# Patient Record
Sex: Male | Born: 1950 | Race: White | Hispanic: No | Marital: Married | Smoking: Former smoker
Health system: Southern US, Community
[De-identification: ages and names within clinical notes are randomized; demographics above are authoritative.]

## PROBLEM LIST (undated history)

## (undated) DIAGNOSIS — Z860100 Personal history of colon polyps, unspecified: Secondary | ICD-10-CM

## (undated) DIAGNOSIS — I10 Essential (primary) hypertension: Secondary | ICD-10-CM

## (undated) DIAGNOSIS — F32A Depression, unspecified: Secondary | ICD-10-CM

## (undated) DIAGNOSIS — M1990 Unspecified osteoarthritis, unspecified site: Secondary | ICD-10-CM

## (undated) DIAGNOSIS — I2510 Atherosclerotic heart disease of native coronary artery without angina pectoris: Secondary | ICD-10-CM

## (undated) DIAGNOSIS — E785 Hyperlipidemia, unspecified: Secondary | ICD-10-CM

## (undated) DIAGNOSIS — F17200 Nicotine dependence, unspecified, uncomplicated: Secondary | ICD-10-CM

## (undated) DIAGNOSIS — J449 Chronic obstructive pulmonary disease, unspecified: Secondary | ICD-10-CM

## (undated) DIAGNOSIS — F329 Major depressive disorder, single episode, unspecified: Secondary | ICD-10-CM

## (undated) DIAGNOSIS — I219 Acute myocardial infarction, unspecified: Secondary | ICD-10-CM

## (undated) DIAGNOSIS — I499 Cardiac arrhythmia, unspecified: Secondary | ICD-10-CM

## (undated) DIAGNOSIS — F419 Anxiety disorder, unspecified: Secondary | ICD-10-CM

## (undated) DIAGNOSIS — N200 Calculus of kidney: Secondary | ICD-10-CM

## (undated) DIAGNOSIS — T7840XA Allergy, unspecified, initial encounter: Secondary | ICD-10-CM

## (undated) DIAGNOSIS — F1010 Alcohol abuse, uncomplicated: Secondary | ICD-10-CM

## (undated) DIAGNOSIS — K219 Gastro-esophageal reflux disease without esophagitis: Secondary | ICD-10-CM

## (undated) DIAGNOSIS — J84112 Idiopathic pulmonary fibrosis: Secondary | ICD-10-CM

## (undated) DIAGNOSIS — R0600 Dyspnea, unspecified: Secondary | ICD-10-CM

## (undated) DIAGNOSIS — Z87442 Personal history of urinary calculi: Secondary | ICD-10-CM

## (undated) DIAGNOSIS — I4891 Unspecified atrial fibrillation: Secondary | ICD-10-CM

## (undated) HISTORY — DX: Personal history of colonic polyps: Z86.010

## (undated) HISTORY — DX: Calculus of kidney: N20.0

## (undated) HISTORY — DX: Hyperlipidemia, unspecified: E78.5

## (undated) HISTORY — DX: Allergy, unspecified, initial encounter: T78.40XA

## (undated) HISTORY — PX: INGUINAL HERNIA REPAIR: SUR1180

## (undated) HISTORY — PX: BACK SURGERY: SHX140

## (undated) HISTORY — DX: Idiopathic pulmonary fibrosis: J84.112

## (undated) HISTORY — DX: Acute myocardial infarction, unspecified: I21.9

## (undated) HISTORY — DX: Personal history of colon polyps, unspecified: Z86.0100

## (undated) HISTORY — DX: Depression, unspecified: F32.A

## (undated) HISTORY — DX: Unspecified osteoarthritis, unspecified site: M19.90

## (undated) HISTORY — PX: CARDIAC ELECTROPHYSIOLOGY STUDY AND ABLATION: SHX1294

## (undated) HISTORY — DX: Atherosclerotic heart disease of native coronary artery without angina pectoris: I25.10

## (undated) HISTORY — DX: Gastro-esophageal reflux disease without esophagitis: K21.9

## (undated) HISTORY — DX: Major depressive disorder, single episode, unspecified: F32.9

## (undated) HISTORY — DX: Anxiety disorder, unspecified: F41.9

## (undated) HISTORY — DX: Chronic obstructive pulmonary disease, unspecified: J44.9

## (undated) HISTORY — DX: Essential (primary) hypertension: I10

## (undated) HISTORY — DX: Unspecified atrial fibrillation: I48.91

---
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
## 2010-03-22 HISTORY — PX: CORONARY STENT PLACEMENT: SHX1402

## 2013-09-03 DIAGNOSIS — M7500 Adhesive capsulitis of unspecified shoulder: Secondary | ICD-10-CM | POA: Insufficient documentation

## 2013-09-03 DIAGNOSIS — F32A Depression, unspecified: Secondary | ICD-10-CM

## 2013-09-03 DIAGNOSIS — E7800 Pure hypercholesterolemia, unspecified: Secondary | ICD-10-CM

## 2013-09-03 DIAGNOSIS — F329 Major depressive disorder, single episode, unspecified: Secondary | ICD-10-CM | POA: Insufficient documentation

## 2013-09-03 DIAGNOSIS — D751 Secondary polycythemia: Secondary | ICD-10-CM | POA: Insufficient documentation

## 2013-09-03 DIAGNOSIS — M542 Cervicalgia: Secondary | ICD-10-CM

## 2013-09-03 DIAGNOSIS — K219 Gastro-esophageal reflux disease without esophagitis: Secondary | ICD-10-CM

## 2013-09-03 DIAGNOSIS — J301 Allergic rhinitis due to pollen: Secondary | ICD-10-CM | POA: Insufficient documentation

## 2013-09-03 DIAGNOSIS — Z01810 Encounter for preprocedural cardiovascular examination: Secondary | ICD-10-CM | POA: Insufficient documentation

## 2013-09-03 DIAGNOSIS — M5030 Other cervical disc degeneration, unspecified cervical region: Secondary | ICD-10-CM | POA: Insufficient documentation

## 2013-09-03 DIAGNOSIS — Z125 Encounter for screening for malignant neoplasm of prostate: Secondary | ICD-10-CM | POA: Insufficient documentation

## 2013-09-03 HISTORY — DX: Allergic rhinitis due to pollen: J30.1

## 2013-09-03 HISTORY — DX: Encounter for screening for malignant neoplasm of prostate: Z12.5

## 2013-09-03 HISTORY — DX: Secondary polycythemia: D75.1

## 2013-09-03 HISTORY — DX: Gastro-esophageal reflux disease without esophagitis: K21.9

## 2013-09-03 HISTORY — DX: Depression, unspecified: F32.A

## 2013-09-03 HISTORY — DX: Pure hypercholesterolemia, unspecified: E78.00

## 2013-09-03 HISTORY — DX: Cervicalgia: M54.2

## 2013-09-03 HISTORY — DX: Other cervical disc degeneration, unspecified cervical region: M50.30

## 2013-09-03 HISTORY — DX: Adhesive capsulitis of unspecified shoulder: M75.00

## 2013-09-03 HISTORY — DX: Major depressive disorder, single episode, unspecified: F32.9

## 2015-03-23 HISTORY — PX: CARDIOVERSION: SHX1299

## 2015-03-23 HISTORY — PX: CATARACT EXTRACTION: SUR2

## 2015-06-26 DIAGNOSIS — R079 Chest pain, unspecified: Secondary | ICD-10-CM

## 2015-06-26 DIAGNOSIS — J449 Chronic obstructive pulmonary disease, unspecified: Secondary | ICD-10-CM | POA: Insufficient documentation

## 2015-06-26 DIAGNOSIS — I4892 Unspecified atrial flutter: Secondary | ICD-10-CM | POA: Insufficient documentation

## 2015-06-26 HISTORY — DX: Chest pain, unspecified: R07.9

## 2015-06-26 HISTORY — DX: Unspecified atrial flutter: I48.92

## 2015-08-05 DIAGNOSIS — G4733 Obstructive sleep apnea (adult) (pediatric): Secondary | ICD-10-CM | POA: Insufficient documentation

## 2015-08-05 HISTORY — DX: Obstructive sleep apnea (adult) (pediatric): G47.33

## 2015-10-21 HISTORY — PX: ATRIAL FIBRILLATION ABLATION: EP1191

## 2015-11-04 DIAGNOSIS — G4761 Periodic limb movement disorder: Secondary | ICD-10-CM

## 2015-11-04 HISTORY — DX: Periodic limb movement disorder: G47.61

## 2016-03-22 HISTORY — PX: VENTRAL HERNIA REPAIR: SHX424

## 2016-03-22 HISTORY — PX: COLONOSCOPY: SHX174

## 2016-04-05 HISTORY — PX: LUMBAR LAMINECTOMY: SHX95

## 2016-07-01 DIAGNOSIS — Z7901 Long term (current) use of anticoagulants: Secondary | ICD-10-CM | POA: Insufficient documentation

## 2016-07-01 DIAGNOSIS — Z9229 Personal history of other drug therapy: Secondary | ICD-10-CM | POA: Insufficient documentation

## 2016-07-01 HISTORY — DX: Personal history of other drug therapy: Z92.29

## 2016-07-01 HISTORY — DX: Long term (current) use of anticoagulants: Z79.01

## 2017-01-27 DIAGNOSIS — R002 Palpitations: Secondary | ICD-10-CM | POA: Insufficient documentation

## 2017-01-27 HISTORY — DX: Palpitations: R00.2

## 2017-10-28 DIAGNOSIS — R42 Dizziness and giddiness: Secondary | ICD-10-CM

## 2017-10-28 DIAGNOSIS — W19XXXA Unspecified fall, initial encounter: Secondary | ICD-10-CM

## 2017-10-28 DIAGNOSIS — R296 Repeated falls: Secondary | ICD-10-CM

## 2017-10-28 HISTORY — DX: Unspecified fall, initial encounter: W19.XXXA

## 2017-10-28 HISTORY — DX: Dizziness and giddiness: R42

## 2017-10-28 HISTORY — DX: Repeated falls: R29.6

## 2018-06-22 ENCOUNTER — Telehealth: Payer: Self-pay

## 2018-06-22 NOTE — Telephone Encounter (Addendum)
Advised patient we are doing Web visits at this time. Patient agrees states he prefers not to come out at this time so a web visit would work fine. Faxed records request to former PCP. Appointment scheduled.

## 2018-06-22 NOTE — Telephone Encounter (Signed)
Copied from [place] [id number]. Topic: General - Other >> Jun 21, 2018 11:14 AM Ivar Drape wrote: Reason for CRM:  Patient would like a new patient appt and he is on the following medications:  1) Lexapro  2) Rexulti  3) Zoloft  4) [place]

## 2018-06-22 NOTE — Telephone Encounter (Signed)
Called patient regarding ne patient appointment. Patient recently moved from [facility] and was followed by Dr. Pia Mau at [facility]. Wife states she would like release of records form mailed to them so that they will not have to come out. Per wife patient has Hx of COPD,and A-Fib. He is not on O2 at this time. Was being seen by Cardiologist in [facility] and will be establishing care with a Cardiologist in this area.

## 2018-06-22 NOTE — Telephone Encounter (Signed)
I can see new pt but has to be virtual via web ex or facetime. If can't do that then need to get established later when pandemic situaion is resolved.Marland Kitchen Hopefully family can get them set up. Also want them to check blood pressure and pulse day of or day before virtual visit.    Explain virtual., bp reading and pulse reading important with new pt and his history.

## 2018-06-26 ENCOUNTER — Telehealth: Payer: Self-pay | Admitting: *Deleted

## 2018-06-26 NOTE — Telephone Encounter (Signed)
Received Medical records from Dr. Jenny Reichmann [facility]; forwarded to provider/SLS 04/06

## 2018-06-26 NOTE — Telephone Encounter (Signed)
Received Medical records from Dr. Jenny Reichmann [facility]; forwarded to provider/SLS 04/06

## 2018-07-03 ENCOUNTER — Encounter: Payer: Self-pay | Admitting: Medical

## 2018-07-03 ENCOUNTER — Other Ambulatory Visit: Payer: Self-pay

## 2018-07-03 ENCOUNTER — Ambulatory Visit (INDEPENDENT_AMBULATORY_CARE_PROVIDER_SITE_OTHER): Payer: Medicare Other | Admitting: Medical

## 2018-07-03 VITALS — BP 141/82 | HR 98

## 2018-07-03 DIAGNOSIS — E785 Hyperlipidemia, unspecified: Secondary | ICD-10-CM

## 2018-07-03 DIAGNOSIS — F17200 Nicotine dependence, unspecified, uncomplicated: Secondary | ICD-10-CM | POA: Diagnosis not present

## 2018-07-03 DIAGNOSIS — J301 Allergic rhinitis due to pollen: Secondary | ICD-10-CM

## 2018-07-03 DIAGNOSIS — Z8679 Personal history of other diseases of the circulatory system: Secondary | ICD-10-CM

## 2018-07-03 DIAGNOSIS — F329 Major depressive disorder, single episode, unspecified: Secondary | ICD-10-CM

## 2018-07-03 DIAGNOSIS — F32A Depression, unspecified: Secondary | ICD-10-CM

## 2018-07-03 NOTE — Patient Instructions (Addendum)
For history of atrial flutter, recommend that you continue with diltiazem.  Also went ahead and made referral to cardiologist.  This referral was placed in a routine manner so I think 3 to 4 months would be reasonable particularly in light of the viral pandemic situation.  For history of smoking, would recommend that you try to cut back.  Unfortunately nicotine patches did not help and you continue despite use of Wellbutrin which oftentimes does help patients to quit smoking.  However keep trying would recommend at least reducing to maybe half a pack a day/tapering down.  For high cholesterol, continue current statin.  Also low-cholesterol diet.  For history of allergic rhinitis, continue with Allegra.  If you find a letter current is not adequate enough then can add steroid nasal spray such as Flonase over-the-counter.  Blood pressure is a little high today.  Would asked that you check your blood pressure and pulse twice daily then MyChart me those readings.  We will see if you need doses change or add on medication.  Follow up date to be determined.

## 2018-07-03 NOTE — Progress Notes (Addendum)
   Subjective:    Patient ID: Robert Lang, male    DOB: 02/08/1951, 68 y.o.   MRN: [id number]  HPI  Virtual Visit via Video Note  I connected with [name] on 07/03/18 at  9:20 AM EDT by a video enabled telemedicine application and verified that I am speaking with the correct person using two identifiers.   I discussed the limitations of evaluation and management by telemedicine and the availability of in person appointments. The patient expressed understanding and agreed to proceed.   History of Present Illness:   Pt states trying to get some daily exercise but some balance issues. Pt smokes a pack a day. Stopped 10 years and then restarted back. 2 beers a day. Relocated to get close to children.  Pt has hx of high cholesterol in past. He states his last labs were controlled 2 months ago.  Pt has hx of atrial  flutter in the past. Hx of ablation in August 2017. Pt was seeing Dr. Pia Mau [facility].  Pt is has hx of depression. On wellbutrin 300 mg daily. Pt is on lexapro 20 mg daily. Not on cymblata. Pt is no longer on clonzepam.  Hx of allergies this time of year. He takes Human resources officer. He states controlls his allergies.          Observations/Objective: General-no acute distress, pleasant, oriented. Lungs- on inspection lungs appear unlabored. Neck- no tracheal deviation or jvd on inspection. Neuro- gross motor function appears intact.  Assessment and Plan: For history of atrial flutter, recommend that you continue with diltiazem.  Also went ahead and made referral to cardiologist.  This referral was placed in a routine manner so I think 3 to 4 months would be reasonable particularly in light of the viral pandemic situation.  For history of smoking, would recommend that you try to cut back.  Unfortunately nicotine patches did not help and you continue despite use of Wellbutrin which oftentimes does help patients to quit smoking.  However keep trying would  recommend at least reducing to maybe half a pack a day/tapering down.  For high cholesterol, continue current statin.  Also low-cholesterol diet.  For history of allergic rhinitis, continue with Allegra.  If you find a letter current is not adequate enough then can add steroid nasal spray such as Flonase over-the-counter.  Blood pressure is a little high today.  Would asked that you check your blood pressure and pulse twice daily then MyChart me those readings.  We will see if you need doses change or add on medication.   Follow up date to be determined. Follow Up Instructions:    I discussed the assessment and treatment plan with the patient. The patient was provided an opportunity to ask questions and all were answered. The patient agreed with the plan and demonstrated an understanding of the instructions.   The patient was advised to call back or seek an in-person evaluation if the symptoms worsen or if the condition fails to improve as anticipated.  I provided 30 minutes of non-face-to-face time during this encounter.   Mackie Pai, [name]    Review of Systems     Objective:   Physical Exam       Assessment & Plan:

## 2018-07-04 ENCOUNTER — Telehealth: Payer: Self-pay | Admitting: Cardiology

## 2018-07-04 NOTE — Telephone Encounter (Signed)
YOUR CARDIOLOGY TEAM HAS ARRANGED FOR AN E-VISIT FOR YOUR APPOINTMENT - PLEASE REVIEW IMPORTANT INFORMATION BELOW SEVERAL DAYS PRIOR TO YOUR APPOINTMENT  Due to the recent COVID-19 pandemic, we are transitioning in-person office visits to tele-medicine visits in an effort to decrease unnecessary exposure to our patients and staff. Medicare and most insurances are covering these visits without a copay needed. We also encourage you to sign up for MyChart if you have not already done so. You will need a smartphone if possible. For patients that do not have this, we can still complete the visit using a regular telephone but do prefer a smartphone to enable video when possible. You may have a close family member that lives with you that can help. If possible, we also ask that you have a blood pressure cuff and scale at home to measure your blood pressure, heart rate and weight prior to your scheduled appointment. Patients with clinical needs that need an in-person evaluation and testing will still be able to come to the office if absolutely necessary. If you have any questions, feel free to call our office.    CONSENT FOR TELE-HEALTH VISIT - PLEASE REVIEW  I hereby voluntarily request, consent and authorize [facility] and its employed or contracted physicians, physician assistants, nurse practitioners or other licensed health care professionals (the Practitioner), to provide me with telemedicine health care services (the Services") as deemed necessary by the treating Practitioner. I acknowledge and consent to receive the Services by the Practitioner via telemedicine. I understand that the telemedicine visit will involve communicating with the Practitioner through live audiovisual communication technology and the disclosure of certain medical information by electronic transmission. I acknowledge that I have been given the opportunity to request an in-person assessment or other available alternative prior to  the telemedicine visit and am voluntarily participating in the telemedicine visit.  I understand that I have the right to withhold or withdraw my consent to the use of telemedicine in the course of my care at any time, without affecting my right to future care or treatment, and that the Practitioner or I may terminate the telemedicine visit at any time. I understand that I have the right to inspect all information obtained and/or recorded in the course of the telemedicine visit and may receive copies of available information for a reasonable fee.  I understand that some of the potential risks of receiving the Services via telemedicine include:   Delay or interruption in medical evaluation due to technological equipment failure or disruption;  Information transmitted may not be sufficient (e.g. poor resolution of images) to allow for appropriate medical decision making by the Practitioner; and/or   In rare instances, security protocols could fail, causing a breach of personal health information.  Furthermore, I acknowledge that it is my responsibility to provide information about my medical history, conditions and care that is complete and accurate to the best of my ability. I acknowledge that Practitioner's advice, recommendations, and/or decision may be based on factors not within their control, such as incomplete or inaccurate data provided by me or distortions of diagnostic images or specimens that may result from electronic transmissions. I understand that the practice of medicine is not an exact science and that Practitioner makes no warranties or guarantees regarding treatment outcomes. I acknowledge that I will receive a copy of this consent concurrently upon execution via email to the email address I last provided but may also request a printed copy by calling the office of [place].  I understand that my insurance will be billed for this visit.   I have read or had this consent read to  me.  I understand the contents of this consent, which adequately explains the benefits and risks of the Services being provided via telemedicine.   I have been provided ample opportunity to ask questions regarding this consent and the Services and have had my questions answered to my satisfaction.  I give my informed consent for the services to be provided through the use of telemedicine in my medical care  By participating in this telemedicine visit I agree to the above.  Patient agrees to verbal consent for [email or web address].me meeting 07/04/2018 pp

## 2018-07-06 ENCOUNTER — Encounter: Payer: Self-pay | Admitting: Cardiology

## 2018-07-06 ENCOUNTER — Encounter: Payer: Self-pay | Admitting: Medical

## 2018-07-06 ENCOUNTER — Other Ambulatory Visit: Payer: Self-pay

## 2018-07-06 ENCOUNTER — Telehealth (INDEPENDENT_AMBULATORY_CARE_PROVIDER_SITE_OTHER): Payer: Medicare Other | Admitting: Cardiology

## 2018-07-06 DIAGNOSIS — Z9889 Other specified postprocedural states: Principal | ICD-10-CM

## 2018-07-06 DIAGNOSIS — G629 Polyneuropathy, unspecified: Secondary | ICD-10-CM

## 2018-07-06 DIAGNOSIS — I10 Essential (primary) hypertension: Secondary | ICD-10-CM

## 2018-07-06 DIAGNOSIS — I25110 Atherosclerotic heart disease of native coronary artery with unstable angina pectoris: Secondary | ICD-10-CM | POA: Insufficient documentation

## 2018-07-06 DIAGNOSIS — E785 Hyperlipidemia, unspecified: Secondary | ICD-10-CM

## 2018-07-06 DIAGNOSIS — F17210 Nicotine dependence, cigarettes, uncomplicated: Secondary | ICD-10-CM

## 2018-07-06 DIAGNOSIS — F17200 Nicotine dependence, unspecified, uncomplicated: Secondary | ICD-10-CM

## 2018-07-06 DIAGNOSIS — I2510 Atherosclerotic heart disease of native coronary artery without angina pectoris: Secondary | ICD-10-CM

## 2018-07-06 DIAGNOSIS — Z8679 Personal history of other diseases of the circulatory system: Secondary | ICD-10-CM

## 2018-07-06 HISTORY — DX: Personal history of other diseases of the circulatory system: Z86.79

## 2018-07-06 HISTORY — DX: Hyperlipidemia, unspecified: E78.5

## 2018-07-06 HISTORY — DX: Nicotine dependence, unspecified, uncomplicated: F17.200

## 2018-07-06 HISTORY — DX: Essential (primary) hypertension: I10

## 2018-07-06 HISTORY — DX: Polyneuropathy, unspecified: G62.9

## 2018-07-06 HISTORY — DX: Other specified postprocedural states: Z98.890

## 2018-07-06 HISTORY — DX: Atherosclerotic heart disease of native coronary artery without angina pectoris: I25.10

## 2018-07-06 NOTE — Addendum Note (Signed)
Addended by: Ashok Norris on: 07/06/2018 02:56 PM   Modules accepted: Orders

## 2018-07-06 NOTE — Progress Notes (Signed)
Virtual Visit via Video Note   This visit type was conducted due to national recommendations for restrictions regarding the COVID-19 Pandemic (e.g. social distancing) in an effort to limit this patient's exposure and mitigate transmission in our community.  Due to his co-morbid illnesses, this patient is at least at moderate risk for complications without adequate follow up.  This format is felt to be most appropriate for this patient at this time.  All issues noted in this document were discussed and addressed.  A limited physical exam was performed with this format.  Please refer to the patient's chart for his consent to telehealth for [facility].  Evaluation Performed:  Follow-up visit  This visit type was conducted due to national recommendations for restrictions regarding the COVID-19 Pandemic (e.g. social distancing).  This format is felt to be most appropriate for this patient at this time.  All issues noted in this document were discussed and addressed.  No physical exam was performed (except for noted visual exam findings with Video Visits).  Please refer to the patient's chart (MyChart message for video visits and phone note for telephone visits) for the patient's consent to telehealth for [facility].  Date:  07/06/2018  ID: Robert Lang, DOB 12-May-1950, MRN [id number]   Patient Location:  [place] [address]   Provider location:   [place] Office  PCP:  Mackie Pai, [place]  Cardiologist:  Jenne Campus, MD     Chief Complaint: I would like to be established as a patient  History of Present Illness:    Robert Lang is a 68 y.o. male  who presents via audio/video conferencing for a telehealth visit today.  His past medical history significant for atrial flutter.  He was asymptomatic when he got flutter his symptoms were fatigue and tiredness.  Eventually in 2017 he had atrial flutter ablation.  He has been anticoagulated  since that time.  He did wear some monitors after that which did not show any evidence of atrial flutter or atrial fibrillation.  At that time he had cardiac catheterization.  He did not have any chest pain tightness squeezing pressure burning chest it was done as part of evaluation before he is atrial flutter ablation.  He is doing well overall he is able to walk climb stairs with no major difficulties he is trying to walk on a regular basis he does it 3 times a day.  He gets somewhat tired however no unusual symptoms.  No chest pain while exercising.  Sadly he continues to smoke apparently quit for 10 years and then he went back to smoking he also drink about 3-4 beers every single day.  He does not have family history of premature coronary artery disease he does have hypertension no diabetes he does have neuropathy origin of which is unclear.  He is taking some medication which helped to some degree but he got difficulty sleeping at night because of neuropathy.  Does not have history of CVA or TIA.   The patient does not have symptoms concerning for COVID-19 infection (fever, chills, cough, or new SHORTNESS OF BREATH).    Prior CV studies:   The following studies were reviewed today:  Cardiac catheterization from 2017 reviewed which showed 90% narrowing of the small diagonal branch.  Rest show luminal irregularities.     Past Medical History:  Diagnosis Date  . Allergy   . Arthritis   . Depression   . Hyperlipidemia   .  Hypertension     Past Surgical History:  Procedure Laterality Date  . CARDIAC ELECTROPHYSIOLOGY STUDY AND ABLATION       Current Meds  Medication Sig  . atorvastatin (LIPITOR) 10 MG tablet Take by mouth.  Marland Kitchen buPROPion (WELLBUTRIN) 100 MG tablet Take by mouth.  . Cholecalciferol (VITAMIN D-1000 MAX ST) 25 MCG (1000 UT) tablet Take by mouth.  . clonazePAM (KLONOPIN) 0.5 MG tablet TK 1 T PO TID PRN  . Difluprednate (DUREZOL) 0.05 % EMUL   . diltiazem (CARDIZEM CD)  180 MG 24 hr capsule Take by mouth.  . DULoxetine (CYMBALTA) 60 MG capsule TK 1 C PO D  . enalapril (VASOTEC) 10 MG tablet Take by mouth.  . enalapril-hydrochlorothiazide (VASERETIC) 10-25 MG tablet Take by mouth.  . escitalopram (LEXAPRO) 10 MG tablet Take by mouth.  . fexofenadine (ALLEGRA) 180 MG tablet Take by mouth.  . finasteride (PROSCAR) 5 MG tablet TK 1 T PO QD  . gabapentin (NEURONTIN) 100 MG capsule Take by mouth.  Marland Kitchen ketorolac (ACULAR) 0.5 % ophthalmic solution   . Melatonin (MELATONIN MAXIMUM STRENGTH) 5 MG TABS Take by mouth.  . meloxicam (MOBIC) 7.5 MG tablet Take by mouth.  . mirabegron ER (MYRBETRIQ) 25 MG TB24 tablet Take by mouth.  . nitroGLYCERIN (NITROSTAT) 0.4 MG SL tablet Place under the tongue.  Marland Kitchen REXULTI 1 MG TABS TAKE 1 TABLET PO QHS  . rivaroxaban (XARELTO) 20 MG TABS tablet Take by mouth.  . sertraline (ZOLOFT) 100 MG tablet       Family History: The patient's family history is not on file.   ROS:   Please see the history of present illness.     All other systems reviewed and are negative.   Labs/Other Tests and Data Reviewed:     Recent Labs: No results found for requested labs within last 8760 hours.  Recent Lipid Panel No results found for: CHOL, TRIG, HDL, CHOLHDL, VLDL, LDLCALC, LDLDIRECT    Exam:    Vital Signs:  BP 138/78   Pulse 74   Ht 5\' 11"  (1.803 m)   Wt 190 lb (86.2 kg)   BMI 26.50 kg/m     Wt Readings from Last 3 Encounters:  07/06/18 190 lb (86.2 kg)     Well nourished, well developed male in no acute distress. Alert awake oriented x3.  He is talking to me from his townhouse that he just recently moved into.  He is denies having any issues there is no JVD.  There is no swelling of lower extremities he is quite cheerful and actually enjoyed conversation with him a lot.  We used video link for this visit.  Diagnosis for this visit:   1. Status post ablation of atrial flutter   2. Coronary artery disease involving  native coronary artery of native heart without angina pectoris   3. Essential hypertension   4. Dyslipidemia   5. Smoking   6. Neuropathy      ASSESSMENT & PLAN:    1.  Status post ablation of atrial flutter denies having any palpitations recent monitor was done showed no evidence of recurrence of the problem.  Still because of his high chads 2 Vascor he is anticoagulated which I will continue. 2.  Coronary artery disease stable on appropriate medication I told him we need to modify his risk factors for coronary artery disease.  We will get in touch with his primary care physician to get his fasting lipid profile.  I stressed importance  of quitting smoking. 3.  Essential hypertension his blood pressure usually is between 1 44-3 40 systolic.  I told him this is appropriate he described 1 situation that his blood pressure dropped to 101/60 I told him if that happens again he need to take some extra fluids.  Other than that we will continue present management. 4.  Smoking we had a long discussion with him regarding that issue and I strongly recommend to quit. 5.  Neuropathy.  Is being managed by primary care physician.  Appears to be fairly controlled. 6.  Dyslipidemia we will continue with statin right now will get copy of fasting lipid profile from his primary care physician. 7.  High risk for atherosclerosis everywhere.  I will schedule him to have carotid ultrasounds to rule out possibility of problem there.  COVID-19 Education: The signs and symptoms of COVID-19 were discussed with the patient and how to seek care for testing (follow up with PCP or arrange E-visit).  The importance of social distancing was discussed today.  Patient Risk:   After full review of this patients clinical status, I feel that they are at least moderate risk at this time.  Time:   Today, I have spent 25 minutes with the patient with telehealth technology discussing pt health issues. Visit was finished at 2:44 PM.     Medication Adjustments/Labs and Tests Ordered: Current medicines are reviewed at length with the patient today.  Concerns regarding medicines are outlined above.  No orders of the defined types were placed in this encounter.  Medication changes: No orders of the defined types were placed in this encounter.    Disposition: 3 months follow-up in the meantime we will schedule him to have carotid ultrasounds.  Daily level 2 as well as echocardiogram with acuity level 3  Signed, Park Liter, MD, [facility] 07/06/2018 2:42 PM    [facility]

## 2018-07-06 NOTE — Patient Instructions (Signed)
Medication Instructions:  Your physician recommends that you continue on your current medications as directed. Please refer to the Current Medication list given to you today.  If you need a refill on your cardiac medications before your next appointment, please call your pharmacy.   Lab work: None.  If you have labs (blood work) drawn today and your tests are completely normal, you will receive your results only by: Marland Kitchen MyChart Message (if you have MyChart) OR . A paper copy in the mail If you have any lab test that is abnormal or we need to change your treatment, we will call you to review the results.  Testing/Procedures: Your physician has requested that you have a carotid duplex. This test is an ultrasound of the carotid arteries in your neck. It looks at blood flow through these arteries that supply the brain with blood. Allow one hour for this exam. There are no restrictions or special instructions.  Your physician has requested that you have an echocardiogram. Echocardiography is a painless test that uses sound waves to create images of your heart. It provides your doctor with information about the size and shape of your heart and how well your heart's chambers and valves are working. This procedure takes approximately one hour. There are no restrictions for this procedure.    Follow-Up: At [facility], you and your health needs are our priority.  As part of our continuing mission to provide you with exceptional heart care, we have created designated Provider Care Teams.  These Care Teams include your primary Cardiologist (physician) and Advanced Practice Providers (APPs -  Physician Assistants and Nurse Practitioners) who all work together to provide you with the care you need, when you need it. You will need a follow up appointment in 3 months.  Please call our office 2 months in advance to schedule this appointment.  You may see No primary care provider on file. or another member of  our [employer] Provider Team in [place]: Shirlee More, MD . Jyl Heinz, MD  Any Other Special Instructions Will Be Listed Below (If Applicable).     Echocardiogram An echocardiogram is a procedure that uses painless sound waves (ultrasound) to produce an image of the heart. Images from an echocardiogram can provide important information about:  Signs of coronary artery disease (CAD).  Aneurysm detection. An aneurysm is a weak or damaged part of an artery wall that bulges out from the normal force of blood pumping through the body.  Heart size and shape. Changes in the size or shape of the heart can be associated with certain conditions, including heart failure, aneurysm, and CAD.  Heart muscle function.  Heart valve function.  Signs of a past heart attack.  Fluid buildup around the heart.  Thickening of the heart muscle.  A tumor or infectious growth around the heart valves. Tell a health care provider about:  Any allergies you have.  All medicines you are taking, including vitamins, herbs, eye drops, creams, and over-the-counter medicines.  Any blood disorders you have.  Any surgeries you have had.  Any medical conditions you have.  Whether you are pregnant or may be pregnant. What are the risks? Generally, this is a safe procedure. However, problems may occur, including:  Allergic reaction to dye (contrast) that may be used during the procedure. What happens before the procedure? No specific preparation is needed. You may eat and drink normally. What happens during the procedure?   An IV tube may be inserted into  one of your veins.  You may receive contrast through this tube. A contrast is an injection that improves the quality of the pictures from your heart.  A gel will be applied to your chest.  A wand-like tool (transducer) will be moved over your chest. The gel will help to transmit the sound waves from the transducer.  The sound waves will  harmlessly bounce off of your heart to allow the heart images to be captured in real-time motion. The images will be recorded on a computer. The procedure may vary among health care providers and hospitals. What happens after the procedure?  You may return to your normal, everyday life, including diet, activities, and medicines, unless your health care provider tells you not to do that. Summary  An echocardiogram is a procedure that uses painless sound waves (ultrasound) to produce an image of the heart.  Images from an echocardiogram can provide important information about the size and shape of your heart, heart muscle function, heart valve function, and fluid buildup around your heart.  You do not need to do anything to prepare before this procedure. You may eat and drink normally.  After the echocardiogram is completed, you may return to your normal, everyday life, unless your health care provider tells you not to do that. This information is not intended to replace advice given to you by your health care provider. Make sure you discuss any questions you have with your health care provider. Document Released: 03/05/2000 Document Revised: 04/10/2016 Document Reviewed: 04/10/2016 Elsevier Interactive Patient Education  2019 [employer].

## 2018-07-26 ENCOUNTER — Encounter: Payer: Self-pay | Admitting: Medical

## 2018-08-11 ENCOUNTER — Encounter: Payer: Self-pay | Admitting: Medical

## 2018-08-15 ENCOUNTER — Telehealth: Payer: Self-pay | Admitting: Medical

## 2018-08-15 NOTE — Telephone Encounter (Signed)
Pt needs virtual visit in one week.

## 2018-08-16 NOTE — Telephone Encounter (Signed)
Pt schedule 08-23-2018. Done

## 2018-08-23 ENCOUNTER — Ambulatory Visit (INDEPENDENT_AMBULATORY_CARE_PROVIDER_SITE_OTHER): Payer: Medicare Other | Admitting: Medical

## 2018-08-23 ENCOUNTER — Other Ambulatory Visit: Payer: Self-pay

## 2018-08-23 ENCOUNTER — Encounter: Payer: Self-pay | Admitting: Medical

## 2018-08-23 VITALS — BP 133/88 | HR 102 | Ht 71.0 in | Wt 185.0 lb

## 2018-08-23 DIAGNOSIS — Z8679 Personal history of other diseases of the circulatory system: Secondary | ICD-10-CM | POA: Diagnosis not present

## 2018-08-23 DIAGNOSIS — F17200 Nicotine dependence, unspecified, uncomplicated: Secondary | ICD-10-CM | POA: Diagnosis not present

## 2018-08-23 DIAGNOSIS — F329 Major depressive disorder, single episode, unspecified: Secondary | ICD-10-CM

## 2018-08-23 DIAGNOSIS — E785 Hyperlipidemia, unspecified: Secondary | ICD-10-CM

## 2018-08-23 DIAGNOSIS — I2510 Atherosclerotic heart disease of native coronary artery without angina pectoris: Secondary | ICD-10-CM

## 2018-08-23 DIAGNOSIS — Z87438 Personal history of other diseases of male genital organs: Secondary | ICD-10-CM

## 2018-08-23 DIAGNOSIS — F32A Depression, unspecified: Secondary | ICD-10-CM

## 2018-08-23 MED ORDER — FINASTERIDE 5 MG PO TABS: ORAL_TABLET | ORAL | 1 refills | Status: DC

## 2018-08-23 MED ORDER — BUPROPION HCL ER (SR) 100 MG PO TB12: 100.0000 mg | ORAL_TABLET | Freq: Every day | ORAL | 1 refills | Status: DC

## 2018-08-23 NOTE — Patient Instructions (Addendum)
For high cholesterol, I put in cmp and lipid panel order. Please get labs done within the next week. Will refill lipid med after reviewing the result.  Please continue to try to quit smoking.   For hx of atrial flutter continue dilltiazem and xarelto. Follow up with cardiologist and get echo as ordered in epic.   For depression continue med regimen advised by psychiatrist. Will refill your wellbutrin.  For  Bph, I am refilling your finasteride.  Mackie Pai, [name]

## 2018-08-23 NOTE — Progress Notes (Signed)
Subjective:    Patient ID: Robert Lang, male    DOB: 06/17/1950, 68 y.o.   MRN: [id number]  HPI  Virtual Visit via Video Note  I connected with [name] on 08/23/18 at  8:00 AM EDT by a video enabled telemedicine application and verified that I am speaking with the correct person using two identifiers.  Location: Patient: home Provider: home.     I discussed the limitations of evaluation and management by telemedicine and the availability of in person appointments. The patient expressed understanding and agreed to proceed.  History of Present Illness: Pt states his mood is well controlled presently with current meds. Will rx wellbutrin refill. Pt states his new psychiatrist stopping lexapro and starting sertrlaine. Pt also stopping rexulti.   For high cholesterol will get cmp and lipid panel within next week.  For atrial flutter, continue diltiazem and xarelto.  Pt has hx of bph and needs refill of finasteride.      Observations/Objective:  General-no acute distress, pleasant, oriented. Lungs- on inspection lungs appear unlabored. Neck- no tracheal deviation or jvd on inspection. Neuro- gross motor function appears intact.     Assessment and Plan: For high cholesterol, I put in cmp and lipid panel order. Please get labs done within the next week. Will refill lipid med after reviewing the result.  Please continue to try to quit smoking.   For hx of atrial flutter continue dilltiazem and xarelto. Follow up with cardiologist and get echo as ordered in epic.   For depression continue med regimen advised by psychiatrist. Will refill your wellbutrin.  For  Bph, I am refilling your finasteride.  Follow Up Instructions:    I discussed the assessment and treatment plan with the patient. The patient was provided an opportunity to ask questions and all were answered. The patient agreed with the plan and demonstrated an understanding of the instructions.    The patient was advised to call back or seek an in-person evaluation if the symptoms worsen or if the condition fails to improve as anticipated.     Mackie Pai, [name]   I discussed the assessment and treatment plan with the patient. The patient was provided an opportunity to ask questions and all were answered. The patient agreed with the plan and demonstrated an understanding of the instructions.   The patient was advised to call back or seek an in-person evaluation if the symptoms worsen or if the condition fails to improve as anticipated.    Mackie Pai, [name]    Review of Systems  Constitutional: Negative for chills, fatigue and fever.  Respiratory: Negative for chest tightness, shortness of breath and wheezing.   Cardiovascular: Negative for chest pain and palpitations.  Gastrointestinal: Negative for abdominal distention, abdominal pain and blood in stool.  Genitourinary: Negative for dysuria, penile pain and urgency.       Hx of bph.  Musculoskeletal: Negative for back pain.  Skin: Negative for rash.  Neurological: Negative for dizziness, seizures, syncope, weakness and headaches.  Hematological: Negative for adenopathy. Does not bruise/bleed easily.  Psychiatric/Behavioral: Negative for behavioral problems, confusion, dysphoric mood, sleep disturbance and suicidal ideas. The patient is not nervous/anxious.    Past Medical History:  Diagnosis Date  . Allergy   . Arthritis   . Depression   . Hyperlipidemia   . Hypertension      Social History   Socioeconomic History  . Marital status: Married    Spouse name: Not on file  . Number of  children: Not on file  . Years of education: Not on file  . Highest education level: Not on file  Occupational History  . Not on file  Social Needs  . Financial resource strain: Not on file  . Food insecurity:    Worry: Not on file    Inability: Not on file  . Transportation needs:    Medical: Not on file     Non-medical: Not on file  Tobacco Use  . Smoking status: Current Every Day Smoker    Packs/day: 1.00    Years: 30.00    Pack years: 30.00  . Smokeless tobacco: Never Used  Substance and Sexual Activity  . Alcohol use: Yes    Alcohol/week: 2.0 standard drinks    Types: 2 Cans of beer per week    Comment: 2 beers a day.  . Drug use: Never  . Sexual activity: Not on file  Lifestyle  . Physical activity:    Days per week: Not on file    Minutes per session: Not on file  . Stress: Not on file  Relationships  . Social connections:    Talks on phone: Not on file    Gets together: Not on file    Attends religious service: Not on file    Active member of club or organization: Not on file    Attends meetings of clubs or organizations: Not on file    Relationship status: Not on file  . Intimate partner violence:    Fear of current or ex partner: Not on file    Emotionally abused: Not on file    Physically abused: Not on file    Forced sexual activity: Not on file  Other Topics Concern  . Not on file  Social History Narrative  . Not on file    Past Surgical History:  Procedure Laterality Date  . CARDIAC ELECTROPHYSIOLOGY STUDY AND ABLATION      No family history on file.  Allergies  Allergen Reactions  . Naproxen Other (See Comments)    (Naprosyn *ANALGESICS - ANTI-INFLAMMATORY*) Nausea, Abdominal pain    Current Outpatient Medications on File Prior to Visit  Medication Sig Dispense Refill  . atorvastatin (LIPITOR) 10 MG tablet Take by mouth.    Marland Kitchen buPROPion (WELLBUTRIN) 100 MG tablet Take by mouth.    . Cholecalciferol (VITAMIN D-1000 MAX ST) 25 MCG (1000 UT) tablet Take by mouth.    . clonazePAM (KLONOPIN) 0.5 MG tablet TK 1 T PO TID PRN    . Difluprednate (DUREZOL) 0.05 % EMUL     . diltiazem (CARDIZEM CD) 180 MG 24 hr capsule Take by mouth.    . DULoxetine (CYMBALTA) 60 MG capsule TK 1 C PO D    . enalapril (VASOTEC) 10 MG tablet Take by mouth.    .  enalapril-hydrochlorothiazide (VASERETIC) 10-25 MG tablet Take by mouth.    . escitalopram (LEXAPRO) 10 MG tablet Take by mouth.    . fexofenadine (ALLEGRA) 180 MG tablet Take by mouth.    . finasteride (PROSCAR) 5 MG tablet TK 1 T PO QD    . gabapentin (NEURONTIN) 100 MG capsule Take by mouth.    Marland Kitchen ketorolac (ACULAR) 0.5 % ophthalmic solution     . Melatonin (MELATONIN MAXIMUM STRENGTH) 5 MG TABS Take by mouth.    . meloxicam (MOBIC) 7.5 MG tablet Take by mouth.    . mirabegron ER (MYRBETRIQ) 25 MG TB24 tablet Take by mouth.    . nitroGLYCERIN (NITROSTAT) 0.4  MG SL tablet Place under the tongue.    Marland Kitchen REXULTI 1 MG TABS TAKE 1 TABLET PO QHS    . rivaroxaban (XARELTO) 20 MG TABS tablet Take by mouth.    . sertraline (ZOLOFT) 100 MG tablet      No current facility-administered medications on file prior to visit.     BP 133/88   Pulse (!) 102   Ht 5\' 11"  (1.803 m)   Wt 185 lb (83.9 kg)   BMI 25.80 kg/m       Objective:   Physical Exam        Assessment & Plan:

## 2018-08-25 ENCOUNTER — Telehealth: Payer: Self-pay

## 2018-08-25 MED ORDER — BUPROPION HCL ER (XL) 300 MG PO TB24: 300.0000 mg | ORAL_TABLET | Freq: Every day | ORAL | 0 refills | Status: DC

## 2018-08-25 NOTE — Telephone Encounter (Signed)
On his historical medication list it stated 100 mg tab for wellbutrin historical dose(and he confirmed this was the dose??. I meant to write extended release version. He mentioned nothing to me that takes 3 tabs a day. Or 300 mg dose   If he already got that filled advised can take 2 tab in am and one tab in pm.  There is 300 mg extended release version that he takes once a day. I will send that in but they probably won't refill this immediately. Until he runs out of current. I put a note on rx explaining to pharmacy why he will run out early.  Will you make sure that went electronically and did not print.

## 2018-08-25 NOTE — Telephone Encounter (Signed)
Pt already filled medication. Notified pt to take 2 in am and on in pm and we will send refill in for 300mg  when current rx runs out.

## 2018-08-25 NOTE — Telephone Encounter (Signed)
Copied from [place] [phone]. Topic: General - Other >> Aug 24, 2018  9:07 AM Pauline Good wrote: Reason for CRM: pt stated  he usually get 300mg  of buPROPion (WELLBUTRIN SR) 100 MG 12 hr tablet but when he went to pharmacy it was 100mg . Pt want to know if he need to take 3. Please call pt

## 2018-09-06 ENCOUNTER — Telehealth: Payer: Self-pay | Admitting: Medical

## 2018-09-06 ENCOUNTER — Ambulatory Visit (INDEPENDENT_AMBULATORY_CARE_PROVIDER_SITE_OTHER): Payer: Medicare Other | Admitting: Medical

## 2018-09-06 ENCOUNTER — Other Ambulatory Visit (INDEPENDENT_AMBULATORY_CARE_PROVIDER_SITE_OTHER): Payer: Medicare Other

## 2018-09-06 ENCOUNTER — Other Ambulatory Visit: Payer: Self-pay

## 2018-09-06 DIAGNOSIS — I10 Essential (primary) hypertension: Secondary | ICD-10-CM | POA: Diagnosis not present

## 2018-09-06 DIAGNOSIS — E785 Hyperlipidemia, unspecified: Secondary | ICD-10-CM | POA: Diagnosis not present

## 2018-09-06 DIAGNOSIS — N401 Enlarged prostate with lower urinary tract symptoms: Secondary | ICD-10-CM

## 2018-09-06 DIAGNOSIS — R350 Frequency of micturition: Secondary | ICD-10-CM | POA: Diagnosis not present

## 2018-09-06 DIAGNOSIS — R3911 Hesitancy of micturition: Secondary | ICD-10-CM | POA: Diagnosis not present

## 2018-09-06 DIAGNOSIS — I2510 Atherosclerotic heart disease of native coronary artery without angina pectoris: Secondary | ICD-10-CM | POA: Diagnosis not present

## 2018-09-06 LAB — COMPREHENSIVE METABOLIC PANEL
ALT: 29 U/L (ref 0–53)
AST: 19 U/L (ref 0–37)
Albumin: 4.3 g/dL (ref 3.5–5.2)
Alkaline Phosphatase: 80 U/L (ref 39–117)
BUN: 17 mg/dL (ref 6–23)
CO2: 26 mEq/L (ref 19–32)
Calcium: 9.7 mg/dL (ref 8.4–10.5)
Chloride: 99 mEq/L (ref 96–112)
Creatinine, Ser: 0.87 mg/dL (ref 0.40–1.50)
GFR: 87.15 mL/min (ref >60.00)
Glucose, Bld: 71 mg/dL (ref 70–99)
Potassium: 4.6 mEq/L (ref 3.5–5.1)
Sodium: 134 mEq/L — ABNORMAL LOW (ref 135–145)
Total Bilirubin: 0.6 mg/dL (ref 0.2–1.2)
Total Protein: 6.9 g/dL (ref 6.0–8.3)

## 2018-09-06 LAB — LDL CHOLESTEROL, DIRECT: Direct LDL: 66 mg/dL

## 2018-09-06 LAB — POC URINALSYSI DIPSTICK (AUTOMATED)
Bilirubin, UA: NEGATIVE
Blood, UA: NEGATIVE
Glucose, UA: NEGATIVE
Leukocytes, UA: NEGATIVE
Nitrite, UA: NEGATIVE
Protein, UA: POSITIVE — AB
Spec Grav, UA: 1.015 (ref 1.010–1.025)
Urobilinogen, UA: NEGATIVE E.U./dL — AB
pH, UA: 6 (ref 5.0–8.0)

## 2018-09-06 LAB — LIPID PANEL
Cholesterol: 124 mg/dL (ref 0–200)
HDL: 40.1 mg/dL (ref >39.00)
NonHDL: 83.42
Total CHOL/HDL Ratio: 3
Triglycerides: 203 mg/dL — ABNORMAL HIGH (ref 0.0–149.0)
VLDL: 40.6 mg/dL — ABNORMAL HIGH (ref 0.0–40.0)

## 2018-09-06 LAB — PSA: PSA: 0.82 ng/mL (ref 0.10–4.00)

## 2018-09-06 MED ORDER — TAMSULOSIN HCL 0.4 MG PO CAPS: 0.4000 mg | ORAL_CAPSULE | Freq: Every day | ORAL | 0 refills | Status: DC

## 2018-09-06 MED ORDER — ENALAPRIL MALEATE 5 MG PO TABS: 5.0000 mg | ORAL_TABLET | Freq: Every day | ORAL | 0 refills | Status: DC

## 2018-09-06 NOTE — Telephone Encounter (Signed)
Rx enalapril sent to pt pharmacy.

## 2018-09-06 NOTE — Progress Notes (Signed)
Subjective:    Patient ID: Robert Lang, male    DOB: 04/26/1950, 68 y.o.   MRN: [id number]  HPI Pt in for office visit.  Pt has hx of bph. Pt has seen urologist at former office. He saw them for one year. Pt states cystoscopy was normal. Pt states he bph and frequent urination with hesitant flow. No fever, no chills or sweats.   Pt state former urologist might have considered turp. He needs new urologist.  Pt is on proscar,   He has htn history and well as atrial flutter.    Review of Systems  Constitutional: Negative for chills, fatigue and fever.  Respiratory: Negative for cough, chest tightness and wheezing.   Cardiovascular: Negative for chest pain and palpitations.  Gastrointestinal: Negative for abdominal distention and abdominal pain.  Genitourinary: Positive for difficulty urinating, frequency and urgency. Negative for dysuria, genital sores, hematuria, penile pain and testicular pain.  Musculoskeletal: Negative for back pain, neck pain and neck stiffness.  Skin: Negative for rash.  Neurological: Negative for dizziness, syncope, weakness, numbness and headaches.  Hematological: Negative for adenopathy. Does not bruise/bleed easily.  Psychiatric/Behavioral: Negative for behavioral problems and confusion.   Past Medical History:  Diagnosis Date  . Allergy   . Arthritis   . Depression   . Hyperlipidemia   . Hypertension      Social History   Socioeconomic History  . Marital status: Married    Spouse name: Not on file  . Number of children: Not on file  . Years of education: Not on file  . Highest education level: Not on file  Occupational History  . Not on file  Social Needs  . Financial resource strain: Not on file  . Food insecurity    Worry: Not on file    Inability: Not on file  . Transportation needs    Medical: Not on file    Non-medical: Not on file  Tobacco Use  . Smoking status: Current Every Day Smoker    Packs/day: 1.00    Years: 30.00     Pack years: 30.00  . Smokeless tobacco: Never Used  Substance and Sexual Activity  . Alcohol use: Yes    Alcohol/week: 2.0 standard drinks    Types: 2 Cans of beer per week    Comment: 2 beers a day.  . Drug use: Never  . Sexual activity: Not on file  Lifestyle  . Physical activity    Days per week: Not on file    Minutes per session: Not on file  . Stress: Not on file  Relationships  . Social Herbalist on phone: Not on file    Gets together: Not on file    Attends religious service: Not on file    Active member of club or organization: Not on file    Attends meetings of clubs or organizations: Not on file    Relationship status: Not on file  . Intimate partner violence    Fear of current or ex partner: Not on file    Emotionally abused: Not on file    Physically abused: Not on file    Forced sexual activity: Not on file  Other Topics Concern  . Not on file  Social History Narrative  . Not on file    Past Surgical History:  Procedure Laterality Date  . CARDIAC ELECTROPHYSIOLOGY STUDY AND ABLATION      No family history on file.  Allergies  Allergen Reactions  .  Naproxen Other (See Comments)    (Naprosyn *ANALGESICS - ANTI-INFLAMMATORY*) Nausea, Abdominal pain    Current Outpatient Medications on File Prior to Visit  Medication Sig Dispense Refill  . atorvastatin (LIPITOR) 10 MG tablet Take by mouth.    Marland Kitchen buPROPion (WELLBUTRIN XL) 300 MG 24 hr tablet Take 1 tablet (300 mg total) by mouth daily. 30 tablet 0  . Cholecalciferol (VITAMIN D-1000 MAX ST) 25 MCG (1000 UT) tablet Take by mouth.    . clonazePAM (KLONOPIN) 0.5 MG tablet TK 1 T PO TID PRN    . Difluprednate (DUREZOL) 0.05 % EMUL     . diltiazem (CARDIZEM CD) 180 MG 24 hr capsule Take by mouth.    . DULoxetine (CYMBALTA) 60 MG capsule TK 1 C PO D    . enalapril (VASOTEC) 10 MG tablet Take by mouth.    . enalapril-hydrochlorothiazide (VASERETIC) 10-25 MG tablet Take by mouth.    .  escitalopram (LEXAPRO) 10 MG tablet Take by mouth.    . fexofenadine (ALLEGRA) 180 MG tablet Take by mouth.    . finasteride (PROSCAR) 5 MG tablet 1 tab po q day 90 tablet 1  . gabapentin (NEURONTIN) 100 MG capsule Take by mouth.    Marland Kitchen ketorolac (ACULAR) 0.5 % ophthalmic solution     . Melatonin (MELATONIN MAXIMUM STRENGTH) 5 MG TABS Take by mouth.    . meloxicam (MOBIC) 7.5 MG tablet Take by mouth.    . mirabegron ER (MYRBETRIQ) 25 MG TB24 tablet Take by mouth.    . nitroGLYCERIN (NITROSTAT) 0.4 MG SL tablet Place under the tongue.    Marland Kitchen REXULTI 1 MG TABS TAKE 1 TABLET PO QHS    . rivaroxaban (XARELTO) 20 MG TABS tablet Take by mouth.    . sertraline (ZOLOFT) 100 MG tablet      No current facility-administered medications on file prior to visit.     There were no vitals taken for this visit.      Objective:   Physical Exam  General Mental Status- Alert. General Appearance- Not in acute distress.   Skin General: Color- Normal Color. Moisture- Normal Moisture.  Neck Carotid Arteries- Normal color. Moisture- Normal Moisture. No carotid bruits. No JVD.  Chest and Lung Exam Auscultation: Breath Sounds:-Normal.  Cardiovascular Auscultation:Rythm- Regular. Murmurs & Other Heart Sounds:Auscultation of the heart reveals- No Murmurs.  Abdomen Inspection:-Inspeection Normal. Palpation/Percussion:Note:No mass. Palpation and Percussion of the abdomen reveal- Non Tender, Non Distended + BS, no rebound or guarding.  Neurologic Cranial Nerve exam:- CN III-XII intact(No nystagmus) grossly intact..       Assessment & Plan:  For hx of bph with frequent urination  will have you continue proscar but will get psa, ua poct and urine culture.   May add flomax but will discuss with pharmacist. May need to adjust enalapril if add additional med. (note reviewed literature and did not need to talk with pharmacist. But will wait for lab results and want to discuss potential side effect on bp  with pt and his wife after I review labs)  For hx of htn and atrial flutter continue current meds but may need to decrease elapril if add prostate med due to potential decrease bp and your bp fluctates/low at times. Do not want your systolic to get less than 100 as in that event may have symptoms as discussed.  Will refer to urologist.  Follow up 3 weeks virtual visit or as needed

## 2018-09-06 NOTE — Patient Instructions (Addendum)
For hx of bph with frequent urination  will have you continue proscar but will get psa, ua poct and urine culture.   May add flomax but will discuss with pharmacist. May need to adjust enalapril if add additional med.(note reviewed literature and did not need to talk with pharmacist. But will wait for lab results and want to discuss potential side effect on bp with pt and his wife after I review labs)  For hx of htn and atrial flutter continue current meds but may need to decrease elapril if add prostate med due to potential decrease bp and your bp fluctates/low at times. Do not want your systolic to get less than 100 as in that event may have symptoms as discussed.  Will refer to urologist.  Follow up 3 weeks virtual visit or as needed

## 2018-09-07 ENCOUNTER — Other Ambulatory Visit: Payer: Self-pay | Admitting: Medical

## 2018-09-08 ENCOUNTER — Encounter: Payer: Self-pay | Admitting: Medical

## 2018-09-08 LAB — URINE CULTURE
MICRO NUMBER:: 579329
Result:: NO GROWTH
SPECIMEN QUALITY:: ADEQUATE

## 2018-09-08 NOTE — Addendum Note (Signed)
Addended by: Anabel Halon on: 09/08/2018 03:41 PM   Modules accepted: Orders

## 2018-09-18 ENCOUNTER — Encounter: Payer: Self-pay | Admitting: Medical

## 2018-09-29 ENCOUNTER — Other Ambulatory Visit: Payer: Self-pay | Admitting: Medical

## 2018-10-11 ENCOUNTER — Encounter: Payer: Self-pay | Admitting: Cardiology

## 2018-10-11 ENCOUNTER — Telehealth (INDEPENDENT_AMBULATORY_CARE_PROVIDER_SITE_OTHER): Payer: Medicare Other | Admitting: Cardiology

## 2018-10-11 ENCOUNTER — Other Ambulatory Visit: Payer: Self-pay

## 2018-10-11 VITALS — BP 128/77

## 2018-10-11 DIAGNOSIS — Z9889 Other specified postprocedural states: Secondary | ICD-10-CM

## 2018-10-11 DIAGNOSIS — F17200 Nicotine dependence, unspecified, uncomplicated: Secondary | ICD-10-CM | POA: Diagnosis not present

## 2018-10-11 DIAGNOSIS — I2510 Atherosclerotic heart disease of native coronary artery without angina pectoris: Secondary | ICD-10-CM | POA: Diagnosis not present

## 2018-10-11 DIAGNOSIS — I10 Essential (primary) hypertension: Secondary | ICD-10-CM

## 2018-10-11 DIAGNOSIS — E785 Hyperlipidemia, unspecified: Secondary | ICD-10-CM

## 2018-10-11 DIAGNOSIS — Z8679 Personal history of other diseases of the circulatory system: Secondary | ICD-10-CM

## 2018-10-11 MED ORDER — NITROGLYCERIN 0.4 MG SL SUBL: 0.4000 mg | SUBLINGUAL_TABLET | SUBLINGUAL | 3 refills | Status: DC | PRN

## 2018-10-11 MED ORDER — RIVAROXABAN 20 MG PO TABS: 20.0000 mg | ORAL_TABLET | Freq: Every day | ORAL | 3 refills | Status: DC

## 2018-10-11 MED ORDER — DILTIAZEM HCL ER COATED BEADS 120 MG PO CP24: 120.0000 mg | ORAL_CAPSULE | Freq: Every day | ORAL | 1 refills | Status: DC

## 2018-10-11 NOTE — Progress Notes (Signed)
Virtual Visit via Video Note   This visit type was conducted due to national recommendations for restrictions regarding the COVID-19 Pandemic (e.g. social distancing) in an effort to limit this patient's exposure and mitigate transmission in our community.  Due to his co-morbid illnesses, this patient is at least at moderate risk for complications without adequate follow up.  This format is felt to be most appropriate for this patient at this time.  All issues noted in this document were discussed and addressed.  A limited physical exam was performed with this format.  Please refer to the patient's chart for his consent to telehealth for [facility].  Evaluation Performed:  Follow-up visit  This visit type was conducted due to national recommendations for restrictions regarding the COVID-19 Pandemic (e.g. social distancing).  This format is felt to be most appropriate for this patient at this time.  All issues noted in this document were discussed and addressed.  No physical exam was performed (except for noted visual exam findings with Video Visits).  Please refer to the patient's chart (MyChart message for video visits and phone note for telephone visits) for the patient's consent to telehealth for [facility].  Date:  10/11/2018  ID: [name], DOB 1951-02-02, MRN [id number]   Patient Location: [place] [address]   Provider location:   [place] Office  PCP:  Mackie Pai, [place]  Cardiologist:  Jenne Campus, MD     Chief Complaint: I am weak and tired  History of Present Illness:    Robert Lang is a 68 y.o. male  who presents via audio/video conferencing for a telehealth visit today.  With coronary artery disease, status post atrial flutter ablation, smoking, hypertension.  He does have rectal tele-visit with me today.  He complained of being weak tired and exhausted.  Also described to have some dizziness he tells me that  sometimes in the morning when he gets up he feels dizzy he check his blood pressure his systolic blood pressure will be less than 100.  Denies having any palpitations, no chest pain.   The patient does not have symptoms concerning for COVID-19 infection (fever, chills, cough, or new SHORTNESS OF BREATH).    Prior CV studies:   The following studies were reviewed today:       Past Medical History:  Diagnosis Date  . Allergy   . Arthritis   . Depression   . Hyperlipidemia   . Hypertension     Past Surgical History:  Procedure Laterality Date  . CARDIAC ELECTROPHYSIOLOGY STUDY AND ABLATION       Current Meds  Medication Sig  . atorvastatin (LIPITOR) 10 MG tablet Take by mouth.  Marland Kitchen buPROPion (WELLBUTRIN XL) 300 MG 24 hr tablet TAKE 1 TABLET(300 MG) BY MOUTH DAILY  . Cholecalciferol (VITAMIN D-1000 MAX ST) 25 MCG (1000 UT) tablet Take by mouth.  . diltiazem (CARDIZEM CD) 180 MG 24 hr capsule Take by mouth.  . DULoxetine (CYMBALTA) 60 MG capsule TK 1 C PO D  . enalapril (VASOTEC) 5 MG tablet TAKE 1 TABLET(5 MG) BY MOUTH DAILY  . fexofenadine (ALLEGRA) 180 MG tablet Take by mouth.  . finasteride (PROSCAR) 5 MG tablet 1 tab po q day  . gabapentin (NEURONTIN) 100 MG capsule Take 200 mg by mouth.   . nitroGLYCERIN (NITROSTAT) 0.4 MG SL tablet Place under the tongue.  . rivaroxaban (XARELTO) 20 MG TABS tablet Take by mouth.  . sertraline (ZOLOFT)  100 MG tablet   . tamsulosin (FLOMAX) 0.4 MG CAPS capsule TAKE 1 CAPSULE(0.4 MG) BY MOUTH DAILY      Family History: The patient's family history is not on file.   ROS:   Please see the history of present illness.     All other systems reviewed and are negative.   Labs/Other Tests and Data Reviewed:     Recent Labs: 09/06/2018: ALT 29; BUN 17; Creatinine, Ser 0.87; Potassium 4.6; Sodium 134  Recent Lipid Panel    Component Value Date/Time   CHOL 124 09/06/2018 0924   TRIG 203.0 (H) 09/06/2018 0924   HDL 40.10 09/06/2018  0924   CHOLHDL 3 09/06/2018 0924   VLDL 40.6 (H) 09/06/2018 0924   LDLDIRECT 66.0 09/06/2018 0924      Exam:    Vital Signs:  BP 128/77     Wt Readings from Last 3 Encounters:  08/23/18 185 lb (83.9 kg)  07/06/18 190 lb (86.2 kg)     Well nourished, well developed in no acute distress. Alert awake and attentive 3 talking to me over the video link.  Not in any distress.  Diagnosis for this visit:   1. Status post ablation of atrial flutter   2. Coronary artery disease involving native coronary artery of native heart without angina pectoris   3. Essential hypertension   4. Dyslipidemia   5. Smoking      ASSESSMENT & PLAN:    1.  Status post atrial flutter ablation.  Doing well from that point review asymptomatic continue present management which include anticoagulation. 2.  Coronary artery disease denies having any chest pain. 3.  Essential hypertension blood pressure well controlled continue present management. 4.  Dyslipidemia.  I will bring him to the office next week will check his fasting lipid profile. Smoking I spent at least 5 minutes talking to him but need to quit he understand he will try to do it.  COVID-19 Education: The signs and symptoms of COVID-19 were discussed with the patient and how to seek care for testing (follow up with PCP or arrange E-visit).  The importance of social distancing was discussed today.  Patient Risk:   After full review of this patients clinical status, I feel that they are at least moderate risk at this time.  Time:   Today, I have spent 15 minutes with the patient with telehealth technology discussing pt health issues.  I spent 5 minutes reviewing her chart before the visit.  Visit was finished at 10:44 AM.    Medication Adjustments/Labs and Tests Ordered: Current medicines are reviewed at length with the patient today.  Concerns regarding medicines are outlined above.  No orders of the defined types were placed in this  encounter.  Medication changes: No orders of the defined types were placed in this encounter.    Disposition: Follow-up next week in the office in [place], Park Liter, MD, [facility] 10/11/2018 10:45 AM    [place]

## 2018-10-11 NOTE — Addendum Note (Signed)
Addended by: Ashok Norris on: 10/11/2018 12:03 PM   Modules accepted: Orders

## 2018-10-11 NOTE — Patient Instructions (Signed)
Medication Instructions:  Your physician has recommended you make the following change in your medication:   DECREASE:Cardizem to 120 mg daily  If you need a refill on your cardiac medications before your next appointment, please call your pharmacy.   Lab work: None.  If you have labs (blood work) drawn today and your tests are completely normal, you will receive your results only by: Marland Kitchen MyChart Message (if you have MyChart) OR . A paper copy in the mail If you have any lab test that is abnormal or we need to change your treatment, we will call you to review the results.  Testing/Procedures: None.   Follow-Up: At [facility], you and your health needs are our priority.  As part of our continuing mission to provide you with exceptional heart care, we have created designated Provider Care Teams.  These Care Teams include your primary Cardiologist (physician) and Advanced Practice Providers (APPs -  Physician Assistants and Nurse Practitioners) who all work together to provide you with the care you need, when you need it. You will need a follow up appointment in 1 weeks.  Please call our office 2 months in advance to schedule this appointment.  You may see No primary care provider on file. or another member of our [employer] Provider Team in [place]: Shirlee More, MD . Jyl Heinz, MD  Any Other Special Instructions Will Be Listed Below (If Applicable).

## 2018-10-12 MED ORDER — RIVAROXABAN 20 MG PO TABS: 20.0000 mg | ORAL_TABLET | Freq: Every day | ORAL | 3 refills | Status: DC

## 2018-10-12 NOTE — Addendum Note (Signed)
Addended by: Ashok Norris on: 10/12/2018 12:37 PM   Modules accepted: Orders

## 2018-10-20 ENCOUNTER — Other Ambulatory Visit: Payer: Self-pay

## 2018-10-20 ENCOUNTER — Ambulatory Visit (INDEPENDENT_AMBULATORY_CARE_PROVIDER_SITE_OTHER): Payer: Medicare Other | Admitting: Cardiology

## 2018-10-20 ENCOUNTER — Ambulatory Visit (HOSPITAL_BASED_OUTPATIENT_CLINIC_OR_DEPARTMENT_OTHER)
Admission: RE | Admit: 2018-10-20 | Discharge: 2018-10-20 | Disposition: A | Discharge status: Home / Self Care | Payer: Medicare Other | Source: Ambulatory Visit | Attending: Internal Medicine | Admitting: Internal Medicine

## 2018-10-20 VITALS — BP 100/50 | HR 109 | Ht 71.0 in | Wt 182.0 lb

## 2018-10-20 DIAGNOSIS — I10 Essential (primary) hypertension: Secondary | ICD-10-CM | POA: Diagnosis present

## 2018-10-20 DIAGNOSIS — Z9889 Other specified postprocedural states: Secondary | ICD-10-CM

## 2018-10-20 DIAGNOSIS — R0609 Other forms of dyspnea: Secondary | ICD-10-CM

## 2018-10-20 DIAGNOSIS — I2510 Atherosclerotic heart disease of native coronary artery without angina pectoris: Secondary | ICD-10-CM

## 2018-10-20 DIAGNOSIS — Z8679 Personal history of other diseases of the circulatory system: Secondary | ICD-10-CM

## 2018-10-20 DIAGNOSIS — E785 Hyperlipidemia, unspecified: Secondary | ICD-10-CM | POA: Diagnosis present

## 2018-10-20 DIAGNOSIS — F17200 Nicotine dependence, unspecified, uncomplicated: Secondary | ICD-10-CM

## 2018-10-20 DIAGNOSIS — R0600 Dyspnea, unspecified: Secondary | ICD-10-CM

## 2018-10-20 HISTORY — DX: Other forms of dyspnea: R06.09

## 2018-10-20 HISTORY — DX: Dyspnea, unspecified: R06.00

## 2018-10-20 NOTE — Progress Notes (Signed)
Cardiology Office Note:    Date:  10/20/2018   ID:  Robert Lang, DOB 05/22/1950, MRN [id number]  PCP:  Mackie Pai, [name]  Cardiologist:  Jenne Campus, MD    Referring MD: Elise Benne   Chief Complaint  Patient presents with  . One week follow follow up  Not doing well  History of Present Illness:    [name] is a 68 y.o. male with history of atrial flutter ablation, smoking, hypertension, dyslipidemia comes today to my office he is not doing well he said he can cannot tolerate heat anytime he tried to go outside he would get very short of breath.  Described also to have some chest pain chest pain can happen when she sit watch TV but also can happen when he walks he will feel uneasy sensation in the chest that sensation usually last for few minutes.  He is very disappointed way he feels because he would like to be more active but he cannot because of the sensation.  Denies having any palpitations but even when he got his atrial flutter he was asymptomatic.  He does have history of coronary artery disease with stent placed in 2017.  I am not sure exactly which artery.  Past Medical History:  Diagnosis Date  . Allergy   . Arthritis   . Depression   . Hyperlipidemia   . Hypertension     Past Surgical History:  Procedure Laterality Date  . CARDIAC ELECTROPHYSIOLOGY STUDY AND ABLATION      Current Medications: Current Meds  Medication Sig  . ASPIRIN 81 PO Take 81 mg by mouth.  Marland Kitchen atorvastatin (LIPITOR) 10 MG tablet Take by mouth.  Marland Kitchen buPROPion (WELLBUTRIN XL) 300 MG 24 hr tablet TAKE 1 TABLET(300 MG) BY MOUTH DAILY  . Cholecalciferol (VITAMIN D-1000 MAX ST) 25 MCG (1000 UT) tablet Take by mouth.  . diltiazem (CARDIZEM CD) 120 MG 24 hr capsule Take 1 capsule (120 mg total) by mouth daily.  . enalapril (VASOTEC) 5 MG tablet TAKE 1 TABLET(5 MG) BY MOUTH DAILY  . fexofenadine (ALLEGRA) 180 MG tablet Take by mouth.  . finasteride (PROSCAR) 5 MG tablet 1  tab po q day  . gabapentin (NEURONTIN) 100 MG capsule Take 200 mg by mouth.   . loratadine-pseudoephedrine (CLARITIN-D 24-HOUR) 10-240 MG 24 hr tablet Take 1 tablet by mouth as needed for allergies.  . nitroGLYCERIN (NITROSTAT) 0.4 MG SL tablet Place 1 tablet (0.4 mg total) under the tongue every 5 (five) minutes as needed for chest pain.  . Omega-3 Fatty Acids (FISH OIL) 1000 MG CAPS Take 1,000 mg by mouth.  . rivaroxaban (XARELTO) 20 MG TABS tablet Take 1 tablet (20 mg total) by mouth daily with supper.  . sertraline (ZOLOFT) 100 MG tablet   . tamsulosin (FLOMAX) 0.4 MG CAPS capsule TAKE 1 CAPSULE(0.4 MG) BY MOUTH DAILY  . Turmeric 500 MG CAPS Take 1 capsule by mouth daily.  . vitamin B-12 (CYANOCOBALAMIN) 1000 MCG tablet Take 1,000 mcg by mouth daily.     Allergies:   Naproxen   Social History   Socioeconomic History  . Marital status: Married    Spouse name: Not on file  . Number of children: Not on file  . Years of education: Not on file  . Highest education level: Not on file  Occupational History  . Not on file  Social Needs  . Financial resource strain: Not on file  . Food insecurity    Worry: Not on file  Inability: Not on file  . Transportation needs    Medical: Not on file    Non-medical: Not on file  Tobacco Use  . Smoking status: Current Every Day Smoker    Packs/day: 1.00    Years: 30.00    Pack years: 30.00  . Smokeless tobacco: Never Used  Substance and Sexual Activity  . Alcohol use: Yes    Alcohol/week: 2.0 standard drinks    Types: 2 Cans of beer per week    Comment: 2 beers a day.  . Drug use: Never  . Sexual activity: Not on file  Lifestyle  . Physical activity    Days per week: Not on file    Minutes per session: Not on file  . Stress: Not on file  Relationships  . Social Herbalist on phone: Not on file    Gets together: Not on file    Attends religious service: Not on file    Active member of club or organization: Not on file     Attends meetings of clubs or organizations: Not on file    Relationship status: Not on file  Other Topics Concern  . Not on file  Social History Narrative  . Not on file     Family History: The patient's family history is not on file. ROS:   Please see the history of present illness.    All 14 point review of systems negative except as described per history of present illness  EKGs/Labs/Other Studies Reviewed:      Recent Labs: 09/06/2018: ALT 29; BUN 17; Creatinine, Ser 0.87; Potassium 4.6; Sodium 134  Recent Lipid Panel    Component Value Date/Time   CHOL 124 09/06/2018 0924   TRIG 203.0 (H) 09/06/2018 0924   HDL 40.10 09/06/2018 0924   CHOLHDL 3 09/06/2018 0924   VLDL 40.6 (H) 09/06/2018 0924   LDLDIRECT 66.0 09/06/2018 0924    Physical Exam:    VS:  BP (!) 100/50   Pulse (!) 109   Ht 5\' 11"  (1.803 m)   Wt 182 lb (82.6 kg)   SpO2 95%   BMI 25.38 kg/m     Wt Readings from Last 3 Encounters:  10/20/18 182 lb (82.6 kg)  08/23/18 185 lb (83.9 kg)  07/06/18 190 lb (86.2 kg)     GEN:  Well nourished, well developed in no acute distress HEENT: Normal NECK: No JVD; No carotid bruits LYMPHATICS: No lymphadenopathy CARDIAC: RRR, no murmurs, no rubs, no gallops RESPIRATORY:  Clear to auscultation without rales, wheezing or rhonchi  ABDOMEN: Soft, non-tender, non-distended MUSCULOSKELETAL:  No edema; No deformity  SKIN: Warm and dry LOWER EXTREMITIES: no swelling NEUROLOGIC:  Alert and oriented x 3 PSYCHIATRIC:  Normal affect   ASSESSMENT:    1. Essential hypertension   2. Dyspnea on exertion   3. Coronary artery disease involving native coronary artery of native heart without angina pectoris   4. Status post ablation of atrial flutter   5. Smoking    PLAN:    In order of problems listed above:  1. Essential hypertension blood pressure appears to be well controlled continue present management. 2. 2.  Dyspnea on exertion undoubtedly multifactorial.   Obviously smoking is a problem.  I will ask him to have echocardiogram done he was scheduled but for some reason did not happened the key will be to assess his left ventricular ejection fraction. 3. Chest pain with history of coronary artery disease he will be scheduled  to have a stress test. 4. Status post atrial flutter ablation.  Will put monitor on to see if he gets still some recurrences of arrhythmia.  We will continue anticoagulation. 5. Smoking.  We spent a great deal of time talking about need to quit he understand he will try to work on it.   Medication Adjustments/Labs and Tests Ordered: Current medicines are reviewed at length with the patient today.  Concerns regarding medicines are outlined above.  No orders of the defined types were placed in this encounter.  Medication changes: No orders of the defined types were placed in this encounter.   Signed, Park Liter, MD, [facility] 10/20/2018 9:09 AM    [place]

## 2018-10-20 NOTE — Progress Notes (Signed)
  Echocardiogram 2D Echocardiogram has been performed.  Robert Lang M 10/20/2018, 1:29 PM

## 2018-10-20 NOTE — Patient Instructions (Signed)
Medication Instructions:  Your physician recommends that you continue on your current medications as directed. Please refer to the Current Medication list given to you today.  If you need a refill on your cardiac medications before your next appointment, please call your pharmacy.   Lab work: None.  If you have labs (blood work) drawn today and your tests are completely normal, you will receive your results only by: Marland Kitchen MyChart Message (if you have MyChart) OR . A paper copy in the mail If you have any lab test that is abnormal or we need to change your treatment, we will call you to review the results.  Testing/Procedures: Your physician has requested that you have an echocardiogram. Echocardiography is a painless test that uses sound waves to create images of your heart. It provides your doctor with information about the size and shape of your heart and how well your heart's chambers and valves are working. This procedure takes approximately one hour. There are no restrictions for this procedure.  Your physician has recommended that you wear a holter monitor. Holter monitors are medical devices that record the heart's electrical activity. Doctors most often use these monitors to diagnose arrhythmias. Arrhythmias are problems with the speed or rhythm of the heartbeat. The monitor is a small, portable device. You can wear one while you do your normal daily activities. This is usually used to diagnose what is causing palpitations/syncope (passing out).Wear for 7 days.   Your physician has requested that you have a lexiscan myoview. For further information please visit [email or web address]. Please follow instruction sheet, as given.      Follow-Up: At [facility], you and your health needs are our priority.  As part of our continuing mission to provide you with exceptional heart care, we have created designated Provider Care Teams.  These Care Teams include your primary Cardiologist  (physician) and Advanced Practice Providers (APPs -  Physician Assistants and Nurse Practitioners) who all work together to provide you with the care you need, when you need it. You will need a follow up appointment in 6 weeks.  Please call our office 2 months in advance to schedule this appointment.  You may see No primary care provider on file. or another member of our [employer] Provider Team in [place]: Shirlee More, MD . Jyl Heinz, MD  Any Other Special Instructions Will Be Listed Below (If Applicable).   Echocardiogram An echocardiogram is a procedure that uses painless sound waves (ultrasound) to produce an image of the heart. Images from an echocardiogram can provide important information about:  Signs of coronary artery disease (CAD).  Aneurysm detection. An aneurysm is a weak or damaged part of an artery wall that bulges out from the normal force of blood pumping through the body.  Heart size and shape. Changes in the size or shape of the heart can be associated with certain conditions, including heart failure, aneurysm, and CAD.  Heart muscle function.  Heart valve function.  Signs of a past heart attack.  Fluid buildup around the heart.  Thickening of the heart muscle.  A tumor or infectious growth around the heart valves. Tell a health care provider about:  Any allergies you have.  All medicines you are taking, including vitamins, herbs, eye drops, creams, and over-the-counter medicines.  Any blood disorders you have.  Any surgeries you have had.  Any medical conditions you have.  Whether you are pregnant or may be pregnant. What are the risks? Generally, this is a  safe procedure. However, problems may occur, including:  Allergic reaction to dye (contrast) that may be used during the procedure. What happens before the procedure? No specific preparation is needed. You may eat and drink normally. What happens during the procedure?   An IV tube  may be inserted into one of your veins.  You may receive contrast through this tube. A contrast is an injection that improves the quality of the pictures from your heart.  A gel will be applied to your chest.  A wand-like tool (transducer) will be moved over your chest. The gel will help to transmit the sound waves from the transducer.  The sound waves will harmlessly bounce off of your heart to allow the heart images to be captured in real-time motion. The images will be recorded on a computer. The procedure may vary among health care providers and hospitals. What happens after the procedure?  You may return to your normal, everyday life, including diet, activities, and medicines, unless your health care provider tells you not to do that. Summary  An echocardiogram is a procedure that uses painless sound waves (ultrasound) to produce an image of the heart.  Images from an echocardiogram can provide important information about the size and shape of your heart, heart muscle function, heart valve function, and fluid buildup around your heart.  You do not need to do anything to prepare before this procedure. You may eat and drink normally.  After the echocardiogram is completed, you may return to your normal, everyday life, unless your health care provider tells you not to do that. This information is not intended to replace advice given to you by your health care provider. Make sure you discuss any questions you have with your health care provider. Document Released: 03/05/2000 Document Revised: 06/29/2018 Document Reviewed: 04/10/2016 Elsevier Patient Education  2020 [employer].   Cardiac Nuclear Scan A cardiac nuclear scan is a test that measures blood flow to the heart when a person is resting and when he or she is exercising. The test looks for problems such as:  Not enough blood reaching a portion of the heart.  The heart muscle not working normally. You may need this test if:   You have heart disease.  You have had abnormal lab results.  You have had heart surgery or a balloon procedure to open up blocked arteries (angioplasty).  You have chest pain.  You have shortness of breath. In this test, a radioactive dye (tracer) is injected into your bloodstream. After the tracer has traveled to your heart, an imaging device is used to measure how much of the tracer is absorbed by or distributed to various areas of your heart. This procedure is usually done at a hospital and takes 2-4 hours. Tell a health care provider about:  Any allergies you have.  All medicines you are taking, including vitamins, herbs, eye drops, creams, and over-the-counter medicines.  Any problems you or family members have had with anesthetic medicines.  Any blood disorders you have.  Any surgeries you have had.  Any medical conditions you have.  Whether you are pregnant or may be pregnant. What are the risks? Generally, this is a safe procedure. However, problems may occur, including:  Serious chest pain and heart attack. This is only a risk if the stress portion of the test is done.  Rapid heartbeat.  Sensation of warmth in your chest. This usually passes quickly.  Allergic reaction to the tracer. What happens before the procedure?  Ask your health care provider about changing or stopping your regular medicines. This is especially important if you are taking diabetes medicines or blood thinners.  Follow instructions from your health care provider about eating or drinking restrictions.  Remove your jewelry on the day of the procedure. What happens during the procedure?  An IV will be inserted into one of your veins.  Your health care provider will inject a small amount of radioactive tracer through the IV.  You will wait for 20-40 minutes while the tracer travels through your bloodstream.  Your heart activity will be monitored with an electrocardiogram (ECG).  You  will lie down on an exam table.  Images of your heart will be taken for about 15-20 minutes.  You may also have a stress test. For this test, one of the following may be done: ? You will exercise on a treadmill or stationary bike. While you exercise, your heart's activity will be monitored with an ECG, and your blood pressure will be checked. ? You will be given medicines that will increase blood flow to parts of your heart. This is done if you are unable to exercise.  When blood flow to your heart has peaked, a tracer will again be injected through the IV.  After 20-40 minutes, you will get back on the exam table and have more images taken of your heart.  Depending on the type of tracer used, scans may need to be repeated 3-4 hours later.  Your IV line will be removed when the procedure is over. The procedure may vary among health care providers and hospitals. What happens after the procedure?  Unless your health care provider tells you otherwise, you may return to your normal schedule, including diet, activities, and medicines.  Unless your health care provider tells you otherwise, you may increase your fluid intake. This will help to flush the contrast dye from your body. Drink enough fluid to keep your urine pale yellow.  Ask your health care provider, or the department that is doing the test: ? When will my results be ready? ? How will I get my results? Summary  A cardiac nuclear scan measures the blood flow to the heart when a person is resting and when he or she is exercising.  Tell your health care provider if you are pregnant.  Before the procedure, ask your health care provider about changing or stopping your regular medicines. This is especially important if you are taking diabetes medicines or blood thinners.  After the procedure, unless your health care provider tells you otherwise, increase your fluid intake. This will help flush the contrast dye from your body.   After the procedure, unless your health care provider tells you otherwise, you may return to your normal schedule, including diet, activities, and medicines. This information is not intended to replace advice given to you by your health care provider. Make sure you discuss any questions you have with your health care provider. Document Released: 04/02/2004 Document Revised: 08/22/2017 Document Reviewed: 08/22/2017 Elsevier Patient Education  2020 [employer].

## 2018-10-22 LAB — ECHOCARDIOGRAM COMPLETE
Height: 71 in
Weight: 2912 oz

## 2018-10-23 ENCOUNTER — Encounter: Payer: Self-pay | Admitting: Cardiology

## 2018-10-26 ENCOUNTER — Telehealth (HOSPITAL_COMMUNITY): Payer: Self-pay | Admitting: *Deleted

## 2018-10-26 NOTE — Telephone Encounter (Signed)
Patient given detailed instructions per Myocardial Perfusion Study Information Sheet for the test on 10/30/18. Patient notified to arrive 15 minutes early and that it is imperative to arrive on time for appointment to keep from having the test rescheduled.  If you need to cancel or reschedule your appointment, please call the office within 24 hours of your appointment. . Patient verbalized understanding. Kirstie Peri

## 2018-10-27 ENCOUNTER — Other Ambulatory Visit (INDEPENDENT_AMBULATORY_CARE_PROVIDER_SITE_OTHER): Payer: Medicare Other

## 2018-10-27 DIAGNOSIS — I10 Essential (primary) hypertension: Secondary | ICD-10-CM

## 2018-10-27 DIAGNOSIS — I2510 Atherosclerotic heart disease of native coronary artery without angina pectoris: Secondary | ICD-10-CM

## 2018-10-27 DIAGNOSIS — R0609 Other forms of dyspnea: Secondary | ICD-10-CM

## 2018-10-30 ENCOUNTER — Other Ambulatory Visit: Payer: Self-pay

## 2018-10-30 ENCOUNTER — Ambulatory Visit (HOSPITAL_COMMUNITY): Payer: Medicare Other | Attending: Cardiovascular Disease

## 2018-10-30 VITALS — Ht 71.0 in | Wt 182.0 lb

## 2018-10-30 DIAGNOSIS — I10 Essential (primary) hypertension: Secondary | ICD-10-CM | POA: Diagnosis not present

## 2018-10-30 DIAGNOSIS — I4892 Unspecified atrial flutter: Secondary | ICD-10-CM | POA: Insufficient documentation

## 2018-10-30 DIAGNOSIS — I2510 Atherosclerotic heart disease of native coronary artery without angina pectoris: Secondary | ICD-10-CM | POA: Diagnosis not present

## 2018-10-30 DIAGNOSIS — R0609 Other forms of dyspnea: Secondary | ICD-10-CM

## 2018-10-30 LAB — MYOCARDIAL PERFUSION IMAGING
LV dias vol: 62 mL (ref 62–150)
LV sys vol: 22 mL
Peak HR: 105 {beats}/min
Rest HR: 96 {beats}/min
SDS: 0
SRS: 0
SSS: 0
TID: 1.1

## 2018-10-30 MED ORDER — TECHNETIUM TC 99M TETROFOSMIN IV KIT
32.5000 | PACK | Freq: Once | INTRAVENOUS | Status: AC | PRN
  Administered 2018-10-30: 32.5 via INTRAVENOUS
  Filled 2018-10-30: qty 33

## 2018-10-30 MED ORDER — TECHNETIUM TC 99M TETROFOSMIN IV KIT
10.1000 | PACK | Freq: Once | INTRAVENOUS | Status: AC | PRN
  Administered 2018-10-30: 08:00:00 10.1 via INTRAVENOUS
  Filled 2018-10-30: qty 11

## 2018-10-30 MED ORDER — REGADENOSON 0.4 MG/5ML IV SOLN
0.4000 mg | Freq: Once | INTRAVENOUS | Status: AC
  Administered 2018-10-30: 0.4 mg via INTRAVENOUS

## 2018-11-01 ENCOUNTER — Telehealth: Payer: Self-pay | Admitting: Emergency Medicine

## 2018-11-01 NOTE — Telephone Encounter (Signed)
Left message for patient to return call for results.

## 2018-11-01 NOTE — Telephone Encounter (Signed)
Patient informed of results.  

## 2018-11-01 NOTE — Telephone Encounter (Signed)
Patient returned your call for results °

## 2018-11-08 ENCOUNTER — Other Ambulatory Visit: Payer: Self-pay | Admitting: Medical

## 2018-11-22 ENCOUNTER — Other Ambulatory Visit: Payer: Self-pay

## 2018-11-23 ENCOUNTER — Ambulatory Visit (INDEPENDENT_AMBULATORY_CARE_PROVIDER_SITE_OTHER): Payer: Medicare Other | Admitting: Medical

## 2018-11-23 ENCOUNTER — Encounter: Payer: Self-pay | Admitting: Medical

## 2018-11-23 ENCOUNTER — Ambulatory Visit (HOSPITAL_BASED_OUTPATIENT_CLINIC_OR_DEPARTMENT_OTHER)
Admission: RE | Admit: 2018-11-23 | Discharge: 2018-11-23 | Disposition: A | Discharge status: Home / Self Care | Payer: Medicare Other | Source: Ambulatory Visit | Attending: Medical | Admitting: Medical

## 2018-11-23 VITALS — BP 118/70 | HR 96 | Temp 97.2°F | Resp 16 | Ht 71.0 in | Wt 181.8 lb

## 2018-11-23 DIAGNOSIS — E785 Hyperlipidemia, unspecified: Secondary | ICD-10-CM

## 2018-11-23 DIAGNOSIS — R05 Cough: Secondary | ICD-10-CM | POA: Diagnosis not present

## 2018-11-23 DIAGNOSIS — I2510 Atherosclerotic heart disease of native coronary artery without angina pectoris: Secondary | ICD-10-CM | POA: Diagnosis not present

## 2018-11-23 DIAGNOSIS — Z23 Encounter for immunization: Secondary | ICD-10-CM

## 2018-11-23 DIAGNOSIS — Z87438 Personal history of other diseases of male genital organs: Secondary | ICD-10-CM | POA: Diagnosis not present

## 2018-11-23 DIAGNOSIS — R059 Cough, unspecified: Secondary | ICD-10-CM

## 2018-11-23 DIAGNOSIS — Z87891 Personal history of nicotine dependence: Secondary | ICD-10-CM

## 2018-11-23 DIAGNOSIS — I10 Essential (primary) hypertension: Secondary | ICD-10-CM

## 2018-11-23 MED ORDER — ATORVASTATIN CALCIUM 10 MG PO TABS: 10.0000 mg | ORAL_TABLET | Freq: Every day | ORAL | 0 refills | Status: DC

## 2018-11-23 MED ORDER — ENALAPRIL MALEATE 5 MG PO TABS: ORAL_TABLET | ORAL | 0 refills | Status: DC

## 2018-11-23 MED ORDER — RIVAROXABAN 20 MG PO TABS: 20.0000 mg | ORAL_TABLET | Freq: Every day | ORAL | 0 refills | Status: DC

## 2018-11-23 MED ORDER — TAMSULOSIN HCL 0.4 MG PO CAPS: ORAL_CAPSULE | ORAL | 0 refills | Status: DC

## 2018-11-23 NOTE — Patient Instructions (Addendum)
Your blood pressure is well controlled today and recommend that you continue current BP medication regimen.  You do have high cholesterol and your lipids done recently showed some elevated triglycerides.  With your history of smoking or other risk factors recommend low-cholesterol diet and continue atorvastatin.  You do have history of BPH and have been evaluated by urologist.  They gave you medication which you thought caused side effects of reflux.  When he stopped medications reflux type symptoms resolved.  On follow-up with urologist this coming month recommend discuss with specialist.  They might give alternative medication.  If you do have intermittent reflux symptoms you want to use Tums and declined famotidine presently.  But if you feel like you need additional medication just let me know and I will send in the prescription.  For history of smoking, I do want you to get chest x-ray today.  Your intermittent cough is likely related to the smoking.  I offered you inhaler today since you report history of COPD in the past but you declined.  If you do want me to send inhaler just let me know and I will do that.  Pneumonia vaccine given today.  Follow-up in 3 to 4 months or as needed.  Note next appointment could be virtual if you choose but would want you to have a blood pressure and a pulse reading on day of virtual visit.

## 2018-11-23 NOTE — Progress Notes (Signed)
Subjective:    Patient ID: Robert Lang, male    DOB: 1950/06/29, 68 y.o.   MRN: [id number]  HPI  Pt in for follow up.  Pt has hx of htn and high cholesterol. No cardiac or neurologic signs or symptoms.  Pt has hx of some bph symptoms. Seen by urologist. Pt given meds for that. He states med caused heart burn. He stopped bph med and reflux stopped. If gets reflux in past states responds to tums as well.  Epic review indicates needs pcv-13.  Pt is still smoking. Hx of copd. Some intermittent cough. No fever, chills, sweats or body aches. Describes chronic smoker cough that is mild.    Review of Systems  Constitutional: Negative for chills, fatigue and fever.       But notes walking outdoors in heat of day causes him fatigue.  Respiratory: Negative for cough, chest tightness and shortness of breath.   Cardiovascular: Negative for chest pain and palpitations.  Gastrointestinal: Negative for abdominal pain, constipation, diarrhea, nausea and vomiting.       Gerd brief one week ago. Had burn sensation and sour taste mouth. Resolved one week ago.  Pt states tums works.  Genitourinary: Negative for dysuria and frequency.  Musculoskeletal: Negative for back pain and neck pain.  Skin: Negative for rash.  Neurological: Negative for dizziness, syncope, weakness, numbness and headaches.  Hematological: Negative for adenopathy. Does not bruise/bleed easily.  Psychiatric/Behavioral: Negative for behavioral problems and confusion.    Past Medical History:  Diagnosis Date  . Allergy   . Arthritis   . Depression   . Hyperlipidemia   . Hypertension      Social History   Socioeconomic History  . Marital status: Married    Spouse name: Not on file  . Number of children: Not on file  . Years of education: Not on file  . Highest education level: Not on file  Occupational History  . Not on file  Social Needs  . Financial resource strain: Not on file  . Food insecurity   Worry: Not on file    Inability: Not on file  . Transportation needs    Medical: Not on file    Non-medical: Not on file  Tobacco Use  . Smoking status: Current Every Day Smoker    Packs/day: 1.00    Years: 30.00    Pack years: 30.00  . Smokeless tobacco: Never Used  Substance and Sexual Activity  . Alcohol use: Yes    Alcohol/week: 2.0 standard drinks    Types: 2 Cans of beer per week    Comment: 2 beers a day.  . Drug use: Never  . Sexual activity: Not on file  Lifestyle  . Physical activity    Days per week: Not on file    Minutes per session: Not on file  . Stress: Not on file  Relationships  . Social Herbalist on phone: Not on file    Gets together: Not on file    Attends religious service: Not on file    Active member of club or organization: Not on file    Attends meetings of clubs or organizations: Not on file    Relationship status: Not on file  . Intimate partner violence    Fear of current or ex partner: Not on file    Emotionally abused: Not on file    Physically abused: Not on file    Forced sexual activity: Not on file  Other Topics Concern  . Not on file  Social History Narrative  . Not on file    Past Surgical History:  Procedure Laterality Date  . CARDIAC ELECTROPHYSIOLOGY STUDY AND ABLATION      No family history on file.  Allergies  Allergen Reactions  . Naproxen Other (See Comments)    (Naprosyn *ANALGESICS - ANTI-INFLAMMATORY*) Nausea, Abdominal pain    Current Outpatient Medications on File Prior to Visit  Medication Sig Dispense Refill  . ASPIRIN 81 PO Take 81 mg by mouth.    Marland Kitchen buPROPion (WELLBUTRIN XL) 300 MG 24 hr tablet TAKE 1 TABLET(300 MG) BY MOUTH DAILY 30 tablet 0  . Cholecalciferol (VITAMIN D-1000 MAX ST) 25 MCG (1000 UT) tablet Take by mouth.    . diltiazem (CARDIZEM CD) 120 MG 24 hr capsule Take 1 capsule (120 mg total) by mouth daily. 90 capsule 1  . fexofenadine (ALLEGRA) 180 MG tablet Take by mouth.    .  finasteride (PROSCAR) 5 MG tablet 1 tab po q day 90 tablet 1  . gabapentin (NEURONTIN) 100 MG capsule Take 200 mg by mouth.     . loratadine-pseudoephedrine (CLARITIN-D 24-HOUR) 10-240 MG 24 hr tablet Take 1 tablet by mouth as needed for allergies.    . nitroGLYCERIN (NITROSTAT) 0.4 MG SL tablet Place 1 tablet (0.4 mg total) under the tongue every 5 (five) minutes as needed for chest pain. 25 tablet 3  . Omega-3 Fatty Acids (FISH OIL) 1000 MG CAPS Take 1,000 mg by mouth.    . sertraline (ZOLOFT) 100 MG tablet     . Turmeric 500 MG CAPS Take 1 capsule by mouth daily.    . vitamin B-12 (CYANOCOBALAMIN) 1000 MCG tablet Take 1,000 mcg by mouth daily.     No current facility-administered medications on file prior to visit.     BP (!) 123/111   Pulse 96   Temp (!) 97.2 F (36.2 C) (Temporal)   Resp 16   Ht 5\' 11"  (1.803 m)   Wt 181 lb 12.8 oz (82.5 kg)   SpO2 96%   BMI 25.36 kg/m       Objective:   Physical Exam  General Mental Status- Alert. General Appearance- Not in acute distress.   Skin General: Color- Normal Color. Moisture- Normal Moisture.  .  Chest and Lung Exam Auscultation: Breath Sounds:-Normal.  Cardiovascular Auscultation:Rythm- Regular. Murmurs & Other Heart Sounds:Auscultation of the heart reveals- No Murmurs.  Abdomen Inspection:-Inspeection Normal. Palpation/Percussion:Note:No mass. Palpation and Percussion of the abdomen reveal- Non Tender, Non Distended + BS, no rebound or guarding.   Neurologic Cranial Nerve exam:- CN III-XII intact(No nystagmus), symmetric smile. Strength:- 5/5 equal and symmetric strength both upper and lower extremities.  Lower ext- no pedal edema.      Assessment & Plan:  Your blood pressure is well controlled today and recommend that you continue current BP medication regimen.  You do have high cholesterol and your lipids done recently showed some elevated triglycerides.  With your history of smoking or other risk  factors recommend low-cholesterol diet and continue atorvastatin.  You do have history of BPH and have been evaluated by urologist.  They gave you medication which you thought caused side effects of reflux.  When he stopped medications reflux type symptoms resolved.  On follow-up with urologist this coming month recommend discuss with specialist.  They might give alternative medication.  If you do have intermittent reflux symptoms you want to use Tums and declined famotidine presently.  But  if you feel like you need additional medication just let me know and I will send in the prescription.  For history of smoking, I do want you to get chest x-ray today.  Your intermittent cough is likely related to the smoking.  I offered you inhaler today since you report history of COPD in the past but you declined.  If you do want me to send inhaler just let me know and I will do that.  Pneumonia vaccine given today.  Follow-up in 3 to 4 months or as needed.  Note next appointment could be virtual if you choose but would want you to have a blood pressure and a pulse reading on day of virtual visit.  Mackie Pai, [name]

## 2018-12-05 ENCOUNTER — Encounter: Payer: Self-pay | Admitting: Cardiology

## 2018-12-05 ENCOUNTER — Other Ambulatory Visit: Payer: Self-pay

## 2018-12-05 ENCOUNTER — Ambulatory Visit (INDEPENDENT_AMBULATORY_CARE_PROVIDER_SITE_OTHER): Payer: Medicare Other | Admitting: Cardiology

## 2018-12-05 VITALS — BP 102/58 | HR 96 | Ht 71.0 in | Wt 181.2 lb

## 2018-12-05 DIAGNOSIS — I2510 Atherosclerotic heart disease of native coronary artery without angina pectoris: Secondary | ICD-10-CM

## 2018-12-05 DIAGNOSIS — F17200 Nicotine dependence, unspecified, uncomplicated: Secondary | ICD-10-CM

## 2018-12-05 DIAGNOSIS — Z7901 Long term (current) use of anticoagulants: Secondary | ICD-10-CM

## 2018-12-05 DIAGNOSIS — I10 Essential (primary) hypertension: Secondary | ICD-10-CM | POA: Diagnosis not present

## 2018-12-05 DIAGNOSIS — I483 Typical atrial flutter: Secondary | ICD-10-CM | POA: Diagnosis not present

## 2018-12-05 DIAGNOSIS — R0609 Other forms of dyspnea: Secondary | ICD-10-CM

## 2018-12-05 NOTE — Addendum Note (Signed)
Addended by: Ashok Norris on: 12/05/2018 08:37 AM   Modules accepted: Orders

## 2018-12-05 NOTE — Patient Instructions (Signed)
Medication Instructions:  Your physician recommends that you continue on your current medications as directed. Please refer to the Current Medication list given to you today.  If you need a refill on your cardiac medications before your next appointment, please call your pharmacy.   Lab work: None.  If you have labs (blood work) drawn today and your tests are completely normal, you will receive your results only by: . MyChart Message (if you have MyChart) OR . A paper copy in the mail If you have any lab test that is abnormal or we need to change your treatment, we will call you to review the results.  Testing/Procedures: Your physician has requested that you have a carotid duplex. This test is an ultrasound of the carotid arteries in your neck. It looks at blood flow through these arteries that supply the brain with blood. Allow one hour for this exam. There are no restrictions or special instructions.     Follow-Up: At [facility], you and your health needs are our priority.  As part of our continuing mission to provide you with exceptional heart care, we have created designated Provider Care Teams.  These Care Teams include your primary Cardiologist (physician) and Advanced Practice Providers (APPs -  Physician Assistants and Nurse Practitioners) who all work together to provide you with the care you need, when you need it. You will need a follow up appointment in 6 months.  Please call our office 2 months in advance to schedule this appointment.  You may see No primary care provider on file. or another member of our [facility] Provider Team in [place]: Brian Munley, MD . Rajan Revankar, MD  Any Other Special Instructions Will Be Listed Below (If Applicable).     

## 2018-12-05 NOTE — Progress Notes (Signed)
Cardiology Office Note:    Date:  12/05/2018   ID:  [name], DOB 1950/04/17, MRN [id number]  PCP:  Mackie Pai, [name]  Cardiologist:  Jenne Campus, MD    Referring MD: Elise Benne   Chief Complaint  Patient presents with  . Follow-up  Doing better  History of Present Illness:    Robert Lang is a 68 y.o. male with history of coronary artery disease test was PTCA and stenting in 2017, status post atrial flutter ablation came to me a month ago complaining of exertional shortness of breath we did quite extensive evaluation trying to figure out if this is by any chest related to his heart stress test showed no evidence of ischemia, echocardiogram showed preserved left ventricle ejection fraction, monitor did not show any evidence of atrial fibrillation.  Overall I do not see cardiac reason for his shortness of breath.  He is a chronic smoker and he smokes less than 1 pack/day now I told him that this is probably what is responsible for her shortness of breath and I strongly advised him to quit.  He will work on that.  Past Medical History:  Diagnosis Date  . Allergy   . Arthritis   . Depression   . Hyperlipidemia   . Hypertension     Past Surgical History:  Procedure Laterality Date  . CARDIAC ELECTROPHYSIOLOGY STUDY AND ABLATION      Current Medications: Current Meds  Medication Sig  . ASPIRIN 81 PO Take 81 mg by mouth.  Marland Kitchen atorvastatin (LIPITOR) 10 MG tablet Take 1 tablet (10 mg total) by mouth daily.  Marland Kitchen buPROPion (WELLBUTRIN XL) 300 MG 24 hr tablet TAKE 1 TABLET(300 MG) BY MOUTH DAILY  . Cholecalciferol (VITAMIN D-1000 MAX ST) 25 MCG (1000 UT) tablet Take by mouth.  . diltiazem (CARDIZEM CD) 120 MG 24 hr capsule Take 1 capsule (120 mg total) by mouth daily.  . enalapril (VASOTEC) 5 MG tablet TAKE 1 TABLET(5 MG) BY MOUTH DAILY  . fexofenadine (ALLEGRA) 180 MG tablet Take by mouth.  . finasteride (PROSCAR) 5 MG tablet 1 tab po q day  . gabapentin  (NEURONTIN) 100 MG capsule Take 200 mg by mouth.   . loratadine-pseudoephedrine (CLARITIN-D 24-HOUR) 10-240 MG 24 hr tablet Take 1 tablet by mouth as needed for allergies.  . nitroGLYCERIN (NITROSTAT) 0.4 MG SL tablet Place 1 tablet (0.4 mg total) under the tongue every 5 (five) minutes as needed for chest pain.  . Omega-3 Fatty Acids (FISH OIL) 1000 MG CAPS Take 1,000 mg by mouth.  . rivaroxaban (XARELTO) 20 MG TABS tablet Take 1 tablet (20 mg total) by mouth daily with supper.  . sertraline (ZOLOFT) 100 MG tablet   . tamsulosin (FLOMAX) 0.4 MG CAPS capsule TAKE 1 CAPSULE(0.4 MG) BY MOUTH DAILY  . Turmeric 500 MG CAPS Take 1 capsule by mouth daily.  . vitamin B-12 (CYANOCOBALAMIN) 1000 MCG tablet Take 1,000 mcg by mouth daily.     Allergies:   Naproxen   Social History   Socioeconomic History  . Marital status: Married    Spouse name: Not on file  . Number of children: Not on file  . Years of education: Not on file  . Highest education level: Not on file  Occupational History  . Not on file  Social Needs  . Financial resource strain: Not on file  . Food insecurity    Worry: Not on file    Inability: Not on file  . Transportation needs  Medical: Not on file    Non-medical: Not on file  Tobacco Use  . Smoking status: Current Every Day Smoker    Packs/day: 1.00    Years: 30.00    Pack years: 30.00  . Smokeless tobacco: Never Used  Substance and Sexual Activity  . Alcohol use: Yes    Alcohol/week: 2.0 standard drinks    Types: 2 Cans of beer per week    Comment: 2 beers a day.  . Drug use: Never  . Sexual activity: Not on file  Lifestyle  . Physical activity    Days per week: Not on file    Minutes per session: Not on file  . Stress: Not on file  Relationships  . Social Herbalist on phone: Not on file    Gets together: Not on file    Attends religious service: Not on file    Active member of club or organization: Not on file    Attends meetings of  clubs or organizations: Not on file    Relationship status: Not on file  Other Topics Concern  . Not on file  Social History Narrative  . Not on file     Family History: The patient's family history is not on file. ROS:   Please see the history of present illness.    All 14 point review of systems negative except as described per history of present illness  EKGs/Labs/Other Studies Reviewed:      Recent Labs: 09/06/2018: ALT 29; BUN 17; Creatinine, Ser 0.87; Potassium 4.6; Sodium 134  Recent Lipid Panel    Component Value Date/Time   CHOL 124 09/06/2018 0924   TRIG 203.0 (H) 09/06/2018 0924   HDL 40.10 09/06/2018 0924   CHOLHDL 3 09/06/2018 0924   VLDL 40.6 (H) 09/06/2018 0924   LDLDIRECT 66.0 09/06/2018 0924    Physical Exam:    VS:  BP (!) 102/58   Pulse 96   Ht 5\' 11"  (1.803 m)   Wt 181 lb 3.2 oz (82.2 kg)   SpO2 96%   BMI 25.27 kg/m     Wt Readings from Last 3 Encounters:  12/05/18 181 lb 3.2 oz (82.2 kg)  11/23/18 181 lb 12.8 oz (82.5 kg)  10/30/18 182 lb (82.6 kg)     GEN:  Well nourished, well developed in no acute distress HEENT: Normal NECK: No JVD; soft left carotid bruits LYMPHATICS: No lymphadenopathy CARDIAC: RRR, no murmurs, no rubs, no gallops RESPIRATORY:  Clear to auscultation without rales, wheezing or rhonchi  ABDOMEN: Soft, non-tender, non-distended MUSCULOSKELETAL:  No edema; No deformity  SKIN: Warm and dry LOWER EXTREMITIES: no swelling NEUROLOGIC:  Alert and oriented x 3 PSYCHIATRIC:  Normal affect   ASSESSMENT:    1. Coronary artery disease involving native coronary artery of native heart without angina pectoris   2. Essential hypertension   3. Typical atrial flutter (HCC)   4. Smoking   5. Anticoagulated   6. Dyspnea on exertion    PLAN:    In order of problems listed above:  1. Coronary disease doing well from that point review stress test negative. 2. Essential hypertension blood pressure well controlled we will  continue present management 3. History of typical atrial flutter status post ablation asymptomatic monitor did not show any evidence of atrial flutter 4. Smoking obviously big problem we spent at least 5 minutes talking about this and I strongly recommended to quit. 5. Anticoagulation related to atrial flutter.  We will continue  his chads 2 vascular equals 3. 6. Dyspnea on exertion multifactorial but it looks like smoking and/COPD played the most significant role.  All testing done for his heart were negative. 7. Soft carotid bruit I will ask him to have carotic ultrasound.   Medication Adjustments/Labs and Tests Ordered: Current medicines are reviewed at length with the patient today.  Concerns regarding medicines are outlined above.  No orders of the defined types were placed in this encounter.  Medication changes: No orders of the defined types were placed in this encounter.   Signed, Park Liter, MD, [facility] 12/05/2018 8:29 AM    [place]

## 2018-12-12 ENCOUNTER — Other Ambulatory Visit: Payer: Self-pay

## 2018-12-12 ENCOUNTER — Ambulatory Visit (HOSPITAL_BASED_OUTPATIENT_CLINIC_OR_DEPARTMENT_OTHER)
Admission: RE | Admit: 2018-12-12 | Discharge: 2018-12-12 | Disposition: A | Discharge status: Home / Self Care | Payer: Medicare Other | Source: Ambulatory Visit | Attending: Cardiology | Admitting: Cardiology

## 2018-12-12 DIAGNOSIS — I2510 Atherosclerotic heart disease of native coronary artery without angina pectoris: Secondary | ICD-10-CM | POA: Diagnosis present

## 2018-12-12 NOTE — Progress Notes (Signed)
Bilateral carotid doppler performed    12/12/18 Cardell Peach RDCS, RVT

## 2019-01-10 ENCOUNTER — Ambulatory Visit: Payer: Self-pay

## 2019-01-10 ENCOUNTER — Emergency Department (HOSPITAL_BASED_OUTPATIENT_CLINIC_OR_DEPARTMENT_OTHER): Payer: Medicare Other

## 2019-01-10 ENCOUNTER — Inpatient Hospital Stay (HOSPITAL_BASED_OUTPATIENT_CLINIC_OR_DEPARTMENT_OTHER)
Admission: EM | Admit: 2019-01-10 | Discharge: 2019-01-12 | DRG: 247 | Disposition: A | Discharge status: Home / Self Care | Payer: Medicare Other | Attending: Interventional Cardiology | Admitting: Interventional Cardiology

## 2019-01-10 ENCOUNTER — Encounter (HOSPITAL_BASED_OUTPATIENT_CLINIC_OR_DEPARTMENT_OTHER): Payer: Self-pay | Admitting: *Deleted

## 2019-01-10 ENCOUNTER — Inpatient Hospital Stay (HOSPITAL_COMMUNITY)
Admission: EM | Disposition: A | Discharge status: Home / Self Care | Payer: Self-pay | Source: Home / Self Care | Attending: Cardiology

## 2019-01-10 ENCOUNTER — Other Ambulatory Visit: Payer: Self-pay

## 2019-01-10 DIAGNOSIS — F329 Major depressive disorder, single episode, unspecified: Secondary | ICD-10-CM | POA: Diagnosis present

## 2019-01-10 DIAGNOSIS — Z7982 Long term (current) use of aspirin: Secondary | ICD-10-CM

## 2019-01-10 DIAGNOSIS — E785 Hyperlipidemia, unspecified: Secondary | ICD-10-CM | POA: Diagnosis present

## 2019-01-10 DIAGNOSIS — I4892 Unspecified atrial flutter: Secondary | ICD-10-CM | POA: Diagnosis present

## 2019-01-10 DIAGNOSIS — Z7901 Long term (current) use of anticoagulants: Secondary | ICD-10-CM

## 2019-01-10 DIAGNOSIS — I241 Dressler's syndrome: Secondary | ICD-10-CM | POA: Diagnosis present

## 2019-01-10 DIAGNOSIS — Z79899 Other long term (current) drug therapy: Secondary | ICD-10-CM | POA: Diagnosis not present

## 2019-01-10 DIAGNOSIS — F17210 Nicotine dependence, cigarettes, uncomplicated: Secondary | ICD-10-CM | POA: Diagnosis present

## 2019-01-10 DIAGNOSIS — I2111 ST elevation (STEMI) myocardial infarction involving right coronary artery: Secondary | ICD-10-CM

## 2019-01-10 DIAGNOSIS — Z886 Allergy status to analgesic agent status: Secondary | ICD-10-CM | POA: Diagnosis not present

## 2019-01-10 DIAGNOSIS — Z20828 Contact with and (suspected) exposure to other viral communicable diseases: Secondary | ICD-10-CM | POA: Diagnosis present

## 2019-01-10 DIAGNOSIS — I361 Nonrheumatic tricuspid (valve) insufficiency: Secondary | ICD-10-CM | POA: Diagnosis not present

## 2019-01-10 DIAGNOSIS — I25110 Atherosclerotic heart disease of native coronary artery with unstable angina pectoris: Secondary | ICD-10-CM | POA: Diagnosis present

## 2019-01-10 DIAGNOSIS — I2119 ST elevation (STEMI) myocardial infarction involving other coronary artery of inferior wall: Principal | ICD-10-CM

## 2019-01-10 DIAGNOSIS — Z955 Presence of coronary angioplasty implant and graft: Secondary | ICD-10-CM | POA: Diagnosis not present

## 2019-01-10 DIAGNOSIS — I10 Essential (primary) hypertension: Secondary | ICD-10-CM | POA: Diagnosis present

## 2019-01-10 DIAGNOSIS — I213 ST elevation (STEMI) myocardial infarction of unspecified site: Secondary | ICD-10-CM

## 2019-01-10 DIAGNOSIS — I2510 Atherosclerotic heart disease of native coronary artery without angina pectoris: Secondary | ICD-10-CM | POA: Diagnosis not present

## 2019-01-10 DIAGNOSIS — R0789 Other chest pain: Secondary | ICD-10-CM | POA: Diagnosis present

## 2019-01-10 DIAGNOSIS — R079 Chest pain, unspecified: Secondary | ICD-10-CM

## 2019-01-10 HISTORY — DX: ST elevation (STEMI) myocardial infarction involving other coronary artery of inferior wall: I21.19

## 2019-01-10 HISTORY — PX: CORONARY/GRAFT ACUTE MI REVASCULARIZATION: CATH118305

## 2019-01-10 HISTORY — PX: LEFT HEART CATH AND CORONARY ANGIOGRAPHY: CATH118249

## 2019-01-10 LAB — LIPID PANEL
Cholesterol: 122 mg/dL (ref 0–200)
HDL: 36 mg/dL — ABNORMAL LOW (ref >40)
Total CHOL/HDL Ratio: 3.4 RATIO
Triglycerides: 215 mg/dL — ABNORMAL HIGH (ref <150)
VLDL: 43 mg/dL — ABNORMAL HIGH (ref 0–40)

## 2019-01-10 LAB — PROTIME-INR
INR: 1.3 — ABNORMAL HIGH (ref 0.8–1.2)
Prothrombin Time: 15.8 seconds — ABNORMAL HIGH (ref 11.4–15.2)

## 2019-01-10 LAB — CBC
HCT: 49 % (ref 39.0–52.0)
Hemoglobin: 17 g/dL (ref 13.0–17.0)
MCH: 33.8 pg (ref 26.0–34.0)
MCHC: 34.7 g/dL (ref 30.0–36.0)
MCV: 97.4 fL (ref 80.0–100.0)
Platelets: 229 10*3/uL (ref 150–400)
RBC: 5.03 MIL/uL (ref 4.22–5.81)
RDW: 12.9 % (ref 11.5–15.5)
WBC: 10.4 10*3/uL (ref 4.0–10.5)
nRBC: 0 % (ref 0.0–0.2)

## 2019-01-10 LAB — BASIC METABOLIC PANEL
Anion gap: 15 (ref 5–15)
BUN: 10 mg/dL (ref 8–23)
CO2: 20 mmol/L — ABNORMAL LOW (ref 22–32)
Calcium: 9.9 mg/dL (ref 8.9–10.3)
Chloride: 96 mmol/L — ABNORMAL LOW (ref 98–111)
Creatinine, Ser: 0.76 mg/dL (ref 0.61–1.24)
GFR calc Af Amer: >60 mL/min (ref >60)
GFR calc non Af Amer: >60 mL/min (ref >60)
Glucose, Bld: 92 mg/dL (ref 70–99)
Potassium: 3.8 mmol/L (ref 3.5–5.1)
Sodium: 131 mmol/L — ABNORMAL LOW (ref 135–145)

## 2019-01-10 LAB — TROPONIN I (HIGH SENSITIVITY)
Troponin I (High Sensitivity): 4060 ng/L (ref <18)
Troponin I (High Sensitivity): 12883 ng/L (ref <18)

## 2019-01-10 LAB — MRSA PCR SCREENING: MRSA by PCR: NEGATIVE

## 2019-01-10 LAB — APTT: aPTT: 40 seconds — ABNORMAL HIGH (ref 24–36)

## 2019-01-10 LAB — HEPARIN LEVEL (UNFRACTIONATED): Heparin Unfractionated: 1.9 IU/mL — ABNORMAL HIGH (ref 0.30–0.70)

## 2019-01-10 LAB — POCT ACTIVATED CLOTTING TIME: Activated Clotting Time: 367 seconds

## 2019-01-10 LAB — SARS CORONAVIRUS 2 BY RT PCR (HOSPITAL ORDER, PERFORMED IN ~~LOC~~ HOSPITAL LAB): SARS Coronavirus 2: NEGATIVE

## 2019-01-10 SURGERY — CORONARY/GRAFT ACUTE MI REVASCULARIZATION
Anesthesia: LOCAL

## 2019-01-10 MED ORDER — SODIUM CHLORIDE 0.9 % IV SOLN
INTRAVENOUS | Status: DC
  Administered 2019-01-10: 14:00:00 via INTRAVENOUS

## 2019-01-10 MED ORDER — HEPARIN SODIUM (PORCINE) 1000 UNIT/ML IJ SOLN
INTRAMUSCULAR | Status: DC | PRN
  Administered 2019-01-10 (×2): 4000 [IU] via INTRAVENOUS

## 2019-01-10 MED ORDER — TURMERIC 500 MG PO CAPS: 1.0000 | ORAL_CAPSULE | Freq: Every day | ORAL | Status: DC

## 2019-01-10 MED ORDER — FENTANYL CITRATE (PF) 100 MCG/2ML IJ SOLN
50.0000 ug | Freq: Once | INTRAMUSCULAR | Status: DC
  Filled 2019-01-10: qty 2

## 2019-01-10 MED ORDER — MIDAZOLAM HCL 2 MG/2ML IJ SOLN
INTRAMUSCULAR | Status: AC
  Filled 2019-01-10: qty 2

## 2019-01-10 MED ORDER — HEPARIN SODIUM (PORCINE) 5000 UNIT/ML IJ SOLN: 4000.0000 [IU] | Freq: Once | INTRAMUSCULAR | Status: DC

## 2019-01-10 MED ORDER — LABETALOL HCL 5 MG/ML IV SOLN: 10.0000 mg | INTRAVENOUS | Status: AC | PRN

## 2019-01-10 MED ORDER — RIVAROXABAN 20 MG PO TABS
20.0000 mg | ORAL_TABLET | Freq: Every day | ORAL | Status: DC
  Administered 2019-01-11 – 2019-01-12 (×2): 20 mg via ORAL
  Filled 2019-01-10 (×2): qty 1

## 2019-01-10 MED ORDER — VITAMIN B-12 1000 MCG PO TABS
1000.0000 ug | ORAL_TABLET | Freq: Every day | ORAL | Status: DC
  Administered 2019-01-11 – 2019-01-12 (×2): 1000 ug via ORAL
  Filled 2019-01-10 (×2): qty 1

## 2019-01-10 MED ORDER — HYDROMORPHONE HCL 1 MG/ML IJ SOLN
0.5000 mg | INTRAMUSCULAR | Status: DC | PRN
  Administered 2019-01-10 – 2019-01-11 (×5): 0.5 mg via INTRAVENOUS
  Filled 2019-01-10 (×5): qty 0.5

## 2019-01-10 MED ORDER — CLOPIDOGREL BISULFATE 75 MG PO TABS
75.0000 mg | ORAL_TABLET | Freq: Every day | ORAL | Status: DC
  Administered 2019-01-11 – 2019-01-12 (×2): 75 mg via ORAL
  Filled 2019-01-10 (×2): qty 1

## 2019-01-10 MED ORDER — SERTRALINE HCL 100 MG PO TABS
100.0000 mg | ORAL_TABLET | Freq: Every day | ORAL | Status: DC
  Administered 2019-01-10 – 2019-01-11 (×2): 100 mg via ORAL
  Filled 2019-01-10 (×2): qty 1

## 2019-01-10 MED ORDER — VERAPAMIL HCL 2.5 MG/ML IV SOLN
INTRAVENOUS | Status: DC | PRN
  Administered 2019-01-10: 10 mL via INTRA_ARTERIAL

## 2019-01-10 MED ORDER — RIVAROXABAN 20 MG PO TABS: 20.0000 mg | ORAL_TABLET | Freq: Every day | ORAL | Status: DC

## 2019-01-10 MED ORDER — IOHEXOL 350 MG/ML SOLN
INTRAVENOUS | Status: DC | PRN
  Administered 2019-01-10: 145 mL via INTRA_ARTERIAL

## 2019-01-10 MED ORDER — BUPROPION HCL ER (XL) 150 MG PO TB24
300.0000 mg | ORAL_TABLET | Freq: Every day | ORAL | Status: DC
  Administered 2019-01-11 – 2019-01-12 (×2): 300 mg via ORAL
  Filled 2019-01-10: qty 2
  Filled 2019-01-10: qty 1

## 2019-01-10 MED ORDER — CLOPIDOGREL BISULFATE 300 MG PO TABS
ORAL_TABLET | ORAL | Status: DC | PRN
  Administered 2019-01-10: 600 mg via ORAL

## 2019-01-10 MED ORDER — FENTANYL CITRATE (PF) 100 MCG/2ML IJ SOLN
INTRAMUSCULAR | Status: DC | PRN
  Administered 2019-01-10: 50 ug via INTRAVENOUS
  Administered 2019-01-10: 25 ug via INTRAVENOUS

## 2019-01-10 MED ORDER — FENTANYL CITRATE (PF) 100 MCG/2ML IJ SOLN
INTRAMUSCULAR | Status: AC
  Filled 2019-01-10: qty 2

## 2019-01-10 MED ORDER — HEPARIN (PORCINE) 25000 UT/250ML-% IV SOLN
1000.0000 [IU]/h | INTRAVENOUS | Status: DC
  Administered 2019-01-10: 1000 [IU]/h via INTRAVENOUS

## 2019-01-10 MED ORDER — TIROFIBAN HCL IN NACL 5-0.9 MG/100ML-% IV SOLN
INTRAVENOUS | Status: AC
  Filled 2019-01-10: qty 100

## 2019-01-10 MED ORDER — HYDROMORPHONE HCL 1 MG/ML IJ SOLN
INTRAMUSCULAR | Status: AC
  Filled 2019-01-10: qty 0.5

## 2019-01-10 MED ORDER — LORATADINE 10 MG PO TABS
10.0000 mg | ORAL_TABLET | Freq: Every day | ORAL | Status: DC
  Administered 2019-01-11 – 2019-01-12 (×2): 10 mg via ORAL
  Filled 2019-01-10 (×2): qty 1

## 2019-01-10 MED ORDER — SODIUM CHLORIDE 0.9 % IV SOLN
INTRAVENOUS | Status: AC
  Administered 2019-01-10: 20:00:00 via INTRAVENOUS

## 2019-01-10 MED ORDER — LIDOCAINE HCL (PF) 1 % IJ SOLN
INTRAMUSCULAR | Status: DC | PRN
  Administered 2019-01-10: 2 mL

## 2019-01-10 MED ORDER — HEPARIN (PORCINE) IN NACL 1000-0.9 UT/500ML-% IV SOLN
INTRAVENOUS | Status: AC
  Filled 2019-01-10: qty 1000

## 2019-01-10 MED ORDER — MIDAZOLAM HCL 2 MG/2ML IJ SOLN
INTRAMUSCULAR | Status: DC | PRN
  Administered 2019-01-10: 2 mg via INTRAVENOUS
  Administered 2019-01-10: 1 mg via INTRAVENOUS

## 2019-01-10 MED ORDER — TIROFIBAN (AGGRASTAT) BOLUS VIA INFUSION
INTRAVENOUS | Status: DC | PRN
  Administered 2019-01-10: 2040 ug via INTRAVENOUS

## 2019-01-10 MED ORDER — ASPIRIN 81 MG PO CHEW
324.0000 mg | CHEWABLE_TABLET | Freq: Once | ORAL | Status: AC
  Administered 2019-01-10: 324 mg via ORAL

## 2019-01-10 MED ORDER — LIDOCAINE HCL (PF) 1 % IJ SOLN
INTRAMUSCULAR | Status: AC
  Filled 2019-01-10: qty 30

## 2019-01-10 MED ORDER — DILTIAZEM HCL ER COATED BEADS 120 MG PO CP24
120.0000 mg | ORAL_CAPSULE | Freq: Every day | ORAL | Status: DC
  Administered 2019-01-11: 120 mg via ORAL
  Filled 2019-01-10: qty 1

## 2019-01-10 MED ORDER — ALUM & MAG HYDROXIDE-SIMETH 200-200-20 MG/5ML PO SUSP
ORAL | Status: AC
  Filled 2019-01-10: qty 30

## 2019-01-10 MED ORDER — TAMSULOSIN HCL 0.4 MG PO CAPS
0.4000 mg | ORAL_CAPSULE | Freq: Every day | ORAL | Status: DC
  Administered 2019-01-11 – 2019-01-12 (×2): 0.4 mg via ORAL
  Filled 2019-01-10 (×2): qty 1

## 2019-01-10 MED ORDER — ONDANSETRON HCL 4 MG/2ML IJ SOLN
4.0000 mg | Freq: Four times a day (QID) | INTRAMUSCULAR | Status: DC | PRN
  Administered 2019-01-11: 4 mg via INTRAVENOUS
  Filled 2019-01-10: qty 2

## 2019-01-10 MED ORDER — CHLORHEXIDINE GLUCONATE CLOTH 2 % EX PADS
6.0000 | MEDICATED_PAD | Freq: Every day | CUTANEOUS | Status: DC
  Administered 2019-01-10: 6 via TOPICAL

## 2019-01-10 MED ORDER — ENALAPRIL MALEATE 5 MG PO TABS
5.0000 mg | ORAL_TABLET | Freq: Every day | ORAL | Status: DC
  Administered 2019-01-11 – 2019-01-12 (×2): 5 mg via ORAL
  Filled 2019-01-10 (×2): qty 1

## 2019-01-10 MED ORDER — TIROFIBAN HCL IN NACL 5-0.9 MG/100ML-% IV SOLN
INTRAVENOUS | Status: AC | PRN
  Administered 2019-01-10: 0.15 ug/kg/min via INTRAVENOUS

## 2019-01-10 MED ORDER — CLOPIDOGREL BISULFATE 300 MG PO TABS
ORAL_TABLET | ORAL | Status: AC
  Filled 2019-01-10: qty 2

## 2019-01-10 MED ORDER — ASPIRIN 81 MG PO CHEW
CHEWABLE_TABLET | ORAL | Status: AC
  Filled 2019-01-10: qty 4

## 2019-01-10 MED ORDER — HEPARIN BOLUS VIA INFUSION
4000.0000 [IU] | Freq: Once | INTRAVENOUS | Status: AC
  Administered 2019-01-10: 14:00:00 4000 [IU] via INTRAVENOUS

## 2019-01-10 MED ORDER — HYDRALAZINE HCL 20 MG/ML IJ SOLN: 10.0000 mg | INTRAMUSCULAR | Status: AC | PRN

## 2019-01-10 MED ORDER — SODIUM CHLORIDE 0.9 % IV SOLN: 250.0000 mL | INTRAVENOUS | Status: DC | PRN

## 2019-01-10 MED ORDER — VERAPAMIL HCL 2.5 MG/ML IV SOLN
INTRAVENOUS | Status: AC
  Filled 2019-01-10: qty 2

## 2019-01-10 MED ORDER — ACETAMINOPHEN 325 MG PO TABS: 650.0000 mg | ORAL_TABLET | ORAL | Status: DC | PRN

## 2019-01-10 MED ORDER — TIROFIBAN HCL IN NACL 5-0.9 MG/100ML-% IV SOLN: 0.1500 ug/kg/min | INTRAVENOUS | Status: AC

## 2019-01-10 MED ORDER — HEPARIN SODIUM (PORCINE) 1000 UNIT/ML IJ SOLN
INTRAMUSCULAR | Status: AC
  Filled 2019-01-10: qty 1

## 2019-01-10 MED ORDER — ATORVASTATIN CALCIUM 80 MG PO TABS
80.0000 mg | ORAL_TABLET | Freq: Every day | ORAL | Status: DC
  Administered 2019-01-10 – 2019-01-11 (×2): 80 mg via ORAL
  Filled 2019-01-10 (×2): qty 1

## 2019-01-10 MED ORDER — SODIUM CHLORIDE 0.9% FLUSH
3.0000 mL | Freq: Two times a day (BID) | INTRAVENOUS | Status: DC
  Administered 2019-01-11 – 2019-01-12 (×2): 3 mL via INTRAVENOUS

## 2019-01-10 MED ORDER — HEPARIN (PORCINE) IN NACL 1000-0.9 UT/500ML-% IV SOLN
INTRAVENOUS | Status: DC | PRN
  Administered 2019-01-10 (×2): 500 mL

## 2019-01-10 MED ORDER — HYDROMORPHONE HCL 1 MG/ML IJ SOLN
INTRAMUSCULAR | Status: DC | PRN
  Administered 2019-01-10: 0.5 mg via INTRAVENOUS

## 2019-01-10 MED ORDER — NITROGLYCERIN 0.4 MG SL SUBL
0.4000 mg | SUBLINGUAL_TABLET | SUBLINGUAL | Status: DC | PRN
  Administered 2019-01-10 – 2019-01-11 (×3): 0.4 mg via SUBLINGUAL
  Filled 2019-01-10 (×3): qty 1

## 2019-01-10 MED ORDER — SODIUM CHLORIDE 0.9% FLUSH: 3.0000 mL | INTRAVENOUS | Status: DC | PRN

## 2019-01-10 MED ORDER — ASPIRIN 81 MG PO CHEW
81.0000 mg | CHEWABLE_TABLET | Freq: Every day | ORAL | Status: DC
  Administered 2019-01-11 – 2019-01-12 (×2): 81 mg via ORAL
  Filled 2019-01-10 (×2): qty 1

## 2019-01-10 MED ORDER — HEPARIN (PORCINE) 25000 UT/250ML-% IV SOLN
INTRAVENOUS | Status: AC
  Administered 2019-01-10: 4000 [IU] via INTRAVENOUS
  Filled 2019-01-10: qty 250

## 2019-01-10 SURGICAL SUPPLY — 22 items
BALLN SAPPHIRE 2.0X15 (BALLOONS) ×2
BALLN SAPPHIRE ~~LOC~~ 2.75X15 (BALLOONS) ×1 IMPLANT
BALLOON SAPPHIRE 2.0X15 (BALLOONS) IMPLANT
CATH INFINITI 5FR ANG PIGTAIL (CATHETERS) ×1 IMPLANT
CATH INFINITI JR4 5F (CATHETERS) ×1 IMPLANT
CATH OPTITORQUE TIG 4.0 5F (CATHETERS) ×1 IMPLANT
CATH VISTA GUIDE 6FR JR4 (CATHETERS) ×1 IMPLANT
DEVICE RAD COMP TR BAND LRG (VASCULAR PRODUCTS) ×1 IMPLANT
ELECT DEFIB PAD ADLT CADENCE (PAD) ×1 IMPLANT
GLIDESHEATH SLEND SS 6F .021 (SHEATH) ×1 IMPLANT
GUIDEWIRE INQWIRE 1.5J.035X260 (WIRE) IMPLANT
INQWIRE 1.5J .035X260CM (WIRE) ×2
KIT ENCORE 26 ADVANTAGE (KITS) ×1 IMPLANT
KIT HEART LEFT (KITS) ×2 IMPLANT
PACK CARDIAC CATHETERIZATION (CUSTOM PROCEDURE TRAY) ×2 IMPLANT
STENT RESOLUTE ONYX 2.5X34 (Permanent Stent) ×1 IMPLANT
STENT RESOLUTE ONYX 2.75X12 (Permanent Stent) ×1 IMPLANT
TRANSDUCER W/STOPCOCK (MISCELLANEOUS) ×2 IMPLANT
TUBING CIL FLEX 10 FLL-RA (TUBING) ×2 IMPLANT
WIRE ASAHI PROWATER 180CM (WIRE) ×1 IMPLANT
WIRE FIGHTER CROSSING 190CM (WIRE) ×1 IMPLANT
WIRE HI TORQ WHISPER MS 190CM (WIRE) ×1 IMPLANT

## 2019-01-10 NOTE — ED Provider Notes (Signed)
[facility] EMERGENCY DEPARTMENT Provider Note   CSN: [id number] Arrival date & time: 01/10/19  1311     History   Chief Complaint Chief Complaint  Patient presents with  . Chest Pain    HPI Robert Lang is a 68 y.o. male.  Present for department chief complaint chest pain.  Patient states symptoms started last night, at rest, has had associated nausea but no vomiting.  Had an episode of diaphoresis.  Has shortness of breath with exertion.  Took 1 nitro last night with no significant change in symptoms.  This morning continue to have pain.  Currently pain is 4 out of 10 in severity, nonradiating, central, chest tightness/pressure.  Not currently having any shortness of breath.  Took 1 baby aspirin per her usual regimen this morning.  Patient reports that he has had prior history of coronary artery disease, has had multiple stents placed, this was performed in [place].  On reassessment, chest pain was 0 out of 10.     HPI  Past Medical History:  Diagnosis Date  . Allergy   . Arthritis   . Depression   . Hyperlipidemia   . Hypertension     Patient Active Problem List   Diagnosis Date Noted  . Dyspnea on exertion 10/20/2018  . Status post ablation of atrial flutter 07/06/2018  . Coronary artery disease 07/06/2018  . Essential hypertension 07/06/2018  . Dyslipidemia 07/06/2018  . Smoking 07/06/2018  . Neuropathy 07/06/2018  . Dizziness 10/28/2017  . Falls 10/28/2017  . Heart palpitations 01/27/2017  . Anticoagulated 07/01/2016  . H/O amiodarone therapy 07/01/2016  . PLMD (periodic limb movement disorder) 11/04/2015  . Obstructive sleep apnea 08/05/2015  . Atrial flutter ([place]) 06/26/2015  . Chest pain 06/26/2015  . COPD (chronic obstructive pulmonary disease) ([place]) 06/26/2015  . 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 Allergic rhinitis due to pollen 09/03/2013  . DDD (degenerative disc disease), cervical 09/03/2013  . Depressive disorder  09/03/2013  . Esophageal reflux 09/03/2013  . Neck pain 09/03/2013  . Polycythemia, secondary 09/03/2013  . Pure hypercholesterolemia 09/03/2013  . Screening for prostate cancer 09/03/2013    Past Surgical History:  Procedure Laterality Date  . CARDIAC ELECTROPHYSIOLOGY STUDY AND ABLATION          Home Medications    Prior to Admission medications   Medication Sig Start Date End Date Taking? Authorizing Provider  ASPIRIN 81 PO Take 81 mg by mouth.    [provider]  atorvastatin (LIPITOR) 10 MG tablet Take 1 tablet (10 mg total) by mouth daily. 11/23/18   [name], Percell Miller, [name]  buPROPion (WELLBUTRIN XL) 300 MG 24 hr tablet TAKE 1 TABLET(300 MG) BY MOUTH DAILY 10/02/18   [name], Percell Miller, [name]  Cholecalciferol (VITAMIN D-1000 MAX ST) 25 MCG (1000 UT) tablet Take by mouth.    [provider]  diltiazem (CARDIZEM CD) 120 MG 24 hr capsule Take 1 capsule (120 mg total) by mouth daily. 10/11/18 01/09/19  Park Liter, MD  enalapril (VASOTEC) 5 MG tablet TAKE 1 TABLET(5 MG) BY MOUTH DAILY 11/23/18   [name], Percell Miller, [name]  fexofenadine (ALLEGRA) 180 MG tablet Take by mouth. 04/09/08   [provider]  finasteride (PROSCAR) 5 MG tablet 1 tab po q day 08/23/18   [name], Percell Miller, [name]  gabapentin (NEURONTIN) 100 MG capsule Take 200 mg by mouth.     [provider]  loratadine-pseudoephedrine (CLARITIN-D 24-HOUR) 10-240 MG 24 hr tablet Take 1 tablet by mouth as needed for  allergies.    [provider]  nitroGLYCERIN (NITROSTAT) 0.4 MG SL tablet Place 1 tablet (0.4 mg total) under the tongue every 5 (five) minutes as needed for chest pain. 10/11/18   Park Liter, MD  Omega-3 Fatty Acids (FISH OIL) 1000 MG CAPS Take 1,000 mg by mouth.    [provider]  rivaroxaban (XARELTO) 20 MG TABS tablet Take 1 tablet (20 mg total) by mouth daily with supper. 11/23/18   [name], Percell Miller, [name]  sertraline (ZOLOFT) 100 MG tablet  06/13/18   [provider]  tamsulosin (FLOMAX) 0.4 MG CAPS capsule TAKE 1 CAPSULE(0.4 MG) BY MOUTH DAILY 11/23/18   [name], Percell Miller, [name]  Turmeric 500 MG CAPS Take 1 capsule by mouth daily.    [provider]  vitamin B-12 (CYANOCOBALAMIN) 1000 MCG tablet Take 1,000 mcg by mouth daily.    [provider]    Family History History reviewed. No pertinent family history.  Social History Social History   Tobacco Use  . Smoking status: Current Every Day Smoker    Packs/day: 1.00    Years: 30.00    Pack years: 30.00  . Smokeless tobacco: Never Used  Substance Use Topics  . Alcohol use: Yes    Alcohol/week: 2.0 standard drinks    Types: 2 Cans of beer per week    Comment: 2 beers a day.  . Drug use: Never     Allergies   Naproxen   Review of Systems Review of Systems  Constitutional: Positive for diaphoresis. Negative for chills and fever.  HENT: Negative for ear pain and sore throat.   Eyes: Negative for pain and visual disturbance.  Respiratory: Positive for shortness of breath. Negative for cough.   Cardiovascular: Positive for chest pain. Negative for palpitations.  Gastrointestinal: Negative for abdominal pain and vomiting.  Genitourinary: Negative for dysuria and hematuria.  Musculoskeletal: Negative for arthralgias and back pain.  Skin: Negative for color change and rash.  Neurological: Negative for seizures and syncope.  All other systems reviewed and are negative.    Physical Exam Updated Vital Signs BP 116/80   Pulse (!) 115   Temp 98 F (36.7 C)   Resp 16   Ht 5\' 11"  (1.803 m)   Wt 81.6 kg   BMI 25.10 kg/m   Physical Exam Vitals signs and nursing note reviewed.  Constitutional:      Appearance: He is well-developed.  HENT:     Head: Normocephalic and atraumatic.  Eyes:     Conjunctiva/sclera: Conjunctivae normal.  Neck:     Musculoskeletal: Neck supple.  Cardiovascular:     Rate and Rhythm: Normal rate and regular rhythm.     Heart  sounds: No murmur.  Pulmonary:     Effort: Pulmonary effort is normal. No respiratory distress.     Breath sounds: Normal breath sounds.  Abdominal:     Palpations: Abdomen is soft.     Tenderness: There is no abdominal tenderness.  Musculoskeletal:     Right lower leg: No edema.     Left lower leg: No edema.  Skin:    General: Skin is warm and dry.     Capillary Refill: Capillary refill takes less than 2 seconds.  Neurological:     General: No focal deficit present.     Mental Status: He is alert and oriented to person, place, and time.  Psychiatric:        Mood and Affect: Mood normal.  Behavior: Behavior normal.      ED Treatments / Results  Labs (all labs ordered are listed, but only abnormal results are displayed) Labs Reviewed  SARS CORONAVIRUS 2 BY RT PCR (HOSPITAL ORDER, [place] [facility])  CBC  BASIC METABOLIC PANEL  PROTIME-INR  APTT  HEPARIN LEVEL (UNFRACTIONATED)  LIPID PANEL  TROPONIN I (HIGH SENSITIVITY)    EKG EKG Interpretation  Date/Time:  Wednesday January 10 2019 13:22:17 EDT Ventricular Rate:  117 PR Interval:    QRS Duration: 94 QT Interval:  368 QTC Calculation: 514 R Axis:   -55 Text Interpretation:  Sinus tachycardia Inferoposterior infarct, acute (RCA) Lateral infarct, acute Borderline ST elevation, anterior leads Prolonged QT interval Probable RV involvement, suggest recording right precordial leads >>> Acute MI <<< ST elevations in II, III, aVF, concern for acute STEMI Confirmed by Madalyn Rob [phone]) on 01/10/2019 1:47:36 PM   Radiology No results found.  Procedures .Critical Care Performed by: Lucrezia Starch, MD Authorized by: Lucrezia Starch, MD   Critical care provider statement:    Critical care time (minutes):  31   Critical care was necessary to treat or prevent imminent or life-threatening deterioration of the following conditions:  Cardiac failure   Critical care was time spent  personally by me on the following activities:  Discussions with consultants, evaluation of patient's response to treatment, examination of patient, ordering and performing treatments and interventions, ordering and review of laboratory studies, ordering and review of radiographic studies, pulse oximetry, re-evaluation of patient's condition, obtaining history from patient or surrogate, review of old charts, blood draw for specimens and development of treatment plan with patient or surrogate   (including critical care time)  Medications Ordered in ED Medications  aspirin 81 MG chewable tablet (has no administration in time range)  0.9 %  sodium chloride infusion ( Intravenous New Bag/Given 01/10/19 1339)  fentaNYL (SUBLIMAZE) injection 50 mcg (0 mcg Intravenous Hold 01/10/19 1340)  heparin ADULT infusion 100 units/mL (25000 units/290mL sodium chloride 0.45%) (1,000 Units/hr Intravenous New Bag/Given 01/10/19 1342)  aspirin chewable tablet 324 mg (324 mg Oral Given 01/10/19 1332)  heparin bolus via infusion 4,000 Units (4,000 Units Intravenous Bolus from Bag 01/10/19 1342)     Initial Impression / Assessment and Plan / ED Course  I have reviewed the triage vital signs and the nursing notes.  Pertinent labs & imaging results that were available during my care of the patient were reviewed by me and considered in my medical decision making (see chart for details).  Clinical Course as of Jan 09 1346  Wed Jan 10, 2019  1323 Reviewed EKG and compared to prior, new elevation in 2, 3, aVF, will call code STEMI   [RD]  1330 Notified by [facility], Dr. Burt Knack will accept, to go directly to Cath Lab   [RD]  1340 Evaluated patient at bedside, obtaining EKG, patient complaining of 4 out of 10 chest pain, reports prior history of CAD s/p stent   [RD]  1345 EMS at bedside for transport   [RD]  1346 Wife now at bedside, updated on plan to transfer to [facility]    [RD]    Clinical Course User Index [RD]  Lucrezia Starch, MD       68 year old male presents to ER with chest pain.  Prior history of coronary artery disease at outside facility.  On review of initial EKG, concern for new ST elevation in inferior leads.  Called code STEMI.  Gave aspirin,  heparin bolus.  Prior to receiving pain medicine his pain had spontaneously subsided.  Dr.Cooper will be the accepting doctor.  Patient will go directly to Cath Lab.  Here he remained hemodynamically stable, mildly tachycardic.  Final Clinical Impressions(s) / ED Diagnoses   Final diagnoses:  Acute ST elevation myocardial infarction (STEMI), unspecified artery (HCC)  Coronary artery disease involving native heart, angina presence unspecified, unspecified vessel or lesion type    ED Discharge Orders    None       Lucrezia Starch, MD 01/10/19 1356

## 2019-01-10 NOTE — H&P (Addendum)
Cardiology Admission History and Physical:   Patient ID: Even Polio; MRN: [id number]; DOB: [birth year]   Admission date: 01/10/2019  Primary Care Provider: Mackie Pai, [name] Primary Cardiologist: [facility] Primary Electrophysiologist:  Dr.Sidharth Manuella Ghazi  Chief Complaint:  Chest pain  Patient Profile:   Cody Saffold is a 68 y.o. male with a history of CAD s/p PCI to LAD & Stent, A.flutter s/p ablation in 2017, HLD, and HTN who presents in code STEMI.  History of Present Illness:   Mr. Plesha was examined and evaluated in the cath lab. He was in his usual state of health until yesterday night when he started to have nausea, diaphoresis, dyspnea at rest. He took 1 nitro w/o significant improvement. This morning he had 4/10 non-radiating chest tightness/pressure and came to [facility] ED for evaluation. He was found to have new ST elevation on inferior leads. Code STEMI was called and he was transferred to [facility] after being given aspirin and heparin bolus.  On chart review, he has had a  Left heart cath performed at [facility] in 2017 which showed patent stent in proximal LAD, 90% occlusion in small first diagonal artery & 25% occlusion of RCA w/ good EF at 60%. He also had A.flutter ablation performed at [facility] later that year as well. He had a recent stress test in 10/30/18 w/ findings of no significant perfusion defect described as low risk study.   Past Medical History:  Diagnosis Date  . Allergy   . Arthritis   . Depression   . Hyperlipidemia   . Hypertension     Past Surgical History:  Procedure Laterality Date  . CARDIAC ELECTROPHYSIOLOGY STUDY AND ABLATION       Medications Prior to Admission: Prior to Admission medications   Medication Sig Start Date End Date Taking? Authorizing Provider  ASPIRIN 81 PO Take 81 mg by mouth.    [provider]  atorvastatin (LIPITOR) 10 MG tablet Take 1 tablet (10 mg total) by mouth daily. 11/23/18   [name], Percell Miller, [name]   buPROPion (WELLBUTRIN XL) 300 MG 24 hr tablet TAKE 1 TABLET(300 MG) BY MOUTH DAILY 10/02/18   [name], Percell Miller, [name]  Cholecalciferol (VITAMIN D-1000 MAX ST) 25 MCG (1000 UT) tablet Take by mouth.    [provider]  diltiazem (CARDIZEM CD) 120 MG 24 hr capsule Take 1 capsule (120 mg total) by mouth daily. 10/11/18 01/09/19  Park Liter, MD  enalapril (VASOTEC) 5 MG tablet TAKE 1 TABLET(5 MG) BY MOUTH DAILY 11/23/18   [name], Percell Miller, [name]  fexofenadine (ALLEGRA) 180 MG tablet Take by mouth. 04/09/08   [provider]  finasteride (PROSCAR) 5 MG tablet 1 tab po q day 08/23/18   [name], Percell Miller, [name]  gabapentin (NEURONTIN) 100 MG capsule Take 200 mg by mouth.     [provider]  loratadine-pseudoephedrine (CLARITIN-D 24-HOUR) 10-240 MG 24 hr tablet Take 1 tablet by mouth as needed for allergies.    [provider]  nitroGLYCERIN (NITROSTAT) 0.4 MG SL tablet Place 1 tablet (0.4 mg total) under the tongue every 5 (five) minutes as needed for chest pain. 10/11/18   Park Liter, MD  Omega-3 Fatty Acids (FISH OIL) 1000 MG CAPS Take 1,000 mg by mouth.    [provider]  rivaroxaban (XARELTO) 20 MG TABS tablet Take 1 tablet (20 mg total) by mouth daily with supper. 11/23/18   [name], Percell Miller, [name]  sertraline (ZOLOFT) 100 MG tablet  06/13/18   [provider]  tamsulosin [facility])  0.4 MG CAPS capsule TAKE 1 CAPSULE(0.4 MG) BY MOUTH DAILY 11/23/18   [name], Percell Miller, [name]  Turmeric 500 MG CAPS Take 1 capsule by mouth daily.    [provider]  vitamin B-12 (CYANOCOBALAMIN) 1000 MCG tablet Take 1,000 mcg by mouth daily.    [provider]     Allergies:    Allergies  Allergen Reactions  . Naproxen Other (See Comments)    (Naprosyn *ANALGESICS - ANTI-INFLAMMATORY*) Nausea, Abdominal pain    Social History:   Social History   Socioeconomic History  . Marital status: Married    Spouse name: Not on file  . Number of children:  Not on file  . Years of education: Not on file  . Highest education level: Not on file  Occupational History  . Not on file  Social Needs  . Financial resource strain: Not on file  . Food insecurity    Worry: Not on file    Inability: Not on file  . Transportation needs    Medical: Not on file    Non-medical: Not on file  Tobacco Use  . Smoking status: Current Every Day Smoker    Packs/day: 1.00    Years: 30.00    Pack years: 30.00  . Smokeless tobacco: Never Used  Substance and Sexual Activity  . Alcohol use: Yes    Alcohol/week: 2.0 standard drinks    Types: 2 Cans of beer per week    Comment: 2 beers a day.  . Drug use: Never  . Sexual activity: Not on file  Lifestyle  . Physical activity    Days per week: Not on file    Minutes per session: Not on file  . Stress: Not on file  Relationships  . Social Herbalist on phone: Not on file    Gets together: Not on file    Attends religious service: Not on file    Active member of club or organization: Not on file    Attends meetings of clubs or organizations: Not on file    Relationship status: Not on file  . Intimate partner violence    Fear of current or ex partner: Not on file    Emotionally abused: Not on file    Physically abused: Not on file    Forced sexual activity: Not on file  Other Topics Concern  . Not on file  Social History Narrative  . Not on file    Family History:   The patient's family history is not on file.    ROS:  Please see the history of present illness.  All other ROS reviewed and negative.     Physical Exam/Data:   Vitals:   01/10/19 1318 01/10/19 1330 01/10/19 1340 01/10/19 1344  BP: 116/80 127/86 135/81   Pulse: (!) 115 (!) 111 (!) 114 (!) 113  Resp: 16 19 18 19   Temp: 98 F (36.7 C)     SpO2:  94% 95% 95%  Weight:      Height:       No intake or output data in the 24 hours ending 01/10/19 1400 Filed Weights   01/10/19 1315  Weight: 81.6 kg   Body mass index  is 25.1 kg/m.   Gen: Well-developed, well nourished, Wincing in pain HEENT: NCAT head, hearing intact Neck: supple, ROM intact, no JVD CV: RRR, S1, S2 normal, No rubs, no murmurs, no gallops Pulm: CTAB, No rales, no wheezes, no dullness to percussion  Abd: Soft,  BS+, NTND, No rebound, no guarding Extm: ROM intact, Peripheral pulses intact, No peripheral edema Skin: Dry, Warm, normal turgor, no rashes, lesions, wounds.   EKG:  The ECG that was done on 01/10/19 13:22 was personally reviewed and demonstrates normal sinus, ST elevation in inferior leads, slight ST elevation in anterior leads  Relevant CV Studies:  Myoview Stress Test 10/30/18 Nuclear Stress Findings  Isotope administration Rest isotope was administered  with an IV injection of 10.1 mCi Tc46m Tetrofosmin.  Rest SPECT images were obtained approximately 45 minutes post tracer injection.  Stress isotope was administered  with an IV injection of 32.5 mCi Tc68m Tetrofosmin   Stress SPECT images were obtained approximately 60 minutes post tracer injection.  Nuclear Measurements Study was gated.  Rest Perfusion Rest perfusion normal.  Stress Perfusion Stress perfusion normal.  Overall Study Impression Myocardial perfusion is normal.   The study is normal.   This is a low risk study.  Overall left ventricular systolic function was normal.    LV cavity size is normal.  Nuclear stress EF:  64%.   There is no prior study for comparison.     Nuclear stress EF: 64%.  There was no ST segment deviation noted during stress.  The study is normal.  This is a low risk study.   Low risk stress nuclear study with normal perfusion and normal left ventricular regional and global systolic function.  Laboratory Data:  ChemistryNo results for input(s): NA, K, CL, CO2, GLUCOSE, BUN, CREATININE, CALCIUM, GFRNONAA, GFRAA, ANIONGAP in the last 168 hours.  No results for input(s): PROT, ALBUMIN, AST, ALT, ALKPHOS, BILITOT in the last 168 hours.  Hematology Recent Labs  Lab 01/10/19 1328  WBC 10.4  RBC 5.03  HGB 17.0  HCT 49.0  MCV 97.4  MCH 33.8  MCHC 34.7  RDW 12.9  PLT 229   Cardiac EnzymesNo results for input(s): TROPONINI in the last 168 hours. No results for input(s): TROPIPOC in the last 168 hours.  BNPNo results for input(s): BNP, PROBNP in the last 168 hours.  DDimer No results for input(s): DDIMER in the last 168 hours.  Radiology/Studies:  Dg Chest Port 1 View  Result Date: 01/10/2019 CLINICAL DATA:  Left-sided chest pain, nausea and shortness of breath. EXAM: PORTABLE CHEST 1 VIEW COMPARISON:  11/23/2018 FINDINGS: Cardiomediastinal contours are stable compared to prior study. Linear scarring or atelectasis at the left lung base. No signs of dense consolidation or pleural effusion. No acute bone finding. IMPRESSION: No interval change in the appearance of the chest since 11/23/2018, no signs of acute abnormality. Electronically Signed   By: Zetta Bills M.D.   On: 01/10/2019 13:48    Assessment and Plan:   STEMI Presents w/ atypical chest pain at rest. On admission EKG w/ ST elevation on leads II, III, aVF suggesting RCA disease Initial Hs troponin 4000. Covid negative. Recent stress test on 10/2018 low risk. Prior cath in 2017 w/ evidence of stent in LAD. - Emergent cath per code STEMI - 100% occlusion proximal RCA s/p DES - Received heparin bolus in ED - Post PCI, plan DAPT (plavix and 81 mg ASA) x1 month then DC ASA and continue Plavix for 1 year - Telemetry - Nitro PRN for pain - Cycle troponin - Echocardiogram  Atrial Flutter s/p Ablation Hx of A.Flutter w/ RVR based on chart review s/p Ablation in 2017. Still on Xarelto possibly due to reoccurrence? Currently in sinus tachycardia. On Cardizem at home. - C/w telemetry - C/w  Xarelto -> restart 01/11/2019 - Start beta-blocker if bp/hr able to tolerate  HLD Lipid panel pending -Increase home dose of atorvastatin to 80mg   HTN Hold nephrotoxic  antihypertensives in setting of heavy contrast load  Severity of Illness: The appropriate patient status for this patient is INPATIENT. Inpatient status is judged to be reasonable and necessary in order to provide the required intensity of service to ensure the patient's safety. The patient's presenting symptoms, physical exam findings, and initial radiographic and laboratory data in the context of their chronic comorbidities is felt to place them at high risk for further clinical deterioration. Furthermore, it is not anticipated that the patient will be medically stable for discharge from the hospital within 2 midnights of admission. The following factors support the patient status of inpatient.   " The patient's presenting symptoms include chest pain. " The worrisome physical exam findings include tachypnea. " The initial radiographic and laboratory data are worrisome because of ST elevation on inferior leads in EKG. " The chronic co-morbidities include coronary artery disease requiring prior stent, history of atrial flutter, hypertension, hyperlipidemia.   * I certify that at the point of admission it is my clinical judgment that the patient will require inpatient hospital care spanning beyond 2 midnights from the point of admission due to high intensity of service, high risk for further deterioration and high frequency of surveillance required.*    For questions or updates, please contact [place] Please consult [email or web address] for contact info under Cardiology/STEMI.   Signed, Gilberto Better, MD PGY-2, [place] [facility] Pager: [phone] 01/10/2019 2:00 PM   Attending attestation to follow  ATTENDING ATTESTATION  I have seen, examined and evaluated the patient this PM along with Dr. Truman Hayward (R2).  After reviewing all the available data and chart, we discussed the patients laboratory, study & physical findings as well as symptoms in detail. I agree with his findings, examination as well as  impression recommendations as per our discussion.     Patient with known history of CAD, distant history of PCI to the LAD followed by Dr. Agustin Cree.  Has been noticing some heartburn type symptoms over the last couple days, but last night had significant chest discomfort" heartburn that almost better go to the ER.  However, it subsided.  Unfortunately the symptoms recurred again this morning and by midafternoon he presented to [place] where an EKG suggested inferior ST elevations as well as some lateral ST ovation (2 to 3 mm in II, III  and aVF with subtle 1 to 2 mm ST elevations in V3 through V6.   He was transferred to [facility] after receiving aspirin and 4000 units of IV heparin.  We saw her briefly in the ER in route to the Cath Lab.  Emergency cath was performed showing occluded RCA.  Difficult to cross lesion.  Treated with 2 overlapping DES stents (Resolute Onyx 2.5 mm x 34 mm -2.35mm 12 mm-postdilated to 2.9 mm)  The patient had significant pain not only in his chest but also heartburn and right shoulder during the procedure.  Transferred to CVICU in stable condition. Will obtain 2D echocardiogram tomorrow.   Glenetta Hew, M.D., M.S. Interventional Cardiologist   Pager # [phone] Phone # [phone] [address]. [place], [place] [address]

## 2019-01-10 NOTE — ED Notes (Addendum)
Wife at bedside.

## 2019-01-10 NOTE — Telephone Encounter (Signed)
Agree with advise to go to ED.

## 2019-01-10 NOTE — ED Notes (Signed)
Phoned wife and she is in route to ED

## 2019-01-10 NOTE — ED Triage Notes (Signed)
Pt c/o left sided chest pain starting last night , c/o nausea , SOB. Took nitro last PM w/o relief

## 2019-01-10 NOTE — Progress Notes (Signed)
Continue to encourage patient to not use right hand arm, caught fiddling with phone, trying to move to sit up etc. Reminded him of hematoma, and to not use, elevated on pillow. With 13 cc of air in TR band still. Area around soft, but bruised.

## 2019-01-10 NOTE — ED Notes (Signed)
MD at bedside calling STEMI alert

## 2019-01-10 NOTE — Progress Notes (Signed)
ANTICOAGULATION CONSULT NOTE - Initial Consult  Pharmacy Consult for  Heparin Indication: chest pain/ACS  Allergies  Allergen Reactions  . Naproxen Other (See Comments)    (Naprosyn *ANALGESICS - ANTI-INFLAMMATORY*) Nausea, Abdominal pain    Patient Measurements: Height: 5\' 11"  (180.3 cm) Weight: 180 lb (81.6 kg) IBW/kg (Calculated) : 75.3 Heparin Dosing Weight: 82  Vital Signs: Temp: 98 F (36.7 C) (10/21 1318) BP: 116/80 (10/21 1318) Pulse Rate: 115 (10/21 1318)  Labs: No results for input(s): HGB, HCT, PLT, APTT, LABPROT, INR, HEPARINUNFRC, HEPRLOWMOCWT, CREATININE, CKTOTAL, CKMB, TROPONINIHS in the last 72 hours.  CrCl cannot be calculated (Patient's most recent lab result is older than the maximum 21 days allowed.).   Medical History: Past Medical History:  Diagnosis Date  . Allergy   . Arthritis   . Depression   . Hyperlipidemia   . Hypertension     Medications:  Scheduled:  . aspirin      . fentaNYL (SUBLIMAZE) injection  50 mcg Intravenous Once  . heparin  4,000 Units Intravenous Once    Assessment:  Patient presents with CP since last night, with shoulder pain.  Transport is at [facility] for patient to cath lab.  Goal of Therapy:  Heparin level 0.3-0.7 units/ml Monitor platelets by anticoagulation protocol: Yes   Plan:  Give 4000 units bolus x 1 Start heparin infusion at 1000 units/hr Check anti-Xa level in 8 hours and daily while on heparin Continue to monitor H&H and platelets  Mallie Mussel A Erin Obando 01/10/2019,1:38 PM

## 2019-01-10 NOTE — ED Notes (Signed)
Transfer by [facility] to cath lab

## 2019-01-10 NOTE — Progress Notes (Signed)
ANTICOAGULATION CONSULT NOTE - Initial Consult  Pharmacy Consult for Tirofiban  Indication: chest pain/ACS  Allergies  Allergen Reactions  . Naproxen Other (See Comments)    (Naprosyn *ANALGESICS - ANTI-INFLAMMATORY*) Nausea, Abdominal pain    Patient Measurements: Height: 5\' 11"  (180.3 cm) Weight: 180 lb (81.6 kg) IBW/kg (Calculated) : 75.3  Vital Signs: Temp: 98 F (36.7 C) (10/21 1318) BP: 100/63 (10/21 1532) Pulse Rate: 128 (10/21 1532)  Labs: Recent Labs    01/10/19 1328  HGB 17.0  HCT 49.0  PLT 229  APTT 40*  LABPROT 15.8*  INR 1.3*  HEPARINUNFRC 1.90*  CREATININE 0.76  TROPONINIHS 4,060*    Estimated Creatinine Clearance: 94.1 mL/min (by C-G formula based on SCr of 0.76 mg/dL).   Medical History: Past Medical History:  Diagnosis Date  . Allergy   . Arthritis   . Depression   . Hyperlipidemia   . Hypertension    Assessment: 68 yo M s/p cath with tirofiban started in the cath lab at 15:03. Pharmacy consulted for tirofiban for 2 hours total. Confirmed that patient has enough in vial currently running to complete 2 hour run.   Goal of Therapy:  Monitor platelets by anticoagulation protocol: Yes   Plan:  Tirofiban order signed with end time  Pharmacy will sign off   Benetta Spar, PharmD, BCPS, [facility] Clinical Pharmacist

## 2019-01-10 NOTE — Telephone Encounter (Signed)
Pt. Reports he started having chest pain last night. Left sided with pain in both shoulders. Had shortness of breath and sweating. Had pain earlier today. Thought "maybe it's heart burn - I ate some boiled peanuts last night." Instructed pt. And wife to go to ED now for evaluation. Verbalizes understanding. Reason for Disposition . Pain also in shoulder(s) or arm(s) or jaw  Answer Assessment - Initial Assessment Questions 1. LOCATION: "Where does it hurt?"       Left side of chest 2. RADIATION: "Does the pain go anywhere else?" (e.g., into neck, jaw, arms, back)     Both shoulders 3. ONSET: "When did the chest pain begin?" (Minutes, hours or days)      Started last night 4. PATTERN "Does the pain come and go, or has it been constant since it started?"  "Does it get worse with exertion?"      Comes and goes 5. DURATION: "How long does it last" (e.g., seconds, minutes, hours)     Lasts 30 min. 6. SEVERITY: "How bad is the pain?"  (e.g., Scale 1-10; mild, moderate, or severe)    - MILD (1-3): doesn't interfere with normal activities     - MODERATE (4-7): interferes with normal activities or awakens from sleep    - SEVERE (8-10): excruciating pain, unable to do any normal activities       3-4 7. CARDIAC RISK FACTORS: "Do you have any history of heart problems or risk factors for heart disease?" (e.g., angina, prior heart attack; diabetes, high blood pressure, high cholesterol, smoker, or strong family history of heart disease)     CAD, Stents 8. PULMONARY RISK FACTORS: "Do you have any history of lung disease?"  (e.g., blood clots in lung, asthma, emphysema, birth control pills)     COPD 9. CAUSE: "What do you think is causing the chest pain?"     uNSURE 10. OTHER SYMPTOMS: "Do you have any other symptoms?" (e.g., dizziness, nausea, vomiting, sweating, fever, difficulty breathing, cough)       Shortness of breath last night, sweating last 11. PREGNANCY: "Is there any chance you are pregnant?"  "When was your last menstrual period?"       n/a  Protocols used: CHEST PAIN-A-AH

## 2019-01-10 NOTE — Progress Notes (Signed)
Assessment of patient at 1700 revealed a Level 2 hematoma adjacent to the TR band. Cath lab was called and they came to the unit and adjusted the band as well as held pressure on the hematoma until it disappeared. Area is bruised and reassessed as a Level 1.

## 2019-01-10 NOTE — ED Notes (Signed)
Pt reports chest pain described as pressure and burning beginning last night, took nitro sl x1 with some improvement,  Went to bed, awoke with  Continued chest pain described as indigestion feeling.  Mild nausea, denies vomiting.  Reports mild shortness of breath with exertion on arrival.  Pt states took all prescribed medications this morning including baby asa.  No visible diaphoreses on arrival.

## 2019-01-10 NOTE — ED Notes (Addendum)
Pt transferred to [facility] cath lab via [place].  Given heparin bolus only 4000 units, drip not initiated prior to departure.  Pt states pain level 3 at this time, declines fentanyl prior to leaving.  Signed consent for transfer completed.  Family member given instructions to [facility].  Rapid Covid test collected prior to departure. Report called to [place] RN at  Cath lab

## 2019-01-11 ENCOUNTER — Encounter (HOSPITAL_COMMUNITY): Payer: Self-pay | Admitting: Cardiology

## 2019-01-11 ENCOUNTER — Inpatient Hospital Stay (HOSPITAL_COMMUNITY): Payer: Medicare Other

## 2019-01-11 DIAGNOSIS — F17200 Nicotine dependence, unspecified, uncomplicated: Secondary | ICD-10-CM

## 2019-01-11 DIAGNOSIS — F1010 Alcohol abuse, uncomplicated: Secondary | ICD-10-CM

## 2019-01-11 DIAGNOSIS — I361 Nonrheumatic tricuspid (valve) insufficiency: Secondary | ICD-10-CM

## 2019-01-11 HISTORY — DX: Nicotine dependence, unspecified, uncomplicated: F17.200

## 2019-01-11 HISTORY — DX: Alcohol abuse, uncomplicated: F10.10

## 2019-01-11 LAB — ECHOCARDIOGRAM COMPLETE
Height: 71 in
Weight: 2920 oz

## 2019-01-11 LAB — TROPONIN I (HIGH SENSITIVITY): Troponin I (High Sensitivity): 8941 ng/L (ref <18)

## 2019-01-11 LAB — BASIC METABOLIC PANEL
Anion gap: 7 (ref 5–15)
BUN: 10 mg/dL (ref 8–23)
CO2: 24 mmol/L (ref 22–32)
Calcium: 8.4 mg/dL — ABNORMAL LOW (ref 8.9–10.3)
Chloride: 102 mmol/L (ref 98–111)
Creatinine, Ser: 0.9 mg/dL (ref 0.61–1.24)
GFR calc Af Amer: >60 mL/min (ref >60)
GFR calc non Af Amer: >60 mL/min (ref >60)
Glucose, Bld: 98 mg/dL (ref 70–99)
Potassium: 4.1 mmol/L (ref 3.5–5.1)
Sodium: 133 mmol/L — ABNORMAL LOW (ref 135–145)

## 2019-01-11 LAB — CBC
HCT: 43 % (ref 39.0–52.0)
Hemoglobin: 14.8 g/dL (ref 13.0–17.0)
MCH: 34.3 pg — ABNORMAL HIGH (ref 26.0–34.0)
MCHC: 34.4 g/dL (ref 30.0–36.0)
MCV: 99.8 fL (ref 80.0–100.0)
Platelets: 186 10*3/uL (ref 150–400)
RBC: 4.31 MIL/uL (ref 4.22–5.81)
RDW: 13.1 % (ref 11.5–15.5)
WBC: 9.1 10*3/uL (ref 4.0–10.5)
nRBC: 0 % (ref 0.0–0.2)

## 2019-01-11 LAB — HEMOGLOBIN A1C
Hgb A1c MFr Bld: 5.3 % (ref 4.8–5.6)
Mean Plasma Glucose: 105.41 mg/dL

## 2019-01-11 MED ORDER — ALUM & MAG HYDROXIDE-SIMETH 200-200-20 MG/5ML PO SUSP
30.0000 mL | Freq: Four times a day (QID) | ORAL | Status: DC | PRN
  Administered 2019-01-11: 30 mL via ORAL
  Filled 2019-01-11: qty 30

## 2019-01-11 MED ORDER — CARVEDILOL 3.125 MG PO TABS
3.1250 mg | ORAL_TABLET | Freq: Two times a day (BID) | ORAL | Status: DC
  Administered 2019-01-11 – 2019-01-12 (×2): 3.125 mg via ORAL
  Filled 2019-01-11 (×2): qty 1

## 2019-01-11 MED ORDER — SODIUM CHLORIDE 0.9 % IV BOLUS
250.0000 mL | Freq: Once | INTRAVENOUS | Status: AC
  Administered 2019-01-11: 250 mL via INTRAVENOUS

## 2019-01-11 MED ORDER — FAMOTIDINE IN NACL 20-0.9 MG/50ML-% IV SOLN
20.0000 mg | Freq: Once | INTRAVENOUS | Status: AC
  Administered 2019-01-11: 20 mg via INTRAVENOUS
  Filled 2019-01-11: qty 50

## 2019-01-11 MED ORDER — POLYETHYLENE GLYCOL 3350 17 G PO PACK
17.0000 g | PACK | Freq: Once | ORAL | Status: AC
  Administered 2019-01-11: 17 g via ORAL
  Filled 2019-01-11: qty 1

## 2019-01-11 MED FILL — Alum & Mag Hydroxide-Simethicone Susp 200-200-20 MG/5ML: ORAL | Qty: 30 | Status: AC

## 2019-01-11 NOTE — Progress Notes (Signed)
CARDIAC REHAB PHASE I   PRE:  Rate/Rhythm: 72 SR  BP:  Supine: 88/56,  84/60  Sitting: 79/48  Standing:    SaO2: 94%RA  MODE:  Ambulation: 0 ft   1250-1347 Came to walk with pt but BP too low and he c/o feeling a little dizzy in bed. Stated he had gotten pain medicine earlier. Notified pt's RN and held ambulation. Education completed except for ex ed. Discussed importance of plavix with stent, MI restrictions, NTG use, heart healthy food choices, smoking cessation and CRP 2. Gave smoking cessation handout and encouraged to call [place]. Pt stated he plans to quit cold [place]. He quit once for 10 years. Discussed CRP 2 and referred to [facility] . Will follow up tomorrow to walk and complete ed.  Pt voiced understanding of ed.       Graylon Good, RN BSN  01/11/2019 1:41 PM

## 2019-01-11 NOTE — Progress Notes (Signed)
  Echocardiogram 2D Echocardiogram has been performed.  Bobbye Charleston 01/11/2019, 3:49 PM

## 2019-01-11 NOTE — Progress Notes (Signed)
Report given to RN at this time on 3E

## 2019-01-11 NOTE — Progress Notes (Addendum)
The patient has been seen in conjunction with Nathanial Rancher, MD. All aspects of care have been considered and discussed. The patient has been personally interviewed, examined, and all clinical data has been reviewed.   Stable. No volume overload or arrhythmia.  Echo assessment of LV pending.  Stop diltiazem and add beta blocker for survival benefits.  Phase I and II.  Aggressive RFM  Transfer to cardiac tele 6 E.   Progress Note  Patient Name: Robert Lang Date of Encounter: 01/11/2019  Primary Cardiologist: No primary care provider on file.   Subjective   He is doing very well this morning and is without chest pain, epigastric burning sensation or shortness of breath.  Inpatient Medications    Scheduled Meds: . aspirin  81 mg Oral Daily  . atorvastatin  80 mg Oral q1800  . buPROPion  300 mg Oral Daily  . Chlorhexidine Gluconate Cloth  6 each Topical Daily  . clopidogrel  75 mg Oral Q breakfast  . diltiazem  120 mg Oral Daily  . enalapril  5 mg Oral Daily  . fentaNYL (SUBLIMAZE) injection  50 mcg Intravenous Once  . loratadine  10 mg Oral Daily  . rivaroxaban  20 mg Oral Daily  . sertraline  100 mg Oral QHS  . sodium chloride flush  3 mL Intravenous Q12H  . tamsulosin  0.4 mg Oral Daily  . vitamin B-12  1,000 mcg Oral Daily   Continuous Infusions: . sodium chloride 50 mL/hr (01/10/19 1431)  . sodium chloride    . heparin Stopped (01/10/19 1349)   PRN Meds: sodium chloride, acetaminophen, HYDROmorphone (DILAUDID) injection, nitroGLYCERIN, ondansetron (ZOFRAN) IV, sodium chloride flush   Vital Signs    Vitals:   01/11/19 0200 01/11/19 0300 01/11/19 0400 01/11/19 0500  BP: 113/76 113/78 110/76 112/72  Pulse: 94 91 95 95  Resp: 15 16 14 14   Temp:   98.1 F (36.7 C)   TempSrc:   Oral   SpO2: 96% 95% 97% 95%  Weight:      Height:        Intake/Output Summary (Last 24 hours) at 01/11/2019 0657 Last data filed at 01/11/2019 0553 Gross per 24 hour   Intake 1059.95 ml  Output 1500 ml  Net -440.05 ml   Filed Weights   01/10/19 1315  Weight: 81.6 kg    Telemetry    Sinus tachycardia, no evidence of V. tach or V. fib- Personally Reviewed  ECG    Sinus rhythm, first-degree block- Personally Reviewed  Physical Exam   GEN: No acute distress.   Cardiac: RRR, no murmurs, rubs, or gallops.  Respiratory: Clear to auscultation bilaterally. MS: No edema; No deformity. Neuro:  Nonfocal  Psych: Normal affect   Labs    Chemistry Recent Labs  Lab 01/10/19 1328 01/11/19 0223  NA 131* 133*  K 3.8 4.1  CL 96* 102  CO2 20* 24  GLUCOSE 92 98  BUN 10 10  CREATININE 0.76 0.90  CALCIUM 9.9 8.4*  GFRNONAA >60 >60  GFRAA >60 >60  ANIONGAP 15 7     Hematology Recent Labs  Lab 01/10/19 1328 01/11/19 0223  WBC 10.4 9.1  RBC 5.03 4.31  HGB 17.0 14.8  HCT 49.0 43.0  MCV 97.4 99.8  MCH 33.8 34.3*  MCHC 34.7 34.4  RDW 12.9 13.1  PLT 229 186    Cardiac EnzymesNo results for input(s): TROPONINI in the last 168 hours. No results for input(s): TROPIPOC in the last 168 hours.  BNPNo results for input(s): BNP, PROBNP in the last 168 hours.   DDimer No results for input(s): DDIMER in the last 168 hours.   Radiology    Dg Chest Port 1 View  Result Date: 01/10/2019 CLINICAL DATA:  Left-sided chest pain, nausea and shortness of breath. EXAM: PORTABLE CHEST 1 VIEW COMPARISON:  11/23/2018 FINDINGS: Cardiomediastinal contours are stable compared to prior study. Linear scarring or atelectasis at the left lung base. No signs of dense consolidation or pleural effusion. No acute bone finding. IMPRESSION: No interval change in the appearance of the chest since 11/23/2018, no signs of acute abnormality. Electronically Signed   By: Zetta Bills M.D.   On: 01/10/2019 13:48    Cardiac Studies   01/10/2019-left heart cath   CULPRIT lesion prox RCA lesion is 100% stenosed.  A drug-eluting stent (stent 2) was successfully placed to  overlap stent 1 and cover the culprit lesion, using a [place] [id number]. Postdilated 2.9 mm  Post intervention, there is a 0% residual stenosis.  Just distal to culprit lesion: Prox RCA to Mid RCA lesion is 70% stenosed. (Noted after initial angioplasty of the 100% stenosis))  Post intervention, there is a 0% residual stenosis.  A drug-eluting stent (stent 1) was successfully placed covering the distal portion of the lesion segment and landing just distal to the culprit lesion, using a STENT RESOLUTE ONYX 2.5X34. Postdilated to 2.6 mm distally and 2.9 mm proximally at the overlap  --------------------------------  Prox LAD to Mid LAD lesion is 50% stenosed -just prior to beginning of Previously placed Mid LAD to Dist LAD stent (unknown type) is widely patent.  Small caliber, jailed 2nd Diag vessel has ostial 90% stenosis as previously described.  There is no aortic valve stenosis.   SUMMARY  Significant two-vessel disease with culprit lesion being 100% occluded proximal-mid RCA with widely patent mid LAD stent preceded by a long segment of 40 to 50% stenosis.  Codominant system  Normal LVEDP--LV gram not done because of radial artery spasm   RECOMMENDATIONS  Admit for monitoring in the CVICU tonight anticipate transfer to the telemetry floor tomorrow with potential fast-track discharge by Friday 10/23  I have increased his atorvastatin to 80 mg daily  He is on diltiazem for rate control, if EF is still preserved, would be okay to continue with diltiazem otherwise would switch to beta-blocker.   Patient Profile   Robert Lang is a 68 y.o. male with a history of CAD s/p PCI to LAD, A.flutter s/p ablation in 2017, HLD, and HTN who presented with chest discomfort found to have ST elevation in inferior leads now status post LHC which reviewed complete stenosis of RCA that has been intervened with DES.  Assessment & Plan    #Acute inferior STEMI #CAD with prior  PCI to LAD He is now status post PCI with DES to RCA.  Doing well this morning and denies chest pain or shortness of breath.  He is stable to transfer to cardiac telemetry today and possibly discharge following echocardiogram results. -Continue aspirin, Plavix for 30 days and transition to only Plavix and Xarelto -Continue Coreg -Echocardiogram pending  #History of atrial flutter status post ablation He denies any recent longstanding episode of palpitation. -Currently Xarelto -Discontinue diltiazem  #Hyperlipidemia -Continue Lipitor  #Hypertension BP stable -Continue enalapril  For questions or updates, please contact [facility] Please consult [email or web address] for contact info under Cardiology/STEMI.      Signed, Jean Rosenthal, MD  01/11/2019, 6:57 AM

## 2019-01-11 NOTE — Progress Notes (Signed)
Patients wife called to check on patient and expressed concern about his smoking and drinking habits. Stated that he drinks anywhere from three to six beers a day and may not have told the physician.

## 2019-01-11 NOTE — Progress Notes (Signed)
Pt c/o nausea and heart burn. Zofran and maalox given to patient with no relief. Pt then complained of 5/10 chest pain, 7/10 with pressure applied. EKG performed. VS taken. PRN dilaudid given.  On call provider notified. Provider stated to go ahead and give SL nitro and they would order cardiac enzymes.  Provider to bedside to see the patient. Chest pain persistent and maintaining at a 3. Additional EKG performed at the providers request.  IV bolus ordered and administered. IV famotidine ordered and infusing. Awaiting lab results. Will continue to monitor and treat per providers orders.

## 2019-01-12 LAB — TROPONIN I (HIGH SENSITIVITY): Troponin I (High Sensitivity): 7173 ng/L (ref <18)

## 2019-01-12 MED ORDER — ATORVASTATIN CALCIUM 80 MG PO TABS: 80.0000 mg | ORAL_TABLET | Freq: Every day | ORAL | 11 refills | Status: DC

## 2019-01-12 MED ORDER — CLOPIDOGREL BISULFATE 75 MG PO TABS: 75.0000 mg | ORAL_TABLET | Freq: Every day | ORAL | 11 refills | Status: DC

## 2019-01-12 MED ORDER — CARVEDILOL 3.125 MG PO TABS: 3.1250 mg | ORAL_TABLET | Freq: Two times a day (BID) | ORAL | 3 refills | Status: DC

## 2019-01-12 MED ORDER — ISOSORBIDE MONONITRATE ER 30 MG PO TB24: 30.0000 mg | ORAL_TABLET | Freq: Every day | ORAL | Status: DC

## 2019-01-12 MED ORDER — COLCHICINE 0.6 MG PO TABS
0.6000 mg | ORAL_TABLET | Freq: Two times a day (BID) | ORAL | Status: DC
  Administered 2019-01-12: 0.6 mg via ORAL
  Filled 2019-01-12: qty 1

## 2019-01-12 MED ORDER — COLCHICINE 0.6 MG PO TABS: 0.6000 mg | ORAL_TABLET | Freq: Two times a day (BID) | ORAL | 3 refills | Status: DC

## 2019-01-12 MED FILL — CARVEDILOL 3.125 MG TABLET: 3.125 | 30 days supply | Qty: 60 | Fill #0

## 2019-01-12 MED FILL — COLCHICINE 0.6 MG TABS: 0.6 | 30 days supply | Qty: 60 | Fill #0

## 2019-01-12 MED FILL — CLOPIDOGREL 75 MG TABLET: 75 | 30 days supply | Qty: 30 | Fill #0

## 2019-01-12 MED FILL — ATORVASTATIN CALCIUM 80 MG: 80 | 30 days supply | Qty: 30 | Fill #0

## 2019-01-12 NOTE — Care Management Important Message (Signed)
Important Message  Patient Details  Name: Neyo Willmarth MRN: [id number] Date of Birth: May 11, 1950   Medicare Important Message Given:  Yes     Shelda Altes 01/12/2019, 11:22 AM

## 2019-01-12 NOTE — Discharge Instructions (Addendum)
Information about your medication: Plavix (anti-platelet agent)  Generic Name (Brand): clopidogrel (Plavix), once daily medication  PURPOSE: You are taking this medication along with aspirin to lower your chance of having a heart attack, stroke, or blood clots in your heart stent. These can be fatal. Brilinta and aspirin help prevent platelets from sticking together and forming a clot that can block an artery or your stent.   Common SIDE EFFECTS you may experience include: bruising or bleeding more easily, shortness of breath  Do not stop taking PLAVIX without talking to the doctor who prescribes it for you. People who are treated with a stent and stop taking Plavix too soon, have a higher risk of getting a blood clot in the stent, having a heart attack, or dying. If you stop Plavix because of bleeding, or for other reasons, your risk of a heart attack or stroke may increase.   Tell all of your doctors and dentists that you are taking Plavix. They should talk to the doctor who prescribed Brilinta for you before you have any surgery or invasive procedure.   Contact your health care provider if you experience: severe or uncontrollable bleeding, pink/red/brown urine, vomiting blood or vomit that looks like "coffee grounds", red or black stools (looks like tar), coughing up blood or blood clots ----------------------------------------------------------------------------------------------------------------------   ----------------------------------------------------------------------------------------------------------------------   De-escalation of Triple Therapy Post-PCI  Underwent cardiac catheterization with drug-eluting stent on 01/10/19. Plan for triple therapy with aspirin 81 mg and clopidogrel (Plavix) in addition to oral anticoagulation using rivaroxaban (Xarelto). Plan to discontinue aspirin on 02/07/19.

## 2019-01-12 NOTE — Progress Notes (Signed)
CARDIAC REHAB PHASE I   PRE:  Rate/Rhythm: 84 SR  BP:  Supine: 116/80  Sitting:   Standing:    SaO2: 96%RA  MODE:  Ambulation: 370 ft   POST:  Rate/Rhythm: 96 SR  BP:  Supine:   Sitting: 115/78  Standing:    SaO2: 98%RA 1052-1120 Pt walked 370 ft on RA with steady gait with a little dizziness that improved with walk. No CP. Brief review of ed done yesterday with wife. Gave ex ed to pt and wife who voiced understanding.    Graylon Good, RN BSN  01/12/2019 11:15 AM

## 2019-01-12 NOTE — Discharge Summary (Addendum)
The patient has been seen in conjunction with Vin Bhagat, PAC. All aspects of care have been considered and discussed. The patient has been personally interviewed, examined, and all clinical data has been reviewed.   Asymptomatic.  Recurrent chest pain earlier during hospital stay felt to represent inflammatory discomfort and therefore colchicine was started.  LVEF is normal.  Transition to beta-blocker therapy in lieu of diltiazem.  Aggressive secondary risk factor modification including LDL less than 70  Aspirin, clopidogrel and rivaroxaban for 4 weeks.  Stop aspirin thereafter but continue will be continue rivaroxaban and clopidogrel combination for at least 1 year.  Antiplatelet therapy beyond that point will be at the discretion of the primary cardiologist.  Transition of care OV in 7 days.  Discharge Summary    Patient ID: Robert Lang MRN: [id number]; DOB: Aug 15, 1950  Admit date: 01/10/2019 Discharge date: 01/12/2019  Primary Care Provider: Mackie Pai, [name]  Primary Cardiologist: Jenne Campus, MD   Discharge Diagnoses    Principal Problem:   Acute ST elevation myocardial infarction (STEMI) of inferolateral wall [facility]) Active Problems:   Coronary artery disease involving native coronary artery of native heart with unstable angina pectoris [facility])   Essential hypertension   Dyslipidemia, goal LDL below 70   Atrial flutter s/p ablation   Pericardial inflammation/post infarct pericarditis  Allergies Allergies  Allergen Reactions  . Naproxen Other (See Comments)    (Naprosyn *ANALGESICS - ANTI-INFLAMMATORY*) Nausea, Abdominal pain    Diagnostic Studies/Procedures    CORONARY/GRAFT ACUTE MI REVASCULARIZATION 01/10/2019  LEFT HEART CATH AND CORONARY ANGIOGRAPHY  Conclusion    CULPRIT lesion prox RCA lesion is 100% stenosed.  A drug-eluting stent (stent 2) was successfully placed to overlap stent 1 and cover the culprit lesion, using a [place] [id number]. Postdilated 2.9 mm  Post intervention, there is a 0% residual stenosis.  Just distal to culprit lesion: Prox RCA to Mid RCA lesion is 70% stenosed. (Noted after initial angioplasty of the 100% stenosis))  Post intervention, there is a 0% residual stenosis.  A drug-eluting stent (stent 1) was successfully placed covering the distal portion of the lesion segment and landing just distal to the culprit lesion, using a STENT RESOLUTE ONYX 2.5X34. Postdilated to 2.6 mm distally and 2.9 mm proximally at the overlap  --------------------------------  Prox LAD to Mid LAD lesion is 50% stenosed -just prior to beginning of Previously placed Mid LAD to Dist LAD stent (unknown type) is widely patent.  Small caliber, jailed 2nd Diag vessel has ostial 90% stenosis as previously described.  There is no aortic valve stenosis.  SUMMARY  Significant two-vessel disease with culprit lesion being 100% occluded proximal-mid RCA with widely patent mid LAD stent preceded by a long segment of 40 to 50% stenosis.  Codominant system  Normal LVEDP--LV gram not done because of radial artery spasm   RECOMMENDATIONS  Admit for monitoring in the CVICU tonight anticipate transfer to the telemetry floor tomorrow with potential fast-track discharge by Friday 10/23  I have increased his atorvastatin to 80 mg daily  He is on diltiazem for rate control, if EF is still preserved, would be okay to continue with diltiazem otherwise would switch to beta-blocker.   Echo 01/11/2019 1. Left ventricular ejection fraction, by visual estimation, is 55 to 60%. The left ventricle has normal function. There is moderately increased left ventricular hypertrophy. 2. Left ventricular diastolic function could not be evaluated pattern of LV diastolic filling. 3. Global right ventricle has mildly reduced systolic function.The  right ventricular size is mildly enlarged. No increase in right ventricular wall thickness.  4. Left atrial size was normal. 5. Right atrial size was normal. 6. The mitral valve is grossly normal. Trace mitral valve regurgitation. 7. The tricuspid valve is grossly normal. Tricuspid valve regurgitation is mild. 8. The aortic valve is tricuspid Aortic valve regurgitation was not visualized by color flow Doppler. Mild aortic valve sclerosis without stenosis. 9. The pulmonic valve was grossly normal. Pulmonic valve regurgitation is trivial by color flow Doppler. 10. Normal pulmonary artery systolic pressure. 11. The inferior vena cava is normal in size with greater than 50% respiratory variability, suggesting right atrial pressure of 3 mmHg.    History of Present Illness     68 y.o. male a 68 y.o.malewith a history of CAD s/p PCI to LAD, A.flutter s/p ablation in 2017, HLD, and HTNwho presented with chest discomfort found to have ST elevation in inferior leads.  He was in his usual state of health until 01/09/19 when he started to have nausea, diaphoresis, dyspnea at rest. He took 1 nitro w/o significant improvement. Next morning 10/21 he had 4/10 non-radiating chest tightness/pressure and came to [facility] ED for evaluation. He was found to have new ST elevation on inferior leads. Code STEMI was called and he was transferred to [facility] after being given aspirin and heparin bolus.  He has had a  Left heart cath performed at [facility] in 2017 which showed patent stent in proximal LAD, 90% occlusion in small first diagonal artery & 25% occlusion of RCA w/ good EF at 60%. He also had A.flutter ablation performed at [facility] later that year as well. He had a recent stress test in 10/30/18 w/ findings of no significant perfusion defect described as low risk study.   Hospital Course     Consultants: None  1. Acute inferior STEMI with hx of CAD s/p prior PCI to LAD - Cath as above s/p PCI with DES to RCA. Troponin peaked at [phone]. Echo showed preserved LV function. Continue triple therapy for 1  months and then stop ASA. Continue Plavix and Xarelto there after. Continue BB and high intensity statin.   2. Pericardial inflammation/post infarct pericarditis He had recurrent chest pain with ambulation with persisted. Seem post PCI inflammatory pain >> started Colchicine with resolved symptoms.  - Continue colchicine 0.6mg  BID to 2 weeks  2. Hx of atrial flutter s/p ablation - Maintaining sinus rhythm. Diltiazem switched to Coreg this admission. - On Xarelto for anticoagulation  3. HTN - BP stable on current medications  4. HLD - 01/10/2019: Cholesterol 122; HDL 36; LDL Cholesterol NOT CALCULATED; Triglycerides 215; VLDL 43  - Increased Lipitor to 80mg  qd - repeat labs in 6-8 weeks   Did the patient have an acute coronary syndrome (MI, NSTEMI, STEMI, etc) this admission?:  Yes                               AHA/ACC Clinical Performance & Quality Measures: 6. Aspirin prescribed? - Yes 7. ADP Receptor Inhibitor (Plavix/Clopidogrel, Brilinta/Ticagrelor or Effient/Prasugrel) prescribed (includes medically managed patients)? - Yes 8. Beta Blocker prescribed? - Yes 9. High Intensity Statin (Lipitor 40-80mg  or Crestor 20-40mg ) prescribed? - Yes 10. EF assessed during THIS hospitalization? - Yes 11. For EF <40%, was ACEI/ARB prescribed? - Yes 12. For EF <40%, Aldosterone Antagonist (Spironolactone or Eplerenone) prescribed? - Not Applicable (EF >/= [id number]) 13. Cardiac Rehab Phase II ordered (Included  Medically managed Patients)? - Yes   _____________  Discharge Vitals Blood pressure 115/78, pulse 84, temperature 98.2 F (36.8 C), temperature source Oral, resp. rate 20, height 5\' 11"  (1.803 m), weight 81.6 kg, SpO2 92 %.  Filed Weights   01/10/19 1315 01/11/19 1222 01/12/19 0710  Weight: 81.6 kg 82.8 kg 81.6 kg    Labs & Radiologic Studies    CBC Recent Labs    01/10/19 1328 01/11/19 0223  WBC 10.4 9.1  HGB 17.0 14.8  HCT 49.0 43.0  MCV 97.4 99.8  PLT 229 [id number]   Basic  Metabolic Panel Recent Labs    01/10/19 1328 01/11/19 0223  NA 131* 133*  K 3.8 4.1  CL 96* 102  CO2 20* 24  GLUCOSE 92 98  BUN 10 10  CREATININE 0.76 0.90  CALCIUM 9.9 8.4*   High Sensitivity Troponin:   Recent Labs  Lab 01/10/19 1328 01/10/19 1653 01/11/19 2240 01/12/19 0139  TROPONINIHS 4,060* 12,883* 8,941* 7,173*    Hemoglobin A1C Recent Labs    01/11/19 0710  HGBA1C 5.3   Fasting Lipid Panel Recent Labs    01/10/19 1328  CHOL 122  HDL 36*  LDLCALC NOT CALCULATED  TRIG 215*  CHOLHDL 3.4  _____________  Dg Chest Port 1 View  Result Date: 01/10/2019 CLINICAL DATA:  Left-sided chest pain, nausea and shortness of breath. EXAM: PORTABLE CHEST 1 VIEW COMPARISON:  11/23/2018 FINDINGS: Cardiomediastinal contours are stable compared to prior study. Linear scarring or atelectasis at the left lung base. No signs of dense consolidation or pleural effusion. No acute bone finding. IMPRESSION: No interval change in the appearance of the chest since 11/23/2018, no signs of acute abnormality. Electronically Signed   By: Zetta Bills M.D.   On: 01/10/2019 13:48   Disposition   Pt is being discharged home today in good condition.  Follow-up Plans & Appointments    Follow-up Information    [facility] Go on 01/22/2019.   Specialty: Cardiology Why: @10 :45 am for post hospital follow up with Laurann Montana, PA Contact information: [address], [place] [phone]         Discharge Instructions    Amb Referral to Cardiac Rehabilitation   Complete by: As directed    Referring to [place] [facility] 2   Diagnosis:  STEMI Coronary Stents     After initial evaluation and assessments completed: Virtual Based Care may be provided alone or in conjunction with Phase 2 Cardiac Rehab based on patient barriers.: Yes   Diet - low sodium heart healthy   Complete by: As directed    Discharge instructions   Complete  by: As directed    No driving for 2 weeks. No lifting over 10 lbs for 4 weeks. No sexual activity for 4 weeks. You may not return to work until cleared by your cardiologist.  Keep procedure site clean & dry. If you notice increased pain, swelling, bleeding or pus, call/return!  You may shower, but no soaking baths/hot tubs/pools for 1 week.   Increase activity slowly   Complete by: As directed       Discharge Medications   Allergies as of 01/12/2019      Reactions   Naproxen Other (See Comments)   (Naprosyn *ANALGESICS - ANTI-INFLAMMATORY*) Nausea, Abdominal pain      Medication List    STOP taking these medications   diltiazem 120 MG 24 hr capsule Commonly known as: CARDIZEM CD  TAKE these medications   ASPIRIN 81 PO Take 81 mg by mouth.   atorvastatin 80 MG tablet Commonly known as: LIPITOR Take 1 tablet (80 mg total) by mouth daily at 6 PM. What changed:   medication strength  how much to take  when to take this   buPROPion 300 MG 24 hr tablet Commonly known as: WELLBUTRIN XL TAKE 1 TABLET(300 MG) BY MOUTH DAILY What changed: See the new instructions.   carvedilol 3.125 MG tablet Commonly known as: COREG Take 1 tablet (3.125 mg total) by mouth 2 (two) times daily with a meal.   clopidogrel 75 MG tablet Commonly known as: PLAVIX Take 1 tablet (75 mg total) by mouth daily with breakfast. Start taking on: January 13, 2019   colchicine 0.6 MG tablet Take 1 tablet (0.6 mg total) by mouth 2 (two) times daily.   enalapril 5 MG tablet Commonly known as: VASOTEC TAKE 1 TABLET(5 MG) BY MOUTH DAILY What changed:   how much to take  how to take this  when to take this  additional instructions   fexofenadine 180 MG tablet Commonly known as: ALLEGRA Take 180 mg by mouth at bedtime.   finasteride 5 MG tablet Commonly known as: PROSCAR 1 tab po q day What changed:   how much to take  how to take this  when to take this  additional instructions    Fish Oil 1000 MG Caps Take 1,000 mg by mouth.   gabapentin 100 MG capsule Commonly known as: NEURONTIN Take 200 mg by mouth daily.   loratadine-pseudoephedrine 10-240 MG 24 hr tablet Commonly known as: CLARITIN-D 24-hour Take 1 tablet by mouth daily as needed for allergies.   nitroGLYCERIN 0.4 MG SL tablet Commonly known as: NITROSTAT Place 1 tablet (0.4 mg total) under the tongue every 5 (five) minutes as needed for chest pain.   rivaroxaban 20 MG Tabs tablet Commonly known as: XARELTO Take 1 tablet (20 mg total) by mouth daily with supper.   sertraline 100 MG tablet Commonly known as: ZOLOFT Take 150 mg by mouth at bedtime.   solifenacin 5 MG tablet Commonly known as: VESICARE Take 5 mg by mouth daily.   tamsulosin 0.4 MG Caps capsule Commonly known as: FLOMAX TAKE 1 CAPSULE(0.4 MG) BY MOUTH DAILY What changed:   how much to take  how to take this  when to take this  additional instructions   Turmeric 500 MG Caps Take 1 capsule by mouth 2 (two) times daily.   vitamin B-12 1000 MCG tablet Commonly known as: CYANOCOBALAMIN Take 1,000 mcg by mouth daily.   Vitamin D-1000 Max St 25 MCG (1000 UT) tablet Generic drug: Cholecalciferol Take 1,000 Units by mouth daily.       Outstanding Labs/Studies   LFTS and Lipid panel in 6 weeks   Duration of Discharge Encounter   Greater than 30 minutes including physician time.  Jarrett Soho, PA 01/12/2019, 2:53 PM

## 2019-01-12 NOTE — Progress Notes (Addendum)
The patient has been seen in conjunction with Vin Bhagat, PAC. All aspects of care have been considered and discussed. The patient has been personally interviewed, examined, and all clinical data has been reviewed.   Right arm ecchymosis.  Right radial pulses intact.  Chest is clear.  Cardiac exam 2/6 systolic murmur.  LV function by echo is preserved.  Some chest discomfort with ambulation yesterday which has persisted.  Does not sound ischemic, and suspect could be related to pericardial inflammation/post infarct pericarditis..  Digital cath images reviewed.  Start colchicine 0.6 mg twice daily for 2 weeks.  I do not believe he needs Imdur.  Ambulate with cardiac rehab and if stable discharge later today.   Progress Note  Patient Name: Robert Lang Date of Encounter: 01/12/2019  Primary Cardiologist: Jenne Campus, MD   Subjective   Mild chest pressure since ambulation yesterday. No shortness of breath.   Inpatient Medications    Scheduled Meds: . aspirin  81 mg Oral Daily  . atorvastatin  80 mg Oral q1800  . buPROPion  300 mg Oral Daily  . carvedilol  3.125 mg Oral BID WC  . Chlorhexidine Gluconate Cloth  6 each Topical Daily  . clopidogrel  75 mg Oral Q breakfast  . enalapril  5 mg Oral Daily  . fentaNYL (SUBLIMAZE) injection  50 mcg Intravenous Once  . isosorbide mononitrate  30 mg Oral Daily  . loratadine  10 mg Oral Daily  . rivaroxaban  20 mg Oral Daily  . sertraline  100 mg Oral QHS  . sodium chloride flush  3 mL Intravenous Q12H  . tamsulosin  0.4 mg Oral Daily  . vitamin B-12  1,000 mcg Oral Daily   Continuous Infusions: . sodium chloride 50 mL/hr (01/10/19 1431)  . sodium chloride     PRN Meds: sodium chloride, acetaminophen, alum & mag hydroxide-simeth, HYDROmorphone (DILAUDID) injection, nitroGLYCERIN, ondansetron (ZOFRAN) IV, sodium chloride flush   Vital Signs    Vitals:   01/11/19 2239 01/12/19 0042 01/12/19 0701 01/12/19 0710   BP: 108/65 120/79 138/84 132/76  Pulse: 92 88 84 89  Resp: 20 20 20 20   Temp:  98.2 F (36.8 C) 98.5 F (36.9 C) 98.3 F (36.8 C)  TempSrc:  Oral Oral Oral  SpO2:  95% 92%   Weight:    81.6 kg  Height:        Intake/Output Summary (Last 24 hours) at 01/12/2019 0945 Last data filed at 01/12/2019 0817 Gross per 24 hour  Intake 1579.38 ml  Output 1800 ml  Net -220.62 ml   Last 3 Weights 01/12/2019 01/11/2019 01/10/2019  Weight (lbs) 179 lb 12.8 oz 182 lb 8 oz 180 lb  Weight (kg) 81.557 kg 82.781 kg 81.647 kg      Telemetry    Sinus rhythm at rate of 80s- Personally Reviewed  ECG    None today   Physical Exam   GEN: No acute distress.   Neck: No JVD Cardiac: RRR, no murmurs, rubs, or gallops. Right arm ecchmyosis Respiratory: Clear to auscultation bilaterally. GI: Soft, nontender, non-distended  MS: No edema; No deformity. Neuro:  Nonfocal  Psych: Normal affect   Labs    High Sensitivity Troponin:   Recent Labs  Lab 01/10/19 1328 01/10/19 1653 01/11/19 2240 01/12/19 0139  TROPONINIHS 4,060* 12,883* 8,941* 7,173*      Chemistry Recent Labs  Lab 01/10/19 1328 01/11/19 0223  NA 131* 133*  K 3.8 4.1  CL 96* 102  CO2 20*  24  GLUCOSE 92 98  BUN 10 10  CREATININE 0.76 0.90  CALCIUM 9.9 8.4*  GFRNONAA >60 >60  GFRAA >60 >60  ANIONGAP 15 7     Hematology Recent Labs  Lab 01/10/19 1328 01/11/19 0223  WBC 10.4 9.1  RBC 5.03 4.31  HGB 17.0 14.8  HCT 49.0 43.0  MCV 97.4 99.8  MCH 33.8 34.3*  MCHC 34.7 34.4  RDW 12.9 13.1  PLT 229 186    Radiology    Dg Chest Port 1 View  Result Date: 01/10/2019 CLINICAL DATA:  Left-sided chest pain, nausea and shortness of breath. EXAM: PORTABLE CHEST 1 VIEW COMPARISON:  11/23/2018 FINDINGS: Cardiomediastinal contours are stable compared to prior study. Linear scarring or atelectasis at the left lung base. No signs of dense consolidation or pleural effusion. No acute bone finding. IMPRESSION: No interval  change in the appearance of the chest since 11/23/2018, no signs of acute abnormality. Electronically Signed   By: Zetta Bills M.D.   On: 01/10/2019 13:48    Cardiac Studies   CORONARY/GRAFT ACUTE MI REVASCULARIZATION 01/10/2019  LEFT HEART CATH AND CORONARY ANGIOGRAPHY  Conclusion    CULPRIT lesion prox RCA lesion is 100% stenosed.  A drug-eluting stent (stent 2) was successfully placed to overlap stent 1 and cover the culprit lesion, using a [place] [id number]. Postdilated 2.9 mm  Post intervention, there is a 0% residual stenosis.  Just distal to culprit lesion: Prox RCA to Mid RCA lesion is 70% stenosed. (Noted after initial angioplasty of the 100% stenosis))  Post intervention, there is a 0% residual stenosis.  A drug-eluting stent (stent 1) was successfully placed covering the distal portion of the lesion segment and landing just distal to the culprit lesion, using a STENT RESOLUTE ONYX 2.5X34. Postdilated to 2.6 mm distally and 2.9 mm proximally at the overlap  --------------------------------  Prox LAD to Mid LAD lesion is 50% stenosed -just prior to beginning of Previously placed Mid LAD to Dist LAD stent (unknown type) is widely patent.  Small caliber, jailed 2nd Diag vessel has ostial 90% stenosis as previously described.  There is no aortic valve stenosis.   SUMMARY  Significant two-vessel disease with culprit lesion being 100% occluded proximal-mid RCA with widely patent mid LAD stent preceded by a long segment of 40 to 50% stenosis.  Codominant system  Normal LVEDP--LV gram not done because of radial artery spasm   RECOMMENDATIONS  Admit for monitoring in the CVICU tonight anticipate transfer to the telemetry floor tomorrow with potential fast-track discharge by Friday 10/23  I have increased his atorvastatin to 80 mg daily  He is on diltiazem for rate control, if EF is still preserved, would be okay to continue with diltiazem otherwise would  switch to beta-blocker.   Echo 01/11/2019  1. Left ventricular ejection fraction, by visual estimation, is 55 to 60%. The left ventricle has normal function. There is moderately increased left ventricular hypertrophy.  2. Left ventricular diastolic function could not be evaluated pattern of LV diastolic filling.  3. Global right ventricle has mildly reduced systolic function.The right ventricular size is mildly enlarged. No increase in right ventricular wall thickness.  4. Left atrial size was normal.  5. Right atrial size was normal.  6. The mitral valve is grossly normal. Trace mitral valve regurgitation.  7. The tricuspid valve is grossly normal. Tricuspid valve regurgitation is mild.  8. The aortic valve is tricuspid Aortic valve regurgitation was not visualized by color flow Doppler. Mild  aortic valve sclerosis without stenosis.  9. The pulmonic valve was grossly normal. Pulmonic valve regurgitation is trivial by color flow Doppler. 10. Normal pulmonary artery systolic pressure. 11. The inferior vena cava is normal in size with greater than 50% respiratory variability, suggesting right atrial pressure of 3 mmHg.  Patient Profile     68 y.o. male a 68 y.o.malewith a history of CAD s/p PCI to LAD, A.flutter s/p ablation in 2017, HLD, and HTN who presented with chest discomfort found to have ST elevation in inferior leads now status post DES to RCA.   Assessment & Plan    1. Acute inferior STEMI with hx of CAD s/p prior PCI to LAD - Cath as above s/p PCI with DES to RCA. He had recurrent chest pain which started last night. Seem post PCI inflammatory pain >> start Colchicine.  - Continue triple therapy for 1 months and then stop ASA.  - Echo showed preserved LV function  - Continue BB  2. Hx of atrial flutter s/p ablation - Maintaining sinus rhythm. Diltiazem switched to Coreg this admission. - On Xarelto for anticoagulation  3. HTN - BP stable on current medications  4. HLD -  01/10/2019: Cholesterol 122; HDL 36; LDL Cholesterol NOT CALCULATED; Triglycerides 215; VLDL 43  - Increased Lipitor to 80mg  qd - repeat labs in 6-8 weeks   For questions or updates, please contact [place] Please consult [email or web address] for contact info under        SignedLeanor Kail, PA  01/12/2019, 9:45 AM

## 2019-01-12 NOTE — TOC Transition Note (Signed)
Transition of Care [facility]) - CM/SW Discharge Note   Patient Details  Name: Robert Lang MRN: [id number] Date of Birth: 12-Aug-1950  [facility] CM/SW Contact:  Zenon Mayo, RN Phone Number: 01/12/2019, 12:44 PM   Clinical Narrative:    Patient dc home, TOC filled meds cholchicine was 60.00 patient has insurance and he paid for his meds.   Final next level of care: Home/Self Care Barriers to Discharge: No Barriers Identified   Patient Goals and CMS Choice        Discharge Placement                       Discharge Plan and Services                                     Social Determinants of Health (SDOH) Interventions     Readmission Risk Interventions No flowsheet data found.

## 2019-01-15 ENCOUNTER — Telehealth: Payer: Self-pay

## 2019-01-15 NOTE — Telephone Encounter (Signed)
Copied from [place] [id number]. Topic: General - Other >> Jan 15, 2019 10:25 AM Carolyn Stare wrote: Pt has one refill on the below medication and the pharmacy said he cannot get it fill for 16 more days, pt has been taking 300 mg sometimes instead of the 200 when he has shooting pain , Pharmacy said pt will need a new RX  gabapentin (NEURONTIN) 100 MG capsule  [place]

## 2019-01-16 NOTE — Telephone Encounter (Signed)
This looks like historical medicine. Have I ever filled his gabapentin before?  Would you clarify what he is taking gabapentin for. I might can right new rx and send it to pharmacy. Want to clarify what pain treating.  Also gabapentin not controlled yet but I think fda/dea in process of changing classification to controlled med so pharmacy is cautious on prescribing/filling.

## 2019-01-17 MED ORDER — GABAPENTIN 100 MG PO CAPS: ORAL_CAPSULE | ORAL | 0 refills | Status: DC

## 2019-01-17 NOTE — Telephone Encounter (Signed)
I don't see where you have fill for him.Pt states he takes gabapentin for neuropathy he take 2 every night and if he is in pain he takes another one.

## 2019-01-17 NOTE — Telephone Encounter (Signed)
I sent in new rx. 2-3 tab po as needed pain. What time of the day does he take. Usually recommend at night due to sedation side effects. Use as directed. What is his exact area of pain. Want him to be scheduled before this current rx expires to discuss his pain.

## 2019-01-17 NOTE — Addendum Note (Signed)
Addended by: Anabel Halon on: 01/17/2019 03:44 PM   Modules accepted: Orders

## 2019-01-18 NOTE — Telephone Encounter (Signed)
I spoke with patient to acquire more information regarding pain/medication use. He is taking the medication at night. The pain is occurring in his left foot, has neuropathy. His appt has been scheduled for 11/9 to further discuss pain.

## 2019-01-18 NOTE — Telephone Encounter (Signed)
Ok thanks 

## 2019-01-21 NOTE — Progress Notes (Signed)
Office Visit    Patient Name: Taurino Figeroa Date of Encounter: 01/22/2019  Primary Care Provider:  Mackie Pai, [name] Primary Cardiologist:  Jenne Campus, MD Electrophysiologist:  None   Chief Complaint    Carlitos Mentel is a 68 y.o. male with a hx of CAD s/p PCI to LAD, atrial flutter ablation 2017, HLD, HTN presents today for post hospitalization and cardiac catheterization follow up.   Past Medical History    Past Medical History:  Diagnosis Date  . Allergy   . Arthritis   . Depression   . ETOH abuse 01/11/2019   6 pack of beer per day  . Hyperlipidemia   . Hypertension   . Smoker 01/11/2019   Past Surgical History:  Procedure Laterality Date  . BACK SURGERY    . CARDIAC ELECTROPHYSIOLOGY STUDY AND ABLATION    . CORONARY/GRAFT ACUTE MI REVASCULARIZATION N/A 01/10/2019   Procedure: CORONARY/GRAFT ACUTE MI REVASCULARIZATION;  Surgeon: Leonie Man, MD;  Location: [facility];  Service: Cardiovascular;  Laterality: N/A;  . LEFT HEART CATH AND CORONARY ANGIOGRAPHY N/A 01/10/2019   Procedure: LEFT HEART CATH AND CORONARY ANGIOGRAPHY;  Surgeon: Leonie Man, MD;  Location: [facility];  Service: Cardiovascular;  Laterality: N/A;    Allergies  Allergies  Allergen Reactions  . Naproxen Other (See Comments)    (Naprosyn *ANALGESICS - ANTI-INFLAMMATORY*) Nausea, Abdominal pain    History of Present Illness    Dameir Luchini is a 68 y.o. male with a hx of CAD s/p PCI to LAD/RCA, atrial flutter s/p ablation 2017, HLD, HTN last seen while hospitalized.  LHC at [facility] 2017 with patent stent in prox LAD, 90% occlusion in small first diagonal artery and 25% occlusion of RCA w/ good EF 60%. Atrial flutter ablation at [facility] in 2017. Stress test 10/30/18 with no perfusion defect described as low risk study.   Presented to [facility] ED 01/10/19 with chest pain, code STEMI called, transferred to [facility]. Troponin peaked at [phone]. Echo  preserved LV function. Underwent cardiac cath 01/10/19 with PCI and DES to RCA. Recommended for triple therapy for 1 month then stop aspirin. 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 His diltiazem was switched to coreg while hospitalized.  Reports feeling well since discharge.  He is very excited as he was informed just prior to this visit that his daughter is going into labor and he anticipates a new grandbaby very soon.  No recurrent chest pain since hospital discharge, no SLB, no DOE, no palpitations, no edema.  He is very appreciative of the care he received in the hospital and tells me that [facility] has the process together and it flowed very smoothly.  He does have significant bruising to the right radial cath site and tells me that it is very sore.  He has been trying to rest it.  Reports no other bleeding complications, no hematuria, no melena.  We discussed cardiac rehab and he tells me he has been very careful about his time outside of the home especially with a new grandbaby and we discussed considering participating in the virtual cardiac rehab program I have encouraged him to gradually resume his previous walking regimen.  Reports that he is some intermittent problems lightheadedness at home.  This is noted with position changes.  He has a low normal blood pressure on exam today.  Encouraged to check his blood  pressure regularly at home.  Long discussion regarding orthostatic hypotension and precautionary measures.  EKGs/Labs/Other Studies Reviewed:   The following studies were reviewed today:   CORONARY/GRAFT ACUTE MI REVASCULARIZATION 01/10/2019  LEFT HEART CATH AND CORONARY ANGIOGRAPHY  Conclusion      CULPRIT lesion prox RCA lesion is 100% stenosed.  A drug-eluting stent (stent 2) was successfully placed to overlap stent 1 and cover the culprit lesion, using a [place] [id number]. Postdilated 2.9 mm  Post intervention, there is a 0% residual stenosis.  Just distal to culprit lesion: Prox RCA to Mid RCA lesion is 70% stenosed. (Noted after initial angioplasty of the 100% stenosis))  Post intervention, there is a 0% residual stenosis.  A drug-eluting stent (stent 1) was successfully placed covering the distal portion of the lesion segment and landing just distal to the culprit lesion, using a STENT RESOLUTE ONYX 2.5X34. Postdilated to 2.6 mm distally and 2.9 mm proximally at the overlap  --------------------------------  Prox LAD to Mid LAD lesion is 50% stenosed -just prior to beginning of Previously placed Mid LAD to Dist LAD stent (unknown type) is widely patent.  Small caliber, jailed 2nd Diag vessel has ostial 90% stenosis as previously described.  There is no aortic valve stenosis.   SUMMARY  Significant two-vessel disease with culprit lesion being 100% occluded proximal-mid RCA with widely patent mid LAD stent preceded by a long segment of 40 to 50% stenosis.  Codominant system  Normal LVEDP--LV gram not done because of radial artery spasm     RECOMMENDATIONS  Admit for monitoring in the CVICU tonight anticipate transfer to the telemetry floor tomorrow with potential fast-track discharge by Friday 10/23  I have increased his atorvastatin to 80 mg daily  He is on diltiazem for rate control, if EF is still preserved, would be okay to continue with diltiazem otherwise would switch to beta-blocker.    Echo 01/11/2019  1. Left ventricular ejection fraction, by visual estimation, is 55 to 60%. The left ventricle has normal function. There is moderately increased left ventricular hypertrophy.  2. Left ventricular diastolic function could not be evaluated pattern of LV diastolic filling.  3. Global right ventricle has mildly reduced systolic function.The right ventricular size is mildly enlarged. No increase in right ventricular wall  thickness.  4. Left atrial size was normal.  5. Right atrial size was normal.  6. The mitral valve is grossly normal. Trace mitral valve regurgitation.  7. The tricuspid valve is grossly normal. Tricuspid valve regurgitation is mild.  8. The aortic valve is tricuspid Aortic valve regurgitation was not visualized by color flow Doppler. Mild aortic valve sclerosis without stenosis.  9. The pulmonic valve was grossly normal. Pulmonic valve regurgitation is trivial by color flow Doppler. 10. Normal pulmonary artery systolic pressure. 11. The inferior vena cava is normal in size with greater than 50% respiratory variability, suggesting right atrial pressure of 3 mmHg.  EKG:  EKG is ordered today.  The ekg ordered today demonstrates sinus rhythm with first-degree AV block rate 87 bpm with PR of 244.  Stable incomplete RBBB.  No acute ST/T wave changes.  Stable T wave inversion lead III, aVF, V1, V2.  Recent Labs: 09/06/2018: ALT 29 01/11/2019: BUN 10; Creatinine, Ser 0.90; Hemoglobin 14.8; Platelets 186; Potassium 4.1; Sodium 133  Recent Lipid Panel    Component Value Date/Time   CHOL 122 01/10/2019 1328   TRIG 215 (H) 01/10/2019 1328   HDL 36 (L) 01/10/2019 1328   CHOLHDL  3.4 01/10/2019 1328   VLDL 43 (H) 01/10/2019 1328   LDLCALC NOT CALCULATED 01/10/2019 1328   LDLDIRECT 66.0 09/06/2018 0924    Home Medications   Current Meds  Medication Sig  . ASPIRIN 81 PO Take 81 mg by mouth.  Marland Kitchen atorvastatin (LIPITOR) 80 MG tablet Take 1 tablet (80 mg total) by mouth daily at 6 PM.  . buPROPion (WELLBUTRIN XL) 300 MG 24 hr tablet TAKE 1 TABLET(300 MG) BY MOUTH DAILY (Patient taking differently: Take 300 mg by mouth daily. )  . carvedilol (COREG) 3.125 MG tablet Take 1 tablet (3.125 mg total) by mouth 2 (two) times daily with a meal.  . Cholecalciferol (VITAMIN D-1000 MAX ST) 25 MCG (1000 UT) tablet Take 1,000 Units by mouth daily.   . clopidogrel (PLAVIX) 75 MG tablet Take 1 tablet (75 mg total) by  mouth daily with breakfast.  . colchicine 0.6 MG tablet Take 1 tablet (0.6 mg total) by mouth 2 (two) times daily.  . enalapril (VASOTEC) 5 MG tablet TAKE 1 TABLET(5 MG) BY MOUTH DAILY (Patient taking differently: Take 5 mg by mouth daily. )  . fexofenadine (ALLEGRA) 180 MG tablet Take 180 mg by mouth at bedtime.   . finasteride (PROSCAR) 5 MG tablet 1 tab po q day (Patient taking differently: Take 5 mg by mouth at bedtime. )  . gabapentin (NEURONTIN) 100 MG capsule 2-3 tab po daily prn pain  . loratadine-pseudoephedrine (CLARITIN-D 24-HOUR) 10-240 MG 24 hr tablet Take 1 tablet by mouth daily as needed for allergies.   . nitroGLYCERIN (NITROSTAT) 0.4 MG SL tablet Place 1 tablet (0.4 mg total) under the tongue every 5 (five) minutes as needed for chest pain.  . Omega-3 Fatty Acids (FISH OIL) 1000 MG CAPS Take 1,000 mg by mouth.  . rivaroxaban (XARELTO) 20 MG TABS tablet Take 1 tablet (20 mg total) by mouth daily with supper.  . sertraline (ZOLOFT) 100 MG tablet Take 150 mg by mouth at bedtime.   . solifenacin (VESICARE) 5 MG tablet Take 5 mg by mouth daily.  . tamsulosin (FLOMAX) 0.4 MG CAPS capsule TAKE 1 CAPSULE(0.4 MG) BY MOUTH DAILY (Patient taking differently: Take 0.4 mg by mouth at bedtime. )  . Turmeric 500 MG CAPS Take 1 capsule by mouth 2 (two) times daily.   . vitamin B-12 (CYANOCOBALAMIN) 1000 MCG tablet Take 1,000 mcg by mouth daily.      Review of Systems       Review of Systems  Constitution: Negative for chills, fever and malaise/fatigue.  Cardiovascular: Negative for chest pain, dyspnea on exertion, irregular heartbeat, leg swelling, near-syncope and palpitations.  Respiratory: Negative for cough, shortness of breath and wheezing.   Skin:       Bruising  Gastrointestinal: Negative for nausea and vomiting.  Neurological: Positive for light-headedness. Negative for focal weakness and weakness.   All other systems reviewed and are otherwise negative except as noted above.   Physical Exam    VS:  BP (!) 98/56 (BP Location: Right Arm, Patient Position: Sitting, Cuff Size: Normal)  , BMI There is no height or weight on file to calculate BMI. GEN: Well nourished, well developed, in no acute distress. HEENT: normal. Neck: Supple, no JVD, carotid bruits, or masses. Cardiac: RRR, no murmurs, rubs, or gallops. No clubbing, cyanosis, edema.  Radials/DP/PT 2+ and equal bilaterally.  Respiratory:  Respirations regular and unlabored, clear to auscultation bilaterally. GI: Soft, nontender, nondistended, BS + x 4. MS: No deformity or atrophy. Skin: Warm  and dry, no rash.  Right radial cath site with significant black bruising up to the antecubital region.  Slightly tender to palpation.  Cath site is clean, dry, intact without bleeding or exudate.  He does have a small hematoma approx 0.5"x0.5" directly below his antecubital region. Neuro:  Strength and sensation are intact. Psych: Normal affect.  Assessment & Plan    1. S/p DES to LAD and 01/10/2019 DES to RCA-stable with no recurrent anginal symptoms.  Right radial cath site with significant rib bruising but no signs of active bleeding, educated that this will require likely 6 to 8 weeks to heal.  EKG today SR with no acute ST/T changes. Encourage participation in cardiac rehab.  Continue GDMT of low-dose beta-blocker and statin.  He may require transition from carvedilol 3.25 to metoprolol 12.5mg  at follow-up if episodes of lightheadedness recur.   He is presently on triple therapy of Plavix, Aspirin, Xarelto. He will stop his Aspirin 02/10/19 and continue Plavix and Xarelto per hospital discharge instructions. This was included on his AVS as well.   2. Pericardial inflammation/post infarct pericarditis - Started on Colchicine 0.6mg  BID x2 weeks while hospitalized for recurrent chest pain. NO recurrent chest pain. He will completed course.  3. Hx atrial flutter s/p ablation - EKG today SR. continue beta-blocker for rate  limiting medication.  Continue current anticoagulation.  4. HTN - Noted history. Presently low normal BP with some symptoms of orthostatic hypotension (lightheadedness with position changes). Recommended compression stockings. Recommend slow position changes.  He may require transition from carvedilol 3.25 to metoprolol 12.5mg  at follow-up if episodes of lightheadedness recur.   5. HLD - 01/10/19 cholesterol 122, HDL 36, LDL not calculated, VLDL 43, triglycerides 215. Atorvastatin increased while hospitalized. Lipid profile 6-8 weeks. He will stop by the office for these.  6. Chronic anticoagulation -  Secondary to hx of atrial flutter.   7. Tobacco use - Has not smoked since hospital discharge. Congratulated and encouraged continued smoking cessation. Recommend utilization of [id number].  Disposition: Follow up in 3 month(s) with Dr. Moshe Cipro, NP 01/22/2019, 6:21 PM

## 2019-01-22 ENCOUNTER — Encounter: Payer: Self-pay | Admitting: Family

## 2019-01-22 ENCOUNTER — Ambulatory Visit (INDEPENDENT_AMBULATORY_CARE_PROVIDER_SITE_OTHER): Payer: Medicare Other | Admitting: Family

## 2019-01-22 ENCOUNTER — Other Ambulatory Visit: Payer: Self-pay

## 2019-01-22 VITALS — BP 98/56

## 2019-01-22 DIAGNOSIS — I10 Essential (primary) hypertension: Secondary | ICD-10-CM | POA: Diagnosis not present

## 2019-01-22 DIAGNOSIS — I483 Typical atrial flutter: Secondary | ICD-10-CM | POA: Diagnosis not present

## 2019-01-22 DIAGNOSIS — I2510 Atherosclerotic heart disease of native coronary artery without angina pectoris: Secondary | ICD-10-CM | POA: Diagnosis not present

## 2019-01-22 DIAGNOSIS — Z7901 Long term (current) use of anticoagulants: Secondary | ICD-10-CM

## 2019-01-22 DIAGNOSIS — E785 Hyperlipidemia, unspecified: Secondary | ICD-10-CM | POA: Diagnosis not present

## 2019-01-22 DIAGNOSIS — Z720 Tobacco use: Secondary | ICD-10-CM

## 2019-01-22 DIAGNOSIS — I951 Orthostatic hypotension: Secondary | ICD-10-CM

## 2019-01-22 NOTE — Patient Instructions (Signed)
Medication Instructions:  No medication changes today.  On 02/10/2019 you will STOP your aspirin.  *If you need a refill on your cardiac medications before your next appointment, please call your pharmacy*  Lab Work: Your physician recommends that you return for lab work in 6-8 weeks: lipid profile, CMET  You do need to be fasting for this lab work. Please stop by the office from 8 am - 4:30 pm, but avoid 12p-1p. You do not need an appointment. This is to check your cholesterol after medication changes were made in the hospital. You will have this drawn Dec 14 - Dec 24.   If you have labs (blood work) drawn today and your tests are completely normal, you will receive your results only by: Marland Kitchen MyChart Message (if you have MyChart) OR . A paper copy in the mail If you have any lab test that is abnormal or we need to change your treatment, we will call you to review the results.  Testing/Procedures: You had an EKG today.  Follow-Up: At [facility], you and your health needs are our priority.  As part of our continuing mission to provide you with exceptional heart care, we have created designated Provider Care Teams.  These Care Teams include your primary Cardiologist (physician) and Advanced Practice Providers (APPs -  Physician Assistants and Nurse Practitioners) who all work together to provide you with the care you need, when you need it.  Your next appointment:   3 months  The format for your next appointment:   In Person  Provider:   Jenne Campus, MD  Other Instructions   Lightheadedness with position changes likely 'orthostatic hypotension'. Information included below. Compression stockings, remaining hydrated, regular meals, and slow position changes will help with this.   Continue to monitor bruising. Report blood in urine or blood in stool. Anticipate it will take 6-8 weeks for bruising to resolve completely.  You are currently on TRIPLE THERAPY to protect your stent  of aspirin, clopidogrel (Plavix) and rivaroxaban (Xarelto). You will STOP aspirin on 02/10/19.   Orthostatic Hypotension Blood pressure is a measurement of how strongly, or weakly, your blood is pressing against the walls of your arteries. Orthostatic hypotension is a sudden drop in blood pressure that happens when you quickly change positions, such as when you get up from sitting or lying down. Arteries are blood vessels that carry blood from your heart throughout your body. When blood pressure is too low, you may not get enough blood to your brain or to the rest of your organs. This can cause weakness, light-headedness, rapid heartbeat, and fainting. This can last for just a few seconds or for up to a few minutes. Orthostatic hypotension is usually not a serious problem. However, if it happens frequently or gets worse, it may be a sign of something more serious. What are the causes? This condition may be caused by:  Sudden changes in posture, such as standing up quickly after you have been sitting or lying down.  Blood loss.  Loss of body fluids (dehydration).  Heart problems.  Hormone (endocrine) problems.  Pregnancy.  Severe infection.  Lack of certain nutrients.  Severe allergic reactions (anaphylaxis).  Certain medicines, such as blood pressure medicine or medicines that make the body lose excess fluids (diuretics). Sometimes, this condition can be caused by not taking medicine as directed, such as taking too much of a certain medicine. What increases the risk? The following factors may make you more likely to develop this condition:  Age. Risk increases as you get older.  Conditions that affect the heart or the central nervous system.  Taking certain medicines, such as blood pressure medicine or diuretics.  Being pregnant. What are the signs or symptoms? Symptoms of this condition may include:  Weakness.  Light-headedness.  Dizziness.  Blurred vision.  Fatigue.   Rapid heartbeat.  Fainting, in severe cases. How is this diagnosed? This condition is diagnosed based on:  Your medical history.  Your symptoms.  Your blood pressure measurement. Your health care provider will check your blood pressure when you are: ? Lying down. ? Sitting. ? Standing. A blood pressure reading is recorded as two numbers, such as "120 over 80" (or 120/80). The first ("top") number is called the systolic pressure. It is a measure of the pressure in your arteries as your heart beats. The second ("bottom") number is called the diastolic pressure. It is a measure of the pressure in your arteries when your heart relaxes between beats. Blood pressure is measured in a unit called mm Hg. Healthy blood pressure for most adults is 120/80. If your blood pressure is below 90/60, you may be diagnosed with hypotension. Other information or tests that may be used to diagnose orthostatic hypotension include:  Your other vital signs, such as your heart rate and temperature.  Blood tests.  Tilt table test. For this test, you will be safely secured to a table that moves you from a lying position to an upright position. Your heart rhythm and blood pressure will be monitored during the test. How is this treated? This condition may be treated by:  Changing your diet. This may involve eating more salt (sodium) or drinking more water.  Taking medicines to raise your blood pressure.  Changing the dosage of certain medicines you are taking that might be lowering your blood pressure.  Wearing compression stockings. These stockings help to prevent blood clots and reduce swelling in your legs. In some cases, you may need to go to the hospital for:  Fluid replacement. This means you will receive fluids through an IV.  Blood replacement. This means you will receive donated blood through an IV (transfusion).  Treating an infection or heart problems, if this applies.  Monitoring. You may  need to be monitored while medicines that you are taking wear off. Follow these instructions at home: Eating and drinking   Drink enough fluid to keep your urine pale yellow.  Eat a healthy diet, and follow instructions from your health care provider about eating or drinking restrictions. A healthy diet includes: ? Fresh fruits and vegetables. ? Whole grains. ? Lean meats. ? Low-fat dairy products.  Eat extra salt only as directed. Do not add extra salt to your diet unless your health care provider told you to do that.  Eat frequent, small meals.  Avoid standing up suddenly after eating. Medicines  Take over-the-counter and prescription medicines only as told by your health care provider. ? Follow instructions from your health care provider about changing the dosage of your current medicines, if this applies. ? Do not stop or adjust any of your medicines on your own. General instructions   Wear compression stockings as told by your health care provider.  Get up slowly from lying down or sitting positions. This gives your blood pressure a chance to adjust.  Avoid hot showers and excessive heat as directed by your health care provider.  Return to your normal activities as told by your health care provider. Ask your  health care provider what activities are safe for you.  Do not use any products that contain nicotine or tobacco, such as cigarettes, e-cigarettes, and chewing tobacco. If you need help quitting, ask your health care provider.  Keep all follow-up visits as told by your health care provider. This is important. Contact a health care provider if you:  Vomit.  Have diarrhea.  Have a fever for more than 2-3 days.  Feel more thirsty than usual.  Feel weak and tired. Get help right away if you:  Have chest pain.  Have a fast or irregular heartbeat.  Develop numbness in any part of your body.  Cannot move your arms or your legs.  Have trouble speaking.   Become sweaty or feel light-headed.  Faint.  Feel short of breath.  Have trouble staying awake.  Feel confused. Summary  Orthostatic hypotension is a sudden drop in blood pressure that happens when you quickly change positions.  Orthostatic hypotension is usually not a serious problem.  It is diagnosed by having your blood pressure taken lying down, sitting, and then standing.  It may be treated by changing your diet or adjusting your medicines. This information is not intended to replace advice given to you by your health care provider. Make sure you discuss any questions you have with your health care provider. Document Released: 02/26/2002 Document Revised: 09/01/2017 Document Reviewed: 09/01/2017 Elsevier Patient Education  2020 [employer].

## 2019-01-26 ENCOUNTER — Other Ambulatory Visit: Payer: Self-pay

## 2019-01-29 ENCOUNTER — Ambulatory Visit (HOSPITAL_BASED_OUTPATIENT_CLINIC_OR_DEPARTMENT_OTHER)
Admission: RE | Admit: 2019-01-29 | Discharge: 2019-01-29 | Disposition: A | Discharge status: Home / Self Care | Payer: Medicare Other | Source: Ambulatory Visit | Attending: Medical | Admitting: Medical

## 2019-01-29 ENCOUNTER — Other Ambulatory Visit: Payer: Self-pay

## 2019-01-29 ENCOUNTER — Encounter: Payer: Self-pay | Admitting: Medical

## 2019-01-29 ENCOUNTER — Ambulatory Visit (INDEPENDENT_AMBULATORY_CARE_PROVIDER_SITE_OTHER): Payer: Medicare Other | Admitting: Medical

## 2019-01-29 VITALS — BP 99/63 | HR 68 | Temp 97.3°F | Resp 16 | Ht 71.0 in | Wt 178.4 lb

## 2019-01-29 DIAGNOSIS — M79601 Pain in right arm: Secondary | ICD-10-CM

## 2019-01-29 DIAGNOSIS — G629 Polyneuropathy, unspecified: Secondary | ICD-10-CM

## 2019-01-29 DIAGNOSIS — I2510 Atherosclerotic heart disease of native coronary artery without angina pectoris: Secondary | ICD-10-CM | POA: Diagnosis not present

## 2019-01-29 MED ORDER — DOXYCYCLINE HYCLATE 100 MG PO TABS: 100.0000 mg | ORAL_TABLET | Freq: Two times a day (BID) | ORAL | 0 refills | Status: DC

## 2019-01-29 NOTE — Progress Notes (Signed)
Subjective:    Patient ID: Robert Lang, male    DOB: Aug 11, 1950, 68 y.o.   MRN: [id number]  HPI  Pt in for follow up.  Pt in with some left foot pain. He was told in past that it was neuropathy. In past he was explained to use gabapentin. He states when used gabapentin in the past it does help.   Pt has no back pain.  Pt states rt arm pain in area where had catheterization.    Review of Systems  Constitutional: Negative for chills and fatigue.  HENT: Negative for congestion, ear discharge and ear pain.   Respiratory: Negative for cough, chest tightness, shortness of breath and wheezing.   Cardiovascular: Negative for chest pain and palpitations.  Gastrointestinal: Negative for abdominal pain, diarrhea, nausea and vomiting.  Musculoskeletal: Negative for back pain, myalgias and neck pain.  Skin: Negative for rash.  Neurological: Negative for dizziness, speech difficulty, weakness, numbness and headaches.  Hematological: Negative for adenopathy. Does not bruise/bleed easily.  Psychiatric/Behavioral: Negative for behavioral problems, dysphoric mood and sleep disturbance. The patient is not nervous/anxious and is not hyperactive.     Past Medical History:  Diagnosis Date  . Allergy   . Arthritis   . Depression   . ETOH abuse 01/11/2019   6 pack of beer per day  . Hyperlipidemia   . Hypertension   . Smoker 01/11/2019     Social History   Socioeconomic History  . Marital status: Married    Spouse name: Not on file  . Number of children: Not on file  . Years of education: Not on file  . Highest education level: Not on file  Occupational History  . Not on file  Social Needs  . Financial resource strain: Not on file  . Food insecurity    Worry: Not on file    Inability: Not on file  . Transportation needs    Medical: No    Non-medical: No  Tobacco Use  . Smoking status: Current Every Day Smoker    Packs/day: 1.00    Years: 30.00    Pack years: 30.00  .  Smokeless tobacco: Never Used  Substance and Sexual Activity  . Alcohol use: Yes    Alcohol/week: 6.0 standard drinks    Types: 6 Cans of beer per week    Comment: 6 pack a day  . Drug use: Never  . Sexual activity: Not on file  Lifestyle  . Physical activity    Days per week: Not on file    Minutes per session: Not on file  . Stress: Not on file  Relationships  . Social Herbalist on phone: Not on file    Gets together: Not on file    Attends religious service: Not on file    Active member of club or organization: Not on file    Attends meetings of clubs or organizations: Not on file    Relationship status: Not on file  . Intimate partner violence    Fear of current or ex partner: No    Emotionally abused: No    Physically abused: No    Forced sexual activity: No  Other Topics Concern  . Not on file  Social History Narrative  . Not on file    Past Surgical History:  Procedure Laterality Date  . BACK SURGERY    . CARDIAC ELECTROPHYSIOLOGY STUDY AND ABLATION    . CORONARY/GRAFT ACUTE MI REVASCULARIZATION N/A 01/10/2019  Procedure: CORONARY/GRAFT ACUTE MI REVASCULARIZATION;  Surgeon: Leonie Man, MD;  Location: [place] [facility];  Service: Cardiovascular;  Laterality: N/A;  . LEFT HEART CATH AND CORONARY ANGIOGRAPHY N/A 01/10/2019   Procedure: LEFT HEART CATH AND CORONARY ANGIOGRAPHY;  Surgeon: Leonie Man, MD;  Location: [place] [facility];  Service: Cardiovascular;  Laterality: N/A;    No family history on file.  Allergies  Allergen Reactions  . Naproxen Other (See Comments)    (Naprosyn *ANALGESICS - ANTI-INFLAMMATORY*) Nausea, Abdominal pain    Current Outpatient Medications on File Prior to Visit  Medication Sig Dispense Refill  . ASPIRIN 81 PO Take 81 mg by mouth.    Marland Kitchen atorvastatin (LIPITOR) 80 MG tablet Take 1 tablet (80 mg total) by mouth daily at 6 PM. 30 tablet 11  . buPROPion (WELLBUTRIN XL) 300 MG 24 hr tablet TAKE 1 TABLET(300 MG)  BY MOUTH DAILY (Patient taking differently: Take 300 mg by mouth daily. ) 30 tablet 0  . carvedilol (COREG) 3.125 MG tablet Take 1 tablet (3.125 mg total) by mouth 2 (two) times daily with a meal. 60 tablet 3  . Cholecalciferol (VITAMIN D-1000 MAX ST) 25 MCG (1000 UT) tablet Take 1,000 Units by mouth daily.     . clopidogrel (PLAVIX) 75 MG tablet Take 1 tablet (75 mg total) by mouth daily with breakfast. 30 tablet 11  . colchicine 0.6 MG tablet Take 1 tablet (0.6 mg total) by mouth 2 (two) times daily. 60 tablet 3  . enalapril (VASOTEC) 5 MG tablet TAKE 1 TABLET(5 MG) BY MOUTH DAILY (Patient taking differently: Take 5 mg by mouth daily. ) 90 tablet 0  . fexofenadine (ALLEGRA) 180 MG tablet Take 180 mg by mouth at bedtime.     . finasteride (PROSCAR) 5 MG tablet 1 tab po q day (Patient taking differently: Take 5 mg by mouth at bedtime. ) 90 tablet 1  . gabapentin (NEURONTIN) 100 MG capsule 2-3 tab po daily prn pain 90 capsule 0  . loratadine-pseudoephedrine (CLARITIN-D 24-HOUR) 10-240 MG 24 hr tablet Take 1 tablet by mouth daily as needed for allergies.     . nitroGLYCERIN (NITROSTAT) 0.4 MG SL tablet Place 1 tablet (0.4 mg total) under the tongue every 5 (five) minutes as needed for chest pain. 25 tablet 3  . Omega-3 Fatty Acids (FISH OIL) 1000 MG CAPS Take 1,000 mg by mouth.    . rivaroxaban (XARELTO) 20 MG TABS tablet Take 1 tablet (20 mg total) by mouth daily with supper. 90 tablet 0  . sertraline (ZOLOFT) 100 MG tablet Take 150 mg by mouth at bedtime.     . solifenacin (VESICARE) 5 MG tablet Take 5 mg by mouth daily.    . tamsulosin (FLOMAX) 0.4 MG CAPS capsule TAKE 1 CAPSULE(0.4 MG) BY MOUTH DAILY (Patient taking differently: Take 0.4 mg by mouth at bedtime. ) 90 capsule 0  . Turmeric 500 MG CAPS Take 1 capsule by mouth 2 (two) times daily.     . vitamin B-12 (CYANOCOBALAMIN) 1000 MCG tablet Take 1,000 mcg by mouth daily.     No current facility-administered medications on file prior to  visit.     BP 99/63   Pulse 68   Temp (!) 97.3 F (36.3 C) (Temporal)   Resp 16   Ht 5\' 11"  (1.803 m)   Wt 178 lb 6.4 oz (80.9 kg)   SpO2 98%   BMI 24.88 kg/m       Objective:   Physical  Exam  General Mental Status- Alert. General Appearance- Not in acute distress.   Skin General: Color- Normal Color. Moisture- Normal Moisture.  Neck Carotid Arteries- Normal color. Moisture- Normal Moisture. No carotid bruits. No JVD.  Chest and Lung Exam Auscultation: Breath Sounds:-Normal.  Cardiovascular Auscultation:Rythm- Regular. Murmurs & Other Heart Sounds:Auscultation of the heart reveals- No Murmurs.  Abdomen Inspection:-Inspeection Normal. Palpation/Percussion:Note:No mass. Palpation and Percussion of the abdomen reveal- Non Tender, Non Distended + BS, no rebound or guarding.   Neurologic Cranial Nerve exam:- CN III-XII intact(No nystagmus), symmetric smile. Strength:- 5/5 equal and symmetric strength both upper and lower extremities.  Rt arm- large bruise. He does have tenderness that feel like following path of vein and indurated.    Assessment & Plan:  [phone]. Pt cell [phone] Pt wife cell.   For your left lower ext neuropathic type pain continue with gabapentin. Discussed not over using. If need more tabs in future contact us and will change on next rx.  For rt upper ext pain, I have concern for skin infection vs phlebitis. Maybe both. Go ahead and rx doxycycline antibiotic. Rx advisement given. Advise warm compress to area twice daily. On review saw in chart on xarelto. So continue.   Follow up date 7-10 days or as needed. Exact date pending image review.  25 minutes spent with pt. 50% of time spent counseling pt on plan going forward.  Mackie Pai, [name]

## 2019-01-29 NOTE — Patient Instructions (Addendum)
For your left lower ext neuropathic type pain continue with gabapentin. Discussed not over using. If need more tabs in future contact us and will change on next rx.  For rt upper ext pain, I have concern for skin infection vs phlebitis. Maybe both. Go ahead and rx doxycycline antibiotic. Rx advisement given. Advise warm compress to area twice daily. On review saw in chart on xarelto. So continue.   Follow up date 7-10 days or as needed. Exact date pending image review.

## 2019-01-30 ENCOUNTER — Other Ambulatory Visit (HOSPITAL_BASED_OUTPATIENT_CLINIC_OR_DEPARTMENT_OTHER): Payer: Medicare Other

## 2019-02-14 ENCOUNTER — Telehealth (HOSPITAL_COMMUNITY): Payer: Self-pay | Admitting: Pharmacist

## 2019-02-14 NOTE — Telephone Encounter (Signed)
Patient underwent cardiac catheterization with placement of drug-eluting stent on 01/10/19 and was discharged on aspirin, Plavix, and Xarelto with plans to discontinue aspirin after one month. I called the patient to remind them to stop taking their aspirin. Patient confirmed he stopped taking aspirin after one month and continued his Plavix and Xarelto.

## 2019-02-20 ENCOUNTER — Other Ambulatory Visit: Payer: Self-pay

## 2019-02-20 MED ORDER — GABAPENTIN 100 MG PO CAPS: ORAL_CAPSULE | ORAL | 0 refills | Status: DC

## 2019-02-26 ENCOUNTER — Other Ambulatory Visit: Payer: Self-pay | Admitting: Cardiology

## 2019-02-28 ENCOUNTER — Other Ambulatory Visit: Payer: Self-pay | Admitting: *Deleted

## 2019-02-28 MED ORDER — BUPROPION HCL ER (XL) 300 MG PO TB24: ORAL_TABLET | ORAL | 1 refills | Status: DC

## 2019-02-28 MED ORDER — ENALAPRIL MALEATE 5 MG PO TABS: ORAL_TABLET | ORAL | 1 refills | Status: DC

## 2019-02-28 MED ORDER — FINASTERIDE 5 MG PO TABS: ORAL_TABLET | ORAL | 1 refills | Status: DC

## 2019-03-06 ENCOUNTER — Other Ambulatory Visit: Payer: Self-pay | Admitting: Medical

## 2019-03-19 ENCOUNTER — Encounter: Payer: Self-pay | Admitting: Cardiology

## 2019-03-19 ENCOUNTER — Ambulatory Visit (INDEPENDENT_AMBULATORY_CARE_PROVIDER_SITE_OTHER): Payer: Medicare Other | Admitting: Cardiology

## 2019-03-19 ENCOUNTER — Other Ambulatory Visit: Payer: Self-pay

## 2019-03-19 VITALS — BP 136/66 | HR 110 | Ht 71.0 in | Wt 181.8 lb

## 2019-03-19 DIAGNOSIS — I25110 Atherosclerotic heart disease of native coronary artery with unstable angina pectoris: Secondary | ICD-10-CM | POA: Diagnosis not present

## 2019-03-19 DIAGNOSIS — I10 Essential (primary) hypertension: Secondary | ICD-10-CM | POA: Diagnosis not present

## 2019-03-19 DIAGNOSIS — I483 Typical atrial flutter: Secondary | ICD-10-CM | POA: Diagnosis not present

## 2019-03-19 MED ORDER — FAMOTIDINE 40 MG PO TABS: 40.0000 mg | ORAL_TABLET | Freq: Every day | ORAL | 3 refills | Status: DC

## 2019-03-19 NOTE — Progress Notes (Signed)
Cardiology Office Note:    Date:  03/19/2019   ID:  Robert Lang, DOB 01-23-1951, MRN [id number]  PCP:  Mackie Pai, [name]  Cardiologist:  Jenne Campus, MD    Referring MD: Elise Benne   Chief Complaint  Patient presents with  . Follow-up    3 MO FU   I have a heartburn  History of Present Illness:    Robert Lang is a 68 y.o. male male with a hx of CAD s/p PCI to LAD/RCA, atrial flutter s/p ablation 2017, HLD, HTN last seen while hospitalized.  LHC at [facility] 2017 with patent stent in prox LAD, 90% occlusion in small first diagonal artery and 25% occlusion of RCA w/ good EF 60%. Atrial flutter ablation at [facility] in 2017. Stress test 10/30/18 with no perfusion defect described as low risk study.   Presented to [place] ED 01/10/19 with chest pain, code STEMI called, transferred to [facility]. Troponin peaked at [phone]. Echo preserved LV function. Underwent cardiac cath 01/10/19 with PCI and DES to RCA. Recommended for triple therapy for 1 month then stop aspirin. 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 His diltiazem was switched to coreg while hospitalized. He is complaining of having heartburn.  He described a situation when at evening time when he eats especially he lays down he will have a lot of burning and he is chest.  This is something completely different compared to the one before cardiac catheterization and angioplasty of the right coronary artery.  On top of that when he takes some Tums the pain gets better.  Since that time he decided to sleep with his head elevated.  He also described to have some difficulty swallowing.  He said he to eat much slower in small pieces.  He was diagnosed with pericarditis however after listening to his story I thinking more about GERD and then pericarditis.  Denies having any typical cardiac symptoms.  Is active and is  able to do what he wants to do.  Past Medical History:  Diagnosis Date  . Allergy   . Arthritis   . Depression   . ETOH abuse 01/11/2019   6 pack of beer per day  . Hyperlipidemia   . Hypertension   . Smoker 01/11/2019    Past Surgical History:  Procedure Laterality Date  . BACK SURGERY    . CARDIAC ELECTROPHYSIOLOGY STUDY AND ABLATION    . CORONARY/GRAFT ACUTE MI REVASCULARIZATION N/A 01/10/2019   Procedure: CORONARY/GRAFT ACUTE MI REVASCULARIZATION;  Surgeon: Leonie Man, MD;  Location: [place] [facility];  Service: Cardiovascular;  Laterality: N/A;  . LEFT HEART CATH AND CORONARY ANGIOGRAPHY N/A 01/10/2019   Procedure: LEFT HEART CATH AND CORONARY ANGIOGRAPHY;  Surgeon: Leonie Man, MD;  Location: [place] [facility];  Service: Cardiovascular;  Laterality: N/A;    Current Medications: Current Meds  Medication Sig  . ASPIRIN 81 PO Take 81 mg by mouth.  Marland Kitchen atorvastatin (LIPITOR) 80 MG tablet Take 1 tablet (80 mg total) by mouth daily at 6 PM.  . buPROPion (WELLBUTRIN XL) 300 MG 24 hr tablet TAKE 1 TABLET(300 MG) BY MOUTH DAILY  . carvedilol (COREG) 3.125 MG tablet Take 1 tablet (3.125 mg total) by mouth 2 (two) times daily with a meal.  . Cholecalciferol (VITAMIN D-1000 MAX ST) 25 MCG (1000 UT) tablet Take 1,000 Units by mouth daily.   . clopidogrel (PLAVIX) 75  MG tablet Take 1 tablet (75 mg total) by mouth daily with breakfast.  . colchicine 0.6 MG tablet Take 1 tablet (0.6 mg total) by mouth 2 (two) times daily.  Marland Kitchen doxycycline (VIBRA-TABS) 100 MG tablet Take 1 tablet (100 mg total) by mouth 2 (two) times daily.  . enalapril (VASOTEC) 5 MG tablet TAKE 1 TABLET(5 MG) BY MOUTH DAILY  . fexofenadine (ALLEGRA) 180 MG tablet Take 180 mg by mouth at bedtime.   . finasteride (PROSCAR) 5 MG tablet 1 tab po q day  . gabapentin (NEURONTIN) 100 MG capsule 2-3 tab po daily prn pain  . loratadine-pseudoephedrine (CLARITIN-D 24-HOUR) 10-240 MG 24 hr tablet Take 1 tablet by mouth  daily as needed for allergies.   . nitroGLYCERIN (NITROSTAT) 0.4 MG SL tablet Place 1 tablet (0.4 mg total) under the tongue every 5 (five) minutes as needed for chest pain.  . Omega-3 Fatty Acids (FISH OIL) 1000 MG CAPS Take 1,000 mg by mouth.  . sertraline (ZOLOFT) 100 MG tablet Take 150 mg by mouth at bedtime.   . solifenacin (VESICARE) 5 MG tablet Take 5 mg by mouth daily.  . tamsulosin (FLOMAX) 0.4 MG CAPS capsule TAKE 1 CAPSULE(0.4 MG) BY MOUTH DAILY  . Turmeric 500 MG CAPS Take 1 capsule by mouth 2 (two) times daily.   . vitamin B-12 (CYANOCOBALAMIN) 1000 MCG tablet Take 1,000 mcg by mouth daily.  Alveda Reasons 20 MG TABS tablet TAKE 1 TABLET(20 MG) BY MOUTH DAILY WITH SUPPER     Allergies:   Naproxen   Social History   Socioeconomic History  . Marital status: Married    Spouse name: Not on file  . Number of children: Not on file  . Years of education: Not on file  . Highest education level: Not on file  Occupational History  . Not on file  Tobacco Use  . Smoking status: Current Every Day Smoker    Packs/day: 1.00    Years: 30.00    Pack years: 30.00  . Smokeless tobacco: Never Used  Substance and Sexual Activity  . Alcohol use: Yes    Alcohol/week: 6.0 standard drinks    Types: 6 Cans of beer per week    Comment: 6 pack a day  . Drug use: Never  . Sexual activity: Not on file  Other Topics Concern  . Not on file  Social History Narrative  . Not on file   Social Determinants of Health   Financial Resource Strain:   . Difficulty of Paying Living Expenses: Not on file  Food Insecurity:   . Worried About Charity fundraiser in the Last Year: Not on file  . Ran Out of Food in the Last Year: Not on file  Transportation Needs: No Transportation Needs  . Lack of Transportation (Medical): No  . Lack of Transportation (Non-Medical): No  Physical Activity:   . Days of Exercise per Week: Not on file  . Minutes of Exercise per Session: Not on file  Stress:   . Feeling of  Stress : Not on file  Social Connections:   . Frequency of Communication with Friends and Family: Not on file  . Frequency of Social Gatherings with Friends and Family: Not on file  . Attends Religious Services: Not on file  . Active Member of Clubs or Organizations: Not on file  . Attends Archivist Meetings: Not on file  . Marital Status: Not on file     Family History: The patient's family  history is not on file. ROS:   Please see the history of present illness.    All 14 point review of systems negative except as described per history of present illness  EKGs/Labs/Other Studies Reviewed:      Recent Labs: 09/06/2018: ALT 29 01/11/2019: BUN 10; Creatinine, Ser 0.90; Hemoglobin 14.8; Platelets 186; Potassium 4.1; Sodium 133  Recent Lipid Panel    Component Value Date/Time   CHOL 122 01/10/2019 1328   TRIG 215 (H) 01/10/2019 1328   HDL 36 (L) 01/10/2019 1328   CHOLHDL 3.4 01/10/2019 1328   VLDL 43 (H) 01/10/2019 1328   LDLCALC NOT CALCULATED 01/10/2019 1328   LDLDIRECT 66.0 09/06/2018 0924    Physical Exam:    VS:  Ht 5\' 11"  (1.803 m)   Wt 181 lb 12.8 oz (82.5 kg)   BMI 25.36 kg/m     Wt Readings from Last 3 Encounters:  03/19/19 181 lb 12.8 oz (82.5 kg)  01/29/19 178 lb 6.4 oz (80.9 kg)  01/12/19 179 lb 12.8 oz (81.6 kg)     GEN:  Well nourished, well developed in no acute distress HEENT: Normal NECK: No JVD; No carotid bruits LYMPHATICS: No lymphadenopathy CARDIAC: RRR, no murmurs, no rubs, no gallops RESPIRATORY:  Clear to auscultation without rales, wheezing or rhonchi  ABDOMEN: Soft, non-tender, non-distended MUSCULOSKELETAL:  No edema; No deformity  SKIN: Warm and dry LOWER EXTREMITIES: no swelling NEUROLOGIC:  Alert and oriented x 3 PSYCHIATRIC:  Normal affect   ASSESSMENT:    1. Coronary artery disease involving native coronary artery of native heart with unstable angina pectoris ([place])   2. Essential hypertension   3. Typical atrial  flutter (HCC)    PLAN:    In order of problems listed above:  1. Coronary disease status post PTCA and stenting of the right coronary artery about 1-1/43-month ago.  Doing well therapy right now with Xarelto for CVA protection with atrial flutter, as well as Plavix after stenting.  He need to be taking distal medication for at least 6 preferably 12 months 2. essential hypertension blood pressure appears to be well controlled continue present management. 2. Typical atrial flutter history of status post ablation still anticoagulated which I will continue. 3. Questionable pericarditis I will check a sed rate today sed rate is normal doubt very much in this diagnosis.  Asking to stop colchicine.   Medication Adjustments/Labs and Tests Ordered: Current medicines are reviewed at length with the patient today.  Concerns regarding medicines are outlined above.  No orders of the defined types were placed in this encounter.  Medication changes: No orders of the defined types were placed in this encounter.   Signed, Park Liter, MD, [facility] 03/19/2019 1:24 PM    [place]

## 2019-03-19 NOTE — Patient Instructions (Signed)
Medication Instructions:  STOP Colchicine START Pepcid 40 mg by mouth daily.  START taking Malox as needed.  *If you need a refill on your cardiac medications before your next appointment, please call your pharmacy*  Lab Work: SED RATE today  If you have labs (blood work) drawn today and your tests are completely normal, you will receive your results only by: Marland Kitchen MyChart Message (if you have MyChart) OR . A paper copy in the mail If you have any lab test that is abnormal or we need to change your treatment, we will call you to review the results.  Testing/Procedures: None ordered  Follow-Up: At [facility], you and your health needs are our priority.  As part of our continuing mission to provide you with exceptional heart care, we have created designated Provider Care Teams.  These Care Teams include your primary Cardiologist (physician) and Advanced Practice Providers (APPs -  Physician Assistants and Nurse Practitioners) who all work together to provide you with the care you need, when you need it.  Your next appointment:   1 month(s)  The format for your next appointment:   In Person  Provider:   Jenne Campus, MD  Other Instructions You have been referred to Gastroenterology.

## 2019-03-20 ENCOUNTER — Encounter: Payer: Self-pay | Admitting: Gastroenterology

## 2019-03-20 LAB — SEDIMENTATION RATE: Sed Rate: 19 mm/hr (ref 0–30)

## 2019-03-21 ENCOUNTER — Telehealth: Payer: Self-pay | Admitting: Medical

## 2019-03-21 NOTE — Telephone Encounter (Signed)
Opened to review 

## 2019-03-29 ENCOUNTER — Ambulatory Visit (INDEPENDENT_AMBULATORY_CARE_PROVIDER_SITE_OTHER): Payer: Medicare Other | Admitting: Gastroenterology

## 2019-03-29 ENCOUNTER — Encounter: Payer: Self-pay | Admitting: Gastroenterology

## 2019-03-29 ENCOUNTER — Other Ambulatory Visit: Payer: Self-pay

## 2019-03-29 VITALS — BP 116/72 | HR 82 | Temp 97.7°F | Ht 71.0 in | Wt 186.5 lb

## 2019-03-29 DIAGNOSIS — K219 Gastro-esophageal reflux disease without esophagitis: Secondary | ICD-10-CM | POA: Diagnosis not present

## 2019-03-29 DIAGNOSIS — R1310 Dysphagia, unspecified: Secondary | ICD-10-CM | POA: Diagnosis not present

## 2019-03-29 MED ORDER — PANTOPRAZOLE SODIUM 40 MG PO TBEC: 40.0000 mg | DELAYED_RELEASE_TABLET | Freq: Every day | ORAL | 6 refills | Status: DC

## 2019-03-29 NOTE — Progress Notes (Signed)
Chief Complaint:   Referring Provider:  Park Liter, MD      ASSESSMENT AND PLAN;   #1. GERD with eso dysphagia. D/d includes eso stricture, Schatzki's ring, motility disorder, eosinophilic esophagitis, pill induced esophagitis, r/o esophageal Ca or extrinsic lesions.  #2. Cough with hoarseness, likely due to reflux. R/O other causes.  Does take ACE inhibitors. Plan: - Protonix 40mg  po qAM #30, 6 refills - Continue pepcid 40mg  po QHS, 30, 6 refills - Ba swallow with barium tablet. - Hold of on EGD for now until results of barium swallow since he cannot be taken off Xarelto/Plavix as per cardiology notes unless absolutely necessary. - FU in 12 weeks. - Discussed in detail with the patient and patient's wife. - If persistent cough with hoarseness, would recommend ENT consultation.   HPI:    Robert Lang is a 69 y.o. male  hx of CAD s/p recent MI 01/10/2019 s/p PCI/DES to RCA on Xeralto/plavix, H/O atrial flutter s/p ablation 2017, HLD.  Had pericarditis treated with colchicine briefly.  Referred by Dr. Raliegh Ip  Has been having increasing reflux including occasional heartburn and dysphagia since 01/10/2019 (DES).  Dysphagia to both solids and liquids, upper chest, no regurgitation, no odynophagia.  Has been having increasing cough and hoarseness especially after eating mostly at night.  Started on Pepcid which did help a little.  No weight loss.  No melena or hematochezia.  Denies having any diarrhea or constipation.  Past GI procedures: -[facility] 09/[facility].  Fair prep.  Sigmoid diverticulosis.  Repeat in 5 years (2023). FH colon cancer (dad >60)  SH: Moved here recently, taking care of grandkids.  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 Past Medical History:  Diagnosis Date  . Allergy   . Anxiety   . Arthritis   . Atrial fibrillation ([place])   . CAD (coronary artery disease)   . COPD (chronic obstructive pulmonary disease) ([place])   . Depression     . ETOH abuse 01/11/2019   6 pack of beer per day  . GERD (gastroesophageal reflux disease)   . History of colon polyps   . Hyperlipidemia   . Hypertension   . Kidney stones   . Smoker 01/11/2019    Past Surgical History:  Procedure Laterality Date  . BACK SURGERY    . CARDIAC ELECTROPHYSIOLOGY STUDY AND ABLATION    . CATARACT EXTRACTION    . CORONARY/GRAFT ACUTE MI REVASCULARIZATION N/A 01/10/2019   Procedure: CORONARY/GRAFT ACUTE MI REVASCULARIZATION;  Surgeon: Leonie Man, MD;  Location: [place] [facility];  Service: Cardiovascular;  Laterality: N/A;  . heart flutter ablation    . heart stent    . HERNIA REPAIR    . lamenectomy    . LEFT HEART CATH AND CORONARY ANGIOGRAPHY N/A 01/10/2019   Procedure: LEFT HEART CATH AND CORONARY ANGIOGRAPHY;  Surgeon: Leonie Man, MD;  Location: [place] [facility];  Service: Cardiovascular;  Laterality: N/A;    Family History  Problem Relation Age of Onset  . Colon cancer Father   . Throat cancer Brother     Social History   Tobacco Use  . Smoking status: Former Smoker    Packs/day: 1.00    Years: 30.00    Pack years: 30.00  . Smokeless tobacco: Former Systems developer  . Tobacco comment: 01/10/2019  Substance Use Topics  . Alcohol use: Yes    Alcohol/week: 6.0 standard drinks    Types: 6 Cans of beer per week  Comment: 6 pack a day  . Drug use: Never    Current Outpatient Medications  Medication Sig Dispense Refill  . atorvastatin (LIPITOR) 80 MG tablet Take 1 tablet (80 mg total) by mouth daily at 6 PM. 30 tablet 11  . buPROPion (WELLBUTRIN XL) 300 MG 24 hr tablet TAKE 1 TABLET(300 MG) BY MOUTH DAILY 90 tablet 1  . carvedilol (COREG) 3.125 MG tablet Take 1 tablet (3.125 mg total) by mouth 2 (two) times daily with a meal. 60 tablet 3  . Cholecalciferol (VITAMIN D-1000 MAX ST) 25 MCG (1000 UT) tablet Take 1,000 Units by mouth daily.     . clopidogrel (PLAVIX) 75 MG tablet Take 1 tablet (75 mg total) by mouth daily with  breakfast. 30 tablet 11  . enalapril (VASOTEC) 5 MG tablet TAKE 1 TABLET(5 MG) BY MOUTH DAILY 90 tablet 1  . famotidine (PEPCID) 40 MG tablet Take 1 tablet (40 mg total) by mouth daily. 90 tablet 3  . fexofenadine (ALLEGRA) 180 MG tablet Take 180 mg by mouth at bedtime.     . finasteride (PROSCAR) 5 MG tablet 1 tab po q day 90 tablet 1  . gabapentin (NEURONTIN) 100 MG capsule 2-3 tab po daily prn pain 90 capsule 0  . loratadine-pseudoephedrine (CLARITIN-D 24-HOUR) 10-240 MG 24 hr tablet Take 1 tablet by mouth daily as needed for allergies.     . nitroGLYCERIN (NITROSTAT) 0.4 MG SL tablet Place 1 tablet (0.4 mg total) under the tongue every 5 (five) minutes as needed for chest pain. 25 tablet 3  . Omega-3 Fatty Acids (FISH OIL) 1000 MG CAPS Take 1,000 mg by mouth.    . sertraline (ZOLOFT) 100 MG tablet Take 150 mg by mouth at bedtime.     . solifenacin (VESICARE) 5 MG tablet Take 5 mg by mouth daily.    . tamsulosin (FLOMAX) 0.4 MG CAPS capsule TAKE 1 CAPSULE(0.4 MG) BY MOUTH DAILY 90 capsule 0  . Turmeric 500 MG CAPS Take 1 capsule by mouth 2 (two) times daily.     . vitamin B-12 (CYANOCOBALAMIN) 1000 MCG tablet Take 1,000 mcg by mouth daily.    Alveda Reasons 20 MG TABS tablet TAKE 1 TABLET(20 MG) BY MOUTH DAILY WITH SUPPER 90 tablet 1   No current facility-administered medications for this visit.    Allergies  Allergen Reactions  . Naproxen Other (See Comments)    (Naprosyn *ANALGESICS - ANTI-INFLAMMATORY*) Nausea, Abdominal pain    Review of Systems:  Constitutional: Denies fever, chills, diaphoresis, appetite change and fatigue.  HEENT: Some allergies. Respiratory: Denies SOB, DOE,, chest tightness,  and wheezing.  Has cough with hoarseness Cardiovascular: Denies chest pain, palpitations and leg swelling.  Genitourinary: Denies dysuria, urgency, frequency, hematuria, flank pain and difficulty urinating.  Musculoskeletal: Denies myalgias, back pain, joint swelling, arthralgias and gait  problem.  Skin: No rash.  Neurological: Denies dizziness, seizures, syncope, weakness, light-headedness, numbness and headaches.  Hematological: Denies adenopathy. Easy bruising, personal or family bleeding history  Psychiatric/Behavioral: No anxiety or depression.  Has been getting some forgetful.     Physical Exam:    BP 116/72   Pulse 82   Temp 97.7 F (36.5 C)   Ht 5\' 11"  (1.803 m)   Wt 186 lb 8 oz (84.6 kg)   BMI 26.01 kg/m  Wt Readings from Last 3 Encounters:  03/29/19 186 lb 8 oz (84.6 kg)  03/19/19 181 lb 12.8 oz (82.5 kg)  01/29/19 178 lb 6.4 oz (80.9 kg)  Constitutional:  Well-developed, in no acute distress. Psychiatric: Normal mood and affect. Behavior is normal. HEENT: Pupils normal.  Conjunctivae are normal. No scleral icterus. Neck supple.  Cardiovascular: Normal rate, regular rhythm. No edema Pulmonary/chest: Effort normal and breath sounds normal. No wheezing, rales or rhonchi. Abdominal: Soft, nondistended. Nontender. Bowel sounds active throughout. There are no masses palpable. No hepatomegaly. Rectal:  defered Neurological: Alert and oriented to person place and time. Skin: Skin is warm and dry. No rashes noted.  Data Reviewed: I have personally reviewed following labs and imaging studies  CBC: CBC Latest Ref Rng & Units 01/11/2019 01/10/2019  WBC 4.0 - 10.5 K/uL 9.1 10.4  Hemoglobin 13.0 - 17.0 g/dL 14.8 17.0  Hematocrit 39.0 - 52.0 % 43.0 49.0  Platelets 150 - 400 K/uL 186 229    CMP: CMP Latest Ref Rng & Units 01/11/2019 01/10/2019 09/06/2018  Glucose 70 - 99 mg/dL 98 92 71  BUN 8 - 23 mg/dL 10 10 17   Creatinine 0.61 - 1.24 mg/dL 0.90 0.76 0.87  Sodium 135 - 145 mmol/L 133(L) 131(L) 134(L)  Potassium 3.5 - 5.1 mmol/L 4.1 3.8 4.6  Chloride 98 - 111 mmol/L 102 96(L) 99  CO2 22 - 32 mmol/L 24 20(L) 26  Calcium 8.9 - 10.3 mg/dL 8.4(L) 9.9 9.7  Total Protein 6.0 - 8.3 g/dL - - 6.9  Total Bilirubin 0.2 - 1.2 mg/dL - - 0.6  Alkaline Phos 39 -  117 U/L - - 80  AST 0 - 37 U/L - - 19  ALT 0 - 53 U/L - - 29     Carmell Austria, MD 03/29/2019, 10:27 AM  Cc: Park Liter, MD

## 2019-03-29 NOTE — Patient Instructions (Addendum)
If you are age 69 or older, your body mass index should be between 23-30. Your Body mass index is 26.01 kg/m. If this is out of the aforementioned range listed, please consider follow up with your Primary Care Provider.  If you are age 14 or younger, your body mass index should be between 19-25. Your Body mass index is 26.01 kg/m. If this is out of the aformentioned range listed, please consider follow up with your Primary Care Provider.   We have sent the following medications to your pharmacy for you to pick up at your convenience: Protonix  Continue Pepcid at bedtime.   You have been scheduled for a Barium Esophogram at [facility] (1st floor of the hospital) on 04/09/19 at 10:30am. Please arrive 15 minutes prior to your appointment for registration. Make certain not to have anything to eat or drink 3 hours prior to your test. If you need to reschedule for any reason, please contact radiology at [phone] to do so. __________________________________________________________________ A barium swallow is an examination that concentrates on views of the esophagus. This tends to be a double contrast exam (barium and two liquids which, when combined, create a gas to distend the wall of the oesophagus) or single contrast (non-ionic iodine based). The study is usually tailored to your symptoms so a good history is essential. Attention is paid during the study to the form, structure and configuration of the esophagus, looking for functional disorders (such as aspiration, dysphagia, achalasia, motility and reflux) EXAMINATION You may be asked to change into a gown, depending on the type of swallow being performed. A radiologist and radiographer will perform the procedure. The radiologist will advise you of the type of contrast selected for your procedure and direct you during the exam. You will be asked to stand, sit or lie in several different positions and to hold a small amount of fluid in  your mouth before being asked to swallow while the imaging is performed .In some instances you may be asked to swallow barium coated marshmallows to assess the motility of a solid food bolus. The exam can be recorded as a digital or video fluoroscopy procedure. POST PROCEDURE It will take 1-2 days for the barium to pass through your system. To facilitate this, it is important, unless otherwise directed, to increase your fluids for the next 24-48hrs and to resume your normal diet.  This test typically takes about 30 minutes to perform. __________________________________________________________________________________   Follow up in 12 weeks.   Thank you,  Dr. Jackquline Denmark

## 2019-04-07 ENCOUNTER — Other Ambulatory Visit: Payer: Self-pay | Admitting: Cardiology

## 2019-04-09 ENCOUNTER — Ambulatory Visit (HOSPITAL_COMMUNITY)
Admission: RE | Admit: 2019-04-09 | Discharge: 2019-04-09 | Disposition: A | Discharge status: Home / Self Care | Payer: Medicare Other | Source: Ambulatory Visit | Attending: Gastroenterology | Admitting: Gastroenterology

## 2019-04-09 ENCOUNTER — Other Ambulatory Visit: Payer: Self-pay

## 2019-04-09 DIAGNOSIS — K219 Gastro-esophageal reflux disease without esophagitis: Secondary | ICD-10-CM | POA: Insufficient documentation

## 2019-04-09 DIAGNOSIS — R1310 Dysphagia, unspecified: Secondary | ICD-10-CM | POA: Diagnosis present

## 2019-04-10 ENCOUNTER — Other Ambulatory Visit: Payer: Self-pay

## 2019-04-10 DIAGNOSIS — K219 Gastro-esophageal reflux disease without esophagitis: Secondary | ICD-10-CM

## 2019-04-10 DIAGNOSIS — R1310 Dysphagia, unspecified: Secondary | ICD-10-CM

## 2019-04-18 ENCOUNTER — Telehealth: Payer: Self-pay | Admitting: Gastroenterology

## 2019-04-18 ENCOUNTER — Other Ambulatory Visit (HOSPITAL_COMMUNITY): Payer: Self-pay

## 2019-04-18 DIAGNOSIS — R059 Cough, unspecified: Secondary | ICD-10-CM

## 2019-04-18 DIAGNOSIS — R05 Cough: Secondary | ICD-10-CM

## 2019-04-18 DIAGNOSIS — R1310 Dysphagia, unspecified: Secondary | ICD-10-CM

## 2019-04-18 NOTE — Telephone Encounter (Signed)
Called and spoke with Hannah-speech therapy dept-she will contact the patient or [name] (patient's wife) for setting up the mod. Barium swallow and speech therapy follow up appt;

## 2019-04-19 ENCOUNTER — Other Ambulatory Visit: Payer: Self-pay

## 2019-04-19 ENCOUNTER — Ambulatory Visit (INDEPENDENT_AMBULATORY_CARE_PROVIDER_SITE_OTHER): Payer: Medicare Other | Admitting: Cardiology

## 2019-04-19 ENCOUNTER — Encounter: Payer: Self-pay | Admitting: Cardiology

## 2019-04-19 VITALS — BP 128/70 | HR 92 | Ht 71.0 in | Wt 184.0 lb

## 2019-04-19 DIAGNOSIS — I10 Essential (primary) hypertension: Secondary | ICD-10-CM

## 2019-04-19 DIAGNOSIS — F17200 Nicotine dependence, unspecified, uncomplicated: Secondary | ICD-10-CM | POA: Diagnosis not present

## 2019-04-19 DIAGNOSIS — I2510 Atherosclerotic heart disease of native coronary artery without angina pectoris: Secondary | ICD-10-CM | POA: Diagnosis not present

## 2019-04-19 DIAGNOSIS — I483 Typical atrial flutter: Secondary | ICD-10-CM | POA: Diagnosis not present

## 2019-04-19 DIAGNOSIS — E785 Hyperlipidemia, unspecified: Secondary | ICD-10-CM

## 2019-04-19 NOTE — Patient Instructions (Signed)
Medication Instructions:  Your physician recommends that you continue on your current medications as directed. Please refer to the Current Medication list given to you today.  *If you need a refill on your cardiac medications before your next appointment, please call your pharmacy*  Lab Work: Your physician recommends that you return for lab work today: lipid   If you have labs (blood work) drawn today and your tests are completely normal, you will receive your results only by: Marland Kitchen MyChart Message (if you have MyChart) OR . A paper copy in the mail If you have any lab test that is abnormal or we need to change your treatment, we will call you to review the results.  Testing/Procedures: None.   Follow-Up: At [facility], you and your health needs are our priority.  As part of our continuing mission to provide you with exceptional heart care, we have created designated Provider Care Teams.  These Care Teams include your primary Cardiologist (physician) and Advanced Practice Providers (APPs -  Physician Assistants and Nurse Practitioners) who all work together to provide you with the care you need, when you need it.  Your next appointment:   6 month(s)  The format for your next appointment:   In Person  Provider:   You may see Jenne Campus, MD or the following Advanced Practice Provider on your designated Care Team:    Laurann Montana, FNP   Other Instructions

## 2019-04-19 NOTE — Progress Notes (Signed)
Cardiology Office Note:    Date:  04/19/2019   ID:  Robert Lang, DOB Jul 30, 1950, MRN [id number]  PCP:  Mackie Pai, [name]  Cardiologist:  Jenne Campus, MD    Referring MD: Elise Benne   Chief Complaint  Patient presents with  . Follow-up    1 MO FU     History of Present Illness:    Robert Lang is a 69 y.o. male  with a hx of CAD s/p PCI to LAD/RCA, atrial flutter s/p ablation 2017, HLD, HTNlast seen while hospitalized.  LHC at [facility] 2017 with patent stent in prox LAD, 90% occlusion in small first diagonal artery and 25% occlusion of RCA w/ good EF 60%. Atrial flutter ablation at [facility] in 2017. Stress test 10/30/18 with no perfusion defect described as low risk study.   Presented to [facility] ED 01/10/19 with chest pain, code STEMI called, transferred to [facility]. Troponin peaked at [phone]. Echo preserved LV function. Underwent cardiac cath 01/10/19 with PCI and DES to RCA. Recommended for triple therapy for 1 month then stop aspirin. 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 His diltiazem was switched to coreg while hospitalized. He is coming today to my office for follow-up.  Overall he is doing great.  Last time when I see him there was some issue with heartburn.  That is completely gone.  He is seeing gastroenterologist he did have about a 1 barium swallow done.  He also quit smoking at the time of his last intervention since that time he is abstaining from smoking and I congratulated him for it.  Denies have any cardiac complaint.  There is no chest pain, no tightness, no pressure no burning or squeezing in the chest.  Past Medical History:  Diagnosis Date  . Allergy   . Anxiety   . Arthritis   . Atrial fibrillation ([place])   . CAD (coronary artery disease)   . COPD (chronic obstructive pulmonary disease) ([place])   . Depression   . Depression   .  ETOH abuse 01/11/2019   6 pack of beer per day  . GERD (gastroesophageal reflux disease)   . Heart attack ([place])   . History of colon polyps   . Hyperlipidemia   . Hypertension   . Kidney stones   . Smoker 01/11/2019    Past Surgical History:  Procedure Laterality Date  . ATRIAL FIBRILLATION ABLATION  10/2015  . BACK SURGERY    . CARDIOVERSION  2017  . CATARACT EXTRACTION Bilateral 2017   March and April 2017  . COLONOSCOPY  2018  . CORONARY STENT PLACEMENT  2012  . CORONARY/GRAFT ACUTE MI REVASCULARIZATION N/A 01/10/2019   Procedure: CORONARY/GRAFT ACUTE MI REVASCULARIZATION;  Surgeon: Leonie Man, MD;  Location: [place] [facility];  Service: Cardiovascular;  Laterality: N/A;  . INGUINAL HERNIA REPAIR     over 20 years ago  . LEFT HEART CATH AND CORONARY ANGIOGRAPHY N/A 01/10/2019   Procedure: LEFT HEART CATH AND CORONARY ANGIOGRAPHY;  Surgeon: Leonie Man, MD;  Location: [place] [facility];  Service: Cardiovascular;  Laterality: N/A;  . LUMBAR LAMINECTOMY Bilateral 04/05/2016   L2-L5   . VENTRAL HERNIA REPAIR  2018    Current Medications: Current Meds  Medication Sig  . atorvastatin (LIPITOR) 80 MG tablet Take 1 tablet (80 mg total) by mouth daily at 6 PM.  . buPROPion (WELLBUTRIN XL) 300 MG 24  hr tablet TAKE 1 TABLET(300 MG) BY MOUTH DAILY  . carvedilol (COREG) 3.125 MG tablet Take 1 tablet (3.125 mg total) by mouth 2 (two) times daily with a meal.  . Cholecalciferol (VITAMIN D-1000 MAX ST) 25 MCG (1000 UT) tablet Take 1,000 Units by mouth daily.   . clopidogrel (PLAVIX) 75 MG tablet Take 1 tablet (75 mg total) by mouth daily with breakfast.  . enalapril (VASOTEC) 5 MG tablet TAKE 1 TABLET(5 MG) BY MOUTH DAILY  . famotidine (PEPCID) 40 MG tablet Take 1 tablet (40 mg total) by mouth daily.  . fexofenadine (ALLEGRA) 180 MG tablet Take 180 mg by mouth at bedtime.   . finasteride (PROSCAR) 5 MG tablet 1 tab po q day  . gabapentin (NEURONTIN) 100 MG capsule 2-3 tab  po daily prn pain  . nitroGLYCERIN (NITROSTAT) 0.4 MG SL tablet Place 1 tablet (0.4 mg total) under the tongue every 5 (five) minutes as needed for chest pain.  . Omega-3 Fatty Acids (FISH OIL) 1000 MG CAPS Take 1,000 mg by mouth.  . pantoprazole (PROTONIX) 40 MG tablet Take 1 tablet (40 mg total) by mouth daily.  . sertraline (ZOLOFT) 100 MG tablet Take 150 mg by mouth at bedtime.   . solifenacin (VESICARE) 5 MG tablet Take 5 mg by mouth daily.  . tamsulosin (FLOMAX) 0.4 MG CAPS capsule TAKE 1 CAPSULE(0.4 MG) BY MOUTH DAILY  . Turmeric 500 MG CAPS Take 1 capsule by mouth 2 (two) times daily.   . vitamin B-12 (CYANOCOBALAMIN) 1000 MCG tablet Take 1,000 mcg by mouth daily.  Alveda Reasons 20 MG TABS tablet TAKE 1 TABLET(20 MG) BY MOUTH DAILY WITH SUPPER     Allergies:   Naproxen   Social History   Socioeconomic History  . Marital status: Married    Spouse name: Not on file  . Number of children: Not on file  . Years of education: Not on file  . Highest education level: Not on file  Occupational History  . Not on file  Tobacco Use  . Smoking status: Former Smoker    Packs/day: 1.00    Years: 30.00    Pack years: 30.00  . Smokeless tobacco: Former Systems developer  . Tobacco comment: 01/10/2019  Substance and Sexual Activity  . Alcohol use: Yes    Alcohol/week: 6.0 standard drinks    Types: 6 Cans of beer per week    Comment: 6 pack a day  . Drug use: Never  . Sexual activity: Not on file  Other Topics Concern  . Not on file  Social History Narrative  . Not on file   Social Determinants of Health   Financial Resource Strain:   . Difficulty of Paying Living Expenses: Not on file  Food Insecurity:   . Worried About Charity fundraiser in the Last Year: Not on file  . Ran Out of Food in the Last Year: Not on file  Transportation Needs: No Transportation Needs  . Lack of Transportation (Medical): No  . Lack of Transportation (Non-Medical): No  Physical Activity:   . Days of Exercise per  Week: Not on file  . Minutes of Exercise per Session: Not on file  Stress:   . Feeling of Stress : Not on file  Social Connections:   . Frequency of Communication with Friends and Family: Not on file  . Frequency of Social Gatherings with Friends and Family: Not on file  . Attends Religious Services: Not on file  . Active Member of  Clubs or Organizations: Not on file  . Attends Archivist Meetings: Not on file  . Marital Status: Not on file     Family History: The patient's family history includes Colon cancer in his father; Throat cancer in his brother. ROS:   Please see the history of present illness.    All 14 point review of systems negative except as described per history of present illness  EKGs/Labs/Other Studies Reviewed:      Recent Labs: 09/06/2018: ALT 29 01/11/2019: BUN 10; Creatinine, Ser 0.90; Hemoglobin 14.8; Platelets 186; Potassium 4.1; Sodium 133  Recent Lipid Panel    Component Value Date/Time   CHOL 122 01/10/2019 1328   TRIG 215 (H) 01/10/2019 1328   HDL 36 (L) 01/10/2019 1328   CHOLHDL 3.4 01/10/2019 1328   VLDL 43 (H) 01/10/2019 1328   LDLCALC NOT CALCULATED 01/10/2019 1328   LDLDIRECT 66.0 09/06/2018 0924    Physical Exam:    VS:  BP 128/70   Pulse 92   Ht 5\' 11"  (1.803 m)   Wt 184 lb (83.5 kg)   SpO2 97%   BMI 25.66 kg/m     Wt Readings from Last 3 Encounters:  04/19/19 184 lb (83.5 kg)  03/29/19 186 lb 8 oz (84.6 kg)  03/19/19 181 lb 12.8 oz (82.5 kg)     GEN:  Well nourished, well developed in no acute distress HEENT: Normal NECK: No JVD; No carotid bruits LYMPHATICS: No lymphadenopathy CARDIAC: RRR, no murmurs, no rubs, no gallops RESPIRATORY:  Clear to auscultation without rales, wheezing or rhonchi  ABDOMEN: Soft, non-tender, non-distended MUSCULOSKELETAL:  No edema; No deformity  SKIN: Warm and dry LOWER EXTREMITIES: no swelling NEUROLOGIC:  Alert and oriented x 3 PSYCHIATRIC:  Normal affect   ASSESSMENT:     1. Coronary artery disease involving native coronary artery of native heart without angina pectoris   2. Essential hypertension   3. Typical atrial flutter (HCC)   4. Smoking   5. Dyslipidemia, goal LDL below 70    PLAN:    In order of problems listed above:  1. Coronary disease doing well from that point review asymptomatic on Plavix as well as Xarelto which I will continue. 2. Essential hypertension blood pressure well controlled today.  128/70 we will continue present medications. 3. History of typical atrial flutter denies having a palpitation we will maintain him on anticoagulation form of Xarelto. 4. Smoking he quit smoking on the day that he left the hospital.  Abstain from smoking since that time.  I congratulated him for it and encouraged him to stay away from smoking. 5. Dyslipidemia we will check his fasting lipid profile in the meantime we will maintain him on high intensity statin.   Medication Adjustments/Labs and Tests Ordered: Current medicines are reviewed at length with the patient today.  Concerns regarding medicines are outlined above.  No orders of the defined types were placed in this encounter.  Medication changes: No orders of the defined types were placed in this encounter.   Signed, Park Liter, MD, [facility] 04/19/2019 11:31 AM    [place]

## 2019-04-21 LAB — COMPREHENSIVE METABOLIC PANEL
ALT: 28 IU/L (ref 0–44)
AST: 29 IU/L (ref 0–40)
Albumin/Globulin Ratio: 1.7 (ref 1.2–2.2)
Albumin: 4.3 g/dL (ref 3.8–4.8)
Alkaline Phosphatase: 105 IU/L (ref 39–117)
BUN/Creatinine Ratio: 10 (ref 10–24)
BUN: 9 mg/dL (ref 8–27)
Bilirubin Total: 0.5 mg/dL (ref 0.0–1.2)
CO2: 26 mmol/L (ref 20–29)
Calcium: 9.5 mg/dL (ref 8.6–10.2)
Chloride: 102 mmol/L (ref 96–106)
Creatinine, Ser: 0.89 mg/dL (ref 0.76–1.27)
GFR calc Af Amer: 101 mL/min/{1.73_m2} (ref >59)
GFR calc non Af Amer: 87 mL/min/{1.73_m2} (ref >59)
Globulin, Total: 2.6 g/dL (ref 1.5–4.5)
Glucose: 114 mg/dL — ABNORMAL HIGH (ref 65–99)
Potassium: 5.1 mmol/L (ref 3.5–5.2)
Sodium: 138 mmol/L (ref 134–144)
Total Protein: 6.9 g/dL (ref 6.0–8.5)

## 2019-04-21 LAB — LIPID PANEL
Chol/HDL Ratio: 2.6 ratio (ref 0.0–5.0)
Cholesterol, Total: 119 mg/dL (ref 100–199)
HDL: 46 mg/dL (ref >39)
LDL Chol Calc (NIH): 51 mg/dL (ref 0–99)
Triglycerides: 121 mg/dL (ref 0–149)
VLDL Cholesterol Cal: 22 mg/dL (ref 5–40)

## 2019-04-23 ENCOUNTER — Ambulatory Visit (HOSPITAL_COMMUNITY)
Admission: RE | Admit: 2019-04-23 | Discharge: 2019-04-23 | Disposition: A | Discharge status: Home / Self Care | Payer: Medicare Other | Source: Ambulatory Visit | Attending: Gastroenterology | Admitting: Gastroenterology

## 2019-04-23 ENCOUNTER — Other Ambulatory Visit: Payer: Self-pay

## 2019-04-23 ENCOUNTER — Ambulatory Visit (HOSPITAL_COMMUNITY)
Admission: RE | Admit: 2019-04-23 | Discharge: 2019-04-23 | Disposition: A | Discharge status: Home / Self Care | Payer: Medicare Other | Source: Ambulatory Visit | Attending: Medical | Admitting: Medical

## 2019-04-23 DIAGNOSIS — R1310 Dysphagia, unspecified: Secondary | ICD-10-CM | POA: Diagnosis present

## 2019-04-23 DIAGNOSIS — R05 Cough: Secondary | ICD-10-CM | POA: Diagnosis present

## 2019-04-23 DIAGNOSIS — K219 Gastro-esophageal reflux disease without esophagitis: Secondary | ICD-10-CM

## 2019-04-23 DIAGNOSIS — R059 Cough, unspecified: Secondary | ICD-10-CM

## 2019-04-23 NOTE — Progress Notes (Signed)
Modified Barium Swallow Progress Note  Patient Details  Name: Robert Lang MRN: [id number] Date of Birth: [birth year]  Today's Date: 04/23/2019  Modified Barium Swallow completed.  Full report located under Chart Review in the Imaging Section.  Brief recommendations include the following:  Clinical Impression Paitient presents with oropharyngeal functioing that is [facility]. Pt reported he currently has some coughing episodes, but is not increasingly worse during/after eating or drinking. Pt was coughing before, during, and after study, but was not noted to be associated with oropharyngeal difficulties. Oral and pharyngeal phases noted to be unremarkable. No penetration/aspiration observed. During study patient complained of feeling like something was stuck in his throat; however, his pharynx was observed to be clear. Following the trial of the pill, it appeared to sit in esophagus, but was cleared with x2 liquid wash. Pt did not report any difficulties, or complain of something to be "stuck" at that time. Recommend continued regular diet with thin liquids at this time.   Swallow Evaluation Recommendations   Recommended Consults: Consider GI evaluation;Consider ENT evaluation;Consider esophageal assessment   SLP Diet Recommendations: Thin liquid;Regular solids   Liquid Administration via: Straw;Cup   Medication Administration: Whole meds with liquid   Supervision: Patient able to self feed   Compensations: Slow rate;Small sips/bites   Postural Changes: Remain semi-upright after after feeds/meals (Comment);Seated upright at 90 degrees   Oral Care Recommendations: Patient independent with oral care       Aline August, Student SLP Office: [phone]  04/23/2019,1:06 PM

## 2019-05-03 NOTE — Telephone Encounter (Signed)
RN called and spoke with Anderson Malta, speech therapy receptionist, and she reports she will call the patient and set up an appt date/time with this patient for speech therapy consult; amb ref to ST placed in Epic;

## 2019-05-03 NOTE — Telephone Encounter (Signed)
Spoke with patients wife who said her husband wants to proceed with speech therapy.

## 2019-05-09 ENCOUNTER — Other Ambulatory Visit: Payer: Self-pay | Admitting: Interventional Cardiology

## 2019-05-09 NOTE — Telephone Encounter (Signed)
Pt sees Dr. Agustin Cree in [place]. Please address

## 2019-05-16 ENCOUNTER — Other Ambulatory Visit: Payer: Self-pay

## 2019-05-16 ENCOUNTER — Ambulatory Visit: Payer: Medicare Other | Attending: Medical | Admitting: Speech Pathology

## 2019-05-16 DIAGNOSIS — R498 Other voice and resonance disorders: Secondary | ICD-10-CM | POA: Diagnosis present

## 2019-05-16 NOTE — Telephone Encounter (Signed)
As per previous note He should be seen in ENT due to hoarseness. If he is agreeable, pl make appointment with ENT.  RG

## 2019-05-16 NOTE — Therapy (Signed)
[facility] [address] [place], [place], [address] Phone: [phone]   Fax:  [phone]  Speech Language Pathology Evaluation  Patient Details  Name: Robert Lang MRN: [id number] Date of Birth: [birth year] Referring Provider (SLP): Dr. Jackquline Denmark   Encounter Date: 05/16/2019  End of Session - 05/16/19 1428    Visit Number  1    Number of Visits  17    Date for SLP Re-Evaluation  07/11/19    Authorization Type  will await ENT consult and proceed with ST as indicated. Pt to call to schedule based on ENT evaluation    SLP Start Time  0935    SLP Stop Time   1020    SLP Time Calculation (min)  45 min    Activity Tolerance  Patient tolerated treatment well       Past Medical History:  Diagnosis Date  . Allergy   . Anxiety   . Arthritis   . Atrial fibrillation ([place])   . CAD (coronary artery disease)   . COPD (chronic obstructive pulmonary disease) ([place])   . Depression   . Depression   . ETOH abuse 01/11/2019   6 pack of beer per day  . GERD (gastroesophageal reflux disease)   . Heart attack ([place])   . History of colon polyps   . Hyperlipidemia   . Hypertension   . Kidney stones   . Smoker 01/11/2019    Past Surgical History:  Procedure Laterality Date  . ATRIAL FIBRILLATION ABLATION  10/2015  . BACK SURGERY    . CARDIOVERSION  2017  . CATARACT EXTRACTION Bilateral 2017   March and April 2017  . COLONOSCOPY  2018  . CORONARY STENT PLACEMENT  2012  . CORONARY/GRAFT ACUTE MI REVASCULARIZATION N/A 01/10/2019   Procedure: CORONARY/GRAFT ACUTE MI REVASCULARIZATION;  Surgeon: Leonie Man, MD;  Location: [place] [facility];  Service: Cardiovascular;  Laterality: N/A;  . INGUINAL HERNIA REPAIR     over 20 years ago  . LEFT HEART CATH AND CORONARY ANGIOGRAPHY N/A 01/10/2019   Procedure: LEFT HEART CATH AND CORONARY ANGIOGRAPHY;  Surgeon: Leonie Man, MD;  Location: [place] [facility];  Service:  Cardiovascular;  Laterality: N/A;  . LUMBAR LAMINECTOMY Bilateral 04/05/2016   L2-L5   . VENTRAL HERNIA REPAIR  2018    There were no vitals filed for this visit.  Subjective Assessment - 05/16/19 1409    Subjective  "I'm hoarse and sometimes it hard to hear me"    Patient is accompained by:  Family member   sposue   Currently in Pain?  No/denies         SLP Evaluation [facility] - 05/16/19 1010      SLP Visit Information   SLP Received On  05/16/19    Referring Provider (SLP)  Dr. Jackquline Denmark    Onset Date  12/2019    Medical Diagnosis  dysphagia      Subjective   Patient/Family Stated Goal  "To improve my voice quality"      General Information   HPI  Robert Lang reports h/o dysphagia, globus sensation, throat clears and hoarseness since STEMI 10/21. EGD 04/09/19 and MBSS 04/23/19 WNL with regular solids and thin liquids recommended. Coughing noted during MBSS without indicdence of penetration or aspiration. He is referred for ongoing c/o oropharyngeal dysphagia. Today, his primary complaint is hoarseness.     Mobility Status  uses cane usually      Balance Screen  Has the patient fallen in the past 6 months  Yes    How many times?  1    Has the patient had a decrease in activity level because of a fear of falling?   No    Is the patient reluctant to leave their home because of a fear of falling?   No      Prior Functional Status   Cognitive/Linguistic Baseline  Baseline deficits   prior cognitive testing showed "on the verge of dementia"    Baseline deficit details  mild memory    Type of Home  House   townhouse    Lives With  Spouse    Available Support  Family    Vocation  Retired      Associate Professor   Overall Cognitive Status  Within Functional Limits for tasks assessed      Auditory Comprehension   Overall Auditory Comprehension  Appears within functional limits for tasks assessed    Interfering Components  Hearing    EffectiveTechniques  Increased volume;Extra  processing time;Visual/Gestural cues;Repetition;Slowed speech;Stressing words      Verbal Expression   Overall Verbal Expression  Appears within functional limits for tasks assessed   pt reports word finding diffiuclties on occasion     Oral Motor/Sensory Function   Overall Oral Motor/Sensory Function  Appears within functional limits for tasks assessed      Motor Speech   Overall Motor Speech  Appears within functional limits for tasks assessed    Phonation  Hoarse    Resonance  Within functional limits    Articulation  Within functional limitis    Intelligibility  Intelligibility reduced    Word  75-100% accurate    Phrase  75-100% accurate    Sentence  75-100% accurate    Conversation  75-100% accurate    Motor Planning  Witnin functional limits    Effective Techniques  Increased vocal intensity    Phonation  Impaired    Vocal Abuses  Habitual Cough/Throat Clear;Smoking    Tension Present  Neck    Volume  Appropriate    Pitch  Appropriate                      SLP Education - 05/16/19 1427    Education Details  alternative to throat clear, recommend ENT - will need MD referral for ENT    Person(s) Educated  Patient;Spouse    Methods  Explanation;Handout;Verbal cues    Comprehension  Verbalized understanding;Need further instruction       SLP Short Term Goals - 05/16/19 1433      SLP SHORT TERM GOAL #1   Title  Pt will reduce throat clears, insted use hard swallow or blow and swallow as alternative, with spouse notice 5 or less throat clears a day    Time  4    Period  Weeks    Status  New      SLP SHORT TERM GOAL #2   Title  Pt will report following 2 aspects of vocal hygience/conservation program over 4 sessions    Time  4    Period  Weeks    Status  New      SLP SHORT TERM GOAL #3   Title  Pt will complete vocal function exercises with occasional min A over 3 sessions    Time  4    Period  Weeks    Status  New      SLP SHORT TERM GOAL #  4    Title  Pt will increase maximum phonation time compared with 1st measurement taken over 2 visits    Time  4    Period  Weeks    Status  New       SLP Long Term Goals - 05/16/19 1437      SLP LONG TERM GOAL #1   Title  Pt will perform vocal function exercises with mod I over 3 sessions    Time  8    Period  Weeks    Status  New      SLP LONG TERM GOAL #2   Title  Pt will decrease score on Glottal Function Index to less than 8.    Time  8    Period  Weeks    Status  New      SLP LONG TERM GOAL #3   Title  Pt will report following 3 aspects of vocal hygiene program with mod I over 2 sessions    Time  8    Period  Weeks    Status  New      SLP LONG TERM GOAL #4   Title  Pt will demonstrate WFL vocal quality and endurance in 20 minute conversation in noisy environment over 2 sessions    Time  [place] - 05/16/19 1429    Clinical Impression Statement  Keshawn Dunaway is 69 y.o. male referred for outpt speech therapy due to difficulty swallowing. EGD and MBSS are WNL, regular diet and thin liquids recommended. Coughing noted during MBSS in absence of apsiration or penetration. Today, Mr. Escoto's primary concern is globus sensation and hoarseness. He reports hoarseness began s/p STEMI 10/21. Both pt and his spouse deny hoarseness prior to this. Cas[place]en reports h/o "severe" heartburn prior to STEMI, and has been on anti reflux meds. He reports running out of air when speaking at times. Pt was observed to clear his throat 5x during this evaluation. He affirms he clears his throat throughout the day.   Voice case history: Pt reports moderate voice use. Pt reports daily water intake as 32 oz per day and caffeine intake is 8oz oz per day. Pt history of smoking,  just quit after STEMI in 10/21. Onset of voice problem was STEMI  and began 10/21 .  Voice Related Quality of Life (VRQOL) score is 77.5 (>80 is WNL). Habitual pitch is 138.6 Hz, range  110-277.2 Hz in extended speech, WNL for age/gender. Maximum Phonation Time (MPT) for /a/ is 8.39 seconds (<12 seconds is suggestive of glottal/respiratory insufficiency). /s/ to /z/ ratio is 1.27 (>1.4 may indicate laryngeal pathology)). Vocal quality is hoarse, gravelly with pitch breaks, pt reports is affects his intelligibility. Reflux Severity Index is 23 (<10-13 is WNL)  I recommend ENT consult to view vocal folds and r/o vocal fold pathology in light of long term smoking. I recommend skilled ST pending recommendation from ENT. Pt is aware of ENT recommendation.   PO trials of soft solid and thin liquid without s/s of aspiration. Pt does report globus sensation and states "It takes longer to get it down"    Speech Therapy Frequency  2x / week    Duration  One additional visit   8 weeks or 17 visits   Treatment/Interventions  Cueing hierarchy;SLP instruction and feedback;Environmental controls;Functional tasks;Compensatory strategies;Patient/family education;Multimodal communcation approach;Internal/external aids;Compensatory techniques;Diet toleration management by SLP;Aspiration precaution training  Potential to Achieve Goals  Good       Patient will benefit from skilled therapeutic intervention in order to improve the following deficits and impairments:   Other voice and resonance disorders    Problem List Patient Active Problem List   Diagnosis Date Noted  . Acute ST elevation myocardial infarction (STEMI) of inferolateral wall ([place]) 01/10/2019  . Dyspnea on exertion 10/20/2018  . Status post ablation of atrial flutter 07/06/2018  . Coronary artery disease 07/06/2018  . Essential hypertension 07/06/2018  . Dyslipidemia, goal LDL below 70 07/06/2018  . Smoking 07/06/2018  . Neuropathy 07/06/2018  . Dizziness 10/28/2017  . Falls 10/28/2017  . Heart palpitations 01/27/2017  . Anticoagulated 07/01/2016  . H/O amiodarone therapy 07/01/2016  . PLMD (periodic limb movement  disorder) 11/04/2015  . Obstructive sleep apnea 08/05/2015  . Atrial flutter ([place]) 06/26/2015  . Chest pain 06/26/2015  . COPD (chronic obstructive pulmonary disease) ([place]) 06/26/2015  . 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 Allergic rhinitis due to pollen 09/03/2013  . DDD (degenerative disc disease), cervical 09/03/2013  . Depressive disorder 09/03/2013  . Esophageal reflux 09/03/2013  . Neck pain 09/03/2013  . Polycythemia, secondary 09/03/2013  . Pure hypercholesterolemia 09/03/2013  . Screening for prostate cancer 09/03/2013    Cynthia Stainback, Annye Rusk MS, [place] 05/16/2019, 2:41 PM  [facility] [address] [place], [place], [address] Phone: [phone]   Fax:  [phone]  Name: Rylann Cristina MRN: [id number] Date of Birth: 1950/09/02

## 2019-05-16 NOTE — Telephone Encounter (Signed)
For review

## 2019-05-16 NOTE — Telephone Encounter (Signed)
Pt's wife stated that she made the appt to Neuro this morning and was informed that Medicare will not pay for speech therapy.  Pt was told by Neuro office that pt should have been referred to ENT prior to Neuro appt.    Wife stated that pt has decided to not follow through with referral.  He will try some techniques advised by Neuro MD instead.

## 2019-05-16 NOTE — Patient Instructions (Addendum)
  Try to eliminate clearing your throat - instead do a hard swallow or take a sip of water and hard swallow or do a forceful blow then swallow hard  Clearing your throat irritates your vocal cords and can make your hoarseness worse  Try doing effortful or hard swallows with your liquid sips when you feel food being stuck  Dr. Blenda Nicely - I will request a referral from

## 2019-05-17 NOTE — Telephone Encounter (Signed)
Called and spoke with patient-patient informed of  MD recommendations; patient is NOT agreeable with plan of care; patient adamantly declined to have ENT referral sent on his behalf;  Patient verbalized understanding of information/instructions;  Patient was advised to call the office at [phone] if questions/concerns arise;

## 2019-05-18 ENCOUNTER — Ambulatory Visit: Payer: Medicare Other

## 2019-05-22 ENCOUNTER — Telehealth: Payer: Self-pay

## 2019-05-22 NOTE — Telephone Encounter (Signed)
-----   Message from Jackquline Denmark, MD sent at 05/22/2019  2:48 PM EST ----- Regarding: FW: ENT referral Can you please set him up for ENT consultation with Dr. Estill Bamberg as below. RG ----- Message ----- From: Cliffton Asters, CCC-SLP Sent: 05/21/2019   2:35 PM EST To: Jackquline Denmark, MD Subject: ENT referral                                   Dr. Lyndel Safe, I evaluated patient who continues to c/o globus sensation, cough and hoarse, gravelly voice. I recommend referral to ENT, Dr. Gavin Pound. Please order if in agreement.  Thanks,  Georgiann Hahn MS, CCC-SLP

## 2019-05-22 NOTE — Telephone Encounter (Signed)
ENT referral to be sent per MD recommendations

## 2019-05-23 NOTE — Telephone Encounter (Signed)
Referral sent per MD recommendations;

## 2019-05-28 NOTE — Telephone Encounter (Signed)
Information received that the ENT office has tried reaching this patient, however, he has not returned a call to the office at [phone] to Dr. Lind Guest; they report an attempt will be made to get the patient scheduled to see ENT specialist;

## 2019-06-07 ENCOUNTER — Other Ambulatory Visit: Payer: Self-pay | Admitting: Medical

## 2019-06-12 ENCOUNTER — Telehealth: Payer: Self-pay | Admitting: Medical

## 2019-06-12 MED ORDER — GABAPENTIN 100 MG PO CAPS: ORAL_CAPSULE | ORAL | 0 refills | Status: DC

## 2019-06-12 NOTE — Telephone Encounter (Signed)
Medication: gabapentin (NEURONTIN) 100 MG capsule    Has the patient contacted their pharmacy? Yes.   (If no, request that the patient contact the pharmacy for the refill.) (If yes, when and what did the pharmacy advise?)  Preferred Pharmacy (with phone number or street name):  [facility] [place], [address]  [place], [place] [address]  Phone:  [phone] Fax:  [phone]  DEA #:  [id number]        Agent: Please be advised that RX refills may take up to 3 business days. We ask that you follow-up with your pharmacy.

## 2019-06-14 ENCOUNTER — Other Ambulatory Visit: Payer: Self-pay | Admitting: Medical

## 2019-06-29 ENCOUNTER — Other Ambulatory Visit: Payer: Self-pay | Admitting: Cardiology

## 2019-06-29 NOTE — Telephone Encounter (Signed)
Lat OV 03/2019 Scr = 0.89 on 03/2019 CrCl = 31ml/min Dx A.Fib

## 2019-07-07 ENCOUNTER — Other Ambulatory Visit: Payer: Self-pay | Admitting: Medical

## 2019-07-26 ENCOUNTER — Other Ambulatory Visit: Payer: Self-pay | Admitting: Medical

## 2019-09-06 ENCOUNTER — Other Ambulatory Visit: Payer: Self-pay | Admitting: Medical

## 2019-10-18 ENCOUNTER — Other Ambulatory Visit: Payer: Self-pay | Admitting: Gastroenterology

## 2019-10-19 ENCOUNTER — Encounter (HOSPITAL_BASED_OUTPATIENT_CLINIC_OR_DEPARTMENT_OTHER): Payer: Self-pay | Admitting: Emergency Medicine

## 2019-10-19 ENCOUNTER — Emergency Department (HOSPITAL_BASED_OUTPATIENT_CLINIC_OR_DEPARTMENT_OTHER): Payer: Medicare Other

## 2019-10-19 ENCOUNTER — Other Ambulatory Visit: Payer: Self-pay

## 2019-10-19 ENCOUNTER — Emergency Department (HOSPITAL_BASED_OUTPATIENT_CLINIC_OR_DEPARTMENT_OTHER)
Admission: EM | Admit: 2019-10-19 | Discharge: 2019-10-19 | Disposition: A | Discharge status: Home / Self Care | Payer: Medicare Other | Attending: Emergency Medicine | Admitting: Emergency Medicine

## 2019-10-19 DIAGNOSIS — I119 Hypertensive heart disease without heart failure: Secondary | ICD-10-CM | POA: Insufficient documentation

## 2019-10-19 DIAGNOSIS — R0789 Other chest pain: Secondary | ICD-10-CM | POA: Insufficient documentation

## 2019-10-19 DIAGNOSIS — Z87891 Personal history of nicotine dependence: Secondary | ICD-10-CM | POA: Diagnosis not present

## 2019-10-19 DIAGNOSIS — Z955 Presence of coronary angioplasty implant and graft: Secondary | ICD-10-CM | POA: Diagnosis not present

## 2019-10-19 DIAGNOSIS — J449 Chronic obstructive pulmonary disease, unspecified: Secondary | ICD-10-CM | POA: Insufficient documentation

## 2019-10-19 DIAGNOSIS — Z79899 Other long term (current) drug therapy: Secondary | ICD-10-CM | POA: Insufficient documentation

## 2019-10-19 LAB — TROPONIN I (HIGH SENSITIVITY)
Troponin I (High Sensitivity): 10 ng/L (ref <18)
Troponin I (High Sensitivity): 10 ng/L (ref <18)

## 2019-10-19 LAB — BASIC METABOLIC PANEL
Anion gap: 11 (ref 5–15)
BUN: 11 mg/dL (ref 8–23)
CO2: 22 mmol/L (ref 22–32)
Calcium: 9.1 mg/dL (ref 8.9–10.3)
Chloride: 100 mmol/L (ref 98–111)
Creatinine, Ser: 0.75 mg/dL (ref 0.61–1.24)
GFR calc Af Amer: >60 mL/min (ref >60)
GFR calc non Af Amer: >60 mL/min (ref >60)
Glucose, Bld: 104 mg/dL — ABNORMAL HIGH (ref 70–99)
Potassium: 4.1 mmol/L (ref 3.5–5.1)
Sodium: 133 mmol/L — ABNORMAL LOW (ref 135–145)

## 2019-10-19 LAB — CBC
HCT: 41.9 % (ref 39.0–52.0)
Hemoglobin: 14.4 g/dL (ref 13.0–17.0)
MCH: 33.6 pg (ref 26.0–34.0)
MCHC: 34.4 g/dL (ref 30.0–36.0)
MCV: 97.7 fL (ref 80.0–100.0)
Platelets: 186 10*3/uL (ref 150–400)
RBC: 4.29 MIL/uL (ref 4.22–5.81)
RDW: 12.6 % (ref 11.5–15.5)
WBC: 6.4 10*3/uL (ref 4.0–10.5)
nRBC: 0 % (ref 0.0–0.2)

## 2019-10-19 MED ORDER — KETOROLAC TROMETHAMINE 15 MG/ML IJ SOLN
15.0000 mg | Freq: Once | INTRAMUSCULAR | Status: AC
  Administered 2019-10-19: 15 mg via INTRAVENOUS
  Filled 2019-10-19: qty 1

## 2019-10-19 MED ORDER — ONDANSETRON HCL 4 MG/2ML IJ SOLN
4.0000 mg | Freq: Once | INTRAMUSCULAR | Status: AC
  Administered 2019-10-19: 4 mg via INTRAVENOUS
  Filled 2019-10-19: qty 2

## 2019-10-19 MED ORDER — PANTOPRAZOLE SODIUM 40 MG IV SOLR
40.0000 mg | Freq: Once | INTRAVENOUS | Status: AC
  Administered 2019-10-19: 40 mg via INTRAVENOUS
  Filled 2019-10-19: qty 40

## 2019-10-19 MED ORDER — SUCRALFATE 1 GM/10ML PO SUSP
1.0000 g | Freq: Three times a day (TID) | ORAL | Status: DC
  Administered 2019-10-19: 1 g via ORAL
  Filled 2019-10-19: qty 10

## 2019-10-19 NOTE — ED Notes (Signed)
ED Provider at bedside. 

## 2019-10-19 NOTE — ED Provider Notes (Signed)
[facility] Provider Note: Georgena Spurling, MD, FACEP  CSN: [id number] MRN: [id number] ARRIVAL: 10/19/19 at [place]: [place]  Chest Pain   HISTORY OF PRESENT ILLNESS  10/19/19 3:55 AM Robert Lang is a 69 y.o. male with left upper, nonradiating, chest pain since yesterday evening about 11:00 PM after eating cantaloupe.  He tried sublingual nitroglycerin x1 and Maalox without relief.  He rates the pain as a 7 out of 10, nothing makes it better or worse.  He describes it as feeling like pressure or indigestion.  He had heartburn earlier as well.  He has not had any change in his baseline shortness of breath.  He has not had diaphoresis.  He is nauseated.   Past Medical History:  Diagnosis Date  . Allergy   . Anxiety   . Arthritis   . Atrial fibrillation ([place])   . CAD (coronary artery disease)   . COPD (chronic obstructive pulmonary disease) ([place])   . Depression   . Depression   . ETOH abuse 01/11/2019   6 pack of beer per day  . GERD (gastroesophageal reflux disease)   . Heart attack ([place])   . History of colon polyps   . Hyperlipidemia   . Hypertension   . Kidney stones   . Smoker 01/11/2019    Past Surgical History:  Procedure Laterality Date  . ATRIAL FIBRILLATION ABLATION  10/2015  . BACK SURGERY    . CARDIOVERSION  2017  . CATARACT EXTRACTION Bilateral 2017   March and April 2017  . COLONOSCOPY  2018  . CORONARY STENT PLACEMENT  2012  . CORONARY/GRAFT ACUTE MI REVASCULARIZATION N/A 01/10/2019   Procedure: CORONARY/GRAFT ACUTE MI REVASCULARIZATION;  Surgeon: Leonie Man, MD;  Location: [facility];  Service: Cardiovascular;  Laterality: N/A;  . INGUINAL HERNIA REPAIR     over 20 years ago  . LEFT HEART CATH AND CORONARY ANGIOGRAPHY N/A 01/10/2019   Procedure: LEFT HEART CATH AND CORONARY ANGIOGRAPHY;  Surgeon: Leonie Man, MD;  Location: [facility];  Service: Cardiovascular;  Laterality: N/A;  .  LUMBAR LAMINECTOMY Bilateral 04/05/2016   L2-L5   . VENTRAL HERNIA REPAIR  2018    Family History  Problem Relation Age of Onset  . Colon cancer Father   . Throat cancer Brother     Social History   Tobacco Use  . Smoking status: Former Smoker    Packs/day: 1.00    Years: 30.00    Pack years: 30.00  . Smokeless tobacco: Former Systems developer  . Tobacco comment: 01/10/2019  Vaping Use  . Vaping Use: Never used  Substance Use Topics  . Alcohol use: Yes    Alcohol/week: 6.0 standard drinks    Types: 6 Cans of beer per week    Comment: 6 pack a day  . Drug use: Never    Prior to Admission medications   Medication Sig Start Date End Date Taking? Authorizing Provider  atorvastatin (LIPITOR) 80 MG tablet Take 1 tablet (80 mg total) by mouth daily at 6 PM. 01/12/19   Belva Crome, MD  buPROPion (WELLBUTRIN XL) 300 MG 24 hr tablet Take 1 tablet (300 mg total) by mouth daily. 07/09/19   [name], Percell Miller, [name]  carvedilol (COREG) 3.125 MG tablet TAKE 1 TABLET BY MOUTH TWICE DAILY WITH MEALS 05/09/19   Park Liter, MD  Cholecalciferol (VITAMIN D-1000 MAX ST) 25 MCG (1000 UT) tablet Take 1,000 Units by mouth daily.  [provider]  clopidogrel (PLAVIX) 75 MG tablet Take 1 tablet (75 mg total) by mouth daily with breakfast. 01/13/19   Belva Crome, MD  enalapril (VASOTEC) 5 MG tablet TAKE 1 TABLET(5 MG) BY MOUTH DAILY 09/07/19   [name], Percell Miller, [name]  famotidine (PEPCID) 40 MG tablet Take 1 tablet (40 mg total) by mouth daily. 03/19/19   Park Liter, MD  fexofenadine (ALLEGRA) 180 MG tablet Take 180 mg by mouth at bedtime.  04/09/08   [provider]  finasteride (PROSCAR) 5 MG tablet TAKE 1 TABLET BY MOUTH EVERY DAY 06/14/19   [name], Percell Miller, [name]  gabapentin (NEURONTIN) 100 MG capsule TAKE 2 TO 3 CAPSULES BY MOUTH DAILY AS NEEDED FOR PAIN 09/07/19   [name], Percell Miller, [name]  nitroGLYCERIN (NITROSTAT) 0.4 MG SL tablet Place 1 tablet (0.4 mg total) under the tongue  every 5 (five) minutes as needed for chest pain. 10/11/18   Park Liter, MD  Omega-3 Fatty Acids (FISH OIL) 1000 MG CAPS Take 1,000 mg by mouth.    [provider]  pantoprazole (PROTONIX) 40 MG tablet TAKE 1 TABLET(40 MG) BY MOUTH DAILY 10/18/19   Jackquline Denmark, MD  sertraline (ZOLOFT) 100 MG tablet Take 150 mg by mouth at bedtime.  06/13/18   [provider]  solifenacin (VESICARE) 5 MG tablet Take 5 mg by mouth daily.    [provider]  tamsulosin (FLOMAX) 0.4 MG CAPS capsule TAKE 1 CAPSULE(0.4 MG) BY MOUTH DAILY 06/07/19   [name], Percell Miller, [name]  Turmeric 500 MG CAPS Take 1 capsule by mouth 2 (two) times daily.     [provider]  vitamin B-12 (CYANOCOBALAMIN) 1000 MCG tablet Take 1,000 mcg by mouth daily.    [provider]  XARELTO 20 MG TABS tablet TAKE 1 TABLET(20 MG) BY MOUTH DAILY WITH SUPPER 06/29/19   Park Liter, MD    Allergies Naproxen   REVIEW OF SYSTEMS  Negative except as noted here or in the History of Present Illness.   PHYSICAL EXAMINATION  Initial Vital Signs Blood pressure (!) 156/96, pulse 88, temperature (!) 97.5 F (36.4 C), resp. rate 21, height 5\' 11"  (1.803 m), weight 83.9 kg, SpO2 97 %.  Examination General: Well-developed, well-nourished male in no acute distress; appearance consistent with age of record HENT: normocephalic; atraumatic Eyes: pupils equal, round and reactive to light; extraocular muscles intact Neck: supple Heart: regular rate and rhythm; no murmur Lungs: clear to auscultation bilaterally Chest: Left upper chest wall tenderness which reproduces the pain of the chief complaint Abdomen: soft; nondistended; nontender; bowel sounds present Extremities: No deformity; full range of motion; pulses normal Neurologic: Awake, alert and oriented; motor function intact in all extremities and symmetric; no facial droop Skin: Warm and dry Psychiatric: Normal mood and affect   RESULTS    Summary of this visit's results, reviewed and interpreted by myself:   EKG Interpretation  Date/Time:  Friday October 19 2019 01:32:51 EDT Ventricular Rate:  85 PR Interval:    QRS Duration: 121 QT Interval:  365 QTC Calculation: 434 R Axis:   -51 Text Interpretation: Sinus rhythm Multiple ventricular premature complexes Prolonged PR interval IVCD, consider atypical RBBB PVCs not seen previously Confirmed by Yajaira Doffing, Jenny Reichmann [phone]) on 10/19/2019 1:38:18 AM      Laboratory Studies: Results for orders placed or performed during the hospital encounter of 10/19/19 (from the past 24 hour(s))  Basic metabolic panel     Status: Abnormal   Collection Time: 10/19/19  1:40 AM  Result Value Ref Range   Sodium 133 (L) 135 - 145 mmol/L   Potassium 4.1 3.5 - 5.1 mmol/L   Chloride 100 98 - 111 mmol/L   CO2 22 22 - 32 mmol/L   Glucose, Bld 104 (H) 70 - 99 mg/dL   BUN 11 8 - 23 mg/dL   Creatinine, Ser 0.75 0.61 - 1.24 mg/dL   Calcium 9.1 8.9 - 10.3 mg/dL   GFR calc non Af Amer >60 >60 mL/min   GFR calc Af Amer >60 >60 mL/min   Anion gap 11 5 - 15  CBC     Status: None   Collection Time: 10/19/19  1:40 AM  Result Value Ref Range   WBC 6.4 4.0 - 10.5 K/uL   RBC 4.29 4.22 - 5.81 MIL/uL   Hemoglobin 14.4 13.0 - 17.0 g/dL   HCT 41.9 39 - 52 %   MCV 97.7 80.0 - 100.0 fL   MCH 33.6 26.0 - 34.0 pg   MCHC 34.4 30.0 - 36.0 g/dL   RDW 12.6 11.5 - 15.5 %   Platelets 186 150 - 400 K/uL   nRBC 0.0 0.0 - 0.2 %  Troponin I (High Sensitivity)     Status: None   Collection Time: 10/19/19  1:40 AM  Result Value Ref Range   Troponin I (High Sensitivity) 10 <18 ng/L  Troponin I (High Sensitivity)     Status: None   Collection Time: 10/19/19  4:20 AM  Result Value Ref Range   Troponin I (High Sensitivity) 10 <18 ng/L   Imaging Studies: DG Chest 2 View  Result Date: 10/19/2019 CLINICAL DATA:  Chest pain EXAM: CHEST - 2 VIEW COMPARISON:  None. FINDINGS: The heart size and mediastinal contours are within  normal limits. Aortic knob calcifications are seen. A both lungs are clear. No acute osseous abnormality. IMPRESSION: No active cardiopulmonary disease. Electronically Signed   By: Prudencio Pair M.D.   On: 10/19/2019 02:00    ED COURSE and MDM  Nursing notes, initial and subsequent vitals signs, including pulse oximetry, reviewed and interpreted by myself.  Vitals:   10/19/19 0135 10/19/19 0340 10/19/19 0425  BP: (!) 156/96 109/71 (!) 123/88  Pulse: 88 91 84  Resp: 21 20 18   Temp: (!) 97.5 F (36.4 C)    SpO2: 97% 96% 95%  Weight: 83.9 kg    Height: 5\' 11"  (1.803 m)     Medications  sucralfate (CARAFATE) 1 GM/10ML suspension 1 g (1 g Oral Given 10/19/19 0415)  ketorolac (TORADOL) 15 MG/ML injection 15 mg (15 mg Intravenous Given 10/19/19 0418)  ondansetron (ZOFRAN) injection 4 mg (4 mg Intravenous Given 10/19/19 0417)  pantoprazole (PROTONIX) injection 40 mg (40 mg Intravenous Given 10/19/19 0419)   4:54 AM Patient got significant relief with IV Toradol. He still has some tenderness to palpation of the left upper chest. He has had 2 normal troponins without change. His EKG is not significantly changed from previous EKG. The risks and benefits of admission versus discharge were discussed, in the context of his HEART score, and he will would prefer to go home and follow-up with his cardiologist. He has a follow-up appointment already scheduled. He was advised to return for worsening symptoms as he does have known coronary artery disease.  PROCEDURES  Procedures   ED DIAGNOSES     ICD-10-CM   1. Chest wall pain  R07.89        Shanon Rosser, MD 10/19/19 [phone]

## 2019-10-19 NOTE — ED Triage Notes (Signed)
Pt reports chest pain since approx 2330. SL nitro x1 and maalox not effective for pain. Hx MI in October.

## 2019-10-22 ENCOUNTER — Encounter: Payer: Self-pay | Admitting: Cardiology

## 2019-10-22 ENCOUNTER — Other Ambulatory Visit: Payer: Self-pay | Admitting: Medical

## 2019-10-22 ENCOUNTER — Ambulatory Visit (INDEPENDENT_AMBULATORY_CARE_PROVIDER_SITE_OTHER): Payer: Medicare Other | Admitting: Cardiology

## 2019-10-22 ENCOUNTER — Other Ambulatory Visit: Payer: Self-pay

## 2019-10-22 VITALS — BP 108/72 | HR 95 | Ht 71.0 in | Wt 188.0 lb

## 2019-10-22 DIAGNOSIS — I208 Other forms of angina pectoris: Secondary | ICD-10-CM

## 2019-10-22 DIAGNOSIS — E7800 Pure hypercholesterolemia, unspecified: Secondary | ICD-10-CM | POA: Diagnosis not present

## 2019-10-22 DIAGNOSIS — I2510 Atherosclerotic heart disease of native coronary artery without angina pectoris: Secondary | ICD-10-CM | POA: Diagnosis not present

## 2019-10-22 DIAGNOSIS — Z9889 Other specified postprocedural states: Secondary | ICD-10-CM

## 2019-10-22 DIAGNOSIS — I10 Essential (primary) hypertension: Secondary | ICD-10-CM

## 2019-10-22 DIAGNOSIS — E785 Hyperlipidemia, unspecified: Secondary | ICD-10-CM | POA: Diagnosis not present

## 2019-10-22 DIAGNOSIS — Z8679 Personal history of other diseases of the circulatory system: Secondary | ICD-10-CM

## 2019-10-22 MED ORDER — RANOLAZINE ER 1000 MG PO TB12: 1000.0000 mg | ORAL_TABLET | Freq: Two times a day (BID) | ORAL | 3 refills | Status: DC

## 2019-10-22 NOTE — Patient Instructions (Signed)
Medication Instructions:  Your physician has recommended you make the following change in your medication:   Start 500 mg Ranexa twice daily and increase to 1 gm twice daily.  *If you need a refill on your cardiac medications before your next appointment, please call your pharmacy*   Lab Work: None ordered If you have labs (blood work) drawn today and your tests are completely normal, you will receive your results only by: Marland Kitchen MyChart Message (if you have MyChart) OR . A paper copy in the mail If you have any lab test that is abnormal or we need to change your treatment, we will call you to review the results.   Testing/Procedures: Your physician has requested that you have a lexiscan myoview. For further information please visit [email or web address]. Please follow instruction sheet, as given.  The test will take approximately 3 to 4 hours to complete; you may bring reading material.  If someone comes with you to your appointment, they will need to remain in the main lobby due to limited space in the testing area. **If you are pregnant or breastfeeding, please notify the nuclear lab prior to your appointment**  How to prepare for your Myocardial Perfusion Test: . Do not eat or drink 3 hours prior to your test, except you may have water. . Do not consume products containing caffeine (regular or decaffeinated) 12 hours prior to your test. (ex: coffee, chocolate, sodas, tea). . Do bring a list of your current medications with you.  If not listed below, you may take your medications as normal. . Do wear comfortable clothes (no dresses or overalls) and walking shoes, tennis shoes preferred (No heels or open toe shoes are allowed). . Do NOT wear cologne, perfume, aftershave, or lotions (deodorant is allowed). . If these instructions are not followed, your test will have to be rescheduled.    Follow-Up: At [facility], you and your health needs are our priority.  As part of our continuing  mission to provide you with exceptional heart care, we have created designated Provider Care Teams.  These Care Teams include your primary Cardiologist (physician) and Advanced Practice Providers (APPs -  Physician Assistants and Nurse Practitioners) who all work together to provide you with the care you need, when you need it.  We recommend signing up for the patient portal called "MyChart".  Sign up information is provided on this After Visit Summary.  MyChart is used to connect with patients for Virtual Visits (Telemedicine).  Patients are able to view lab/test results, encounter notes, upcoming appointments, etc.  Non-urgent messages can be sent to your provider as well.   To learn more about what you can do with MyChart, go to [email or web address].    Your next appointment:   3-4 week(s)  The format for your next appointment:   In Person  Provider:   Jenne Campus, MD   Other Instructions  Nuclear Medicine Exam A nuclear medicine exam is a safe and painless imaging test. It helps your health care provider detect and diagnose diseases. It also provides information about the ways your organs work and how they are structured. For a nuclear medicine exam, you will be given a radioactive tracer. This substance is absorbed by your body's organs. A large scanning machine detects the tracer and creates pictures of the areas that your health care provider wants to know more about. There are several kinds of nuclear medicine exams. They include the following:  CT scan.  MRI scan.  PET scan.  SPECT scan. Tell your health care provider about:  Any allergies you have.  All medicines you are taking, including vitamins, herbs, eye drops, creams, and over-the-counter medicines.  Any problems you or family members have had with anesthetic medicines.  Any blood disorders you have.  Any surgeries you have had.  Any medical conditions you have.  Whether you are pregnant or may be  pregnant.  Whether you are nursing. What are the risks? Generally, this is a safe procedure. However, problems may occur, such as an allergic reaction to the tracer, but this is rare. What happens before the procedure? Medicines Ask your health care provider about:  Changing or stopping your regular medicines. This is especially important if you are taking diabetes medicines or blood thinners.  Taking medicines such as aspirin and ibuprofen. These medicines can thin your blood. Do not take these medicines unless your health care provider tells you to take them.  Taking over-the-counter medicines, vitamins, herbs, and supplements. General instructions  Follow instructions from your health care provider about eating or drinking restrictions.  Do not wear jewelry.  Wear loose, comfortable clothing. You may be asked to wear a hospital gown for the procedure.  Bring previous imaging studies, such as X-rays, with you to the exam if they are available. What happens during the procedure?   An IV may be inserted into one of your veins.  You will be asked to lie on a table or sit in a chair.  You will be given the radioactive tracer. You may get: ? A pill or liquid to swallow. ? An injection. ? Medicine through your IV. ? A gas to inhale.  A large scanning machine will be used to create images of your body. After the pictures are taken, you may have to wait so your health care provider can make sure that enough images were taken. The procedure may vary among health care providers and hospitals. What happens after the procedure?  You may go home after the procedure and return to your usual activities, unless your health care provider tells you otherwise.  Drink enough water to keep your urine pale yellow. This helps to remove the radioactive tracer from your body.  It is up to you to get the results of your procedure. Ask your health care provider, or the department that is doing the  procedure, when your results will be ready.  Get help right away if you have problems breathing. Summary  A nuclear medicine exam is a safe and painless imaging test that provides information about how your organs are working. It is also used to detect and diagnose diseases of various body organs.  Follow your health care provider's instructions about eating and drinking restrictions. Ask whether you should change or stop any medicines.  During the procedure, you will be given a radioactive tracer. A large scanning machine will create images of your body.  You may go home after the procedure and return to your regular activities. Follow your health care provider's instructions.  Get help right away if you have problems breathing. This information is not intended to replace advice given to you by your health care provider. Make sure you discuss any questions you have with your health care provider. Document Revised: 01/25/2018 Document Reviewed: 01/25/2018 Elsevier Patient Education  [place]. Ranolazine tablets, extended release What is this medicine? RANOLAZINE (ra NOE la zeen) is a heart medicine. It is used to treat chronic chest pain (angina). This medicine must  be taken regularly. It will not relieve an acute episode of chest pain. This medicine may be used for other purposes; ask your health care provider or pharmacist if you have questions. COMMON BRAND NAME(S): Ranexa What should I tell my health care provider before I take this medicine? They need to know if you have any of these conditions:  heart disease  irregular heartbeat  kidney disease  liver disease  low levels of potassium or magnesium in the blood  an unusual or allergic reaction to ranolazine, other medicines, foods, dyes, or preservatives  pregnant or trying to get pregnant  breast-feeding How should I use this medicine? Take this medicine by mouth with a glass of water. Follow the directions on  the prescription label. Do not cut, crush, or chew this medicine. Take with or without food. Do not take this medication with grapefruit juice. Take your doses at regular intervals. Do not take your medicine more often then directed. Talk to your pediatrician regarding the use of this medicine in children. Special care may be needed. Overdosage: If you think you have taken too much of this medicine contact a poison control center or emergency room at once. NOTE: This medicine is only for you. Do not share this medicine with others. What if I miss a dose? If you miss a dose, take it as soon as you can. If it is almost time for your next dose, take only that dose. Do not take double or extra doses. What may interact with this medicine? Do not take this medicine with any of the following medications:  antivirals for HIV or AIDS  cerivastatin  certain antibiotics like chloramphenicol, clarithromycin, dalfopristin; quinupristin, isoniazid, rifabutin, rifampin, rifapentine  certain medicines used for cancer like imatinib, nilotinib  certain medicines for fungal infections like fluconazole, itraconazole, ketoconazole, posaconazole, voriconazole  certain medicines for irregular heart beat like dronedarone  certain medicines for seizures like carbamazepine, fosphenytoin, oxcarbazepine, phenobarbital, phenytoin  cisapride  conivaptan  cyclosporine  grapefruit or grapefruit juice  lumacaftor; ivacaftor  nefazodone  pimozide  quinacrine  St John's wort  thioridazine This medicine may also interact with the following medications:  alfuzosin  certain medicines for depression, anxiety, or psychotic disturbances like bupropion, citalopram, fluoxetine, fluphenazine, paroxetine, perphenazine, risperidone, sertraline, trifluoperazine  certain medicines for cholesterol like atorvastatin, lovastatin, simvastatin  certain medicines for stomach problems like octreotide, palonosetron,  prochlorperazine  eplerenone  ergot alkaloids like dihydroergotamine, ergonovine, ergotamine, methylergonovine  metformin  nicardipine  other medicines that prolong the QT interval (cause an abnormal heart rhythm) like dofetilide, ziprasidone  sirolimus  tacrolimus This list may not describe all possible interactions. Give your health care provider a list of all the medicines, herbs, non-prescription drugs, or dietary supplements you use. Also tell them if you smoke, drink alcohol, or use illegal drugs. Some items may interact with your medicine. What should I watch for while using this medicine? Visit your doctor for regular check ups. Tell your doctor or healthcare professional if your symptoms do not start to get better or if they get worse. This medicine will not relieve an acute attack of angina or chest pain. This medicine can change your heart rhythm. Your health care provider may check your heart rhythm by ordering an electrocardiogram (ECG) while you are taking this medicine. You may get drowsy or dizzy. Do not drive, use machinery, or do anything that needs mental alertness until you know how this medicine affects you. Do not stand or sit up quickly, especially  if you are an older patient. This reduces the risk of dizzy or fainting spells. Alcohol may interfere with the effect of this medicine. Avoid alcoholic drinks. If you are scheduled for any medical or dental procedure, tell your healthcare provider that you are taking this medicine. This medicine can interact with other medicines used during surgery. What side effects may I notice from receiving this medicine? Side effects that you should report to your doctor or health care professional as soon as possible:  allergic reactions like skin rash, itching or hives, swelling of the face, lips, or tongue  breathing problems  changes in vision  fast, irregular or pounding heartbeat  feeling faint or lightheaded, falls  low  or high blood pressure  numbness or tingling feelings  ringing in the ears  tremor or shakiness  slow heartbeat (fewer than 50 beats per minute)  swelling of the legs or feet Side effects that usually do not require medical attention (report to your doctor or health care professional if they continue or are bothersome):  constipation  drowsy  dry mouth  headache  nausea or vomiting  stomach upset This list may not describe all possible side effects. Call your doctor for medical advice about side effects. You may report side effects to FDA at 1-800-FDA-1088. Where should I keep my medicine? Keep out of the reach of children. Store at room temperature between 15 and 30 degrees C (59 and 86 degrees F). Throw away any unused medicine after the expiration date. NOTE: This sheet is a summary. It may not cover all possible information. If you have questions about this medicine, talk to your doctor, pharmacist, or health care provider.  2020 Elsevier/Gold Standard (2018-02-28 09:18:49)

## 2019-10-22 NOTE — Progress Notes (Signed)
Cardiology Office Note:    Date:  10/22/2019   ID:  Robert Lang, DOB Dec 19, 1950, MRN [id number]  PCP:  Mackie Pai, [name]  Cardiologist:  Jenean Lindau, MD   Referring MD: Mackie Pai, [name]    ASSESSMENT:    1. Coronary artery disease involving native coronary artery of native heart without angina pectoris   2. Dyslipidemia, goal LDL below 70   3. Essential hypertension   4. Pure hypercholesterolemia   5. Status post ablation of atrial flutter    PLAN:    In order of problems listed above:  1. Chest discomfort: Coronary artery disease: I discussed the findings with the patient extensively.  His symptoms are of concern.  I discussed coronary angiography report.  I will set him up for a Lexiscan sestamibi and is agreeable.  We will also initiate him on Ranexa 500 mg twice daily for 2 weeks then 1 g twice daily.  He will see my partner in the next 3 to 4 weeks.  He knows to go to nearest emergency room for any concerning symptoms.  Importance of regular walking stressed.  I told him to start walking in a graded manner and is agreeable.  With ambulation and activity recently has not been having any symptoms. 2. Essential hypertension: Blood pressure stable 3. Mixed dyslipidemia: Diet was emphasized and the importance of regular exercise stressed he promises to do better. 4. Former smoker: I counseled him never to go back to smoking.  Patient and wife had multiple questions which were answered to their satisfaction.   Medication Adjustments/Labs and Tests Ordered: Current medicines are reviewed at length with the patient today.  Concerns regarding medicines are outlined above.  No orders of the defined types were placed in this encounter.  No orders of the defined types were placed in this encounter.    No chief complaint on file.    History of Present Illness:    Robert Lang is a 69 y.o. male.  Patient sees my partner Dr. Agustin Cree.  He has known coronary  artery disease post stenting as mentioned below.  He went to the emergency room for chest discomfort.  He was treated and released.  Subsequently is done fine.  His chest discomfort is better.  He mentions to me that the symptoms were like before his heart attack.  And they were very similar.  He leads a very sedentary lifestyle.  He is an ex-smoker.  He has history of hypertension dyslipidemia.  At the time of my evaluation, the patient is alert awake oriented and in no distress.  His wife accompanies him for the visit.  Past Medical History:  Diagnosis Date  . Allergy   . Anxiety   . Arthritis   . Atrial fibrillation ([place])   . CAD (coronary artery disease)   . COPD (chronic obstructive pulmonary disease) ([place])   . Depression   . Depression   . ETOH abuse 01/11/2019   6 pack of beer per day  . GERD (gastroesophageal reflux disease)   . Heart attack ([place])   . History of colon polyps   . Hyperlipidemia   . Hypertension   . Kidney stones   . Smoker 01/11/2019    Past Surgical History:  Procedure Laterality Date  . ATRIAL FIBRILLATION ABLATION  10/2015  . BACK SURGERY    . CARDIOVERSION  2017  . CATARACT EXTRACTION Bilateral 2017   March and April 2017  . COLONOSCOPY  2018  . CORONARY STENT PLACEMENT  2012  . CORONARY/GRAFT ACUTE MI REVASCULARIZATION N/A 01/10/2019   Procedure: CORONARY/GRAFT ACUTE MI REVASCULARIZATION;  Surgeon: Leonie Man, MD;  Location: [place] [facility];  Service: Cardiovascular;  Laterality: N/A;  . INGUINAL HERNIA REPAIR     over 20 years ago  . LEFT HEART CATH AND CORONARY ANGIOGRAPHY N/A 01/10/2019   Procedure: LEFT HEART CATH AND CORONARY ANGIOGRAPHY;  Surgeon: Leonie Man, MD;  Location: [place] [facility];  Service: Cardiovascular;  Laterality: N/A;  . LUMBAR LAMINECTOMY Bilateral 04/05/2016   L2-L5   . VENTRAL HERNIA REPAIR  2018    Current Medications: Current Meds  Medication Sig  . atorvastatin (LIPITOR) 80 MG tablet Take 1 tablet  (80 mg total) by mouth daily at 6 PM.  . buPROPion (WELLBUTRIN XL) 300 MG 24 hr tablet Take 1 tablet (300 mg total) by mouth daily.  . carvedilol (COREG) 3.125 MG tablet TAKE 1 TABLET BY MOUTH TWICE DAILY WITH MEALS  . Cholecalciferol (VITAMIN D-1000 MAX ST) 25 MCG (1000 UT) tablet Take 1,000 Units by mouth daily.   . clopidogrel (PLAVIX) 75 MG tablet Take 1 tablet (75 mg total) by mouth daily with breakfast.  . enalapril (VASOTEC) 5 MG tablet TAKE 1 TABLET(5 MG) BY MOUTH DAILY  . famotidine (PEPCID) 40 MG tablet Take 1 tablet (40 mg total) by mouth daily.  . fexofenadine (ALLEGRA) 180 MG tablet Take 180 mg by mouth at bedtime.   . finasteride (PROSCAR) 5 MG tablet TAKE 1 TABLET BY MOUTH EVERY DAY  . nitroGLYCERIN (NITROSTAT) 0.4 MG SL tablet Place 1 tablet (0.4 mg total) under the tongue every 5 (five) minutes as needed for chest pain.  . Omega-3 Fatty Acids (FISH OIL) 1000 MG CAPS Take 1,000 mg by mouth.  . pantoprazole (PROTONIX) 40 MG tablet TAKE 1 TABLET(40 MG) BY MOUTH DAILY  . sertraline (ZOLOFT) 100 MG tablet Take 150 mg by mouth at bedtime.   . solifenacin (VESICARE) 5 MG tablet Take 5 mg by mouth daily.  . tamsulosin (FLOMAX) 0.4 MG CAPS capsule TAKE 1 CAPSULE(0.4 MG) BY MOUTH DAILY  . Turmeric 500 MG CAPS Take 1 capsule by mouth 2 (two) times daily.   . vitamin B-12 (CYANOCOBALAMIN) 1000 MCG tablet Take 1,000 mcg by mouth daily.  Alveda Reasons 20 MG TABS tablet TAKE 1 TABLET(20 MG) BY MOUTH DAILY WITH SUPPER  . [DISCONTINUED] gabapentin (NEURONTIN) 100 MG capsule TAKE 2 TO 3 CAPSULES BY MOUTH DAILY AS NEEDED FOR PAIN     Allergies:   Naproxen   Social History   Socioeconomic History  . Marital status: Married    Spouse name: Not on file  . Number of children: Not on file  . Years of education: Not on file  . Highest education level: Not on file  Occupational History  . Not on file  Tobacco Use  . Smoking status: Former Smoker    Packs/day: 1.00    Years: 30.00    Pack  years: 30.00  . Smokeless tobacco: Former Systems developer  . Tobacco comment: 01/10/2019  Vaping Use  . Vaping Use: Never used  Substance and Sexual Activity  . Alcohol use: Yes    Alcohol/week: 6.0 standard drinks    Types: 6 Cans of beer per week    Comment: 6 pack a day  . Drug use: Never  . Sexual activity: Not on file  Other Topics Concern  . Not on file  Social History Narrative  . Not on file  Social Determinants of Health   Financial Resource Strain:   . Difficulty of Paying Living Expenses:   Food Insecurity:   . Worried About Charity fundraiser in the Last Year:   . Arboriculturist in the Last Year:   Transportation Needs: No Transportation Needs  . Lack of Transportation (Medical): No  . Lack of Transportation (Non-Medical): No  Physical Activity:   . Days of Exercise per Week:   . Minutes of Exercise per Session:   Stress:   . Feeling of Stress :   Social Connections:   . Frequency of Communication with Friends and Family:   . Frequency of Social Gatherings with Friends and Family:   . Attends Religious Services:   . Active Member of Clubs or Organizations:   . Attends Archivist Meetings:   Marland Kitchen Marital Status:      Family History: The patient's family history includes Colon cancer in his father; Throat cancer in his brother.  ROS:   Please see the history of present illness.    All other systems reviewed and are negative.  EKGs/Labs/Other Studies Reviewed:    The following studies were reviewed today: Leonie Man, MD (Primary)    Procedures  CORONARY/GRAFT ACUTE MI REVASCULARIZATION  LEFT HEART CATH AND CORONARY ANGIOGRAPHY  Conclusion    CULPRIT lesion prox RCA lesion is 100% stenosed.  A drug-eluting stent (stent 2) was successfully placed to overlap stent 1 and cover the culprit lesion, using a Maybeury [id number]. Postdilated 2.9 mm  Post intervention, there is a 0% residual stenosis.  Just distal to culprit lesion: Prox  RCA to Mid RCA lesion is 70% stenosed. (Noted after initial angioplasty of the 100% stenosis))  Post intervention, there is a 0% residual stenosis.  A drug-eluting stent (stent 1) was successfully placed covering the distal portion of the lesion segment and landing just distal to the culprit lesion, using a STENT RESOLUTE ONYX 2.5X34. Postdilated to 2.6 mm distally and 2.9 mm proximally at the overlap  --------------------------------  Prox LAD to Mid LAD lesion is 50% stenosed -just prior to beginning of Previously placed Mid LAD to Dist LAD stent (unknown type) is widely patent.  Small caliber, jailed 2nd Diag vessel has ostial 90% stenosis as previously described.  There is no aortic valve stenosis.   SUMMARY  Significant two-vessel disease with culprit lesion being 100% occluded proximal-mid RCA with widely patent mid LAD stent preceded by a long segment of 40 to 50% stenosis.  Codominant system  Normal LVEDP--LV gram not done because of radial artery spasm   RECOMMENDATIONS  Admit for monitoring in the CVICU tonight anticipate transfer to the telemetry floor tomorrow with potential fast-track discharge by Friday 10/23  I have increased his atorvastatin to 80 mg daily  He is on diltiazem for rate control, if EF is still preserved, would be okay to continue with diltiazem otherwise would switch to beta-blocker.    Recent Labs: 04/20/2019: ALT 28 10/19/2019: BUN 11; Creatinine, Ser 0.75; Hemoglobin 14.4; Platelets 186; Potassium 4.1; Sodium 133  Recent Lipid Panel    Component Value Date/Time   CHOL 119 04/20/2019 0807   TRIG 121 04/20/2019 0807   HDL 46 04/20/2019 0807   CHOLHDL 2.6 04/20/2019 0807   CHOLHDL 3.4 01/10/2019 1328   VLDL 43 (H) 01/10/2019 1328   LDLCALC 51 04/20/2019 0807   LDLDIRECT 66.0 09/06/2018 0924    Physical Exam:    VS:  BP 108/72  Pulse 95   Ht 5\' 11"  (1.803 m)   Wt 188 lb (85.3 kg)   SpO2 94%   BMI 26.22 kg/m     Wt Readings from  Last 3 Encounters:  10/22/19 188 lb (85.3 kg)  10/19/19 185 lb (83.9 kg)  04/19/19 184 lb (83.5 kg)     GEN: Patient is in no acute distress HEENT: Normal NECK: No JVD; No carotid bruits LYMPHATICS: No lymphadenopathy CARDIAC: Hear sounds regular, 2/6 systolic murmur at the apex. RESPIRATORY:  Clear to auscultation without rales, wheezing or rhonchi  ABDOMEN: Soft, non-tender, non-distended MUSCULOSKELETAL:  No edema; No deformity  SKIN: Warm and dry NEUROLOGIC:  Alert and oriented x 3 PSYCHIATRIC:  Normal affect   Signed, Jenean Lindau, MD  10/22/2019 1:42 PM    [facility]

## 2019-10-23 ENCOUNTER — Telehealth (HOSPITAL_COMMUNITY): Payer: Self-pay | Admitting: *Deleted

## 2019-10-23 NOTE — Telephone Encounter (Signed)
Patient given detailed instructions per Myocardial Perfusion Study Information Sheet for the test on 10/24/2019 at 0800. Patient notified to arrive 15 minutes early and that it is imperative to arrive on time for appointment to keep from having the test rescheduled.  If you need to cancel or reschedule your appointment, please call the office within 24 hours of your appointment. . Patient verbalized understanding.Robert Lang, Ranae Palms Patient did not want instructions letter sent by mychart.

## 2019-10-24 ENCOUNTER — Other Ambulatory Visit: Payer: Self-pay

## 2019-10-24 ENCOUNTER — Ambulatory Visit (HOSPITAL_COMMUNITY): Payer: Medicare Other | Attending: Internal Medicine

## 2019-10-24 DIAGNOSIS — I2089 Other forms of angina pectoris: Secondary | ICD-10-CM

## 2019-10-24 DIAGNOSIS — Z9889 Other specified postprocedural states: Secondary | ICD-10-CM | POA: Diagnosis present

## 2019-10-24 DIAGNOSIS — I208 Other forms of angina pectoris: Secondary | ICD-10-CM | POA: Diagnosis present

## 2019-10-24 DIAGNOSIS — I2510 Atherosclerotic heart disease of native coronary artery without angina pectoris: Secondary | ICD-10-CM

## 2019-10-24 DIAGNOSIS — I10 Essential (primary) hypertension: Secondary | ICD-10-CM

## 2019-10-24 DIAGNOSIS — E7800 Pure hypercholesterolemia, unspecified: Secondary | ICD-10-CM | POA: Diagnosis present

## 2019-10-24 DIAGNOSIS — Z8679 Personal history of other diseases of the circulatory system: Secondary | ICD-10-CM | POA: Diagnosis present

## 2019-10-24 LAB — MYOCARDIAL PERFUSION IMAGING
LV dias vol: 59 mL (ref 62–150)
LV sys vol: 21 mL
Peak HR: 94 {beats}/min
Rest HR: 83 {beats}/min
SDS: 2
SRS: 0
SSS: 2
TID: 0.82

## 2019-10-24 MED ORDER — TECHNETIUM TC 99M TETROFOSMIN IV KIT
10.2000 | PACK | Freq: Once | INTRAVENOUS | Status: AC | PRN
  Administered 2019-10-24: 10.2 via INTRAVENOUS
  Filled 2019-10-24: qty 11

## 2019-10-24 MED ORDER — REGADENOSON 0.4 MG/5ML IV SOLN
0.4000 mg | Freq: Once | INTRAVENOUS | Status: AC
  Administered 2019-10-24: 0.4 mg via INTRAVENOUS

## 2019-10-24 MED ORDER — TECHNETIUM TC 99M TETROFOSMIN IV KIT
30.4000 | PACK | Freq: Once | INTRAVENOUS | Status: AC | PRN
  Administered 2019-10-24: 30.4 via INTRAVENOUS
  Filled 2019-10-24: qty 31

## 2019-10-26 ENCOUNTER — Other Ambulatory Visit: Payer: Self-pay

## 2019-10-26 ENCOUNTER — Encounter: Payer: Self-pay | Admitting: Cardiology

## 2019-10-26 ENCOUNTER — Ambulatory Visit (INDEPENDENT_AMBULATORY_CARE_PROVIDER_SITE_OTHER): Payer: Medicare Other | Admitting: Cardiology

## 2019-10-26 VITALS — BP 100/60 | HR 90 | Ht 71.0 in | Wt 189.1 lb

## 2019-10-26 DIAGNOSIS — I2510 Atherosclerotic heart disease of native coronary artery without angina pectoris: Secondary | ICD-10-CM | POA: Diagnosis not present

## 2019-10-26 DIAGNOSIS — E785 Hyperlipidemia, unspecified: Secondary | ICD-10-CM | POA: Diagnosis not present

## 2019-10-26 DIAGNOSIS — I10 Essential (primary) hypertension: Secondary | ICD-10-CM

## 2019-10-26 DIAGNOSIS — I483 Typical atrial flutter: Secondary | ICD-10-CM | POA: Diagnosis not present

## 2019-10-26 DIAGNOSIS — E7800 Pure hypercholesterolemia, unspecified: Secondary | ICD-10-CM

## 2019-10-26 MED ORDER — CARVEDILOL 3.125 MG PO TABS: 3.1250 mg | ORAL_TABLET | Freq: Two times a day (BID) | ORAL | 3 refills | Status: DC

## 2019-10-26 MED ORDER — RIVAROXABAN 20 MG PO TABS: ORAL_TABLET | ORAL | 5 refills | Status: DC

## 2019-10-26 MED ORDER — ATORVASTATIN CALCIUM 80 MG PO TABS: 80.0000 mg | ORAL_TABLET | Freq: Every day | ORAL | 11 refills | Status: DC

## 2019-10-26 MED ORDER — ENALAPRIL MALEATE 5 MG PO TABS: 5.0000 mg | ORAL_TABLET | Freq: Every day | ORAL | 1 refills | Status: DC

## 2019-10-26 NOTE — Progress Notes (Signed)
Cardiology Office Note:    Date:  10/26/2019   ID:  Robert Lang, DOB Jul 31, 1950, MRN [id number]  PCP:  Robert Pai, [name]  Cardiologist:  Robert Campus, MD    Referring MD: Robert Lang   No chief complaint on file. I am doing fine  History of Present Illness:    Robert Lang is a 69 y.o. male   with a hx of CAD s/p PCI to LAD/RCA, atrial flutter s/p ablation 2017, HLD, HTNlast seen while hospitalized.  LHC at [facility] 2017 with patent stent in prox LAD, 90% occlusion in small first diagonal artery and 25% occlusion of RCA w/ good EF 60%. Atrial flutter ablation at [facility] in 2017. Stress test 10/30/18 with no perfusion defect described as low risk study.   Presented to [facility] ED 01/10/19 with chest pain, code STEMI called, transferred to [facility]. Troponin peaked at [phone]. Echo preserved LV function. Underwent cardiac cath 01/10/19 with PCI and DES to RCA. Recommended for triple therapy for 1 month then stop aspirin. 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 His diltiazem was switched to coreg while hospitalized. He was find to have some chest pain few weeks ago.  After that stress test done on 10/24/2019 showing no evidence of ischemia.  Comes today 2 months of follow-up.  Overall doing fine.  He started exercising on the regular basis.  He walks and he has no difficulty doing it.  Denies have any chest pain tightness squeezing pressure burning chest while walking on shortness of breath.   Past Medical History:  Diagnosis Date  . Allergy   . Anxiety   . Arthritis   . Atrial fibrillation ([place])   . CAD (coronary artery disease)   . COPD (chronic obstructive pulmonary disease) ([place])   . Depression   . Depression   . ETOH abuse 01/11/2019   6 pack of beer per day  . GERD (gastroesophageal reflux disease)   . Heart attack ([place])   . History of colon  polyps   . Hyperlipidemia   . Hypertension   . Kidney stones   . Smoker 01/11/2019    Past Surgical History:  Procedure Laterality Date  . ATRIAL FIBRILLATION ABLATION  10/2015  . BACK SURGERY    . CARDIOVERSION  2017  . CATARACT EXTRACTION Bilateral 2017   March and April 2017  . COLONOSCOPY  2018  . CORONARY STENT PLACEMENT  2012  . CORONARY/GRAFT ACUTE MI REVASCULARIZATION N/A 01/10/2019   Procedure: CORONARY/GRAFT ACUTE MI REVASCULARIZATION;  Surgeon: Leonie Man, MD;  Location: [place] [facility];  Service: Cardiovascular;  Laterality: N/A;  . INGUINAL HERNIA REPAIR     over 20 years ago  . LEFT HEART CATH AND CORONARY ANGIOGRAPHY N/A 01/10/2019   Procedure: LEFT HEART CATH AND CORONARY ANGIOGRAPHY;  Surgeon: Leonie Man, MD;  Location: [place] [facility];  Service: Cardiovascular;  Laterality: N/A;  . LUMBAR LAMINECTOMY Bilateral 04/05/2016   L2-L5   . VENTRAL HERNIA REPAIR  2018    Current Medications: Current Meds  Medication Sig  . atorvastatin (LIPITOR) 80 MG tablet Take 1 tablet (80 mg total) by mouth daily at 6 PM.  . buPROPion (WELLBUTRIN XL) 300 MG 24 hr tablet Take 1 tablet (300 mg total) by mouth daily.  . carvedilol (COREG) 3.125 MG tablet TAKE 1 TABLET BY MOUTH TWICE DAILY WITH MEALS  . Cholecalciferol (VITAMIN D-1000 MAX  ST) 25 MCG (1000 UT) tablet Take 1,000 Units by mouth daily.   . clopidogrel (PLAVIX) 75 MG tablet Take 1 tablet (75 mg total) by mouth daily with breakfast.  . enalapril (VASOTEC) 5 MG tablet TAKE 1 TABLET(5 MG) BY MOUTH DAILY  . famotidine (PEPCID) 40 MG tablet Take 1 tablet (40 mg total) by mouth daily.  . fexofenadine (ALLEGRA) 180 MG tablet Take 180 mg by mouth at bedtime.   . finasteride (PROSCAR) 5 MG tablet TAKE 1 TABLET BY MOUTH EVERY DAY  . gabapentin (NEURONTIN) 100 MG capsule TAKE 2 TO 3 CAPSULES BY MOUTH DAILY AS NEEDED FOR PAIN  . nitroGLYCERIN (NITROSTAT) 0.4 MG SL tablet Place 1 tablet (0.4 mg total) under the  tongue every 5 (five) minutes as needed for chest pain.  . Omega-3 Fatty Acids (FISH OIL) 1000 MG CAPS Take 1,000 mg by mouth.  . pantoprazole (PROTONIX) 40 MG tablet TAKE 1 TABLET(40 MG) BY MOUTH DAILY  . ranolazine (RANEXA) 1000 MG SR tablet Take 1 tablet (1,000 mg total) by mouth 2 (two) times daily. Take 500 mg (1/2 tablet) for 2 weeks then increase to 1000 mg (1 tablet) twice daily.  . sertraline (ZOLOFT) 100 MG tablet Take 150 mg by mouth at bedtime.   . solifenacin (VESICARE) 5 MG tablet Take 5 mg by mouth daily.  . tamsulosin (FLOMAX) 0.4 MG CAPS capsule TAKE 1 CAPSULE(0.4 MG) BY MOUTH DAILY  . Turmeric 500 MG CAPS Take 1 capsule by mouth 2 (two) times daily.   . vitamin B-12 (CYANOCOBALAMIN) 1000 MCG tablet Take 1,000 mcg by mouth daily.  Alveda Reasons 20 MG TABS tablet TAKE 1 TABLET(20 MG) BY MOUTH DAILY WITH SUPPER     Allergies:   Naproxen   Social History   Socioeconomic History  . Marital status: Married    Spouse name: Not on file  . Number of children: Not on file  . Years of education: Not on file  . Highest education level: Not on file  Occupational History  . Not on file  Tobacco Use  . Smoking status: Former Smoker    Packs/day: 1.00    Years: 30.00    Pack years: 30.00  . Smokeless tobacco: Former Systems developer  . Tobacco comment: 01/10/2019  Vaping Use  . Vaping Use: Never used  Substance and Sexual Activity  . Alcohol use: Yes    Alcohol/week: 6.0 standard drinks    Types: 6 Cans of beer per week    Comment: 6 pack a day  . Drug use: Never  . Sexual activity: Not on file  Other Topics Concern  . Not on file  Social History Narrative  . Not on file   Social Determinants of Health   Financial Resource Strain:   . Difficulty of Paying Living Expenses:   Food Insecurity:   . Worried About Charity fundraiser in the Last Year:   . Arboriculturist in the Last Year:   Transportation Needs: No Transportation Needs  . Lack of Transportation (Medical): No  . Lack  of Transportation (Non-Medical): No  Physical Activity:   . Days of Exercise per Week:   . Minutes of Exercise per Session:   Stress:   . Feeling of Stress :   Social Connections:   . Frequency of Communication with Friends and Family:   . Frequency of Social Gatherings with Friends and Family:   . Attends Religious Services:   . Active Member of Clubs or Organizations:   .  Attends Archivist Meetings:   Marland Kitchen Marital Status:      Family History: The patient's family history includes Colon cancer in his father; Throat cancer in his brother. ROS:   Please see the history of present illness.    All 14 point review of systems negative except as described per history of present illness  EKGs/Labs/Other Studies Reviewed:      Recent Labs: 04/20/2019: ALT 28 10/19/2019: BUN 11; Creatinine, Ser 0.75; Hemoglobin 14.4; Platelets 186; Potassium 4.1; Sodium 133  Recent Lipid Panel    Component Value Date/Time   CHOL 119 04/20/2019 0807   TRIG 121 04/20/2019 0807   HDL 46 04/20/2019 0807   CHOLHDL 2.6 04/20/2019 0807   CHOLHDL 3.4 01/10/2019 1328   VLDL 43 (H) 01/10/2019 1328   LDLCALC 51 04/20/2019 0807   LDLDIRECT 66.0 09/06/2018 0924    Physical Exam:    VS:  BP 100/60   Pulse 90   Ht 5\' 11"  (1.803 m)   Wt 189 lb 1.3 oz (85.8 kg)   SpO2 95%   BMI 26.37 kg/m     Wt Readings from Last 3 Encounters:  10/26/19 189 lb 1.3 oz (85.8 kg)  10/24/19 188 lb (85.3 kg)  10/22/19 188 lb (85.3 kg)     GEN:  Well nourished, well developed in no acute distress HEENT: Normal NECK: No JVD; No carotid bruits LYMPHATICS: No lymphadenopathy CARDIAC: RRR, no murmurs, no rubs, no gallops RESPIRATORY:  Clear to auscultation without rales, wheezing or rhonchi  ABDOMEN: Soft, non-tender, non-distended MUSCULOSKELETAL:  No edema; No deformity  SKIN: Warm and dry LOWER EXTREMITIES: no swelling NEUROLOGIC:  Alert and oriented x 3 PSYCHIATRIC:  Normal affect   ASSESSMENT:    1.  Coronary artery disease involving native coronary artery of native heart without angina pectoris   2. Dyslipidemia, goal LDL below 70   3. Essential hypertension   4. Typical atrial flutter ([place])   5. Pure hypercholesterolemia    PLAN:    In order of problems listed above:  1. Coronary artery disease with details as described above.  Recent evaluation for coronary artery disease by stress testing showing no evidence of ischemia.  He is on medical therapy which include Plavix.  He is also taking Xarelto because of paroxysmal atrial flutter.  We will continue dual therapy until October when we will discontinue his Plavix.  This is because of stenting that was done to the right coronary artery. 2. Dyslipidemia: I did review K PN which showed me his last LDL of 66 and HDL 46 this is from January 2021.  He takes high intense statin Lipitor 80 which I will continue.  I will ask him to have fasting lipid profile done today. 3. Essential hypertension actually his blood pressure is slightly on the lower side.  Ask him to stay well-hydrated.  I will not reduce any of his medication at this stage. 4. History of typical atrial flutter.  He is anticoagulated denies have any palpitation, will continue present management. 5. I did review stress test with the patient.   Medication Adjustments/Labs and Tests Ordered: Current medicines are reviewed at length with the patient today.  Concerns regarding medicines are outlined above.  Orders Placed This Encounter  Procedures  . Lipid panel   Medication changes: No orders of the defined types were placed in this encounter.   Signed, Park Liter, MD, [facility] 10/26/2019 11:47 AM    [place]

## 2019-10-26 NOTE — Patient Instructions (Signed)
Medication Instructions:  Your physician recommends that you continue on your current medications as directed. Please refer to the Current Medication list given to you today.  *If you need a refill on your cardiac medications before your next appointment, please call your pharmacy*   Lab Work: TODAY:  LIPID  If you have labs (blood work) drawn today and your tests are completely normal, you will receive your results only by: Marland Kitchen MyChart Message (if you have MyChart) OR . A paper copy in the mail If you have any lab test that is abnormal or we need to change your treatment, we will call you to review the results.   Testing/Procedures: None ordered   Follow-Up: At [facility], you and your health needs are our priority.  As part of our continuing mission to provide you with exceptional heart care, we have created designated Provider Care Teams.  These Care Teams include your primary Cardiologist (physician) and Advanced Practice Providers (APPs -  Physician Assistants and Nurse Practitioners) who all work together to provide you with the care you need, when you need it.  We recommend signing up for the patient portal called "MyChart".  Sign up information is provided on this After Visit Summary.  MyChart is used to connect with patients for Virtual Visits (Telemedicine).  Patients are able to view lab/test results, encounter notes, upcoming appointments, etc.  Non-urgent messages can be sent to your provider as well.   To learn more about what you can do with MyChart, go to [email or web address].    Your next appointment:   4 month(s)  The format for your next appointment:   In Person  Provider:   Jenne Campus, MD   Other Instructions

## 2019-10-27 LAB — LIPID PANEL
Chol/HDL Ratio: 2.5 ratio (ref 0.0–5.0)
Cholesterol, Total: 112 mg/dL (ref 100–199)
HDL: 45 mg/dL (ref >39)
LDL Chol Calc (NIH): 40 mg/dL (ref 0–99)
Triglycerides: 165 mg/dL — ABNORMAL HIGH (ref 0–149)
VLDL Cholesterol Cal: 27 mg/dL (ref 5–40)

## 2019-10-31 ENCOUNTER — Other Ambulatory Visit: Payer: Self-pay

## 2019-10-31 MED ORDER — PANTOPRAZOLE SODIUM 40 MG PO TBEC: DELAYED_RELEASE_TABLET | ORAL | 1 refills | Status: DC

## 2019-10-31 NOTE — Progress Notes (Signed)
Pharmacy requested 90 day supply refills

## 2019-11-21 ENCOUNTER — Encounter: Payer: Self-pay | Admitting: Cardiology

## 2019-11-21 ENCOUNTER — Ambulatory Visit (INDEPENDENT_AMBULATORY_CARE_PROVIDER_SITE_OTHER): Payer: Medicare Other | Admitting: Cardiology

## 2019-11-21 ENCOUNTER — Other Ambulatory Visit: Payer: Self-pay

## 2019-11-21 VITALS — BP 90/60 | HR 95 | Ht 71.0 in | Wt 188.0 lb

## 2019-11-21 DIAGNOSIS — E785 Hyperlipidemia, unspecified: Secondary | ICD-10-CM | POA: Diagnosis not present

## 2019-11-21 DIAGNOSIS — Z9889 Other specified postprocedural states: Secondary | ICD-10-CM | POA: Diagnosis not present

## 2019-11-21 DIAGNOSIS — I2510 Atherosclerotic heart disease of native coronary artery without angina pectoris: Secondary | ICD-10-CM

## 2019-11-21 DIAGNOSIS — I10 Essential (primary) hypertension: Secondary | ICD-10-CM

## 2019-11-21 DIAGNOSIS — Z8679 Personal history of other diseases of the circulatory system: Secondary | ICD-10-CM

## 2019-11-21 NOTE — Patient Instructions (Signed)

## 2019-11-21 NOTE — Progress Notes (Signed)
Cardiology Office Note:    Date:  11/21/2019   ID:  Robert Lang, DOB 02-15-1951, MRN [id number]  PCP:  Mackie Pai, [name]  Cardiologist:  Jenne Campus, MD    Referring MD: Elise Benne   Chief Complaint  Patient presents with  . Follow-up  Doing well but I am weak and tired  History of Present Illness:    Robert Lang is a 69 y.o. male   with a hx of CAD s/p PCI to LAD/RCA, atrial flutter s/p ablation 2017, HLD, HTNlast seen while hospitalized.  LHC at [facility] 2017 with patent stent in prox LAD, 90% occlusion in small first diagonal artery and 25% occlusion of RCA w/ good EF 60%. Atrial flutter ablation at [facility] in 2017. Stress test 10/30/18 with no perfusion defect described as low risk study.   Presented to [facility] 01/10/19 with chest pain, code STEMI called, transferred to [facility]. Troponin peaked at [phone]. Echo preserved LV function. Underwent cardiac cath 01/10/19 with PCI and DES to RCA. Recommended for triple therapy for 1 month then stop aspirin. 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 His diltiazem was switched to coreg while hospitalized. He was find to have some chest pain few weeks ago.  After that stress test done on 10/24/2019 showing no evidence of ischemia. Comes today 2 months of follow-up.  Overall denies have any chest pain tightness squeezing pressure burning chest.  He complained of having no energy he said he does not want to do anything but he realized that part of the problem could be the fact that he does not do any exercises.  In the mother 5 he is planning to go to the gym today for the first time.  I told him that this is an excellent idea and strongly encouraged him to do that.  However, also told him if he developed some unusual symptoms while exercising like chest pain tightness squeezing pressure burning chest he need to let  me know.  Past Medical History:  Diagnosis Date  . Allergy   . Anxiety   . Arthritis   . Atrial fibrillation ([place])   . CAD (coronary artery disease)   . COPD (chronic obstructive pulmonary disease) ([place])   . Depression   . Depression   . ETOH abuse 01/11/2019   6 pack of beer per day  . GERD (gastroesophageal reflux disease)   . Heart attack ([place])   . History of colon polyps   . Hyperlipidemia   . Hypertension   . Kidney stones   . Smoker 01/11/2019    Past Surgical History:  Procedure Laterality Date  . ATRIAL FIBRILLATION ABLATION  10/2015  . BACK SURGERY    . CARDIOVERSION  2017  . CATARACT EXTRACTION Bilateral 2017   March and April 2017  . COLONOSCOPY  2018  . CORONARY STENT PLACEMENT  2012  . CORONARY/GRAFT ACUTE MI REVASCULARIZATION N/A 01/10/2019   Procedure: CORONARY/GRAFT ACUTE MI REVASCULARIZATION;  Surgeon: Leonie Man, MD;  Location: [facility];  Service: Cardiovascular;  Laterality: N/A;  . INGUINAL HERNIA REPAIR     over 20 years ago  . LEFT HEART CATH AND CORONARY ANGIOGRAPHY N/A 01/10/2019   Procedure: LEFT HEART CATH AND CORONARY ANGIOGRAPHY;  Surgeon: Leonie Man, MD;  Location: [place] [facility];  Service: Cardiovascular;  Laterality: N/A;  . LUMBAR LAMINECTOMY Bilateral 04/05/2016   L2-L5   .  VENTRAL HERNIA REPAIR  2018    Current Medications: Current Meds  Medication Sig  . atorvastatin (LIPITOR) 80 MG tablet Take 1 tablet (80 mg total) by mouth daily at 6 PM.  . buPROPion (WELLBUTRIN XL) 300 MG 24 hr tablet Take 1 tablet (300 mg total) by mouth daily.  . carvedilol (COREG) 3.125 MG tablet Take 1 tablet (3.125 mg total) by mouth 2 (two) times daily with a meal.  . Cholecalciferol (VITAMIN D-1000 MAX ST) 25 MCG (1000 UT) tablet Take 1,000 Units by mouth daily.   . clopidogrel (PLAVIX) 75 MG tablet Take 1 tablet (75 mg total) by mouth daily with breakfast.  . enalapril (VASOTEC) 5 MG tablet Take 1 tablet (5 mg total) by mouth  daily.  . famotidine (PEPCID) 40 MG tablet Take 1 tablet (40 mg total) by mouth daily.  . fexofenadine (ALLEGRA) 180 MG tablet Take 180 mg by mouth at bedtime.   . finasteride (PROSCAR) 5 MG tablet TAKE 1 TABLET BY MOUTH EVERY DAY  . gabapentin (NEURONTIN) 100 MG capsule TAKE 2 TO 3 CAPSULES BY MOUTH DAILY AS NEEDED FOR PAIN  . nitroGLYCERIN (NITROSTAT) 0.4 MG SL tablet Place 1 tablet (0.4 mg total) under the tongue every 5 (five) minutes as needed for chest pain.  . Omega-3 Fatty Acids (FISH OIL) 1000 MG CAPS Take 1,000 mg by mouth.  . pantoprazole (PROTONIX) 40 MG tablet TAKE 1 TABLET(40 MG) BY MOUTH DAILY  . ranolazine (RANEXA) 1000 MG SR tablet Take 1 tablet (1,000 mg total) by mouth 2 (two) times daily. Take 500 mg (1/2 tablet) for 2 weeks then increase to 1000 mg (1 tablet) twice daily.  . rivaroxaban (XARELTO) 20 MG TABS tablet TAKE 1 TABLET(20 MG) BY MOUTH DAILY WITH SUPPER  . sertraline (ZOLOFT) 100 MG tablet Take 150 mg by mouth at bedtime.   . solifenacin (VESICARE) 5 MG tablet Take 5 mg by mouth daily.  . tamsulosin (FLOMAX) 0.4 MG CAPS capsule TAKE 1 CAPSULE(0.4 MG) BY MOUTH DAILY  . Turmeric 500 MG CAPS Take 1 capsule by mouth 2 (two) times daily.   . vitamin B-12 (CYANOCOBALAMIN) 1000 MCG tablet Take 1,000 mcg by mouth daily.     Allergies:   Naproxen   Social History   Socioeconomic History  . Marital status: Married    Spouse name: Not on file  . Number of children: Not on file  . Years of education: Not on file  . Highest education level: Not on file  Occupational History  . Not on file  Tobacco Use  . Smoking status: Former Smoker    Packs/day: 1.00    Years: 30.00    Pack years: 30.00  . Smokeless tobacco: Former Systems developer  . Tobacco comment: 01/10/2019  Vaping Use  . Vaping Use: Never used  Substance and Sexual Activity  . Alcohol use: Yes    Alcohol/week: 6.0 standard drinks    Types: 6 Cans of beer per week    Comment: 6 pack a day  . Drug use: Never  .  Sexual activity: Not on file  Other Topics Concern  . Not on file  Social History Narrative  . Not on file   Social Determinants of Health   Financial Resource Strain:   . Difficulty of Paying Living Expenses: Not on file  Food Insecurity:   . Worried About Charity fundraiser in the Last Year: Not on file  . Ran Out of Food in the Last Year: Not on  file  Transportation Needs: No Transportation Needs  . Lack of Transportation (Medical): No  . Lack of Transportation (Non-Medical): No  Physical Activity:   . Days of Exercise per Week: Not on file  . Minutes of Exercise per Session: Not on file  Stress:   . Feeling of Stress : Not on file  Social Connections:   . Frequency of Communication with Friends and Family: Not on file  . Frequency of Social Gatherings with Friends and Family: Not on file  . Attends Religious Services: Not on file  . Active Member of Clubs or Organizations: Not on file  . Attends Archivist Meetings: Not on file  . Marital Status: Not on file     Family History: The patient's family history includes Colon cancer in his father; Throat cancer in his brother. ROS:   Please see the history of present illness.    All 14 point review of systems negative except as described per history of present illness  EKGs/Labs/Other Studies Reviewed:      Recent Labs: 04/20/2019: ALT 28 10/19/2019: BUN 11; Creatinine, Ser 0.75; Hemoglobin 14.4; Platelets 186; Potassium 4.1; Sodium 133  Recent Lipid Panel    Component Value Date/Time   CHOL 112 10/26/2019 1152   TRIG 165 (H) 10/26/2019 1152   HDL 45 10/26/2019 1152   CHOLHDL 2.5 10/26/2019 1152   CHOLHDL 3.4 01/10/2019 1328   VLDL 43 (H) 01/10/2019 1328   LDLCALC 40 10/26/2019 1152   LDLDIRECT 66.0 09/06/2018 0924    Physical Exam:    VS:  BP 90/60 (BP Location: Right Arm, Patient Position: Sitting, Cuff Size: Normal)   Pulse 95   Ht 5\' 11"  (1.803 m)   Wt 188 lb (85.3 kg)   SpO2 93%   BMI 26.22  kg/m     Wt Readings from Last 3 Encounters:  11/21/19 188 lb (85.3 kg)  10/26/19 189 lb 1.3 oz (85.8 kg)  10/24/19 188 lb (85.3 kg)     GEN:  Well nourished, well developed in no acute distress HEENT: Normal NECK: No JVD; No carotid bruits LYMPHATICS: No lymphadenopathy CARDIAC: RRR, no murmurs, no rubs, no gallops RESPIRATORY:  Clear to auscultation without rales, wheezing or rhonchi  ABDOMEN: Soft, non-tender, non-distended MUSCULOSKELETAL:  No edema; No deformity  SKIN: Warm and dry LOWER EXTREMITIES: no swelling NEUROLOGIC:  Alert and oriented x 3 PSYCHIATRIC:  Normal affect   ASSESSMENT:    1. Coronary artery disease involving native coronary artery of native heart without angina pectoris   2. Essential hypertension   3. Status post ablation of atrial flutter   4. Dyslipidemia, goal LDL below 70    PLAN:    In order of problems listed above:  1. Coronary artery disease doing well from that point review asymptomatic.  Recent stress test analyzed with the patient showed no evidence of ischemia.  Next month of October will be 1 year of Xarelto as well as Plavix, I advised him to discontinue Plavix next month. 2. Essential hypertension: Blood pressure actually on the lower side today that could be part of the reason why he is feeling weak and tired.  I will continue monitoring the situation.  I prefer not to cut down his Vasotec or beta-blocker since his dose is such an important medication for his issues.  I told him if he does not feel any better with start of exercises he need to let me know then will act on that. 3. Dyslipidemia: I do  have his fasting lipid profile and K PN showing HDL 45 LDL 66.  We will continue present management which include high intense statin in form of Lipitor 80. 4. History of paroxysmal atrial fib flutter: Denies having a palpitations: Anticoagulated.   Medication Adjustments/Labs and Tests Ordered: Current medicines are reviewed at length with  the patient today.  Concerns regarding medicines are outlined above.  No orders of the defined types were placed in this encounter.  Medication changes: No orders of the defined types were placed in this encounter.   Signed, Park Liter, MD, [facility] 11/21/2019 9:00 AM    [place]

## 2019-12-07 ENCOUNTER — Other Ambulatory Visit: Payer: Self-pay | Admitting: Medical

## 2019-12-08 ENCOUNTER — Other Ambulatory Visit: Payer: Self-pay | Admitting: Medical

## 2020-01-01 ENCOUNTER — Telehealth: Payer: Self-pay | Admitting: Medical

## 2020-01-01 NOTE — Telephone Encounter (Signed)
   Patient would like to know if he need to stop plavix. Please advise

## 2020-01-01 NOTE — Telephone Encounter (Signed)
Per cardiologist note he wanted pt to stop plavix at beginning of October. Notify pt.

## 2020-01-02 NOTE — Telephone Encounter (Signed)
Called pt and unable to lvm

## 2020-01-02 NOTE — Telephone Encounter (Signed)
Pt.notified

## 2020-01-24 ENCOUNTER — Other Ambulatory Visit: Payer: Self-pay | Admitting: Medical

## 2020-01-25 ENCOUNTER — Ambulatory Visit (INDEPENDENT_AMBULATORY_CARE_PROVIDER_SITE_OTHER): Payer: Medicare Other | Admitting: Medical

## 2020-01-25 ENCOUNTER — Telehealth: Payer: Self-pay | Admitting: Medical

## 2020-01-25 ENCOUNTER — Other Ambulatory Visit: Payer: Self-pay

## 2020-01-25 ENCOUNTER — Ambulatory Visit: Payer: Medicare Other | Admitting: Medical

## 2020-01-25 VITALS — BP 94/50 | HR 89 | Temp 97.6°F | Resp 12 | Ht 71.0 in | Wt 192.8 lb

## 2020-01-25 DIAGNOSIS — S01512A Laceration without foreign body of oral cavity, initial encounter: Secondary | ICD-10-CM

## 2020-01-25 DIAGNOSIS — G4700 Insomnia, unspecified: Secondary | ICD-10-CM | POA: Diagnosis not present

## 2020-01-25 DIAGNOSIS — I9589 Other hypotension: Secondary | ICD-10-CM

## 2020-01-25 DIAGNOSIS — G629 Polyneuropathy, unspecified: Secondary | ICD-10-CM

## 2020-01-25 DIAGNOSIS — I959 Hypotension, unspecified: Secondary | ICD-10-CM

## 2020-01-25 DIAGNOSIS — I2510 Atherosclerotic heart disease of native coronary artery without angina pectoris: Secondary | ICD-10-CM | POA: Diagnosis not present

## 2020-01-25 MED ORDER — ZOLPIDEM TARTRATE 5 MG PO TABS
5.0000 mg | ORAL_TABLET | Freq: Every evening | ORAL | 0 refills | Status: DC | PRN
Start: 2020-01-25 — End: 2020-01-29

## 2020-01-25 MED ORDER — MAGIC MOUTHWASH W/LIDOCAINE: ORAL | 0 refills | Status: DC

## 2020-01-25 NOTE — Addendum Note (Signed)
Addended by: Anabel Halon on: 01/25/2020 11:34 AM   Modules accepted: Orders

## 2020-01-25 NOTE — Telephone Encounter (Signed)
Pharmacy called for clarification on magic mouthwash. I clarified with provider and gave details to pharmacy. Equal volumes of viscous/lidocaine, liquid benadryl and Maalox.

## 2020-01-25 NOTE — Patient Instructions (Signed)
You do have small puncture wound/laceration in the left side upper palate.  This area is already healing.  On inspection I do not see any active bleeding.  Prescribed Magic mouthwash to use 90mL 4 times daily swish and spit.  If you do eat something and you noticed some bleeding you could apply some small volume Afrin to the area for about 10 to 15 seconds and that could help stop a localized bleeding.  Mucosa typically heals very fast.  More than 2 days since original injury.  Your blood pressure is low today.  I want to check a CBC and metabolic panel today.  Check your blood pressures daily and write those down.  After reviewing those readings early next week we will pass results to Dr. Agustin Cree.  He was hesitant to adjust BP meds last time but if they remain low and you have persisting lightheadedness on position changes then dose change might be indicated.  I be careful on changing position.  For insomnia, I prescribed low-dose Ambien.  Rx advised and given. Only use 3 times a week or so as needed.  Neuropathy despite use of 300 to 400 mg gabapentin at night.  Would advise in the near future trying 600 mg at night.  However do not do this presently as you are starting Ambien.  If higher dose works please let me know.  Follow-up next Tuesday or as needed.

## 2020-01-25 NOTE — Progress Notes (Signed)
Subjective:    Patient ID: Robert Lang, male    DOB: 1950-12-04, 69 y.o.   MRN: [id number]  HPI  Pt in with mall cut to his left side palate. He was eating peaches and cottage cheese Tuesday night. He states eating both and when bit down felt something charge. He grabbed inside his mouth and saw 2 inch curved piece of plastic.   Pt is on xarelto. Bled some last night.  Are is slight sore. When eats it bleeds little bit.  Pt states trouble sleeping every night. For years.   Hx of lower ext neuropathy. Pt is gabapentin at night. He state current dose does not help.  Lower bp readings. Dr. Kirkland Hun aware runs low but did not adjust bp meds last time. When he stands up does feel light headed or when bends over does as well.      Review of Systems  Constitutional: Negative for chills, fatigue and fever.  HENT:       See hpi.  Respiratory: Negative for cough, chest tightness, shortness of breath and wheezing.   Cardiovascular: Negative for chest pain and palpitations.  Musculoskeletal: Negative for back pain and joint swelling.  Hematological: Negative for adenopathy. Does not bruise/bleed easily.  Psychiatric/Behavioral: Negative for behavioral problems, confusion and dysphoric mood. The patient is not nervous/anxious.     Past Medical History:  Diagnosis Date   Allergy    Anxiety    Arthritis    Atrial fibrillation ([facility])    CAD (coronary artery disease)    COPD (chronic obstructive pulmonary disease) ([facility])    Depression    Depression    ETOH abuse 01/11/2019   6 pack of beer per day   GERD (gastroesophageal reflux disease)    Heart attack ([place])    History of colon polyps    Hyperlipidemia    Hypertension    Kidney stones    Smoker 01/11/2019     Social History   Socioeconomic History   Marital status: Married    Spouse name: Not on file   Number of children: Not on file   Years of education: Not on file   Highest education level:  Not on file  Occupational History   Not on file  Tobacco Use   Smoking status: Former Smoker    Packs/day: 1.00    Years: 30.00    Pack years: 30.00   Smokeless tobacco: Former Systems developer   Tobacco comment: 01/10/2019  Vaping Use   Vaping Use: Never used  Substance and Sexual Activity   Alcohol use: Yes    Alcohol/week: 6.0 standard drinks    Types: 6 Cans of beer per week    Comment: 6 pack a day   Drug use: Never   Sexual activity: Not on file  Other Topics Concern   Not on file  Social History Narrative   Not on file   Social Determinants of Health   Financial Resource Strain:    Difficulty of Paying Living Expenses: Not on file  Food Insecurity:    Worried About [place] in the Last Year: Not on file   Ran Out of Food in the Last Year: Not on file  Transportation Needs:    Lack of Transportation (Medical): Not on file   Lack of Transportation (Non-Medical): Not on file  Physical Activity:    Days of Exercise per Week: Not on file   Minutes of Exercise per Session: Not on file  Stress:  Feeling of Stress : Not on file  Social Connections:    Frequency of Communication with Friends and Family: Not on file   Frequency of Social Gatherings with Friends and Family: Not on file   Attends Religious Services: Not on file   Active Member of Clubs or Organizations: Not on file   Attends Archivist Meetings: Not on file   Marital Status: Not on file  Intimate Partner Violence:    Fear of Current or Ex-Partner: Not on file   Emotionally Abused: Not on file   Physically Abused: Not on file   Sexually Abused: Not on file    Past Surgical History:  Procedure Laterality Date   ATRIAL FIBRILLATION ABLATION  10/2015   BACK SURGERY     CARDIOVERSION  2017   CATARACT EXTRACTION Bilateral 2017   March and April 2017   COLONOSCOPY  2018   CORONARY STENT PLACEMENT  2012   CORONARY/GRAFT ACUTE MI REVASCULARIZATION N/A  01/10/2019   Procedure: CORONARY/GRAFT ACUTE MI REVASCULARIZATION;  Surgeon: Leonie Man, MD;  Location: [facility];  Service: Cardiovascular;  Laterality: N/A;   INGUINAL HERNIA REPAIR     over 20 years ago   LEFT HEART CATH AND CORONARY ANGIOGRAPHY N/A 01/10/2019   Procedure: LEFT HEART CATH AND CORONARY ANGIOGRAPHY;  Surgeon: Leonie Man, MD;  Location: [facility];  Service: Cardiovascular;  Laterality: N/A;   LUMBAR LAMINECTOMY Bilateral 04/05/2016   L2-L5    VENTRAL HERNIA REPAIR  2018    Family History  Problem Relation Age of Onset   Colon cancer Father    Throat cancer Brother     Allergies  Allergen Reactions   Naproxen Other (See Comments)    (Naprosyn *ANALGESICS - ANTI-INFLAMMATORY*) Nausea, Abdominal pain    Current Outpatient Medications on File Prior to Visit  Medication Sig Dispense Refill   atorvastatin (LIPITOR) 80 MG tablet Take 1 tablet (80 mg total) by mouth daily at 6 PM. 30 tablet 11   buPROPion (WELLBUTRIN XL) 300 MG 24 hr tablet Take 1 tablet (300 mg total) by mouth daily. 90 tablet 0   carvedilol (COREG) 3.125 MG tablet Take 1 tablet (3.125 mg total) by mouth 2 (two) times daily with a meal. 90 tablet 3   Cholecalciferol (VITAMIN D-1000 MAX ST) 25 MCG (1000 UT) tablet Take 1,000 Units by mouth daily.      clopidogrel (PLAVIX) 75 MG tablet Take 1 tablet (75 mg total) by mouth daily with breakfast. 30 tablet 11   enalapril (VASOTEC) 5 MG tablet Take 1 tablet (5 mg total) by mouth daily. 90 tablet 1   famotidine (PEPCID) 40 MG tablet Take 1 tablet (40 mg total) by mouth daily. 90 tablet 3   fexofenadine (ALLEGRA) 180 MG tablet Take 180 mg by mouth at bedtime.      finasteride (PROSCAR) 5 MG tablet TAKE 1 TABLET BY MOUTH EVERY DAY 90 tablet 1   gabapentin (NEURONTIN) 100 MG capsule TAKE 2 TO 3 CAPSULES BY MOUTH DAILY AS NEEDED FOR PAIN 90 capsule 0   nitroGLYCERIN (NITROSTAT) 0.4 MG SL tablet Place 1 tablet (0.4 mg  total) under the tongue every 5 (five) minutes as needed for chest pain. 25 tablet 3   Omega-3 Fatty Acids (FISH OIL) 1000 MG CAPS Take 1,000 mg by mouth.     pantoprazole (PROTONIX) 40 MG tablet TAKE 1 TABLET(40 MG) BY MOUTH DAILY 90 tablet 1   ranolazine (RANEXA) 1000 MG SR tablet  Take 1 tablet (1,000 mg total) by mouth 2 (two) times daily. Take 500 mg (1/2 tablet) for 2 weeks then increase to 1000 mg (1 tablet) twice daily. 90 tablet 3   rivaroxaban (XARELTO) 20 MG TABS tablet TAKE 1 TABLET(20 MG) BY MOUTH DAILY WITH SUPPER 30 tablet 5   sertraline (ZOLOFT) 100 MG tablet Take 150 mg by mouth at bedtime.      solifenacin (VESICARE) 5 MG tablet Take 5 mg by mouth daily.     tamsulosin (FLOMAX) 0.4 MG CAPS capsule TAKE 1 CAPSULE(0.4 MG) BY MOUTH DAILY 90 capsule 0   Turmeric 500 MG CAPS Take 1 capsule by mouth 2 (two) times daily.      vitamin B-12 (CYANOCOBALAMIN) 1000 MCG tablet Take 1,000 mcg by mouth daily.     No current facility-administered medications on file prior to visit.    BP (!) 94/50 (BP Location: Right Arm, Cuff Size: Large)    Pulse 89    Temp 97.6 F (36.4 C) (Oral)    Resp 12    Ht 5\' 11"  (1.803 m)    Wt 192 lb 12.8 oz (87.5 kg)    SpO2 97%    BMI 26.89 kg/m       Objective:   Physical Exam  General Mental Status- Alert. General Appearance- Not in acute distress.   Skin General: Color- Normal Color. Moisture- Normal Moisture.  Neck Carotid Arteries- Normal color. Moisture- Normal Moisture. No carotid bruits. No JVD.  Mouth-on inspection of upper palate/to the left of midline he has small 1 to 69mm puncture wound.  Looks like the area is already healing.  No active bleeding.  Mild tenderness to palpation.  Chest and Lung Exam Auscultation: Breath Sounds:-Normal.  Cardiovascular Auscultation:Rythm- Regular. Murmurs & Other Heart Sounds:Auscultation of the heart reveals- No Murmurs.  Abdomen Inspection:-Inspeection  Normal. Palpation/Percussion:Note:No mass. Palpation and Percussion of the abdomen reveal- Non Tender, Non Distended + BS, no rebound or guarding.    Neurologic Cranial Nerve exam:- CN III-XII intact(No nystagmus), symmetric smile. Strength:- 5/5 equal and symmetric strength both upper and lower extremities.      Assessment & Plan:  You do have small puncture wound/laceration in the left side upper palate.  This area is already healing.  On inspection I do not see any active bleeding.  Prescribed Magic mouthwash to use 11mL 4 times daily swish and spit.  If you do eat something and you noticed some bleeding you could apply some small volume Afrin to the area for about 10 to 15 seconds and that could help stop a localized bleeding.  Mucosa typically heals very fast.  More than 2 days since original injury.  Your blood pressure is low today.  I want to check a CBC and metabolic panel today.  Check your blood pressures daily and write those down.  After reviewing those readings early next week we will pass results to Dr. Agustin Cree.  He was hesitant to adjust BP meds last time but if they remain low and you have persisting lightheadedness on position changes then dose change might be indicated.  I be careful on changing position.  For insomnia, I prescribed low-dose Ambien.  Rx advised and given. Only use 3 times a week or so as needed.  Neuropathy despite use of 300 to 400 mg gabapentin at night.  Would advise in the near future trying 600 mg at night.  However do not do this presently as you are starting Ambien.  If higher dose  works please let me know.  Follow-up next Tuesday or as needed.

## 2020-01-29 ENCOUNTER — Ambulatory Visit (INDEPENDENT_AMBULATORY_CARE_PROVIDER_SITE_OTHER): Payer: Medicare Other | Admitting: Medical

## 2020-01-29 ENCOUNTER — Other Ambulatory Visit: Payer: Self-pay

## 2020-01-29 VITALS — BP 105/85 | HR 68 | Temp 97.6°F | Resp 18 | Ht 71.0 in | Wt 193.6 lb

## 2020-01-29 DIAGNOSIS — I2510 Atherosclerotic heart disease of native coronary artery without angina pectoris: Secondary | ICD-10-CM

## 2020-01-29 DIAGNOSIS — I10 Essential (primary) hypertension: Secondary | ICD-10-CM

## 2020-01-29 DIAGNOSIS — G629 Polyneuropathy, unspecified: Secondary | ICD-10-CM | POA: Diagnosis not present

## 2020-01-29 DIAGNOSIS — G4700 Insomnia, unspecified: Secondary | ICD-10-CM | POA: Diagnosis not present

## 2020-01-29 DIAGNOSIS — S01512A Laceration without foreign body of oral cavity, initial encounter: Secondary | ICD-10-CM | POA: Diagnosis not present

## 2020-01-29 DIAGNOSIS — S025XXA Fracture of tooth (traumatic), initial encounter for closed fracture: Secondary | ICD-10-CM

## 2020-01-29 LAB — CBC WITH DIFFERENTIAL/PLATELET
Absolute Monocytes: 712 cells/uL (ref 200–950)
Basophils Absolute: 50 cells/uL (ref 0–200)
Basophils Relative: 0.8 %
Eosinophils Absolute: 0 cells/uL — ABNORMAL LOW (ref 15–500)
Eosinophils Relative: 0 %
HCT: 43.5 % (ref 38.5–50.0)
Hemoglobin: 14.7 g/dL (ref 13.2–17.1)
Lymphs Abs: 1033 cells/uL (ref 850–3900)
MCH: 34.3 pg — ABNORMAL HIGH (ref 27.0–33.0)
MCHC: 33.8 g/dL (ref 32.0–36.0)
MCV: 101.4 fL — ABNORMAL HIGH (ref 80.0–100.0)
MPV: 9.8 fL (ref 7.5–12.5)
Monocytes Relative: 11.3 %
Neutro Abs: 4505 cells/uL (ref 1500–7800)
Neutrophils Relative %: 71.5 %
Platelets: 237 10*3/uL (ref 140–400)
RBC: 4.29 10*6/uL (ref 4.20–5.80)
RDW: 11.9 % (ref 11.0–15.0)
Total Lymphocyte: 16.4 %
WBC: 6.3 10*3/uL (ref 3.8–10.8)

## 2020-01-29 LAB — COMPREHENSIVE METABOLIC PANEL
AG Ratio: 1.4 (calc) (ref 1.0–2.5)
ALT: 21 U/L (ref 9–46)
AST: 19 U/L (ref 10–35)
Albumin: 4 g/dL (ref 3.6–5.1)
Alkaline phosphatase (APISO): 65 U/L (ref 35–144)
BUN: 13 mg/dL (ref 7–25)
CO2: 28 mmol/L (ref 20–32)
Calcium: 9.2 mg/dL (ref 8.6–10.3)
Chloride: 102 mmol/L (ref 98–110)
Creat: 0.95 mg/dL (ref 0.70–1.25)
Globulin: 2.8 g/dL (calc) (ref 1.9–3.7)
Glucose, Bld: 129 mg/dL — ABNORMAL HIGH (ref 65–99)
Potassium: 4.3 mmol/L (ref 3.5–5.3)
Sodium: 136 mmol/L (ref 135–146)
Total Bilirubin: 0.7 mg/dL (ref 0.2–1.2)
Total Protein: 6.8 g/dL (ref 6.1–8.1)

## 2020-01-29 LAB — TEST AUTHORIZATION

## 2020-01-29 LAB — HEMOGLOBIN A1C W/OUT EAG: Hgb A1c MFr Bld: 5.1 % of total Hgb (ref <5.7)

## 2020-01-29 MED ORDER — TRAZODONE HCL 50 MG PO TABS: 25.0000 mg | ORAL_TABLET | Freq: Every evening | ORAL | 3 refills | Status: DC | PRN

## 2020-01-29 NOTE — Progress Notes (Signed)
Subjective:    Patient ID: Robert Lang, male    DOB: March 28, 1950, 69 y.o.   MRN: [id number]  HPI  Pt in for follow up.  Pt has history of insomnia. See last note. He took [place] and he did not sleep much. Up and down per pt and wife. Failed melatonin.   Pt bp is still on lower side but little better than last time. I had asked pt to check bp on daily basis since last visit. But he forgot.  Pt roof of mouth is healed now.  Pt has chipped tooth recently. No pain.   Pt lower ext neuropathy is stable.    Review of Systems  Constitutional: Negative for chills, fatigue and fever.  Respiratory: Negative for cough, chest tightness, shortness of breath and wheezing.   Cardiovascular: Negative for chest pain and palpitations.  Gastrointestinal: Negative for abdominal pain, constipation and diarrhea.  Genitourinary: Negative for dysuria.  Musculoskeletal: Negative for back pain and gait problem.  Skin: Negative for rash.  Neurological: Negative for dizziness, syncope, weakness and light-headedness.  Hematological: Negative for adenopathy. Does not bruise/bleed easily.  Psychiatric/Behavioral: Negative for behavioral problems, confusion and suicidal ideas. The patient is not nervous/anxious.     Past Medical History:  Diagnosis Date   Allergy    Anxiety    Arthritis    Atrial fibrillation (HCC)    CAD (coronary artery disease)    COPD (chronic obstructive pulmonary disease) (HCC)    Depression    Depression    ETOH abuse 01/11/2019   6 pack of beer per day   GERD (gastroesophageal reflux disease)    Heart attack ([place])    History of colon polyps    Hyperlipidemia    Hypertension    Kidney stones    Smoker 01/11/2019     Social History   Socioeconomic History   Marital status: Married    Spouse name: Not on file   Number of children: Not on file   Years of education: Not on file   Highest education level: Not on file  Occupational History    Not on file  Tobacco Use   Smoking status: Former Smoker    Packs/day: 1.00    Years: 30.00    Pack years: 30.00   Smokeless tobacco: Former Systems developer   Tobacco comment: 01/10/2019  Vaping Use   Vaping Use: Never used  Substance and Sexual Activity   Alcohol use: Yes    Alcohol/week: 6.0 standard drinks    Types: 6 Cans of beer per week    Comment: 6 pack a day   Drug use: Never   Sexual activity: Not on file  Other Topics Concern   Not on file  Social History Narrative   Not on file   Social Determinants of Health   Financial Resource Strain:    Difficulty of Paying Living Expenses: Not on file  Food Insecurity:    Worried About [place] in the Last Year: Not on file   Ran Out of Food in the Last Year: Not on file  Transportation Needs:    Lack of Transportation (Medical): Not on file   Lack of Transportation (Non-Medical): Not on file  Physical Activity:    Days of Exercise per Week: Not on file   Minutes of Exercise per Session: Not on file  Stress:    Feeling of Stress : Not on file  Social Connections:    Frequency of Communication with Friends and Family:  Not on file   Frequency of Social Gatherings with Friends and Family: Not on file   Attends Religious Services: Not on file   Active Member of Clubs or Organizations: Not on file   Attends Club or Organization Meetings: Not on file   Marital Status: Not on file  Intimate Partner Violence:    Fear of Current or Ex-Partner: Not on file   Emotionally Abused: Not on file   Physically Abused: Not on file   Sexually Abused: Not on file    Past Surgical History:  Procedure Laterality Date   ATRIAL FIBRILLATION ABLATION  10/2015   BACK SURGERY     CARDIOVERSION  2017   CATARACT EXTRACTION Bilateral 2017   March and April 2017   COLONOSCOPY  2018   CORONARY STENT PLACEMENT  2012   CORONARY/GRAFT ACUTE MI REVASCULARIZATION N/A 01/10/2019   Procedure: CORONARY/GRAFT  ACUTE MI REVASCULARIZATION;  Surgeon: Leonie Man, MD;  Location: [facility];  Service: Cardiovascular;  Laterality: N/A;   INGUINAL HERNIA REPAIR     over 20 years ago   LEFT HEART CATH AND CORONARY ANGIOGRAPHY N/A 01/10/2019   Procedure: LEFT HEART CATH AND CORONARY ANGIOGRAPHY;  Surgeon: Leonie Man, MD;  Location: [facility];  Service: Cardiovascular;  Laterality: N/A;   LUMBAR LAMINECTOMY Bilateral 04/05/2016   L2-L5    VENTRAL HERNIA REPAIR  2018    Family History  Problem Relation Age of Onset   Colon cancer Father    Throat cancer Brother     Allergies  Allergen Reactions   Naproxen Other (See Comments)    (Naprosyn *ANALGESICS - ANTI-INFLAMMATORY*) Nausea, Abdominal pain    Current Outpatient Medications on File Prior to Visit  Medication Sig Dispense Refill   atorvastatin (LIPITOR) 80 MG tablet Take 1 tablet (80 mg total) by mouth daily at 6 PM. 30 tablet 11   buPROPion (WELLBUTRIN XL) 300 MG 24 hr tablet Take 1 tablet (300 mg total) by mouth daily. 90 tablet 0   carvedilol (COREG) 3.125 MG tablet Take 1 tablet (3.125 mg total) by mouth 2 (two) times daily with a meal. 90 tablet 3   Cholecalciferol (VITAMIN D-1000 MAX ST) 25 MCG (1000 UT) tablet Take 1,000 Units by mouth daily.      clopidogrel (PLAVIX) 75 MG tablet Take 1 tablet (75 mg total) by mouth daily with breakfast. 30 tablet 11   enalapril (VASOTEC) 5 MG tablet Take 1 tablet (5 mg total) by mouth daily. 90 tablet 1   famotidine (PEPCID) 40 MG tablet Take 1 tablet (40 mg total) by mouth daily. 90 tablet 3   fexofenadine (ALLEGRA) 180 MG tablet Take 180 mg by mouth at bedtime.      finasteride (PROSCAR) 5 MG tablet TAKE 1 TABLET BY MOUTH EVERY DAY 90 tablet 1   gabapentin (NEURONTIN) 100 MG capsule TAKE 2 TO 3 CAPSULES BY MOUTH DAILY AS NEEDED FOR PAIN 90 capsule 0   magic mouthwash w/lidocaine SOLN 5 ml po qid swish and spit. 200 mL 0   Multiple Vitamins-Minerals (RA  VISION-VITE PRESERVE PO) Take by mouth.     nitroGLYCERIN (NITROSTAT) 0.4 MG SL tablet Place 1 tablet (0.4 mg total) under the tongue every 5 (five) minutes as needed for chest pain. 25 tablet 3   Omega-3 Fatty Acids (FISH OIL) 1000 MG CAPS Take 1,000 mg by mouth.     pantoprazole (PROTONIX) 40 MG tablet TAKE 1 TABLET(40 MG) BY MOUTH DAILY 90 tablet  1   ranolazine (RANEXA) 1000 MG SR tablet Take 1 tablet (1,000 mg total) by mouth 2 (two) times daily. Take 500 mg (1/2 tablet) for 2 weeks then increase to 1000 mg (1 tablet) twice daily. 90 tablet 3   rivaroxaban (XARELTO) 20 MG TABS tablet TAKE 1 TABLET(20 MG) BY MOUTH DAILY WITH SUPPER 30 tablet 5   sertraline (ZOLOFT) 100 MG tablet Take 150 mg by mouth at bedtime.      solifenacin (VESICARE) 5 MG tablet Take 5 mg by mouth daily.     tamsulosin (FLOMAX) 0.4 MG CAPS capsule TAKE 1 CAPSULE(0.4 MG) BY MOUTH DAILY 90 capsule 0   Turmeric 500 MG CAPS Take 1 capsule by mouth 2 (two) times daily.      vitamin B-12 (CYANOCOBALAMIN) 1000 MCG tablet Take 1,000 mcg by mouth daily.     No current facility-administered medications on file prior to visit.    BP 105/85    Pulse 68    Temp 97.6 F (36.4 C) (Oral)    Resp 18    Ht 5\' 11"  (1.803 m)    Wt 193 lb 9.6 oz (87.8 kg)    SpO2 96%    BMI 27.00 kg/m       Objective:   Physical Exam  General Mental Status- Alert. General Appearance- Not in acute distress.    Mouth- roof of mouth is healed..  Neck Carotid Arteries- Normal color. Moisture- Normal Moisture. No carotid bruits. No JVD.  Chest and Lung Exam Auscultation: Breath Sounds:-Normal.  Cardiovascular Auscultation:Rythm- Regular. Murmurs & Other Heart Sounds:Auscultation of the heart reveals- No Murmurs.  Abdomen Inspection:-Inspeection Normal. Palpation/Percussion:Note:No mass. Palpation and Percussion of the abdomen reveal- Non Tender, Non Distended + BS, no rebound or guarding.   Neurologic Cranial Nerve exam:- CN  III-XII intact(No nystagmus), symmetric smile. Strength:- 5/5 equal and symmetric strength both upper and lower extremities.      Assessment & Plan:  You have hx of htn but very well controlled. Bp is actually on lower end with current medications. Your cardiologist hesitant to adjust. Bp higher today.  Roof of mouth is healed.   For neuropathy continue gabapentin.  For insomnia rx trazadone. Try low dose first. Rx advisement. Read pt education information on serotonin syndrome. If any symptoms occur stop both sertraline and trazadone and notify us. If on weekend or after hour then ED evaluation.  For chipped tooth see dentist. If pain then see dentist.   Follow up 3 months or as needed

## 2020-01-29 NOTE — Patient Instructions (Addendum)
You have hx of htn but very well controlled. Bp is actually on lower end with current medications. Your cardiologist hesitant to adjust. Bp higher today. Check bp daily for next 3 days. Give [place] update on readings. If too low will need to update cardiologist to see if will adjust.  Roof of mouth is healed.   For neuropathy continue gabapentin.  For insomnia rx trazadone. Try low dose first. Rx advisement. Read pt education information on serotonin syndrome. If any symptoms occur stop both sertraline and trazadone and notify us. If on weekend or after hour then ED evaluation.  For chipped tooth see dentist. If pain then see dentist.   Follow up 3 months or as needed   Serotonin Syndrome Serotonin is a chemical in your body (neurotransmitter) that helps to control several functions, such as:  Brain and nerve cell function.  Mood and emotions.  Memory.  Eating.  Sleeping.  Sexual activity.  Stress response. Having too much serotonin in your body can cause serotonin syndrome. This condition can be harmful to your brain and nerve cells. This can be a life-threatening condition. What are the causes? This condition may be caused by taking medicines or drugs that increase the level of serotonin in your body, such as:  Antidepressant medicines.  Migraine medicines.  Certain pain medicines.  Certain drugs, including ecstasy, LSD, cocaine, and amphetamines.  Over-the-counter cough or cold medicines that contain dextromethorphan.  Certain herbal supplements, including St. John's wort, ginseng, and nutmeg. This condition usually occurs when you take these medicines or drugs in combination, but it can also happen with a high dose of a single medicine or drug. What increases the risk? You are more likely to develop this condition if:  You just started taking a medicine or drug that increases the level of serotonin in the body.  You recently increased the dose of a medicine or drug  that increases the level of serotonin in the body.  You take more than one medicine or drug that increases the level of serotonin in the body. What are the signs or symptoms? Symptoms of this condition usually start within several hours of taking a medicine or drug. Symptoms may be mild or severe. Mild symptoms include:  Sweating.  Restlessness or agitation.  Muscle twitching or stiffness.  Rapid heart rate.  Nausea and vomiting.  Diarrhea.  Headache.  Shivering or goose bumps.  Confusion. Severe symptoms include:  Irregular heartbeat.  Seizures.  Loss of consciousness.  High fever. How is this diagnosed? This condition may be diagnosed based on:  Your medical history.  A physical exam.  Your prior use of drugs and medicines.  Blood or urine tests. These may be used to rule out other causes of your symptoms. How is this treated? The treatment for this condition depends on the severity of your symptoms.  For mild cases, stopping the medicine or drug that caused your condition is usually all that is needed.  For moderate to severe cases, treatment in a hospital may be needed to prevent or manage life-threatening symptoms. This may include medicines to control your symptoms, IV fluids, interventions to support your breathing, and treatments to control your body temperature. Follow these instructions at home: Medicines   Take over-the-counter and prescription medicines only as told by your health care provider. This is important.  Check with your health care provider before you start taking any new prescriptions, over-the-counter medicines, herbs, or supplements.  Avoid combining any medicines that can cause  this condition to occur. Lifestyle   Maintain a healthy lifestyle. ? Eat a healthy diet that includes plenty of vegetables, fruits, whole grains, low-fat dairy products, and lean protein. Do not eat a lot of foods that are high in fat, added sugars, or  salt. ? Get the right amount and quality of sleep. Most adults need 7-9 hours of sleep each night. ? Make time to exercise, even if it is only for short periods of time. Most adults should exercise for at least 150 minutes each week. ? Do not drink alcohol. ? Do not use illegal drugs, and do not take medicines for reasons other than they are prescribed. General instructions  Do not use any products that contain nicotine or tobacco, such as cigarettes and e-cigarettes. If you need help quitting, ask your health care provider.  Keep all follow-up visits as told by your health care provider. This is important. Contact a health care provider if:  Your symptoms do not improve or they get worse. Get help right away if you:  Have worsening confusion, severe headache, chest pain, high fever, seizures, or loss of consciousness.  Experience serious side effects of medicine, such as swelling of your face, lips, tongue, or throat.  Have serious thoughts about hurting yourself or others. These symptoms may represent a serious problem that is an emergency. Do not wait to see if the symptoms will go away. Get medical help right away. Call your local emergency services (911 in the [place] Do not drive yourself to the hospital. If you ever feel like you may hurt yourself or others, or have thoughts about taking your own life, get help right away. You can go to your nearest emergency department or call:  Your local emergency services (911 in the [place]  A suicide crisis helpline, such as the [facility] at 3850234680. This is open 24 hours a day. Summary  Serotonin is a brain chemical that helps to regulate the nervous system. High levels of serotonin in the body can cause serotonin syndrome, which is a very dangerous condition.  This condition may be caused by taking medicines or drugs that increase the level of serotonin in your body.  Treatment depends on the severity of  your symptoms. For mild cases, stopping the medicine or drug that caused your condition is usually all that is needed.  Check with your health care provider before you start taking any new prescriptions, over-the-counter medicines, herbs, or supplements. This information is not intended to replace advice given to you by your health care provider. Make sure you discuss any questions you have with your health care provider. Document Revised: 04/15/2017 Document Reviewed: 04/15/2017 Elsevier Patient Education  2020 [employer].

## 2020-01-30 ENCOUNTER — Encounter (HOSPITAL_BASED_OUTPATIENT_CLINIC_OR_DEPARTMENT_OTHER): Payer: Self-pay

## 2020-01-30 ENCOUNTER — Other Ambulatory Visit: Payer: Self-pay

## 2020-01-30 ENCOUNTER — Ambulatory Visit: Payer: Medicare Other | Admitting: Medical

## 2020-01-30 ENCOUNTER — Emergency Department (HOSPITAL_BASED_OUTPATIENT_CLINIC_OR_DEPARTMENT_OTHER)
Admission: EM | Admit: 2020-01-30 | Discharge: 2020-01-30 | Disposition: A | Discharge status: Home / Self Care | Payer: Medicare Other | Attending: Emergency Medicine | Admitting: Emergency Medicine

## 2020-01-30 DIAGNOSIS — Z7902 Long term (current) use of antithrombotics/antiplatelets: Secondary | ICD-10-CM | POA: Diagnosis not present

## 2020-01-30 DIAGNOSIS — K6289 Other specified diseases of anus and rectum: Secondary | ICD-10-CM | POA: Diagnosis present

## 2020-01-30 DIAGNOSIS — K5900 Constipation, unspecified: Secondary | ICD-10-CM | POA: Insufficient documentation

## 2020-01-30 DIAGNOSIS — Z79899 Other long term (current) drug therapy: Secondary | ICD-10-CM | POA: Diagnosis not present

## 2020-01-30 DIAGNOSIS — Z87891 Personal history of nicotine dependence: Secondary | ICD-10-CM | POA: Insufficient documentation

## 2020-01-30 DIAGNOSIS — I119 Hypertensive heart disease without heart failure: Secondary | ICD-10-CM | POA: Diagnosis not present

## 2020-01-30 DIAGNOSIS — I2510 Atherosclerotic heart disease of native coronary artery without angina pectoris: Secondary | ICD-10-CM | POA: Insufficient documentation

## 2020-01-30 DIAGNOSIS — J449 Chronic obstructive pulmonary disease, unspecified: Secondary | ICD-10-CM | POA: Diagnosis not present

## 2020-01-30 DIAGNOSIS — Z7952 Long term (current) use of systemic steroids: Secondary | ICD-10-CM | POA: Insufficient documentation

## 2020-01-30 DIAGNOSIS — I4891 Unspecified atrial fibrillation: Secondary | ICD-10-CM | POA: Diagnosis not present

## 2020-01-30 DIAGNOSIS — Z7983 Long term (current) use of bisphosphonates: Secondary | ICD-10-CM | POA: Diagnosis not present

## 2020-01-30 DIAGNOSIS — Z7901 Long term (current) use of anticoagulants: Secondary | ICD-10-CM | POA: Diagnosis not present

## 2020-01-30 MED ORDER — GLYCERIN (LAXATIVE) 2.1 G RE SUPP
1.0000 | Freq: Once | RECTAL | Status: AC
  Administered 2020-01-30: 1 via RECTAL
  Filled 2020-01-30: qty 1

## 2020-01-30 MED ORDER — HYDROCORTISONE ACETATE 25 MG RE SUPP: 25.0000 mg | Freq: Once | RECTAL | Status: DC

## 2020-01-30 NOTE — Discharge Instructions (Addendum)
Take colace 1 capsule daily. Drink plenty of water. Take miralax, 1 capful of powder mixed in drink, 1 to 3 times daily as needed for constipation. Use glycerin suppository or fleets enema for severe constipation.

## 2020-01-30 NOTE — ED Notes (Signed)
Pt on beside commode. Unable to get vitals at this time.

## 2020-01-30 NOTE — ED Notes (Signed)
Went in to administer suppository, pt sitting on commode

## 2020-01-30 NOTE — ED Triage Notes (Addendum)
Pt reports rectal pain since today along with blood in stools. Pt states his last BM was yesterday morning and he is having trouble urinating.

## 2020-01-30 NOTE — ED Notes (Signed)
Pt.'s spouse updated by phone.

## 2020-01-30 NOTE — ED Provider Notes (Signed)
[place] EMERGENCY DEPARTMENT Provider Note   CSN: [id number] Arrival date & time: 01/30/20  1010     History Chief Complaint  Patient presents with  . rectal pain    Robert Lang is a 69 y.o. male.  69yo M w/ PMH below including A fib on anticoagulation, COPD, CAD, alcohol abuse, HTN who p/w rectal pain, constipation, and difficulty urinating.  Patient states that he had a normal bowel movement yesterday.  Since this morning, he has felt like he needs to have a bowel movement but has not been able to do so.  Reports severe rectal pain and some rectal bleeding with attempts to defecate.  He states that his pain is mostly in his rectum, no significant abdominal pain but he does report difficulty urinating which he thinks is related to the constipation.  He has urinated once this morning.  He denies any vomiting, fevers, or recent illness.  He states that he had a similar problem in the past but was able to give himself a fleets enema with relief.  He tried a fleets enema this morning with no improvement.  Currently pain is severe.  The history is provided by the patient.       Past Medical History:  Diagnosis Date  . Allergy   . Anxiety   . Arthritis   . Atrial fibrillation ([place])   . CAD (coronary artery disease)   . COPD (chronic obstructive pulmonary disease) ([place])   . Depression   . Depression   . ETOH abuse 01/11/2019   6 pack of beer per day  . GERD (gastroesophageal reflux disease)   . Heart attack ([place])   . History of colon polyps   . Hyperlipidemia   . Hypertension   . Kidney stones   . Smoker 01/11/2019    Patient Active Problem List   Diagnosis Date Noted  . Acute ST elevation myocardial infarction (STEMI) of inferolateral wall ([place]) 01/10/2019  . Dyspnea on exertion 10/20/2018  . Status post ablation of atrial flutter 07/06/2018  . Coronary artery disease 07/06/2018  . Essential hypertension 07/06/2018  . Dyslipidemia, goal LDL below 70  07/06/2018  . Smoking 07/06/2018  . Neuropathy 07/06/2018  . Dizziness 10/28/2017  . Falls 10/28/2017  . Heart palpitations 01/27/2017  . Anticoagulated 07/01/2016  . H/O amiodarone therapy 07/01/2016  . PLMD (periodic limb movement disorder) 11/04/2015  . Obstructive sleep apnea 08/05/2015  . Atrial flutter ([place]) 06/26/2015  . Chest pain 06/26/2015  . COPD (chronic obstructive pulmonary disease) ([place]) 06/26/2015  . 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 Allergic rhinitis due to pollen 09/03/2013  . DDD (degenerative disc disease), cervical 09/03/2013  . Depressive disorder 09/03/2013  . Esophageal reflux 09/03/2013  . Neck pain 09/03/2013  . Polycythemia, secondary 09/03/2013  . Pure hypercholesterolemia 09/03/2013  . Screening for prostate cancer 09/03/2013    Past Surgical History:  Procedure Laterality Date  . ATRIAL FIBRILLATION ABLATION  10/2015  . BACK SURGERY    . CARDIOVERSION  2017  . CATARACT EXTRACTION Bilateral 2017   March and April 2017  . COLONOSCOPY  2018  . CORONARY STENT PLACEMENT  2012  . CORONARY/GRAFT ACUTE MI REVASCULARIZATION N/A 01/10/2019   Procedure: CORONARY/GRAFT ACUTE MI REVASCULARIZATION;  Surgeon: Leonie Man, MD;  Location: [facility];  Service: Cardiovascular;  Laterality: N/A;  . INGUINAL HERNIA REPAIR     over 20 years ago  . LEFT HEART CATH AND CORONARY ANGIOGRAPHY N/A 01/10/2019  Procedure: LEFT HEART CATH AND CORONARY ANGIOGRAPHY;  Surgeon: Leonie Man, MD;  Location: [facility];  Service: Cardiovascular;  Laterality: N/A;  . LUMBAR LAMINECTOMY Bilateral 04/05/2016   L2-L5   . VENTRAL HERNIA REPAIR  2018       Family History  Problem Relation Age of Onset  . Colon cancer Father   . Throat cancer Brother     Social History   Tobacco Use  . Smoking status: Former Smoker    Packs/day: 1.00    Years: 30.00    Pack years: 30.00  . Smokeless tobacco: Former Systems developer  . Tobacco comment:  01/10/2019  Vaping Use  . Vaping Use: Never used  Substance Use Topics  . Alcohol use: Yes    Comment: 8oz of wine a day  . Drug use: Never    Home Medications Prior to Admission medications   Medication Sig Start Date End Date Taking? Authorizing Provider  atorvastatin (LIPITOR) 80 MG tablet Take 1 tablet (80 mg total) by mouth daily at 6 PM. 10/26/19   Park Liter, MD  buPROPion (WELLBUTRIN XL) 300 MG 24 hr tablet Take 1 tablet (300 mg total) by mouth daily. 07/09/19   [name], Percell Miller, [name]  carvedilol (COREG) 3.125 MG tablet Take 1 tablet (3.125 mg total) by mouth 2 (two) times daily with a meal. 10/26/19   Park Liter, MD  Cholecalciferol (VITAMIN D-1000 MAX ST) 25 MCG (1000 UT) tablet Take 1,000 Units by mouth daily.     [provider]  clopidogrel (PLAVIX) 75 MG tablet Take 1 tablet (75 mg total) by mouth daily with breakfast. 01/13/19   Belva Crome, MD  enalapril (VASOTEC) 5 MG tablet Take 1 tablet (5 mg total) by mouth daily. 10/26/19   Park Liter, MD  famotidine (PEPCID) 40 MG tablet Take 1 tablet (40 mg total) by mouth daily. 03/19/19   Park Liter, MD  fexofenadine (ALLEGRA) 180 MG tablet Take 180 mg by mouth at bedtime.  04/09/08   [provider]  finasteride (PROSCAR) 5 MG tablet TAKE 1 TABLET BY MOUTH EVERY DAY 12/10/19   [name], Percell Miller, [name]  gabapentin (NEURONTIN) 100 MG capsule TAKE 2 TO 3 CAPSULES BY MOUTH DAILY AS NEEDED FOR PAIN 01/24/20   [name], Percell Miller, [name]  magic mouthwash w/lidocaine SOLN 5 ml po qid swish and spit. 01/25/20   [name], Percell Miller, [name]  Multiple Vitamins-Minerals (RA VISION-VITE PRESERVE PO) Take by mouth.    [provider]  nitroGLYCERIN (NITROSTAT) 0.4 MG SL tablet Place 1 tablet (0.4 mg total) under the tongue every 5 (five) minutes as needed for chest pain. 10/11/18   Park Liter, MD  Omega-3 Fatty Acids (FISH OIL) 1000 MG CAPS Take 1,000 mg by mouth.    [provider]    pantoprazole (PROTONIX) 40 MG tablet TAKE 1 TABLET(40 MG) BY MOUTH DAILY 10/31/19   Jackquline Denmark, MD  ranolazine (RANEXA) 1000 MG SR tablet Take 1 tablet (1,000 mg total) by mouth 2 (two) times daily. Take 500 mg (1/2 tablet) for 2 weeks then increase to 1000 mg (1 tablet) twice daily. 10/22/19   Revankar, Reita Cliche, MD  rivaroxaban (XARELTO) 20 MG TABS tablet TAKE 1 TABLET(20 MG) BY MOUTH DAILY WITH SUPPER 10/26/19   Park Liter, MD  sertraline (ZOLOFT) 100 MG tablet Take 150 mg by mouth at bedtime.  06/13/18   [provider]  solifenacin (VESICARE) 5 MG tablet Take 5 mg by mouth daily.  [provider]  tamsulosin (FLOMAX) 0.4 MG CAPS capsule TAKE 1 CAPSULE(0.4 MG) BY MOUTH DAILY 06/07/19   [name], Percell Miller, [name]  traZODone (DESYREL) 50 MG tablet Take 0.5-1 tablets (25-50 mg total) by mouth at bedtime as needed for sleep. 01/29/20   [name], Percell Miller, [name]  Turmeric 500 MG CAPS Take 1 capsule by mouth 2 (two) times daily.     [provider]  vitamin B-12 (CYANOCOBALAMIN) 1000 MCG tablet Take 1,000 mcg by mouth daily.    [provider]    Allergies    Naproxen  Review of Systems   Review of Systems All other systems reviewed and are negative except that which was mentioned in HPI  Physical Exam Updated Vital Signs BP (!) 143/72   Pulse 95   Temp 97.6 F (36.4 C) (Oral)   Resp 18   Ht 6' (1.829 m)   Wt 87.5 kg   SpO2 97%   BMI 26.18 kg/m   Physical Exam Vitals and nursing note reviewed. Exam conducted with a chaperone present.  Constitutional:      General: He is not in acute distress.    Appearance: He is well-developed.     Comments: In distress due to pain, moaning  HENT:     Head: Normocephalic and atraumatic.  Eyes:     Conjunctiva/sclera: Conjunctivae normal.  Cardiovascular:     Rate and Rhythm: Normal rate and regular rhythm.     Heart sounds: Normal heart sounds. No murmur heard.   Pulmonary:     Effort: Pulmonary effort is  normal.     Breath sounds: Normal breath sounds.  Abdominal:     General: Bowel sounds are normal. There is distension (mild).     Palpations: Abdomen is soft.     Tenderness: There is no abdominal tenderness.  Genitourinary:    Comments: Area of tenderness on lateral perianal skin without obvious fluctuance, no bleeding, brown soft stool low in rectal vault; no melena or hematochezia Musculoskeletal:     Cervical back: Neck supple.  Skin:    General: Skin is warm and dry.  Neurological:     Mental Status: He is alert and oriented to person, place, and time.     Comments: Fluent speech  Psychiatric:        Judgment: Judgment normal.     ED Results / Procedures / Treatments   Labs (all labs ordered are listed, but only abnormal results are displayed) Labs Reviewed - No data to display  EKG None  Radiology No results found.  Procedures Procedures (including critical care time)  Medications Ordered in ED Medications  hydrocortisone (ANUSOL-HC) suppository 25 mg (0 mg Rectal Hold 01/30/20 1109)  Glycerin (Adult) 2.1 g suppository 1 suppository (1 suppository Rectal Given 01/30/20 1114)    ED Course  I have reviewed the triage vital signs and the nursing notes.  Pertinent labs & imaging results that were available during my care of the patient were reviewed by me and considered in my medical decision making (see chart for details).    MDM Rules/Calculators/A&P                          No evidence of GI bleeding on exam, suspect hemorrhoid vs perianal abscess. Gave glycerin suppository and pt later had several large BMs in the ED, was also able to void normally. On reassessment, he stated he felt 100% better and denied complaints. Wife stated today's episode was  very similar to last week's that resolved at home. Recommended daily colace, miralax as needed w/ titration to effect, increased water intake, glycerin or fleets as needed for severe symptoms. Discussed that blood  he saw earlier may be related to hemorrhoid, skin breakdown, small anal fissure, or trauma from fleets enema at home. No ongoing bleeding currently.  Reviewed return precautions with the patient and his wife and they voiced understanding. Final Clinical Impression(s) / ED Diagnoses Final diagnoses:  Constipation, unspecified constipation type    Rx / DC Orders ED Discharge Orders    None       Saloma Cadena, Wenda Overland, MD 01/30/20 1308

## 2020-01-30 NOTE — ED Notes (Signed)
ED Provider at bedside. Assisted provider with rectal exam

## 2020-02-04 ENCOUNTER — Telehealth: Payer: Self-pay

## 2020-02-04 NOTE — Telephone Encounter (Signed)
Patient's Spouse, [name], called to give BP readings, 01/31/20 it was 106/66 02/01/20 it was 121/66 02/02/20 it was 121/72 02/03/20 (pt forgot to take reading) 02/04/20 it was 125/83.  Also, [name] reported patient has been sleeping good with taking the Trazadone. She has also noticed an improvement in his personality and he has been more interactive with family members as they visited this weekend.  He started out taking a whole tablet, but he cut back to a half tablet on Friday night and it is still working well at a half tablet.

## 2020-02-05 NOTE — Telephone Encounter (Signed)
Glad to hear sleeping better. Bp readings overall good. Prefer 882 range on systolic/top number rather than low 106 range. Majority ideal.

## 2020-02-26 ENCOUNTER — Other Ambulatory Visit: Payer: Self-pay | Admitting: Cardiology

## 2020-02-27 NOTE — Telephone Encounter (Signed)
Refill sent to pharmacy.   

## 2020-03-04 ENCOUNTER — Other Ambulatory Visit: Payer: Self-pay

## 2020-03-04 DIAGNOSIS — I480 Paroxysmal atrial fibrillation: Secondary | ICD-10-CM | POA: Insufficient documentation

## 2020-03-04 DIAGNOSIS — I219 Acute myocardial infarction, unspecified: Secondary | ICD-10-CM | POA: Insufficient documentation

## 2020-03-04 DIAGNOSIS — I10 Essential (primary) hypertension: Secondary | ICD-10-CM | POA: Insufficient documentation

## 2020-03-04 DIAGNOSIS — F419 Anxiety disorder, unspecified: Secondary | ICD-10-CM | POA: Insufficient documentation

## 2020-03-04 DIAGNOSIS — M1990 Unspecified osteoarthritis, unspecified site: Secondary | ICD-10-CM | POA: Insufficient documentation

## 2020-03-04 DIAGNOSIS — K219 Gastro-esophageal reflux disease without esophagitis: Secondary | ICD-10-CM | POA: Insufficient documentation

## 2020-03-04 DIAGNOSIS — F32A Depression, unspecified: Secondary | ICD-10-CM | POA: Insufficient documentation

## 2020-03-04 DIAGNOSIS — I4891 Unspecified atrial fibrillation: Secondary | ICD-10-CM | POA: Insufficient documentation

## 2020-03-04 DIAGNOSIS — I2510 Atherosclerotic heart disease of native coronary artery without angina pectoris: Secondary | ICD-10-CM | POA: Insufficient documentation

## 2020-03-04 DIAGNOSIS — N200 Calculus of kidney: Secondary | ICD-10-CM | POA: Insufficient documentation

## 2020-03-04 DIAGNOSIS — E785 Hyperlipidemia, unspecified: Secondary | ICD-10-CM | POA: Insufficient documentation

## 2020-03-04 DIAGNOSIS — T7840XA Allergy, unspecified, initial encounter: Secondary | ICD-10-CM | POA: Insufficient documentation

## 2020-03-04 DIAGNOSIS — Z86010 Personal history of colonic polyps: Secondary | ICD-10-CM | POA: Insufficient documentation

## 2020-03-05 ENCOUNTER — Encounter: Payer: Self-pay | Admitting: Cardiology

## 2020-03-05 ENCOUNTER — Other Ambulatory Visit: Payer: Self-pay

## 2020-03-05 ENCOUNTER — Ambulatory Visit (INDEPENDENT_AMBULATORY_CARE_PROVIDER_SITE_OTHER): Payer: Medicare Other | Admitting: Cardiology

## 2020-03-05 ENCOUNTER — Other Ambulatory Visit: Payer: Self-pay | Admitting: Medical

## 2020-03-05 VITALS — BP 80/56 | HR 83 | Ht 72.0 in | Wt 190.0 lb

## 2020-03-05 DIAGNOSIS — I483 Typical atrial flutter: Secondary | ICD-10-CM

## 2020-03-05 DIAGNOSIS — I2510 Atherosclerotic heart disease of native coronary artery without angina pectoris: Secondary | ICD-10-CM

## 2020-03-05 DIAGNOSIS — E785 Hyperlipidemia, unspecified: Secondary | ICD-10-CM

## 2020-03-05 DIAGNOSIS — Z9889 Other specified postprocedural states: Secondary | ICD-10-CM | POA: Diagnosis not present

## 2020-03-05 DIAGNOSIS — Z8679 Personal history of other diseases of the circulatory system: Secondary | ICD-10-CM

## 2020-03-05 MED ORDER — ENALAPRIL MALEATE 2.5 MG PO TABS: 2.5000 mg | ORAL_TABLET | Freq: Every day | ORAL | 1 refills | Status: DC

## 2020-03-05 NOTE — Patient Instructions (Signed)
Medication Instructions:  Your physician has recommended you make the following change in your medication:   DECREASE: Enalapril to 2.5 mg daily  *If you need a refill on your cardiac medications before your next appointment, please call your pharmacy*   Lab Work: Your physician recommends that you return for lab work today: pro bnp, bmp   If you have labs (blood work) drawn today and your tests are completely normal, you will receive your results only by: Marland Kitchen MyChart Message (if you have MyChart) OR . A paper copy in the mail If you have any lab test that is abnormal or we need to change your treatment, we will call you to review the results.   Testing/Procedures: Your physician has requested that you have an echocardiogram. Echocardiography is a painless test that uses sound waves to create images of your heart. It provides your doctor with information about the size and shape of your heart and how well your heart's chambers and valves are working. This procedure takes approximately one hour. There are no restrictions for this procedure.     Follow-Up: At [facility], you and your health needs are our priority.  As part of our continuing mission to provide you with exceptional heart care, we have created designated Provider Care Teams.  These Care Teams include your primary Cardiologist (physician) and Advanced Practice Providers (APPs -  Physician Assistants and Nurse Practitioners) who all work together to provide you with the care you need, when you need it.  We recommend signing up for the patient portal called "MyChart".  Sign up information is provided on this After Visit Summary.  MyChart is used to connect with patients for Virtual Visits (Telemedicine).  Patients are able to view lab/test results, encounter notes, upcoming appointments, etc.  Non-urgent messages can be sent to your provider as well.   To learn more about what you can do with MyChart, go to  [email or web address].    Your next appointment:   1 month(s)  The format for your next appointment:   In Person  Provider:   Jenne Campus, MD   Other Instructions   Echocardiogram An echocardiogram is a procedure that uses painless sound waves (ultrasound) to produce an image of the heart. Images from an echocardiogram can provide important information about:  Signs of coronary artery disease (CAD).  Aneurysm detection. An aneurysm is a weak or damaged part of an artery wall that bulges out from the normal force of blood pumping through the body.  Heart size and shape. Changes in the size or shape of the heart can be associated with certain conditions, including heart failure, aneurysm, and CAD.  Heart muscle function.  Heart valve function.  Signs of a past heart attack.  Fluid buildup around the heart.  Thickening of the heart muscle.  A tumor or infectious growth around the heart valves. Tell a health care provider about:  Any allergies you have.  All medicines you are taking, including vitamins, herbs, eye drops, creams, and over-the-counter medicines.  Any blood disorders you have.  Any surgeries you have had.  Any medical conditions you have.  Whether you are pregnant or may be pregnant. What are the risks? Generally, this is a safe procedure. However, problems may occur, including:  Allergic reaction to dye (contrast) that may be used during the procedure. What happens before the procedure? No specific preparation is needed. You may eat and drink normally. What happens during the procedure?   An IV tube may  be inserted into one of your veins.  You may receive contrast through this tube. A contrast is an injection that improves the quality of the pictures from your heart.  A gel will be applied to your chest.  A wand-like tool (transducer) will be moved over your chest. The gel will help to transmit the sound waves from the  transducer.  The sound waves will harmlessly bounce off of your heart to allow the heart images to be captured in real-time motion. The images will be recorded on a computer. The procedure may vary among health care providers and hospitals. What happens after the procedure?  You may return to your normal, everyday life, including diet, activities, and medicines, unless your health care provider tells you not to do that. Summary  An echocardiogram is a procedure that uses painless sound waves (ultrasound) to produce an image of the heart.  Images from an echocardiogram can provide important information about the size and shape of your heart, heart muscle function, heart valve function, and fluid buildup around your heart.  You do not need to do anything to prepare before this procedure. You may eat and drink normally.  After the echocardiogram is completed, you may return to your normal, everyday life, unless your health care provider tells you not to do that. This information is not intended to replace advice given to you by your health care provider. Make sure you discuss any questions you have with your health care provider. Document Revised: 06/29/2018 Document Reviewed: 04/10/2016 Elsevier Patient Education  [place].

## 2020-03-05 NOTE — Progress Notes (Signed)
Cardiology Office Note:    Date:  03/05/2020   ID:  Robert Lang, DOB 1950-09-09, MRN [id number]  PCP:  Mackie Pai, [name]  Cardiologist:  Jenne Campus, MD    Referring MD: Elise Benne   No chief complaint on file. Not doing well  History of Present Illness:    Robert Lang is a 69 y.o. male  with a hx of CAD s/p PCI to LAD/RCA, atrial flutter s/p ablation 2017, HLD, HTNlast seen while hospitalized.  LHC at [facility] 2017 with patent stent in prox LAD, 90% occlusion in small first diagonal artery and 25% occlusion of RCA w/ good EF 60%. Atrial flutter ablation at [facility] in 2017. Stress test 10/30/18 with no perfusion defect described as low risk study.   Presented to [place] ED 01/10/19 with chest pain, code STEMI called, transferred to [facility]. Troponin peaked at [phone]. Echo preserved LV function. Underwent cardiac cath 01/10/19 with PCI and DES to RCA. Recommended for triple therapy for 1 month then stop aspirin. 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 His diltiazem was switched to coreg while hospitalized. He was find to have some chest pain few weeks ago. After that stress test done on 10/24/2019 showing no evidence of ischemia. He comes today 2 months of follow-up.  Overall he is now doing well he said for last few months he gradually developed worsening of shortness of breath.  Denies have any chest pain tightness squeezing pressure burning chest.  Also recently he was in the emergency room because of palpitation.  He actually walked to the restroom and then went back to his examination when I walked in.  He was obviously short of breath.  There is no proximal nocturnal dyspnea there is no swelling of lower extremities.   Past Medical History:  Diagnosis Date  . Acute ST elevation myocardial infarction (STEMI) of inferolateral wall ([place]) 01/10/2019   . 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 M75.00)  . Allergic rhinitis due to pollen 09/03/2013   J30.1)  Formatting of this note might be different from the original. J30.1)  . Allergy   . Anticoagulated 07/01/2016  . Anxiety   . Arthritis   . Atrial fibrillation ([place])   . Atrial flutter ([place]) 06/26/2015  . CAD (coronary artery disease)   . Chest pain 06/26/2015  . COPD (chronic obstructive pulmonary disease) ([place])   . Coronary artery disease 07/06/2018   Cardiac catheterization 2017 showing 90% small diagonal branch disease  . DDD (degenerative disc disease), cervical 09/03/2013   M50.90)  Formatting of this note might be different from the original. M50.90)  . Depression   . Depression   . Depressive disorder 09/03/2013  . Dizziness 10/28/2017  . Dyslipidemia, goal LDL below 70 07/06/2018  . Dyspnea on exertion 10/20/2018  . Esophageal reflux 09/03/2013  . Essential hypertension 07/06/2018  . ETOH abuse 01/11/2019   6 pack of beer per day  . Falls 10/28/2017  . GERD (gastroesophageal reflux disease)   . H/O amiodarone therapy 07/01/2016  . Heart attack ([place])   . Heart palpitations 01/27/2017  . History of colon polyps   . Hyperlipidemia   . Hypertension   . Kidney stones   . Neck pain 09/03/2013  . Neuropathy 07/06/2018  . Obstructive sleep apnea 08/05/2015  . PLMD (periodic limb movement disorder) 11/04/2015  .  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 STORY: Due to alcohol/ tobacco  . Pure hypercholesterolemia 09/03/2013   E78.0)  Formatting of this note might be different from the original. E78.0)  . Screening for prostate cancer 09/03/2013  . Smoker 01/11/2019  . Smoking 07/06/2018  . Status post ablation of atrial flutter 07/06/2018   2017    Past Surgical History:  Procedure Laterality Date  . ATRIAL FIBRILLATION  ABLATION  10/2015  . BACK SURGERY    . CARDIOVERSION  2017  . CATARACT EXTRACTION Bilateral 2017   March and April 2017  . COLONOSCOPY  2018  . CORONARY STENT PLACEMENT  2012  . CORONARY/GRAFT ACUTE MI REVASCULARIZATION N/A 01/10/2019   Procedure: CORONARY/GRAFT ACUTE MI REVASCULARIZATION;  Surgeon: Leonie Man, MD;  Location: [place] [facility];  Service: Cardiovascular;  Laterality: N/A;  . INGUINAL HERNIA REPAIR     over 20 years ago  . LEFT HEART CATH AND CORONARY ANGIOGRAPHY N/A 01/10/2019   Procedure: LEFT HEART CATH AND CORONARY ANGIOGRAPHY;  Surgeon: Leonie Man, MD;  Location: [place] [facility];  Service: Cardiovascular;  Laterality: N/A;  . LUMBAR LAMINECTOMY Bilateral 04/05/2016   L2-L5   . VENTRAL HERNIA REPAIR  2018    Current Medications: No outpatient medications have been marked as taking for the 03/05/20 encounter (Appointment) with Park Liter, MD.     Allergies:   Naproxen   Social History   Socioeconomic History  . Marital status: Married    Spouse name: Not on file  . Number of children: Not on file  . Years of education: Not on file  . Highest education level: Not on file  Occupational History  . Not on file  Tobacco Use  . Smoking status: Former Smoker    Packs/day: 1.00    Years: 30.00    Pack years: 30.00  . Smokeless tobacco: Former Systems developer  . Tobacco comment: 01/10/2019  Vaping Use  . Vaping Use: Never used  Substance and Sexual Activity  . Alcohol use: Yes    Comment: 8oz of wine a day  . Drug use: Never  . Sexual activity: Not on file  Other Topics Concern  . Not on file  Social History Narrative  . Not on file   Social Determinants of Health   Financial Resource Strain: Not on file  Food Insecurity: Not on file  Transportation Needs: Not on file  Physical Activity: Not on file  Stress: Not on file  Social Connections: Not on file     Family History: The patient's family history includes Colon cancer in his  father; Throat cancer in his brother. ROS:   Please see the history of present illness.    All 14 point review of systems negative except as described per history of present illness  EKGs/Labs/Other Studies Reviewed:      Recent Labs: 01/25/2020: ALT 21; BUN 13; Creat 0.95; Hemoglobin 14.7; Platelets 237; Potassium 4.3; Sodium 136  Recent Lipid Panel    Component Value Date/Time   CHOL 112 10/26/2019 1152   TRIG 165 (H) 10/26/2019 1152   HDL 45 10/26/2019 1152   CHOLHDL 2.5 10/26/2019 1152   CHOLHDL 3.4 01/10/2019 1328   VLDL 43 (H) 01/10/2019 1328   LDLCALC 40 10/26/2019 1152   LDLDIRECT 66.0 09/06/2018 0924    Physical Exam:    VS:  There were no vitals taken for this  visit.    Wt Readings from Last 3 Encounters:  01/30/20 193 lb (87.5 kg)  01/29/20 193 lb 9.6 oz (87.8 kg)  01/25/20 192 lb 12.8 oz (87.5 kg)     GEN:  Well nourished, well developed in no acute distress HEENT: Normal NECK: No JVD; No carotid bruits LYMPHATICS: No lymphadenopathy CARDIAC: RRR, no murmurs, no rubs, no gallops RESPIRATORY:  Clear to auscultation without rales, wheezing or rhonchi  ABDOMEN: Soft, non-tender, non-distended MUSCULOSKELETAL:  No edema; No deformity  SKIN: Warm and dry LOWER EXTREMITIES: no swelling NEUROLOGIC:  Alert and oriented x 3 PSYCHIATRIC:  Normal affect   ASSESSMENT:    1. Coronary artery disease involving native coronary artery of native heart without angina pectoris   2. Typical atrial flutter (HCC)   3. Status post ablation of atrial flutter   4. Dyslipidemia, goal LDL below 70    PLAN:    In order of problems listed above:  1. Coronary disease however he does have some issues going on.  I will ask him to have an echocardiogram to assess left ventricle ejection fraction, proBNP Chem-7 will be done today.  He also complained of having dizziness when he is getting up very quickly and his blood pressure is low I will cut down his Vasotec to only 2.5  daily, 2. History of typical atrial flutter doing well from that point review status post ablation, his EKG today shows sinus rhythm with first-degree AV block, I will continue present management including anticoagulation. 3. Dyslipidemia: He is taking statin which I will continue.  I did review his lab work test from 10/26/2019 showing LDL of 40 HDL 45.  We will continue present management which include high intensity statin. 4. Overall he does have progressive shortness of breath which is probably multifactorial and to rule out cardiac aspirin.  Echocardiogram will be done proBNP Chem-7.  We will modify his medications.   Medication Adjustments/Labs and Tests Ordered: Current medicines are reviewed at length with the patient today.  Concerns regarding medicines are outlined above.  No orders of the defined types were placed in this encounter.  Medication changes: No orders of the defined types were placed in this encounter.   Signed, Park Liter, MD, [facility] 03/05/2020 1:14 PM    [place]

## 2020-03-06 LAB — BASIC METABOLIC PANEL
BUN/Creatinine Ratio: 13 (ref 10–24)
BUN: 14 mg/dL (ref 8–27)
CO2: 23 mmol/L (ref 20–29)
Calcium: 9.3 mg/dL (ref 8.6–10.2)
Chloride: 102 mmol/L (ref 96–106)
Creatinine, Ser: 1.07 mg/dL (ref 0.76–1.27)
GFR calc Af Amer: 81 mL/min/{1.73_m2} (ref >59)
GFR calc non Af Amer: 70 mL/min/{1.73_m2} (ref >59)
Glucose: 88 mg/dL (ref 65–99)
Potassium: 4.9 mmol/L (ref 3.5–5.2)
Sodium: 137 mmol/L (ref 134–144)

## 2020-03-06 LAB — PRO B NATRIURETIC PEPTIDE: NT-Pro BNP: 109 pg/mL (ref 0–376)

## 2020-03-12 ENCOUNTER — Other Ambulatory Visit: Payer: Self-pay

## 2020-03-12 ENCOUNTER — Ambulatory Visit (HOSPITAL_COMMUNITY)
Admission: RE | Admit: 2020-03-12 | Discharge: 2020-03-12 | Disposition: A | Discharge status: Home / Self Care | Payer: Medicare Other | Source: Ambulatory Visit | Attending: Cardiology | Admitting: Cardiology

## 2020-03-12 DIAGNOSIS — Z9889 Other specified postprocedural states: Secondary | ICD-10-CM | POA: Diagnosis not present

## 2020-03-12 DIAGNOSIS — Z8679 Personal history of other diseases of the circulatory system: Secondary | ICD-10-CM | POA: Insufficient documentation

## 2020-03-12 DIAGNOSIS — I10 Essential (primary) hypertension: Secondary | ICD-10-CM | POA: Insufficient documentation

## 2020-03-12 DIAGNOSIS — E785 Hyperlipidemia, unspecified: Secondary | ICD-10-CM | POA: Diagnosis not present

## 2020-03-12 DIAGNOSIS — J449 Chronic obstructive pulmonary disease, unspecified: Secondary | ICD-10-CM | POA: Diagnosis not present

## 2020-03-12 DIAGNOSIS — I483 Typical atrial flutter: Secondary | ICD-10-CM | POA: Insufficient documentation

## 2020-03-12 DIAGNOSIS — I2510 Atherosclerotic heart disease of native coronary artery without angina pectoris: Secondary | ICD-10-CM | POA: Diagnosis not present

## 2020-03-12 DIAGNOSIS — I4891 Unspecified atrial fibrillation: Secondary | ICD-10-CM | POA: Diagnosis not present

## 2020-03-12 LAB — ECHOCARDIOGRAM COMPLETE
Area-P 1/2: 3.6 cm2
Calc EF: 58.8 %
S' Lateral: 3 cm
Single Plane A2C EF: 56.7 %
Single Plane A4C EF: 59.1 %

## 2020-03-12 NOTE — Progress Notes (Signed)
  Echocardiogram 2D Echocardiogram has been performed.  Jennette Dubin 03/12/2020, 8:50 AM

## 2020-03-18 ENCOUNTER — Telehealth: Payer: Self-pay

## 2020-03-18 NOTE — Telephone Encounter (Signed)
-----   Message from Georgeanna Lea, MD sent at 03/17/2020  9:40 AM EST ----- Echocardiogram showed preserved left ventricle ejection fraction, trace MR, overall looks good

## 2020-03-18 NOTE — Telephone Encounter (Signed)
Spoke with patient regarding results and recommendation.  Patient verbalizes understanding and is agreeable to plan of care. Advised patient to call back with any issues or concerns.  

## 2020-03-28 DIAGNOSIS — F4322 Adjustment disorder with anxiety: Secondary | ICD-10-CM | POA: Diagnosis not present

## 2020-03-28 DIAGNOSIS — F3342 Major depressive disorder, recurrent, in full remission: Secondary | ICD-10-CM | POA: Diagnosis not present

## 2020-04-01 ENCOUNTER — Other Ambulatory Visit: Payer: Self-pay

## 2020-04-01 ENCOUNTER — Other Ambulatory Visit: Payer: Self-pay | Admitting: Medical

## 2020-04-01 ENCOUNTER — Other Ambulatory Visit: Payer: Self-pay | Admitting: Cardiology

## 2020-04-01 DIAGNOSIS — H524 Presbyopia: Secondary | ICD-10-CM | POA: Diagnosis not present

## 2020-04-01 DIAGNOSIS — H31011 Macula scars of posterior pole (postinflammatory) (post-traumatic), right eye: Secondary | ICD-10-CM | POA: Diagnosis not present

## 2020-04-03 ENCOUNTER — Other Ambulatory Visit: Payer: Self-pay

## 2020-04-03 ENCOUNTER — Encounter: Payer: Self-pay | Admitting: Cardiology

## 2020-04-03 ENCOUNTER — Ambulatory Visit: Payer: Medicare Other | Admitting: Cardiology

## 2020-04-03 VITALS — BP 116/74 | HR 86 | Ht 71.0 in | Wt 192.0 lb

## 2020-04-03 DIAGNOSIS — I2510 Atherosclerotic heart disease of native coronary artery without angina pectoris: Secondary | ICD-10-CM | POA: Diagnosis not present

## 2020-04-03 DIAGNOSIS — R0602 Shortness of breath: Secondary | ICD-10-CM

## 2020-04-03 DIAGNOSIS — E782 Mixed hyperlipidemia: Secondary | ICD-10-CM

## 2020-04-03 DIAGNOSIS — I10 Essential (primary) hypertension: Secondary | ICD-10-CM | POA: Diagnosis not present

## 2020-04-03 DIAGNOSIS — J432 Centrilobular emphysema: Secondary | ICD-10-CM

## 2020-04-03 NOTE — Progress Notes (Signed)
Cardiology Office Note:    Date:  04/03/2020   ID:  Robert Lang, DOB [birth year], MRN [id number]  PCP:  Mackie Pai, [name]  Cardiologist:  Jenne Campus, MD    Referring MD: Elise Benne   No chief complaint on file. Doing well but still short of breath  History of Present Illness:    Robert Lang is a 70 y.o. male   with a hx of CAD s/p PCI to LAD/RCA, atrial flutter s/p ablation 2017, HLD, HTNlast seen while hospitalized.  LHC at [facility] 2017 with patent stent in prox LAD, 90% occlusion in small first diagonal artery and 25% occlusion of RCA w/ good EF 60%. Atrial flutter ablation at [facility] in 2017. Stress test 10/30/18 with no perfusion defect described as low risk study.   Presented to [place] ED 01/10/19 with chest pain, code STEMI called, transferred to [facility]. Troponin peaked at [phone]. Echo preserved LV function. Underwent cardiac cath 01/10/19 with PCI and DES to RCA. Recommended for triple therapy for 1 month then stop aspirin. 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 His diltiazem was switched to coreg while hospitalized. He was find to have some chest pain few weeks ago. After that stress test done on 10/24/2019 showing no evidence of ischemia. He comes today 2 months of follow-up overall shortness of breath is biggest problem which is graduallyGetting worse.  We did cardiac work-up trying to figure out if this is related to his heart does not look that is the case.Stress test was negative, echocardiogram showed preserved ejection fraction proBNP is normal.He denies have any chest pain tightness squeezing pressure burning chest no swelling of lower extremities.  Past Medical History:  Diagnosis Date  . Acute ST elevation myocardial infarction (STEMI) of inferolateral wall ([place]) 01/10/2019  . 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 M75.00)  . Allergic rhinitis due to pollen 09/03/2013   J30.1)  Formatting of this note might be different from the original. J30.1)  . Allergy   . Anticoagulated 07/01/2016  . Anxiety   . Arthritis   . Atrial fibrillation ([place])   . Atrial flutter ([place]) 06/26/2015  . CAD (coronary artery disease)   . Chest pain 06/26/2015  . COPD (chronic obstructive pulmonary disease) ([place])   . Coronary artery disease 07/06/2018   Cardiac catheterization 2017 showing 90% small diagonal branch disease  . DDD (degenerative disc disease), cervical 09/03/2013   M50.90)  Formatting of this note might be different from the original. M50.90)  . Depression   . Depression   . Depressive disorder 09/03/2013  . Dizziness 10/28/2017  . Dyslipidemia, goal LDL below 70 07/06/2018  . Dyspnea on exertion 10/20/2018  . Esophageal reflux 09/03/2013  . Essential hypertension 07/06/2018  . ETOH abuse 01/11/2019   6 pack of beer per day  . Falls 10/28/2017  . GERD (gastroesophageal reflux disease)   . H/O amiodarone therapy 07/01/2016  . Heart attack ([place])   . Heart palpitations 01/27/2017  . History of colon polyps   . Hyperlipidemia   . Hypertension   . Kidney stones   . Neck pain 09/03/2013  . Neuropathy 07/06/2018  . Obstructive sleep apnea 08/05/2015  . PLMD (periodic limb movement disorder) 11/04/2015  . 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 STORY: Due to alcohol/ tobacco  . Pure hypercholesterolemia 09/03/2013   E78.0)  Formatting of this note might be different from the original. E78.0)  . Screening for prostate cancer 09/03/2013  . Smoker 01/11/2019  . Smoking 07/06/2018  . Status post ablation of atrial flutter 07/06/2018   2017    Past Surgical History:  Procedure Laterality Date  . ATRIAL FIBRILLATION ABLATION  10/2015  . BACK SURGERY    . CARDIOVERSION  2017  .  CATARACT EXTRACTION Bilateral 2017   March and April 2017  . COLONOSCOPY  2018  . CORONARY STENT PLACEMENT  2012  . CORONARY/GRAFT ACUTE MI REVASCULARIZATION N/A 01/10/2019   Procedure: CORONARY/GRAFT ACUTE MI REVASCULARIZATION;  Surgeon: Leonie Man, MD;  Location: [place] [facility];  Service: Cardiovascular;  Laterality: N/A;  . INGUINAL HERNIA REPAIR     over 20 years ago  . LEFT HEART CATH AND CORONARY ANGIOGRAPHY N/A 01/10/2019   Procedure: LEFT HEART CATH AND CORONARY ANGIOGRAPHY;  Surgeon: Leonie Man, MD;  Location: [place] [facility];  Service: Cardiovascular;  Laterality: N/A;  . LUMBAR LAMINECTOMY Bilateral 04/05/2016   L2-L5   . VENTRAL HERNIA REPAIR  2018    Current Medications: Current Meds  Medication Sig  . aspirin EC 81 MG tablet Take 81 mg by mouth daily. Swallow whole.  Marland Kitchen atorvastatin (LIPITOR) 80 MG tablet Take 1 tablet (80 mg total) by mouth daily at 6 PM.  . buPROPion (WELLBUTRIN XL) 300 MG 24 hr tablet Take 1 tablet (300 mg total) by mouth daily.  . carvedilol (COREG) 3.125 MG tablet Take 1 tablet (3.125 mg total) by mouth 2 (two) times daily with a meal.  . Cholecalciferol 25 MCG (1000 UT) tablet Take 1,000 Units by mouth daily.   . colchicine 0.6 MG tablet Take 0.6 mg by mouth daily.  Marland Kitchen diltiazem (CARDIZEM) 30 MG tablet Take 30 mg by mouth daily. Unknown strengh  . enalapril (VASOTEC) 2.5 MG tablet Take 1 tablet (2.5 mg total) by mouth daily.  . enalapril (VASOTEC) 5 MG tablet Take 5 mg by mouth daily.  . famotidine (PEPCID) 40 MG tablet TAKE 1 TABLET(40 MG) BY MOUTH DAILY  . fexofenadine (ALLEGRA) 180 MG tablet Take 180 mg by mouth at bedtime.   . finasteride (PROSCAR) 5 MG tablet TAKE 1 TABLET BY MOUTH EVERY DAY  . gabapentin (NEURONTIN) 100 MG capsule TAKE 2 TO 3 CAPSULES BY MOUTH DAILY AS NEEDED FOR PAIN  . nitroGLYCERIN (NITROSTAT) 0.4 MG SL tablet Place 1 tablet (0.4 mg total) under the tongue every 5 (five) minutes as needed for chest pain.   . Omega-3 Fatty Acids (FISH OIL) 1000 MG CAPS Take 1,000 mg by mouth.  . ranolazine (RANEXA) 1000 MG SR tablet Take 1 tablet (1,000 mg total) by mouth 2 (two) times daily.  . rivaroxaban (XARELTO) 20 MG TABS tablet TAKE 1 TABLET(20 MG) BY MOUTH DAILY WITH SUPPER  . sertraline (ZOLOFT) 100 MG tablet Take 150 mg by mouth at bedtime.   . solifenacin (VESICARE) 5 MG tablet Take 5 mg by mouth daily.  . tamsulosin (FLOMAX) 0.4 MG CAPS capsule TAKE 1 CAPSULE(0.4 MG) BY MOUTH DAILY  . traZODone (DESYREL) 50 MG tablet Take 0.5-1 tablets (25-50 mg total) by mouth at bedtime as needed for sleep.  . Turmeric 500 MG CAPS Take 1 capsule by mouth 2 (two) times daily.   . vitamin B-12 (CYANOCOBALAMIN) 1000 MCG tablet Take 1,000 mcg by mouth daily.  Allergies:   Naproxen   Social History   Socioeconomic History  . Marital status: Married    Spouse name: Not on file  . Number of children: Not on file  . Years of education: Not on file  . Highest education level: Not on file  Occupational History  . Not on file  Tobacco Use  . Smoking status: Former Smoker    Packs/day: 1.00    Years: 30.00    Pack years: 30.00  . Smokeless tobacco: Former Systems developer  . Tobacco comment: 01/10/2019  Vaping Use  . Vaping Use: Never used  Substance and Sexual Activity  . Alcohol use: Yes    Comment: 8oz of wine a day  . Drug use: Never  . Sexual activity: Not on file  Other Topics Concern  . Not on file  Social History Narrative  . Not on file   Social Determinants of Health   Financial Resource Strain: Not on file  Food Insecurity: Not on file  Transportation Needs: Not on file  Physical Activity: Not on file  Stress: Not on file  Social Connections: Not on file     Family History: The patient's family history includes Colon cancer in his father; Throat cancer in his brother. ROS:   Please see the history of present illness.    All 14 point review of systems negative except as described per history  of present illness  EKGs/Labs/Other Studies Reviewed:      Recent Labs: 01/25/2020: ALT 21; Hemoglobin 14.7; Platelets 237 03/05/2020: BUN 14; Creatinine, Ser 1.07; NT-Pro BNP 109; Potassium 4.9; Sodium 137  Recent Lipid Panel    Component Value Date/Time   CHOL 112 10/26/2019 1152   TRIG 165 (H) 10/26/2019 1152   HDL 45 10/26/2019 1152   CHOLHDL 2.5 10/26/2019 1152   CHOLHDL 3.4 01/10/2019 1328   VLDL 43 (H) 01/10/2019 1328   LDLCALC 40 10/26/2019 1152   LDLDIRECT 66.0 09/06/2018 0924    Physical Exam:    VS:  BP 116/74 (BP Location: Right Arm, Patient Position: Sitting)   Pulse 86   Ht 5\' 11"  (1.803 m)   Wt 192 lb (87.1 kg)   SpO2 94%   BMI 26.78 kg/m     Wt Readings from Last 3 Encounters:  04/03/20 192 lb (87.1 kg)  03/05/20 190 lb (86.2 kg)  01/30/20 193 lb (87.5 kg)     GEN:  Well nourished, well developed in no acute distress HEENT: Normal NECK: No JVD; No carotid bruits LYMPHATICS: No lymphadenopathy CARDIAC: RRR, no murmurs, no rubs, no gallops RESPIRATORY:  Clear to auscultation without rales, wheezing or rhonchi  ABDOMEN: Soft, non-tender, non-distended MUSCULOSKELETAL:  No edema; No deformity  SKIN: Warm and dry LOWER EXTREMITIES: no swelling NEUROLOGIC:  Alert and oriented x 3 PSYCHIATRIC:  Normal affect   ASSESSMENT:    1. Coronary artery disease involving native coronary artery of native heart without angina pectoris   2. Essential hypertension   3. Centrilobular emphysema ([place])   4. Mixed hyperlipidemia    PLAN:    In order of problems listed above:  1. 1. Coronary disease stable from that point review on appropriate medications which I will continue. 2. 2.  Shortness of breath which is progressive.  He does have emphysema/COPD he will be referred to pulmonary. 3. 3.  Dyslipidemia last fasting lipid profile from 10/26/2018 1K PN show me LDL of 40.  He is on statin which I will continue, I will also ask him  to have fasting lipid profile done  today. 4. 4.  History of paroxysmal atrial flutter status post ablation he is anticoagulant which I will continue.   Medication Adjustments/Labs and Tests Ordered: Current medicines are reviewed at length with the patient today.  Concerns regarding medicines are outlined above.  No orders of the defined types were placed in this encounter.  Medication changes: No orders of the defined types were placed in this encounter.   Signed, Park Liter, MD, [facility] 04/03/2020 9:41 AM    [place]

## 2020-04-03 NOTE — Patient Instructions (Signed)
Medication Instructions:  Your physician recommends that you continue on your current medications as directed. Please refer to the Current Medication list given to you today.  *If you need a refill on your cardiac medications before your next appointment, please call your pharmacy*   Lab Work: Your physician recommends that you return for lab work today: lipid  If you have labs (blood work) drawn today and your tests are completely normal, you will receive your results only by: . MyChart Message (if you have MyChart) OR . A paper copy in the mail If you have any lab test that is abnormal or we need to change your treatment, we will call you to review the results.   Testing/Procedures: None   Follow-Up: At [facility], you and your health needs are our priority.  As part of our continuing mission to provide you with exceptional heart care, we have created designated Provider Care Teams.  These Care Teams include your primary Cardiologist (physician) and Advanced Practice Providers (APPs -  Physician Assistants and Nurse Practitioners) who all work together to provide you with the care you need, when you need it.  We recommend signing up for the patient portal called "MyChart".  Sign up information is provided on this After Visit Summary.  MyChart is used to connect with patients for Virtual Visits (Telemedicine).  Patients are able to view lab/test results, encounter notes, upcoming appointments, etc.  Non-urgent messages can be sent to your provider as well.   To learn more about what you can do with MyChart, go to [email or web address].com.    Your next appointment:   5 month(s)  The format for your next appointment:   In Person  Provider:   Robert Krasowski, MD   Other Instructions    

## 2020-04-03 NOTE — Addendum Note (Signed)
Addended by: Senaida Ores on: 04/03/2020 09:47 AM   Modules accepted: Orders

## 2020-04-04 ENCOUNTER — Telehealth: Payer: Self-pay

## 2020-04-04 LAB — LIPID PANEL
Chol/HDL Ratio: 3.2 ratio (ref 0.0–5.0)
Cholesterol, Total: 128 mg/dL (ref 100–199)
HDL: 40 mg/dL (ref >39)
LDL Chol Calc (NIH): 58 mg/dL (ref 0–99)
Triglycerides: 180 mg/dL — ABNORMAL HIGH (ref 0–149)
VLDL Cholesterol Cal: 30 mg/dL (ref 5–40)

## 2020-04-04 NOTE — Telephone Encounter (Signed)
Patient advised of results and verbalized understanding.  

## 2020-04-04 NOTE — Telephone Encounter (Signed)
-----   Message from Park Liter, MD sent at 04/04/2020  9:08 AM EST ----- Cholesterol good, continue present management

## 2020-04-22 ENCOUNTER — Ambulatory Visit: Payer: Medicare Other | Admitting: Cardiology

## 2020-04-23 ENCOUNTER — Other Ambulatory Visit: Payer: Self-pay | Admitting: Cardiology

## 2020-04-23 ENCOUNTER — Other Ambulatory Visit: Payer: Self-pay | Admitting: Medical

## 2020-04-25 ENCOUNTER — Other Ambulatory Visit: Payer: Self-pay | Admitting: Gastroenterology

## 2020-04-30 ENCOUNTER — Other Ambulatory Visit: Payer: Self-pay

## 2020-04-30 ENCOUNTER — Ambulatory Visit (INDEPENDENT_AMBULATORY_CARE_PROVIDER_SITE_OTHER): Payer: Medicare Other | Admitting: Medical

## 2020-04-30 VITALS — BP 91/53 | HR 93 | Resp 18 | Ht 71.0 in | Wt 191.0 lb

## 2020-04-30 DIAGNOSIS — J432 Centrilobular emphysema: Secondary | ICD-10-CM | POA: Diagnosis not present

## 2020-04-30 DIAGNOSIS — G629 Polyneuropathy, unspecified: Secondary | ICD-10-CM

## 2020-04-30 DIAGNOSIS — I480 Paroxysmal atrial fibrillation: Secondary | ICD-10-CM

## 2020-04-30 DIAGNOSIS — Z87438 Personal history of other diseases of male genital organs: Secondary | ICD-10-CM

## 2020-04-30 DIAGNOSIS — G4700 Insomnia, unspecified: Secondary | ICD-10-CM

## 2020-04-30 DIAGNOSIS — I959 Hypotension, unspecified: Secondary | ICD-10-CM

## 2020-04-30 LAB — COMPREHENSIVE METABOLIC PANEL WITH GFR
ALT: 20 U/L (ref 0–53)
AST: 16 U/L (ref 0–37)
Albumin: 4.2 g/dL (ref 3.5–5.2)
Alkaline Phosphatase: 61 U/L (ref 39–117)
BUN: 17 mg/dL (ref 6–23)
CO2: 28 meq/L (ref 19–32)
Calcium: 9.8 mg/dL (ref 8.4–10.5)
Chloride: 102 meq/L (ref 96–112)
Creatinine, Ser: 0.95 mg/dL (ref 0.40–1.50)
GFR: 81.33 mL/min
Glucose, Bld: 93 mg/dL (ref 70–99)
Potassium: 5 meq/L (ref 3.5–5.1)
Sodium: 135 meq/L (ref 135–145)
Total Bilirubin: 0.6 mg/dL (ref 0.2–1.2)
Total Protein: 7.1 g/dL (ref 6.0–8.3)

## 2020-04-30 LAB — CBC WITH DIFFERENTIAL/PLATELET
Basophils Absolute: 0 10*3/uL (ref 0.0–0.1)
Basophils Relative: 0.6 % (ref 0.0–3.0)
Eosinophils Absolute: 0.1 10*3/uL (ref 0.0–0.7)
Eosinophils Relative: 0.8 % (ref 0.0–5.0)
HCT: 44.5 % (ref 39.0–52.0)
Hemoglobin: 15.3 g/dL (ref 13.0–17.0)
Lymphocytes Relative: 13.7 % (ref 12.0–46.0)
Lymphs Abs: 1.2 10*3/uL (ref 0.7–4.0)
MCHC: 34.3 g/dL (ref 30.0–36.0)
MCV: 100.9 fl — ABNORMAL HIGH (ref 78.0–100.0)
Monocytes Absolute: 0.9 10*3/uL (ref 0.1–1.0)
Monocytes Relative: 11.3 % (ref 3.0–12.0)
Neutro Abs: 6.2 10*3/uL (ref 1.4–7.7)
Neutrophils Relative %: 73.6 % (ref 43.0–77.0)
Platelets: 223 10*3/uL (ref 150.0–400.0)
RBC: 4.42 Mil/uL (ref 4.22–5.81)
RDW: 12.8 % (ref 11.5–15.5)
WBC: 8.4 10*3/uL (ref 4.0–10.5)

## 2020-04-30 MED ORDER — FINASTERIDE 5 MG PO TABS: 5.0000 mg | ORAL_TABLET | Freq: Every day | ORAL | 1 refills | Status: DC

## 2020-04-30 MED ORDER — RANOLAZINE ER 1000 MG PO TB12: 1000.0000 mg | ORAL_TABLET | Freq: Two times a day (BID) | ORAL | 1 refills | Status: DC

## 2020-04-30 MED ORDER — GABAPENTIN 100 MG PO CAPS: ORAL_CAPSULE | ORAL | 0 refills | Status: DC

## 2020-04-30 NOTE — Progress Notes (Signed)
Subjective:    Patient ID: Robert Lang, male    DOB: 1951-01-18, 70 y.o.   MRN: [id number]  HPI Pt in for follow up.  Pt last 2 visits showed low bp with me. When he saw cardiologist his blood pressure was low. Pt cardiologist did cut his vasotec down to 2.5 mg daily down from 5 mg daily. Pt also on coreg 3.125 mg twice daily. Pt reports he does feel dizzy when standing up.  Hx of high cholesterol and is on atorvastatin 80 mg daily.   Pt is seeing pulmonologist tomorrow.  Pt has insomnia. He is on trazadone 50 mg at night. Pt goes to bed at 8:30.  He also has hx of neuropathy. Notes sometimes nerve pain at night wakes him up. He take 200 mg at night.    Review of Systems  Constitutional: Negative for chills, fatigue and fever.  HENT: Negative for congestion.   Respiratory: Negative for cough, chest tightness, shortness of breath and wheezing.   Cardiovascular: Negative for chest pain.  Gastrointestinal: Negative for abdominal pain, blood in stool, constipation and diarrhea.  Genitourinary: Negative for dysuria.  Skin: Negative for rash.  Neurological: Positive for dizziness.       When stands gets light headed briefly.  Lower ext neuropathy.  Hematological: Negative for adenopathy. Does not bruise/bleed easily.  Psychiatric/Behavioral: Positive for sleep disturbance. Negative for behavioral problems, confusion and suicidal ideas. The patient is not nervous/anxious.     Past Medical History:  Diagnosis Date  . Acute ST elevation myocardial infarction (STEMI) of inferolateral wall ([place]) 01/10/2019  . 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 M75.00)  . Allergic rhinitis due to pollen 09/03/2013   J30.1)  Formatting of this note might be different from the original. J30.1)  . Allergy   . Anticoagulated 07/01/2016  . Anxiety   . Arthritis   . Atrial fibrillation ([place])   . Atrial flutter ([place])  06/26/2015  . CAD (coronary artery disease)   . Chest pain 06/26/2015  . COPD (chronic obstructive pulmonary disease) ([place])   . Coronary artery disease 07/06/2018   Cardiac catheterization 2017 showing 90% small diagonal branch disease  . DDD (degenerative disc disease), cervical 09/03/2013   M50.90)  Formatting of this note might be different from the original. M50.90)  . Depression   . Depression   . Depressive disorder 09/03/2013  . Dizziness 10/28/2017  . Dyslipidemia, goal LDL below 70 07/06/2018  . Dyspnea on exertion 10/20/2018  . Esophageal reflux 09/03/2013  . Essential hypertension 07/06/2018  . ETOH abuse 01/11/2019   6 pack of beer per day  . Falls 10/28/2017  . GERD (gastroesophageal reflux disease)   . H/O amiodarone therapy 07/01/2016  . Heart attack ([place])   . Heart palpitations 01/27/2017  . History of colon polyps   . Hyperlipidemia   . Hypertension   . Kidney stones   . Neck pain 09/03/2013  . Neuropathy 07/06/2018  . Obstructive sleep apnea 08/05/2015  . PLMD (periodic limb movement disorder) 11/04/2015  . 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 STORY: Due to alcohol/ tobacco  . Pure hypercholesterolemia 09/03/2013   E78.0)  Formatting of this note might be different from the original. E78.0)  . Screening for prostate cancer 09/03/2013  . Smoker 01/11/2019  . Smoking 07/06/2018  . Status post ablation  of atrial flutter 07/06/2018   2017     Social History   Socioeconomic History  . Marital status: Married    Spouse name: Not on file  . Number of children: Not on file  . Years of education: Not on file  . Highest education level: Not on file  Occupational History  . Not on file  Tobacco Use  . Smoking status: Former Smoker    Packs/day: 1.00    Years: 30.00    Pack years: 30.00  . Smokeless tobacco: Former Systems developer  . Tobacco comment: 01/10/2019  Vaping Use  . Vaping Use: Never  used  Substance and Sexual Activity  . Alcohol use: Yes    Comment: 8oz of wine a day  . Drug use: Never  . Sexual activity: Not on file  Other Topics Concern  . Not on file  Social History Narrative  . Not on file   Social Determinants of Health   Financial Resource Strain: Not on file  Food Insecurity: Not on file  Transportation Needs: Not on file  Physical Activity: Not on file  Stress: Not on file  Social Connections: Not on file  Intimate Partner Violence: Not on file    Past Surgical History:  Procedure Laterality Date  . ATRIAL FIBRILLATION ABLATION  10/2015  . BACK SURGERY    . CARDIOVERSION  2017  . CATARACT EXTRACTION Bilateral 2017   March and April 2017  . COLONOSCOPY  2018  . CORONARY STENT PLACEMENT  2012  . CORONARY/GRAFT ACUTE MI REVASCULARIZATION N/A 01/10/2019   Procedure: CORONARY/GRAFT ACUTE MI REVASCULARIZATION;  Surgeon: Leonie Man, MD;  Location: [facility];  Service: Cardiovascular;  Laterality: N/A;  . INGUINAL HERNIA REPAIR     over 20 years ago  . LEFT HEART CATH AND CORONARY ANGIOGRAPHY N/A 01/10/2019   Procedure: LEFT HEART CATH AND CORONARY ANGIOGRAPHY;  Surgeon: Leonie Man, MD;  Location: [facility];  Service: Cardiovascular;  Laterality: N/A;  . LUMBAR LAMINECTOMY Bilateral 04/05/2016   L2-L5   . VENTRAL HERNIA REPAIR  2018    Family History  Problem Relation Age of Onset  . Colon cancer Father   . Throat cancer Brother     Allergies  Allergen Reactions  . Naproxen Other (See Comments)    (Naprosyn *ANALGESICS - ANTI-INFLAMMATORY*) Nausea, Abdominal pain    Current Outpatient Medications on File Prior to Visit  Medication Sig Dispense Refill  . aspirin EC 81 MG tablet Take 81 mg by mouth daily. Swallow whole.    Marland Kitchen atorvastatin (LIPITOR) 80 MG tablet Take 1 tablet (80 mg total) by mouth daily at 6 PM. 30 tablet 11  . buPROPion (WELLBUTRIN XL) 300 MG 24 hr tablet Take 1 tablet (300 mg total) by mouth  daily. 90 tablet 0  . carvedilol (COREG) 3.125 MG tablet Take 1 tablet (3.125 mg total) by mouth 2 (two) times daily with a meal. 90 tablet 3  . Cholecalciferol 25 MCG (1000 UT) tablet Take 1,000 Units by mouth daily.     . colchicine 0.6 MG tablet Take 0.6 mg by mouth daily.    Marland Kitchen diltiazem (CARDIZEM) 30 MG tablet Take 30 mg by mouth daily. Unknown strengh    . enalapril (VASOTEC) 2.5 MG tablet Take 1 tablet (2.5 mg total) by mouth daily. 90 tablet 1  . enalapril (VASOTEC) 5 MG tablet Take 5 mg by mouth daily.    . famotidine (PEPCID) 40 MG tablet TAKE 1 TABLET(40  MG) BY MOUTH DAILY 90 tablet 3  . fexofenadine (ALLEGRA) 180 MG tablet Take 180 mg by mouth at bedtime.     . nitroGLYCERIN (NITROSTAT) 0.4 MG SL tablet Place 1 tablet (0.4 mg total) under the tongue every 5 (five) minutes as needed for chest pain. 25 tablet 3  . Omega-3 Fatty Acids (FISH OIL) 1000 MG CAPS Take 1,000 mg by mouth.    . pantoprazole (PROTONIX) 40 MG tablet TAKE 1 TABLET(40 MG) BY MOUTH DAILY 90 tablet 1  . sertraline (ZOLOFT) 100 MG tablet Take 150 mg by mouth at bedtime.     . solifenacin (VESICARE) 5 MG tablet Take 5 mg by mouth daily.    . tamsulosin (FLOMAX) 0.4 MG CAPS capsule TAKE 1 CAPSULE(0.4 MG) BY MOUTH DAILY 90 capsule 0  . traZODone (DESYREL) 50 MG tablet Take 0.5-1 tablets (25-50 mg total) by mouth at bedtime as needed for sleep. 30 tablet 3  . Turmeric 500 MG CAPS Take 1 capsule by mouth 2 (two) times daily.     . vitamin B-12 (CYANOCOBALAMIN) 1000 MCG tablet Take 1,000 mcg by mouth daily.    Alveda Reasons 20 MG TABS tablet TAKE 1 TABLET(20 MG) BY MOUTH DAILY WITH SUPPER 30 tablet 5   No current facility-administered medications on file prior to visit.    BP (!) 91/53   Pulse 93   Resp 18   Ht 5\' 11"  (1.803 m)   Wt 191 lb (86.6 kg)   SpO2 94%   BMI 26.64 kg/m       Objective:   Physical Exam   General Mental Status- Alert. General Appearance- Not in acute distress.   Skin General: Color-  Normal Color. Moisture- Normal Moisture.  Neck Carotid Arteries- Normal color. Moisture- Normal Moisture. No carotid bruits. No JVD.  Chest and Lung Exam Auscultation: Breath Sounds:- equal and symmetric but shallow.  Cardiovascular Auscultation:Rythm- Regular. Murmurs & Other Heart Sounds:Auscultation of the heart reveals- No Murmurs.  Abdomen Inspection:-Inspeection Normal. Palpation/Percussion:Note:No mass. Palpation and Percussion of the abdomen reveal- Non Tender, Non Distended + BS, no rebound or guarding.    Neurologic Cranial Nerve exam:- CN III-XII intact(No nystagmus), symmetric smile.  Strength:- 5/5 equal and symmetric strength both upper and lower extremities.  Lower extremity no pedal edema.     Assessment & Plan:  History of recent low blood pressures with history of hypertension.  Dr. Agustin Cree cardiologist decreased your Vasotec from 5 mg down to 2.5 mg.  Your blood pressure continues to be low.  I do recommend that you stop the Vasotec completely today.  We will see if your blood pressure increases to more reasonable level.  Will get CBC and CMP today to make sure no other condition affecting blood pressure.  Check your blood pressure daily over the next week and send me a MyChart update or call office with update.  Continue other cardiac meds the same.  History of COPD with increased dyspnea on exertion.  Glad to hear that you are seeing pulmonologist tomorrow.  Insomnia with some component of neuropathy affecting quality of sleep.  Continue trazodone 50 mg at night and advised using 300 mg of gabapentin.  Can also add melatonin 3 to 5 mg tablet at night to sleep regimen.  History of paroxysmal atrial fibrillation.  Most recent cardiology note reviewed.  Continue current medications advised by Dr. Agustin Cree except DC the Vasotec as explained above.  History of BPH.  Refilled your finasteride today.  Follow-up date to be  determined based on lab review as well  as your blood pressure update readings in 1 week.  Mackie Pai, [name]

## 2020-04-30 NOTE — Patient Instructions (Signed)
History of recent low blood pressures with history of hypertension.  Dr. Agustin Cree cardiologist decreased your Vasotec from 5 mg down to 2.5 mg.  Your blood pressure continues to be low.  I do recommend that you stop the Vasotec completely today.  We will see if your blood pressure increases to more reasonable level.  Will get CBC and CMP today to make sure no other condition affecting blood pressure.  Check your blood pressure daily over the next week and send me a MyChart update or call office with update.  Continue other cardiac meds the same.  History of COPD with increased dyspnea on exertion.  Glad to hear that you are seeing pulmonologist tomorrow.  Insomnia with some component of neuropathy affecting quality of sleep.  Continue trazodone 50 mg at night and advised using 300 mg of gabapentin.  Can also add melatonin 3 to 5 mg tablet at night to sleep regimen.  History of paroxysmal atrial fibrillation.  Most recent cardiology note reviewed.  Continue current medications advised by Dr. Agustin Cree except DC the Vasotec as explained above.  History of BPH.  Refilled your finasteride today.  Follow-up date to be determined based on lab review as well as your blood pressure update readings in 1 week.

## 2020-05-01 ENCOUNTER — Ambulatory Visit: Payer: Medicare Other | Admitting: Internal Medicine

## 2020-05-01 ENCOUNTER — Encounter: Payer: Self-pay | Admitting: Internal Medicine

## 2020-05-01 VITALS — BP 120/78 | HR 77

## 2020-05-01 DIAGNOSIS — Z87898 Personal history of other specified conditions: Secondary | ICD-10-CM

## 2020-05-01 DIAGNOSIS — Z87891 Personal history of nicotine dependence: Secondary | ICD-10-CM | POA: Diagnosis not present

## 2020-05-01 DIAGNOSIS — R0609 Other forms of dyspnea: Secondary | ICD-10-CM

## 2020-05-01 DIAGNOSIS — R0600 Dyspnea, unspecified: Secondary | ICD-10-CM | POA: Diagnosis not present

## 2020-05-01 NOTE — Patient Instructions (Signed)
ICD-10-CM   1. Dyspnea on exertion  R06.00   2. History of snoring  Z87.898   3. Stopped smoking with greater than 30 pack year history  Z87.891    Cause not known. Can be deconditioning, lung diseases like fibrosis and copd need rule out  Plan Get HRCT supine and prone Get full PFT Get ABG on room air  Refer sleep doc in our office  Followup  - refer sleep doc in our office  -return to see Dr Chase Caller or an APP in few to several weeks but after test complettion

## 2020-05-01 NOTE — Progress Notes (Signed)
OV 05/01/2020  Subjective:  Patient ID: Robert Lang, male , DOB: 1951/02/26 , age 70 y.o. , MRN: [id number] , ADDRESS: [place]  [id number] PCP Mackie Pai, [name] Patient Care Team: [name], Iris Pert as PCP - General (Internal Medicine) Park Liter, MD as PCP - Cardiology (Cardiology)  This Provider for this visit: Treatment Team:  Attending Provider: Brand Males, MD    05/01/2020 -   Chief Complaint  Patient presents with  . Consult    Pt is being referred by Dr. Joycelyn Rua due to emphysema and SOB.  Pt states he has had problems with SOB for for about 2 years which is worse with ambulation but can happen at any time. Pt also has an occ cough. Denies any chest tightness.     HPI Robert Lang 70 y.o. 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  History is provided by him review of the chart and the patient's wife.  In October 2021 he suffered a myocardial infarction and that they quit smoking.  Up until that point had a 30 pack smoking history.  He was living in [place] and then around the time also moved to [facility] area.  He says since his heart attack and recovery from that he has had insidious worsening of shortness of breath on exertion relieved by rest.  Definitely steady and progressive.  At this point in time changing clothes or bending over taking a shower makes him short of breath.  Walking around the block in his house makes him short of breath.  Relieved by rest no associated chest pain.  He is not had a CT scan of the chest.  Chest x-ray in the summer 2021 is reviewed is clear and personally visualized.  There is no associated cough or wheezing orthopnea proximal nocturnal dyspnea.   February 2022 creatinine is normal.  Hemoglobin is 15.3 g% and normal.  Eosinophils are normal.  He has a normal PSA in June 2020  Echocardiogram December 2021 is normal.  Was also normal in July 2020.Marland Kitchen  In August 2020 when he had  a cardiac stress test that is described as low risk with good ejection fraction.   Of note he snores.  He also fatigue during the day.  Few years ago he had sleep apnea test at [facility] and this was normal.  Wife says they were surprised by it.?  He has apneic spells at night.  Is also been using a cane because of balance issues and neuropathy for several years.  Walking desaturation test today in the office: We typically do three laps of 150-180 feet.  He could only do one lap on room and had to stop because of balance issues.  His resting pulse ox was 99% with a resting heart rate of 77/min.  At the end of one lap he was only mild shortness of breath but he stopped mainly because of balance issues.  His pulse ox was 97% and a heart rate of 92/min.  CT Chest data  No results found.    PFT  No flowsheet data found.     has a past medical history of Acute ST elevation myocardial infarction (STEMI) of inferolateral wall ([place]) (01/10/2019), 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), Allergic rhinitis due to pollen (09/03/2013), Allergy, Anticoagulated (07/01/2016), Anxiety, Arthritis, Atrial fibrillation ([place]), Atrial flutter ([place]) (06/26/2015), CAD (coronary artery disease), Chest pain (06/26/2015), COPD (chronic obstructive pulmonary disease) ([place]), Coronary artery disease (07/06/2018), DDD (degenerative disc  disease), cervical (09/03/2013), Depression, Depression, Depressive disorder (09/03/2013), Dizziness (10/28/2017), Dyslipidemia, goal LDL below 70 (07/06/2018), Dyspnea on exertion (10/20/2018), Esophageal reflux (09/03/2013), Essential hypertension (07/06/2018), ETOH abuse (01/11/2019), Falls (10/28/2017), GERD (gastroesophageal reflux disease), H/O amiodarone therapy (07/01/2016), Heart attack ([place]), Heart palpitations (01/27/2017), History of colon polyps, Hyperlipidemia, Hypertension, Kidney stones, Neck pain (09/03/2013), Neuropathy (07/06/2018), Obstructive sleep apnea (08/05/2015), PLMD (periodic  limb movement disorder) (11/04/2015), Polycythemia, 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   reports that he quit smoking about 2 years ago. His smoking use included cigarettes. He has a 30.00 pack-year smoking history. He has quit using smokeless tobacco.  Past Surgical History:  Procedure Laterality Date  . ATRIAL FIBRILLATION ABLATION  10/2015  . BACK SURGERY    . CARDIOVERSION  2017  . CATARACT EXTRACTION Bilateral 2017   March and April 2017  . COLONOSCOPY  2018  . CORONARY STENT PLACEMENT  2012  . CORONARY/GRAFT ACUTE MI REVASCULARIZATION N/A 01/10/2019   Procedure: CORONARY/GRAFT ACUTE MI REVASCULARIZATION;  Surgeon: Leonie Man, MD;  Location: [facility];  Service: Cardiovascular;  Laterality: N/A;  . INGUINAL HERNIA REPAIR     over 20 years ago  . LEFT HEART CATH AND CORONARY ANGIOGRAPHY N/A 01/10/2019   Procedure: LEFT HEART CATH AND CORONARY ANGIOGRAPHY;  Surgeon: Leonie Man, MD;  Location: [facility];  Service: Cardiovascular;  Laterality: N/A;  . LUMBAR LAMINECTOMY Bilateral 04/05/2016   L2-L5   . VENTRAL HERNIA REPAIR  2018    Allergies  Allergen Reactions  . Naproxen Other (See Comments)    (Naprosyn *ANALGESICS - ANTI-INFLAMMATORY*) Nausea, Abdominal pain    Immunization History  Administered Date(s) Administered  . Influenza Split 01/09/2010, 02/09/2011  . Influenza, High Dose Seasonal PF 11/15/2018, 01/11/2020  . Influenza,inj,Quad PF,6+ Mos 01/01/2015  . Influenza-Unspecified 01/30/2014, 02/02/2016  . PFIZER(Purple Top)SARS-COV-2 Vaccination 04/27/2019, 05/18/2019, 01/14/2020  . Pneumococcal Conjugate-13 11/23/2018  . Tdap 02/18/2011    Family History  Problem Relation Age of Onset  . Colon cancer Father   . Throat cancer Brother      Current Outpatient  Medications:  .  atorvastatin (LIPITOR) 80 MG tablet, Take 1 tablet (80 mg total) by mouth daily at 6 PM., Disp: 30 tablet, Rfl: 11 .  buPROPion (WELLBUTRIN XL) 300 MG 24 hr tablet, Take 1 tablet (300 mg total) by mouth daily., Disp: 90 tablet, Rfl: 0 .  carvedilol (COREG) 3.125 MG tablet, Take 1 tablet (3.125 mg total) by mouth 2 (two) times daily with a meal., Disp: 90 tablet, Rfl: 3 .  Cholecalciferol 25 MCG (1000 UT) tablet, Take 1,000 Units by mouth daily. , Disp: , Rfl:  .  famotidine (PEPCID) 40 MG tablet, TAKE 1 TABLET(40 MG) BY MOUTH DAILY, Disp: 90 tablet, Rfl: 3 .  fexofenadine (ALLEGRA) 180 MG tablet, Take 180 mg by mouth at bedtime. , Disp: , Rfl:  .  finasteride (PROSCAR) 5 MG tablet, Take 1 tablet (5 mg total) by mouth daily., Disp: 90 tablet, Rfl: 1 .  gabapentin (NEURONTIN) 100 MG capsule, 2-3 tab po q hs (Patient taking differently: Take 300 mg by mouth at bedtime. 3 tab po q hs), Disp: 90 capsule, Rfl: 0 .  Multiple Vitamins-Minerals (PRESERVISION AREDS PO), Take 2 capsules by mouth in the morning and at bedtime., Disp: , Rfl:  .  nitroGLYCERIN (NITROSTAT) 0.4 MG SL tablet, Place 1 tablet (0.4 mg total) under  the tongue every 5 (five) minutes as needed for chest pain., Disp: 25 tablet, Rfl: 3 .  Omega-3 Fatty Acids (FISH OIL) 1000 MG CAPS, Take 1,000 mg by mouth., Disp: , Rfl:  .  ranolazine (RANEXA) 1000 MG SR tablet, Take 1 tablet (1,000 mg total) by mouth 2 (two) times daily., Disp: 180 tablet, Rfl: 1 .  sertraline (ZOLOFT) 100 MG tablet, Take 150 mg by mouth at bedtime. , Disp: , Rfl:  .  solifenacin (VESICARE) 5 MG tablet, Take 5 mg by mouth daily., Disp: , Rfl:  .  tamsulosin (FLOMAX) 0.4 MG CAPS capsule, TAKE 1 CAPSULE(0.4 MG) BY MOUTH DAILY, Disp: 90 capsule, Rfl: 0 .  traZODone (DESYREL) 50 MG tablet, Take 0.5-1 tablets (25-50 mg total) by mouth at bedtime as needed for sleep., Disp: 30 tablet, Rfl: 3 .  Turmeric 500 MG CAPS, Take 1 capsule by mouth 2 (two) times daily. ,  Disp: , Rfl:  .  vitamin B-12 (CYANOCOBALAMIN) 1000 MCG tablet, Take 1,000 mcg by mouth daily., Disp: , Rfl:  .  XARELTO 20 MG TABS tablet, TAKE 1 TABLET(20 MG) BY MOUTH DAILY WITH SUPPER, Disp: 30 tablet, Rfl: 5      Objective:   Vitals:   05/01/20 1118  BP: 120/78  Pulse: 77  SpO2: 95%    Estimated body mass index is 26.64 kg/m as calculated from the following:   Height as of 04/30/20: 5\' 11"  (1.803 m).   Weight as of 04/30/20: 191 lb (86.6 kg).  @WEIGHTCHANGE @  There were no vitals filed for this visit.   Physical Exam  General Appearance:    Alert, cooperative, no distress, appears stated age - look stabl , Deconditioned looking - yes , OBESE  - n, Sitting on Wheelchair -  no  Head:    Normocephalic, without obvious abnormality, atraumatic  Eyes:    PERRL, conjunctiva/corneas clear,  Ears:    Normal TM's and external ear canals, both ears  Nose:   Nares normal, septum midline, mucosa normal, no drainage    or sinus tenderness. OXYGEN ON  - no . Patient is @ ra   Throat:   Lips, mucosa, and tongue normal; teeth and gums normal. Cyanosis on lips - no but has ?  Micrognathia  Neck:   Supple, symmetrical, trachea midline, no adenopathy;    thyroid:  no enlargement/tenderness/nodules; no carotid   bruit or JVD  Back:     Symmetric, no curvature, ROM normal, no CVA tenderness  Lungs:     Distress - no , Wheeze no, Barrell Chest - no, Purse lip breathing - no, Crackles - no   Chest Wall:    No tenderness or deformity.    Heart:    Regular rate and rhythm, S1 and S2 normal, no rub   or gallop, Murmur - no  Breast Exam:    NOT DONE  Abdomen:     Soft, non-tender, bowel sounds active all four quadrants,    no masses, no organomegaly. Visceral obesity - yes  Genitalia:   NOT DONE  Rectal:   NOT DONE  Extremities:   Extremities - normal, Has Cane - yes, Clubbing - no, Edema - no  Pulses:   2+ and symmetric all extremities  Skin:   Stigmata of Connective Tissue Disease - no  Lymph  nodes:   Cervical, supraclavicular, and axillary nodes normal  Psychiatric:  Neurologic:   Pleasant - yes, Anxious - no, Flat affect - no  CAm-ICU - neg,  Alert and Oriented x 3 - yes, Moves all 4s - yes, Speech - normal, Cognition - intact        Assessment:       ICD-10-CM   1. Dyspnea on exertion  R06.00   2. History of snoring  Z87.898   3. Stopped smoking with greater than 30 pack year history  Z87.891        Plan:     Patient Instructions     ICD-10-CM   1. Dyspnea on exertion  R06.00   2. History of snoring  Z87.898   3. Stopped smoking with greater than 30 pack year history  Z87.891    Cause not known. Can be deconditioning, lung diseases like fibrosis and copd need rule out  Plan Get HRCT supine and prone Get full PFT Get ABG on room air  Refer sleep doc in our office  Followup  - refer sleep doc in our office  -return to see Dr Chase Caller or an APP in few to several weeks but after test complettion     SIGNATURE    Dr. Brand Males, M.D., F.C.C.P,  Pulmonary and Critical Care Medicine Staff Physician, [place] Director - Interstitial Lung Disease  Program  Pulmonary [facility] [place], [place], [address]  Pager: [phone], If no answer or between  15:00h - 7:00h: call [phone] Telephone: [phone]  12:17 PM 05/01/2020

## 2020-05-02 ENCOUNTER — Other Ambulatory Visit: Payer: Self-pay | Admitting: Cardiology

## 2020-05-05 NOTE — Telephone Encounter (Signed)
Rx refill sent to pharmacy. 

## 2020-05-07 ENCOUNTER — Telehealth: Payer: Self-pay | Admitting: Internal Medicine

## 2020-05-07 NOTE — Telephone Encounter (Signed)
Plan Get HRCT supine and prone Get full PFT Get ABG on room air  Refer sleep doc in our office  Followup  - refer sleep doc in our office  -return to see Dr Chase Caller or an APP in few to several weeks but after test completion  At pt's last OV, appt was supposed to be made with one of our sleep doctors for a consultation. This appt was not made. Called and spoke with pt's wife [name] letting her know that we needed to get pt scheduled for an appt with one of our sleep docs for consultation and she verbalized understanding. appt has been scheduled. Nothing further needed.

## 2020-05-08 ENCOUNTER — Ambulatory Visit (INDEPENDENT_AMBULATORY_CARE_PROVIDER_SITE_OTHER)
Admission: RE | Admit: 2020-05-08 | Discharge: 2020-05-08 | Disposition: A | Discharge status: Home / Self Care | Payer: Medicare Other | Source: Ambulatory Visit | Attending: Internal Medicine | Admitting: Internal Medicine

## 2020-05-08 ENCOUNTER — Other Ambulatory Visit: Payer: Self-pay

## 2020-05-08 DIAGNOSIS — R0609 Other forms of dyspnea: Secondary | ICD-10-CM

## 2020-05-08 DIAGNOSIS — J479 Bronchiectasis, uncomplicated: Secondary | ICD-10-CM | POA: Diagnosis not present

## 2020-05-08 DIAGNOSIS — J84112 Idiopathic pulmonary fibrosis: Secondary | ICD-10-CM | POA: Diagnosis not present

## 2020-05-08 DIAGNOSIS — I2510 Atherosclerotic heart disease of native coronary artery without angina pectoris: Secondary | ICD-10-CM | POA: Diagnosis not present

## 2020-05-08 DIAGNOSIS — R0600 Dyspnea, unspecified: Secondary | ICD-10-CM

## 2020-05-09 ENCOUNTER — Ambulatory Visit (HOSPITAL_COMMUNITY)
Admission: RE | Admit: 2020-05-09 | Discharge: 2020-05-09 | Disposition: A | Discharge status: Home / Self Care | Payer: Medicare Other | Source: Ambulatory Visit | Attending: Internal Medicine | Admitting: Internal Medicine

## 2020-05-09 DIAGNOSIS — R0609 Other forms of dyspnea: Secondary | ICD-10-CM

## 2020-05-09 DIAGNOSIS — R0600 Dyspnea, unspecified: Secondary | ICD-10-CM | POA: Diagnosis not present

## 2020-05-09 LAB — BLOOD GAS, ARTERIAL
Acid-base deficit: 2.6 mmol/L — ABNORMAL HIGH (ref 0.0–2.0)
Bicarbonate: 21.3 mmol/L (ref 20.0–28.0)
Drawn by: 21179
FIO2: 21
O2 Saturation: 95.7 %
Patient temperature: 37
pCO2 arterial: 34.5 mmHg (ref 32.0–48.0)
pH, Arterial: 7.407 (ref 7.350–7.450)
pO2, Arterial: 81 mmHg — ABNORMAL LOW (ref 83.0–108.0)

## 2020-05-11 ENCOUNTER — Encounter: Payer: Self-pay | Admitting: Medical

## 2020-05-12 NOTE — Progress Notes (Signed)
05/13/20- 59 yoM former smoker (30 pkyrs) for sleep evaluation with concern of OSA Medical problem list includes CAD/ MI, AFIB, HTN, Allergic Rhinitis, COPD, ILD, Lung Nodules (Dr Chase Caller), Degenertive Disc Disease, ETOH, Hyperlipidemia,  Epworth score- 5 Body weight today-194 lbs Covid vax-3 Phizer Flu vax-had -----Patient states that he snores and wakes up often, states he has been tested before and remote sleep study said he was right on the line for it. Wakes up about 4-5 times a night. Some nights is very restless. Was told at his prior sleep study that his legs were very active. He has punched head board of bed, but not wife. Denies sleep walking. Denies family hx of OSA, dementia or Parkinson's.  ENT surgery+ tonsils.  Bedtime 8PM, waso 4x before up 7AM.  Gabapentin.   Prior to Admission medications   Medication Sig Start Date End Date Taking? Authorizing Provider  atorvastatin (LIPITOR) 80 MG tablet Take 1 tablet (80 mg total) by mouth daily at 6 PM. 10/26/19  Yes Park Liter, MD  buPROPion (WELLBUTRIN XL) 300 MG 24 hr tablet Take 1 tablet (300 mg total) by mouth daily. 07/09/19  Yes [name], Percell Miller, [name]  carvedilol (COREG) 3.125 MG tablet TAKE 1 TABLET(3.125 MG) BY MOUTH TWICE DAILY WITH A MEAL 05/05/20  Yes Park Liter, MD  Cholecalciferol 25 MCG (1000 UT) tablet Take 1,000 Units by mouth daily.    Yes [provider]  famotidine (PEPCID) 40 MG tablet TAKE 1 TABLET(40 MG) BY MOUTH DAILY 02/27/20  Yes Park Liter, MD  fexofenadine (ALLEGRA) 180 MG tablet Take 180 mg by mouth at bedtime.  04/09/08  Yes [provider]  finasteride (PROSCAR) 5 MG tablet Take 1 tablet (5 mg total) by mouth daily. 04/30/20  Yes [name], Percell Miller, [name]  gabapentin (NEURONTIN) 100 MG capsule 2-3 tab po q hs Patient taking differently: Take 300 mg by mouth at bedtime. 3 tab po q hs 04/30/20  Yes [name], Percell Miller, [name]  Multiple Vitamins-Minerals (PRESERVISION AREDS PO) Take 2  capsules by mouth in the morning and at bedtime.   Yes [provider]  nitroGLYCERIN (NITROSTAT) 0.4 MG SL tablet Place 1 tablet (0.4 mg total) under the tongue every 5 (five) minutes as needed for chest pain. 10/11/18  Yes Park Liter, MD  Omega-3 Fatty Acids (FISH OIL) 1000 MG CAPS Take 1,000 mg by mouth.   Yes [provider]  ranolazine (RANEXA) 1000 MG SR tablet Take 1 tablet (1,000 mg total) by mouth 2 (two) times daily. 04/30/20  Yes [name], Percell Miller, [name]  sertraline (ZOLOFT) 100 MG tablet Take 150 mg by mouth at bedtime.  06/13/18  Yes [provider]  solifenacin (VESICARE) 5 MG tablet Take 5 mg by mouth daily.   Yes [provider]  tamsulosin (FLOMAX) 0.4 MG CAPS capsule TAKE 1 CAPSULE(0.4 MG) BY MOUTH DAILY 06/07/19  Yes [name], Percell Miller, [name]  traZODone (DESYREL) 50 MG tablet Take 0.5-1 tablets (25-50 mg total) by mouth at bedtime as needed for sleep. 01/29/20  Yes [name], Percell Miller, [name]  Turmeric 500 MG CAPS Take 1 capsule by mouth 2 (two) times daily.    Yes [provider]  vitamin B-12 (CYANOCOBALAMIN) 1000 MCG tablet Take 1,000 mcg by mouth daily.   Yes [provider]  XARELTO 20 MG TABS tablet TAKE 1 TABLET(20 MG) BY MOUTH DAILY WITH SUPPER 04/23/20  Yes Park Liter, MD   Past Medical History:  Diagnosis Date  . Acute ST elevation myocardial  infarction (STEMI) of inferolateral wall ([place]) 01/10/2019  . 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 M75.00)  . Allergic rhinitis due to pollen 09/03/2013   J30.1)  Formatting of this note might be different from the original. J30.1)  . Allergy   . Anticoagulated 07/01/2016  . Anxiety   . Arthritis   . Atrial fibrillation ([place])   . Atrial flutter ([place]) 06/26/2015  . CAD (coronary artery disease)   . Chest pain 06/26/2015  . COPD (chronic obstructive pulmonary disease) ([place])   . Coronary artery disease  07/06/2018   Cardiac catheterization 2017 showing 90% small diagonal branch disease  . DDD (degenerative disc disease), cervical 09/03/2013   M50.90)  Formatting of this note might be different from the original. M50.90)  . Depression   . Depression   . Depressive disorder 09/03/2013  . Dizziness 10/28/2017  . Dyslipidemia, goal LDL below 70 07/06/2018  . Dyspnea on exertion 10/20/2018  . Esophageal reflux 09/03/2013  . Essential hypertension 07/06/2018  . ETOH abuse 01/11/2019   6 pack of beer per day  . Falls 10/28/2017  . GERD (gastroesophageal reflux disease)   . H/O amiodarone therapy 07/01/2016  . Heart attack ([place])   . Heart palpitations 01/27/2017  . History of colon polyps   . Hyperlipidemia   . Hypertension   . Kidney stones   . Neck pain 09/03/2013  . Neuropathy 07/06/2018  . Obstructive sleep apnea 08/05/2015  . PLMD (periodic limb movement disorder) 11/04/2015  . 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 STORY: Due to alcohol/ tobacco  . Pure hypercholesterolemia 09/03/2013   E78.0)  Formatting of this note might be different from the original. E78.0)  . Screening for prostate cancer 09/03/2013  . Smoker 01/11/2019  . Smoking 07/06/2018  . Status post ablation of atrial flutter 07/06/2018   2017   Past Surgical History:  Procedure Laterality Date  . ATRIAL FIBRILLATION ABLATION  10/2015  . BACK SURGERY    . CARDIOVERSION  2017  . CATARACT EXTRACTION Bilateral 2017   March and April 2017  . COLONOSCOPY  2018  . CORONARY STENT PLACEMENT  2012  . CORONARY/GRAFT ACUTE MI REVASCULARIZATION N/A 01/10/2019   Procedure: CORONARY/GRAFT ACUTE MI REVASCULARIZATION;  Surgeon: Leonie Man, MD;  Location: [facility];  Service: Cardiovascular;  Laterality: N/A;  . INGUINAL HERNIA REPAIR     over 20 years ago  . LEFT HEART CATH AND CORONARY ANGIOGRAPHY N/A 01/10/2019   Procedure: LEFT HEART  CATH AND CORONARY ANGIOGRAPHY;  Surgeon: Leonie Man, MD;  Location: [facility];  Service: Cardiovascular;  Laterality: N/A;  . LUMBAR LAMINECTOMY Bilateral 04/05/2016   L2-L5   . VENTRAL HERNIA REPAIR  2018   Family History  Problem Relation Age of Onset  . Colon cancer Father   . Throat cancer Brother    Social History   Socioeconomic History  . Marital status: Married    Spouse name: Not on file  . Number of children: Not on file  . Years of education: Not on file  . Highest education level: Not on file  Occupational History  . Not on file  Tobacco Use  . Smoking status: Former Smoker    Packs/day: 1.00    Years: 30.00    Pack years: 30.00    Types: Cigarettes  Quit date: 01/09/2018    Years since quitting: 2.3  . Smokeless tobacco: Former Network engineer  . Vaping Use: Never used  Substance and Sexual Activity  . Alcohol use: Yes    Comment: 8oz of wine a day  . Drug use: Never  . Sexual activity: Not on file  Other Topics Concern  . Not on file  Social History Narrative  . Not on file   Social Determinants of Health   Financial Resource Strain: Not on file  Food Insecurity: Not on file  Transportation Needs: Not on file  Physical Activity: Not on file  Stress: Not on file  Social Connections: Not on file  Intimate Partner Violence: Not on file   ROS-see HPI   + = positive Constitutional:    weight loss, night sweats, fevers, chills, fatigue, lassitude. HEENT:    headaches, difficulty swallowing, tooth/dental problems, sore throat,       sneezing, itching, ear ache, nasal congestion, post nasal drip, snoring CV:    chest pain, orthopnea, PND, swelling in lower extremities, anasarca,                                  dizziness, palpitations Resp:   shortness of breath with exertion or at rest.                productive cough,   non-productive cough, coughing up of blood.              change in color of mucus.  wheezing.   Skin:    rash or  lesions. GI:  No-   heartburn, indigestion, abdominal pain, nausea, vomiting, diarrhea,                 change in bowel habits, loss of appetite GU: dysuria, change in color of urine, no urgency or frequency.   flank pain. MS:   joint pain, stiffness, decreased range of motion, back pain. Neuro-     + peripheral neuropathy Psych:  change in mood or affect.  depression or anxiety.   memory loss.  OBJ- Physical Exam    +overweight General- Alert, Oriented, Affect-appropriate, Distress- none acute Skin- rash-none, lesions- none, excoriation- none Lymphadenopathy- none Head- atraumatic            Eyes- Gross vision intact, PERRLA, conjunctivae and secretions clear            Ears- Hearing, canals-normal            Nose- Clear, no-Septal dev, mucus, polyps, erosion, perforation             Throat- Mallampati III , mucosa clear , drainage- none, tonsils- absent,  +teeth Neck- flexible , trachea midline, no stridor , thyroid nl, carotid no bruit Chest - symmetrical excursion , unlabored           Heart/CV- RRR , no murmur , no gallop  , no rub, nl s1 s2                           - JVD- none , edema- none, stasis changes- none, varices- none           Lung- clear to P&A, wheeze- none, cough- none , dullness-none, rub- none           Chest wall-  Abd-  Br/ Gen/ Rectal- Not done, not indicated Extrem-  cyanosis- none, clubbing, none, atrophy- none, strength- nl Neuro- +tremor R hand

## 2020-05-13 ENCOUNTER — Encounter: Payer: Self-pay | Admitting: Internal Medicine

## 2020-05-13 ENCOUNTER — Ambulatory Visit (INDEPENDENT_AMBULATORY_CARE_PROVIDER_SITE_OTHER): Payer: Medicare Other | Admitting: Internal Medicine

## 2020-05-13 ENCOUNTER — Other Ambulatory Visit: Payer: Self-pay

## 2020-05-13 DIAGNOSIS — R0683 Snoring: Secondary | ICD-10-CM | POA: Diagnosis not present

## 2020-05-13 DIAGNOSIS — G4761 Periodic limb movement disorder: Secondary | ICD-10-CM

## 2020-05-13 NOTE — Assessment & Plan Note (Signed)
Using gabapentin, which seems to have helped some compared with baseline. Note some tremor today. Keep potential need for Parkinson's evaluation in mind.

## 2020-05-13 NOTE — Patient Instructions (Signed)
Order- schedule home sleep test   Dx snoring  Please call us about 2 weeks after your sleep study for results and recommendations. We may be able to start treatment before we see you next.

## 2020-05-13 NOTE — Assessment & Plan Note (Signed)
Suspicious for OSA. He may also have limb movement sleep disorder and/ or RBD. We will check HST first, easier for him. Correction of apnea related arousals may resolve some of the other sleep issues. He may still eventually need in-center sleep study. We reviewed OSA, safe driving responsibility, testing and treatment options.  Plan- sleep study, then likely CPAP if appropriate.

## 2020-05-15 ENCOUNTER — Other Ambulatory Visit: Payer: Self-pay

## 2020-05-15 ENCOUNTER — Ambulatory Visit (INDEPENDENT_AMBULATORY_CARE_PROVIDER_SITE_OTHER): Payer: Medicare Other | Admitting: Medical

## 2020-05-15 VITALS — BP 120/60 | HR 89 | Temp 98.2°F | Resp 20 | Ht 71.0 in | Wt 194.4 lb

## 2020-05-15 DIAGNOSIS — R0683 Snoring: Secondary | ICD-10-CM | POA: Diagnosis not present

## 2020-05-15 DIAGNOSIS — I10 Essential (primary) hypertension: Secondary | ICD-10-CM

## 2020-05-15 DIAGNOSIS — E782 Mixed hyperlipidemia: Secondary | ICD-10-CM

## 2020-05-15 NOTE — Progress Notes (Signed)
Subjective:    Patient ID: Robert Lang, male    DOB: 03-Jul-1950, 70 y.o.   MRN: [id number]  HPI  Pt in for follow up.  He had various bp reading at home. Some high, mid range and very low. I had asked him to bring his bp cuff into office to compare.  Pt bp 119/72 with his machine.  No cardiac or neurologic signs or symptoms.  Does not some slight light headed sensation on standing up for few seconds.  Pt has occasional gerd. Uses pepcid sparingly.    Review of Systems  Constitutional: Negative for chills, fatigue and fever.  Respiratory: Negative for cough, chest tightness, shortness of breath and wheezing.        Snored a lot last night per wife.  Cardiovascular: Negative for chest pain and palpitations.  Gastrointestinal: Negative for abdominal pain, constipation, nausea and vomiting.  Musculoskeletal: Negative for back pain and neck pain.  Skin: Negative for rash.  Neurological: Negative for dizziness, speech difficulty, weakness and light-headedness.  Hematological: Negative for adenopathy. Does not bruise/bleed easily.  Psychiatric/Behavioral: Negative for agitation, behavioral problems, decreased concentration and sleep disturbance. The patient is not nervous/anxious.     Past Medical History:  Diagnosis Date  . Acute ST elevation myocardial infarction (STEMI) of inferolateral wall ([place]) 01/10/2019  . 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 M75.00)  . Allergic rhinitis due to pollen 09/03/2013   J30.1)  Formatting of this note might be different from the original. J30.1)  . Allergy   . Anticoagulated 07/01/2016  . Anxiety   . Arthritis   . Atrial fibrillation ([place])   . Atrial flutter ([place]) 06/26/2015  . CAD (coronary artery disease)   . Chest pain 06/26/2015  . COPD (chronic obstructive pulmonary disease) ([place])   . Coronary artery disease 07/06/2018   Cardiac catheterization 2017 showing  90% small diagonal branch disease  . DDD (degenerative disc disease), cervical 09/03/2013   M50.90)  Formatting of this note might be different from the original. M50.90)  . Depression   . Depression   . Depressive disorder 09/03/2013  . Dizziness 10/28/2017  . Dyslipidemia, goal LDL below 70 07/06/2018  . Dyspnea on exertion 10/20/2018  . Esophageal reflux 09/03/2013  . Essential hypertension 07/06/2018  . ETOH abuse 01/11/2019   6 pack of beer per day  . Falls 10/28/2017  . GERD (gastroesophageal reflux disease)   . H/O amiodarone therapy 07/01/2016  . Heart attack ([place])   . Heart palpitations 01/27/2017  . History of colon polyps   . Hyperlipidemia   . Hypertension   . Kidney stones   . Neck pain 09/03/2013  . Neuropathy 07/06/2018  . Obstructive sleep apnea 08/05/2015  . PLMD (periodic limb movement disorder) 11/04/2015  . 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 STORY: Due to alcohol/ tobacco  . Pure hypercholesterolemia 09/03/2013   E78.0)  Formatting of this note might be different from the original. E78.0)  . Screening for prostate cancer 09/03/2013  . Smoker 01/11/2019  . Smoking 07/06/2018  . Status post ablation of atrial flutter 07/06/2018   2017     Social History   Socioeconomic History  . Marital status: Married    Spouse name: Not on file  . Number of children: Not on file  . Years of education: Not on file  .  Highest education level: Not on file  Occupational History  . Not on file  Tobacco Use  . Smoking status: Former Smoker    Packs/day: 1.00    Years: 30.00    Pack years: 30.00    Types: Cigarettes    Quit date: 01/09/2018    Years since quitting: 2.3  . Smokeless tobacco: Former Network engineer  . Vaping Use: Never used  Substance and Sexual Activity  . Alcohol use: Yes    Comment: 8oz of wine a day  . Drug use: Never  . Sexual activity: Not on file  Other Topics  Concern  . Not on file  Social History Narrative  . Not on file   Social Determinants of Health   Financial Resource Strain: Not on file  Food Insecurity: Not on file  Transportation Needs: Not on file  Physical Activity: Not on file  Stress: Not on file  Social Connections: Not on file  Intimate Partner Violence: Not on file    Past Surgical History:  Procedure Laterality Date  . ATRIAL FIBRILLATION ABLATION  10/2015  . BACK SURGERY    . CARDIOVERSION  2017  . CATARACT EXTRACTION Bilateral 2017   March and April 2017  . COLONOSCOPY  2018  . CORONARY STENT PLACEMENT  2012  . CORONARY/GRAFT ACUTE MI REVASCULARIZATION N/A 01/10/2019   Procedure: CORONARY/GRAFT ACUTE MI REVASCULARIZATION;  Surgeon: Leonie Man, MD;  Location: [facility];  Service: Cardiovascular;  Laterality: N/A;  . INGUINAL HERNIA REPAIR     over 20 years ago  . LEFT HEART CATH AND CORONARY ANGIOGRAPHY N/A 01/10/2019   Procedure: LEFT HEART CATH AND CORONARY ANGIOGRAPHY;  Surgeon: Leonie Man, MD;  Location: [facility];  Service: Cardiovascular;  Laterality: N/A;  . LUMBAR LAMINECTOMY Bilateral 04/05/2016   L2-L5   . VENTRAL HERNIA REPAIR  2018    Family History  Problem Relation Age of Onset  . Colon cancer Father   . Throat cancer Brother     Allergies  Allergen Reactions  . Naproxen Other (See Comments)    (Naprosyn *ANALGESICS - ANTI-INFLAMMATORY*) Nausea, Abdominal pain    Current Outpatient Medications on File Prior to Visit  Medication Sig Dispense Refill  . atorvastatin (LIPITOR) 80 MG tablet Take 1 tablet (80 mg total) by mouth daily at 6 PM. 30 tablet 11  . buPROPion (WELLBUTRIN XL) 300 MG 24 hr tablet Take 1 tablet (300 mg total) by mouth daily. 90 tablet 0  . carvedilol (COREG) 3.125 MG tablet TAKE 1 TABLET(3.125 MG) BY MOUTH TWICE DAILY WITH A MEAL 120 tablet 3  . Cholecalciferol 25 MCG (1000 UT) tablet Take 1,000 Units by mouth daily.     . famotidine  (PEPCID) 40 MG tablet TAKE 1 TABLET(40 MG) BY MOUTH DAILY 90 tablet 3  . fexofenadine (ALLEGRA) 180 MG tablet Take 180 mg by mouth at bedtime.     . finasteride (PROSCAR) 5 MG tablet Take 1 tablet (5 mg total) by mouth daily. 90 tablet 1  . gabapentin (NEURONTIN) 100 MG capsule 2-3 tab po q hs (Patient taking differently: Take 300 mg by mouth at bedtime. 3 tab po q hs) 90 capsule 0  . Multiple Vitamins-Minerals (PRESERVISION AREDS PO) Take 2 capsules by mouth in the morning and at bedtime.    . nitroGLYCERIN (NITROSTAT) 0.4 MG SL tablet Place 1 tablet (0.4 mg total) under the tongue every 5 (five) minutes as needed for chest pain. 25 tablet  3  . Omega-3 Fatty Acids (FISH OIL) 1000 MG CAPS Take 1,000 mg by mouth.    . ranolazine (RANEXA) 1000 MG SR tablet Take 1 tablet (1,000 mg total) by mouth 2 (two) times daily. 180 tablet 1  . sertraline (ZOLOFT) 100 MG tablet Take 150 mg by mouth at bedtime.     . solifenacin (VESICARE) 5 MG tablet Take 5 mg by mouth daily.    . tamsulosin (FLOMAX) 0.4 MG CAPS capsule TAKE 1 CAPSULE(0.4 MG) BY MOUTH DAILY 90 capsule 0  . traZODone (DESYREL) 50 MG tablet Take 0.5-1 tablets (25-50 mg total) by mouth at bedtime as needed for sleep. 30 tablet 3  . Turmeric 500 MG CAPS Take 1 capsule by mouth 2 (two) times daily.     . vitamin B-12 (CYANOCOBALAMIN) 1000 MCG tablet Take 1,000 mcg by mouth daily.    Alveda Reasons 20 MG TABS tablet TAKE 1 TABLET(20 MG) BY MOUTH DAILY WITH SUPPER 30 tablet 5   No current facility-administered medications on file prior to visit.    BP 120/60   Pulse 89   Temp 98.2 F (36.8 C) (Oral)   Resp 20   Ht 5\' 11"  (1.803 m)   Wt 194 lb 6.4 oz (88.2 kg)   SpO2 94%   BMI 27.11 kg/m      Objective:   Physical Exam  General Mental Status- Alert. General Appearance- Not in acute distress.   Skin General: Color- Normal Color. Moisture- Normal Moisture.  Neck Carotid Arteries- Normal color. Moisture- Normal Moisture. No carotid bruits.  No JVD.  Chest and Lung Exam Auscultation: Breath Sounds:-Normal.  Cardiovascular Auscultation:Rythm- Regular. Murmurs & Other Heart Sounds:Auscultation of the heart reveals- No Murmurs.  Abdomen Inspection:-Inspeection Normal. Palpation/Percussion:Note:No mass. Palpation and Percussion of the abdomen reveal- Non Tender, Non Distended + BS, no rebound or guarding.    Neurologic Cranial Nerve exam:- CN III-XII intact(No nystagmus), symmetric smile. Strength:- 5/5 equal and symmetric strength both upper and lower extremities.      Assessment & Plan:  History of htn with bp level very good today and your machine is very similar to my manual reading. Continue coreg 3.25 mg twice daily. Keep checking your blood pressure daily. If you get higher side reading then do extra consecutive readings 5 minutes apart. Get your balance when standing before ambulating. Update me daily bp readings in one week by my chart.  For high cholesterol continue atorvastatin 80 mg daily.  Hx of snoring. Pt states he is going to have at home sleep study. Advised follow thru with that.  Hx of snoring. Pt states he is going to have at home sleep study. Advised follow thru with that. Epxlained untreated sleep apnea can effect blood pressure.  For occasional gerd use pepcid if needed.  Follow up in 3 months or as needed. Maybe sooner if bp not well controlled.

## 2020-05-15 NOTE — Patient Instructions (Addendum)
History of htn with bp level very good today and your machine is very similar to my manual reading. Continue coreg 3.25 mg twice daily. Keep checking your blood pressure daily. If you get higher side reading then do extra consecutive readings 5 minutes apart. Get your balance when standing before ambulating. Update me daily bp readings in one week by my chart.  For high cholesterol continue atorvastatin 80 mg daily.  Hx of snoring. Pt states he is going to have at home sleep study. Advised follow thru with that. Epxlained untreated sleep apnea can effect blood pressure.  For occasional gerd use pepcid if needed.  Follow up in 3 months or as needed. Maybe sooner if bp not well controlled.

## 2020-05-18 ENCOUNTER — Telehealth: Payer: Self-pay | Admitting: Internal Medicine

## 2020-05-18 DIAGNOSIS — J849 Interstitial pulmonary disease, unspecified: Secondary | ICD-10-CM

## 2020-05-18 NOTE — Telephone Encounter (Signed)
Robert Lang  Please let Loghan Kurtzman know thanks for saying Hi when he saw Dr Annamaria Boots the other day. The HRCT shows ILD and some nodules  Plan - he needs to do ILD questionnaire morning prior to seeing me - can come early or drop in and take it now with him but bring it to visit - complete the following blood work atleast 1 week before visit  Serum: ESR,  ANA, DS-DNA, RF, anti-CCP,   MPO, PR-3, Total CK,  Aldolase, scl-70, ssA, ssB, anti-RNP, anti-JO-1,  & Hypersensitivity Pneumonitis Panel and aldolase and QUantiferon Gold    IMPRESSION: 1. 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 Findings are categorized as probable UIP per consensus guidelines: Diagnosis of Idiopathic Pulmonary Fibrosis: An Official ATS/ERS/JRS/ALAT Clinical Practice Guideline. [place], Iss 5, [id number], Nov 20 2016. 2. Three scattered solid basilar left lower lobe pulmonary nodules, largest 7 mm posteromedially. Non-contrast chest CT at 3-6 months is recommended. If the nodules are stable at time of repeat CT, then future CT at 18-24 months (from today's scan) is considered optional for low-risk patients, but is recommended for high-risk patients. This recommendation follows the consensus statement: Guidelines for Management of Incidental Pulmonary Nodules Detected on CT Images: From the Fleischner Society 2017; Radiology 2017; 284:228-243. 3. Three-vessel coronary atherosclerosis. 4. Aortic Atherosclerosis (ICD10-I70.0).   Electronically Signed   By: Ilona Sorrel M.D.   On: 05/08/2020 16:32

## 2020-05-20 ENCOUNTER — Encounter: Payer: Self-pay | Admitting: *Deleted

## 2020-05-20 NOTE — Telephone Encounter (Signed)
Called and spoke with pt letting him know the info stated by MR about recent test results. Stated to pt that I will mail a packet for him to fill out and bring back with him when he comes for appt 3/29. Pt verbalized understanding. Also stated to pt that he will need to get labs done about 1 week prior to appt. Pt requested to have a letter with info on it sent to his address along with the questionnaire packet. Letter has been written and all lab orders have been placed. Nothing further needed.

## 2020-05-27 ENCOUNTER — Other Ambulatory Visit: Payer: Self-pay | Admitting: Medical

## 2020-06-09 ENCOUNTER — Telehealth: Payer: Self-pay | Admitting: Internal Medicine

## 2020-06-09 DIAGNOSIS — R0683 Snoring: Secondary | ICD-10-CM

## 2020-06-09 NOTE — Telephone Encounter (Signed)
Spoke with the pt and apologized to her him that the HST order was not placed  I have placed the order and notified that [facility] will call him soon to schedule  Nothing further needed

## 2020-06-10 ENCOUNTER — Telehealth: Payer: Self-pay | Admitting: Internal Medicine

## 2020-06-10 ENCOUNTER — Ambulatory Visit: Payer: Medicare Other

## 2020-06-10 ENCOUNTER — Other Ambulatory Visit: Payer: Self-pay

## 2020-06-10 DIAGNOSIS — R0683 Snoring: Secondary | ICD-10-CM

## 2020-06-10 DIAGNOSIS — G4733 Obstructive sleep apnea (adult) (pediatric): Secondary | ICD-10-CM | POA: Diagnosis not present

## 2020-06-10 DIAGNOSIS — J849 Interstitial pulmonary disease, unspecified: Secondary | ICD-10-CM

## 2020-06-10 NOTE — Telephone Encounter (Signed)
Patient and wife Robert Lang were in office. I spoke with them about current schedule with PFT and OV with Dr. Chase Caller scheduled 06/17/20. Patient stated no one contacted them yesterday about HST. Explained order was placed yesterday for HST, but our PCC's schedule that and they are 3-4 weeks out. Patient completed ILD packet and explained ILD labs were ordered, so Patient could complete labs today. I spoke with [facility]. Earnest Bailey stated she could see Patient today for HST, since no insurance pre-certification was needed. I spoke with Patient and Robert Lang again about HST. Belinda Fisher, [facility]  was coming out to lobby to get them for HST appointment, while Patient was in office. Nothing further at this time.

## 2020-06-10 NOTE — Addendum Note (Signed)
Addended by: Suzzanne Cloud E on: 06/10/2020 10:07 AM   Modules accepted: Orders

## 2020-06-12 LAB — ANTI-DNA ANTIBODY, DOUBLE-STRANDED: ds DNA Ab: <1 IU/mL

## 2020-06-12 LAB — QUANTIFERON-TB GOLD PLUS
Mitogen-NIL: >10 IU/mL
NIL: 0.04 IU/mL
QuantiFERON-TB Gold Plus: NEGATIVE
TB1-NIL: <0 IU/mL
TB2-NIL: <0 IU/mL

## 2020-06-12 LAB — ANA: Anti Nuclear Antibody (ANA): NEGATIVE

## 2020-06-12 LAB — CK TOTAL AND CKMB (NOT AT ARMC)
CK, MB: 2 ng/mL (ref 0–5.0)
Relative Index: 3.2 (ref 0–4.0)
Total CK: 62 U/L (ref 44–196)

## 2020-06-12 LAB — MPO/PR-3 (ANCA) ANTIBODIES
Myeloperoxidase Abs: <1 AI
Serine Protease 3: <1 AI

## 2020-06-12 LAB — RHEUMATOID FACTOR: Rheumatoid fact SerPl-aCnc: <14 IU/mL (ref <14)

## 2020-06-12 LAB — SJOGREN'S SYNDROME ANTIBODS(SSA + SSB)
SSA (Ro) (ENA) Antibody, IgG: <1 AI
SSB (La) (ENA) Antibody, IgG: <1 AI

## 2020-06-12 LAB — CYCLIC CITRUL PEPTIDE ANTIBODY, IGG: Cyclic Citrullin Peptide Ab: <16 UNITS

## 2020-06-12 LAB — SEDIMENTATION RATE: Sed Rate: 9 mm/h (ref 0–20)

## 2020-06-12 LAB — ALDOLASE: Aldolase: 9.5 U/L — ABNORMAL HIGH (ref <=8.1)

## 2020-06-12 LAB — ANTI-SCLERODERMA ANTIBODY: Scleroderma (Scl-70) (ENA) Antibody, IgG: <1 AI

## 2020-06-13 ENCOUNTER — Other Ambulatory Visit (HOSPITAL_COMMUNITY)
Admission: RE | Admit: 2020-06-13 | Discharge: 2020-06-13 | Disposition: A | Discharge status: Home / Self Care | Payer: Medicare Other | Source: Ambulatory Visit | Attending: Internal Medicine | Admitting: Internal Medicine

## 2020-06-13 DIAGNOSIS — Z01812 Encounter for preprocedural laboratory examination: Secondary | ICD-10-CM | POA: Insufficient documentation

## 2020-06-13 DIAGNOSIS — Z20822 Contact with and (suspected) exposure to covid-19: Secondary | ICD-10-CM | POA: Diagnosis not present

## 2020-06-13 DIAGNOSIS — G4733 Obstructive sleep apnea (adult) (pediatric): Secondary | ICD-10-CM | POA: Diagnosis not present

## 2020-06-13 LAB — RNP ANTIBODIES: ENA RNP Ab: 0.3 AI (ref 0.0–0.9)

## 2020-06-13 LAB — HYPERSENSITIVITY PNEUMONITIS
A. Pullulans Abs: POSITIVE — AB
A.Fumigatus #1 Abs: NEGATIVE
Micropolyspora faeni, IgG: NEGATIVE
Pigeon Serum Abs: NEGATIVE
Thermoact. Saccharii: NEGATIVE
Thermoactinomyces vulgaris, IgG: NEGATIVE

## 2020-06-13 LAB — ANTI-JO 1 ANTIBODY, IGG: Anti JO-1: <0.2 AI (ref 0.0–0.9)

## 2020-06-13 LAB — SARS CORONAVIRUS 2 (TAT 6-24 HRS): SARS Coronavirus 2: NEGATIVE

## 2020-06-15 ENCOUNTER — Telehealth: Payer: Self-pay | Admitting: Internal Medicine

## 2020-06-15 NOTE — Telephone Encounter (Signed)
  Aldolase + Aspergillus +  Will review his ILD questionnaire with him on visit 06/17/20  Results for CYNCERE, SONTAG (MRN [id number]) as of 06/15/2020 18:20  Ref. Range 06/10/2020 10:08  CK Total Latest Ref Range: 44 - 196 U/L 62  CK, MB Latest Ref Range: 0 - 5.0 ng/mL 2.0  Aldolase Latest Ref Range: < OR = 8.1 U/L 9.5 (H)  Sed Rate Latest Ref Range: 0 - 20 mm/h 9  Anti Nuclear Antibody (ANA) Latest Ref Range: NEGATIVE  NEGATIVE  Anti JO-1 Latest Ref Range: 0.0 - 0.9 AI <8.2  Cyclic Citrullin Peptide Ab Latest Units: UNITS <16  ds DNA Ab Latest Units: IU/mL <1  ENA RNP Ab Latest Ref Range: 0.0 - 0.9 AI 0.3  Myeloperoxidase Abs Latest Units: AI <1.0  Serine Protease 3 Latest Units: AI <1.0  RA Latex Turbid. Latest Ref Range: <14 IU/mL <14  SSA (Ro) (ENA) Antibody, IgG Latest Ref Range: <1.0 NEG AI <1.0 NEG  SSB (La) (ENA) Antibody, IgG Latest Ref Range: <1.0 NEG AI <1.0 NEG  Scleroderma (Scl-70) (ENA) Antibody, IgG Latest Ref Range: <1.0 NEG AI <1.0 NEG  QUANTIFERON-TB GOLD PLUS Unknown Rpt  A.Fumigatus #1 Abs Latest Ref Range: Negative  Negative  Micropolyspora faeni, IgG Latest Ref Range: Negative  Negative  Thermoactinomyces vulgaris, IgG Latest Ref Range: Negative  Negative  A. Pullulans Abs Latest Ref Range: Negative  Positive (A)  Thermoact. Saccharii Latest Ref Range: Negative  Negative  Pigeon Serum Abs Latest Ref Range: Negative  Negative  Mitogen-NIL Latest Units: IU/mL >10.00

## 2020-06-17 ENCOUNTER — Ambulatory Visit: Payer: Medicare Other | Admitting: Internal Medicine

## 2020-06-17 ENCOUNTER — Encounter: Payer: Self-pay | Admitting: Internal Medicine

## 2020-06-17 ENCOUNTER — Ambulatory Visit (INDEPENDENT_AMBULATORY_CARE_PROVIDER_SITE_OTHER): Payer: Medicare Other | Admitting: Internal Medicine

## 2020-06-17 ENCOUNTER — Other Ambulatory Visit: Payer: Self-pay

## 2020-06-17 VITALS — BP 104/64 | HR 86 | Temp 97.4°F | Ht 71.0 in | Wt 190.0 lb

## 2020-06-17 DIAGNOSIS — R0600 Dyspnea, unspecified: Secondary | ICD-10-CM

## 2020-06-17 DIAGNOSIS — J84112 Idiopathic pulmonary fibrosis: Secondary | ICD-10-CM

## 2020-06-17 DIAGNOSIS — J849 Interstitial pulmonary disease, unspecified: Secondary | ICD-10-CM

## 2020-06-17 DIAGNOSIS — R0609 Other forms of dyspnea: Secondary | ICD-10-CM

## 2020-06-17 LAB — PULMONARY FUNCTION TEST
DL/VA % pred: 100 %
DL/VA: 4.06 ml/min/mmHg/L
DLCO cor % pred: 81 %
DLCO cor: 21.66 ml/min/mmHg
DLCO unc % pred: 82 %
DLCO unc: 22.07 ml/min/mmHg
FEF 25-75 Post: 2.66 L/sec
FEF 25-75 Pre: 2.73 L/sec
FEF2575-%Change-Post: -2 %
FEF2575-%Pred-Post: 103 %
FEF2575-%Pred-Pre: 106 %
FEV1-%Change-Post: -1 %
FEV1-%Pred-Post: 75 %
FEV1-%Pred-Pre: 76 %
FEV1-Post: 2.55 L
FEV1-Pre: 2.6 L
FEV1FVC-%Change-Post: -6 %
FEV1FVC-%Pred-Pre: 109 %
FEV6-%Change-Post: 2 %
FEV6-%Pred-Post: 75 %
FEV6-%Pred-Pre: 73 %
FEV6-Post: 3.29 L
FEV6-Pre: 3.21 L
FEV6FVC-%Change-Post: -2 %
FEV6FVC-%Pred-Post: 103 %
FEV6FVC-%Pred-Pre: 105 %
FVC-%Change-Post: 5 %
FVC-%Pred-Post: 73 %
FVC-%Pred-Pre: 69 %
FVC-Post: 3.38 L
FVC-Pre: 3.21 L
Post FEV1/FVC ratio: 76 %
Post FEV6/FVC ratio: 98 %
Pre FEV1/FVC ratio: 81 %
Pre FEV6/FVC Ratio: 100 %
RV % pred: 139 %
RV: 3.48 L
TLC % pred: 105 %
TLC: 7.61 L
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
## 2020-06-17 NOTE — Patient Instructions (Signed)
Full PFT performed today. °

## 2020-06-17 NOTE — Addendum Note (Signed)
Addended by: Brand Males on: 06/17/2020 12:50 PM   Modules accepted: Level of Service

## 2020-06-17 NOTE — Progress Notes (Signed)
Full PFT performed today. °

## 2020-06-17 NOTE — Patient Instructions (Addendum)
ICD-10-CM   1. IPF (idiopathic pulmonary fibrosis) ([place])  J84.112   2. ILD (interstitial lung disease) ([place])  J84.9     The diagnosis I am giving to you today on 06/17/2020 is idiopathic pulmonary fibrosis  The diagnosis based on the fact that you age over 58, previous smoking, you occupational exposures, negative serology, Caucasian ethnicity, male gender and probable UIP pattern on CT scan  At this point in time I do not think you need a lung biopsy but certainly if there is diagnostic uncertainty in the future we could revisit that  The disease is 1 of progression over time  Plan -extensively discussed -Do not recommend nintedanib as antifibrotic as first-line given your heart history and anticoagulation history -Recommend pirfenidone per protocol  = We will initiate paperwork  -List wife number as primary contact  -Apply sunscreen with the medication [employer] on him he needs start pirfenidone on him -Take medication 3 times daily with food and space it at least 5 to 6 hours apart  -Explained to the charity nature of co-pay program -In the future we will discusse patient support group, clinical trials and pulmonary rehabilitation  - briefly discussed trials today  - STOP FISH OIL  Follow-up -Return to see nurse practitioner in 4 to 6 weeks to ensure pirfenidone start has gone well -Return to see Dr. Chase Caller in 8-12 weeks and a 30-minute slot

## 2020-06-18 ENCOUNTER — Telehealth: Payer: Self-pay | Admitting: Pharmacy Technician

## 2020-06-18 ENCOUNTER — Ambulatory Visit: Payer: Medicare Other | Admitting: Pharmacist

## 2020-06-18 DIAGNOSIS — Z79899 Other long term (current) drug therapy: Secondary | ICD-10-CM

## 2020-06-18 DIAGNOSIS — J84112 Idiopathic pulmonary fibrosis: Secondary | ICD-10-CM

## 2020-06-18 NOTE — Telephone Encounter (Signed)
Patient completed patient portion of [employer] PAP form for [place]. Will place provider form in Dr. Golden Pop box to have signed.  Knox Saliva, PharmD, MPH Clinical Pharmacist (Rheumatology and Pulmonology)

## 2020-06-18 NOTE — Progress Notes (Signed)
HPI  Robert Lang and his wife, Robert Lang, present today to [facility] for Initial appt with pharmacy team for Esbriet new start counseling and new start paperwork.  Patient has IPF with pertinent PMH of 30-pack smoking history, STEMI in 12/2019, CAD (atorvastatin 21m daily), afib (Xarelto 238mdaily), HTN, COPD, GERD, neuropathy,  arthritis, hyperlipidemia, hx of alcohol abuse (6 pack of beer daily), depression, and anxiety.  Last seen by Dr. RaChase Calleresterday 06/17/20 - instructed to stop fish oil (can worsen GERD) and increase bleeding risk. Discussion of Ofev vs Esbriet led to ultimate decision to move forward with Esbriet d/t patient's cardiac history.  Today, Robert Lang concerned about getting medication through "charity" care and adding burden to government for cost.  Robert Lang that his goal is to spend as much time as possible with his young grandchildren. They both have questions about coordinating shipment of the medication refills given less than ideal experience with other mail-order pharmacies.  Patient's wife handles his medications and does use a pillbox to organize his medication. Patient states that his wife has taken significant responsibility in his health after he broke his back and is very grateful to have her.   She has experience in healthcare as an ofGlass blower/designero is equipped to handle some nuances of healthcare system that Robert Lang.   OBJECTIVE Allergies  Allergen Reactions  . Naproxen Other (See Comments)    (Naprosyn *ANALGESICS - ANTI-INFLAMMATORY*) Nausea, Abdominal pain    Outpatient Encounter Medications as of 06/18/2020  Medication Sig  . atorvastatin (LIPITOR) 80 MG tablet Take 1 tablet (80 mg total) by mouth daily at 6 PM.  . buPROPion (WELLBUTRIN XL) 300 MG 24 hr tablet Take 1 tablet (300 mg total) by mouth daily.  . carvedilol (COREG) 3.125 MG tablet TAKE 1 TABLET(3.125 MG) BY MOUTH TWICE DAILY WITH A MEAL  .  Cholecalciferol 25 MCG (1000 UT) tablet Take 1,000 Units by mouth daily.   . famotidine (PEPCID) 40 MG tablet TAKE 1 TABLET(40 MG) BY MOUTH DAILY  . fexofenadine (ALLEGRA) 180 MG tablet Take 180 mg by mouth at bedtime.   . finasteride (PROSCAR) 5 MG tablet Take 1 tablet (5 mg total) by mouth daily.  . Marland Kitchenabapentin (NEURONTIN) 100 MG capsule 2-3 tab po q hs (Patient taking differently: Take 300 mg by mouth at bedtime. 3 tab po q hs)  . Multiple Vitamins-Minerals (PRESERVISION AREDS PO) Take 2 capsules by mouth in the morning and at bedtime.  . nitroGLYCERIN (NITROSTAT) 0.4 MG SL tablet Place 1 tablet (0.4 mg total) under the tongue every 5 (five) minutes as needed for chest pain.  . Omega-3 Fatty Acids (FISH OIL) 1000 MG CAPS Take 1,000 mg by mouth.  . ranolazine (RANEXA) 1000 MG SR tablet Take 1 tablet (1,000 mg total) by mouth 2 (two) times daily.  . sertraline (ZOLOFT) 100 MG tablet Take 150 mg by mouth at bedtime.   . solifenacin (VESICARE) 5 MG tablet Take 5 mg by mouth daily.  . tamsulosin (FLOMAX) 0.4 MG CAPS capsule TAKE 1 CAPSULE(0.4 MG) BY MOUTH DAILY  . traZODone (DESYREL) 50 MG tablet TAKE 1/2 TO 1 TABLET(25 TO 50 MG) BY MOUTH AT BEDTIME AS NEEDED FOR SLEEP  . Turmeric 500 MG CAPS Take 1 capsule by mouth 2 (two) times daily.   . vitamin B-12 (CYANOCOBALAMIN) 1000 MCG tablet Take 1,000 mcg by mouth daily.  . Alveda Reasons0 MG TABS tablet TAKE 1 TABLET(20 MG) BY MOUTH DAILY WITH  SUPPER   No facility-administered encounter medications on file as of 06/18/2020.     Immunization History  Administered Date(s) Administered  . Influenza Split 01/09/2010, 02/09/2011  . Influenza, High Dose Seasonal PF 11/15/2018, 01/11/2020  . Influenza,inj,Quad PF,6+ Mos 01/01/2015  . Influenza-Unspecified 01/30/2014, 02/02/2016  . PFIZER(Purple Top)SARS-COV-2 Vaccination 04/27/2019, 05/18/2019, 01/14/2020  . Pneumococcal Conjugate-13 11/23/2018  . 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  PFT's TLC  Date Value Ref Range Status  05/09/2020 7.61 L Preliminary     CMP     Component Value Date/Time   NA 135 04/30/2020 1126   NA 137 03/05/2020 1343   K 5.0 04/30/2020 1126   CL 102 04/30/2020 1126   CO2 28 04/30/2020 1126   GLUCOSE 93 04/30/2020 1126   BUN 17 04/30/2020 1126   BUN 14 03/05/2020 1343   CREATININE 0.95 04/30/2020 1126   CREATININE 0.95 01/25/2020 1134   CALCIUM 9.8 04/30/2020 1126   PROT 7.1 04/30/2020 1126   PROT 6.9 04/20/2019 0807   ALBUMIN 4.2 04/30/2020 1126   ALBUMIN 4.3 04/20/2019 0807   AST 16 04/30/2020 1126   ALT 20 04/30/2020 1126   ALKPHOS 61 04/30/2020 1126   BILITOT 0.6 04/30/2020 1126   BILITOT 0.5 04/20/2019 0807   GFRNONAA 70 03/05/2020 1343   GFRAA 81 03/05/2020 1343     CBC    Component Value Date/Time   WBC 8.4 04/30/2020 1126   RBC 4.42 04/30/2020 1126   HGB 15.3 04/30/2020 1126   HCT 44.5 04/30/2020 1126   PLT 223.0 04/30/2020 1126   MCV 100.9 (H) 04/30/2020 1126   MCH 34.3 (H) 01/25/2020 1134   MCHC 34.3 04/30/2020 1126   RDW 12.8 04/30/2020 1126   LYMPHSABS 1.2 04/30/2020 1126   MONOABS 0.9 04/30/2020 1126   EOSABS 0.1 04/30/2020 1126   BASOSABS 0.0 04/30/2020 1126     LFT's Hepatic Function Latest Ref Rng & Units 04/30/2020 01/25/2020 04/20/2019  Total Protein 6.0 - 8.3 g/dL 7.1 6.8 6.9  Albumin 3.5 - 5.2 g/dL 4.2 - 4.3  AST 0 - 37 U/L _0 ALT 0 - 53 U/L _1 Alk Phosphatase 39 - 117 U/L 61 - 105  Total Bilirubin 0.2 - 1.2 mg/dL 0.6 0.7 0.5     TB GOLD Quantiferon TB Gold Latest Ref Rng & Units 06/10/2020  Quantiferon TB Gold Plus NEGATIVE NEGATIVE     ASSESSMENT/PLAN  1. Esbriet Medication Management  Discussed in detail the nature of financial assistance options for Esbriet including grants if they are available and patient assistance through the manufacturer. Stressed that it is not Acupuncturist but an access avenue for the manufacturer to ensure patients who need  the medication receive it.  Also discussed that the medication is shipped from a mail-order pharmacy - generally pharmacy advises patients to call for next shipment when bottle is received at home. Will reinforce this during f/u calls. If they continue to have difficulty, medication could always be shipped to the clinic, but family would have to stop by clinic to pick up monthly which may not be convenient either  Patient counseled on purpose, proper use, and potential adverse effects including nausea, vomiting, abdominal pain, GERD, weight loss, arthralgia, dizziness, and suns sensitivity/rash.  Stressed the importance of routine lab monitoring. Will monitor LFT's every month for the first 6 months of treatment then every 3 months. Will monitor CBC every 3 months. LFTs on 04/30/20  wnl to proceed with Esbriet once approved - CMP and CBC wnl  Starting dose will be Esbriet 267 mg 1 tablet three times daily for 7 days, then 2 tablets three times daily for 7 days, then 3 tablets three times daily.  Maintenance dose will be 801 mg 1 tablet three times daily if tolerated.  Discussed in detail how we could reduce pill burden if Esbriet is started.  Stressed the importance of taking with meals to minimize stomach upset - patient does not eat 3 full meals but does snack throughout the day.  - Patient portion of patient assistance application completed today. Provider portion placed in Dr. Golden Pop box to be completed. - Prior authorization submitted by Frederik Schmidt, CPhT, today. Will await response from insurance. Patient and wife advised that copay is generally very expensive after Josem Kaufmann is approved but they would like to know how much. - Adherence will not be an issue with this patient - his wife handles his pillbox and has a good understanding of the dosing schedule.  Patient has great supportive system with family and is self-motivated to start medication.  2. Medication Reconciliation  A drug regimen  assessment was performed, including review of allergies, interactions, disease-state management, dosing and immunization history. Medications were reviewed with the patient, including name, instructions, indication, goals of therapy, potential side effects, importance of adherence, and safe use.  Patient has stopped fish oil as instructed by Dr. Chase Lang. Removed from med list.  Drug interaction(s):  None identified  3. Immunizations  Patient is UTD on the influenzae and pneumonia  Follow-up with Derl Barrow scheduled 07/15/20. Depending on when patient starts Esbriet, may need to bump this out.  All questions encouraged and answered.  Instructed patient to call with any further questions or concerns.  Thank you for allowing pharmacy to participate in this patient's care.  Knox Saliva, PharmD, MPH Clinical Pharmacist (Rheumatology and Pulmonology)

## 2020-06-18 NOTE — Telephone Encounter (Signed)
Received notification for ESBRIET. Will update as we work through the benefits process.  Patient will need to complete Esbriet paperwork.  Submitted a Prior Authorization request to [facility] Medicare for ESBRIET via Cover My Meds. Will update once we receive a response.   Key: Vilinda [id number]

## 2020-06-19 ENCOUNTER — Telehealth: Payer: Self-pay | Admitting: Internal Medicine

## 2020-06-19 NOTE — Telephone Encounter (Signed)
Received notification from [facility] Medicare regarding a prior authorization for ESBRIET. Authorization has been APPROVED from 06/18/20 to 06/18/21.   Authorization # [id number]  Patient's copay for 1 month supply is $2,286.70. Awaiting provider portion for PAP application.

## 2020-06-20 DIAGNOSIS — H43821 Vitreomacular adhesion, right eye: Secondary | ICD-10-CM | POA: Diagnosis not present

## 2020-06-20 DIAGNOSIS — H35033 Hypertensive retinopathy, bilateral: Secondary | ICD-10-CM | POA: Diagnosis not present

## 2020-06-20 DIAGNOSIS — H31011 Macula scars of posterior pole (postinflammatory) (post-traumatic), right eye: Secondary | ICD-10-CM | POA: Diagnosis not present

## 2020-06-20 NOTE — Telephone Encounter (Signed)
Left VM for patient and wife requesting return call back about Esbriet interaction with wine

## 2020-06-23 ENCOUNTER — Other Ambulatory Visit: Payer: Self-pay | Admitting: Medical

## 2020-06-23 NOTE — Telephone Encounter (Signed)
Returned wife's call about Esbriet interaction with alcohol. Most recent LFTs in March 2022 were wnl. He has been drinking 8 oz of wine for some time and these labs were reflective of this. We discussed that can consider reduction of alcohol use if we see change in LFTs, but as long as he does not abuse alcohol, he can continue limited alcohol use  We discussed that [place] was approved through insurance and are awaiting provider portion to submit to [place]. She verbalized understanding.  Knox Saliva, PharmD, MPH Clinical Pharmacist (Rheumatology and Pulmonology)

## 2020-06-24 NOTE — Telephone Encounter (Signed)
Submitted Patient Assistance Application to [employer] for ESBRIET along with provider portion. Will update patient when we receive a response.  Fax# [phone] Phone# [phone]  

## 2020-07-02 ENCOUNTER — Telehealth: Payer: Self-pay | Admitting: Internal Medicine

## 2020-07-02 NOTE — Telephone Encounter (Signed)
Left VM with patient to discuss Esbriet approval. Left direct office number. Will f/u

## 2020-07-02 NOTE — Telephone Encounter (Signed)
Per CJ at [facility], pt is approved indefinitely so long as all pt information on file remains accurate and up-to-date. [employer] will reach out once annually to reverify information. Pt should receive call from [place] partner pharmacy within 2-4 business days.

## 2020-07-03 NOTE — Telephone Encounter (Signed)
ATC patient to discuss Esbriet. Unable to reach but left VM with direct office number. Will continue to f/u in [place] encounter  Knox Saliva, PharmD, MPH Clinical Pharmacist (Rheumatology and Pulmonology)

## 2020-07-03 NOTE — Telephone Encounter (Signed)
ATC patient to discuss Esbriet. Unable to reach but left VM with direct office number. Will continue to f/u  Knox Saliva, PharmD, MPH Clinical Pharmacist (Rheumatology and Pulmonology)

## 2020-07-04 NOTE — Telephone Encounter (Signed)
Patient's wife returned call. She spoke with [place] regarding aEsbriet pproval and has [facility] pharmacy phone number. She will call [facility] today to schedule shipment to home.  She was able to read back correct dosing titration for the first month. Encouraged and answered any questions.  F/u with Derl Barrow 07/15/20

## 2020-07-10 ENCOUNTER — Telehealth: Payer: Self-pay | Admitting: Internal Medicine

## 2020-07-10 NOTE — Telephone Encounter (Signed)
I have called and spoke with pts wife, Robert Lang and she is aware of change in appt with BW and with MR.  Nothing further is needed.

## 2020-07-10 NOTE — Telephone Encounter (Signed)
Please reschedule the follow-up with Derl Barrow to approximately 3-4 weeks after starting the pirfenidone.    Also ensure you give a 30-minute follow-up visit to see me 8 weeks from now

## 2020-07-10 NOTE — Telephone Encounter (Signed)
MR the pt is just going to be starting the Esbriet on 04/24 and is scheduled to see BW at that time as well.  Is this safe to say that we can reschedule that appt?  In the last OV note with MR, it stated to follow up in 4-6 weeks after being on the esbriet to make sure that it was working properly.  Please advise. Thanks

## 2020-07-15 ENCOUNTER — Ambulatory Visit: Payer: Medicare Other | Admitting: Primary Care

## 2020-07-15 ENCOUNTER — Telehealth: Payer: Self-pay | Admitting: Internal Medicine

## 2020-07-15 NOTE — Telephone Encounter (Signed)
Left VM with [name]. He has f/u with Derl Barrow on 07/28/20 and refill could be sent at that visit.

## 2020-07-15 NOTE — Telephone Encounter (Signed)
LMTCB for the pt. Which pharm?   Starting dose will be Esbriet 267 mg 1 tablet three times daily for 7 days, then 2 tablets three times daily for 7 days, then 3 tablets three times daily.  Maintenance dose will be 801 mg 1 tablet three times daily if tolerated.  Discussed in detail how we could reduce pill burden if Esbriet is started.  Stressed the importance of taking with meals to minimize stomach upset - patient does not eat 3 full meals but does snack throughout the day.--Copied from pharm notes 06/18/20.

## 2020-07-15 NOTE — Telephone Encounter (Signed)
Pt's wife [name]) ok per DPR returning a phone call. [name] states that they go through our pharmacy team to get the prescription. [name] is stating that the pharmacy said MR needs to send a new prescription to get the next month's supply sent in so that there will not be any delay. [name] can be reached at [phone].

## 2020-07-16 NOTE — Telephone Encounter (Addendum)
Spoke with Mr. Wirz and spouse, [name]. Discussed that Esbriet maintenance rx could be sent at f/u appointment with Derl Barrow where she will assess how Esbriet is being tolerated. She verbalized understanding. She states he started first month titration on 07/14/20 and has tolerated it well so far. At f/u visit he will have completed 2 weeks of titration dose.  Will route to Derl Barrow as FYI for that visit - rx to be sent to [facility] (since he receives through patient assistance)  Knox Saliva, PharmD, MPH Clinical Pharmacist (Rheumatology and Pulmonology)

## 2020-07-16 NOTE — Telephone Encounter (Signed)
Thanks

## 2020-07-28 ENCOUNTER — Other Ambulatory Visit: Payer: Self-pay

## 2020-07-28 ENCOUNTER — Telehealth: Payer: Self-pay | Admitting: Primary Care

## 2020-07-28 ENCOUNTER — Ambulatory Visit: Payer: Medicare Other | Admitting: Primary Care

## 2020-07-28 ENCOUNTER — Encounter: Payer: Self-pay | Admitting: Primary Care

## 2020-07-28 VITALS — BP 126/60 | HR 93 | Temp 97.6°F | Ht 71.0 in | Wt 189.6 lb

## 2020-07-28 DIAGNOSIS — J84112 Idiopathic pulmonary fibrosis: Secondary | ICD-10-CM | POA: Insufficient documentation

## 2020-07-28 LAB — CBC
HCT: 47.3 % (ref 39.0–52.0)
Hemoglobin: 15.8 g/dL (ref 13.0–17.0)
MCHC: 33.3 g/dL (ref 30.0–36.0)
MCV: 101.5 fl — ABNORMAL HIGH (ref 78.0–100.0)
Platelets: 215 10*3/uL (ref 150.0–400.0)
RBC: 4.66 Mil/uL (ref 4.22–5.81)
RDW: 13.5 % (ref 11.5–15.5)
WBC: 8 10*3/uL (ref 4.0–10.5)

## 2020-07-28 LAB — HEPATIC FUNCTION PANEL
ALT: 23 U/L (ref 0–53)
AST: 16 U/L (ref 0–37)
Albumin: 4.1 g/dL (ref 3.5–5.2)
Alkaline Phosphatase: 56 U/L (ref 39–117)
Bilirubin, Direct: 0.1 mg/dL (ref 0.0–0.3)
Total Bilirubin: 0.6 mg/dL (ref 0.2–1.2)
Total Protein: 7.4 g/dL (ref 6.0–8.3)

## 2020-07-28 MED ORDER — ESBRIET 801 MG PO TABS: 801.0000 mg | ORAL_TABLET | Freq: Three times a day (TID) | ORAL | 4 refills | Status: DC

## 2020-07-28 NOTE — Assessment & Plan Note (Signed)
-   Started on Esbriet in April 2022, current taking 267mg  three tablets TID. Tolerating medication well without significant weight loss or GI side effects. He needs LFTs and CBC today. Walk test today showed no evidence of desaturation <88% on room air. He reports some improvement in shortness of breath and cough. Still have some anxiety and depression over diagnosis. He has a follow-up with Dr. Judd Gaudier scheduled for end of June. Would discuss referral to pulmonary rehab during the visit, unsure if he will be able to participate d.t back injuries. We will have pharmacist send in updated prescription for Esbriet 801mg  TID.

## 2020-07-28 NOTE — Progress Notes (Signed)
@Patient  ID: Robert Lang, male    DOB: Feb 05, 1951, 70 y.o.   MRN: [id number]  Chief Complaint  Patient presents with  . Follow-up    Started Esbriet 3-4 wks. Ago-feels dizzy occass.,sob slightly better,occass. Cough with yellow and green    Referring provider: Elise Benne  HPI: 70 year old male, former smoker. PMH significant for IPF. Patient of Dr. Chase Caller, last seen on 06/17/20. He was started on Esbriet in April 2022.    07/28/2020 Feels his breathing some better, wife reports he doesn't looks as distressed.  Cough has also improved, remains productive with yellow mucus. He does not cough as much and does not clear his throat as much. He is eating well, has six small meals. When he first started the medication he has some nausea but this has resolved. He tells me he could eat a steak right now. He does not do many household tasks and does not do much shopping d/t balance issues and past back surgeries. He tells me that in 2007 when he fractured his vertebrae there was mention of same lung findings then. He walks with his cane. He is now taking Esbriet 267 mg three times days TID. Needs new prescription for esbriet. Denies significant weight loss, nausea, vomiting, diarrhea.   SYMPTOM SCALE - ILD 06/17/2020  07/28/2020   O2 use ra RA  Shortness of Breath 0 -> 5 scale with 5 being worst (score 6 If unable to do) 0-5  At rest 3 0.5  Simple tasks - showers, clothes change, eating, shaving 5 3  Household (dishes, doing bed, laundry) 5 3 (he does not do these tasks)  Shopping 5 3 (he does not do this often)  Walking level at own pace 4 3  Walking up Stairs 5 5  Total (30-36) Dyspnea Score 27 17.5  How bad is your cough? 3 2  How bad is your fatigue 4 2  How bad is nausea 0 0  How bad is vomiting?  0 0  How bad is diarrhea? 3 0  How bad is anxiety? 5 5  How bad is depression 5 5      Simple office walk 185 feet x  3 laps goal with forehead probe  07/28/2020   O2  used RA  Number laps completed 2 laps  Comments about pace Slow with cane  Resting Pulse Ox/HR 95% and 93/min  Final Pulse Ox/HR 95% and 103/min  Desaturated </= 88% No  Desaturated <= 3% points no  Got Tachycardic >/= 90/min Yes  Symptoms at end of test Dyspnea   Miscellaneous comments Talking throughout walk test, he had to rest twice to catch his breath       Allergies  Allergen Reactions  . Naproxen Other (See Comments)    (Naprosyn *ANALGESICS - ANTI-INFLAMMATORY*) Nausea, Abdominal pain    Immunization History  Administered Date(s) Administered  . Influenza Split 01/09/2010, 02/09/2011  . Influenza, High Dose Seasonal PF 11/15/2018, 01/11/2020  . Influenza,inj,Quad PF,6+ Mos 01/01/2015  . Influenza-Unspecified 01/30/2014, 02/02/2016  . PFIZER(Purple Top)SARS-COV-2 Vaccination 04/27/2019, 05/18/2019, 01/14/2020, 07/22/2020  . Pneumococcal Conjugate-13 11/23/2018  . Tdap 02/18/2011    Past Medical History:  Diagnosis Date  . Acute ST elevation myocardial infarction (STEMI) of inferolateral wall ([place]) 01/10/2019  . 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 M75.00)  . Allergic rhinitis due to pollen 09/03/2013   J30.1)  Formatting of this note might  be different from the original. J30.1)  . Allergy   . Anticoagulated 07/01/2016  . Anxiety   . Arthritis   . Atrial fibrillation ([place])   . Atrial flutter ([place]) 06/26/2015  . CAD (coronary artery disease)   . Chest pain 06/26/2015  . COPD (chronic obstructive pulmonary disease) ([place])   . Coronary artery disease 07/06/2018   Cardiac catheterization 2017 showing 90% small diagonal branch disease  . DDD (degenerative disc disease), cervical 09/03/2013   M50.90)  Formatting of this note might be different from the original. M50.90)  . Depression   . Depression   . Depressive disorder 09/03/2013  . Dizziness 10/28/2017  . Dyslipidemia, goal LDL below 70  07/06/2018  . Dyspnea on exertion 10/20/2018  . Esophageal reflux 09/03/2013  . Essential hypertension 07/06/2018  . ETOH abuse 01/11/2019   6 pack of beer per day  . Falls 10/28/2017  . GERD (gastroesophageal reflux disease)   . H/O amiodarone therapy 07/01/2016  . Heart attack ([place])   . Heart palpitations 01/27/2017  . History of colon polyps   . Hyperlipidemia   . Hypertension   . Kidney stones   . Neck pain 09/03/2013  . Neuropathy 07/06/2018  . Obstructive sleep apnea 08/05/2015  . PLMD (periodic limb movement disorder) 11/04/2015  . 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 STORY: Due to alcohol/ tobacco  . Pure hypercholesterolemia 09/03/2013   E78.0)  Formatting of this note might be different from the original. E78.0)  . Screening for prostate cancer 09/03/2013  . Smoker 01/11/2019  . Smoking 07/06/2018  . Status post ablation of atrial flutter 07/06/2018   2017    Tobacco History: Social History   Tobacco Use  Smoking Status Former Smoker  . Packs/day: 1.00  . Years: 30.00  . Pack years: 30.00  . Types: Cigarettes  . Quit date: 01/09/2018  . 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 atorvastatin (LIPITOR) 80 MG tablet Take 1 tablet (80 mg total) by mouth daily at 6 PM. 30 tablet 11  . buPROPion (WELLBUTRIN XL) 300 MG 24 hr tablet Take 1 tablet (300 mg total) by mouth daily. 90 tablet 0  . carvedilol (COREG) 3.125 MG tablet TAKE 1 TABLET(3.125 MG) BY MOUTH TWICE DAILY WITH A MEAL 120 tablet 3  . Cholecalciferol 25 MCG (1000 UT) tablet Take 1,000 Units by mouth daily.     . famotidine (PEPCID) 40 MG tablet TAKE 1 TABLET(40 MG) BY MOUTH DAILY 90 tablet 3  . fexofenadine (ALLEGRA) 180 MG tablet Take 180 mg by mouth at bedtime.     . finasteride (PROSCAR) 5 MG tablet  Take 1 tablet (5 mg total) by mouth daily. 90 tablet 1  . gabapentin (NEURONTIN) 100 MG capsule Take 2-3 capsules (200-300 mg total) by mouth at bedtime. 90 capsule 3  . Multiple Vitamins-Minerals (PRESERVISION AREDS PO) Take 2 capsules by mouth in the morning and at bedtime.    . nitroGLYCERIN (NITROSTAT) 0.4 MG SL tablet Place 1 tablet (0.4 mg total) under the tongue every 5 (five) minutes as needed for chest pain. 25 tablet 3  . Pirfenidone 267 MG TABS Take by mouth. Take 1 tab by mouth three times daily for 7 days, then take 2 tabs by mouth three times daily for 7 days,  then take 3 tabs by mouth three times daily thereafter.    . ranolazine (RANEXA) 1000 MG SR tablet Take 1 tablet (1,000 mg total) by mouth 2 (two) times daily. 180 tablet 1  . sertraline (ZOLOFT) 100 MG tablet Take 150 mg by mouth at bedtime.     . solifenacin (VESICARE) 5 MG tablet Take 5 mg by mouth daily.    . tamsulosin (FLOMAX) 0.4 MG CAPS capsule TAKE 1 CAPSULE(0.4 MG) BY MOUTH DAILY 90 capsule 0  . traZODone (DESYREL) 50 MG tablet TAKE 1/2 TO 1 TABLET(25 TO 50 MG) BY MOUTH AT BEDTIME AS NEEDED FOR SLEEP 30 tablet 3  . Turmeric 500 MG CAPS Take 1 capsule by mouth 2 (two) times daily.     . vitamin B-12 (CYANOCOBALAMIN) 1000 MCG tablet Take 1,000 mcg by mouth daily.    Alveda Reasons 20 MG TABS tablet TAKE 1 TABLET(20 MG) BY MOUTH DAILY WITH SUPPER 30 tablet 5   No facility-administered medications prior to visit.    Review of Systems  Review of Systems  Constitutional: Positive for fatigue.  Respiratory: Positive for cough and shortness of breath. Negative for wheezing.     Physical Exam  BP 126/60 (BP Location: Left Arm, Cuff Size: Normal)   Pulse 93   Temp 97.6 F (36.4 C) (Temporal)   Ht 5\' 11"  (1.803 m)   Wt 189 lb 9.6 oz (86 kg)   SpO2 95%   BMI 26.44 kg/m  Physical Exam Constitutional:      Appearance: Normal appearance.  HENT:     Head: Normocephalic and atraumatic.     Mouth/Throat:     Comments:  Deferred d/t masking Cardiovascular:     Rate and Rhythm: Normal rate and regular rhythm.  Pulmonary:     Effort: Pulmonary effort is normal. No respiratory distress.     Breath sounds: No wheezing or rhonchi.     Comments: Mostly clear  Musculoskeletal:        General: Normal range of motion.  Skin:    General: Skin is warm and dry.  Neurological:     General: No focal deficit present.     Mental Status: He is alert and oriented to person, place, and time. Mental status is at baseline.  Psychiatric:        Mood and Affect: Mood normal.        Behavior: Behavior normal.        Thought Content: Thought content normal.        Judgment: Judgment normal.      Lab Results:  CBC    Component Value Date/Time   WBC 8.4 04/30/2020 1126   RBC 4.42 04/30/2020 1126   HGB 15.3 04/30/2020 1126   HCT 44.5 04/30/2020 1126   PLT 223.0 04/30/2020 1126   MCV 100.9 (H) 04/30/2020 1126   MCH 34.3 (H) 01/25/2020 1134   MCHC 34.3 04/30/2020 1126   RDW 12.8 04/30/2020 1126   LYMPHSABS 1.2 04/30/2020 1126   MONOABS 0.9 04/30/2020 1126   EOSABS 0.1 04/30/2020 1126   BASOSABS 0.0 04/30/2020 1126    BMET    Component Value Date/Time   NA 135 04/30/2020 1126   NA 137 03/05/2020 1343   K 5.0 04/30/2020 1126   CL 102 04/30/2020 1126   CO2 28 04/30/2020 1126   GLUCOSE 93 04/30/2020 1126   BUN 17 04/30/2020 1126   BUN 14 03/05/2020 1343   CREATININE 0.95 04/30/2020 1126   CREATININE 0.95 01/25/2020 1134  CALCIUM 9.8 04/30/2020 1126   GFRNONAA 70 03/05/2020 1343   GFRAA 81 03/05/2020 1343    BNP No results found for: BNP  ProBNP    Component Value Date/Time   PROBNP 109 03/05/2020 1343    Imaging: No results found.   Assessment & Plan:   IPF (idiopathic pulmonary fibrosis) ([place]) - Started on Esbriet in April 2022, current taking 267mg  three tablets TID. Tolerating medication well without significant weight loss or GI side effects. He needs LFTs and CBC today. Walk test  today showed no evidence of desaturation <88% on room air. He reports some improvement in shortness of breath and cough. Still have some anxiety and depression over diagnosis. He has a follow-up with Dr. Judd Gaudier scheduled for end of June. Would discuss referral to pulmonary rehab during the visit, unsure if he will be able to participate d.t back injuries. We will have pharmacist send in updated prescription for Esbriet 801mg  TID.    Martyn Ehrich, NP 07/28/2020

## 2020-07-28 NOTE — Telephone Encounter (Signed)
Needs new prescription for esbriet. He is now taking Esbriet 267 mg three times days TID.

## 2020-07-28 NOTE — Telephone Encounter (Signed)
Rx sent for Esbriet 801mg  three times daily to [facility].  Patient had visit with Derl Barrow today and reported tolerating Esbriet well.  Called patient and wife, [name]. We discussed change in tablet number to reduce pill burden. She will reach out tomorrow afternoon to schedule shipment of the medication to their home.  LFT and CBC drawn today and wnl  Knox Saliva, PharmD, MPH Clinical Pharmacist (Rheumatology and Pulmonology)

## 2020-07-28 NOTE — Patient Instructions (Addendum)
It was a pleasure meeting you both today, I am glad you are tolerating Esbriet medication  We will send new prescription in for Esbriet 801mg  which you will take three times a day  Notify our office if you develop GI symptoms or significant weight loss  Labs every month x 6 months; then every 3 months   Orders: LFTs and CBC  Follow-up: As scheduled in June with Dr. Chase Caller    Pirfenidone capsules or tablets What is this medicine? PIRFENIDONE (peer [place] nih done) is used to treat idiopathic pulmonary fibrosis. This medicine may be used for other purposes; ask your health care provider or pharmacist if you have questions. COMMON BRAND NAME(S): ESBRIET What should I tell my health care provider before I take this medicine? They need to know if you have any of these conditions:  kidney disease  liver disease  smoke tobacco  an unusual or allergic reaction to pirfenidone, or other medicines, foods, dyes, or preservatives  pregnant or trying to get pregnant  breast-feeding How should I use this medicine? Take this medicine by mouth with a glass of water. Follow the directions on the prescription label. Take this medicine with food. Take your medicine at regular intervals. Do not take it more often than directed. Do not stop taking except on your doctor's advice. Talk to your pediatrician regarding the use of the medicine in children. Special care may be needed. Overdosage: If you think you have taken too much of this medicine contact a poison control center or emergency room at once. NOTE: This medicine is only for you. Do not share this medicine with others. What if I miss a dose? If you miss a dose, take it as soon as you can. If it is almost time for your next dose, take only that dose. Do not take double or extra doses. If you miss 14 or more days of taking the medicine, call your healthcare provider before starting again. What may interact with this medicine? This medicine  may interact with the following medications:  certain antiinfectives like ciprofloxacin, thiabendazole  certain medicines for asthma like zafirlukast, zileuton, montelukast  certain medicines for depression, anxiety, or psychotic disturbances  certain medicines for irregular heart beat like amiodarone, mexiletine  certain medicines for seizures like carbamazepine, phenytoin, fosphenytoin  disulfiram  stomach acid blockers like cimetidine  vemurafenib This list may not describe all possible interactions. Give your health care provider a list of all the medicines, herbs, non-prescription drugs, or dietary supplements you use. Also tell them if you smoke, drink alcohol, or use illegal drugs. Some items may interact with your medicine. What should I watch for while using this medicine? Tell your doctor or healthcare professional if your symptoms do not start to get better or if they get worse. If you smoke, tell your doctor if you notice this medicine is not working well for you. Talk to your doctor if you are a smoker or if you decide to stop smoking. This medicine can make you more sensitive to the sun. Keep out of the sun. If you cannot avoid being in the sun, wear protective clothing and use sunscreen. Do not use sun lamps or tanning beds/booths. What side effects may I notice from receiving this medicine? Side effects that you should report to your doctor or health care professional as soon as possible:  allergic reactions like skin rash, itching or hives, swelling of the face, lips, or tongue  signs and symptoms of liver injury like  dark yellow or brown urine; general ill feeling or flu-like symptoms; light colored stools; loss of appetite; nausea; right upper belly pain; unusually weak or tired; yellowing of the eyes or skin  sunburn Side effects that usually do not require medical attention (report to your doctor or health care professional if they continue or are  bothersome):  changes in taste  diarrhea  dizziness  headache  joint pain  nausea; vomiting  stomach pain  trouble sleeping  weak or tired  weight loss This list may not describe all possible side effects. Call your doctor for medical advice about side effects. You may report side effects to FDA at 1-800-FDA-1088. Where should I keep my medicine? Keep out of the reach of children. Store at room temperature between 15 and 30 degrees C (59 and 86 degrees F). Throw away any unused medicine after the expiration date. NOTE: This sheet is a summary. It may not cover all possible information. If you have questions about this medicine, talk to your doctor, pharmacist, or health care provider.  2021 Elsevier/Gold Standard (2015-07-09 13:06:09)

## 2020-08-11 DIAGNOSIS — R351 Nocturia: Secondary | ICD-10-CM | POA: Diagnosis not present

## 2020-08-11 DIAGNOSIS — R3912 Poor urinary stream: Secondary | ICD-10-CM | POA: Diagnosis not present

## 2020-08-11 DIAGNOSIS — N401 Enlarged prostate with lower urinary tract symptoms: Secondary | ICD-10-CM | POA: Diagnosis not present

## 2020-08-12 ENCOUNTER — Encounter: Payer: Self-pay | Admitting: Medical

## 2020-08-12 ENCOUNTER — Other Ambulatory Visit: Payer: Self-pay

## 2020-08-12 ENCOUNTER — Ambulatory Visit (INDEPENDENT_AMBULATORY_CARE_PROVIDER_SITE_OTHER): Payer: Medicare Other | Admitting: Medical

## 2020-08-12 VITALS — BP 105/60 | HR 98 | Temp 98.2°F | Resp 18 | Ht 71.0 in | Wt 188.0 lb

## 2020-08-12 DIAGNOSIS — Z23 Encounter for immunization: Secondary | ICD-10-CM

## 2020-08-12 DIAGNOSIS — Z7185 Encounter for immunization safety counseling: Secondary | ICD-10-CM

## 2020-08-12 DIAGNOSIS — E782 Mixed hyperlipidemia: Secondary | ICD-10-CM

## 2020-08-12 DIAGNOSIS — I4891 Unspecified atrial fibrillation: Secondary | ICD-10-CM | POA: Diagnosis not present

## 2020-08-12 DIAGNOSIS — I2510 Atherosclerotic heart disease of native coronary artery without angina pectoris: Secondary | ICD-10-CM | POA: Diagnosis not present

## 2020-08-12 DIAGNOSIS — I10 Essential (primary) hypertension: Secondary | ICD-10-CM | POA: Diagnosis not present

## 2020-08-12 DIAGNOSIS — J432 Centrilobular emphysema: Secondary | ICD-10-CM | POA: Diagnosis not present

## 2020-08-12 LAB — CBC WITH DIFFERENTIAL/PLATELET
Basophils Absolute: 0.1 10*3/uL (ref 0.0–0.1)
Basophils Relative: 0.7 % (ref 0.0–3.0)
Eosinophils Absolute: 0.2 10*3/uL (ref 0.0–0.7)
Eosinophils Relative: 2.2 % (ref 0.0–5.0)
HCT: 44.7 % (ref 39.0–52.0)
Hemoglobin: 15.4 g/dL (ref 13.0–17.0)
Lymphocytes Relative: 9.9 % — ABNORMAL LOW (ref 12.0–46.0)
Lymphs Abs: 0.8 10*3/uL (ref 0.7–4.0)
MCHC: 34.5 g/dL (ref 30.0–36.0)
MCV: 100.5 fl — ABNORMAL HIGH (ref 78.0–100.0)
Monocytes Absolute: 1.4 10*3/uL — ABNORMAL HIGH (ref 0.1–1.0)
Monocytes Relative: 16.7 % — ABNORMAL HIGH (ref 3.0–12.0)
Neutro Abs: 5.8 10*3/uL (ref 1.4–7.7)
Neutrophils Relative %: 70.5 % (ref 43.0–77.0)
Platelets: 233 10*3/uL (ref 150.0–400.0)
RBC: 4.45 Mil/uL (ref 4.22–5.81)
RDW: 12.8 % (ref 11.5–15.5)
WBC: 8.2 10*3/uL (ref 4.0–10.5)

## 2020-08-12 LAB — COMPREHENSIVE METABOLIC PANEL
ALT: 24 U/L (ref 0–53)
AST: 17 U/L (ref 0–37)
Albumin: 4.1 g/dL (ref 3.5–5.2)
Alkaline Phosphatase: 66 U/L (ref 39–117)
BUN: 13 mg/dL (ref 6–23)
CO2: 25 mEq/L (ref 19–32)
Calcium: 9.5 mg/dL (ref 8.4–10.5)
Chloride: 99 mEq/L (ref 96–112)
Creatinine, Ser: 0.76 mg/dL (ref 0.40–1.50)
GFR: 91.15 mL/min (ref >60.00)
Glucose, Bld: 107 mg/dL — ABNORMAL HIGH (ref 70–99)
Potassium: 4.5 mEq/L (ref 3.5–5.1)
Sodium: 133 mEq/L — ABNORMAL LOW (ref 135–145)
Total Bilirubin: 0.5 mg/dL (ref 0.2–1.2)
Total Protein: 7.1 g/dL (ref 6.0–8.3)

## 2020-08-12 NOTE — Progress Notes (Signed)
Subjective:    Patient ID: Robert Lang, male    DOB: 1950-09-09, 70 y.o.   MRN: [id number]  HPI  Pt  Has being seeing pulmonologist   IPF (idiopathic pulmonary fibrosis) ([place]) - Started on Esbriet in April 2022, current taking 267mg  three tablets TID. Tolerating medication well without significant weight loss or GI side effects. He needs LFTs and CBC today. Walk test today showed no evidence of desaturation <88% on room air. He reports some improvement in shortness of breath and cough. Still have some anxiety and depression over diagnosis. He has a follow-up with Dr. Judd Gaudier scheduled for end of June. Would discuss referral to pulmonary rehab during the visit, unsure if he will be able to participate d.t back injuries. We will have pharmacist send in updated prescription for Esbriet 801mg  TID.    Pt has hx of copd and above relatively new diagnosis on review.  With the above has some sob with mild acitivity.  Pt has hyperlipidemia. On atorvastatin 80 mg daily.  Hx of CAD. No chest pain.  Hx of atrial fibrillation. Pt is on coreg.  Pt is on xarelto.  Hx of bph. He uses flomax and finasteride.      Review of Systems  Constitutional: Negative for chills, fatigue and fever.  HENT: Negative for congestion, drooling and ear discharge.   Respiratory: Negative for cough, chest tightness, shortness of breath and wheezing.        Some shortness of breath with activity. He states new meds for IPF causes some mild dizziness.  Cardiovascular: Negative for chest pain and palpitations.  Gastrointestinal: Negative for abdominal pain.  Genitourinary: Negative for dysuria.  Musculoskeletal: Negative for back pain.  Skin: Negative for rash.  Neurological: Negative for dizziness, syncope, weakness, numbness and headaches.       Mild dizziness with use of new med for ipf.  Hematological: Negative for adenopathy. Does not bruise/bleed easily.  Psychiatric/Behavioral: Negative for  behavioral problems, decreased concentration, sleep disturbance and suicidal ideas. The patient is not nervous/anxious.        Epressed some pessism about health today. But not depressed or anxious.      Past Medical History:  Diagnosis Date  . Acute ST elevation myocardial infarction (STEMI) of inferolateral wall ([place]) 01/10/2019  . 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 M75.00)  . Allergic rhinitis due to pollen 09/03/2013   J30.1)  Formatting of this note might be different from the original. J30.1)  . Allergy   . Anticoagulated 07/01/2016  . Anxiety   . Arthritis   . Atrial fibrillation ([place])   . Atrial flutter ([place]) 06/26/2015  . CAD (coronary artery disease)   . Chest pain 06/26/2015  . COPD (chronic obstructive pulmonary disease) ([place])   . Coronary artery disease 07/06/2018   Cardiac catheterization 2017 showing 90% small diagonal branch disease  . DDD (degenerative disc disease), cervical 09/03/2013   M50.90)  Formatting of this note might be different from the original. M50.90)  . Depression   . Depression   . Depressive disorder 09/03/2013  . Dizziness 10/28/2017  . Dyslipidemia, goal LDL below 70 07/06/2018  . Dyspnea on exertion 10/20/2018  . Esophageal reflux 09/03/2013  . Essential hypertension 07/06/2018  . ETOH abuse 01/11/2019   6 pack of beer per day  . Falls 10/28/2017  . GERD (gastroesophageal reflux disease)   . H/O amiodarone therapy 07/01/2016  . Heart attack (  [place])   . Heart palpitations 01/27/2017  . History of colon polyps   . Hyperlipidemia   . Hypertension   . Kidney stones   . Neck pain 09/03/2013  . Neuropathy 07/06/2018  . Obstructive sleep apnea 08/05/2015  . PLMD (periodic limb movement disorder) 11/04/2015  . 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 STORY: Due to alcohol/ tobacco  . Pure  hypercholesterolemia 09/03/2013   E78.0)  Formatting of this note might be different from the original. E78.0)  . Screening for prostate cancer 09/03/2013  . Smoker 01/11/2019  . Smoking 07/06/2018  . Status post ablation of atrial flutter 07/06/2018   2017     Social History   Socioeconomic History  . Marital status: Married    Spouse name: Not on file  . Number of children: Not on file  . Years of education: Not on file  . Highest education level: Not on file  Occupational History  . Not on file  Tobacco Use  . Smoking status: Former Smoker    Packs/day: 1.00    Years: 30.00    Pack years: 30.00    Types: Cigarettes    Quit date: 01/09/2018    Years since quitting: 2.5  . Smokeless tobacco: Former Network engineer  . Vaping Use: Never used  Substance and Sexual Activity  . Alcohol use: Yes    Comment: 8oz of wine a day  . Drug use: Never  . Sexual activity: Not on file  Other Topics Concern  . Not on file  Social History Narrative  . Not on file   Social Determinants of Health   Financial Resource Strain: Not on file  Food Insecurity: Not on file  Transportation Needs: Not on file  Physical Activity: Not on file  Stress: Not on file  Social Connections: Not on file  Intimate Partner Violence: Not on file    Past Surgical History:  Procedure Laterality Date  . ATRIAL FIBRILLATION ABLATION  10/2015  . BACK SURGERY    . CARDIOVERSION  2017  . CATARACT EXTRACTION Bilateral 2017   March and April 2017  . COLONOSCOPY  2018  . CORONARY STENT PLACEMENT  2012  . CORONARY/GRAFT ACUTE MI REVASCULARIZATION N/A 01/10/2019   Procedure: CORONARY/GRAFT ACUTE MI REVASCULARIZATION;  Surgeon: Leonie Man, MD;  Location: [facility];  Service: Cardiovascular;  Laterality: N/A;  . INGUINAL HERNIA REPAIR     over 20 years ago  . LEFT HEART CATH AND CORONARY ANGIOGRAPHY N/A 01/10/2019   Procedure: LEFT HEART CATH AND CORONARY ANGIOGRAPHY;  Surgeon: Leonie Man, MD;  Location: [facility];  Service: Cardiovascular;  Laterality: N/A;  . LUMBAR LAMINECTOMY Bilateral 04/05/2016   L2-L5   . VENTRAL HERNIA REPAIR  2018    Family History  Problem Relation Age of Onset  . Colon cancer Father   . Throat cancer Brother     Allergies  Allergen Reactions  . Naproxen Other (See Comments)    (Naprosyn *ANALGESICS - ANTI-INFLAMMATORY*) Nausea, Abdominal pain    Current Outpatient Medications on File Prior to Visit  Medication Sig Dispense Refill  . atorvastatin (LIPITOR) 80 MG tablet Take 1 tablet (80 mg total) by mouth daily at 6 PM. 30 tablet 11  . buPROPion (WELLBUTRIN XL) 300 MG 24 hr tablet Take 1 tablet (300 mg total) by mouth daily. 90 tablet 0  . carvedilol (  COREG) 3.125 MG tablet TAKE 1 TABLET(3.125 MG) BY MOUTH TWICE DAILY WITH A MEAL 120 tablet 3  . Cholecalciferol 25 MCG (1000 UT) tablet Take 1,000 Units by mouth daily.     . famotidine (PEPCID) 40 MG tablet TAKE 1 TABLET(40 MG) BY MOUTH DAILY 90 tablet 3  . fexofenadine (ALLEGRA) 180 MG tablet Take 180 mg by mouth at bedtime.     . finasteride (PROSCAR) 5 MG tablet Take 1 tablet (5 mg total) by mouth daily. 90 tablet 1  . gabapentin (NEURONTIN) 100 MG capsule Take 2-3 capsules (200-300 mg total) by mouth at bedtime. 90 capsule 3  . Multiple Vitamins-Minerals (PRESERVISION AREDS PO) Take 2 capsules by mouth in the morning and at bedtime.    . nitroGLYCERIN (NITROSTAT) 0.4 MG SL tablet Place 1 tablet (0.4 mg total) under the tongue every 5 (five) minutes as needed for chest pain. 25 tablet 3  . Pirfenidone (ESBRIET) 801 MG TABS Take 801 mg by mouth with breakfast, with lunch, and with evening meal. 90 tablet 4  . ranolazine (RANEXA) 1000 MG SR tablet Take 1 tablet (1,000 mg total) by mouth 2 (two) times daily. 180 tablet 1  . sertraline (ZOLOFT) 100 MG tablet Take 150 mg by mouth at bedtime.     . solifenacin (VESICARE) 5 MG tablet Take 5 mg by mouth daily.    . tamsulosin (FLOMAX)  0.4 MG CAPS capsule TAKE 1 CAPSULE(0.4 MG) BY MOUTH DAILY 90 capsule 0  . traZODone (DESYREL) 50 MG tablet TAKE 1/2 TO 1 TABLET(25 TO 50 MG) BY MOUTH AT BEDTIME AS NEEDED FOR SLEEP 30 tablet 3  . Turmeric 500 MG CAPS Take 1 capsule by mouth 2 (two) times daily.     . vitamin B-12 (CYANOCOBALAMIN) 1000 MCG tablet Take 1,000 mcg by mouth daily.    Alveda Reasons 20 MG TABS tablet TAKE 1 TABLET(20 MG) BY MOUTH DAILY WITH SUPPER 30 tablet 5   No current facility-administered medications on file prior to visit.    BP 105/60   Pulse 98   Temp 98.2 F (36.8 C)   Resp 18   Ht 5\' 11"  (1.803 m)   Wt 188 lb (85.3 kg)   SpO2 93%   BMI 26.22 kg/m      Objective:   Physical Exam  General Mental Status- Alert. General Appearance- Not in acute distress.   Skin General: Color- Normal Color. Moisture- Normal Moisture.  Neck Carotid Arteries- Normal color. Moisture- Normal Moisture. No carotid bruits. No JVD.  Chest and Lung Exam Auscultation: Breath Sounds:-Normal.  Cardiovascular Auscultation:Rythm- Regular. No atrial fibrillation heard  Murmurs & Other Heart Sounds:Auscultation of the heart reveals- No Murmurs.  Abdomen Inspection:-Inspeection Normal. Palpation/Percussion:Note:No mass. Palpation and Percussion of the abdomen reveal- Non Tender, Non Distended + BS, no rebound or guarding.   Neurologic Cranial Nerve exam:- CN III-XII intact(No nystagmus), symmetric smile. Strength:- 5/5 equal and symmetric strength both upper and lower extremities.      Assessment & Plan:  History of atrial fibrillation that is paroxysmal.  On auscultation today does not sound to be in atrial fib.  Rate controlled as well.  Continue Coreg and Xarelto.  History of BPH.  Recently saw urologist.  Continue Flomax and Proscar.  History of COPD and IPF.  O2 sat today 96% at rest.  I recommend continue follow-up with pulmonologist as regularly scheduled.  History of coronary artery disease.  Continue  Ranexa and atorvastatin.  Follow-up with cardiologist as regularly scheduled.  History of hypertension in the past.  Today blood pressure is on the lower side.  Coreg controlled blood pressure and pulse.  Be careful on standing up from seated position.  Get balance before ambulating.  If BP is lower might need dose adjustments on her medications that can affect blood pressure.  Get CBC and CMP today.  Follow-up in 2 months or as needed.    Mackie Pai, [name]   Time spent with patient today was 41  minutes which consisted of chart review, discussing diagnosis, work up treatment and documentation. Counseled pt on IPF. Reviewed with him info on up to date. He had life expectancy concerns.

## 2020-08-12 NOTE — Addendum Note (Signed)
Addended by: Jeronimo Greaves on: 08/12/2020 10:06 AM   Modules accepted: Orders

## 2020-08-12 NOTE — Patient Instructions (Addendum)
History of atrial fibrillation that is paroxysmal.  On auscultation today does not sound to be in atrial fib.  Rate controlled as well.  Continue Coreg and Xarelto.  History of BPH.  Recently saw urologist.  Continue Flomax and Proscar.  History of COPD and IPF.  O2 sat today 96% at rest.  I recommend continue follow-up with pulmonologist as regularly scheduled.  History of coronary artery disease.  Continue Ranexa and atorvastatin.  Follow-up with cardiologist as regularly scheduled.  History of hypertension in the past.  Today blood pressure is on the lower side.  Coreg controlled blood pressure and pulse.  Be careful on standing up from seated position.  Get balance before ambulating.  If BP is lower might need dose adjustments on her medications that can affect blood pressure.  Get CBC and CMP today.  PPSV 23 given today. Up to date on covid vaccine.  Follow-up in 2 months or as needed.

## 2020-09-02 ENCOUNTER — Other Ambulatory Visit: Payer: Self-pay

## 2020-09-02 ENCOUNTER — Encounter: Payer: Self-pay | Admitting: Cardiology

## 2020-09-02 ENCOUNTER — Ambulatory Visit: Payer: Medicare Other | Admitting: Cardiology

## 2020-09-02 VITALS — BP 82/50 | HR 104 | Ht 71.0 in | Wt 184.0 lb

## 2020-09-02 DIAGNOSIS — I10 Essential (primary) hypertension: Secondary | ICD-10-CM | POA: Diagnosis not present

## 2020-09-02 DIAGNOSIS — J432 Centrilobular emphysema: Secondary | ICD-10-CM

## 2020-09-02 DIAGNOSIS — R0609 Other forms of dyspnea: Secondary | ICD-10-CM

## 2020-09-02 DIAGNOSIS — E782 Mixed hyperlipidemia: Secondary | ICD-10-CM

## 2020-09-02 DIAGNOSIS — J84112 Idiopathic pulmonary fibrosis: Secondary | ICD-10-CM

## 2020-09-02 DIAGNOSIS — I2510 Atherosclerotic heart disease of native coronary artery without angina pectoris: Secondary | ICD-10-CM

## 2020-09-02 DIAGNOSIS — R0600 Dyspnea, unspecified: Secondary | ICD-10-CM

## 2020-09-02 NOTE — Addendum Note (Signed)
Addended by: Senaida Ores on: 09/02/2020 09:23 AM   Modules accepted: Orders

## 2020-09-02 NOTE — Patient Instructions (Signed)
Medication Instructions:  Your physician has recommended you make the following change in your medication:    STOP: Carvedilol  STOP: Flomax  *If you need a refill on your cardiac medications before your next appointment, please call your pharmacy*   Lab Work: None If you have labs (blood work) drawn today and your tests are completely normal, you will receive your results only by: [place] (if you have MyChart) OR A paper copy in the mail If you have any lab test that is abnormal or we need to change your treatment, we will call you to review the results.   Testing/Procedures: None   Follow-Up: At [facility], you and your health needs are our priority.  As part of our continuing mission to provide you with exceptional heart care, we have created designated Provider Care Teams.  These Care Teams include your primary Cardiologist (physician) and Advanced Practice Providers (APPs -  Physician Assistants and Nurse Practitioners) who all work together to provide you with the care you need, when you need it.  We recommend signing up for the patient portal called "MyChart".  Sign up information is provided on this After Visit Summary.  MyChart is used to connect with patients for Virtual Visits (Telemedicine).  Patients are able to view lab/test results, encounter notes, upcoming appointments, etc.  Non-urgent messages can be sent to your provider as well.   To learn more about what you can do with MyChart, go to [email or web address].    Your next appointment:   6 month(s)  The format for your next appointment:   In Person  Provider:   Jenne Campus, MD   Other Instructions

## 2020-09-02 NOTE — Progress Notes (Signed)
Cardiology Office Note:    Date:  09/02/2020   ID:  Robert Lang, DOB 1950-09-04, MRN [id number]  PCP:  Mackie Pai, [name]  Cardiologist:  Jenne Campus, MD    Referring MD: Mackie Pai, [place]   Chief Complaint  Patient presents with   Follow-up  I am having a lot of shortness of breath  History of Present Illness:    Robert Lang is a 70 y.o. male   with a hx of CAD s/p PCI to LAD/RCA, atrial flutter s/p ablation 2017, HLD, HTN last seen while hospitalized.   LHC at [facility] 2017 with patent stent in prox LAD, 90% occlusion in small first diagonal artery and 25% occlusion of RCA w/ good EF 60%. Atrial flutter ablation at [facility] in 2017. Stress test 10/30/18 with no perfusion defect described as low risk study.   Presented to [facility] ED 01/10/19 with chest pain, code STEMI called, transferred to [facility]. Troponin peaked at [phone]. Echo preserved LV function. Underwent cardiac cath 01/10/19 with PCI and DES to RCA. Recommended for triple therapy for 1 month then stop aspirin. 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 His diltiazem was switched to coreg while hospitalized. He was find to have some chest pain few weeks ago.  After that stress test done on 10/24/2019 showing no evidence of ischemia. He was diagnosed with idiopathic pulmonary fibrosis.  He has been following treated excellently by our pulmonary team for it.  However, he does have difficulty tolerating medication.  He takes medication right now that make him feel nauseated almost all day, on top of that he does have insomnia because of the medication.  Also presents today to my office with low blood pressure.  Overall he is doing poorly shortness of breath is the leading complaint nausea as well as lack of energy.  He also looks somewhat depressed.  He is coming across to my office with his wife.  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 M75.00)   Allergic rhinitis due to pollen 09/03/2013   J30.1)  Formatting of this note might be different from the original. J30.1)   Allergy    Anticoagulated 07/01/2016   Anxiety    Arthritis    Atrial fibrillation ([place])    Atrial flutter ([place]) 06/26/2015   CAD (coronary artery disease)    Chest pain 06/26/2015   COPD (chronic obstructive pulmonary disease) ([place])    Coronary artery disease 07/06/2018   Cardiac catheterization 2017 showing 90% small diagonal branch disease   DDD (degenerative disc disease), cervical 09/03/2013   M50.90)  Formatting of this note might be different from the original. M50.90)   Depression    Depression    Depressive disorder 09/03/2013   Dizziness 10/28/2017   Dyslipidemia, goal LDL below 70 07/06/2018   Dyspnea on exertion 10/20/2018   Esophageal reflux 09/03/2013   Essential hypertension 07/06/2018   ETOH abuse 01/11/2019   6 pack of beer per day   Falls 10/28/2017   GERD (gastroesophageal reflux disease)    H/O amiodarone therapy 07/01/2016   Heart attack ([place])    Heart palpitations 01/27/2017   History of colon polyps    Hyperlipidemia    Hypertension    IPF (  idiopathic pulmonary fibrosis) ([place])    Kidney stones    Neck pain 09/03/2013   Neuropathy 07/06/2018   Obstructive sleep apnea 08/05/2015   PLMD (periodic limb movement disorder) 11/04/2015   Polycythemia, 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 STORY: Due to alcohol/ tobacco   Pure hypercholesterolemia 09/03/2013   E78.0)  Formatting of this note might be different from the original. E78.0)   Screening for prostate cancer 09/03/2013   Smoker 01/11/2019   Smoking  07/06/2018   Status post ablation of atrial flutter 07/06/2018   2017    Past Surgical History:  Procedure Laterality Date   ATRIAL FIBRILLATION ABLATION  10/2015   BACK SURGERY     CARDIOVERSION  2017   CATARACT EXTRACTION Bilateral 2017   March and April 2017   COLONOSCOPY  2018   CORONARY STENT PLACEMENT  2012   CORONARY/GRAFT ACUTE MI REVASCULARIZATION N/A 01/10/2019   Procedure: CORONARY/GRAFT ACUTE MI REVASCULARIZATION;  Surgeon: Leonie Man, MD;  Location: [facility];  Service: Cardiovascular;  Laterality: N/A;   INGUINAL HERNIA REPAIR     over 20 years ago   LEFT HEART CATH AND CORONARY ANGIOGRAPHY N/A 01/10/2019   Procedure: LEFT HEART CATH AND CORONARY ANGIOGRAPHY;  Surgeon: Leonie Man, MD;  Location: [facility];  Service: Cardiovascular;  Laterality: N/A;   LUMBAR LAMINECTOMY Bilateral 04/05/2016   L2-L5    VENTRAL HERNIA REPAIR  2018    Current Medications: Current Meds  Medication Sig   atorvastatin (LIPITOR) 80 MG tablet Take 1 tablet (80 mg total) by mouth daily at 6 PM.   buPROPion (WELLBUTRIN XL) 300 MG 24 hr tablet Take 1 tablet (300 mg total) by mouth daily.   carvedilol (COREG) 3.125 MG tablet TAKE 1 TABLET(3.125 MG) BY MOUTH TWICE DAILY WITH A MEAL   Cholecalciferol 25 MCG (1000 UT) tablet Take 1,000 Units by mouth daily.    famotidine (PEPCID) 40 MG tablet TAKE 1 TABLET(40 MG) BY MOUTH DAILY   fexofenadine (ALLEGRA) 180 MG tablet Take 180 mg by mouth at bedtime.    finasteride (PROSCAR) 5 MG tablet Take 1 tablet (5 mg total) by mouth daily.   gabapentin (NEURONTIN) 100 MG capsule Take 2-3 capsules (200-300 mg total) by mouth at bedtime.   Multiple Vitamins-Minerals (PRESERVISION AREDS PO) Take 2 capsules by mouth in the morning and at bedtime.   nitroGLYCERIN (NITROSTAT) 0.4 MG SL tablet Place 1 tablet (0.4 mg total) under the tongue every 5 (five) minutes as needed for chest pain.   Pirfenidone (ESBRIET) 801 MG TABS Take 801 mg  by mouth with breakfast, with lunch, and with evening meal.   ranolazine (RANEXA) 1000 MG SR tablet Take 1 tablet (1,000 mg total) by mouth 2 (two) times daily.   sertraline (ZOLOFT) 100 MG tablet Take 150 mg by mouth at bedtime.    solifenacin (VESICARE) 5 MG tablet Take 5 mg by mouth daily.   tamsulosin (FLOMAX) 0.4 MG CAPS capsule TAKE 1 CAPSULE(0.4 MG) BY MOUTH DAILY   traZODone (DESYREL) 50 MG tablet TAKE 1/2 TO 1 TABLET(25 TO 50 MG) BY MOUTH AT BEDTIME AS NEEDED FOR SLEEP   Turmeric 500 MG CAPS Take 1 capsule by mouth 2 (two) times daily.    vitamin B-12 (CYANOCOBALAMIN) 1000 MCG tablet Take 1,000 mcg by mouth daily.   XARELTO 20 MG TABS tablet TAKE 1 TABLET(20 MG) BY MOUTH DAILY WITH  SUPPER     Allergies:   Naproxen   Social History   Socioeconomic History   Marital status: Married    Spouse name: Not on file   Number of children: Not on file   Years of education: Not on file   Highest education level: Not on file  Occupational History   Not on file  Tobacco Use   Smoking status: Former    Packs/day: 1.00    Years: 30.00    Pack years: 30.00    Types: Cigarettes    Quit date: 01/09/2018    Years since quitting: 2.6   Smokeless tobacco: Former  Scientific laboratory technician Use: Never used  Substance and Sexual Activity   Alcohol use: Yes    Comment: 8oz of wine a day   Drug use: Never   Sexual activity: Not on file  Other Topics Concern   Not on file  Social History Narrative   Not on file   Social Determinants of Health   Financial Resource Strain: Not on file  Food Insecurity: Not on file  Transportation Needs: Not on file  Physical Activity: Not on file  Stress: Not on file  Social Connections: Not on file     Family History: The patient's family history includes Colon cancer in his father; Throat cancer in his brother. ROS:   Please see the history of present illness.    All 14 point review of systems negative except as described per history of present  illness  EKGs/Labs/Other Studies Reviewed:      Recent Labs: 03/05/2020: NT-Pro BNP 109 08/12/2020: ALT 24; BUN 13; Creatinine, Ser 0.76; Hemoglobin 15.4; Platelets 233.0; Potassium 4.5; Sodium 133  Recent Lipid Panel    Component Value Date/Time   CHOL 128 04/03/2020 0951   TRIG 180 (H) 04/03/2020 0951   HDL 40 04/03/2020 0951   CHOLHDL 3.2 04/03/2020 0951   CHOLHDL 3.4 01/10/2019 1328   VLDL 43 (H) 01/10/2019 1328   LDLCALC 58 04/03/2020 0951   LDLDIRECT 66.0 09/06/2018 0924    Physical Exam:    VS:  BP (!) 82/50 (BP Location: Left Arm, Patient Position: Sitting, Cuff Size: Normal)   Pulse (!) 104   Ht 5\' 11"  (1.803 m)   Wt 184 lb (83.5 kg)   SpO2 93%   BMI 25.66 kg/m     Wt Readings from Last 3 Encounters:  09/02/20 184 lb (83.5 kg)  08/12/20 188 lb (85.3 kg)  07/28/20 189 lb 9.6 oz (86 kg)     GEN:  Well nourished, well developed in no acute distress HEENT: Normal NECK: No JVD; No carotid bruits LYMPHATICS: No lymphadenopathy CARDIAC: RRR, no murmurs, no rubs, no gallops RESPIRATORY:  Clear to auscultation without rales, wheezing or rhonchi  ABDOMEN: Soft, non-tender, non-distended MUSCULOSKELETAL:  No edema; No deformity  SKIN: Warm and dry LOWER EXTREMITIES: no swelling NEUROLOGIC:  Alert and oriented x 3 PSYCHIATRIC:  Normal affect   ASSESSMENT:    1. Coronary artery disease involving native coronary artery of native heart without angina pectoris   2. Primary hypertension   3. IPF (idiopathic pulmonary fibrosis) ([place])   4. Centrilobular emphysema ([place])   5. Mixed hyperlipidemia   6. Dyspnea on exertion    PLAN:    In order of problems listed above:  Coronary disease stable from that point review denies having any chest pain tightness squeezing pressure burning chest, he is on appropriate medication which I will continue. Dyslipidemia, I did review  his K PN from January 2022 his LDL of 58 HDL 40 this is on high intense statin 4 of Lipitor 80 which  I will continue. Idiopathic pulmonary fibrosis: That being followed by antimedicine team as well as pulmonary team. Elijio Miles exertion multifactorial but looks like idiopathic pulmonary fibrosis play significant role here.  On top of that he does have some component of COPD.  Cardiac work-up done previously was negative, therefore, I think all his symptomatology is related to lung issues. Blood pressure being low.  I asked him to stop his carvedilol.  Also talked to him about Flomax which could cause hypotension as well.  And I told him if have no difficulty urinating he may consider stopping this medication as well with understanding if you start having urinary retention he started back.  My goal is to make him simply feel better.  His blood pressure today is 90 systolic.  I also encouraged him to drink plenty of fluids   Medication Adjustments/Labs and Tests Ordered: Current medicines are reviewed at length with the patient today.  Concerns regarding medicines are outlined above.  No orders of the defined types were placed in this encounter.  Medication changes: No orders of the defined types were placed in this encounter.   Signed, Park Liter, MD, [facility] 09/02/2020 9:16 AM    [place]

## 2020-09-03 ENCOUNTER — Telehealth: Payer: Self-pay | Admitting: Pharmacist

## 2020-09-03 ENCOUNTER — Telehealth: Payer: Self-pay | Admitting: Internal Medicine

## 2020-09-03 DIAGNOSIS — G4733 Obstructive sleep apnea (adult) (pediatric): Secondary | ICD-10-CM

## 2020-09-03 NOTE — Telephone Encounter (Signed)
Called and spoke with pt's wife [name] who stated they never heard about the results from the [place] that was done. Pt had HST done 06/10/20.  Dr. Annamaria Boots, please advise on this as if any treatment needs to be done, they want to try to get started on this ASAP.

## 2020-09-03 NOTE — Telephone Encounter (Signed)
Ok to use [employer]. Just need to be sure we have a way- card or mobile- to get download information. Thanks

## 2020-09-03 NOTE — Telephone Encounter (Signed)
Home sleep test did show obstructive sleep apnea averaging 31 apneas/ hour, with low blood oxygen levels.  Please order new DME, new CPAP auto 5-20, mask of choice, humidifier, supplies, AirView/ card.   Please make sure he has return ov with me in 31-90 days after getting machine. 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

## 2020-09-03 NOTE — Telephone Encounter (Signed)
Order has been placed. Nothing further needed. 

## 2020-09-03 NOTE — Telephone Encounter (Signed)
Called and spoke with pt and spouse [name] letting them know the results of pt's HST and they verbalized understanding and are okay with pt starting on CPAP therapy.  Dr. Annamaria Boots, please advise if you are okay with [place] specifying Luna machine for pt since pt has been waiting since March?

## 2020-09-03 NOTE — Telephone Encounter (Signed)
Pts wife stated that he did the HST back in March but they have never been notified of the results and their appt had to be rescheduled to 08/01 due to a change in Dr. Bertrum Sol schedule and they wanted to know if they can be informed prior. Pls regard; [phone]

## 2020-09-03 NOTE — Telephone Encounter (Signed)
Received call from patient's wife, Robert Lang, has had nausea which started on 09/01/20. He feels slightly better today than yesterday and the day prior. They are unsure if it's the Esbriet but were seeking advise on managing. No vomiting or diarrhea.  States their daughter tested positive for COVID on Sunday and they interacted with her the week prior. She plans to pick up [facility] home tests to have [name] tested prior to this appointment.  He currently has the 801mg  tablet on hand to be unable to reduce dose by 267mg  increments. I recommended that he reduce dose to 801 mg twice daily until f/u visit with Dr. Chase Caller, but he states he is able to take three times daily since he has been feeling better. Recommended making sure he continues to stay adequately hydrated and incorporates 3 meals into his daily dosing regiment to help (he currently eats 6 small meals).  Routing to Dr. Chase Caller as Shon Millet, PharmD, MPH Clinical Pharmacist (Rheumatology and Pulmonology)

## 2020-09-08 NOTE — Telephone Encounter (Signed)
Will address tomorrow

## 2020-09-09 ENCOUNTER — Other Ambulatory Visit: Payer: Self-pay

## 2020-09-09 ENCOUNTER — Encounter: Payer: Self-pay | Admitting: Internal Medicine

## 2020-09-09 ENCOUNTER — Ambulatory Visit: Payer: Medicare Other | Admitting: Internal Medicine

## 2020-09-09 VITALS — BP 100/60 | HR 94 | Ht 71.0 in | Wt 184.6 lb

## 2020-09-09 DIAGNOSIS — R112 Nausea with vomiting, unspecified: Secondary | ICD-10-CM | POA: Diagnosis not present

## 2020-09-09 DIAGNOSIS — R634 Abnormal weight loss: Secondary | ICD-10-CM | POA: Diagnosis not present

## 2020-09-09 DIAGNOSIS — Z79899 Other long term (current) drug therapy: Secondary | ICD-10-CM

## 2020-09-09 DIAGNOSIS — Z7409 Other reduced mobility: Secondary | ICD-10-CM

## 2020-09-09 DIAGNOSIS — J84112 Idiopathic pulmonary fibrosis: Secondary | ICD-10-CM

## 2020-09-09 DIAGNOSIS — T50905A Adverse effect of unspecified drugs, medicaments and biological substances, initial encounter: Secondary | ICD-10-CM

## 2020-09-09 LAB — HEPATIC FUNCTION PANEL
ALT: 30 U/L (ref 0–53)
AST: 19 U/L (ref 0–37)
Albumin: 3.9 g/dL (ref 3.5–5.2)
Alkaline Phosphatase: 71 U/L (ref 39–117)
Bilirubin, Direct: 0.2 mg/dL (ref 0.0–0.3)
Total Bilirubin: 0.5 mg/dL (ref 0.2–1.2)
Total Protein: 7.6 g/dL (ref 6.0–8.3)

## 2020-09-09 NOTE — Patient Instructions (Addendum)
ICD-10-CM   1. IPF (idiopathic pulmonary fibrosis) ([place])  J84.112     2. Medication management  Z79.899     3. Drug-induced weight loss  R63.4    T50.905A     4. Drug-induced nausea and vomiting  R11.2    T50.905A     5. Immobility  Z74.09      He having significant amount of nausea and 10 pound weight loss since starting pirfenidone after Easter 2022  In addition he might be dealing with progressive disease  However the risk of you dying in the next few weeks to few months to several months is very low as things stand right now  Combination of musculoskeletal and pulmonary fibrosis is causing immobility and I think you can benefit from a motorized scooter or wheelchair  In addition you have untreated sleep apnea -once you get your CPAP this could improve your quality of life  Please keep in mind in the next few to several years secondary potential new treatments for IPF that have much better side effect profile  Plan -Check liver function test today - Reduce pirfenidone to 801 mg twice daily  -Take it with food, drink a lot of water and also try ginger capsules  - if this des not work will discuss ofev  - trials is a care option to discuss in future -Do qualifying walk test in our office -Await starting CPAP end of 2022 once supply chain is restored -Repeat spirometry and DLCO test in 2 months -At some point we can consider clinical trials as a care option   Follow-up - 30-minute visit Dr. Chase Caller in 2 months -but after spirometry and DLCO  -Symptom score and walking desaturation test at that time
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
## 2020-09-10 ENCOUNTER — Telehealth: Payer: Self-pay | Admitting: Internal Medicine

## 2020-09-10 NOTE — Telephone Encounter (Signed)
Called and spoke with Mardene Celeste with [employer]. Patient is not wanting to pay out of pocket amount to [employer] for electric scooter. Patient has agreed to manual wheelchair, with back cushion, swing away legs, and wheel locks.  Mardene Celeste stated wheelchair order needs specific wheelchair needs and narrative in order for why Patient needs wheelchair vs walker.   Message routed to Dr. Annamaria Boots to advise

## 2020-09-11 ENCOUNTER — Ambulatory Visit: Payer: Medicare Other | Admitting: Internal Medicine

## 2020-09-11 NOTE — Telephone Encounter (Signed)
Message routed to Dr. Chase Caller to advise on wheelchair.

## 2020-09-11 NOTE — Telephone Encounter (Signed)
This order was from Dr Chase Caller at his 6/21 ov - please redirect issue to him. Thanks

## 2020-09-12 NOTE — Telephone Encounter (Signed)
Dr. Chase Caller, We have a dot phrase for a WC order.  I have pended an order for you, all you have to do is fill in the asterisks and sign the order.  Thank you.

## 2020-09-12 NOTE — Telephone Encounter (Signed)
  Not done  a wheel chair order. Does [employer] have a template?

## 2020-09-16 ENCOUNTER — Telehealth: Payer: Self-pay | Admitting: Internal Medicine

## 2020-09-16 DIAGNOSIS — G629 Polyneuropathy, unspecified: Secondary | ICD-10-CM

## 2020-09-16 DIAGNOSIS — J84112 Idiopathic pulmonary fibrosis: Secondary | ICD-10-CM

## 2020-09-16 DIAGNOSIS — M1990 Unspecified osteoarthritis, unspecified site: Secondary | ICD-10-CM

## 2020-09-16 DIAGNOSIS — M5030 Other cervical disc degeneration, unspecified cervical region: Secondary | ICD-10-CM

## 2020-09-16 NOTE — Telephone Encounter (Signed)
Robert Lang wife checking on order for manual wheelchair. Robert Lang phone number is [phone].

## 2020-09-16 NOTE — Telephone Encounter (Signed)
I called and spoke with patient wife [name], who is on [place], regarding wheelchair order. I checked the chart and looks like the order was confirmed by danielle at [employer] so they should be working on it and will reach out next. [name] verbalized understanding, nothing further needed

## 2020-09-16 NOTE — Telephone Encounter (Signed)
Spoke with Melissa from [facility] who is stating pt does not want to pay for electric scooter so a manual WC order needs to be be placed. Melissa also states that OV note from when chair was ordered needs to be amended to include the WC  dot phrase. Dr. Chase Caller please advise.

## 2020-09-17 ENCOUNTER — Telehealth: Payer: Self-pay | Admitting: Internal Medicine

## 2020-09-17 NOTE — Telephone Encounter (Signed)
Called and spoke with patient's wife Robert Lang. She was calling to check on the status of the order for the manual wheelchair order. I advised her that a message had been sent to MR yesterday from [facility]. She verbalized understanding.   Will close this encounter since this is a duplicate message.

## 2020-09-22 ENCOUNTER — Other Ambulatory Visit: Payer: Self-pay | Admitting: Medical

## 2020-09-26 DIAGNOSIS — F331 Major depressive disorder, recurrent, moderate: Secondary | ICD-10-CM | POA: Diagnosis not present

## 2020-09-26 DIAGNOSIS — F4322 Adjustment disorder with anxiety: Secondary | ICD-10-CM | POA: Diagnosis not present

## 2020-09-26 NOTE — Telephone Encounter (Signed)
Order for a wheelchair has been placed. Nothing further needed.

## 2020-09-26 NOTE — Telephone Encounter (Signed)
I do not know where to do the order and what is the dotphrase. Someone will have to show me.  [place]akns  MR

## 2020-10-10 ENCOUNTER — Other Ambulatory Visit: Payer: Self-pay | Admitting: Pharmacist

## 2020-10-10 DIAGNOSIS — Z5181 Encounter for therapeutic drug level monitoring: Secondary | ICD-10-CM

## 2020-10-10 DIAGNOSIS — J84112 Idiopathic pulmonary fibrosis: Secondary | ICD-10-CM

## 2020-10-10 NOTE — Progress Notes (Signed)
Patient due for hepatic function monitoring. Last LFTs were wnl on 09/09/20.  He is taking '801mg'$  twice daily and tolerating better than '801mg'$  three times daily. She states his appetite has increased. He continues to have nausea but improved from before.  Weight has been steady (no further loss) though he has not weighed himself at home.  Steele Sizer has appointment on Monday, 10/13/20 with Dr. Harvie Heck and may already be having CMP completed. I've sent staff message to Dr. Harvie Heck to request CMP be drawn. I've placed standing order for now.  Knox Saliva, PharmD, MPH, BCPS Clinical Pharmacist (Rheumatology and Pulmonology)

## 2020-10-13 ENCOUNTER — Ambulatory Visit (INDEPENDENT_AMBULATORY_CARE_PROVIDER_SITE_OTHER): Payer: Medicare Other | Admitting: Medical

## 2020-10-13 ENCOUNTER — Encounter: Payer: Self-pay | Admitting: Medical

## 2020-10-13 ENCOUNTER — Other Ambulatory Visit: Payer: Self-pay

## 2020-10-13 VITALS — BP 119/71 | HR 95 | Temp 98.2°F | Resp 20 | Ht 71.0 in | Wt 185.0 lb

## 2020-10-13 DIAGNOSIS — E782 Mixed hyperlipidemia: Secondary | ICD-10-CM

## 2020-10-13 DIAGNOSIS — I10 Essential (primary) hypertension: Secondary | ICD-10-CM | POA: Diagnosis not present

## 2020-10-13 DIAGNOSIS — I4891 Unspecified atrial fibrillation: Secondary | ICD-10-CM

## 2020-10-13 DIAGNOSIS — G4700 Insomnia, unspecified: Secondary | ICD-10-CM

## 2020-10-13 DIAGNOSIS — G2581 Restless legs syndrome: Secondary | ICD-10-CM

## 2020-10-13 DIAGNOSIS — F32A Depression, unspecified: Secondary | ICD-10-CM

## 2020-10-13 LAB — COMPREHENSIVE METABOLIC PANEL
ALT: 22 U/L (ref 0–53)
AST: 14 U/L (ref 0–37)
Albumin: 4.1 g/dL (ref 3.5–5.2)
Alkaline Phosphatase: 62 U/L (ref 39–117)
BUN: 14 mg/dL (ref 6–23)
CO2: 26 mEq/L (ref 19–32)
Calcium: 9.4 mg/dL (ref 8.4–10.5)
Chloride: 100 mEq/L (ref 96–112)
Creatinine, Ser: 0.85 mg/dL (ref 0.40–1.50)
GFR: 88.01 mL/min (ref >60.00)
Glucose, Bld: 97 mg/dL (ref 70–99)
Potassium: 4.4 mEq/L (ref 3.5–5.1)
Sodium: 134 mEq/L — ABNORMAL LOW (ref 135–145)
Total Bilirubin: 0.6 mg/dL (ref 0.2–1.2)
Total Protein: 6.9 g/dL (ref 6.0–8.3)

## 2020-10-13 LAB — LIPID PANEL
Cholesterol: 138 mg/dL (ref 0–200)
HDL: 28.6 mg/dL — ABNORMAL LOW (ref >39.00)
NonHDL: 109.08
Total CHOL/HDL Ratio: 5
Triglycerides: 380 mg/dL — ABNORMAL HIGH (ref 0.0–149.0)
VLDL: 76 mg/dL — ABNORMAL HIGH (ref 0.0–40.0)

## 2020-10-13 LAB — LDL CHOLESTEROL, DIRECT: Direct LDL: 66 mg/dL

## 2020-10-13 NOTE — Patient Instructions (Addendum)
History of htn. Bp much better recently and cardiologist discontinued  coreg per chart review.  For high cholesterol will get lipid panel and cmp.  Hx of atrial fibrillation. Continue xarelto.  For pulmonary fibrosis continue esbriet.  For depression, I have listed that you have sertraline and wellbutrin on your list. Please check to see if taking both. Let me know by my chart.  For insomnia verify taking trazadone 50 mg at night. Consider add on melatonin 10 mg over the counter.  For restless leg syndrome want to verify current dose you are using as can titrate up past 300 mg if needed.   Please have you wife up date me by my chart on above med questions.  Follow up in 3 months or as needed.

## 2020-10-13 NOTE — Progress Notes (Signed)
Subjective:    Patient ID: Robert Lang, male    DOB: [birth year], 70 y.o.   MRN: [id number]  HPI  Pt in for follow up.  Pt has htn and hyperlipidemia.    Pt bp is well controlled.  Cardiologist stopped carvedilol and flomax(due to potential side effect)  Hx of pulmonary fibrosis. Seeing pulmonlogist. His 02 sat are over 90% when he checks. Pt is on esbriet   Pt admits some depression. He sertaline 100 mg at night. Also on chart review he is on wellbutrin.  He has some insomnia. He is on trazadone per chart.  Hx of sleep apnea. Pt has had sleep study. Machine is on back order.   He feel like he has restless legs. He feels like current dose not working.      Review of Systems  Constitutional:  Negative for chills, fatigue and fever.  Respiratory:  Positive for shortness of breath. Negative for cough and wheezing.        Daily dyspnea on exertion with pulmonary condition.  Cardiovascular:  Negative for chest pain and palpitations.       No cardiac associated type signs or symptoms.  Gastrointestinal:  Negative for abdominal pain, anal bleeding, blood in stool, diarrhea and nausea.  Endocrine: Negative for polydipsia and polyuria.  Genitourinary:  Negative for dysuria, flank pain, frequency and hematuria.  Musculoskeletal:  Negative for back pain, myalgias, neck pain and neck stiffness.  Skin:  Negative for rash.  Neurological:  Negative for dizziness, speech difficulty, weakness, numbness and headaches.  Hematological:  Negative for adenopathy. Does not bruise/bleed easily.  Psychiatric/Behavioral:  Negative for behavioral problems, confusion and dysphoric mood. The patient is not nervous/anxious.     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 M75.00)   Allergic  rhinitis due to pollen 09/03/2013   J30.1)  Formatting of this note might be different from the original. J30.1)   Allergy    Anticoagulated 07/01/2016   Anxiety    Arthritis    Atrial fibrillation ([place])    Atrial flutter ([place]) 06/26/2015   CAD (coronary artery disease)    Chest pain 06/26/2015   COPD (chronic obstructive pulmonary disease) ([place])    Coronary artery disease 07/06/2018   Cardiac catheterization 2017 showing 90% small diagonal branch disease   DDD (degenerative disc disease), cervical 09/03/2013   M50.90)  Formatting of this note might be different from the original. M50.90)   Depression    Depression    Depressive disorder 09/03/2013   Dizziness 10/28/2017   Dyslipidemia, goal LDL below 70 07/06/2018   Dyspnea on exertion 10/20/2018   Esophageal reflux 09/03/2013   Essential hypertension 07/06/2018   ETOH abuse 01/11/2019   6 pack of beer per day   Falls 10/28/2017   GERD (gastroesophageal reflux disease)    H/O amiodarone therapy 07/01/2016   Heart attack ([place])    Heart palpitations 01/27/2017   History of colon polyps    Hyperlipidemia    Hypertension    IPF (idiopathic pulmonary fibrosis) ([place])    Kidney stones    Neck pain 09/03/2013   Neuropathy 07/06/2018   Obstructive sleep apnea 08/05/2015   PLMD (periodic limb movement disorder) 11/04/2015   Polycythemia, 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 STORY: Due to alcohol/ tobacco   Pure hypercholesterolemia 09/03/2013   E78.0)  Formatting of this note might be different from the original. E78.0)   Screening for prostate cancer 09/03/2013   Smoker 01/11/2019   Smoking 07/06/2018   Status post ablation of atrial flutter 07/06/2018   2017     Social History   Socioeconomic History   Marital status: Married    Spouse name: Not on file   Number of children: Not on file   Years of education: Not on file   Highest  education level: Not on file  Occupational History   Not on file  Tobacco Use   Smoking status: Former    Packs/day: 1.00    Years: 30.00    Pack years: 30.00    Types: Cigarettes    Quit date: 01/09/2018    Years since quitting: 2.7   Smokeless tobacco: Former  Scientific laboratory technician Use: Never used  Substance and Sexual Activity   Alcohol use: Yes    Comment: 8oz of wine a day   Drug use: Never   Sexual activity: Not on file  Other Topics Concern   Not on file  Social History Narrative   Not on file   Social Determinants of Health   Financial Resource Strain: Not on file  Food Insecurity: Not on file  Transportation Needs: Not on file  Physical Activity: Not on file  Stress: Not on file  Social Connections: Not on file  Intimate Partner Violence: Not on file    Past Surgical History:  Procedure Laterality Date   [place]  10/2015   BACK SURGERY     CARDIOVERSION  2017   CATARACT EXTRACTION Bilateral 2017   March and April 2017   COLONOSCOPY  2018   CORONARY STENT PLACEMENT  2012   CORONARY/GRAFT ACUTE MI REVASCULARIZATION N/A 01/10/2019   Procedure: CORONARY/GRAFT ACUTE MI REVASCULARIZATION;  Surgeon: Leonie Man, MD;  Location: [facility];  Service: Cardiovascular;  Laterality: N/A;   INGUINAL HERNIA REPAIR     over 20 years ago   LEFT HEART CATH AND CORONARY ANGIOGRAPHY N/A 01/10/2019   Procedure: LEFT HEART CATH AND CORONARY ANGIOGRAPHY;  Surgeon: Leonie Man, MD;  Location: [facility];  Service: Cardiovascular;  Laterality: N/A;   LUMBAR LAMINECTOMY Bilateral 04/05/2016   L2-L5    VENTRAL HERNIA REPAIR  2018    Family History  Problem Relation Age of Onset   Colon cancer Father    Throat cancer Brother     Allergies  Allergen Reactions   Naproxen Other (See Comments)    (Naprosyn *ANALGESICS - ANTI-INFLAMMATORY*) Nausea, Abdominal pain    Current Outpatient Medications on File Prior to Visit   Medication Sig Dispense Refill   atorvastatin (LIPITOR) 80 MG tablet Take 1 tablet (80 mg total) by mouth daily at 6 PM. 30 tablet 11   buPROPion (WELLBUTRIN XL) 300 MG 24 hr tablet Take 1 tablet (300 mg total) by mouth daily. 90 tablet 0   Cholecalciferol 25 MCG (1000 UT) tablet Take 1,000 Units by mouth daily.      docusate sodium (COLACE) 50 MG capsule Take 50 mg by mouth at bedtime.     famotidine (PEPCID) 40 MG tablet TAKE 1 TABLET(40 MG) BY MOUTH DAILY 90 tablet 3   fexofenadine (ALLEGRA) 180 MG tablet Take 180 mg by mouth at bedtime.      finasteride (PROSCAR) 5  MG tablet Take 1 tablet (5 mg total) by mouth daily. 90 tablet 1   gabapentin (NEURONTIN) 100 MG capsule Take 2-3 capsules (200-300 mg total) by mouth at bedtime. 90 capsule 3   Multiple Vitamins-Minerals (PRESERVISION AREDS PO) Take 2 capsules by mouth in the morning and at bedtime.     nitroGLYCERIN (NITROSTAT) 0.4 MG SL tablet Place 1 tablet (0.4 mg total) under the tongue every 5 (five) minutes as needed for chest pain. 25 tablet 3   Pirfenidone (ESBRIET) 801 MG TABS Take 801 mg by mouth with breakfast, with lunch, and with evening meal. (Patient taking differently: Take 801 mg by mouth in the morning and at bedtime.) 90 tablet 4   Polyethylene Glycol 3350 (MIRALAX PO) Take by mouth at bedtime.     ranolazine (RANEXA) 1000 MG SR tablet Take 1 tablet (1,000 mg total) by mouth 2 (two) times daily. 180 tablet 1   sertraline (ZOLOFT) 100 MG tablet Take 150 mg by mouth at bedtime.      solifenacin (VESICARE) 5 MG tablet Take 5 mg by mouth daily.     traZODone (DESYREL) 50 MG tablet TAKE 1/2 TO 1 TABLET(25 TO 50 MG) BY MOUTH AT BEDTIME AS NEEDED FOR SLEEP 30 tablet 3   Turmeric 500 MG CAPS Take 1 capsule by mouth 2 (two) times daily.      vitamin B-12 (CYANOCOBALAMIN) 1000 MCG tablet Take 1,000 mcg by mouth daily.     XARELTO 20 MG TABS tablet TAKE 1 TABLET(20 MG) BY MOUTH DAILY WITH SUPPER 30 tablet 5   No current  facility-administered medications on file prior to visit.    BP 119/71   Pulse 95   Temp 98.2 F (36.8 C)   Resp 20   Ht '5\' 11"'$  (1.803 m)   Wt 185 lb (83.9 kg)   SpO2 95%   BMI 25.80 kg/m        Objective:   Physical Exam  General Mental Status- Alert. General Appearance- Not in acute distress.   Skin General: Color- Normal Color. Moisture- Normal Moisture.  Neck Carotid Arteries- Normal color. Moisture- Normal Moisture. No carotid bruits. No JVD.  Chest and Lung Exam Auscultation: Breath Sounds:-Normal.  Cardiovascular Auscultation:Rythm- Regular. Murmurs & Other Heart Sounds:Auscultation of the heart reveals- No Murmurs.  Abdomen Inspection:-Inspeection Normal. Palpation/Percussion:Note:No mass. Palpation and Percussion of the abdomen reveal- Non Tender, Non Distended + BS, no rebound or guarding.   Neurologic Cranial Nerve exam:- CN III-XII intact(No nystagmus), symmetric smile. Strength:- 5/5 equal and symmetric strength both upper and lower extremities.   Lower ext- symmetric calfs. No pedal edema.     Assessment & Plan:   History of htn. Bp much better recently and cardiologist discontinued  coreg per chart review.  For high cholesterol will get lipid panel and cmp.  Hx of atrial fibrillation. Continue xarelto.  For pulmonary fibrosis continue esbriet.  For depression, I have listed that you have sertraline and wellbutrin on your list. Please check to see if taking both. Let me know by my chart.  For insomnia verify taking trazadone 50 mg at night. Consider add on melatonin 10 mg over the counter.  For restless leg syndrome want to verify current dose you are using as can titrate up past 300 mg if needed.   Please have you wife up date me by my chart on above med questions.  Follow up in 3 months or as needed.  Mackie Pai, [name]

## 2020-10-14 DIAGNOSIS — G4733 Obstructive sleep apnea (adult) (pediatric): Secondary | ICD-10-CM | POA: Diagnosis not present

## 2020-10-14 NOTE — Progress Notes (Addendum)
Labs drawn on 10/13/20 with Dr. Harvie Heck showed LFTs wnl. Will re-check 1 in month.  He will continue Esbriet '801mg'$  twice daily.  F/ with Dr. Chase Caller is on 11/18/20  Knox Saliva, PharmD, MPH, BCPS Clinical Pharmacist (Rheumatology and Pulmonology)

## 2020-10-15 ENCOUNTER — Encounter: Payer: Self-pay | Admitting: Medical

## 2020-10-16 ENCOUNTER — Telehealth: Payer: Self-pay | Admitting: Internal Medicine

## 2020-10-16 NOTE — Telephone Encounter (Signed)
He needs a f/u with me 31-90 days after getting his machine- insurance regulation. Ok to use a held spot.

## 2020-10-16 NOTE — Telephone Encounter (Signed)
Called and spoke with patient's wife [name] per DPR to get patient scheduled for OV after getting his CPAP today. Patient is now scheduled with Dr. Annamaria Boots on 11/20/2020. Nothing further needed at this time.

## 2020-10-16 NOTE — Telephone Encounter (Signed)
I called and spoke with pts wife and she stated that the pt got his cpap today and they have this set up and he will start using this tonight.  His wife stated that they told her he would need a follow up with CY in at least 31 days.  I have check the schedule for CY and there is no openings except for the held spots and the pts wife stated that they need a morning appt.  CY please advise. Thanks

## 2020-10-20 ENCOUNTER — Ambulatory Visit: Payer: Medicare Other | Admitting: Internal Medicine

## 2020-10-20 ENCOUNTER — Other Ambulatory Visit: Payer: Self-pay | Admitting: Medical

## 2020-10-20 ENCOUNTER — Other Ambulatory Visit: Payer: Self-pay | Admitting: Cardiology

## 2020-10-20 NOTE — Telephone Encounter (Signed)
Prescription refill request for Xarelto received.  Indication:atrial fib Last office visit:6/22 Weight:83.9 kg Age:70 Scr:0.8 CrCl:101.96 ml/min  Prescription refilled

## 2020-10-22 DIAGNOSIS — F4322 Adjustment disorder with anxiety: Secondary | ICD-10-CM | POA: Diagnosis not present

## 2020-10-22 DIAGNOSIS — F331 Major depressive disorder, recurrent, moderate: Secondary | ICD-10-CM | POA: Diagnosis not present

## 2020-10-23 ENCOUNTER — Telehealth: Payer: Self-pay | Admitting: Internal Medicine

## 2020-10-23 MED ORDER — CEPHALEXIN 500 MG PO CAPS: 500.0000 mg | ORAL_CAPSULE | Freq: Three times a day (TID) | ORAL | 0 refills | Status: AC

## 2020-10-23 MED ORDER — PREDNISONE 10 MG PO TABS: ORAL_TABLET | ORAL | 0 refills | Status: AC

## 2020-10-23 NOTE — Telephone Encounter (Signed)
Called and spoke with patient's wife. She stated that she was sick with bronchitis last week and required abx from her PCP. Now the patient is having the same symptoms that she had. He has a productive cough with yellow phlegm. He is also blowing yellow mucus from his nose. She denied any increase in SOB but did state he has been more fatigued lately. He has noticed more nausea but she believes this is coming from the [address]. Denied any fevers or body aches.   She gave him an at home covid test this morning which came back negative.   She wants to know if MR would be willing to send in an abx for him.   Pharmacy is [facility] in [place] on [address].   MR, can you please advise? Thanks!

## 2020-10-23 NOTE — Telephone Encounter (Signed)
Should get a covid pcr test and if positive let us know  Meanwhile take  Please take prednisone 40 mg x1 day, then 30 mg x1 day, then 20 mg x1 day, then 10 mg x1 day, and then 5 mg x1 day and stop  cephalexin '500mg'$  three times daily x  5 days   Allergies  Allergen Reactions   Naproxen Other (See Comments)    (Naprosyn *ANALGESICS - ANTI-INFLAMMATORY*) Nausea, Abdominal pain

## 2020-10-23 NOTE — Telephone Encounter (Signed)
Called and spoke with patient's wife. She verbalized understanding of the RXs. I have sent the RXs to [facility].   Nothing further needed at time of call.

## 2020-10-27 IMAGING — CR DG CHEST 2V
3 series · 3 of 3 positions shown · non-contrast
Comparison: None.

CLINICAL DATA: Chest pain

EXAM:
CHEST - 2 VIEW

[w chest pa]
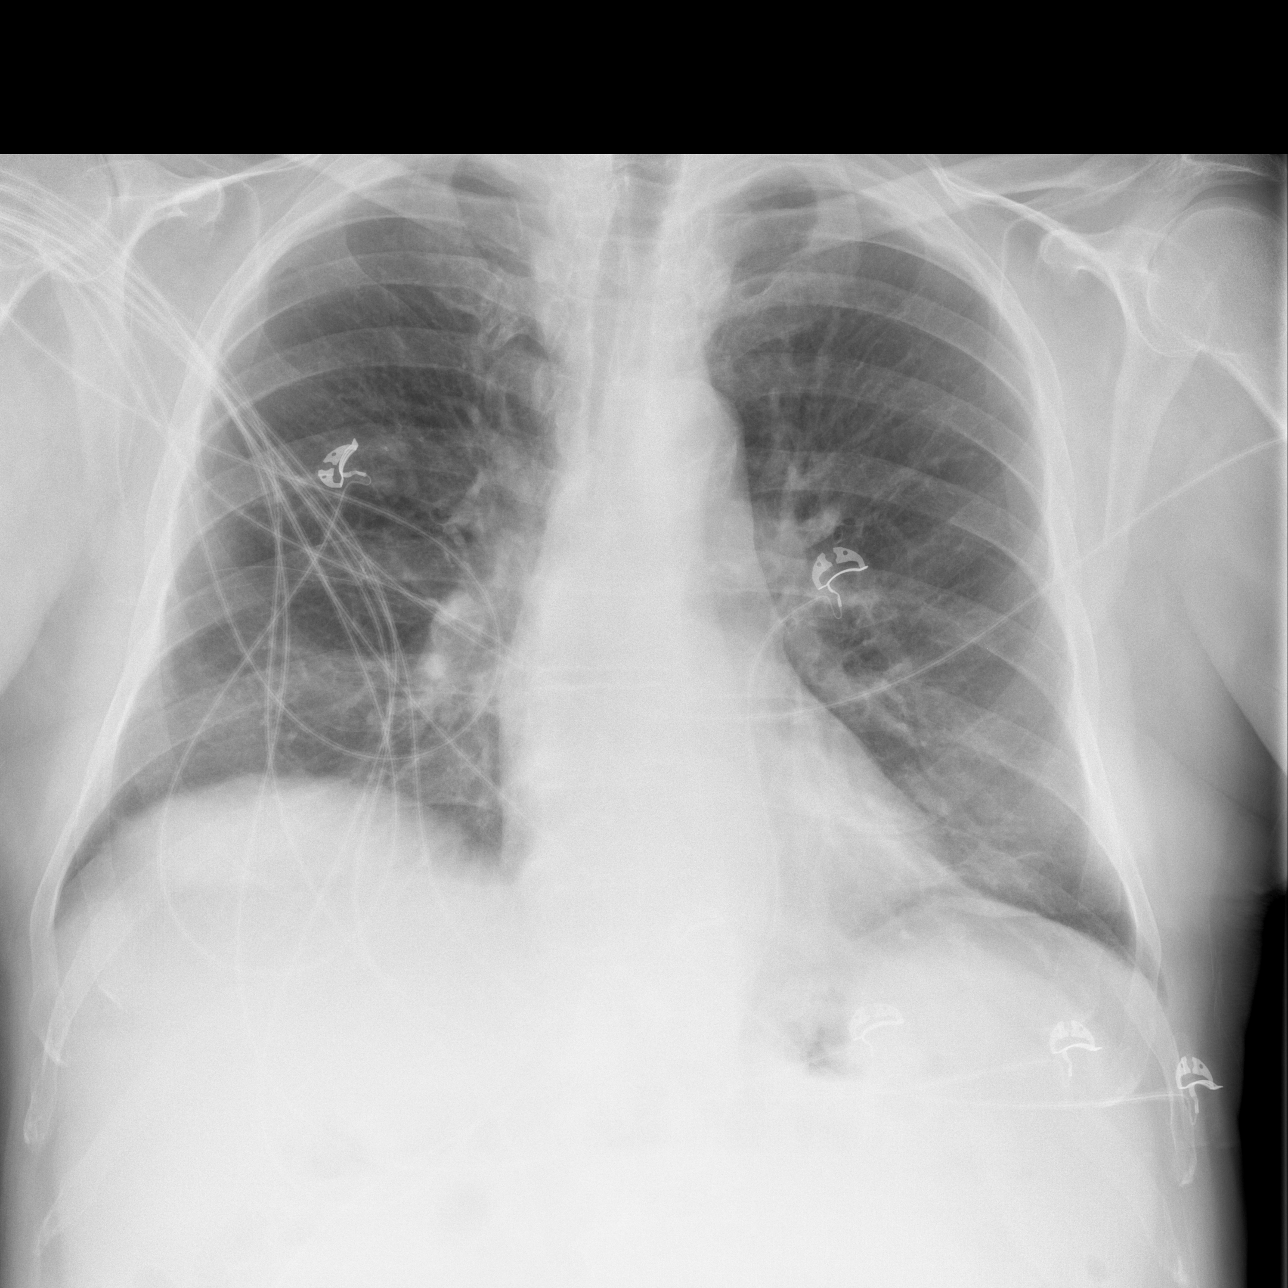

[w chest lat (1 of 2)]
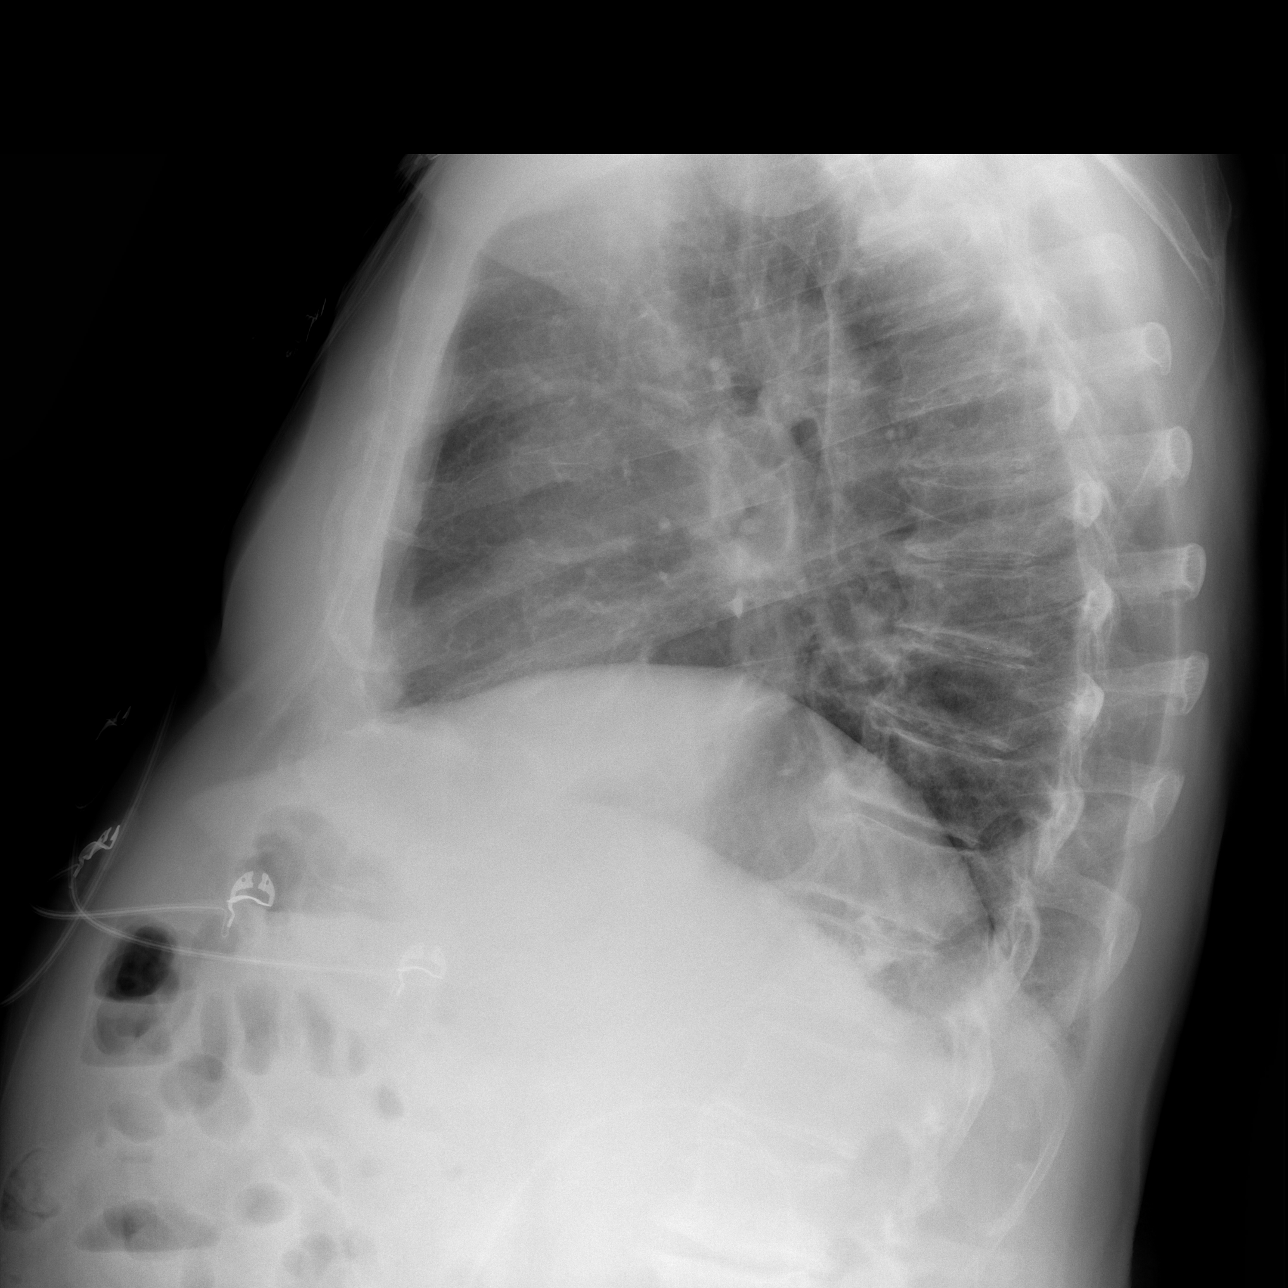

[w chest lat (2 of 2)]
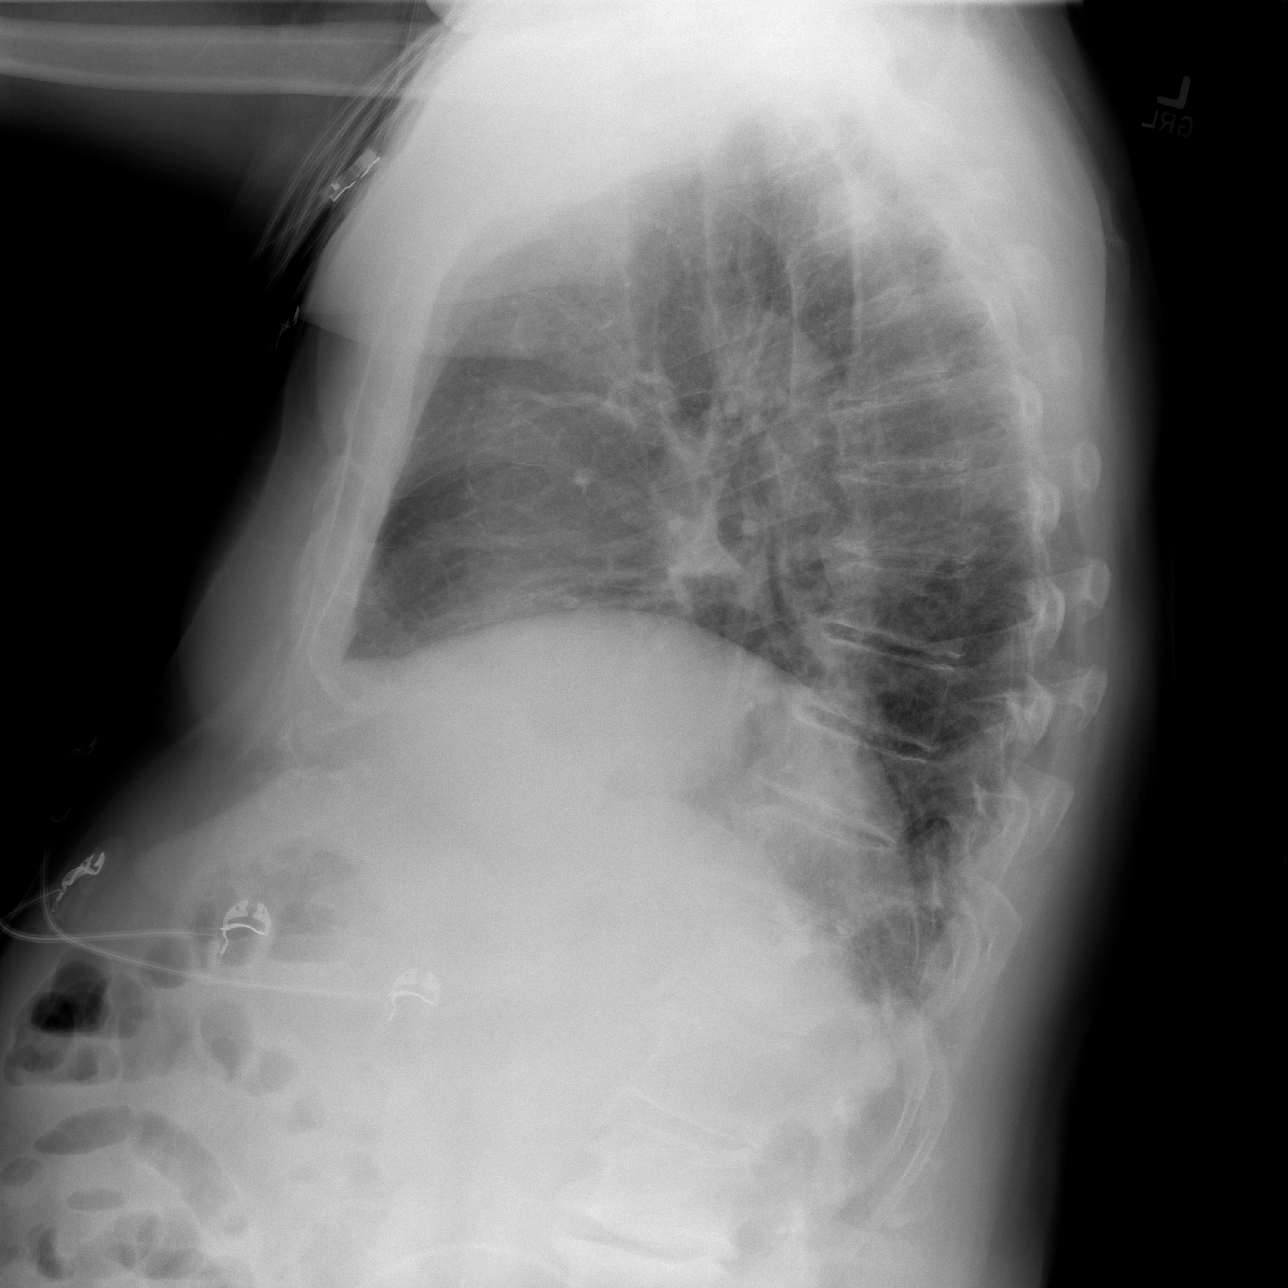

[3 of 3 positions shown; findings below may reference images not displayed]

FINDINGS: The heart size and mediastinal contours are within normal limits.
Aortic knob calcifications are seen. A both lungs are clear. No
acute osseous abnormality.
IMPRESSION: No active cardiopulmonary disease.

## 2020-11-05 ENCOUNTER — Telehealth: Payer: Self-pay | Admitting: Internal Medicine

## 2020-11-05 NOTE — Telephone Encounter (Signed)
I called [facility] to let them know this is done through the homecare company but they really didn't understand it   I have spoke to Alphonse Guild they have this they are working on this and have spoke to the wife and they will take care of this.

## 2020-11-05 NOTE — Telephone Encounter (Signed)
Robert Lang stated that they are needing a PA for the CPAP order that was placed she stated that you will have to call this number to get that process going which is; [phone]; prompt #6; open until 5 p.m. M-F; she said you can leave a message after 5 on that number; Care Management Dept.  Pls regard; [phone] (Robert Lang number)

## 2020-11-08 ENCOUNTER — Other Ambulatory Visit: Payer: Self-pay | Admitting: Cardiology

## 2020-11-10 ENCOUNTER — Telehealth: Payer: Self-pay | Admitting: Medical

## 2020-11-10 ENCOUNTER — Encounter: Payer: Self-pay | Admitting: Medical

## 2020-11-10 MED ORDER — GABAPENTIN 100 MG PO CAPS: ORAL_CAPSULE | ORAL | 3 refills | Status: DC

## 2020-11-10 NOTE — Telephone Encounter (Signed)
Rx high dose gabapentin sent to pt pharmacy.

## 2020-11-14 DIAGNOSIS — G4733 Obstructive sleep apnea (adult) (pediatric): Secondary | ICD-10-CM | POA: Diagnosis not present

## 2020-11-18 ENCOUNTER — Ambulatory Visit: Payer: Medicare Other | Admitting: Internal Medicine

## 2020-11-18 ENCOUNTER — Encounter: Payer: Self-pay | Admitting: Internal Medicine

## 2020-11-18 ENCOUNTER — Ambulatory Visit (INDEPENDENT_AMBULATORY_CARE_PROVIDER_SITE_OTHER): Payer: Medicare Other | Admitting: Internal Medicine

## 2020-11-18 ENCOUNTER — Other Ambulatory Visit: Payer: Self-pay

## 2020-11-18 VITALS — BP 118/78 | HR 80 | Temp 96.9°F | Ht 71.0 in | Wt 185.0 lb

## 2020-11-18 DIAGNOSIS — J84112 Idiopathic pulmonary fibrosis: Secondary | ICD-10-CM

## 2020-11-18 DIAGNOSIS — Z5181 Encounter for therapeutic drug level monitoring: Secondary | ICD-10-CM

## 2020-11-18 DIAGNOSIS — G4733 Obstructive sleep apnea (adult) (pediatric): Secondary | ICD-10-CM | POA: Diagnosis not present

## 2020-11-18 DIAGNOSIS — Z7409 Other reduced mobility: Secondary | ICD-10-CM

## 2020-11-18 DIAGNOSIS — R490 Dysphonia: Secondary | ICD-10-CM

## 2020-11-18 LAB — HEPATIC FUNCTION PANEL
ALT: 22 U/L (ref 0–53)
AST: 17 U/L (ref 0–37)
Albumin: 4 g/dL (ref 3.5–5.2)
Alkaline Phosphatase: 64 U/L (ref 39–117)
Bilirubin, Direct: 0.1 mg/dL (ref 0.0–0.3)
Total Bilirubin: 0.5 mg/dL (ref 0.2–1.2)
Total Protein: 7.4 g/dL (ref 6.0–8.3)

## 2020-11-18 NOTE — Patient Instructions (Signed)
Spirometry/DLCO performed today. 

## 2020-11-18 NOTE — Addendum Note (Signed)
Addended by: Lorretta Harp on: 11/18/2020 12:02 PM   Modules accepted: Orders

## 2020-11-18 NOTE — Patient Instructions (Addendum)
ICD-10-CM   1. IPF (idiopathic pulmonary fibrosis) ([place])  J84.112     2. Medication monitoring encounter  Z51.81     3. OSA (obstructive sleep apnea)  G47.33     4. Immobility  Z74.09     5. Hoarseness of voice  R49.0       Weight loss and GI symptoms have subsided after reducing pirfenidone to 801 mg twice daily Glad you are tolerating this dose Pulmonary fibrosis itself appears stable overall on breathing test and symptom score  Plan -Check liver function test today 11/18/2020 -Can rechallenge and increase pirfenidone to 801 mg 3 times daily   -Take it with food, drink a lot of water and also try ginger capsules  -If side effects return then reduced to 800 mg twice daily -We again discussed clinical trials as a care option; appreciate your interest  -We will keep you posted if you qualify for any trials taking into account her immobility from spinal issues -Await starting CPAP end of 2022 once supply chain is restored -Repeat spirometry and DLCO test in 3 months - refer ENT for hoarseness   Follow-up -Virtual visit in 4 weeks with nurse practitioner to ensure higher dose of pirfenidone is going well for you -Spirometry and DLCO in 3 months -Return to see Dr. Chase Caller in 3 months on a 30-minute visit or sooner if needed

## 2020-11-18 NOTE — Addendum Note (Signed)
Addended by: Suzzanne Cloud E on: 11/18/2020 12:08 PM   Modules accepted: Orders

## 2020-11-18 NOTE — Progress Notes (Signed)
Spirometry and DLCO performed today. °
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
## 2020-11-19 NOTE — Progress Notes (Signed)
HPI M former smoker(30 pk yrs) followed for OSA, complicated by CAD/ MI, AFIB, HTN, Allergic Rhinitis, COPD, ILD, Nocturnal Hypoxemia,Lung Nodules (Dr Chase Caller), Degenertive Disc Disease, ETOH, Hyperlipidemia, HST 06/10/20- AHI 31/ hr, saturation 80%-89%, body weight 194 lbs  ================================================================  05/13/20- 19 yoM former smoker (30 pkyrs) for sleep evaluation with concern of OSA Medical problem list includes CAD/ MI, AFIB, HTN, Allergic Rhinitis, COPD, ILD, Lung Nodules (Dr Chase Caller), Degenertive Disc Disease, ETOH, Hyperlipidemia,  Epworth score- 5 Body weight today-194 lbs Covid vax-3 Phizer Flu vax-had -----Patient states that he snores and wakes up often, states he has been tested before and remote sleep study said he was right on the line for it. Wakes up about 4-5 times a night. Some nights is very restless. Was told at his prior sleep study that his legs were very active. He has punched head board of bed, but not wife. Denies sleep walking. Denies family hx of OSA, dementia or Parkinson's.  ENT surgery+ tonsils.  Bedtime 8PM, waso 4x before up 7AM.  Gabapentin.   11/20/20- 17 yoM former smoker(30 pk yrs) followed for OSA, complicated by CAD/ MI, AFIB, HTN, Allergic Rhinitis, COPD, ILD, Nocturnal Hypoxemia,Lung Nodules (Dr Chase Caller), Degenertive Disc Disease, ETOH, Hyperlipidemia, HST 06/10/20- AHI 31/ hr, saturation 80%-89%, body weight 194 lbs CPAP auto 5-20/ [employer] ordered 6/22   Coming now for required ov on CPAP AirSense 11 AutoSet Download- compliance 100%, AHI 1.3/ hr Body weight today-186 lbs Covid vax- 4 Phizer Began ginger root for nausea from [facility] Reports good start on CPAP. Comfortable and learning to sleep with it.  Being evaluated for vocal weakness.   ROS-see HPI   + = positive Constitutional:    weight loss, night sweats, fevers, chills, fatigue, lassitude. HEENT:    headaches, difficulty swallowing, tooth/dental  problems, sore throat,       sneezing, itching, ear ache, nasal congestion, post nasal drip, snoring CV:    chest pain, orthopnea, PND, swelling in lower extremities, anasarca,                                   dizziness, palpitations Resp:   +shortness of breath with exertion or at rest.                productive cough,   non-productive cough, coughing up of blood.              change in color of mucus.  wheezing.   Skin:    rash or lesions. GI:  No-   heartburn, indigestion, abdominal pain, nausea, vomiting, diarrhea,                 change in bowel habits, loss of appetite GU: dysuria, change in color of urine, no urgency or frequency.   flank pain. MS:   joint pain, stiffness, decreased range of motion, back pain. Neuro-     + peripheral neuropathy Psych:  change in mood or affect.  depression or anxiety.   memory loss.  OBJ- Physical Exam    +overweight General- Alert, Oriented, Affect-appropriate, Distress- none acute Skin- rash-none, lesions- none, excoriation- none Lymphadenopathy- none Head- atraumatic            Eyes- Gross vision intact, PERRLA, conjunctivae and secretions clear            Ears- Hearing, canals-normal            Nose- Clear, no-Septal  dev, mucus, polyps, erosion, perforation             Throat- Mallampati III , mucosa clear , drainage- none, tonsils- absent,  +teeth Neck- flexible , trachea midline, no stridor , thyroid nl, carotid no bruit Chest - symmetrical excursion , unlabored           Heart/CV- RRR , no murmur , no gallop  , no rub, nl s1 s2                           - JVD- none , edema- none, stasis changes- none, varices- none           Lung- +breathless during speech on room air. + I don't hear significant crackles. wheeze- none, cough- none , dullness-none, rub- none           Chest wall-  Abd-  Br/ Gen/ Rectal- Not done, not indicated Extrem- cyanosis- none, clubbing, none, atrophy- none, strength- nl Neuro- +tremor R hand

## 2020-11-20 ENCOUNTER — Ambulatory Visit: Payer: Medicare Other | Admitting: Internal Medicine

## 2020-11-20 ENCOUNTER — Encounter: Payer: Self-pay | Admitting: Internal Medicine

## 2020-11-20 ENCOUNTER — Other Ambulatory Visit: Payer: Self-pay

## 2020-11-20 VITALS — BP 116/64 | HR 85 | Temp 97.6°F | Ht 71.0 in | Wt 186.0 lb

## 2020-11-20 DIAGNOSIS — J84112 Idiopathic pulmonary fibrosis: Secondary | ICD-10-CM

## 2020-11-20 DIAGNOSIS — G4733 Obstructive sleep apnea (adult) (pediatric): Secondary | ICD-10-CM | POA: Diagnosis not present

## 2020-11-20 NOTE — Patient Instructions (Signed)
Order- DME [name]- continue CPAP auto 5-20, mask of choice, humidifier, supplies, AirView/ card  Please keep Dr Chase Caller involved with your shortness of breath.

## 2020-11-25 ENCOUNTER — Encounter: Payer: Self-pay | Admitting: Internal Medicine

## 2020-11-25 ENCOUNTER — Ambulatory Visit (INDEPENDENT_AMBULATORY_CARE_PROVIDER_SITE_OTHER): Payer: Medicare Other

## 2020-11-25 VITALS — Ht 71.0 in | Wt 186.0 lb

## 2020-11-25 DIAGNOSIS — Z0000 Encounter for general adult medical examination without abnormal findings: Secondary | ICD-10-CM | POA: Diagnosis not present

## 2020-11-25 NOTE — Assessment & Plan Note (Signed)
Good start and expect benefit from CPAP. Wife confirms it stops snoring. Plan- continue auto 5-20

## 2020-11-25 NOTE — Progress Notes (Addendum)
Subjective:   Robert Lang is a 70 y.o. male who presents for an Initial Medicare Annual Wellness Visit.  I connected with Robert Lang today by telephone and verified that I am speaking with the correct person using two identifiers. Location patient: home Location provider: work Persons participating in the virtual visit: patient, Marine scientist.    I discussed the limitations, risks, security and privacy concerns of performing an evaluation and management service by telephone and the availability of in person appointments. I also discussed with the patient that there may be a patient responsible charge related to this service. The patient expressed understanding and verbally consented to this telephonic visit.    Interactive audio and video telecommunications were attempted between this provider and patient, however failed, due to patient having technical difficulties OR patient did not have access to video capability.  We continued and completed visit with audio only.  Some vital signs may be absent or patient reported.   Time Spent with patient on telephone encounter: 20 minutes   Review of Systems     Cardiac Risk Factors include: advanced age (>5mn, >>37women);male gender;dyslipidemia;hypertension;sedentary lifestyle     Objective:    Today's Vitals   11/25/20 0941  Weight: 186 lb (84.4 kg)  Height: '5\' 11"'$  (1.803 m)   Body mass index is 25.94 kg/m.  Advanced Directives 11/25/2020 01/30/2020 10/19/2019 05/16/2019 01/10/2019 01/10/2019  Does Patient Have a Medical Advance Directive? Yes No Yes Yes - No  Type of AParamedicof AHerronLiving will - - [place]ure[place] will - -  Does patient want to make changes to medical advance directive? - - - Yes (Inpatient - patient defers changing a medical advance directive at this time - Information given) - -  Copy of [place] Chart? No - copy requested - - No - copy requested -  -  Would patient like information on creating a medical advance directive? - - - - No - Patient declined -    Current Medications (verified) Outpatient Encounter Medications as of 11/25/2020  Medication Sig   atorvastatin (LIPITOR) 80 MG tablet TAKE 1 TABLET(80 MG) BY MOUTH DAILY AT 6 PM   buPROPion (WELLBUTRIN XL) 300 MG 24 hr tablet Take 1 tablet (300 mg total) by mouth daily.   Cholecalciferol 25 MCG (1000 UT) tablet Take 1,000 Units by mouth daily.    docusate sodium (COLACE) 50 MG capsule Take 50 mg by mouth at bedtime.   enalapril (VASOTEC) 2.5 MG tablet Take 2.5 mg by mouth daily.   famotidine (PEPCID) 40 MG tablet TAKE 1 TABLET(40 MG) BY MOUTH DAILY   fexofenadine (ALLEGRA) 180 MG tablet Take 180 mg by mouth at bedtime.    finasteride (PROSCAR) 5 MG tablet Take 1 tablet (5 mg total) by mouth daily.   gabapentin (NEURONTIN) 100 MG capsule 4 tab po q hs.   Ginger, Zingiber officinalis, (GINGER ROOT) 500 MG CAPS Take 500 mg by mouth.   Multiple Vitamins-Minerals (PRESERVISION AREDS PO) Take 2 capsules by mouth in the morning and at bedtime.   nitroGLYCERIN (NITROSTAT) 0.4 MG SL tablet Place 1 tablet (0.4 mg total) under the tongue every 5 (five) minutes as needed for chest pain.   pantoprazole (PROTONIX) 40 MG tablet Take 40 mg by mouth daily.   Pirfenidone (ESBRIET) 801 MG TABS Take 801 mg by mouth with breakfast, with lunch, and with evening meal. (Patient taking differently: Take 801 mg by mouth in the morning and at  bedtime.)   Polyethylene Glycol 3350 (MIRALAX PO) Take by mouth at bedtime.   ranolazine (RANEXA) 1000 MG SR tablet Take 1 tablet (1,000 mg total) by mouth 2 (two) times daily.   sertraline (ZOLOFT) 100 MG tablet Take 150 mg by mouth at bedtime.    solifenacin (VESICARE) 5 MG tablet Take 5 mg by mouth daily.   traZODone (DESYREL) 50 MG tablet TAKE 1/2 TO 1 TABLET(25 TO 50 MG) BY MOUTH AT BEDTIME AS NEEDED FOR SLEEP   Turmeric 500 MG CAPS Take 1 capsule by mouth 2 (two)  times daily.    vitamin B-12 (CYANOCOBALAMIN) 1000 MCG tablet Take 1,000 mcg by mouth daily.   XARELTO 20 MG TABS tablet TAKE 1 TABLET(20 MG) BY MOUTH DAILY WITH SUPPER   No facility-administered encounter medications on file as of 11/25/2020.    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 M75.00)   Allergic rhinitis due to pollen 09/03/2013   J30.1)  Formatting of this note might be different from the original. J30.1)   Allergy    Anticoagulated 07/01/2016   Anxiety    Arthritis    Atrial fibrillation ([place])    Atrial flutter ([place]) 06/26/2015   CAD (coronary artery disease)    Chest pain 06/26/2015   COPD (chronic obstructive pulmonary disease) ([place])    Coronary artery disease 07/06/2018   Cardiac catheterization 2017 showing 90% small diagonal branch disease   DDD (degenerative disc disease), cervical 09/03/2013   M50.90)  Formatting of this note might be different from the original. M50.90)   Depression    Depression    Depressive disorder 09/03/2013   Dizziness 10/28/2017   Dyslipidemia, goal LDL below 70 07/06/2018   Dyspnea on exertion 10/20/2018   Esophageal reflux 09/03/2013   Essential hypertension 07/06/2018   ETOH abuse 01/11/2019   6 pack of beer per day   Falls 10/28/2017   GERD (gastroesophageal reflux disease)    H/O amiodarone therapy 07/01/2016   Heart attack ([place])    Heart palpitations 01/27/2017   History of colon polyps    Hyperlipidemia    Hypertension    IPF (idiopathic pulmonary fibrosis) ([place])    Kidney stones    Neck pain 09/03/2013   Neuropathy 07/06/2018   Obstructive sleep apnea 08/05/2015   PLMD (periodic limb movement disorder) 11/04/2015   Polycythemia, 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 STORY: Due to alcohol/ tobacco   Pure hypercholesterolemia 09/03/2013   E78.0)  Formatting of this note might be different from the original. E78.0)   Screening for prostate cancer 09/03/2013   Smoker 01/11/2019   Smoking 07/06/2018   Status post ablation of atrial flutter 07/06/2018   2017   Past Surgical History:  Procedure Laterality Date   ATRIAL FIBRILLATION ABLATION  10/2015   BACK SURGERY     CARDIOVERSION  2017   CATARACT EXTRACTION Bilateral 2017   March and April 2017   COLONOSCOPY  2018   CORONARY STENT PLACEMENT  2012   CORONARY/GRAFT ACUTE MI REVASCULARIZATION N/A 01/10/2019   Procedure: CORONARY/GRAFT ACUTE MI REVASCULARIZATION;  Surgeon: Leonie Man, MD;  Location: [facility];  Service: Cardiovascular;  Laterality: N/A;   INGUINAL  HERNIA REPAIR     over 20 years ago   LEFT HEART CATH AND CORONARY ANGIOGRAPHY N/A 01/10/2019   Procedure: LEFT HEART CATH AND CORONARY ANGIOGRAPHY;  Surgeon: Leonie Man, MD;  Location: [facility];  Service: Cardiovascular;  Laterality: N/A;   LUMBAR LAMINECTOMY Bilateral 04/05/2016   L2-L5    VENTRAL HERNIA REPAIR  2018   Family History  Problem Relation Age of Onset   Colon cancer Father    Throat cancer Brother    Social History   Socioeconomic History   Marital status: Married    Spouse name: Not on file   Number of children: Not on file   Years of education: Not on file   Highest education level: Not on file  Occupational History   Not on file  Tobacco Use   Smoking status: Former    Packs/day: 1.00    Years: 30.00    Pack years: 30.00    Types: Cigarettes    Quit date: 01/09/2018    Years since quitting: 2.8   Smokeless tobacco: Former  Scientific laboratory technician Use: Never used  Substance and Sexual Activity   Alcohol use: Yes    Comment: 8oz of wine a day   Drug use: Never   Sexual activity: Not on file  Other  Topics Concern   Not on file  Social History Narrative   Not on file   Social Determinants of Health   Financial Resource Strain: Low Risk    Difficulty of Paying Living Expenses: Not hard at all  Food Insecurity: No Food Insecurity   Worried About Charity fundraiser in the Last Year: Never true   [place] in the Last Year: Never true  Transportation Needs: No Transportation Needs   Lack of Transportation (Medical): No   Lack of Transportation (Non-Medical): No  Physical Activity: Inactive   Days of Exercise per Week: 0 days   Minutes of Exercise per Session: 0 min  Stress: No Stress Concern Present   Feeling of Stress : Not at all  Social Connections: Moderately Integrated   Frequency of Communication with Friends and Family: More than three times a week   Frequency of Social Gatherings with Friends and Family: More than three times a week   Attends Religious Services: More than 4 times per year   Active Member of [employer] or Organizations: No   Attends Music therapist: Never   Marital Status: Married    Tobacco Counseling Counseling given: Not Answered   Clinical Intake:  Pre-visit preparation completed: Yes        BMI - recorded: 25.94 Nutritional Status: BMI 25 -29 Overweight Nutritional Risks: None Diabetes: No  How often do you need to have someone help you when you read instructions, pamphlets, or other written materials from your doctor or pharmacy?: 1 - Never  Diabetic?No  Interpreter Needed?: No  Information entered by :: Caroleen Hamman LPN   Activities of Daily Living In your present state of health, do you have any difficulty performing the following activities: 11/25/2020 08/12/2020  Hearing? N N  Vision? N N  Difficulty concentrating or making decisions? Y N  Comment occasionally forgets names -  Walking or climbing stairs? Y Y  Dressing or bathing? N N  Doing errands, shopping? Y N  Preparing Food and eating ? Y -  Using  the Toilet? N -  In the past six months, have you accidently leaked urine?  N -  Do you have problems with loss of bowel control? N -  Managing your Medications? Y -  Managing your Finances? Y -  Housekeeping or managing your Housekeeping? Y -  Some recent data might be hidden    Patient Care Team: [name], Iris Pert as PCP - General (Internal Medicine) Park Liter, MD as PCP - Cardiology (Cardiology)  Indicate any recent Medical Services you may have received from other than [facility] providers in the past year (date may be approximate).     Assessment:   This is a routine wellness examination for Robert Lang.  Hearing/Vision screen Hearing Screening - Comments:: No issues Vision Screening - Comments:: Wears glasses Last eye exam-6 months  Dietary issues and exercise activities discussed: Current Exercise Habits: The patient does not participate in regular exercise at present, Exercise limited by: respiratory conditions(s)   Goals Addressed             This Visit's Progress    Patient Stated       Drink more water & continue eating healthy       Depression Screen PHQ 2/9 Scores 11/25/2020 10/13/2020  PHQ - 2 Score 1 6  PHQ- 9 Score - 17    Fall Risk Fall Risk  11/25/2020 10/13/2020  Falls in the past year? 1 1  Number falls in past yr: 1 1  Injury with Fall? 0 0  Risk for fall due to : History of fall(s);Impaired mobility -  Follow up Falls prevention discussed -    FALL RISK PREVENTION PERTAINING TO THE HOME:  Any stairs in or around the home? Yes  If so, are there any without handrails? No  Home free of loose throw rugs in walkways, pet beds, electrical cords, etc? Yes  Adequate lighting in your home to reduce risk of falls? Yes   ASSISTIVE DEVICES UTILIZED TO PREVENT FALLS:  Life alert? No  Use of a cane, walker or w/c? Yes  Grab bars in the bathroom? Yes  Shower chair or bench in shower? Yes  Elevated toilet seat or a handicapped toilet? No   TIMED  UP AND GO:  Was the test performed? No . Phone visit   Cognitive Function:Patient states he sometimes forgets names & has to write all of his appointments on a calender.    6CIT Screen 11/25/2020  What Year? 0 points  What month? 0 points  What time? 0 points  Count back from 20 0 points  Months in reverse 2 points  Repeat phrase 2 points  Total Score 4    Immunizations Immunization History  Administered Date(s) Administered   Influenza Split 01/09/2010, 02/09/2011   Influenza, High Dose Seasonal PF 11/15/2018, 01/11/2020   Influenza,inj,Quad PF,6+ Mos 01/01/2015   Influenza-Unspecified 01/30/2014, 02/02/2016   PFIZER(Purple Top)SARS-COV-2 Vaccination 04/27/2019, 05/18/2019, 01/14/2020, 07/22/2020   Pneumococcal Conjugate-13 11/23/2018   Pneumococcal Polysaccharide-23 08/12/2020   Tdap 02/18/2011    TDAP status: Up to date  Flu Vaccine status: Due, Education has been provided regarding the importance of this vaccine. Advised may receive this vaccine at local pharmacy or Health Dept. Aware to provide a copy of the vaccination record if obtained from local pharmacy or Health Dept. Verbalized acceptance and understanding.  Pneumococcal vaccine status: Up to date  Covid-19 vaccine status: Completed vaccines  Qualifies for Shingles Vaccine? Yes   Zostavax completed No   Shingrix Completed?: No.    Education has been provided regarding the importance of this vaccine. Patient has been advised  to call insurance company to determine out of pocket expense if they have not yet received this vaccine. Advised may also receive vaccine at local pharmacy or Health Dept. Verbalized acceptance and understanding.  Screening Tests Health Maintenance  Topic Date Due   Hepatitis C Screening  Never done   Zoster Vaccines- Shingrix (1 of 2) Never done   COLONOSCOPY (Pts 45-5yr Insurance coverage will need to be confirmed)  Never done   INFLUENZA VACCINE  10/20/2020   COVID-19 Vaccine (5 -  Booster for Pfizer series) 11/22/2020   TETANUS/TDAP  02/17/2021   PNA vac Low Risk Adult  Completed   HPV VACCINES  Aged Out    Health Maintenance  Health Maintenance Due  Topic Date Due   Hepatitis C Screening  Never done   Zoster Vaccines- Shingrix (1 of 2) Never done   COLONOSCOPY (Pts 45-49yrInsurance coverage will need to be confirmed)  Never done   INFLUENZA VACCINE  10/20/2020   COVID-19 Vaccine (5 - Booster for PfClovereries) 11/22/2020    Colorectal cancer screening: No report available. Patient states he is up to date.  Lung Cancer Screening: Patient had a Chest CT on 05/08/2020   Additional Screening:  Hepatitis C Screening: does qualify; Discuss with PCP  Vision Screening: Recommended annual ophthalmology exams for early detection of glaucoma and other disorders of the eye. Is the patient up to date with their annual eye exam?  Yes  Who is the provider or what is the name of the office in which the patient attends annual eye exams? Pt unsure of name   Dental Screening: Recommended annual dental exams for proper oral hygiene  Community Resource Referral / Chronic Care Management: CRR required this visit?  No   CCM required this visit?  No      Plan:     I have personally reviewed and noted the following in the patient's chart:   Medical and social history Use of alcohol, tobacco or illicit drugs  Current medications and supplements including opioid prescriptions. Patient is not currently taking opioid prescriptions. Functional ability and status Nutritional status Physical activity Advanced directives List of other physicians Hospitalizations, surgeries, and ER visits in previous 12 months Vitals Screenings to include cognitive, depression, and falls Referrals and appointments  In addition, I have reviewed and discussed with patient certain preventive protocols, quality metrics, and best practice recommendations. A written personalized care plan  for preventive services as well as general preventive health recommendations were provided to patient.   Due to this being a telephonic visit, the after visit summary with patients personalized plan was offered to patient via mail or my-chart.  Patient would like to access on my-chart.   MaMarta AntuLPN   [id number] Health Advisor  Nurse Notes: None  Reviewed and agree with assessement & plan of RN.  EdMackie PaiPA-C

## 2020-11-25 NOTE — Patient Instructions (Signed)
Robert Lang , Thank you for taking time to complete your Medicare Wellness Visit. I appreciate your ongoing commitment to your health goals. Please review the following plan we discussed and let me know if I can assist you in the future.   Screening recommendations/referrals: Colonoscopy: No report available. Please follow recommendations given by GI for repeat colonoscopy date. Recommended yearly ophthalmology/optometry visit for glaucoma screening and checkup Recommended yearly dental visit for hygiene and checkup  Vaccinations: Influenza vaccine: Due-May obtain vaccine in our office or your local pharmacy. Pneumococcal vaccine: Up to date Tdap vaccine: Up to date-Due-02/17/2021 Shingles vaccine: Discuss with pharmacy   Covid-19: Up to date  Advanced directives: Please bring a copy for your chart  Conditions/risks identified: See problem list  Next appointment: Follow up in one year for your annual wellness visit.   Preventive Care 30 Years and Older, Male Preventive care refers to lifestyle choices and visits with your health care provider that can promote health and wellness. What does preventive care include? A yearly physical exam. This is also called an annual well check. Dental exams once or twice a year. Routine eye exams. Ask your health care provider how often you should have your eyes checked. Personal lifestyle choices, including: Daily care of your teeth and gums. Regular physical activity. Eating a healthy diet. Avoiding tobacco and drug use. Limiting alcohol use. Practicing safe sex. Taking low doses of aspirin every day. Taking vitamin and mineral supplements as recommended by your health care provider. What happens during an annual well check? The services and screenings done by your health care provider during your annual well check will depend on your age, overall health, lifestyle risk factors, and family history of disease. Counseling  Your health care  provider may ask you questions about your: Alcohol use. Tobacco use. Drug use. Emotional well-being. Home and relationship well-being. Sexual activity. Eating habits. History of falls. Memory and ability to understand (cognition). Work and work Statistician. Screening  You may have the following tests or measurements: Height, weight, and BMI. Blood pressure. Lipid and cholesterol levels. These may be checked every 5 years, or more frequently if you are over 55 years old. Skin check. Lung cancer screening. You may have this screening every year starting at age 36 if you have a 30-pack-year history of smoking and currently smoke or have quit within the past 15 years. Fecal occult blood test (FOBT) of the stool. You may have this test every year starting at age 46. Flexible sigmoidoscopy or colonoscopy. You may have a sigmoidoscopy every 5 years or a colonoscopy every 10 years starting at age 51. Prostate cancer screening. Recommendations will vary depending on your family history and other risks. Hepatitis C blood test. Hepatitis B blood test. Sexually transmitted disease (STD) testing. Diabetes screening. This is done by checking your blood sugar (glucose) after you have not eaten for a while (fasting). You may have this done every 1-3 years. Abdominal aortic aneurysm (AAA) screening. You may need this if you are a current or former smoker. Osteoporosis. You may be screened starting at age 74 if you are at high risk. Talk with your health care provider about your test results, treatment options, and if necessary, the need for more tests. Vaccines  Your health care provider may recommend certain vaccines, such as: Influenza vaccine. This is recommended every year. Tetanus, diphtheria, and acellular pertussis (Tdap, Td) vaccine. You may need a Td booster every 10 years. Zoster vaccine. You may need this after age  60. Pneumococcal 13-valent conjugate (PCV13) vaccine. One dose is  recommended after age 107. Pneumococcal polysaccharide (PPSV23) vaccine. One dose is recommended after age 52. Talk to your health care provider about which screenings and vaccines you need and how often you need them. This information is not intended to replace advice given to you by your health care provider. Make sure you discuss any questions you have with your health care provider. Document Released: 04/04/2015 Document Revised: 11/26/2015 Document Reviewed: 01/07/2015 Elsevier Interactive Patient Education  2017 [employer] Prevention in the Home Falls can cause injuries. They can happen to people of all ages. There are many things you can do to make your home safe and to help prevent falls. What can I do on the outside of my home? Regularly fix the edges of walkways and driveways and fix any cracks. Remove anything that might make you trip as you walk through a door, such as a raised step or threshold. Trim any bushes or trees on the path to your home. Use bright outdoor lighting. Clear any walking paths of anything that might make someone trip, such as rocks or tools. Regularly check to see if handrails are loose or broken. Make sure that both sides of any steps have handrails. Any raised decks and porches should have guardrails on the edges. Have any leaves, snow, or ice cleared regularly. Use sand or salt on walking paths during winter. Clean up any spills in your garage right away. This includes oil or grease spills. What can I do in the bathroom? Use night lights. Install grab bars by the toilet and in the tub and shower. Do not use towel bars as grab bars. Use non-skid mats or decals in the tub or shower. If you need to sit down in the shower, use a plastic, non-slip stool. Keep the floor dry. Clean up any water that spills on the floor as soon as it happens. Remove soap buildup in the tub or shower regularly. Attach bath mats securely with double-sided non-slip rug  tape. Do not have throw rugs and other things on the floor that can make you trip. What can I do in the bedroom? Use night lights. Make sure that you have a light by your bed that is easy to reach. Do not use any sheets or blankets that are too big for your bed. They should not hang down onto the floor. Have a firm chair that has side arms. You can use this for support while you get dressed. Do not have throw rugs and other things on the floor that can make you trip. What can I do in the kitchen? Clean up any spills right away. Avoid walking on wet floors. Keep items that you use a lot in easy-to-reach places. If you need to reach something above you, use a strong step stool that has a grab bar. Keep electrical cords out of the way. Do not use floor polish or wax that makes floors slippery. If you must use wax, use non-skid floor wax. Do not have throw rugs and other things on the floor that can make you trip. What can I do with my stairs? Do not leave any items on the stairs. Make sure that there are handrails on both sides of the stairs and use them. Fix handrails that are broken or loose. Make sure that handrails are as long as the stairways. Check any carpeting to make sure that it is firmly attached to the stairs. Fix  any carpet that is loose or worn. Avoid having throw rugs at the top or bottom of the stairs. If you do have throw rugs, attach them to the floor with carpet tape. Make sure that you have a light switch at the top of the stairs and the bottom of the stairs. If you do not have them, ask someone to add them for you. What else can I do to help prevent falls? Wear shoes that: Do not have high heels. Have rubber bottoms. Are comfortable and fit you well. Are closed at the toe. Do not wear sandals. If you use a stepladder: Make sure that it is fully opened. Do not climb a closed stepladder. Make sure that both sides of the stepladder are locked into place. Ask someone to  hold it for you, if possible. Clearly mark and make sure that you can see: Any grab bars or handrails. First and last steps. Where the edge of each step is. Use tools that help you move around (mobility aids) if they are needed. These include: Canes. Walkers. Scooters. Crutches. Turn on the lights when you go into a dark area. Replace any light bulbs as soon as they burn out. Set up your furniture so you have a clear path. Avoid moving your furniture around. If any of your floors are uneven, fix them. If there are any pets around you, be aware of where they are. Review your medicines with your doctor. Some medicines can make you feel dizzy. This can increase your chance of falling. Ask your doctor what other things that you can do to help prevent falls. This information is not intended to replace advice given to you by your health care provider. Make sure you discuss any questions you have with your health care provider. Document Released: 01/02/2009 Document Revised: 08/14/2015 Document Reviewed: 04/12/2014 Elsevier Interactive Patient Education  2017 [employer].

## 2020-11-25 NOTE — Assessment & Plan Note (Signed)
I don't hear much crackle or wheeze and note absence of JVD/ edema. Arrival O2 sat 98% room air Needs attention to other possible bases for dyspnea and recognize potential overlap with vocal weakness as he follows up for pulmonary with Dr Chase Caller.

## 2020-11-28 LAB — PULMONARY FUNCTION TEST
DL/VA % pred: 79 %
DL/VA: 3.22 ml/min/mmHg/L
DLCO cor % pred: 74 %
DLCO cor: 19.95 ml/min/mmHg
DLCO unc % pred: 74 %
DLCO unc: 19.95 ml/min/mmHg
FEF 25-75 Pre: 2.38 L/sec
FEF2575-%Pred-Pre: 92 %
FEV1-%Pred-Pre: 77 %
FEV1-Pre: 2.63 L
FEV1FVC-%Pred-Pre: 107 %
FEV6-%Pred-Pre: 76 %
FEV6-Pre: 3.33 L
FEV6FVC-%Pred-Pre: 105 %
FVC-%Pred-Pre: 72 %
FVC-Pre: 3.33 L
Pre FEV1/FVC ratio: 79 %
Pre FEV6/FVC Ratio: 100 %

## 2020-12-15 DIAGNOSIS — G4733 Obstructive sleep apnea (adult) (pediatric): Secondary | ICD-10-CM | POA: Diagnosis not present

## 2020-12-17 ENCOUNTER — Other Ambulatory Visit: Payer: Self-pay | Admitting: Cardiology

## 2020-12-18 ENCOUNTER — Other Ambulatory Visit: Payer: Self-pay | Admitting: Cardiology

## 2020-12-19 ENCOUNTER — Telehealth: Payer: Self-pay | Admitting: Primary Care

## 2020-12-19 ENCOUNTER — Other Ambulatory Visit: Payer: Self-pay

## 2020-12-19 ENCOUNTER — Telehealth: Payer: Self-pay | Admitting: Internal Medicine

## 2020-12-19 ENCOUNTER — Ambulatory Visit (INDEPENDENT_AMBULATORY_CARE_PROVIDER_SITE_OTHER): Payer: Medicare Other | Admitting: Primary Care

## 2020-12-19 DIAGNOSIS — R490 Dysphonia: Secondary | ICD-10-CM

## 2020-12-19 NOTE — Telephone Encounter (Signed)
Can we make sure this patient has an appointment in November with Dr. Raiford Noble please?

## 2020-12-19 NOTE — Telephone Encounter (Signed)
Can we follow up on motorized WC order in June from Dr. Chase Caller

## 2020-12-19 NOTE — Patient Instructions (Signed)
We will look into wheelchair Continue Esbriet three times daily Follow up in November with Dr. Chase Caller

## 2020-12-19 NOTE — Progress Notes (Signed)
Virtual Visit via Telephone Note  I connected with Robert Lang on 12/19/20 at 11:30 AM EDT by telephone and verified that I am speaking with the correct person using two identifiers.  Location: Patient: Home  Provider: Office   I discussed the limitations, risks, security and privacy concerns of performing an evaluation and management service by telephone and the availability of in person appointments. I also discussed with the patient that there may be a patient responsible charge related to this service. The patient expressed understanding and agreed to proceed.   History of Present Illness: 70 year old male, former smoker. PMH significant for COPD, IPF, obstructive sleep apnea, CAD, HTN, afib, GERD, dyslipidemia.   Patient follows with Dr. Annamaria Boots for sleep apnea; Dr. Chase Caller for IPF.    Previous [facility] pulmonary encounter: 11/18/2020 -  Dr. 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 The diagnosis based on the fact that you age over 25, previous smoking, you occupational exposures, negative serology, Caucasian ethnicity, male gender and probable UIP pattern on CT scan   Started esbriet - one week after easter 2022   Weight loss after starting Esbriet.  2 antihypertensives had to be stopped   OSA CPAP pending end of 2022   Immobility due to spinal issues   Robert Lang 70 y.o. -returns for follow-up.  At last visit we had reduce his pirfenidone to 801 milligrams twice daily.  This is because of weight loss and GI side effects.  After reducing pirfenidone he feels well.  Respiratory symptoms are stable.  Pulmonary function test done shows improvement in FVC but decline in DLCO.  Overall stable.  He is here to start his CPAP.  He could not afford a motorized wheelchair because of $1200 payment.  Instead he is gotten regular wheelchair.  We discussed clinical trials as a care option and is preliminarily interested.   Of note he has a new symptom: He has  hoarseness of voice.  This is ever since he started pirfenidone.  Wife and he said that he had a recent respiratory infection for which he was COVID-negative and got antibiotics and prednisone but this did not affect the hoarseness of voice.  He denies any cough.  Denies any clearing of the throat.  He feels some of the pirfenidone is related to this.  I personally not seen this with pirfenidone.  He thinks air hunger might be related to causing hoarseness.  He has never had ENT evaluation.  Denies any acid reflux.     11/20/20- Dr. Annamaria Boots  75 yoM former smoker(30 pk yrs) followed for OSA, complicated by CAD/ MI, AFIB, HTN, Allergic Rhinitis, COPD, ILD, Nocturnal Hypoxemia,Lung Nodules (Dr Chase Caller), Degenertive Disc Disease, ETOH, Hyperlipidemia, HST 06/10/20- AHI 31/ hr, saturation 80%-89%, body weight 194 lbs CPAP auto 5-20/ [facility] ordered 6/22   Coming now for required ov on CPAP AirSense 11 AutoSet Download- compliance 100%, AHI 1.3/ hr Body weight today-186 lbs Covid vax- 4 Ph[employer] Began ginger root for nausea from Esbriet Reports good start on CPAP. Comfortable and learning to sleep with it.  Being evaluated for vocal weakness.   12/19/2020- Interim hx  Patient contacted today for follow-up IPF. Pulmonary fibrosis appears stable overall on breathing test and symptom scale. Patient developed symptoms of weight loss and GI symptoms in June 2022, Esbriet was decreased to 801mg  twice daily. He was feeling well at his follow-up with Dr. Chase Caller so Pirfenidone was increased to 801mg  three times daily in August.   He  is doing well. Tolerating Esbriet 801mg  three times daily. He is experiencing less nausea. Current weight reported 182lbs. LFTs were normal in August. He needs to use electric wheelchair when shopping d/t dyspnea symptoms. Wife is unable to push him in wheelchair. He reports losing his voice when speaking, awaiting ENT appointment/referral.    SYMPTOM SCALE - ILD 06/17/2020   07/28/2020    09/09/2020 184# 11/18/2020 185#  Esbriet 801mg  BID 12/19/2020   Esbriet 801mg  TID   O2 use ra RA     RA   Shortness of Breath 0 -> 5 scale with 5 being worst (score 6 If unable to do) 0-5       At rest 3 0.5 2 4 3   Simple tasks - showers, clothes change, eating, shaving 5 3 3 3 2   Household (dishes, doing bed, laundry) 5 3 (he does not do these tasks) 5 0 3 (he does not do these tasks)  Shopping 5 3 (he does not do this often) 3 1 3  (he does not do these tasks)  Walking level at own pace 4 3 3 1 3   Walking up Stairs 5 5 4 1 3   Total (30-36) Dyspnea Score 27 17.5 20 10 17   How bad is your cough? 3 2 1  fair 1  How bad is your fatigue 4 2 4  fair 3  How bad is nausea 0 0 3 good 2  How bad is vomiting?   0 0 0 good 0  How bad is diarrhea? 3 0 0 good 0  How bad is anxiety? 5 5 3  fair 2  How bad is depression 5 5 3  faor 3      Observations/Objective:  - Able to speak in full sentences; no overt shortness of breath or wheezing   Assessment and Plan:  IPF: - Stable; Tolerating Esbriet 801mg  TID - Nausea has improved and weight is stable  - LFTs have been normal x6 months, needs repeat hepatic panel in November   Deconditioning: - Needs motorized wheelchair  Voice hoarseness: - Referral placed to ENT   Follow Up Instructions:  - Due for OV with spirometry in November with Dr. Chase Caller    I discussed the assessment and treatment plan with the patient. The patient was provided an opportunity to ask questions and all were answered. The patient agreed with the plan and demonstrated an understanding of the instructions.   The patient was advised to call back or seek an in-person evaluation if the symptoms worsen or if the condition fails to improve as anticipated.  I provided 25 minutes of non-face-to-face time during this encounter.   Robert Ehrich, NP

## 2020-12-22 NOTE — Telephone Encounter (Signed)
Ok, thank you

## 2020-12-22 NOTE — Telephone Encounter (Signed)
Robert Lang called me back.  She states Robert Lang spoke to pt's wife about chair.  Insurance does not cover all of motorized chair and also pt would have to come into the office to be fitted for it.  They opted to get the manual chair.

## 2020-12-22 NOTE — Telephone Encounter (Signed)
I spoke to Andee Poles this morning and asked about June order.  She checked with Trish and that's what she was told.  Pt got the manual wheelchair instead of motorized because of price difference and having to go in to be fitted for it.

## 2020-12-22 NOTE — Telephone Encounter (Signed)
Was this recent?

## 2020-12-22 NOTE — Telephone Encounter (Signed)
I called [facility] & spoke to [facility].  She remembers Trish speaking to the pt about the wheelchair.  She is going to follow up with her and call me back.

## 2020-12-29 NOTE — Telephone Encounter (Signed)
I called the patient and made a follow up per provider recommendation. Patient knows the date and time and he repeated back to me. No other questions.

## 2021-01-02 DIAGNOSIS — H43821 Vitreomacular adhesion, right eye: Secondary | ICD-10-CM | POA: Diagnosis not present

## 2021-01-02 DIAGNOSIS — H31011 Macula scars of posterior pole (postinflammatory) (post-traumatic), right eye: Secondary | ICD-10-CM | POA: Diagnosis not present

## 2021-01-02 DIAGNOSIS — H35033 Hypertensive retinopathy, bilateral: Secondary | ICD-10-CM | POA: Diagnosis not present

## 2021-01-05 ENCOUNTER — Other Ambulatory Visit: Payer: Self-pay | Admitting: Medical

## 2021-01-05 ENCOUNTER — Telehealth: Payer: Self-pay | Admitting: Internal Medicine

## 2021-01-05 NOTE — Telephone Encounter (Signed)
Spoke with the pt's spouse  She says she called [facility] and they never received our referral  I faxed referral and last ov note to [facility] at the number provided  Nothing further needed

## 2021-01-12 ENCOUNTER — Telehealth: Payer: Self-pay | Admitting: Internal Medicine

## 2021-01-12 DIAGNOSIS — J84112 Idiopathic pulmonary fibrosis: Secondary | ICD-10-CM

## 2021-01-12 MED ORDER — PIRFENIDONE 801 MG PO TABS: 801.0000 mg | ORAL_TABLET | Freq: Three times a day (TID) | ORAL | 5 refills | Status: DC

## 2021-01-12 NOTE — Telephone Encounter (Addendum)
Refil for Esbriet 849m three times daily sent to [facility]  Hepatic Function Latest Ref Rng & Units 11/18/2020 10/13/2020 09/09/2020  Total Protein 6.0 - 8.3 g/dL 7.4 6.9 7.6  Albumin 3.5 - 5.2 g/dL 4.0 4.1 3.9  AST 0 - 37 U/L _0 ALT 0 - 53 U/L _1 Alk Phosphatase 39 - 117 U/L 64 62 71  Total Bilirubin 0.2 - 1.2 mg/dL 0.5 0.6 0.5  Bilirubin, Direct 0.0 - 0.3 mg/dL 0.1 - 0.2    DKnox Saliva PharmD, MPH, BCPS Clinical Pharmacist (Rheumatology and Pulmonology)

## 2021-01-13 ENCOUNTER — Encounter: Payer: Self-pay | Admitting: Medical

## 2021-01-13 ENCOUNTER — Other Ambulatory Visit: Payer: Self-pay

## 2021-01-13 ENCOUNTER — Ambulatory Visit (INDEPENDENT_AMBULATORY_CARE_PROVIDER_SITE_OTHER): Payer: Medicare Other | Admitting: Medical

## 2021-01-13 VITALS — BP 115/78 | HR 100 | Temp 98.1°F | Resp 18 | Ht 71.0 in | Wt 188.6 lb

## 2021-01-13 DIAGNOSIS — I4891 Unspecified atrial fibrillation: Secondary | ICD-10-CM

## 2021-01-13 DIAGNOSIS — I10 Essential (primary) hypertension: Secondary | ICD-10-CM

## 2021-01-13 DIAGNOSIS — F32A Depression, unspecified: Secondary | ICD-10-CM | POA: Diagnosis not present

## 2021-01-13 DIAGNOSIS — F419 Anxiety disorder, unspecified: Secondary | ICD-10-CM

## 2021-01-13 DIAGNOSIS — G2581 Restless legs syndrome: Secondary | ICD-10-CM

## 2021-01-13 DIAGNOSIS — J84112 Idiopathic pulmonary fibrosis: Secondary | ICD-10-CM

## 2021-01-13 MED ORDER — BUSPIRONE HCL 7.5 MG PO TABS
7.5000 mg | ORAL_TABLET | Freq: Two times a day (BID) | ORAL | 0 refills | Status: DC
Start: 2021-01-13 — End: 2021-02-06

## 2021-01-13 NOTE — Progress Notes (Signed)
Subjective:    Patient ID: Robert Lang, male    DOB: 04-27-1950, 70 y.o.   MRN: [id number]  HPI  Pt in for follow up.  Pt states he still constantly feeling short of breath. Seen by pulmonology office 12/19/2020. Pt states he is breathing some mild better with increase of his Esbriet.   Pt on 01-23-2021 will see ENT since he has some new onset laryngitis. Late in the the day almost has to whisper.  Pt admits that he is depresses , anxiety and fatigued over his overall health. In past about 2 years ago was on sertraline. But has not been on that for a while. Pt is on wellbutrin xl 300 mg tab q hs. Pt is on trazadone 1/2 tab q hs.    Review of Systems  Constitutional:  Negative for chills, fatigue and fever.  Respiratory:  Negative for cough, chest tightness, shortness of breath and wheezing.   Cardiovascular:  Negative for chest pain and palpitations.  Gastrointestinal:  Negative for abdominal pain and anal bleeding.  Musculoskeletal:  Negative for back pain.  Skin:  Negative for rash.  Neurological:  Negative for dizziness, seizures, syncope, weakness and light-headedness.  Psychiatric/Behavioral:  Positive for dysphoric mood. Negative for behavioral problems, decreased concentration, self-injury and suicidal ideas. The patient is nervous/anxious.    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 M75.00)   Allergic rhinitis due to pollen 09/03/2013   J30.1)  Formatting of this note might be different from the original. J30.1)   Allergy    Anticoagulated 07/01/2016   Anxiety    Arthritis    Atrial fibrillation ([place])    Atrial flutter ([place]) 06/26/2015   CAD (coronary artery disease)    Chest pain 06/26/2015   COPD (chronic obstructive pulmonary disease) ([place])    Coronary  artery disease 07/06/2018   Cardiac catheterization 2017 showing 90% small diagonal branch disease   DDD (degenerative disc disease), cervical 09/03/2013   M50.90)  Formatting of this note might be different from the original. M50.90)   Depression    Depression    Depressive disorder 09/03/2013   Dizziness 10/28/2017   Dyslipidemia, goal LDL below 70 07/06/2018   Dyspnea on exertion 10/20/2018   Esophageal reflux 09/03/2013   Essential hypertension 07/06/2018   ETOH abuse 01/11/2019   6 pack of beer per day   Falls 10/28/2017   GERD (gastroesophageal reflux disease)    H/O amiodarone therapy 07/01/2016   Heart attack ([place])    Heart palpitations 01/27/2017   History of colon polyps    Hyperlipidemia    Hypertension    IPF (idiopathic pulmonary fibrosis) ([place])    Kidney stones    Neck pain 09/03/2013   Neuropathy 07/06/2018   Obstructive sleep apnea 08/05/2015   PLMD (periodic limb movement disorder) 11/04/2015   Polycythemia, 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 STORY: Due to alcohol/ tobacco   Pure hypercholesterolemia 09/03/2013   E78.0)  Formatting of this note might be different from the original. E78.0)   Screening for prostate cancer 09/03/2013   Smoker 01/11/2019   Smoking 07/06/2018   Status post ablation of atrial flutter 07/06/2018   2017     Social History   Socioeconomic History  Marital status: Married    Spouse name: Not on file   Number of children: Not on file   Years of education: Not on file   Highest education level: Not on file  Occupational History   Not on file  Tobacco Use   Smoking status: Former    Packs/day: 1.00    Years: 30.00    Pack years: 30.00    Types: Cigarettes    Quit date: 01/09/2018    Years since quitting: 3.0   Smokeless tobacco: Former  Scientific laboratory technician Use: Never used  Substance and Sexual Activity   Alcohol use: Yes    Comment: 8oz  of wine a day   Drug use: Never   Sexual activity: Not on file  Other Topics Concern   Not on file  Social History Narrative   Not on file   Social Determinants of Health   Financial Resource Strain: Low Risk    Difficulty of Paying Living Expenses: Not hard at all  Food Insecurity: No Food Insecurity   Worried About Charity fundraiser in the Last Year: Never true   [place] in the Last Year: Never true  Transportation Needs: No Transportation Needs   Lack of Transportation (Medical): No   Lack of Transportation (Non-Medical): No  Physical Activity: Inactive   Days of Exercise per Week: 0 days   Minutes of Exercise per Session: 0 min  Stress: No Stress Concern Present   Feeling of Stress : Not at all  Social Connections: Moderately Integrated   Frequency of Communication with Friends and Family: More than three times a week   Frequency of Social Gatherings with Friends and Family: More than three times a week   Attends Religious Services: More than 4 times per year   Active Member of Clubs or Organizations: No   Attends Archivist Meetings: Never   Marital Status: Married  Human resources officer Violence: Not At Risk   Fear of Current or Ex-Partner: No   Emotionally Abused: No   Physically Abused: No   Sexually Abused: No    Past Surgical History:  Procedure Laterality Date   ATRIAL FIBRILLATION ABLATION  10/2015   BACK SURGERY     CARDIOVERSION  2017   CATARACT EXTRACTION Bilateral 2017   March and April 2017   COLONOSCOPY  2018   CORONARY STENT PLACEMENT  2012   CORONARY/GRAFT ACUTE MI REVASCULARIZATION N/A 01/10/2019   Procedure: CORONARY/GRAFT ACUTE MI REVASCULARIZATION;  Surgeon: Leonie Man, MD;  Location: [facility];  Service: Cardiovascular;  Laterality: N/A;   INGUINAL HERNIA REPAIR     over 20 years ago   LEFT HEART CATH AND CORONARY ANGIOGRAPHY N/A 01/10/2019   Procedure: LEFT HEART CATH AND CORONARY ANGIOGRAPHY;  Surgeon:  Leonie Man, MD;  Location: [facility];  Service: Cardiovascular;  Laterality: N/A;   LUMBAR LAMINECTOMY Bilateral 04/05/2016   L2-L5    VENTRAL HERNIA REPAIR  2018    Family History  Problem Relation Age of Onset   Colon cancer Father    Throat cancer Brother     Allergies  Allergen Reactions   Naproxen Other (See Comments)    (Naprosyn *ANALGESICS - ANTI-INFLAMMATORY*) Nausea, Abdominal pain    Current Outpatient Medications on File Prior to Visit  Medication Sig Dispense Refill   atorvastatin (LIPITOR) 80 MG tablet TAKE 1 TABLET(80 MG) BY MOUTH DAILY AT 6 PM 90 tablet 2   buPROPion (  WELLBUTRIN XL) 300 MG 24 hr tablet Take 1 tablet (300 mg total) by mouth daily. 90 tablet 0   Cholecalciferol 25 MCG (1000 UT) tablet Take 1,000 Units by mouth daily.      docusate sodium (COLACE) 50 MG capsule Take 50 mg by mouth at bedtime.     enalapril (VASOTEC) 2.5 MG tablet Take 2.5 mg by mouth daily.     famotidine (PEPCID) 40 MG tablet TAKE 1 TABLET(40 MG) BY MOUTH DAILY 90 tablet 3   fexofenadine (ALLEGRA) 180 MG tablet Take 180 mg by mouth at bedtime.      finasteride (PROSCAR) 5 MG tablet Take 1 tablet (5 mg total) by mouth daily. 90 tablet 1   gabapentin (NEURONTIN) 100 MG capsule 4 tab po q hs. 120 capsule 3   Ginger, Zingiber officinalis, (GINGER ROOT) 500 MG CAPS Take 500 mg by mouth.     Multiple Vitamins-Minerals (PRESERVISION AREDS PO) Take 2 capsules by mouth in the morning and at bedtime.     nitroGLYCERIN (NITROSTAT) 0.4 MG SL tablet Place 1 tablet (0.4 mg total) under the tongue every 5 (five) minutes as needed for chest pain. 25 tablet 3   pantoprazole (PROTONIX) 40 MG tablet Take 40 mg by mouth daily.     Pirfenidone (ESBRIET) 801 MG TABS Take 801 mg by mouth 3 (three) times daily. 90 tablet 5   Polyethylene Glycol 3350 (MIRALAX PO) Take by mouth at bedtime.     ranolazine (RANEXA) 1000 MG SR tablet Take 1 tablet (1,000 mg total) by mouth 2 (two) times daily. 180  tablet 1   solifenacin (VESICARE) 5 MG tablet Take 5 mg by mouth daily.     traZODone (DESYREL) 50 MG tablet TAKE 1/2 TO 1 TABLET(25 TO 50 MG) BY MOUTH AT BEDTIME AS NEEDED FOR SLEEP 30 tablet 3   Turmeric 500 MG CAPS Take 1 capsule by mouth 2 (two) times daily.      vitamin B-12 (CYANOCOBALAMIN) 1000 MCG tablet Take 1,000 mcg by mouth daily.     XARELTO 20 MG TABS tablet TAKE 1 TABLET(20 MG) BY MOUTH DAILY WITH SUPPER 30 tablet 5   No current facility-administered medications on file prior to visit.    BP (!) 150/80   Pulse 100   Temp 98.1 F (36.7 C)   Resp 18   Ht 5\' 11"  (1.803 m)   Wt 188 lb 9.6 oz (85.5 kg)   SpO2 96%   BMI 26.30 kg/m        Objective:   Physical Exam  General Mental Status- Alert. General Appearance- Not in acute distress.   Skin General: Color- Normal Color. Moisture- Normal Moisture.  Neck Carotid Arteries- Normal color. Moisture- Normal Moisture. No carotid bruits. No JVD.  Chest and Lung Exam Auscultation: Breath Sounds:-Normal.  Cardiovascular Auscultation:Rythm- Regular. Murmurs & Other Heart Sounds:Auscultation of the heart reveals- No Murmurs.  Abdomen Inspection:-Inspeection Normal. Palpation/Percussion:Note:No mass. Palpation and Percussion of the abdomen reveal- Non Tender, Non Distended + BS, no rebound or guarding.   Neurologic Cranial Nerve exam:- CN III-XII intact(No nystagmus), symmetric smile. Strength:- 5/5 equal and symmetric strength both upper and lower extremities.       Assessment & Plan:   Patient Instructions  For IPF continue Pirfenidone  801mg  three times daily.  For depression and anxiety continue wellbutrin, trazadone and start buspar.  Htn well controlled. Continue current bp med regimen.   Atrial fibrillation. Continue xarelto.  For restless legs continue gabapentin.  For laryngitis keep  appointment with ENT.  Follow up 3 months or sooner if needed.    Mackie Pai, [name]

## 2021-01-13 NOTE — Patient Instructions (Addendum)
For IPF continue Pirfenidone  801mg  three times daily.  For depression and anxiety continue wellbutrin, trazadone and start buspar.  Htn well controlled. Continue current bp med regimen.   Atrial fibrillation. Continue xarelto.  For restless legs continue gabapentin.  For laryngitis keep appointment with ENT.  Follow up 3 months or sooner if needed.

## 2021-01-14 ENCOUNTER — Encounter: Payer: Self-pay | Admitting: Medical

## 2021-01-14 DIAGNOSIS — G4733 Obstructive sleep apnea (adult) (pediatric): Secondary | ICD-10-CM | POA: Diagnosis not present

## 2021-01-15 ENCOUNTER — Emergency Department (HOSPITAL_BASED_OUTPATIENT_CLINIC_OR_DEPARTMENT_OTHER): Payer: Medicare Other

## 2021-01-15 ENCOUNTER — Other Ambulatory Visit: Payer: Self-pay

## 2021-01-15 ENCOUNTER — Inpatient Hospital Stay (HOSPITAL_BASED_OUTPATIENT_CLINIC_OR_DEPARTMENT_OTHER)
Admission: EM | Admit: 2021-01-15 | Discharge: 2021-01-19 | DRG: 854 | Disposition: A | Discharge status: Home / Self Care | Payer: Medicare Other | Attending: Internal Medicine | Admitting: Internal Medicine

## 2021-01-15 ENCOUNTER — Ambulatory Visit: Payer: Medicare Other | Admitting: Family

## 2021-01-15 ENCOUNTER — Inpatient Hospital Stay (HOSPITAL_COMMUNITY): Payer: Medicare Other

## 2021-01-15 ENCOUNTER — Encounter (HOSPITAL_BASED_OUTPATIENT_CLINIC_OR_DEPARTMENT_OTHER): Payer: Self-pay | Admitting: *Deleted

## 2021-01-15 DIAGNOSIS — Z951 Presence of aortocoronary bypass graft: Secondary | ICD-10-CM | POA: Diagnosis not present

## 2021-01-15 DIAGNOSIS — I480 Paroxysmal atrial fibrillation: Secondary | ICD-10-CM | POA: Diagnosis not present

## 2021-01-15 DIAGNOSIS — F17200 Nicotine dependence, unspecified, uncomplicated: Secondary | ICD-10-CM | POA: Diagnosis present

## 2021-01-15 DIAGNOSIS — F419 Anxiety disorder, unspecified: Secondary | ICD-10-CM | POA: Diagnosis not present

## 2021-01-15 DIAGNOSIS — K3532 Acute appendicitis with perforation and localized peritonitis, without abscess: Secondary | ICD-10-CM | POA: Diagnosis not present

## 2021-01-15 DIAGNOSIS — K35891 Other acute appendicitis without perforation, with gangrene: Secondary | ICD-10-CM | POA: Diagnosis present

## 2021-01-15 DIAGNOSIS — I2510 Atherosclerotic heart disease of native coronary artery without angina pectoris: Secondary | ICD-10-CM | POA: Diagnosis not present

## 2021-01-15 DIAGNOSIS — E7800 Pure hypercholesterolemia, unspecified: Secondary | ICD-10-CM | POA: Diagnosis not present

## 2021-01-15 DIAGNOSIS — F1010 Alcohol abuse, uncomplicated: Secondary | ICD-10-CM | POA: Diagnosis not present

## 2021-01-15 DIAGNOSIS — A419 Sepsis, unspecified organism: Principal | ICD-10-CM | POA: Diagnosis present

## 2021-01-15 DIAGNOSIS — Z886 Allergy status to analgesic agent status: Secondary | ICD-10-CM

## 2021-01-15 DIAGNOSIS — E785 Hyperlipidemia, unspecified: Secondary | ICD-10-CM | POA: Diagnosis not present

## 2021-01-15 DIAGNOSIS — Z955 Presence of coronary angioplasty implant and graft: Secondary | ICD-10-CM

## 2021-01-15 DIAGNOSIS — R1031 Right lower quadrant pain: Secondary | ICD-10-CM | POA: Diagnosis not present

## 2021-01-15 DIAGNOSIS — Z7901 Long term (current) use of anticoagulants: Secondary | ICD-10-CM

## 2021-01-15 DIAGNOSIS — K219 Gastro-esophageal reflux disease without esophagitis: Secondary | ICD-10-CM | POA: Diagnosis not present

## 2021-01-15 DIAGNOSIS — G4733 Obstructive sleep apnea (adult) (pediatric): Secondary | ICD-10-CM | POA: Diagnosis not present

## 2021-01-15 DIAGNOSIS — I10 Essential (primary) hypertension: Secondary | ICD-10-CM | POA: Diagnosis not present

## 2021-01-15 DIAGNOSIS — K353 Acute appendicitis with localized peritonitis, without perforation or gangrene: Secondary | ICD-10-CM | POA: Diagnosis not present

## 2021-01-15 DIAGNOSIS — K3580 Unspecified acute appendicitis: Secondary | ICD-10-CM | POA: Diagnosis not present

## 2021-01-15 DIAGNOSIS — Z87891 Personal history of nicotine dependence: Secondary | ICD-10-CM | POA: Diagnosis not present

## 2021-01-15 DIAGNOSIS — K37 Unspecified appendicitis: Secondary | ICD-10-CM | POA: Diagnosis not present

## 2021-01-15 DIAGNOSIS — Z20822 Contact with and (suspected) exposure to covid-19: Secondary | ICD-10-CM | POA: Diagnosis present

## 2021-01-15 DIAGNOSIS — R339 Retention of urine, unspecified: Secondary | ICD-10-CM | POA: Diagnosis not present

## 2021-01-15 DIAGNOSIS — R490 Dysphonia: Secondary | ICD-10-CM | POA: Diagnosis not present

## 2021-01-15 DIAGNOSIS — J432 Centrilobular emphysema: Secondary | ICD-10-CM

## 2021-01-15 DIAGNOSIS — J84112 Idiopathic pulmonary fibrosis: Secondary | ICD-10-CM | POA: Diagnosis not present

## 2021-01-15 DIAGNOSIS — F32A Depression, unspecified: Secondary | ICD-10-CM | POA: Diagnosis not present

## 2021-01-15 DIAGNOSIS — I252 Old myocardial infarction: Secondary | ICD-10-CM

## 2021-01-15 DIAGNOSIS — Z01811 Encounter for preprocedural respiratory examination: Secondary | ICD-10-CM

## 2021-01-15 DIAGNOSIS — Z79899 Other long term (current) drug therapy: Secondary | ICD-10-CM | POA: Diagnosis not present

## 2021-01-15 DIAGNOSIS — J449 Chronic obstructive pulmonary disease, unspecified: Secondary | ICD-10-CM | POA: Diagnosis not present

## 2021-01-15 DIAGNOSIS — Z01818 Encounter for other preprocedural examination: Secondary | ICD-10-CM | POA: Diagnosis not present

## 2021-01-15 DIAGNOSIS — I4891 Unspecified atrial fibrillation: Secondary | ICD-10-CM | POA: Diagnosis present

## 2021-01-15 LAB — URINALYSIS, ROUTINE W REFLEX MICROSCOPIC
Glucose, UA: NEGATIVE mg/dL
Hgb urine dipstick: NEGATIVE
Ketones, ur: NEGATIVE mg/dL
Leukocytes,Ua: NEGATIVE
Nitrite: NEGATIVE
Protein, ur: NEGATIVE mg/dL
Specific Gravity, Urine: 1.025 (ref 1.005–1.030)
pH: 6 (ref 5.0–8.0)

## 2021-01-15 LAB — RESP PANEL BY RT-PCR (FLU A&B, COVID) ARPGX2
Influenza A by PCR: NEGATIVE
Influenza B by PCR: NEGATIVE
SARS Coronavirus 2 by RT PCR: NEGATIVE

## 2021-01-15 LAB — LIPASE, BLOOD: Lipase: 50 U/L (ref 11–51)

## 2021-01-15 LAB — COMPREHENSIVE METABOLIC PANEL
ALT: 21 U/L (ref 0–44)
AST: 18 U/L (ref 15–41)
Albumin: 4 g/dL (ref 3.5–5.0)
Alkaline Phosphatase: 68 U/L (ref 38–126)
Anion gap: 10 (ref 5–15)
BUN: 12 mg/dL (ref 8–23)
CO2: 26 mmol/L (ref 22–32)
Calcium: 9.4 mg/dL (ref 8.9–10.3)
Chloride: 97 mmol/L — ABNORMAL LOW (ref 98–111)
Creatinine, Ser: 0.94 mg/dL (ref 0.61–1.24)
GFR, Estimated: >60 mL/min (ref >60)
Glucose, Bld: 87 mg/dL (ref 70–99)
Potassium: 4.1 mmol/L (ref 3.5–5.1)
Sodium: 133 mmol/L — ABNORMAL LOW (ref 135–145)
Total Bilirubin: 0.6 mg/dL (ref 0.3–1.2)
Total Protein: 7.5 g/dL (ref 6.5–8.1)

## 2021-01-15 LAB — CBC
HCT: 46.4 % (ref 39.0–52.0)
Hemoglobin: 15.7 g/dL (ref 13.0–17.0)
MCH: 32.8 pg (ref 26.0–34.0)
MCHC: 33.8 g/dL (ref 30.0–36.0)
MCV: 97.1 fL (ref 80.0–100.0)
Platelets: 212 10*3/uL (ref 150–400)
RBC: 4.78 MIL/uL (ref 4.22–5.81)
RDW: 13.4 % (ref 11.5–15.5)
WBC: 16.2 10*3/uL — ABNORMAL HIGH (ref 4.0–10.5)
nRBC: 0 % (ref 0.0–0.2)

## 2021-01-15 MED ORDER — METRONIDAZOLE 500 MG/100ML IV SOLN
500.0000 mg | Freq: Once | INTRAVENOUS | Status: AC
  Administered 2021-01-15: 500 mg via INTRAVENOUS
  Filled 2021-01-15: qty 100

## 2021-01-15 MED ORDER — HYDROMORPHONE HCL 1 MG/ML IJ SOLN
0.5000 mg | INTRAMUSCULAR | Status: DC | PRN
  Administered 2021-01-15: 0.5 mg via INTRAVENOUS
  Filled 2021-01-15: qty 1

## 2021-01-15 MED ORDER — ONDANSETRON 4 MG PO TBDP
4.0000 mg | ORAL_TABLET | Freq: Once | ORAL | Status: AC | PRN
  Administered 2021-01-15: 4 mg via ORAL
  Filled 2021-01-15: qty 1

## 2021-01-15 MED ORDER — HYDROMORPHONE HCL 1 MG/ML IJ SOLN
0.5000 mg | Freq: Once | INTRAMUSCULAR | Status: AC
  Administered 2021-01-15: 0.5 mg via INTRAVENOUS
  Filled 2021-01-15: qty 1

## 2021-01-15 MED ORDER — SODIUM CHLORIDE 0.9 % IV SOLN: INTRAVENOUS | Status: DC | PRN

## 2021-01-15 MED ORDER — PIRFENIDONE 801 MG PO TABS: 801.0000 mg | ORAL_TABLET | Freq: Three times a day (TID) | ORAL | Status: DC

## 2021-01-15 MED ORDER — FAMOTIDINE IN NACL 20-0.9 MG/50ML-% IV SOLN
20.0000 mg | Freq: Once | INTRAVENOUS | Status: AC
  Administered 2021-01-15: 20 mg via INTRAVENOUS
  Filled 2021-01-15: qty 50

## 2021-01-15 MED ORDER — FENTANYL CITRATE PF 50 MCG/ML IJ SOSY
25.0000 ug | PREFILLED_SYRINGE | Freq: Once | INTRAMUSCULAR | Status: AC
  Administered 2021-01-15: 25 ug via INTRAVENOUS
  Filled 2021-01-15: qty 1

## 2021-01-15 MED ORDER — HYDROMORPHONE HCL 1 MG/ML IJ SOLN
1.0000 mg | Freq: Once | INTRAMUSCULAR | Status: AC
  Administered 2021-01-15: 1 mg via INTRAVENOUS
  Filled 2021-01-15: qty 1

## 2021-01-15 MED ORDER — IOHEXOL 300 MG/ML  SOLN
100.0000 mL | Freq: Once | INTRAMUSCULAR | Status: AC | PRN
  Administered 2021-01-15: 100 mL via INTRAVENOUS

## 2021-01-15 MED ORDER — SODIUM CHLORIDE 0.9 % IV SOLN
2.0000 g | Freq: Once | INTRAVENOUS | Status: AC
  Administered 2021-01-15: 2 g via INTRAVENOUS
  Filled 2021-01-15: qty 20

## 2021-01-15 MED ORDER — HYDROXYZINE HCL 25 MG PO TABS: 25.0000 mg | ORAL_TABLET | Freq: Three times a day (TID) | ORAL | Status: DC | PRN

## 2021-01-15 MED ORDER — SODIUM CHLORIDE 0.9 % IV BOLUS
1000.0000 mL | Freq: Once | INTRAVENOUS | Status: AC
  Administered 2021-01-15: 1000 mL via INTRAVENOUS

## 2021-01-15 NOTE — H&P (Signed)
Robert Lang UDJ:[id number] DOB: [birth year] DOA: 01/15/2021   PCP: Mackie Pai, [name]   Outpatient Specialists:  CARDS:   Dr. Agustin Cree   Pulmonary  [place]    Patient arrived to ER on 01/15/21 at 1227 Referred by Attending Long, Wonda Olds, MD   Patient coming from: home Lives  With family    Chief Complaint:   Chief Complaint  Patient presents with   Abdominal Pain    HPI: Robert Lang is a 70 y.o. male with medical history significant of COPD, IPF, CAD  A. fib, alcohol abuse, CAD, GERD, HLD  Presented with   nausea chills since last night, reports chills, started at 2 Am hurting around belly button and later it moved to right side Could not go to the bathroom this AM  It hurts to move No CP Reports some nausea Anxiety He drinks 8oz of wine per day He feels more anxious without the wine reports he has some shaking if he does not drink  Quit smoking 2020   Has  been vaccinated against COVID  and boosted   Initial COVID TEST  NEGATIVE    Lab Results  Component Value Date   [facility] 01/15/2021   [place] NEGATIVE 06/13/2020   [place] NEGATIVE 01/10/2019     Regarding pertinent Chronic problems:    Hyperlipidemia - on statins lipitor Lipid Panel     Component Value Date/Time   CHOL 138 10/13/2020 0923   CHOL 128 04/03/2020 0951   TRIG 380.0 (H) 10/13/2020 0923   HDL 28.60 (L) 10/13/2020 0923   HDL 40 04/03/2020 0951   CHOLHDL 5 10/13/2020 0923   VLDL 76.0 (H) 10/13/2020 0923   LDLCALC 58 04/03/2020 0951   LDLDIRECT 66.0 10/13/2020 0923   LABVLDL 30 04/03/2020 0951   IPF - Pirfenidone   HTN on enalapril   CAD  - On  statin,  Renexa                -  followed by cardiology     COPD - not  followed by pulmonology    OSA -on CPAP  A. Fib -  - CHA2DS2 vas score  3     current  on anticoagulation with Xarelto,       While in ER: CT abd: . Findings concerning for early acute tip appendicitis. No perforation or  abscess.  Pt transferred to [facility] ER FOR gen Surgery consult    ED Triage Vitals  Enc Vitals Group     BP 01/15/21 1241 106/64     Pulse Rate 01/15/21 1241 (!) 102     Resp 01/15/21 1241 20     Temp 01/15/21 1241 97.6 F (36.4 C)     Temp Source 01/15/21 1241 Oral     SpO2 01/15/21 1241 96 %     Weight 01/15/21 1235 188 lb 7.9 oz (85.5 kg)     Height 01/15/21 1235 5' 11"  (1.803 m)     Head Circumference --      Peak Flow --      Pain Score 01/15/21 1234 8     Pain Loc --      Pain Edu? --      Excl. in [place]? --   TMAX(24)@     _________________________________________ Significant initial  Findings: Abnormal Labs Reviewed  COMPREHENSIVE METABOLIC PANEL - Abnormal; Notable for the following components:      Result Value   Sodium 133 (*)    Chloride 97 (*)  All other components within normal limits  CBC - Abnormal; Notable for the following components:   WBC 16.2 (*)    All other components within normal limits  URINALYSIS, ROUTINE W REFLEX MICROSCOPIC - Abnormal; Notable for the following components:   Bilirubin Urine SMALL (*)    All other components within normal limits   ____________________________________________ Ordered    CXR -Left basilar atelectasis/airspace disease.  CTabd/pelvis - 1. Findings concerning for early acute tip appendicitis. No perforation or abscess. 2. Colonic diverticulosis without evidence for acute diverticulitis. 3. Small hypodense lesion in the liver is indeterminate. Recommend further evaluation with MRI.      ECG: Ordered  This patient meets SIRS Criteria and may be septic.    The recent clinical data is shown below. Vitals:   01/15/21 1930 01/15/21 1936 01/15/21 1956 01/15/21 1957  BP: 114/68   110/75  Pulse: (!) 102   (!) 102  Resp:      Temp:      TempSrc:      SpO2:  92% 93% 92%  Weight:      Height:        WBC     Component Value Date/Time   WBC 16.2 (H) 01/15/2021 1255   LYMPHSABS 0.8 08/12/2020 0952   MONOABS 1.4  (H) 08/12/2020 0952   EOSABS 0.2 08/12/2020 0952   BASOSABS 0.1 08/12/2020 0952       UA   no evidence of UTI      Urine analysis:    Component Value Date/Time   COLORURINE YELLOW 01/15/2021 [place] 01/15/2021 1255   LABSPEC 1.025 01/15/2021 1255   PHURINE 6.0 01/15/2021 [place] 01/15/2021 1255   [employer] 01/15/2021 1255   BILIRUBINUR SMALL (A) 01/15/2021 1255   BILIRUBINUR Negative 09/06/2018 1023   KETONESUR NEGATIVE 01/15/2021 1255   PROTEINUR NEGATIVE 01/15/2021 1255   UROBILINOGEN negative (A) 09/06/2018 1023   NITRITE NEGATIVE 01/15/2021 1255   LEUKOCYTESUR NEGATIVE 01/15/2021 1255    Results for orders placed or performed during the hospital encounter of 01/15/21  Resp Panel by RT-PCR (Flu A&B, Covid) Nasopharyngeal Swab     Status: None   Collection Time: 01/15/21  6:46 PM   Specimen: Nasopharyngeal Swab; Nasopharyngeal(NP) swabs in vial transport medium  Result Value Ref Range Status   SARS Coronavirus 2 by RT PCR NEGATIVE NEGATIVE Final         Influenza A by PCR NEGATIVE NEGATIVE Final   Influenza B by PCR NEGATIVE NEGATIVE Final          _______________________________________________________ ER Provider Called:    gen Surgery  Dr.Allen They Recommend admit to medicine    SEEN in ER _______________________________________________ Hospitalist was called for admission for  Appedicitis  The following Work up has been ordered so far:  Orders Placed This Encounter  Procedures   Resp Panel by RT-PCR (Flu A&B, Covid) Nasopharyngeal Swab   CT ABDOMEN PELVIS W CONTRAST   Lipase, blood   Comprehensive metabolic panel   CBC   Urinalysis, Routine w reflex microscopic   Diet NPO time specified   Consult to general surgery   Consult to hospitalist     Following Medications were ordered in ER: Medications  0.9 %  sodium chloride infusion ( Intravenous New Bag/Given 01/15/21 1847)  HYDROmorphone (DILAUDID) injection 0.5  mg (has no administration in time range)  ondansetron (ZOFRAN-ODT) disintegrating tablet 4 mg (4 mg Oral Given 01/15/21 1259)  fentaNYL (SUBLIMAZE) injection  25 mcg (25 mcg Intravenous Given 01/15/21 1635)  famotidine (PEPCID) IVPB 20 mg premix (0 mg Intravenous Stopped 01/15/21 1708)  HYDROmorphone (DILAUDID) injection 0.5 mg (0.5 mg Intravenous Given 01/15/21 1719)  iohexol (OMNIPAQUE) 300 MG/ML solution 100 mL (100 mLs Intravenous Contrast Given 01/15/21 1729)  HYDROmorphone (DILAUDID) injection 1 mg (1 mg Intravenous Given 01/15/21 1843)  cefTRIAXone (ROCEPHIN) 2 g in sodium chloride 0.9 % 100 mL IVPB (0 g Intravenous Stopped 01/15/21 1918)    And  metroNIDAZOLE (FLAGYL) IVPB 500 mg (500 mg Intravenous New Bag/Given 01/15/21 1932)  sodium chloride 0.9 % bolus 1,000 mL (0 mLs Intravenous Stopped 01/15/21 2045)  HYDROmorphone (DILAUDID) injection 0.5 mg (0.5 mg Intravenous Given 01/15/21 1959)        Consult Orders  (From admission, onward)           Start     Ordered   01/15/21 2108  Consult to hospitalist  Once       Provider:  (Not yet assigned)  Question Answer Comment  Place call to: [facility]   Reason for Consult Admit      01/15/21 2107              OTHER Significant initial  Findings:  labs showing:    Recent Labs  Lab 01/15/21 1255  NA 133*  K 4.1  CO2 26  GLUCOSE 87  BUN 12  CREATININE 0.94  CALCIUM 9.4    Cr   stable  Lab Results  Component Value Date   CREATININE 0.94 01/15/2021   CREATININE 0.85 10/13/2020   CREATININE 0.76 08/12/2020    Recent Labs  Lab 01/15/21 1255  AST 18  ALT 21  ALKPHOS 68  BILITOT 0.6  PROT 7.5  ALBUMIN 4.0   Lab Results  Component Value Date   CALCIUM 9.4 01/15/2021          Plt: Lab Results  Component Value Date   PLT 212 01/15/2021        Recent Labs  Lab 01/15/21 1255  WBC 16.2*  HGB 15.7  HCT 46.4  MCV 97.1  PLT 212    HG/HCT  stable,       Component Value Date/Time    HGB 15.7 01/15/2021 1255   HCT 46.4 01/15/2021 1255   MCV 97.1 01/15/2021 1255      Recent Labs  Lab 01/15/21 1255  LIPASE 50     HbA1C: Recent Labs    01/25/20 1134  HGBA1C 5.1       CBG (last 3)  No results for input(s): GLUCAP in the last 72 hours.        Cultures: No results found for: SDES, [place], CULT, REPTSTATUS   Radiological Exams on Admission: CT ABDOMEN PELVIS W CONTRAST  Result Date: 01/15/2021 CLINICAL DATA:  Right lower quadrant pain. EXAM: CT ABDOMEN AND PELVIS WITH CONTRAST TECHNIQUE: Multidetector CT imaging of the abdomen and pelvis was performed using the standard protocol following bolus administration of intravenous contrast. CONTRAST:  14m OMNIPAQUE IOHEXOL 300 MG/ML  SOLN COMPARISON:  None. FINDINGS: Lower chest: There is atelectasis in the lung bases. Hepatobiliary: There is a hypodensity in the right lobe of the liver which is too small to characterize measuring 12 mm, possibly a complex and indeterminate. Gallbladder and bile ducts are within normal limits. Pancreas: Unremarkable. No pancreatic ductal dilatation or surrounding inflammatory changes. Spleen: Normal in size without focal abnormality. Adrenals/Urinary Tract: Adrenal glands are unremarkable. Kidneys are normal, without renal calculi, focal lesion,  or hydronephrosis. Bladder is unremarkable. Stomach/Bowel: The tip of the appendix is prominent in size measuring 9 mm in there is mild surrounding inflammatory stranding. Proximal appendix is within normal limits. No bowel obstruction or free air. There is colonic diverticulosis without evidence for diverticulitis. Small bowel and stomach are within normal limits. Vascular/Lymphatic: Aortic atherosclerosis. No enlarged abdominal or pelvic lymph nodes. Reproductive: Prostate is unremarkable. Other: No abdominal wall hernia or abnormality. No abdominopelvic ascites. Musculoskeletal: Multilevel degenerative changes affect the spine. Vertebroplasty  changes are seen at T12. Laminectomy defects are seen at L3 and L4. IMPRESSION: 1. Findings concerning for early acute tip appendicitis. No perforation or abscess. 2. Colonic diverticulosis without evidence for acute diverticulitis. 3. Small hypodense lesion in the liver is indeterminate. Recommend further evaluation with MRI. Electronically Signed   By: Ronney Asters M.D.   On: 01/15/2021 18:22   _______________________________________________________________________________________________________ Latest   Blood pressure 110/75, pulse (!) 102, temperature 97.6 F (36.4 C), resp. rate 20, height 5' 11"  (1.803 m), weight 85.5 kg, SpO2 92 %.   Review of Systems:    Pertinent positives include:   chills, fatigue,abdominal pain, nausea, vomiting,  Constitutional:  No weight loss, night sweats, Fevers, weight loss  HEENT:  No headaches, Difficulty swallowing,Tooth/dental problems,Sore throat,  No sneezing, itching, ear ache, nasal congestion, post nasal drip,  Cardio-vascular:  No chest pain, Orthopnea, PND, anasarca, dizziness, palpitations.no Bilateral lower extremity swelling  GI:  No heartburn, indigestion,  diarrhea, change in bowel habits, loss of appetite, melena, blood in stool, hematemesis Resp:  no shortness of breath at rest. No dyspnea on exertion, No excess mucus, no productive cough, No non-productive cough, No coughing up of blood.No change in color of mucus.No wheezing. Skin:  no rash or lesions. No jaundice GU:  no dysuria, change in color of urine, no urgency or frequency. No straining to urinate.  No flank pain.  Musculoskeletal:  No joint pain or no joint swelling. No decreased range of motion. No back pain.  Psych:  No change in mood or affect. No depression or anxiety. No memory loss.  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 M75.00)   Allergic rhinitis due to pollen 09/03/2013   J30.1)  Formatting of this note might be different from the original. J30.1)   Allergy    Anticoagulated 07/01/2016   Anxiety    Arthritis    Atrial fibrillation ([place])    Atrial flutter ([place]) 06/26/2015   CAD (coronary artery disease)    Chest pain 06/26/2015   COPD (chronic obstructive pulmonary disease) ([place])    Coronary artery disease 07/06/2018   Cardiac catheterization 2017 showing 90% small diagonal branch disease   DDD (degenerative disc disease), cervical 09/03/2013   M50.90)  Formatting of this note might be different from the original. M50.90)   Depression    Depression    Depressive disorder 09/03/2013   Dizziness 10/28/2017   Dyslipidemia, goal LDL below 70 07/06/2018   Dyspnea on exertion 10/20/2018   Esophageal reflux 09/03/2013   Essential hypertension 07/06/2018   ETOH abuse 01/11/2019   6 pack  of beer per day   Falls 10/28/2017   GERD (gastroesophageal reflux disease)    H/O amiodarone therapy 07/01/2016   Heart attack ([place])    Heart palpitations 01/27/2017   History of colon polyps    Hyperlipidemia    Hypertension    IPF (idiopathic pulmonary fibrosis) ([place])    Kidney stones    Neck pain 09/03/2013   Neuropathy 07/06/2018   Obstructive sleep apnea 08/05/2015   PLMD (periodic limb movement disorder) 11/04/2015   Polycythemia, 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 STORY: Due to alcohol/ tobacco   Pure hypercholesterolemia 09/03/2013   E78.0)   Formatting of this note might be different from the original. E78.0)   Screening for prostate cancer 09/03/2013   Smoker 01/11/2019   Smoking 07/06/2018   Status post ablation of atrial flutter 07/06/2018   2017      Past Surgical History:  Procedure Laterality Date   ATRIAL FIBRILLATION ABLATION  10/2015   BACK SURGERY     CARDIOVERSION  2017   CATARACT EXTRACTION Bilateral 2017   March and April 2017   COLONOSCOPY  2018   CORONARY STENT PLACEMENT  2012   CORONARY/GRAFT ACUTE MI REVASCULARIZATION N/A 01/10/2019   Procedure: CORONARY/GRAFT ACUTE MI REVASCULARIZATION;  Surgeon: Leonie Man, MD;  Location: [place];  Service: Cardiovascular;  Laterality: N/A;   INGUINAL HERNIA REPAIR     over 20 years ago   LEFT HEART CATH AND CORONARY ANGIOGRAPHY N/A 01/10/2019   Procedure: LEFT HEART CATH AND CORONARY ANGIOGRAPHY;  Surgeon: Leonie Man, MD;  Location: [facility];  Service: Cardiovascular;  Laterality: N/A;   LUMBAR LAMINECTOMY Bilateral 04/05/2016   L2-L5    VENTRAL HERNIA REPAIR  2018    Social History:  Ambulatory   independently     reports that he quit smoking about 3 years ago. His smoking use included cigarettes. He has a 30.00 pack-year smoking history. He has quit using smokeless tobacco. He reports current alcohol use. He reports that he does not use drugs.    Family History:   Family History  Problem Relation Age of Onset   Colon cancer Father    Throat cancer Brother    ______________________________________________________________________________________________ Allergies: Allergies  Allergen Reactions   Naproxen Other (See Comments)    (Naprosyn *ANALGESICS - ANTI-INFLAMMATORY*) Nausea, Abdominal pain     Prior to Admission medications   Medication Sig Start Date End Date Taking? Authorizing Provider  sertraline (ZOLOFT) 100 MG tablet Take 200 mg by mouth daily.   Yes [provider]  atorvastatin (LIPITOR) 80 MG  tablet TAKE 1 TABLET(80 MG) BY MOUTH DAILY AT 6 PM 11/10/20   Park Liter, MD  buPROPion (WELLBUTRIN XL) 300 MG 24 hr tablet Take 1 tablet (300 mg total) by mouth daily. 07/09/19   [name], Percell Miller, [name]  busPIRone (BUSPAR) 7.5 MG tablet Take 1 tablet (7.5 mg total) by mouth 2 (two) times daily. 01/13/21   [name], Percell Miller, [name]  Cholecalciferol 25 MCG (1000 UT) tablet Take 1,000 Units by mouth daily.     [provider]  docusate sodium (COLACE) 50 MG capsule Take 50 mg by mouth at bedtime.    [provider]  enalapril (VASOTEC) 2.5 MG tablet Take 2.5 mg by mouth daily. 09/26/20   [provider]  famotidine (PEPCID) 40 MG tablet TAKE 1 TABLET(40 MG) BY MOUTH DAILY 02/27/20  Park Liter, MD  fexofenadine (ALLEGRA) 180 MG tablet Take 180 mg by mouth at bedtime.  04/09/08   [provider]  finasteride (PROSCAR) 5 MG tablet Take 1 tablet (5 mg total) by mouth daily. 04/30/20   [name], Percell Miller, [name]  gabapentin (NEURONTIN) 100 MG capsule 4 tab po q hs. 11/10/20   [name], Percell Miller, [name]  Ginger, Zingiber officinalis, (GINGER ROOT) 500 MG CAPS Take 500 mg by mouth.    [provider]  Multiple Vitamins-Minerals (PRESERVISION AREDS PO) Take 2 capsules by mouth in the morning and at bedtime.    [provider]  nitroGLYCERIN (NITROSTAT) 0.4 MG SL tablet Place 1 tablet (0.4 mg total) under the tongue every 5 (five) minutes as needed for chest pain. 10/11/18   Park Liter, MD  pantoprazole (PROTONIX) 40 MG tablet Take 40 mg by mouth daily. 10/31/20   [provider]  Pirfenidone (ESBRIET) 801 MG TABS Take 801 mg by mouth 3 (three) times daily. 01/12/21   Brand Males, MD  Polyethylene Glycol 3350 (MIRALAX PO) Take by mouth at bedtime.    [provider]  ranolazine (RANEXA) 1000 MG SR tablet Take 1 tablet (1,000 mg total) by mouth 2 (two) times daily. 04/30/20   [name], Percell Miller, [name]  solifenacin (VESICARE) 5 MG tablet  Take 5 mg by mouth daily.    [provider]  traZODone (DESYREL) 50 MG tablet TAKE 1/2 TO 1 TABLET(25 TO 50 MG) BY MOUTH AT BEDTIME AS NEEDED FOR SLEEP 01/05/21   [name], Percell Miller, [name]  Turmeric 500 MG CAPS Take 1 capsule by mouth 2 (two) times daily.     [provider]  vitamin B-12 (CYANOCOBALAMIN) 1000 MCG tablet Take 1,000 mcg by mouth daily.    [provider]  XARELTO 20 MG TABS tablet TAKE 1 TABLET(20 MG) BY MOUTH DAILY WITH SUPPER 10/20/20   Park Liter, MD    ___________________________________________________________________________________________________ Physical Exam: Vitals with BMI 01/15/2021 01/15/2021 01/15/2021  Height - - -  Weight - - -  BMI - - -  Systolic 269 485 462  Diastolic 75 68 74  Pulse 703 102 107     1. General:  in   Acute distress increased work of breathing  complaining of severe pain   Chronically ill   -appearing 2. Psychological: Alert and   Oriented 3. Head/ENT:  Dry Mucous Membranes                          Head Non traumatic, neck supple                           Poor Dentition 4. SKIN:  decreased Skin turgor,  Skin clean Dry and intact no rash 5. Heart: Regular rate and rhythm no  Murmur, no Rub or gallop 6. Lungs:  no wheezes some  crackles   7. Abdomen: Soft,RLQ tender, Non distended   obese  bowel sounds diminished 8. Lower extremities: no clubbing, cyanosis, no  edema 9. Neurologically Grossly intact, moving all 4 extremities equally   10. MSK: Normal range of motion    Chart has been reviewed  ______________________________________________________________________________________________  Assessment/Plan 70 y.o. male with medical history significant of COPD, IPF, CAD  A. fib, alcohol abuse, CAD, GERD, HLD  Admitted for appendicitis  Present on Admission:  Appendicitis -appreciate general surgery consult at this point patient is on IV antibiotics Given significant underlying medical  disease  would be a difficult candidate for operative intervention. Appreciate general surgery consult if operative intervention needed will likely benefit from cardiology/pulmonology clearance N.p.o. except for sips of water for meds Pain control IV  SEPSIS -  -SIRS criteria met with  elevated white blood cell count,       Component Value Date/Time   WBC 16.2 (H) 01/15/2021 1255   LYMPHSABS 0.8 08/12/2020 0952     tachycardia   ,  RR >20 Today's Vitals   01/15/21 1957 01/15/21 2048 01/15/21 2230 01/15/21 2255  BP: 110/75  116/66   Pulse: (!) 102  99   Resp:      Temp:      TempSrc:      SpO2: 92%  92%   Weight:      Height:      PainSc:  7   5      -Most likely source being:  intra-abdominal,        - Obtain serial lactic acid and procalcitonin level.  - Initiated IV antibiotics in ER: Antibiotics Given (last 72 hours)     Date/Time Action Medication Dose Rate   01/15/21 1848 New Bag/Given   cefTRIAXone (ROCEPHIN) 2 g in sodium chloride 0.9 % 100 mL IVPB 2 g 200 mL/hr   01/15/21 1932 New Bag/Given   metroNIDAZOLE (FLAGYL) IVPB 500 mg 500 mg 100 mL/hr        Will continue     - await results of blood and urine culture  - Rehydrate   Intravenous fluids were administered,      11:28 PM   Smoking tobacco abuse-in remission   Alcohol abuse -order CIWA protocol   Essential hypertension -allow permissive hypertension    Dyslipidemia, goal LDL below 70 continue Lipitor when able to told .  Coronary artery disease -continue Lipitor unable to tolerate continue Ranexa   COPD (chronic obstructive pulmonary disease) (HCC) -chronic stable    Atrial fibrillation (HCC) -hold Xarelto for now given possible need for operative intervention   IPF (idiopathic pulmonary fibrosis) ([place]) -continue home medication   OSA (obstructive sleep apnea) -continue CPAP  Hoarseness patient will follow-up as an outpatient with ENT Other plan as per orders.  DVT prophylaxis:  SCD       Code Status:    Code Status: Prior FULL CODE  per patient   I had personally discussed CODE STATUS with patient and family     Family Communication:   Family  at  Bedside  plan of care was discussed  with  Wife,    Disposition Plan:  To home once workup is complete and patient is stable   Following barriers for discharge:                            Electrolytes corrected                                                            Pain controlled with PO medications                               Afebrile, white count improving able to transition to PO antibiotics  Will need to be able to tolerate PO                            Will likely need home health, home O2, set up                           Will need consultants to evaluate patient prior to discharge                      Consults called: general surgery   Admission status:  ED Disposition     ED Disposition  Admit   Condition  --   Comment  The patient appears reasonably stabilized for admission considering the current resources, flow, and capabilities available in the ED at this time, and I doubt any other [facility] requiring further screening and/or treatment in the ED prior to admission is  present.           inpatient     I Expect 2 midnight stay secondary to severity of patient's current illness need for inpatient interventions justified by the following:  hemodynamic instability despite optimal treatment (tachycardia  tachypnea  hypercapnia)   Severe lab/radiological/exam abnormalities including:    apenidicitis and extensive comorbidities including:  substance abuse     COPD/asthma   Chronic anticoagulation  That are currently affecting medical management.   I expect  patient to be hospitalized for 2 midnights requiring inpatient medical care.  Patient is at high risk for adverse outcome (such as loss of life or disability) if not treated.  Indication for inpatient stay as  follows:    severe pain requiring acute inpatient management,  inability to maintain oral hydration    Need for operative/procedural  intervention    Need for IV antibiotics, IV fluids,  IV pain medications,      Level of care     tele  For 12H     Lab Results  Component Value Date   [place] 01/15/2021     Precautions: admitted as   Covid Negative   PPE: Used by the provider:   N95  eye Goggles,  Gloves      Lillianne Eick 01/15/2021, 11:21 PM     [facility]     after 2 AM please page floor coverage PA If 7AM-7PM, please contact the day team taking care of the patient using [email or web address]   Patient was evaluated in the context of the global COVID-19 pandemic, which necessitated consideration that the patient might be at risk for infection with the SARS-CoV-2 virus that causes COVID-19. Institutional protocols and algorithms that pertain to the evaluation of patients at risk for COVID-19 are in a state of rapid change based on information released by regulatory bodies including the CDC and federal and state organizations. These policies and algorithms were followed during the patient's care.

## 2021-01-15 NOTE — ED Notes (Signed)
Pt received from [facility] to Rm 22.

## 2021-01-15 NOTE — ED Triage Notes (Addendum)
Abdominal pain woke him this am. Nausea. Chills last night. States he has been constipated.

## 2021-01-15 NOTE — ED Provider Notes (Signed)
Blood pressure 110/75, pulse (!) 102, temperature 97.6 F (36.4 C), resp. rate 20, height 5\' 11"  (1.803 m), weight 85.5 kg, SpO2 92 %.  In short, Robert Lang is a 70 y.o. male with a chief complaint of Abdominal Pain .  Refer to the original H&P for additional details.  08:45 PM  Spoke with Dr. Zenia Lang with surgery. Will be down to evaluate in the ED.   Spoke with Dr. Zenia Lang. Plan for medical mgmt with significant medical co-morbidity will follow exam with abx. No not plan on urgent surgery. Will discuss with [facility] regarding med admit.   Discussed patient's case with [facility] to request admission. Patient and family (if present) updated with plan. Care transferred to [facility] service.  I reviewed all nursing notes, vitals, pertinent old records, EKGs, labs, imaging (as available).     Robert Fast, MD 01/15/21 [id number]

## 2021-01-15 NOTE — Consult Note (Signed)
Robert Lang 08/15/1950  [id number].    Requesting MD: Delia Heady Chief Complaint/Reason for Consult: Appendicitis  HPI:  Robert Lang is a 70 yo male with a history of STEMI (2020), OSA, ILD, and a-fib (on Xarelto) who presented to the ED today with abdominal pain.  Says the pain began in the lower abdomen around 2 AM and is now focused in the right lower quadrant.  He reports chills and nausea but no vomiting.  He presented to the ED and labs were significant for a WBC of 16. A CT scan showed mild dilation and stranding of the distal appendix, suspicious for early appendicitis.  His prior abdominal surgeries include bilateral inguinal hernia repairs and a ventral hernia repair with mesh. His last dose of Xarelto was this morning. He uses a CPAP at night, and reports he has dyspnea after ambulating short distances.  ROS: Review of Systems  Constitutional:  Positive for chills. Negative for fever.  Eyes:  Negative for redness.  Respiratory:  Positive for shortness of breath. Negative for stridor.   Cardiovascular:  Negative for chest pain.  Gastrointestinal:  Positive for abdominal pain and nausea. Negative for vomiting.  Neurological:  Negative for seizures and loss of consciousness.   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 M75.00)   Allergic rhinitis due to pollen 09/03/2013   J30.1)  Formatting of this note might be different from the original. J30.1)   Allergy    Anticoagulated 07/01/2016   Anxiety    Arthritis    Atrial fibrillation ([place])    Atrial flutter ([place]) 06/26/2015   CAD (coronary artery disease)    Chest pain 06/26/2015   COPD (chronic obstructive  pulmonary disease) ([place])    Coronary artery disease 07/06/2018   Cardiac catheterization 2017 showing 90% small diagonal branch disease   DDD (degenerative disc disease), cervical 09/03/2013   M50.90)  Formatting of this note might be different from the original. M50.90)   Depression    Depression    Depressive disorder 09/03/2013   Dizziness 10/28/2017   Dyslipidemia, goal LDL below 70 07/06/2018   Dyspnea on exertion 10/20/2018   Esophageal reflux 09/03/2013   Essential hypertension 07/06/2018   ETOH abuse 01/11/2019   6 pack of beer per day   Falls 10/28/2017   GERD (gastroesophageal reflux disease)    H/O amiodarone therapy 07/01/2016   Heart attack ([place])    Heart palpitations 01/27/2017   History of colon polyps    Hyperlipidemia    Hypertension    IPF (idiopathic pulmonary fibrosis) ([place])    Kidney stones    Neck pain 09/03/2013   Neuropathy 07/06/2018   Obstructive sleep apnea 08/05/2015   PLMD (periodic limb movement disorder) 11/04/2015   Polycythemia, 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 STORY: Due to alcohol/ tobacco   Pure hypercholesterolemia 09/03/2013   E78.0)  Formatting of this note might be different from the original. E78.0)   Screening for prostate cancer 09/03/2013   Smoker 01/11/2019   Smoking 07/06/2018   Status post ablation of atrial flutter 07/06/2018   2017  Past Surgical History:  Procedure Laterality Date   ATRIAL FIBRILLATION ABLATION  10/2015   BACK SURGERY     CARDIOVERSION  2017   CATARACT EXTRACTION Bilateral 2017   March and April 2017   COLONOSCOPY  2018   CORONARY STENT PLACEMENT  2012   CORONARY/GRAFT ACUTE MI REVASCULARIZATION N/A 01/10/2019   Procedure: CORONARY/GRAFT ACUTE MI REVASCULARIZATION;  Surgeon: Leonie Man, MD;  Location: [facility];  Service: Cardiovascular;  Laterality: N/A;   INGUINAL HERNIA REPAIR     over 20 years  ago   LEFT HEART CATH AND CORONARY ANGIOGRAPHY N/A 01/10/2019   Procedure: LEFT HEART CATH AND CORONARY ANGIOGRAPHY;  Surgeon: Leonie Man, MD;  Location: [facility];  Service: Cardiovascular;  Laterality: N/A;   LUMBAR LAMINECTOMY Bilateral 04/05/2016   L2-L5    VENTRAL HERNIA REPAIR  2018    Social History:  reports that he quit smoking about 3 years ago. His smoking use included cigarettes. He has a 30.00 pack-year smoking history. He has quit using smokeless tobacco. He reports current alcohol use. He reports that he does not use drugs.  Allergies:  Allergies  Allergen Reactions   Naproxen Other (See Comments)    (Naprosyn *ANALGESICS - ANTI-INFLAMMATORY*) Nausea, Abdominal pain    (Not in a hospital admission)    Physical Exam: Blood pressure 110/75, pulse (!) 102, temperature 97.6 F (36.4 C), resp. rate 20, height 5\' 11"  (1.803 m), weight 85.5 kg, SpO2 92 %. General: resting comfortably, appears stated age, no apparent distress Neurological: alert and oriented, no focal deficits, cranial nerves grossly in tact HEENT: normocephalic, atraumatic, oropharynx clear, no scleral icterus CV: regular rate and rhythm, extremities warm and well-perfused Respiratory: Mildly increased work of breathing on room air, symmetric chest wall expansion Abdomen: soft, nondistended, tender to palpation in the right lower quadrant.  Well-healed surgical scar in the upper midline. Extremities: warm and well-perfused, no deformities, moving all extremities spontaneously Psychiatric: normal mood and affect Skin: warm and dry, no jaundice, no rashes or lesions   Results for orders placed or performed during the hospital encounter of 01/15/21 (from the past 48 hour(s))  Lipase, blood     Status: None   Collection Time: 01/15/21 12:55 PM  Result Value Ref Range   Lipase 50 11 - 51 U/L    Comment: Performed at [facility], [address] [place], [place] [address]   Comprehensive metabolic panel     Status: Abnormal   Collection Time: 01/15/21 12:55 PM  Result Value Ref Range   Sodium 133 (L) 135 - 145 mmol/L   Potassium 4.1 3.5 - 5.1 mmol/L   Chloride 97 (L) 98 - 111 mmol/L   CO2 26 22 - 32 mmol/L   Glucose, Bld 87 70 - 99 mg/dL    Comment: Glucose reference range applies only to samples taken after fasting for at least 8 hours.   BUN 12 8 - 23 mg/dL   Creatinine, Ser 0.94 0.61 - 1.24 mg/dL   Calcium 9.4 8.9 - 10.3 mg/dL   Total Protein 7.5 6.5 - 8.1 g/dL   Albumin 4.0 3.5 - 5.0 g/dL   AST 18 15 - 41 U/L   ALT 21 0 - 44 U/L   Alkaline Phosphatase 68 38 - 126 U/L   Total Bilirubin 0.6 0.3 - 1.2 mg/dL   GFR, Estimated >60 >60 mL/min    Comment: (NOTE) Calculated using the CKD-EPI Creatinine Equation (2021)  Anion gap 10 5 - 15    Comment: Performed at [facility], [address] [place], [place] [address]  CBC     Status: Abnormal   Collection Time: 01/15/21 12:55 PM  Result Value Ref Range   WBC 16.2 (H) 4.0 - 10.5 K/uL   RBC 4.78 4.22 - 5.81 MIL/uL   Hemoglobin 15.7 13.0 - 17.0 g/dL   HCT 46.4 39.0 - 52.0 %   MCV 97.1 80.0 - 100.0 fL   MCH 32.8 26.0 - 34.0 pg   MCHC 33.8 30.0 - 36.0 g/dL   RDW 13.4 11.5 - 15.5 %   Platelets 212 150 - 400 K/uL   nRBC 0.0 0.0 - 0.2 %    Comment: Performed at [facility], [address] [place], [place] [address]  Urinalysis, Routine w reflex microscopic Urine, Clean Catch     Status: Abnormal   Collection Time: 01/15/21 12:55 PM  Result Value Ref Range   Color, Urine YELLOW YELLOW   APPearance CLEAR CLEAR   Specific Gravity, Urine 1.025 1.005 - 1.030   pH 6.0 5.0 - 8.0   Glucose, UA NEGATIVE NEGATIVE mg/dL   Hgb urine dipstick NEGATIVE NEGATIVE   Bilirubin Urine SMALL (A) NEGATIVE   Ketones, ur NEGATIVE NEGATIVE mg/dL   Protein, ur NEGATIVE NEGATIVE mg/dL   Nitrite NEGATIVE NEGATIVE   Leukocytes,Ua NEGATIVE NEGATIVE    Comment: Microscopic not done on urines with  negative protein, blood, leukocytes, nitrite, or glucose < 500 mg/dL. Performed at [facility], [address] [place], [place] [address]   Resp Panel by RT-PCR (Flu A&B, Covid) Nasopharyngeal Swab     Status: None   Collection Time: 01/15/21  6:46 PM   Specimen: Nasopharyngeal Swab; Nasopharyngeal(NP) swabs in vial transport medium  Result Value Ref Range   SARS Coronavirus 2 by RT PCR NEGATIVE NEGATIVE    Comment: (NOTE) SARS-CoV-2 target nucleic acids are NOT DETECTED.  The SARS-CoV-2 RNA is generally detectable in upper respiratory specimens during the acute phase of infection. The lowest concentration of SARS-CoV-2 viral copies this assay can detect is 138 copies/mL. A negative result does not preclude SARS-Cov-2 infection and should not be used as the sole basis for treatment or other patient management decisions. A negative result may occur with  improper specimen collection/handling, submission of specimen other than nasopharyngeal swab, presence of viral mutation(s) within the areas targeted by this assay, and inadequate number of viral copies(<138 copies/mL). A negative result must be combined with clinical observations, patient history, and epidemiological information. The expected result is Negative.  Fact Sheet for Patients:  EntrepreneurPulse.com.au  Fact Sheet for Healthcare Providers:  IncredibleEmployment.be  This test is no t yet approved or cleared by the [place] FDA and  has been authorized for detection and/or diagnosis of SARS-CoV-2 by FDA under an Emergency Use Authorization (EUA). This EUA will remain  in effect (meaning this test can be used) for the duration of the COVID-19 declaration under Section 564(b)(1) of the Act, 21 U.S.C.section 360bbb-3(b)(1), unless the authorization is terminated  or revoked sooner.       Influenza A by PCR NEGATIVE NEGATIVE   Influenza B by PCR NEGATIVE NEGATIVE     Comment: (NOTE) The Xpert Xpress SARS-CoV-2/FLU/RSV plus assay is intended as an aid in the diagnosis of influenza from Nasopharyngeal swab specimens and should not be used as a sole basis for treatment. Nasal washings and aspirates are unacceptable for  Xpert Xpress SARS-CoV-2/FLU/RSV testing.  Fact Sheet for Patients: EntrepreneurPulse.com.au  Fact Sheet for Healthcare Providers: IncredibleEmployment.be  This test is not yet approved or cleared by the [place] FDA and has been authorized for detection and/or diagnosis of SARS-CoV-2 by FDA under an Emergency Use Authorization (EUA). This EUA will remain in effect (meaning this test can be used) for the duration of the COVID-19 declaration under Section 564(b)(1) of the Act, 21 U.S.C. section 360bbb-3(b)(1), unless the authorization is terminated or revoked.  Performed at [facility], [address] [place], [place] [address]    CT ABDOMEN PELVIS W CONTRAST  Result Date: 01/15/2021 CLINICAL DATA:  Right lower quadrant pain. EXAM: CT ABDOMEN AND PELVIS WITH CONTRAST TECHNIQUE: Multidetector CT imaging of the abdomen and pelvis was performed using the standard protocol following bolus administration of intravenous contrast. CONTRAST:  123mL OMNIPAQUE IOHEXOL 300 MG/ML  SOLN COMPARISON:  None. FINDINGS: Lower chest: There is atelectasis in the lung bases. Hepatobiliary: There is a hypodensity in the right lobe of the liver which is too small to characterize measuring 12 mm, possibly a complex and indeterminate. Gallbladder and bile ducts are within normal limits. Pancreas: Unremarkable. No pancreatic ductal dilatation or surrounding inflammatory changes. Spleen: Normal in size without focal abnormality. Adrenals/Urinary Tract: Adrenal glands are unremarkable. Kidneys are normal, without renal calculi, focal lesion, or hydronephrosis. Bladder is unremarkable. Stomach/Bowel: The tip of the  appendix is prominent in size measuring 9 mm in there is mild surrounding inflammatory stranding. Proximal appendix is within normal limits. No bowel obstruction or free air. There is colonic diverticulosis without evidence for diverticulitis. Small bowel and stomach are within normal limits. Vascular/Lymphatic: Aortic atherosclerosis. No enlarged abdominal or pelvic lymph nodes. Reproductive: Prostate is unremarkable. Other: No abdominal wall hernia or abnormality. No abdominopelvic ascites. Musculoskeletal: Multilevel degenerative changes affect the spine. Vertebroplasty changes are seen at T12. Laminectomy defects are seen at L3 and L4. IMPRESSION: 1. Findings concerning for early acute tip appendicitis. No perforation or abscess. 2. Colonic diverticulosis without evidence for acute diverticulitis. 3. Small hypodense lesion in the liver is indeterminate. Recommend further evaluation with MRI. Electronically Signed   By: Ronney Asters M.D.   On: 01/15/2021 18:22   DG CHEST PORT 1 VIEW  Result Date: 01/15/2021 CLINICAL DATA:  Preoperative chest exam for appendectomy. EXAM: PORTABLE CHEST 1 VIEW COMPARISON:  Chest x-ray 10/19/2019. FINDINGS: There is linear atelectasis or scarring in the left lung base. There is no pleural effusion or pneumothorax. Cardiomediastinal silhouette is within normal limits. No acute fractures are seen. IMPRESSION: Left basilar atelectasis/airspace disease. Electronically Signed   By: Ronney Asters M.D.   On: 01/15/2021 22:54      Assessment/Plan This is a 70 year old male presenting with acute onset abdominal pain, leukocytosis, and CT findings consistent with early appendicitis involving the tip of the appendix.  I reviewed his imaging.  There is some dilation and stranding around the tip of the appendix, but no fecalith.  Given his significant cardiopulmonary comorbidities, he is at increased risk for perioperative complications.  With early appendicitis in the absence of an  appendicolith, think it is likely he will respond to nonoperative management with IV antibiotics.  If his symptoms worsen or do not improve after 24 to 48 hours of antibiotic treatment, he may ultimately require appendectomy, in which case he will need preoperative cardiology and pulmonology evaluations.  I discussed this plan with the patient and his wife. - NPO except for sips of  clear liquids and medications - IV antibiotics (rocephin and flagyl) - Please hold Xarelto in case of need for operative intervention -Surgery will follow closely.   Michaelle Birks, MD [facility] General, Hepatobiliary and Pancreatic Surgery 01/15/21 10:57 PM

## 2021-01-15 NOTE — ED Provider Notes (Signed)
[facility] EMERGENCY DEPARTMENT Provider Note   CSN: [id number] Arrival date & time: 01/15/21  1227     History Chief Complaint  Patient presents with   Abdominal Pain    Robert Lang is a 70 y.o. male with a past medical history of prior STEMI, A. fib, COPD, IPF, hypertension presenting to the ED with a chief complaint of abdominal pain.  Symptoms began approximately 14 hours ago.  States that he woke up at 2 AM with sharp abdominal pain that began in his navel" now on the right side."  Had trouble having a bowel movement this morning and use a glycerin suppository.  He had a small bowel movement this morning.  He is unsure if he has been passing gas since then.  Reports nausea but denies any vomiting and was able to tolerate p.o. prior to arrival while taking his medication.  Had history of ventral hernia repair about 4 to 5 years ago.  Denies any sick contacts with similar symptoms.  No fever, chest pain, cough, shortness of breath, testicular pain.  He does have a history of kidney stones but states that this feels different than his pain associated with kidney stones.  Also reports some discomfort with urination.   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 M75.00)   Allergic rhinitis due to pollen 09/03/2013   J30.1)  Formatting of this note might be different from the original. J30.1)   Allergy    Anticoagulated 07/01/2016   Anxiety    Arthritis    Atrial fibrillation ([place])    Atrial flutter ([place]) 06/26/2015   CAD (coronary artery disease)    Chest  pain 06/26/2015   COPD (chronic obstructive pulmonary disease) ([place])    Coronary artery disease 07/06/2018   Cardiac catheterization 2017 showing 90% small diagonal branch disease   DDD (degenerative disc disease), cervical 09/03/2013   M50.90)  Formatting of this note might be different from the original. M50.90)   Depression    Depression    Depressive disorder 09/03/2013   Dizziness 10/28/2017   Dyslipidemia, goal LDL below 70 07/06/2018   Dyspnea on exertion 10/20/2018   Esophageal reflux 09/03/2013   Essential hypertension 07/06/2018   ETOH abuse 01/11/2019   6 pack of beer per day   Falls 10/28/2017   GERD (gastroesophageal reflux disease)    H/O amiodarone therapy 07/01/2016   Heart attack ([place])    Heart palpitations 01/27/2017   History of colon polyps    Hyperlipidemia    Hypertension    IPF (idiopathic pulmonary fibrosis) ([place])    Kidney stones    Neck pain 09/03/2013   Neuropathy 07/06/2018   Obstructive sleep apnea 08/05/2015   PLMD (periodic limb movement disorder) 11/04/2015   Polycythemia, 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 STORY: Due to alcohol/ tobacco   Pure hypercholesterolemia 09/03/2013   E78.0)  Formatting of this note might be different from the original. E78.0)   Screening for prostate cancer 09/03/2013   Smoker  01/11/2019   Smoking 07/06/2018   Status post ablation of atrial flutter 07/06/2018   2017    Patient Active Problem List   Diagnosis Date Noted   IPF (idiopathic pulmonary fibrosis) ([place]) 07/28/2020   Snoring 05/13/2020   Kidney stones    Hypertension    Hyperlipidemia    History of colon polyps    Heart attack ([place])    GERD (gastroesophageal reflux disease)    Depression    CAD (coronary artery disease)    Atrial fibrillation ([facility])    Arthritis    Anxiety    Allergy    Smoker 01/11/2019   ETOH abuse 01/11/2019   Acute ST elevation myocardial  infarction (STEMI) of inferolateral wall ([facility]) 01/10/2019   Dyspnea on exertion 10/20/2018   Status post ablation of atrial flutter 07/06/2018   Coronary artery disease 07/06/2018   Essential hypertension 07/06/2018   Dyslipidemia, goal LDL below 70 07/06/2018   Smoking 07/06/2018   Neuropathy 07/06/2018   Dizziness 10/28/2017   Falls 10/28/2017   Heart palpitations 01/27/2017   Anticoagulated 07/01/2016   H/O amiodarone therapy 07/01/2016   PLMD (periodic limb movement disorder) 11/04/2015   Obstructive sleep apnea 08/05/2015   Atrial flutter ([place]) 06/26/2015   Chest pain 06/26/2015   COPD (chronic obstructive pulmonary disease) ([place]) 06/26/2015   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   Allergic rhinitis due to pollen 09/03/2013   DDD (degenerative disc disease), cervical 09/03/2013   Depressive disorder 09/03/2013   Esophageal reflux 09/03/2013   Neck pain 09/03/2013   Polycythemia, secondary 09/03/2013   Pure hypercholesterolemia 09/03/2013   Screening for prostate cancer 09/03/2013    Past Surgical History:  Procedure Laterality Date   ATRIAL FIBRILLATION ABLATION  10/2015   BACK SURGERY     CARDIOVERSION  2017   CATARACT EXTRACTION Bilateral 2017   March and April 2017   COLONOSCOPY  2018   CORONARY STENT PLACEMENT  2012   CORONARY/GRAFT ACUTE MI REVASCULARIZATION N/A 01/10/2019   Procedure: CORONARY/GRAFT ACUTE MI REVASCULARIZATION;  Surgeon: Leonie Man, MD;  Location: [facility];  Service: Cardiovascular;  Laterality: N/A;   INGUINAL HERNIA REPAIR     over 20 years ago   LEFT HEART CATH AND CORONARY ANGIOGRAPHY N/A 01/10/2019   Procedure: LEFT HEART CATH AND CORONARY ANGIOGRAPHY;  Surgeon: Leonie Man, MD;  Location: [facility];  Service: Cardiovascular;  Laterality: N/A;   LUMBAR LAMINECTOMY Bilateral 04/05/2016   L2-L5    VENTRAL HERNIA REPAIR  2018       Family History  Problem Relation Age of Onset   Colon cancer Father     Throat cancer Brother     Social History   Tobacco Use   Smoking status: Former    Packs/day: 1.00    Years: 30.00    Pack years: 30.00    Types: Cigarettes    Quit date: 01/09/2018    Years since quitting: 3.0   Smokeless tobacco: Former  Scientific laboratory technician Use: Never used  Substance Use Topics   Alcohol use: Yes    Comment: 8oz of wine a day   Drug use: Never    Home Medications Prior to Admission medications   Medication Sig Start Date End Date Taking? Authorizing Provider  sertraline (ZOLOFT) 100 MG tablet Take 200 mg by mouth daily.   Yes [provider]  atorvastatin (LIPITOR) 80 MG tablet TAKE 1 TABLET(80 MG) BY MOUTH DAILY AT 6 PM 11/10/20  Park Liter, MD  buPROPion (WELLBUTRIN XL) 300 MG 24 hr tablet Take 1 tablet (300 mg total) by mouth daily. 07/09/19   [name], Percell Miller, [name]  busPIRone (BUSPAR) 7.5 MG tablet Take 1 tablet (7.5 mg total) by mouth 2 (two) times daily. 01/13/21   [name], Percell Miller, [name]  Cholecalciferol 25 MCG (1000 UT) tablet Take 1,000 Units by mouth daily.     [provider]  docusate sodium (COLACE) 50 MG capsule Take 50 mg by mouth at bedtime.    [provider]  enalapril (VASOTEC) 2.5 MG tablet Take 2.5 mg by mouth daily. 09/26/20   [provider]  famotidine (PEPCID) 40 MG tablet TAKE 1 TABLET(40 MG) BY MOUTH DAILY 02/27/20   Park Liter, MD  fexofenadine (ALLEGRA) 180 MG tablet Take 180 mg by mouth at bedtime.  04/09/08   [provider]  finasteride (PROSCAR) 5 MG tablet Take 1 tablet (5 mg total) by mouth daily. 04/30/20   [name], Percell Miller, [name]  gabapentin (NEURONTIN) 100 MG capsule 4 tab po q hs. 11/10/20   [name], Percell Miller, [name]  Ginger, Zingiber officinalis, (GINGER ROOT) 500 MG CAPS Take 500 mg by mouth.    [provider]  Multiple Vitamins-Minerals (PRESERVISION AREDS PO) Take 2 capsules by mouth in the morning and at bedtime.    [provider]  nitroGLYCERIN  (NITROSTAT) 0.4 MG SL tablet Place 1 tablet (0.4 mg total) under the tongue every 5 (five) minutes as needed for chest pain. 10/11/18   Park Liter, MD  pantoprazole (PROTONIX) 40 MG tablet Take 40 mg by mouth daily. 10/31/20   [provider]  Pirfenidone (ESBRIET) 801 MG TABS Take 801 mg by mouth 3 (three) times daily. 01/12/21   Brand Males, MD  Polyethylene Glycol 3350 (MIRALAX PO) Take by mouth at bedtime.    [provider]  ranolazine (RANEXA) 1000 MG SR tablet Take 1 tablet (1,000 mg total) by mouth 2 (two) times daily. 04/30/20   [name], Percell Miller, [name]  solifenacin (VESICARE) 5 MG tablet Take 5 mg by mouth daily.    [provider]  traZODone (DESYREL) 50 MG tablet TAKE 1/2 TO 1 TABLET(25 TO 50 MG) BY MOUTH AT BEDTIME AS NEEDED FOR SLEEP 01/05/21   [name], Percell Miller, [name]  Turmeric 500 MG CAPS Take 1 capsule by mouth 2 (two) times daily.     [provider]  vitamin B-12 (CYANOCOBALAMIN) 1000 MCG tablet Take 1,000 mcg by mouth daily.    [provider]  XARELTO 20 MG TABS tablet TAKE 1 TABLET(20 MG) BY MOUTH DAILY WITH SUPPER 10/20/20   Park Liter, MD    Allergies    Naproxen  Review of Systems   Review of Systems  Constitutional:  Negative for appetite change, chills and fever.  HENT:  Negative for ear pain, rhinorrhea, sneezing and sore throat.   Eyes:  Negative for photophobia and visual disturbance.  Respiratory:  Negative for cough, chest tightness, shortness of breath and wheezing.   Cardiovascular:  Negative for chest pain and palpitations.  Gastrointestinal:  Positive for abdominal pain and nausea. Negative for blood in stool, diarrhea and vomiting.  Genitourinary:  Positive for dysuria. Negative for hematuria and urgency.  Musculoskeletal:  Negative for myalgias.  Skin:  Negative for rash.  Neurological:  Negative for dizziness, weakness and light-headedness.   Physical Exam Updated Vital Signs BP (!) 172/93  (BP Location: Left Arm)   Pulse (!) 109   Temp 97.6 F (36.4  C)   Resp 20   Ht 5\' 11"  (1.803 m)   Wt 85.5 kg   SpO2 93%   BMI 26.29 kg/m   Physical Exam Vitals and nursing note reviewed.  Constitutional:      General: He is not in acute distress.    Appearance: He is well-developed.  HENT:     Head: Normocephalic and atraumatic.     Nose: Nose normal.  Eyes:     General: No scleral icterus.       Left eye: No discharge.     Conjunctiva/sclera: Conjunctivae normal.  Cardiovascular:     Rate and Rhythm: Normal rate and regular rhythm.     Heart sounds: Normal heart sounds. No murmur heard.   No friction rub. No gallop.  Pulmonary:     Effort: Pulmonary effort is normal. No respiratory distress.     Breath sounds: Normal breath sounds.  Abdominal:     General: Bowel sounds are normal. There is no distension.     Palpations: Abdomen is soft.     Tenderness: There is abdominal tenderness (R flank) in the right lower quadrant. There is no guarding.  Musculoskeletal:        General: Normal range of motion.     Cervical back: Normal range of motion and neck supple.  Skin:    General: Skin is warm and dry.     Findings: No rash.  Neurological:     Mental Status: He is alert.     Motor: No abnormal muscle tone.     Coordination: Coordination normal.    ED Results / Procedures / Treatments   Labs (all labs ordered are listed, but only abnormal results are displayed) Labs Reviewed  COMPREHENSIVE METABOLIC PANEL - Abnormal; Notable for the following components:      Result Value   Sodium 133 (*)    Chloride 97 (*)    All other components within normal limits  CBC - Abnormal; Notable for the following components:   WBC 16.2 (*)    All other components within normal limits  URINALYSIS, ROUTINE W REFLEX MICROSCOPIC - Abnormal; Notable for the following components:   Bilirubin Urine SMALL (*)    All other components within normal limits  RESP PANEL BY RT-PCR (FLU A&B,  COVID) ARPGX2  LIPASE, BLOOD    EKG None  Radiology CT ABDOMEN PELVIS W CONTRAST  Result Date: 01/15/2021 CLINICAL DATA:  Right lower quadrant pain. EXAM: CT ABDOMEN AND PELVIS WITH CONTRAST TECHNIQUE: Multidetector CT imaging of the abdomen and pelvis was performed using the standard protocol following bolus administration of intravenous contrast. CONTRAST:  147mL OMNIPAQUE IOHEXOL 300 MG/ML  SOLN COMPARISON:  None. FINDINGS: Lower chest: There is atelectasis in the lung bases. Hepatobiliary: There is a hypodensity in the right lobe of the liver which is too small to characterize measuring 12 mm, possibly a complex and indeterminate. Gallbladder and bile ducts are within normal limits. Pancreas: Unremarkable. No pancreatic ductal dilatation or surrounding inflammatory changes. Spleen: Normal in size without focal abnormality. Adrenals/Urinary Tract: Adrenal glands are unremarkable. Kidneys are normal, without renal calculi, focal lesion, or hydronephrosis. Bladder is unremarkable. Stomach/Bowel: The tip of the appendix is prominent in size measuring 9 mm in there is mild surrounding inflammatory stranding. Proximal appendix is within normal limits. No bowel obstruction or free air. There is colonic diverticulosis without evidence for diverticulitis. Small bowel and stomach are within normal limits. Vascular/Lymphatic: Aortic atherosclerosis. No enlarged abdominal or pelvic lymph nodes. Reproductive:  Prostate is unremarkable. Other: No abdominal wall hernia or abnormality. No abdominopelvic ascites. Musculoskeletal: Multilevel degenerative changes affect the spine. Vertebroplasty changes are seen at T12. Laminectomy defects are seen at L3 and L4. IMPRESSION: 1. Findings concerning for early acute tip appendicitis. No perforation or abscess. 2. Colonic diverticulosis without evidence for acute diverticulitis. 3. Small hypodense lesion in the liver is indeterminate. Recommend further evaluation with MRI.  Electronically Signed   By: Ronney Asters M.D.   On: 01/15/2021 18:22    Procedures Procedures   Medications Ordered in ED Medications  cefTRIAXone (ROCEPHIN) 2 g in sodium chloride 0.9 % 100 mL IVPB (2 g Intravenous New Bag/Given 01/15/21 1848)    And  metroNIDAZOLE (FLAGYL) IVPB 500 mg (has no administration in time range)  0.9 %  sodium chloride infusion ( Intravenous New Bag/Given 01/15/21 1847)  sodium chloride 0.9 % bolus 1,000 mL (has no administration in time range)  ondansetron (ZOFRAN-ODT) disintegrating tablet 4 mg (4 mg Oral Given 01/15/21 1259)  fentaNYL (SUBLIMAZE) injection 25 mcg (25 mcg Intravenous Given 01/15/21 1635)  famotidine (PEPCID) IVPB 20 mg premix (0 mg Intravenous Stopped 01/15/21 1708)  HYDROmorphone (DILAUDID) injection 0.5 mg (0.5 mg Intravenous Given 01/15/21 1719)  iohexol (OMNIPAQUE) 300 MG/ML solution 100 mL (100 mLs Intravenous Contrast Given 01/15/21 1729)  HYDROmorphone (DILAUDID) injection 1 mg (1 mg Intravenous Given 01/15/21 1843)    ED Course  I have reviewed the triage vital signs and the nursing notes.  Pertinent labs & imaging results that were available during my care of the patient were reviewed by me and considered in my medical decision making (see chart for details).  Clinical Course as of 01/15/21 1858  Thu Jan 15, 2021  1601 WBC(!): 16.2 [HK]  1601 Leukocytes,Ua: NEGATIVE [HK]  1601 Nitrite: NEGATIVE [HK]  1601 Creatinine: 0.94 [HK]    Clinical Course User Index [HK] Delia Heady, [name]   MDM Rules/Calculators/A&P                           48-year-old male presenting to the ED with right-sided abdominal pain.  Had trouble having a bowel movement this morning.  Reports nausea but denies any vomiting.  Endorses some dysuria.  History of ventral hernia repair 4 to 5 years ago.  He is unsure if he has been passing gas since this morning.  He has not tried medications to help with symptoms.  Endorses ongoing nausea and pain.  On exam  there is tenderness palpation of the right lower quadrant and right flank area without rebound or guarding.  He is tachycardic I feel that this is secondary to his discomfort.  Work-up significant for leukocytosis of 16.2.  Urinalysis is negative for infection, CMP and lipase are unremarkable.  Will obtain CT of abdomen pelvis, attempt to treat symptomatically and reassess.  CT of abdomen pelvis shows findings consistent with acute early tip appendicitis without complicating feature.  I suspect this is the cause of his symptoms.  Will start antibiotics.  I spoke to Dr. Zenia Resides, on-call general surgeon at [facility].  She request transfer to [facility] ED from our ED to be evaluated by her.  I have spoke to Dr. Laverta Baltimore, EDP at [facility] ED who accepts the patient in transfer.  He remains hemodynamically stable here and pain is controlled.    Portions of this note were generated with [employer]. Dictation errors may occur despite best attempts at proofreading.  Final Clinical  Impression(s) / ED Diagnoses Final diagnoses:  Acute appendicitis, unspecified acute appendicitis type    Rx / DC Orders ED Discharge Orders     None        Delia Heady, [name] 01/15/21 1859    Drenda Freeze, MD 01/15/21 [phone]

## 2021-01-15 NOTE — ED Notes (Signed)
Pt already took Pirfenidone 801 mg tablet out of his own supply per what MD told pt.

## 2021-01-15 NOTE — ED Notes (Signed)
Patient transported to CT 

## 2021-01-16 ENCOUNTER — Telehealth: Payer: Self-pay | Admitting: Pharmacist

## 2021-01-16 ENCOUNTER — Encounter: Payer: Self-pay | Admitting: Medical

## 2021-01-16 DIAGNOSIS — K353 Acute appendicitis with localized peritonitis, without perforation or gangrene: Secondary | ICD-10-CM

## 2021-01-16 LAB — LACTIC ACID, PLASMA
Lactic Acid, Venous: 1.6 mmol/L (ref 0.5–1.9)
Lactic Acid, Venous: 2.3 mmol/L (ref 0.5–1.9)

## 2021-01-16 LAB — COMPREHENSIVE METABOLIC PANEL
ALT: 16 U/L (ref 0–44)
AST: 23 U/L (ref 15–41)
Albumin: 3.3 g/dL — ABNORMAL LOW (ref 3.5–5.0)
Alkaline Phosphatase: 63 U/L (ref 38–126)
Anion gap: 8 (ref 5–15)
BUN: 14 mg/dL (ref 8–23)
CO2: 25 mmol/L (ref 22–32)
Calcium: 8.5 mg/dL — ABNORMAL LOW (ref 8.9–10.3)
Chloride: 98 mmol/L (ref 98–111)
Creatinine, Ser: 0.82 mg/dL (ref 0.61–1.24)
GFR, Estimated: >60 mL/min (ref >60)
Glucose, Bld: 124 mg/dL — ABNORMAL HIGH (ref 70–99)
Potassium: 3.9 mmol/L (ref 3.5–5.1)
Sodium: 131 mmol/L — ABNORMAL LOW (ref 135–145)
Total Bilirubin: 0.8 mg/dL (ref 0.3–1.2)
Total Protein: 6.6 g/dL (ref 6.5–8.1)

## 2021-01-16 LAB — CBC WITH DIFFERENTIAL/PLATELET
Abs Immature Granulocytes: 0.1 10*3/uL — ABNORMAL HIGH (ref 0.00–0.07)
Basophils Absolute: 0 10*3/uL (ref 0.0–0.1)
Basophils Relative: 0 %
Eosinophils Absolute: 0.1 10*3/uL (ref 0.0–0.5)
Eosinophils Relative: 1 %
HCT: 42.2 % (ref 39.0–52.0)
Hemoglobin: 14 g/dL (ref 13.0–17.0)
Immature Granulocytes: 1 %
Lymphocytes Relative: 4 %
Lymphs Abs: 0.7 10*3/uL (ref 0.7–4.0)
MCH: 32.9 pg (ref 26.0–34.0)
MCHC: 33.2 g/dL (ref 30.0–36.0)
MCV: 99.3 fL (ref 80.0–100.0)
Monocytes Absolute: 1.6 10*3/uL — ABNORMAL HIGH (ref 0.1–1.0)
Monocytes Relative: 9 %
Neutro Abs: 15.2 10*3/uL — ABNORMAL HIGH (ref 1.7–7.7)
Neutrophils Relative %: 85 %
Platelets: 193 10*3/uL (ref 150–400)
RBC: 4.25 MIL/uL (ref 4.22–5.81)
RDW: 13.8 % (ref 11.5–15.5)
WBC: 17.7 10*3/uL — ABNORMAL HIGH (ref 4.0–10.5)
nRBC: 0 % (ref 0.0–0.2)

## 2021-01-16 LAB — PHOSPHORUS: Phosphorus: 4.2 mg/dL (ref 2.5–4.6)

## 2021-01-16 LAB — TSH: TSH: 1.232 u[IU]/mL (ref 0.350–4.500)

## 2021-01-16 LAB — MAGNESIUM: Magnesium: 2 mg/dL (ref 1.7–2.4)

## 2021-01-16 LAB — PROCALCITONIN: Procalcitonin: 0.48 ng/mL

## 2021-01-16 LAB — HIV ANTIBODY (ROUTINE TESTING W REFLEX): HIV Screen 4th Generation wRfx: NONREACTIVE

## 2021-01-16 MED ORDER — SODIUM CHLORIDE 0.9 % IV SOLN: INTRAVENOUS | Status: DC

## 2021-01-16 MED ORDER — SODIUM CHLORIDE 0.9 % IV SOLN
75.0000 mL/h | INTRAVENOUS | Status: AC
  Administered 2021-01-16: 75 mL/h via INTRAVENOUS

## 2021-01-16 MED ORDER — PIRFENIDONE 801 MG PO TABS
801.0000 mg | ORAL_TABLET | Freq: Three times a day (TID) | ORAL | Status: DC
  Administered 2021-01-16: 801 mg via ORAL
  Filled 2021-01-16 (×4): qty 1

## 2021-01-16 MED ORDER — RANOLAZINE ER 500 MG PO TB12
1000.0000 mg | ORAL_TABLET | Freq: Two times a day (BID) | ORAL | Status: DC
  Administered 2021-01-16 (×3): 1000 mg via ORAL
  Filled 2021-01-16 (×4): qty 2

## 2021-01-16 MED ORDER — SODIUM CHLORIDE 0.9 % IV SOLN
2.0000 g | INTRAVENOUS | Status: DC
  Administered 2021-01-16: 2 g via INTRAVENOUS
  Filled 2021-01-16: qty 20

## 2021-01-16 MED ORDER — THIAMINE HCL 100 MG/ML IJ SOLN: 100.0000 mg | Freq: Every day | INTRAMUSCULAR | Status: DC

## 2021-01-16 MED ORDER — HYDROMORPHONE HCL 1 MG/ML IJ SOLN: 0.5000 mg | INTRAMUSCULAR | Status: DC | PRN

## 2021-01-16 MED ORDER — MELATONIN 5 MG PO TABS: 10.0000 mg | ORAL_TABLET | Freq: Every evening | ORAL | Status: DC | PRN

## 2021-01-16 MED ORDER — GABAPENTIN 400 MG PO CAPS
400.0000 mg | ORAL_CAPSULE | Freq: Every day | ORAL | Status: DC
  Administered 2021-01-17: 400 mg via ORAL
  Filled 2021-01-16: qty 1

## 2021-01-16 MED ORDER — LORAZEPAM 2 MG/ML IJ SOLN
1.0000 mg | INTRAMUSCULAR | Status: DC | PRN
  Administered 2021-01-16: 3 mg via INTRAVENOUS
  Administered 2021-01-16: 2 mg via INTRAVENOUS
  Administered 2021-01-16: 3 mg via INTRAVENOUS
  Administered 2021-01-16: 2 mg via INTRAVENOUS
  Administered 2021-01-16: 1 mg via INTRAVENOUS
  Administered 2021-01-17: 2 mg via INTRAVENOUS
  Administered 2021-01-17: 1 mg via INTRAVENOUS
  Filled 2021-01-16: qty 2
  Filled 2021-01-16: qty 1
  Filled 2021-01-16: qty 2
  Filled 2021-01-16 (×4): qty 1

## 2021-01-16 MED ORDER — BUPROPION HCL ER (XL) 150 MG PO TB24
300.0000 mg | ORAL_TABLET | Freq: Every day | ORAL | Status: DC
  Administered 2021-01-16: 300 mg via ORAL
  Filled 2021-01-16: qty 1

## 2021-01-16 MED ORDER — THIAMINE HCL 100 MG PO TABS
100.0000 mg | ORAL_TABLET | Freq: Every day | ORAL | Status: DC
  Administered 2021-01-16: 100 mg via ORAL
  Filled 2021-01-16: qty 1

## 2021-01-16 MED ORDER — PANTOPRAZOLE SODIUM 40 MG PO TBEC
40.0000 mg | DELAYED_RELEASE_TABLET | Freq: Every day | ORAL | Status: DC
  Administered 2021-01-16: 40 mg via ORAL
  Filled 2021-01-16: qty 1

## 2021-01-16 MED ORDER — ENALAPRIL MALEATE 2.5 MG PO TABS
2.5000 mg | ORAL_TABLET | Freq: Every day | ORAL | Status: DC
  Filled 2021-01-16: qty 1

## 2021-01-16 MED ORDER — METRONIDAZOLE 500 MG/100ML IV SOLN
500.0000 mg | Freq: Three times a day (TID) | INTRAVENOUS | Status: DC
  Administered 2021-01-16 – 2021-01-17 (×4): 500 mg via INTRAVENOUS
  Filled 2021-01-16 (×4): qty 100

## 2021-01-16 MED ORDER — BUSPIRONE HCL 15 MG PO TABS
7.5000 mg | ORAL_TABLET | Freq: Two times a day (BID) | ORAL | Status: DC
  Administered 2021-01-16 (×3): 7.5 mg via ORAL
  Filled 2021-01-16 (×3): qty 1

## 2021-01-16 MED ORDER — SODIUM CHLORIDE 0.9 % IV SOLN: 75.0000 mL/h | INTRAVENOUS | Status: DC

## 2021-01-16 MED ORDER — LORAZEPAM 1 MG PO TABS
1.0000 mg | ORAL_TABLET | ORAL | Status: DC | PRN
  Administered 2021-01-16: 4 mg via ORAL
  Administered 2021-01-16: 2 mg via ORAL
  Filled 2021-01-16: qty 2
  Filled 2021-01-16: qty 4

## 2021-01-16 MED ORDER — ACETAMINOPHEN 325 MG PO TABS
650.0000 mg | ORAL_TABLET | Freq: Four times a day (QID) | ORAL | Status: DC | PRN
Start: 2021-01-16 — End: 2021-01-17

## 2021-01-16 MED ORDER — ATORVASTATIN CALCIUM 80 MG PO TABS
80.0000 mg | ORAL_TABLET | Freq: Every day | ORAL | Status: DC
  Administered 2021-01-16: 80 mg via ORAL
  Filled 2021-01-16: qty 2

## 2021-01-16 MED ORDER — FINASTERIDE 5 MG PO TABS: 5.0000 mg | ORAL_TABLET | Freq: Every day | ORAL | Status: DC

## 2021-01-16 MED ORDER — ACETAMINOPHEN 650 MG RE SUPP: 650.0000 mg | Freq: Four times a day (QID) | RECTAL | Status: DC | PRN

## 2021-01-16 MED ORDER — HYDROCODONE-ACETAMINOPHEN 5-325 MG PO TABS
1.0000 | ORAL_TABLET | ORAL | Status: DC | PRN
  Administered 2021-01-16: 1 via ORAL
  Filled 2021-01-16: qty 1

## 2021-01-16 NOTE — Progress Notes (Signed)
PROGRESS NOTE    Robert Lang  OIN:[id number] DOB: 10/28/1950 DOA: 01/15/2021 PCP: Mackie Pai, [name]    Brief Narrative:  70 year old gentleman with history of COPD, interstitial pulmonary fibrosis, coronary artery disease, A. fib, alcohol abuse, GERD, hyperlipidemia presented with sudden onset of abdominal pain and found to have acute uncomplicated appendicitis.  Admitted with antibiotics and surgical consultation.   Assessment & Plan:   Active Problems:   Coronary artery disease   Essential hypertension   Dyslipidemia, goal LDL below 70   Smoking   COPD (chronic obstructive pulmonary disease) (HCC)   ETOH abuse   CAD (coronary artery disease)   Atrial fibrillation (HCC)   IPF (idiopathic pulmonary fibrosis) (HCC)   Appendicitis   Alcohol abuse   OSA (obstructive sleep apnea)   Sepsis ([place])  Acute appendicitis: Without fecalith or rupture. General surgery on board. NPO.  IV fluids.  Adequate pain medications.  IV Rocephin and Flagyl. Continue monitoring serial abdominal exam, leukocytosis.  Recheck levels tomorrow morning.  If persistent pain or worsening WBC count, he may need laparoscopic appendectomy.  Essential hypertension: Blood pressures are stable.  As needed blood pressure medications.  Coronary artery disease: Without any evidence of acute coronary syndrome.  Patient remains on Lipitor.  Paroxysmal A. fib: On Xarelto at home.  On hold for possible surgical need.  No indication to bridge with heparin.  Idiopathic pulmonary fibrosis: Currently stable.  Obstructive sleep apnea: Uses CPAP at night.    DVT prophylaxis: SCDs Start: 01/16/21 0028   Code Status: Full code Family Communication: None Disposition Plan: Status is: Inpatient  Remains inpatient appropriate because: Ongoing IV antibiotics and IV fluids.  NPO.  Possible surgery tomorrow.         Consultants:  General surgery  Procedures:  None  Antimicrobials:  Rocephin and  Flagyl 10/27---   Subjective: Patient was seen and examined.  Was slightly impulsive in the emergency room.  Was not sure is whether he is hurting or not hurting.  No family at bedside.  Nursing reported he is getting confused at times and pulling out his IVs.  On my interview he was quiet and composed.  Objective: Vitals:   01/16/21 0937 01/16/21 1031 01/16/21 1042 01/16/21 1116  BP:   124/69 119/65  Pulse: (!) 101 (!) 110 (!) 101 (!) 101  Resp:   18 18  Temp:   98.3 F (36.8 C) 98 F (36.7 C)  TempSrc:   Oral   SpO2:   99% 95%  Weight:      Height:        Intake/Output Summary (Last 24 hours) at 01/16/2021 1306 Last data filed at 01/16/2021 0513 Gross per 24 hour  Intake 1347.2 ml  Output --  Net 1347.2 ml   Filed Weights   01/15/21 1235  Weight: 85.5 kg    Examination:  General exam: Appears calm and comfortable  Impulsive and agitated at times. Respiratory system: Clear to auscultation. Respiratory effort normal. Cardiovascular system: S1 & S2 heard, RRR. No JVD, murmurs, rubs, gallops or clicks. No pedal edema. Gastrointestinal system: Soft.  Nondistended.  Mildly tender on the right lower quadrant.  Bowel sounds present. Central nervous system: Alert and oriented. No focal neurological deficits.    Data Reviewed: I have personally reviewed following labs and imaging studies  CBC: Recent Labs  Lab 01/15/21 1255 01/16/21 0418  WBC 16.2* 17.7*  NEUTROABS  --  15.2*  HGB 15.7 14.0  HCT 46.4 42.2  MCV 97.1 99.3  PLT 212 824   Basic Metabolic Panel: Recent Labs  Lab 01/15/21 1255 01/16/21 0418  NA 133* 131*  K 4.1 3.9  CL 97* 98  CO2 26 25  GLUCOSE 87 124*  BUN 12 14  CREATININE 0.94 0.82  CALCIUM 9.4 8.5*  MG  --  2.0  PHOS  --  4.2   GFR: Estimated Creatinine Clearance: 89.3 mL/min (by C-G formula based on SCr of 0.82 mg/dL). Liver Function Tests: Recent Labs  Lab 01/15/21 1255 01/16/21 0418  AST 18 23  ALT 21 16  ALKPHOS 68 63   BILITOT 0.6 0.8  PROT 7.5 6.6  ALBUMIN 4.0 3.3*   Recent Labs  Lab 01/15/21 1255  LIPASE 50   No results for input(s): AMMONIA in the last 168 hours. Coagulation Profile: No results for input(s): INR, PROTIME in the last 168 hours. Cardiac Enzymes: No results for input(s): CKTOTAL, CKMB, CKMBINDEX, TROPONINI in the last 168 hours. BNP (last 3 results) Recent Labs    03/05/20 1343  PROBNP 109   HbA1C: No results for input(s): HGBA1C in the last 72 hours. CBG: No results for input(s): GLUCAP in the last 168 hours. Lipid Profile: No results for input(s): CHOL, HDL, LDLCALC, TRIG, CHOLHDL, LDLDIRECT in the last 72 hours. Thyroid Function Tests: Recent Labs    01/16/21 0140  TSH 1.232   Anemia Panel: No results for input(s): VITAMINB12, FOLATE, FERRITIN, TIBC, IRON, RETICCTPCT in the last 72 hours. Sepsis Labs: Recent Labs  Lab 01/16/21 0140 01/16/21 0418 01/16/21 1146  PROCALCITON  --  0.48  --   LATICACIDVEN 1.6  --  2.3*    Recent Results (from the past 240 hour(s))  Resp Panel by RT-PCR (Flu A&B, Covid) Nasopharyngeal Swab     Status: None   Collection Time: 01/15/21  6:46 PM   Specimen: Nasopharyngeal Swab; Nasopharyngeal(NP) swabs in vial transport medium  Result Value Ref Range Status   SARS Coronavirus 2 by RT PCR NEGATIVE NEGATIVE Final    Comment: (NOTE) SARS-CoV-2 target nucleic acids are NOT DETECTED.  The SARS-CoV-2 RNA is generally detectable in upper respiratory specimens during the acute phase of infection. The lowest concentration of SARS-CoV-2 viral copies this assay can detect is 138 copies/mL. A negative result does not preclude SARS-Cov-2 infection and should not be used as the sole basis for treatment or other patient management decisions. A negative result may occur with  improper specimen collection/handling, submission of specimen other than nasopharyngeal swab, presence of viral mutation(s) within the areas targeted by this assay,  and inadequate number of viral copies(<138 copies/mL). A negative result must be combined with clinical observations, patient history, and epidemiological information. The expected result is Negative.  Fact Sheet for Patients:  EntrepreneurPulse.com.au  Fact Sheet for Healthcare Providers:  IncredibleEmployment.be  This test is no t yet approved or cleared by the [place] FDA and  has been authorized for detection and/or diagnosis of SARS-CoV-2 by FDA under an Emergency Use Authorization (EUA). This EUA will remain  in effect (meaning this test can be used) for the duration of the COVID-19 declaration under Section 564(b)(1) of the Act, 21 U.S.C.section 360bbb-3(b)(1), unless the authorization is terminated  or revoked sooner.       Influenza A by PCR NEGATIVE NEGATIVE Final   Influenza B by PCR NEGATIVE NEGATIVE Final    Comment: (NOTE) The Xpert Xpress SARS-CoV-2/FLU/RSV plus assay is intended as an aid in the diagnosis of influenza from Nasopharyngeal swab specimens and should not  be used as a sole basis for treatment. Nasal washings and aspirates are unacceptable for Xpert Xpress SARS-CoV-2/FLU/RSV testing.  Fact Sheet for Patients: EntrepreneurPulse.com.au  Fact Sheet for Healthcare Providers: IncredibleEmployment.be  This test is not yet approved or cleared by the [place] FDA and has been authorized for detection and/or diagnosis of SARS-CoV-2 by FDA under an Emergency Use Authorization (EUA). This EUA will remain in effect (meaning this test can be used) for the duration of the COVID-19 declaration under Section 564(b)(1) of the Act, 21 U.S.C. section 360bbb-3(b)(1), unless the authorization is terminated or revoked.  Performed at [facility], [address] [place], [place] [address]   Culture, blood (x 2)     Status: None (Preliminary result)   Collection Time:  01/16/21  1:30 AM   Specimen: BLOOD LEFT FOREARM  Result Value Ref Range Status   Specimen Description BLOOD LEFT FOREARM  Final   Special Requests   Final    BOTTLES DRAWN AEROBIC AND ANAEROBIC Blood Culture adequate volume   Culture   Final    NO GROWTH < 12 HOURS Performed at [facility], [place] [address] [place], [place] [address]    Report Status PENDING  Incomplete  Culture, blood (x 2)     Status: None (Preliminary result)   Collection Time: 01/16/21  1:40 AM   Specimen: BLOOD LEFT HAND  Result Value Ref Range Status   Specimen Description BLOOD LEFT HAND  Final   Special Requests   Final    BOTTLES DRAWN AEROBIC AND ANAEROBIC Blood Culture adequate volume   Culture   Final    NO GROWTH < 12 HOURS Performed at [facility], [place] [address], [place], [place] [address]    Report Status PENDING  Incomplete         Radiology Studies: CT ABDOMEN PELVIS W CONTRAST  Result Date: 01/15/2021 CLINICAL DATA:  Right lower quadrant pain. EXAM: CT ABDOMEN AND PELVIS WITH CONTRAST TECHNIQUE: Multidetector CT imaging of the abdomen and pelvis was performed using the standard protocol following bolus administration of intravenous contrast. CONTRAST:  162mL OMNIPAQUE IOHEXOL 300 MG/ML  SOLN COMPARISON:  None. FINDINGS: Lower chest: There is atelectasis in the lung bases. Hepatobiliary: There is a hypodensity in the right lobe of the liver which is too small to characterize measuring 12 mm, possibly a complex and indeterminate. Gallbladder and bile ducts are within normal limits. Pancreas: Unremarkable. No pancreatic ductal dilatation or surrounding inflammatory changes. Spleen: Normal in size without focal abnormality. Adrenals/Urinary Tract: Adrenal glands are unremarkable. Kidneys are normal, without renal calculi, focal lesion, or hydronephrosis. Bladder is unremarkable. Stomach/Bowel: The tip of the appendix is prominent in size measuring 9 mm in there is mild surrounding  inflammatory stranding. Proximal appendix is within normal limits. No bowel obstruction or free air. There is colonic diverticulosis without evidence for diverticulitis. Small bowel and stomach are within normal limits. Vascular/Lymphatic: Aortic atherosclerosis. No enlarged abdominal or pelvic lymph nodes. Reproductive: Prostate is unremarkable. Other: No abdominal wall hernia or abnormality. No abdominopelvic ascites. Musculoskeletal: Multilevel degenerative changes affect the spine. Vertebroplasty changes are seen at T12. Laminectomy defects are seen at L3 and L4. IMPRESSION: 1. Findings concerning for early acute tip appendicitis. No perforation or abscess. 2. Colonic diverticulosis without evidence for acute diverticulitis. 3. Small hypodense lesion in the liver is indeterminate. Recommend further evaluation with MRI. Electronically Signed   By: Ronney Asters M.D.   On: 01/15/2021 18:22  DG CHEST PORT 1 VIEW  Result Date: 01/15/2021 CLINICAL DATA:  Preoperative chest exam for appendectomy. EXAM: PORTABLE CHEST 1 VIEW COMPARISON:  Chest x-ray 10/19/2019. FINDINGS: There is linear atelectasis or scarring in the left lung base. There is no pleural effusion or pneumothorax. Cardiomediastinal silhouette is within normal limits. No acute fractures are seen. IMPRESSION: Left basilar atelectasis/airspace disease. Electronically Signed   By: Ronney Asters M.D.   On: 01/15/2021 22:54        Scheduled Meds:  atorvastatin  80 mg Oral Daily   buPROPion  300 mg Oral Daily   busPIRone  7.5 mg Oral BID   pantoprazole  40 mg Oral Daily   Pirfenidone  801 mg Oral TID   ranolazine  1,000 mg Oral BID   thiamine  100 mg Oral Daily   Or   thiamine  100 mg Intravenous Daily   Continuous Infusions:  sodium chloride Stopped (01/16/21 0040)   sodium chloride     cefTRIAXone (ROCEPHIN)  IV     metronidazole Stopped (01/16/21 0513)     LOS: 1 day    Time spent: 30 minutes    Barb Merino, MD [facility] Pager [phone]

## 2021-01-16 NOTE — TOC CAGE-AID Note (Signed)
Transition of Care [facility]) - CAGE-AID Screening   Patient Details  Name: Robert Lang MRN: [id number] Date of Birth: [birth year]  Transition of Care [facility]) CM/SW Contact:    Emeterio Reeve, LCSW Phone Number: 01/16/2021, 2:28 PM   Clinical Narrative:  CSW spoke with pt and wife over the phone. Pt reports he drinks 2 glasses of wine a day. Per pt his PCP stated he can have 2 glasses Pt states he will cut back if his doctor suggests it.   CSW spoke to pts wife who stated that shes poors his two 4 oz glasses of wine a night. Wife stated she will work on decreasing his wine intake. Pt and wife declined resources.   CAGE-AID Screening:    Have You Ever Felt You Ought to Cut Down on Your Drinking or Drug Use?: No Have People Annoyed You By Critizing Your Drinking Or Drug Use?: No Have You Felt Bad Or Guilty About Your Drinking Or Drug Use?: No Have You Ever Had a Drink or Used Drugs First Thing In The Morning to Steady Your Nerves or to Get Rid of a Hangover?: No CAGE-AID Score: 0  Substance Abuse Education Offered: Yes       Emeterio Reeve, LCSW Clinical Social Worker

## 2021-01-16 NOTE — ED Notes (Signed)
Patient got out of bed, IV came out of his left forearm. Large gauze was placed on patient arm to stop the bleeding. He had removed all leads and gown. Patient voided on floor. The floor was cleaned with Clorox wipes. Patient was also assisted back onto bed. Given warm blankets.

## 2021-01-16 NOTE — Telephone Encounter (Signed)
Dr. Chase Caller please advise on patient mychart message. Thank you    I just spoke with [facility] in pharmacy.  I just turned over the Esbriet medication to pharmacy at [facility]. I brought Robert Lang to ER yesterday and was admitted to [facility] this morning.  He has early appendicitis the surgeon spoke with [place] last night and she does not want to do surgery with his medical hx.  So option one is she is going to treat this with antibiotics  and watch him for a few days then option two is surgery if antibiotics doesn't work.  Just want to keep you posted. Gaye Pollack

## 2021-01-16 NOTE — ED Notes (Signed)
Pt is continually pulling off cardiac monitor all cords.  Pt was found up walking in the room with his IV pulled out and all cords off.  Will notify MD via secure chat.

## 2021-01-16 NOTE — Telephone Encounter (Signed)
Patient's wife, [name], called me stating that patient is hospitalized due to appendicitis. She states the decision was made to forgo surgery and take conservative approach to management using IV antibiotics. However, she states that today they discussed re-evaluating tomorrow. If patient is not responding to antibiotics, will consider surgery. Patient is NPO right now. She was curious about giving Esbriet tabs to inpatient pharmacy.  Advised that inpatient pharmacy requests speciality medications be provided by patient because of cost and since they are non-formulary. Advised that it is okay for her to give Esbriet to the hospital pharmacy and if he is off of the medication for >14 days, the rec is to re-titrate from low dose.  Also advised that if patient is NPO, then he will not be receiving Esbriet PO.  Surgery consult: Dr. Michaelle Birks  Routing to Dr. Chase Caller as Shon Millet, PharmD, MPH, BCPS Clinical Pharmacist (Rheumatology and Pulmonology)

## 2021-01-16 NOTE — Progress Notes (Signed)
Progress Note     Subjective: Patient reports he is still having some abdominal pain, about the same as last night. He denies nausea this AM.   Objective: Vital signs in last 24 hours: Temp:  [97.6 F (36.4 C)-98.1 F (36.7 C)] 98.1 F (36.7 C) (10/28 0041) Pulse Rate:  [92-117] 101 (10/28 0815) Resp:  [17-30] 18 (10/28 0815) BP: (90-172)/(50-93) 104/67 (10/28 0815) SpO2:  [92 %-96 %] 94 % (10/28 0815) Weight:  [85.5 kg] 85.5 kg (10/27 1235)    Intake/Output from previous day: 10/27 0701 - 10/28 0700 In: 1347.2 [I.V.:2.5; IV Piggyback:1344.7] Out: -  Intake/Output this shift: No intake/output data recorded.  PE: General: pleasant, WD, overweight male who is laying in bed in NAD Heart: regular, rate, and rhythm.   Lungs: Respiratory effort nonlabored Abd: soft, focally ttp in RLQ without peritonitis, ND, +BS Skin: warm and dry with no masses, lesions, or rashes    Lab Results:  Recent Labs    01/15/21 1255 01/16/21 0418  WBC 16.2* 17.7*  HGB 15.7 14.0  HCT 46.4 42.2  PLT 212 193   BMET Recent Labs    01/15/21 1255 01/16/21 0418  NA 133* 131*  K 4.1 3.9  CL 97* 98  CO2 26 25  GLUCOSE 87 124*  BUN 12 14  CREATININE 0.94 0.82  CALCIUM 9.4 8.5*   PT/INR No results for input(s): LABPROT, INR in the last 72 hours. CMP     Component Value Date/Time   NA 131 (L) 01/16/2021 0418   NA 137 03/05/2020 1343   K 3.9 01/16/2021 0418   CL 98 01/16/2021 0418   CO2 25 01/16/2021 0418   GLUCOSE 124 (H) 01/16/2021 0418   BUN 14 01/16/2021 0418   BUN 14 03/05/2020 1343   CREATININE 0.82 01/16/2021 0418   CREATININE 0.95 01/25/2020 1134   CALCIUM 8.5 (L) 01/16/2021 0418   PROT 6.6 01/16/2021 0418   PROT 6.9 04/20/2019 0807   ALBUMIN 3.3 (L) 01/16/2021 0418   ALBUMIN 4.3 04/20/2019 0807   AST 23 01/16/2021 0418   ALT 16 01/16/2021 0418   ALKPHOS 63 01/16/2021 0418   BILITOT 0.8 01/16/2021 0418   BILITOT 0.5 04/20/2019 0807   GFRNONAA >60 01/16/2021 0418    GFRAA 81 03/05/2020 1343   Lipase     Component Value Date/Time   LIPASE 50 01/15/2021 1255       Studies/Results: CT ABDOMEN PELVIS W CONTRAST  Result Date: 01/15/2021 CLINICAL DATA:  Right lower quadrant pain. EXAM: CT ABDOMEN AND PELVIS WITH CONTRAST TECHNIQUE: Multidetector CT imaging of the abdomen and pelvis was performed using the standard protocol following bolus administration of intravenous contrast. CONTRAST:  130mL OMNIPAQUE IOHEXOL 300 MG/ML  SOLN COMPARISON:  None. FINDINGS: Lower chest: There is atelectasis in the lung bases. Hepatobiliary: There is a hypodensity in the right lobe of the liver which is too small to characterize measuring 12 mm, possibly a complex and indeterminate. Gallbladder and bile ducts are within normal limits. Pancreas: Unremarkable. No pancreatic ductal dilatation or surrounding inflammatory changes. Spleen: Normal in size without focal abnormality. Adrenals/Urinary Tract: Adrenal glands are unremarkable. Kidneys are normal, without renal calculi, focal lesion, or hydronephrosis. Bladder is unremarkable. Stomach/Bowel: The tip of the appendix is prominent in size measuring 9 mm in there is mild surrounding inflammatory stranding. Proximal appendix is within normal limits. No bowel obstruction or free air. There is colonic diverticulosis without evidence for diverticulitis. Small bowel and stomach are within normal limits.  Vascular/Lymphatic: Aortic atherosclerosis. No enlarged abdominal or pelvic lymph nodes. Reproductive: Prostate is unremarkable. Other: No abdominal wall hernia or abnormality. No abdominopelvic ascites. Musculoskeletal: Multilevel degenerative changes affect the spine. Vertebroplasty changes are seen at T12. Laminectomy defects are seen at L3 and L4. IMPRESSION: 1. Findings concerning for early acute tip appendicitis. No perforation or abscess. 2. Colonic diverticulosis without evidence for acute diverticulitis. 3. Small hypodense lesion in  the liver is indeterminate. Recommend further evaluation with MRI. Electronically Signed   By: Ronney Asters M.D.   On: 01/15/2021 18:22   DG CHEST PORT 1 VIEW  Result Date: 01/15/2021 CLINICAL DATA:  Preoperative chest exam for appendectomy. EXAM: PORTABLE CHEST 1 VIEW COMPARISON:  Chest x-ray 10/19/2019. FINDINGS: There is linear atelectasis or scarring in the left lung base. There is no pleural effusion or pneumothorax. Cardiomediastinal silhouette is within normal limits. No acute fractures are seen. IMPRESSION: Left basilar atelectasis/airspace disease. Electronically Signed   By: Ronney Asters M.D.   On: 01/15/2021 22:54    Anti-infectives: Anti-infectives (From admission, onward)    Start     Dose/Rate Route Frequency Ordered Stop   01/16/21 1600  cefTRIAXone (ROCEPHIN) 2 g in sodium chloride 0.9 % 100 mL IVPB        2 g 200 mL/hr over 30 Minutes Intravenous Every 24 hours 01/16/21 0027     01/16/21 0400  metroNIDAZOLE (FLAGYL) IVPB 500 mg        500 mg 100 mL/hr over 60 Minutes Intravenous Every 8 hours 01/16/21 0027     01/15/21 1845  cefTRIAXone (ROCEPHIN) 2 g in sodium chloride 0.9 % 100 mL IVPB       See Hyperspace for full Linked Orders Report.   2 g 200 mL/hr over 30 Minutes Intravenous  Once 01/15/21 1835 01/15/21 1918   01/15/21 1845  metroNIDAZOLE (FLAGYL) IVPB 500 mg       See Hyperspace for full Linked Orders Report.   500 mg 100 mL/hr over 60 Minutes Intravenous  Once 01/15/21 1835 01/15/21 2032        Assessment/Plan Early appendicitis  - WBC 17 from 16, afebrile - focally ttp in RLQ without peritonitis - continue IV abx - ice chips and sips with meds - will monitor closely, hopefully will improve with conservative management  FEN: ice chips, IVF per TR[facility] VTE: xarelto on hold, ok for heparin gtt if needed  ID: rocephin/flagyl 10/27>>  Hx of STEMI 2020 OSA ILD COPD A. Fib on xarelto  HTN HLD GERD Depression/Anxiety    LOS: 1 day    Norm Parcel, [facility] Surgery 01/16/2021, 8:26 AM Please see Amion for pager number during day hours 7:00am-4:30pm

## 2021-01-17 ENCOUNTER — Encounter (HOSPITAL_COMMUNITY): Payer: Self-pay | Admitting: Internal Medicine

## 2021-01-17 ENCOUNTER — Inpatient Hospital Stay (HOSPITAL_COMMUNITY): Payer: Medicare Other | Admitting: Certified Registered Nurse Anesthetist

## 2021-01-17 ENCOUNTER — Encounter (HOSPITAL_COMMUNITY)
Admission: EM | Disposition: A | Discharge status: Home / Self Care | Payer: Self-pay | Source: Home / Self Care | Attending: Internal Medicine

## 2021-01-17 DIAGNOSIS — K353 Acute appendicitis with localized peritonitis, without perforation or gangrene: Secondary | ICD-10-CM | POA: Diagnosis not present

## 2021-01-17 HISTORY — PX: LAPAROSCOPIC APPENDECTOMY: SHX408

## 2021-01-17 SURGERY — APPENDECTOMY, LAPAROSCOPIC
Anesthesia: General | Site: Abdomen

## 2021-01-17 MED ORDER — ONDANSETRON HCL 4 MG/2ML IJ SOLN: 4.0000 mg | Freq: Four times a day (QID) | INTRAMUSCULAR | Status: DC | PRN

## 2021-01-17 MED ORDER — PHENYLEPHRINE 40 MCG/ML (10ML) SYRINGE FOR IV PUSH (FOR BLOOD PRESSURE SUPPORT)
PREFILLED_SYRINGE | INTRAVENOUS | Status: DC | PRN
  Administered 2021-01-17: 120 ug via INTRAVENOUS

## 2021-01-17 MED ORDER — MIDAZOLAM HCL 2 MG/2ML IJ SOLN
INTRAMUSCULAR | Status: AC
  Filled 2021-01-17: qty 2

## 2021-01-17 MED ORDER — PROPOFOL 10 MG/ML IV BOLUS
INTRAVENOUS | Status: AC
  Filled 2021-01-17: qty 40

## 2021-01-17 MED ORDER — OXYCODONE HCL 5 MG PO TABS: 5.0000 mg | ORAL_TABLET | Freq: Once | ORAL | Status: DC | PRN

## 2021-01-17 MED ORDER — BUPIVACAINE-EPINEPHRINE (PF) 0.25% -1:200000 IJ SOLN
INTRAMUSCULAR | Status: AC
  Filled 2021-01-17: qty 30

## 2021-01-17 MED ORDER — DEXAMETHASONE SODIUM PHOSPHATE 10 MG/ML IJ SOLN
INTRAMUSCULAR | Status: AC
  Filled 2021-01-17: qty 1

## 2021-01-17 MED ORDER — OXYCODONE HCL 5 MG/5ML PO SOLN: 5.0000 mg | Freq: Once | ORAL | Status: DC | PRN

## 2021-01-17 MED ORDER — FINASTERIDE 5 MG PO TABS
5.0000 mg | ORAL_TABLET | Freq: Every day | ORAL | Status: DC
  Administered 2021-01-17 – 2021-01-19 (×3): 5 mg via ORAL
  Filled 2021-01-17 (×3): qty 1

## 2021-01-17 MED ORDER — DARIFENACIN HYDROBROMIDE ER 7.5 MG PO TB24
7.5000 mg | ORAL_TABLET | Freq: Every day | ORAL | Status: DC
  Administered 2021-01-17 – 2021-01-19 (×3): 7.5 mg via ORAL
  Filled 2021-01-17 (×3): qty 1

## 2021-01-17 MED ORDER — FENTANYL CITRATE (PF) 100 MCG/2ML IJ SOLN: 25.0000 ug | INTRAMUSCULAR | Status: DC | PRN

## 2021-01-17 MED ORDER — HYDROMORPHONE HCL 1 MG/ML IJ SOLN
0.5000 mg | INTRAMUSCULAR | Status: DC | PRN
  Administered 2021-01-17 – 2021-01-19 (×7): 0.5 mg via INTRAVENOUS
  Filled 2021-01-17 (×7): qty 0.5

## 2021-01-17 MED ORDER — BUPIVACAINE-EPINEPHRINE 0.25% -1:200000 IJ SOLN
INTRAMUSCULAR | Status: DC | PRN
  Administered 2021-01-17: 30 mL

## 2021-01-17 MED ORDER — OXYCODONE HCL 5 MG PO TABS
10.0000 mg | ORAL_TABLET | ORAL | Status: DC | PRN
  Administered 2021-01-17 – 2021-01-19 (×6): 10 mg via ORAL
  Filled 2021-01-17 (×6): qty 2

## 2021-01-17 MED ORDER — LACTATED RINGERS IV SOLN: INTRAVENOUS | Status: DC

## 2021-01-17 MED ORDER — DOCUSATE SODIUM 100 MG PO CAPS
100.0000 mg | ORAL_CAPSULE | Freq: Two times a day (BID) | ORAL | Status: DC
  Administered 2021-01-17 – 2021-01-19 (×5): 100 mg via ORAL
  Filled 2021-01-17 (×5): qty 1

## 2021-01-17 MED ORDER — TRAZODONE HCL 50 MG PO TABS
50.0000 mg | ORAL_TABLET | Freq: Every day | ORAL | Status: DC
  Administered 2021-01-17 – 2021-01-18 (×2): 50 mg via ORAL
  Filled 2021-01-17 (×2): qty 1

## 2021-01-17 MED ORDER — NITROGLYCERIN 0.4 MG SL SUBL: 0.4000 mg | SUBLINGUAL_TABLET | SUBLINGUAL | Status: DC | PRN

## 2021-01-17 MED ORDER — MELATONIN 5 MG PO TABS
10.0000 mg | ORAL_TABLET | Freq: Every evening | ORAL | Status: DC | PRN
  Administered 2021-01-17 – 2021-01-18 (×2): 10 mg via ORAL
  Filled 2021-01-17 (×2): qty 2

## 2021-01-17 MED ORDER — ONDANSETRON HCL 4 MG/2ML IJ SOLN
INTRAMUSCULAR | Status: AC
  Filled 2021-01-17: qty 2

## 2021-01-17 MED ORDER — CHLORHEXIDINE GLUCONATE 0.12 % MT SOLN
15.0000 mL | Freq: Once | OROMUCOSAL | Status: AC
  Administered 2021-01-17: 15 mL via OROMUCOSAL
  Filled 2021-01-17: qty 15

## 2021-01-17 MED ORDER — GABAPENTIN 400 MG PO CAPS
400.0000 mg | ORAL_CAPSULE | Freq: Every day | ORAL | Status: DC
  Administered 2021-01-17 – 2021-01-18 (×2): 400 mg via ORAL
  Filled 2021-01-17 (×2): qty 1

## 2021-01-17 MED ORDER — FENTANYL CITRATE (PF) 250 MCG/5ML IJ SOLN
INTRAMUSCULAR | Status: DC | PRN
  Administered 2021-01-17: 25 ug via INTRAVENOUS
  Administered 2021-01-17: 50 ug via INTRAVENOUS
  Administered 2021-01-17: 100 ug via INTRAVENOUS
  Administered 2021-01-17: 50 ug via INTRAVENOUS

## 2021-01-17 MED ORDER — FAMOTIDINE 20 MG PO TABS
20.0000 mg | ORAL_TABLET | Freq: Every day | ORAL | Status: DC
  Administered 2021-01-17 – 2021-01-18 (×2): 20 mg via ORAL
  Filled 2021-01-17 (×2): qty 1

## 2021-01-17 MED ORDER — OXYCODONE HCL 5 MG PO TABS
5.0000 mg | ORAL_TABLET | ORAL | Status: DC | PRN
  Administered 2021-01-18 – 2021-01-19 (×3): 5 mg via ORAL
  Filled 2021-01-17 (×3): qty 1

## 2021-01-17 MED ORDER — LORAZEPAM 2 MG/ML IJ SOLN
0.5000 mg | Freq: Four times a day (QID) | INTRAMUSCULAR | Status: DC | PRN
  Administered 2021-01-17: 0.5 mg via INTRAVENOUS
  Filled 2021-01-17: qty 1

## 2021-01-17 MED ORDER — VITAMIN B-12 1000 MCG PO TABS
1000.0000 ug | ORAL_TABLET | Freq: Every day | ORAL | Status: DC
  Administered 2021-01-17 – 2021-01-19 (×3): 1000 ug via ORAL
  Filled 2021-01-17 (×3): qty 1

## 2021-01-17 MED ORDER — FENTANYL CITRATE (PF) 250 MCG/5ML IJ SOLN
INTRAMUSCULAR | Status: AC
  Filled 2021-01-17: qty 5

## 2021-01-17 MED ORDER — PROCHLORPERAZINE EDISYLATE 10 MG/2ML IJ SOLN: 10.0000 mg | INTRAMUSCULAR | Status: DC | PRN

## 2021-01-17 MED ORDER — 0.9 % SODIUM CHLORIDE (POUR BTL) OPTIME
TOPICAL | Status: DC | PRN
  Administered 2021-01-17: 1000 mL

## 2021-01-17 MED ORDER — SERTRALINE HCL 100 MG PO TABS
200.0000 mg | ORAL_TABLET | Freq: Every day | ORAL | Status: DC
  Administered 2021-01-17 – 2021-01-18 (×2): 200 mg via ORAL
  Filled 2021-01-17 (×2): qty 2

## 2021-01-17 MED ORDER — ONDANSETRON HCL 4 MG/2ML IJ SOLN
INTRAMUSCULAR | Status: DC | PRN
  Administered 2021-01-17: 4 mg via INTRAVENOUS

## 2021-01-17 MED ORDER — RANOLAZINE ER 500 MG PO TB12
1000.0000 mg | ORAL_TABLET | Freq: Two times a day (BID) | ORAL | Status: DC
  Administered 2021-01-17 – 2021-01-19 (×4): 1000 mg via ORAL
  Filled 2021-01-17 (×5): qty 2

## 2021-01-17 MED ORDER — ORAL CARE MOUTH RINSE: 15.0000 mL | Freq: Once | OROMUCOSAL | Status: AC

## 2021-01-17 MED ORDER — ACETAMINOPHEN 325 MG PO TABS
650.0000 mg | ORAL_TABLET | Freq: Four times a day (QID) | ORAL | Status: DC
Start: 2021-01-17 — End: 2021-01-19
  Administered 2021-01-17 – 2021-01-19 (×8): 650 mg via ORAL
  Filled 2021-01-17 (×8): qty 2

## 2021-01-17 MED ORDER — LIDOCAINE 2% (20 MG/ML) 5 ML SYRINGE
INTRAMUSCULAR | Status: DC | PRN
  Administered 2021-01-17: 60 mg via INTRAVENOUS

## 2021-01-17 MED ORDER — PROPOFOL 10 MG/ML IV BOLUS
INTRAVENOUS | Status: DC | PRN
  Administered 2021-01-17: 130 mg via INTRAVENOUS

## 2021-01-17 MED ORDER — SUGAMMADEX SODIUM 200 MG/2ML IV SOLN
INTRAVENOUS | Status: DC | PRN
  Administered 2021-01-17: 200 mg via INTRAVENOUS

## 2021-01-17 MED ORDER — DEXMEDETOMIDINE (PRECEDEX) IN NS 20 MCG/5ML (4 MCG/ML) IV SYRINGE
PREFILLED_SYRINGE | INTRAVENOUS | Status: DC | PRN
  Administered 2021-01-17 (×5): 8 ug via INTRAVENOUS

## 2021-01-17 MED ORDER — SODIUM CHLORIDE 0.9 % IR SOLN
Status: DC | PRN
  Administered 2021-01-17: 1000 mL

## 2021-01-17 MED ORDER — PIRFENIDONE 801 MG PO TABS
801.0000 mg | ORAL_TABLET | Freq: Three times a day (TID) | ORAL | Status: DC
  Administered 2021-01-18 – 2021-01-19 (×3): 801 mg via ORAL
  Filled 2021-01-17 (×4): qty 1

## 2021-01-17 MED ORDER — DEXMEDETOMIDINE (PRECEDEX) IN NS 20 MCG/5ML (4 MCG/ML) IV SYRINGE
PREFILLED_SYRINGE | INTRAVENOUS | Status: AC
  Filled 2021-01-17: qty 5

## 2021-01-17 MED ORDER — BUPROPION HCL ER (XL) 150 MG PO TB24
300.0000 mg | ORAL_TABLET | Freq: Every day | ORAL | Status: DC
  Administered 2021-01-17 – 2021-01-19 (×3): 300 mg via ORAL
  Filled 2021-01-17 (×3): qty 2

## 2021-01-17 MED ORDER — ROCURONIUM BROMIDE 10 MG/ML (PF) SYRINGE
PREFILLED_SYRINGE | INTRAVENOUS | Status: DC | PRN
  Administered 2021-01-17: 10 mg via INTRAVENOUS
  Administered 2021-01-17: 50 mg via INTRAVENOUS

## 2021-01-17 MED ORDER — DEXAMETHASONE SODIUM PHOSPHATE 10 MG/ML IJ SOLN
INTRAMUSCULAR | Status: DC | PRN
  Administered 2021-01-17: 4 mg via INTRAVENOUS

## 2021-01-17 MED ORDER — PHENYLEPHRINE HCL-NACL 20-0.9 MG/250ML-% IV SOLN
INTRAVENOUS | Status: DC | PRN
  Administered 2021-01-17: 40 ug/min via INTRAVENOUS

## 2021-01-17 MED ORDER — MIDAZOLAM HCL 2 MG/2ML IJ SOLN
INTRAMUSCULAR | Status: DC | PRN
  Administered 2021-01-17 (×2): 1 mg via INTRAVENOUS

## 2021-01-17 MED ORDER — PHENAZOPYRIDINE HCL 100 MG PO TABS
100.0000 mg | ORAL_TABLET | Freq: Three times a day (TID) | ORAL | Status: DC
  Administered 2021-01-17 – 2021-01-19 (×6): 100 mg via ORAL
  Filled 2021-01-17 (×7): qty 1

## 2021-01-17 MED ORDER — BUSPIRONE HCL 15 MG PO TABS
7.5000 mg | ORAL_TABLET | Freq: Two times a day (BID) | ORAL | Status: DC
  Administered 2021-01-17 – 2021-01-19 (×4): 7.5 mg via ORAL
  Filled 2021-01-17 (×4): qty 1

## 2021-01-17 SURGICAL SUPPLY — 45 items
BAG COUNTER SPONGE SURGICOUNT (BAG) ×2 IMPLANT
BLADE CLIPPER SURG (BLADE) ×2 IMPLANT
CANISTER SUCT 3000ML PPV (MISCELLANEOUS) ×2 IMPLANT
CHLORAPREP W/TINT 26 (MISCELLANEOUS) ×2 IMPLANT
COVER SURGICAL LIGHT HANDLE (MISCELLANEOUS) ×2 IMPLANT
CUTTER FLEX LINEAR 45M (STAPLE) ×2 IMPLANT
DERMABOND ADVANCED (GAUZE/BANDAGES/DRESSINGS) ×1
DERMABOND ADVANCED .7 DNX12 (GAUZE/BANDAGES/DRESSINGS) ×1 IMPLANT
DRAIN CHANNEL 19F RND (DRAIN) ×2 IMPLANT
ELECT REM PT RETURN 9FT ADLT (ELECTROSURGICAL) ×2
ELECTRODE REM PT RTRN 9FT ADLT (ELECTROSURGICAL) ×1 IMPLANT
EVACUATOR SILICONE 100CC (DRAIN) ×2 IMPLANT
GLOVE SURG ENC MOIS LTX SZ7.5 (GLOVE) ×2 IMPLANT
GLOVE SURG UNDER LTX SZ8 (GLOVE) ×2 IMPLANT
GOWN STRL REUS W/ TWL LRG LVL3 (GOWN DISPOSABLE) ×1 IMPLANT
GOWN STRL REUS W/ TWL XL LVL3 (GOWN DISPOSABLE) ×1 IMPLANT
GOWN STRL REUS W/TWL LRG LVL3 (GOWN DISPOSABLE) ×1
GOWN STRL REUS W/TWL XL LVL3 (GOWN DISPOSABLE) ×1
GRASPER SUT TROCAR 14GX15 (MISCELLANEOUS) ×2 IMPLANT
KIT BASIN OR (CUSTOM PROCEDURE TRAY) ×2 IMPLANT
KIT TURNOVER KIT B (KITS) ×2 IMPLANT
NEEDLE INSUFFLATION 14GA 120MM (NEEDLE) ×2 IMPLANT
NS IRRIG 1000ML POUR BTL (IV SOLUTION) ×2 IMPLANT
PAD ARMBOARD 7.5X6 YLW CONV (MISCELLANEOUS) ×4 IMPLANT
POUCH RETRIEVAL ECOSAC 10 (ENDOMECHANICALS) ×1 IMPLANT
POUCH RETRIEVAL ECOSAC 10MM (ENDOMECHANICALS) ×1
RELOAD 45 VASCULAR/THIN (ENDOMECHANICALS) ×2 IMPLANT
RELOAD STAPLER WHITE 60MM (STAPLE) ×2 IMPLANT
SCISSORS LAP 5X35 DISP (ENDOMECHANICALS) IMPLANT
SET IRRIG TUBING LAPAROSCOPIC (IRRIGATION / IRRIGATOR) ×2 IMPLANT
SET TUBE SMOKE EVAC HIGH FLOW (TUBING) ×2 IMPLANT
SHEARS HARMONIC ACE PLUS 36CM (ENDOMECHANICALS) ×2 IMPLANT
SLEEVE ENDOPATH XCEL 5M (ENDOMECHANICALS) ×2 IMPLANT
SPECIMEN JAR SMALL (MISCELLANEOUS) ×2 IMPLANT
SPONGE DRAIN TRACH 4X4 STRL 2S (GAUZE/BANDAGES/DRESSINGS) ×2 IMPLANT
STAPLE ECHEON FLEX 60 POW ENDO (STAPLE) ×2 IMPLANT
STAPLER RELOAD WHITE 60MM (STAPLE) ×4
SUT MNCRL AB 4-0 PS2 18 (SUTURE) ×2 IMPLANT
TOWEL GREEN STERILE FF (TOWEL DISPOSABLE) ×2 IMPLANT
TRAY FOLEY W/BAG SLVR 16FR (SET/KITS/TRAYS/PACK) ×1
TRAY FOLEY W/BAG SLVR 16FR ST (SET/KITS/TRAYS/PACK) ×1 IMPLANT
TRAY LAPAROSCOPIC MC (CUSTOM PROCEDURE TRAY) ×2 IMPLANT
TROCAR XCEL 12X100 BLDLESS (ENDOMECHANICALS) ×2 IMPLANT
TROCAR XCEL NON-BLD 5MMX100MML (ENDOMECHANICALS) ×2 IMPLANT
WATER STERILE IRR 1000ML POUR (IV SOLUTION) ×2 IMPLANT

## 2021-01-17 NOTE — Op Note (Signed)
Patient: Robert Lang (Feb 24, 1951, [id number])  Date of Surgery: 01/15/2021 - 01/17/2021   Preoperative Diagnosis: Appendicitis   Postoperative Diagnosis: Appendicitis   Surgical Procedure: APPENDECTOMY LAPAROSCOPIC:    Operative Team Members:  Surgeon(s) and Role:    * Raesha Coonrod, Nickola Major, MD - Primary   Anesthesiologist: Albertha Ghee, MD CRNA: Leonor Liv, CRNA   Anesthesia: General   Fluids:  Total I/O In: -  Out: 150 [ULAGT:364]  Complications: none  Drains:  (19 Fr) Jackson-Pratt drain(s) with closed bulb suction in the right pericolic gutter, exiting the suprapubic incision    Specimen:  ID Type Source Tests Collected by Time Destination  1 : Appendix  GI Appendix SURGICAL PATHOLOGY Lillieann Pavlich, Nickola Major, MD 01/17/2021 1119      Disposition:  PACU - hemodynamically stable.  Plan of Care:  Continued inpatient care.  JP drain to stay in place for 10-14 days, to be removed in office    Indications for Procedure: Robert Lang is a 70 y.o. male who presented with abdominal pain.  History, physical and imaging was concerning for appendicitis, so laparoscopic appendectomy was recommended for the patient.  The procedure itself, as well as the risks, benefits and alternatives were discussed with the patient.  Risks discussed included but were not limited to the risk of bleeding, infection, damage to nearby structures, need to convert to open procedure, incisional hernia, and the need for additional procedures or surgeries.  With this discussion complete and all questions answered the patient granted consent to proceed.  Findings: Inflamed retrocecal appendix with a gangrenous tip  Infection status: Patient: Robert Lang [facility] Service Patient Case: Emergent Infection Present At Time Of Surgery (PATOS): Infection of the gangrenous appendix   Description of Procedure:   On the date stated above, the patient was taken to the operating room  suite and placed in supine positioning with the left arm tucked.  Sequential compression devices were placed on the lower extremities to prevent blood clots.  General endotracheal anesthesia was induced.  A foley catheter was placed and will be removed as soon as possible in the postoperative period.  Preoperative antibiotics ( Ceftriaxone and flagyl ) were given on a scheduled basis prior to surgery.  The patient's abdomen was prepped and draped in the usual sterile fashion.  A time-out was completed verifying the correct patient, procedure, positioning and equipment needed for the case.  We began by anesthetizing the skin with local anesthetic and then making a 5 mm incision just below the umbilicus.  We dissected through the subcutaneous tissues to the fascia.  The fascia was grasped and elevated using a Kocher clamp.  A Veress needle was inserted into the abdomen and the abdomen was insufflated to 15 mmHg.  A 5 mm trocar was inserted in this position under optical guidance and then the abdomen was inspected.  There was no trauma to the underlying viscera with initial trocar placement.  Any abnormal findings, other than inflammation in the right lower quadrant, are listed above in the findings section.  Two additional trocars were placed, one 5 mm trocar suprapubically and one 12 mm trocar in the left lower quadrant.  These were placed under direct vision without any trauma to the underlying viscera.    The patient was then placed in head down, left side down positioning.  The appendix was identified and dissected free from its attachments to the abdominal wall, small intestine and cecum.  I had to mobilize the cecum out  of the retroperitoneum using the harmonic scalpel to expose the retrocecal base of the appendix.  A window was created in the mesoappendix using blunt dissection.  The mesoappendix was divided with the harmonic scalpal.  We used two 60 mm white load of the endoscopic linear stapler to divide  the base of the appendix from the cecum.  The staple line was across healthy cecum as the base of the appendix was inflamed.  The appendix was placed in an endocatch bag and removed through the 12 mm port site in the left lower quadrant.  A suction irrigator was used to clean the operative field. The staple line was well formed.  A JP drain was brought through the suprapubic port site and placed in the right pericolic gutter.  At this point we directed our attention to closure.  The patient was moved back to a level position.  The 12 mm trocar site was closed at the fascial level using an 0-vicryl on a fascial suture passer.  The abdomen was desufflated.  The skin was closed using 4-0 Monocryl and dermabond.  All sponge and needle counts were correct at the end of the case.    Robert Raw, MD General, Bariatric, & Minimally Invasive Surgery [facility], [place]

## 2021-01-17 NOTE — Progress Notes (Signed)
Progress Note     Subjective: Still confused.  Pain continues in RLQ.  Jumps with palpation.  After long discussion with patient (who is a bit confused but appropriate) and his wife, they would rather take the risks associated with surgery and his multiple comorbidities, than continue to wait to feel better with antibiotics alone as they feel he has made no progress.  Objective: Vital signs in last 24 hours: Temp:  [97.4 F (36.3 C)-98.3 F (36.8 C)] 97.4 F (36.3 C) (10/29 0742) Pulse Rate:  [69-110] 82 (10/29 0742) Resp:  [16-19] 16 (10/29 0742) BP: (100-124)/(65-78) 100/78 (10/29 0742) SpO2:  [93 %-99 %] 96 % (10/29 0742)    Intake/Output from previous day: 10/28 0701 - 10/29 0700 In: 912 [I.V.:527.4; IV Piggyback:384.5] Out: -  Intake/Output this shift: No intake/output data recorded.  PE: General: pleasant, WD, overweight male who is laying in bed in NAD Heart: regular, rate, and rhythm.   Lungs: Respiratory effort nonlabored Abd: soft, focally ttp in RLQ without peritonitis, ND, +BS Skin: warm and dry with no masses, lesions, or rashes    Lab Results:  Recent Labs    01/15/21 1255 01/16/21 0418  WBC 16.2* 17.7*  HGB 15.7 14.0  HCT 46.4 42.2  PLT 212 193    BMET Recent Labs    01/15/21 1255 01/16/21 0418  NA 133* 131*  K 4.1 3.9  CL 97* 98  CO2 26 25  GLUCOSE 87 124*  BUN 12 14  CREATININE 0.94 0.82  CALCIUM 9.4 8.5*    PT/INR No results for input(s): LABPROT, INR in the last 72 hours. CMP     Component Value Date/Time   NA 131 (L) 01/16/2021 0418   NA 137 03/05/2020 1343   K 3.9 01/16/2021 0418   CL 98 01/16/2021 0418   CO2 25 01/16/2021 0418   GLUCOSE 124 (H) 01/16/2021 0418   BUN 14 01/16/2021 0418   BUN 14 03/05/2020 1343   CREATININE 0.82 01/16/2021 0418   CREATININE 0.95 01/25/2020 1134   CALCIUM 8.5 (L) 01/16/2021 0418   PROT 6.6 01/16/2021 0418   PROT 6.9 04/20/2019 0807   ALBUMIN 3.3 (L) 01/16/2021 0418   ALBUMIN 4.3  04/20/2019 0807   AST 23 01/16/2021 0418   ALT 16 01/16/2021 0418   ALKPHOS 63 01/16/2021 0418   BILITOT 0.8 01/16/2021 0418   BILITOT 0.5 04/20/2019 0807   GFRNONAA >60 01/16/2021 0418   GFRAA 81 03/05/2020 1343   Lipase     Component Value Date/Time   LIPASE 50 01/15/2021 1255       Studies/Results: CT ABDOMEN PELVIS W CONTRAST  Result Date: 01/15/2021 CLINICAL DATA:  Right lower quadrant pain. EXAM: CT ABDOMEN AND PELVIS WITH CONTRAST TECHNIQUE: Multidetector CT imaging of the abdomen and pelvis was performed using the standard protocol following bolus administration of intravenous contrast. CONTRAST:  15mL OMNIPAQUE IOHEXOL 300 MG/ML  SOLN COMPARISON:  None. FINDINGS: Lower chest: There is atelectasis in the lung bases. Hepatobiliary: There is a hypodensity in the right lobe of the liver which is too small to characterize measuring 12 mm, possibly a complex and indeterminate. Gallbladder and bile ducts are within normal limits. Pancreas: Unremarkable. No pancreatic ductal dilatation or surrounding inflammatory changes. Spleen: Normal in size without focal abnormality. Adrenals/Urinary Tract: Adrenal glands are unremarkable. Kidneys are normal, without renal calculi, focal lesion, or hydronephrosis. Bladder is unremarkable. Stomach/Bowel: The tip of the appendix is prominent in size measuring 9 mm in there is mild  surrounding inflammatory stranding. Proximal appendix is within normal limits. No bowel obstruction or free air. There is colonic diverticulosis without evidence for diverticulitis. Small bowel and stomach are within normal limits. Vascular/Lymphatic: Aortic atherosclerosis. No enlarged abdominal or pelvic lymph nodes. Reproductive: Prostate is unremarkable. Other: No abdominal wall hernia or abnormality. No abdominopelvic ascites. Musculoskeletal: Multilevel degenerative changes affect the spine. Vertebroplasty changes are seen at T12. Laminectomy defects are seen at L3 and L4.  IMPRESSION: 1. Findings concerning for early acute tip appendicitis. No perforation or abscess. 2. Colonic diverticulosis without evidence for acute diverticulitis. 3. Small hypodense lesion in the liver is indeterminate. Recommend further evaluation with MRI. Electronically Signed   By: Ronney Asters M.D.   On: 01/15/2021 18:22   DG CHEST PORT 1 VIEW  Result Date: 01/15/2021 CLINICAL DATA:  Preoperative chest exam for appendectomy. EXAM: PORTABLE CHEST 1 VIEW COMPARISON:  Chest x-ray 10/19/2019. FINDINGS: There is linear atelectasis or scarring in the left lung base. There is no pleural effusion or pneumothorax. Cardiomediastinal silhouette is within normal limits. No acute fractures are seen. IMPRESSION: Left basilar atelectasis/airspace disease. Electronically Signed   By: Ronney Asters M.D.   On: 01/15/2021 22:54    Anti-infectives: Anti-infectives (From admission, onward)    Start     Dose/Rate Route Frequency Ordered Stop   01/16/21 1600  cefTRIAXone (ROCEPHIN) 2 g in sodium chloride 0.9 % 100 mL IVPB        2 g 200 mL/hr over 30 Minutes Intravenous Every 24 hours 01/16/21 0027     01/16/21 0400  metroNIDAZOLE (FLAGYL) IVPB 500 mg        500 mg 100 mL/hr over 60 Minutes Intravenous Every 8 hours 01/16/21 0027     01/15/21 1845  cefTRIAXone (ROCEPHIN) 2 g in sodium chloride 0.9 % 100 mL IVPB       See Hyperspace for full Linked Orders Report.   2 g 200 mL/hr over 30 Minutes Intravenous  Once 01/15/21 1835 01/15/21 1918   01/15/21 1845  metroNIDAZOLE (FLAGYL) IVPB 500 mg       See Hyperspace for full Linked Orders Report.   500 mg 100 mL/hr over 60 Minutes Intravenous  Once 01/15/21 1835 01/15/21 2032        Assessment/Plan Early appendicitis  - WBC 17 from 16, afebrile - focally ttp in RLQ without peritonitis - continue IV abx - ice chips and sips with meds - Again discussed surgery vs. antibiotic treatment for appendicitis with the patient and his wife.  Discussed the risks,  benefits and alternatives to both approaches.  After long discussion with patient (who is a bit confused but appropriate) and his wife, they would rather take the risks associated with surgery and his multiple comorbidities, than continue to wait to feel better with antibiotics alone as they feel he has made no progress and are worried about recurrent issues in the future.  I will add him on for laparoscopic, possibly open, appendectomy today.  FEN: ice chips, IVF per [facility] VTE: xarelto on hold, ok for heparin gtt if needed  ID: rocephin/flagyl 10/27>>  Hx of STEMI 2020 OSA ILD COPD A. Fib on xarelto  HTN HLD GERD Depression/Anxiety    LOS: 2 days    Felicie Morn, MD [facility] 01/17/2021, 8:15 AM Please see Amion for pager number during day hours 7:00am-4:30pm

## 2021-01-17 NOTE — Anesthesia Postprocedure Evaluation (Signed)
Anesthesia Post Note  Patient: Robert Lang  Procedure(s) Performed: APPENDECTOMY LAPAROSCOPIC (Abdomen)     Patient location during evaluation: PACU Anesthesia Type: General Level of consciousness: awake and alert Pain management: pain level controlled Vital Signs Assessment: post-procedure vital signs reviewed and stable Respiratory status: spontaneous breathing, nonlabored ventilation, respiratory function stable and patient connected to nasal cannula oxygen Cardiovascular status: blood pressure returned to baseline and stable Postop Assessment: no apparent nausea or vomiting Anesthetic complications: no   No notable events documented.  Last Vitals:  Vitals:   01/17/21 1319 01/17/21 1549  BP: 92/77 110/69  Pulse: 90 74  Resp: 16 18  Temp: 37.3 C 36.8 C  SpO2: 91%     Last Pain:  Vitals:   01/17/21 1553  TempSrc:   PainSc: [place]

## 2021-01-17 NOTE — Discharge Instructions (Signed)
 APPENDECTOMY POST OPERATIVE INSTRUCTIONS  Thinking Clearly  The anesthesia may cause you to feel different for 1 or 2 days. Do not drive, drink alcohol, or make any big decisions for at least 2 days.  Nutrition When you wake up, you will be able to drink small amounts of liquid. If you do not feel sick, you can slowly advance your diet to regular foods. Continue to drink lots of fluids, usually about 8 to 10 glasses per day. Eat a high-fiber diet so you don't strain during bowel movements. High-Fiber Foods Foods high in fiber include beans, bran cereals and whole-grain breads, peas, dried fruit (figs, apricots, and dates), raspberries, blackberries, strawberries, sweet corn, broccoli, baked potatoes with skin, plums, pears, apples, greens, and nuts. Activity Slowly increase your activity. Be sure to get up and walk every hour or so to prevent blood clots. No heavy lifting or strenuous activity for 4 weeks following surgery to prevent hernias at your incision sites It is normal to feel tired. You may need more sleep than usual.  Get your rest but make sure to get up and move around frequently to prevent blood clots and pneumonia.  Work and Return to School You can go back to work when you feel well enough. Discuss the timing with your surgeon. You can usually go back to school or work 1 week or less after an operation for an unruptured appendix and up to 2 weeks after a ruptured appendix. If your work requires heavy lifting or strenuous activity you need to be placed on light duty for 4 weeks following surgery. You can return to gym class, sports or other physical activities 4 weeks after surgery.  Wound Care Always wash your hands before and after touching near your incision site. Do not soak in a bathtub until cleared at your follow up appointment. You may take a shower 24 hours after surgery. A small amount of drainage from the incision is normal. If the drainage is thick and yellow or  the site is red, you may have an infection, so call your surgeon. If you have a drain in one of your incisions, it will be taken out in office when the drainage stops. Steri-Strips will fall off in 7 to 10 days or they will be removed during your first office visit. If you have dermabond glue covering over the incision, allow the glue to flake off on its own. Avoid wearing tight or rough clothing. It may rub your incisions and make it harder for them to heal. Protect the new skin, especially from the sun. The sun can burn and cause darker scarring. Your scar will heal in about 4 to 6 weeks and will become softer and continue to fade over the next year.  The cosmetic appearance of the incisions will improve over the course of the first year after surgery. Sensation around your incision will return in a few weeks or months.  Bowel Movements After intestinal surgery, you may have loose watery stools for several days. If watery diarrhea lasts longer than 3 days, contact your surgeon. Pain medication (narcotics) can cause constipation. Increase the fiber in your diet with high-fiber foods if you are constipated. You can take an over the counter stool softener like Colace to avoid constipation.  Additional over the counter medications can also be used if Colace isn't sufficient (for example, Milk of Magnesia or Miralax).  Pain The amount of pain is different for each person. Some people need only 1 to   3 doses of pain control medication, while others need more. Take alternating doses of tylenol and ibuprofen around the clock for the first five days following surgery.  This will provide a baseline of pain control and help with inflammation.  Take the narcotic pain medication in addition if needed for severe pain.  Contact Your Surgeon at [phone], if you have: Pain that will not go away Pain that gets worse A fever of more than 101F (38.3C) Repeated vomiting Swelling, redness, bleeding, or  bad-smelling drainage from your wound site Strong abdominal pain No bowel movement or unable to pass gas for 3 days Watery diarrhea lasting longer than 3 days  Pain Control The goal of pain control is to minimize pain, keep you moving and help you heal. Your surgical team will work with you on your pain plan. Most often a combination of therapies and medications are used to control your pain. You may also be given medication (local anesthetic) at the surgical site. This may help control your pain for several days. Extreme pain puts extra stress on your body at a time when your body needs to focus on healing. Do not wait until your pain has reached a level "10" or is unbearable before telling your doctor or nurse. It is much easier to control pain before it becomes severe. Following a laparoscopic procedure, pain is sometimes felt in the shoulder. This is due to the gas inserted into your abdomen during the procedure. Moving and walking helps to decrease the gas and the right shoulder pain.  Use the guide below for ways to manage your post-operative pain. Learn more by going to facs.org/safepaincontrol.  How Intense Is My Pain Common Therapies to Feel Better       I hardly notice my pain, and it does not interfere with my activities.  I notice my pain and it distracts me, but I can still do activities (sitting up, walking, standing).  Non-Medication Therapies  Ice (in a bag, applied over clothing at the surgical site), elevation, rest, meditation, massage, distraction (music, TV, play) walking and mild exercise Splinting the abdomen with pillows +  Non-Opioid Medications Acetaminophen (Tylenol) Non-steroidal anti-inflammatory drugs (NSAIDS) Aspirin, Ibuprofen (Motrin, Advil) Naproxen (Aleve) Take these as needed, when you feel pain. Both acetaminophen and NSAIDs help to decrease pain and swelling (inflammation).      My pain is hard to ignore and is more noticeable even when I  rest.  My pain interferes with my usual activities.  Non-Medication Therapies  +  Non-Opioid medications  Take on a regular schedule (around-the-clock) instead of as needed. (For example, Tylenol every 6 hours at 9:00 am, 3:00 pm, 9:00 pm, 3:00 am and Motrin every 6 hours at 12:00 am, 6:00 am, 12:00 pm, 6:00 pm)         I am focused on my pain, and I am not doing my daily activities.  I am groaning in pain, and I cannot sleep. I am unable to do anything.  My pain is as bad as it could be, and nothing else matters.  Non-Medication Therapies  +  Around-the-Clock Non-Opioid Medications  +  Short-acting opioids  Opioids should be used with other medications to manage severe pain. Opioids block pain and give a feeling of euphoria (feel high). Addiction, a serious side effect of opioids, is rare with short-term (a few days) use.  Examples of short-acting opioids include: Tramadol (Ultram), Hydrocodone (Norco, Vicodin), Hydromorphone (Dilaudid), Oxycodone (Oxycontin)     The above   directions have been adapted from the [employer] Surgical Patient Education Program.  Please refer to the ACS website if needed: https://www.facs.org/education/patient-education/patient-resources/operations/appendectomy.   Robert Hove, MD [facility] [address], [address], [place], [place]  [address] ?  [address] [address], [place], [place]   [address] [phone] ? [phone] ? FAX [phone] Web site: [email or web address]  

## 2021-01-17 NOTE — Anesthesia Procedure Notes (Signed)
Procedure Name: Intubation Date/Time: 01/17/2021 10:27 AM Performed by: Leonor Liv, CRNA Pre-anesthesia Checklist: Patient identified, Emergency Drugs available, Suction available and Patient being monitored Patient Re-evaluated:Patient Re-evaluated prior to induction Oxygen Delivery Method: Circle System Utilized Preoxygenation: Pre-oxygenation with 100% oxygen Induction Type: IV induction Ventilation: Mask ventilation without difficulty Laryngoscope Size: Mac and 4 Grade View: Grade I Tube type: Oral Tube size: 7.5 mm Number of attempts: 1 Airway Equipment and Method: Stylet and Oral airway Placement Confirmation: ETT inserted through vocal cords under direct vision, positive ETCO2 and breath sounds checked- equal and bilateral Secured at: 23 cm Tube secured with: Tape Dental Injury: Teeth and Oropharynx as per pre-operative assessment

## 2021-01-17 NOTE — Progress Notes (Signed)
PROGRESS NOTE    Robert Lang  AYT:[id number] DOB: 1950/03/30 DOA: 01/15/2021 PCP: Mackie Pai, [name]    Brief Narrative:  70 year old gentleman with history of COPD, interstitial pulmonary fibrosis, coronary artery disease, A. fib, alcohol abuse, GERD, hyperlipidemia presented with sudden onset of abdominal pain and found to have acute uncomplicated appendicitis.  Admitted with antibiotics and surgical consultation.   Assessment & Plan:   Active Problems:   Coronary artery disease   Essential hypertension   Dyslipidemia, goal LDL below 70   Smoking   COPD (chronic obstructive pulmonary disease) (HCC)   ETOH abuse   CAD (coronary artery disease)   Atrial fibrillation (HCC)   IPF (idiopathic pulmonary fibrosis) (HCC)   Appendicitis   Alcohol abuse   OSA (obstructive sleep apnea)   Sepsis ([place])  Acute appendicitis: Attempted conservative management with IV fluids and antibiotics.  Persistent pain. Underwent laparoscopic appendectomy today. Continue IV fluids.  Clears.  Advance as tolerated.  Mobility.  No further need for IV antibiotics.  Essential hypertension: Blood pressures are stable.  Resume home medications.  Coronary artery disease: Without any evidence of acute coronary syndrome.  Patient remains on Lipitor.  Resume.  Paroxysmal A. fib: On Xarelto at home.  On hold today.  No indication for heparin bridging. Probably can go back on Xarelto tomorrow.  Will defer to surgery.  Idiopathic pulmonary fibrosis: Currently stable.  Patient is on Esbriet at home.  He can take his own medications.    Obstructive sleep apnea: Uses CPAP at night.    DVT prophylaxis:   SCD.   Code Status: Full code Family Communication: Wife at the bedside. Disposition Plan: Status is: Inpatient  Remains inpatient appropriate because: Need for surgery today.        Consultants:  General surgery  Procedures:  None  Antimicrobials:  Rocephin and Flagyl  10/27---   Subjective: Seen and examined.  I examined him in the on the morning rounds before going to surgery.  He complained of persistent right-sided abdominal pain.  Denies any nausea or vomiting.  Had detailed discussion with surgery and he was ready to go to surgery. By the time this note was created, patient underwent uneventful laparoscopic appendectomy.   Objective: Vitals:   01/17/21 1225 01/17/21 1240 01/17/21 1256 01/17/21 1319  BP: 114/67 (!) 109/57 (!) 98/55 92/77  Pulse: 94 93 93 90  Resp: 20 (!) 24 19 16   Temp:   98.7 F (37.1 C) 99.1 F (37.3 C)  TempSrc:    Oral  SpO2: 95% 94% 93% 91%  Weight:      Height:        Intake/Output Summary (Last 24 hours) at 01/17/2021 1339 Last data filed at 01/17/2021 1226 Gross per 24 hour  Intake 1711.96 ml  Output 235 ml  Net 1476.96 ml   Filed Weights   01/15/21 1235  Weight: 85.5 kg    Examination:  General exam: Appears calm and comfortable.  Mildly anxious. Respiratory system: Clear to auscultation. Respiratory effort normal. Cardiovascular system: S1 & S2 heard, RRR. No JVD, murmurs, rubs, gallops or clicks. No pedal edema. Gastrointestinal system: Soft.  Nondistended.  Mildly tender on the right lower quadrant.  Bowel sounds present. Central nervous system: Alert and oriented. No focal neurological deficits.    Data Reviewed: I have personally reviewed following labs and imaging studies  CBC: Recent Labs  Lab 01/15/21 1255 01/16/21 0418  WBC 16.2* 17.7*  NEUTROABS  --  15.2*  HGB 15.7 14.0  HCT 46.4 42.2  MCV 97.1 99.3  PLT 212 798   Basic Metabolic Panel: Recent Labs  Lab 01/15/21 1255 01/16/21 0418  NA 133* 131*  K 4.1 3.9  CL 97* 98  CO2 26 25  GLUCOSE 87 124*  BUN 12 14  CREATININE 0.94 0.82  CALCIUM 9.4 8.5*  MG  --  2.0  PHOS  --  4.2   GFR: Estimated Creatinine Clearance: 89.3 mL/min (by C-G formula based on SCr of 0.82 mg/dL). Liver Function Tests: Recent Labs  Lab  01/15/21 1255 01/16/21 0418  AST 18 23  ALT 21 16  ALKPHOS 68 63  BILITOT 0.6 0.8  PROT 7.5 6.6  ALBUMIN 4.0 3.3*   Recent Labs  Lab 01/15/21 1255  LIPASE 50   No results for input(s): AMMONIA in the last 168 hours. Coagulation Profile: No results for input(s): INR, PROTIME in the last 168 hours. Cardiac Enzymes: No results for input(s): CKTOTAL, CKMB, CKMBINDEX, TROPONINI in the last 168 hours. BNP (last 3 results) Recent Labs    03/05/20 1343  PROBNP 109   HbA1C: No results for input(s): HGBA1C in the last 72 hours. CBG: No results for input(s): GLUCAP in the last 168 hours. Lipid Profile: No results for input(s): CHOL, HDL, LDLCALC, TRIG, CHOLHDL, LDLDIRECT in the last 72 hours. Thyroid Function Tests: Recent Labs    01/16/21 0140  TSH 1.232   Anemia Panel: No results for input(s): VITAMINB12, FOLATE, FERRITIN, TIBC, IRON, RETICCTPCT in the last 72 hours. Sepsis Labs: Recent Labs  Lab 01/16/21 0140 01/16/21 0418 01/16/21 1146  PROCALCITON  --  0.48  --   LATICACIDVEN 1.6  --  2.3*    Recent Results (from the past 240 hour(s))  Resp Panel by RT-PCR (Flu A&B, Covid) Nasopharyngeal Swab     Status: None   Collection Time: 01/15/21  6:46 PM   Specimen: Nasopharyngeal Swab; Nasopharyngeal(NP) swabs in vial transport medium  Result Value Ref Range Status   SARS Coronavirus 2 by RT PCR NEGATIVE NEGATIVE Final    Comment: (NOTE) SARS-CoV-2 target nucleic acids are NOT DETECTED.  The SARS-CoV-2 RNA is generally detectable in upper respiratory specimens during the acute phase of infection. The lowest concentration of SARS-CoV-2 viral copies this assay can detect is 138 copies/mL. A negative result does not preclude SARS-Cov-2 infection and should not be used as the sole basis for treatment or other patient management decisions. A negative result may occur with  improper specimen collection/handling, submission of specimen other than nasopharyngeal swab,  presence of viral mutation(s) within the areas targeted by this assay, and inadequate number of viral copies(<138 copies/mL). A negative result must be combined with clinical observations, patient history, and epidemiological information. The expected result is Negative.  Fact Sheet for Patients:  EntrepreneurPulse.com.au  Fact Sheet for Healthcare Providers:  IncredibleEmployment.be  This test is no t yet approved or cleared by the [place] FDA and  has been authorized for detection and/or diagnosis of SARS-CoV-2 by FDA under an Emergency Use Authorization (EUA). This EUA will remain  in effect (meaning this test can be used) for the duration of the COVID-19 declaration under Section 564(b)(1) of the Act, 21 U.S.C.section 360bbb-3(b)(1), unless the authorization is terminated  or revoked sooner.       Influenza A by PCR NEGATIVE NEGATIVE Final   Influenza B by PCR NEGATIVE NEGATIVE Final    Comment: (NOTE) The Xpert Xpress SARS-CoV-2/FLU/RSV plus assay is intended as an aid in the diagnosis of  influenza from Nasopharyngeal swab specimens and should not be used as a sole basis for treatment. Nasal washings and aspirates are unacceptable for Xpert Xpress SARS-CoV-2/FLU/RSV testing.  Fact Sheet for Patients: EntrepreneurPulse.com.au  Fact Sheet for Healthcare Providers: IncredibleEmployment.be  This test is not yet approved or cleared by the [place] FDA and has been authorized for detection and/or diagnosis of SARS-CoV-2 by FDA under an Emergency Use Authorization (EUA). This EUA will remain in effect (meaning this test can be used) for the duration of the COVID-19 declaration under Section 564(b)(1) of the Act, 21 U.S.C. section 360bbb-3(b)(1), unless the authorization is terminated or revoked.  Performed at [facility], [address] [place], [place] [address]   Culture,  blood (x 2)     Status: None (Preliminary result)   Collection Time: 01/16/21  1:30 AM   Specimen: BLOOD LEFT FOREARM  Result Value Ref Range Status   Specimen Description BLOOD LEFT FOREARM  Final   Special Requests   Final    BOTTLES DRAWN AEROBIC AND ANAEROBIC Blood Culture adequate volume   Culture   Final    NO GROWTH 1 DAY Performed at [facility], [place] [address], [place], [place] [address]    Report Status PENDING  Incomplete  Culture, blood (x 2)     Status: None (Preliminary result)   Collection Time: 01/16/21  1:40 AM   Specimen: BLOOD LEFT HAND  Result Value Ref Range Status   Specimen Description BLOOD LEFT HAND  Final   Special Requests   Final    BOTTLES DRAWN AEROBIC AND ANAEROBIC Blood Culture adequate volume   Culture   Final    NO GROWTH 1 DAY Performed at [facility], [place] [address] [place], [place] [address]    Report Status PENDING  Incomplete         Radiology Studies: CT ABDOMEN PELVIS W CONTRAST  Result Date: 01/15/2021 CLINICAL DATA:  Right lower quadrant pain. EXAM: CT ABDOMEN AND PELVIS WITH CONTRAST TECHNIQUE: Multidetector CT imaging of the abdomen and pelvis was performed using the standard protocol following bolus administration of intravenous contrast. CONTRAST:  186mL OMNIPAQUE IOHEXOL 300 MG/ML  SOLN COMPARISON:  None. FINDINGS: Lower chest: There is atelectasis in the lung bases. Hepatobiliary: There is a hypodensity in the right lobe of the liver which is too small to characterize measuring 12 mm, possibly a complex and indeterminate. Gallbladder and bile ducts are within normal limits. Pancreas: Unremarkable. No pancreatic ductal dilatation or surrounding inflammatory changes. Spleen: Normal in size without focal abnormality. Adrenals/Urinary Tract: Adrenal glands are unremarkable. Kidneys are normal, without renal calculi, focal lesion, or hydronephrosis. Bladder is unremarkable. Stomach/Bowel: The tip of the appendix is prominent  in size measuring 9 mm in there is mild surrounding inflammatory stranding. Proximal appendix is within normal limits. No bowel obstruction or free air. There is colonic diverticulosis without evidence for diverticulitis. Small bowel and stomach are within normal limits. Vascular/Lymphatic: Aortic atherosclerosis. No enlarged abdominal or pelvic lymph nodes. Reproductive: Prostate is unremarkable. Other: No abdominal wall hernia or abnormality. No abdominopelvic ascites. Musculoskeletal: Multilevel degenerative changes affect the spine. Vertebroplasty changes are seen at T12. Laminectomy defects are seen at L3 and L4. IMPRESSION: 1. Findings concerning for early acute tip appendicitis. No perforation or abscess. 2. Colonic diverticulosis without evidence for acute diverticulitis. 3. Small hypodense lesion in the liver is indeterminate. Recommend further evaluation with MRI. Electronically Signed   By: Tina Griffiths.D.  On: 01/15/2021 18:22   DG CHEST PORT 1 VIEW  Result Date: 01/15/2021 CLINICAL DATA:  Preoperative chest exam for appendectomy. EXAM: PORTABLE CHEST 1 VIEW COMPARISON:  Chest x-ray 10/19/2019. FINDINGS: There is linear atelectasis or scarring in the left lung base. There is no pleural effusion or pneumothorax. Cardiomediastinal silhouette is within normal limits. No acute fractures are seen. IMPRESSION: Left basilar atelectasis/airspace disease. Electronically Signed   By: Ronney Asters M.D.   On: 01/15/2021 22:54        Scheduled Meds:  acetaminophen  650 mg Oral Q6H   docusate sodium  100 mg Oral BID   famotidine  40 mg Oral QHS   sertraline  200 mg Oral QHS   traZODone  50 mg Oral QHS   vitamin B-12  1,000 mcg Oral Daily   Continuous Infusions:     LOS: 2 days    Time spent: 30 minutes    Barb Merino, MD [facility] Pager [phone]

## 2021-01-17 NOTE — Anesthesia Preprocedure Evaluation (Signed)
Anesthesia Evaluation  Patient identified by MRN, date of birth, ID band Patient awake    Reviewed: Allergy & Precautions, H&P , NPO status , Patient's Chart, lab work & pertinent test results  Airway Mallampati: II   Neck ROM: full    Dental   Pulmonary sleep apnea , COPD, former smoker,    breath sounds clear to auscultation       Cardiovascular hypertension, + CAD and + Past MI  + dysrhythmias Atrial Fibrillation  Rhythm:regular Rate:Normal     Neuro/Psych PSYCHIATRIC DISORDERS Anxiety Depression    GI/Hepatic GERD  ,(+)     substance abuse  alcohol use,   Endo/Other    Renal/GU      Musculoskeletal  (+) Arthritis ,   Abdominal   Peds  Hematology   Anesthesia Other Findings   Reproductive/Obstetrics                             Anesthesia Physical Anesthesia Plan  ASA: 3  Anesthesia Plan: General   Post-op Pain Management:    Induction: Intravenous  PONV Risk Score and Plan: 2 and Ondansetron, Dexamethasone, Midazolam and Treatment may vary due to age or medical condition  Airway Management Planned: Oral ETT  Additional Equipment:   Intra-op Plan:   Post-operative Plan: Extubation in OR  Informed Consent: I have reviewed the patients History and Physical, chart, labs and discussed the procedure including the risks, benefits and alternatives for the proposed anesthesia with the patient or authorized representative who has indicated his/her understanding and acceptance.     Dental advisory given  Plan Discussed with: CRNA, Anesthesiologist and Surgeon  Anesthesia Plan Comments:         Anesthesia Quick Evaluation

## 2021-01-17 NOTE — Transfer of Care (Signed)
Immediate Anesthesia Transfer of Care Note  Patient: Hale Chalfin  Procedure(s) Performed: APPENDECTOMY LAPAROSCOPIC (Abdomen)  Patient Location: PACU  Anesthesia Type:General  Level of Consciousness: confused  Airway & Oxygen Therapy: Patient Spontanous Breathing and Patient connected to face mask oxygen  Post-op Assessment: Report given to RN and Post -op Vital signs reviewed and stable  Post vital signs: Reviewed and stable  Last Vitals:  Vitals Value Taken Time  BP 114/67 01/17/21 1225  Temp    Pulse 93 01/17/21 1232  Resp 25 01/17/21 1232  SpO2 94 % 01/17/21 1232  Vitals shown include unvalidated device data.  Last Pain:  Vitals:   01/17/21 0909  TempSrc: Oral  PainSc:          Complications: No notable events documented.

## 2021-01-18 ENCOUNTER — Encounter (HOSPITAL_COMMUNITY): Payer: Self-pay | Admitting: Surgery

## 2021-01-18 DIAGNOSIS — K353 Acute appendicitis with localized peritonitis, without perforation or gangrene: Secondary | ICD-10-CM | POA: Diagnosis not present

## 2021-01-18 LAB — BASIC METABOLIC PANEL
Anion gap: 7 (ref 5–15)
BUN: 9 mg/dL (ref 8–23)
CO2: 22 mmol/L (ref 22–32)
Calcium: 8.4 mg/dL — ABNORMAL LOW (ref 8.9–10.3)
Chloride: 104 mmol/L (ref 98–111)
Creatinine, Ser: 0.72 mg/dL (ref 0.61–1.24)
GFR, Estimated: >60 mL/min (ref >60)
Glucose, Bld: 125 mg/dL — ABNORMAL HIGH (ref 70–99)
Potassium: 4 mmol/L (ref 3.5–5.1)
Sodium: 133 mmol/L — ABNORMAL LOW (ref 135–145)

## 2021-01-18 LAB — URINALYSIS, ROUTINE W REFLEX MICROSCOPIC
Bacteria, UA: NONE SEEN
Bilirubin Urine: NEGATIVE
Glucose, UA: NEGATIVE mg/dL
Hgb urine dipstick: NEGATIVE
Ketones, ur: NEGATIVE mg/dL
Leukocytes,Ua: NEGATIVE
Nitrite: POSITIVE — AB
Protein, ur: NEGATIVE mg/dL
Specific Gravity, Urine: 1.018 (ref 1.005–1.030)
pH: 5 (ref 5.0–8.0)

## 2021-01-18 LAB — CBC
HCT: 39.1 % (ref 39.0–52.0)
Hemoglobin: 12.7 g/dL — ABNORMAL LOW (ref 13.0–17.0)
MCH: 32.4 pg (ref 26.0–34.0)
MCHC: 32.5 g/dL (ref 30.0–36.0)
MCV: 99.7 fL (ref 80.0–100.0)
Platelets: 170 10*3/uL (ref 150–400)
RBC: 3.92 MIL/uL — ABNORMAL LOW (ref 4.22–5.81)
RDW: 13.9 % (ref 11.5–15.5)
WBC: 7.8 10*3/uL (ref 4.0–10.5)
nRBC: 0 % (ref 0.0–0.2)

## 2021-01-18 MED ORDER — RIVAROXABAN 20 MG PO TABS
20.0000 mg | ORAL_TABLET | Freq: Every day | ORAL | Status: DC
  Administered 2021-01-18 – 2021-01-19 (×2): 20 mg via ORAL
  Filled 2021-01-18 (×2): qty 1

## 2021-01-18 NOTE — Progress Notes (Signed)
Progress Note  1 Day Post-Op  Subjective: Doing well POD1.  Pain around incisions.  Wants food.  Objective: Vital signs in last 24 hours: Temp:  [97.5 F (36.4 C)-99.1 F (37.3 C)] 97.5 F (36.4 C) (10/30 0423) Pulse Rate:  [74-99] 83 (10/30 0423) Resp:  [16-24] 17 (10/30 0423) BP: (92-128)/(55-77) 123/67 (10/30 0423) SpO2:  [91 %-95 %] 95 % (10/30 0423)    Intake/Output from previous day: 10/29 0701 - 10/30 0700 In: 1040 [P.O.:240; I.V.:800] Out: 730 [Urine:600; Drains:120; Blood:10] Intake/Output this shift: No intake/output data recorded.  PE: General: pleasant, WD, overweight male who is laying in bed in NAD Heart: regular, rate, and rhythm.   Lungs: Respiratory effort nonlabored Abd: incisions c/d/I w/ glue, JP serosanguinous Skin: warm and dry with no masses, lesions, or rashes    Lab Results:  Recent Labs    01/16/21 0418 01/18/21 0418  WBC 17.7* 7.8  HGB 14.0 12.7*  HCT 42.2 39.1  PLT 193 170    BMET Recent Labs    01/16/21 0418 01/18/21 0418  NA 131* 133*  K 3.9 4.0  CL 98 104  CO2 25 22  GLUCOSE 124* 125*  BUN 14 9  CREATININE 0.82 0.72  CALCIUM 8.5* 8.4*    PT/INR No results for input(s): LABPROT, INR in the last 72 hours. CMP     Component Value Date/Time   NA 133 (L) 01/18/2021 0418   NA 137 03/05/2020 1343   K 4.0 01/18/2021 0418   CL 104 01/18/2021 0418   CO2 22 01/18/2021 0418   GLUCOSE 125 (H) 01/18/2021 0418   BUN 9 01/18/2021 0418   BUN 14 03/05/2020 1343   CREATININE 0.72 01/18/2021 0418   CREATININE 0.95 01/25/2020 1134   CALCIUM 8.4 (L) 01/18/2021 0418   PROT 6.6 01/16/2021 0418   PROT 6.9 04/20/2019 0807   ALBUMIN 3.3 (L) 01/16/2021 0418   ALBUMIN 4.3 04/20/2019 0807   AST 23 01/16/2021 0418   ALT 16 01/16/2021 0418   ALKPHOS 63 01/16/2021 0418   BILITOT 0.8 01/16/2021 0418   BILITOT 0.5 04/20/2019 0807   GFRNONAA >60 01/18/2021 0418   GFRAA 81 03/05/2020 1343   Lipase     Component Value Date/Time    LIPASE 50 01/15/2021 1255       Studies/Results: No results found.  Anti-infectives: Anti-infectives (From admission, onward)    Start     Dose/Rate Route Frequency Ordered Stop   01/16/21 1600  cefTRIAXone (ROCEPHIN) 2 g in sodium chloride 0.9 % 100 mL IVPB  Status:  Discontinued        2 g 200 mL/hr over 30 Minutes Intravenous Every 24 hours 01/16/21 0027 01/17/21 1319   01/16/21 0400  metroNIDAZOLE (FLAGYL) IVPB 500 mg  Status:  Discontinued        500 mg 100 mL/hr over 60 Minutes Intravenous Every 8 hours 01/16/21 0027 01/17/21 1319   01/15/21 1845  cefTRIAXone (ROCEPHIN) 2 g in sodium chloride 0.9 % 100 mL IVPB       See Hyperspace for full Linked Orders Report.   2 g 200 mL/hr over 30 Minutes Intravenous  Once 01/15/21 1835 01/15/21 1918   01/15/21 1845  metroNIDAZOLE (FLAGYL) IVPB 500 mg       See Hyperspace for full Linked Orders Report.   500 mg 100 mL/hr over 60 Minutes Intravenous  Once 01/15/21 1835 01/15/21 2032        Assessment/Plan Early appendicitis  - WBC 7 - Stop  antibiotics - Diet as tolerated - Follow up with me in 10-14 days for drain removal - Okay for discharge from general surgery standpoint - Okay to restart anticoagulation  Hx of STEMI 2020 OSA ILD COPD A. Fib on xarelto  HTN HLD GERD Depression/Anxiety    LOS: 3 days    Felicie Morn, MD [facility] Surgery 01/18/2021, 9:12 AM Please see Amion for pager number during day hours 7:00am-4:30pm

## 2021-01-18 NOTE — Progress Notes (Signed)
PROGRESS NOTE    Robert Lang  CLE:[id number] DOB: 05-07-1950 DOA: 01/15/2021 PCP: Mackie Pai, [name]    Brief Narrative:  70 year old gentleman with history of COPD, interstitial pulmonary fibrosis, coronary artery disease, A. fib, alcohol abuse, GERD, hyperlipidemia presented with sudden onset of abdominal pain and found to have acute uncomplicated appendicitis.  Admitted with antibiotics and surgical consultation. Laparoscopic appendectomy 10/29.   Assessment & Plan:   Active Problems:   Coronary artery disease   Essential hypertension   Dyslipidemia, goal LDL below 70   Smoking   COPD (chronic obstructive pulmonary disease) (HCC)   ETOH abuse   CAD (coronary artery disease)   Atrial fibrillation (HCC)   IPF (idiopathic pulmonary fibrosis) (HCC)   Appendicitis   Alcohol abuse   OSA (obstructive sleep apnea)   Sepsis ([place])  Acute appendicitis: Attempted conservative management with IV fluids and antibiotics.  Persistent pain. Underwent laparoscopic appendectomy 10/29. Advance diet today.  Mobility.  Essential hypertension: Blood pressures are stable.  Home medications were resumed.  Coronary artery disease: Without any evidence of acute coronary syndrome.  Patient remains on Lipitor.  Resume.  Paroxysmal A. fib: On Xarelto at home.  Resume today as per surgery plan.  Idiopathic pulmonary fibrosis: Currently stable.  Patient is on Esbriet at home.  He can take his own medications.    Obstructive sleep apnea: Uses CPAP at night.  Mobilize.  Challenge with regular diet.  Wait for bowel movements.  Anticipate discharge home tomorrow.    DVT prophylaxis:   SCD. rivaroxaban (XARELTO) tablet 20 mg   Code Status: Full code Family Communication: None. Disposition Plan: Status is: Inpatient  Remains inpatient appropriate because: Abdominal pain.  Using IV pain medication for postop pain control.        Consultants:  General surgery  Procedures:   None  Antimicrobials:  Rocephin and Flagyl 10/27---10/29   Subjective:  Patient seen and examined.  He is more quiet and composed today.  Has some soreness and used IV Dilaudid earlier today.  Denies any nausea vomiting.  He was on liquid diet in the morning and did well.  No bowel movement.  No flatus yet. Today he tells me that he is not rushing to go home, he would rather go home when we tell him to go.  I encouraged him to mobilize more today in anticipation of going home tomorrow.   Objective: Vitals:   01/17/21 1549 01/17/21 2025 01/18/21 0423 01/18/21 1010  BP: 110/69 128/74 123/67 117/73  Pulse: 74 99 83 89  Resp: 18 17 17 18   Temp: 98.2 F (36.8 C) 97.6 F (36.4 C) (!) 97.5 F (36.4 C) (!) 97.5 F (36.4 C)  TempSrc: Axillary Oral Oral Oral  SpO2:  93% 95% 95%  Weight:      Height:        Intake/Output Summary (Last 24 hours) at 01/18/2021 1217 Last data filed at 01/18/2021 0625 Gross per 24 hour  Intake 1040 ml  Output 495 ml  Net 545 ml   Filed Weights   01/15/21 1235  Weight: 85.5 kg    Examination:  General exam: Appears calm and comfortable.  On room air.  Sitting in couch. Respiratory system: Clear to auscultation. Respiratory effort normal. Cardiovascular system: S1 & S2 heard, RRR. No JVD, murmurs, rubs, gallops or clicks. No pedal edema. Gastrointestinal system: Soft.  Mild midline tenderness.  He has JP drain with serosanguineous discharge.  Bowel sounds present. Central nervous system: Alert and oriented. No  focal neurological deficits.    Data Reviewed: I have personally reviewed following labs and imaging studies  CBC: Recent Labs  Lab 01/15/21 1255 01/16/21 0418 01/18/21 0418  WBC 16.2* 17.7* 7.8  NEUTROABS  --  15.2*  --   HGB 15.7 14.0 12.7*  HCT 46.4 42.2 39.1  MCV 97.1 99.3 99.7  PLT 212 193 314   Basic Metabolic Panel: Recent Labs  Lab 01/15/21 1255 01/16/21 0418 01/18/21 0418  NA 133* 131* 133*  K 4.1 3.9 4.0  CL  97* 98 104  CO2 26 25 22   GLUCOSE 87 124* 125*  BUN 12 14 9   CREATININE 0.94 0.82 0.72  CALCIUM 9.4 8.5* 8.4*  MG  --  2.0  --   PHOS  --  4.2  --    GFR: Estimated Creatinine Clearance: 91.5 mL/min (by C-G formula based on SCr of 0.72 mg/dL). Liver Function Tests: Recent Labs  Lab 01/15/21 1255 01/16/21 0418  AST 18 23  ALT 21 16  ALKPHOS 68 63  BILITOT 0.6 0.8  PROT 7.5 6.6  ALBUMIN 4.0 3.3*   Recent Labs  Lab 01/15/21 1255  LIPASE 50   No results for input(s): AMMONIA in the last 168 hours. Coagulation Profile: No results for input(s): INR, PROTIME in the last 168 hours. Cardiac Enzymes: No results for input(s): CKTOTAL, CKMB, CKMBINDEX, TROPONINI in the last 168 hours. BNP (last 3 results) Recent Labs    03/05/20 1343  PROBNP 109   HbA1C: No results for input(s): HGBA1C in the last 72 hours. CBG: No results for input(s): GLUCAP in the last 168 hours. Lipid Profile: No results for input(s): CHOL, HDL, LDLCALC, TRIG, CHOLHDL, LDLDIRECT in the last 72 hours. Thyroid Function Tests: Recent Labs    01/16/21 0140  TSH 1.232   Anemia Panel: No results for input(s): VITAMINB12, FOLATE, FERRITIN, TIBC, IRON, RETICCTPCT in the last 72 hours. Sepsis Labs: Recent Labs  Lab 01/16/21 0140 01/16/21 0418 01/16/21 1146  PROCALCITON  --  0.48  --   LATICACIDVEN 1.6  --  2.3*    Recent Results (from the past 240 hour(s))  Resp Panel by RT-PCR (Flu A&B, Covid) Nasopharyngeal Swab     Status: None   Collection Time: 01/15/21  6:46 PM   Specimen: Nasopharyngeal Swab; Nasopharyngeal(NP) swabs in vial transport medium  Result Value Ref Range Status   SARS Coronavirus 2 by RT PCR NEGATIVE NEGATIVE Final    Comment: (NOTE) SARS-CoV-2 target nucleic acids are NOT DETECTED.  The SARS-CoV-2 RNA is generally detectable in upper respiratory specimens during the acute phase of infection. The lowest concentration of SARS-CoV-2 viral copies this assay can detect is 138  copies/mL. A negative result does not preclude SARS-Cov-2 infection and should not be used as the sole basis for treatment or other patient management decisions. A negative result may occur with  improper specimen collection/handling, submission of specimen other than nasopharyngeal swab, presence of viral mutation(s) within the areas targeted by this assay, and inadequate number of viral copies(<138 copies/mL). A negative result must be combined with clinical observations, patient history, and epidemiological information. The expected result is Negative.  Fact Sheet for Patients:  EntrepreneurPulse.com.au  Fact Sheet for Healthcare Providers:  IncredibleEmployment.be  This test is no t yet approved or cleared by the [place] FDA and  has been authorized for detection and/or diagnosis of SARS-CoV-2 by FDA under an Emergency Use Authorization (EUA). This EUA will remain  in effect (meaning this test can be  used) for the duration of the COVID-19 declaration under Section 564(b)(1) of the Act, 21 U.S.C.section 360bbb-3(b)(1), unless the authorization is terminated  or revoked sooner.       Influenza A by PCR NEGATIVE NEGATIVE Final   Influenza B by PCR NEGATIVE NEGATIVE Final    Comment: (NOTE) The Xpert Xpress SARS-CoV-2/FLU/RSV plus assay is intended as an aid in the diagnosis of influenza from Nasopharyngeal swab specimens and should not be used as a sole basis for treatment. Nasal washings and aspirates are unacceptable for Xpert Xpress SARS-CoV-2/FLU/RSV testing.  Fact Sheet for Patients: EntrepreneurPulse.com.au  Fact Sheet for Healthcare Providers: IncredibleEmployment.be  This test is not yet approved or cleared by the [place] FDA and has been authorized for detection and/or diagnosis of SARS-CoV-2 by FDA under an Emergency Use Authorization (EUA). This EUA will remain in effect (meaning  this test can be used) for the duration of the COVID-19 declaration under Section 564(b)(1) of the Act, 21 U.S.C. section 360bbb-3(b)(1), unless the authorization is terminated or revoked.  Performed at [facility], [address] [place], [place] [address]   Culture, blood (x 2)     Status: None (Preliminary result)   Collection Time: 01/16/21  1:30 AM   Specimen: BLOOD LEFT FOREARM  Result Value Ref Range Status   Specimen Description BLOOD LEFT FOREARM  Final   Special Requests   Final    BOTTLES DRAWN AEROBIC AND ANAEROBIC Blood Culture adequate volume   Culture   Final    NO GROWTH 2 DAYS Performed at [facility], [place] [address] [place], [place] [address]    Report Status PENDING  Incomplete  Culture, blood (x 2)     Status: None (Preliminary result)   Collection Time: 01/16/21  1:40 AM   Specimen: BLOOD LEFT HAND  Result Value Ref Range Status   Specimen Description BLOOD LEFT HAND  Final   Special Requests   Final    BOTTLES DRAWN AEROBIC AND ANAEROBIC Blood Culture adequate volume   Culture   Final    NO GROWTH 2 DAYS Performed at [facility], [place] [address], [place], [place] [address]    Report Status PENDING  Incomplete         Radiology Studies: No results found.      Scheduled Meds:  acetaminophen  650 mg Oral Q6H   buPROPion  300 mg Oral Daily   busPIRone  7.5 mg Oral BID   darifenacin  7.5 mg Oral Daily   docusate sodium  100 mg Oral BID   famotidine  20 mg Oral QHS   finasteride  5 mg Oral Daily   gabapentin  400 mg Oral QHS   phenazopyridine  100 mg Oral TID WC   Pirfenidone  801 mg Oral TID   ranolazine  1,000 mg Oral BID   rivaroxaban  20 mg Oral Daily   sertraline  200 mg Oral QHS   traZODone  50 mg Oral QHS   vitamin B-12  1,000 mcg Oral Daily   Continuous Infusions:     LOS: 3 days    Time spent: 30 minutes    Barb Merino, MD [facility] Pager [phone]

## 2021-01-19 ENCOUNTER — Other Ambulatory Visit (HOSPITAL_COMMUNITY): Payer: Self-pay

## 2021-01-19 DIAGNOSIS — K353 Acute appendicitis with localized peritonitis, without perforation or gangrene: Secondary | ICD-10-CM | POA: Diagnosis not present

## 2021-01-19 LAB — BASIC METABOLIC PANEL
Anion gap: 5 (ref 5–15)
BUN: 8 mg/dL (ref 8–23)
CO2: 27 mmol/L (ref 22–32)
Calcium: 8.6 mg/dL — ABNORMAL LOW (ref 8.9–10.3)
Chloride: 102 mmol/L (ref 98–111)
Creatinine, Ser: 0.76 mg/dL (ref 0.61–1.24)
GFR, Estimated: >60 mL/min (ref >60)
Glucose, Bld: 141 mg/dL — ABNORMAL HIGH (ref 70–99)
Potassium: 3.9 mmol/L (ref 3.5–5.1)
Sodium: 134 mmol/L — ABNORMAL LOW (ref 135–145)

## 2021-01-19 LAB — CBC
HCT: 40.4 % (ref 39.0–52.0)
Hemoglobin: 13.1 g/dL (ref 13.0–17.0)
MCH: 32.4 pg (ref 26.0–34.0)
MCHC: 32.4 g/dL (ref 30.0–36.0)
MCV: 100 fL (ref 80.0–100.0)
Platelets: 187 10*3/uL (ref 150–400)
RBC: 4.04 MIL/uL — ABNORMAL LOW (ref 4.22–5.81)
RDW: 13.5 % (ref 11.5–15.5)
WBC: 5.2 10*3/uL (ref 4.0–10.5)
nRBC: 0 % (ref 0.0–0.2)

## 2021-01-19 MED ORDER — ACETAMINOPHEN 325 MG PO TABS: 650.0000 mg | ORAL_TABLET | Freq: Four times a day (QID) | ORAL | Status: DC | PRN

## 2021-01-19 MED ORDER — OXYCODONE HCL 10 MG PO TABS
10.0000 mg | ORAL_TABLET | Freq: Four times a day (QID) | ORAL | 0 refills | Status: DC | PRN
  Filled 2021-01-19: qty 28, 7d supply, fill #0

## 2021-01-19 NOTE — Telephone Encounter (Signed)
Addressed in other note.

## 2021-01-19 NOTE — Plan of Care (Signed)

## 2021-01-19 NOTE — Care Management Important Message (Signed)
Important Message  Patient Details  Name: Robert Lang MRN: [id number] Date of Birth: 07-Sep-1950   Medicare Important Message Given:  Yes     [name] 01/19/2021, 2:30 PM

## 2021-01-19 NOTE — Discharge Summary (Signed)
Physician Discharge Summary  Lemon Sternberg YQM:[id number] DOB: Apr 07, 1950 DOA: 01/15/2021  PCP: Mackie Pai, [name]  Admit date: 01/15/2021 Discharge date: 01/19/2021  Admitted From: Home Disposition: Home  Recommendations for Outpatient Follow-up:  Follow up with PCP in 1-2 weeks Surgery will schedule follow-up.  Home Health: N/A Equipment/Devices: N/A  Discharge Condition: Stable CODE STATUS: Full code Diet recommendation: Low-salt diet  Discharge summary: 70 year old gentleman with history of COPD, interstitial pulmonary fibrosis, coronary artery disease, A. fib, alcohol abuse, GERD, hyperlipidemia presented with sudden onset of abdominal pain and found to have acute uncomplicated appendicitis.  Admitted with antibiotics and surgical consultation.  Failed conservative management.  Underwent laparoscopic appendectomy on 10/29.     Acute appendicitis: Attempted conservative management with IV fluids and antibiotics.  Persistent pain. Underwent laparoscopic appendectomy 10/29.  Clinically improved.  Surgically cleared as per surgery. Tolerating regular diet with return of bowel function. Discharging home with adequate pain medications.  Mobility.  Patient is going home with drain bulb with instructions to empty.  Surgery will schedule outpatient follow-up in the office to reassess before removal.   Essential hypertension History of coronary artery disease Paroxysmal A. fib on Xarelto Idiopathic pulmonary fibrosis on Esbriet Sleep apnea on CPAP  Chronic medical issues remained stable.  He will resume all his medications.  He will start using CPAP at night.  Dysuria: Patient had urinary retention perioperative, complaint of burning and dysuria after removal of Foley catheter.  UA was positive for some nitrites but negative for RBC or WBC.  No evidence of UTI.  Probably irritation due to Foley catheter.  She will continue to drink plenty of water.  Discharge Diagnoses:   Active Problems:   Coronary artery disease   Essential hypertension   Dyslipidemia, goal LDL below 70   Smoking   COPD (chronic obstructive pulmonary disease) (HCC)   ETOH abuse   CAD (coronary artery disease)   Atrial fibrillation (HCC)   IPF (idiopathic pulmonary fibrosis) (HCC)   Appendicitis   Alcohol abuse   OSA (obstructive sleep apnea)   Sepsis [facility])    Discharge Instructions  Discharge Instructions     Call MD for:  redness, tenderness, or signs of infection (pain, swelling, redness, odor or green/yellow discharge around incision site)   Complete by: As directed    Call MD for:  severe uncontrolled pain   Complete by: As directed    Diet - low sodium heart healthy   Complete by: As directed    Increase activity slowly   Complete by: As directed    Leave dressing on - Keep it clean, dry, and intact until clinic visit   Complete by: As directed       Allergies as of 01/19/2021       Reactions   Naproxen Other (See Comments)   (Naprosyn *ANALGESICS - ANTI-INFLAMMATORY*) Nausea, Abdominal pain        Medication List     TAKE these medications    acetaminophen 325 MG tablet Commonly known as: TYLENOL Take 2 tablets (650 mg total) by mouth every 6 (six) hours as needed. What changed:  medication strength how much to take reasons to take this   atorvastatin 80 MG tablet Commonly known as: LIPITOR TAKE 1 TABLET(80 MG) BY MOUTH DAILY AT 6 PM What changed: See the new instructions.   buPROPion 300 MG 24 hr tablet Commonly known as: WELLBUTRIN XL Take 1 tablet (300 mg total) by mouth daily.   busPIRone 7.5 MG tablet Commonly known  as: BUSPAR Take 1 tablet (7.5 mg total) by mouth 2 (two) times daily.   Cholecalciferol 25 MCG (1000 UT) tablet Take 1,000 Units by mouth daily.   docusate sodium 100 MG capsule Commonly known as: COLACE Take 100 mg by mouth at bedtime.   enalapril 2.5 MG tablet Commonly known as: VASOTEC Take 2.5 mg by mouth  daily.   famotidine 40 MG tablet Commonly known as: PEPCID TAKE 1 TABLET(40 MG) BY MOUTH DAILY What changed: See the new instructions.   fexofenadine 180 MG tablet Commonly known as: ALLEGRA Take 180 mg by mouth at bedtime.   finasteride 5 MG tablet Commonly known as: PROSCAR Take 1 tablet (5 mg total) by mouth daily. What changed: when to take this   gabapentin 100 MG capsule Commonly known as: NEURONTIN 4 tab po q hs. What changed:  how much to take how to take this when to take this additional instructions   Ginger Root 550 MG Caps Take 550 mg by mouth daily.   ibuprofen 200 MG tablet Commonly known as: ADVIL Take 200 mg by mouth every 6 (six) hours as needed for headache or moderate pain.   melatonin 5 MG Tabs Take 10 mg by mouth at bedtime.   MIRALAX PO Take 17 g by mouth at bedtime.   nitroGLYCERIN 0.4 MG SL tablet Commonly known as: NITROSTAT Place 1 tablet (0.4 mg total) under the tongue every 5 (five) minutes as needed for chest pain.   Oxycodone HCl 10 MG Tabs Take 1 tablet (10 mg total) by mouth every 6 (six) hours as needed for severe pain (not relieved by tylenol).   pantoprazole 40 MG tablet Commonly known as: PROTONIX Take 40 mg by mouth daily.   Pirfenidone 801 MG Tabs Commonly known as: Esbriet Take 801 mg by mouth 3 (three) times daily.   PRESERVISION AREDS PO Take 1 capsule by mouth in the morning and at bedtime.   ranolazine 1000 MG SR tablet Commonly known as: RANEXA Take 1 tablet (1,000 mg total) by mouth 2 (two) times daily.   sertraline 100 MG tablet Commonly known as: ZOLOFT Take 200 mg by mouth at bedtime.   solifenacin 5 MG tablet Commonly known as: VESICARE Take 5 mg by mouth at bedtime.   traZODone 50 MG tablet Commonly known as: DESYREL TAKE 1/2 TO 1 TABLET(25 TO 50 MG) BY MOUTH AT BEDTIME AS NEEDED FOR SLEEP What changed: See the new instructions.   Turmeric 500 MG Caps Take 1 capsule by mouth 2 (two) times  daily.   vitamin B-12 1000 MCG tablet Commonly known as: CYANOCOBALAMIN Take 1,000 mcg by mouth daily.   Xarelto 20 MG Tabs tablet Generic drug: rivaroxaban TAKE 1 TABLET(20 MG) BY MOUTH DAILY WITH SUPPER What changed: See the new instructions.               Discharge Care Instructions  (From admission, onward)           Start     Ordered   01/19/21 0000  Leave dressing on - Keep it clean, dry, and intact until clinic visit        01/19/21 1143            Follow-up Information     Stechschulte, Nickola Major, MD. Go on 01/30/2021.   Specialty: Surgery Why: Your appointment is 11/11 at 2:20pm Please arrive 30 minutes prior to your appointment to check in and fill out paperwork. Bring photo ID and insurance information. Contact information:  [address] [place] [address] [phone]                Allergies  Allergen Reactions   Naproxen Other (See Comments)    (Naprosyn *ANALGESICS - ANTI-INFLAMMATORY*) Nausea, Abdominal pain    Consultations: General surgery   Procedures/Studies: CT ABDOMEN PELVIS W CONTRAST  Result Date: 01/15/2021 CLINICAL DATA:  Right lower quadrant pain. EXAM: CT ABDOMEN AND PELVIS WITH CONTRAST TECHNIQUE: Multidetector CT imaging of the abdomen and pelvis was performed using the standard protocol following bolus administration of intravenous contrast. CONTRAST:  141mL OMNIPAQUE IOHEXOL 300 MG/ML  SOLN COMPARISON:  None. FINDINGS: Lower chest: There is atelectasis in the lung bases. Hepatobiliary: There is a hypodensity in the right lobe of the liver which is too small to characterize measuring 12 mm, possibly a complex and indeterminate. Gallbladder and bile ducts are within normal limits. Pancreas: Unremarkable. No pancreatic ductal dilatation or surrounding inflammatory changes. Spleen: Normal in size without focal abnormality. Adrenals/Urinary Tract: Adrenal glands are unremarkable. Kidneys are normal,  without renal calculi, focal lesion, or hydronephrosis. Bladder is unremarkable. Stomach/Bowel: The tip of the appendix is prominent in size measuring 9 mm in there is mild surrounding inflammatory stranding. Proximal appendix is within normal limits. No bowel obstruction or free air. There is colonic diverticulosis without evidence for diverticulitis. Small bowel and stomach are within normal limits. Vascular/Lymphatic: Aortic atherosclerosis. No enlarged abdominal or pelvic lymph nodes. Reproductive: Prostate is unremarkable. Other: No abdominal wall hernia or abnormality. No abdominopelvic ascites. Musculoskeletal: Multilevel degenerative changes affect the spine. Vertebroplasty changes are seen at T12. Laminectomy defects are seen at L3 and L4. IMPRESSION: 1. Findings concerning for early acute tip appendicitis. No perforation or abscess. 2. Colonic diverticulosis without evidence for acute diverticulitis. 3. Small hypodense lesion in the liver is indeterminate. Recommend further evaluation with MRI. Electronically Signed   By: Ronney Asters M.D.   On: 01/15/2021 18:22   DG CHEST PORT 1 VIEW  Result Date: 01/15/2021 CLINICAL DATA:  Preoperative chest exam for appendectomy. EXAM: PORTABLE CHEST 1 VIEW COMPARISON:  Chest x-ray 10/19/2019. FINDINGS: There is linear atelectasis or scarring in the left lung base. There is no pleural effusion or pneumothorax. Cardiomediastinal silhouette is within normal limits. No acute fractures are seen. IMPRESSION: Left basilar atelectasis/airspace disease. Electronically Signed   By: Ronney Asters M.D.   On: 01/15/2021 22:54   (Echo, Carotid, EGD, Colonoscopy, ERCP)    Subjective: Patient seen and examined.  He was eager to go home.  Has minimal pain.  Eating breakfast complete meal with no worsening pain or nausea.  No bowel movement but passing gas. Still has some difficulty urinating, starting stream and burning but able to urinate without retention.   Discharge  Exam: Vitals:   01/19/21 0507 01/19/21 0808  BP: 134/84 115/70  Pulse: 81 81  Resp: 18 17  Temp: 97.6 F (36.4 C) 98.1 F (36.7 C)  SpO2: 94% 95%   Vitals:   01/18/21 1642 01/18/21 2057 01/19/21 0507 01/19/21 0808  BP: 129/77 129/79 134/84 115/70  Pulse: 84 85 81 81  Resp: 17 18 18 17   Temp: 97.6 F (36.4 C) 98 F (36.7 C) 97.6 F (36.4 C) 98.1 F (36.7 C)  TempSrc: Oral Oral Oral Oral  SpO2: 95% 94% 94% 95%  Weight:      Height:        General: Pt is alert, awake, not in acute distress Cardiovascular: RRR, S1/S2 +, no rubs, no  gallops Respiratory: CTA bilaterally, no wheezing, no rhonchi Abdominal: Soft, mildly tender in the midline.  JP drain with minimal serosanguineous drainage in the JP drain bulb. Extremities: no edema, no cyanosis    The results of significant diagnostics from this hospitalization (including imaging, microbiology, ancillary and laboratory) are listed below for reference.     Microbiology: Recent Results (from the past 240 hour(s))  Resp Panel by RT-PCR (Flu A&B, Covid) Nasopharyngeal Swab     Status: None   Collection Time: 01/15/21  6:46 PM   Specimen: Nasopharyngeal Swab; Nasopharyngeal(NP) swabs in vial transport medium  Result Value Ref Range Status   SARS Coronavirus 2 by RT PCR NEGATIVE NEGATIVE Final    Comment: (NOTE) SARS-CoV-2 target nucleic acids are NOT DETECTED.  The SARS-CoV-2 RNA is generally detectable in upper respiratory specimens during the acute phase of infection. The lowest concentration of SARS-CoV-2 viral copies this assay can detect is 138 copies/mL. A negative result does not preclude SARS-Cov-2 infection and should not be used as the sole basis for treatment or other patient management decisions. A negative result may occur with  improper specimen collection/handling, submission of specimen other than nasopharyngeal swab, presence of viral mutation(s) within the areas targeted by this assay, and inadequate  number of viral copies(<138 copies/mL). A negative result must be combined with clinical observations, patient history, and epidemiological information. The expected result is Negative.  Fact Sheet for Patients:  EntrepreneurPulse.com.au  Fact Sheet for Healthcare Providers:  IncredibleEmployment.be  This test is no t yet approved or cleared by the [place] FDA and  has been authorized for detection and/or diagnosis of SARS-CoV-2 by FDA under an Emergency Use Authorization (EUA). This EUA will remain  in effect (meaning this test can be used) for the duration of the COVID-19 declaration under Section 564(b)(1) of the Act, 21 U.S.C.section 360bbb-3(b)(1), unless the authorization is terminated  or revoked sooner.       Influenza A by PCR NEGATIVE NEGATIVE Final   Influenza B by PCR NEGATIVE NEGATIVE Final    Comment: (NOTE) The Xpert Xpress SARS-CoV-2/FLU/RSV plus assay is intended as an aid in the diagnosis of influenza from Nasopharyngeal swab specimens and should not be used as a sole basis for treatment. Nasal washings and aspirates are unacceptable for Xpert Xpress SARS-CoV-2/FLU/RSV testing.  Fact Sheet for Patients: EntrepreneurPulse.com.au  Fact Sheet for Healthcare Providers: IncredibleEmployment.be  This test is not yet approved or cleared by the [place] FDA and has been authorized for detection and/or diagnosis of SARS-CoV-2 by FDA under an Emergency Use Authorization (EUA). This EUA will remain in effect (meaning this test can be used) for the duration of the COVID-19 declaration under Section 564(b)(1) of the Act, 21 U.S.C. section 360bbb-3(b)(1), unless the authorization is terminated or revoked.  Performed at [facility], [address] [place], [place] [address]   Culture, blood (x 2)     Status: None (Preliminary result)   Collection Time: 01/16/21  1:30 AM    Specimen: BLOOD LEFT FOREARM  Result Value Ref Range Status   Specimen Description BLOOD LEFT FOREARM  Final   Special Requests   Final    BOTTLES DRAWN AEROBIC AND ANAEROBIC Blood Culture adequate volume   Culture   Final    NO GROWTH 3 DAYS Performed at [facility], [place] [address], [place], [place] [address]    Report Status PENDING  Incomplete  Culture, blood (x 2)     Status: None (  Preliminary result)   Collection Time: 01/16/21  1:40 AM   Specimen: BLOOD LEFT HAND  Result Value Ref Range Status   Specimen Description BLOOD LEFT HAND  Final   Special Requests   Final    BOTTLES DRAWN AEROBIC AND ANAEROBIC Blood Culture adequate volume   Culture   Final    NO GROWTH 3 DAYS Performed at [facility], [address] [place], [place] [address]    Report Status PENDING  Incomplete     Labs: BNP (last 3 results) No results for input(s): BNP in the last 8760 hours. Basic Metabolic Panel: Recent Labs  Lab 01/15/21 1255 01/16/21 0418 01/18/21 0418 01/19/21 0114  NA 133* 131* 133* 134*  K 4.1 3.9 4.0 3.9  CL 97* 98 104 102  CO2 26 25 22 27   GLUCOSE 87 124* 125* 141*  BUN 12 14 9 8   CREATININE 0.94 0.82 0.72 0.76  CALCIUM 9.4 8.5* 8.4* 8.6*  MG  --  2.0  --   --   PHOS  --  4.2  --   --    Liver Function Tests: Recent Labs  Lab 01/15/21 1255 01/16/21 0418  AST 18 23  ALT 21 16  ALKPHOS 68 63  BILITOT 0.6 0.8  PROT 7.5 6.6  ALBUMIN 4.0 3.3*   Recent Labs  Lab 01/15/21 1255  LIPASE 50   No results for input(s): AMMONIA in the last 168 hours. CBC: Recent Labs  Lab 01/15/21 1255 01/16/21 0418 01/18/21 0418 01/19/21 0114  WBC 16.2* 17.7* 7.8 5.2  NEUTROABS  --  15.2*  --   --   HGB 15.7 14.0 12.7* 13.1  HCT 46.4 42.2 39.1 40.4  MCV 97.1 99.3 99.7 100.0  PLT 212 193 170 187   Cardiac Enzymes: No results for input(s): CKTOTAL, CKMB, CKMBINDEX, TROPONINI in the last 168 hours. BNP: Invalid input(s): POCBNP CBG: No results for input(s):  GLUCAP in the last 168 hours. D-Dimer No results for input(s): DDIMER in the last 72 hours. Hgb A1c No results for input(s): HGBA1C in the last 72 hours. Lipid Profile No results for input(s): CHOL, HDL, LDLCALC, TRIG, CHOLHDL, LDLDIRECT in the last 72 hours. Thyroid function studies No results for input(s): TSH, T4TOTAL, T3FREE, THYROIDAB in the last 72 hours.  Invalid input(s): FREET3 Anemia work up No results for input(s): VITAMINB12, FOLATE, FERRITIN, TIBC, IRON, RETICCTPCT in the last 72 hours. Urinalysis    Component Value Date/Time   COLORURINE AMBER (A) 01/18/2021 1559   APPEARANCEUR CLEAR 01/18/2021 1559   LABSPEC 1.018 01/18/2021 1559   PHURINE 5.0 01/18/2021 1559   GLUCOSEU NEGATIVE 01/18/2021 1559   HGBUR NEGATIVE 01/18/2021 [place] 01/18/2021 1559   BILIRUBINUR Negative 09/06/2018 [place] 01/18/2021 1559   PROTEINUR NEGATIVE 01/18/2021 1559   UROBILINOGEN negative (A) 09/06/2018 1023   NITRITE POSITIVE (A) 01/18/2021 1559   LEUKOCYTESUR NEGATIVE 01/18/2021 1559   Sepsis Labs Invalid input(s): PROCALCITONIN,  WBC,  LACTICIDVEN Microbiology Recent Results (from the past 240 hour(s))  Resp Panel by RT-PCR (Flu A&B, Covid) Nasopharyngeal Swab     Status: None   Collection Time: 01/15/21  6:46 PM   Specimen: Nasopharyngeal Swab; Nasopharyngeal(NP) swabs in vial transport medium  Result Value Ref Range Status   SARS Coronavirus 2 by RT PCR NEGATIVE NEGATIVE Final    Comment: (NOTE) SARS-CoV-2 target nucleic acids are NOT DETECTED.  The SARS-CoV-2 RNA is generally detectable in upper respiratory specimens during the  acute phase of infection. The lowest concentration of SARS-CoV-2 viral copies this assay can detect is 138 copies/mL. A negative result does not preclude SARS-Cov-2 infection and should not be used as the sole basis for treatment or other patient management decisions. A negative result may occur with  improper  specimen collection/handling, submission of specimen other than nasopharyngeal swab, presence of viral mutation(s) within the areas targeted by this assay, and inadequate number of viral copies(<138 copies/mL). A negative result must be combined with clinical observations, patient history, and epidemiological information. The expected result is Negative.  Fact Sheet for Patients:  EntrepreneurPulse.com.au  Fact Sheet for Healthcare Providers:  IncredibleEmployment.be  This test is no t yet approved or cleared by the [place] FDA and  has been authorized for detection and/or diagnosis of SARS-CoV-2 by FDA under an Emergency Use Authorization (EUA). This EUA will remain  in effect (meaning this test can be used) for the duration of the COVID-19 declaration under Section 564(b)(1) of the Act, 21 U.S.C.section 360bbb-3(b)(1), unless the authorization is terminated  or revoked sooner.       Influenza A by PCR NEGATIVE NEGATIVE Final   Influenza B by PCR NEGATIVE NEGATIVE Final    Comment: (NOTE) The Xpert Xpress SARS-CoV-2/FLU/RSV plus assay is intended as an aid in the diagnosis of influenza from Nasopharyngeal swab specimens and should not be used as a sole basis for treatment. Nasal washings and aspirates are unacceptable for Xpert Xpress SARS-CoV-2/FLU/RSV testing.  Fact Sheet for Patients: EntrepreneurPulse.com.au  Fact Sheet for Healthcare Providers: IncredibleEmployment.be  This test is not yet approved or cleared by the [place] FDA and has been authorized for detection and/or diagnosis of SARS-CoV-2 by FDA under an Emergency Use Authorization (EUA). This EUA will remain in effect (meaning this test can be used) for the duration of the COVID-19 declaration under Section 564(b)(1) of the Act, 21 U.S.C. section 360bbb-3(b)(1), unless the authorization is terminated or revoked.  Performed at  [facility], [address] [place], [place] [address]   Culture, blood (x 2)     Status: None (Preliminary result)   Collection Time: 01/16/21  1:30 AM   Specimen: BLOOD LEFT FOREARM  Result Value Ref Range Status   Specimen Description BLOOD LEFT FOREARM  Final   Special Requests   Final    BOTTLES DRAWN AEROBIC AND ANAEROBIC Blood Culture adequate volume   Culture   Final    NO GROWTH 3 DAYS Performed at [facility], [place] [address] [place], [place] [address]    Report Status PENDING  Incomplete  Culture, blood (x 2)     Status: None (Preliminary result)   Collection Time: 01/16/21  1:40 AM   Specimen: BLOOD LEFT HAND  Result Value Ref Range Status   Specimen Description BLOOD LEFT HAND  Final   Special Requests   Final    BOTTLES DRAWN AEROBIC AND ANAEROBIC Blood Culture adequate volume   Culture   Final    NO GROWTH 3 DAYS Performed at [facility], [place] [address], [place],  [address]    Report Status PENDING  Incomplete     Time coordinating discharge: 35 minutes  SIGNED:   Barb Merino, MD  [facility] 01/19/2021, 11:44 AM

## 2021-01-19 NOTE — Telephone Encounter (Signed)
I called the patient and he has a follow up with a provider on 01/28/21. Nothing further needed.

## 2021-01-19 NOTE — Progress Notes (Signed)
Mobility Specialist Progress Note:   01/19/21 1030  Mobility  Activity Ambulated in hall  Level of Assistance Contact guard assist, steadying assist  Assistive Device Front wheel walker  Distance Ambulated (ft) 150 ft  Mobility Ambulated with assistance in hallway  Mobility Response Tolerated well  Mobility performed by Mobility specialist  $Mobility charge 1 Mobility   Pt asx during ambulation. Had multiple minor LOBs which is baseline per pt. Pt left in bed with bed alarm on and RN present.   Nelta Numbers Mobility Specialist  Phone [phone]

## 2021-01-19 NOTE — Telephone Encounter (Signed)
He had appy 01/17/21. He has fu with me 02/19/21  Plan  - hold off esbriet until after discharge - keep appt with me 02/19/21  - but give appt with APP next week to help restart esbriet   Reply to me not needed

## 2021-01-20 LAB — SURGICAL PATHOLOGY

## 2021-01-21 LAB — CULTURE, BLOOD (ROUTINE X 2)
Culture: NO GROWTH
Culture: NO GROWTH
Special Requests: ADEQUATE
Special Requests: ADEQUATE

## 2021-01-22 ENCOUNTER — Telehealth: Payer: Self-pay

## 2021-01-22 NOTE — Telephone Encounter (Signed)
Transition Care Management Follow-up Telephone Call Date of discharge and from where: [facility] 01/15/21-01/19/21 How have you been since you were released from the hospital? Patient notes that he is feeling better than he though he would.  He is only taking tylenol for pain. Any questions or concerns? No  Items Reviewed: Did the pt receive and understand the discharge instructions provided? Yes  Medications obtained and verified? Yes  Other? No  Any new allergies since your discharge? Yes  Dietary orders reviewed? Yes Do you have support at home? Yes - wife  Home Care and Equipment/Supplies: Were home health services ordered? no If so, what is the name of the agency? N/a  Has the agency set up a time to come to the patient's home? not applicable Were any new equipment or medical supplies ordered?  No What is the name of the medical supply agency? N/a Were you able to get the supplies/equipment? not applicable Do you have any questions related to the use of the equipment or supplies? No  Functional Questionnaire: (I = Independent and D = Dependent) ADLs: I  Bathing/Dressing- I  Meal Prep- I  Eating- I  Maintaining continence- I  Transferring/Ambulation- I  Managing Meds- I  Follow up appointments reviewed:  PCP Hospital f/u appt confirmed? Yes  Scheduled to see Dr. Volanda Napoleon on 01/27/21 @ 0900. [facility] f/u appt confirmed? No   Are transportation arrangements needed? No  If their condition worsens, is the pt aware to call PCP or go to the Emergency Dept.? Yes Was the patient provided with contact information for the PCP's office or ED? Yes Was to pt encouraged to call back with questions or concerns? Yes  Johnney Killian, RN, BSN, CCM Care Management Coordinator Phone: [phone]: [phone]

## 2021-01-23 ENCOUNTER — Other Ambulatory Visit: Payer: Self-pay | Admitting: Gastroenterology

## 2021-01-23 ENCOUNTER — Other Ambulatory Visit: Payer: Self-pay | Admitting: Cardiology

## 2021-01-23 DIAGNOSIS — J383 Other diseases of vocal cords: Secondary | ICD-10-CM | POA: Diagnosis not present

## 2021-01-23 DIAGNOSIS — K219 Gastro-esophageal reflux disease without esophagitis: Secondary | ICD-10-CM | POA: Diagnosis not present

## 2021-01-23 DIAGNOSIS — J3489 Other specified disorders of nose and nasal sinuses: Secondary | ICD-10-CM | POA: Diagnosis not present

## 2021-01-23 DIAGNOSIS — J387 Other diseases of larynx: Secondary | ICD-10-CM | POA: Diagnosis not present

## 2021-01-26 ENCOUNTER — Telehealth: Payer: Self-pay | Admitting: Internal Medicine

## 2021-01-27 ENCOUNTER — Encounter: Payer: Self-pay | Admitting: Primary Care

## 2021-01-27 ENCOUNTER — Ambulatory Visit: Payer: Medicare Other | Admitting: Primary Care

## 2021-01-27 ENCOUNTER — Other Ambulatory Visit: Payer: Self-pay

## 2021-01-27 ENCOUNTER — Telehealth: Payer: Self-pay | Admitting: Primary Care

## 2021-01-27 DIAGNOSIS — K353 Acute appendicitis with localized peritonitis, without perforation or gangrene: Secondary | ICD-10-CM

## 2021-01-27 DIAGNOSIS — G4733 Obstructive sleep apnea (adult) (pediatric): Secondary | ICD-10-CM | POA: Diagnosis not present

## 2021-01-27 DIAGNOSIS — J84112 Idiopathic pulmonary fibrosis: Secondary | ICD-10-CM | POA: Diagnosis not present

## 2021-01-27 NOTE — Progress Notes (Signed)
@Patient  ID: Robert Lang, male    DOB: 1950/10/14, 70 y.o.   MRN: [id number]  Chief Complaint  Patient presents with   [facility] follow up    Referring provider: Elise Lang  HPI:  70 year old male, former smoker. PMH significant for COPD, IPF, obstructive sleep apnea, CAD, HTN, afib, GERD, dyslipidemia.   Patient follows with Dr. Annamaria Boots for sleep apnea; Dr. Chase Caller for IPF.    Previous [facility] pulmonary encounter: 11/18/2020 -  Dr. 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 The diagnosis based on the fact that you age over 77, previous smoking, you occupational exposures, negative serology, Caucasian ethnicity, male gender and probable UIP pattern on CT scan   Started esbriet - one week after easter 2022   Weight loss after starting Esbriet.  2 antihypertensives had to be stopped   OSA CPAP pending end of 2022   Immobility due to spinal issues   Robert Lang 70 y.o. -returns for follow-up.  At last visit we had reduce his pirfenidone to 801 milligrams twice daily.  This is because of weight loss and GI side effects.  After reducing pirfenidone he feels well.  Respiratory symptoms are stable.  Pulmonary function test done shows improvement in FVC but decline in DLCO.  Overall stable.  He is here to start his CPAP.  He could not afford a motorized wheelchair because of $1200 payment.  Instead he is gotten regular wheelchair.  We discussed clinical trials as a care option and is preliminarily interested.   Of note he has a new symptom: He has hoarseness of voice.  This is ever since he started pirfenidone.  Wife and he said that he had a recent respiratory infection for which he was COVID-negative and got antibiotics and prednisone but this did not affect the hoarseness of voice.  He denies any cough.  Denies any clearing of the throat.  He feels some of the pirfenidone is related to this.  I personally not seen this with pirfenidone.  He  thinks air hunger might be related to causing hoarseness.  He has never had ENT evaluation.  Denies any acid reflux.     11/20/20- Dr. Annamaria Boots  7 yoM former smoker(30 pk yrs) followed for OSA, complicated by CAD/ MI, AFIB, HTN, Allergic Rhinitis, COPD, ILD, Nocturnal Hypoxemia,Lung Nodules (Dr Chase Caller), Degenertive Disc Disease, ETOH, Hyperlipidemia, HST 06/10/20- AHI 31/ hr, saturation 80%-89%, body weight 194 lbs CPAP auto 5-20/ [employer] ordered 6/22   Coming now for required ov on CPAP AirSense 11 AutoSet Download- compliance 100%, AHI 1.3/ hr Body weight today-186 lbs Covid vax- 4 [employer] Began ginger root for nausea from Esbriet Reports good start on CPAP. Comfortable and learning to sleep with it.  Being evaluated for vocal weakness.   12/19/2020 Patient contacted today for follow-up IPF. Pulmonary fibrosis appears stable overall on breathing test and symptom scale. Patient developed symptoms of weight loss and GI symptoms in June 2022, Esbriet was decreased to 801mg  twice daily. He was feeling well at his follow-up with Dr. Chase Caller so Pirfenidone was increased to 801mg  three times daily in August.   He is doing well. Tolerating Esbriet 801mg  three times daily. He is experiencing less nausea. Current weight reported 182lbs. LFTs were normal in August. He needs to use electric wheelchair when shopping d/t dyspnea symptoms. Wife is unable to push him in wheelchair. He reports losing his voice when speaking, awaiting ENT appointment/referral.    SYMPTOM SCALE -  ILD 06/17/2020   07/28/2020   09/09/2020 184# 11/18/2020 185#  Esbriet 801mg  BID 12/19/2020   Esbriet 801mg  TID   O2 use ra RA     RA   Shortness of Breath 0 -> 5 scale with 5 being worst (score 6 If unable to do) 0-5       At rest 3 0.5 2 4 3   Simple tasks - showers, clothes change, eating, shaving 5 3 3 3 2   Household (dishes, doing bed, laundry) 5 3 (he does not do these tasks) 5 0 3 (he does not do these tasks)  Shopping 5 3  (he does not do this often) 3 1 3  (he does not do these tasks)  Walking level at own pace 4 3 3 1 3   Walking up Stairs 5 5 4 1 3   Total (30-36) Dyspnea Score 27 17.5 20 10 17   How bad is your cough? 3 2 1  fair 1  How bad is your fatigue 4 2 4  fair 3  How bad is nausea 0 0 3 good 2  How bad is vomiting?   0 0 0 good 0  How bad is diarrhea? 3 0 0 good 0  How bad is anxiety? 5 5 3  fair 2  How bad is depression 5 5 3  faor 3      01/27/2021- Interim hx  Patient presents today for hospital follow-up. He was admitted from 01/15/21-01/19/21 for abdominal pain and ultimately underwent laparoscopic appendectomy on 10/29. He is doing well today. He has some soreness at incisional site but otherwise no significant pain. He has not needed to take pain medication since Wednesday 01/21/21. He is eating and drinking normal. Normal BMs. He will be seeing surgery at the end of the week for follow-up.   He is tolerating Esbriet. Nausea from the medication is not significantly impacting him anymore, states that it is a "heck of a lot better than it was". LFTs were normal on 01/16/21. There was incidental findings on CT abdomen showed hypodensity to liver, indeterminate.   He saw ENT on 01/23/21 regarding voice hoarseness, complete head neck examination including flexible nasopharyngoscope demonstrated moderate supraglottic edema consistent with LPR. He is not currently interested in speech therapy.   Insurance would not cover motorized wheelchair   [employer] 12/27/09/6/22 Usage 26/30 days used > 4 hours Average usage 6 hours 49 mins Pressure 5-20cm h20 (12.4cm h20- 95%) Airleaks 21L/min (95%) AHI 1.5    Allergies  Allergen Reactions   Naproxen Other (See Comments)    (Naprosyn *ANALGESICS - ANTI-INFLAMMATORY*) Nausea, Abdominal pain    Immunization History  Administered Date(s) Administered   Influenza Split 01/09/2010, 02/09/2011   Influenza, High Dose Seasonal PF 11/15/2018, 01/11/2020,  12/27/2020   Influenza,inj,Quad PF,6+ Mos 01/01/2015   Influenza-Unspecified 01/30/2014, 02/02/2016   PFIZER(Purple Top)SARS-COV-2 Vaccination 04/27/2019, 05/18/2019, 01/14/2020, 07/22/2020   Pneumococcal Conjugate-13 11/23/2018   Pneumococcal Polysaccharide-23 08/12/2020   Tdap 02/18/2011    Past Medical History:  Diagnosis Date   Acute ST elevation myocardial infarction (STEMI) of inferolateral wall ([place]) 01/10/2019   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 M75.00)   Allergic rhinitis due to pollen 09/03/2013   J30.1)  Formatting of this note might be different from the original. J30.1)   Allergy    Anticoagulated 07/01/2016   Anxiety    Arthritis    Atrial fibrillation ([place])    Atrial flutter ([place]) 06/26/2015  CAD (coronary artery disease)    Chest pain 06/26/2015   COPD (chronic obstructive pulmonary disease) (HCC)    Coronary artery disease 07/06/2018   Cardiac catheterization 2017 showing 90% small diagonal branch disease   DDD (degenerative disc disease), cervical 09/03/2013   M50.90)  Formatting of this note might be different from the original. M50.90)   Depression    Depression    Depressive disorder 09/03/2013   Dizziness 10/28/2017   Dyslipidemia, goal LDL below 70 07/06/2018   Dyspnea on exertion 10/20/2018   Esophageal reflux 09/03/2013   Essential hypertension 07/06/2018   ETOH abuse 01/11/2019   6 pack of beer per day   Falls 10/28/2017   GERD (gastroesophageal reflux disease)    H/O amiodarone therapy 07/01/2016   Heart attack ([place])    Heart palpitations 01/27/2017   History of colon polyps    Hyperlipidemia    Hypertension    IPF (idiopathic pulmonary fibrosis) ([place])    Kidney stones    Neck pain 09/03/2013   Neuropathy 07/06/2018   Obstructive sleep apnea 08/05/2015   PLMD (periodic limb movement disorder) 11/04/2015   Polycythemia, 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 STORY: Due to alcohol/ tobacco   Pure hypercholesterolemia 09/03/2013   E78.0)  Formatting of this note might be different from the original. E78.0)   Screening for prostate cancer 09/03/2013   Smoker 01/11/2019   Smoking 07/06/2018   Status post ablation of atrial flutter 07/06/2018   2017    Tobacco History: Social History   Tobacco Use  Smoking Status Former   Packs/day: 1.00   Years: 30.00   Pack years: 30.00   Types: Cigarettes   Quit date: 01/10/2019   Years since quitting: 2.0  Smokeless Tobacco Former   Counseling given: Not Answered   Outpatient Medications Prior to Visit  Medication Sig Dispense Refill   acetaminophen (TYLENOL) 325 MG tablet Take 2 tablets (650 mg total) by mouth every 6 (six) hours as needed.     atorvastatin (LIPITOR) 80 MG tablet TAKE 1 TABLET(80 MG) BY MOUTH DAILY AT 6 PM (Patient taking differently: Take 80 mg by mouth at bedtime.) 90 tablet 2   buPROPion (WELLBUTRIN XL) 300 MG 24 hr tablet Take 1 tablet (300 mg total) by mouth daily. 90 tablet 0   busPIRone (BUSPAR) 7.5 MG tablet Take 1 tablet (7.5 mg total) by mouth 2 (two) times daily. 60 tablet 0   Cholecalciferol 25 MCG (1000 UT) tablet Take 1,000 Units by mouth daily.      docusate sodium (COLACE) 100 MG capsule Take 100 mg by mouth at bedtime.     enalapril (VASOTEC) 2.5 MG tablet Take 2.5 mg by mouth daily.     famotidine (PEPCID) 40 MG tablet Take 1 tablet (40 mg total) by mouth at bedtime. 90 tablet 3   fexofenadine (ALLEGRA) 180 MG tablet Take 180 mg by mouth at bedtime.      finasteride (PROSCAR) 5 MG tablet Take 1 tablet (5 mg total) by mouth daily. (Patient taking differently: Take 5 mg by mouth at bedtime.) 90 tablet 1   gabapentin (NEURONTIN) 100 MG capsule 4 tab po q hs. (Patient taking differently: Take 400 mg by mouth at bedtime.) 120 capsule 3   Ginger, Zingiber officinalis, (GINGER ROOT) 550 MG  CAPS Take 550 mg by mouth daily.     ibuprofen (ADVIL) 200 MG tablet Take 200 mg by  mouth every 6 (six) hours as needed for headache or moderate pain.     melatonin 5 MG TABS Take 10 mg by mouth at bedtime.     Multiple Vitamins-Minerals (PRESERVISION AREDS PO) Take 1 capsule by mouth in the morning and at bedtime.     nitroGLYCERIN (NITROSTAT) 0.4 MG SL tablet Place 1 tablet (0.4 mg total) under the tongue every 5 (five) minutes as needed for chest pain. 25 tablet 3   pantoprazole (PROTONIX) 40 MG tablet Take 40 mg by mouth daily.     Pirfenidone (ESBRIET) 801 MG TABS Take 801 mg by mouth 3 (three) times daily. 90 tablet 5   Polyethylene Glycol 3350 (MIRALAX PO) Take 17 g by mouth at bedtime.     ranolazine (RANEXA) 1000 MG SR tablet Take 1 tablet (1,000 mg total) by mouth 2 (two) times daily. 180 tablet 1   sertraline (ZOLOFT) 100 MG tablet Take 100 mg by mouth at bedtime.     solifenacin (VESICARE) 5 MG tablet Take 5 mg by mouth at bedtime.     traZODone (DESYREL) 50 MG tablet TAKE 1/2 TO 1 TABLET(25 TO 50 MG) BY MOUTH AT BEDTIME AS NEEDED FOR SLEEP (Patient taking differently: Take 50 mg by mouth at bedtime.) 30 tablet 3   Turmeric 500 MG CAPS Take 1 capsule by mouth 2 (two) times daily.      vitamin B-12 (CYANOCOBALAMIN) 1000 MCG tablet Take 1,000 mcg by mouth daily.     XARELTO 20 MG TABS tablet TAKE 1 TABLET(20 MG) BY MOUTH DAILY WITH SUPPER (Patient taking differently: Take 20 mg by mouth at bedtime.) 30 tablet 5   Oxycodone HCl 10 MG TABS Take 1 tablet (10 mg total) by mouth every 6 (six) hours as needed for severe pain (not relieved by tylenol). (Patient not taking: Reported on 01/27/2021) 28 tablet 0   No facility-administered medications prior to visit.    Review of Systems  Review of Systems  Constitutional: Negative.   HENT: Negative.    Respiratory: Negative.    Cardiovascular: Negative.   Gastrointestinal: Negative.     Physical Exam  BP 112/66 (BP Location: Left Arm,  Cuff Size: Normal)   Pulse (!) 103   Temp 98 F (36.7 C)   Ht 5\' 11"  (1.803 m)   Wt 183 lb (83 kg)   SpO2 95%   BMI 25.52 kg/m  Physical Exam Constitutional:      General: He is not in acute distress.    Appearance: Normal appearance. He is not ill-appearing.  HENT:     Head: Normocephalic and atraumatic.  Cardiovascular:     Rate and Rhythm: Normal rate and regular rhythm.  Pulmonary:     Effort: Pulmonary effort is normal.     Breath sounds: Rales present. No wheezing or rhonchi.  Abdominal:     Comments: Dressing CDI, JP drain with serous fluid  Musculoskeletal:        General: Normal range of motion.  Skin:    General: Skin is warm and dry.  Neurological:     General: No focal deficit present.     Mental Status: He is alert and oriented to person, place, and time. Mental status is at baseline.  Psychiatric:        Mood and Affect: Mood normal.        Behavior: Behavior normal.        Thought Content: Thought content normal.        Judgment: Judgment normal.  Lab Results:  CBC    Component Value Date/Time   WBC 5.2 01/19/2021 0114   RBC 4.04 (L) 01/19/2021 0114   HGB 13.1 01/19/2021 0114   HCT 40.4 01/19/2021 0114   PLT 187 01/19/2021 0114   MCV 100.0 01/19/2021 0114   MCH 32.4 01/19/2021 0114   MCHC 32.4 01/19/2021 0114   RDW 13.5 01/19/2021 0114   LYMPHSABS 0.7 01/16/2021 0418   MONOABS 1.6 (H) 01/16/2021 0418   EOSABS 0.1 01/16/2021 0418   BASOSABS 0.0 01/16/2021 0418    BMET    Component Value Date/Time   NA 134 (L) 01/19/2021 0114   NA 137 03/05/2020 1343   K 3.9 01/19/2021 0114   CL 102 01/19/2021 0114   CO2 27 01/19/2021 0114   GLUCOSE 141 (H) 01/19/2021 0114   BUN 8 01/19/2021 0114   BUN 14 03/05/2020 1343   CREATININE 0.76 01/19/2021 0114   CREATININE 0.95 01/25/2020 1134   CALCIUM 8.6 (L) 01/19/2021 0114   GFRNONAA >60 01/19/2021 0114   GFRAA 81 03/05/2020 1343    BNP No results found for: BNP  ProBNP    Component Value  Date/Time   PROBNP 109 03/05/2020 1343    Imaging: CT ABDOMEN PELVIS W CONTRAST  Result Date: 01/15/2021 CLINICAL DATA:  Right lower quadrant pain. EXAM: CT ABDOMEN AND PELVIS WITH CONTRAST TECHNIQUE: Multidetector CT imaging of the abdomen and pelvis was performed using the standard protocol following bolus administration of intravenous contrast. CONTRAST:  125mL OMNIPAQUE IOHEXOL 300 MG/ML  SOLN COMPARISON:  None. FINDINGS: Lower chest: There is atelectasis in the lung bases. Hepatobiliary: There is a hypodensity in the right lobe of the liver which is too small to characterize measuring 12 mm, possibly a complex and indeterminate. Gallbladder and bile ducts are within normal limits. Pancreas: Unremarkable. No pancreatic ductal dilatation or surrounding inflammatory changes. Spleen: Normal in size without focal abnormality. Adrenals/Urinary Tract: Adrenal glands are unremarkable. Kidneys are normal, without renal calculi, focal lesion, or hydronephrosis. Bladder is unremarkable. Stomach/Bowel: The tip of the appendix is prominent in size measuring 9 mm in there is mild surrounding inflammatory stranding. Proximal appendix is within normal limits. No bowel obstruction or free air. There is colonic diverticulosis without evidence for diverticulitis. Small bowel and stomach are within normal limits. Vascular/Lymphatic: Aortic atherosclerosis. No enlarged abdominal or pelvic lymph nodes. Reproductive: Prostate is unremarkable. Other: No abdominal wall hernia or abnormality. No abdominopelvic ascites. Musculoskeletal: Multilevel degenerative changes affect the spine. Vertebroplasty changes are seen at T12. Laminectomy defects are seen at L3 and L4. IMPRESSION: 1. Findings concerning for early acute tip appendicitis. No perforation or abscess. 2. Colonic diverticulosis without evidence for acute diverticulitis. 3. Small hypodense lesion in the liver is indeterminate. Recommend further evaluation with MRI.  Electronically Signed   By: Ronney Asters M.D.   On: 01/15/2021 18:22   DG CHEST PORT 1 VIEW  Result Date: 01/15/2021 CLINICAL DATA:  Preoperative chest exam for appendectomy. EXAM: PORTABLE CHEST 1 VIEW COMPARISON:  Chest x-ray 10/19/2019. FINDINGS: There is linear atelectasis or scarring in the left lung base. There is no pleural effusion or pneumothorax. Cardiomediastinal silhouette is within normal limits. No acute fractures are seen. IMPRESSION: Left basilar atelectasis/airspace disease. Electronically Signed   By: Ronney Asters M.D.   On: 01/15/2021 22:54     Assessment & Plan:   IPF (idiopathic pulmonary fibrosis) (HCC) - Tolerating antifibrotic. Nausea was significantly improved - Continue Esbriet 801mg  TID - LFTs were normal on 10/28- incidental finding  hypodensity on CT abdomen may need further work-up  - Scheduled for repeat PFTs and follow-up with Dr. Chase Caller on December 1st, 2022  Obstructive sleep apnea - HST 06/10/20 showed severe OSA, AHI 31/hr - Patient is 87% compliant with CPAP use > 4 hours (break in therapy d.t hospitalization) - Pressure 5-20cm h20 (12.4cm h20-95%); Residual AHI 1.5 - No changes today, continue to encourage patient wear CPAP every night for 4-6 hours or longer   Appendicitis - S/p lap appendectomy 01/17/21. Pain is well controlled, no longer needing medication. Denies N/V/D. Afebrile.  - Follow up with surgery this week       Martyn Ehrich, NP 01/27/2021

## 2021-01-27 NOTE — Telephone Encounter (Signed)
Patient was admitted end of October for abdominal pain, underwent lap appendectomy. CT abdomen did showed incidental small hypodense lesion in the liver is indeterminate. Recommend further evaluation with MRI.  Do you want me to order this or refer to PCP? LFTs on 10/28 were normal. He is tolerating Esbriet.

## 2021-01-27 NOTE — Assessment & Plan Note (Signed)
-   S/p lap appendectomy 01/17/21. Pain is well controlled, no longer needing medication. Denies N/V/D. Afebrile.  - Follow up with surgery this week

## 2021-01-27 NOTE — Telephone Encounter (Signed)
Called and spoke with pt's wife Robert Lang who stated that when she called [employer], she was told that [employer] should pay the 80% and after [name] sends another letter to insurance, they will pay the remainder 20% instead of them having monthly payments taken off from where it looked like they had been renting the machine. Nothing further needed.

## 2021-01-27 NOTE — Patient Instructions (Signed)
I will reach out to Dr. Guadalupe Dawn to discuss potential need for MRI liver  Recommendations: Continue to wear CPAP every night for 4-6 hours or longer Continue Esbriet three times a day  Follow-up:  December 1st as scheduled or sooner if needed

## 2021-01-27 NOTE — Assessment & Plan Note (Signed)
-   HST 06/10/20 showed severe OSA, AHI 31/hr - Patient is 87% compliant with CPAP use > 4 hours (break in therapy d.t hospitalization) - Pressure 5-20cm h20 (12.4cm h20-95%); Residual AHI 1.5 - No changes today, continue to encourage patient wear CPAP every night for 4-6 hours or longer

## 2021-01-27 NOTE — Assessment & Plan Note (Signed)
-   Tolerating antifibrotic. Nausea was significantly improved - Continue Esbriet 801mg  TID - LFTs were normal on 10/28- incidental finding hypodensity on CT abdomen may need further work-up  - Scheduled for repeat PFTs and follow-up with Dr. Chase Caller on December 1st, 2022

## 2021-01-28 ENCOUNTER — Inpatient Hospital Stay: Payer: Medicare Other | Admitting: Primary Care

## 2021-01-28 NOTE — Telephone Encounter (Signed)
Can you please let patient know MR wants him to follow-up with PCP regarding hypodensity liver

## 2021-01-28 NOTE — Telephone Encounter (Signed)
Pcp to order MRI but he needs to followup with PCP

## 2021-01-30 NOTE — Telephone Encounter (Signed)
Called and spoke with pt letting him know the info stated by [facility] and MR about incidental finding of liver seen on CT abd. Stated to pt that he needed to call PCP as an MRI might need to be done and he verbalized understanding. Nothing further needed.

## 2021-02-06 ENCOUNTER — Ambulatory Visit (INDEPENDENT_AMBULATORY_CARE_PROVIDER_SITE_OTHER): Payer: Medicare Other | Admitting: Medical

## 2021-02-06 ENCOUNTER — Other Ambulatory Visit: Payer: Self-pay

## 2021-02-06 VITALS — BP 107/54 | HR 93 | Resp 18 | Ht 71.0 in | Wt 184.0 lb

## 2021-02-06 DIAGNOSIS — F32A Depression, unspecified: Secondary | ICD-10-CM | POA: Diagnosis not present

## 2021-02-06 DIAGNOSIS — F419 Anxiety disorder, unspecified: Secondary | ICD-10-CM

## 2021-02-06 DIAGNOSIS — K769 Liver disease, unspecified: Secondary | ICD-10-CM | POA: Diagnosis not present

## 2021-02-06 DIAGNOSIS — F1010 Alcohol abuse, uncomplicated: Secondary | ICD-10-CM

## 2021-02-06 DIAGNOSIS — J432 Centrilobular emphysema: Secondary | ICD-10-CM | POA: Diagnosis not present

## 2021-02-06 MED ORDER — DIAZEPAM 5 MG PO TABS: ORAL_TABLET | ORAL | 0 refills | Status: DC

## 2021-02-06 MED ORDER — BUSPIRONE HCL 7.5 MG PO TABS: 7.5000 mg | ORAL_TABLET | Freq: Two times a day (BID) | ORAL | 0 refills | Status: DC

## 2021-02-06 NOTE — Progress Notes (Signed)
Subjective:    Patient ID: Robert Lang, male    DOB: 04/20/1950, 70 y.o.   MRN: [id number]  HPI  Pt in for follow up from hospital. He had surgery for ruptured appendicitis.  Admit date: 01/15/2021 Discharge date: 01/19/2021   Admitted From: Home Disposition: Home   Recommendations for Outpatient Follow-up:  Follow up with PCP in 1-2 weeks Surgery will schedule follow-up.  Discharge Condition: Stable CODE STATUS: Full code Diet recommendation: Low-salt diet   Discharge summary: 70 year old gentleman with history of COPD, interstitial pulmonary fibrosis, coronary artery disease, A. fib, alcohol abuse, GERD, hyperlipidemia presented with sudden onset of abdominal pain and found to have acute uncomplicated appendicitis.  Admitted with antibiotics and surgical consultation.  Failed conservative management.  Underwent laparoscopic appendectomy on 10/29.  Acute appendicitis: Attempted conservative management with IV fluids and antibiotics.  Persistent pain. Underwent laparoscopic appendectomy 10/29.  Clinically improved.  Surgically cleared as per surgery. Tolerating regular diet with return of bowel function. Discharging home with adequate pain medications.  Mobility.  Patient is going home with drain bulb with instructions to empty.  Surgery will schedule outpatient follow-up in the office to reassess before removal.   Essential hypertension History of coronary artery disease Paroxysmal A. fib on Xarelto Idiopathic pulmonary fibrosis on Esbriet Sleep apnea on CPAP   Chronic medical issues remained stable.  He will resume all his medications.  He will start using CPAP at night.   Dysuria: Patient had urinary retention perioperative, complaint of burning and dysuria after removal of Foley catheter.  UA was positive for some nitrites but negative for RBC or WBC.  No evidence of UTI.  Probably irritation due to Foley catheter.  She will continue to drink plenty of water.    Discharge Diagnoses:  Active Problems:   Coronary artery disease   Essential hypertension   Dyslipidemia, goal LDL below 70   Smoking   COPD (chronic obstructive pulmonary disease) (HCC)   ETOH abuse   CAD (coronary artery disease)   Atrial fibrillation (HCC)   IPF (idiopathic pulmonary fibrosis) (HCC)   Appendicitis   Alcohol abuse   OSA (obstructive sleep apnea)   Sepsis (HCC)  No abd pain, no nausea, no vomiting and no  fever.  Liver hypodense area on ct abd. Radiologist recommend getting mri.  Pt sees pulmonlogist and he states recently breathing some better.  Anxiety and depression. Mood is better since on sertraline, wellbutrin and buspar. Wife states over less week appears more relaxed. Pt has not drank any alcohol since end of October and advised to continue not to drink alcohol.     Review of Systems  Constitutional:  Negative for chills, fatigue and fever.  HENT:  Negative for congestion and drooling.   Respiratory:  Negative for cough, chest tightness, shortness of breath and wheezing.   Cardiovascular:  Negative for chest pain and palpitations.  Gastrointestinal:  Negative for abdominal pain, blood in stool, diarrhea and nausea.  Musculoskeletal:  Negative for back pain and myalgias.  Neurological:  Negative for dizziness, seizures and headaches.  Hematological:  Negative for adenopathy.  Psychiatric/Behavioral:  Negative for behavioral problems and confusion.     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 M75.00)   Allergic rhinitis due to pollen 09/03/2013   J30.1)  Formatting of this  note might be different from the original. J30.1)   Allergy    Anticoagulated 07/01/2016   Anxiety    Arthritis    Atrial fibrillation (HCC)    Atrial flutter ([place])  06/26/2015   CAD (coronary artery disease)    Chest pain 06/26/2015   COPD (chronic obstructive pulmonary disease) ([place])    Coronary artery disease 07/06/2018   Cardiac catheterization 2017 showing 90% small diagonal branch disease   DDD (degenerative disc disease), cervical 09/03/2013   M50.90)  Formatting of this note might be different from the original. M50.90)   Depression    Depression    Depressive disorder 09/03/2013   Dizziness 10/28/2017   Dyslipidemia, goal LDL below 70 07/06/2018   Dyspnea on exertion 10/20/2018   Esophageal reflux 09/03/2013   Essential hypertension 07/06/2018   ETOH abuse 01/11/2019   6 pack of beer per day   Falls 10/28/2017   GERD (gastroesophageal reflux disease)    H/O amiodarone therapy 07/01/2016   Heart attack ([place])    Heart palpitations 01/27/2017   History of colon polyps    Hyperlipidemia    Hypertension    IPF (idiopathic pulmonary fibrosis) ([place])    Kidney stones    Neck pain 09/03/2013   Neuropathy 07/06/2018   Obstructive sleep apnea 08/05/2015   PLMD (periodic limb movement disorder) 11/04/2015   Polycythemia, 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 STORY: Due to alcohol/ tobacco   Pure hypercholesterolemia 09/03/2013   E78.0)  Formatting of this note might be different from the original. E78.0)   Screening for prostate cancer 09/03/2013   Smoker 01/11/2019   Smoking 07/06/2018   Status post ablation of atrial flutter 07/06/2018   2017     Social History   Socioeconomic History   Marital status: Married    Spouse name: Not on file   Number of children: Not on file   Years of education: Not on file   Highest education level: Not on file  Occupational History   Not on file  Tobacco Use   Smoking status: Former    Packs/day: 1.00    Years: 30.00    Pack years: 30.00    Types: Cigarettes    Quit date: 01/10/2019    Years since quitting: 2.0    Smokeless tobacco: Former  Scientific laboratory technician Use: Never used  Substance and Sexual Activity   Alcohol use: Yes    Comment: 8oz of wine a day   Drug use: Never   Sexual activity: Not on file  Other Topics Concern   Not on file  Social History Narrative   Not on file   Social Determinants of Health   Financial Resource Strain: Low Risk    Difficulty of Paying Living Expenses: Not hard at all  Food Insecurity: No Food Insecurity   Worried About Charity fundraiser in the Last Year: Never true   Arboriculturist in the Last Year: Never true  Transportation Needs: No Transportation Needs   Lack of Transportation (Medical): No   Lack of Transportation (Non-Medical): No  Physical Activity: Inactive   Days of Exercise per Week: 0 days   Minutes of Exercise per Session: 0 min  Stress: No Stress Concern Present   Feeling of Stress : Not at all  Social Connections: Moderately Integrated   Frequency of Communication with Friends and Family: More than three times a  week   Frequency of Social Gatherings with Friends and Family: More than three times a week   Attends Religious Services: More than 4 times per year   Active Member of Clubs or Organizations: No   Attends Archivist Meetings: Never   Marital Status: Married  Human resources officer Violence: Not At Risk   Fear of Current or Ex-Partner: No   Emotionally Abused: No   Physically Abused: No   Sexually Abused: No    Past Surgical History:  Procedure Laterality Date   ATRIAL FIBRILLATION ABLATION  10/2015   BACK SURGERY     CARDIOVERSION  2017   CATARACT EXTRACTION Bilateral 2017   March and April 2017   COLONOSCOPY  2018   CORONARY STENT PLACEMENT  2012   CORONARY/GRAFT ACUTE MI REVASCULARIZATION N/A 01/10/2019   Procedure: CORONARY/GRAFT ACUTE MI REVASCULARIZATION;  Surgeon: Leonie Man, MD;  Location: [facility];  Service: Cardiovascular;  Laterality: N/A;   INGUINAL HERNIA REPAIR     over 20 years  ago   LAPAROSCOPIC APPENDECTOMY N/A 01/17/2021   Procedure: APPENDECTOMY LAPAROSCOPIC;  Surgeon: Felicie Morn, MD;  Location: [facility];  Service: General;  Laterality: N/A;   LEFT HEART CATH AND CORONARY ANGIOGRAPHY N/A 01/10/2019   Procedure: LEFT HEART CATH AND CORONARY ANGIOGRAPHY;  Surgeon: Leonie Man, MD;  Location: [facility];  Service: Cardiovascular;  Laterality: N/A;   LUMBAR LAMINECTOMY Bilateral 04/05/2016   L2-L5    VENTRAL HERNIA REPAIR  2018    Family History  Problem Relation Age of Onset   Colon cancer Father    Throat cancer Brother     Allergies  Allergen Reactions   Naproxen Other (See Comments)    (Naprosyn *ANALGESICS - ANTI-INFLAMMATORY*) Nausea, Abdominal pain    Current Outpatient Medications on File Prior to Visit  Medication Sig Dispense Refill   acetaminophen (TYLENOL) 325 MG tablet Take 2 tablets (650 mg total) by mouth every 6 (six) hours as needed.     atorvastatin (LIPITOR) 80 MG tablet TAKE 1 TABLET(80 MG) BY MOUTH DAILY AT 6 PM (Patient taking differently: Take 80 mg by mouth at bedtime.) 90 tablet 2   buPROPion (WELLBUTRIN XL) 300 MG 24 hr tablet Take 1 tablet (300 mg total) by mouth daily. 90 tablet 0   busPIRone (BUSPAR) 7.5 MG tablet Take 1 tablet (7.5 mg total) by mouth 2 (two) times daily. 60 tablet 0   Cholecalciferol 25 MCG (1000 UT) tablet Take 1,000 Units by mouth daily.      docusate sodium (COLACE) 100 MG capsule Take 100 mg by mouth at bedtime.     enalapril (VASOTEC) 2.5 MG tablet Take 2.5 mg by mouth daily.     famotidine (PEPCID) 40 MG tablet Take 1 tablet (40 mg total) by mouth at bedtime. 90 tablet 3   fexofenadine (ALLEGRA) 180 MG tablet Take 180 mg by mouth at bedtime.      finasteride (PROSCAR) 5 MG tablet Take 1 tablet (5 mg total) by mouth daily. (Patient taking differently: Take 5 mg by mouth at bedtime.) 90 tablet 1   gabapentin (NEURONTIN) 100 MG capsule 4 tab po q hs. (Patient taking differently: Take 400  mg by mouth at bedtime.) 120 capsule 3   Ginger, Zingiber officinalis, (GINGER ROOT) 550 MG CAPS Take 550 mg by mouth daily.     ibuprofen (ADVIL) 200 MG tablet Take 200 mg by mouth every 6 (six) hours as needed for headache or moderate  pain.     melatonin 5 MG TABS Take 10 mg by mouth at bedtime.     Multiple Vitamins-Minerals (PRESERVISION AREDS PO) Take 1 capsule by mouth in the morning and at bedtime.     nitroGLYCERIN (NITROSTAT) 0.4 MG SL tablet Place 1 tablet (0.4 mg total) under the tongue every 5 (five) minutes as needed for chest pain. 25 tablet 3   Oxycodone HCl 10 MG TABS Take 1 tablet (10 mg total) by mouth every 6 (six) hours as needed for severe pain (not relieved by tylenol). 28 tablet 0   pantoprazole (PROTONIX) 40 MG tablet Take 40 mg by mouth daily.     Pirfenidone (ESBRIET) 801 MG TABS Take 801 mg by mouth 3 (three) times daily. 90 tablet 5   Polyethylene Glycol 3350 (MIRALAX PO) Take 17 g by mouth at bedtime.     ranolazine (RANEXA) 1000 MG SR tablet Take 1 tablet (1,000 mg total) by mouth 2 (two) times daily. 180 tablet 1   sertraline (ZOLOFT) 100 MG tablet Take 100 mg by mouth at bedtime.     solifenacin (VESICARE) 5 MG tablet Take 5 mg by mouth at bedtime.     traZODone (DESYREL) 50 MG tablet TAKE 1/2 TO 1 TABLET(25 TO 50 MG) BY MOUTH AT BEDTIME AS NEEDED FOR SLEEP (Patient taking differently: Take 50 mg by mouth at bedtime.) 30 tablet 3   Turmeric 500 MG CAPS Take 1 capsule by mouth 2 (two) times daily.      vitamin B-12 (CYANOCOBALAMIN) 1000 MCG tablet Take 1,000 mcg by mouth daily.     XARELTO 20 MG TABS tablet TAKE 1 TABLET(20 MG) BY MOUTH DAILY WITH SUPPER (Patient taking differently: Take 20 mg by mouth at bedtime.) 30 tablet 5   No current facility-administered medications on file prior to visit.    BP (!) 107/54   Pulse 93   Resp 18   Ht 5\' 11"  (1.803 m)   Wt 184 lb (83.5 kg)   SpO2 95%   BMI 25.66 kg/m       Objective:   Physical Exam  General- No  acute distress. Pleasant patient. Neck- Full range of motion, no jvd Lungs- Clear, even and unlabored. Heart- regular rate and rhythm. Neurologic- CNII- XII grossly intact.  Back- no cva tenderness.      Assessment & Plan:   Patient Instructions  Liver lesion on CT abdomen report.  Radiologist recommends getting MRI to further assess area.  I placed that order today and will notify referral staff to check to see if prior authorization needed.  I did send in 2 tablet prescription of low-dose Valium to use prior to the procedure.   Glad to hear that you feel much better/recovered post appendectomy.  History of COPD and pulmonary fibrosis.  Continue treatment prescribed by pulmonologist.  For depression and anxiety continue with Wellbutrin, sertraline and BuSpar.  Sent in 1 month prescription of BuSpar.  If your anxiety is increasing in the future could increase the dose of the BuSpar.  Follow-up with psychiatric PA.  History of alcohol abuse and recommend not drinking any alcohol as we discussed.  Follow-up in 3 months or sooner if needed.   Time spent with patient today was 40  minutes which consisted of chart revdiew, discussing diagnosis, work up treatment and documentation.

## 2021-02-06 NOTE — Patient Instructions (Signed)
Liver lesion on CT abdomen report.  Radiologist recommends getting MRI to further assess area.  I placed that order today and will notify referral staff to check to see if prior authorization needed.  I did send in 2 tablet prescription of low-dose Valium to use prior to the procedure.   Glad to hear that you feel much better/recovered post appendectomy.  History of COPD and pulmonary fibrosis.  Continue treatment prescribed by pulmonologist.  For depression and anxiety continue with Wellbutrin, sertraline and BuSpar.  Sent in 1 month prescription of BuSpar.  If your anxiety is increasing in the future could increase the dose of the BuSpar.  Follow-up with psychiatric PA.  History of alcohol abuse and recommend not drinking any alcohol as we discussed.  Follow-up in 3 months or sooner if needed.

## 2021-02-10 ENCOUNTER — Other Ambulatory Visit: Payer: Self-pay | Admitting: Gastroenterology

## 2021-02-19 ENCOUNTER — Encounter: Payer: Self-pay | Admitting: Internal Medicine

## 2021-02-19 ENCOUNTER — Ambulatory Visit (INDEPENDENT_AMBULATORY_CARE_PROVIDER_SITE_OTHER): Payer: Medicare Other | Admitting: Internal Medicine

## 2021-02-19 ENCOUNTER — Other Ambulatory Visit: Payer: Self-pay

## 2021-02-19 ENCOUNTER — Ambulatory Visit: Payer: Medicare Other | Admitting: Internal Medicine

## 2021-02-19 VITALS — BP 128/64 | HR 83 | Temp 97.5°F | Ht 71.0 in | Wt 187.8 lb

## 2021-02-19 DIAGNOSIS — F331 Major depressive disorder, recurrent, moderate: Secondary | ICD-10-CM | POA: Diagnosis not present

## 2021-02-19 DIAGNOSIS — Z5181 Encounter for therapeutic drug level monitoring: Secondary | ICD-10-CM

## 2021-02-19 DIAGNOSIS — J84112 Idiopathic pulmonary fibrosis: Secondary | ICD-10-CM

## 2021-02-19 DIAGNOSIS — F4322 Adjustment disorder with anxiety: Secondary | ICD-10-CM | POA: Diagnosis not present

## 2021-02-19 LAB — PULMONARY FUNCTION TEST
DL/VA % pred: 81 %
DL/VA: 3.3 ml/min/mmHg/L
DLCO cor % pred: 75 %
DLCO cor: 20.29 ml/min/mmHg
DLCO unc % pred: 75 %
DLCO unc: 20.29 ml/min/mmHg
FEF 25-75 Pre: 1.68 L/sec
FEF2575-%Pred-Pre: 65 %
FEV1-%Pred-Pre: 75 %
FEV1-Pre: 2.56 L
FEV1FVC-%Pred-Pre: 99 %
FEV6-%Pred-Pre: 78 %
FEV6-Pre: 3.42 L
FEV6FVC-%Pred-Pre: 103 %
FVC-%Pred-Pre: 75 %
FVC-Pre: 3.49 L
Pre FEV1/FVC ratio: 73 %
Pre FEV6/FVC Ratio: 98 %

## 2021-02-19 NOTE — Patient Instructions (Addendum)
ICD-10-CM   1. IPF (idiopathic pulmonary fibrosis) ([place])  J84.112     2. Medication monitoring encounter  Z51.81       Overall stable IPF Tolerating pirfenidone to 801 mg three times daily Last LFT 01/16/21 - normal   Plan -Check liver function test today 02/19/2021 - Continue pirfenidone to 801 mg 3 times daily   -Take it with food, drink a lot of water and also try ginger capsules  -If side effects return then reduced to 800 mg twice daily  - wear sunscreen when outside -We again discussed clinical trials as a care option; appreciate your interest  -We will keep you posted if you qualify for any trials taking into account her immobility from spinal issues --Repeat spirometry and DLCO test in 4-5 months   Follow-up - 4 to 5 months with Dr Chase Caller - 30 min visit but after PFT  - simple walk test and  symptms score at followup

## 2021-02-19 NOTE — Progress Notes (Signed)
Spirometry and DLCO completed today  ?
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
## 2021-02-20 LAB — HEPATIC FUNCTION PANEL
ALT: 20 U/L (ref 0–53)
AST: 17 U/L (ref 0–37)
Albumin: 4.1 g/dL (ref 3.5–5.2)
Alkaline Phosphatase: 74 U/L (ref 39–117)
Bilirubin, Direct: 0 mg/dL (ref 0.0–0.3)
Total Bilirubin: 0.4 mg/dL (ref 0.2–1.2)
Total Protein: 7.6 g/dL (ref 6.0–8.3)

## 2021-02-22 ENCOUNTER — Ambulatory Visit (HOSPITAL_COMMUNITY)
Admission: RE | Admit: 2021-02-22 | Discharge: 2021-02-22 | Disposition: A | Discharge status: Home / Self Care | Payer: Medicare Other | Source: Ambulatory Visit | Attending: Medical | Admitting: Medical

## 2021-02-22 DIAGNOSIS — K769 Liver disease, unspecified: Secondary | ICD-10-CM | POA: Diagnosis not present

## 2021-02-22 DIAGNOSIS — K7689 Other specified diseases of liver: Secondary | ICD-10-CM | POA: Diagnosis not present

## 2021-02-22 DIAGNOSIS — M47816 Spondylosis without myelopathy or radiculopathy, lumbar region: Secondary | ICD-10-CM | POA: Diagnosis not present

## 2021-02-22 DIAGNOSIS — D1803 Hemangioma of intra-abdominal structures: Secondary | ICD-10-CM | POA: Diagnosis not present

## 2021-02-22 MED ORDER — GADOBUTROL 1 MMOL/ML IV SOLN
8.0000 mL | Freq: Once | INTRAVENOUS | Status: AC | PRN
  Administered 2021-02-22: 08:00:00 8 mL via INTRAVENOUS

## 2021-02-23 ENCOUNTER — Ambulatory Visit (HOSPITAL_COMMUNITY): Admission: RE | Admit: 2021-02-23 | Payer: Medicare Other | Source: Ambulatory Visit

## 2021-03-06 ENCOUNTER — Encounter: Payer: Self-pay | Admitting: Medical

## 2021-03-06 NOTE — Telephone Encounter (Signed)
Please advise on something OTC , everybody is booked

## 2021-03-07 ENCOUNTER — Other Ambulatory Visit: Payer: Self-pay | Admitting: Medical

## 2021-03-07 ENCOUNTER — Telehealth: Payer: Self-pay | Admitting: Medical

## 2021-03-07 MED ORDER — FLUTICASONE PROPIONATE 50 MCG/ACT NA SUSP: 2.0000 | Freq: Every day | NASAL | 1 refills | Status: DC

## 2021-03-07 MED ORDER — BENZONATATE 100 MG PO CAPS: 100.0000 mg | ORAL_CAPSULE | Freq: Three times a day (TID) | ORAL | 0 refills | Status: DC | PRN

## 2021-03-07 NOTE — Telephone Encounter (Signed)
Rx flonase and benzonatate sent to pt pharmacy.

## 2021-03-09 ENCOUNTER — Ambulatory Visit: Payer: Medicare Other | Admitting: Cardiology

## 2021-03-10 ENCOUNTER — Other Ambulatory Visit: Payer: Self-pay

## 2021-03-10 ENCOUNTER — Encounter: Payer: Self-pay | Admitting: Cardiology

## 2021-03-10 ENCOUNTER — Ambulatory Visit: Payer: Medicare Other | Admitting: Cardiology

## 2021-03-10 VITALS — BP 104/50 | HR 102 | Ht 71.0 in | Wt 194.0 lb

## 2021-03-10 DIAGNOSIS — I480 Paroxysmal atrial fibrillation: Secondary | ICD-10-CM

## 2021-03-10 DIAGNOSIS — R0609 Other forms of dyspnea: Secondary | ICD-10-CM

## 2021-03-10 DIAGNOSIS — E785 Hyperlipidemia, unspecified: Secondary | ICD-10-CM | POA: Diagnosis not present

## 2021-03-10 DIAGNOSIS — I2510 Atherosclerotic heart disease of native coronary artery without angina pectoris: Secondary | ICD-10-CM | POA: Diagnosis not present

## 2021-03-10 DIAGNOSIS — J84112 Idiopathic pulmonary fibrosis: Secondary | ICD-10-CM

## 2021-03-10 MED ORDER — FINASTERIDE 5 MG PO TABS: 5.0000 mg | ORAL_TABLET | Freq: Every day | ORAL | 1 refills | Status: AC

## 2021-03-10 MED ORDER — ATORVASTATIN CALCIUM 80 MG PO TABS: 80.0000 mg | ORAL_TABLET | Freq: Every day | ORAL | 3 refills | Status: DC

## 2021-03-10 MED ORDER — RANOLAZINE ER 1000 MG PO TB12: 1000.0000 mg | ORAL_TABLET | Freq: Two times a day (BID) | ORAL | 1 refills | Status: DC

## 2021-03-10 MED ORDER — NITROGLYCERIN 0.4 MG SL SUBL: 0.4000 mg | SUBLINGUAL_TABLET | SUBLINGUAL | 3 refills | Status: AC | PRN

## 2021-03-10 MED ORDER — RIVAROXABAN 20 MG PO TABS: 20.0000 mg | ORAL_TABLET | Freq: Every day | ORAL | 12 refills | Status: DC

## 2021-03-10 NOTE — Progress Notes (Signed)
Cardiology Office Note:    Date:  03/10/2021   ID:  Robert Lang, DOB 06/29/1950, MRN [id number]  PCP:  Mackie Pai, [name]  Cardiologist:  Jenne Campus, MD    Referring MD: Mackie Pai, [place]   Chief Complaint  Patient presents with   Medication Refill    Enalapril, Famotidine, Xarelto and Atorvastatin    History of Present Illness:    Robert Lang is a 70 y.o. male   with a hx of CAD s/p PCI to LAD/RCA, atrial flutter s/p ablation 2017, HLD, HTN last seen while hospitalized.   LHC at [facility] 2017 with patent stent in prox LAD, 90% occlusion in small first diagonal artery and 25% occlusion of RCA w/ good EF 60%. Atrial flutter ablation at [facility] in 2017. Stress test 10/30/18 with no perfusion defect described as low risk study.   Presented to [place] ED 01/10/19 with chest pain, code STEMI called, transferred to [facility]. Troponin peaked at [phone]. Echo preserved LV function. Underwent cardiac cath 01/10/19 with PCI and DES to RCA. Recommended for triple therapy for 1 month then stop aspirin. 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 His diltiazem was switched to coreg while hospitalized. He is coming today 2 months for follow-up.  He is now doing well.  Shortness of breath is clearly present even when he is just sitting and talking to me.  He is telling the story about boiling peanuts and he have to stop multiple times to catch his breath.  He tells me that he does not use oxygen but understand that eventually he will have to.  He denies have any chest pain tightness squeezing pressure burning chest.  There when he did have his myocardial infarction in 2020 his symptoms were heartburn which he does not have now.  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 M75.00)   Allergic rhinitis due to pollen 09/03/2013   J30.1)  Formatting of this note might be different from the original. J30.1)   Allergy    Anticoagulated 07/01/2016   Anxiety    Arthritis    Atrial fibrillation ([place])    Atrial flutter ([place]) 06/26/2015   CAD (coronary artery disease)    Chest pain 06/26/2015   COPD (chronic obstructive pulmonary disease) ([place])    Coronary artery disease 07/06/2018   Cardiac catheterization 2017 showing 90% small diagonal branch disease   DDD (degenerative disc disease), cervical 09/03/2013   M50.90)  Formatting of this note might be different from the original. M50.90)   Depression    Depression    Depressive disorder 09/03/2013   Dizziness 10/28/2017   Dyslipidemia, goal LDL below 70 07/06/2018   Dyspnea on exertion 10/20/2018   Esophageal reflux 09/03/2013   Essential hypertension 07/06/2018   ETOH abuse 01/11/2019   6 pack of beer per day   Falls 10/28/2017   GERD (gastroesophageal reflux disease)    H/O amiodarone therapy 07/01/2016   Heart attack ([place])    Heart palpitations 01/27/2017   History of colon polyps    Hyperlipidemia    Hypertension    IPF (idiopathic pulmonary fibrosis) ([place])    Kidney stones    Neck pain 09/03/2013   Neuropathy 07/06/2018   Obstructive sleep  apnea 08/05/2015   PLMD (periodic limb movement disorder) 11/04/2015   Polycythemia, 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 STORY: Due to alcohol/ tobacco   Pure hypercholesterolemia 09/03/2013   E78.0)  Formatting of this note might be different from the original. E78.0)   Screening for prostate cancer 09/03/2013   Smoker 01/11/2019   Smoking 07/06/2018   Status post ablation of atrial flutter 07/06/2018   2017    Past Surgical History:  Procedure Laterality Date   ATRIAL FIBRILLATION  ABLATION  10/2015   BACK SURGERY     CARDIOVERSION  2017   CATARACT EXTRACTION Bilateral 2017   March and April 2017   COLONOSCOPY  2018   CORONARY STENT PLACEMENT  2012   CORONARY/GRAFT ACUTE MI REVASCULARIZATION N/A 01/10/2019   Procedure: CORONARY/GRAFT ACUTE MI REVASCULARIZATION;  Surgeon: Leonie Man, MD;  Location: [facility];  Service: Cardiovascular;  Laterality: N/A;   INGUINAL HERNIA REPAIR     over 20 years ago   LAPAROSCOPIC APPENDECTOMY N/A 01/17/2021   Procedure: APPENDECTOMY LAPAROSCOPIC;  Surgeon: Felicie Morn, MD;  Location: [facility];  Service: General;  Laterality: N/A;   LEFT HEART CATH AND CORONARY ANGIOGRAPHY N/A 01/10/2019   Procedure: LEFT HEART CATH AND CORONARY ANGIOGRAPHY;  Surgeon: Leonie Man, MD;  Location: [facility];  Service: Cardiovascular;  Laterality: N/A;   LUMBAR LAMINECTOMY Bilateral 04/05/2016   L2-L5    VENTRAL HERNIA REPAIR  2018    Current Medications: Current Meds  Medication Sig   acetaminophen (TYLENOL) 325 MG tablet Take 2 tablets (650 mg total) by mouth every 6 (six) hours as needed.   atorvastatin (LIPITOR) 80 MG tablet TAKE 1 TABLET(80 MG) BY MOUTH DAILY AT 6 PM (Patient taking differently: Take 80 mg by mouth at bedtime.)   benzonatate (TESSALON) 100 MG capsule Take 1 capsule (100 mg total) by mouth 3 (three) times daily as needed for cough.   buPROPion (WELLBUTRIN XL) 300 MG 24 hr tablet Take 1 tablet (300 mg total) by mouth daily.   busPIRone (BUSPAR) 7.5 MG tablet Take 1 tablet (7.5 mg total) by mouth 2 (two) times daily.   Cholecalciferol 25 MCG (1000 UT) tablet Take 1,000 Units by mouth daily.    docusate sodium (COLACE) 100 MG capsule Take 100 mg by mouth at bedtime.   enalapril (VASOTEC) 2.5 MG tablet Take 2.5 mg by mouth daily.   famotidine (PEPCID) 40 MG tablet Take 1 tablet (40 mg total) by mouth at bedtime.   fexofenadine (ALLEGRA) 180 MG tablet Take 180 mg by mouth at bedtime.    finasteride  (PROSCAR) 5 MG tablet Take 1 tablet (5 mg total) by mouth daily. (Patient taking differently: Take 5 mg by mouth at bedtime.)   fluticasone (FLONASE) 50 MCG/ACT nasal spray Place 1 spray into both nostrils daily as needed for allergies or rhinitis.   gabapentin (NEURONTIN) 100 MG capsule 4 tab po q hs. (Patient taking differently: Take 400 mg by mouth at bedtime.)   Ginger, Zingiber officinalis, (GINGER ROOT) 550 MG CAPS Take 550 mg by mouth daily.   ibuprofen (ADVIL) 200 MG tablet Take 200 mg by mouth every 6 (six) hours as needed for headache or moderate pain.   melatonin 5 MG TABS Take 10 mg by mouth at bedtime.   Multiple Vitamins-Minerals (PRESERVISION AREDS PO) Take 1 capsule by mouth in the morning and at bedtime.  nitroGLYCERIN (NITROSTAT) 0.4 MG SL tablet Place 1 tablet (0.4 mg total) under the tongue every 5 (five) minutes as needed for chest pain.   Oxycodone HCl 10 MG TABS Take 1 tablet (10 mg total) by mouth every 6 (six) hours as needed for severe pain (not relieved by tylenol).   pantoprazole (PROTONIX) 40 MG tablet Take 40 mg by mouth daily.   Pirfenidone (ESBRIET) 801 MG TABS Take 801 mg by mouth 3 (three) times daily.   Polyethylene Glycol 3350 (MIRALAX PO) Take 17 g by mouth at bedtime.   ranolazine (RANEXA) 1000 MG SR tablet Take 1 tablet (1,000 mg total) by mouth 2 (two) times daily.   sertraline (ZOLOFT) 100 MG tablet Take 100 mg by mouth at bedtime.   solifenacin (VESICARE) 5 MG tablet Take 5 mg by mouth at bedtime.   traZODone (DESYREL) 50 MG tablet TAKE 1/2 TO 1 TABLET(25 TO 50 MG) BY MOUTH AT BEDTIME AS NEEDED FOR SLEEP (Patient taking differently: Take 50 mg by mouth at bedtime.)   Turmeric 500 MG CAPS Take 1 capsule by mouth 2 (two) times daily.    vitamin B-12 (CYANOCOBALAMIN) 1000 MCG tablet Take 1,000 mcg by mouth daily.   XARELTO 20 MG TABS tablet TAKE 1 TABLET(20 MG) BY MOUTH DAILY WITH SUPPER (Patient taking differently: Take 20 mg by mouth at bedtime.)    [DISCONTINUED] pantoprazole (PROTONIX) 40 MG tablet TAKE 1 TABLET(40 MG) BY MOUTH DAILY     Allergies:   Naproxen   Social History   Socioeconomic History   Marital status: Married    Spouse name: Not on file   Number of children: Not on file   Years of education: Not on file   Highest education level: Not on file  Occupational History   Not on file  Tobacco Use   Smoking status: Former    Packs/day: 1.00    Years: 30.00    Pack years: 30.00    Types: Cigarettes    Quit date: 01/10/2019    Years since quitting: 2.1   Smokeless tobacco: Former  Scientific laboratory technician Use: Never used  Substance and Sexual Activity   Alcohol use: Yes    Comment: 8oz of wine a day   Drug use: Never   Sexual activity: Not on file  Other Topics Concern   Not on file  Social History Narrative   Not on file   Social Determinants of Health   Financial Resource Strain: Low Risk    Difficulty of Paying Living Expenses: Not hard at all  Food Insecurity: No Food Insecurity   Worried About Charity fundraiser in the Last Year: Never true   Ran Out of Food in the Last Year: Never true  Transportation Needs: No Transportation Needs   Lack of Transportation (Medical): No   Lack of Transportation (Non-Medical): No  Physical Activity: Inactive   Days of Exercise per Week: 0 days   Minutes of Exercise per Session: 0 min  Stress: No Stress Concern Present   Feeling of Stress : Not at all  Social Connections: Moderately Integrated   Frequency of Communication with Friends and Family: More than three times a week   Frequency of Social Gatherings with Friends and Family: More than three times a week   Attends Religious Services: More than 4 times per year   Active Member of [employer] or Organizations: No   Attends Archivist Meetings: Never   Marital Status: Married  Family History: The patient's family history includes Colon cancer in his father; Throat cancer in his brother. ROS:    Please see the history of present illness.    All 14 point review of systems negative except as described per history of present illness  EKGs/Labs/Other Studies Reviewed:      Recent Labs: 01/16/2021: Magnesium 2.0; TSH 1.232 01/19/2021: BUN 8; Creatinine, Ser 0.76; Hemoglobin 13.1; Platelets 187; Potassium 3.9; Sodium 134 02/19/2021: ALT 20  Recent Lipid Panel    Component Value Date/Time   CHOL 138 10/13/2020 0923   CHOL 128 04/03/2020 0951   TRIG 380.0 (H) 10/13/2020 0923   HDL 28.60 (L) 10/13/2020 0923   HDL 40 04/03/2020 0951   CHOLHDL 5 10/13/2020 0923   VLDL 76.0 (H) 10/13/2020 0923   LDLCALC 58 04/03/2020 0951   LDLDIRECT 66.0 10/13/2020 0923    Physical Exam:    VS:  BP (!) 104/50 (BP Location: Right Arm, Patient Position: Sitting)    Pulse (!) 102    Ht 5\' 11"  (1.803 m)    Wt 194 lb (88 kg)    SpO2 94%    BMI 27.06 kg/m     Wt Readings from Last 3 Encounters:  03/10/21 194 lb (88 kg)  02/19/21 187 lb 12.8 oz (85.2 kg)  02/06/21 184 lb (83.5 kg)     GEN:  Well nourished, well developed in no acute distress HEENT: Normal NECK: No JVD; No carotid bruits LYMPHATICS: No lymphadenopathy CARDIAC: RRR, no murmurs, no rubs, no gallops RESPIRATORY:  Clear to auscultation without rales, wheezing or rhonchi  ABDOMEN: Soft, non-tender, non-distended MUSCULOSKELETAL:  No edema; No deformity  SKIN: Warm and dry LOWER EXTREMITIES: no swelling NEUROLOGIC:  Alert and oriented x 3 PSYCHIATRIC:  Normal affect   ASSESSMENT:    1. Paroxysmal atrial fibrillation (HCC)   2. Coronary artery disease involving native coronary artery of native heart without angina pectoris   3. IPF (idiopathic pulmonary fibrosis) ([place])   4. Dyslipidemia, goal LDL below 70   5. Dyspnea on exertion    PLAN:    In order of problems listed above:  Paroxysmal atrial fibrillation maintaining sinus rhythm he is anticoagulated which I will continue.  Denies have any palpitations. Coronary  disease stable from that point review.  Denies have any new reactivation of the problem. Idiopathic pulmonary fibrosis clearly bleeding probably right now.  Clearly he is getting worse.  He followed by pulmonary. Dyslipidemia I did review his K PN which show me his LDL of 58 HDL 28 this is from this year.   Medication Adjustments/Labs and Tests Ordered: Current medicines are reviewed at length with the patient today.  Concerns regarding medicines are outlined above.  No orders of the defined types were placed in this encounter.  Medication changes: No orders of the defined types were placed in this encounter.   Signed, Park Liter, MD, [facility] 03/10/2021 2:23 PM    [place]

## 2021-03-10 NOTE — Patient Instructions (Signed)

## 2021-03-16 ENCOUNTER — Other Ambulatory Visit: Payer: Self-pay | Admitting: Medical

## 2021-03-18 DIAGNOSIS — F4322 Adjustment disorder with anxiety: Secondary | ICD-10-CM | POA: Diagnosis not present

## 2021-03-18 DIAGNOSIS — F331 Major depressive disorder, recurrent, moderate: Secondary | ICD-10-CM | POA: Diagnosis not present

## 2021-04-15 ENCOUNTER — Ambulatory Visit: Payer: Medicare Other | Admitting: Medical

## 2021-04-15 DIAGNOSIS — H524 Presbyopia: Secondary | ICD-10-CM | POA: Diagnosis not present

## 2021-04-16 DIAGNOSIS — G4733 Obstructive sleep apnea (adult) (pediatric): Secondary | ICD-10-CM | POA: Diagnosis not present

## 2021-04-20 ENCOUNTER — Encounter: Payer: Self-pay | Admitting: Medical

## 2021-04-20 ENCOUNTER — Ambulatory Visit (INDEPENDENT_AMBULATORY_CARE_PROVIDER_SITE_OTHER): Payer: Medicare Other | Admitting: Medical

## 2021-04-20 ENCOUNTER — Ambulatory Visit (HOSPITAL_BASED_OUTPATIENT_CLINIC_OR_DEPARTMENT_OTHER)
Admission: RE | Admit: 2021-04-20 | Discharge: 2021-04-20 | Disposition: A | Discharge status: Home / Self Care | Payer: Medicare Other | Source: Ambulatory Visit | Attending: Medical | Admitting: Medical

## 2021-04-20 ENCOUNTER — Other Ambulatory Visit: Payer: Self-pay

## 2021-04-20 VITALS — BP 108/70 | HR 80 | Temp 98.2°F | Ht 71.0 in | Wt 187.0 lb

## 2021-04-20 DIAGNOSIS — R0781 Pleurodynia: Secondary | ICD-10-CM

## 2021-04-20 DIAGNOSIS — R1011 Right upper quadrant pain: Secondary | ICD-10-CM

## 2021-04-20 DIAGNOSIS — R110 Nausea: Secondary | ICD-10-CM | POA: Diagnosis not present

## 2021-04-20 DIAGNOSIS — F419 Anxiety disorder, unspecified: Secondary | ICD-10-CM

## 2021-04-20 DIAGNOSIS — F32A Depression, unspecified: Secondary | ICD-10-CM

## 2021-04-20 DIAGNOSIS — M5136 Other intervertebral disc degeneration, lumbar region: Secondary | ICD-10-CM | POA: Diagnosis not present

## 2021-04-20 DIAGNOSIS — K824 Cholesterolosis of gallbladder: Secondary | ICD-10-CM | POA: Diagnosis not present

## 2021-04-20 DIAGNOSIS — R748 Abnormal levels of other serum enzymes: Secondary | ICD-10-CM

## 2021-04-20 LAB — COMPREHENSIVE METABOLIC PANEL
ALT: 18 U/L (ref 0–53)
AST: 16 U/L (ref 0–37)
Albumin: 4.1 g/dL (ref 3.5–5.2)
Alkaline Phosphatase: 84 U/L (ref 39–117)
BUN: 12 mg/dL (ref 6–23)
CO2: 24 mEq/L (ref 19–32)
Calcium: 9.8 mg/dL (ref 8.4–10.5)
Chloride: 101 mEq/L (ref 96–112)
Creatinine, Ser: 0.86 mg/dL (ref 0.40–1.50)
GFR: 87.39 mL/min (ref >60.00)
Glucose, Bld: 112 mg/dL — ABNORMAL HIGH (ref 70–99)
Potassium: 4.4 mEq/L (ref 3.5–5.1)
Sodium: 135 mEq/L (ref 135–145)
Total Bilirubin: 0.6 mg/dL (ref 0.2–1.2)
Total Protein: 7.3 g/dL (ref 6.0–8.3)

## 2021-04-20 LAB — CBC WITH DIFFERENTIAL/PLATELET
Basophils Absolute: 0 10*3/uL (ref 0.0–0.1)
Basophils Relative: 0.4 % (ref 0.0–3.0)
Eosinophils Absolute: 0.1 10*3/uL (ref 0.0–0.7)
Eosinophils Relative: 1.8 % (ref 0.0–5.0)
HCT: 48.5 % (ref 39.0–52.0)
Hemoglobin: 16 g/dL (ref 13.0–17.0)
Lymphocytes Relative: 17 % (ref 12.0–46.0)
Lymphs Abs: 1.4 10*3/uL (ref 0.7–4.0)
MCHC: 33.1 g/dL (ref 30.0–36.0)
MCV: 95.8 fl (ref 78.0–100.0)
Monocytes Absolute: 1.1 10*3/uL — ABNORMAL HIGH (ref 0.1–1.0)
Monocytes Relative: 14 % — ABNORMAL HIGH (ref 3.0–12.0)
Neutro Abs: 5.3 10*3/uL (ref 1.4–7.7)
Neutrophils Relative %: 66.8 % (ref 43.0–77.0)
Platelets: 255 10*3/uL (ref 150.0–400.0)
RBC: 5.06 Mil/uL (ref 4.22–5.81)
RDW: 14 % (ref 11.5–15.5)
WBC: 8 10*3/uL (ref 4.0–10.5)

## 2021-04-20 LAB — LIPASE: Lipase: 71 U/L — ABNORMAL HIGH (ref 11.0–59.0)

## 2021-04-20 MED ORDER — TRAZODONE HCL 50 MG PO TABS: 25.0000 mg | ORAL_TABLET | Freq: Every evening | ORAL | 3 refills | Status: DC | PRN

## 2021-04-20 NOTE — Addendum Note (Signed)
Addended by: Anabel Halon on: 04/20/2021 07:04 PM   Modules accepted: Orders

## 2021-04-20 NOTE — Progress Notes (Signed)
Subjective:    Patient ID: Robert Lang, male    DOB: 1950/08/26, 71 y.o.   MRN: [id number]  HPI  Pt in for follow up.  Pt has some rt upper quadrant area/rlower rib area pain. Pain that occurs randomly. Sometimes notes pain when he sleep. Pain occurring over past week. Pain during day and night. He states feels like under ribs and he points to lower rib mid clavicular level.   He has some upset stomach and belching. History of heart burn and belching per wife. Pt thinks no obvious. Pt is on famotadine 40 mg at night. No vomiting. Some nausea at time.   Pt not having any midsternal/chest region pain.  No trauma or fall recently.  Pt did take tyelnol the other night and pain did resolve.    Pt also seeing psychiatrist for depression. Pt is on sertraline 100 mg daily and buspar 15 mg 2 tab po bid. Pt is still on wellbutrin. Also on trazadone.   Review of Systems  Constitutional:  Negative for chills, fatigue and fever.  HENT:  Negative for congestion, drooling and ear discharge.   Respiratory:  Negative for cough, chest tightness, shortness of breath and wheezing.   Gastrointestinal:  Positive for nausea. Negative for abdominal distention, abdominal pain, constipation, diarrhea and vomiting.  Genitourinary:  Negative for dysuria, flank pain and frequency.  Musculoskeletal:  Negative for back pain and myalgias.  Skin:  Negative for rash.  Neurological:  Negative for facial asymmetry, light-headedness and numbness.  Psychiatric/Behavioral:  Negative for behavioral problems, confusion and hallucinations.     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 M75.00)   Allergic rhinitis due to pollen 09/03/2013   J30.1)  Formatting of this note might be different from the original.  J30.1)   Allergy    Anticoagulated 07/01/2016   Anxiety    Arthritis    Atrial fibrillation ([place])    Atrial flutter ([place]) 06/26/2015   CAD (coronary artery disease)    Chest pain 06/26/2015   COPD (chronic obstructive pulmonary disease) ([place])    Coronary artery disease 07/06/2018   Cardiac catheterization 2017 showing 90% small diagonal branch disease   DDD (degenerative disc disease), cervical 09/03/2013   M50.90)  Formatting of this note might be different from the original. M50.90)   Depression    Depression    Depressive disorder 09/03/2013   Dizziness 10/28/2017   Dyslipidemia, goal LDL below 70 07/06/2018   Dyspnea on exertion 10/20/2018   Esophageal reflux 09/03/2013   Essential hypertension 07/06/2018   ETOH abuse 01/11/2019   6 pack of beer per day   Falls 10/28/2017   GERD (gastroesophageal reflux disease)    H/O amiodarone therapy 07/01/2016   Heart attack ([place])    Heart palpitations 01/27/2017   History of colon polyps    Hyperlipidemia    Hypertension    IPF (idiopathic pulmonary fibrosis) ([place])    Kidney stones    Neck pain 09/03/2013   Neuropathy 07/06/2018   Obstructive sleep apnea 08/05/2015   PLMD (periodic limb movement disorder) 11/04/2015   Polycythemia, 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 STORY: Due to alcohol/ tobacco   Pure hypercholesterolemia 09/03/2013   E78.0)  Formatting of this  note might be different from the original. E78.0)   Screening for prostate cancer 09/03/2013   Smoker 01/11/2019   Smoking 07/06/2018   Status post ablation of atrial flutter 07/06/2018   2017     Social History   Socioeconomic History   Marital status: Married    Spouse name: Not on file   Number of children: Not on file   Years of education: Not on file   Highest education level: Not on file  Occupational History   Not on file  Tobacco Use   Smoking status: Former     Packs/day: 1.00    Years: 30.00    Pack years: 30.00    Types: Cigarettes    Quit date: 01/10/2019    Years since quitting: 2.2   Smokeless tobacco: Former  Scientific laboratory technician Use: Never used  Substance and Sexual Activity   Alcohol use: Yes    Comment: 8oz of wine a day   Drug use: Never   Sexual activity: Not on file  Other Topics Concern   Not on file  Social History Narrative   Not on file   Social Determinants of Health   Financial Resource Strain: Low Risk    Difficulty of Paying Living Expenses: Not hard at all  Food Insecurity: No Food Insecurity   Worried About Charity fundraiser in the Last Year: Never true   [place] in the Last Year: Never true  Transportation Needs: No Transportation Needs   Lack of Transportation (Medical): No   Lack of Transportation (Non-Medical): No  Physical Activity: Inactive   Days of Exercise per Week: 0 days   Minutes of Exercise per Session: 0 min  Stress: No Stress Concern Present   Feeling of Stress : Not at all  Social Connections: Moderately Integrated   Frequency of Communication with Friends and Family: More than three times a week   Frequency of Social Gatherings with Friends and Family: More than three times a week   Attends Religious Services: More than 4 times per year   Active Member of Clubs or Organizations: No   Attends Archivist Meetings: Never   Marital Status: Married  Human resources officer Violence: Not At Risk   Fear of Current or Ex-Partner: No   Emotionally Abused: No   Physically Abused: No   Sexually Abused: No    Past Surgical History:  Procedure Laterality Date   ATRIAL FIBRILLATION ABLATION  10/2015   BACK SURGERY     CARDIOVERSION  2017   CATARACT EXTRACTION Bilateral 2017   March and April 2017   COLONOSCOPY  2018   CORONARY STENT PLACEMENT  2012   CORONARY/GRAFT ACUTE MI REVASCULARIZATION N/A 01/10/2019   Procedure: CORONARY/GRAFT ACUTE MI REVASCULARIZATION;  Surgeon: Leonie Man, MD;  Location: [facility];  Service: Cardiovascular;  Laterality: N/A;   INGUINAL HERNIA REPAIR     over 20 years ago   LAPAROSCOPIC APPENDECTOMY N/A 01/17/2021   Procedure: APPENDECTOMY LAPAROSCOPIC;  Surgeon: Felicie Morn, MD;  Location: [facility];  Service: General;  Laterality: N/A;   LEFT HEART CATH AND CORONARY ANGIOGRAPHY N/A 01/10/2019   Procedure: LEFT HEART CATH AND CORONARY ANGIOGRAPHY;  Surgeon: Leonie Man, MD;  Location: [facility];  Service: Cardiovascular;  Laterality: N/A;   LUMBAR LAMINECTOMY Bilateral 04/05/2016   L2-L5    VENTRAL HERNIA REPAIR  2018    Family History  Problem Relation Age of Onset  Colon cancer Father    Throat cancer Brother     Allergies  Allergen Reactions   Naproxen Other (See Comments)    (Naprosyn *ANALGESICS - ANTI-INFLAMMATORY*) Nausea, Abdominal pain    Current Outpatient Medications on File Prior to Visit  Medication Sig Dispense Refill   acetaminophen (TYLENOL) 325 MG tablet Take 2 tablets (650 mg total) by mouth every 6 (six) hours as needed.     atorvastatin (LIPITOR) 80 MG tablet Take 1 tablet (80 mg total) by mouth at bedtime. 90 tablet 3   benzonatate (TESSALON) 100 MG capsule Take 1 capsule (100 mg total) by mouth 3 (three) times daily as needed for cough. 30 capsule 0   buPROPion (WELLBUTRIN XL) 300 MG 24 hr tablet Take 1 tablet (300 mg total) by mouth daily. 90 tablet 0   busPIRone (BUSPAR) 7.5 MG tablet Take 1 tablet (7.5 mg total) by mouth 2 (two) times daily. 60 tablet 0   Cholecalciferol 25 MCG (1000 UT) tablet Take 1,000 Units by mouth daily.      docusate sodium (COLACE) 100 MG capsule Take 100 mg by mouth at bedtime.     enalapril (VASOTEC) 2.5 MG tablet Take 2.5 mg by mouth daily.     famotidine (PEPCID) 40 MG tablet Take 1 tablet (40 mg total) by mouth at bedtime. 90 tablet 3   fexofenadine (ALLEGRA) 180 MG tablet Take 180 mg by mouth at bedtime.      finasteride (PROSCAR) 5 MG  tablet Take 1 tablet (5 mg total) by mouth daily. 90 tablet 1   fluticasone (FLONASE) 50 MCG/ACT nasal spray Place 1 spray into both nostrils daily as needed for allergies or rhinitis.     gabapentin (NEURONTIN) 100 MG capsule TAKE 4 CAPSULES BY MOUTH AT BEDTIME 120 capsule 3   Ginger, Zingiber officinalis, (GINGER ROOT) 550 MG CAPS Take 550 mg by mouth daily.     ibuprofen (ADVIL) 200 MG tablet Take 200 mg by mouth every 6 (six) hours as needed for headache or moderate pain.     melatonin 5 MG TABS Take 10 mg by mouth at bedtime.     Multiple Vitamins-Minerals (PRESERVISION AREDS PO) Take 1 capsule by mouth in the morning and at bedtime.     nitroGLYCERIN (NITROSTAT) 0.4 MG SL tablet Place 1 tablet (0.4 mg total) under the tongue every 5 (five) minutes as needed for chest pain. 25 tablet 3   Oxycodone HCl 10 MG TABS Take 1 tablet (10 mg total) by mouth every 6 (six) hours as needed for severe pain (not relieved by tylenol). 28 tablet 0   pantoprazole (PROTONIX) 40 MG tablet Take 40 mg by mouth daily.     Pirfenidone (ESBRIET) 801 MG TABS Take 801 mg by mouth 3 (three) times daily. 90 tablet 5   Polyethylene Glycol 3350 (MIRALAX PO) Take 17 g by mouth at bedtime.     ranolazine (RANEXA) 1000 MG SR tablet Take 1 tablet (1,000 mg total) by mouth 2 (two) times daily. 180 tablet 1   rivaroxaban (XARELTO) 20 MG TABS tablet Take 1 tablet (20 mg total) by mouth daily with supper. TAKE 1 TABLET(20 MG) BY MOUTH DAILY WITH SUPPER Strength: 20 mg 30 tablet 12   sertraline (ZOLOFT) 100 MG tablet Take 100 mg by mouth at bedtime.     solifenacin (VESICARE) 5 MG tablet Take 5 mg by mouth at bedtime.     traZODone (DESYREL) 50 MG tablet TAKE 1/2 TO 1 TABLET(25 TO 50 MG)  BY MOUTH AT BEDTIME AS NEEDED FOR SLEEP (Patient taking differently: Take 50 mg by mouth at bedtime.) 30 tablet 3   Turmeric 500 MG CAPS Take 1 capsule by mouth 2 (two) times daily.      vitamin B-12 (CYANOCOBALAMIN) 1000 MCG tablet Take 1,000 mcg  by mouth daily.     No current facility-administered medications on file prior to visit.    BP 108/70    Pulse 80    Temp 98.2 F (36.8 C)    Ht 5\' 11"  (1.803 m)    Wt 187 lb (84.8 kg)    SpO2 98%    BMI 26.08 kg/m        Objective:   Physical Exam  General Mental Status- Alert. General Appearance- Not in acute distress.   Skin General: Color- Normal Color. Moisture- Normal Moisture.  Neck Carotid Arteries- Normal color. Moisture- Normal Moisture. No carotid bruits. No JVD.  Chest and Lung Exam Auscultation: Breath Sounds:-Normal.  Cardiovascular Auscultation:Rythm- Regular. Murmurs & Other Heart Sounds:Auscultation of the heart reveals- No Murmurs.  Abdomen Inspection:-Inspeection Normal. Palpation/Percussion:Note:No mass. Palpation and Percussion of the abdomen reveal- mild-moderate point  Tender over rt upper quadrant, Non Distended + BS, no rebound or guarding.   Neurologic Cranial Nerve exam:- CN III-XII intact(No nystagmus), symmetric smile. Strength:- 5/5 equal and symmetric strength both upper and lower extremities.   Rt lower rib are- mid clavicular region tender to palpation over lower rib.    Skin-no rash in region of right upper quadrant or back.     Assessment & Plan:   Patient Instructions  Right lower rib and right upper quadrant region pain.  Difficult to determine presently etiology of your symptoms.  Sometimes pain appears muscular when you move.  But on exam you do have some mild tenderness directly over lower rib region and right upper quadrant region.  However no pain after eating.  On exam no rash or blister erection indicating shingles though that could be in differential.  Presently will get right series, CBC, CMP, lipase and place order for abdomen ultrasound.  Abdomen ultrasound might have to be scheduled later in the day as typically they want you to be fasting prior to that imaging study.  Can use Tylenol presently for pain but caution  on dosages.  Recommended to continue famotidine.  Depression anxiety stable.  Continue current med regimen.  Did refill your trazodone.  We discussed that psychiatry would manage your psychiatric medications.  Refilled trazodone today since you are about to run out.  Follow-up date to be determined after lab and imaging review.    Mackie Pai, [name]

## 2021-04-20 NOTE — Patient Instructions (Addendum)
Right lower rib and right upper quadrant region pain.  Difficult to determine presently etiology of your symptoms.  Sometimes pain appears muscular when you move.  But on exam you do have some mild tenderness directly over lower rib region and right upper quadrant region.  However no pain after eating.  On exam no rash or blister erection indicating shingles though that could be in differential.  Presently will get right series, CBC, CMP, lipase and place order for abdomen ultrasound.  Abdomen ultrasound might have to be scheduled later in the day as typically they want you to be fasting prior to that imaging study.  Can use Tylenol presently for pain but caution on dosages.  Recommended to continue famotidine.  Depression anxiety stable.  Continue current med regimen.  Did refill your trazodone.  We discussed that psychiatry would manage your psychiatric medications.  Refilled trazodone today since you are about to run out.  Follow-up date to be determined after lab and imaging review.

## 2021-04-21 ENCOUNTER — Other Ambulatory Visit: Payer: Self-pay | Admitting: Cardiology

## 2021-04-21 NOTE — Telephone Encounter (Signed)
Prescription refill request for Xarelto received.  Indication: afib  Last office visit: Drue Dun, 03/10/2021 Weight: 84.8 kg  Age: 71 yo  Scr: 0.86, 04/20/2020 CrCl: 94 ml/min   Refill sent.

## 2021-04-27 ENCOUNTER — Other Ambulatory Visit (INDEPENDENT_AMBULATORY_CARE_PROVIDER_SITE_OTHER): Payer: Medicare Other

## 2021-04-27 ENCOUNTER — Encounter: Payer: Self-pay | Admitting: Medical

## 2021-04-27 ENCOUNTER — Telehealth: Payer: Self-pay

## 2021-04-27 DIAGNOSIS — R748 Abnormal levels of other serum enzymes: Secondary | ICD-10-CM | POA: Diagnosis not present

## 2021-04-27 LAB — LIPASE: Lipase: 70 U/L — ABNORMAL HIGH (ref 11.0–59.0)

## 2021-04-27 NOTE — Telephone Encounter (Signed)
Pt came in today for labs , told me he is still having pain in ribs but its now radiating to his back now , declined appointment today since he was in the office already .

## 2021-04-27 NOTE — Telephone Encounter (Signed)
Offered appt with you or anybody else today and he declined and then he was in for just labs

## 2021-05-01 ENCOUNTER — Other Ambulatory Visit: Payer: Self-pay | Admitting: Cardiology

## 2021-05-05 ENCOUNTER — Ambulatory Visit (INDEPENDENT_AMBULATORY_CARE_PROVIDER_SITE_OTHER): Payer: Medicare Other | Admitting: Medical

## 2021-05-05 DIAGNOSIS — R110 Nausea: Secondary | ICD-10-CM

## 2021-05-05 DIAGNOSIS — R748 Abnormal levels of other serum enzymes: Secondary | ICD-10-CM

## 2021-05-05 DIAGNOSIS — R0781 Pleurodynia: Secondary | ICD-10-CM | POA: Diagnosis not present

## 2021-05-05 MED ORDER — METHYLPREDNISOLONE 4 MG PO TABS: ORAL_TABLET | ORAL | 0 refills | Status: DC

## 2021-05-05 MED ORDER — ONDANSETRON HCL 4 MG PO TABS: 4.0000 mg | ORAL_TABLET | Freq: Three times a day (TID) | ORAL | 0 refills | Status: DC | PRN

## 2021-05-05 NOTE — Progress Notes (Signed)
Subjective:    Patient ID: Robert Lang, male    DOB: October 31, 1950, 71 y.o.   MRN: [id number]  HPI  Pt in for follow up. He is still  having some rt upper quadrant region pain and rib area pain. See prior notes. The work up showed mild elevated lipase, [place] of abdomen showed gallbladder polyp and xray showed No acute lower right rib fracture( Chronic mild healed deformities in the anterolateral right tenth and eleventh ribs.)   Pt pain described by pt as mixture of dull and sharp pain. Can be in front rt lower rib and be in back at times as well. No rash reported.   On reviewed of body of the report does have T-12 vertebral.  Pt is having some nausea intermittent. He can't necessarily associate rt lower rib area pin after eating.    Review of Systems  Constitutional:  Negative for chills, fatigue and fever.  HENT:  Negative for congestion and drooling.   Respiratory:  Negative for cough, chest tightness, shortness of breath and wheezing.   Cardiovascular:  Negative for chest pain and palpitations.  Gastrointestinal:  Negative for abdominal pain and blood in stool.  Genitourinary:  Negative for dysuria, flank pain and frequency.  Musculoskeletal:  Negative for back pain.       Rt lower rib region back with pain at times intermittent in back area as well  Skin:  Negative for pallor and rash.  Neurological:  Negative for facial asymmetry, weakness and headaches.  Hematological:  Negative for adenopathy. Does not bruise/bleed easily.  Psychiatric/Behavioral:  Negative for behavioral problems, decreased concentration and dysphoric mood. The patient is not nervous/anxious.     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 M75.00)   Allergic rhinitis due to pollen 09/03/2013   J30.1)   Formatting of this note might be different from the original. J30.1)   Allergy    Anticoagulated 07/01/2016   Anxiety    Arthritis    Atrial fibrillation ([place])    Atrial flutter ([place]) 06/26/2015   CAD (coronary artery disease)    Chest pain 06/26/2015   COPD (chronic obstructive pulmonary disease) ([place])    Coronary artery disease 07/06/2018   Cardiac catheterization 2017 showing 90% small diagonal branch disease   DDD (degenerative disc disease), cervical 09/03/2013   M50.90)  Formatting of this note might be different from the original. M50.90)   Depression    Depression    Depressive disorder 09/03/2013   Dizziness 10/28/2017   Dyslipidemia, goal LDL below 70 07/06/2018   Dyspnea on exertion 10/20/2018   Esophageal reflux 09/03/2013   Essential hypertension 07/06/2018   ETOH abuse 01/11/2019   6 pack of beer per day   Falls 10/28/2017   GERD (gastroesophageal reflux disease)    H/O amiodarone therapy 07/01/2016   Heart attack ([place])    Heart palpitations 01/27/2017   History of colon polyps    Hyperlipidemia    Hypertension    IPF (idiopathic pulmonary fibrosis) ([place])    Kidney stones    Neck pain 09/03/2013   Neuropathy 07/06/2018   Obstructive sleep apnea 08/05/2015   PLMD (periodic limb movement disorder) 11/04/2015   Polycythemia, 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 STORY:  Due to alcohol/ tobacco   Pure hypercholesterolemia 09/03/2013   E78.0)  Formatting of this note might be different from the original. E78.0)   Screening for prostate cancer 09/03/2013   Smoker 01/11/2019   Smoking 07/06/2018   Status post ablation of atrial flutter 07/06/2018   2017     Social History   Socioeconomic History   Marital status: Married    Spouse name: Not on file   Number of children: Not on file   Years of education: Not on file   Highest education level: Not on file  Occupational History    Not on file  Tobacco Use   Smoking status: Former    Packs/day: 1.00    Years: 30.00    Pack years: 30.00    Types: Cigarettes    Quit date: 01/10/2019    Years since quitting: 2.3   Smokeless tobacco: Former  Scientific laboratory technician Use: Never used  Substance and Sexual Activity   Alcohol use: Yes    Comment: 8oz of wine a day   Drug use: Never   Sexual activity: Not on file  Other Topics Concern   Not on file  Social History Narrative   Not on file   Social Determinants of Health   Financial Resource Strain: Low Risk    Difficulty of Paying Living Expenses: Not hard at all  Food Insecurity: No Food Insecurity   Worried About Charity fundraiser in the Last Year: Never true   Ran Out of Food in the Last Year: Never true  Transportation Needs: No Transportation Needs   Lack of Transportation (Medical): No   Lack of Transportation (Non-Medical): No  Physical Activity: Inactive   Days of Exercise per Week: 0 days   Minutes of Exercise per Session: 0 min  Stress: No Stress Concern Present   Feeling of Stress : Not at all  Social Connections: Moderately Integrated   Frequency of Communication with Friends and Family: More than three times a week   Frequency of Social Gatherings with Friends and Family: More than three times a week   Attends Religious Services: More than 4 times per year   Active Member of Clubs or Organizations: No   Attends Archivist Meetings: Never   Marital Status: Married  Human resources officer Violence: Not At Risk   Fear of Current or Ex-Partner: No   Emotionally Abused: No   Physically Abused: No   Sexually Abused: No    Past Surgical History:  Procedure Laterality Date   ATRIAL FIBRILLATION ABLATION  10/2015   BACK SURGERY     CARDIOVERSION  2017   CATARACT EXTRACTION Bilateral 2017   March and April 2017   COLONOSCOPY  2018   CORONARY STENT PLACEMENT  2012   CORONARY/GRAFT ACUTE MI REVASCULARIZATION N/A 01/10/2019   Procedure:  CORONARY/GRAFT ACUTE MI REVASCULARIZATION;  Surgeon: Leonie Man, MD;  Location: [facility];  Service: Cardiovascular;  Laterality: N/A;   INGUINAL HERNIA REPAIR     over 20 years ago   LAPAROSCOPIC APPENDECTOMY N/A 01/17/2021   Procedure: APPENDECTOMY LAPAROSCOPIC;  Surgeon: Felicie Morn, MD;  Location: [facility];  Service: General;  Laterality: N/A;   LEFT HEART CATH AND CORONARY ANGIOGRAPHY N/A 01/10/2019   Procedure: LEFT HEART CATH AND CORONARY ANGIOGRAPHY;  Surgeon: Leonie Man, MD;  Location: [facility];  Service: Cardiovascular;  Laterality: N/A;   LUMBAR LAMINECTOMY Bilateral 04/05/2016   L2-L5  VENTRAL HERNIA REPAIR  2018    Family History  Problem Relation Age of Onset   Colon cancer Father    Throat cancer Brother     Allergies  Allergen Reactions   Naproxen Other (See Comments)    (Naprosyn *ANALGESICS - ANTI-INFLAMMATORY*) Nausea, Abdominal pain    Current Outpatient Medications on File Prior to Visit  Medication Sig Dispense Refill   acetaminophen (TYLENOL) 325 MG tablet Take 2 tablets (650 mg total) by mouth every 6 (six) hours as needed.     atorvastatin (LIPITOR) 80 MG tablet Take 1 tablet (80 mg total) by mouth at bedtime. 90 tablet 3   buPROPion (WELLBUTRIN XL) 300 MG 24 hr tablet Take 1 tablet (300 mg total) by mouth daily. 90 tablet 0   busPIRone (BUSPAR) 7.5 MG tablet Take 1 tablet (7.5 mg total) by mouth 2 (two) times daily. 60 tablet 0   Cholecalciferol 25 MCG (1000 UT) tablet Take 1,000 Units by mouth daily.      docusate sodium (COLACE) 100 MG capsule Take 100 mg by mouth at bedtime.     enalapril (VASOTEC) 2.5 MG tablet TAKE 1 TABLET(2.5 MG) BY MOUTH DAILY 90 tablet 3   famotidine (PEPCID) 40 MG tablet Take 1 tablet (40 mg total) by mouth at bedtime. 90 tablet 3   fexofenadine (ALLEGRA) 180 MG tablet Take 180 mg by mouth at bedtime.      finasteride (PROSCAR) 5 MG tablet Take 1 tablet (5 mg total) by mouth daily. 90 tablet  1   fluticasone (FLONASE) 50 MCG/ACT nasal spray Place 1 spray into both nostrils daily as needed for allergies or rhinitis.     gabapentin (NEURONTIN) 100 MG capsule TAKE 4 CAPSULES BY MOUTH AT BEDTIME 120 capsule 3   Ginger, Zingiber officinalis, (GINGER ROOT) 550 MG CAPS Take 550 mg by mouth daily.     ibuprofen (ADVIL) 200 MG tablet Take 200 mg by mouth every 6 (six) hours as needed for headache or moderate pain.     melatonin 5 MG TABS Take 10 mg by mouth at bedtime.     Multiple Vitamins-Minerals (PRESERVISION AREDS PO) Take 1 capsule by mouth in the morning and at bedtime.     nitroGLYCERIN (NITROSTAT) 0.4 MG SL tablet Place 1 tablet (0.4 mg total) under the tongue every 5 (five) minutes as needed for chest pain. 25 tablet 3   Oxycodone HCl 10 MG TABS Take 1 tablet (10 mg total) by mouth every 6 (six) hours as needed for severe pain (not relieved by tylenol). 28 tablet 0   pantoprazole (PROTONIX) 40 MG tablet Take 40 mg by mouth daily.     Pirfenidone (ESBRIET) 801 MG TABS Take 801 mg by mouth 3 (three) times daily. 90 tablet 5   Polyethylene Glycol 3350 (MIRALAX PO) Take 17 g by mouth at bedtime.     ranolazine (RANEXA) 1000 MG SR tablet Take 1 tablet (1,000 mg total) by mouth 2 (two) times daily. 180 tablet 1   rivaroxaban (XARELTO) 20 MG TABS tablet TAKE 1 TABLET(20 MG) BY MOUTH DAILY WITH SUPPER 30 tablet 5   sertraline (ZOLOFT) 100 MG tablet Take 100 mg by mouth at bedtime.     solifenacin (VESICARE) 5 MG tablet Take 5 mg by mouth at bedtime.     traZODone (DESYREL) 50 MG tablet Take 0.5-1 tablets (25-50 mg total) by mouth at bedtime as needed for sleep. 30 tablet 3   Turmeric 500 MG CAPS Take 1 capsule by mouth  2 (two) times daily.      vitamin B-12 (CYANOCOBALAMIN) 1000 MCG tablet Take 1,000 mcg by mouth daily.     No current facility-administered medications on file prior to visit.    BP 120/67    Pulse 94    Resp 18    Ht 5\' 11"  (1.803 m)    Wt 198 lb (89.8 kg)    SpO2 95%     BMI 27.62 kg/m       Objective:   Physical Exam  General Mental Status- Alert. General Appearance- Not in acute distress.   Skin General: Color- Normal Color. Moisture- Normal Moisture.  Neck Carotid Arteries- Normal color. Moisture- Normal Moisture. No carotid bruits. No JVD.  Chest and Lung Exam Auscultation: Breath Sounds:-Normal.  Cardiovascular Auscultation:Rythm- Regular. Murmurs & Other Heart Sounds:Auscultation of the heart reveals- No Murmurs.  Abdomen Inspection:-Inspeection Normal. Palpation/Percussion:Note:No mass. Palpation and Percussion of the abdomen reveal- Non Tender, Non Distended + BS, no rebound or guarding.  Neurologic Cranial Nerve exam:- CN III-XII intact(No nystagmus), symmetric smile. Strength:- 5/5 equal and symmetric strength both upper and lower extremities.   Back-no cva tenderness. No mid spinal pain.  Anterior thorax- rt lower rib area tenderness to palpation.     Assessment & Plan:   Patient Instructions  Mild lipase elevated so may have had recent mild pancreatitis. Will repeat lipase again today. If again elevated will likely proceed with ct abd pelvis to visualize pancrease. May also refer to GI MD as well.  Rt rib abnormality chronic mild healed deformities in the anterolateral right tenth and eleventh ribs. After review of above labs might refer to sport med MD as want to know if MSK is contributor to your pain. (Also note  t-12 vertebroplasty.)  Zofran for nausea. Continue to not drink alcohol. Recommend bland food/avoid fatty foods. You mentioned that tylenol has helped with rib area pain. Rx 4 day taper medrol to see impact on your pain.  Follow up date to be determined after lab review.        Mackie Pai, [name]

## 2021-05-05 NOTE — Addendum Note (Signed)
Addended by: Anabel Halon on: 05/05/2021 08:32 AM   Modules accepted: Orders

## 2021-05-05 NOTE — Patient Instructions (Addendum)
Mild lipase elevated so may have had recent mild pancreatitis. Will repeat lipase again today. If again elevated will likely proceed with ct abd pelvis to visualize pancrease. May also refer to GI MD as well.  Rt rib abnormality chronic mild healed deformities in the anterolateral right tenth and eleventh ribs. After review of above labs might refer to sport med MD as want to know if MSK is contributor to your pain. (Also note  t-12 vertebroplasty.)  Zofran for nausea. Continue to not drink alcohol. Recommend bland food/avoid fatty foods. You mentioned that tylenol has helped with rib area pain. Rx 4 day taper medrol to see impact on your pain.  Follow up date to be determined after lab review.

## 2021-05-06 LAB — COMPREHENSIVE METABOLIC PANEL
ALT: 17 U/L (ref 0–53)
AST: 14 U/L (ref 0–37)
Albumin: 4 g/dL (ref 3.5–5.2)
Alkaline Phosphatase: 69 U/L (ref 39–117)
BUN: 14 mg/dL (ref 6–23)
CO2: 25 mEq/L (ref 19–32)
Calcium: 9.6 mg/dL (ref 8.4–10.5)
Chloride: 102 mEq/L (ref 96–112)
Creatinine, Ser: 0.82 mg/dL (ref 0.40–1.50)
GFR: 88.62 mL/min (ref >60.00)
Glucose, Bld: 122 mg/dL — ABNORMAL HIGH (ref 70–99)
Potassium: 4.3 mEq/L (ref 3.5–5.1)
Sodium: 134 mEq/L — ABNORMAL LOW (ref 135–145)
Total Bilirubin: 0.5 mg/dL (ref 0.2–1.2)
Total Protein: 7 g/dL (ref 6.0–8.3)

## 2021-05-06 LAB — LIPASE: Lipase: 78 U/L — ABNORMAL HIGH (ref 11.0–59.0)

## 2021-05-07 ENCOUNTER — Encounter: Payer: Self-pay | Admitting: Medical

## 2021-05-07 NOTE — Addendum Note (Signed)
Addended by: Anabel Halon on: 05/07/2021 01:58 PM   Modules accepted: Orders

## 2021-05-07 NOTE — Addendum Note (Signed)
Addended by: Anabel Halon on: 05/07/2021 08:10 AM   Modules accepted: Orders

## 2021-05-13 ENCOUNTER — Other Ambulatory Visit: Payer: Self-pay | Admitting: Gastroenterology

## 2021-05-13 ENCOUNTER — Emergency Department (HOSPITAL_COMMUNITY): Payer: Medicare Other

## 2021-05-13 ENCOUNTER — Emergency Department (HOSPITAL_COMMUNITY)
Admission: EM | Admit: 2021-05-13 | Discharge: 2021-05-14 | Disposition: A | Discharge status: Home / Self Care | Payer: Medicare Other | Attending: Emergency Medicine | Admitting: Emergency Medicine

## 2021-05-13 ENCOUNTER — Encounter (HOSPITAL_COMMUNITY): Payer: Self-pay

## 2021-05-13 ENCOUNTER — Other Ambulatory Visit: Payer: Self-pay

## 2021-05-13 DIAGNOSIS — R1011 Right upper quadrant pain: Secondary | ICD-10-CM | POA: Insufficient documentation

## 2021-05-13 DIAGNOSIS — Z20822 Contact with and (suspected) exposure to covid-19: Secondary | ICD-10-CM | POA: Diagnosis not present

## 2021-05-13 DIAGNOSIS — R748 Abnormal levels of other serum enzymes: Secondary | ICD-10-CM | POA: Insufficient documentation

## 2021-05-13 DIAGNOSIS — R1114 Bilious vomiting: Secondary | ICD-10-CM | POA: Diagnosis not present

## 2021-05-13 DIAGNOSIS — R109 Unspecified abdominal pain: Secondary | ICD-10-CM | POA: Diagnosis not present

## 2021-05-13 DIAGNOSIS — D1803 Hemangioma of intra-abdominal structures: Secondary | ICD-10-CM | POA: Diagnosis not present

## 2021-05-13 DIAGNOSIS — I700 Atherosclerosis of aorta: Secondary | ICD-10-CM | POA: Diagnosis not present

## 2021-05-13 DIAGNOSIS — R110 Nausea: Secondary | ICD-10-CM | POA: Diagnosis not present

## 2021-05-13 LAB — CBC WITH DIFFERENTIAL/PLATELET
Abs Immature Granulocytes: 0.08 10*3/uL — ABNORMAL HIGH (ref 0.00–0.07)
Basophils Absolute: 0.1 10*3/uL (ref 0.0–0.1)
Basophils Relative: 1 %
Eosinophils Absolute: 0 10*3/uL (ref 0.0–0.5)
Eosinophils Relative: 0 %
HCT: 46.2 % (ref 39.0–52.0)
Hemoglobin: 15.1 g/dL (ref 13.0–17.0)
Immature Granulocytes: 1 %
Lymphocytes Relative: 22 %
Lymphs Abs: 2.1 10*3/uL (ref 0.7–4.0)
MCH: 32.1 pg (ref 26.0–34.0)
MCHC: 32.7 g/dL (ref 30.0–36.0)
MCV: 98.3 fL (ref 80.0–100.0)
Monocytes Absolute: 1 10*3/uL (ref 0.1–1.0)
Monocytes Relative: 10 %
Neutro Abs: 6.6 10*3/uL (ref 1.7–7.7)
Neutrophils Relative %: 66 %
Platelets: 210 10*3/uL (ref 150–400)
RBC: 4.7 MIL/uL (ref 4.22–5.81)
RDW: 12.8 % (ref 11.5–15.5)
WBC: 10 10*3/uL (ref 4.0–10.5)
nRBC: 0 % (ref 0.0–0.2)

## 2021-05-13 LAB — COMPREHENSIVE METABOLIC PANEL
ALT: 23 U/L (ref 0–44)
AST: 19 U/L (ref 15–41)
Albumin: 3.5 g/dL (ref 3.5–5.0)
Alkaline Phosphatase: 65 U/L (ref 38–126)
Anion gap: 9 (ref 5–15)
BUN: 14 mg/dL (ref 8–23)
CO2: 22 mmol/L (ref 22–32)
Calcium: 8.8 mg/dL — ABNORMAL LOW (ref 8.9–10.3)
Chloride: 102 mmol/L (ref 98–111)
Creatinine, Ser: 0.89 mg/dL (ref 0.61–1.24)
GFR, Estimated: >60 mL/min (ref >60)
Glucose, Bld: 117 mg/dL — ABNORMAL HIGH (ref 70–99)
Potassium: 4.2 mmol/L (ref 3.5–5.1)
Sodium: 133 mmol/L — ABNORMAL LOW (ref 135–145)
Total Bilirubin: 0.4 mg/dL (ref 0.3–1.2)
Total Protein: 6.9 g/dL (ref 6.5–8.1)

## 2021-05-13 LAB — LIPASE, BLOOD: Lipase: 61 U/L — ABNORMAL HIGH (ref 11–51)

## 2021-05-13 LAB — RESP PANEL BY RT-PCR (FLU A&B, COVID) ARPGX2
Influenza A by PCR: NEGATIVE
Influenza B by PCR: NEGATIVE
SARS Coronavirus 2 by RT PCR: NEGATIVE

## 2021-05-13 MED ORDER — IOHEXOL 300 MG/ML  SOLN
100.0000 mL | Freq: Once | INTRAMUSCULAR | Status: AC | PRN
  Administered 2021-05-13: 100 mL via INTRAVENOUS

## 2021-05-13 MED ORDER — OXYCODONE-ACETAMINOPHEN 5-325 MG PO TABS
2.0000 | ORAL_TABLET | Freq: Once | ORAL | Status: AC
  Administered 2021-05-13: 2 via ORAL
  Filled 2021-05-13: qty 2

## 2021-05-13 MED ORDER — ONDANSETRON 4 MG PO TBDP
4.0000 mg | ORAL_TABLET | Freq: Once | ORAL | Status: AC
  Administered 2021-05-13: 4 mg via ORAL
  Filled 2021-05-13: qty 1

## 2021-05-13 MED ORDER — FENTANYL CITRATE PF 50 MCG/ML IJ SOSY
50.0000 ug | PREFILLED_SYRINGE | Freq: Once | INTRAMUSCULAR | Status: AC
  Administered 2021-05-13: 50 ug via INTRAVENOUS
  Filled 2021-05-13: qty 1

## 2021-05-13 NOTE — ED Provider Triage Note (Signed)
Emergency Medicine Provider Triage Evaluation Note  Robert Lang , a 71 y.o. male  was evaluated in triage.  Pt complains of right upper quadrant abdominal pain.  He has had intermittent episodes of severe right upper quadrant domino pain going on and off for the past few months.  He seen his primary care several times.  He has had severe pain radiating to his back all day on the right side with associated nausea, he does not know any aggravating or alleviating symptoms.  His PCP thinks he is having problems with his gallbladder.  Review of Systems  Positive: Right upper quadrant abdominal pain Negative: Fever  Physical Exam  BP 125/73 (BP Location: Left Arm)    Pulse 87    Temp 98 F (36.7 C)    Resp 15    Ht 5\' 11"  (1.803 m)    Wt 83.9 kg    SpO2 96%    BMI 25.80 kg/m  Gen:   Awake, no distress   Resp:  Normal effort  MSK:   Moves extremities without difficulty  Other:  Appears to be in significant pain, positive Murphy sign on the right  Medical Decision Making  Medically screening exam initiated at 10:14 PM.  Appropriate orders placed.  Zaeden Lastinger was informed that the remainder of the evaluation will be completed by another provider, this initial triage assessment does not replace that evaluation, and the importance of remaining in the ED until their evaluation is complete.  Labs work-up and imaging ordered   Margarita Mail, Hershal Coria 05/13/21 2215

## 2021-05-13 NOTE — ED Provider Notes (Signed)
[facility] Emergency Department Provider Note MRN:  [id number]  Arrival date & time: 05/14/21     Chief Complaint   Abdominal Pain   History of Present Illness   Robert Lang is a 71 y.o. year-old male presents to the ED with chief complaint of right upper quadrant abdominal pain for the past several weeks.  He states that it worsened today.  He reports associated nausea, but denies vomiting or fever.  He has had ultrasounds and x-ray, but never had a CT scan.  His PCP told him that he had a mildly elevated lipase at his last visit.  History provided by patient. Additional independent history provided by spouse, who states that patient is scheduled to see GI the 2nd week in March.  Review of Systems  Pertinent review of systems noted in HPI.    Physical Exam   Vitals:   05/14/21 0045 05/14/21 0100  BP:  116/65  Pulse: 77 72  Resp: 15 14  Temp:    SpO2: 92% 95%    CONSTITUTIONAL:  uncomfortable-appearing, NAD NEURO:  Alert and oriented x 3, CN 3-12 grossly intact EYES:  eyes equal and reactive ENT/NECK:  Supple, no stridor  CARDIO:  normal rate, regular rhythm, appears well-perfused  PULM:  No respiratory distress, CTAB GI/GU:  non-distended,  MSK/SPINE:  No gross deformities, no edema, moves all extremities  SKIN:  no rash, atraumatic   *Additional and/or pertinent findings included in MDM below  Diagnostic and Interventional Summary    EKG Interpretation  Date/Time:  Thursday May 14 2021 00:15:15 EST Ventricular Rate:  72 PR Interval:  279 QRS Duration: 123 QT Interval:  419 QTC Calculation: 459 R Axis:   -52 Text Interpretation: Sinus rhythm Prolonged PR interval Right bundle branch block Confirmed by Thayer Jew [phone]) on 05/14/2021 12:17:21 AM       Labs Reviewed  CBC WITH DIFFERENTIAL/PLATELET - Abnormal; Notable for the following components:      Result Value   Abs Immature Granulocytes 0.08 (*)    All other  components within normal limits  COMPREHENSIVE METABOLIC PANEL - Abnormal; Notable for the following components:   Sodium 133 (*)    Glucose, Bld 117 (*)    Calcium 8.8 (*)    All other components within normal limits  LIPASE, BLOOD - Abnormal; Notable for the following components:   Lipase 61 (*)    All other components within normal limits  URINALYSIS, ROUTINE W REFLEX MICROSCOPIC - Abnormal; Notable for the following components:   Specific Gravity, Urine 1.045 (*)    All other components within normal limits  RESP PANEL BY RT-PCR (FLU A&B, COVID) [id number]    CT ABDOMEN PELVIS W CONTRAST  Final Result    US ABDOMEN LIMITED RUQ (LIVER/GB)  Final Result      Medications  sucralfate (CARAFATE) tablet 1 g (has no administration in time range)  oxyCODONE-acetaminophen (PERCOCET/ROXICET) 5-325 MG per tablet 2 tablet (2 tablets Oral Given 05/13/21 2217)  ondansetron (ZOFRAN-ODT) disintegrating tablet 4 mg (4 mg Oral Given 05/13/21 2217)  fentaNYL (SUBLIMAZE) injection 50 mcg (50 mcg Intravenous Given 05/13/21 2325)  iohexol (OMNIPAQUE) 300 MG/ML solution 100 mL (100 mLs Intravenous Contrast Given 05/13/21 2336)     Procedures  /  Critical Care Procedures  ED Course and Medical Decision Making  I have reviewed the triage vital signs, the nursing notes, and pertinent available records from the EMR.  Complexity of Problems Addressed: High Complexity: Acute illness/injury posing a  threat to life or bodily function, requiring emergent diagnostic workup, evaluation, and treatment as below.  ED Course: After considering the following differential, cholecystitis, cholelithiasis, kidney stone, hepatitis, I ordered CT abdomen/pelvis and agree with additional orders placed in triage. I personally interpreted the labs which are notable for no leukocytosis, mildly elevated lipase, but this is about baseline for patient, urinalysis is without evidence of infection or blood. I visualized the CT, which  is notable for no evidence of kidney stone and agree with radiologist interpretation. I visualized the [place] which is notable for no gallstones and agree with radiologist interpretation EKG shows no ischemic changes .     Social Determinants Affecting Care:     Consultants: No consultations were needed in caring for this patient.  Treatment and Plan:  I considered admission due to patient's initial presentation, but after considering the examination and diagnostic results, patient will not require admission and can be discharged with outpatient follow-up.  Patient discussed with attending physician, Dr. Dina Rich, who recommends trialing patient on treatment for ulcer.  Will add carafate and continue protonix.  Final Clinical Impressions(s) / ED Diagnoses     ICD-10-CM   1. RUQ abdominal pain  R10.11     2. Bilious emesis  R11.14 US ABDOMEN LIMITED RUQ (LIVER/GB)    US ABDOMEN LIMITED RUQ (LIVER/GB)      ED Discharge Orders          Ordered    sucralfate (CARAFATE) 1 g tablet  3 times daily with meals & bedtime        05/14/21 0104              Discharge Instructions Discussed with and Provided to Patient:     Discharge Instructions      Continue taking the Protonix.  Add Carafate as prescribed.    Follow-up with gastroenterology.       Montine Circle, [name] 05/14/21 0112    Merryl Hacker, MD 05/14/21 [phone]

## 2021-05-13 NOTE — ED Notes (Signed)
Patient transported to Ultrasound 

## 2021-05-13 NOTE — ED Notes (Signed)
Patient transported to CT 

## 2021-05-13 NOTE — ED Triage Notes (Signed)
Pt presents to the ED from home with complaints of RUQ abd pain x1 month, seeing Dr. Luberta Mutter for same and had CT scan, no better tonight and pain continues to get worse.

## 2021-05-14 LAB — URINALYSIS, ROUTINE W REFLEX MICROSCOPIC
Bilirubin Urine: NEGATIVE
Glucose, UA: NEGATIVE mg/dL
Hgb urine dipstick: NEGATIVE
Ketones, ur: NEGATIVE mg/dL
Leukocytes,Ua: NEGATIVE
Nitrite: NEGATIVE
Protein, ur: NEGATIVE mg/dL
Specific Gravity, Urine: 1.045 — ABNORMAL HIGH (ref 1.005–1.030)
pH: 5 (ref 5.0–8.0)

## 2021-05-14 MED ORDER — SUCRALFATE 1 G PO TABS
1.0000 g | ORAL_TABLET | Freq: Once | ORAL | Status: AC
Start: 2021-05-14 — End: 2021-05-14
  Administered 2021-05-14: 1 g via ORAL
  Filled 2021-05-14: qty 1

## 2021-05-14 MED ORDER — SUCRALFATE 1 G PO TABS: 1.0000 g | ORAL_TABLET | Freq: Three times a day (TID) | ORAL | 0 refills | Status: DC

## 2021-05-14 NOTE — Discharge Instructions (Signed)
Continue taking the Protonix.  Add Carafate as prescribed.    Follow-up with gastroenterology.

## 2021-05-18 ENCOUNTER — Ambulatory Visit (INDEPENDENT_AMBULATORY_CARE_PROVIDER_SITE_OTHER): Payer: Medicare Other | Admitting: Medical

## 2021-05-18 VITALS — BP 108/60 | HR 80 | Temp 98.2°F | Resp 18 | Ht 71.0 in | Wt 187.0 lb

## 2021-05-18 DIAGNOSIS — R1011 Right upper quadrant pain: Secondary | ICD-10-CM | POA: Diagnosis not present

## 2021-05-18 DIAGNOSIS — R1013 Epigastric pain: Secondary | ICD-10-CM

## 2021-05-18 DIAGNOSIS — R748 Abnormal levels of other serum enzymes: Secondary | ICD-10-CM

## 2021-05-18 LAB — CBC WITH DIFFERENTIAL/PLATELET
Basophils Absolute: 0 10*3/uL (ref 0.0–0.1)
Basophils Relative: 0.6 % (ref 0.0–3.0)
Eosinophils Absolute: 0.1 10*3/uL (ref 0.0–0.7)
Eosinophils Relative: 1.9 % (ref 0.0–5.0)
HCT: 46.1 % (ref 39.0–52.0)
Hemoglobin: 15.1 g/dL (ref 13.0–17.0)
Lymphocytes Relative: 18.5 % (ref 12.0–46.0)
Lymphs Abs: 1.3 10*3/uL (ref 0.7–4.0)
MCHC: 32.8 g/dL (ref 30.0–36.0)
MCV: 96.7 fl (ref 78.0–100.0)
Monocytes Absolute: 0.9 10*3/uL (ref 0.1–1.0)
Monocytes Relative: 13.4 % — ABNORMAL HIGH (ref 3.0–12.0)
Neutro Abs: 4.5 10*3/uL (ref 1.4–7.7)
Neutrophils Relative %: 65.6 % (ref 43.0–77.0)
Platelets: 239 10*3/uL (ref 150.0–400.0)
RBC: 4.77 Mil/uL (ref 4.22–5.81)
RDW: 13.9 % (ref 11.5–15.5)
WBC: 6.9 10*3/uL (ref 4.0–10.5)

## 2021-05-18 LAB — COMPREHENSIVE METABOLIC PANEL
ALT: 16 U/L (ref 0–53)
AST: 15 U/L (ref 0–37)
Albumin: 4 g/dL (ref 3.5–5.2)
Alkaline Phosphatase: 74 U/L (ref 39–117)
BUN: 11 mg/dL (ref 6–23)
CO2: 31 mEq/L (ref 19–32)
Calcium: 9.6 mg/dL (ref 8.4–10.5)
Chloride: 99 mEq/L (ref 96–112)
Creatinine, Ser: 0.92 mg/dL (ref 0.40–1.50)
GFR: 83.91 mL/min (ref >60.00)
Glucose, Bld: 106 mg/dL — ABNORMAL HIGH (ref 70–99)
Potassium: 4.6 mEq/L (ref 3.5–5.1)
Sodium: 135 mEq/L (ref 135–145)
Total Bilirubin: 0.5 mg/dL (ref 0.2–1.2)
Total Protein: 7 g/dL (ref 6.0–8.3)

## 2021-05-18 LAB — LIPASE: Lipase: 57 U/L (ref 11.0–59.0)

## 2021-05-18 NOTE — Patient Instructions (Signed)
Epigastric and right upper quadrant pain.  Describing less pain compared to the ED visit.  Responding to sucralfate, Protonix and Zofran.  Continue current BP med regimen above and eat bland diet.  We will get CBC, CMP and lipase repeat.  Appears clinically stable presently so did not order stat.  Stool test for blood today came back negative.  Let me know if your signs symptoms change or worsen.  If you start to note any black or bloody stools let me know and I could inform GI MD. If severe or worsening pain again as previously described then recommend ED evaluation.  Follow-up as scheduled with GI MD on March 14 or sooner with me if needed.

## 2021-05-18 NOTE — Progress Notes (Signed)
Subjective:    Patient ID: Robert Lang, male    DOB: [birth year], 71 y.o.   MRN: [id number]  HPI Pt in for follow up from the ED.  Arrival date & time: 05/14/21      Chief Complaint              Abdominal Pain   History of Present Illness              Robert Lang is a 71 y.o. year-old male presents to the ED with chief complaint of right upper quadrant abdominal pain for the past several weeks.  He states that it worsened today.  He reports associated nausea, but denies vomiting or fever.  He has had ultrasounds and x-ray, but never had a CT scan.  His PCP told him that he had a mildly elevated lipase at his last visit.   History provided by patient. Additional independent history provided by spouse, who states that patient is scheduled to see GI the 2nd week in March.  Pt had worsened pain from prior visit with myself on  04-20-2021 and 05-05-2021.  ED A/P  "High Complexity: Acute illness/injury posing a threat to life or bodily function, requiring emergent diagnostic workup, evaluation, and treatment as below.   ED Course: After considering the following differential, cholecystitis, cholelithiasis, kidney stone, hepatitis, I ordered CT abdomen/pelvis and agree with additional orders placed in triage. I personally interpreted the labs which are notable for no leukocytosis, mildly elevated lipase, but this is about baseline for patient, urinalysis is without evidence of infection or blood. I visualized the CT, which is notable for no evidence of kidney stone and agree with radiologist interpretation. I visualized the [place] which is notable for no gallstones and agree with radiologist interpretation EKG shows no ischemic changes ."  Pt is now on sucraflate qid and on zofran every 8 hours. This did decrease pain. Pt on Saturday he used oxycodone and helped little. Since then has not used.   US  IMPRESSION: Stable hemangioma in the liver.   No other focal abnormality is  noted.  Ct abd/pelvis  IMPRESSION: Chronic changes as described above. No acute abnormality to correspond with the given clinical history is noted   Pt appt with GI MD is June 02, 2021.     Review of Systems  Constitutional:  Negative for chills, fatigue and fever.  HENT:  Negative for congestion and drooling.   Respiratory:  Negative for chest tightness, shortness of breath and wheezing.   Cardiovascular:  Negative for chest pain and palpitations.  Gastrointestinal:  Positive for abdominal pain and nausea. Negative for diarrhea and vomiting.       No black or blood stools.  Last bowel movemment this morning.  Genitourinary:  Negative for dysuria and frequency.  Musculoskeletal:  Negative for back pain and neck pain.  Skin:  Negative for rash.  Neurological:  Negative for tremors, facial asymmetry and headaches.    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 M75.00)   Allergic rhinitis due to pollen 09/03/2013   J30.1)  Formatting of this note might be different from the original. J30.1)   Allergy    Anticoagulated 07/01/2016   Anxiety    Arthritis    Atrial fibrillation ([place])    Atrial flutter ([place]) 06/26/2015  CAD (coronary artery disease)    Chest pain 06/26/2015   COPD (chronic obstructive pulmonary disease) (HCC)    Coronary artery disease 07/06/2018   Cardiac catheterization 2017 showing 90% small diagonal branch disease   DDD (degenerative disc disease), cervical 09/03/2013   M50.90)  Formatting of this note might be different from the original. M50.90)   Depression    Depression    Depressive disorder 09/03/2013   Dizziness 10/28/2017   Dyslipidemia, goal LDL below 70 07/06/2018   Dyspnea on exertion 10/20/2018   Esophageal reflux 09/03/2013   Essential  hypertension 07/06/2018   ETOH abuse 01/11/2019   6 pack of beer per day   Falls 10/28/2017   GERD (gastroesophageal reflux disease)    H/O amiodarone therapy 07/01/2016   Heart attack ([place])    Heart palpitations 01/27/2017   History of colon polyps    Hyperlipidemia    Hypertension    IPF (idiopathic pulmonary fibrosis) ([place])    Kidney stones    Neck pain 09/03/2013   Neuropathy 07/06/2018   Obstructive sleep apnea 08/05/2015   PLMD (periodic limb movement disorder) 11/04/2015   Polycythemia, 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 STORY: Due to alcohol/ tobacco   Pure hypercholesterolemia 09/03/2013   E78.0)  Formatting of this note might be different from the original. E78.0)   Screening for prostate cancer 09/03/2013   Smoker 01/11/2019   Smoking 07/06/2018   Status post ablation of atrial flutter 07/06/2018   2017     Social History   Socioeconomic History   Marital status: Married    Spouse name: Not on file   Number of children: Not on file   Years of education: Not on file   Highest education level: Not on file  Occupational History   Not on file  Tobacco Use   Smoking status: Former    Packs/day: 1.00    Years: 30.00    Pack years: 30.00    Types: Cigarettes    Quit date: 01/10/2019    Years since quitting: 2.3   Smokeless tobacco: Former  Scientific laboratory technician Use: Never used  Substance and Sexual Activity   Alcohol use: Yes    Comment: 8oz of wine a day   Drug use: Never   Sexual activity: Not on file  Other Topics Concern   Not on file  Social History Narrative   Not on file   Social Determinants of Health   Financial Resource Strain: Low Risk    Difficulty of Paying Living Expenses: Not hard at all  Food Insecurity: No Food Insecurity   Worried About Charity fundraiser in the Last Year: Never true   Arboriculturist in the Last Year: Never true  Transportation  Needs: No Transportation Needs   Lack of Transportation (Medical): No   Lack of Transportation (Non-Medical): No  Physical Activity: Inactive   Days of Exercise per Week: 0 days   Minutes of Exercise per Session: 0 min  Stress: No Stress Concern Present   Feeling of Stress : Not at all  Social Connections: Moderately Integrated   Frequency of Communication with Friends and Family: More than three times a week   Frequency of Social Gatherings with Friends and Family: More than three times a week   Attends Religious Services: More than 4 times per year   Active Member of Clubs or Organizations: No  Attends Archivist Meetings: Never   Marital Status: Married  Human resources officer Violence: Not At Risk   Fear of Current or Ex-Partner: No   Emotionally Abused: No   Physically Abused: No   Sexually Abused: No    Past Surgical History:  Procedure Laterality Date   ATRIAL FIBRILLATION ABLATION  10/2015   BACK SURGERY     CARDIOVERSION  2017   CATARACT EXTRACTION Bilateral 2017   March and April 2017   COLONOSCOPY  2018   CORONARY STENT PLACEMENT  2012   CORONARY/GRAFT ACUTE MI REVASCULARIZATION N/A 01/10/2019   Procedure: CORONARY/GRAFT ACUTE MI REVASCULARIZATION;  Surgeon: Leonie Man, MD;  Location: [facility];  Service: Cardiovascular;  Laterality: N/A;   INGUINAL HERNIA REPAIR     over 20 years ago   LAPAROSCOPIC APPENDECTOMY N/A 01/17/2021   Procedure: APPENDECTOMY LAPAROSCOPIC;  Surgeon: Felicie Morn, MD;  Location: [facility];  Service: General;  Laterality: N/A;   LEFT HEART CATH AND CORONARY ANGIOGRAPHY N/A 01/10/2019   Procedure: LEFT HEART CATH AND CORONARY ANGIOGRAPHY;  Surgeon: Leonie Man, MD;  Location: [facility];  Service: Cardiovascular;  Laterality: N/A;   LUMBAR LAMINECTOMY Bilateral 04/05/2016   L2-L5    VENTRAL HERNIA REPAIR  2018    Family History  Problem Relation Age of Onset   Colon cancer Father    Throat cancer  Brother     Allergies  Allergen Reactions   Naproxen Other (See Comments)    (Naprosyn *ANALGESICS - ANTI-INFLAMMATORY*) Nausea, Abdominal pain    Current Outpatient Medications on File Prior to Visit  Medication Sig Dispense Refill   acetaminophen (TYLENOL) 325 MG tablet Take 2 tablets (650 mg total) by mouth every 6 (six) hours as needed.     atorvastatin (LIPITOR) 80 MG tablet Take 1 tablet (80 mg total) by mouth at bedtime. 90 tablet 3   buPROPion (WELLBUTRIN XL) 300 MG 24 hr tablet Take 1 tablet (300 mg total) by mouth daily. 90 tablet 0   busPIRone (BUSPAR) 7.5 MG tablet Take 1 tablet (7.5 mg total) by mouth 2 (two) times daily. 60 tablet 0   Cholecalciferol 25 MCG (1000 UT) tablet Take 1,000 Units by mouth daily.      docusate sodium (COLACE) 100 MG capsule Take 100 mg by mouth at bedtime.     enalapril (VASOTEC) 2.5 MG tablet TAKE 1 TABLET(2.5 MG) BY MOUTH DAILY 90 tablet 3   famotidine (PEPCID) 40 MG tablet Take 1 tablet (40 mg total) by mouth at bedtime. 90 tablet 3   fexofenadine (ALLEGRA) 180 MG tablet Take 180 mg by mouth at bedtime.      finasteride (PROSCAR) 5 MG tablet Take 1 tablet (5 mg total) by mouth daily. 90 tablet 1   fluticasone (FLONASE) 50 MCG/ACT nasal spray Place 1 spray into both nostrils daily as needed for allergies or rhinitis.     gabapentin (NEURONTIN) 100 MG capsule TAKE 4 CAPSULES BY MOUTH AT BEDTIME 120 capsule 3   Ginger, Zingiber officinalis, (GINGER ROOT) 550 MG CAPS Take 550 mg by mouth daily.     ibuprofen (ADVIL) 200 MG tablet Take 200 mg by mouth every 6 (six) hours as needed for headache or moderate pain.     melatonin 5 MG TABS Take 10 mg by mouth at bedtime.     methylPREDNISolone (MEDROL) 4 MG tablet 4 tab po day 1, 3 tab po day 2, 2 tab po day 3 and 1 tab  po day 4 10 tablet 0   Multiple Vitamins-Minerals (PRESERVISION AREDS PO) Take 1 capsule by mouth in the morning and at bedtime.     nitroGLYCERIN (NITROSTAT) 0.4 MG SL tablet Place 1  tablet (0.4 mg total) under the tongue every 5 (five) minutes as needed for chest pain. 25 tablet 3   ondansetron (ZOFRAN) 4 MG tablet Take 1 tablet (4 mg total) by mouth every 8 (eight) hours as needed for nausea or vomiting. 20 tablet 0   Oxycodone HCl 10 MG TABS Take 1 tablet (10 mg total) by mouth every 6 (six) hours as needed for severe pain (not relieved by tylenol). 28 tablet 0   pantoprazole (PROTONIX) 40 MG tablet TAKE 1 TABLET(40 MG) BY MOUTH DAILY 90 tablet 0   Pirfenidone (ESBRIET) 801 MG TABS Take 801 mg by mouth 3 (three) times daily. 90 tablet 5   Polyethylene Glycol 3350 (MIRALAX PO) Take 17 g by mouth at bedtime.     ranolazine (RANEXA) 1000 MG SR tablet Take 1 tablet (1,000 mg total) by mouth 2 (two) times daily. 180 tablet 1   rivaroxaban (XARELTO) 20 MG TABS tablet TAKE 1 TABLET(20 MG) BY MOUTH DAILY WITH SUPPER 30 tablet 5   sertraline (ZOLOFT) 100 MG tablet Take 100 mg by mouth at bedtime.     solifenacin (VESICARE) 5 MG tablet Take 5 mg by mouth at bedtime.     sucralfate (CARAFATE) 1 g tablet Take 1 tablet (1 g total) by mouth 4 (four) times daily -  with meals and at bedtime. 120 tablet 0   traZODone (DESYREL) 50 MG tablet Take 0.5-1 tablets (25-50 mg total) by mouth at bedtime as needed for sleep. 30 tablet 3   Turmeric 500 MG CAPS Take 1 capsule by mouth 2 (two) times daily.      vitamin B-12 (CYANOCOBALAMIN) 1000 MCG tablet Take 1,000 mcg by mouth daily.     No current facility-administered medications on file prior to visit.    BP 108/60    Pulse 80    Temp 98.2 F (36.8 C)    Resp 18    Ht 5\' 11"  (1.803 m)    Wt 187 lb (84.8 kg)    SpO2 97%    BMI 26.08 kg/m       Objective:   Physical Exam  General Mental Status- Alert. General Appearance- Not in acute distress.   Skin General: Color- Normal Color. Moisture- Normal Moisture.  Neck Carotid Arteries- Normal color. Moisture- Normal Moisture. No carotid bruits. No JVD.  Chest and Lung  Exam Auscultation: Breath Sounds:-Normal.  Cardiovascular Auscultation:Rythm- Regular. Murmurs & Other Heart Sounds:Auscultation of the heart reveals- No Murmurs.  Abdomen Inspection:-Inspeection Normal. Palpation/Percussion:Note:No mass. Palpation and Percussion of the abdomen reveal-mild right upper quadrant tender, faint epigastric tenderness Non Distended + BS, no rebound or guarding.  Neurologic Cranial Nerve exam:- CN III-XII intact(No nystagmus), symmetric smile. Strength:- 5/5 equal and symmetric strength both upper and lower extremities.   Rectal- on exam stool test negative for blood.     Assessment & Plan:   Patient Instructions  Epigastric and right upper quadrant pain.  Describing less pain compared to the ED visit.  Responding to sucralfate, Protonix and Zofran.  Continue current BP med regimen above and eat bland diet.  We will get CBC, CMP and lipase repeat.  Appears clinically stable presently so did not order stat.  Stool test for blood today came back negative.  Let me know if your signs  symptoms change or worsen.  If you start to note any black or bloody stools let me know and I could inform GI MD. If severe or worsening pain again as previously described then recommend ED evaluation.  Follow-up as scheduled with GI MD on March 14 or sooner with me if needed.

## 2021-05-20 ENCOUNTER — Ambulatory Visit: Payer: Medicare Other | Admitting: Internal Medicine

## 2021-05-20 NOTE — Progress Notes (Signed)
HPI ?M former smoker(30 pk yrs) followed for OSA, complicated by CAD/ MI, AFIB, HTN, Allergic Rhinitis, COPD, ILD, Nocturnal Hypoxemia,Lung Nodules (Dr Chase Caller), Degenertive Disc Disease, ETOH, Hyperlipidemia, ?HST 06/10/20- AHI 31/ hr, saturation 80%-89%, body weight 194 lbs ? ?================================================================ ? ? ?11/20/20- 26 yoM former smoker(30 pk yrs) followed for OSA, complicated by CAD/ MI, AFIB, HTN, Allergic Rhinitis, COPD, ILD, Nocturnal Hypoxemia,Lung Nodules (Dr Chase Caller), Degenertive Disc Disease, ETOH, Hyperlipidemia, ?HST 06/10/20- AHI 31/ hr, saturation 80%-89%, body weight 194 lbs ?CPAP auto 5-20/ [facility] ordered 6/22   Coming now for required ov on CPAP AirSense 11 AutoSet ?Download- compliance 100%, AHI 1.3/ hr ?Body weight today-186 lbs ?Covid vax- 4 Phizer ?Began ginger root for nausea from [place] ?Reports good start on CPAP. Comfortable and learning to sleep with it.  ?Being evaluated for vocal weakness.  ? ?05/21/21- 71 yoM former smoker(30 pk yrs) followed for OSA, complicated by CAD/ MI, AFIB, HTN, Allergic Rhinitis, COPD, ILD, Nocturnal Hypoxemia,Lung Nodules (Dr Chase Caller), Degenerative Disc Disease, ETOH, Hyperlipidemia, ?CPAP auto 5-20/ [facility]             AirSense 11 AutoSet ?Download- compliance 97%, AHI 1.4/ hr ?Body weight today-187 lbs ?Covid vax- 4 Phizer ?Flu- had ?He reports doing well with his CPAP.  Spring pollen causes mild increased nasal congestion and he is using a nasal mask but this does not seem limiting.  He is more concerned about approaching the hot humid weather which is hard for his breathing.  Does not have oxygen.  Self paid for a power scooter.  Pending follow-up with Dr. Chase Caller for general pulmonary. ?ED visit for abdominal pain and has pending appointment with GI.  Not anemic. ? ?ROS-see HPI   + = positive ?Constitutional:    weight loss, night sweats, fevers, chills, fatigue, lassitude. ?HEENT:    headaches, difficulty swallowing,  tooth/dental problems, sore throat,  ?     sneezing, itching, ear ache, nasal congestion, post nasal drip, snoring ?CV:    chest pain, orthopnea, PND, swelling in lower extremities, anasarca,                                   ?dizziness, palpitations ?Resp:   +shortness of breath with exertion or at rest.   ?             productive cough,   non-productive cough, coughing up of blood.   ?           change in color of mucus.  wheezing.   ?Skin:    rash or lesions. ?GI:  No-   heartburn, indigestion, abdominal pain, nausea, vomiting, diarrhea,  ?               change in bowel habits, loss of appetite ?GU: dysuria, change in color of urine, no urgency or frequency.   flank pain. ?MS:   joint pain, stiffness, decreased range of motion, back pain. ?Neuro-     + peripheral neuropathy ?Psych:  change in mood or affect.  depression or anxiety.   memory loss. ? ?OBJ- Physical Exam    +overweight    Arrival room air O2 sat 96% ?General- Alert, Oriented, Affect-appropriate, Distress- none acute ?Skin- rash-none, lesions- none, excoriation- none ?Lymphadenopathy- none ?Head- atraumatic ?           Eyes- Gross vision intact, PERRLA, conjunctivae and secretions clear ?  Ears- Hearing, canals-normal ?           Nose- Clear, no-Septal dev, mucus, polyps, erosion, perforation  ?           Throat- Mallampati III , mucosa clear , drainage- none, tonsils- absent,  ?+teeth ?Neck- flexible , trachea midline, no stridor , thyroid nl, carotid no bruit ?Chest - symmetrical excursion , unlabored ?          Heart/CV- RRR , no murmur , no gallop  , no rub, nl s1 s2 ?                          - JVD- none , edema- none, stasis changes- none, varices- none ?          Lung- +breathless during speech on room air. + basilar crackles. wheeze- none, cough- none , dullness-none, rub- none ?          Chest wall-  ?Abd-  ?Br/ Gen/ Rectal- Not done, not indicated ?Extrem- cyanosis- none, clubbing, none, atrophy- none, strength- nl ?Neuro- +tremor  R hand ? ? ? ?

## 2021-05-21 ENCOUNTER — Ambulatory Visit: Payer: Medicare Other | Admitting: Internal Medicine

## 2021-05-21 ENCOUNTER — Encounter: Payer: Self-pay | Admitting: Internal Medicine

## 2021-05-21 ENCOUNTER — Other Ambulatory Visit: Payer: Self-pay

## 2021-05-21 DIAGNOSIS — J84112 Idiopathic pulmonary fibrosis: Secondary | ICD-10-CM

## 2021-05-21 DIAGNOSIS — G4733 Obstructive sleep apnea (adult) (pediatric): Secondary | ICD-10-CM

## 2021-05-21 NOTE — Assessment & Plan Note (Signed)
He is breathless during conversation but does not have right heart failure signs-JVD, peripheral edema. ?He will continue to pace himself and he will follow-up as planned with Dr. Chase Caller. ?

## 2021-05-21 NOTE — Patient Instructions (Signed)
You are doing well with CPAP. We can continue auto 5-20. ? ?Follow up with Dr Chase Caller for your general pulmonary care.   Hope the GI doctor can help you soon. ? ?Please call if we can help ?

## 2021-05-21 NOTE — Assessment & Plan Note (Signed)
Benefits from CPAP with good compliance and control.  No specific concerns.  May need some help for nasal congestion if it gets worse this spring, especially at night using a nasal mask. ?Plan-continue auto 5-20 ?

## 2021-05-23 ENCOUNTER — Other Ambulatory Visit: Payer: Self-pay | Admitting: Medical

## 2021-05-27 ENCOUNTER — Telehealth: Payer: Self-pay | Admitting: Pharmacist

## 2021-05-27 NOTE — Telephone Encounter (Signed)
Received call from patient's wife regarding a new medication that PCP started. VM did not state what medication (may be sucralfate, pantoprazole, or ondansetron per review of Dr. Ernie Avena OV note from 05/18/21. Left VM requesting return call  ? ?Knox Saliva, PharmD, MPH, BCPS ?Clinical Pharmacist (Rheumatology and Pulmonology) ?

## 2021-06-01 ENCOUNTER — Encounter: Payer: Self-pay | Admitting: Gastroenterology

## 2021-06-01 ENCOUNTER — Ambulatory Visit: Payer: Medicare Other | Admitting: Gastroenterology

## 2021-06-01 ENCOUNTER — Other Ambulatory Visit: Payer: Self-pay

## 2021-06-01 ENCOUNTER — Telehealth: Payer: Self-pay

## 2021-06-01 VITALS — BP 102/68 | HR 95 | Ht 71.0 in | Wt 187.5 lb

## 2021-06-01 DIAGNOSIS — K5909 Other constipation: Secondary | ICD-10-CM | POA: Diagnosis not present

## 2021-06-01 DIAGNOSIS — R1011 Right upper quadrant pain: Secondary | ICD-10-CM | POA: Diagnosis not present

## 2021-06-01 DIAGNOSIS — K219 Gastro-esophageal reflux disease without esophagitis: Secondary | ICD-10-CM | POA: Diagnosis not present

## 2021-06-01 DIAGNOSIS — Z800 Family history of malignant neoplasm of digestive organs: Secondary | ICD-10-CM | POA: Diagnosis not present

## 2021-06-01 MED ORDER — FAMOTIDINE 40 MG PO TABS: 40.0000 mg | ORAL_TABLET | Freq: Every day | ORAL | 6 refills | Status: DC

## 2021-06-01 MED ORDER — PANTOPRAZOLE SODIUM 40 MG PO TBEC: 40.0000 mg | DELAYED_RELEASE_TABLET | Freq: Every day | ORAL | 6 refills | Status: DC

## 2021-06-01 MED ORDER — SUTAB 1479-225-188 MG PO TABS: 1.0000 | ORAL_TABLET | ORAL | 0 refills | Status: DC

## 2021-06-01 NOTE — Patient Instructions (Addendum)
If you are age 71 or older, your body mass index should be between 23-30. Your Body mass index is 26.15 kg/m?Marland Kitchen If this is out of the aforementioned range listed, please consider follow up with your Primary Care Provider. ? ?If you are age 67 or younger, your body mass index should be between 19-25. Your Body mass index is 26.15 kg/m?Marland Kitchen If this is out of the aformentioned range listed, please consider follow up with your Primary Care Provider.  ? ?________________________________________________________ ? ?The [facility] providers would like to encourage you to use [facility] to communicate with providers for non-urgent requests or questions.  Due to long hold times on the telephone, sending your provider a message by [facility] may be a faster and more efficient way to get a response.  Please allow 48 business hours for a response.  Please remember that this is for non-urgent requests.  ?_______________________________________________________ ? ?We have sent the following medications to your pharmacy for you to pick up at your convenience: ?Protonix and Pepcid ? ?You have been scheduled for an endoscopy and colonoscopy. Please follow the written instructions given to you at your visit today. ?Please pick up your prep supplies at the pharmacy within the next 1-3 days. ?If you use inhalers (even only as needed), please bring them with you on the day of your procedure. ? ?Two days before your procedure: Mix 3 packs (or capfuls) of Miralax in 48 ounces of clear liquid and drink at 6pm. ? ?You will be contacted by our office prior to your procedure for directions on holding your Xarelto.  If you do not hear from our office 1 week prior to your scheduled procedure, please call [phone] to discuss.  ? ?Please purchase the following medications over the counter and take as directed: ?Miralax 17g 2 times a day and Colace 1 tablet daily ? ?Can do heating pads at night.  ? ?Please call with an questions or concerns. ? ?Thank  you, ? ?Dr. Jackquline Denmark ? ? ?

## 2021-06-01 NOTE — Progress Notes (Signed)
Chief Complaint: Abdominal pain.  Referring Provider:  Mackie Pai, [name]      ASSESSMENT AND PLAN;   #1. RUQ pain. Likely musculoskeletal or related to back pain. Neg [place], CT AP 04/2021, neg MRI liver 02/2021. Nl CBC, CMP, lipase  #2. GERD  #2. FH colon Ca (dad>60)  #4. Chronic constipation   Plan: - Protonix '40mg'$  po qAM #30, 6 refills - Continue pepcid '40mg'$  po QHS, 30, 6 refills - EGD/colon 2 day prep off Xarelto x 24hrs and after cardio clearence - Heating pads for R chest wall pain - Miralax 17g po bid/colace 1 tab po qd until large BM.  Then can titrate MiraLAX to QD - D/W pt and pt's wife in detail.    I discussed EGD/Colonoscopy- the indications, risks, alternatives and potential complications including, but not limited to bleeding, infection, reaction to meds, damage to internal organs, cardiac and/or pulmonary problems, and perforation requiring surgery. The possibility that significant findings could be missed was explained. All ? were answered. Pt consents to proceed.  HIGHER THAN BASELINE RISK.The nature of the procedure, as well as the risks, benefits, and alternatives were carefully and thoroughly reviewed with the patient. Ample time for discussion and questions allowed. The patient understood, was satisfied, and agreed to proceed.   HPI:    Robert Lang is a 71 y.o. male  hx of CAD s/p MI 01/10/2019 s/p PCI/DES to RCA, A Fib on Xeralto, H/O atrial flutter s/p ablation 2017, HLD, H/O pericarditis, OSA, HTN, Allergic Rhinitis, COPD, ILD, Lung Nodules (Dr Chase Caller), DJD, HLD  RUQ pain x 2 months, moderate to severe after he lies down on bed. Continuous, at time sharp Nausea but no vomiting. Tender to touch. Not related to eating. Had chest x-ray to rule out rib fracture which was negative Neg [place] x 2, CT AP Feb 2023, MRI liver with contrast December 2022 Normal CBC, CMP, lipase Advised EGD to rule out gastric etiology.  Also with constipation,  especially after taking Oxy for back pain.  He only uses it sparingly.  No melena or hematochezia.  No fever chills or night sweats.  No recent weight loss.  No dysphagia.    Past GI procedures:  CT Abdo/pelvis with contrast 04/2021 Chronic changes as described above. No acute abnormality to correspond with the given clinical history is noted  RUQ [place] 04/2021 -Stable hemangioma in the liver -Normal gallbladder  Ultrasound complete 03/2021 1. No acute abnormality. 2. 3 mm gallbladder polyp. No further follow-up required  MRI liver with contrast 02/22/2021 1. Small benign hemangioma in the RIGHT hepatic lobe corresponds to indeterminate lesion on comparison CT. 2. Normal biliary tree and pancreas.  -Colonoscopy 09/2016 [facility].  Fair prep.  Sigmoid diverticulosis.  Repeat in 5 years (2023). FH colon cancer (dad >60)  SH: Moved here recently, taking care of grandkids.  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 M75.00)   Allergic rhinitis due to pollen 09/03/2013   J30.1)  Formatting of this note might be different from the original. J30.1)   Allergy    Anticoagulated 07/01/2016   Anxiety    Arthritis    Atrial fibrillation ([place])    Atrial flutter ([place]) 06/26/2015   CAD (coronary artery disease)  Chest pain 06/26/2015   COPD (chronic obstructive pulmonary disease) ([place])    Coronary artery disease 07/06/2018   Cardiac catheterization 2017 showing 90% small diagonal branch disease   DDD (degenerative disc disease), cervical 09/03/2013   M50.90)  Formatting of this note might be different from the original. M50.90)   Depression    Depression    Depressive disorder 09/03/2013   Dizziness 10/28/2017   Dyslipidemia, goal LDL  below 70 07/06/2018   Dyspnea on exertion 10/20/2018   Esophageal reflux 09/03/2013   Essential hypertension 07/06/2018   ETOH abuse 01/11/2019   6 pack of beer per day   Falls 10/28/2017   GERD (gastroesophageal reflux disease)    H/O amiodarone therapy 07/01/2016   Heart attack ([place])    Heart palpitations 01/27/2017   History of colon polyps    Hyperlipidemia    Hypertension    IPF (idiopathic pulmonary fibrosis) ([place])    Kidney stones    Neck pain 09/03/2013   Neuropathy 07/06/2018   Obstructive sleep apnea 08/05/2015   PLMD (periodic limb movement disorder) 11/04/2015   Polycythemia, 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 STORY: Due to alcohol/ tobacco   Pure hypercholesterolemia 09/03/2013   E78.0)  Formatting of this note might be different from the original. E78.0)   Screening for prostate cancer 09/03/2013   Smoker 01/11/2019   Smoking 07/06/2018   Status post ablation of atrial flutter 07/06/2018   2017    Past Surgical History:  Procedure Laterality Date   ATRIAL FIBRILLATION ABLATION  10/2015   BACK SURGERY     CARDIOVERSION  2017   CATARACT EXTRACTION Bilateral 2017   March and April 2017   COLONOSCOPY  2018   CORONARY STENT PLACEMENT  2012   CORONARY/GRAFT ACUTE MI REVASCULARIZATION N/A 01/10/2019   Procedure: CORONARY/GRAFT ACUTE MI REVASCULARIZATION;  Surgeon: Leonie Man, MD;  Location: [facility];  Service: Cardiovascular;  Laterality: N/A;   INGUINAL HERNIA REPAIR     over 20 years ago   LAPAROSCOPIC APPENDECTOMY N/A 01/17/2021   Procedure: APPENDECTOMY LAPAROSCOPIC;  Surgeon: Felicie Morn, MD;  Location: [facility];  Service: General;  Laterality: N/A;   LEFT HEART CATH AND CORONARY ANGIOGRAPHY N/A 01/10/2019   Procedure: LEFT HEART CATH AND CORONARY ANGIOGRAPHY;  Surgeon: Leonie Man, MD;  Location: [facility];  Service: Cardiovascular;   Laterality: N/A;   LUMBAR LAMINECTOMY Bilateral 04/05/2016   L2-L5    VENTRAL HERNIA REPAIR  2018    Family History  Problem Relation Age of Onset   Colon cancer Father    Throat cancer Brother     Social History   Tobacco Use   Smoking status: Former    Packs/day: 1.00    Years: 30.00    Pack years: 30.00    Types: Cigarettes    Quit date: 01/10/2019    Years since quitting: 2.3   Smokeless tobacco: Former  Scientific laboratory technician Use: Never used  Substance Use Topics   Alcohol use: Yes    Comment: 8oz of wine a day   Drug use: Never    Current Outpatient Medications  Medication Sig Dispense Refill   acetaminophen (TYLENOL) 325 MG tablet Take 2 tablets (650 mg total) by mouth every 6 (six) hours as needed.     atorvastatin (LIPITOR) 80 MG tablet Take 1 tablet (80 mg total) by mouth at bedtime.  90 tablet 3   buPROPion (WELLBUTRIN XL) 300 MG 24 hr tablet Take 1 tablet (300 mg total) by mouth daily. 90 tablet 0   busPIRone (BUSPAR) 7.5 MG tablet Take 1 tablet (7.5 mg total) by mouth 2 (two) times daily. 60 tablet 0   Cholecalciferol 25 MCG (1000 UT) tablet Take 1,000 Units by mouth daily.      docusate sodium (COLACE) 100 MG capsule Take 100 mg by mouth at bedtime.     enalapril (VASOTEC) 2.5 MG tablet TAKE 1 TABLET(2.5 MG) BY MOUTH DAILY 90 tablet 3   famotidine (PEPCID) 40 MG tablet Take 1 tablet (40 mg total) by mouth at bedtime. 90 tablet 3   fexofenadine (ALLEGRA) 180 MG tablet Take 180 mg by mouth at bedtime.      finasteride (PROSCAR) 5 MG tablet Take 1 tablet (5 mg total) by mouth daily. 90 tablet 1   fluticasone (FLONASE) 50 MCG/ACT nasal spray Place 1 spray into both nostrils daily as needed for allergies or rhinitis.     gabapentin (NEURONTIN) 100 MG capsule TAKE 4 CAPSULES BY MOUTH AT BEDTIME 120 capsule 3   Ginger, Zingiber officinalis, (GINGER ROOT) 550 MG CAPS Take 550 mg by mouth daily.     melatonin 5 MG TABS Take 10 mg by mouth at bedtime.     Multiple  Vitamins-Minerals (PRESERVISION AREDS PO) Take 1 capsule by mouth in the morning and at bedtime.     nitroGLYCERIN (NITROSTAT) 0.4 MG SL tablet Place 1 tablet (0.4 mg total) under the tongue every 5 (five) minutes as needed for chest pain. 25 tablet 3   ondansetron (ZOFRAN) 4 MG tablet TAKE 1 TABLET(4 MG) BY MOUTH EVERY 8 HOURS AS NEEDED FOR NAUSEA OR VOMITING 20 tablet 0   pantoprazole (PROTONIX) 40 MG tablet TAKE 1 TABLET(40 MG) BY MOUTH DAILY 90 tablet 0   Pirfenidone (ESBRIET) 801 MG TABS Take 801 mg by mouth 3 (three) times daily. 90 tablet 5   Polyethylene Glycol 3350 (MIRALAX PO) Take 17 g by mouth at bedtime.     ranolazine (RANEXA) 1000 MG SR tablet Take 1 tablet (1,000 mg total) by mouth 2 (two) times daily. 180 tablet 1   rivaroxaban (XARELTO) 20 MG TABS tablet TAKE 1 TABLET(20 MG) BY MOUTH DAILY WITH SUPPER 30 tablet 5   sertraline (ZOLOFT) 100 MG tablet Take 100 mg by mouth at bedtime.     solifenacin (VESICARE) 5 MG tablet Take 5 mg by mouth at bedtime.     sucralfate (CARAFATE) 1 g tablet Take 1 tablet (1 g total) by mouth 4 (four) times daily -  with meals and at bedtime. 120 tablet 0   traZODone (DESYREL) 50 MG tablet Take 0.5-1 tablets (25-50 mg total) by mouth at bedtime as needed for sleep. 30 tablet 3   Turmeric 500 MG CAPS Take 1 capsule by mouth 2 (two) times daily.      vitamin B-12 (CYANOCOBALAMIN) 1000 MCG tablet Take 1,000 mcg by mouth daily.     No current facility-administered medications for this visit.    Allergies  Allergen Reactions   Naproxen Other (See Comments)    (Naprosyn *ANALGESICS - ANTI-INFLAMMATORY*) Nausea, Abdominal pain    Review of Systems:  Constitutional: Denies fever, chills, diaphoresis, appetite change and fatigue.  HEENT: Some allergies. Respiratory: Denies SOB, DOE,, chest tightness,  and wheezing.  Has cough with hoarseness Cardiovascular: Denies chest pain, palpitations and leg swelling.  Genitourinary: Denies dysuria, urgency,  frequency, hematuria, flank pain  and difficulty urinating.  Musculoskeletal: Has myalgias, back pain, joint swelling, arthralgias and gait problem.  Skin: No rash.  Neurological: Denies dizziness, seizures, syncope, weakness, light-headedness, numbness and headaches.  Hematological: Denies adenopathy. Easy bruising, personal or family bleeding history  Psychiatric/Behavioral: No anxiety or depression.  Has been getting some forgetful.     Physical Exam:    BP 102/68    Pulse 95    Ht '5\' 11"'$  (1.803 m)    Wt 187 lb 8 oz (85 kg)    SpO2 94%    BMI 26.15 kg/m  Wt Readings from Last 3 Encounters:  06/01/21 187 lb 8 oz (85 kg)  05/21/21 187 lb 6.4 oz (85 kg)  05/18/21 187 lb (84.8 kg)   Constitutional:  Well-developed, in no acute distress. Psychiatric: Normal mood and affect. Behavior is normal. HEENT: Pupils normal.  Conjunctivae are normal. No scleral icterus. Neck supple.  Cardiovascular: Normal rate, regular rhythm. No edema Pulmonary/chest: Effort normal and breath sounds normal. No wheezing, rales or rhonchi.  Has significant right chest wall tenderness. Abdominal: Soft, nondistended. Bowel sounds active throughout. There are no masses palpable. No hepatomegaly.  Has significant right abdominal and chest wall tenderness. Rectal:  defered Neurological: Alert and oriented to person place and time. Skin: Skin is warm and dry. No rashes noted.  Data Reviewed: I have personally reviewed following labs and imaging studies  CBC: CBC Latest Ref Rng & Units 05/18/2021 05/13/2021 04/20/2021  WBC 4.0 - 10.5 K/uL 6.9 10.0 8.0  Hemoglobin 13.0 - 17.0 g/dL 15.1 15.1 16.0  Hematocrit 39.0 - 52.0 % 46.1 46.2 48.5  Platelets 150.0 - 400.0 K/uL 239.0 210 255.0    CMP: CMP Latest Ref Rng & Units 05/18/2021 05/13/2021 05/05/2021  Glucose 70 - 99 mg/dL 106(H) 117(H) 122(H)  BUN 6 - 23 mg/dL '11 14 14  '$ Creatinine 0.40 - 1.50 mg/dL 0.92 0.89 0.82  Sodium 135 - 145 mEq/L 135 133(L) 134(L)  Potassium  3.5 - 5.1 mEq/L 4.6 4.2 4.3  Chloride 96 - 112 mEq/L 99 102 102  CO2 19 - 32 mEq/L '31 22 25  '$ Calcium 8.4 - 10.5 mg/dL 9.6 8.8(L) 9.6  Total Protein 6.0 - 8.3 g/dL 7.0 6.9 7.0  Total Bilirubin 0.2 - 1.2 mg/dL 0.5 0.4 0.5  Alkaline Phos 39 - 117 U/L 74 65 69  AST 0 - 37 U/L '15 19 14  '$ ALT 0 - 53 U/L '16 23 17     '$ Carmell Austria, MD 06/01/2021, 8:45 AM  Cc: Mackie Pai, [name]

## 2021-06-01 NOTE — Telephone Encounter (Signed)
Clinical pharmacist to review Xarelto 

## 2021-06-01 NOTE — Addendum Note (Signed)
Addended by: Jackquline Denmark on: 06/01/2021 06:00 PM ? ? Modules accepted: Level of Service ? ?

## 2021-06-01 NOTE — Telephone Encounter (Signed)
Patient with diagnosis of afib on Xarelto for anticoagulation.   ? ?Procedure: EGD/colonoscopy ?Date of procedure: 07/09/21 ? ?CHA2DS2-VASc Score = 3  ?This indicates a 3.2% annual risk of stroke. ?The patient's score is based upon: ?CHF History: 0 ?HTN History: 1 ?Diabetes History: 0 ?Stroke History: 0 ?Vascular Disease History: 1 ?Age Score: 1 ?Gender Score: 0 ? ?CrCl 52m/min ?Platelet count 239K ? ?Per office protocol, patient can hold Xarelto for 1 day prior to procedure.   ?

## 2021-06-01 NOTE — Telephone Encounter (Signed)
[facility] Pre-operative Risk Assessment  ?   ?Request for surgical clearance:     Endoscopy Procedure ? ?What type of surgery is being performed?     EGD/Colon ? ?When is this surgery scheduled?     07-09-2021 ? ?What type of clearance is required ?   Pharmacy ? ?Are there any medications that need to be held prior to surgery and how long? Xarelto 24 hour hold ? ?Practice name and name of physician performing surgery?      [facility] ? ?What is your office phone and fax number?      Phone- [phone]  Fax- [phone] ? ?Anesthesia type (None, local, MAC, general) ?       MAC ? ?

## 2021-06-03 NOTE — Telephone Encounter (Signed)
Patient made aware and voiced understanding.

## 2021-06-09 ENCOUNTER — Telehealth: Payer: Self-pay

## 2021-06-09 ENCOUNTER — Other Ambulatory Visit (HOSPITAL_COMMUNITY): Payer: Self-pay

## 2021-06-09 NOTE — Telephone Encounter (Signed)
Received notification from [facility] regarding a prior authorization for PIRFENIDONE. Authorization has been APPROVED from 06/09/2021 to 06/10/2022.  ? ?Authorization # [id number] ? ? ?Will briefly await PA approval letter prior to submission, otherwise all necessary documentation has been gathered and placed in PAP pending folder. ?

## 2021-06-09 NOTE — Telephone Encounter (Signed)
Received fax from [facility] stating that they have been unsuccessful in contacting pt to ensure continued eligibility for their program. Per test claim, current PA is expiring on 06/18/21. Will submit new PA request and compile information to send in to [facility]. ? ?Submitted a Prior Authorization request to [facility] for PIRFENIDONE via CoverMyMeds. Will update once we receive a response. ? ? ?Key: [id number] ?

## 2021-06-15 ENCOUNTER — Other Ambulatory Visit: Payer: Self-pay | Admitting: Pharmacist

## 2021-06-15 DIAGNOSIS — J84112 Idiopathic pulmonary fibrosis: Secondary | ICD-10-CM

## 2021-06-15 MED ORDER — PIRFENIDONE 801 MG PO TABS: 801.0000 mg | ORAL_TABLET | Freq: Three times a day (TID) | ORAL | 1 refills | Status: DC

## 2021-06-15 NOTE — Telephone Encounter (Signed)
Received refill request for Esbriet from [facility]. ? ?Patient's dose is '801mg'$  three times daily ? ?LFTs on 05/18/21 wnl ? ?Knox Saliva, PharmD, MPH, BCPS ?Clinical Pharmacist (Rheumatology and Pulmonology) ?

## 2021-06-16 NOTE — Telephone Encounter (Signed)
Information has been submitted to [facility]. As pt has not had any major changes, he should continue to be eligible for the remainder of this year. ?

## 2021-06-22 ENCOUNTER — Other Ambulatory Visit: Payer: Self-pay | Admitting: Internal Medicine

## 2021-06-22 DIAGNOSIS — J84112 Idiopathic pulmonary fibrosis: Secondary | ICD-10-CM
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
## 2021-06-22 NOTE — Patient Instructions (Addendum)
IPF (idiopathic pulmonary fibrosis) ([place]) ?Medication monitoring encounter ?DOE (dyspnea on exertion) ? ?PFT breathing test today is giving mixed signal if worse or not ?Unclear if IPF is worse - could be. Not sure ?Mos recent LFT Feb 2023 is normal ? ? ?plan ?- get echo ? - get HRCT ?- appreciate interest in support group - email Marlane Mingle - [email or web address] ?- appreciate interst in clinical trials ?  - we can assess after completing workup for RUQ pain and finishing above workup ? - but for now take consent form daewoong study  ?- to read and reflect but we will decide after you complete all the workup ?  - meet Lauren from [facility] ? ?Small Lung nodules -feb 2022 ? ?Plan ? - capture information in above CT chest ? ?RUQ abdominal pain ? ?Unclear cause. GI working you up ? ?Plan ? - give holiday off esbriet, turmeric and ginger for 1 week ?- then restart esbriet at usual full dose and restart ginger ? - do not restart turmeric for the moment ?- continue GI workup ?- we can see if esbriet holiday helps ? ?Follow-up ?- 2-3 weeks  with APP or DR Chase Caller  to discuss CT/echo results ? - imporrtant you be seen by someone in 2-3 weeks ?

## 2021-06-23 ENCOUNTER — Ambulatory Visit (INDEPENDENT_AMBULATORY_CARE_PROVIDER_SITE_OTHER): Payer: Medicare Other | Admitting: Internal Medicine

## 2021-06-23 ENCOUNTER — Ambulatory Visit: Payer: Medicare Other | Admitting: Internal Medicine

## 2021-06-23 ENCOUNTER — Encounter: Payer: Self-pay | Admitting: Internal Medicine

## 2021-06-23 VITALS — BP 118/64 | HR 89 | Temp 97.8°F | Ht 71.0 in | Wt 180.6 lb

## 2021-06-23 DIAGNOSIS — R1011 Right upper quadrant pain: Secondary | ICD-10-CM | POA: Diagnosis not present

## 2021-06-23 DIAGNOSIS — J84112 Idiopathic pulmonary fibrosis: Secondary | ICD-10-CM

## 2021-06-23 DIAGNOSIS — Z5181 Encounter for therapeutic drug level monitoring: Secondary | ICD-10-CM | POA: Diagnosis not present

## 2021-06-23 DIAGNOSIS — R0609 Other forms of dyspnea: Secondary | ICD-10-CM

## 2021-06-23 LAB — PULMONARY FUNCTION TEST
DL/VA % pred: 79 %
DL/VA: 3.21 ml/min/mmHg/L
DLCO cor % pred: 67 %
DLCO cor: 17.94 ml/min/mmHg
DLCO unc % pred: 67 %
DLCO unc: 17.94 ml/min/mmHg
FEF 25-75 Pre: 2.25 L/sec
FEF2575-%Pred-Pre: 89 %
FEV1-%Pred-Pre: 79 %
FEV1-Pre: 2.64 L
FEV1FVC-%Pred-Pre: 103 %
FEV6-%Pred-Pre: 79 %
FEV6-Pre: 3.41 L
FEV6FVC-%Pred-Pre: 104 %
FVC-%Pred-Pre: 76 %
FVC-Pre: 3.48 L
Pre FEV1/FVC ratio: 76 %
Pre FEV6/FVC Ratio: 98 %

## 2021-06-23 NOTE — Progress Notes (Signed)
PFT done today. 

## 2021-07-02 ENCOUNTER — Encounter: Payer: Self-pay | Admitting: Gastroenterology

## 2021-07-02 ENCOUNTER — Encounter: Payer: Self-pay | Admitting: Certified Registered Nurse Anesthetist

## 2021-07-06 ENCOUNTER — Ambulatory Visit (INDEPENDENT_AMBULATORY_CARE_PROVIDER_SITE_OTHER)
Admission: RE | Admit: 2021-07-06 | Discharge: 2021-07-06 | Disposition: A | Discharge status: Home / Self Care | Payer: Medicare Other | Source: Ambulatory Visit | Attending: Internal Medicine | Admitting: Internal Medicine

## 2021-07-06 DIAGNOSIS — J84112 Idiopathic pulmonary fibrosis: Secondary | ICD-10-CM | POA: Diagnosis not present

## 2021-07-06 DIAGNOSIS — I2510 Atherosclerotic heart disease of native coronary artery without angina pectoris: Secondary | ICD-10-CM | POA: Diagnosis not present

## 2021-07-06 DIAGNOSIS — J432 Centrilobular emphysema: Secondary | ICD-10-CM | POA: Diagnosis not present

## 2021-07-06 DIAGNOSIS — S2242XA Multiple fractures of ribs, left side, initial encounter for closed fracture: Secondary | ICD-10-CM | POA: Diagnosis not present

## 2021-07-08 ENCOUNTER — Other Ambulatory Visit: Payer: Self-pay | Admitting: Medical

## 2021-07-08 ENCOUNTER — Ambulatory Visit (HOSPITAL_COMMUNITY): Payer: Medicare Other | Attending: Internal Medicine

## 2021-07-08 ENCOUNTER — Encounter: Payer: Self-pay | Admitting: Certified Registered Nurse Anesthetist

## 2021-07-08 DIAGNOSIS — J84112 Idiopathic pulmonary fibrosis: Secondary | ICD-10-CM

## 2021-07-08 DIAGNOSIS — R0609 Other forms of dyspnea: Secondary | ICD-10-CM | POA: Diagnosis not present

## 2021-07-08 LAB — ECHOCARDIOGRAM COMPLETE
Area-P 1/2: 3.36 cm2
S' Lateral: 3.4 cm

## 2021-07-09 ENCOUNTER — Ambulatory Visit (AMBULATORY_SURGERY_CENTER): Payer: Medicare Other | Admitting: Gastroenterology

## 2021-07-09 ENCOUNTER — Telehealth: Payer: Self-pay | Admitting: Internal Medicine

## 2021-07-09 ENCOUNTER — Encounter: Payer: Self-pay | Admitting: Gastroenterology

## 2021-07-09 VITALS — BP 88/44 | HR 82 | Temp 97.3°F | Resp 16 | Ht 71.0 in | Wt 187.0 lb

## 2021-07-09 DIAGNOSIS — R1011 Right upper quadrant pain: Secondary | ICD-10-CM | POA: Diagnosis not present

## 2021-07-09 DIAGNOSIS — D125 Benign neoplasm of sigmoid colon: Secondary | ICD-10-CM | POA: Diagnosis not present

## 2021-07-09 DIAGNOSIS — K219 Gastro-esophageal reflux disease without esophagitis: Secondary | ICD-10-CM

## 2021-07-09 DIAGNOSIS — Z800 Family history of malignant neoplasm of digestive organs: Secondary | ICD-10-CM

## 2021-07-09 DIAGNOSIS — K208 Other esophagitis without bleeding: Secondary | ICD-10-CM | POA: Diagnosis not present

## 2021-07-09 DIAGNOSIS — Z1211 Encounter for screening for malignant neoplasm of colon: Secondary | ICD-10-CM | POA: Diagnosis not present

## 2021-07-09 DIAGNOSIS — K319 Disease of stomach and duodenum, unspecified: Secondary | ICD-10-CM | POA: Diagnosis not present

## 2021-07-09 DIAGNOSIS — K3189 Other diseases of stomach and duodenum: Secondary | ICD-10-CM | POA: Diagnosis not present

## 2021-07-09 DIAGNOSIS — D122 Benign neoplasm of ascending colon: Secondary | ICD-10-CM | POA: Diagnosis not present

## 2021-07-09 DIAGNOSIS — K5909 Other constipation: Secondary | ICD-10-CM

## 2021-07-09 DIAGNOSIS — K2970 Gastritis, unspecified, without bleeding: Secondary | ICD-10-CM | POA: Diagnosis not present

## 2021-07-09 MED ORDER — SODIUM CHLORIDE 0.9 % IV SOLN: 500.0000 mL | Freq: Once | INTRAVENOUS | Status: DC

## 2021-07-09 MED ORDER — FAMOTIDINE 20 MG PO TABS: 20.0000 mg | ORAL_TABLET | Freq: Every day | ORAL | 3 refills | Status: DC

## 2021-07-09 MED ORDER — PANTOPRAZOLE SODIUM 40 MG PO TBEC: 40.0000 mg | DELAYED_RELEASE_TABLET | Freq: Every day | ORAL | 3 refills | Status: DC

## 2021-07-09 NOTE — Progress Notes (Signed)
1033 Ephedrine 15 mg given IV due to low BP, MD updated.   ?

## 2021-07-09 NOTE — Progress Notes (Signed)
Report given to PACU, vss 

## 2021-07-09 NOTE — Progress Notes (Signed)
1040 Ephedrine 15 mg given IV due to low BP, MD updated.   ?

## 2021-07-09 NOTE — Op Note (Signed)
[place] ?Patient Name: Robert Lang ?Procedure Date: 07/09/2021 10:10 AM ?MRN: [id number] ?Endoscopist: Jackquline Denmark , MD ?Age: 71 ?Referring MD:  ?Date of Birth: 01-Apr-1950 ?Gender: Male ?Account #: [id number] ?Procedure:                Upper GI endoscopy ?Indications:              Abdominal pain in the right upper quadrant. GERD ?Medicines:                Monitored Anesthesia Care ?Procedure:                Pre-Anesthesia Assessment: ?                          - Prior to the procedure, a History and Physical  ?                          was performed, and patient medications and  ?                          allergies were reviewed. The patient's tolerance of  ?                          previous anesthesia was also reviewed. The risks  ?                          and benefits of the procedure and the sedation  ?                          options and risks were discussed with the patient.  ?                          All questions were answered, and informed consent  ?                          was obtained. Prior Anticoagulants: The patient has  ?                          taken Xarelto (rivaroxaban), last dose was 1 day  ?                          prior to procedure. ASA Grade Assessment: III - A  ?                          patient with severe systemic disease. After  ?                          reviewing the risks and benefits, the patient was  ?                          deemed in satisfactory condition to undergo the  ?                          procedure. ?  After obtaining informed consent, the endoscope was  ?                          passed under direct vision. Throughout the  ?                          procedure, the patient's blood pressure, pulse, and  ?                          oxygen saturations were monitored continuously. The  ?                          GIF HQ190 #[id number] was introduced through the  ?                          mouth, and advanced to the second part of  duodenum.  ?                          The upper GI endoscopy was accomplished without  ?                          difficulty. The patient tolerated the procedure  ?                          well. ?Scope In: ?Scope Out: ?Findings:                 The Z-line was irregular and was found 43 cm from  ?                          the incisors. Biopsies were taken with a cold  ?                          forceps for histology. ?                          Diffuse moderate inflammation characterized by  ?                          erosions, erythema and friability was found in the  ?                          gastric antrum (d/d Early GAVE). Biopsies were  ?                          taken with a cold forceps for histology. ?                          Mildly scalloped mucosa was found in the second  ?                          portion of the duodenum. Biopsies for histology  ?                          were taken  with a cold forceps for evaluation of  ?                          celiac disease. ?Complications:            No immediate complications. ?Estimated Blood Loss:     Estimated blood loss: none. ?Impression:               - Z-line irregular, 43 cm from the incisors.  ?                          Biopsied. ?                          - Gastritis. Biopsied. ?                          - Minimally scalloped mucosa was found in the  ?                          duodenum. Biopsied. ?Recommendation:           - Patient has a contact number available for  ?                          emergencies. The signs and symptoms of potential  ?                          delayed complications were discussed with the  ?                          patient. Return to normal activities tomorrow.  ?                          Written discharge instructions were provided to the  ?                          patient. ?                          - Resume previous diet. ?                          - Continue present medications including Protonix  ?                           40 mg p.o. once a day. ?                          - Await pathology results. ?                          - The findings and recommendations were discussed  ?                          with the patient's family. ?                          -  If still with problems, would perform further  ?                          work-up. ?Jackquline Denmark, MD ?07/09/2021 10:56:37 AM ?This report has been signed electronically. ?

## 2021-07-09 NOTE — Telephone Encounter (Addendum)
? ? ?  Beth ? ?You are seeing him on 07/14/21. ? ?CT - ILD no change ? ?ECHO - there might be a vegetation and he needs to see cardiology ? ?[place] ? ? ? ?SIGNATURE  ? ? ?Dr. Brand Males, M.D., F.C.C.P,  ?Pulmonary and Critical Care Medicine ?Staff Physician, [facility] ?Center Director - Interstitial Lung Disease  Program  ?Medical Director - [facility] ?[facility] ?[place], [place], [address] ? ?NPI Number:  NPI #[id number] ?DEA Number: [id number] ? ?Pager: [phone], If no answer  -> Check AMION or Try [phone] ?Telephone (clinical office): [phone] ?Telephone (research): [phone] ? ?6:23 PM ?07/09/2021 ? ?

## 2021-07-09 NOTE — Progress Notes (Signed)
1030 Ephedrine 10 mg given IV due to low BP, MD updated.   

## 2021-07-09 NOTE — Progress Notes (Signed)
Called to room to assist during endoscopic procedure.  Patient ID and intended procedure confirmed with present staff. Received instructions for my participation in the procedure from the performing physician.  

## 2021-07-09 NOTE — Op Note (Signed)
[place] ?Patient Name: Robert Lang ?Procedure Date: 07/09/2021 10:09 AM ?MRN: [id number] ?Endoscopist: Jackquline Denmark , MD ?Age: 71 ?Referring MD:  ?Date of Birth: 1951-01-06 ?Gender: Male ?Account #: [id number] ?Procedure:                Colonoscopy ?Indications:              Screening in patient at increased risk: Colorectal  ?                          cancer in father 30 or older ?Medicines:                Monitored Anesthesia Care ?Procedure:                Pre-Anesthesia Assessment: ?                          - Prior to the procedure, a History and Physical  ?                          was performed, and patient medications and  ?                          allergies were reviewed. The patient's tolerance of  ?                          previous anesthesia was also reviewed. The risks  ?                          and benefits of the procedure and the sedation  ?                          options and risks were discussed with the patient.  ?                          All questions were answered, and informed consent  ?                          was obtained. Prior Anticoagulants: The patient has  ?                          taken Xarelto (rivaroxaban), last dose was 1 day  ?                          prior to procedure. ASA Grade Assessment: III - A  ?                          patient with severe systemic disease. After  ?                          reviewing the risks and benefits, the patient was  ?                          deemed in satisfactory condition to undergo the  ?  procedure. ?                          After obtaining informed consent, the colonoscope  ?                          was passed under direct vision. Throughout the  ?                          procedure, the patient's blood pressure, pulse, and  ?                          oxygen saturations were monitored continuously. The  ?                          Olympus [id number]-[id number] (#[id number]) Colonoscope was  ?                           introduced through the anus and advanced to the the  ?                          cecum, identified by appendiceal orifice and  ?                          ileocecal valve. The colonoscopy was performed  ?                          without difficulty. The patient tolerated the  ?                          procedure well. The quality of the bowel  ?                          preparation was adequate to identify polyps. The  ?                          ileocecal valve, appendiceal orifice, and rectum  ?                          were photographed. ?Scope In: 10:30:53 AM ?Scope Out: 10:51:50 AM ?Scope Withdrawal Time: 0 hours 18 minutes 1 second  ?Total Procedure Duration: 0 hours 20 minutes 57 seconds  ?Findings:                 A 8 mm polyp was found in the distal ascending  ?                          colon. The polyp was sessile. The polyp was removed  ?                          with a cold snare. Resection and retrieval were  ?                          complete. ?  Three sessile polyps were found in the proximal  ?                          sigmoid colon and mid sigmoid colon. The polyps  ?                          were 4 to 8 mm in size. These polyps were removed  ?                          with a cold snare. The larger polyp was removed  ?                          using a piecemeal technique. Minimal bleeding which  ?                          stopped on its own. Resection and retrieval were  ?                          complete. Few hyperplastic appearing small  ?                          diminutive polyps were visualized in the rectum and  ?                          distal sigmoid colon, confirmed by NBI as  ?                          hyperplastic (not removed) ?                          Multiple medium-mouthed diverticula were found in  ?                          the sigmoid colon. ?                          Non-bleeding internal hemorrhoids were found during  ?                           retroflexion. The hemorrhoids were small and Grade  ?                          I (internal hemorrhoids that do not prolapse). ?                          The exam was otherwise without abnormality on  ?                          direct and retroflexion views. ?Complications:            No immediate complications. ?Estimated Blood Loss:     Estimated blood loss: none. ?Impression:               - One 8 mm polyp in the distal ascending colon,  ?  removed with a cold snare. Resected and retrieved. ?                          - Three 4 to 6 mm polyps in the proximal sigmoid  ?                          colon and in the mid sigmoid colon, removed with a  ?                          cold snare. Resected and retrieved. ?                          - Diverticulosis in the sigmoid colon. ?                          - Non-bleeding internal hemorrhoids. ?                          - The examination was otherwise normal on direct  ?                          and retroflexion views. ?Recommendation:           - Patient has a contact number available for  ?                          emergencies. The signs and symptoms of potential  ?                          delayed complications were discussed with the  ?                          patient. Return to normal activities tomorrow.  ?                          Written discharge instructions were provided to the  ?                          patient. ?                          - Resume previous diet. ?                          - Continue present medications. ?                          - Await pathology results. ?                          - Repeat colonoscopy for surveillance based on  ?                          pathology results. ?                          -  Resume Xarelto (rivaroxaban) at prior dose 4/22 ?Jackquline Denmark, MD ?07/09/2021 11:02:12 AM ?This report has been signed electronically. ?

## 2021-07-09 NOTE — Progress Notes (Signed)
? ? ?Chief Complaint: Abdominal pain. ? ?Referring Provider:  Mackie Pai, [name]    ? ? ?ASSESSMENT AND PLAN;  ? ?#1. RUQ pain. Likely musculoskeletal or related to back pain. Neg [place], CT AP 04/2021, neg MRI liver 02/2021. Nl CBC, CMP, lipase ? ?#2. GERD ? ?#2. FH colon Ca (dad>60) ? ?#4. Chronic constipation ? ? ?Plan: ?- Protonix '40mg'$  po qAM #30, 6 refills ?- Continue pepcid '40mg'$  po QHS, 30, 6 refills ?- EGD/colon 2 day prep off Xarelto x 24hrs and after cardio clearence ?- Heating pads for R chest wall pain ?- Miralax 17g po bid/colace 1 tab po qd until large BM.  Then can titrate MiraLAX to QD ?- D/W pt and pt's wife in detail. ? ? ? ?I discussed EGD/Colonoscopy- the indications, risks, alternatives and potential complications including, but not limited to bleeding, infection, reaction to meds, damage to internal organs, cardiac and/or pulmonary problems, and perforation requiring surgery. The possibility that significant findings could be missed was explained. All ? were answered. Pt consents to proceed. ? ?HIGHER THAN BASELINE RISK.The nature of the procedure, as well as the risks, benefits, and alternatives were carefully and thoroughly reviewed with the patient. Ample time for discussion and questions allowed. The patient understood, was satisfied, and agreed to proceed.  ? ?HPI:   ? ?Robert Lang is a 71 y.o. male  ?hx of CAD s/p MI 01/10/2019 s/p PCI/DES to RCA, A Fib on Xeralto, H/O atrial flutter s/p ablation 2017, HLD, H/O pericarditis, OSA, HTN, Allergic Rhinitis, COPD, ILD, Lung Nodules (Dr Chase Caller), DJD, HLD ? ?RUQ pain x 2 months, moderate to severe after he lies down on bed. ?Continuous, at time sharp ?Nausea but no vomiting. ?Tender to touch. ?Not related to eating. ?Had chest x-ray to rule out rib fracture which was negative ?Neg [place] x 2, CT AP Feb 2023, MRI liver with contrast December 2022 ?Normal CBC, CMP, lipase ?Advised EGD to rule out gastric etiology. ? ?Also with constipation,  especially after taking Oxy for back pain.  He only uses it sparingly.  No melena or hematochezia.  No fever chills or night sweats.  No recent weight loss. ? ?No dysphagia. ? ? ? ?Past GI procedures: ? ?CT Abdo/pelvis with contrast 04/2021 ?Chronic changes as described above. No acute abnormality to ?correspond with the given clinical history is noted ? ?RUQ [place] 04/2021 ?-Stable hemangioma in the liver ?-Normal gallbladder ? ?Ultrasound complete 03/2021 ?1. No acute abnormality. ?2. 3 mm gallbladder polyp. No further follow-up required ? ?MRI liver with contrast 02/22/2021 ?1. Small benign hemangioma in the RIGHT hepatic lobe corresponds to ?indeterminate lesion on comparison CT. ?2. Normal biliary tree and pancreas. ? ?-Colonoscopy 09/2016 [facility].  Fair prep.  Sigmoid diverticulosis.  Repeat in 5 years (2023). FH colon cancer (dad >60) ? ?SH: Moved here recently, taking care of grandkids.  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 ?Past Medical History:  ?Diagnosis Date  ? Acute ST elevation myocardial infarction (STEMI) of inferolateral wall ([place]) 01/10/2019  ? 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 M75.00)  Formatting of this note might be different from the original. M75.00)  ? Allergic rhinitis due to pollen 09/03/2013  ? J30.1)  Formatting of this note might be different from the original. J30.1)  ? Allergy   ? Anticoagulated 07/01/2016  ? Anxiety   ? Arthritis   ? Atrial fibrillation ([place])   ? Atrial flutter ([place]) 06/26/2015  ? CAD (coronary artery disease)   ?  Chest pain 06/26/2015  ? COPD (chronic obstructive pulmonary disease) ([place])   ? Coronary artery disease 07/06/2018  ? Cardiac catheterization 2017 showing 90% small diagonal branch disease  ? DDD (degenerative disc disease), cervical 09/03/2013  ? M50.90)  Formatting of this note might be different from the original. M50.90)  ? Depression   ? Depression   ? Depressive disorder 09/03/2013  ? Dizziness 10/28/2017  ? Dyslipidemia, goal LDL  below 70 07/06/2018  ? Dyspnea on exertion 10/20/2018  ? Esophageal reflux 09/03/2013  ? Essential hypertension 07/06/2018  ? ETOH abuse 01/11/2019  ? 6 pack of beer per day  ? Falls 10/28/2017  ? GERD (gastroesophageal reflux disease)   ? H/O amiodarone therapy 07/01/2016  ? Heart attack ([place])   ? Heart palpitations 01/27/2017  ? History of colon polyps   ? Hyperlipidemia   ? Hypertension   ? IPF (idiopathic pulmonary fibrosis) ([place])   ? Kidney stones   ? Neck pain 09/03/2013  ? Neuropathy 07/06/2018  ? Obstructive sleep apnea 08/05/2015  ? PLMD (periodic limb movement disorder) 11/04/2015  ? 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 STORY: Due to alcohol/ tobacco  Formatting of this note might be different from the original. STORY: Due to alcohol/ tobacco  ? Pure hypercholesterolemia 09/03/2013  ? E78.0)  Formatting of this note might be different from the original. E78.0)  ? Screening for prostate cancer 09/03/2013  ? Smoker 01/11/2019  ? Smoking 07/06/2018  ? Status post ablation of atrial flutter 07/06/2018  ? 2017  ? ? ?Past Surgical History:  ?Procedure Laterality Date  ? ATRIAL FIBRILLATION ABLATION  10/2015  ? BACK SURGERY    ? CARDIOVERSION  2017  ? CATARACT EXTRACTION Bilateral 2017  ? March and April 2017  ? COLONOSCOPY  2018  ? CORONARY STENT PLACEMENT  2012  ? CORONARY/GRAFT ACUTE MI REVASCULARIZATION N/A 01/10/2019  ? Procedure: CORONARY/GRAFT ACUTE MI REVASCULARIZATION;  Surgeon: Leonie Man, MD;  Location: [facility];  Service: Cardiovascular;  Laterality: N/A;  ? INGUINAL HERNIA REPAIR    ? over 20 years ago  ? LAPAROSCOPIC APPENDECTOMY N/A 01/17/2021  ? Procedure: APPENDECTOMY LAPAROSCOPIC;  Surgeon: Felicie Morn, MD;  Location: [facility];  Service: General;  Laterality: N/A;  ? LEFT HEART CATH AND CORONARY ANGIOGRAPHY N/A 01/10/2019  ? Procedure: LEFT HEART CATH AND CORONARY ANGIOGRAPHY;  Surgeon: Leonie Man, MD;  Location: [facility];  Service: Cardiovascular;   Laterality: N/A;  ? LUMBAR LAMINECTOMY Bilateral 04/05/2016  ? L2-L5   ? VENTRAL HERNIA REPAIR  2018  ? ? ?Family History  ?Problem Relation Age of Onset  ? Colon cancer Father   ? Throat cancer Brother   ? ? ?Social History  ? ?Tobacco Use  ? Smoking status: Former  ?  Packs/day: 1.00  ?  Years: 30.00  ?  Pack years: 30.00  ?  Types: Cigarettes  ?  Quit date: 01/10/2019  ?  Years since quitting: 2.4  ? Smokeless tobacco: Former  ?Vaping Use  ? Vaping Use: Never used  ?Substance Use Topics  ? Alcohol use: Yes  ?  Comment: 8oz of wine a day  ? Drug use: Never  ? ? ?Current Outpatient Medications  ?Medication Sig Dispense Refill  ? acetaminophen (TYLENOL) 325 MG tablet Take 2 tablets (650 mg total) by mouth every 6 (six) hours as needed.    ? atorvastatin (LIPITOR) 80 MG tablet Take 1 tablet (80 mg total) by mouth at bedtime.  90 tablet 3  ? buPROPion (WELLBUTRIN XL) 300 MG 24 hr tablet Take 1 tablet (300 mg total) by mouth daily. 90 tablet 0  ? busPIRone (BUSPAR) 15 MG tablet Take 15 mg by mouth 2 (two) times daily.    ? Cholecalciferol 25 MCG (1000 UT) tablet Take 1,000 Units by mouth daily.     ? docusate sodium (COLACE) 100 MG capsule Take 100 mg by mouth at bedtime.    ? enalapril (VASOTEC) 2.5 MG tablet TAKE 1 TABLET(2.5 MG) BY MOUTH DAILY 90 tablet 3  ? famotidine (PEPCID) 40 MG tablet Take 1 tablet (40 mg total) by mouth at bedtime. 30 tablet 6  ? fexofenadine (ALLEGRA) 180 MG tablet Take 180 mg by mouth at bedtime.     ? finasteride (PROSCAR) 5 MG tablet Take 1 tablet (5 mg total) by mouth daily. 90 tablet 1  ? gabapentin (NEURONTIN) 100 MG capsule TAKE 4 CAPSULES BY MOUTH AT BEDTIME 120 capsule 3  ? Ginger, Zingiber officinalis, (GINGER ROOT) 550 MG CAPS Take 550 mg by mouth daily.    ? melatonin 5 MG TABS Take 10 mg by mouth at bedtime.    ? Multiple Vitamins-Minerals (PRESERVISION AREDS PO) Take 1 capsule by mouth in the morning and at bedtime.    ? ondansetron (ZOFRAN) 4 MG tablet TAKE 1 TABLET(4 MG) BY  MOUTH EVERY 8 HOURS AS NEEDED FOR NAUSEA OR VOMITING 20 tablet 0  ? pantoprazole (PROTONIX) 40 MG tablet Take 1 tablet (40 mg total) by mouth daily. 30 tablet 6  ? Pirfenidone (ESBRIET) 801 MG TABS Take 801 mg by mou

## 2021-07-09 NOTE — Patient Instructions (Signed)
YOU HAD AN ENDOSCOPIC PROCEDURE TODAY AT [facility]:   Refer to the procedure report that was given to you for any specific questions about what was found during the examination.  If the procedure report does not answer your questions, please call your gastroenterologist to clarify.  If you requested that your care partner not be given the details of your procedure findings, then the procedure report has been included in a sealed envelope for you to review at your convenience later. ? ?**Handouts given on polyps, diverticulosis and hemorrhoids** ? ?YOU SHOULD EXPECT: Some feelings of bloating in the abdomen. Passage of more gas than usual.  Walking can help get rid of the air that was put into your GI tract during the procedure and reduce the bloating. If you had a lower endoscopy (such as a colonoscopy or flexible sigmoidoscopy) you may notice spotting of blood in your stool or on the toilet paper. If you underwent a bowel prep for your procedure, you may not have a normal bowel movement for a few days. ? ?Please Note:  You might notice some irritation and congestion in your nose or some drainage.  This is from the oxygen used during your procedure.  There is no need for concern and it should clear up in a day or so. ? ?SYMPTOMS TO REPORT IMMEDIATELY: ? ?Following lower endoscopy (colonoscopy or flexible sigmoidoscopy): ? Excessive amounts of blood in the stool ? Significant tenderness or worsening of abdominal pains ? Swelling of the abdomen that is new, acute ? Fever of 100?F or higher ? ?Following upper endoscopy (EGD) ? Vomiting of blood or coffee ground material ? New chest pain or pain under the shoulder blades ? Painful or persistently difficult swallowing ? New shortness of breath ? Fever of 100?F or higher ? Black, tarry-looking stools ? ?For urgent or emergent issues, a gastroenterologist can be reached at any hour by calling [phone]. ?Do not use MyChart messaging for urgent  concerns.  ? ? ?DIET:  We do recommend a small meal at first, but then you may proceed to your regular diet.  Drink plenty of fluids but you should avoid alcoholic beverages for 24 hours. ? ?ACTIVITY:  You should plan to take it easy for the rest of today and you should NOT DRIVE or use heavy machinery until tomorrow (because of the sedation medicines used during the test).   ? ?FOLLOW UP: ?Our staff will call the number listed on your records 48-72 hours following your procedure to check on you and address any questions or concerns that you may have regarding the information given to you following your procedure. If we do not reach you, we will leave a message.  We will attempt to reach you two times.  During this call, we will ask if you have developed any symptoms of COVID 19. If you develop any symptoms (ie: fever, flu-like symptoms, shortness of breath, cough etc.) before then, please call [phone].  If you test positive for Covid 19 in the 2 weeks post procedure, please call and report this information to [place].   ? ?If any biopsies were taken you will be contacted by phone or by letter within the next 1-3 weeks.  Please call us at [phone] if you have not heard about the biopsies in 3 weeks.  ? ? ?SIGNATURES/CONFIDENTIALITY: ?You and/or your care partner have signed paperwork which will be entered into your electronic medical record.  These signatures attest to the fact  that that the information above on your After Visit Summary has been reviewed and is understood.  Full responsibility of the confidentiality of this discharge information lies with you and/or your care-partner.  ?

## 2021-07-09 NOTE — Progress Notes (Signed)
VS by DT  Pt's states no medical or surgical changes since previsit or office visit.  

## 2021-07-09 NOTE — Progress Notes (Signed)
1010 Robinul 0.1 mg IV given due large amount of secretions upon assessment.  MD made aware, vss 

## 2021-07-09 NOTE — Progress Notes (Signed)
1023 Ephedrine 10 mg given IV due to low BP, MD updated.   ?

## 2021-07-10 ENCOUNTER — Telehealth: Payer: Self-pay | Admitting: Gastroenterology

## 2021-07-10 ENCOUNTER — Telehealth: Payer: Self-pay | Admitting: *Deleted

## 2021-07-10 NOTE — Telephone Encounter (Signed)
Inbound call from patient wife. Had procedure yesterday 4/20. Reports patient have been taking famotidine '40mg'$  but the prescription sent in was for '20mg'$ . Would like to know should he take '20mg'$  or '40mg'$  ?

## 2021-07-10 NOTE — Telephone Encounter (Signed)
Spoke with the patient and told him that Dr. Lyndel Safe said he would be fine taking the Famotidine '20mg'$  QHS. ?

## 2021-07-10 NOTE — Telephone Encounter (Signed)
Returned pts call and spoke with his wife.  Told her that per Dr. Lyndel Safe pt should take 20 mg Famotadine at bedtime.   ?

## 2021-07-10 NOTE — Telephone Encounter (Signed)
Spoke with the patient regarding her questions about the patient's Famotidine dose. Messaged Dr. Lyndel Safe. ?

## 2021-07-10 NOTE — Telephone Encounter (Signed)
I think she should be fine with famotidine 20 mg QHS ?RG ?

## 2021-07-13 ENCOUNTER — Telehealth: Payer: Self-pay | Admitting: *Deleted

## 2021-07-13 NOTE — Telephone Encounter (Signed)
First attempt, left VM.  

## 2021-07-13 NOTE — Telephone Encounter (Signed)
Patient wife returned call. Reports patient is doing just fine ?

## 2021-07-13 NOTE — Telephone Encounter (Signed)
Second follow up call attempt.  LVM ?

## 2021-07-14 ENCOUNTER — Encounter: Payer: Self-pay | Admitting: Primary Care

## 2021-07-14 ENCOUNTER — Ambulatory Visit: Payer: Medicare Other | Admitting: Primary Care

## 2021-07-14 DIAGNOSIS — G4733 Obstructive sleep apnea (adult) (pediatric): Secondary | ICD-10-CM | POA: Diagnosis not present

## 2021-07-14 DIAGNOSIS — I330 Acute and subacute infective endocarditis: Secondary | ICD-10-CM | POA: Diagnosis not present

## 2021-07-14 DIAGNOSIS — J84112 Idiopathic pulmonary fibrosis: Secondary | ICD-10-CM

## 2021-07-14 NOTE — Patient Instructions (Addendum)
HRCT showed stable ILD (pulmonary fibrosis) compared to 05/08/20 ? ?ECHO - there might be a vegetation and he needs to see cardiology (We will send a note to Robert Lang but please call and schedule visit with their office) ? ? ?Recommendations: ?Continue Esbriet 1 tab three times a day with food ?Hold off on restarting Tumeric  ?Encourage consistent daily exercise, let me know if you want to return to pulm rehab  ?Continue to wear CPAP at bedtime ?If you notice O2 sustaining <88% notify office  ?Contact Robert Lang - [email or web address] for support group if interested  ? ?Follow-up: ?6-8 weeks with Robert Lang  (30 min ILD slot) ?

## 2021-07-14 NOTE — Assessment & Plan Note (Signed)
-   HST 06/10/20 showed severe OSA, AHI 31/hr ?- Patient is 100% compliant with CPAP use ?- Pressure 5-20cm h20; Residual AHI 1.3/hr  ?- No changes today ?

## 2021-07-14 NOTE — Assessment & Plan Note (Addendum)
-   ILD is stable on HRCT imaging from April 2023. Dyspnea symptoms have been more pronounced lately, could be from deconditioning. Recommend regular exercise regimen and would like him to consider returning to pulm rehab. Referring back to cardiology to evaluate possible vegetation seen on echocardiogram. CT chest imaging showed mild emphysema, may want to trial  patient on scheduled bronchodilator treatment in the future. He is not interested in research opportunities for ILD treatment. Continue Esbriet '801mg'$  TID with food. GI symptoms are tolerable. He will contact support group. FU in 6-8 weeks.  ?

## 2021-07-14 NOTE — Telephone Encounter (Signed)
Dr. Agustin Cree this is a patient of yours I wanted to forward results on, concern raised for possible vegetation on recent echocardiogram. I believe they will be reaching out to your office to scheduled a visit. Thanks  ?

## 2021-07-14 NOTE — Assessment & Plan Note (Signed)
-   Concern for vegetation of heart valve on echocardiogram, referring back to cardiology for evaluation ?

## 2021-07-14 NOTE — Progress Notes (Signed)
? ?'@Patient'$  ID: Robert Lang, male    DOB: 01-02-1951, 71 y.o.   MRN: [id number] ? ?Chief Complaint  ?Patient presents with  ? Follow-up  ?  F/u after echo and HRCT.  ? ? ?Referring provider: ?[name], Percell Miller, [name] ? ?HPI: ?71 year old male, former smoker. PMH significant for CAD, afib, HTN, COPD, allergic rhinitis, OSA, GERD. Patient of Dr. Chase Caller.  ? ?Previous [facility] pulmonary encounter:  ?06/23/2021 -   ?Chief Complaint  ?Patient presents with  ? Follow-up  ?  Follow up for IPH. Pt states he cant walk to far due to breathing. Pt did get full PFTs done this office visit.   ? 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 The diagnosis based on the fact that you age over 72, previous smoking, you occupational exposures, negative serology, Caucasian ethnicity, male gender and probable UIP pattern on CT scan ?  ?Started esbriet - one week after easter 2022 ?  ?Weight loss after starting Esbriet.  2 antihypertensives had to be stopped ?  ?OSA CPAP pending end of 2022 ?  ?Mild gait unsteadiness due to spinal issues   ? ?Long-term turmeric use ? ?Lung nodules - feb 2022:L Three scattered solid basilar left lower lobe pulmonary nodules, largest 7 mm posteromedially.  ? ? ?HPI ?Robert Lang 71 y.o. -'= returns for follow-up.  He presents with his wife.  Overall no change in shortness of breath.  He continues to tolerate pirfenidone well.  He tells me that for the last 3-4 months he has developed right upper quadrant pain.  Initially primary care physician thought this was because of acid reflux and gave him Carafate but this pain persist.  It is a dull pain that in the right upper quadrant it comes and goes.  Gets worse with eating.  It improves by not eating.  Overall course is 1 of improvement but it is still there.  It was severe but now it is mild-moderate.  He says GI has seen him and his gallbladder and pancreas are being cleared.  Upper endoscopy is pending.  Apparently the working diagnosis is that  because of neuropathy.  However he does indicate that ever since he has been taking his.  He has been taking ginger to prevent any nausea.  After this pain started maybe he has intermittent extra nausea for which he takes Zofran 2 times a week.  He does not think aspirate is the cause of this pain and the increased nausea. ? ?His pulmonary function test shows stability in FVC but his DLCO is down 18%.?  We do not know if this is because of technical performance. ? ?His last pulmonary function test was a year ago.  He did have some small nodules at that time. ? ?His wife is interested in the support group.  I did indicate to them the support group is currently dormant but did give the email of the contact person there.  He is also interested in clinical trials.  I did indicate to him that we would give a consent for research study currently underway.  However would like to wait till there is clarity on the abdominal symptoms especially right upper quadrant.  He is okay with that. ? ? ? ?07/14/2021- Interim hx  ?Patient presents today to review sleep study results and HRCT imaging. He is doing alright today. Accompanied by his wife. Dyspnea symptoms were noted to be similar to previous when he saw Dr. Chase Caller earlier this month. PFTs were inconclusive  for worsening disease, HRCT showed unchanged appear appearance ILD since 05/08/2020. Mild centrilobular emphysema. He had an endocardiogram that showed possible vegetation, advised to follow up with cardiology- he last saw Dr. Agustin Cree in December 2022. He is currently taking Esbriet '801mg'$  three times a day with food. Stomach is better. He gets out of breath with simple tasks, he can no longer do house work or shopping. He uses an Transport planner which has been helpful. He has a rare cough, mostly just to clear his throat. Nausea is tolerable, no diarrhea. He is taking medication for anxiety and depression. He is not particularly active, he has been to pulmonary rehab  but did not keep up with exercises. He is not particularly interested in returning but will consider restarting an exercise regimen and if needed will return to pul, rehab. He is not interested in any research opportunities. His wife states that they will reach out to contact for patient support group. He is consistent with CPAP use, no issues with pressure setting or mask fit.  ? ?Airview download 06/14/21-07/13/21 ?Usage 30/30 days used; 100% > 4 hours ?Average usage 6 hours 51 mins ?Pressure 5-20cm h20 (14.2cm h20-95%) ?Airleaks 22.6L/min ?AHI 1.3 ? ? ?SYMPTOM SCALE - ILD 06/17/2020 ?  07/28/2020 ?  09/09/2020 ?184# 11/18/2020 ?185# ? ?Esbriet '801mg'$  BID 12/19/2020 ? ? ?Esbriet '801mg'$  TID ? 02/19/2021 ?187# 06/23/2021 ?180# 07/14/2021 ?Esbriet '801mg'$  TID   ?O2 use ra RA     RA   ra0 RA   ?Shortness of Breath 0 -> 5 scale with 5 being worst (score 6 If unable to do) 0-5          ?At rest 3 0.'5 2 4 3  '$ 0 3  ?Simple tasks - showers, clothes change, eating, shaving '5 3 3 3 2  3 3  '$ ?Household (dishes, doing bed, laundry) 5 3 (he does not do these tasks) 5 0 3 (he does not do these tasks)  0 NA  ?Shopping 5 3 (he does not do this often) '3 1 3 '$ (he does not do these tasks)  4 3.5 (uses scooter)  ?Walking level at own pace '4 3 3 1 3  5 3  '$ ?Walking up Stairs '5 5 4 1 3  5 '$ 3.5  ?Total (30-36) Dyspnea Score 27 17.'5 20 10 17  17 16  '$ ?How bad is your cough? '3 2 1 '$ fair 1  0 1  ?How bad is your fatigue '4 2 4 '$ fair '3  3 4  '$ ?How bad is nausea 0 0 3 good '2  3 2  '$ ?How bad is vomiting? ?  0 0 0 good 0  0 0  ?How bad is diarrhea? 3 0 0 good 0  0 0  ?How bad is anxiety? '5 5 3 '$ fair '2  5 5  '$ ?How bad is depression '5 5 3 '$ faor '3  3 4  '$ ?  ? ? ? ?Allergies  ?Allergen Reactions  ? Naproxen Other (See Comments)  ?  (Naprosyn *ANALGESICS - ANTI-INFLAMMATORY*) Nausea, Abdominal pain  ? ? ?Immunization History  ?Administered Date(s) Administered  ? Influenza Split 01/09/2010, 02/09/2011  ? Influenza, High Dose Seasonal PF 11/15/2018, 01/11/2020, 12/27/2020  ?  Influenza,inj,Quad PF,6+ Mos 01/01/2015  ? Influenza-Unspecified 01/30/2014, 02/02/2016, 12/15/2020  ? PFIZER(Purple Top)SARS-COV-2 Vaccination 04/27/2019, 05/18/2019, 01/14/2020, 07/22/2020, 12/15/2020  ? Pneumococcal Conjugate-13 11/23/2018  ? Pneumococcal Polysaccharide-23 08/12/2020  ? Tdap 02/18/2011  ? ? ?Past Medical History:  ?Diagnosis Date  ? Acute ST elevation myocardial infarction (  STEMI) of inferolateral wall ([place]) 01/10/2019  ? 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 M75.00)  Formatting of this note might be different from the original. M75.00)  ? Allergic rhinitis due to pollen 09/03/2013  ? J30.1)  Formatting of this note might be different from the original. J30.1)  ? Allergy   ? Anticoagulated 07/01/2016  ? Anxiety   ? Arthritis   ? Atrial fibrillation ([place])   ? Atrial flutter ([place]) 06/26/2015  ? CAD (coronary artery disease)   ? Chest pain 06/26/2015  ? COPD (chronic obstructive pulmonary disease) ([place])   ? Coronary artery disease 07/06/2018  ? Cardiac catheterization 2017 showing 90% small diagonal branch disease  ? DDD (degenerative disc disease), cervical 09/03/2013  ? M50.90)  Formatting of this note might be different from the original. M50.90)  ? Depression   ? Depression   ? Depressive disorder 09/03/2013  ? Dizziness 10/28/2017  ? Dyslipidemia, goal LDL below 70 07/06/2018  ? Dyspnea on exertion 10/20/2018  ? Esophageal reflux 09/03/2013  ? Essential hypertension 07/06/2018  ? ETOH abuse 01/11/2019  ? 6 pack of beer per day  ? Falls 10/28/2017  ? GERD (gastroesophageal reflux disease)   ? H/O amiodarone therapy 07/01/2016  ? Heart attack ([place])   ? Heart palpitations 01/27/2017  ? History of colon polyps   ? Hyperlipidemia   ? Hypertension   ? IPF (idiopathic pulmonary fibrosis) ([place])   ? Kidney stones   ? Neck pain 09/03/2013  ? Neuropathy 07/06/2018  ? Obstructive sleep apnea 08/05/2015  ? PLMD (periodic limb movement disorder) 11/04/2015  ? 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  STORY: Due to alcohol/ tobacco  Formatting of this note might be different from the original. STORY: Due to alcohol/ tobacco  ? Pure hypercholesterolemia 09/03/2013  ? E78.0)  Formatting of this note might be di

## 2021-07-15 DIAGNOSIS — F331 Major depressive disorder, recurrent, moderate: Secondary | ICD-10-CM | POA: Diagnosis not present

## 2021-07-15 DIAGNOSIS — G4733 Obstructive sleep apnea (adult) (pediatric): Secondary | ICD-10-CM | POA: Diagnosis not present

## 2021-07-15 DIAGNOSIS — F4322 Adjustment disorder with anxiety: Secondary | ICD-10-CM | POA: Diagnosis not present

## 2021-07-16 NOTE — Telephone Encounter (Signed)
Appt schedule

## 2021-07-27 ENCOUNTER — Ambulatory Visit: Payer: Medicare Other | Admitting: Cardiology

## 2021-07-28 ENCOUNTER — Ambulatory Visit: Payer: Medicare Other | Admitting: Cardiology

## 2021-07-28 ENCOUNTER — Encounter: Payer: Self-pay | Admitting: Cardiology

## 2021-07-28 VITALS — BP 90/56 | HR 90 | Ht 71.0 in | Wt 191.8 lb

## 2021-07-28 DIAGNOSIS — I10 Essential (primary) hypertension: Secondary | ICD-10-CM

## 2021-07-28 DIAGNOSIS — I2510 Atherosclerotic heart disease of native coronary artery without angina pectoris: Secondary | ICD-10-CM

## 2021-07-28 DIAGNOSIS — R0989 Other specified symptoms and signs involving the circulatory and respiratory systems: Secondary | ICD-10-CM

## 2021-07-28 DIAGNOSIS — E785 Hyperlipidemia, unspecified: Secondary | ICD-10-CM

## 2021-07-28 DIAGNOSIS — I483 Typical atrial flutter: Secondary | ICD-10-CM

## 2021-07-28 NOTE — Addendum Note (Signed)
Addended by: Jacobo Forest D on: 07/28/2021 10:31 AM ? ? Modules accepted: Orders ? ?

## 2021-07-28 NOTE — Progress Notes (Signed)
Cardiology Office Note:    Date:  07/28/2021   ID:  Robert Lang, DOB 12-Jun-1950, MRN [id number]  PCP:  [name], [name]  Cardiologist:  Gypsy Balsam, MD    Referring MD: [name], [place]   Chief Complaint  Patient presents with   Results         History of Present Illness:    Robert Lang is a 71 y.o. male    with a hx of CAD s/p PCI to LAD/RCA, atrial flutter s/p ablation 2017, HLD, HTN last seen while hospitalized.   LHC at [facility] 2017 with patent stent in prox LAD, 90% occlusion in small first diagonal artery and 25% occlusion of RCA w/ good EF 60%. Atrial flutter ablation at [facility] in 2017. Stress test 10/30/18 with no perfusion defect described as low risk study.   Presented to [facility] ED 01/10/19 with chest pain, code STEMI called, transferred to [facility]. Troponin peaked at [phone]. Echo preserved LV function. Underwent cardiac cath 01/10/19 with PCI and DES to RCA. Recommended for triple therapy for 1 month then stop aspirin. 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 His diltiazem was switched to coreg while hospitalized. He is in my office today to discuss results of his echocardiogram.  Echocardiogram surprisingly shows some potentially old vegetation of the aortic valve.  However looks old.  On top of that valve is competent.  There is no aortic insufficiency there is no aortic stenosis.  There is also some calcification of the valve.  So I brought him to the office to discuss this.  He does not have any signs and symptoms of endocarditis there is no fever no chills he is majority of symptomatology come from IPF.  He is complaining of having shortness of breath. 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 M75.00)   Allergic rhinitis due to pollen 09/03/2013   J30.1)  Formatting of this note might be different from the original. J30.1)   Allergy    Anticoagulated 07/01/2016   Anxiety    Arthritis    Atrial fibrillation (HCC)    Atrial flutter (HCC) 06/26/2015   CAD (coronary artery disease)    Chest pain 06/26/2015   COPD (chronic obstructive pulmonary disease) (HCC)    Coronary artery disease 07/06/2018   Cardiac catheterization 2017 showing 90% small diagonal branch disease   DDD (degenerative disc disease), cervical 09/03/2013   M50.90)  Formatting of this note might be different from the original. M50.90)   Depression    Depression    Depressive disorder 09/03/2013   Dizziness 10/28/2017   Dyslipidemia, goal LDL below 70 07/06/2018   Dyspnea on exertion 10/20/2018   Esophageal reflux 09/03/2013   Essential hypertension 07/06/2018   ETOH abuse 01/11/2019   6 pack of beer per day   Falls 10/28/2017   GERD (gastroesophageal reflux disease)    H/O amiodarone therapy 07/01/2016   Heart attack (HCC)    Heart palpitations 01/27/2017   History of colon polyps    Hyperlipidemia    Hypertension    IPF (idiopathic pulmonary fibrosis) (HCC)    Kidney stones    Neck pain 09/03/2013   Neuropathy 07/06/2018   Obstructive sleep apnea 08/05/2015   PLMD (  periodic limb movement disorder) 11/04/2015   Polycythemia, 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 STORY: Due to alcohol/ tobacco   Pure hypercholesterolemia 09/03/2013   E78.0)  Formatting of this note might be different from the original. E78.0)   Screening for prostate cancer 09/03/2013   Smoker 01/11/2019   Smoking 07/06/2018   Status post ablation of atrial flutter 07/06/2018   2017    Past Surgical History:  Procedure Laterality Date   ATRIAL FIBRILLATION ABLATION  10/2015   BACK  SURGERY     CARDIOVERSION  2017   CATARACT EXTRACTION Bilateral 2017   March and April 2017   COLONOSCOPY  2018   CORONARY STENT PLACEMENT  2012   CORONARY/GRAFT ACUTE MI REVASCULARIZATION N/A 01/10/2019   Procedure: CORONARY/GRAFT ACUTE MI REVASCULARIZATION;  Surgeon: Marykay Lex, MD;  Location: [facility] INVASIVE CV LAB;  Service: Cardiovascular;  Laterality: N/A;   INGUINAL HERNIA REPAIR     over 20 years ago   LAPAROSCOPIC APPENDECTOMY N/A 01/17/2021   Procedure: APPENDECTOMY LAPAROSCOPIC;  Surgeon: Quentin Ore, MD;  Location: [facility] OR;  Service: General;  Laterality: N/A;   LEFT HEART CATH AND CORONARY ANGIOGRAPHY N/A 01/10/2019   Procedure: LEFT HEART CATH AND CORONARY ANGIOGRAPHY;  Surgeon: Marykay Lex, MD;  Location: [facility] INVASIVE CV LAB;  Service: Cardiovascular;  Laterality: N/A;   LUMBAR LAMINECTOMY Bilateral 04/05/2016   L2-L5    VENTRAL HERNIA REPAIR  2018    Current Medications: Current Meds  Medication Sig   acetaminophen (TYLENOL) 325 MG tablet Take 2 tablets (650 mg total) by mouth every 6 (six) hours as needed. (Patient taking differently: Take 650 mg by mouth every 6 (six) hours as needed for mild pain or moderate pain.)   atorvastatin (LIPITOR) 80 MG tablet Take 1 tablet (80 mg total) by mouth at bedtime.   buPROPion (WELLBUTRIN XL) 300 MG 24 hr tablet Take 1 tablet (300 mg total) by mouth daily.   busPIRone (BUSPAR) 15 MG tablet Take 15 mg by mouth 2 (two) times daily.   Cholecalciferol 25 MCG (1000 UT) tablet Take 1,000 Units by mouth daily.    docusate sodium (COLACE) 100 MG capsule Take 100 mg by mouth at bedtime.   enalapril (VASOTEC) 2.5 MG tablet TAKE 1 TABLET(2.5 MG) BY MOUTH DAILY (Patient taking differently: Take 2.5 mg by mouth daily.)   famotidine (PEPCID) 20 MG tablet Take 1 tablet (20 mg total) by mouth at bedtime.   fexofenadine (ALLEGRA) 180 MG tablet Take 180 mg by mouth at bedtime.    finasteride (PROSCAR) 5 MG tablet Take 1 tablet (5 mg  total) by mouth daily.   fluticasone (FLONASE) 50 MCG/ACT nasal spray Place 1 spray into both nostrils daily as needed for allergies or rhinitis.   gabapentin (NEURONTIN) 100 MG capsule TAKE 4 CAPSULES BY MOUTH AT BEDTIME (Patient taking differently: Take 100 mg by mouth at bedtime. TAKE 4 CAPSULES BY MOUTH AT BEDTIME)   Ginger, Zingiber officinalis, (GINGER ROOT) 550 MG CAPS Take 550 mg by mouth daily.   melatonin 5 MG TABS Take 10 mg by mouth at bedtime.   Multiple Vitamins-Minerals (PRESERVISION AREDS PO) Take 1 capsule by mouth in the morning and at bedtime.   nitroGLYCERIN (NITROSTAT) 0.4 MG SL tablet Place 1 tablet (0.4 mg total) under the tongue every 5 (five) minutes as needed for chest pain.   ondansetron (ZOFRAN) 4 MG tablet TAKE 1 TABLET(4  MG) BY MOUTH EVERY 8 HOURS AS NEEDED FOR NAUSEA OR VOMITING (Patient taking differently: Take 4 mg by mouth every 8 (eight) hours as needed for vomiting or nausea.)   pantoprazole (PROTONIX) 40 MG tablet Take 1 tablet (40 mg total) by mouth daily.   Pirfenidone (ESBRIET) 801 MG TABS Take 801 mg by mouth 3 (three) times daily.   Polyethylene Glycol 3350 (MIRALAX PO) Take 17 g by mouth at bedtime.   ranolazine (RANEXA) 1000 MG SR tablet Take 1 tablet (1,000 mg total) by mouth 2 (two) times daily.   rivaroxaban (XARELTO) 20 MG TABS tablet TAKE 1 TABLET(20 MG) BY MOUTH DAILY WITH SUPPER (Patient taking differently: Take 20 mg by mouth daily with supper.)   sertraline (ZOLOFT) 100 MG tablet Take 100 mg by mouth at bedtime.   Sodium Sulfate-Mag Sulfate-KCl (SUTAB) [phone] MG TABS Take 1 kit by mouth as directed.   solifenacin (VESICARE) 5 MG tablet Take 5 mg by mouth at bedtime.   sucralfate (CARAFATE) 1 g tablet Take 1 tablet (1 g total) by mouth 4 (four) times daily -  with meals and at bedtime.   traZODone (DESYREL) 50 MG tablet Take 0.5-1 tablets (25-50 mg total) by mouth at bedtime as needed for sleep.   vitamin B-12 (CYANOCOBALAMIN) 1000 MCG  tablet Take 1,000 mcg by mouth daily.   [DISCONTINUED] Turmeric 500 MG CAPS Take 1 capsule by mouth 2 (two) times daily.     Allergies:   Naproxen   Social History   Socioeconomic History   Marital status: Married    Spouse name: Not on file   Number of children: Not on file   Years of education: Not on file   Highest education level: Not on file  Occupational History   Not on file  Tobacco Use   Smoking status: Former    Packs/day: 1.00    Years: 30.00    Pack years: 30.00    Types: Cigarettes    Quit date: 01/10/2019    Years since quitting: 2.5   Smokeless tobacco: Former  Building services engineer Use: Never used  Substance and Sexual Activity   Alcohol use: Yes    Comment: 8oz of wine a day   Drug use: Never   Sexual activity: Not on file  Other Topics Concern   Not on file  Social History Narrative   Not on file   Social Determinants of Health   Financial Resource Strain: Low Risk    Difficulty of Paying Living Expenses: Not hard at all  Food Insecurity: No Food Insecurity   Worried About Programme researcher, broadcasting/film/video in the Last Year: Never true   Ran Out of Food in the Last Year: Never true  Transportation Needs: No Transportation Needs   Lack of Transportation (Medical): No   Lack of Transportation (Non-Medical): No  Physical Activity: Inactive   Days of Exercise per Week: 0 days   Minutes of Exercise per Session: 0 min  Stress: No Stress Concern Present   Feeling of Stress : Not at all  Social Connections: Moderately Integrated   Frequency of Communication with Friends and Family: More than three times a week   Frequency of Social Gatherings with Friends and Family: More than three times a week   Attends Religious Services: More than 4 times per year   Active Member of [employer] or Organizations: No   Attends Banker Meetings: Never   Marital Status: Married     Family  History: The patient's family history includes Colon cancer in his father; Throat  cancer in his brother. ROS:   Please see the history of present illness.    All 14 point review of systems negative except as described per history of present illness  EKGs/Labs/Other Studies Reviewed:      Recent Labs: 01/16/2021: Magnesium 2.0; TSH 1.232 05/18/2021: ALT 16; BUN 11; Creatinine, Ser 0.92; Hemoglobin 15.1; Platelets 239.0; Potassium 4.6; Sodium 135  Recent Lipid Panel    Component Value Date/Time   CHOL 138 10/13/2020 0923   CHOL 128 04/03/2020 0951   TRIG 380.0 (H) 10/13/2020 0923   HDL 28.60 (L) 10/13/2020 0923   HDL 40 04/03/2020 0951   CHOLHDL 5 10/13/2020 0923   VLDL 76.0 (H) 10/13/2020 0923   LDLCALC 58 04/03/2020 0951   LDLDIRECT 66.0 10/13/2020 0923    Physical Exam:    VS:  BP (!) 90/56 (BP Location: Left Arm, Patient Position: Sitting)   Pulse 90   Ht 5\' 11"  (1.803 m)   Wt 191 lb 12.8 oz (87 kg)   SpO2 92%   BMI 26.75 kg/m     Wt Readings from Last 3 Encounters:  07/28/21 191 lb 12.8 oz (87 kg)  07/14/21 189 lb 3.2 oz (85.8 kg)  07/09/21 187 lb (84.8 kg)     GEN:  Well nourished, well developed in no acute distress HEENT: Normal NECK: No JVD; No carotid bruits LYMPHATICS: No lymphadenopathy CARDIAC: RRR, no murmurs, no rubs, no gallops RESPIRATORY:  Clear to auscultation without rales, wheezing or rhonchi  ABDOMEN: Soft, non-tender, non-distended MUSCULOSKELETAL:  No edema; No deformity  SKIN: Warm and dry LOWER EXTREMITIES: no swelling NEUROLOGIC:  Alert and oriented x 3 PSYCHIATRIC:  Normal affect   ASSESSMENT:    1. Coronary artery disease involving native coronary artery of native heart without angina pectoris   2. Essential hypertension   3. Typical atrial flutter (HCC)   4. Dyslipidemia, goal LDL below 70    PLAN:    In order of problems listed above:  Abnormal aortic valve with some question about endocarditis probably old if at all.  There is no evidence of endocarditis on the physical exam.  I suspect his pounds his  eyes, he does not have any fever does not have any chills.  I will check his CBC platelets today also will check inflammatory markers.  I think if those markers normal we can go conservatively and.  Ideally he need to have a TEE to clarify those lesions however because of his IPF and low blood pressure that will be somewhat high risk procedure if we can avoid I prefer that approach.  Again we will do inflammatory markers as well as CBC plus differential today hard for me to imagine to have active endocarditis with normal inflammatory markers as well as normal CBC.  What complicates this issue is the fact that he does have idiopathic pulmonary fibrosis which can have markers of inflammation being elevated.  We will see how things will play if those markers are negative then within 2 months we will repeat echocardiogram to look at the valves again.  We will be looking obviously for any evidence of aortic insufficiency. Coronary disease stable from that point review we will continue present management. Essential hypertension his blood pressure actually is on the lower side today ask him to stop enalapril. Dyslipidemia I did review K PN which show me his LDL 58 HDL 28 is a good cholesterol profile we  will continue present management.   Medication Adjustments/Labs and Tests Ordered: Current medicines are reviewed at length with the patient today.  Concerns regarding medicines are outlined above.  No orders of the defined types were placed in this encounter.  Medication changes: No orders of the defined types were placed in this encounter.   Signed, Georgeanna Lea, MD, [facility] 07/28/2021 10:15 AM    [facility]

## 2021-07-28 NOTE — Patient Instructions (Signed)
Medication Instructions:  ?Your physician recommends that you continue on your current medications as directed. Please refer to the Current Medication list given to you today.  ?*If you need a refill on your cardiac medications before your next appointment, please call your pharmacy* ? ? ?Lab Work: ?CBC, Sed Rate, C-Reactive Protein- today ?If you have labs (blood work) drawn today and your tests are completely normal, you will receive your results only by: ?MyChart Message (if you have MyChart) OR ?A paper copy in the mail ?If you have any lab test that is abnormal or we need to change your treatment, we will call you to review the results. ? ? ?Testing/Procedures: ?Your physician has requested that you have an echocardiogram. Echocardiography is a painless test that uses sound waves to create images of your heart. It provides your doctor with information about the size and shape of your heart and how well your heart?s chambers and valves are working. This procedure takes approximately one hour. There are no restrictions for this procedure.  ? ? ?Follow-Up: ?At [facility], you and your health needs are our priority.  As part of our continuing mission to provide you with exceptional heart care, we have created designated Provider Care Teams.  These Care Teams include your primary Cardiologist (physician) and Advanced Practice Providers (APPs -  Physician Assistants and Nurse Practitioners) who all work together to provide you with the care you need, when you need it. ? ?We recommend signing up for the patient portal called "MyChart".  Sign up information is provided on this After Visit Summary.  MyChart is used to connect with patients for Virtual Visits (Telemedicine).  Patients are able to view lab/test results, encounter notes, upcoming appointments, etc.  Non-urgent messages can be sent to your provider as well.   ?To learn more about what you can do with MyChart, go to [email or web address].   ? ?Your  next appointment:   ?2 month(s)  after Echocardiogram ? ?The format for your next appointment:   ?In Person ? ?Provider:   ?Jenne Campus, MD  ? ? ?Other Instructions ?NA  ?

## 2021-07-29 ENCOUNTER — Telehealth: Payer: Self-pay

## 2021-07-29 LAB — CBC WITH DIFFERENTIAL/PLATELET
Basophils Absolute: 0 10*3/uL (ref 0.0–0.2)
Basos: 1 %
EOS (ABSOLUTE): 0 10*3/uL (ref 0.0–0.4)
Eos: 0 %
Hematocrit: 47.4 % (ref 37.5–51.0)
Hemoglobin: 15.9 g/dL (ref 13.0–17.7)
Immature Grans (Abs): 0.1 10*3/uL (ref 0.0–0.1)
Immature Granulocytes: 1 %
Lymphocytes Absolute: 1.5 10*3/uL (ref 0.7–3.1)
Lymphs: 21 %
MCH: 32.8 pg (ref 26.6–33.0)
MCHC: 33.5 g/dL (ref 31.5–35.7)
MCV: 98 fL — ABNORMAL HIGH (ref 79–97)
Monocytes Absolute: 0.9 10*3/uL (ref 0.1–0.9)
Monocytes: 13 %
Neutrophils Absolute: 4.7 10*3/uL (ref 1.4–7.0)
Neutrophils: 64 %
Platelets: 243 10*3/uL (ref 150–450)
RBC: 4.85 x10E6/uL (ref 4.14–5.80)
RDW: 12.7 % (ref 11.6–15.4)
WBC: 7.2 10*3/uL (ref 3.4–10.8)

## 2021-07-29 LAB — SEDIMENTATION RATE: Sed Rate: 34 mm/hr — ABNORMAL HIGH (ref 0–30)

## 2021-07-29 LAB — HIGH SENSITIVITY CRP: CRP, High Sensitivity: 0.95 mg/L (ref 0.00–3.00)

## 2021-07-29 NOTE — Telephone Encounter (Signed)
-----   Message from Park Liter, MD sent at 07/29/2021  1:55 PM EDT ----- ?Test showing no evidence of infection.  Please schedule him to have another echocardiogram in 2 months ?

## 2021-07-29 NOTE — Telephone Encounter (Signed)
Patient notified of results and has Echo set up on 07/10 ?

## 2021-08-12 DIAGNOSIS — R3914 Feeling of incomplete bladder emptying: Secondary | ICD-10-CM | POA: Diagnosis not present

## 2021-08-12 DIAGNOSIS — R351 Nocturia: Secondary | ICD-10-CM | POA: Diagnosis not present

## 2021-08-12 DIAGNOSIS — N401 Enlarged prostate with lower urinary tract symptoms: Secondary | ICD-10-CM | POA: Diagnosis not present

## 2021-08-12 DIAGNOSIS — R3912 Poor urinary stream: Secondary | ICD-10-CM | POA: Diagnosis not present

## 2021-08-18 ENCOUNTER — Telehealth: Payer: Self-pay | Admitting: Internal Medicine

## 2021-08-18 MED ORDER — PAXLOVID (300/100) 20 X 150 MG & 10 X 100MG PO TBPK: 1.0000 | ORAL_TABLET | Freq: Two times a day (BID) | ORAL | 0 refills | Status: AC

## 2021-08-18 NOTE — Telephone Encounter (Signed)
PAXLOVID   Paxlovid (nirmatelvir 300/Ritonavir100) - BID x 5 days - for GFR >= 60  PLEASE CHECK MED LIST for the following issues. Please check the 2 different condition related to concomitant medications in alphabetical order  If Robert Lang with DOB [birth year] is on any of the following Strong CYP3A inhibtors - this patient Robert Lang should withold these concomitant meds so they can start paxlovid stratight away. If taking any of these:  alfuzosin, amiodarone, clozapine, colchicine, dihydroergotamine, dronedarone, ergotamine, flecainide, lovastatin, lurasidone, methylergonovine, midazolam [oral], pethidine, pimozide, propafenone, propoxyphene, quinidine, ranolazine, sildenafil simvastatin, triazolam).   If   Robert Lang  with dob 10/20/1950 Is on any of these other strong CYP3A inducers then starting paxlovid should be delayed and the following meds should wash out first. These are dapalutamide, carbamazepine, phenobarbital, phenytoin, rifampin, St John's wort) - let me know immediately and we should delay starting paxlovid by some days even if he stops these medication.    PLEASE INFORM Robert Lang  OF FOLLOWING SIDE EFFECTS  Side effects - all < 5%  - skin rash (and veyr rare a conditon called TEN) - angiomedia  - myalgia - jaundice - high bP (1%) - loss of taste  - diarrhea - rebond   MEDS  Current Outpatient Medications:    acetaminophen (TYLENOL) 325 MG tablet, Take 2 tablets (650 mg total) by mouth every 6 (six) hours as needed. (Patient taking differently: Take 650 mg by mouth every 6 (six) hours as needed for mild pain or moderate pain.), Disp: , Rfl:    atorvastatin (LIPITOR) 80 MG tablet, Take 1 tablet (80 mg total) by mouth at bedtime., Disp: 90 tablet, Rfl: 3   buPROPion (WELLBUTRIN XL) 300 MG 24 hr tablet, Take 1 tablet (300 mg total) by mouth daily., Disp: 90 tablet, Rfl: 0   busPIRone (BUSPAR) 15 MG tablet, Take 15 mg by mouth 2 (two) times  daily., Disp: , Rfl:    Cholecalciferol 25 MCG (1000 UT) tablet, Take 1,000 Units by mouth daily. , Disp: , Rfl:    docusate sodium (COLACE) 100 MG capsule, Take 100 mg by mouth at bedtime., Disp: , Rfl:    enalapril (VASOTEC) 2.5 MG tablet, TAKE 1 TABLET(2.5 MG) BY MOUTH DAILY (Patient taking differently: Take 2.5 mg by mouth daily.), Disp: 90 tablet, Rfl: 3   famotidine (PEPCID) 20 MG tablet, Take 1 tablet (20 mg total) by mouth at bedtime., Disp: 90 tablet, Rfl: 3   fexofenadine (ALLEGRA) 180 MG tablet, Take 180 mg by mouth at bedtime. , Disp: , Rfl:    finasteride (PROSCAR) 5 MG tablet, Take 1 tablet (5 mg total) by mouth daily., Disp: 90 tablet, Rfl: 1   fluticasone (FLONASE) 50 MCG/ACT nasal spray, Place 1 spray into both nostrils daily as needed for allergies or rhinitis., Disp: , Rfl:    gabapentin (NEURONTIN) 100 MG capsule, TAKE 4 CAPSULES BY MOUTH AT BEDTIME (Patient taking differently: Take 100 mg by mouth at bedtime. TAKE 4 CAPSULES BY MOUTH AT BEDTIME), Disp: 120 capsule, Rfl: 3   Ginger, Zingiber officinalis, (GINGER ROOT) 550 MG CAPS, Take 550 mg by mouth daily., Disp: , Rfl:    melatonin 5 MG TABS, Take 10 mg by mouth at bedtime., Disp: , Rfl:    Multiple Vitamins-Minerals (PRESERVISION AREDS PO), Take 1 capsule by mouth in the morning and at bedtime., Disp: , Rfl:    nitroGLYCERIN (NITROSTAT) 0.4 MG SL tablet, Place 1 tablet (0.4 mg total) under the tongue every  5 (five) minutes as needed for chest pain., Disp: 25 tablet, Rfl: 3   ondansetron (ZOFRAN) 4 MG tablet, TAKE 1 TABLET(4 MG) BY MOUTH EVERY 8 HOURS AS NEEDED FOR NAUSEA OR VOMITING (Patient taking differently: Take 4 mg by mouth every 8 (eight) hours as needed for vomiting or nausea.), Disp: 20 tablet, Rfl: 0   pantoprazole (PROTONIX) 40 MG tablet, Take 1 tablet (40 mg total) by mouth daily., Disp: 90 tablet, Rfl: 3   Pirfenidone (ESBRIET) 801 MG TABS, Take 801 mg by mouth 3 (three) times daily., Disp: 270 tablet, Rfl: 1    Polyethylene Glycol 3350 (MIRALAX PO), Take 17 g by mouth at bedtime., Disp: , Rfl:    ranolazine (RANEXA) 1000 MG SR tablet, Take 1 tablet (1,000 mg total) by mouth 2 (two) times daily., Disp: 180 tablet, Rfl: 1   rivaroxaban (XARELTO) 20 MG TABS tablet, TAKE 1 TABLET(20 MG) BY MOUTH DAILY WITH SUPPER (Patient taking differently: Take 20 mg by mouth daily with supper.), Disp: 30 tablet, Rfl: 5   sertraline (ZOLOFT) 100 MG tablet, Take 100 mg by mouth at bedtime., Disp: , Rfl:    Sodium Sulfate-Mag Sulfate-KCl (SUTAB) [phone] MG TABS, Take 1 kit by mouth as directed., Disp: 24 tablet, Rfl: 0   solifenacin (VESICARE) 5 MG tablet, Take 5 mg by mouth at bedtime., Disp: , Rfl:    sucralfate (CARAFATE) 1 g tablet, Take 1 tablet (1 g total) by mouth 4 (four) times daily -  with meals and at bedtime., Disp: 120 tablet, Rfl: 0   traZODone (DESYREL) 50 MG tablet, Take 0.5-1 tablets (25-50 mg total) by mouth at bedtime as needed for sleep., Disp: 30 tablet, Rfl: 3   vitamin B-12 (CYANOCOBALAMIN) 1000 MCG tablet, Take 1,000 mcg by mouth daily., Disp: , Rfl:     Latest Reference Range & Units 09/06/18 09:24 01/10/19 13:28 01/11/19 02:23 04/20/19 08:07 10/19/19 01:40 01/25/20 11:34 03/05/20 13:43 04/30/20 11:26 08/12/20 09:52 10/13/20 09:23 01/15/21 12:55 01/16/21 04:18 01/18/21 04:18 01/19/21 01:14 04/20/21 09:06 05/05/21 08:35 05/13/21 22:14 05/18/21 11:04  Creatinine 0.40 - 1.50 mg/dL 0.87 0.76 0.90 0.89 0.75 0.95 1.07 0.95 0.76 0.85 0.94 0.82 0.72 0.76 0.86 0.82 0.89 0.92

## 2021-08-18 NOTE — Telephone Encounter (Signed)
Spoke with pt and notified of response per MR  Pt verbalized understanding  Rx sent to pharm

## 2021-08-18 NOTE — Telephone Encounter (Signed)
Spoke with the pt's spouse  She states pt tested pos for covid 19 of 08/17/21  Symptoms started the night prior- 08/16/21  He is having wheezing, cough- non prod, PND, congestion and fever 101.1  Sats 92% ra  He denies increased SOB  He is on flonase, allegra, esbriet, xarelto He has had all covid vaccines including booster  Last CMET in Epic 05/18/21  Please advise, thanks!  Allergies  Allergen Reactions   Naproxen Other (See Comments)    (Naprosyn *ANALGESICS - ANTI-INFLAMMATORY*) Nausea, Abdominal pain

## 2021-08-20 ENCOUNTER — Ambulatory Visit: Payer: Self-pay | Admitting: *Deleted

## 2021-08-20 NOTE — Telephone Encounter (Signed)
  Chief Complaint: Fall Symptoms: [place] few minutes prior to call. "Passed out" Wife states hit head, on blood thinners. Pt states lightheaded. Tested positive for Covid Monday Frequency: today Pertinent Negatives: Patient denies  Disposition: '[x]'$ ED /'[]'$ Urgent Care (no appt availability in office) / '[]'$ Appointment(In office/virtual)/ '[]'$  [facility]/ '[]'$ Home Care/ '[]'$ Refused Recommended Disposition /'[]'$ [facility]/ '[]'$  Follow-up with PCP Additional Notes: Advised EMS, states will follow disposition. Called on community line. Reason for Disposition  Patient sounds very sick or weak to the triager  Answer Assessment - Initial Assessment Questions 1. MECHANISM: "How did the fall happen?"     Passed out 2. DOMESTIC VIOLENCE AND ELDER ABUSE SCREENING: "Did you fall because someone pushed you or tried to hurt you?" If Yes, ask: "Are you safe now?"     no 3. ONSET: "When did the fall happen?" (e.g., minutes, hours, or days ago)     Few minutes ago 4. LOCATION: "What part of the body hit the ground?" (e.g., back, buttocks, head, hips, knees, hands, head, stomach)     Maybe hit 5. INJURY: "Did you hurt (injure) yourself when you fell?" If Yes, ask: "What did you injure? Tell me more about this?" (e.g., body area; type of injury; pain severity)"     Neck 6. PAIN: "Is there any pain?" If Yes, ask: "How bad is the pain?" (e.g., Scale 1-10; or mild,  moderate, severe)   - NONE (0): No pain   - MILD (1-3): Doesn't interfere with normal activities    - MODERATE (4-7): Interferes with normal activities or awakens from sleep    - SEVERE (8-10): Excruciating pain, unable to do any normal activities      Mild 7. SIZE: For cuts, bruises, or swelling, ask: "How large is it?" (e.g., inches or centimeters)      None 9. OTHER SYMPTOMS: "Do you have any other symptoms?" (e.g., dizziness, fever, weakness; new onset or worsening).      Dizziness 10. CAUSE: "What do you think caused the fall (or  falling)?" (e.g., tripped, dizzy spell)       "Passed out"  Protocols used: Falls and [facility]

## 2021-08-25 DIAGNOSIS — G4733 Obstructive sleep apnea (adult) (pediatric): Secondary | ICD-10-CM | POA: Diagnosis not present

## 2021-08-25 DIAGNOSIS — R0602 Shortness of breath: Secondary | ICD-10-CM | POA: Diagnosis not present

## 2021-08-25 DIAGNOSIS — I4510 Unspecified right bundle-branch block: Secondary | ICD-10-CM | POA: Diagnosis not present

## 2021-08-25 DIAGNOSIS — Z87891 Personal history of nicotine dependence: Secondary | ICD-10-CM | POA: Diagnosis not present

## 2021-08-25 DIAGNOSIS — R42 Dizziness and giddiness: Secondary | ICD-10-CM | POA: Diagnosis not present

## 2021-08-25 DIAGNOSIS — I444 Left anterior fascicular block: Secondary | ICD-10-CM | POA: Diagnosis not present

## 2021-08-25 DIAGNOSIS — I452 Bifascicular block: Secondary | ICD-10-CM | POA: Diagnosis not present

## 2021-08-25 DIAGNOSIS — Z8616 Personal history of COVID-19: Secondary | ICD-10-CM | POA: Diagnosis not present

## 2021-08-25 DIAGNOSIS — K219 Gastro-esophageal reflux disease without esophagitis: Secondary | ICD-10-CM | POA: Diagnosis not present

## 2021-08-25 DIAGNOSIS — R55 Syncope and collapse: Secondary | ICD-10-CM | POA: Diagnosis not present

## 2021-08-25 DIAGNOSIS — I2510 Atherosclerotic heart disease of native coronary artery without angina pectoris: Secondary | ICD-10-CM | POA: Diagnosis not present

## 2021-08-25 DIAGNOSIS — R739 Hyperglycemia, unspecified: Secondary | ICD-10-CM | POA: Diagnosis not present

## 2021-08-25 DIAGNOSIS — E785 Hyperlipidemia, unspecified: Secondary | ICD-10-CM | POA: Diagnosis not present

## 2021-08-25 DIAGNOSIS — J449 Chronic obstructive pulmonary disease, unspecified: Secondary | ICD-10-CM | POA: Diagnosis not present

## 2021-08-25 DIAGNOSIS — R531 Weakness: Secondary | ICD-10-CM | POA: Diagnosis not present

## 2021-08-25 DIAGNOSIS — I440 Atrioventricular block, first degree: Secondary | ICD-10-CM | POA: Diagnosis not present

## 2021-08-25 DIAGNOSIS — J84112 Idiopathic pulmonary fibrosis: Secondary | ICD-10-CM | POA: Diagnosis not present

## 2021-08-25 DIAGNOSIS — Z79899 Other long term (current) drug therapy: Secondary | ICD-10-CM | POA: Diagnosis not present

## 2021-08-25 DIAGNOSIS — I4891 Unspecified atrial fibrillation: Secondary | ICD-10-CM | POA: Diagnosis not present

## 2021-08-25 DIAGNOSIS — I10 Essential (primary) hypertension: Secondary | ICD-10-CM | POA: Diagnosis not present

## 2021-08-25 DIAGNOSIS — Z7901 Long term (current) use of anticoagulants: Secondary | ICD-10-CM | POA: Diagnosis not present

## 2021-08-26 ENCOUNTER — Emergency Department (HOSPITAL_COMMUNITY): Payer: Medicare Other

## 2021-08-26 ENCOUNTER — Encounter (HOSPITAL_COMMUNITY): Payer: Self-pay | Admitting: Emergency Medicine

## 2021-08-26 ENCOUNTER — Observation Stay (HOSPITAL_COMMUNITY)
Admission: EM | Admit: 2021-08-26 | Discharge: 2021-08-28 | Disposition: A | Discharge status: Home / Self Care | Payer: Medicare Other | Attending: Internal Medicine | Admitting: Internal Medicine

## 2021-08-26 ENCOUNTER — Telehealth: Payer: Self-pay | Admitting: Internal Medicine

## 2021-08-26 ENCOUNTER — Other Ambulatory Visit: Payer: Self-pay

## 2021-08-26 DIAGNOSIS — Z87891 Personal history of nicotine dependence: Secondary | ICD-10-CM | POA: Insufficient documentation

## 2021-08-26 DIAGNOSIS — J449 Chronic obstructive pulmonary disease, unspecified: Secondary | ICD-10-CM | POA: Diagnosis not present

## 2021-08-26 DIAGNOSIS — R531 Weakness: Secondary | ICD-10-CM | POA: Insufficient documentation

## 2021-08-26 DIAGNOSIS — Z955 Presence of coronary angioplasty implant and graft: Secondary | ICD-10-CM | POA: Insufficient documentation

## 2021-08-26 DIAGNOSIS — R296 Repeated falls: Secondary | ICD-10-CM | POA: Diagnosis present

## 2021-08-26 DIAGNOSIS — R55 Syncope and collapse: Principal | ICD-10-CM | POA: Diagnosis present

## 2021-08-26 DIAGNOSIS — I2510 Atherosclerotic heart disease of native coronary artery without angina pectoris: Secondary | ICD-10-CM | POA: Diagnosis not present

## 2021-08-26 DIAGNOSIS — I4891 Unspecified atrial fibrillation: Secondary | ICD-10-CM | POA: Diagnosis not present

## 2021-08-26 DIAGNOSIS — E871 Hypo-osmolality and hyponatremia: Secondary | ICD-10-CM | POA: Diagnosis not present

## 2021-08-26 DIAGNOSIS — G4733 Obstructive sleep apnea (adult) (pediatric): Secondary | ICD-10-CM | POA: Diagnosis present

## 2021-08-26 DIAGNOSIS — Z7901 Long term (current) use of anticoagulants: Secondary | ICD-10-CM

## 2021-08-26 DIAGNOSIS — Z951 Presence of aortocoronary bypass graft: Secondary | ICD-10-CM | POA: Insufficient documentation

## 2021-08-26 DIAGNOSIS — J84112 Idiopathic pulmonary fibrosis: Secondary | ICD-10-CM | POA: Diagnosis not present

## 2021-08-26 DIAGNOSIS — I452 Bifascicular block: Secondary | ICD-10-CM | POA: Diagnosis present

## 2021-08-26 DIAGNOSIS — I10 Essential (primary) hypertension: Secondary | ICD-10-CM | POA: Diagnosis not present

## 2021-08-26 DIAGNOSIS — R4182 Altered mental status, unspecified: Secondary | ICD-10-CM | POA: Diagnosis not present

## 2021-08-26 DIAGNOSIS — R443 Hallucinations, unspecified: Secondary | ICD-10-CM | POA: Insufficient documentation

## 2021-08-26 DIAGNOSIS — Z79899 Other long term (current) drug therapy: Secondary | ICD-10-CM | POA: Insufficient documentation

## 2021-08-26 DIAGNOSIS — R410 Disorientation, unspecified: Secondary | ICD-10-CM | POA: Diagnosis not present

## 2021-08-26 DIAGNOSIS — G9341 Metabolic encephalopathy: Secondary | ICD-10-CM | POA: Diagnosis present

## 2021-08-26 DIAGNOSIS — R001 Bradycardia, unspecified: Secondary | ICD-10-CM | POA: Diagnosis not present

## 2021-08-26 DIAGNOSIS — E872 Acidosis, unspecified: Secondary | ICD-10-CM | POA: Diagnosis not present

## 2021-08-26 DIAGNOSIS — W19XXXA Unspecified fall, initial encounter: Secondary | ICD-10-CM | POA: Diagnosis present

## 2021-08-26 DIAGNOSIS — E860 Dehydration: Secondary | ICD-10-CM | POA: Insufficient documentation

## 2021-08-26 DIAGNOSIS — Z8679 Personal history of other diseases of the circulatory system: Secondary | ICD-10-CM

## 2021-08-26 DIAGNOSIS — Z8616 Personal history of COVID-19: Secondary | ICD-10-CM | POA: Diagnosis present

## 2021-08-26 LAB — CBC WITH DIFFERENTIAL/PLATELET
Abs Immature Granulocytes: 0.12 10*3/uL — ABNORMAL HIGH (ref 0.00–0.07)
Basophils Absolute: 0.1 10*3/uL (ref 0.0–0.1)
Basophils Relative: 1 %
Eosinophils Absolute: 0 10*3/uL (ref 0.0–0.5)
Eosinophils Relative: 0 %
HCT: 46.8 % (ref 39.0–52.0)
Hemoglobin: 15.2 g/dL (ref 13.0–17.0)
Immature Granulocytes: 2 %
Lymphocytes Relative: 22 %
Lymphs Abs: 1.5 10*3/uL (ref 0.7–4.0)
MCH: 32.4 pg (ref 26.0–34.0)
MCHC: 32.5 g/dL (ref 30.0–36.0)
MCV: 99.8 fL (ref 80.0–100.0)
Monocytes Absolute: 0.9 10*3/uL (ref 0.1–1.0)
Monocytes Relative: 13 %
Neutro Abs: 4.4 10*3/uL (ref 1.7–7.7)
Neutrophils Relative %: 62 %
Platelets: 295 10*3/uL (ref 150–400)
RBC: 4.69 MIL/uL (ref 4.22–5.81)
RDW: 12.5 % (ref 11.5–15.5)
WBC: 7 10*3/uL (ref 4.0–10.5)
nRBC: 0 % (ref 0.0–0.2)

## 2021-08-26 LAB — TROPONIN I (HIGH SENSITIVITY)
Troponin I (High Sensitivity): 8 ng/L (ref <18)
Troponin I (High Sensitivity): 9 ng/L (ref <18)

## 2021-08-26 LAB — COMPREHENSIVE METABOLIC PANEL
ALT: 17 U/L (ref 0–44)
AST: 25 U/L (ref 15–41)
Albumin: 3.5 g/dL (ref 3.5–5.0)
Alkaline Phosphatase: 70 U/L (ref 38–126)
Anion gap: 11 (ref 5–15)
BUN: 8 mg/dL (ref 8–23)
CO2: 20 mmol/L — ABNORMAL LOW (ref 22–32)
Calcium: 9 mg/dL (ref 8.9–10.3)
Chloride: 105 mmol/L (ref 98–111)
Creatinine, Ser: 0.77 mg/dL (ref 0.61–1.24)
GFR, Estimated: >60 mL/min (ref >60)
Glucose, Bld: 86 mg/dL (ref 70–99)
Potassium: 4.3 mmol/L (ref 3.5–5.1)
Sodium: 136 mmol/L (ref 135–145)
Total Bilirubin: 0.7 mg/dL (ref 0.3–1.2)
Total Protein: 7.1 g/dL (ref 6.5–8.1)

## 2021-08-26 LAB — CBG MONITORING, ED: Glucose-Capillary: 92 mg/dL (ref 70–99)

## 2021-08-26 MED ORDER — SODIUM CHLORIDE 0.9 % IV BOLUS
1000.0000 mL | Freq: Once | INTRAVENOUS | Status: AC
  Administered 2021-08-27: 1000 mL via INTRAVENOUS

## 2021-08-26 NOTE — ED Notes (Signed)
Patient reevaluated by PA at triage after a near syncopal episode / nausea at waiting area .

## 2021-08-26 NOTE — ED Provider Notes (Signed)
[facility] Emergency Department Provider Note MRN:  [id number]  Arrival date & time: 08/27/21     Chief Complaint   Weakness   History of Present Illness   Robert Lang is a 71 y.o. year-old male with a history of CAD, A-fib, COPD, pulmonary fibrosis presenting to the ED with chief complaint of weakness.  Weakness and altered mental status for the past week.  Multiple syncopal episodes recently.  Admitted to [facility] and underwent thorough evaluation, discharged yesterday.  Recurrent syncopal episodes including in the waiting room.  Continues to be off his baseline.  Question of seizure-like activity in the waiting room.  Review of Systems  A thorough review of systems was obtained and all systems are negative except as noted in the HPI and PMH.   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 M75.00)   Allergic rhinitis due to pollen 09/03/2013   J30.1)  Formatting of this note might be different from the original. J30.1)   Allergy    Anticoagulated 07/01/2016   Anxiety    Arthritis    Atrial fibrillation ([place])    Atrial flutter ([place]) 06/26/2015   CAD (coronary artery disease)    Chest pain 06/26/2015   COPD (chronic obstructive pulmonary disease) ([place])    Coronary artery disease 07/06/2018   Cardiac catheterization 2017 showing 90% small diagonal branch disease   DDD (degenerative disc disease), cervical 09/03/2013   M50.90)  Formatting of this note might be different from the original. M50.90)   Depression    Depression    Depressive disorder 09/03/2013   Dizziness 10/28/2017   Dyslipidemia, goal LDL below 70 07/06/2018   Dyspnea on exertion 10/20/2018   Esophageal reflux 09/03/2013    Essential hypertension 07/06/2018   ETOH abuse 01/11/2019   6 pack of beer per day   Falls 10/28/2017   GERD (gastroesophageal reflux disease)    H/O amiodarone therapy 07/01/2016   Heart attack ([place])    Heart palpitations 01/27/2017   History of colon polyps    Hyperlipidemia    Hypertension    IPF (idiopathic pulmonary fibrosis) ([place])    Kidney stones    Neck pain 09/03/2013   Neuropathy 07/06/2018   Obstructive sleep apnea 08/05/2015   PLMD (periodic limb movement disorder) 11/04/2015   Polycythemia, 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 STORY: Due to alcohol/ tobacco   Pure hypercholesterolemia 09/03/2013   E78.0)  Formatting of this note might be different from the original. E78.0)   Screening for prostate cancer 09/03/2013   Smoker 01/11/2019   Smoking 07/06/2018   Status post ablation of atrial flutter 07/06/2018   2017    Past Surgical History:  Procedure Laterality Date   ATRIAL FIBRILLATION ABLATION  10/2015   BACK SURGERY     CARDIOVERSION  2017   CATARACT EXTRACTION Bilateral 2017   March and April 2017   COLONOSCOPY  2018   CORONARY STENT PLACEMENT  2012   CORONARY/GRAFT ACUTE MI REVASCULARIZATION N/A 01/10/2019   Procedure: CORONARY/GRAFT ACUTE MI REVASCULARIZATION;  Surgeon: Leonie Man, MD;  Location: [facility];  Service: Cardiovascular;  Laterality: N/A;   INGUINAL HERNIA REPAIR  over 20 years ago   LAPAROSCOPIC APPENDECTOMY N/A 01/17/2021   Procedure: APPENDECTOMY LAPAROSCOPIC;  Surgeon: Stechschulte, Nickola Major, MD;  Location: [facility];  Service: General;  Laterality: N/A;   LEFT HEART CATH AND CORONARY ANGIOGRAPHY N/A 01/10/2019   Procedure: LEFT HEART CATH AND CORONARY ANGIOGRAPHY;  Surgeon: Leonie Man, MD;  Location: [place] [facility];  Service: Cardiovascular;  Laterality: N/A;   LUMBAR LAMINECTOMY Bilateral 04/05/2016   L2-L5    VENTRAL HERNIA REPAIR   2018    Family History  Problem Relation Age of Onset   Colon cancer Father    Throat cancer Brother     Social History   Socioeconomic History   Marital status: Married    Spouse name: Not on file   Number of children: Not on file   Years of education: Not on file   Highest education level: Not on file  Occupational History   Not on file  Tobacco Use   Smoking status: Former    Packs/day: 1.00    Years: 30.00    Total pack years: 30.00    Types: Cigarettes    Quit date: 01/10/2019    Years since quitting: 2.6   Smokeless tobacco: Former  Scientific laboratory technician Use: Never used  Substance and Sexual Activity   Alcohol use: Yes    Comment: 8oz of wine a day   Drug use: Never   Sexual activity: Not on file  Other Topics Concern   Not on file  Social History Narrative   Not on file   Social Determinants of Health   Financial Resource Strain: Low Risk  (11/25/2020)   Overall Financial Resource Strain (CARDIA)    Difficulty of Paying Living Expenses: Not hard at all  Food Insecurity: No Food Insecurity (11/25/2020)   Hunger Vital Sign    Worried About Running Out of Food in the Last Year: Never true    [place] in the Last Year: Never true  Transportation Needs: No Transportation Needs (11/25/2020)   PRAPARE - Hydrologist (Medical): No    Lack of Transportation (Non-Medical): No  Physical Activity: Inactive (11/25/2020)   Exercise Vital Sign    Days of Exercise per Week: 0 days    Minutes of Exercise per Session: 0 min  Stress: No Stress Concern Present (11/25/2020)   [place]    Feeling of Stress : Not at all  Social Connections: Moderately Integrated (11/25/2020)   Social Connection and Isolation Panel [NHANES]    Frequency of Communication with Friends and Family: More than three times a week    Frequency of Social Gatherings with Friends and Family: More than three times  a week    Attends Religious Services: More than 4 times per year    Active Member of [employer] or Organizations: No    Attends Archivist Meetings: Never    Marital Status: Married  Human resources officer Violence: Not At Risk (11/25/2020)   Humiliation, Afraid, Rape, and Kick questionnaire    Fear of Current or Ex-Partner: No    Emotionally Abused: No    Physically Abused: No    Sexually Abused: No     Physical Exam   Vitals:   08/27/21 0245 08/27/21 0345  BP: 115/68 (!) 147/88  Pulse: 83 98  Resp: 15 (!) 21  Temp:    SpO2: 96% 93%    CONSTITUTIONAL: Ill-appearing NEURO/PSYCH:  Waxing and waning mental status, occasionally can follow commands, occasionally stares off into space with no blink to threat EYES:  eyes equal and reactive ENT/NECK:  no LAD, no JVD CARDIO: Regular rate, well-perfused, normal S1 and S2 PULM:  CTAB no wheezing or rhonchi GI/GU:  non-distended, non-tender MSK/SPINE:  No gross deformities, no edema SKIN:  no rash, atraumatic   *Additional and/or pertinent findings included in MDM below  Diagnostic and Interventional Summary    EKG Interpretation  Date/Time:  Wednesday August 26 2021 22:42:52 EDT Ventricular Rate:  88 PR Interval:  236 QRS Duration: 126 QT Interval:  388 QTC Calculation: 469 R Axis:   -50 Text Interpretation: Sinus rhythm with 1st degree A-V block Right bundle branch block Left anterior fascicular block  Bifascicular block  Cannot rule out Inferior infarct (masked by fascicular block?) , age undetermined Abnormal ECG When compared with ECG of 26-Aug-2021 18:14, PREVIOUS ECG IS PRESENT unchanged from prior Confirmed by Noemi Chapel [phone]) on 08/26/2021 10:46:14 PM       Labs Reviewed  CBC WITH DIFFERENTIAL/PLATELET - Abnormal; Notable for the following components:      Result Value   Abs Immature Granulocytes 0.12 (*)    All other components within normal limits  COMPREHENSIVE METABOLIC PANEL - Abnormal; Notable for the  following components:   CO2 20 (*)    All other components within normal limits  LACTIC ACID, PLASMA - Abnormal; Notable for the following components:   Lactic Acid, Venous 2.7 (*)    All other components within normal limits  CULTURE, BLOOD (ROUTINE X 2)  CULTURE, BLOOD (ROUTINE X 2)  URINALYSIS, ROUTINE W REFLEX MICROSCOPIC  MAGNESIUM  CBG MONITORING, ED  TROPONIN I (HIGH SENSITIVITY)  TROPONIN I (HIGH SENSITIVITY)    CT HEAD WO CONTRAST (5MM)  Final Result    DG Chest 2 View  Final Result      Medications  sodium chloride 0.9 % bolus 1,000 mL (0 mLs Intravenous Stopped 08/27/21 0355)  HYDROmorphone (DILAUDID) injection 1 mg (1 mg Intravenous Given 08/27/21 0240)     Procedures  /  Critical Care Procedures  ED Course and Medical Decision Making  Initial Impression and Ddx Syncope versus seizure, per chart review recent COVID-19 Paxlovid, was hospitalized and underwent CTA that was negative for PE, MRI that did not show any acute abnormalities.  Syncopal episodes felt to be multifactorial in the setting of interstitial lung disease, hypoxia, dehydration, new medication.  Per family at bedside he has not been back to his baseline in 1 week.  On my exam there is some increased concern for possible seizure activity.  Had some focal left leg tremulous activity prior to staring into space with no blink to threat.  Will repeat CT head, anticipating consultation with neurology.  Other considerations include underlying infection or sepsis, electrolyte disarray.  Past medical/surgical history that increases complexity of ED encounter: CAD, COPD, interstitial lung disease  Interpretation of Diagnostics I personally reviewed the EKG and my interpretation is as follows: Sinus rhythm  Labs are overall reassuring with no significant blood count or electrolyte disturbance.  Patient Reassessment and Ultimate Disposition/Management     Awaiting formal neurology recommendations, admitted to  hospital service for further care.  Patient management required discussion with the following services or consulting groups:  Hospitalist Service and Neurology  Complexity of Problems Addressed Acute illness or injury that poses threat of life of bodily function  Additional Data Reviewed and Analyzed Further history obtained from: Further  history from spouse/family member  Additional Factors Impacting ED Encounter Risk Consideration of hospitalization  Barth Kirks. [place]onia Small, [place] [email or web address]  Final Clinical Impressions(s) / ED Diagnoses     ICD-10-CM   1. Weakness  R53.1     2. Altered mental status, unspecified altered mental status type  R41.82       ED Discharge Orders     None        Discharge Instructions Discussed with and Provided to Patient:   Discharge Instructions   None      Maudie Flakes, MD 08/27/21 [phone]

## 2021-08-26 NOTE — Telephone Encounter (Signed)
FY ONLY.  Patient wife wants to let Dr Chase Caller know that patient took paxlovid and completed it on Saturday. Wife states that patient had passed out yesterday. He went to the hospital and they did blood work and xray and they told him everything was fine. But they told him it might have been a reaction to the medication. Patient is fine and stable and at home. Wife just wanted to call and give an update to [place]. Nothing further needed

## 2021-08-26 NOTE — ED Triage Notes (Signed)
Pt BIB [facility] with reports of general weakness and general malaise. Pt Dx with COVID 1 week ago.

## 2021-08-26 NOTE — ED Notes (Signed)
Pt provided with an emesis bag as the pt is becoming more and more nauseous.

## 2021-08-26 NOTE — ED Notes (Signed)
Myself and Tia were waived down by pt's wife as husband became diaphoretic and pale. Pt appeared to be falling out of wheelchair. Pt appeared to be having a syncopal episode, ED techs immediately went over and lifted pt up and sternal rubbed pt and pt did not respond appropriately, as pt still appeared weak but was able to sit up with help. Pt was immediately taken via wheelchair to triage and vitals were reassessed and care and report given to PA and RN in triage. CBG obtained and EKG taken to Dr and Dr stated for room to be provided for pt based off of pts presentation. Triage RN made charge RN aware.

## 2021-08-26 NOTE — ED Provider Triage Note (Signed)
Emergency Medicine Provider Triage Evaluation Note  Robert Lang , a 71 y.o. male  was evaluated in triage.  Pt complains of generalized weakness.  He had a syncopal episode yesterday, was [facility] and discharged this morning at 11 AM.  Patient states he feels very weak like he is going to pass out but has not yet.Marland Kitchen  COVID a week ago, finished the Paxlovid.  No nausea or vomiting or abdominal pain, he does feel short of breath.  Review of Systems  Per HPI  Physical Exam  There were no vitals taken for this visit. Gen:   Awake, no distress   Resp:  Normal effort  MSK:   Moves extremities without difficulty  Other:  Crackles  Medical Decision Making  Medically screening exam initiated at 6:16 PM.  Appropriate orders placed.  Hager Compston was informed that the remainder of the evaluation will be completed by another provider, this initial triage assessment does not replace that evaluation, and the importance of remaining in the ED until their evaluation is complete.     Sherrill Raring, [name] 08/26/21 1816

## 2021-08-27 ENCOUNTER — Observation Stay (HOSPITAL_COMMUNITY): Payer: Medicare Other

## 2021-08-27 ENCOUNTER — Encounter (HOSPITAL_COMMUNITY): Payer: Self-pay | Admitting: Internal Medicine

## 2021-08-27 DIAGNOSIS — I452 Bifascicular block: Secondary | ICD-10-CM | POA: Diagnosis not present

## 2021-08-27 DIAGNOSIS — R4020 Unspecified coma: Secondary | ICD-10-CM | POA: Diagnosis not present

## 2021-08-27 DIAGNOSIS — R55 Syncope and collapse: Secondary | ICD-10-CM

## 2021-08-27 DIAGNOSIS — J432 Centrilobular emphysema: Secondary | ICD-10-CM

## 2021-08-27 DIAGNOSIS — W19XXXA Unspecified fall, initial encounter: Secondary | ICD-10-CM

## 2021-08-27 DIAGNOSIS — I2510 Atherosclerotic heart disease of native coronary artery without angina pectoris: Secondary | ICD-10-CM

## 2021-08-27 DIAGNOSIS — E872 Acidosis, unspecified: Secondary | ICD-10-CM

## 2021-08-27 DIAGNOSIS — J84112 Idiopathic pulmonary fibrosis: Secondary | ICD-10-CM

## 2021-08-27 DIAGNOSIS — Z7901 Long term (current) use of anticoagulants: Secondary | ICD-10-CM | POA: Diagnosis not present

## 2021-08-27 DIAGNOSIS — G4733 Obstructive sleep apnea (adult) (pediatric): Secondary | ICD-10-CM

## 2021-08-27 DIAGNOSIS — R4182 Altered mental status, unspecified: Secondary | ICD-10-CM

## 2021-08-27 DIAGNOSIS — G9341 Metabolic encephalopathy: Secondary | ICD-10-CM

## 2021-08-27 DIAGNOSIS — Z8616 Personal history of COVID-19: Secondary | ICD-10-CM

## 2021-08-27 LAB — LACTIC ACID, PLASMA
Lactic Acid, Venous: 1.2 mmol/L (ref 0.5–1.9)
Lactic Acid, Venous: 2.7 mmol/L (ref 0.5–1.9)

## 2021-08-27 LAB — URINALYSIS, ROUTINE W REFLEX MICROSCOPIC
Bilirubin Urine: NEGATIVE
Glucose, UA: NEGATIVE mg/dL
Hgb urine dipstick: NEGATIVE
Ketones, ur: NEGATIVE mg/dL
Leukocytes,Ua: NEGATIVE
Nitrite: NEGATIVE
Protein, ur: NEGATIVE mg/dL
Specific Gravity, Urine: 1.014 (ref 1.005–1.030)
pH: 7 (ref 5.0–8.0)

## 2021-08-27 LAB — MAGNESIUM: Magnesium: 2.2 mg/dL (ref 1.7–2.4)

## 2021-08-27 MED ORDER — ACETAMINOPHEN 325 MG PO TABS
650.0000 mg | ORAL_TABLET | Freq: Four times a day (QID) | ORAL | Status: DC | PRN
  Administered 2021-08-27 – 2021-08-28 (×3): 650 mg via ORAL
  Filled 2021-08-27 (×3): qty 2

## 2021-08-27 MED ORDER — RIVAROXABAN 20 MG PO TABS
20.0000 mg | ORAL_TABLET | Freq: Every day | ORAL | Status: DC
  Administered 2021-08-27: 20 mg via ORAL
  Filled 2021-08-27: qty 2

## 2021-08-27 MED ORDER — ALBUTEROL SULFATE (2.5 MG/3ML) 0.083% IN NEBU: 2.5000 mg | INHALATION_SOLUTION | Freq: Four times a day (QID) | RESPIRATORY_TRACT | Status: DC | PRN

## 2021-08-27 MED ORDER — GABAPENTIN 100 MG PO CAPS
100.0000 mg | ORAL_CAPSULE | Freq: Every day | ORAL | Status: DC
  Administered 2021-08-27: 100 mg via ORAL
  Filled 2021-08-27: qty 1

## 2021-08-27 MED ORDER — TAMSULOSIN HCL 0.4 MG PO CAPS
0.4000 mg | ORAL_CAPSULE | Freq: Every day | ORAL | Status: DC
  Administered 2021-08-27: 0.4 mg via ORAL
  Filled 2021-08-27: qty 1

## 2021-08-27 MED ORDER — LORAZEPAM 2 MG/ML IJ SOLN
0.5000 mg | Freq: Four times a day (QID) | INTRAMUSCULAR | Status: DC | PRN
Start: 2021-08-27 — End: 2021-08-28
  Administered 2021-08-27: 0.5 mg via INTRAVENOUS
  Filled 2021-08-27: qty 1

## 2021-08-27 MED ORDER — FAMOTIDINE 20 MG PO TABS
40.0000 mg | ORAL_TABLET | Freq: Every day | ORAL | Status: DC
  Administered 2021-08-27: 40 mg via ORAL
  Filled 2021-08-27: qty 2

## 2021-08-27 MED ORDER — LORATADINE 10 MG PO TABS: 10.0000 mg | ORAL_TABLET | Freq: Every day | ORAL | Status: DC | PRN

## 2021-08-27 MED ORDER — POLYVINYL ALCOHOL 1.4 % OP SOLN
1.0000 [drp] | OPHTHALMIC | Status: DC | PRN
  Administered 2021-08-28: 1 [drp] via OPHTHALMIC
  Filled 2021-08-27: qty 15

## 2021-08-27 MED ORDER — MELATONIN 5 MG PO TABS
10.0000 mg | ORAL_TABLET | Freq: Every day | ORAL | Status: DC
  Administered 2021-08-27: 10 mg via ORAL
  Filled 2021-08-27: qty 2

## 2021-08-27 MED ORDER — FINASTERIDE 5 MG PO TABS
5.0000 mg | ORAL_TABLET | Freq: Every day | ORAL | Status: DC
  Administered 2021-08-27: 5 mg via ORAL
  Filled 2021-08-27: qty 1

## 2021-08-27 MED ORDER — SODIUM CHLORIDE 0.9 % IV SOLN: INTRAVENOUS | Status: DC

## 2021-08-27 MED ORDER — HYDROMORPHONE HCL 1 MG/ML IJ SOLN
1.0000 mg | Freq: Once | INTRAMUSCULAR | Status: AC
  Administered 2021-08-27: 1 mg via INTRAVENOUS
  Filled 2021-08-27: qty 1

## 2021-08-27 MED ORDER — ATORVASTATIN CALCIUM 80 MG PO TABS
80.0000 mg | ORAL_TABLET | Freq: Every day | ORAL | Status: DC
  Administered 2021-08-27: 80 mg via ORAL
  Filled 2021-08-27: qty 1

## 2021-08-27 MED ORDER — RANOLAZINE ER 500 MG PO TB12
1000.0000 mg | ORAL_TABLET | Freq: Two times a day (BID) | ORAL | Status: DC
  Administered 2021-08-27 – 2021-08-28 (×2): 1000 mg via ORAL
  Filled 2021-08-27 (×5): qty 2

## 2021-08-27 MED ORDER — PANTOPRAZOLE SODIUM 40 MG PO TBEC
40.0000 mg | DELAYED_RELEASE_TABLET | Freq: Every day | ORAL | Status: DC
  Administered 2021-08-27 – 2021-08-28 (×2): 40 mg via ORAL
  Filled 2021-08-27 (×2): qty 1

## 2021-08-27 MED ORDER — ACETAMINOPHEN 650 MG RE SUPP: 650.0000 mg | Freq: Four times a day (QID) | RECTAL | Status: DC | PRN

## 2021-08-27 NOTE — Progress Notes (Signed)
LTM EEG hooked up and running - no initial skin breakdown - push button tested - neuro notified. [facility] monitoring.  

## 2021-08-27 NOTE — Procedures (Signed)
Patient Name: Tuan Tippin  MRN: [id number]  Epilepsy Attending: Lora Havens  Referring Physician/Provider: Lorenza Chick Date: 08/27/2021 Duration: 25 mins  Patient history: 71yo M with episodes of LOC. EEG to evaluate for seizure  Level of alertness: Awake  AEDs during EEG study: None  Technical aspects: This EEG study was done with scalp electrodes positioned according to the 10-20 International system of electrode placement. Electrical activity was acquired at a sampling rate of '500Hz'$  and reviewed with a high frequency filter of '70Hz'$  and a low frequency filter of '1Hz'$ . EEG data were recorded continuously and digitally stored.   Description: The posterior dominant rhythm consists of 9-10 Hz activity of moderate voltage (25-35 uV) seen predominantly in posterior head regions, symmetric and reactive to eye opening and eye closing. Hyperventilation and photic stimulation were not performed.     IMPRESSION: This study is within normal limits. No seizures or epileptiform discharges were seen throughout the recording.  Gaylon Bentz Barbra Sarks

## 2021-08-27 NOTE — Consult Note (Signed)
Neurology Consultation Reason for Consult: Syncope versus seizure Requesting Physician: Gerlene Fee  CC: Loss of consciousness  History is obtained from: Wife at bedside, chart review and patient  HPI: Robert Lang is a 71 y.o. male with a past medical history significant for atrial fibrillation/flutter s/p ablation on Xarelto, hypertension, hyperlipidemia, obstructive sleep apnea on CPAP, coronary artery disease is s/p CABG (2020, and prior stents), lumbar laminectomy, prior tobacco use, idiopathic pulmonary fibrosis.  His wife developed COVID-19 symptoms on Saturday and subsequently the patient developed symptoms as well and they both tested positive on Monday, 08/17/2021.  He was treated with Paxlovid which she completed last dose of on Sunday.  Shortly after starting the Paxlovid on Thursday he had a syncopal event at home.  Subsequently shortly after shopping while in the car he had a syncopal episode in the car while he was belted in.  His wife describes that his eyes rolled back and he was shaking all over, which wife states looked like a seizure to her.  He continued to go in and out of consciousness for some time and was admitted to an [facility] where negative work-up included labs negative for AKI, hypokalemia or leukocytosis, MRI brain, CTA negative for PE and negative orthostasis.  He continued to feel generally weak and presented to [facility] ED for further evaluation.  While in triage he had another syncopal event and neurology was consulted.  Wife notes that postevent he was confused for less than a minute for the initial event and perhaps a slightly longer for the 2 subsequent events although it is difficult to quantify because she felt he was continuing to go in and out of consciousness.  No events were captured while patient was on telemetry.  Wife does note that the events seem to be triggered by the patient experiencing stressors.  There has been no incontinence or  tongue bites, and patient feels the events coming on as a feeling of lightheadedness or dizziness.  He reports just prior to the event in the waiting room he could smell a strong alcohol smell from another patient behind him, but otherwise denies an aura of smell for these events.  ED provider reports with the event witnessed in triage there was some concern for left leg shaking.  Additionally patient reports that he did not sleep well at the hospital the night prior to his presentation here, due to his [facility] room being near a helicopter pad with multiple flights in and out.  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 M75.00)   Allergic rhinitis due to pollen 09/03/2013   J30.1)  Formatting of this note might be different from the original. J30.1)   Allergy    Anticoagulated 07/01/2016   Anxiety    Arthritis    Atrial fibrillation ([place])    Atrial flutter ([place]) 06/26/2015   CAD (coronary artery disease)    Chest pain 06/26/2015   COPD (chronic obstructive pulmonary disease) (  [place])    Coronary artery disease 07/06/2018   Cardiac catheterization 2017 showing 90% small diagonal branch disease   DDD (degenerative disc disease), cervical 09/03/2013   M50.90)  Formatting of this note might be different from the original. M50.90)   Depression    Depression    Depressive disorder 09/03/2013   Dizziness 10/28/2017   Dyslipidemia, goal LDL below 70 07/06/2018   Dyspnea on exertion 10/20/2018    Esophageal reflux 09/03/2013   Essential hypertension 07/06/2018   ETOH abuse 01/11/2019   6 pack of beer per day   Falls 10/28/2017   GERD (gastroesophageal reflux disease)    H/O amiodarone therapy 07/01/2016   Heart attack ([place])    Heart palpitations 01/27/2017   History of colon polyps    Hyperlipidemia    Hypertension    IPF (idiopathic pulmonary fibrosis) ([place])    Kidney stones    Neck pain 09/03/2013   Neuropathy 07/06/2018   Obstructive sleep apnea 08/05/2015   PLMD (periodic limb movement disorder) 11/04/2015   Polycythemia, 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 STORY: Due to alcohol/ tobacco   Pure hypercholesterolemia 09/03/2013   E78.0)  Formatting of this note might be different from the original. E78.0)   Screening for prostate cancer 09/03/2013   Smoker 01/11/2019   Smoking 07/06/2018   Status post ablation of atrial flutter 07/06/2018   2017   Past Surgical History:  Procedure Laterality Date   ATRIAL FIBRILLATION ABLATION  10/2015   BACK SURGERY     CARDIOVERSION  2017   CATARACT EXTRACTION Bilateral 2017   March and April 2017   COLONOSCOPY  2018   CORONARY STENT PLACEMENT  2012   CORONARY/GRAFT ACUTE MI REVASCULARIZATION N/A 01/10/2019   Procedure: CORONARY/GRAFT ACUTE MI REVASCULARIZATION;  Surgeon: Leonie Man, MD;  Location: [facility];  Service: Cardiovascular;  Laterality: N/A;   INGUINAL HERNIA REPAIR     over 20 years ago   LAPAROSCOPIC APPENDECTOMY N/A 01/17/2021   Procedure: APPENDECTOMY LAPAROSCOPIC;  Surgeon: Felicie Morn, MD;  Location: [facility];  Service: General;  Laterality: N/A;   LEFT HEART CATH AND CORONARY ANGIOGRAPHY N/A 01/10/2019   Procedure: LEFT HEART CATH AND CORONARY ANGIOGRAPHY;  Surgeon: Leonie Man, MD;  Location: [facility];  Service: Cardiovascular;  Laterality: N/A;   LUMBAR LAMINECTOMY Bilateral 04/05/2016    L2-L5    VENTRAL HERNIA REPAIR  2018   Current Outpatient Medications  Medication Instructions   acetaminophen (TYLENOL) 650 mg, Oral, Every 6 hours PRN   atorvastatin (LIPITOR) 80 mg, Oral, Daily at bedtime   buPROPion (WELLBUTRIN XL) 300 mg, Oral, Daily   busPIRone (BUSPAR) 15 mg, Oral, 2 times daily   Cholecalciferol 1,000 Units, Oral, Daily   docusate sodium (COLACE) 100 mg, Oral, Daily   enalapril (VASOTEC) 2.5 MG tablet TAKE 1 TABLET(2.5 MG) BY MOUTH DAILY   famotidine (PEPCID) 20 mg, Oral, Daily at bedtime   famotidine (PEPCID) 40 mg, Oral, Daily at bedtime   fexofenadine (ALLEGRA) 180 mg, Oral, Daily at bedtime   finasteride (PROSCAR) 5 mg, Oral, Daily   fluticasone (FLONASE) 50 MCG/ACT nasal spray 1 spray, Each Nare, Daily PRN   gabapentin (NEURONTIN) 100 MG capsule TAKE 4 CAPSULES BY MOUTH AT BEDTIME   Ginger Root 550 mg, Oral, Daily   melatonin 10 mg, Oral, Daily at bedtime   Multiple Vitamins-Minerals (PRESERVISION AREDS PO) 1 capsule,  Oral, 2 times daily   nitroGLYCERIN (NITROSTAT) 0.4 mg, Sublingual, Every 5 min PRN   NON FORMULARY CPAP at bedtime   ondansetron (ZOFRAN) 4 MG tablet TAKE 1 TABLET(4 MG) BY MOUTH EVERY 8 HOURS AS NEEDED FOR NAUSEA OR VOMITING   pantoprazole (PROTONIX) 40 mg, Oral, Daily   Pirfenidone (ESBRIET) 801 mg, Oral, 3 times daily   Polyethylene Glycol 3350 (MIRALAX PO) 17 g, Oral, Daily   ranolazine (RANEXA) 1,000 mg, Oral, 2 times daily   rivaroxaban (XARELTO) 20 MG TABS tablet TAKE 1 TABLET(20 MG) BY MOUTH DAILY WITH SUPPER   sertraline (ZOLOFT) 100 mg, Oral, Daily at bedtime   silodosin (RAPAFLO) 8 mg, Oral, Daily at bedtime   Sodium Sulfate-Mag Sulfate-KCl (SUTAB) [phone] MG TABS 1 kit, Oral, As directed   sucralfate (CARAFATE) 1 g, Oral, 3 times daily with meals & bedtime   traZODone (DESYREL) 25-50 mg, Oral, At bedtime PRN   vitamin B-12 (CYANOCOBALAMIN) 1,000 mcg, Oral, Daily    Current Outpatient Medications  Medication  Instructions   acetaminophen (TYLENOL) 650 mg, Oral, Every 6 hours PRN   atorvastatin (LIPITOR) 80 mg, Oral, Daily at bedtime   buPROPion (WELLBUTRIN XL) 300 mg, Oral, Daily   busPIRone (BUSPAR) 15 mg, Oral, 2 times daily   Cholecalciferol 1,000 Units, Oral, Daily   docusate sodium (COLACE) 100 mg, Oral, Daily   enalapril (VASOTEC) 2.5 MG tablet TAKE 1 TABLET(2.5 MG) BY MOUTH DAILY   famotidine (PEPCID) 20 mg, Oral, Daily at bedtime   famotidine (PEPCID) 40 mg, Oral, Daily at bedtime   fexofenadine (ALLEGRA) 180 mg, Oral, Daily at bedtime   finasteride (PROSCAR) 5 mg, Oral, Daily   fluticasone (FLONASE) 50 MCG/ACT nasal spray 1 spray, Each Nare, Daily PRN   gabapentin (NEURONTIN) 100 MG capsule TAKE 4 CAPSULES BY MOUTH AT BEDTIME   Ginger Root 550 mg, Oral, Daily   melatonin 10 mg, Oral, Daily at bedtime   Multiple Vitamins-Minerals (PRESERVISION AREDS PO) 1 capsule, Oral, 2 times daily   nitroGLYCERIN (NITROSTAT) 0.4 mg, Sublingual, Every 5 min PRN   NON FORMULARY CPAP at bedtime   ondansetron (ZOFRAN) 4 MG tablet TAKE 1 TABLET(4 MG) BY MOUTH EVERY 8 HOURS AS NEEDED FOR NAUSEA OR VOMITING   pantoprazole (PROTONIX) 40 mg, Oral, Daily   Pirfenidone (ESBRIET) 801 mg, Oral, 3 times daily   Polyethylene Glycol 3350 (MIRALAX PO) 17 g, Oral, Daily   ranolazine (RANEXA) 1,000 mg, Oral, 2 times daily   rivaroxaban (XARELTO) 20 MG TABS tablet TAKE 1 TABLET(20 MG) BY MOUTH DAILY WITH SUPPER   sertraline (ZOLOFT) 100 mg, Oral, Daily at bedtime   silodosin (RAPAFLO) 8 mg, Oral, Daily at bedtime   Sodium Sulfate-Mag Sulfate-KCl (SUTAB) [phone] MG TABS 1 kit, Oral, As directed   sucralfate (CARAFATE) 1 g, Oral, 3 times daily with meals & bedtime   traZODone (DESYREL) 25-50 mg, Oral, At bedtime PRN   vitamin B-12 (CYANOCOBALAMIN) 1,000 mcg, Oral, Daily    Family History  Problem Relation Age of Onset   Colon cancer Father    Throat cancer Brother    Social History:  reports that he quit  smoking about 2 years ago. His smoking use included cigarettes. He has a 30.00 pack-year smoking history. He has quit using smokeless tobacco. He reports current alcohol use. He reports that he does not use drugs.   Exam: Current vital signs: BP 115/68   Pulse 83   Temp 98.6 F (37 C) (Oral)   Resp 15  SpO2 96%  Vital signs in last 24 hours: Temp:  [98.6 F (37 C)] 98.6 F (37 C) (06/07 2107) Pulse Rate:  [83-93] 83 (06/08 0245) Resp:  [14-28] 15 (06/08 0245) BP: (115-152)/(68-86) 115/68 (06/08 0245) SpO2:  [93 %-100 %] 96 % (06/08 0245)   Physical Exam  Constitutional: Appears well-developed and well-nourished.  Psych: Affect mildly frustrated but cooperative.  During my evaluation begins to have some hallucinations of bugs on the wall Eyes: No scleral injection HENT: No oropharyngeal obstruction.  MSK: no joint deformities.  Cardiovascular: Irregularly irregular, perfusing extremities well Respiratory: Short of breath at rest and speech limited by shortness of breath GI: Soft.  No distension. There is no tenderness.  Skin: Warm dry and intact visible skin  Neuro: Mental Status: Patient is awake, alert, oriented to person, place, month, year, and situation. Patient is able to give some history but is limited by shortness of breath and at times is unclear on the timeline which is corrected by his wife No signs of aphasia or neglect Cranial Nerves: II: Visual Fields are full. Pupils are equal, round, and reactive to light.   III,IV, VI: EOMI without some slight lateral diplopia which he attributes to dry eyes V: Facial sensation is symmetric to light touch except on V1 on the right which he reports is a little rougher than left VII: Facial movement is notable for mild right nasolabial fold flattening at rest which nearly resolved with smile VIII: hearing is intact to voice X: Uvula elevates symmetrically XI: Shoulder shrug is symmetric. XII: tongue is midline without  atrophy or fasciculations.  Motor: Tone is normal. Bulk is normal. 5/5 strength was present in all four extremities, except for pain limited hip flexion bilaterally, right worse than left Sensory: Sensation is symmetric to light touch and temperature in the arms and leg, with a length dependent neuropathy worse on the left leg than the right Deep Tendon Reflexes: 3+ and symmetric in the brachioradialis  Plantars: Toes are mute bilaterally.  Cerebellar: Finger-nose is intact bilaterally.  Heel-to-shin is clumsy with the left foot compared to the right which he attributes to pain in the leg Gait:  Deferred   I have reviewed labs in epic and the results pertinent to this consultation are:  Basic Metabolic Panel: Recent Labs  Lab 08/26/21 1826 08/26/21 2340  NA 136  --   K 4.3  --   CL 105  --   CO2 20*  --   GLUCOSE 86  --   BUN 8  --   CREATININE 0.77  --   CALCIUM 9.0  --   MG  --  2.2    CBC: Recent Labs  Lab 08/26/21 1826  WBC 7.0  NEUTROABS 4.4  HGB 15.2  HCT 46.8  MCV 99.8  PLT 295    Coagulation Studies: No results for input(s): "LABPROT", "INR" in the last 72 hours.    I have reviewed the images obtained:  Head CT personally reviewed, agree with radiology no evidence of acute intracranial process Chest x-ray today unremarkable, CT chest 07/06/2021: 1. Pulmonary parenchymal pattern of basilar interstitial lung disease is unchanged from 05/08/2020 and may be due to nonspecific interstitial pneumonitis or usual interstitial pneumonitis. Findings are categorized as probable UIP per consensus guidelines: Diagnosis of Idiopathic Pulmonary Fibrosis: An Official ATS/ERS/JRS/ALAT Clinical Practice Guideline. [place], Iss 5, [phone], Nov 20 2016. 2. Aortic atherosclerosis (ICD10-I70.0). Coronary artery calcification. 3.  Emphysema (ICD10-J43.9).  MRI brain personally reviewed through [facility]\ Agree with radiology  that there is no acute intracranial process but there is some evidence of chronic microvascular disease  Impression: Events as described sound more consistent with post syncopal convulsion.  However cannot exclude seizure.  Given he has had daily events the past 2 days and cardiopulmonary work-up has been unrevealing, I do think it is reasonable to try to capture a spell while he is here.  Please note the patient does appear to have some delirium as well  Recommendations: -Delirium precautions -Telemetry monitoring -Routine EEG followed by cEEG -Neurology will follow   Lesleigh Noe MD-PhD [facility] [phone] Available 7 PM to 7 AM, outside of these hours please call Neurologist on call as listed on Amion.

## 2021-08-27 NOTE — H&P (Addendum)
History and Physical    Patient: Robert Lang BRA:[id number] DOB: June 28, 1950 DOA: 08/26/2021 DOS: the patient was seen and examined on 08/27/2021 PCP: Mackie Pai, [name]  Patient coming from: Home via EMS  Chief Complaint:  Chief Complaint  Patient presents with   Weakness   HPI: Robert Lang is a 71 y.o. male with medical history significant of hypertension, hyperlipidemia, atrial fibrillation on anticoagulation, COPD, idiopathic pulmonary fibrosis, periodic limb movement disorder, anxiety, and OSA on CPAP who presents with c/o of weakness and syncope.  Patient had come down with Covid-19 on the 5/28 and test positive the following day. He was started on Plaxovid which he completed on 6/4. Normally he is not on oxygen at baseline and is followed by Dr. Chase Caller in the outpatient setting.  The patient had fallen 1 week ago while at home was found on the ground.  He had complained of some dizziness but denied any chest pain complaints.  His wife was able to get him off the floor at that time and reportedly went to [facility] for evaluation, but appears symptoms got better and he was not seen before leaving.  Since that time patient had been complaining of weakness and fell 2 days ago.  Later on that day and his wife had been out doing errands and after getting back in the car with complained of feeling weak and dizzy before passing out. Patient had been getting around with use of motorized wheelchair at that time.  He was hospitalized at [facility] from 6/6-6/7 for syncope with no clear etiology appreciated.  Patient underwent CT angiogram which did not show any signs of a pulmonary embolus.  Orthostatic vital signs were negative.  MRI of the brain did not note any acute abnormality.  Ultimately it was thought that Paxlovid may have been a contributing factor to patient's syncopal events.  Patient notes that he has been dizziness feels like the room is spinning around him in addition to him  feeling like he is going to pass out.  He has had associated symptoms of intermittent mild mostly nonproductive cough, nausea, vomiting, insomnia for the last 3 days, and reports of hallucinations.  His wife does report at baseline patient is anxious.  Yesterday afternoon after he had been discharged from the hospital had complained of feeling as though the room was spinning around him before passing out.  While in the emergency room patient had an episode where he felt dizzy prior to passing out.  Upon admission into the emergency department patient was noted to be afebrile with respirations 12-28, and all other vital signs relatively maintained.  Labs from yesterday were relatively unremarkable except for lactic acid of 2.7.  CT scan of the head did not note any acute abnormality. Blood cultures have been obtained.  While in the ED patient had 1 such event where patient had focal shaking of the left leg staring into space with no blink to direct threat.  Neurology have been formally consulted and recommended routine and continuous EEG monitoring in efforts to capture an event.  Patient has been given 1 L of normal saline IV fluids.   Review of Systems: As mentioned in the history of present illness. All other systems reviewed and are negative. 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 M75.00)  Allergic rhinitis due to pollen 09/03/2013   J30.1)  Formatting of this note might be different from the original. J30.1)   Allergy    Anticoagulated 07/01/2016   Anxiety    Arthritis    Atrial fibrillation ([place])    Atrial flutter ([place]) 06/26/2015   CAD (coronary artery disease)    Chest pain 06/26/2015   COPD (chronic obstructive pulmonary disease) ([place])    Coronary artery disease 07/06/2018    Cardiac catheterization 2017 showing 90% small diagonal branch disease   DDD (degenerative disc disease), cervical 09/03/2013   M50.90)  Formatting of this note might be different from the original. M50.90)   Depression    Depression    Depressive disorder 09/03/2013   Dizziness 10/28/2017   Dyslipidemia, goal LDL below 70 07/06/2018   Dyspnea on exertion 10/20/2018   Esophageal reflux 09/03/2013   Essential hypertension 07/06/2018   ETOH abuse 01/11/2019   6 pack of beer per day   Falls 10/28/2017   GERD (gastroesophageal reflux disease)    H/O amiodarone therapy 07/01/2016   Heart attack ([place])    Heart palpitations 01/27/2017   History of colon polyps    Hyperlipidemia    Hypertension    IPF (idiopathic pulmonary fibrosis) ([place])    Kidney stones    Neck pain 09/03/2013   Neuropathy 07/06/2018   Obstructive sleep apnea 08/05/2015   PLMD (periodic limb movement disorder) 11/04/2015   Polycythemia, 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 STORY: Due to alcohol/ tobacco   Pure hypercholesterolemia 09/03/2013   E78.0)  Formatting of this note might be different from the original. E78.0)   Screening for prostate cancer 09/03/2013   Smoker 01/11/2019   Smoking 07/06/2018   Status post ablation of atrial flutter 07/06/2018   2017   Past Surgical History:  Procedure Laterality Date   ATRIAL FIBRILLATION ABLATION  10/2015   BACK SURGERY     CARDIOVERSION  2017   CATARACT EXTRACTION Bilateral 2017   March and April 2017   COLONOSCOPY  2018   CORONARY STENT PLACEMENT  2012   CORONARY/GRAFT ACUTE MI REVASCULARIZATION N/A 01/10/2019   Procedure: CORONARY/GRAFT ACUTE MI REVASCULARIZATION;  Surgeon: Leonie Man, MD;  Location: [facility];  Service: Cardiovascular;  Laterality: N/A;   INGUINAL HERNIA REPAIR     over 20 years ago   LAPAROSCOPIC APPENDECTOMY N/A 01/17/2021   Procedure:  APPENDECTOMY LAPAROSCOPIC;  Surgeon: Felicie Morn, MD;  Location: [facility];  Service: General;  Laterality: N/A;   LEFT HEART CATH AND CORONARY ANGIOGRAPHY N/A 01/10/2019   Procedure: LEFT HEART CATH AND CORONARY ANGIOGRAPHY;  Surgeon: Leonie Man, MD;  Location: [facility];  Service: Cardiovascular;  Laterality: N/A;   LUMBAR LAMINECTOMY Bilateral 04/05/2016   L2-L5    VENTRAL HERNIA REPAIR  2018   Social History:  reports that he quit smoking about 2 years ago. His smoking use included cigarettes. He has a 30.00 pack-year smoking history. He has quit using smokeless tobacco. He reports current alcohol use. He reports that he does not use drugs.  Allergies  Allergen Reactions   Naproxen Other (See Comments)    (Naprosyn *ANALGESICS - ANTI-INFLAMMATORY*) Nausea, Abdominal pain    Family History  Problem Relation Age of Onset   Colon cancer Father    Throat cancer Brother     Prior to Admission medications   Medication Sig Start Date  End Date Taking? Authorizing Provider  acetaminophen (TYLENOL) 325 MG tablet Take 2 tablets (650 mg total) by mouth every 6 (six) hours as needed. Patient taking differently: Take 650 mg by mouth every 6 (six) hours as needed for mild pain or moderate pain. 01/19/21  Yes Simaan, Darci Current, [name]  atorvastatin (LIPITOR) 80 MG tablet Take 1 tablet (80 mg total) by mouth at bedtime. 03/10/21  Yes Park Liter, MD  buPROPion (WELLBUTRIN XL) 300 MG 24 hr tablet Take 1 tablet (300 mg total) by mouth daily. 07/09/19  Yes [name], Percell Miller, [name]  busPIRone (BUSPAR) 15 MG tablet Take 15 mg by mouth 2 (two) times daily. 06/02/21  Yes [provider]  Cholecalciferol 25 MCG (1000 UT) tablet Take 1,000 Units by mouth daily.    Yes [provider]  docusate sodium (COLACE) 100 MG capsule Take 100 mg by mouth daily.   Yes [provider]  famotidine (PEPCID) 40 MG tablet Take 40 mg by mouth at bedtime. 08/18/21  Yes  [provider]  fexofenadine (ALLEGRA) 180 MG tablet Take 180 mg by mouth at bedtime.  04/09/08  Yes [provider]  finasteride (PROSCAR) 5 MG tablet Take 1 tablet (5 mg total) by mouth daily. Patient taking differently: Take 5 mg by mouth at bedtime. 03/10/21  Yes Park Liter, MD  fluticasone (FLONASE) 50 MCG/ACT nasal spray Place 1 spray into both nostrils daily as needed for allergies or rhinitis.   Yes [provider]  gabapentin (NEURONTIN) 100 MG capsule TAKE 4 CAPSULES BY MOUTH AT BEDTIME Patient taking differently: Take 100 mg by mouth at bedtime. TAKE 4 CAPSULES BY MOUTH AT BEDTIME 07/08/21  Yes [name], Percell Miller, [name]  Ginger, Zingiber officinalis, (GINGER ROOT) 550 MG CAPS Take 550 mg by mouth daily.   Yes [provider]  melatonin 5 MG TABS Take 10 mg by mouth at bedtime.   Yes [provider]  Multiple Vitamins-Minerals (PRESERVISION AREDS PO) Take 1 capsule by mouth in the morning and at bedtime.   Yes [provider]  nitroGLYCERIN (NITROSTAT) 0.4 MG SL tablet Place 1 tablet (0.4 mg total) under the tongue every 5 (five) minutes as needed for chest pain. 03/10/21  Yes Park Liter, MD  NON FORMULARY CPAP at bedtime   Yes [provider]  ondansetron (ZOFRAN) 4 MG tablet TAKE 1 TABLET(4 MG) BY MOUTH EVERY 8 HOURS AS NEEDED FOR NAUSEA OR VOMITING Patient taking differently: Take 4 mg by mouth every 8 (eight) hours as needed for vomiting or nausea. 05/25/21  Yes [name], Percell Miller, [name]  pantoprazole (PROTONIX) 40 MG tablet Take 1 tablet (40 mg total) by mouth daily. 07/09/21  Yes Jackquline Denmark, MD  Pirfenidone (ESBRIET) 801 MG TABS Take 801 mg by mouth 3 (three) times daily. 06/15/21  Yes Brand Males, MD  Polyethylene Glycol 3350 (MIRALAX PO) Take 17 g by mouth daily.   Yes [provider]  ranolazine (RANEXA) 1000 MG SR tablet Take 1 tablet (1,000 mg total) by mouth 2 (two) times daily. 03/10/21  Yes  Park Liter, MD  rivaroxaban (XARELTO) 20 MG TABS tablet TAKE 1 TABLET(20 MG) BY MOUTH DAILY WITH SUPPER Patient taking differently: Take 20 mg by mouth daily with supper. 04/21/21  Yes Park Liter, MD  sertraline (ZOLOFT) 100 MG tablet Take 100 mg by mouth at bedtime.   Yes [provider]  silodosin (RAPAFLO) 8 MG CAPS capsule Take 8 mg by mouth at bedtime. 08/12/21  Yes [provider]  traZODone (DESYREL) 50 MG tablet Take 0.5-1 tablets (25-50 mg total) by mouth at bedtime as needed for sleep. Patient taking differently: Take 50 mg by mouth at bedtime. 04/20/21  Yes [name], Percell Miller, [name]  vitamin B-12 (CYANOCOBALAMIN) 1000 MCG tablet Take 1,000 mcg by mouth daily.   Yes [provider]  enalapril (VASOTEC) 2.5 MG tablet TAKE 1 TABLET(2.5 MG) BY MOUTH DAILY Patient not taking: Reported on 08/27/2021 05/01/21   Park Liter, MD  famotidine (PEPCID) 20 MG tablet Take 1 tablet (20 mg total) by mouth at bedtime. Patient not taking: Reported on 08/27/2021 07/09/21   Jackquline Denmark, MD  Sodium Sulfate-Mag Sulfate-KCl (SUTAB) [phone] MG TABS Take 1 kit by mouth as directed. Patient not taking: Reported on 08/27/2021 06/01/21   Jackquline Denmark, MD  sucralfate (CARAFATE) 1 g tablet Take 1 tablet (1 g total) by mouth 4 (four) times daily -  with meals and at bedtime. Patient not taking: Reported on 08/27/2021 05/14/21   Montine Circle, [name]    Physical Exam: Vitals:   08/27/21 0500 08/27/21 0515 08/27/21 0600 08/27/21 0645  BP: 124/87 133/78 (!) 147/74 (!) 149/81  Pulse: 80 81 79 78  Resp: 13 16 15 18   Temp:      TempSrc:      SpO2: 90% 92% 95% 93%   Constitutional: Elderly male who appears to be in acute distress Eyes: PERRL, lids and conjunctivae normal ENMT: Mucous membranes are dry.   Neck: normal, supple, no masses, no thyromegaly Respiratory: Intermittently tachypneic with positive inspiratory and expiratory crackles.  No significant wheezes  appreciated at this time. Cardiovascular: Regular rate and rhythm, no murmurs / rubs / gallops. No extremity edema.   Abdomen: no tenderness, no masses palpated. No hepatosplenomegaly. Bowel sounds positive.  Musculoskeletal: no clubbing / cyanosis. No joint deformity upper and lower extremities. Good ROM, no contractures. Normal muscle tone.  Skin: no rashes, lesions, ulcers. No induration Neurologic: CN 2-12 grossly intact. Sensation intact, DTR normal. Strength 5/5 in all 4.  Psychiatric: Poor memory.  Alert and oriented to person and place.  Hallucinating complaining of seeing bugs on the walls.  Data Reviewed:  EKG reveals sinus rhythm at 80 bpm with first-degree AV heart block bifascicular block  Assessment and Plan: Acute metabolic encephalopathy Hallucinations Recurrent syncope Patient had been recently worked up for syncopal episodes and weakness.  While in the ED patient had an event where he was staring off with left leg shaking.  Family notes he has not been back to his baseline.  Question possibility of seizure-like activity.  Patient has been evaluated by neurology who thought symptoms were more likely secondary to convulsive syncope.  Previous work-up done 2 days ago noted negative MRI of the brain, negative CT angiogram of the chest for pulmonary embolus.   -Admit to telemetry bed  -Delirium and seizure precautions -Neurochecks -Follow-up EEG monitoring -Normal saline IV fluids at 75 mL/h -PT/OT to evaluate and treat -Follow-up telemetry.  Consider discussing with cardiology in a.m. and consider possible need of Holter monitor -Appreciate neurology consultative services, will follow-up for any further recommendation  Lactic acidosis Acute.  Initial lactic acid was 2.7.  Possibly related to some dehydration from recent viral illness.  Urinalysis and chest x-ray did not note any concern for infection. -Follow-up blood cultures -Continue IV fluid -Continue to trend lactic  acid level  Bifascicular block Noted on initial EKG.  Vital signs currently appear to be stable.  Unclear if  this is a factor in patient's symptoms.  Discussed with on-call cardiologist who recommended just monitoring on telemetry overnight. -Follow-up telemetry overnight -Consider formal consult to cardiology if abnormality noted on telemetry  Falls at home Thought to be secondary to the patient passing out.  CT imaging of the brain did not note any acute abnormality.   -PT/OT to eval and treat  CAD s/p PCI Patient with prior history of STEMI in 12/2018 requiring PCI and drug-eluting stent to the RCA.  High-sensitivity troponins negative x2.  History of COVID-19 Initially diagnosed on 5/29.  Patient had completed a course of Plaxovid 4 days ago.  His x-ray was otherwise clear. -Mucinex as needed  Idiopathic pulmonary fibrosis COPD Patient is followed by Dr. Chase Caller in outpatient setting.  O2 saturations currently maintained on room air.  Crackles, but no significant wheezes noted on physical exam. -Continue nasal cannula oxygen as needed -Breathing treatments as needed  Atrial flutter s/p ablation in 2070 chronic anticoagulation -Continue Xarelto  Anxiety Patient was noted to be significant anxious and hyper plan relating at times while in the ED. -Ativan IV as needed for anxiety  Hyperlipidemia -Continue atorvastatin  BPH -Continue Proscar and pharmacy substitution of Flomax for silodosin  GERD -Continue Pepcid and Protonix  OSA on CPAP -Continue CPAP nightly  Advance Care Planning:   Code Status: Full Code   Consults: None  Family Communication: Wife updated over the phone  Severity of Illness: The appropriate patient status for this patient is OBSERVATION. Observation status is judged to be reasonable and necessary in order to provide the required intensity of service to ensure the patient's safety. The patient's presenting symptoms, physical exam findings, and  initial radiographic and laboratory data in the context of their medical condition is felt to place them at decreased risk for further clinical deterioration. Furthermore, it is anticipated that the patient will be medically stable for discharge from the hospital within 2 midnights of admission.   Author: Norval Morton, MD 08/27/2021 7:40 AM  For on call review [email or web address].

## 2021-08-27 NOTE — Progress Notes (Signed)
EEG complete - results pending 

## 2021-08-27 NOTE — ED Notes (Signed)
Eyal Greenhaw wife [phone] requesting an update on the patient

## 2021-08-28 ENCOUNTER — Observation Stay (HOSPITAL_BASED_OUTPATIENT_CLINIC_OR_DEPARTMENT_OTHER): Payer: Medicare Other

## 2021-08-28 DIAGNOSIS — I428 Other cardiomyopathies: Secondary | ICD-10-CM | POA: Diagnosis not present

## 2021-08-28 DIAGNOSIS — R55 Syncope and collapse: Secondary | ICD-10-CM | POA: Diagnosis not present

## 2021-08-28 LAB — CBC
HCT: 42.1 % (ref 39.0–52.0)
Hemoglobin: 13.9 g/dL (ref 13.0–17.0)
MCH: 31.9 pg (ref 26.0–34.0)
MCHC: 33 g/dL (ref 30.0–36.0)
MCV: 96.6 fL (ref 80.0–100.0)
Platelets: 283 10*3/uL (ref 150–400)
RBC: 4.36 MIL/uL (ref 4.22–5.81)
RDW: 12.6 % (ref 11.5–15.5)
WBC: 6.9 10*3/uL (ref 4.0–10.5)
nRBC: 0 % (ref 0.0–0.2)

## 2021-08-28 LAB — BASIC METABOLIC PANEL
Anion gap: 5 (ref 5–15)
BUN: 5 mg/dL — ABNORMAL LOW (ref 8–23)
CO2: 22 mmol/L (ref 22–32)
Calcium: 8.7 mg/dL — ABNORMAL LOW (ref 8.9–10.3)
Chloride: 104 mmol/L (ref 98–111)
Creatinine, Ser: 0.71 mg/dL (ref 0.61–1.24)
GFR, Estimated: >60 mL/min (ref >60)
Glucose, Bld: 108 mg/dL — ABNORMAL HIGH (ref 70–99)
Potassium: 3.6 mmol/L (ref 3.5–5.1)
Sodium: 131 mmol/L — ABNORMAL LOW (ref 135–145)

## 2021-08-28 LAB — ECHOCARDIOGRAM COMPLETE
AR max vel: 3.39 cm2
AV Area VTI: 3.39 cm2
AV Area mean vel: 3.38 cm2
AV Mean grad: 2 mmHg
AV Peak grad: 4 mmHg
Ao pk vel: 1 m/s
S' Lateral: 3.3 cm

## 2021-08-28 MED ORDER — PERFLUTREN LIPID MICROSPHERE
1.0000 mL | INTRAVENOUS | Status: AC | PRN
  Administered 2021-08-28: 4 mL via INTRAVENOUS

## 2021-08-28 NOTE — Discharge Summary (Addendum)
Robert Lang MGQ:[id number] DOB: 02-Nov-1950 DOA: 08/26/2021  PCP: Mackie Pai, [name]  Admit date: 08/26/2021  Discharge date: 08/28/2021  Admitted From: Home   Disposition:  Home   Recommendations for Outpatient Follow-up:   Follow up with PCP in 1-2 weeks  PCP Please obtain BMP/CBC, 2 view CXR in 1week,  (see Discharge instructions)   PCP Please follow up on the following pending results: Monitor BMP closely, if sodium persists to be low consider SIADH due to Zoloft.  Needs outpatient Cards follow up in 1 week and neurology follow-up in 14-21 days.  If weakness continues then outpatient 30-day event monitor to be arranged by PCP.   Home Health: Home health PT. Equipment/Devices: Walker Consultations: Neuro Discharge Condition: Stable    CODE STATUS: Full    Diet Recommendation: Heart Healthy     Chief Complaint  Patient presents with   Weakness     Brief history of present illness from the day of admission and additional interim summary    71 y.o. male with medical history significant of hypertension, hyperlipidemia, atrial fibrillation on anticoagulation, COPD, idiopathic pulmonary fibrosis, periodic limb movement disorder, anxiety, and OSA on CPAP who presents with c/o of weakness and syncope.  Patient had come down with Covid-19 on the 5/28 and test positive the following day. He was started on Plaxovid which he completed on 6/4. Normally he is not on oxygen at baseline and is followed by Dr. Chase Caller in the outpatient setting.  The patient had fallen 1 week ago while at home was found on the ground.  He had complained of some dizziness but denied any chest pain complaints.  His wife was able to get him off the floor at that time and reportedly went to [facility] for evaluation, but appears symptoms got better  and he was not seen before leaving.  Since that time patient had been complaining of weakness and fell 2 days ago.  Later on that day and his wife had been out doing errands and after getting back in the car with complained of feeling weak and dizzy before passing out. Patient had been getting around with use of motorized wheelchair at that time.    He was hospitalized at [facility] from 6/6-6/7 for syncope with no clear etiology appreciated.  Patient underwent CT angiogram which did not show any signs of a pulmonary embolus.  Orthostatic vital signs were negative.  MRI of the brain did not note any acute abnormality.  Ultimately it was thought that Paxlovid may have been a contributing factor to patient's syncopal events.  Patient notes that he has been dizziness feels like the room is spinning around him in addition to him feeling like he is going to pass out.  He has had associated symptoms of intermittent mild mostly nonproductive cough, nausea, vomiting, insomnia for the last 3 days, and reports of hallucinations.  His wife does report at baseline patient is anxious.  Yesterday afternoon after he had been discharged from the hospital had complained of feeling as though  the room was spinning around him before passing out.  While in the emergency room patient had an episode where he felt dizzy prior to passing out                                                                Hospital Course    Acute metabolic encephalopathy - Recurrent syncope question due to dehydration recent COVID-19 infection in elderly patient -  Patient had been recently worked up for syncopal episodes and weakness.  While in the ED patient had an event where he was staring off with left leg shaking.  Family notes he has not been back to his baseline.  Question possibility of seizure-like activity.  Patient has been evaluated by neurology who thought symptoms were more likely secondary to convulsive syncope. Previous work-up done  2 days ago noted negative MRI of the brain, negative CT angiogram of the chest for pulmonary embolus.  He was kept here on telemetry bed with IV fluids, much improved after hydration, EEG long-term unremarkable, he is now back to baseline and eager to go home.  Per neurology outpatient follow-up with [facility], do not drive till cleared by neurologist.  Do not think clinically that he had seizure but think that most of his symptoms are due to dehydration caused by recent COVID-19 infection.  He feels much better after IV fluids and will be discharged home with home health PT with outpatient PCP follow-up.  If symptoms recur please consider outpatient 30-day Holter monitor.    Plan discussed with wife [name] agrees with the plan.     Lactic acidosis due to dehydration.  Much improved after IV fluids, no sepsis.    Bifascicular block  Noted on initial EKG.  Vital signs currently appear to be stable.  Unclear if this is a factor in patient's symptoms.  Discussed with on-call cardiologist who recommended just monitoring on telemetry overnight.  Stable overnight telemetry monitoring, patient follow-up with his cardiologist within a week.    Falls at home -[place] due to dehydration as above, much improved with IV fluids, close to his baseline,.  CT imaging of the brain did not note any acute abnormality.  Home PT and walker.    CAD s/p PCI - Patient with prior history of STEMI in 12/2018 requiring PCI and drug-eluting stent to the RCA.  High-sensitivity troponins negative x2.   History of COVID-19 - Initially diagnosed on 5/29.  He has completed outpatient Paxlovid treatment, I think he had minimal inflammation due to COVID-19 enough to make him dehydrated with failure to thrive.  Much better now.   Idiopathic pulmonary fibrosis COPD - Patient is followed by Dr. Chase Caller in outpatient setting.  O2 saturations currently maintained on room air.  Stable exam.  He is symptom-free on room air post  discharge will follow with his pulmonologist.   Atrial flutter s/p ablation in 2070 chronic anticoagulation  -Continue Xarelto   Anxiety  - Patient was noted to be significant anxious and hyper plan relating at times while in the ED.    Hyperlipidemia  -Continue atorvastatin   BPH  - Continue Proscar and pharmacy substitution of Flomax for silodosin   GERD  - Continue Pepcid and Protonix   OSA on CPAP  -Continue CPAP  nightly  Hyponatremia - acute on chronic clinically appears to be dehydrated and was hydrated adequately, repeat BMP by PCP, he is also on Zoloft which could cause SIADH in the long-term.  Note his sodium has chronically been low as well.  H.O AV Valve density -  being monitored by his Cardiologist, TTE repeat here stable    Discharge diagnosis     Principal Problem:   Recurrent syncope Active Problems:   Lactic acidosis   Bifascicular block   Falls   Coronary artery disease   History of COVID-19   IPF (idiopathic pulmonary fibrosis) (HCC)   Status post ablation of atrial flutter   Anticoagulated   COPD (chronic obstructive pulmonary disease) (HCC)   Obstructive sleep apnea   Acute metabolic encephalopathy    Discharge instructions    Discharge Instructions     Diet - low sodium heart healthy   Complete by: As directed    Discharge instructions   Complete by: As directed    Do not drive till you are cleared to do so by the neurologist.    Follow with Primary MD [name], Percell Miller, [name] in 7 days   Get CBC, CMP, 2 view Chest X ray -  checked next visit within 1 week by Primary MD   Activity: As tolerated with Full fall precautions use walker/cane & assistance as needed  Disposition Home   Diet: Heart Healthy    Special Instructions: If you have smoked or chewed Tobacco  in the last 2 yrs please stop smoking, stop any regular Alcohol  and or any Recreational drug use.  On your next visit with your primary care physician please Get Medicines  reviewed and adjusted.  Please request your Prim.MD to go over all Hospital Tests and Procedure/Radiological results at the follow up, please get all Hospital records sent to your Prim MD by signing hospital release before you go home.  If you experience worsening of your admission symptoms, develop shortness of breath, life threatening emergency, suicidal or homicidal thoughts you must seek medical attention immediately by calling 911 or calling your MD immediately  if symptoms less severe.  You Must read complete instructions/literature along with all the possible adverse reactions/side effects for all the Medicines you take and that have been prescribed to you. Take any new Medicines after you have completely understood and accpet all the possible adverse reactions/side effects.   Increase activity slowly   Complete by: As directed        Discharge Medications   Allergies as of 08/28/2021       Reactions   Naproxen Other (See Comments)   (Naprosyn *ANALGESICS - ANTI-INFLAMMATORY*) Nausea, Abdominal pain        Medication List     TAKE these medications    acetaminophen 325 MG tablet Commonly known as: TYLENOL Take 2 tablets (650 mg total) by mouth every 6 (six) hours as needed. What changed: reasons to take this   atorvastatin 80 MG tablet Commonly known as: LIPITOR Take 1 tablet (80 mg total) by mouth at bedtime.   buPROPion 300 MG 24 hr tablet Commonly known as: WELLBUTRIN XL Take 1 tablet (300 mg total) by mouth daily.   busPIRone 15 MG tablet Commonly known as: BUSPAR Take 15 mg by mouth 2 (two) times daily.   Cholecalciferol 25 MCG (1000 UT) tablet Take 1,000 Units by mouth daily.   docusate sodium 100 MG capsule Commonly known as: COLACE Take 100 mg by mouth daily.   famotidine  40 MG tablet Commonly known as: PEPCID Take 40 mg by mouth at bedtime.   fexofenadine 180 MG tablet Commonly known as: ALLEGRA Take 180 mg by mouth at bedtime.   finasteride  5 MG tablet Commonly known as: PROSCAR Take 1 tablet (5 mg total) by mouth daily. What changed: when to take this   fluticasone 50 MCG/ACT nasal spray Commonly known as: FLONASE Place 1 spray into both nostrils daily as needed for allergies or rhinitis.   gabapentin 100 MG capsule Commonly known as: NEURONTIN TAKE 4 CAPSULES BY MOUTH AT BEDTIME What changed: See the new instructions.   Ginger Root 550 MG Caps Take 550 mg by mouth daily.   melatonin 5 MG Tabs Take 10 mg by mouth at bedtime.   MIRALAX PO Take 17 g by mouth daily.   nitroGLYCERIN 0.4 MG SL tablet Commonly known as: NITROSTAT Place 1 tablet (0.4 mg total) under the tongue every 5 (five) minutes as needed for chest pain.   NON FORMULARY CPAP at bedtime   ondansetron 4 MG tablet Commonly known as: ZOFRAN TAKE 1 TABLET(4 MG) BY MOUTH EVERY 8 HOURS AS NEEDED FOR NAUSEA OR VOMITING What changed: See the new instructions.   pantoprazole 40 MG tablet Commonly known as: PROTONIX Take 1 tablet (40 mg total) by mouth daily.   Pirfenidone 801 MG Tabs Commonly known as: Esbriet Take 801 mg by mouth 3 (three) times daily.   PRESERVISION AREDS PO Take 1 capsule by mouth in the morning and at bedtime.   ranolazine 1000 MG SR tablet Commonly known as: RANEXA Take 1 tablet (1,000 mg total) by mouth 2 (two) times daily.   sertraline 100 MG tablet Commonly known as: ZOLOFT Take 100 mg by mouth at bedtime.   silodosin 8 MG Caps capsule Commonly known as: RAPAFLO Take 8 mg by mouth at bedtime.   traZODone 50 MG tablet Commonly known as: DESYREL Take 0.5-1 tablets (25-50 mg total) by mouth at bedtime as needed for sleep. What changed:  how much to take when to take this   vitamin B-12 1000 MCG tablet Commonly known as: CYANOCOBALAMIN Take 1,000 mcg by mouth daily.   Xarelto 20 MG Tabs tablet Generic drug: rivaroxaban TAKE 1 TABLET(20 MG) BY MOUTH DAILY WITH SUPPER What changed: See the new  instructions.               Durable Medical Equipment  (From admission, onward)           Start     Ordered   08/28/21 1204  For home use only DME Walker rolling  Once       Comments: 5 wheel  Question Answer Comment  Walker: With 5 Inch Wheels   Patient needs a walker to treat with the following condition Weakness      08/28/21 1203             Follow-up Information     [name], Percell Miller, [name]. Schedule an appointment as soon as possible for a visit in 1 week(s).   Specialties: Internal Medicine, Family Medicine Contact information: [address] [place] [address] [phone]         Park Liter, MD. Schedule an appointment as soon as possible for a visit in 1 week(s).   Specialty: Cardiology Contact information: [place] [address] [phone]         Brand Males, MD. Schedule an appointment as soon as possible for  a visit in 1 month(s).   Specialty: Pulmonary Disease Contact information: [place] [address] [phone]         [facility] Follow up in 2 week(s).   Specialty: Neurology Contact information: [address] [place] [phone]                Major procedures and Radiology Reports - PLEASE review detailed and final reports thoroughly  -       ECHOCARDIOGRAM COMPLETE  Result Date: 08/28/2021    ECHOCARDIOGRAM REPORT   Patient Name:   KAELEM BRACH Date of Exam: 08/28/2021 Medical Rec #:  [id number]         Height:       71.0 in Accession #:    [id number]        Weight:       191.8 lb Date of Birth:  09-27-1950          BSA:          2.071 m Patient Age:    19 years          BP:           142/82 mmHg Patient Gender: M                 HR:           92 bpm. Exam Location:  Inpatient Procedure: 2D Echo, Cardiac Doppler, Color Doppler and Intracardiac            Opacification Agent  Indications:    Cardiomyopathy  History:        Patient has prior history of Echocardiogram examinations, most                 recent 07/08/2021. COPD, Arrythmias:Atrial Flutter,                 Signs/Symptoms:Syncope; Risk Factors:Sleep Apnea, Hypertension,                 Dyslipidemia and Former Smoker. S/P ablation.  Sonographer:    Clayton Lefort RDCS (AE) Referring Phys: [id number] Mackintosh [facility]  Sonographer Comments: Technically difficult study due to poor echo windows. IMPRESSIONS  1. Left ventricular ejection fraction, by estimation, is 55%. The left ventricle has normal function. The left ventricle has no regional wall motion abnormalities. There is mild left ventricular hypertrophy. Left ventricular diastolic parameters are consistent with Grade I diastolic dysfunction (impaired relaxation).  2. Right ventricular systolic function is normal. The right ventricular size is mildly enlarged. Tricuspid regurgitation signal is inadequate for assessing PA pressure.  3. The mitral valve is normal in structure. No evidence of mitral valve regurgitation. No evidence of mitral stenosis.  4. The aortic valve is tricuspid. There is mild calcification of the aortic valve. Aortic valve regurgitation is not visualized. No aortic stenosis is present.  5. The inferior vena cava is normal in size with greater than 50% respiratory variability, suggesting right atrial pressure of 3 mmHg.  6. Technically difficult study with poor acoustic windows. FINDINGS  Left Ventricle: Left ventricular ejection fraction, by estimation, is 55%. The left ventricle has normal function. The left ventricle has no regional wall motion abnormalities. Definity contrast agent was given IV to delineate the left ventricular endocardial borders. The left ventricular internal cavity size was normal in size. There is mild left ventricular hypertrophy. Left ventricular diastolic parameters are consistent with Grade I diastolic dysfunction (impaired relaxation).  Right Ventricle: The right ventricular size is mildly enlarged. No increase in right ventricular wall thickness. Right ventricular systolic function is normal. Tricuspid regurgitation signal is inadequate for assessing PA pressure. Left Atrium: Left atrial size was normal in size. Right Atrium: Right atrial size was normal in size. Pericardium: There is no evidence of pericardial effusion. Mitral Valve: The mitral valve is normal in structure. Mild mitral annular calcification. No evidence of mitral valve regurgitation. No evidence of mitral valve stenosis. Tricuspid Valve: The tricuspid valve is normal in structure. Tricuspid valve regurgitation is not demonstrated. Aortic Valve: The aortic valve is tricuspid. There is mild calcification of the aortic valve. Aortic valve regurgitation is not visualized. No aortic stenosis is present. Aortic valve mean gradient measures 2.0 mmHg. Aortic valve peak gradient measures 4.0 mmHg. Aortic valve area, by VTI measures 3.39 cm. Pulmonic Valve: The pulmonic valve was normal in structure. Pulmonic valve regurgitation is trivial. Aorta: The aortic root is normal in size and structure. Venous: The inferior vena cava is normal in size with greater than 50% respiratory variability, suggesting right atrial pressure of 3 mmHg. IAS/Shunts: No atrial level shunt detected by color flow Doppler.  LEFT VENTRICLE PLAX 2D LVIDd:         4.50 cm LVIDs:         3.30 cm LV PW:         1.40 cm LV IVS:        1.30 cm LVOT diam:     2.20 cm LV SV:         51 LV SV Index:   24 LVOT Area:     3.80 cm  RIGHT VENTRICLE             IVC RV Basal diam:  3.40 cm     IVC diam: 1.30 cm RV S prime:     14.10 cm/s TAPSE (M-mode): 2.2 cm LEFT ATRIUM             Index        RIGHT ATRIUM           Index LA diam:        3.80 cm 1.83 cm/m   RA Area:     17.60 cm LA Vol (A2C):   48.5 ml 23.41 ml/m  RA Volume:   48.80 ml  23.56 ml/m LA Vol (A4C):   32.3 ml 15.59 ml/m LA Biplane Vol: 41.3 ml 19.94 ml/m   AORTIC VALVE AV Area (Vmax):    3.39 cm AV Area (Vmean):   3.38 cm AV Area (VTI):     3.39 cm AV Vmax:           99.90 cm/s AV Vmean:          66.600 cm/s AV VTI:            0.149 m AV Peak Grad:      4.0 mmHg AV Mean Grad:      2.0 mmHg LVOT Vmax:         89.20 cm/s LVOT Vmean:        59.200 cm/s LVOT VTI:          0.133 m LVOT/AV VTI ratio: 0.89  AORTA Ao Root diam: 3.10 cm Ao Asc diam:  3.70 cm  SHUNTS Systemic VTI:  0.13 m Systemic Diam: 2.20 cm Dalton McleanMD Electronically signed by Franki Monte Signature Date/Time: 08/28/2021/4:38:59 PM    Final    Overnight EEG with video  Result Date: 08/28/2021  Lora Havens, MD     08/28/2021  1:47 PM Patient Name: Welby Montminy MRN: [id number] Epilepsy Attending: Lora Havens Referring Physician/Provider: Lesleigh Noe L Duration: 08/27/2021 1741 to 08/28/2021 1255  Patient history: 71yo M with episodes of LOC. EEG to evaluate for seizure  Level of alertness: Awake, asleep  AEDs during EEG study: None  Technical aspects: This EEG study was done with scalp electrodes positioned according to the 10-20 International system of electrode placement. Electrical activity was acquired at a sampling rate of '500Hz'$  and reviewed with a high frequency filter of '70Hz'$  and a low frequency filter of '1Hz'$ . EEG data were recorded continuously and digitally stored.  Description: The posterior dominant rhythm consists of 9-10 Hz activity of moderate voltage (25-35 uV) seen predominantly in posterior head regions, symmetric and reactive to eye opening and eye closing.  Sleep was characterized by vertex's, sleep spindles (12-14), maximal frontocentral region. Hyperventilation and photic stimulation were not performed.    IMPRESSION: This study is within normal limits. No seizures or epileptiform discharges were seen throughout the recording. Lora Havens   EEG adult  Result Date: 08/27/2021 Lora Havens, MD     08/27/2021 11:07 AM Patient Name: Tian Mcmurtrey MRN:  [id number] Epilepsy Attending: Lora Havens Referring Physician/Provider: Lorenza Chick Date: 08/27/2021 Duration: 25 mins Patient history: 71yo M with episodes of LOC. EEG to evaluate for seizure Level of alertness: Awake AEDs during EEG study: None Technical aspects: This EEG study was done with scalp electrodes positioned according to the 10-20 International system of electrode placement. Electrical activity was acquired at a sampling rate of '500Hz'$  and reviewed with a high frequency filter of '70Hz'$  and a low frequency filter of '1Hz'$ . EEG data were recorded continuously and digitally stored. Description: The posterior dominant rhythm consists of 9-10 Hz activity of moderate voltage (25-35 uV) seen predominantly in posterior head regions, symmetric and reactive to eye opening and eye closing. Hyperventilation and photic stimulation were not performed.   IMPRESSION: This study is within normal limits. No seizures or epileptiform discharges were seen throughout the recording. [place]   CT HEAD WO CONTRAST (5MM)  Result Date: 08/26/2021 CLINICAL DATA:  Delirium EXAM: CT HEAD WITHOUT CONTRAST TECHNIQUE: Contiguous axial images were obtained from the base of the skull through the vertex without intravenous contrast. RADIATION DOSE REDUCTION: This exam was performed according to the departmental dose-optimization program which includes automated exposure control, adjustment of the mA and/or kV according to patient size and/or use of iterative reconstruction technique. COMPARISON:  CT brain 08/25/2021 FINDINGS: Brain: No acute territorial infarction, hemorrhage, or intracranial mass. 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 Mild chronic small vessel ischemic changes of the white matter. Stable ventricle size Vascular: No hyperdense vessels. Carotid and vertebral vascular calcification Skull: Normal. Negative for fracture or focal lesion. Sinuses/Orbits: Fluid levels in the maxillary  sinuses with mucosal thickening. Other: None IMPRESSION: 1. No CT evidence for acute intracranial abnormality. 2. Atrophy and chronic small vessel ischemic changes of the white matter. Electronically Signed   By: Donavan Foil M.D.   On: 08/26/2021 23:45   DG Chest 2 View  Result Date: 08/26/2021 CLINICAL DATA:  Weakness. EXAM: CHEST - 2 VIEW COMPARISON:  Chest x-ray 10/19/2019. FINDINGS: The heart size and mediastinal contours are within normal limits. Both lungs are clear. The visualized skeletal structures are unremarkable. IMPRESSION: No active cardiopulmonary disease. Electronically Signed   By: Ronney Asters M.D.   On:  08/26/2021 19:02      Today   Subjective    [name] today has no headache,no chest abdominal pain,no new weakness tingling or numbness, feels much better wants to go home today.     Objective   Blood pressure 123/78, pulse 99, temperature 98.1 F (36.7 C), temperature source Oral, resp. rate 19, SpO2 95 %.  No intake or output data in the 24 hours ending 08/28/21 1712  Exam  Awake Alert, No new F.N deficits,    [place].AT,PERRAL Supple Neck,   Symmetrical Chest wall movement, Good air movement bilaterally, CTAB RRR,No Gallops,   +ve B.Sounds, Abd Soft, Non tender,  No Cyanosis, Clubbing or edema    Data Review   Recent Labs  Lab 08/26/21 1826 08/28/21 0443  WBC 7.0 6.9  HGB 15.2 13.9  HCT 46.8 42.1  PLT 295 283  MCV 99.8 96.6  MCH 32.4 31.9  MCHC 32.5 33.0  RDW 12.5 12.6  LYMPHSABS 1.5  --   MONOABS 0.9  --   EOSABS 0.0  --   BASOSABS 0.1  --     Recent Labs  Lab 08/26/21 1826 08/26/21 2340 08/27/21 0906 08/28/21 0443  NA 136  --   --  131*  K 4.3  --   --  3.6  CL 105  --   --  104  CO2 20*  --   --  22  GLUCOSE 86  --   --  108*  BUN 8  --   --  5*  CREATININE 0.77  --   --  0.71  CALCIUM 9.0  --   --  8.7*  AST 25  --   --   --   ALT 17  --   --   --   ALKPHOS 70  --   --   --   BILITOT 0.7  --   --   --   ALBUMIN 3.5   --   --   --   MG  --  2.2  --   --   LATICACIDVEN  --  2.7* 1.2  --     Total Time in preparing paper work, data evaluation and todays exam - 35 minutes  Lala Lund M.D on 08/28/2021 at 5:12 PM  [facility]

## 2021-08-28 NOTE — Plan of Care (Signed)

## 2021-08-28 NOTE — Evaluation (Addendum)
Occupational Therapy Evaluation Patient Details Name: Robert Lang MRN: [id number] DOB: [birth year] Today's Date: 08/28/2021   History of Present Illness 71 y.o. F admitted on 6/7 due to weakness and ssyncope. Recently hospitalized at [facility] due to syncope. All this following Covid Diagnosis on 5/28 and starting Plaxovid. PMH significant of hypertension, hyperlipidemia, atrial fibrillation on anticoagulation, COPD, idiopathic pulmonary fibrosis, periodic limb movement disorder, anxiety, and OSA.   Clinical Impression   Pt admitted for concerns listed above. PTA Pt reported that he was fairly independent within the house for functional mobility and ADL's. At this time pt presents with decreased activity tolerance and minor wooziness during session. BP was noted to drop when pt stood, but returned to WNL in standing. Pt continues to be able to complete BADL's and functional mobility with RW and no physical assist. Reviewed energy conservation techniques and what to do if he gets dizzy when standing/mobilizing again. Pt has no further skilled OT needs and acute OT will sign off.    VITAL's: Supine - 132/85(95) - 93 hr  Sitting - 131/88(101) - 96 hr  Standing - 107/72(84) - 102 hr  Standing 3 mins - 127/77(91) - 107 hr  Ambulating - 141/69(91) - 110 hr  spO2 ~96% throughout session, highest noted RR 41     Recommendations for follow up therapy are one component of a multi-disciplinary discharge planning process, led by the attending physician.  Recommendations may be updated based on patient status, additional functional criteria and insurance authorization.   Follow Up Recommendations  No OT follow up    Assistance Recommended at Discharge PRN  Patient can return home with the following      Functional Status Assessment  Patient has had a recent decline in their functional status and demonstrates the ability to make significant improvements in function in a reasonable and  predictable amount of time.  Equipment Recommendations  None recommended by OT    Recommendations for Other Services       Precautions / Restrictions Precautions Precautions: Fall Precaution Comments: Watch BPs Restrictions Weight Bearing Restrictions: No      Mobility Bed Mobility Overal bed mobility: Modified Independent                  Transfers Overall transfer level: Needs assistance Equipment used: Rolling walker (2 wheels) Transfers: Sit to/from Stand Sit to Stand: Supervision           General transfer comment: Supervision for steadying upon initial stand      Balance Overall balance assessment: Mild deficits observed, not formally tested                                         ADL either performed or assessed with clinical judgement   ADL Overall ADL's : At baseline;Modified independent                                       General ADL Comments: Pt able to complete BADL's and functional mobility within household distances. Limited activity tolerance     Vision Baseline Vision/History: 1 Wears glasses Ability to See in Adequate Light: 0 Adequate Patient Visual Report: No change from baseline Vision Assessment?: No apparent visual deficits     Perception     Praxis  Pertinent Vitals/Pain Pain Assessment Pain Assessment: 0-10 Pain Score: 1  Pain Location: generalized back pain Pain Descriptors / Indicators: Aching, Discomfort Pain Intervention(s): Monitored during session     Hand Dominance Right   Extremity/Trunk Assessment Upper Extremity Assessment Upper Extremity Assessment: Overall WFL for tasks assessed   Lower Extremity Assessment Lower Extremity Assessment: LLE deficits/detail LLE Deficits / Details: Sciatic pain and neuropathy LLE Sensation: history of peripheral neuropathy   Cervical / Trunk Assessment Cervical / Trunk Assessment: Kyphotic   Communication  Communication Communication: No difficulties   Cognition Arousal/Alertness: Awake/alert Behavior During Therapy: WFL for tasks assessed/performed Overall Cognitive Status: Within Functional Limits for tasks assessed                                       General Comments  Vitals listed above.    Exercises     Shoulder Instructions      Home Living Family/patient expects to be discharged to:: Private residence Living Arrangements: Spouse/significant other Available Help at Discharge: Family;Available 24 hours/day Type of Home: House Home Access: Level entry     Home Layout: Two level;Able to live on main level with bedroom/bathroom Alternate Level Stairs-Number of Steps: 6 stairs   Bathroom Shower/Tub: Occupational psychologist: Handicapped height Bathroom Accessibility: Yes How Accessible: Accessible via walker Home Equipment: [place] - single point;Rollator (4 wheels);Wheelchair - power;Wheelchair - manual;BSC/3in1;Shower seat;Grab bars - tub/shower;Grab bars - toilet;Hand held shower head          Prior Functioning/Environment Prior Level of Function : Independent/Modified Independent;Driving             Mobility Comments: Rollator/walking stick due to poor activity tolerance ADLs Comments: Indep        OT Problem List: Decreased strength;Decreased activity tolerance;Impaired balance (sitting and/or standing)      OT Treatment/Interventions:      OT Goals(Current goals can be found in the care plan section) Acute Rehab OT Goals Patient Stated Goal: To go home OT Goal Formulation: With patient Time For Goal Achievement: 08/28/21 Potential to Achieve Goals: Good  OT Frequency:      Co-evaluation              AM-PAC OT "6 Clicks" Daily Activity     Outcome Measure Help from another person eating meals?: None Help from another person taking care of personal grooming?: None Help from another person toileting, which includes  using toliet, bedpan, or urinal?: None Help from another person bathing (including washing, rinsing, drying)?: None Help from another person to put on and taking off regular upper body clothing?: None Help from another person to put on and taking off regular lower body clothing?: None 6 Click Score: 24   End of Session Equipment Utilized During Treatment: Gait belt;Rolling walker (2 wheels) Nurse Communication: Mobility status  Activity Tolerance: Patient tolerated treatment well Patient left: in bed;with call bell/phone within reach (W/ PT)  OT Visit Diagnosis: Unsteadiness on feet (R26.81);Other abnormalities of gait and mobility (R26.89);Muscle weakness (generalized) (M62.81)                Time: 4742-5956 OT Time Calculation (min): 21 min Charges:  OT General Charges $OT Visit: 1 Visit OT Evaluation $OT Eval Moderate Complexity: 1 Mod  Robert Lang H., OTR/L Acute Rehabilitation  Drevion Offord Elane Arwen Haseley 08/28/2021, 2:24 PM

## 2021-08-28 NOTE — Progress Notes (Signed)
Patient refused CPAP.

## 2021-08-28 NOTE — TOC Transition Note (Signed)
Transition of Care [facility]) - CM/SW Discharge Note   Patient Details  Name: Robert Lang MRN: [id number] Date of Birth: 1950-10-31  Transition of Care [facility]) CM/SW Contact:  Zenon Mayo, RN Phone Number: 08/28/2021, 3:33 PM   Clinical Narrative:    Patient is for dc today, he does not need pt per pt eval and patient states he does not need a rolling walker.  His wife is on the way to transport him home today. He has no needs.         Patient Goals and CMS Choice        Discharge Placement                       Discharge Plan and Services                                     Social Determinants of Health (SDOH) Interventions     Readmission Risk Interventions     No data to display

## 2021-08-28 NOTE — Evaluation (Signed)
Physical Therapy Evaluation & Discharge Patient Details Name: Robert Lang MRN: [id number] DOB: 1951-01-30 Today's Date: 08/28/2021  History of Present Illness  71 y.o. F admitted on 6/7 due to weakness and syncope. Recently hospitalized at [facility] due to syncope. All this following Covid Diagnosis on 5/28 and starting Plaxovid. PMH significant of hypertension, hyperlipidemia, atrial fibrillation on anticoagulation, COPD, idiopathic pulmonary fibrosis, periodic limb movement disorder, anxiety, and OSA.   Clinical Impression  Pt presents with condition above. PTA, he was living with his wife in a 2-level house with a level entry. Pt stays on the main level and never does the stairs. Pt mod I holding onto furniture or using his walking stick or rollator at baseline. Currently, pt reports to be and appears to be close to if not at his baseline with some noted mild balance deficits and decreased activity tolerance. Pt is already knowledgeable on Energy Conservation techniques and of his balance deficits. Pt did not need any assistance for mobility using a RW today, but min guard-supervision provided for safety due to hx of recent falls with syncope episodes. Pt did become symptomatic when quickly coming to sit up or stand, but symptoms would resolve with prolonged time in an upright position, see BP measures below. It does not appear to be related to any vestibular dysfunction as pt repeatedly clarified it as more "like about to pass out"/lightheadedness rather than the room spinning around him. Also, Marye Round tests were negative bil and Supine Head Hanging Test was negative. Educated pt to use rollator to allow him a place to sit if he becomes symptomatic and to wait after standing before ambulating to ensure his symptoms resolve. Pt verbalized understanding. All education completed and questions answered, PT will sign off.  Vitals: Supine - 132/85(95) - 93 hr  Sitting - 131/88(101) - 96 hr   Standing - 107/72(84) - 102 hr  Standing 3 mins - 127/77(91) - 107hr  Ambulating - 141/69(91) - 110hr  spO2 96%, RR 41     Recommendations for follow up therapy are one component of a multi-disciplinary discharge planning process, led by the attending physician.  Recommendations may be updated based on patient status, additional functional criteria and insurance authorization.  Follow Up Recommendations No PT follow up    Assistance Recommended at Discharge Intermittent Supervision/Assistance  Patient can return home with the following  Assistance with cooking/housework;Assist for transportation    Equipment Recommendations None recommended by PT  Recommendations for Other Services       Functional Status Assessment Patient has not had a recent decline in their functional status     Precautions / Restrictions Precautions Precautions: Fall Precaution Comments: Watch BPs Restrictions Weight Bearing Restrictions: No      Mobility  Bed Mobility Overal bed mobility: Modified Independent             General bed mobility comments: No need for assistance, HOB elevated    Transfers Overall transfer level: Needs assistance Equipment used: Rolling walker (2 wheels) Transfers: Sit to/from Stand Sit to Stand: Supervision           General transfer comment: Supervision for steadying safety upon initial stand    Ambulation/Gait Ambulation/Gait assistance: Min guard, Supervision, +2 safety/equipment Gait Distance (Feet): 230 Feet Assistive device: Rolling walker (2 wheels) Gait Pattern/deviations: Step-through pattern, Decreased stride length Gait velocity: reduced Gait velocity interpretation: >2.62 ft/sec, indicative of community ambulatory   General Gait Details: Pt with fairly fast pace with RW, no LOB, mild  trunk sway when stops to stand statically, min guard-supervision for safety. x2 standing rest breaks  Stairs            Wheelchair Mobility     Modified Rankin (Stroke Patients Only)       Balance Overall balance assessment: Needs assistance Sitting-balance support: No upper extremity supported, Feet supported Sitting balance-Leahy Scale: Good     Standing balance support: Bilateral upper extremity supported, Single extremity supported, During functional activity Standing balance-Leahy Scale: Poor Standing balance comment: Reliant on UE support                             Pertinent Vitals/Pain Pain Assessment Pain Assessment: 0-10 Pain Score: 1  Pain Location: generalized back pain Pain Descriptors / Indicators: Aching, Discomfort Pain Intervention(s): Limited activity within patient's tolerance, Monitored during session, Repositioned    Home Living Family/patient expects to be discharged to:: Private residence Living Arrangements: Spouse/significant other Available Help at Discharge: Family;Available 24 hours/day Type of Home: House Home Access: Level entry     Alternate Level Stairs-Number of Steps: 6 stairs Home Layout: Two level;Able to live on main level with bedroom/bathroom Home Equipment: Cane - single point;Rollator (4 wheels);Wheelchair - power;Wheelchair - manual;BSC/3in1;Shower seat;Grab bars - tub/shower;Grab bars - toilet;Hand held shower head      Prior Function Prior Level of Function : Independent/Modified Independent;Driving;History of Falls (last six months)             Mobility Comments: Rollator/walking stick due to poor activity tolerance, holds onto furniture in home. Hx of falls. ADLs Comments: Indep     Hand Dominance   Dominant Hand: Right    Extremity/Trunk Assessment   Upper Extremity Assessment Upper Extremity Assessment: Defer to OT evaluation    Lower Extremity Assessment Lower Extremity Assessment: LLE deficits/detail;RLE deficits/detail RLE Deficits / Details: neuropathy; WFL strength RLE Sensation: history of peripheral neuropathy LLE Deficits /  Details: Sciatic pain and neuropathy; WFL strength LLE Sensation: history of peripheral neuropathy    Cervical / Trunk Assessment Cervical / Trunk Assessment: Kyphotic  Communication   Communication: No difficulties  Cognition Arousal/Alertness: Awake/alert Behavior During Therapy: WFL for tasks assessed/performed Overall Cognitive Status: Within Functional Limits for tasks assessed                                          General Comments General comments (skin integrity, edema, etc.): Supine - 132/85(95) - 93 hr;  Sitting - 131/88(101) - 96 hr;   Standing - 107/72(84) - 102 hr;   Standing 3 mins - 127/77(91) - 107hr;   Ambulating - 141/69(91) - 110hr   spO2 96%, RR 41; educated pt to wait to walk away from surfaces to allow BP to adjust and to use rollator for place to sit if symptomatic, pt verbalized understanding; Dix Hallpike negative bil and Supine Head Hanging Test negative    Exercises     Assessment/Plan    PT Assessment Patient does not need any further PT services  PT Problem List         PT Treatment Interventions      PT Goals (Current goals can be found in the Care Plan section)  Acute Rehab PT Goals Patient Stated Goal: to go home PT Goal Formulation: All assessment and education complete, DC therapy Time For Goal Achievement: 08/29/21 Potential to Achieve  Goals: Good    Frequency       Co-evaluation PT/OT/SLP Co-Evaluation/Treatment: Yes Reason for Co-Treatment: For patient/therapist safety;To address functional/ADL transfers PT goals addressed during session: Mobility/safety with mobility;Balance;Proper use of DME         AM-PAC PT "6 Clicks" Mobility  Outcome Measure Help needed turning from your back to your side while in a flat bed without using bedrails?: None Help needed moving from lying on your back to sitting on the side of a flat bed without using bedrails?: None Help needed moving to and from a bed to a chair (including  a wheelchair)?: A Little Help needed standing up from a chair using your arms (e.g., wheelchair or bedside chair)?: A Little Help needed to walk in hospital room?: A Little Help needed climbing 3-5 steps with a railing? : A Little 6 Click Score: 20    End of Session Equipment Utilized During Treatment: Gait belt Activity Tolerance: Patient tolerated treatment well Patient left: in bed;with call bell/phone within reach;with bed alarm set Nurse Communication: Mobility status;Other (comment) (BP) PT Visit Diagnosis: History of falling (Z91.81);Unsteadiness on feet (R26.81)    Time: 6283-1517 PT Time Calculation (min) (ACUTE ONLY): 31 min   Charges:   PT Evaluation $PT Eval Moderate Complexity: 1 Mod          Moishe Spice, PT, DPT Acute Rehabilitation Services  Office: [phone]   Orvan Falconer 08/28/2021, 2:38 PM

## 2021-08-28 NOTE — Procedures (Addendum)
Patient Name: Robert Lang  MRN: [id number]  Epilepsy Attending: Lora Havens  Referring Physician/Provider: Lesleigh Noe L Duration: 08/27/2021 1741 to 08/28/2021 1255   Patient history: 71yo M with episodes of LOC. EEG to evaluate for seizure   Level of alertness: Awake, asleep   AEDs during EEG study: None   Technical aspects: This EEG study was done with scalp electrodes positioned according to the 10-20 International system of electrode placement. Electrical activity was acquired at a sampling rate of '500Hz'$  and reviewed with a high frequency filter of '70Hz'$  and a low frequency filter of '1Hz'$ . EEG data were recorded continuously and digitally stored.    Description: The posterior dominant rhythm consists of 9-10 Hz activity of moderate voltage (25-35 uV) seen predominantly in posterior head regions, symmetric and reactive to eye opening and eye closing.  Sleep was characterized by vertex's, sleep spindles (12-14), maximal frontocentral region. Hyperventilation and photic stimulation were not performed.      IMPRESSION: This study is within normal limits. No seizures or epileptiform discharges were seen throughout the recording.  Lemon Sternberg Barbra Sarks

## 2021-08-28 NOTE — Discharge Instructions (Addendum)
Do not drive till you are cleared to do so by the neurologist.    Follow with Primary MD [name], Percell Miller, [name] in 7 days   Get CBC, CMP, 2 view Chest X ray -  checked next visit within 1 week by Primary MD   Activity: As tolerated with Full fall precautions use walker/cane & assistance as needed  Disposition Home   Diet: Heart Healthy    Special Instructions: If you have smoked or chewed Tobacco  in the last 2 yrs please stop smoking, stop any regular Alcohol  and or any Recreational drug use.  On your next visit with your primary care physician please Get Medicines reviewed and adjusted.  Please request your Prim.MD to go over all Hospital Tests and Procedure/Radiological results at the follow up, please get all Hospital records sent to your Prim MD by signing hospital release before you go home.  If you experience worsening of your admission symptoms, develop shortness of breath, life threatening emergency, suicidal or homicidal thoughts you must seek medical attention immediately by calling 911 or calling your MD immediately  if symptoms less severe.  You Must read complete instructions/literature along with all the possible adverse reactions/side effects for all the Medicines you take and that have been prescribed to you. Take any new Medicines after you have completely understood and accpet all the possible adverse reactions/side effects.

## 2021-08-28 NOTE — Progress Notes (Signed)
Subjective: No further events overnight.  Patient states he has had some sleep and feels much better.  ROS: negative except above  Examination  Vital signs in last 24 hours: Pulse Rate:  [83-98] 93 (06/08 1730) Resp:  [14-29] 18 (06/08 1700) BP: (127-171)/(67-92) 142/82 (06/08 1730) SpO2:  [90 %-100 %] 95 % (06/08 1730)  General: lying in bed, NAD Neuro: AOx3, cranial nerves 2-12 grossly intact, spontaneously moving all 4 extremities in bed  Basic Metabolic Panel: Recent Labs  Lab 08/26/21 1826 08/26/21 2340 08/28/21 0443  NA 136  --  131*  K 4.3  --  3.6  CL 105  --  104  CO2 20*  --  22  GLUCOSE 86  --  108*  BUN 8  --  5*  CREATININE 0.77  --  0.71  CALCIUM 9.0  --  8.7*  MG  --  2.2  --     CBC: Recent Labs  Lab 08/26/21 1826 08/28/21 0443  WBC 7.0 6.9  NEUTROABS 4.4  --   HGB 15.2 13.9  HCT 46.8 42.1  MCV 99.8 96.6  PLT 295 283     Coagulation Studies: No results for input(s): "LABPROT", "INR" in the last 72 hours.  Imaging CT head without contrast 08/26/2021: No CT evidence for acute intracranial abnormality. Atrophy and chronic small vessel ischemic changes of the white matter.  MRI brain without contrast (done at [place] therefore images unable for review) 08/25/2021: No acute intracranial abnormality.  Findings of chronic small vessel ischemia and generalized cerebral volume loss.   ASSESSMENT AND PLAN: 71 year old man with recent COVID infection, multiple medical comorbidities presented with syncopal episodes.  Transient loss of awareness -LTM EEG did not show any ictal-interictal abnormality --Patient does not have any risk factors for epilepsy.  Episode started after recent COVID infection and are most likely convulsive syncope  Recommendations -Discontinue LTM EEG as no events overnight -Do not recommend starting antiseizure medications for now given lack of epilepsy risk factors, semiology of episodes, normal EEG and MRI. Did  discuss seizure precautions including do not drive  -If episodes recur, can consider prolonged EEG monitoring and starting AEDs at that time -Management of rest of comorbidities per primary team  I have spent a total of  36  minutes with the patient reviewing hospital notes,  test results, labs and examining the patient as well as establishing an assessment and plan that was discussed personally with the patient.  > 50% of time was spent in direct patient care.    Zeb Comfort Epilepsy [facility] For questions after 5pm please refer to AMION to reach the Neurologist on call

## 2021-08-30 ENCOUNTER — Other Ambulatory Visit: Payer: Self-pay | Admitting: Medical

## 2021-08-31 ENCOUNTER — Telehealth: Payer: Self-pay | Admitting: *Deleted

## 2021-08-31 NOTE — Telephone Encounter (Signed)
Pt scheduled for follow up on 09/02/21

## 2021-08-31 NOTE — Telephone Encounter (Signed)
Who Is Calling Patient / Member / Family / Caregiver Call Type Triage / Clinical Caller Name [name] Relationship To Patient Spouse Return Phone Number [phone] (Primary) Chief Complaint Weakness, Generalized Reason for Call Symptomatic / Request for [place] states her husband is experiencing covid with all over weakness. Translation No Nurse Assessment Nurse: Radford Pax, RN, Eugene Garnet Date/Time (Eastern Time): 08/31/2021 8:59:20 AM Confirm and document reason for call. If symptomatic, describe symptoms. ---Positive for covid 5/29. Has been having recurring episodes of passing out. D/c on Friday after latest episode. Had been very dehydrated. Was told to call PCP for follow up. Still weak but much better. No recent episodes of syncope.  08/31/2021 9:13:00 AM See PCP within 24 Hours Yes Turner, RN, Eugene Garnet

## 2021-09-01 LAB — CULTURE, BLOOD (ROUTINE X 2)
Culture: NO GROWTH
Culture: NO GROWTH
Special Requests: ADEQUATE

## 2021-09-02 ENCOUNTER — Encounter: Payer: Self-pay | Admitting: Medical

## 2021-09-02 ENCOUNTER — Other Ambulatory Visit: Payer: Self-pay | Admitting: Medical

## 2021-09-02 ENCOUNTER — Ambulatory Visit (HOSPITAL_BASED_OUTPATIENT_CLINIC_OR_DEPARTMENT_OTHER)
Admission: RE | Admit: 2021-09-02 | Discharge: 2021-09-02 | Disposition: A | Discharge status: Home / Self Care | Payer: Medicare Other | Source: Ambulatory Visit | Attending: Medical | Admitting: Medical

## 2021-09-02 ENCOUNTER — Ambulatory Visit (INDEPENDENT_AMBULATORY_CARE_PROVIDER_SITE_OTHER): Payer: Medicare Other | Admitting: Medical

## 2021-09-02 VITALS — BP 105/68 | HR 99 | Resp 18 | Ht 71.0 in | Wt 190.0 lb

## 2021-09-02 DIAGNOSIS — E871 Hypo-osmolality and hyponatremia: Secondary | ICD-10-CM

## 2021-09-02 DIAGNOSIS — R5383 Other fatigue: Secondary | ICD-10-CM | POA: Diagnosis not present

## 2021-09-02 DIAGNOSIS — I9589 Other hypotension: Secondary | ICD-10-CM

## 2021-09-02 DIAGNOSIS — R0609 Other forms of dyspnea: Secondary | ICD-10-CM | POA: Insufficient documentation

## 2021-09-02 DIAGNOSIS — R531 Weakness: Secondary | ICD-10-CM

## 2021-09-02 DIAGNOSIS — J84112 Idiopathic pulmonary fibrosis: Secondary | ICD-10-CM | POA: Diagnosis not present

## 2021-09-02 DIAGNOSIS — R0600 Dyspnea, unspecified: Secondary | ICD-10-CM | POA: Diagnosis not present

## 2021-09-02 DIAGNOSIS — R55 Syncope and collapse: Secondary | ICD-10-CM | POA: Diagnosis not present

## 2021-09-02 LAB — COMPREHENSIVE METABOLIC PANEL
AG Ratio: 1.2 (calc) (ref 1.0–2.5)
ALT: 20 U/L (ref 9–46)
AST: 14 U/L (ref 10–35)
Albumin: 3.9 g/dL (ref 3.6–5.1)
Alkaline phosphatase (APISO): 80 U/L (ref 35–144)
BUN: 14 mg/dL (ref 7–25)
CO2: 27 mmol/L (ref 20–32)
Calcium: 9.7 mg/dL (ref 8.6–10.3)
Chloride: 101 mmol/L (ref 98–110)
Creat: 0.92 mg/dL (ref 0.70–1.28)
Globulin: 3.2 g/dL (calc) (ref 1.9–3.7)
Glucose, Bld: 84 mg/dL (ref 65–99)
Potassium: 4.7 mmol/L (ref 3.5–5.3)
Sodium: 136 mmol/L (ref 135–146)
Total Bilirubin: 0.4 mg/dL (ref 0.2–1.2)
Total Protein: 7.1 g/dL (ref 6.1–8.1)

## 2021-09-02 LAB — CBC WITH DIFFERENTIAL/PLATELET
Absolute Monocytes: 1458 cells/uL — ABNORMAL HIGH (ref 200–950)
Basophils Absolute: 81 cells/uL (ref 0–200)
Basophils Relative: 1.1 %
Eosinophils Absolute: 0 cells/uL — ABNORMAL LOW (ref 15–500)
Eosinophils Relative: 0 %
HCT: 42.4 % (ref 38.5–50.0)
Hemoglobin: 14.9 g/dL (ref 13.2–17.1)
Lymphs Abs: 2620 cells/uL (ref 850–3900)
MCH: 34 pg — ABNORMAL HIGH (ref 27.0–33.0)
MCHC: 35.1 g/dL (ref 32.0–36.0)
MCV: 96.8 fL (ref 80.0–100.0)
MPV: 9.9 fL (ref 7.5–12.5)
Monocytes Relative: 19.7 %
Neutro Abs: 3241 cells/uL (ref 1500–7800)
Neutrophils Relative %: 43.8 %
Platelets: 278 10*3/uL (ref 140–400)
RBC: 4.38 10*6/uL (ref 4.20–5.80)
RDW: 12.8 % (ref 11.0–15.0)
Total Lymphocyte: 35.4 %
WBC: 7.4 10*3/uL (ref 3.8–10.8)

## 2021-09-02 LAB — BRAIN NATRIURETIC PEPTIDE: Brain Natriuretic Peptide: 37 pg/mL (ref <100)

## 2021-09-02 MED ORDER — FLUTICASONE PROPIONATE 50 MCG/ACT NA SUSP: 2.0000 | Freq: Every day | NASAL | 1 refills | Status: DC

## 2021-09-02 NOTE — Patient Instructions (Addendum)
Possible vasovagal syncope one week ago around 08-25-2021( post covid infection onset) . Some dizziness that preceded. Other contributing causes low sodium, dehydration and low bp. Considering recent use of Rapaflo(silodosin) maybe playing role. Want you to dc this and flomax. Continue finasteride.    For bph continue finasteride. Contact urologist to see if they recommend just staying on this as they need to be aware your bp has been very low.  Stay hydrated.  Extensive neuro work up negative for syncope cause ct, mri and eeg negative.  Cardiologist appointment this coming Monday.   Low sodium may be related to sertraline.  Idiopathic pulmonary fibrosis and dyspnea worse since covid infection. Ask you call pulmonologist to get seen quickly. Will get cxr and bnp today.  Get cmp and cbc stat as follow up per dc summary instruction.  Follow up in 2 weeks or as needed.

## 2021-09-02 NOTE — Addendum Note (Signed)
Addended by: Anabel Halon on: 09/02/2021 03:31 PM   Modules accepted: Orders

## 2021-09-02 NOTE — Progress Notes (Addendum)
Subjective:    Patient ID: Robert Lang, male    DOB: [birth year], 71 y.o.   MRN: [id number]  HPI  Pt in for follow up.  Pt states he is very frustrated. He state he want to flush all his meds. He is expressing extreme frustration.  W weeks ago he had covid. He states frustrated with quality of life.  Pt was given paxlovid.   Recent 2 hospitazations over past 1 week.  08-25-2021 1st hospitalization summary below.   "Hospital Course:  1. Syncope - no clear etiology for patient's recurrent events. Patient was evaluated with MRI of the brain demonstrating no acute intracranial abnormality, specifically no mass, hemorrhage nor cerebrovascular accident. CT angiogram of the lungs demonstrated no evidence of pulmonary embolism. Orthostatic vital signs demonstrate no evidence of orthostatic hypotension. Patient reported that he had similar symptoms years ago with hypokalemia. Potassium evaluated and was within normal limits. Patient was diagnosed with COVID-19 respiratory infection approximately 1 week ago and had been taking Paxlovid. Patient's syncopal events may be secondary to advanced IPF in the setting of recent COVID-19 infection. Paxlovid may have been contributory and patient notes a temporal relationship between syncopal events and Paxlovid use. Patient appears medically stable for discharge and should "take it easy." Over the next few days while he recovers from acute viral syndrome. Patient should follow-up with primary care physician and pulmonologist over the next 1 to 2 weeks."  Second hospitalization below in ".  Admit date: 08/26/2021  Discharge date: 08/28/2021   Admitted From: Home   Disposition:  Home     Recommendations for Outpatient Follow-up:    Follow up with PCP in 1-2 weeks   PCP Please obtain BMP/CBC, 2 view CXR in 1week,  (see Discharge instructions)    PCP Please follow up on the following pending results: Monitor BMP closely, if sodium persists to be low  consider SIADH due to Zoloft.  Needs outpatient Cards follow up in 1 week and neurology follow-up in 14-21 days.  If weakness continues then outpatient 30-day event monitor to be arranged by PCP.                                                                  Hospital Course      Acute metabolic encephalopathy - Recurrent syncope question due to dehydration recent COVID-19 infection in elderly patient -  Patient had been recently worked up for syncopal episodes and weakness.  While in the ED patient had an event where he was staring off with left leg shaking.  Family notes he has not been back to his baseline.  Question possibility of seizure-like activity.  Patient has been evaluated by neurology who thought symptoms were more likely secondary to convulsive syncope. Previous work-up done 2 days ago noted negative MRI of the brain, negative CT angiogram of the chest for pulmonary embolus.  He was kept here on telemetry bed with IV fluids, much improved after hydration, EEG long-term unremarkable, he is now back to baseline and eager to go home.  Per neurology outpatient follow-up with Robert neuro, do not drive till cleared by neurologist.  Do not think clinically that he had seizure but think that most of his symptoms are due to dehydration caused by recent COVID-19  infection.  He feels much better after IV fluids and will be discharged home with home health PT with outpatient PCP follow-up.  If symptoms recur please consider outpatient 30-day Holter monitor.     Plan discussed with wife Robert Lang agrees with the plan.       Lactic acidosis due to dehydration.  Much improved after IV fluids, no sepsis.    Bifascicular block  Noted on initial EKG.  Vital signs currently appear to be stable.  Unclear if this is a factor in patient's symptoms.  Discussed with on-call cardiologist who recommended just monitoring on telemetry overnight.  Stable overnight telemetry monitoring, patient follow-up with his  cardiologist within a week.    Falls at home -[place] due to dehydration as above, much improved with IV fluids, close to his baseline,.  CT imaging of the brain did not note any acute abnormality.  Home PT and walker.    CAD s/p PCI - Patient with prior history of STEMI in 12/2018 requiring PCI and drug-eluting stent to the RCA.  High-sensitivity troponins negative x2.   History of COVID-19 - Initially diagnosed on 5/29.  He has completed outpatient Paxlovid treatment, I think he had minimal inflammation due to COVID-19 enough to make him dehydrated with failure to thrive.  Much better now.   Idiopathic pulmonary fibrosis COPD - Patient is followed by Dr. Chase Lang in outpatient setting.  O2 saturations currently maintained on room air.  Stable exam.  He is symptom-free on room air post discharge will follow with his pulmonologist.   Atrial flutter s/p ablation in 2070 chronic anticoagulation  -Continue Xarelto   Anxiety  - Patient was noted to be significant anxious and hyper plan relating at times while in the ED.    Hyperlipidemia  -Continue atorvastatin   BPH  - Continue Proscar and pharmacy substitution of Flomax for silodosin   GERD  - Continue Pepcid and Protonix   OSA on CPAP  -Continue CPAP nightly   Hyponatremia - acute on chronic clinically appears to be dehydrated and was hydrated adequately, repeat BMP by PCP, he is also on Zoloft which could cause SIADH in the long-term.  Note his sodium has chronically been low as well.   H.O AV Valve density -  being monitored by his Cardiologist, TTE repeat here stable       Discharge diagnosis       Principal Problem:   Recurrent syncope Active Problems:   Lactic acidosis   Bifascicular block   Falls   Coronary artery disease   History of COVID-19   IPF (idiopathic pulmonary fibrosis) (HCC)   Status post ablation of atrial flutter   Anticoagulated   COPD (chronic obstructive pulmonary disease) (HCC)   Obstructive sleep  apnea   Acute metabolic encephalopathy       Discharge instructions     Discharge Instructions       Diet - low sodium heart healthy   Complete by: As directed      Discharge instructions   Complete by: As directed      Do not drive till you are cleared to do so by the neurologist.     Follow with Primary MD Georganne Siple, Percell Miller, [name] in 7 days    Get CBC, CMP, 2 view Chest X ray -  checked next visit within 1 week by Primary MD    Activity: As tolerated with Full fall precautions use walker/cane & assistance as needed   Disposition Home    Diet:  Heart Healthy     Special Instructions: If you have smoked or chewed Tobacco  in the last 2 yrs please stop smoking, stop any regular Alcohol  and or any Recreational drug use.   On your next visit with your primary care physician please Get Medicines reviewed and adjusted.   Please request your Prim.MD to go over all Hospital Tests and Procedure/Radiological results at the follow up, please get all Hospital records sent to your Prim MD by signing hospital release before you go home.   If you experience worsening of your admission symptoms, develop shortness of breath, life threatening emergency, suicidal or homicidal thoughts you must seek medical attention immediately by calling 911 or calling your MD immediately  if symptoms less severe.   You Must read complete instructions/literature along with all the possible adverse reactions/side effects for all the Medicines you take and that have been prescribed to you. Take any new Medicines after you have completely understood and accpet all the possible adverse reactions/side effects.    Increase activity slowly        Review of Systems  Constitutional:  Positive for fatigue. Negative for chills.  Respiratory:  Positive for shortness of breath. Negative for cough, chest tightness and wheezing.   Cardiovascular:  Negative for chest pain and palpitations.  Gastrointestinal:  Negative for anal  bleeding.  Genitourinary:  Positive for frequency. Negative for decreased urine volume, dysuria, hematuria, penile pain and testicular pain.  Musculoskeletal:  Negative for back pain and myalgias.  Neurological:  Negative for dizziness, light-headedness and headaches.  Hematological:  Negative for adenopathy. Does not bruise/bleed easily.  Psychiatric/Behavioral:  Negative for behavioral problems and confusion.     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 M75.00)   Allergic rhinitis due to pollen 09/03/2013   J30.1)  Formatting of this note might be different from the original. J30.1)   Allergy    Anticoagulated 07/01/2016   Anxiety    Arthritis    Atrial fibrillation ([place])    Atrial flutter ([place]) 06/26/2015   CAD (coronary artery disease)    Chest pain 06/26/2015   COPD (chronic obstructive pulmonary disease) ([place])    Coronary artery disease 07/06/2018   Cardiac catheterization 2017 showing 90% small diagonal branch disease   DDD (degenerative disc disease), cervical 09/03/2013   M50.90)  Formatting of this note might be different from the original. M50.90)   Depression    Depression    Depressive disorder 09/03/2013   Dizziness 10/28/2017   Dyslipidemia, goal LDL below 70 07/06/2018   Dyspnea on exertion 10/20/2018   Esophageal reflux 09/03/2013   Essential hypertension 07/06/2018   ETOH abuse 01/11/2019   6 pack of beer per day   Falls 10/28/2017   GERD (gastroesophageal reflux disease)    H/O amiodarone therapy 07/01/2016   Heart attack ([place])    Heart palpitations 01/27/2017   History of colon polyps    Hyperlipidemia    Hypertension    IPF (idiopathic pulmonary fibrosis) ([place])    Kidney stones    Neck pain 09/03/2013   Neuropathy 07/06/2018   Obstructive sleep  apnea 08/05/2015   PLMD (periodic limb movement disorder) 11/04/2015   Polycythemia, 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 STORY: Due to alcohol/ tobacco   Pure hypercholesterolemia 09/03/2013   E78.0)  Formatting of this note might be different from the original. E78.0)   Screening for prostate cancer 09/03/2013   Smoker 01/11/2019   Smoking 07/06/2018   Status post ablation of atrial flutter 07/06/2018   2017     Social History   Socioeconomic History   Marital status: Married    Spouse name: Not on file   Number of children: Not on file   Years of education: Not on file   Highest education level: Not on file  Occupational History   Not on file  Tobacco Use   Smoking status: Former    Packs/day: 1.00    Years: 30.00    Total pack years: 30.00    Types: Cigarettes    Quit date: 01/10/2019    Years since quitting: 2.6   Smokeless tobacco: Former  Scientific laboratory technician Use: Never used  Substance and Sexual Activity   Alcohol use: Yes    Comment: 8oz of wine a day   Drug use: Never   Sexual activity: Not on file  Other Topics Concern   Not on file  Social History Narrative   Not on file   Social Determinants of Health   Financial Resource Strain: Low Risk  (11/25/2020)   Overall Financial Resource Strain (CARDIA)    Difficulty of Paying Living Expenses: Not hard at all  Food Insecurity: No Food Insecurity (11/25/2020)   Hunger Vital Sign    Worried About Running Out of Food in the Last Year: Never true    Ran Out of Food in the Last Year: Never true  Transportation Needs: No Transportation Needs (11/25/2020)   PRAPARE - Hydrologist (Medical): No    Lack of Transportation (Non-Medical): No  Physical Activity: Inactive (11/25/2020)   Exercise Vital Sign    Days of Exercise per Week: 0 days    Minutes of Exercise per Session: 0 min  Stress: No Stress Concern  Present (11/25/2020)   [place]    Feeling of Stress : Not at all  Social Connections: Moderately Integrated (11/25/2020)   Social Connection and Isolation Panel [NHANES]    Frequency of Communication with Friends and Family: More than three times a week    Frequency of Social Gatherings with Friends and Family: More than three times a week    Attends Religious Services: More than 4 times per year    Active Member of [employer] or Organizations: No    Attends Archivist Meetings: Never    Marital Status: Married  Human resources officer Violence: Not At Risk (11/25/2020)   Humiliation, Afraid, Rape, and Kick questionnaire    Fear of Current or Ex-Partner: No    Emotionally Abused: No    Physically Abused: No    Sexually Abused: No    Past Surgical History:  Procedure Laterality Date   ATRIAL FIBRILLATION ABLATION  10/2015   BACK SURGERY     CARDIOVERSION  2017   CATARACT EXTRACTION Bilateral 2017   March and April 2017   COLONOSCOPY  2018   CORONARY STENT PLACEMENT  2012   CORONARY/GRAFT ACUTE MI REVASCULARIZATION N/A 01/10/2019   Procedure: CORONARY/GRAFT ACUTE MI REVASCULARIZATION;  Surgeon: Leonie Man, MD;  Location: [facility];  Service: Cardiovascular;  Laterality: N/A;   INGUINAL HERNIA REPAIR     over 20 years ago  LAPAROSCOPIC APPENDECTOMY N/A 01/17/2021   Procedure: APPENDECTOMY LAPAROSCOPIC;  Surgeon: Felicie Morn, MD;  Location: [facility];  Service: General;  Laterality: N/A;   LEFT HEART CATH AND CORONARY ANGIOGRAPHY N/A 01/10/2019   Procedure: LEFT HEART CATH AND CORONARY ANGIOGRAPHY;  Surgeon: Leonie Man, MD;  Location: [facility];  Service: Cardiovascular;  Laterality: N/A;   LUMBAR LAMINECTOMY Bilateral 04/05/2016   L2-L5    VENTRAL HERNIA REPAIR  2018    Family History  Problem Relation Age of Onset   Colon cancer Father    Throat cancer Brother     Allergies   Allergen Reactions   Naproxen Other (See Comments)    (Naprosyn *ANALGESICS - ANTI-INFLAMMATORY*) Nausea, Abdominal pain    Current Outpatient Medications on File Prior to Visit  Medication Sig Dispense Refill   acetaminophen (TYLENOL) 325 MG tablet Take 2 tablets (650 mg total) by mouth every 6 (six) hours as needed. (Patient taking differently: Take 650 mg by mouth every 6 (six) hours as needed for mild pain or moderate pain.)     atorvastatin (LIPITOR) 80 MG tablet Take 1 tablet (80 mg total) by mouth at bedtime. 90 tablet 3   buPROPion (WELLBUTRIN XL) 300 MG 24 hr tablet Take 1 tablet (300 mg total) by mouth daily. 90 tablet 0   busPIRone (BUSPAR) 15 MG tablet Take 15 mg by mouth 2 (two) times daily.     Cholecalciferol 25 MCG (1000 UT) tablet Take 1,000 Units by mouth daily.      docusate sodium (COLACE) 100 MG capsule Take 100 mg by mouth daily.     famotidine (PEPCID) 40 MG tablet Take 40 mg by mouth at bedtime.     fexofenadine (ALLEGRA) 180 MG tablet Take 180 mg by mouth at bedtime.      finasteride (PROSCAR) 5 MG tablet Take 1 tablet (5 mg total) by mouth daily. (Patient taking differently: Take 5 mg by mouth at bedtime.) 90 tablet 1   fluticasone (FLONASE) 50 MCG/ACT nasal spray Place 1 spray into both nostrils daily as needed for allergies or rhinitis.     gabapentin (NEURONTIN) 100 MG capsule TAKE 4 CAPSULES BY MOUTH AT BEDTIME (Patient taking differently: Take 100 mg by mouth at bedtime. TAKE 4 CAPSULES BY MOUTH AT BEDTIME) 120 capsule 3   Ginger, Zingiber officinalis, (GINGER ROOT) 550 MG CAPS Take 550 mg by mouth daily.     melatonin 5 MG TABS Take 10 mg by mouth at bedtime.     Multiple Vitamins-Minerals (PRESERVISION AREDS PO) Take 1 capsule by mouth in the morning and at bedtime.     nitroGLYCERIN (NITROSTAT) 0.4 MG SL tablet Place 1 tablet (0.4 mg total) under the tongue every 5 (five) minutes as needed for chest pain. 25 tablet 3   NON FORMULARY CPAP at bedtime      ondansetron (ZOFRAN) 4 MG tablet TAKE 1 TABLET(4 MG) BY MOUTH EVERY 8 HOURS AS NEEDED FOR NAUSEA OR VOMITING (Patient taking differently: Take 4 mg by mouth every 8 (eight) hours as needed for vomiting or nausea.) 20 tablet 0   pantoprazole (PROTONIX) 40 MG tablet Take 1 tablet (40 mg total) by mouth daily. 90 tablet 3   Pirfenidone (ESBRIET) 801 MG TABS Take 801 mg by mouth 3 (three) times daily. 270 tablet 1   Polyethylene Glycol 3350 (MIRALAX PO) Take 17 g by mouth daily.     ranolazine (RANEXA) 1000 MG SR tablet Take 1 tablet (1,000 mg total) by  mouth 2 (two) times daily. 180 tablet 1   rivaroxaban (XARELTO) 20 MG TABS tablet TAKE 1 TABLET(20 MG) BY MOUTH DAILY WITH SUPPER (Patient taking differently: Take 20 mg by mouth daily with supper.) 30 tablet 5   sertraline (ZOLOFT) 100 MG tablet Take 100 mg by mouth at bedtime.     silodosin (RAPAFLO) 8 MG CAPS capsule Take 8 mg by mouth at bedtime.     traZODone (DESYREL) 50 MG tablet TAKE 1/2 TO 1 TABLET(25 TO 50 MG) BY MOUTH AT BEDTIME AS NEEDED FOR SLEEP 30 tablet 3   vitamin B-12 (CYANOCOBALAMIN) 1000 MCG tablet Take 1,000 mcg by mouth daily.     No current facility-administered medications on file prior to visit.    BP 105/68   Pulse 99   Resp 18   Ht '5\' 11"'$  (1.803 m)   Wt 190 lb (86.2 kg)   SpO2 93%   BMI 26.50 kg/m        Objective:   Physical Exam  General Mental Status- Alert. General Appearance- Not in acute distress.   Skin General: Color- Normal Color. Moisture- Normal Moisture.  Neck Carotid Arteries- Normal color. Moisture- Normal Moisture. No carotid bruits. No JVD.  Chest and Lung Exam Auscultation: Breath Sounds:-Normal.  Cardiovascular Auscultation:Rythm- Regular. Murmurs & Other Heart Sounds:Auscultation of the heart reveals- No Murmurs.  Abdomen Inspection:-Inspeection Normal. Palpation/Percussion:Note:No mass. Palpation and Percussion of the abdomen reveal- Non Tender, Non Distended + BS, no rebound or  guarding.    Neurologic Cranial Nerve exam:- CN III-XII intact(No nystagmus), symmetric smile. Drift Test:- No drift. Romberg Exam:- Negative.  Heal to Toe Gait exam:-Normal. Finger to Nose:- Normal/Intact Strength:- 5/5 equal and symmetric strength both upper and lower extremities.   Lower ext- no pedal edema.    Assessment & Plan:   Patient Instructions  Possible vasovagal syncope one week ago around 08-25-2021( post covid infection onset) . Some dizziness that preceded. Other contributing causes low sodium, dehydration and low bp. Considering recent use of Rapaflo(silodosin) maybe playing role. Want you to dc this and flomax. Continue finasteride.    For bph continue finasteride. Contact urologist to see if they recommend just staying on this as they need to be aware your bp has been very low.  Stay hydrated.  Extensive Lang work up negative for syncope cause ct, mri and eeg negative.  Cardiologist appointment this coming Monday.   Low sodium may be related to sertraline.  Idiopathic pulmonary fibrosis and dyspnea worse since covid infection. Ask you call pulmonologist to get seen quickly. Will get cxr and bnp today.  Get cmp and cbc stat as follow up per dc summary instruction.  Follow up in 2 weeks or as needed.      Mackie Pai, [name]   Time spent with patient today was  44 minutes which consisted of chart review, discussing diagnosis, work up, treatment and documentation.

## 2021-09-02 NOTE — Addendum Note (Signed)
Addended by: Manuela Schwartz on: 09/02/2021 03:41 PM   Modules accepted: Orders

## 2021-09-03 ENCOUNTER — Telehealth: Payer: Self-pay

## 2021-09-03 ENCOUNTER — Encounter: Payer: Self-pay | Admitting: Nurse Practitioner

## 2021-09-03 ENCOUNTER — Ambulatory Visit: Payer: Medicare Other | Admitting: Nurse Practitioner

## 2021-09-03 VITALS — BP 120/74 | HR 92 | Temp 98.3°F | Ht 71.0 in | Wt 191.0 lb

## 2021-09-03 DIAGNOSIS — B9689 Other specified bacterial agents as the cause of diseases classified elsewhere: Secondary | ICD-10-CM | POA: Insufficient documentation

## 2021-09-03 DIAGNOSIS — U071 COVID-19: Secondary | ICD-10-CM

## 2021-09-03 DIAGNOSIS — J019 Acute sinusitis, unspecified: Secondary | ICD-10-CM

## 2021-09-03 DIAGNOSIS — J432 Centrilobular emphysema: Secondary | ICD-10-CM | POA: Diagnosis not present

## 2021-09-03 DIAGNOSIS — R4189 Other symptoms and signs involving cognitive functions and awareness: Secondary | ICD-10-CM | POA: Insufficient documentation

## 2021-09-03 DIAGNOSIS — J84112 Idiopathic pulmonary fibrosis: Secondary | ICD-10-CM | POA: Diagnosis not present

## 2021-09-03 DIAGNOSIS — I480 Paroxysmal atrial fibrillation: Secondary | ICD-10-CM

## 2021-09-03 DIAGNOSIS — Z09 Encounter for follow-up examination after completed treatment for conditions other than malignant neoplasm: Secondary | ICD-10-CM | POA: Insufficient documentation

## 2021-09-03 DIAGNOSIS — G4733 Obstructive sleep apnea (adult) (pediatric): Secondary | ICD-10-CM

## 2021-09-03 LAB — VITAMIN B12: Vitamin B-12: 832 pg/mL (ref 211–911)

## 2021-09-03 LAB — IRON: Iron: 83 ug/dL (ref 42–165)

## 2021-09-03 MED ORDER — PREDNISONE 20 MG PO TABS: 40.0000 mg | ORAL_TABLET | Freq: Every day | ORAL | 0 refills | Status: AC

## 2021-09-03 MED ORDER — STIOLTO RESPIMAT 2.5-2.5 MCG/ACT IN AERS: 2.0000 | INHALATION_SPRAY | Freq: Every day | RESPIRATORY_TRACT | 0 refills | Status: DC

## 2021-09-03 MED ORDER — DOXYCYCLINE HYCLATE 100 MG PO TABS: 100.0000 mg | ORAL_TABLET | Freq: Two times a day (BID) | ORAL | 0 refills | Status: AC

## 2021-09-03 NOTE — Assessment & Plan Note (Signed)
Clinically improving. Still with mild increase in DOE since having COVID. Workup negative for PE. Unclear etiology of syncope but suspected to be related to acute encephalopathy from dehydration due to viral illness. He has not had any further syncopal events. He is anticipated to see neurology in 2 weeks. 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

## 2021-09-03 NOTE — Assessment & Plan Note (Signed)
Progressive memory impairment and agitated outbursts. CT head and MRI brain did show some chronic ischemic changes. We discussed the possibility of underlying early dementia and recommended they discuss this in more detail when he sees neurology.

## 2021-09-03 NOTE — Assessment & Plan Note (Signed)
Recent COVID infection; now with worsening nasal congestion, purulent drainage, and sinus tenderness. Given this, advised empiric doxycycline course for concern for ABRS. Continue intranasal steroid with flonase. Add mucinex for congestion.

## 2021-09-03 NOTE — Assessment & Plan Note (Addendum)
Has not been on CPAP since having COVID. Did restart therapy last night and wore it for around 6 hours. We will check compliance at OV. He usually has excellent compliance and receives good benefit. Caution to monitor for drowsy driving.

## 2021-09-03 NOTE — Patient Instructions (Addendum)
Trial Stiolto 2 puffs daily for one month. If you notice an improvement in your breathing or activity tolerance, please let me know so I can send in a prescription  Continue Esbriet 801 mg Three times a day with food. Wear sunscreen when outside  Continue allegra 180 mg daily for allergies Continue flonase 2 sprays each nostril daily  Mucinex 600 mg Twice daily for congestion Doxycycline 1 tab Twice daily for 7 days. Take with food. Wear sunscreen when outside  Prednisone 40 mg (2 tabs) for 5 days. Take in AM with food.  Follow up as scheduled in July with Dr. Chase Caller. If symptoms do not improve or worsen, please contact office for sooner follow up or seek emergency care.

## 2021-09-03 NOTE — Telephone Encounter (Signed)
Patient's wife called stating she spoke with Urology and they agree'd to stop the silodosin , and they will contact her back with further instructions     Patient not taking flomax as of 10/2020

## 2021-09-03 NOTE — Assessment & Plan Note (Signed)
Regular rhythm and rate controlled at OV today. Chronically anticoagulated with Xarelto. Follow up with cardiology outpatient.

## 2021-09-03 NOTE — Assessment & Plan Note (Signed)
Mild emphysema on imaging with significant smoking history and progressive DOE over the past few months. Recommended trial of LABA/LAMA therapy with Stiolto - provided with samples today. Will re-evaluate obstructive defect on PFTs in July.

## 2021-09-03 NOTE — Progress Notes (Signed)
$'@Patient'Y$  ID: Robert Lang, male    DOB: 1950-06-17, 71 y.o.   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 He is a patient of Dr. Golden Lang and last seen in office on 07/14/2021 by Robert Napoleon NP. Past medical history significant for CAD, a flutter/fib on Xarelto, HTN, hx of STEMI, allergic rhinitis, GERD, DDD, depression, anxiety, ETOH abuse, HLD.  TEST/EVENTS:  07/06/2021 HRCT chest: There is atherosclerosis.  No LAD is present.  There is mild centrilobular emphysema.  There are mild basilar subpleural reticular densities, groundglass and traction bronchiectasis/bronchiolectasis, and probable UIP pattern and unchanged when compared to 04/2020. 08/25/2021 CTA chest: Limited evaluation of the pulmonary arteries beyond the proximal segmental level at the lung bases, particularly in the right lower lobe due to motion artifact.  Otherwise there is satisfactory opacification and no evidence of PE.  Fibrotic changes and subpleural nodule measuring 4 mm are unchanged when compared to previous imaging.  No acute process was identified. 09/02/2021 CXR 2 view: Minimal bibasilar fibrotic change.  No acute airspace opacity identified.  07/14/2021: Robert Lang with Robert Napoleon NP to review sleep study results and HRCT. DOE similar to when he was seen previously; more pronounced from baseline, possibly from deconditioning. PFTs were inconclusive for worsening disease, HRCT showed unchanged appearance of ILD since 04/2020 with mild centrilobular emphysema. Tolerating Esbriet well. Has been to pulmonary rehab in the past but no desire to return; not incredibly active; advised on exercise regimen. Consistent with CPAP use - download showed 100% compliance with AHI 1.3. Concern for vegetation on echocardiogram - scheduled follow up with cardiology in May.    09/03/2021: Today-follow-up Patient presents today with his wife for hospital follow-up.  He was recently diagnosed with COVID infection on 5/29 2023 and treated with Paxlovid course.  He then went into the emergency room at [facility] on 6/6 after having a syncopal event and fall at home.  Work-up was performed with CTA which was negative for PE and CT and MRI of the brain niether of which showed any acute process.  There was no clear etiology for his symptoms and suspected that it was possibly related to Paxlovid use.  He was discharged on 6/7 and recommended to follow-up with his pulmonologist outpatient.  After being home for just a brief time, he had another syncopal event.  His wife took him to [facility] emergency department and he was admitted from 08/26/2021 to 08/28/2021.  During his stay, he had some seizure-like activity.  Neurology was consulted and suspected that symptoms were more likely secondary to convulsive syncope.  EEG was unremarkable.  It was suspected that he had possible acute metabolic encephalopathy related to dehydration from recent COVID infection.  He was treated with IV fluids and monitored EKG.  He did have a bifascicular block noted on initial EKG which resolved without any recurrence.  He was recommended to follow-up with neurology outpatient.  Plans for Holter monitor if syncopal events persist; follow-up with cardiology outpatient.  He did not have any significant respiratory symptoms during his stay aside from cough and his baseline shortness of breath per the discharge summary.  Today, they report that he has been feeling okay since discharge.  Has not had any more dizziness or syncopal episodes since coming home.  He does feel like his breathing  has not entirely returned to his baseline since having COVID.  Still has an occasional cough which is primarily non-productive.  CXR from yesterday did not have any evidence superimposed infection.  Upon further discussion, he  feels like he has had progressive DOE over the last couple months. He denies any recent fevers, palpitations, leg swelling, weight loss, hemoptysis, calf pain/tenderness. He continues on Esbriet without any difficulties.  He has had persistent nasal drainage and congestion since his COVID infection; feels like it has actually gotten worse over the past week or so. His mucous is green. Using flonase, which helps some.    He is seeing neurology in a few weeks. We did discuss his anxiety symptoms and some of his anger outbursts. Inquired as to if he felt like he was having any memory issues, which his wife stated he frequently forgets things and gets agitated because of this. I advised them to discuss this with neurology as he does have some chronic ischemic changes on his imaging and could be early signs of dementia.   Allergies  Allergen Reactions   Naproxen Other (See Comments)    (Naprosyn *ANALGESICS - ANTI-INFLAMMATORY*) Nausea, Abdominal pain    Immunization History  Administered Date(s) Administered   Influenza Split 01/09/2010, 02/09/2011   Influenza, High Dose Seasonal PF 11/15/2018, 01/11/2020, 12/27/2020   Influenza,inj,Quad PF,6+ Mos 01/01/2015   Influenza-Unspecified 01/30/2014, 02/02/2016, 12/15/2020   PFIZER(Purple Top)SARS-COV-2 Vaccination 04/27/2019, 05/18/2019, 01/14/2020, 07/22/2020, 12/15/2020   Pneumococcal Conjugate-13 11/23/2018   Pneumococcal Polysaccharide-23 08/12/2020   Tdap 02/18/2011    Past Medical History:  Diagnosis Date   Acute ST elevation myocardial infarction (STEMI) of inferolateral wall ([place]) 01/10/2019   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 M75.00)   Allergic rhinitis due to pollen 09/03/2013   J30.1)  Formatting of this note might be different from the original. J30.1)   Allergy    Anticoagulated 07/01/2016   Anxiety    Arthritis    Atrial fibrillation ([employer])     Atrial flutter ([place]) 06/26/2015   CAD (coronary artery disease)    Chest pain 06/26/2015   COPD (chronic obstructive pulmonary disease) ([place])    Coronary artery disease 07/06/2018   Cardiac catheterization 2017 showing 90% small diagonal branch disease   DDD (degenerative disc disease), cervical 09/03/2013   M50.90)  Formatting of this note might be different from the original. M50.90)   Depression    Depression    Depressive disorder 09/03/2013   Dizziness 10/28/2017   Dyslipidemia, goal LDL below 70 07/06/2018   Dyspnea on exertion 10/20/2018   Esophageal reflux 09/03/2013   Essential hypertension 07/06/2018   ETOH abuse 01/11/2019   6 pack of beer per day   Falls 10/28/2017   GERD (gastroesophageal reflux disease)    H/O amiodarone therapy 07/01/2016   Heart attack ([place])    Heart palpitations 01/27/2017   History of colon polyps    Hyperlipidemia    Hypertension    IPF (idiopathic pulmonary fibrosis) ([place])    Kidney stones    Neck pain 09/03/2013   Neuropathy 07/06/2018   Obstructive sleep apnea 08/05/2015   PLMD (periodic limb movement disorder) 11/04/2015   Polycythemia, 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 STORY: Due to alcohol/ tobacco   Pure hypercholesterolemia 09/03/2013   E78.0)  Formatting of this note might be different from the original.  E78.0)   Screening for prostate cancer 09/03/2013   Smoker 01/11/2019   Smoking 07/06/2018   Status post ablation of atrial flutter 07/06/2018   2017    Tobacco History: Social History   Tobacco Use  Smoking Status Former   Packs/day: 1.00   Years: 30.00   Total pack years: 30.00   Types: Cigarettes   Quit date: 01/10/2019   Years since quitting: 2.6  Smokeless Tobacco Former   Counseling given: Not Answered   Outpatient Medications Prior to Visit  Medication Sig Dispense Refill   acetaminophen (TYLENOL) 325 MG tablet Take 2  tablets (650 mg total) by mouth every 6 (six) hours as needed. (Patient taking differently: Take 650 mg by mouth every 6 (six) hours as needed for mild pain or moderate pain.)     atorvastatin (LIPITOR) 80 MG tablet Take 1 tablet (80 mg total) by mouth at bedtime. 90 tablet 3   buPROPion (WELLBUTRIN XL) 300 MG 24 hr tablet Take 1 tablet (300 mg total) by mouth daily. 90 tablet 0   busPIRone (BUSPAR) 15 MG tablet Take 15 mg by mouth 2 (two) times daily.     Cholecalciferol 25 MCG (1000 UT) tablet Take 1,000 Units by mouth daily.      docusate sodium (COLACE) 100 MG capsule Take 100 mg by mouth daily.     famotidine (PEPCID) 40 MG tablet Take 40 mg by mouth at bedtime.     fexofenadine (ALLEGRA) 180 MG tablet Take 180 mg by mouth at bedtime.      finasteride (PROSCAR) 5 MG tablet Take 1 tablet (5 mg total) by mouth daily. (Patient taking differently: Take 5 mg by mouth at bedtime.) 90 tablet 1   fluticasone (FLONASE) 50 MCG/ACT nasal spray Place 1 spray into both nostrils daily as needed for allergies or rhinitis.     fluticasone (FLONASE) 50 MCG/ACT nasal spray SHAKE LIQUID AND USE 2 SPRAYS IN EACH NOSTRIL DAILY 48 g 2   gabapentin (NEURONTIN) 100 MG capsule TAKE 4 CAPSULES BY MOUTH AT BEDTIME (Patient taking differently: Take 100 mg by mouth at bedtime. TAKE 4 CAPSULES BY MOUTH AT BEDTIME) 120 capsule 3   Ginger, Zingiber officinalis, (GINGER ROOT) 550 MG CAPS Take 550 mg by mouth daily.     melatonin 5 MG TABS Take 10 mg by mouth at bedtime.     Multiple Vitamins-Minerals (PRESERVISION AREDS PO) Take 1 capsule by mouth in the morning and at bedtime.     nitroGLYCERIN (NITROSTAT) 0.4 MG SL tablet Place 1 tablet (0.4 mg total) under the tongue every 5 (five) minutes as needed for chest pain. 25 tablet 3   NON FORMULARY CPAP at bedtime     pantoprazole (PROTONIX) 40 MG tablet Take 1 tablet (40 mg total) by mouth daily. 90 tablet 3   Pirfenidone (ESBRIET) 801 MG TABS Take 801 mg by mouth 3 (three)  times daily. 270 tablet 1   Polyethylene Glycol 3350 (MIRALAX PO) Take 17 g by mouth daily.     ranolazine (RANEXA) 1000 MG SR tablet Take 1 tablet (1,000 mg total) by mouth 2 (two) times daily. 180 tablet 1   rivaroxaban (XARELTO) 20 MG TABS tablet TAKE 1 TABLET(20 MG) BY MOUTH DAILY WITH SUPPER (Patient taking differently: Take 20 mg by mouth daily with supper.) 30 tablet 5   sertraline (ZOLOFT) 100 MG tablet Take 100 mg by mouth at bedtime.     silodosin (RAPAFLO) 8 MG CAPS capsule Take 8 mg by mouth at  bedtime.     traZODone (DESYREL) 50 MG tablet TAKE 1/2 TO 1 TABLET(25 TO 50 MG) BY MOUTH AT BEDTIME AS NEEDED FOR SLEEP 30 tablet 3   vitamin B-12 (CYANOCOBALAMIN) 1000 MCG tablet Take 1,000 mcg by mouth daily.     ondansetron (ZOFRAN) 4 MG tablet TAKE 1 TABLET(4 MG) BY MOUTH EVERY 8 HOURS AS NEEDED FOR NAUSEA OR VOMITING (Patient not taking: Reported on 09/03/2021) 20 tablet 0   No facility-administered medications prior to visit.     Review of Systems:   Constitutional: No weight loss or gain, night sweats, fevers, chills. +fatigue (chronic) HEENT: No headaches, difficulty swallowing, tooth/dental problems, or sore throat. No sneezing, itching, ear ache. + nasal congestion, purulent drainage, sinus tenderness CV:  +dizziness (resolved). No chest pain, orthopnea, PND, swelling in lower extremities, anasarca, palpitations Resp: +shortness of breath with exertion; minimal cough. No excess mucus or change in color of mucus. No hemoptysis. No wheezing.  No chest wall deformity GI:  +occasional mild nausea with Esbriet. No heartburn, indigestion, abdominal pain, vomiting, diarrhea, change in bowel habits, loss of appetite, bloody stools.  Skin: No rash, lesions, ulcerations MSK:  No joint pain or swelling.  No decreased range of motion.  No back pain. Neuro: +memory loss; recent syncopal events. Uses walker Psych: +anxiety/agitation. No SI/HI.     Physical Exam:  BP 120/74 (BP Location:  Right Arm, Patient Position: Sitting, Cuff Size: Normal)   Pulse 92   Temp 98.3 F (36.8 C) (Oral)   Ht '5\' 11"'$  (1.803 m)   Wt 191 lb (86.6 kg)   SpO2 95%   BMI 26.64 kg/m   GEN: Pleasant, interactive, chronically-ill appearing; in no acute distress. HEENT:  Normocephalic and atraumatic. EACs patent bilaterally. TM pearly gray with present light reflex bilaterally. PERRLA. Sclera white. Nasal turbinates erythematous, moist and patent bilaterally. Yellow rhinorrhea present. Oropharynx erythematous and moist, without exudate or edema. Sinus tenderness upon palpation. No lesions, ulcerations.  NECK:  Supple w/ fair ROM. No JVD present. Normal carotid impulses w/o bruits. Thyroid symmetrical with no goiter or nodules palpated. No lymphadenopathy.   CV: RRR, no m/r/g, no peripheral edema. Pulses intact, +2 bilaterally. No cyanosis, pallor or clubbing. PULMONARY:  Unlabored, regular breathing. Bibasilar crackles; otherwise clear bilaterally A&P w/o wheezes/rales/rhonchi. No accessory muscle use. No dullness to percussion. GI: BS present and normoactive. Soft, non-tender to palpation. No organomegaly or masses detected. No CVA tenderness. MSK: No erythema, warmth or tenderness. Cap refil <2 sec all extrem. No deformities or joint swelling noted.  Neuro: A/Ox3. No focal deficits noted.   Skin: Warm, no lesions or rashe Psych: Normal affect and behavior. Judgement and thought content appropriate.     Lab Results:  CBC    Component Value Date/Time   WBC 7.4 09/02/2021 1541   RBC 4.38 09/02/2021 1541   HGB 14.9 09/02/2021 1541   HGB 15.9 07/28/2021 1033   HCT 42.4 09/02/2021 1541   HCT 47.4 07/28/2021 1033   PLT 278 09/02/2021 1541   PLT 243 07/28/2021 1033   MCV 96.8 09/02/2021 1541   MCV 98 (H) 07/28/2021 1033   MCH 34.0 (H) 09/02/2021 1541   MCHC 35.1 09/02/2021 1541   RDW 12.8 09/02/2021 1541   RDW 12.7 07/28/2021 1033   LYMPHSABS 2,620 09/02/2021 1541   LYMPHSABS 1.5 07/28/2021  1033   MONOABS 0.9 08/26/2021 1826   EOSABS 0 (L) 09/02/2021 1541   EOSABS 0.0 07/28/2021 1033   BASOSABS 81 09/02/2021 1541  BASOSABS 0.0 07/28/2021 1033    BMET    Component Value Date/Time   NA 136 09/02/2021 1541   NA 137 03/05/2020 1343   K 4.7 09/02/2021 1541   CL 101 09/02/2021 1541   CO2 27 09/02/2021 1541   GLUCOSE 84 09/02/2021 1541   BUN 14 09/02/2021 1541   BUN 14 03/05/2020 1343   CREATININE 0.92 09/02/2021 1541   CALCIUM 9.7 09/02/2021 1541   GFRNONAA >60 08/28/2021 0443   GFRAA 81 03/05/2020 1343    BNP    Component Value Date/Time   BNP 37 09/02/2021 1541     Imaging:  DG Chest 2 View  Result Date: 09/02/2021 CLINICAL DATA:  IPF, dyspnea on exertion EXAM: CHEST - 2 VIEW COMPARISON:  08/26/2021 FINDINGS: The heart size and mediastinal contours are within normal limits. Minimal bibasilar fibrotic change. No acute airspace opacity. Disc degenerative disease of the thoracic spine. IMPRESSION: Minimal bibasilar fibrotic change.  No acute airspace opacity. Electronically Signed   By: Delanna Ahmadi M.D.   On: 09/02/2021 16:13   ECHOCARDIOGRAM COMPLETE  Result Date: 08/28/2021    ECHOCARDIOGRAM REPORT   Patient Name:   Robert Lang Date of Exam: 08/28/2021 Medical Rec #:  [id number]         Height:       71.0 in Accession #:    [id number]        Weight:       191.8 lb Date of Birth:  1951-03-17          BSA:          2.071 m Patient Age:    30 years          BP:           142/82 mmHg Patient Gender: M                 HR:           92 bpm. Exam Location:  Inpatient Procedure: 2D Echo, Cardiac Doppler, Color Doppler and Intracardiac            Opacification Agent Indications:    Cardiomyopathy  History:        Patient has prior history of Echocardiogram examinations, most                 recent 07/08/2021. COPD, Arrythmias:Atrial Flutter,                 Signs/Symptoms:Syncope; Risk Factors:Sleep Apnea, Hypertension,                 Dyslipidemia and Former Smoker. S/P  ablation.  Sonographer:    Clayton Lefort RDCS (AE) Referring Phys: [id number] [facility]  Sonographer Comments: Technically difficult study due to poor echo windows. IMPRESSIONS  1. Left ventricular ejection fraction, by estimation, is 55%. The left ventricle has normal function. The left ventricle has no regional wall motion abnormalities. There is mild left ventricular hypertrophy. Left ventricular diastolic parameters are consistent with Grade I diastolic dysfunction (impaired relaxation).  2. Right ventricular systolic function is normal. The right ventricular size is mildly enlarged. Tricuspid regurgitation signal is inadequate for assessing PA pressure.  3. The mitral valve is normal in structure. No evidence of mitral valve regurgitation. No evidence of mitral stenosis.  4. The aortic valve is tricuspid. There is mild calcification of the aortic valve. Aortic valve regurgitation is not visualized. No aortic stenosis is present.  5. The inferior vena cava is normal in  size with greater than 50% respiratory variability, suggesting right atrial pressure of 3 mmHg.  6. Technically difficult study with poor acoustic windows. FINDINGS  Left Ventricle: Left ventricular ejection fraction, by estimation, is 55%. The left ventricle has normal function. The left ventricle has no regional wall motion abnormalities. Definity contrast agent was given IV to delineate the left ventricular endocardial borders. The left ventricular internal cavity size was normal in size. There is mild left ventricular hypertrophy. Left ventricular diastolic parameters are consistent with Grade I diastolic dysfunction (impaired relaxation). Right Ventricle: The right ventricular size is mildly enlarged. No increase in right ventricular wall thickness. Right ventricular systolic function is normal. Tricuspid regurgitation signal is inadequate for assessing PA pressure. Left [facility]: Left atrial size was normal in size. Right [facility]: Right atrial  size was normal in size. Pericardium: There is no evidence of pericardial effusion. Mitral Valve: The mitral valve is normal in structure. Mild mitral annular calcification. No evidence of mitral valve regurgitation. No evidence of mitral valve stenosis. Tricuspid Valve: The tricuspid valve is normal in structure. Tricuspid valve regurgitation is not demonstrated. Aortic Valve: The aortic valve is tricuspid. There is mild calcification of the aortic valve. Aortic valve regurgitation is not visualized. No aortic stenosis is present. Aortic valve mean gradient measures 2.0 mmHg. Aortic valve peak gradient measures 4.0 mmHg. Aortic valve area, by VTI measures 3.39 cm. Pulmonic Valve: The pulmonic valve was normal in structure. Pulmonic valve regurgitation is trivial. Aorta: The aortic root is normal in size and structure. Venous: The inferior vena cava is normal in size with greater than 50% respiratory variability, suggesting right atrial pressure of 3 mmHg. IAS/Shunts: No atrial level shunt detected by color flow Doppler.  LEFT VENTRICLE PLAX 2D LVIDd:         4.50 cm LVIDs:         3.30 cm LV PW:         1.40 cm LV IVS:        1.30 cm LVOT diam:     2.20 cm LV SV:         51 LV SV Index:   24 LVOT Area:     3.80 cm  RIGHT VENTRICLE             IVC RV Basal diam:  3.40 cm     IVC diam: 1.30 cm RV S prime:     14.10 cm/s TAPSE (M-mode): 2.2 cm LEFT ATRIUM             Index        RIGHT ATRIUM           Index LA diam:        3.80 cm 1.83 cm/m   RA Area:     17.60 cm LA Vol (A2C):   48.5 ml 23.41 ml/m  RA Volume:   48.80 ml  23.56 ml/m LA Vol (A4C):   32.3 ml 15.59 ml/m LA Biplane Vol: 41.3 ml 19.94 ml/m  AORTIC VALVE AV Area (Vmax):    3.39 cm AV Area (Vmean):   3.38 cm AV Area (VTI):     3.39 cm AV Vmax:           99.90 cm/s AV Vmean:          66.600 cm/s AV VTI:            0.149 m AV Peak Grad:      4.0 mmHg AV Mean Grad:  2.0 mmHg LVOT Vmax:         89.20 cm/s LVOT Vmean:        59.200 cm/s LVOT VTI:           0.133 m LVOT/AV VTI ratio: 0.89  AORTA Ao Root diam: 3.10 cm Ao Asc diam:  3.70 cm  SHUNTS Systemic VTI:  0.13 m Systemic Diam: 2.20 cm Dalton McleanMD Electronically signed by Franki Monte Signature Date/Time: 08/28/2021/4:38:59 PM    Final    Overnight EEG with video  Result Date: 08/28/2021 Lora Havens, MD     08/28/2021  1:47 PM Patient Name: Robert Lang MRN: [id number] Epilepsy Attending: Lora Havens Referring Physician/Provider: Lesleigh Noe L Duration: 08/27/2021 1741 to 08/28/2021 1255  Patient history: 71yo M with episodes of LOC. EEG to evaluate for seizure  Level of alertness: Awake, asleep  AEDs during EEG study: None  Technical aspects: This EEG study was done with scalp electrodes positioned according to the 10-20 International system of electrode placement. Electrical activity was acquired at a sampling rate of '500Hz'$  and reviewed with a high frequency filter of '70Hz'$  and a low frequency filter of '1Hz'$ . EEG data were recorded continuously and digitally stored.  Description: The posterior dominant rhythm consists of 9-10 Hz activity of moderate voltage (25-35 uV) seen predominantly in posterior head regions, symmetric and reactive to eye opening and eye closing.  Sleep was characterized by vertex's, sleep spindles (12-14), maximal frontocentral region. Hyperventilation and photic stimulation were not performed.    IMPRESSION: This study is within normal limits. No seizures or epileptiform discharges were seen throughout the recording. Lora Havens   EEG adult  Result Date: 08/27/2021 Lora Havens, MD     08/27/2021 11:07 AM Patient Name: Robert Lang MRN: [id number] Epilepsy Attending: Lora Havens Referring Physician/Provider: Lorenza Chick Date: 08/27/2021 Duration: 25 mins Patient history: 71yo M with episodes of LOC. EEG to evaluate for seizure Level of alertness: Awake AEDs during EEG study: None Technical aspects: This EEG study was done with scalp  electrodes positioned according to the 10-20 International system of electrode placement. Electrical activity was acquired at a sampling rate of '500Hz'$  and reviewed with a high frequency filter of '70Hz'$  and a low frequency filter of '1Hz'$ . EEG data were recorded continuously and digitally stored. Description: The posterior dominant rhythm consists of 9-10 Hz activity of moderate voltage (25-35 uV) seen predominantly in posterior head regions, symmetric and reactive to eye opening and eye closing. Hyperventilation and photic stimulation were not performed.   IMPRESSION: This study is within normal limits. No seizures or epileptiform discharges were seen throughout the recording. [place]   CT HEAD WO CONTRAST (5MM)  Result Date: 08/26/2021 CLINICAL DATA:  Delirium EXAM: CT HEAD WITHOUT CONTRAST TECHNIQUE: Contiguous axial images were obtained from the base of the skull through the vertex without intravenous contrast. RADIATION DOSE REDUCTION: This exam was performed according to the departmental dose-optimization program which includes automated exposure control, adjustment of the mA and/or kV according to patient size and/or use of iterative reconstruction technique. COMPARISON:  CT brain 08/25/2021 FINDINGS: Brain: No acute territorial infarction, hemorrhage, or intracranial mass. 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 Mild chronic small vessel ischemic changes of the white matter. Stable ventricle size Vascular: No hyperdense vessels. Carotid and vertebral vascular calcification Skull: Normal. Negative for fracture or focal lesion. Sinuses/Orbits: Fluid levels in the maxillary sinuses with mucosal thickening. Other: None IMPRESSION: 1. No CT evidence  for acute intracranial abnormality. 2. Atrophy and chronic small vessel ischemic changes of the white matter. Electronically Signed   By: Donavan Foil M.D.   On: 08/26/2021 23:45   DG Chest 2 View  Result Date: 08/26/2021 CLINICAL  DATA:  Weakness. EXAM: CHEST - 2 VIEW COMPARISON:  Chest x-ray 10/19/2019. FINDINGS: The heart size and mediastinal contours are within normal limits. Both lungs are clear. The visualized skeletal structures are unremarkable. IMPRESSION: No active cardiopulmonary disease. Electronically Signed   By: Ronney Asters M.D.   On: 08/26/2021 19:02         Latest Ref Rng & Units 06/23/2021    9:23 AM 02/19/2021   11:47 AM 11/18/2020    9:04 AM 05/09/2020    8:46 AM  PFT Results  FVC-Pre L 3.48  3.49  3.33  3.21   FVC-Predicted Pre % 76  75  72  69   FVC-Post L    3.38   FVC-Predicted Post %    73   Pre FEV1/FVC % % 76  73  79  81   Post FEV1/FCV % %    76   FEV1-Pre L 2.64  2.56  2.63  2.60   FEV1-Predicted Pre % 79  75  77  76   FEV1-Post L    2.55   DLCO uncorrected ml/min/mmHg 17.94  20.29  19.95  22.07   DLCO UNC% % 67  75  74  82   DLCO corrected ml/min/mmHg 17.94  20.29  19.95  21.66   DLCO COR %Predicted % 67  75  74  81   DLVA Predicted % 79  81  79  100   TLC L    7.61   TLC % Predicted %    105   RV % Predicted %    139     No results found for: "NITRICOXIDE"      Assessment & Plan:   IPF (idiopathic pulmonary fibrosis) (HCC) Possible fibrotic flare with recent COVID infection. Recent imaging without evidence of superimposed infection. Will treat with prednisone burst. Tolerating antifibrotic therapy well with recent stable labs. Plans for repeat PFTs in July. Recent CT without evidence for progression. He is also at increased risk for PAH with his hx; recent echo with mildly enlarged RV and unable to measure PASP. Assess DLCO with follow up PFTs. If unexplained worsening of DOE or decline in DLCO, may need to consider right heart cath.   Patient Instructions  Trial Stiolto 2 puffs daily for one month. If you notice an improvement in your breathing or activity tolerance, please let me know so I can send in a prescription  Continue Esbriet 801 mg Three times a day with food.  Wear sunscreen when outside  Continue allegra 180 mg daily for allergies Continue flonase 2 sprays each nostril daily  Mucinex 600 mg Twice daily for congestion Doxycycline 1 tab Twice daily for 7 days. Take with food. Wear sunscreen when outside  Prednisone 40 mg (2 tabs) for 5 days. Take in AM with food.  Follow up as scheduled in July with Dr. Chase Caller. If symptoms do not improve or worsen, please contact office for sooner follow up or seek emergency care.    Centrilobular emphysema (HCC) Mild emphysema on imaging with significant smoking history and progressive DOE over the past few months. Recommended trial of LABA/LAMA therapy with Stiolto - provided with samples today. Will re-evaluate obstructive defect on PFTs in July.  Acute bacterial rhinosinusitis  Recent COVID infection; now with worsening nasal congestion, purulent drainage, and sinus tenderness. Given this, advised empiric doxycycline course for concern for ABRS. Continue intranasal steroid with flonase. Add mucinex for congestion.   Obstructive sleep apnea Has not been on CPAP since having COVID. Did restart therapy last night and wore it for around 6 hours. We will check compliance at OV. He usually has excellent compliance and receives good benefit. Caution to monitor for drowsy driving.  Hospital discharge follow-up Clinically improving. Still with mild increase in DOE since having COVID. Workup negative for PE. Unclear etiology of syncope but suspected to be related to acute encephalopathy from dehydration due to viral illness. He has not had any further syncopal events. He is anticipated to see neurology in 2 weeks. 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   Cognitive changes Progressive memory impairment and agitated outbursts. CT head and MRI brain did show some chronic ischemic changes. We discussed the possibility of underlying early  dementia and recommended they discuss this in more detail when he sees neurology.   Atrial fibrillation (HCC) Regular rhythm and rate controlled at OV today. Chronically anticoagulated with Xarelto. Follow up with cardiology outpatient.   I spent 45 minutes of dedicated to the care of this patient on the date of this encounter to include pre-visit review of records, face-to-face time with the patient discussing conditions above, post visit ordering of testing, clinical documentation with the electronic health record, making appropriate referrals as documented, and communicating necessary findings to members of the patients care team.  Clayton Bibles, NP 09/03/2021  Pt aware and understands NP's role.

## 2021-09-03 NOTE — Assessment & Plan Note (Addendum)
Possible fibrotic flare with recent COVID infection. Recent imaging without evidence of superimposed infection. Will treat with prednisone burst. Tolerating antifibrotic therapy well with recent stable labs. Plans for repeat PFTs in July. Recent CT without evidence for progression. He is also at increased risk for PAH with his hx; recent echo with mildly enlarged RV and unable to measure PASP. Assess DLCO with follow up PFTs. If unexplained worsening of DOE or decline in DLCO, may need to consider right heart cath.   Patient Instructions  Trial Stiolto 2 puffs daily for one month. If you notice an improvement in your breathing or activity tolerance, please let me know so I can send in a prescription  Continue Esbriet 801 mg Three times a day with food. Wear sunscreen when outside  Continue allegra 180 mg daily for allergies Continue flonase 2 sprays each nostril daily  Mucinex 600 mg Twice daily for congestion Doxycycline 1 tab Twice daily for 7 days. Take with food. Wear sunscreen when outside  Prednisone 40 mg (2 tabs) for 5 days. Take in AM with food.  Follow up as scheduled in July with Dr. Chase Caller. If symptoms do not improve or worsen, please contact office for sooner follow up or seek emergency care.

## 2021-09-06 ENCOUNTER — Other Ambulatory Visit: Payer: Self-pay | Admitting: Cardiology

## 2021-09-06 NOTE — Progress Notes (Signed)
Office Visit    Patient Name: Robert Lang Date of Encounter: 09/07/2021  PCP:  [name], Edward, [place]  Cardiologist:  Jenne Campus, MD  Advanced Practice Provider:  No care team member to display Electrophysiologist:  None    HPI    Robert Lang is a 71 y.o. male with a hx of CAD status post PCI to LAD/RCA, atrial flutter status post ablation 2017, HLD, HTN presents today for follow-up.    LHC at [facility] 2017 with patent stent in proximal LAD, 90% occlusion and small first diagonal artery and 25% occlusion in RCA with good EF 60%.  Atrial flutter ablation at [facility] in 2017.  Stress test 10/30/2018 with no perfusion defect described as a low risk study.  He has a history of code STEMI back at the med center [facility] ED 01/10/2019.  Transferred to [facility].  Troponin peaked at 12,883.  Echocardiogram showed preserved LV function.  Underwent cardiac catheterization 01/10/2019 with PCI and DES to RCA.  Recommended triple therapy for 1 month and then stop aspirin.  He did have recurrent chest pain post cath notable for pericardial inflammation/post infarct pericarditis.  He was started on colchicine.  He followed up 07/28/2021 in the office to discuss recent echocardiogram.  Echocardiogram surprisingly showed some potential old vegetation on the aortic valve.  Valve was competent.  There was no aortic insufficiency or aortic stenosis.  There was some calcification of the valve.  He did not have any signs or symptoms of endocarditis and did not have fever/chills.  He had some shortness of breath which was consistent with his history of idiopathic pulmonary fibrosis.  He was not a candidate for TEE due to his IPF and low blood pressure which would be high risk for this procedure.  Echocardiogram from 08/28/2021 does not mention an aortic valve vegetation.  Grade 1 diastolic dysfunction, normal left ejection fraction of 55%.   Today, he shares with me that  he was recently in the hospital for syncope x 2. Both times it was thought to be due to dehydration. Head CT and EEG was negative. Labs showed low potassium and sodium per patient. An echo was performed and results are above. His other symptoms include weakness in his legs. No palpitations or skipped heart beats. BP low normal today. I encouraged him to continue to take his BP at home and we discussed maintaining hydration. Repeat labs when he sees Dr. Agustin Cree next month.   Reports no shortness of breath nor dyspnea on exertion. Reports no chest pain, pressure, or tightness. No edema, orthopnea, PND. Reports no palpitations.    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 M75.00)   Allergic rhinitis due to pollen 09/03/2013   J30.1)  Formatting of this note might be different from the original. J30.1)   Allergy    Anticoagulated 07/01/2016   Anxiety    Arthritis    Atrial fibrillation ([place])    Atrial flutter ([place]) 06/26/2015   CAD (coronary artery disease)    Chest pain 06/26/2015   COPD (chronic obstructive pulmonary disease) ([place])    Coronary artery disease 07/06/2018   Cardiac catheterization 2017 showing 90% small diagonal branch disease   DDD (degenerative disc disease), cervical 09/03/2013  M50.90)  Formatting of this note might be different from the original. M50.90)   Depression    Depression    Depressive disorder 09/03/2013   Dizziness 10/28/2017   Dyslipidemia, goal LDL below 70 07/06/2018   Dyspnea on exertion 10/20/2018   Esophageal reflux 09/03/2013   Essential hypertension 07/06/2018   ETOH abuse 01/11/2019   6 pack of beer per day   Falls 10/28/2017   GERD (gastroesophageal reflux disease)    H/O amiodarone therapy 07/01/2016   Heart  attack ([place])    Heart palpitations 01/27/2017   History of colon polyps    Hyperlipidemia    Hypertension    IPF (idiopathic pulmonary fibrosis) ([place])    Kidney stones    Neck pain 09/03/2013   Neuropathy 07/06/2018   Obstructive sleep apnea 08/05/2015   PLMD (periodic limb movement disorder) 11/04/2015   Polycythemia, 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 STORY: Due to alcohol/ tobacco   Pure hypercholesterolemia 09/03/2013   E78.0)  Formatting of this note might be different from the original. E78.0)   Screening for prostate cancer 09/03/2013   Smoker 01/11/2019   Smoking 07/06/2018   Status post ablation of atrial flutter 07/06/2018   2017   Past Surgical History:  Procedure Laterality Date   ATRIAL FIBRILLATION ABLATION  10/2015   BACK SURGERY     CARDIOVERSION  2017   CATARACT EXTRACTION Bilateral 2017   March and April 2017   COLONOSCOPY  2018   CORONARY STENT PLACEMENT  2012   CORONARY/GRAFT ACUTE MI REVASCULARIZATION N/A 01/10/2019   Procedure: CORONARY/GRAFT ACUTE MI REVASCULARIZATION;  Surgeon: Leonie Man, MD;  Location: [facility];  Service: Cardiovascular;  Laterality: N/A;   INGUINAL HERNIA REPAIR     over 20 years ago   LAPAROSCOPIC APPENDECTOMY N/A 01/17/2021   Procedure: APPENDECTOMY LAPAROSCOPIC;  Surgeon: Felicie Morn, MD;  Location: [place];  Service: General;  Laterality: N/A;   LEFT HEART CATH AND CORONARY ANGIOGRAPHY N/A 01/10/2019   Procedure: LEFT HEART CATH AND CORONARY ANGIOGRAPHY;  Surgeon: Leonie Man, MD;  Location: [facility];  Service: Cardiovascular;  Laterality: N/A;   LUMBAR LAMINECTOMY Bilateral 04/05/2016   L2-L5    VENTRAL HERNIA REPAIR  2018    Allergies  Allergies  Allergen Reactions   Naproxen Other (See Comments)    (Naprosyn *ANALGESICS - ANTI-INFLAMMATORY*) Nausea, Abdominal pain      EKGs/Labs/Other Studies  Reviewed:   The following studies were reviewed today:  08/28/2021 ECHOCARDIOGRAM  IMPRESSIONS     1. Left ventricular ejection fraction, by estimation, is 55%. The left  ventricle has normal function. The left ventricle has no regional wall  motion abnormalities. There is mild left ventricular hypertrophy. Left  ventricular diastolic parameters are  consistent with Grade I diastolic dysfunction (impaired relaxation).   2. Right ventricular systolic function is normal. The right ventricular  size is mildly enlarged. Tricuspid regurgitation signal is inadequate for  assessing PA pressure.   3. The mitral valve is normal in structure. No evidence of mitral valve  regurgitation. No evidence of mitral stenosis.   4. The aortic valve is tricuspid. There is mild calcification of the  aortic valve. Aortic valve regurgitation is not visualized. No aortic  stenosis is present.   5. The inferior vena cava is normal in size with greater than 50%  respiratory variability, suggesting right atrial pressure of 3  mmHg.   6. Technically difficult study with poor acoustic windows.   FINDINGS   Left Ventricle: Left ventricular ejection fraction, by estimation, is  55%. The left ventricle has normal function. The left ventricle has no  regional wall motion abnormalities. Definity contrast agent was given IV  to delineate the left ventricular  endocardial borders. The left ventricular internal cavity size was normal  in size. There is mild left ventricular hypertrophy. Left ventricular  diastolic parameters are consistent with Grade I diastolic dysfunction  (impaired relaxation).   Right Ventricle: The right ventricular size is mildly enlarged. No  increase in right ventricular wall thickness. Right ventricular systolic  function is normal. Tricuspid regurgitation signal is inadequate for  assessing PA pressure.   Left Atrium: Left atrial size was normal in size.   Right Atrium: Right atrial size  was normal in size.   Pericardium: There is no evidence of pericardial effusion.   Mitral Valve: The mitral valve is normal in structure. Mild mitral annular  calcification. No evidence of mitral valve regurgitation. No evidence of  mitral valve stenosis.   Tricuspid Valve: The tricuspid valve is normal in structure. Tricuspid  valve regurgitation is not demonstrated.   Aortic Valve: The aortic valve is tricuspid. There is mild calcification  of the aortic valve. Aortic valve regurgitation is not visualized. No  aortic stenosis is present. Aortic valve mean gradient measures 2.0 mmHg.  Aortic valve peak gradient measures  4.0 mmHg. Aortic valve area, by VTI measures 3.39 cm.   Pulmonic Valve: The pulmonic valve was normal in structure. Pulmonic valve  regurgitation is trivial.   Aorta: The aortic root is normal in size and structure.   Venous: The inferior vena cava is normal in size with greater than 50%  respiratory variability, suggesting right atrial pressure of 3 mmHg.   IAS/Shunts: No atrial level shunt detected by color flow Doppler.   07/08/2021 Echocardiogram   IMPRESSIONS     1. Left ventricular ejection fraction, by estimation, is 60 to 65%. The  left ventricle has normal function. The left ventricle has no regional  wall motion abnormalities. There is mild left ventricular hypertrophy.  Left ventricular diastolic parameters  are consistent with Grade I diastolic dysfunction (impaired relaxation).   2. Right ventricular systolic function is normal. The right ventricular  size is normal. There is normal pulmonary artery systolic pressure. The  estimated right ventricular systolic pressure is 67.8 mmHg.   3. The mitral valve is grossly normal. Mild mitral valve regurgitation.   4. There is calcification and a small mobile mass associated with the  left or non-coronary cusp seen prolapsing across the valve - this could  suggest sequelae of prior  vegetation/thrombus. The non-coronary cusp is  calcified.. The aortic valve is  tricuspid. Aortic valve regurgitation is not visualized. Aortic valve  sclerosis is present, with no evidence of aortic valve stenosis.   5. The inferior vena cava is normal in size with greater than 50%  respiratory variability, suggesting right atrial pressure of 3 mmHg.   Comparison(s): Changes from prior study are noted. 03/12/2020: LVEF  55-60%, calcified non-coronary cusp.   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 Consider a  Transesophageal Echocardiogram for further evaluation if clinically  indicated.     FINDINGS   Left Ventricle: Left ventricular ejection fraction, by estimation, is 60  to 65%. The left ventricle has normal function. The left ventricle has no  regional wall motion abnormalities. The left ventricular internal cavity  size was normal in size. There is   mild left ventricular hypertrophy. Left ventricular diastolic parameters  are consistent with Grade I diastolic dysfunction (impaired relaxation).  Indeterminate filling pressures.   Right Ventricle: The right ventricular size is normal. No increase in  right ventricular wall thickness. Right ventricular systolic function is  normal. There is normal pulmonary artery systolic pressure. The tricuspid  regurgitant velocity is 2.20 m/s, and   with an assumed right atrial pressure of 3 mmHg, the estimated right  ventricular systolic pressure is 29.5 mmHg.   Left Atrium: Left atrial size was normal in size.   Right Atrium: Right atrial size was normal in size. Prominent Eustachian  valve.   Pericardium: There is no evidence of pericardial effusion.   Mitral Valve: The mitral valve is grossly normal. Mild mitral valve  regurgitation.   Tricuspid Valve: The tricuspid valve is grossly normal. Tricuspid valve  regurgitation is trivial.    Aortic Valve: There is calcification and a small mobile mass associated  with the left or non-coronary cusp seen prolapsing across the valve - this  could suggest sequelae of prior vegetation/thrombus. The non-coronary cusp  is calcified. The aortic valve  is tricuspid. Aortic valve regurgitation is not visualized. Aortic valve  sclerosis is present, with no evidence of aortic valve stenosis.   Pulmonic Valve: The pulmonic valve was normal in structure. Pulmonic valve  regurgitation is not visualized.   Aorta: The aortic root and ascending aorta are structurally normal, with  no evidence of dilitation.   Venous: The inferior vena cava is normal in size with greater than 50%  respiratory variability, suggesting right atrial pressure of 3 mmHg.   IAS/Shunts: No atrial level shunt detected by color flow Doppler.   EKG:  EKG is not ordered today.  The ekg ordered today demonstrates   Recent Labs: 01/16/2021: TSH 1.232 08/26/2021: Magnesium 2.2 09/02/2021: ALT 20; Brain Natriuretic Peptide 37; BUN 14; Creat 0.92; Hemoglobin 14.9; Platelets 278; Potassium 4.7; Sodium 136  Recent Lipid Panel    Component Value Date/Time   CHOL 138 10/13/2020 0923   CHOL 128 04/03/2020 0951   TRIG 380.0 (H) 10/13/2020 0923   HDL 28.60 (L) 10/13/2020 0923   HDL 40 04/03/2020 0951   CHOLHDL 5 10/13/2020 0923   VLDL 76.0 (H) 10/13/2020 0923   LDLCALC 58 04/03/2020 0951   LDLDIRECT 66.0 10/13/2020 0923    Risk Assessment/Calculations:   CHA2DS2-VASc Score = 3   This indicates a 3.2% annual risk of stroke. The patient's score is based upon: CHF History: 0 HTN History: 1 Diabetes History: 0 Stroke History: 0 Vascular Disease History: 1 Age Score: 1 Gender Score: 0     Home Medications   Current Meds  Medication Sig   acetaminophen (TYLENOL) 325 MG tablet Take 2 tablets (650 mg total) by mouth every 6 (six) hours as needed.   atorvastatin (LIPITOR) 80 MG tablet Take 1 tablet (80 mg total)  by mouth at bedtime.   buPROPion (WELLBUTRIN XL) 300 MG 24 hr tablet Take 1 tablet (300 mg total) by mouth daily.   busPIRone (BUSPAR) 15 MG tablet Take 15 mg by mouth 2 (two) times daily.   Cholecalciferol 25 MCG (1000 UT) tablet Take 1,000 Units by mouth daily.    docusate sodium (COLACE) 100 MG capsule Take 100 mg by mouth daily.   doxycycline (VIBRA-TABS) 100 MG tablet Take 1 tablet (100 mg total) by mouth  2 (two) times daily for 7 days.   famotidine (PEPCID) 40 MG tablet Take 40 mg by mouth at bedtime.   fexofenadine (ALLEGRA) 180 MG tablet Take 180 mg by mouth at bedtime.    finasteride (PROSCAR) 5 MG tablet Take 1 tablet (5 mg total) by mouth daily.   fluticasone (FLONASE) 50 MCG/ACT nasal spray Place 1 spray into both nostrils daily as needed for allergies or rhinitis.   gabapentin (NEURONTIN) 100 MG capsule TAKE 4 CAPSULES BY MOUTH AT BEDTIME   Ginger, Zingiber officinalis, (GINGER ROOT) 550 MG CAPS Take 550 mg by mouth daily.   melatonin 5 MG TABS Take 10 mg by mouth at bedtime.   Multiple Vitamins-Minerals (PRESERVISION AREDS PO) Take 1 capsule by mouth in the morning and at bedtime.   nitroGLYCERIN (NITROSTAT) 0.4 MG SL tablet Place 1 tablet (0.4 mg total) under the tongue every 5 (five) minutes as needed for chest pain.   NON FORMULARY CPAP at bedtime   ondansetron (ZOFRAN) 4 MG tablet TAKE 1 TABLET(4 MG) BY MOUTH EVERY 8 HOURS AS NEEDED FOR NAUSEA OR VOMITING   pantoprazole (PROTONIX) 40 MG tablet Take 1 tablet (40 mg total) by mouth daily.   Pirfenidone (ESBRIET) 801 MG TABS Take 801 mg by mouth 3 (three) times daily.   Polyethylene Glycol 3350 (MIRALAX PO) Take 17 g by mouth daily.   predniSONE (DELTASONE) 20 MG tablet Take 2 tablets (40 mg total) by mouth daily with breakfast for 5 days.   ranolazine (RANEXA) 1000 MG SR tablet Take 1 tablet (1,000 mg total) by mouth 2 (two) times daily.   rivaroxaban (XARELTO) 20 MG TABS tablet TAKE 1 TABLET(20 MG) BY MOUTH DAILY WITH SUPPER    sertraline (ZOLOFT) 100 MG tablet Take 100 mg by mouth at bedtime.   Tiotropium Bromide-Olodaterol (STIOLTO RESPIMAT) 2.5-2.5 MCG/ACT AERS Inhale 2 puffs into the lungs daily.   traZODone (DESYREL) 50 MG tablet TAKE 1/2 TO 1 TABLET(25 TO 50 MG) BY MOUTH AT BEDTIME AS NEEDED FOR SLEEP   vitamin B-12 (CYANOCOBALAMIN) 1000 MCG tablet Take 1,000 mcg by mouth daily.     Review of Systems      All other systems reviewed and are otherwise negative except as noted above.  Physical Exam    VS:  BP 100/60   Pulse 88   Ht '5\' 11"'$  (1.803 m)   Wt 188 lb 12.8 oz (85.6 kg)   SpO2 96%   BMI 26.33 kg/m  , BMI Body mass index is 26.33 kg/m.  Wt Readings from Last 3 Encounters:  09/07/21 188 lb 12.8 oz (85.6 kg)  09/03/21 191 lb (86.6 kg)  09/02/21 190 lb (86.2 kg)     GEN: Well nourished, well developed, in no acute distress. HEENT: normal. Cardiac: RRR, no murmurs, rubs, or gallops. No clubbing, cyanosis, edema.  Radials/PT 2+ and equal bilaterally.  Respiratory:  Respirations regular and unlabored, clear to auscultation bilaterally. GI: Soft, nontender, nondistended. MS: No deformity or atrophy. Skin: Warm and dry, no rash. Neuro:  Strength and sensation are intact. Psych: Normal affect.  Assessment & Plan    CAD without angina pectoris -Continue GDMT: lipitor, ranexa, xarelto, as needed nitro -No chest pain, recent Echo without wall motion abnormality  2. Syncope -Full workup while in the hospital including head CT, EEG, labs, and echocardiogram -Labs next month  -Consider carotid US-last [place] was 11/2018 and showed right carotid up to 60% blockage -Also would recommend zio monitor for workup to ensure no pauses  3. Hypotension -borderline hypotension SBP 100 -continue to hold silodosin  -not on any other medications to lower BP -continue to maintain hydration 64 oz a day water and one electrolyte drink -compression socks recommended for likely orthostatic hypotension -Take BP at  home 2x daily and record. Send via mychart  Typical atrial flutter s/p ablation in 2017 -continue xarelto  -No issue with bleeding -NSR today on exam  Hyperlipidemia, LDL < 70 -09/2020 LDL 66 -Continue lipitor '80mg'$  daily - repeat LDL next month with labs  Abnormal aortic valve on echocardiogram 06/2021 -no AV endocarditis on most recent echo -Surveillance per Dr. Agustin Cree  Disposition: Follow up  1 month  with Jenne Campus, MD or APP.  Signed, Elgie Collard, [name] 09/07/2021, 9:18 AM [place]

## 2021-09-07 ENCOUNTER — Encounter: Payer: Self-pay | Admitting: Physician Assistant

## 2021-09-07 ENCOUNTER — Ambulatory Visit: Payer: Medicare Other | Admitting: Physician Assistant

## 2021-09-07 VITALS — BP 100/60 | HR 88 | Ht 71.0 in | Wt 188.8 lb

## 2021-09-07 DIAGNOSIS — I2510 Atherosclerotic heart disease of native coronary artery without angina pectoris: Secondary | ICD-10-CM | POA: Diagnosis not present

## 2021-09-07 DIAGNOSIS — R0609 Other forms of dyspnea: Secondary | ICD-10-CM | POA: Diagnosis not present

## 2021-09-07 DIAGNOSIS — J84112 Idiopathic pulmonary fibrosis: Secondary | ICD-10-CM | POA: Diagnosis not present

## 2021-09-07 DIAGNOSIS — I10 Essential (primary) hypertension: Secondary | ICD-10-CM

## 2021-09-07 DIAGNOSIS — I483 Typical atrial flutter: Secondary | ICD-10-CM

## 2021-09-07 DIAGNOSIS — I480 Paroxysmal atrial fibrillation: Secondary | ICD-10-CM

## 2021-09-07 DIAGNOSIS — E785 Hyperlipidemia, unspecified: Secondary | ICD-10-CM

## 2021-09-07 DIAGNOSIS — I6521 Occlusion and stenosis of right carotid artery: Secondary | ICD-10-CM

## 2021-09-07 NOTE — Patient Instructions (Addendum)
Medication Instructions:  Your physician recommends that you continue on your current medications as directed. Please refer to the Current Medication list given to you today.  *If you need a refill on your cardiac medications before your next appointment, please call your pharmacy*   Lab Work: CMET, Mag, LFT's, Lipids, CBC, TSH when you see Dr Agustin Cree next month on 10/13/21 If you have labs (blood work) drawn today and your tests are completely normal, you will receive your results only by: [place] (if you have MyChart) OR A paper copy in the mail If you have any lab test that is abnormal or we need to change your treatment, we will call you to review the results.   Follow-Up: At [facility], you and your health needs are our priority.  As part of our continuing mission to provide you with exceptional heart care, we have created designated Provider Care Teams.  These Care Teams include your primary Cardiologist (physician) and Advanced Practice Providers (APPs -  Physician Assistants and Nurse Practitioners) who all work together to provide you with the care you need, when you need it.   Your next appointment:   10/13/21 at 10:00 AM  The format for your next appointment:   In Person  Provider:   Jenne Campus, MD{  Other Instructions 1.Check your blood pressure daily over the next 1-2 weeks and send [place] the readings through your MyChart 2.Try to drink 64 oz of water a day 3.You may have 1 electrolyte drink a day 4.[facility] [address] [place], [place], [place] [address] [phone] is where we discussed to purchase compression socks Important Information About Sugar

## 2021-09-07 NOTE — Addendum Note (Signed)
Addended by: Juventino Slovak on: 09/07/2021 05:09 PM   Modules accepted: Orders

## 2021-09-08 ENCOUNTER — Ambulatory Visit (INDEPENDENT_AMBULATORY_CARE_PROVIDER_SITE_OTHER): Payer: Medicare Other

## 2021-09-08 DIAGNOSIS — I480 Paroxysmal atrial fibrillation: Secondary | ICD-10-CM

## 2021-09-08 NOTE — Progress Notes (Unsigned)
Enrolled for [employer] to mail a ZIO XT long term holter monitor to the patients address on file.   Dr. Krasowski to read. 

## 2021-09-10 LAB — VITAMIN B1: Vitamin B1 (Thiamine): 13 nmol/L (ref 8–30)

## 2021-09-11 DIAGNOSIS — I480 Paroxysmal atrial fibrillation: Secondary | ICD-10-CM

## 2021-09-15 ENCOUNTER — Ambulatory Visit: Payer: Medicare Other | Admitting: Neurology

## 2021-09-15 ENCOUNTER — Encounter: Payer: Self-pay | Admitting: Neurology

## 2021-09-15 VITALS — BP 119/70 | HR 83 | Ht 71.0 in | Wt 190.0 lb

## 2021-09-15 DIAGNOSIS — G3184 Mild cognitive impairment, so stated: Secondary | ICD-10-CM | POA: Diagnosis not present

## 2021-09-15 DIAGNOSIS — R55 Syncope and collapse: Secondary | ICD-10-CM | POA: Diagnosis not present

## 2021-09-15 NOTE — Progress Notes (Signed)
[facility]  PATIENT: Robert Lang DOB: 04-24-1950  REQUESTING CLINICIAN: [name], [name], [name] HISTORY FROM: Patient and spouse  REASON FOR VISIT: Recurrent syncope    HISTORICAL  CHIEF COMPLAINT:  Chief Complaint  Patient presents with   New Patient (Initial Visit)    Rm 12. Accompanied by wife. ED referral for Syncope and altered mental status.    HISTORY OF PRESENT ILLNESS:  This is a 71 year old male with multiple medical conditions including hypertension, hyperlipidemia, atrial fibrillation, COPD, pulmonary fibrosis who is presenting after being admitted to the hospital after multiple syncopal episodes.  Patient reports in end of May he got tested positive for COVID, he was having symptoms and was started on Plaxovid which he completed on June 4.  During this time while he was battling COVID, he did have generalized weakness, he actually had a fall and had difficulty getting of the floor, and on June 6 while being out with his wife had a syncopal episode while in the car.  Prior to the syncope he did complain of dizziness and feeling weak. He presented to [facility], admitted from the 6 to the 7 for syncope, initial work-up including MRI Brain was negative for any etiology of the syncope. On June 9 he presented again to [facility] for 2 additional syncope, prior to the episode he reported feeling dizzy.  In the ED he had 1 syncopal episode which was witnessed and there is report of left leg shaking with the syncope. There was no associated urinary incontinence, no tongue biting and no postictal fusion Again patient was admitted, work-up was unrevealing, he did have overnight EEG which was negative.  Again he was diagnosed with convulsive syncope likely due to dehydration and related to recent COVID infection. Since being discharged from the hospital, he reports feeling better, doing pretty good, he feels weak, feel drained but again no additional  syncopal episode.  He is scheduled to see his pulmonologist on 5 July    OTHER MEDICAL CONDITIONS: Pulmonary fibrosis, depression/anxiety, hypertension, hyperlipidemia, atrial fibrillation, COPD, obstructive sleep apnea on CPAP   REVIEW OF SYSTEMS: Full 14 system review of systems performed and negative with exception of: as noted in the HPI   ALLERGIES: Allergies  Allergen Reactions   Naproxen Other (See Comments)    (Naprosyn *ANALGESICS - ANTI-INFLAMMATORY*) Nausea, Abdominal pain   Silodosin Other (See Comments)    Low blood pressure    HOME MEDICATIONS: Outpatient Medications Prior to Visit  Medication Sig Dispense Refill   acetaminophen (TYLENOL) 325 MG tablet Take 2 tablets (650 mg total) by mouth every 6 (six) hours as needed.     atorvastatin (LIPITOR) 80 MG tablet Take 1 tablet (80 mg total) by mouth at bedtime. 90 tablet 3   buPROPion (WELLBUTRIN XL) 300 MG 24 hr tablet Take 1 tablet (300 mg total) by mouth daily. 90 tablet 0   busPIRone (BUSPAR) 15 MG tablet Take 15 mg by mouth 2 (two) times daily.     Cholecalciferol 25 MCG (1000 UT) tablet Take 1,000 Units by mouth daily.      docusate sodium (COLACE) 100 MG capsule Take 100 mg by mouth daily.     famotidine (PEPCID) 40 MG tablet Take 40 mg by mouth at bedtime.     fexofenadine (ALLEGRA) 180 MG tablet Take 180 mg by mouth at bedtime.      finasteride (PROSCAR) 5 MG tablet Take 1 tablet (5 mg total) by mouth daily. 90 tablet 1  fluticasone (FLONASE) 50 MCG/ACT nasal spray Place 1 spray into both nostrils daily as needed for allergies or rhinitis.     gabapentin (NEURONTIN) 100 MG capsule TAKE 4 CAPSULES BY MOUTH AT BEDTIME 120 capsule 3   Ginger, Zingiber officinalis, (GINGER ROOT) 550 MG CAPS Take 550 mg by mouth daily.     melatonin 5 MG TABS Take 10 mg by mouth at bedtime.     Multiple Vitamins-Minerals (PRESERVISION AREDS PO) Take 1 capsule by mouth in the morning and at bedtime.     nitroGLYCERIN (NITROSTAT) 0.4  MG SL tablet Place 1 tablet (0.4 mg total) under the tongue every 5 (five) minutes as needed for chest pain. 25 tablet 3   NON FORMULARY CPAP at bedtime     ondansetron (ZOFRAN) 4 MG tablet TAKE 1 TABLET(4 MG) BY MOUTH EVERY 8 HOURS AS NEEDED FOR NAUSEA OR VOMITING 20 tablet 0   pantoprazole (PROTONIX) 40 MG tablet Take 1 tablet (40 mg total) by mouth daily. 90 tablet 3   Pirfenidone (ESBRIET) 801 MG TABS Take 801 mg by mouth 3 (three) times daily. 270 tablet 1   Polyethylene Glycol 3350 (MIRALAX PO) Take 17 g by mouth daily.     ranolazine (RANEXA) 1000 MG SR tablet Take 1 tablet (1,000 mg total) by mouth 2 (two) times daily. 180 tablet 2   rivaroxaban (XARELTO) 20 MG TABS tablet TAKE 1 TABLET(20 MG) BY MOUTH DAILY WITH SUPPER 30 tablet 5   sertraline (ZOLOFT) 100 MG tablet Take 100 mg by mouth at bedtime.     Tiotropium Bromide-Olodaterol (STIOLTO RESPIMAT) 2.5-2.5 MCG/ACT AERS Inhale 2 puffs into the lungs daily. 4 g 0   traZODone (DESYREL) 50 MG tablet TAKE 1/2 TO 1 TABLET(25 TO 50 MG) BY MOUTH AT BEDTIME AS NEEDED FOR SLEEP 30 tablet 3   vitamin B-12 (CYANOCOBALAMIN) 1000 MCG tablet Take 1,000 mcg by mouth daily.     No facility-administered medications prior to visit.    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 M75.00)   Allergic rhinitis due to pollen 09/03/2013   J30.1)  Formatting of this note might be different from the original. J30.1)   Allergy    Anticoagulated 07/01/2016   Anxiety    Arthritis    Atrial fibrillation (HCC)    Atrial flutter (HCC) 06/26/2015   CAD (coronary artery disease)    Chest pain 06/26/2015   COPD (chronic obstructive pulmonary disease) (HCC)    Coronary artery disease 07/06/2018   Cardiac catheterization 2017 showing 90%  small diagonal branch disease   DDD (degenerative disc disease), cervical 09/03/2013   M50.90)  Formatting of this note might be different from the original. M50.90)   Depression    Depression    Depressive disorder 09/03/2013   Dizziness 10/28/2017   Dyslipidemia, goal LDL below 70 07/06/2018   Dyspnea on exertion 10/20/2018   Esophageal reflux 09/03/2013   Essential hypertension 07/06/2018   ETOH abuse 01/11/2019   6 pack of beer per day   Falls 10/28/2017   GERD (gastroesophageal reflux disease)    H/O amiodarone therapy 07/01/2016   Heart attack (HCC)    Heart palpitations 01/27/2017   History of colon polyps    Hyperlipidemia    Hypertension    IPF (idiopathic pulmonary fibrosis) (HCC)  Kidney stones    Neck pain 09/03/2013   Neuropathy 07/06/2018   Obstructive sleep apnea 08/05/2015   PLMD (periodic limb movement disorder) 11/04/2015   Polycythemia, 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 STORY: Due to alcohol/ tobacco   Pure hypercholesterolemia 09/03/2013   E78.0)  Formatting of this note might be different from the original. E78.0)   Screening for prostate cancer 09/03/2013   Smoker 01/11/2019   Smoking 07/06/2018   Status post ablation of atrial flutter 07/06/2018   2017    PAST SURGICAL HISTORY: Past Surgical History:  Procedure Laterality Date   ATRIAL FIBRILLATION ABLATION  10/2015   BACK SURGERY     CARDIOVERSION  2017   CATARACT EXTRACTION Bilateral 2017   March and April 2017   COLONOSCOPY  2018   CORONARY STENT PLACEMENT  2012   CORONARY/GRAFT ACUTE MI REVASCULARIZATION N/A 01/10/2019   Procedure: CORONARY/GRAFT ACUTE MI REVASCULARIZATION;  Surgeon: Marykay Lex, MD;  Location: [facility] INVASIVE CV LAB;  Service: Cardiovascular;  Laterality: N/A;   INGUINAL HERNIA REPAIR     over 20 years ago   LAPAROSCOPIC APPENDECTOMY N/A 01/17/2021   Procedure: APPENDECTOMY  LAPAROSCOPIC;  Surgeon: Quentin Ore, MD;  Location: [facility] OR;  Service: General;  Laterality: N/A;   LEFT HEART CATH AND CORONARY ANGIOGRAPHY N/A 01/10/2019   Procedure: LEFT HEART CATH AND CORONARY ANGIOGRAPHY;  Surgeon: Marykay Lex, MD;  Location: [facility] INVASIVE CV LAB;  Service: Cardiovascular;  Laterality: N/A;   LUMBAR LAMINECTOMY Bilateral 04/05/2016   L2-L5    VENTRAL HERNIA REPAIR  2018    FAMILY HISTORY: Family History  Problem Relation Age of Onset   Colon cancer Father    Throat cancer Brother     SOCIAL HISTORY: Social History   Socioeconomic History   Marital status: Married    Spouse name: Not on file   Number of children: Not on file   Years of education: Not on file   Highest education level: Not on file  Occupational History   Not on file  Tobacco Use   Smoking status: Former    Packs/day: 1.00    Years: 30.00    Total pack years: 30.00    Types: Cigarettes    Quit date: 01/10/2019    Years since quitting: 2.6   Smokeless tobacco: Former  Building services engineer Use: Never used  Substance and Sexual Activity   Alcohol use: Yes    Comment: 8oz of wine a day   Drug use: Never   Sexual activity: Not on file  Other Topics Concern   Not on file  Social History Narrative   Not on file   Social Determinants of Health   Financial Resource Strain: Low Risk  (11/25/2020)   Overall Financial Resource Strain (CARDIA)    Difficulty of Paying Living Expenses: Not hard at all  Food Insecurity: No Food Insecurity (11/25/2020)   Hunger Vital Sign    Worried About Running Out of Food in the Last Year: Never true    Ran Out of Food in the Last Year: Never true  Transportation Needs: No Transportation Needs (11/25/2020)   PRAPARE - Administrator, Civil Service (Medical): No    Lack of Transportation (Non-Medical): No  Physical Activity: Inactive (11/25/2020)   Exercise Vital Sign    Days of Exercise per Week: 0 days    Minutes of Exercise per  Session: 0 min  Stress: No Stress Concern Present (11/25/2020)   [employer] of Occupational Health - Occupational Stress Questionnaire    Feeling of Stress : Not at all  Social Connections: Moderately Integrated (11/25/2020)   Social Connection and Isolation Panel [NHANES]    Frequency of Communication with Friends and Family: More than three times a week    Frequency of Social Gatherings with Friends and Family: More than three times a week    Attends Religious Services: More than 4 times per year    Active Member of [employer] or Organizations: No    Attends Banker Meetings: Never    Marital Status: Married  Catering manager Violence: Not At Risk (11/25/2020)   Humiliation, Afraid, Rape, and Kick questionnaire    Fear of Current or Ex-Partner: No    Emotionally Abused: No    Physically Abused: No    Sexually Abused: No    PHYSICAL EXAM  GENERAL EXAM/CONSTITUTIONAL: Vitals:  Vitals:   09/15/21 1151  BP: 119/70  Pulse: 83  Weight: 190 lb (86.2 kg)  Height: 5\' 11"  (1.803 m)   Body mass index is 26.5 kg/m. Wt Readings from Last 3 Encounters:  09/15/21 190 lb (86.2 kg)  09/07/21 188 lb 12.8 oz (85.6 kg)  09/03/21 191 lb (86.6 kg)   Patient is in no distress; well developed, nourished and groomed; neck is supple, has to stop mid sentence to take a breath  EYES: Pupils round and reactive to light, Visual fields full to confrontation, Extraocular movements intacts,   MUSCULOSKELETAL: Gait, strength, tone, movements noted in Neurologic exam below  NEUROLOGIC: MENTAL STATUS:      No data to display         awake, alert, oriented to person, place and time recent and remote memory intact normal attention and concentration language fluent, comprehension intact, naming intact fund of knowledge appropriate  CRANIAL NERVE:  2nd, 3rd, 4th, 6th - pupils equal and reactive to light, visual fields full to confrontation, extraocular muscles intact, no  nystagmus 5th - facial sensation symmetric 7th - facial strength symmetric 8th - hearing intact 9th - palate elevates symmetrically, uvula midline 11th - shoulder shrug symmetric 12th - tongue protrusion midline  MOTOR:  normal bulk and tone, full strength in the BUE, BLE  SENSORY:  normal and symmetric to light touch  COORDINATION:  finger-nose-finger, fine finger movements normal     DIAGNOSTIC DATA (LABS, IMAGING, TESTING) - I reviewed patient records, labs, notes, testing and imaging myself where available.  Lab Results  Component Value Date   WBC 7.4 09/02/2021   HGB 14.9 09/02/2021   HCT 42.4 09/02/2021   MCV 96.8 09/02/2021   PLT 278 09/02/2021      Component Value Date/Time   NA 136 09/02/2021 1541   NA 137 03/05/2020 1343   K 4.7 09/02/2021 1541   CL 101 09/02/2021 1541   CO2 27 09/02/2021 1541   GLUCOSE 84 09/02/2021 1541   BUN 14 09/02/2021 1541   BUN 14 03/05/2020 1343   CREATININE 0.92 09/02/2021 1541   CALCIUM 9.7 09/02/2021 1541   PROT 7.1 09/02/2021 1541   PROT 6.9 04/20/2019 0807   ALBUMIN 3.5 08/26/2021 1826   ALBUMIN 4.3 04/20/2019 0807   AST 14 09/02/2021 1541   ALT 20 09/02/2021 1541   ALKPHOS 70 08/26/2021 1826   BILITOT 0.4 09/02/2021 1541   BILITOT 0.5 04/20/2019 0807   GFRNONAA >60 08/28/2021 0443   GFRAA 81 03/05/2020 1343  Lab Results  Component Value Date   CHOL 138 10/13/2020   HDL 28.60 (L) 10/13/2020   LDLCALC 58 04/03/2020   LDLDIRECT 66.0 10/13/2020   TRIG 380.0 (H) 10/13/2020   CHOLHDL 5 10/13/2020   Lab Results  Component Value Date   HGBA1C 5.1 01/25/2020   Lab Results  Component Value Date   VITAMINB12 832 09/02/2021   Lab Results  Component Value Date   TSH 1.232 01/16/2021    Head CT 08/26/21:  1. No CT evidence for acute intracranial abnormality. 2. Atrophy and chronic small vessel ischemic changes of the white matter  MRI Brain 08/25/21 1. No acute intracranial abnormality.  2. Findings of chronic  small vessel ischemia and generalized cerebral volume loss   LTM EEG  This study is within normal limits. 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 year old male with multiple medical conditions including hypertension, hyperlipidemia, atrial fibrillation, COPD, pulmonary fibrosis who is presenting after being admitted to the hospital after multiple syncopal episodes.  Prodrome of dizziness, feeling lightheaded and drained prior to the syncope.  And also patient was recently diagnosed with COVID infection and has been battling COVID symptoms the week prior.  There were no report of tongue biting, urinary incontinence and no postictal confusion.  Patient likely has vasovagal syncope.  He did follow-up with your primary care doctor who advised him to increase his water intake and since then he has been doing okay.  At this time no further neurological testing indicated, advised patient to continue following up with primary care doctor and pulmonologist and to return as needed.     1. Syncope, unspecified syncope type   2. Convulsive syncope   3. Mild cognitive impairment     Patient Instructions  Continue current medications Continue to follow-up with your primary care doctor and your pulmonologist Follow-up as needed  No orders of the defined types were placed in this encounter.   No orders of the defined types were placed in this encounter.   Return if symptoms worsen or fail to improve.  I have spent a total of 62 minutes dedicated to this patient today, preparing to see patient, performing a medically appropriate examination and evaluation, ordering tests and/or medications and procedures, and counseling and educating the patient/family/caregiver; independently interpreting result and communicating results  to the family/patient/caregiver; and documenting clinical information in the electronic medical record.   Windell Norfolk, MD 09/15/2021, 2:09 PM  [facility] [address], [address] [place], [place] [address] [phone]

## 2021-09-16 ENCOUNTER — Ambulatory Visit (INDEPENDENT_AMBULATORY_CARE_PROVIDER_SITE_OTHER): Payer: Medicare Other | Admitting: Medical

## 2021-09-16 VITALS — BP 112/72 | HR 92 | Resp 18 | Ht 71.0 in | Wt 195.0 lb

## 2021-09-16 DIAGNOSIS — R350 Frequency of micturition: Secondary | ICD-10-CM

## 2021-09-16 DIAGNOSIS — R55 Syncope and collapse: Secondary | ICD-10-CM

## 2021-09-16 DIAGNOSIS — E871 Hypo-osmolality and hyponatremia: Secondary | ICD-10-CM | POA: Diagnosis not present

## 2021-09-16 DIAGNOSIS — R0609 Other forms of dyspnea: Secondary | ICD-10-CM | POA: Diagnosis not present

## 2021-09-16 DIAGNOSIS — J84112 Idiopathic pulmonary fibrosis: Secondary | ICD-10-CM | POA: Diagnosis not present

## 2021-09-16 DIAGNOSIS — N401 Enlarged prostate with lower urinary tract symptoms: Secondary | ICD-10-CM

## 2021-09-16 NOTE — Progress Notes (Signed)
Subjective:    Patient ID: Robert Lang, male    DOB: Aug 15, 1950, 71 y.o.   MRN: [id number]  HPI  Pt has seen cardiologist and neurologist recently post hospitalization.  Cardologist A/P  71 y.o. year old male with multiple medical conditions including hypertension, hyperlipidemia, atrial fibrillation, COPD, pulmonary fibrosis who is presenting after being admitted to the hospital after multiple syncopal episodes.  Prodrome of dizziness, feeling lightheaded and drained prior to the syncope.  And also patient was recently diagnosed with COVID infection and has been battling COVID symptoms the week prior.  There were no report of tongue biting, urinary incontinence and no postictal confusion.  Patient likely has vasovagal syncope.  He did follow-up with your primary care doctor who advised him to increase his water intake and since then he has been doing okay.  At this time no further neurological testing indicated, advised patient to continue following up with primary care doctor and pulmonologist and to return as needed. Pt has ziopatch on and will get echo with US carotids  Neurologist note A/P  71 y.o. year old male with multiple medical conditions including hypertension, hyperlipidemia, atrial fibrillation, COPD, pulmonary fibrosis who is presenting after being admitted to the hospital after multiple syncopal episodes.  Prodrome of dizziness, feeling lightheaded and drained prior to the syncope.  And also patient was recently diagnosed with COVID infection and has been battling COVID symptoms the week prior.  There were no report of tongue biting, urinary incontinence and no postictal confusion.  Patient likely has vasovagal syncope.  He did follow-up with your primary care doctor who advised him to increase his water intake and since then he has been doing okay.  At this time no further neurological testing indicated, advised patient to continue following up with primary care doctor and  pulmonologist and to return as needed.       1. Syncope, unspecified syncope type   2. Convulsive syncope   3. Mild cognitive impairment      Pt did contact urologist office and got advise on stopping siladosin. They stopped for one week and bp did increase to 112/72 today. This morning 133/67.    I though last visit.  "Possible vasovagal syncope one week ago around 08-25-2021( post covid infection onset) . Some dizziness that preceded. Other contributing causes low sodium, dehydration and low bp. Considering recent use of Rapaflo(silodosin) maybe playing role. Want you to dc this and flomax. Continue finasteride."  See pulmonologist next week. Idiopathic pulmonary fibrosis and dyspnea.    Review of Systems  Constitutional:  Negative for chills, fatigue and fever.  HENT:  Negative for congestion, drooling and ear pain.   Respiratory:  Positive for shortness of breath. Negative for cough, choking and wheezing.        Still having daily dyspnea.  Cardiovascular:  Negative for chest pain and palpitations.  Gastrointestinal:  Negative for abdominal pain, anal bleeding and blood in stool.  Genitourinary:  Negative for dysuria, enuresis and frequency.  Musculoskeletal:  Negative for back pain and myalgias.  Skin:  Negative for rash.  Neurological:  Negative for dizziness, tremors, speech difficulty, numbness and headaches.  Hematological:  Negative for adenopathy. Does not bruise/bleed easily.  Psychiatric/Behavioral:  Negative for behavioral problems and confusion. The patient is not nervous/anxious.     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 M75.00)   Allergic rhinitis due to pollen 09/03/2013   J30.1)  Formatting of this note might be different from the original. J30.1)    Allergy    Anticoagulated 07/01/2016   Anxiety    Arthritis    Atrial fibrillation ([place])    Atrial flutter ([place]) 06/26/2015   CAD (coronary artery disease)    Chest pain 06/26/2015   COPD (chronic obstructive pulmonary disease) ([place])    Coronary artery disease 07/06/2018   Cardiac catheterization 2017 showing 90% small diagonal branch disease   DDD (degenerative disc disease), cervical 09/03/2013   M50.90)  Formatting of this note might be different from the original. M50.90)   Depression    Depression    Depressive disorder 09/03/2013   Dizziness 10/28/2017   Dyslipidemia, goal LDL below 70 07/06/2018   Dyspnea on exertion 10/20/2018   Esophageal reflux 09/03/2013   Essential hypertension 07/06/2018   ETOH abuse 01/11/2019   6 pack of beer per day   Falls 10/28/2017   GERD (gastroesophageal reflux disease)    H/O amiodarone therapy 07/01/2016   Heart attack ([place])    Heart palpitations 01/27/2017   History of colon polyps    Hyperlipidemia    Hypertension    IPF (idiopathic pulmonary fibrosis) ([place])    Kidney stones    Neck pain 09/03/2013   Neuropathy 07/06/2018   Obstructive sleep apnea 08/05/2015   PLMD (periodic limb movement disorder) 11/04/2015   Polycythemia, 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 STORY: Due to alcohol/ tobacco   Pure hypercholesterolemia 09/03/2013   E78.0)  Formatting of this note might be different from the original. E78.0)   Screening for prostate cancer 09/03/2013   Smoker 01/11/2019   Smoking 07/06/2018   Status post ablation of atrial flutter 07/06/2018   2017     Social History   Socioeconomic History   Marital status: Married    Spouse name: Not on file   Number of children: Not on file   Years of education: Not on file   Highest education level: Not on file  Occupational History   Not on file  Tobacco Use   Smoking status: Former    Packs/day:  1.00    Years: 30.00    Total pack years: 30.00    Types: Cigarettes    Quit date: 01/10/2019    Years since quitting: 2.6   Smokeless tobacco: Former  Scientific laboratory technician Use: Never used  Substance and Sexual Activity   Alcohol use: Yes    Comment: 8oz of wine a day   Drug use: Never   Sexual activity: Not on file  Other Topics Concern   Not on file  Social History Narrative   Not on file   Social Determinants of Health   Financial Resource Strain: Low Risk  (11/25/2020)   Overall Financial Resource Strain (CARDIA)    Difficulty of Paying Living Expenses: Not hard at all  Food Insecurity: No Food Insecurity (11/25/2020)   Hunger Vital Sign    Worried About Running Out of Food in the Last Year: Never true    Ran Out of Food in the Last Year: Never true  Transportation Needs: No Transportation Needs (11/25/2020)   PRAPARE - Hydrologist (Medical): No    Lack of Transportation (Non-Medical): No  Physical Activity: Inactive (11/25/2020)  Exercise Vital Sign    Days of Exercise per Week: 0 days    Minutes of Exercise per Session: 0 min  Stress: No Stress Concern Present (11/25/2020)   [place]    Feeling of Stress : Not at all  Social Connections: Moderately Integrated (11/25/2020)   Social Connection and Isolation Panel [NHANES]    Frequency of Communication with Friends and Family: More than three times a week    Frequency of Social Gatherings with Friends and Family: More than three times a week    Attends Religious Services: More than 4 times per year    Active Member of [employer] or Organizations: No    Attends Archivist Meetings: Never    Marital Status: Married  Human resources officer Violence: Not At Risk (11/25/2020)   Humiliation, Afraid, Rape, and Kick questionnaire    Fear of Current or Ex-Partner: No    Emotionally Abused: No    Physically Abused: No    Sexually Abused: No     Past Surgical History:  Procedure Laterality Date   ATRIAL FIBRILLATION ABLATION  10/2015   BACK SURGERY     CARDIOVERSION  2017   CATARACT EXTRACTION Bilateral 2017   March and April 2017   COLONOSCOPY  2018   CORONARY STENT PLACEMENT  2012   CORONARY/GRAFT ACUTE MI REVASCULARIZATION N/A 01/10/2019   Procedure: CORONARY/GRAFT ACUTE MI REVASCULARIZATION;  Surgeon: Leonie Man, MD;  Location: [facility];  Service: Cardiovascular;  Laterality: N/A;   INGUINAL HERNIA REPAIR     over 20 years ago   LAPAROSCOPIC APPENDECTOMY N/A 01/17/2021   Procedure: APPENDECTOMY LAPAROSCOPIC;  Surgeon: Felicie Morn, MD;  Location: [facility];  Service: General;  Laterality: N/A;   LEFT HEART CATH AND CORONARY ANGIOGRAPHY N/A 01/10/2019   Procedure: LEFT HEART CATH AND CORONARY ANGIOGRAPHY;  Surgeon: Leonie Man, MD;  Location: [facility];  Service: Cardiovascular;  Laterality: N/A;   LUMBAR LAMINECTOMY Bilateral 04/05/2016   L2-L5    VENTRAL HERNIA REPAIR  2018    Family History  Problem Relation Age of Onset   Colon cancer Father    Throat cancer Brother     Allergies  Allergen Reactions   Naproxen Other (See Comments)    (Naprosyn *ANALGESICS - ANTI-INFLAMMATORY*) Nausea, Abdominal pain   Silodosin Other (See Comments)    Low blood pressure    Current Outpatient Medications on File Prior to Visit  Medication Sig Dispense Refill   acetaminophen (TYLENOL) 325 MG tablet Take 2 tablets (650 mg total) by mouth every 6 (six) hours as needed.     atorvastatin (LIPITOR) 80 MG tablet Take 1 tablet (80 mg total) by mouth at bedtime. 90 tablet 3   buPROPion (WELLBUTRIN XL) 300 MG 24 hr tablet Take 1 tablet (300 mg total) by mouth daily. 90 tablet 0   busPIRone (BUSPAR) 15 MG tablet Take 15 mg by mouth 2 (two) times daily.     Cholecalciferol 25 MCG (1000 UT) tablet Take 1,000 Units by mouth daily.      docusate sodium (COLACE) 100 MG capsule Take 100 mg by mouth  daily.     famotidine (PEPCID) 40 MG tablet Take 40 mg by mouth at bedtime.     fexofenadine (ALLEGRA) 180 MG tablet Take 180 mg by mouth at bedtime.      finasteride (PROSCAR) 5 MG tablet Take 1 tablet (5 mg total) by mouth daily. 90 tablet  1   fluticasone (FLONASE) 50 MCG/ACT nasal spray Place 1 spray into both nostrils daily as needed for allergies or rhinitis.     gabapentin (NEURONTIN) 100 MG capsule TAKE 4 CAPSULES BY MOUTH AT BEDTIME 120 capsule 3   Ginger, Zingiber officinalis, (GINGER ROOT) 550 MG CAPS Take 550 mg by mouth daily.     melatonin 5 MG TABS Take 10 mg by mouth at bedtime.     Multiple Vitamins-Minerals (PRESERVISION AREDS PO) Take 1 capsule by mouth in the morning and at bedtime.     nitroGLYCERIN (NITROSTAT) 0.4 MG SL tablet Place 1 tablet (0.4 mg total) under the tongue every 5 (five) minutes as needed for chest pain. 25 tablet 3   NON FORMULARY CPAP at bedtime     ondansetron (ZOFRAN) 4 MG tablet TAKE 1 TABLET(4 MG) BY MOUTH EVERY 8 HOURS AS NEEDED FOR NAUSEA OR VOMITING 20 tablet 0   pantoprazole (PROTONIX) 40 MG tablet Take 1 tablet (40 mg total) by mouth daily. 90 tablet 3   Pirfenidone (ESBRIET) 801 MG TABS Take 801 mg by mouth 3 (three) times daily. 270 tablet 1   Polyethylene Glycol 3350 (MIRALAX PO) Take 17 g by mouth daily.     ranolazine (RANEXA) 1000 MG SR tablet Take 1 tablet (1,000 mg total) by mouth 2 (two) times daily. 180 tablet 2   rivaroxaban (XARELTO) 20 MG TABS tablet TAKE 1 TABLET(20 MG) BY MOUTH DAILY WITH SUPPER 30 tablet 5   sertraline (ZOLOFT) 100 MG tablet Take 100 mg by mouth at bedtime.     Tiotropium Bromide-Olodaterol (STIOLTO RESPIMAT) 2.5-2.5 MCG/ACT AERS Inhale 2 puffs into the lungs daily. 4 g 0   traZODone (DESYREL) 50 MG tablet TAKE 1/2 TO 1 TABLET(25 TO 50 MG) BY MOUTH AT BEDTIME AS NEEDED FOR SLEEP 30 tablet 3   vitamin B-12 (CYANOCOBALAMIN) 1000 MCG tablet Take 1,000 mcg by mouth daily.     No current facility-administered  medications on file prior to visit.    BP 112/72   Pulse 92   Resp 18   Ht '5\' 11"'$  (1.803 m)   Wt 195 lb (88.5 kg)   SpO2 93%   BMI 27.20 kg/m        Objective:   Physical Exam  General: Color- Normal Color. Moisture- Normal Moisture.   Neck Carotid Arteries- Normal color. Moisture- Normal Moisture. No carotid bruits. No JVD.   Chest and Lung Exam Auscultation: Breath Sounds:-Normal.   Cardiovascular Auscultation:Rythm- Regular. Murmurs & Other Heart Sounds:Auscultation of the heart reveals- No Murmurs.   Abdomen Inspection:-Inspeection Normal. Palpation/Percussion:Note:No mass. Palpation and Percussion of the abdomen reveal- Non Tender, Non Distended + BS, no rebound or guarding.      Neurologic Cranial Nerve exam:- CN III-XII intact(No nystagmus), symmetric smile. Drift Test:- No drift. Romberg Exam:- Negative.  Heal to Toe Gait exam:-Normal. Finger to Nose:- Normal/Intact Strength:- 5/5 equal and symmetric strength both upper and lower extremities.    Lower ext- no pedal edema.      Assessment & Plan:   Patient Instructions  Glad to hear no further syncope or dizzy episodes. Still be cautious sitting to standing.  Recent lower end bp levels may have been contributor to above. Stay off rapaflo.  For bph continue finasteride.  Follow thru with cardiologist work up/zio patch.  IPF with dyspnea daily. Continue with current inhaler stioto and follow up with pulmonologist.  Low na levels are now under control/normal.  Recovered from covid. Residual fatigue and dyspnea  related to.  Follow up in 3-4 months or sooner if needed.   Mackie Pai, [name]

## 2021-09-16 NOTE — Patient Instructions (Addendum)
Glad to hear no further syncope or dizzy episodes. Still be cautious sitting to standing.  Recent lower end bp levels may have been contributor to above. Stay off rapaflo.   For bph continue finasteride.  Follow thru with cardiologist work up/zio patch.  IPF with dyspnea daily. Continue with current inhaler stioto and follow up with pulmonologist.  Low na levels are now under control/normal.  Recovered from covid. Residual fatigue and dyspnea related to.  Follow up in 3-4 months or sooner if needed.

## 2021-09-24 ENCOUNTER — Ambulatory Visit: Payer: Medicare Other | Admitting: Internal Medicine

## 2021-09-24 ENCOUNTER — Encounter: Payer: Self-pay | Admitting: Internal Medicine

## 2021-09-24 VITALS — BP 124/70 | HR 103 | Temp 97.8°F | Ht 71.0 in | Wt 188.2 lb

## 2021-09-24 DIAGNOSIS — Z5181 Encounter for therapeutic drug level monitoring: Secondary | ICD-10-CM | POA: Diagnosis not present

## 2021-09-24 DIAGNOSIS — J84112 Idiopathic pulmonary fibrosis: Secondary | ICD-10-CM | POA: Diagnosis not present

## 2021-09-24 MED ORDER — STIOLTO RESPIMAT 2.5-2.5 MCG/ACT IN AERS: 2.0000 | INHALATION_SPRAY | Freq: Every day | RESPIRATORY_TRACT | 5 refills | Status: DC

## 2021-09-24 NOTE — Progress Notes (Signed)
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 lung disease is unchanged from 05/08/2020 and may be due to nonspecific interstitial pneumonitis or usual interstitial pneumonitis. Findings are categorized as probable UIP per consensus guidelines: Diagnosis of Idiopathic Pulmonary Fibrosis: An Official ATS/ERS/JRS/ALAT Clinical Practice Guideline. [place], Iss 5, [phone], Nov 20 2016. 2. Aortic atherosclerosis (ICD10-I70.0). Coronary artery calcification. 3.  Emphysema (ICD10-J43.9).     Electronically Signed   By: Lorin Picket M.D.   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), Allergic rhinitis due to pollen (09/03/2013), Allergy, Anticoagulated (07/01/2016), Anxiety, Arthritis, Atrial fibrillation ([place]), Atrial flutter ([place]) (06/26/2015), CAD (coronary artery disease), Chest pain (06/26/2015), COPD (chronic obstructive pulmonary disease) ([place]), Coronary artery disease (07/06/2018), DDD (degenerative disc disease), cervical (09/03/2013), Depression, Depression, Depressive disorder (09/03/2013), Dizziness (10/28/2017), Dyslipidemia, goal LDL below 70 (07/06/2018), Dyspnea on exertion (10/20/2018), Esophageal reflux (09/03/2013), Essential hypertension (07/06/2018), ETOH abuse (01/11/2019), Falls (10/28/2017), GERD (gastroesophageal reflux disease), H/O amiodarone therapy (07/01/2016), Heart attack ([place]), Heart palpitations (01/27/2017), History of colon polyps, Hyperlipidemia, Hypertension, IPF (idiopathic pulmonary fibrosis) ([place]), Kidney stones, Neck pain (09/03/2013), Neuropathy (07/06/2018), Obstructive sleep apnea (08/05/2015), PLMD (periodic limb  movement disorder) (11/04/2015), Polycythemia, 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   reports that he quit smoking about 2 years ago. His smoking use included cigarettes. He has a 30.00 pack-year smoking history. He has quit using smokeless tobacco.  Past Surgical History:  Procedure Laterality Date   ATRIAL FIBRILLATION ABLATION  10/2015   BACK SURGERY     CARDIOVERSION  2017   CATARACT EXTRACTION Bilateral 2017   March and April 2017   COLONOSCOPY  2018   CORONARY STENT PLACEMENT  2012   CORONARY/GRAFT ACUTE MI REVASCULARIZATION N/A 01/10/2019   Procedure: CORONARY/GRAFT ACUTE MI REVASCULARIZATION;  Surgeon: Leonie Man, MD;  Location: [facility];  Service: Cardiovascular;  Laterality: N/A;   INGUINAL HERNIA REPAIR     over 20  years ago   LAPAROSCOPIC APPENDECTOMY N/A 01/17/2021   Procedure: APPENDECTOMY LAPAROSCOPIC;  Surgeon: Felicie Morn, MD;  Location: [facility];  Service: General;  Laterality: N/A;   LEFT HEART CATH AND CORONARY ANGIOGRAPHY N/A 01/10/2019   Procedure: LEFT HEART CATH AND CORONARY ANGIOGRAPHY;  Surgeon: Leonie Man, MD;  Location: [facility];  Service: Cardiovascular;  Laterality: N/A;   LUMBAR LAMINECTOMY Bilateral 04/05/2016   L2-L5    VENTRAL HERNIA REPAIR  2018    Allergies  Allergen Reactions   Naproxen Other (See Comments)    (Naprosyn *ANALGESICS - ANTI-INFLAMMATORY*) Nausea, Abdominal pain   Silodosin Other (See Comments)    Low blood pressure    Immunization History  Administered Date(s) Administered   Influenza Split 01/09/2010, 02/09/2011   Influenza, High Dose Seasonal PF 11/15/2018, 01/11/2020, 12/27/2020   Influenza,inj,Quad PF,6+ Mos 01/01/2015   Influenza-Unspecified 01/30/2014, 02/02/2016, 12/15/2020   PFIZER(Purple Top)SARS-COV-2 Vaccination 04/27/2019, 05/18/2019, 01/14/2020, 07/22/2020, 12/15/2020   Pneumococcal Conjugate-13 11/23/2018   Pneumococcal Polysaccharide-23 08/12/2020   Tdap 02/18/2011    Family History  Problem Relation Age of Onset   Colon cancer Father    Throat cancer Brother      Current Outpatient Medications:    acetaminophen (TYLENOL) 325 MG tablet, Take 2 tablets (650 mg total) by mouth every 6 (six) hours as needed., Disp: , Rfl:    atorvastatin (LIPITOR) 80 MG tablet, Take 1 tablet (80 mg total) by mouth at bedtime., Disp: 90 tablet, Rfl: 3   buPROPion (WELLBUTRIN XL) 300 MG 24 hr tablet, Take 1 tablet (300 mg total) by mouth daily., Disp: 90 tablet, Rfl: 0   busPIRone (BUSPAR) 15 MG tablet, Take 15 mg by mouth 2 (two) times daily., Disp: , Rfl:    Cholecalciferol 25 MCG (1000 UT) tablet, Take 1,000 Units by mouth daily. , Disp: , Rfl:    docusate sodium (COLACE) 100 MG capsule, Take 100 mg by mouth daily., Disp: ,  Rfl:    famotidine (PEPCID) 40 MG tablet, Take 40 mg by mouth at bedtime., Disp: , Rfl:    fexofenadine (ALLEGRA) 180 MG tablet, Take 180 mg by mouth at bedtime. , Disp: , Rfl:    finasteride (PROSCAR) 5 MG tablet, Take 1 tablet (5 mg total) by mouth daily., Disp: 90 tablet, Rfl: 1   fluticasone (FLONASE) 50 MCG/ACT nasal spray, Place 1 spray into both nostrils daily as needed for allergies or rhinitis., Disp: , Rfl:    gabapentin (NEURONTIN) 100 MG capsule, TAKE 4 CAPSULES BY MOUTH AT BEDTIME, Disp: 120 capsule, Rfl: 3   Ginger, Zingiber officinalis, (GINGER ROOT) 550 MG  CAPS, Take 550 mg by mouth daily., Disp: , Rfl:    melatonin 5 MG TABS, Take 10 mg by mouth at bedtime., Disp: , Rfl:    Multiple Vitamins-Minerals (PRESERVISION AREDS PO), Take 1 capsule by mouth in the morning and at bedtime., Disp: , Rfl:    nitroGLYCERIN (NITROSTAT) 0.4 MG SL tablet, Place 1 tablet (0.4 mg total) under the tongue every 5 (five) minutes as needed for chest pain., Disp: 25 tablet, Rfl: 3   NON FORMULARY, CPAP at bedtime, Disp: , Rfl:    ondansetron (ZOFRAN) 4 MG tablet, TAKE 1 TABLET(4 MG) BY MOUTH EVERY 8 HOURS AS NEEDED FOR NAUSEA OR VOMITING, Disp: 20 tablet, Rfl: 0   pantoprazole (PROTONIX) 40 MG tablet, Take 1 tablet (40 mg total) by mouth daily., Disp: 90 tablet, Rfl: 3   Pirfenidone (ESBRIET) 801 MG TABS, Take 801 mg by mouth 3 (three) times daily., Disp: 270 tablet, Rfl: 1   Polyethylene Glycol 3350 (MIRALAX PO), Take 17 g by mouth daily., Disp: , Rfl:    ranolazine (RANEXA) 1000 MG SR tablet, Take 1 tablet (1,000 mg total) by mouth 2 (two) times daily., Disp: 180 tablet, Rfl: 2   rivaroxaban (XARELTO) 20 MG TABS tablet, TAKE 1 TABLET(20 MG) BY MOUTH DAILY WITH SUPPER, Disp: 30 tablet, Rfl: 5   sertraline (ZOLOFT) 100 MG tablet, Take 100 mg by mouth at bedtime., Disp: , Rfl:    traZODone (DESYREL) 50 MG tablet, TAKE 1/2 TO 1 TABLET(25 TO 50 MG) BY MOUTH AT BEDTIME AS NEEDED FOR SLEEP, Disp: 30 tablet,  Rfl: 3   vitamin B-12 (CYANOCOBALAMIN) 1000 MCG tablet, Take 1,000 mcg by mouth daily., Disp: , Rfl:    Tiotropium Bromide-Olodaterol (STIOLTO RESPIMAT) 2.5-2.5 MCG/ACT AERS, Inhale 2 puffs into the lungs daily., Disp: 4 g, Rfl: 5      Objective:   Vitals:   09/24/21 1127  BP: 124/70  Pulse: (!) 103  Temp: 97.8 F (36.6 C)  TempSrc: Oral  SpO2: 97%  Weight: 188 lb 3.2 oz (85.4 kg)  Height: '5\' 11"'$  (1.803 m)    Estimated body mass index is 26.25 kg/m as calculated from the following:   Height as of this encounter: '5\' 11"'$  (1.803 m).   Weight as of this encounter: 188 lb 3.2 oz (85.4 kg).  '@WEIGHTCHANGE'$ @  [employer]   09/24/21 1127  Weight: 188 lb 3.2 oz (85.4 kg)   General: No distress. Loos well. Has cane Neuro: Alert and Oriented x 3. GCS 15. Speech normal Psych: Pleasant Resp:  Barrel Chest - no.  Wheeze - no, Crackles - VELCRO AT BASE, No overt respiratory distress CVS: Normal heart sounds. Murmurs - no Ext: Stigmata of Connective Tissue Disease - NO. HAS CANE HEENT: Normal upper airway. PEERL +. No post nasal drip        Assessment:       ICD-10-CM   1. IPF (idiopathic pulmonary fibrosis) ([place])  K74.259 Pulmonary function test    Hepatic function panel    2. Medication monitoring encounter  Z51.81 Hepatic function panel         Plan:     Patient Instructions   IPF (idiopathic pulmonary fibrosis) ([place]) Medication monitoring encounter DOE (dyspnea on exertion) History COVID May 2023  CT April 2023 and curent symptoms suggest stabilty with esbriet LFT normal mid June 2023 Glad you are improved from covid  plan - check LFT in 3 months - continue esbriet per schedule - do spiro/dlco n 3 months -  will discuss research studies in future  Syncope/Passing out  - undergoing cards workup. On ZIo monitor now -   Plan  - per cardilogy who is also seeing you for potential heart valave vegetation seen in echo spring 2023  Travel Advice  encounter  Plan  - ok to drive 0-0F by car next week to see your brpther  Small Lung nodules -feb 2022 -0 describd as same size 35m in April 2023  - these are likely lymph nodes  Plan  -no need to follow thse nodules  RUQ abdominal pain  Unclear cause. GI worked you up. Currently resolved. Unlikely related to esbreit  Plan  -per GI  Follow-up - 3 months Dr RChase Callerbut after PFT; symptom score and walk at followup  High complex medical condition requiring intensive therapeutic monitoring  ( Level 05 visit: Estb 40-54 min  in  visit type: on-site physical face to visit  in total care time and counseling or/and coordination of care by this undersigned MD - Dr MBrand Males This includes one or more of the following on this same day 09/24/2021: pre-charting, chart review, note writing, documentation discussion of test results, diagnostic or treatment recommendations, prognosis, risks and benefits of management options, instructions, education, compliance or risk-factor reduction. It excludes time spent by the [place] office staff in the care of the patient. Actual time 642min)   SIGNATURE    Dr. MBrand Males M.D., F.C.C.P,  Pulmonary and Critical Care Medicine Staff Physician, CCareyDirector - Interstitial Lung Disease  Program  Pulmonary [place] [id number] Pager: [phone] If no answer or between  15:00h - 7:00h: call [phone] Telephone: [phone]  4:47 PM 09/24/2021

## 2021-09-24 NOTE — Patient Instructions (Addendum)
  IPF (idiopathic pulmonary fibrosis) ([place]) Medication monitoring encounter DOE (dyspnea on exertion) History COVID May 2023  CT April 2023 and curent symptoms suggest stabilty with esbriet LFT normal mid June 2023 Glad you are improved from covid  plan - check LFT in 3 months - continue esbriet per schedule - do spiro/dlco n 3 months - will discuss research studies in future  Syncope/Passing out  - undergoing cards workup. On ZIo monitor now -   Plan  - per cardilogy who is also seeing you for potential heart valave vegetation seen in echo spring 2023  Travel Advice encounter  Plan  - ok to drive 6-4R by car next week to see your brpther  Small Lung nodules -feb 2022 -0 describd as same size 82m in April 2023  - these are likely lymph nodes  Plan  -no need to follow thse nodules  RUQ abdominal pain  Unclear cause. GI worked you up. Currently resolved. Unlikely related to esbreit  Plan  -per GI  Follow-up - 3 months Dr RChase Callerbut after PFT; symptom score and walk at followup

## 2021-09-28 ENCOUNTER — Ambulatory Visit (HOSPITAL_BASED_OUTPATIENT_CLINIC_OR_DEPARTMENT_OTHER)
Admission: RE | Admit: 2021-09-28 | Discharge: 2021-09-28 | Disposition: A | Discharge status: Home / Self Care | Payer: Medicare Other | Source: Ambulatory Visit | Attending: Cardiology | Admitting: Cardiology

## 2021-09-28 ENCOUNTER — Other Ambulatory Visit: Payer: Medicare Other

## 2021-09-28 ENCOUNTER — Encounter (HOSPITAL_BASED_OUTPATIENT_CLINIC_OR_DEPARTMENT_OTHER): Payer: Self-pay

## 2021-09-28 ENCOUNTER — Ambulatory Visit (HOSPITAL_BASED_OUTPATIENT_CLINIC_OR_DEPARTMENT_OTHER)
Admission: RE | Admit: 2021-09-28 | Discharge: 2021-09-28 | Disposition: A | Discharge status: Home / Self Care | Payer: Medicare Other | Source: Ambulatory Visit | Attending: Physician Assistant | Admitting: Physician Assistant

## 2021-09-28 DIAGNOSIS — I6521 Occlusion and stenosis of right carotid artery: Secondary | ICD-10-CM | POA: Insufficient documentation

## 2021-09-28 DIAGNOSIS — I2510 Atherosclerotic heart disease of native coronary artery without angina pectoris: Secondary | ICD-10-CM | POA: Insufficient documentation

## 2021-09-28 DIAGNOSIS — I6523 Occlusion and stenosis of bilateral carotid arteries: Secondary | ICD-10-CM | POA: Diagnosis not present

## 2021-10-13 ENCOUNTER — Ambulatory Visit: Payer: Medicare Other | Admitting: Cardiology

## 2021-10-19 ENCOUNTER — Other Ambulatory Visit: Payer: Self-pay | Admitting: Medical

## 2021-10-19 ENCOUNTER — Inpatient Hospital Stay: Admission: RE | Admit: 2021-10-19 | Payer: Medicare Other | Source: Ambulatory Visit

## 2021-10-20 ENCOUNTER — Ambulatory Visit: Payer: Medicare Other | Admitting: Cardiology

## 2021-10-20 ENCOUNTER — Encounter: Payer: Self-pay | Admitting: Cardiology

## 2021-10-20 VITALS — BP 110/70 | HR 94 | Ht 71.0 in | Wt 194.2 lb

## 2021-10-20 DIAGNOSIS — I2510 Atherosclerotic heart disease of native coronary artery without angina pectoris: Secondary | ICD-10-CM

## 2021-10-20 DIAGNOSIS — I10 Essential (primary) hypertension: Secondary | ICD-10-CM | POA: Diagnosis not present

## 2021-10-20 DIAGNOSIS — I330 Acute and subacute infective endocarditis: Secondary | ICD-10-CM

## 2021-10-20 DIAGNOSIS — I480 Paroxysmal atrial fibrillation: Secondary | ICD-10-CM

## 2021-10-20 DIAGNOSIS — G4733 Obstructive sleep apnea (adult) (pediatric): Secondary | ICD-10-CM

## 2021-10-20 DIAGNOSIS — Z8679 Personal history of other diseases of the circulatory system: Secondary | ICD-10-CM

## 2021-10-20 DIAGNOSIS — G629 Polyneuropathy, unspecified: Secondary | ICD-10-CM | POA: Diagnosis not present

## 2021-10-20 DIAGNOSIS — J84112 Idiopathic pulmonary fibrosis: Secondary | ICD-10-CM

## 2021-10-20 DIAGNOSIS — Z9889 Other specified postprocedural states: Secondary | ICD-10-CM

## 2021-10-20 DIAGNOSIS — E785 Hyperlipidemia, unspecified: Secondary | ICD-10-CM

## 2021-10-20 NOTE — Progress Notes (Signed)
Cardiology Office Note:    Date:  10/20/2021   ID:  [name], DOB 1950-04-07, MRN [id number]  PCP:  Mackie Pai, [name]  Cardiologist:  Jenne Campus, MD    Referring MD: Mackie Pai, [place]   Chief Complaint  Patient presents with   Shortness of Breath         severe fatigue     History of Present Illness:    Robert Lang is a 71 y.o. male with past medical history significant for coronary artery disease status post PTCA and stenting of LAD and RCA years ago, cardiac catheterization done in 2017 patent stent of proximal LAD, 90% occlusion of the small first diagonal branch 25% occlusion of the RCA with good ejection fraction of 60%, he also had atrial flutter ablation done in [facility] in 2017.  Last stress test done in 2020 showing no significant ischemia.  In 2020 October he did have PTCA and stenting with drug-eluting stent to RCA that was in face of non-STEMI.  In April he had echocardiogram done which surprisingly showed some lesion on the aortic valve raising suspicion for potential endocarditis.  However he did not have any signs and symptoms of endocarditis there was no evidence of infection also aortic valve did not have any regurgitation.  Recently he ended up having another echocardiogram done in June and that echocardiogram did not show any vegetation of the aortic valve from stable force competent.  There was some contemplation about potentially doing a transesophageal echocardiogram however this idea has being abandoned secondary to presence of idiopathic pulmonary fibrosis and poor lung condition.  Look like we do have permission to do the test from pulmonary point review however overall the fact he does not have any evidence of active infection and his valve is still competent to function properly make me to think that better watchful waiting is better approach to an aggressive TEE.  I will schedule him however to have echocardiogram done at the end of this month at  the beginning of September.  Recently he had up going to the hospital because of episode of syncope.  He was felt to be related to dehydration he had a head CT EEG both were negative.  He also carotic ultrasound. Comes today to my office for follow-up complain of being weak tired exhausted.  Shortness of breath is still there which is undoubtedly related to COPD and his pulmonary fibrosis.  He brought interesting issue today to our discussion his father apparently died because of rupture and was he went to make sure none of this will happen to him I did review his CT of his chest done in April which showed normal size of his thoracic aorta, I also had a chance to review his CT of her abdomen done in February of this year which showed no enlargement of the aorta.  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 M75.00)   Allergic rhinitis due to pollen 09/03/2013   J30.1)  Formatting of this note might be different from the original. J30.1)   Allergy    Anticoagulated 07/01/2016   Anxiety    Arthritis    Atrial fibrillation ([place])    Atrial flutter ([place]) 06/26/2015   CAD (coronary artery disease)    Chest  pain 06/26/2015   COPD (chronic obstructive pulmonary disease) ([place])    Coronary artery disease 07/06/2018   Cardiac catheterization 2017 showing 90% small diagonal branch disease   DDD (degenerative disc disease), cervical 09/03/2013   M50.90)  Formatting of this note might be different from the original. M50.90)   Depression    Depression    Depressive disorder 09/03/2013   Dizziness 10/28/2017   Dyslipidemia, goal LDL below 70 07/06/2018   Dyspnea on exertion 10/20/2018   Esophageal reflux 09/03/2013   Essential hypertension 07/06/2018   ETOH abuse  01/11/2019   6 pack of beer per day   Falls 10/28/2017   GERD (gastroesophageal reflux disease)    H/O amiodarone therapy 07/01/2016   Heart attack ([place])    Heart palpitations 01/27/2017   History of colon polyps    Hyperlipidemia    Hypertension    IPF (idiopathic pulmonary fibrosis) ([place])    Kidney stones    Neck pain 09/03/2013   Neuropathy 07/06/2018   Obstructive sleep apnea 08/05/2015   PLMD (periodic limb movement disorder) 11/04/2015   Polycythemia, 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 STORY: Due to alcohol/ tobacco   Pure hypercholesterolemia 09/03/2013   E78.0)  Formatting of this note might be different from the original. E78.0)   Screening for prostate cancer 09/03/2013   Smoker 01/11/2019   Smoking 07/06/2018   Status post ablation of atrial flutter 07/06/2018   2017    Past Surgical History:  Procedure Laterality Date   ATRIAL FIBRILLATION ABLATION  10/2015   BACK SURGERY     CARDIOVERSION  2017   CATARACT EXTRACTION Bilateral 2017   March and April 2017   COLONOSCOPY  2018   CORONARY STENT PLACEMENT  2012   CORONARY/GRAFT ACUTE MI REVASCULARIZATION N/A 01/10/2019   Procedure: CORONARY/GRAFT ACUTE MI REVASCULARIZATION;  Surgeon: Leonie Man, MD;  Location: [facility];  Service: Cardiovascular;  Laterality: N/A;   INGUINAL HERNIA REPAIR     over 20 years ago   LAPAROSCOPIC APPENDECTOMY N/A 01/17/2021   Procedure: APPENDECTOMY LAPAROSCOPIC;  Surgeon: Felicie Morn, MD;  Location: [facility];  Service: General;  Laterality: N/A;   LEFT HEART CATH AND CORONARY ANGIOGRAPHY N/A 01/10/2019   Procedure: LEFT HEART CATH AND CORONARY ANGIOGRAPHY;  Surgeon: Leonie Man, MD;  Location: [facility];  Service: Cardiovascular;  Laterality: N/A;   LUMBAR LAMINECTOMY Bilateral 04/05/2016   L2-L5    VENTRAL HERNIA REPAIR  2018    Current Medications: Current Meds   Medication Sig   acetaminophen (TYLENOL) 325 MG tablet Take 2 tablets (650 mg total) by mouth every 6 (six) hours as needed. (Patient taking differently: Take 650 mg by mouth every 6 (six) hours as needed for mild pain or moderate pain.)   atorvastatin (LIPITOR) 80 MG tablet Take 1 tablet (80 mg total) by mouth at bedtime.   buPROPion (WELLBUTRIN XL) 300 MG 24 hr tablet Take 1 tablet (300 mg total) by mouth daily.   busPIRone (BUSPAR) 15 MG tablet Take 15 mg by mouth 2 (two) times daily.   Cholecalciferol 25 MCG (1000 UT) tablet Take 1,000 Units by mouth daily.    docusate sodium (COLACE) 100 MG capsule Take 100 mg by mouth daily.   famotidine (PEPCID) 40 MG tablet Take 40 mg by mouth at bedtime.   finasteride (PROSCAR) 5 MG tablet Take 1 tablet (5 mg total) by  mouth daily.   fluticasone (FLONASE) 50 MCG/ACT nasal spray Place 1 spray into both nostrils daily as needed for allergies or rhinitis.   gabapentin (NEURONTIN) 100 MG capsule TAKE 4 CAPSULES BY MOUTH AT BEDTIME (Patient taking differently: Take 400 mg by mouth at bedtime. TAKE 4 CAPSULES BY MOUTH AT BEDTIME)   Ginger, Zingiber officinalis, (GINGER ROOT) 550 MG CAPS Take 550 mg by mouth daily.   melatonin 5 MG TABS Take 10 mg by mouth at bedtime.   Multiple Vitamins-Minerals (PRESERVISION AREDS PO) Take 1 capsule by mouth in the morning and at bedtime.   nitroGLYCERIN (NITROSTAT) 0.4 MG SL tablet Place 1 tablet (0.4 mg total) under the tongue every 5 (five) minutes as needed for chest pain.   NON FORMULARY 1 each by Other route See admin instructions. CPAP at bedtime   ondansetron (ZOFRAN) 4 MG tablet TAKE 1 TABLET(4 MG) BY MOUTH EVERY 8 HOURS AS NEEDED FOR NAUSEA OR VOMITING (Patient taking differently: Take 4 mg by mouth every 8 (eight) hours as needed for nausea or vomiting.)   pantoprazole (PROTONIX) 40 MG tablet Take 1 tablet (40 mg total) by mouth daily.   Pirfenidone (ESBRIET) 801 MG TABS Take 801 mg by mouth 3 (three) times daily.    Polyethylene Glycol 3350 (MIRALAX PO) Take 17 g by mouth daily.   ranolazine (RANEXA) 1000 MG SR tablet Take 1 tablet (1,000 mg total) by mouth 2 (two) times daily.   rivaroxaban (XARELTO) 20 MG TABS tablet TAKE 1 TABLET(20 MG) BY MOUTH DAILY WITH SUPPER (Patient taking differently: Take 20 mg by mouth daily with supper.)   sertraline (ZOLOFT) 100 MG tablet Take 100 mg by mouth at bedtime.   Tiotropium Bromide-Olodaterol (STIOLTO RESPIMAT) 2.5-2.5 MCG/ACT AERS Inhale 2 puffs into the lungs daily.   traZODone (DESYREL) 50 MG tablet TAKE 1/2 TO 1 TABLET(25 TO 50 MG) BY MOUTH AT BEDTIME AS NEEDED FOR SLEEP (Patient taking differently: Take 25-50 mg by mouth at bedtime as needed for sleep.)   vitamin B-12 (CYANOCOBALAMIN) 1000 MCG tablet Take 1,000 mcg by mouth daily.     Allergies:   Naproxen and Silodosin   Social History   Socioeconomic History   Marital status: Married    Spouse name: Not on file   Number of children: Not on file   Years of education: Not on file   Highest education level: Not on file  Occupational History   Not on file  Tobacco Use   Smoking status: Former    Packs/day: 1.00    Years: 30.00    Total pack years: 30.00    Types: Cigarettes    Quit date: 01/10/2019    Years since quitting: 2.7   Smokeless tobacco: Former  Scientific laboratory technician Use: Never used  Substance and Sexual Activity   Alcohol use: Yes    Comment: 8oz of wine a day   Drug use: Never   Sexual activity: Not on file  Other Topics Concern   Not on file  Social History Narrative   Not on file   Social Determinants of Health   Financial Resource Strain: Low Risk  (11/25/2020)   Overall Financial Resource Strain (CARDIA)    Difficulty of Paying Living Expenses: Not hard at all  Food Insecurity: No Food Insecurity (11/25/2020)   Hunger Vital Sign    Worried About Running Out of Food in the Last Year: Never true    [place] in the Last Year: Never true  Transportation Needs: No  Transportation Needs (11/25/2020)   PRAPARE - Hydrologist (Medical): No    Lack of Transportation (Non-Medical): No  Physical Activity: Inactive (11/25/2020)   Exercise Vital Sign    Days of Exercise per Week: 0 days    Minutes of Exercise per Session: 0 min  Stress: No Stress Concern Present (11/25/2020)   [place]    Feeling of Stress : Not at all  Social Connections: Moderately Integrated (11/25/2020)   Social Connection and Isolation Panel [NHANES]    Frequency of Communication with Friends and Family: More than three times a week    Frequency of Social Gatherings with Friends and Family: More than three times a week    Attends Religious Services: More than 4 times per year    Active Member of [employer] or Organizations: No    Attends Archivist Meetings: Never    Marital Status: Married     Family History: The patient's family history includes Colon cancer in his father; Throat cancer in his brother. ROS:   Please see the history of present illness.    All 14 point review of systems negative except as described per history of present illness  EKGs/Labs/Other Studies Reviewed:      Recent Labs: 01/16/2021: TSH 1.232 08/26/2021: Magnesium 2.2 09/02/2021: ALT 20; Brain Natriuretic Peptide 37; BUN 14; Creat 0.92; Hemoglobin 14.9; Platelets 278; Potassium 4.7; Sodium 136  Recent Lipid Panel    Component Value Date/Time   CHOL 138 10/13/2020 0923   CHOL 128 04/03/2020 0951   TRIG 380.0 (H) 10/13/2020 0923   HDL 28.60 (L) 10/13/2020 0923   HDL 40 04/03/2020 0951   CHOLHDL 5 10/13/2020 0923   VLDL 76.0 (H) 10/13/2020 0923   LDLCALC 58 04/03/2020 0951   LDLDIRECT 66.0 10/13/2020 0923    Physical Exam:    VS:  BP 110/70 (BP Location: Left Arm, Patient Position: Sitting)   Pulse 94   Ht '5\' 11"'$  (1.803 m)   Wt 194 lb 3.2 oz (88.1 kg)   SpO2 95%   BMI 27.09 kg/m     Wt  Readings from Last 3 Encounters:  10/20/21 194 lb 3.2 oz (88.1 kg)  09/24/21 188 lb 3.2 oz (85.4 kg)  09/16/21 195 lb (88.5 kg)     GEN:  Well nourished, well developed in no acute distress HEENT: Normal NECK: No JVD; No carotid bruits LYMPHATICS: No lymphadenopathy CARDIAC: RRR, no murmurs, no rubs, no gallops RESPIRATORY:  Clear to auscultation without rales, wheezing or rhonchi  ABDOMEN: Soft, non-tender, non-distended MUSCULOSKELETAL:  No edema; No deformity  SKIN: Warm and dry LOWER EXTREMITIES: no swelling NEUROLOGIC:  Alert and oriented x 3 PSYCHIATRIC:  Normal affect   ASSESSMENT:    1. Coronary artery disease involving native coronary artery of native heart without angina pectoris   2. Essential hypertension   3. Neuropathy   4. Paroxysmal atrial fibrillation (HCC)   5. Vegetation of heart valve   6. IPF (idiopathic pulmonary fibrosis) ([place])   7. OSA (obstructive sleep apnea)   8. Dyslipidemia, goal LDL below 70   9. Status post ablation of atrial flutter    PLAN:    In order of problems listed above:  Coronary disease stable from that point review and appropriate medications doing well.  We will continue present management Dyslipidemia he is on statin.  I did review his K PN which show me his  LDL of 58 HDL 28.  This is from this year he is on Lipitor 56 which I will continue. Neuropathy of lower extremities which bothers him a lot.  He had difficulty sleeping because of that.  He is already on Neurontin however helping somewhat he will be referred to neurology to help with this problem. Idiopathic pulmonary fibrosis: Followed by internal medicine team and pulmonary. Questionable vegetation of the aortic valve does not have any signs and symptoms of endocarditis last echocardiogram did not show any vegetation with a competent valve.  We will repeat echocardiogram within the next few weeks to follow-up.   Medication Adjustments/Labs and Tests Ordered: Current  medicines are reviewed at length with the patient today.  Concerns regarding medicines are outlined above.  Orders Placed This Encounter  Procedures   Ambulatory referral to Neurology   ECHOCARDIOGRAM COMPLETE   Medication changes: No orders of the defined types were placed in this encounter.   Signed, Park Liter, MD, [facility] 10/20/2021 10:30 AM    [place]

## 2021-10-20 NOTE — Patient Instructions (Signed)
Medication Instructions:  Your physician recommends that you continue on your current medications as directed. Please refer to the Current Medication list given to you today.  *If you need a refill on your cardiac medications before your next appointment, please call your pharmacy*   Lab Work: NONE If you have labs (blood work) drawn today and your tests are completely normal, you will receive your results only by: [place] (if you have MyChart) OR A paper copy in the mail If you have any lab test that is abnormal or we need to change your treatment, we will call you to review the results.   Testing/Procedures: Your physician has requested that you have an echocardiogram. Echocardiography is a painless test that uses sound waves to create images of your heart. It provides your doctor with information about the size and shape of your heart and how well your heart's chambers and valves are working. This procedure takes approximately one hour. There are no restrictions for this procedure.    Follow-Up: At [facility], you and your health needs are our priority.  As part of our continuing mission to provide you with exceptional heart care, we have created designated Provider Care Teams.  These Care Teams include your primary Cardiologist (physician) and Advanced Practice Providers (APPs -  Physician Assistants and Nurse Practitioners) who all work together to provide you with the care you need, when you need it.  We recommend signing up for the patient portal called "MyChart".  Sign up information is provided on this After Visit Summary.  MyChart is used to connect with patients for Virtual Visits (Telemedicine).  Patients are able to view lab/test results, encounter notes, upcoming appointments, etc.  Non-urgent messages can be sent to your provider as well.   To learn more about what you can do with MyChart, go to [email or web address].    Your next appointment:   3 month(s)  The  format for your next appointment:   In Person  Provider:   Jenne Campus, MD    Other Instructions   Important Information About [place]

## 2021-10-22 ENCOUNTER — Telehealth: Payer: Self-pay | Admitting: Cardiology

## 2021-10-22 NOTE — Telephone Encounter (Signed)
Referral sent to requested location on 10/20/2021

## 2021-10-22 NOTE — Telephone Encounter (Signed)
Patient calling asking that a referral be send to the Urologist on [address] for him. Please advise

## 2021-10-27 ENCOUNTER — Telehealth: Payer: Self-pay | Admitting: Cardiology

## 2021-10-27 ENCOUNTER — Telehealth: Payer: Self-pay

## 2021-10-27 DIAGNOSIS — G629 Polyneuropathy, unspecified: Secondary | ICD-10-CM

## 2021-10-27 NOTE — Telephone Encounter (Signed)
New Message:    Patient said Dr Raliegh Ip had referred him to Dr April Manson.  They said they need Dr Raliegh Ip to fax a referral to [phone] please. Thhis referral for his feet and they need a new referral.

## 2021-10-27 NOTE — Telephone Encounter (Signed)
Referral sent to Neurology for Neuropathy follow up

## 2021-11-18 ENCOUNTER — Ambulatory Visit: Payer: Medicare Other | Admitting: Neurology

## 2021-11-18 ENCOUNTER — Encounter: Payer: Self-pay | Admitting: Neurology

## 2021-11-18 VITALS — BP 115/66 | HR 93 | Ht 71.0 in | Wt 194.0 lb

## 2021-11-18 DIAGNOSIS — M5416 Radiculopathy, lumbar region: Secondary | ICD-10-CM | POA: Diagnosis not present

## 2021-11-18 DIAGNOSIS — G629 Polyneuropathy, unspecified: Secondary | ICD-10-CM

## 2021-11-18 MED ORDER — PREGABALIN 150 MG PO CAPS: 150.0000 mg | ORAL_CAPSULE | Freq: Two times a day (BID) | ORAL | 5 refills | Status: DC

## 2021-11-18 NOTE — Progress Notes (Signed)
[facility]  PATIENT: Robert Lang DOB: 02/03/1951  REQUESTING CLINICIAN: Park Liter, MD HISTORY FROM: Patient and spouse  REASON FOR VISIT: Recurrent syncope    HISTORICAL  CHIEF COMPLAINT:  Chief Complaint  Patient presents with   New Patient (Initial Visit)    Room 12, with wife  Neuropathy is both feet   INTERVAL HISTORY 11/18/2021:  Patient presents today for follow-up, since last visit on 6/27, he denies any additional falls.  His current complaint right now is bilateral feet numbness, and pain.  He reports being diagnosed with neuropathy for many years but lately he has been experiencing burning pain in both feet.  The pain will keep him up at night.  He is on gabapentin 400 mg at nighttime but sometimes he would take extra dose in the middle of the night if the pain wakes him up.  Denies any involvement of the legs, but reports at times he will have sharp shooting pain that starts from the left-sided back all the way down to the toes.   HISTORY OF PRESENT ILLNESS:  This is a 71 year old male with multiple medical conditions including hypertension, hyperlipidemia, atrial fibrillation, COPD, pulmonary fibrosis who is presenting after being admitted to the hospital after multiple syncopal episodes.  Patient reports in end of May he got tested positive for COVID, he was having symptoms and was started on Plaxovid which he completed on June 4.  During this time while he was battling COVID, he did have generalized weakness, he actually had a fall and had difficulty getting of the floor, and on June 6 while being out with his wife had a syncopal episode while in the car.  Prior to the syncope he did complain of dizziness and feeling weak. He presented to [facility], admitted from the 6 to the 7 for syncope, initial work-up including MRI Brain was negative for any etiology of the syncope. On June 9 he presented again to [facility] hospital for 2 additional  syncope, prior to the episode he reported feeling dizzy.  In the ED he had 1 syncopal episode which was witnessed and there is report of left leg shaking with the syncope. There was no associated urinary incontinence, no tongue biting and no postictal fusion Again patient was admitted, work-up was unrevealing, he did have overnight EEG which was negative.  Again he was diagnosed with convulsive syncope likely due to dehydration and related to recent COVID infection. Since being discharged from the hospital, he reports feeling better, doing pretty good, he feels weak, feel drained but again no additional syncopal episode.  He is scheduled to see his pulmonologist on 5 July    OTHER MEDICAL CONDITIONS: Pulmonary fibrosis, depression/anxiety, hypertension, hyperlipidemia, atrial fibrillation, COPD, obstructive sleep apnea on CPAP   REVIEW OF SYSTEMS: Full 14 system review of systems performed and negative with exception of: as noted in the HPI   ALLERGIES: Allergies  Allergen Reactions   Naproxen Other (See Comments)    (Naprosyn *ANALGESICS - ANTI-INFLAMMATORY*) Nausea, Abdominal pain   Silodosin Other (See Comments)    Low blood pressure    HOME MEDICATIONS: Outpatient Medications Prior to Visit  Medication Sig Dispense Refill   acetaminophen (TYLENOL) 325 MG tablet Take 2 tablets (650 mg total) by mouth every 6 (six) hours as needed. (Patient taking differently: Take 650 mg by mouth every 6 (six) hours as needed for mild pain or moderate pain.)     atorvastatin (LIPITOR) 80 MG tablet Take 1 tablet (  80 mg total) by mouth at bedtime. 90 tablet 3   buPROPion (WELLBUTRIN XL) 300 MG 24 hr tablet Take 1 tablet (300 mg total) by mouth daily. 90 tablet 0   busPIRone (BUSPAR) 15 MG tablet Take 15 mg by mouth 2 (two) times daily.     Cholecalciferol 25 MCG (1000 UT) tablet Take 1,000 Units by mouth daily.      docusate sodium (COLACE) 100 MG capsule Take 100 mg by mouth daily.     famotidine  (PEPCID) 40 MG tablet Take 40 mg by mouth at bedtime.     fexofenadine (ALLEGRA) 180 MG tablet Take 180 mg by mouth at bedtime.      finasteride (PROSCAR) 5 MG tablet Take 1 tablet (5 mg total) by mouth daily. 90 tablet 1   fluticasone (FLONASE) 50 MCG/ACT nasal spray Place 1 spray into both nostrils daily as needed for allergies or rhinitis.     Ginger, Zingiber officinalis, (GINGER ROOT) 550 MG CAPS Take 550 mg by mouth daily.     melatonin 5 MG TABS Take 10 mg by mouth at bedtime.     Multiple Vitamins-Minerals (PRESERVISION AREDS PO) Take 1 capsule by mouth in the morning and at bedtime.     nitroGLYCERIN (NITROSTAT) 0.4 MG SL tablet Place 1 tablet (0.4 mg total) under the tongue every 5 (five) minutes as needed for chest pain. 25 tablet 3   NON FORMULARY 1 each by Other route See admin instructions. CPAP at bedtime     ondansetron (ZOFRAN) 4 MG tablet TAKE 1 TABLET(4 MG) BY MOUTH EVERY 8 HOURS AS NEEDED FOR NAUSEA OR VOMITING (Patient taking differently: Take 4 mg by mouth every 8 (eight) hours as needed for nausea or vomiting.) 20 tablet 0   pantoprazole (PROTONIX) 40 MG tablet Take 1 tablet (40 mg total) by mouth daily. 90 tablet 3   Pirfenidone (ESBRIET) 801 MG TABS Take 801 mg by mouth 3 (three) times daily. 270 tablet 1   Polyethylene Glycol 3350 (MIRALAX PO) Take 17 g by mouth daily.     ranolazine (RANEXA) 1000 MG SR tablet Take 1 tablet (1,000 mg total) by mouth 2 (two) times daily. 180 tablet 2   rivaroxaban (XARELTO) 20 MG TABS tablet TAKE 1 TABLET(20 MG) BY MOUTH DAILY WITH SUPPER (Patient taking differently: Take 20 mg by mouth daily with supper.) 30 tablet 5   sertraline (ZOLOFT) 100 MG tablet Take 100 mg by mouth at bedtime.     Tiotropium Bromide-Olodaterol (STIOLTO RESPIMAT) 2.5-2.5 MCG/ACT AERS Inhale 2 puffs into the lungs daily. 4 g 5   traZODone (DESYREL) 50 MG tablet TAKE 1/2 TO 1 TABLET(25 TO 50 MG) BY MOUTH AT BEDTIME AS NEEDED FOR SLEEP (Patient taking differently: Take  25-50 mg by mouth at bedtime as needed for sleep.) 30 tablet 3   vitamin B-12 (CYANOCOBALAMIN) 1000 MCG tablet Take 1,000 mcg by mouth daily.     gabapentin (NEURONTIN) 100 MG capsule TAKE 4 CAPSULES BY MOUTH AT BEDTIME (Patient taking differently: Take 400 mg by mouth at bedtime. TAKE 4 CAPSULES BY MOUTH AT BEDTIME) 120 capsule 3   No facility-administered medications prior to visit.    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 M75.00)   Allergic rhinitis due to pollen 09/03/2013   J30.1)  Formatting of this note might be different from the original. J30.1)   Allergy    Anticoagulated 07/01/2016   Anxiety    Arthritis    Atrial fibrillation ([place])    Atrial flutter ([place]) 06/26/2015   CAD (coronary artery disease)    Chest pain 06/26/2015   COPD (chronic obstructive pulmonary disease) ([place])    Coronary artery disease 07/06/2018   Cardiac catheterization 2017 showing 90% small diagonal branch disease   DDD (degenerative disc disease), cervical 09/03/2013   M50.90)  Formatting of this note might be different from the original. M50.90)   Depression    Depression    Depressive disorder 09/03/2013   Dizziness 10/28/2017   Dyslipidemia, goal LDL below 70 07/06/2018   Dyspnea on exertion 10/20/2018   Esophageal reflux 09/03/2013   Essential hypertension 07/06/2018   ETOH abuse 01/11/2019   6 pack of beer per day   Falls 10/28/2017   GERD (gastroesophageal reflux disease)    H/O amiodarone therapy 07/01/2016   Heart attack ([place])    Heart palpitations 01/27/2017   History of colon polyps    Hyperlipidemia    Hypertension    IPF (idiopathic pulmonary fibrosis) ([place])    Kidney stones    Neck pain 09/03/2013   Neuropathy 07/06/2018   Obstructive sleep apnea  08/05/2015   PLMD (periodic limb movement disorder) 11/04/2015   Polycythemia, 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 STORY: Due to alcohol/ tobacco   Pure hypercholesterolemia 09/03/2013   E78.0)  Formatting of this note might be different from the original. E78.0)   Screening for prostate cancer 09/03/2013   Smoker 01/11/2019   Smoking 07/06/2018   Status post ablation of atrial flutter 07/06/2018   2017    PAST SURGICAL HISTORY: Past Surgical History:  Procedure Laterality Date   ATRIAL FIBRILLATION ABLATION  10/2015   BACK SURGERY     CARDIOVERSION  2017   CATARACT EXTRACTION Bilateral 2017   March and April 2017   COLONOSCOPY  2018   CORONARY STENT PLACEMENT  2012   CORONARY/GRAFT ACUTE MI REVASCULARIZATION N/A 01/10/2019   Procedure: CORONARY/GRAFT ACUTE MI REVASCULARIZATION;  Surgeon: Leonie Man, MD;  Location: [place] [facility];  Service: Cardiovascular;  Laterality: N/A;   INGUINAL HERNIA REPAIR     over 20 years ago   LAPAROSCOPIC APPENDECTOMY N/A 01/17/2021   Procedure: APPENDECTOMY LAPAROSCOPIC;  Surgeon: Felicie Morn, MD;  Location: [place];  Service: General;  Laterality: N/A;   LEFT HEART CATH AND CORONARY ANGIOGRAPHY N/A 01/10/2019   Procedure: LEFT HEART CATH AND CORONARY ANGIOGRAPHY;  Surgeon: Leonie Man, MD;  Location: [place] [facility];  Service: Cardiovascular;  Laterality: N/A;   LUMBAR LAMINECTOMY Bilateral 04/05/2016   L2-L5    VENTRAL HERNIA REPAIR  2018    FAMILY HISTORY: Family History  Problem Relation Age of Onset   Colon cancer Father    Throat cancer Brother     SOCIAL HISTORY: Social History   Socioeconomic History   Marital status: Married    Spouse name: Not on file   Number of children: Not on file   Years of education: Not on file   Highest education level: Not on file  Occupational History   Not on file  Tobacco Use   Smoking  status: Former    Packs/day: 1.00    Years: 30.00    Total pack years: 30.00  Types: Cigarettes    Quit date: 01/10/2019    Years since quitting: 2.8   Smokeless tobacco: Former  Scientific laboratory technician Use: Never used  Substance and Sexual Activity   Alcohol use: Yes    Comment: 8oz of wine a day   Drug use: Never   Sexual activity: Not on file  Other Topics Concern   Not on file  Social History Narrative   Not on file   Social Determinants of Health   Financial Resource Strain: Low Risk  (11/25/2020)   Overall Financial Resource Strain (CARDIA)    Difficulty of Paying Living Expenses: Not hard at all  Food Insecurity: No Food Insecurity (11/25/2020)   Hunger Vital Sign    Worried About Running Out of Food in the Last Year: Never true    [place] in the Last Year: Never true  Transportation Needs: No Transportation Needs (11/25/2020)   PRAPARE - Hydrologist (Medical): No    Lack of Transportation (Non-Medical): No  Physical Activity: Inactive (11/25/2020)   Exercise Vital Sign    Days of Exercise per Week: 0 days    Minutes of Exercise per Session: 0 min  Stress: No Stress Concern Present (11/25/2020)   [place]    Feeling of Stress : Not at all  Social Connections: Moderately Integrated (11/25/2020)   Social Connection and Isolation Panel [NHANES]    Frequency of Communication with Friends and Family: More than three times a week    Frequency of Social Gatherings with Friends and Family: More than three times a week    Attends Religious Services: More than 4 times per year    Active Member of [employer] or Organizations: No    Attends Archivist Meetings: Never    Marital Status: Married  Human resources officer Violence: Not At Risk (11/25/2020)   Humiliation, Afraid, Rape, and Kick questionnaire    Fear of Current or Ex-Partner: No    Emotionally Abused: No    Physically  Abused: No    Sexually Abused: No    PHYSICAL EXAM  GENERAL EXAM/CONSTITUTIONAL: Vitals:  Vitals:   11/18/21 1048  BP: 115/66  Pulse: 93  Weight: 194 lb (88 kg)  Height: '5\' 11"'$  (1.803 m)    Body mass index is 27.06 kg/m. Wt Readings from Last 3 Encounters:  11/18/21 194 lb (88 kg)  10/20/21 194 lb 3.2 oz (88.1 kg)  09/24/21 188 lb 3.2 oz (85.4 kg)   Patient is in no distress; well developed, nourished and groomed; neck is supple, has to stop mid sentence to take a breath  EYES: Pupils round and reactive to light, Visual fields full to confrontation, Extraocular movements intacts,   MUSCULOSKELETAL: Gait, strength, tone, movements noted in Neurologic exam below  NEUROLOGIC: MENTAL STATUS:      No data to display         awake, alert, oriented to person, place and time recent and remote memory intact normal attention and concentration language fluent, comprehension intact, naming intact fund of knowledge appropriate  CRANIAL NERVE:  2nd, 3rd, 4th, 6th - pupils equal and reactive to light, visual fields full to confrontation, extraocular muscles intact, no nystagmus 5th - facial sensation symmetric 7th - facial strength symmetric 8th - hearing intact 9th - palate elevates symmetrically, uvula midline 11th - shoulder shrug symmetric 12th - tongue protrusion midline  MOTOR:  normal bulk and tone, full  strength in the BUE, BLE  SENSORY:  Decrease light touch, pinprick and vibration all the way to ankle bilaterally. He also has absent proprioception.      COORDINATION:  finger-nose-finger, fine finger movements normal   GAIT   Ambulates with a cane    DIAGNOSTIC DATA (LABS, IMAGING, TESTING) - I reviewed patient records, labs, notes, testing and imaging myself where available.  Lab Results  Component Value Date   WBC 7.4 09/02/2021   HGB 14.9 09/02/2021   HCT 42.4 09/02/2021   MCV 96.8 09/02/2021   PLT 278 09/02/2021      Component Value  Date/Time   NA 136 09/02/2021 1541   NA 137 03/05/2020 1343   K 4.7 09/02/2021 1541   CL 101 09/02/2021 1541   CO2 27 09/02/2021 1541   GLUCOSE 84 09/02/2021 1541   BUN 14 09/02/2021 1541   BUN 14 03/05/2020 1343   CREATININE 0.92 09/02/2021 1541   CALCIUM 9.7 09/02/2021 1541   PROT 7.1 09/02/2021 1541   PROT 6.9 04/20/2019 0807   ALBUMIN 3.5 08/26/2021 1826   ALBUMIN 4.3 04/20/2019 0807   AST 14 09/02/2021 1541   ALT 20 09/02/2021 1541   ALKPHOS 70 08/26/2021 1826   BILITOT 0.4 09/02/2021 1541   BILITOT 0.5 04/20/2019 0807   GFRNONAA >60 08/28/2021 0443   GFRAA 81 03/05/2020 1343   Lab Results  Component Value Date   CHOL 138 10/13/2020   HDL 28.60 (L) 10/13/2020   LDLCALC 58 04/03/2020   LDLDIRECT 66.0 10/13/2020   TRIG 380.0 (H) 10/13/2020   CHOLHDL 5 10/13/2020   Lab Results  Component Value Date   HGBA1C 5.1 01/25/2020   Lab Results  Component Value Date   [id number] 676 09/02/2021   Lab Results  Component Value Date   TSH 1.232 01/16/2021    Head CT 08/26/21:  1. No CT evidence for acute intracranial abnormality. 2. Atrophy and chronic small vessel ischemic changes of the white matter  MRI Brain 08/25/21 1. No acute intracranial abnormality.  2. Findings of chronic small vessel ischemia and generalized cerebral volume loss   LTM EEG  This study is within normal limits. 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 year old male with multiple medical conditions including hypertension, hyperlipidemia, atrial fibrillation, COPD, pulmonary fibrosis who is presenting for burning pain in both feet.  Patient has a diagnosis of peripheral neuropathy and is on gabapentin 400 mg at nighttime but states that his symptoms are worsening.  On exam, he has absent proprioception, decreased  vibration, pinprick and light touch all the way to ankle consistent with a small fiber neuropathy.  I will start patient on pregabalin 150 mg twice daily, will uptitrate as necessary.  On top of that he is also complaining of lumbar radicular pain, will obtain a MRI of his lumbar spine.  Continue to follow with PCP, return in 3 months or sooner if worse.  I will contact you to go over the MRI result.    1. Lumbar radiculopathy   2. Peripheral polyneuropathy   3. Small fiber neuropathy     Patient Instructions  Discontinue gabapentin Start pregabalin 150 mg twice daily, will uptitrate as needed MRI lumbar spine for lumbar radiculopathy signs Continue to follow with PCP Return in 3 months or sooner if worse  Orders Placed This Encounter  Procedures   MR LUMBAR SPINE WO CONTRAST    Meds ordered this encounter  Medications   pregabalin (LYRICA) 150 MG capsule    Sig: Take 1 capsule (150 mg total) by mouth 2 (two) times daily.    Dispense:  60 capsule    Refill:  5    Return in about 3 months (around 02/18/2022).  I have spent a total of 30 minutes dedicated to this patient today, preparing to see patient, performing a medically appropriate examination and evaluation, ordering tests and/or medications and procedures, and counseling and educating the patient/family/caregiver; independently interpreting result and communicating results to the family/patient/caregiver; and documenting clinical information in the electronic medical record.   Alric Ran, MD 11/18/2021, 11:55 AM  [facility] [address] [place], [place] [address] [phone]

## 2021-11-18 NOTE — Patient Instructions (Signed)
Discontinue gabapentin Start pregabalin 150 mg twice daily, will uptitrate as needed MRI lumbar spine for lumbar radiculopathy signs Continue to follow with PCP Return in 3 months or sooner if worse

## 2021-11-25 ENCOUNTER — Encounter: Payer: Self-pay | Admitting: Neurology

## 2021-11-27 ENCOUNTER — Telehealth: Payer: Self-pay | Admitting: Neurology

## 2021-11-27 NOTE — Telephone Encounter (Signed)
[place] for GI [id number] In Progress  Anticipated Determination Date: 12/07/2021

## 2021-12-01 DIAGNOSIS — H35033 Hypertensive retinopathy, bilateral: Secondary | ICD-10-CM | POA: Diagnosis not present

## 2021-12-01 DIAGNOSIS — H31013 Macula scars of posterior pole (postinflammatory) (post-traumatic), bilateral: Secondary | ICD-10-CM | POA: Diagnosis not present

## 2021-12-01 DIAGNOSIS — H43821 Vitreomacular adhesion, right eye: Secondary | ICD-10-CM | POA: Diagnosis not present

## 2021-12-01 DIAGNOSIS — H43812 Vitreous degeneration, left eye: Secondary | ICD-10-CM | POA: Diagnosis not present

## 2021-12-02 NOTE — Telephone Encounter (Signed)
[employer] medicare Robert Lang: [id number] exp. 11/27/2021 - 12/26/2021 sent to GI

## 2021-12-08 ENCOUNTER — Ambulatory Visit
Admission: RE | Admit: 2021-12-08 | Discharge: 2021-12-08 | Disposition: A | Discharge status: Home / Self Care | Payer: Medicare Other | Source: Ambulatory Visit | Attending: Neurology | Admitting: Neurology

## 2021-12-08 DIAGNOSIS — M5416 Radiculopathy, lumbar region: Secondary | ICD-10-CM | POA: Diagnosis not present

## 2021-12-09 ENCOUNTER — Encounter: Payer: Self-pay | Admitting: Neurology

## 2021-12-09 ENCOUNTER — Other Ambulatory Visit: Payer: Self-pay

## 2021-12-09 MED ORDER — PREGABALIN 150 MG PO CAPS: 150.0000 mg | ORAL_CAPSULE | Freq: Three times a day (TID) | ORAL | 5 refills | Status: DC

## 2021-12-09 NOTE — Telephone Encounter (Signed)
Verify Drug Registry For Pregabalin 150 Mg Capsule Last Filled: 11/18/2021 Quantity: 60 capsules for 30 days Last appointment: 11/18/2021 Next appointment: 03/01/2022  The patient's Pregabalin 150 MG was increased from one tablet twice daily to 3 tablets daily. He is requesting a refill.

## 2021-12-16 ENCOUNTER — Ambulatory Visit: Payer: Medicare Other | Attending: Cardiology

## 2021-12-16 DIAGNOSIS — I10 Essential (primary) hypertension: Secondary | ICD-10-CM | POA: Diagnosis not present

## 2021-12-16 DIAGNOSIS — G629 Polyneuropathy, unspecified: Secondary | ICD-10-CM

## 2021-12-16 DIAGNOSIS — I2510 Atherosclerotic heart disease of native coronary artery without angina pectoris: Secondary | ICD-10-CM | POA: Diagnosis not present

## 2021-12-16 LAB — ECHOCARDIOGRAM COMPLETE
Area-P 1/2: 5.06 cm2
Calc EF: 51.6 %
S' Lateral: 3.5 cm
Single Plane A2C EF: 49.8 %
Single Plane A4C EF: 52.3 %

## 2021-12-17 ENCOUNTER — Encounter: Payer: Self-pay | Admitting: *Deleted

## 2021-12-17 NOTE — Progress Notes (Signed)
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 This can cause shooting pain down the legs and feet. Looks like he was recently started on pregabalin which should help his pain. I'll let Dr. April Manson determine next steps from there

## 2021-12-21 ENCOUNTER — Other Ambulatory Visit: Payer: Self-pay | Admitting: Neurology

## 2021-12-21 ENCOUNTER — Telehealth: Payer: Self-pay | Admitting: Internal Medicine

## 2021-12-21 ENCOUNTER — Telehealth: Payer: Self-pay | Admitting: Neurology

## 2021-12-21 DIAGNOSIS — J84112 Idiopathic pulmonary fibrosis: Secondary | ICD-10-CM

## 2021-12-21 DIAGNOSIS — M5416 Radiculopathy, lumbar region: Secondary | ICD-10-CM

## 2021-12-21 MED ORDER — PIRFENIDONE 801 MG PO TABS: 801.0000 mg | ORAL_TABLET | Freq: Three times a day (TID) | ORAL | 1 refills | Status: DC

## 2021-12-21 NOTE — Telephone Encounter (Signed)
Referral for physical therapy sent to [facility]. Phone: [phone], Fax: [phone]

## 2021-12-21 NOTE — Telephone Encounter (Signed)
Refill sent for PIRFENIDONE to [facility], dispensing) for [place]: [phone]  Dose: 801 mg three times daily  Last OV: 09/24/21 Provider: Dr. Chase Caller   Next OV: 01/07/22  Called patient to advise. Advised him to reach out to schedule shipment  Knox Saliva, PharmD, MPH, BCPS Clinical Pharmacist (Rheumatology and Pulmonology)

## 2021-12-22 ENCOUNTER — Telehealth: Payer: Self-pay

## 2021-12-22 ENCOUNTER — Ambulatory Visit (INDEPENDENT_AMBULATORY_CARE_PROVIDER_SITE_OTHER): Payer: Medicare Other

## 2021-12-22 DIAGNOSIS — Z0000 Encounter for general adult medical examination without abnormal findings: Secondary | ICD-10-CM | POA: Diagnosis not present

## 2021-12-22 NOTE — Patient Instructions (Signed)
Robert Lang , Thank you for taking time to come for your Medicare Wellness Visit. I appreciate your ongoing commitment to your health goals. Please review the following plan we discussed and let me know if I can assist you in the future.   These are the goals we discussed:  Goals      Patient Stated     Drink more water & continue eating healthy     Patient Stated     Get out to walk more         This is a list of the screening recommended for you and due dates:  Health Maintenance  Topic Date Due   Hepatitis C Screening: USPSTF Recommendation to screen - Ages 29-79 yo.  Never done   Zoster (Shingles) Vaccine (1 of 2) Never done   COVID-19 Vaccine (6 - Pfizer risk series) 02/09/2021   Tetanus Vaccine  02/17/2021   Flu Shot  10/20/2021   Colon Cancer Screening  07/10/2031   Pneumonia Vaccine  Completed   HPV Vaccine  Aged Out    Advanced directives: Please bring a copy of your health care power of attorney and living will to the office at your convenience.  Conditions/risks identified: get to walking more   Next appointment: Follow up in one year for your annual wellness visit.   Preventive Care 102 Years and Older, Male  Preventive care refers to lifestyle choices and visits with your health care provider that can promote health and wellness. What does preventive care include? A yearly physical exam. This is also called an annual well check. Dental exams once or twice a year. Routine eye exams. Ask your health care provider how often you should have your eyes checked. Personal lifestyle choices, including: Daily care of your teeth and gums. Regular physical activity. Eating a healthy diet. Avoiding tobacco and drug use. Limiting alcohol use. Practicing safe sex. Taking low doses of aspirin every day. Taking vitamin and mineral supplements as recommended by your health care provider. What happens during an annual well check? The services and screenings done by your  health care provider during your annual well check will depend on your age, overall health, lifestyle risk factors, and family history of disease. Counseling  Your health care provider may ask you questions about your: Alcohol use. Tobacco use. Drug use. Emotional well-being. Home and relationship well-being. Sexual activity. Eating habits. History of falls. Memory and ability to understand (cognition). Work and work Statistician. Screening  You may have the following tests or measurements: Height, weight, and BMI. Blood pressure. Lipid and cholesterol levels. These may be checked every 5 years, or more frequently if you are over 4 years old. Skin check. Lung cancer screening. You may have this screening every year starting at age 43 if you have a 30-pack-year history of smoking and currently smoke or have quit within the past 15 years. Fecal occult blood test (FOBT) of the stool. You may have this test every year starting at age 67. Flexible sigmoidoscopy or colonoscopy. You may have a sigmoidoscopy every 5 years or a colonoscopy every 10 years starting at age 19. Prostate cancer screening. Recommendations will vary depending on your family history and other risks. Hepatitis C blood test. Hepatitis B blood test. Sexually transmitted disease (STD) testing. Diabetes screening. This is done by checking your blood sugar (glucose) after you have not eaten for a while (fasting). You may have this done every 1-3 years. Abdominal aortic aneurysm (AAA) screening. You may need  this if you are a current or former smoker. Osteoporosis. You may be screened starting at age 81 if you are at high risk. Talk with your health care provider about your test results, treatment options, and if necessary, the need for more tests. Vaccines  Your health care provider may recommend certain vaccines, such as: Influenza vaccine. This is recommended every year. Tetanus, diphtheria, and acellular pertussis  (Tdap, Td) vaccine. You may need a Td booster every 10 years. Zoster vaccine. You may need this after age 63. Pneumococcal 13-valent conjugate (PCV13) vaccine. One dose is recommended after age 32. Pneumococcal polysaccharide (PPSV23) vaccine. One dose is recommended after age 60. Talk to your health care provider about which screenings and vaccines you need and how often you need them. This information is not intended to replace advice given to you by your health care provider. Make sure you discuss any questions you have with your health care provider. Document Released: 04/04/2015 Document Revised: 11/26/2015 Document Reviewed: 01/07/2015 Elsevier Interactive Patient Education  2017 [place] Prevention in the Home Falls can cause injuries. They can happen to people of all ages. There are many things you can do to make your home safe and to help prevent falls. What can I do on the outside of my home? Regularly fix the edges of walkways and driveways and fix any cracks. Remove anything that might make you trip as you walk through a door, such as a raised step or threshold. Trim any bushes or trees on the path to your home. Use bright outdoor lighting. Clear any walking paths of anything that might make someone trip, such as rocks or tools. Regularly check to see if handrails are loose or broken. Make sure that both sides of any steps have handrails. Any raised decks and porches should have guardrails on the edges. Have any leaves, snow, or ice cleared regularly. Use sand or salt on walking paths during winter. Clean up any spills in your garage right away. This includes oil or grease spills. What can I do in the bathroom? Use night lights. Install grab bars by the toilet and in the tub and shower. Do not use towel bars as grab bars. Use non-skid mats or decals in the tub or shower. If you need to sit down in the shower, use a plastic, non-slip stool. Keep the floor dry. Clean  up any water that spills on the floor as soon as it happens. Remove soap buildup in the tub or shower regularly. Attach bath mats securely with double-sided non-slip rug tape. Do not have throw rugs and other things on the floor that can make you trip. What can I do in the bedroom? Use night lights. Make sure that you have a light by your bed that is easy to reach. Do not use any sheets or blankets that are too big for your bed. They should not hang down onto the floor. Have a firm chair that has side arms. You can use this for support while you get dressed. Do not have throw rugs and other things on the floor that can make you trip. What can I do in the kitchen? Clean up any spills right away. Avoid walking on wet floors. Keep items that you use a lot in easy-to-reach places. If you need to reach something above you, use a strong step stool that has a grab bar. Keep electrical cords out of the way. Do not use floor polish or wax that makes floors  slippery. If you must use wax, use non-skid floor wax. Do not have throw rugs and other things on the floor that can make you trip. What can I do with my stairs? Do not leave any items on the stairs. Make sure that there are handrails on both sides of the stairs and use them. Fix handrails that are broken or loose. Make sure that handrails are as long as the stairways. Check any carpeting to make sure that it is firmly attached to the stairs. Fix any carpet that is loose or worn. Avoid having throw rugs at the top or bottom of the stairs. If you do have throw rugs, attach them to the floor with carpet tape. Make sure that you have a light switch at the top of the stairs and the bottom of the stairs. If you do not have them, ask someone to add them for you. What else can I do to help prevent falls? Wear shoes that: Do not have high heels. Have rubber bottoms. Are comfortable and fit you well. Are closed at the toe. Do not wear sandals. If you  use a stepladder: Make sure that it is fully opened. Do not climb a closed stepladder. Make sure that both sides of the stepladder are locked into place. Ask someone to hold it for you, if possible. Clearly mark and make sure that you can see: Any grab bars or handrails. First and last steps. Where the edge of each step is. Use tools that help you move around (mobility aids) if they are needed. These include: Canes. Walkers. Scooters. Crutches. Turn on the lights when you go into a dark area. Replace any light bulbs as soon as they burn out. Set up your furniture so you have a clear path. Avoid moving your furniture around. If any of your floors are uneven, fix them. If there are any pets around you, be aware of where they are. Review your medicines with your doctor. Some medicines can make you feel dizzy. This can increase your chance of falling. Ask your doctor what other things that you can do to help prevent falls. This information is not intended to replace advice given to you by your health care provider. Make sure you discuss any questions you have with your health care provider. Document Released: 01/02/2009 Document Revised: 08/14/2015 Document Reviewed: 04/12/2014 Elsevier Interactive Patient Education  2017 [employer].

## 2021-12-22 NOTE — Progress Notes (Addendum)
Virtual Visit via Telephone Note  I connected with  Robert Lang on 12/22/21 at  1:30 PM EDT by telephone and verified that I am speaking with the correct person using two identifiers.  Medicare Annual Wellness visit completed telephonically due to Covid-19 pandemic.   Persons participating in this call: This Health Coach and this patient. And with Robert Lang his wife   Location: Patient: home Provider: office   I discussed the limitations, risks, security and privacy concerns of performing an evaluation and management service by telephone and the availability of in person appointments. The patient expressed understanding and agreed to proceed.  Unable to perform video visit due to video visit attempted and failed and/or patient does not have video capability.   Some vital signs may be absent or patient reported.   Willette Brace, LPN   Subjective:   Robert Lang is a 71 y.o. male who presents for Medicare Annual/Subsequent preventive examination.  Review of Systems     Cardiac Risk Factors include: advanced age (>52mn, >>1women)     Objective:    There were no vitals filed for this visit. There is no height or weight on file to calculate BMI.     12/22/2021    1:43 PM 08/27/2021    3:19 PM 08/26/2021    6:11 PM 05/13/2021   10:13 PM 01/15/2021   12:36 PM 11/25/2020    9:45 AM 01/30/2020   10:32 AM  Advanced Directives  Does Patient Have a Medical Advance Directive? Yes No No Yes;No Yes Yes No  Type of AAcademic librarianLiving will    Living will;Healthcare Power of ABostwickLiving will   Copy of [place] Chart? No - copy requested     No - copy requested     Current Medications (verified) Outpatient Encounter Medications as of 12/22/2021  Medication Sig   acetaminophen (TYLENOL) 325 MG tablet Take 2 tablets (650 mg total) by mouth every 6 (six) hours as needed. (Patient taking  differently: Take 650 mg by mouth every 6 (six) hours as needed for mild pain or moderate pain.)   atorvastatin (LIPITOR) 80 MG tablet Take 1 tablet (80 mg total) by mouth at bedtime.   buPROPion (WELLBUTRIN XL) 300 MG 24 hr tablet Take 1 tablet (300 mg total) by mouth daily.   busPIRone (BUSPAR) 15 MG tablet Take 15 mg by mouth 2 (two) times daily.   Cholecalciferol 25 MCG (1000 UT) tablet Take 1,000 Units by mouth daily.    docusate sodium (COLACE) 100 MG capsule Take 100 mg by mouth daily.   famotidine (PEPCID) 40 MG tablet Take 40 mg by mouth at bedtime.   fexofenadine (ALLEGRA) 180 MG tablet Take 180 mg by mouth at bedtime.    finasteride (PROSCAR) 5 MG tablet Take 1 tablet (5 mg total) by mouth daily.   fluticasone (FLONASE) 50 MCG/ACT nasal spray Place 1 spray into both nostrils daily as needed for allergies or rhinitis.   Ginger, Zingiber officinalis, (GINGER ROOT) 550 MG CAPS Take 550 mg by mouth daily.   melatonin 5 MG TABS Take 10 mg by mouth at bedtime.   Multiple Vitamins-Minerals (PRESERVISION AREDS PO) Take 1 capsule by mouth in the morning and at bedtime.   nitroGLYCERIN (NITROSTAT) 0.4 MG SL tablet Place 1 tablet (0.4 mg total) under the tongue every 5 (five) minutes as needed for chest pain.   NON FORMULARY 1 each by Other route See admin  instructions. CPAP at bedtime   pantoprazole (PROTONIX) 40 MG tablet Take 1 tablet (40 mg total) by mouth daily.   Pirfenidone (ESBRIET) 801 MG TABS Take 801 mg by mouth 3 (three) times daily.   Polyethylene Glycol 3350 (MIRALAX PO) Take 17 g by mouth daily.   pregabalin (LYRICA) 150 MG capsule Take 1 capsule (150 mg total) by mouth 3 (three) times daily.   ranolazine (RANEXA) 1000 MG SR tablet Take 1 tablet (1,000 mg total) by mouth 2 (two) times daily.   rivaroxaban (XARELTO) 20 MG TABS tablet TAKE 1 TABLET(20 MG) BY MOUTH DAILY WITH SUPPER (Patient taking differently: Take 20 mg by mouth daily with supper.)   sertraline (ZOLOFT) 100 MG  tablet Take 100 mg by mouth at bedtime.   traZODone (DESYREL) 50 MG tablet TAKE 1/2 TO 1 TABLET(25 TO 50 MG) BY MOUTH AT BEDTIME AS NEEDED FOR SLEEP (Patient taking differently: Take 25-50 mg by mouth at bedtime as needed for sleep.)   vitamin B-12 (CYANOCOBALAMIN) 1000 MCG tablet Take 1,000 mcg by mouth daily.   ondansetron (ZOFRAN) 4 MG tablet TAKE 1 TABLET(4 MG) BY MOUTH EVERY 8 HOURS AS NEEDED FOR NAUSEA OR VOMITING (Patient not taking: Reported on 12/22/2021)   Tiotropium Bromide-Olodaterol (STIOLTO RESPIMAT) 2.5-2.5 MCG/ACT AERS Inhale 2 puffs into the lungs daily. (Patient not taking: Reported on 12/22/2021)   No facility-administered encounter medications on file as of 12/22/2021.    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 M75.00)   Allergic rhinitis due to pollen 09/03/2013   J30.1)  Formatting of this note might be different from the original. J30.1)   Allergy    Anticoagulated 07/01/2016   Anxiety    Arthritis    Atrial fibrillation ([place])    Atrial flutter ([place]) 06/26/2015   CAD (coronary artery disease)    Chest pain 06/26/2015   COPD (chronic obstructive pulmonary disease) ([place])    Coronary artery disease 07/06/2018   Cardiac catheterization 2017 showing 90% small diagonal branch disease   DDD (degenerative disc disease), cervical 09/03/2013   M50.90)  Formatting of this note might be different from the original. M50.90)   Depression    Depression    Depressive disorder 09/03/2013   Dizziness 10/28/2017   Dyslipidemia, goal LDL below 70 07/06/2018   Dyspnea on exertion 10/20/2018   Esophageal reflux 09/03/2013   Essential hypertension 07/06/2018   ETOH abuse 01/11/2019   6 pack of beer per day   Falls  10/28/2017   GERD (gastroesophageal reflux disease)    H/O amiodarone therapy 07/01/2016   Heart attack ([place])    Heart palpitations 01/27/2017   History of colon polyps    Hyperlipidemia    Hypertension    IPF (idiopathic pulmonary fibrosis) ([place])    Kidney stones    Neck pain 09/03/2013   Neuropathy 07/06/2018   Obstructive sleep apnea 08/05/2015   PLMD (periodic limb movement disorder) 11/04/2015   Polycythemia, 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 STORY: Due to alcohol/ tobacco   Pure hypercholesterolemia 09/03/2013   E78.0)  Formatting of this note might be different from the original. E78.0)   Screening for prostate cancer 09/03/2013   Smoker 01/11/2019  Smoking 07/06/2018   Status post ablation of atrial flutter 07/06/2018   2017   Past Surgical History:  Procedure Laterality Date   ATRIAL FIBRILLATION ABLATION  10/2015   BACK SURGERY     CARDIOVERSION  2017   CATARACT EXTRACTION Bilateral 2017   March and April 2017   COLONOSCOPY  2018   CORONARY STENT PLACEMENT  2012   CORONARY/GRAFT ACUTE MI REVASCULARIZATION N/A 01/10/2019   Procedure: CORONARY/GRAFT ACUTE MI REVASCULARIZATION;  Surgeon: Leonie Man, MD;  Location: [facility];  Service: Cardiovascular;  Laterality: N/A;   INGUINAL HERNIA REPAIR     over 20 years ago   LAPAROSCOPIC APPENDECTOMY N/A 01/17/2021   Procedure: APPENDECTOMY LAPAROSCOPIC;  Surgeon: Felicie Morn, MD;  Location: [facility];  Service: General;  Laterality: N/A;   LEFT HEART CATH AND CORONARY ANGIOGRAPHY N/A 01/10/2019   Procedure: LEFT HEART CATH AND CORONARY ANGIOGRAPHY;  Surgeon: Leonie Man, MD;  Location: [facility];  Service: Cardiovascular;  Laterality: N/A;   LUMBAR LAMINECTOMY Bilateral 04/05/2016   L2-L5    VENTRAL HERNIA REPAIR  2018   Family History  Problem Relation Age of Onset   Colon cancer Father    Throat cancer  Brother    Social History   Socioeconomic History   Marital status: Married    Spouse name: Not on file   Number of children: Not on file   Years of education: Not on file   Highest education level: Not on file  Occupational History   Not on file  Tobacco Use   Smoking status: Former    Packs/day: 1.00    Years: 30.00    Total pack years: 30.00    Types: Cigarettes    Quit date: 01/10/2019    Years since quitting: 2.9   Smokeless tobacco: Former  Scientific laboratory technician Use: Never used  Substance and Sexual Activity   Alcohol use: Yes    Comment: 8oz of wine a day   Drug use: Never   Sexual activity: Not on file  Other Topics Concern   Not on file  Social History Narrative   Not on file   Social Determinants of Health   Financial Resource Strain: Low Risk  (12/22/2021)   Overall Financial Resource Strain (CARDIA)    Difficulty of Paying Living Expenses: Not hard at all  Food Insecurity: No Food Insecurity (12/22/2021)   Hunger Vital Sign    Worried About Running Out of Food in the Last Year: Never true    [place] in the Last Year: Never true  Transportation Needs: No Transportation Needs (12/22/2021)   PRAPARE - Hydrologist (Medical): No    Lack of Transportation (Non-Medical): No  Physical Activity: Inactive (12/22/2021)   Exercise Vital Sign    Days of Exercise per Week: 0 days    Minutes of Exercise per Session: 0 min  Stress: No Stress Concern Present (12/22/2021)   [place]    Feeling of Stress : Not at all  Social Connections: Moderately Integrated (12/22/2021)   Social Connection and Isolation Panel [NHANES]    Frequency of Communication with Friends and Family: More than three times a week    Frequency of Social Gatherings with Friends and Family: More than three times a week    Attends Religious Services: More than 4 times per year    Active Member of [employer]  or Organizations: No    Attends Archivist Meetings: Never    Marital Status: Married    Tobacco Counseling Counseling given: Not Answered   Clinical Intake:  Pre-visit preparation completed: Yes  Pain : No/denies pain     Nutritional Risks: None Diabetes: No  How often do you need to have someone help you when you read instructions, pamphlets, or other written materials from your doctor or pharmacy?: 1 - Never  Diabetic?no  Interpreter Needed?: No  Information entered by :: Charlott Rakes, LPN   Activities of Daily Living    12/22/2021    1:45 PM 08/28/2021    3:18 PM  In your present state of health, do you have any difficulty performing the following activities:  Hearing? 0   Vision? 0   Difficulty concentrating or making decisions? 0   Walking or climbing stairs? 1   Comment walk careful and hold on to rail   Dressing or bathing? 0   Doing errands, shopping? 0 0  Preparing Food and eating ? N   Using the Toilet? N   In the past six months, have you accidently leaked urine? N   Do you have problems with loss of bowel control? N   Managing your Medications? N   Managing your Finances? N   Housekeeping or managing your Housekeeping? N     Patient Care Team: [name], Iris Pert as PCP - General (Internal Medicine) Park Liter, MD as PCP - Cardiology (Cardiology)  Indicate any recent Medical Services you may have received from other than [facility] providers in the past year (date may be approximate).     Assessment:   This is a routine wellness examination for Brittian.  Hearing/Vision screen Hearing Screening - Comments:: Pt denies any hearing issues  Vision Screening - Comments:: Pt follows up with [facility]   Dietary issues and exercise activities discussed: Current Exercise Habits: The patient does not participate in regular exercise at present   Goals Addressed             This Visit's Progress    Patient Stated        Get out to walk more        Depression Screen    12/22/2021    1:42 PM 11/25/2020    9:47 AM 10/13/2020    9:18 AM  PHQ 2/9 Scores  PHQ - 2 Score 0 1 6  PHQ- 9 Score   17    Fall Risk    12/22/2021    1:45 PM 11/25/2020    9:46 AM 10/13/2020    8:17 AM  Fall Risk   Falls in the past year? '1 1 1  '$ Number falls in past yr: '1 1 1  '$ Injury with Fall? 0 0 0  Risk for fall due to : Impaired vision;Impaired mobility;Impaired balance/gait;History of fall(s) History of fall(s);Impaired mobility   Follow up Falls prevention discussed Falls prevention discussed     FALL RISK PREVENTION PERTAINING TO THE HOME:  Any stairs in or around the home? Yes  If so, are there any without handrails? No  Home free of loose throw rugs in walkways, pet beds, electrical cords, etc? Yes  Adequate lighting in your home to reduce risk of falls? Yes   ASSISTIVE DEVICES UTILIZED TO PREVENT FALLS:  Life alert? No  Use of a cane, walker or w/c? Yes  Grab bars in the bathroom? No  Shower chair or bench in shower? Yes  Elevated toilet seat or a handicapped toilet? No   TIMED UP AND GO:  Was the test performed? No .  Cognitive Function:        12/22/2021    1:47 PM 11/25/2020    9:57 AM  6CIT Screen  What Year? 0 points 0 points  What month? 0 points 0 points  What time? 0 points 0 points  Count back from 20 0 points 0 points  Months in reverse 0 points 2 points  Repeat phrase 0 points 2 points  Total Score 0 points 4 points    Immunizations Immunization History  Administered Date(s) Administered   Influenza Split 01/09/2010, 02/09/2011   Influenza, High Dose Seasonal PF 11/15/2018, 01/11/2020, 12/27/2020   Influenza,inj,Quad PF,6+ Mos 01/01/2015   Influenza-Unspecified 01/30/2014, 02/02/2016, 12/15/2020   PFIZER(Purple Top)SARS-COV-2 Vaccination 04/27/2019, 05/18/2019, 01/14/2020, 07/22/2020, 12/15/2020   Pneumococcal Conjugate-13 11/23/2018   Pneumococcal Polysaccharide-23 08/12/2020    Tdap 02/18/2011    TDAP status: Due, Education has been provided regarding the importance of this vaccine. Advised may receive this vaccine at local pharmacy or Health Dept. Aware to provide a copy of the vaccination record if obtained from local pharmacy or Health Dept. Verbalized acceptance and understanding.  Flu Vaccine status: Up to date  Pneumococcal vaccine status: Up to date  Covid-19 vaccine status: Completed vaccines  Qualifies for Shingles Vaccine? Yes   Zostavax completed No   Shingrix Completed?: No.    Education has been provided regarding the importance of this vaccine. Patient has been advised to call insurance company to determine out of pocket expense if they have not yet received this vaccine. Advised may also receive vaccine at local pharmacy or Health Dept. Verbalized acceptance and understanding.  Screening Tests Health Maintenance  Topic Date Due   Hepatitis C Screening  Never done   Zoster Vaccines- Shingrix (1 of 2) Never done   COVID-19 Vaccine (6 - Pfizer risk series) 02/09/2021   TETANUS/TDAP  02/17/2021   INFLUENZA VACCINE  10/20/2021   COLONOSCOPY (Pts 45-75yr Insurance coverage will need to be confirmed)  07/10/2031   Pneumonia Vaccine 71 Years old  Completed   HPV VACCINES  Aged Out    Health Maintenance  Health Maintenance Due  Topic Date Due   Hepatitis C Screening  Never done   Zoster Vaccines- Shingrix (1 of 2) Never done   COVID-19 Vaccine (6 - Pfizer risk series) 02/09/2021   TETANUS/TDAP  02/17/2021   INFLUENZA VACCINE  10/20/2021    Colorectal cancer screening: Type of screening: Colonoscopy. Completed 07/09/21. Repeat every 10 years   Additional Screening:  Hepatitis C Screening: does qualify;  Vision Screening: Recommended annual ophthalmology exams for early detection of glaucoma and other disorders of the eye. Is the patient up to date with their annual eye exam?  Yes  Who is the provider or what is the name of the office in  which the patient attends annual eye exams? [facility]  If pt is not established with a provider, would they like to be referred to a provider to establish care? No .   Dental Screening: Recommended annual dental exams for proper oral hygiene  Community Resource Referral / Chronic Care Management: CRR required this visit?  No   CCM required this visit?  No      Plan:     I have personally reviewed and noted the following in the patient's chart:   Medical and social history Use of alcohol, tobacco or illicit drugs  Current medications and supplements including opioid prescriptions. Patient is not currently taking opioid prescriptions. Functional ability and status Nutritional status Physical activity Advanced directives List of other physicians Hospitalizations, surgeries, and ER visits in previous 12 months Vitals Screenings to include cognitive, depression, and falls Referrals and appointments  In addition, I have reviewed and discussed with patient certain preventive protocols, quality metrics, and best practice recommendations. A written personalized care plan for preventive services as well as general preventive health recommendations were provided to patient.     Willette Brace, LPN   [id number]   Nurse Notes: none    Review and Agree with assessment & plan of LPN   Mackie Pai, [name]

## 2021-12-22 NOTE — Telephone Encounter (Signed)
Results reviewed with pt as per Dr. Krasowski's note.  Pt verbalized understanding and had no additional questions. Routed to PCP  

## 2021-12-28 ENCOUNTER — Other Ambulatory Visit: Payer: Self-pay | Admitting: Medical

## 2021-12-29 ENCOUNTER — Ambulatory Visit (INDEPENDENT_AMBULATORY_CARE_PROVIDER_SITE_OTHER): Payer: Medicare Other | Admitting: Medical

## 2021-12-29 VITALS — BP 118/64 | HR 87 | Resp 18 | Ht 71.0 in | Wt 203.6 lb

## 2021-12-29 DIAGNOSIS — R739 Hyperglycemia, unspecified: Secondary | ICD-10-CM | POA: Diagnosis not present

## 2021-12-29 DIAGNOSIS — R030 Elevated blood-pressure reading, without diagnosis of hypertension: Secondary | ICD-10-CM

## 2021-12-29 DIAGNOSIS — F3289 Other specified depressive episodes: Secondary | ICD-10-CM | POA: Diagnosis not present

## 2021-12-29 DIAGNOSIS — R635 Abnormal weight gain: Secondary | ICD-10-CM

## 2021-12-29 DIAGNOSIS — J84112 Idiopathic pulmonary fibrosis: Secondary | ICD-10-CM | POA: Diagnosis not present

## 2021-12-29 DIAGNOSIS — J432 Centrilobular emphysema: Secondary | ICD-10-CM

## 2021-12-29 DIAGNOSIS — F419 Anxiety disorder, unspecified: Secondary | ICD-10-CM

## 2021-12-29 DIAGNOSIS — E785 Hyperlipidemia, unspecified: Secondary | ICD-10-CM

## 2021-12-29 DIAGNOSIS — Z1159 Encounter for screening for other viral diseases: Secondary | ICD-10-CM

## 2021-12-29 NOTE — Addendum Note (Signed)
Addended by: Anabel Halon on: 12/29/2021 01:50 PM   Modules accepted: Orders, Level of Service

## 2021-12-29 NOTE — Progress Notes (Addendum)
Subjective:    Patient ID: Robert Lang, male    DOB: 11/20/1950, 71 y.o.   MRN: [id number]  HPI  Pt in follow up  Overall pt stated he feels well. But does not his bp 160/90. He felt tired at that time. He states he was upset about something that may have upset him.  Later in day bp was 128/74. 2 days ago 154/79. Pt checks his blood pressure very frequently. Most of time bp is well controlled. Occasional high systolic and  random high diastolic  Pulmonoary fibrosis. He most of time his 02 % is 93%. Occasionally will be be as low as 87%. Pt has pulmonary function test next Thursday. Then will see specialist.   Pt has put on weight recently. He has been eating a lot. Wife associates this with using more lyrica. Wife states he is eating a lot. Describes gaining around belt.  Elevated sugar in past. Will get a1c.  Pt has some depression in past. Also had some anxiety. Pt got upset with his pyshciatrist office. He had virtual appt and sat their for 45 minutes. He is on wellbutrin and buspar. He wants be referred to local psychiatrist.      Review of Systems  Constitutional:  Negative for chills, fatigue and fever.  HENT:  Negative for congestion and ear discharge.   Respiratory:  Negative for cough, chest tightness, shortness of breath and wheezing.   Cardiovascular:  Negative for chest pain and palpitations.  Gastrointestinal:  Negative for abdominal pain and blood in stool.  Genitourinary:  Negative for dysuria and frequency.  Musculoskeletal:  Negative for back pain, myalgias and neck stiffness.  Skin:  Negative for rash.  Neurological:  Negative for dizziness and light-headedness.  Hematological:  Negative for adenopathy. Does not bruise/bleed easily.  Psychiatric/Behavioral:  Negative for behavioral problems and confusion.     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 M75.00)   Allergic rhinitis due to pollen 09/03/2013   J30.1)  Formatting of this note might be different from the original. J30.1)   Allergy    Anticoagulated 07/01/2016   Anxiety    Arthritis    Atrial fibrillation ([place])    Atrial flutter ([place]) 06/26/2015   CAD (coronary artery disease)    Chest pain 06/26/2015   COPD (chronic obstructive pulmonary disease) ([place])    Coronary artery disease 07/06/2018   Cardiac catheterization 2017 showing 90% small diagonal branch disease   DDD (degenerative disc disease), cervical 09/03/2013   M50.90)  Formatting of this note might be different from the original. M50.90)   Depression    Depression    Depressive disorder 09/03/2013   Dizziness 10/28/2017   Dyslipidemia, goal LDL below 70 07/06/2018   Dyspnea on exertion 10/20/2018   Esophageal reflux 09/03/2013   Essential hypertension 07/06/2018   ETOH abuse 01/11/2019   6 pack of beer per day   Falls 10/28/2017   GERD (gastroesophageal reflux disease)    H/O amiodarone therapy 07/01/2016   Heart attack ([place])    Heart palpitations 01/27/2017   History of colon polyps    Hyperlipidemia    Hypertension    IPF (idiopathic pulmonary fibrosis) ([place])    Kidney stones    Neck pain 09/03/2013   Neuropathy 07/06/2018   Obstructive sleep apnea 08/05/2015  PLMD (periodic limb movement disorder) 11/04/2015   Polycythemia, 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 STORY: Due to alcohol/ tobacco   Pure hypercholesterolemia 09/03/2013   E78.0)  Formatting of this note might be different from the original. E78.0)   Screening for prostate cancer 09/03/2013   Smoker 01/11/2019   Smoking 07/06/2018   Status post ablation of atrial flutter 07/06/2018   2017     Social History   Socioeconomic History   Marital  status: Married    Spouse name: Not on file   Number of children: Not on file   Years of education: Not on file   Highest education level: Not on file  Occupational History   Not on file  Tobacco Use   Smoking status: Former    Packs/day: 1.00    Years: 30.00    Total pack years: 30.00    Types: Cigarettes    Quit date: 01/10/2019    Years since quitting: 2.9   Smokeless tobacco: Former  Scientific laboratory technician Use: Never used  Substance and Sexual Activity   Alcohol use: Yes    Comment: 8oz of wine a day   Drug use: Never   Sexual activity: Not on file  Other Topics Concern   Not on file  Social History Narrative   Not on file   Social Determinants of Health   Financial Resource Strain: Low Risk  (12/22/2021)   Overall Financial Resource Strain (CARDIA)    Difficulty of Paying Living Expenses: Not hard at all  Food Insecurity: No Food Insecurity (12/22/2021)   Hunger Vital Sign    Worried About Running Out of Food in the Last Year: Never true    Ran Out of Food in the Last Year: Never true  Transportation Needs: No Transportation Needs (12/22/2021)   PRAPARE - Hydrologist (Medical): No    Lack of Transportation (Non-Medical): No  Physical Activity: Inactive (12/22/2021)   Exercise Vital Sign    Days of Exercise per Week: 0 days    Minutes of Exercise per Session: 0 min  Stress: No Stress Concern Present (12/22/2021)   [place]    Feeling of Stress : Not at all  Social Connections: Moderately Integrated (12/22/2021)   Social Connection and Isolation Panel [NHANES]    Frequency of Communication with Friends and Family: More than three times a week    Frequency of Social Gatherings with Friends and Family: More than three times a week    Attends Religious Services: More than 4 times per year    Active Member of [employer] or Organizations: No    Attends Archivist Meetings:  Never    Marital Status: Married  Human resources officer Violence: Not At Risk (12/22/2021)   Humiliation, Afraid, Rape, and Kick questionnaire    Fear of Current or Ex-Partner: No    Emotionally Abused: No    Physically Abused: No    Sexually Abused: No    Past Surgical History:  Procedure Laterality Date   ATRIAL FIBRILLATION ABLATION  10/2015   BACK SURGERY     CARDIOVERSION  2017   CATARACT EXTRACTION Bilateral 2017   March and April 2017   COLONOSCOPY  2018   CORONARY STENT PLACEMENT  2012   CORONARY/GRAFT ACUTE MI REVASCULARIZATION N/A 01/10/2019   Procedure: CORONARY/GRAFT ACUTE MI REVASCULARIZATION;  Surgeon:  Leonie Man, MD;  Location: [facility];  Service: Cardiovascular;  Laterality: N/A;   INGUINAL HERNIA REPAIR     over 20 years ago   LAPAROSCOPIC APPENDECTOMY N/A 01/17/2021   Procedure: APPENDECTOMY LAPAROSCOPIC;  Surgeon: Felicie Morn, MD;  Location: [facility];  Service: General;  Laterality: N/A;   LEFT HEART CATH AND CORONARY ANGIOGRAPHY N/A 01/10/2019   Procedure: LEFT HEART CATH AND CORONARY ANGIOGRAPHY;  Surgeon: Leonie Man, MD;  Location: [facility];  Service: Cardiovascular;  Laterality: N/A;   LUMBAR LAMINECTOMY Bilateral 04/05/2016   L2-L5    VENTRAL HERNIA REPAIR  2018    Family History  Problem Relation Age of Onset   Colon cancer Father    Throat cancer Brother     Allergies  Allergen Reactions   Naproxen Other (See Comments)    (Naprosyn *ANALGESICS - ANTI-INFLAMMATORY*) Nausea, Abdominal pain   Silodosin Other (See Comments)    Low blood pressure    Current Outpatient Medications on File Prior to Visit  Medication Sig Dispense Refill   acetaminophen (TYLENOL) 325 MG tablet Take 2 tablets (650 mg total) by mouth every 6 (six) hours as needed. (Patient taking differently: Take 650 mg by mouth every 6 (six) hours as needed for mild pain or moderate pain.)     atorvastatin (LIPITOR) 80 MG tablet Take 1 tablet (80 mg  total) by mouth at bedtime. 90 tablet 3   buPROPion (WELLBUTRIN XL) 300 MG 24 hr tablet Take 1 tablet (300 mg total) by mouth daily. 90 tablet 0   busPIRone (BUSPAR) 15 MG tablet Take 15 mg by mouth 2 (two) times daily.     Cholecalciferol 25 MCG (1000 UT) tablet Take 1,000 Units by mouth daily.      docusate sodium (COLACE) 100 MG capsule Take 100 mg by mouth daily.     famotidine (PEPCID) 40 MG tablet Take 40 mg by mouth at bedtime.     fexofenadine (ALLEGRA) 180 MG tablet Take 180 mg by mouth at bedtime.      finasteride (PROSCAR) 5 MG tablet Take 1 tablet (5 mg total) by mouth daily. 90 tablet 1   fluticasone (FLONASE) 50 MCG/ACT nasal spray Place 1 spray into both nostrils daily as needed for allergies or rhinitis.     Ginger, Zingiber officinalis, (GINGER ROOT) 550 MG CAPS Take 550 mg by mouth daily.     melatonin 5 MG TABS Take 10 mg by mouth at bedtime.     Multiple Vitamins-Minerals (PRESERVISION AREDS PO) Take 1 capsule by mouth in the morning and at bedtime.     nitroGLYCERIN (NITROSTAT) 0.4 MG SL tablet Place 1 tablet (0.4 mg total) under the tongue every 5 (five) minutes as needed for chest pain. 25 tablet 3   NON FORMULARY 1 each by Other route See admin instructions. CPAP at bedtime     ondansetron (ZOFRAN) 4 MG tablet TAKE 1 TABLET(4 MG) BY MOUTH EVERY 8 HOURS AS NEEDED FOR NAUSEA OR VOMITING 20 tablet 0   pantoprazole (PROTONIX) 40 MG tablet Take 1 tablet (40 mg total) by mouth daily. 90 tablet 3   Pirfenidone (ESBRIET) 801 MG TABS Take 801 mg by mouth 3 (three) times daily. 270 tablet 1   Polyethylene Glycol 3350 (MIRALAX PO) Take 17 g by mouth daily.     pregabalin (LYRICA) 150 MG capsule Take 1 capsule (150 mg total) by mouth 3 (three) times daily. 90 capsule 5   ranolazine (RANEXA) 1000  MG SR tablet Take 1 tablet (1,000 mg total) by mouth 2 (two) times daily. 180 tablet 2   rivaroxaban (XARELTO) 20 MG TABS tablet TAKE 1 TABLET(20 MG) BY MOUTH DAILY WITH SUPPER (Patient taking  differently: Take 20 mg by mouth daily with supper.) 30 tablet 5   sertraline (ZOLOFT) 100 MG tablet Take 100 mg by mouth at bedtime.     Tiotropium Bromide-Olodaterol (STIOLTO RESPIMAT) 2.5-2.5 MCG/ACT AERS Inhale 2 puffs into the lungs daily. 4 g 5   traZODone (DESYREL) 50 MG tablet TAKE 1/2 TO 1 TABLET(25 TO 50 MG) BY MOUTH AT BEDTIME AS NEEDED FOR SLEEP 30 tablet 3   vitamin B-12 (CYANOCOBALAMIN) 1000 MCG tablet Take 1,000 mcg by mouth daily.     No current facility-administered medications on file prior to visit.    BP 118/64   Pulse 87   Resp 18   Ht '5\' 11"'$  (1.803 m)   Wt 203 lb 9.6 oz (92.4 kg)   SpO2 92%   BMI 28.40 kg/m        Objective:   Physical Exam  General Mental Status- Alert. General Appearance- Not in acute distress.   Skin General: Color- Normal Color. Moisture- Normal Moisture.  Neck Carotid Arteries- Normal color. Moisture- Normal Moisture. No carotid bruits. No JVD.  Chest and Lung Exam Auscultation: Breath Sounds:-Normal.  Cardiovascular Auscultation:Rythm- Regular. Murmurs & Other Heart Sounds:Auscultation of the heart reveals- No Murmurs.  Abdomen Inspection:-Inspeection Normal. Palpation/Percussion:Note:No mass. Palpation and Percussion of the abdomen reveal- Non Tender, Non Distended + BS, no rebound or guarding.   Neurologic Cranial Nerve exam:- CN III-XII intact(No nystagmus), symmetric smile. Strength:- 5/5 equal and symmetric strength both upper and lower extremities.        Assessment & Plan:   Patient Instructions  For IPF continue with pulmonologist and do the PFT. Continue current inhalers.  For elevated sugar placed future cmp and a1c today.  For anxiety and depression continue current wellbutrin and buspar.  Gave you list of local psychiatrist. Please call and find out who accepts medicare. If you give me name and number of office will place the referral.   For high cholesterol placing future lipid panel.  Your weight  gain does appear to coincide with starting the lyrica. Please keep your weight stable. Avoid gaining more as this may in long run effect endurance and could results in diabetes.  Follow up date to be determined after lab review.   Mackie Pai, [name]   Time spent with patient today was  40 minutes which consisted of chart review, discussing diagnosis, work up treatment and documentation.

## 2021-12-29 NOTE — Patient Instructions (Addendum)
For IPF continue with pulmonologist and do the PFT. Continue current inhalers.  For elevated sugar placed future cmp and a1c today.  For anxiety and depression continue current wellbutrin and buspar.  Gave you list of local psychiatrist. Please call and find out who accepts medicare. If you give me name and number of office will place the referral.   For high cholesterol placing future lipid panel.  Your weight gain does appear to coincide with starting the lyrica. Please keep your weight stable. Avoid gaining more as this may in long run effect endurance and could results in diabetes.  Your blood pressure is good today. I want you to check bp just once daily. If your bp is over 140/90 then wait 10 minutes and recheck. If on recheck bp level over 150/90.   For neuropathy continue lyrica. On review of mri finding correlate with l4-l5 nerve compression.  Follow up date to be determined after lab review.

## 2021-12-30 ENCOUNTER — Other Ambulatory Visit (INDEPENDENT_AMBULATORY_CARE_PROVIDER_SITE_OTHER): Payer: Medicare Other

## 2021-12-30 DIAGNOSIS — R739 Hyperglycemia, unspecified: Secondary | ICD-10-CM

## 2021-12-30 DIAGNOSIS — E785 Hyperlipidemia, unspecified: Secondary | ICD-10-CM

## 2021-12-30 DIAGNOSIS — Z1159 Encounter for screening for other viral diseases: Secondary | ICD-10-CM | POA: Diagnosis not present

## 2021-12-30 LAB — COMPREHENSIVE METABOLIC PANEL
ALT: 19 U/L (ref 0–53)
AST: 15 U/L (ref 0–37)
Albumin: 3.9 g/dL (ref 3.5–5.2)
Alkaline Phosphatase: 81 U/L (ref 39–117)
BUN: 13 mg/dL (ref 6–23)
CO2: 28 mEq/L (ref 19–32)
Calcium: 9.2 mg/dL (ref 8.4–10.5)
Chloride: 104 mEq/L (ref 96–112)
Creatinine, Ser: 0.84 mg/dL (ref 0.40–1.50)
GFR: 87.58 mL/min (ref >60.00)
Glucose, Bld: 81 mg/dL (ref 70–99)
Potassium: 4.2 mEq/L (ref 3.5–5.1)
Sodium: 138 mEq/L (ref 135–145)
Total Bilirubin: 0.5 mg/dL (ref 0.2–1.2)
Total Protein: 7.1 g/dL (ref 6.0–8.3)

## 2021-12-30 LAB — HEMOGLOBIN A1C: Hgb A1c MFr Bld: 5.9 % (ref 4.6–6.5)

## 2021-12-30 LAB — LIPID PANEL
Cholesterol: 144 mg/dL (ref 0–200)
HDL: 25 mg/dL — ABNORMAL LOW (ref >39.00)
NonHDL: 119.45
Total CHOL/HDL Ratio: 6
Triglycerides: 342 mg/dL — ABNORMAL HIGH (ref 0.0–149.0)
VLDL: 68.4 mg/dL — ABNORMAL HIGH (ref 0.0–40.0)

## 2021-12-30 LAB — LDL CHOLESTEROL, DIRECT: Direct LDL: 80 mg/dL

## 2021-12-31 LAB — HEPATITIS C ANTIBODY: Hepatitis C Ab: NONREACTIVE

## 2022-01-07 ENCOUNTER — Ambulatory Visit: Payer: Medicare Other | Admitting: Internal Medicine

## 2022-01-07 ENCOUNTER — Ambulatory Visit (INDEPENDENT_AMBULATORY_CARE_PROVIDER_SITE_OTHER): Payer: Medicare Other | Admitting: Internal Medicine

## 2022-01-07 ENCOUNTER — Encounter: Payer: Self-pay | Admitting: Internal Medicine

## 2022-01-07 ENCOUNTER — Telehealth: Payer: Self-pay | Admitting: Internal Medicine

## 2022-01-07 VITALS — BP 112/70 | HR 91 | Ht 71.0 in | Wt 206.0 lb

## 2022-01-07 DIAGNOSIS — R635 Abnormal weight gain: Secondary | ICD-10-CM

## 2022-01-07 DIAGNOSIS — R0609 Other forms of dyspnea: Secondary | ICD-10-CM

## 2022-01-07 DIAGNOSIS — J84112 Idiopathic pulmonary fibrosis: Secondary | ICD-10-CM | POA: Diagnosis not present

## 2022-01-07 DIAGNOSIS — Z5181 Encounter for therapeutic drug level monitoring: Secondary | ICD-10-CM

## 2022-01-07 LAB — PULMONARY FUNCTION TEST
DL/VA % pred: 74 %
DL/VA: 3 ml/min/mmHg/L
DLCO cor % pred: 62 %
DLCO cor: 16.62 ml/min/mmHg
DLCO unc % pred: 62 %
DLCO unc: 16.62 ml/min/mmHg
FEF 25-75 Pre: 1.41 L/sec
FEF2575-%Pred-Pre: 56 %
FEV1-%Pred-Pre: 66 %
FEV1-Pre: 2.21 L
FEV1FVC-%Pred-Pre: 98 %
FEV6-%Pred-Pre: 69 %
FEV6-Pre: 2.97 L
FEV6FVC-%Pred-Pre: 103 %
FVC-%Pred-Pre: 66 %
FVC-Pre: 3.05 L
Pre FEV1/FVC ratio: 73 %
Pre FEV6/FVC Ratio: 97 %

## 2022-01-07 NOTE — Patient Instructions (Addendum)
IPF (idiopathic pulmonary fibrosis) (HCC) Medication monitoring encounter DOE (dyspnea on exertion) Unintended weight gain   -18 pound weight gain since starting Lyrica and more likely the reason for worsening shortness of breath -Pulmonary fibrosis could be worse based on breathing test -However between the 2 I suspect weight gain is the main reason for worsening shortness of breath -Liver function test normal in October 2023   plan - check LFT in 3 months -Recheck spirometry and DLCO in 3 months - continue esbriet per schedule -Consider clinical research in the future but at this moment address weight gain due to Lyrica -Have sent a message to her neurologist about the weight gain -Also talk to primary care physician about medication for weight loss   Follow-up - 3 months Dr Chase Caller but after PFT; symptom score and walk at followup  -30-minute visit

## 2022-01-07 NOTE — Patient Instructions (Signed)
Spirometry/DLCO performed today. 

## 2022-01-07 NOTE — Progress Notes (Signed)
Spirometry/DLCO performed today. 

## 2022-01-07 NOTE — Telephone Encounter (Signed)
Dear Dr. Chase Caller,   Yes, in fact, Lyrica is associated with weight gain. Since this is affecting his pulmonary status, I will discontinue the mediation and switch him to a different agent.   Thank you  Dr. April Manson

## 2022-01-07 NOTE — Telephone Encounter (Signed)
Dear Dr Robert Lang Has 18 pound weight gain since starting Lyrica.  He and his wife feel that the weight gain has happened abruptly since starting Lyrica.  They feel Lyrica is the reason for the weight gain.  He is also got extreme amount of craving and is eating a lot like he has never eaten before.  This the heaviest he has weight.  Regardless of the cause the weight gain is affecting his dyspnea and his pulmonary function testing  Plan - If Lyrica is the cause of the weight gain could you please make a change.  Definitely please address this concern with them thank you   Happy to answer any questions you might have   SIGNATURE    Robert Lang, M.D., F.C.C.P,  Pulmonary and Critical Care Medicine Staff Physician, [place] Director - Interstitial Lung Disease  Program  Medical Director - [facility] at [place], [place], [address]   Pager: [phone], If no answer  -[place] or [place] [phone] Telephone (clinical office): [phone] Telephone (research): [phone]  10:11 AM 01/07/2022

## 2022-01-07 NOTE — Telephone Encounter (Signed)
Thank you so much. Will close message. Nothing futhte rneeded

## 2022-01-07 NOTE — Progress Notes (Signed)
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 rcorded Modrate dyspnea Moderate to severe dyspnea  Miscellaneous comments x     CT Chest data  No results found.    PFT     Latest Ref Rng & Units 01/07/2022    8:30 AM 06/23/2021    9:23 AM 02/19/2021   11:47 AM 11/18/2020    9:04 AM 05/09/2020    8:46 AM  PFT Results  FVC-Pre L 3.05  P 3.48  3.49  3.33  3.21   FVC-Predicted Pre % 66  P 76  75  72  69   FVC-Post L     3.38   FVC-Predicted Post %     73   Pre FEV1/FVC % % 73  P 76  73  79  81   Post FEV1/FCV % %     76   FEV1-Pre L 2.21  P 2.64  2.56  2.63  2.60   FEV1-Predicted Pre % 66  P 79  75  77  76   FEV1-Post L     2.55   DLCO uncorrected ml/min/mmHg 16.62  P 17.94  20.29  19.95  22.07   DLCO UNC% %  62  P 67  75  74  82  DLCO corrected ml/min/mmHg 16.62  P 17.94  20.29  19.95  21.66   DLCO COR %Predicted % 62  P 67  75  74  81   DLVA Predicted % 74  P 79  81  79  100   TLC L     7.61   TLC % Predicted %     105   RV % Predicted %     139     P Preliminary result       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), Allergic rhinitis due to pollen (09/03/2013), Allergy, Anticoagulated (07/01/2016), Anxiety, Arthritis, Atrial fibrillation ([place]), Atrial flutter ([place]) (06/26/2015), CAD (coronary artery disease), Chest pain (06/26/2015), COPD (chronic obstructive pulmonary disease) ([place]), Coronary artery disease (07/06/2018), DDD (degenerative disc disease), cervical (09/03/2013), Depression, Depression, Depressive disorder (09/03/2013), Dizziness (10/28/2017), Dyslipidemia, goal LDL below 70 (07/06/2018), Dyspnea on exertion (10/20/2018), Esophageal reflux (09/03/2013), Essential hypertension (07/06/2018), ETOH abuse (01/11/2019), Falls (10/28/2017), GERD (gastroesophageal reflux disease), H/O amiodarone therapy (07/01/2016), Heart attack ([place]), Heart palpitations (01/27/2017), History of colon polyps, Hyperlipidemia, Hypertension, IPF (idiopathic pulmonary fibrosis) ([place]), Kidney stones, Neck pain (09/03/2013), Neuropathy (07/06/2018), Obstructive sleep apnea (08/05/2015), PLMD (periodic limb movement disorder) (11/04/2015), Polycythemia, 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   reports that he quit smoking about 2 years ago. His smoking use included cigarettes. He has a 30.00 pack-year smoking history. He has quit using smokeless tobacco.  Past Surgical History:  Procedure Laterality Date   ATRIAL FIBRILLATION  ABLATION  10/2015   BACK SURGERY     CARDIOVERSION  2017   CATARACT EXTRACTION Bilateral 2017   March and April 2017   COLONOSCOPY  2018   CORONARY STENT PLACEMENT  2012   CORONARY/GRAFT ACUTE MI REVASCULARIZATION N/A 01/10/2019   Procedure: CORONARY/GRAFT ACUTE MI REVASCULARIZATION;  Surgeon: Leonie Man, MD;  Location: [facility];  Service: Cardiovascular;  Laterality: N/A;   INGUINAL HERNIA REPAIR     over 20 years ago   LAPAROSCOPIC APPENDECTOMY N/A 01/17/2021   Procedure: APPENDECTOMY LAPAROSCOPIC;  Surgeon: Felicie Morn, MD;  Location: [facility];  Service: General;  Laterality: N/A;   LEFT HEART CATH AND CORONARY ANGIOGRAPHY N/A 01/10/2019   Procedure: LEFT HEART CATH AND CORONARY ANGIOGRAPHY;  Surgeon: Leonie Man, MD;  Location: [facility];  Service: Cardiovascular;  Laterality: N/A;   LUMBAR LAMINECTOMY Bilateral 04/05/2016   L2-L5    VENTRAL HERNIA REPAIR  2018    Allergies  Allergen Reactions   Naproxen Other (See Comments)    (Naprosyn *ANALGESICS - ANTI-INFLAMMATORY*) Nausea, Abdominal pain   Silodosin Other (See Comments)    Low blood pressure    Immunization History  Administered Date(s) Administered   Influenza Split 01/09/2010, 02/09/2011   Influenza, High Dose Seasonal PF 11/15/2018, 01/11/2020, 12/27/2020, 12/24/2021   Influenza,inj,Quad PF,6+ Mos 01/01/2015   Influenza-Unspecified 01/30/2014, 02/02/2016, 12/15/2020   PFIZER(Purple Top)SARS-COV-2 Vaccination 04/27/2019, 05/18/2019, 01/14/2020, 07/22/2020, 12/15/2020, 12/24/2021   Pneumococcal Conjugate-13 11/23/2018   Pneumococcal Polysaccharide-23 08/12/2020   Respiratory Syncytial Virus Vaccine,Recomb Aduvanted(Arexvy) 12/17/2021   Tdap 02/18/2011    Family History  Problem Relation Age of Onset   Colon cancer Father    Throat cancer Brother      Current Outpatient Medications:    acetaminophen (TYLENOL) 325 MG tablet, Take 2 tablets (650 mg  total) by mouth every 6  (six) hours as needed. (Patient taking differently: Take 650 mg by mouth every 6 (six) hours as needed for mild pain or moderate pain.), Disp: , Rfl:    atorvastatin (LIPITOR) 80 MG tablet, Take 1 tablet (80 mg total) by mouth at bedtime., Disp: 90 tablet, Rfl: 3   buPROPion (WELLBUTRIN XL) 300 MG 24 hr tablet, Take 1 tablet (300 mg total) by mouth daily., Disp: 90 tablet, Rfl: 0   busPIRone (BUSPAR) 15 MG tablet, Take 15 mg by mouth 2 (two) times daily., Disp: , Rfl:    Cholecalciferol 25 MCG (1000 UT) tablet, Take 1,000 Units by mouth daily. , Disp: , Rfl:    docusate sodium (COLACE) 100 MG capsule, Take 100 mg by mouth daily., Disp: , Rfl:    famotidine (PEPCID) 40 MG tablet, Take 40 mg by mouth at bedtime., Disp: , Rfl:    fexofenadine (ALLEGRA) 180 MG tablet, Take 180 mg by mouth at bedtime. , Disp: , Rfl:    finasteride (PROSCAR) 5 MG tablet, Take 1 tablet (5 mg total) by mouth daily., Disp: 90 tablet, Rfl: 1   fluticasone (FLONASE) 50 MCG/ACT nasal spray, Place 1 spray into both nostrils daily as needed for allergies or rhinitis., Disp: , Rfl:    Ginger, Zingiber officinalis, (GINGER ROOT) 550 MG CAPS, Take 550 mg by mouth daily., Disp: , Rfl:    melatonin 5 MG TABS, Take 10 mg by mouth at bedtime., Disp: , Rfl:    Multiple Vitamins-Minerals (PRESERVISION AREDS PO), Take 1 capsule by mouth in the morning and at bedtime., Disp: , Rfl:    nitroGLYCERIN (NITROSTAT) 0.4 MG SL tablet, Place 1 tablet (0.4 mg total) under the tongue every 5 (five) minutes as needed for chest pain., Disp: 25 tablet, Rfl: 3   NON FORMULARY, 1 each by Other route See admin instructions. CPAP at bedtime, Disp: , Rfl:    ondansetron (ZOFRAN) 4 MG tablet, TAKE 1 TABLET(4 MG) BY MOUTH EVERY 8 HOURS AS NEEDED FOR NAUSEA OR VOMITING, Disp: 20 tablet, Rfl: 0   pantoprazole (PROTONIX) 40 MG tablet, Take 1 tablet (40 mg total) by mouth daily., Disp: 90 tablet, Rfl: 3   Pirfenidone (ESBRIET) 801 MG TABS, Take 801 mg by mouth 3  (three) times daily., Disp: 270 tablet, Rfl: 1   Polyethylene Glycol 3350 (MIRALAX PO), Take 17 g by mouth daily., Disp: , Rfl:    pregabalin (LYRICA) 150 MG capsule, Take 1 capsule (150 mg total) by mouth 3 (three) times daily., Disp: 90 capsule, Rfl: 5   ranolazine (RANEXA) 1000 MG SR tablet, Take 1 tablet (1,000 mg total) by mouth 2 (two) times daily., Disp: 180 tablet, Rfl: 2   rivaroxaban (XARELTO) 20 MG TABS tablet, TAKE 1 TABLET(20 MG) BY MOUTH DAILY WITH SUPPER (Patient taking differently: Take 20 mg by mouth daily with supper.), Disp: 30 tablet, Rfl: 5   sertraline (ZOLOFT) 100 MG tablet, Take 100 mg by mouth at bedtime., Disp: , Rfl:    traZODone (DESYREL) 50 MG tablet, TAKE 1/2 TO 1 TABLET(25 TO 50 MG) BY MOUTH AT BEDTIME AS NEEDED FOR SLEEP, Disp: 30 tablet, Rfl: 3   vitamin B-12 (CYANOCOBALAMIN) 1000 MCG tablet, Take 1,000 mcg by mouth daily., Disp: , Rfl:    Tiotropium Bromide-Olodaterol (STIOLTO RESPIMAT) 2.5-2.5 MCG/ACT AERS, Inhale 2 puffs into the lungs daily. (Patient not taking: Reported on 01/07/2022), Disp: 4 g, Rfl: 5      Objective:   Vitals:  01/07/22 0928  BP: 112/70  Pulse: 91  SpO2: 93%  Weight: 206 lb (93.4 kg)  Height: '5\' 11"'$  (1.803 m)    Estimated body mass index is 28.73 kg/m as calculated from the following:   Height as of this encounter: '5\' 11"'$  (1.803 m).   Weight as of this encounter: 206 lb (93.4 kg).  '@WEIGHTCHANGE'$ @  [employer]   01/07/22 0928  Weight: 206 lb (93.4 kg)     Physical Exam    General: No distress. Looks well but obese Neuro: Alert and Oriented x 3. GCS 15. Speech normal Psych: Pleasant Resp:  Barrel Chest - no.  Wheeze - no, Crackles - YES BSE, No overt respiratory distress CVS: Normal heart sounds. Murmurs - no Ext: Stigmata of Connective Tissue Disease - no HEENT: Normal upper airway. PEERL +. No post nasal drip        Assessment:       ICD-10-CM   1. IPF (idiopathic pulmonary fibrosis) (HCC)  K48.185  Pulmonary function test    2. Medication monitoring encounter  Z51.81     3. DOE (dyspnea on exertion)  R06.09     4. Unintended weight gain  R63.5          Plan:     Patient Instructions  IPF (idiopathic pulmonary fibrosis) ([place]) Medication monitoring encounter DOE (dyspnea on exertion) Unintended weight gain   -18 pound weight gain since starting Lyrica and more likely the reason for worsening shortness of breath -Pulmonary fibrosis could be worse based on breathing test -However between the 2 I suspect weight gain is the main reason for worsening shortness of breath -Liver function test normal in October 2023   plan - check LFT in 3 months -Recheck spirometry and DLCO in 3 months - continue esbriet per schedule -Consider clinical research in the future but at this moment address weight gain due to Lyrica -Have sent a message to her neurologist about the weight gain -Also talk to primary care physician about medication for weight loss   Follow-up - 3 months Dr Chase Caller but after PFT; symptom score and walk at followup  -30-minute visit  High complex medical condition requiring high risk prescription with intensive therapeutic monitoring requirement   SIGNATURE    Dr. Brand Males, M.D., F.C.C.P,  Pulmonary and Critical Care Medicine Staff Physician, [place] Director - Interstitial Lung Disease  Program  Pulmonary [place] at [place], [place], [address]  Pager: [phone], If no answer or between  15:00h - 7:00h: call [phone] Telephone: [phone]  10:12 AM 01/07/2022

## 2022-01-11 ENCOUNTER — Other Ambulatory Visit: Payer: Self-pay | Admitting: Neurology

## 2022-01-11 ENCOUNTER — Ambulatory Visit: Payer: Self-pay | Admitting: Nurse Practitioner

## 2022-01-11 MED ORDER — AMITRIPTYLINE HCL 25 MG PO TABS: 25.0000 mg | ORAL_TABLET | Freq: Every day | ORAL | 3 refills | Status: DC

## 2022-01-11 NOTE — Progress Notes (Signed)
Patient has significant weight gain while on Lyrica for his neuropathy.  He is also on Bupropion, buspirone, sertraline and trazodone.  I want to started him on duloxetine but there is a lot of interaction and increased risk of serotonin syndrome.  I will start him on amitriptyline even though there is risk of serotonin syndrome but low when compared to Duloxetine.  If this is inadequate to control the pain, most likely I will refer him for a spinal stimulator for neuropathy.  Advised him to contact me if the pain does not resolve when he is off the Pregabalin or if he experiences any side effects.   Dr. April Manson

## 2022-01-14 ENCOUNTER — Ambulatory Visit: Payer: Self-pay | Admitting: Nurse Practitioner

## 2022-01-20 ENCOUNTER — Encounter: Payer: Self-pay | Admitting: Cardiology

## 2022-01-20 ENCOUNTER — Ambulatory Visit: Payer: Medicare Other | Attending: Cardiology | Admitting: Cardiology

## 2022-01-20 VITALS — BP 118/70 | HR 95 | Ht 71.0 in | Wt 208.4 lb

## 2022-01-20 DIAGNOSIS — I330 Acute and subacute infective endocarditis: Secondary | ICD-10-CM

## 2022-01-20 DIAGNOSIS — I10 Essential (primary) hypertension: Secondary | ICD-10-CM | POA: Diagnosis not present

## 2022-01-20 DIAGNOSIS — G4733 Obstructive sleep apnea (adult) (pediatric): Secondary | ICD-10-CM

## 2022-01-20 DIAGNOSIS — I2510 Atherosclerotic heart disease of native coronary artery without angina pectoris: Secondary | ICD-10-CM

## 2022-01-20 DIAGNOSIS — J84112 Idiopathic pulmonary fibrosis: Secondary | ICD-10-CM | POA: Diagnosis not present

## 2022-01-20 NOTE — Patient Instructions (Signed)
Medication Instructions:  Your physician recommends that you continue on your current medications as directed. Please refer to the Current Medication list given to you today.  *If you need a refill on your cardiac medications before your next appointment, please call your pharmacy*   Lab Work: None If you have labs (blood work) drawn today and your tests are completely normal, you will receive your results only by: [place] (if you have MyChart) OR A paper copy in the mail If you have any lab test that is abnormal or we need to change your treatment, we will call you to review the results.   Testing/Procedures: Your physician has requested that you have an echocardiogram.Then follow up in 2 weeks after results. Echocardiography is a painless test that uses sound waves to create images of your heart. It provides your doctor with information about the size and shape of your heart and how well your heart's chambers and valves are working. This procedure takes approximately one hour. There are no restrictions for this procedure. Please do NOT wear cologne, perfume, aftershave, or lotions (deodorant is allowed). Please arrive 15 minutes prior to your appointment time.    Follow-Up: At [facility], you and your health needs are our priority.  As part of our continuing mission to provide you with exceptional heart care, we have created designated Provider Care Teams.  These Care Teams include your primary Cardiologist (physician) and Advanced Practice Providers (APPs -  Physician Assistants and Nurse Practitioners) who all work together to provide you with the care you need, when you need it.  We recommend signing up for the patient portal called "MyChart".  Sign up information is provided on this After Visit Summary.  MyChart is used to connect with patients for Virtual Visits (Telemedicine).  Patients are able to view lab/test results, encounter notes, upcoming appointments, etc.   Non-urgent messages can be sent to your provider as well.   To learn more about what you can do with MyChart, go to [email or web address].    Your next appointment:   3 month(s)  The format for your next appointment:   In Person  Provider:   Jenne Campus, MD    Other Instructions   Important Information About Sugar

## 2022-01-20 NOTE — Addendum Note (Signed)
Addended by: Darrel Reach on: 01/20/2022 02:11 PM   Modules accepted: Orders

## 2022-01-20 NOTE — Progress Notes (Signed)
Cardiology Office Note:    Date:  01/20/2022   ID:  Robert Lang, DOB May 05, 1950, MRN [id number]  PCP:  Mackie Pai, [name]  Cardiologist:  Jenne Campus, MD    Referring MD: Mackie Pai, [place]   Chief Complaint  Patient presents with   Follow-up  Doing fine  History of Present Illness:    Robert Lang is a 71 y.o. male  with past medical history significant for coronary artery disease status post PTCA and stenting of LAD and RCA years ago, cardiac catheterization done in 2017 patent stent of proximal LAD, 90% occlusion of the small first diagonal branch 25% occlusion of the RCA with good ejection fraction of 60%, he also had atrial flutter ablation done in [facility] in 2017.  Last stress test done in 2020 showing no significant ischemia.  In 2020 October he did have PTCA and stenting with drug-eluting stent to RCA that was in face of non-STEMI.  In April he had echocardiogram done which surprisingly showed some lesion on the aortic valve raising suspicion for potential endocarditis.  However he did not have any signs and symptoms of endocarditis there was no evidence of infection also aortic valve did not have any regurgitation.  Recently he ended up having another echocardiogram done in June and that echocardiogram did not show any vegetation of the aortic valve from stable force competent.  There was some contemplation about potentially doing a transesophageal echocardiogram however this idea has being abandoned secondary to presence of idiopathic pulmonary fibrosis and poor lung condition.  Look like we do have permission to do the test from pulmonary point review however overall the fact he does not have any evidence of active infection and his valve is still competent to function properly make me to think that better watchful waiting is better approach to an aggressive TEE.  I will schedule him however to have echocardiogram done at the end of this month at the beginning of  September.  Recently he had up going to the hospital because of episode of syncope.  He was felt to be related to dehydration he had a head CT EEG both were negative. Comes today to my office for follow-up he did have repeated echocardiogram looking at aortic valve and it was done in September valve look unchanged.  Still competent there was no leak there was no stenosis.  He does not have any signs and symptoms of endocarditis.  He is coming today for follow-up denies have any chest pain tightness squeezing pressure burning chest complain of having shortness of breath also complaining of fluctuation of his blood pressure.  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 M75.00)   Allergic rhinitis due to pollen 09/03/2013   J30.1)  Formatting of this note might be different from the original. J30.1)   Allergy    Anticoagulated 07/01/2016   Anxiety    Arthritis    Atrial fibrillation ([place])    Atrial flutter ([place]) 06/26/2015   CAD (coronary artery disease)    Chest pain 06/26/2015   COPD (chronic obstructive pulmonary disease) ([place])    Coronary artery disease 07/06/2018   Cardiac catheterization 2017 showing 90% small diagonal branch disease   DDD (degenerative disc disease), cervical 09/03/2013   M50.90)  Formatting of this note might be different from the original.  M50.90)   Depression    Depression    Depressive disorder 09/03/2013   Dizziness 10/28/2017   Dyslipidemia, goal LDL below 70 07/06/2018   Dyspnea on exertion 10/20/2018   Esophageal reflux 09/03/2013   Essential hypertension 07/06/2018   ETOH abuse 01/11/2019   6 pack of beer per day   Falls 10/28/2017   GERD (gastroesophageal reflux disease)    H/O amiodarone therapy 07/01/2016   Heart attack ([place])    Heart palpitations  01/27/2017   History of colon polyps    Hyperlipidemia    Hypertension    IPF (idiopathic pulmonary fibrosis) ([place])    Kidney stones    Neck pain 09/03/2013   Neuropathy 07/06/2018   Obstructive sleep apnea 08/05/2015   PLMD (periodic limb movement disorder) 11/04/2015   Polycythemia, 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 STORY: Due to alcohol/ tobacco   Pure hypercholesterolemia 09/03/2013   E78.0)  Formatting of this note might be different from the original. E78.0)   Screening for prostate cancer 09/03/2013   Smoker 01/11/2019   Smoking 07/06/2018   Status post ablation of atrial flutter 07/06/2018   2017    Past Surgical History:  Procedure Laterality Date   ATRIAL FIBRILLATION ABLATION  10/2015   BACK SURGERY     CARDIOVERSION  2017   CATARACT EXTRACTION Bilateral 2017   March and April 2017   COLONOSCOPY  2018   CORONARY STENT PLACEMENT  2012   CORONARY/GRAFT ACUTE MI REVASCULARIZATION N/A 01/10/2019   Procedure: CORONARY/GRAFT ACUTE MI REVASCULARIZATION;  Surgeon: Leonie Man, MD;  Location: [facility];  Service: Cardiovascular;  Laterality: N/A;   INGUINAL HERNIA REPAIR     over 20 years ago   LAPAROSCOPIC APPENDECTOMY N/A 01/17/2021   Procedure: APPENDECTOMY LAPAROSCOPIC;  Surgeon: Felicie Morn, MD;  Location: [facility];  Service: General;  Laterality: N/A;   LEFT HEART CATH AND CORONARY ANGIOGRAPHY N/A 01/10/2019   Procedure: LEFT HEART CATH AND CORONARY ANGIOGRAPHY;  Surgeon: Leonie Man, MD;  Location: [facility];  Service: Cardiovascular;  Laterality: N/A;   LUMBAR LAMINECTOMY Bilateral 04/05/2016   L2-L5    VENTRAL HERNIA REPAIR  2018    Current Medications: Current Meds  Medication Sig   amitriptyline (ELAVIL) 25 MG tablet Take 1 tablet (25 mg total) by mouth at bedtime.   atorvastatin (LIPITOR) 80 MG tablet Take 1 tablet (80 mg total) by mouth at  bedtime.   buPROPion (WELLBUTRIN XL) 300 MG 24 hr tablet Take 1 tablet (300 mg total) by mouth daily.   busPIRone (BUSPAR) 15 MG tablet Take 15 mg by mouth 2 (two) times daily.   Cholecalciferol 25 MCG (1000 UT) tablet Take 1,000 Units by mouth daily.    docusate sodium (COLACE) 100 MG capsule Take 100 mg by mouth daily.   famotidine (PEPCID) 40 MG tablet Take 40 mg by mouth at bedtime.   fexofenadine (ALLEGRA) 180 MG tablet Take 180 mg by mouth at bedtime.    finasteride (PROSCAR) 5 MG tablet Take 1 tablet (5 mg total) by mouth daily.   Ginger, Zingiber officinalis, (GINGER ROOT) 550 MG CAPS Take 550 mg by mouth daily.   melatonin 5 MG TABS Take 10 mg by mouth at bedtime.   Multiple Vitamins-Minerals (PRESERVISION AREDS PO) Take 1 capsule by mouth in the morning and at bedtime.   nitroGLYCERIN (NITROSTAT) 0.4 MG SL tablet Place 1  tablet (0.4 mg total) under the tongue every 5 (five) minutes as needed for chest pain.   pantoprazole (PROTONIX) 40 MG tablet Take 1 tablet (40 mg total) by mouth daily.   Pirfenidone (ESBRIET) 801 MG TABS Take 801 mg by mouth 3 (three) times daily.   Polyethylene Glycol 3350 (MIRALAX PO) Take 17 g by mouth daily.   pregabalin (LYRICA) 150 MG capsule Take 150 mg by mouth daily. Plan to d/c 01/26/22   ranolazine (RANEXA) 1000 MG SR tablet Take 1 tablet (1,000 mg total) by mouth 2 (two) times daily.   rivaroxaban (XARELTO) 20 MG TABS tablet TAKE 1 TABLET(20 MG) BY MOUTH DAILY WITH SUPPER (Patient taking differently: Take 20 mg by mouth daily with supper.)   sertraline (ZOLOFT) 100 MG tablet Take 100 mg by mouth at bedtime.   traZODone (DESYREL) 50 MG tablet TAKE 1/2 TO 1 TABLET(25 TO 50 MG) BY MOUTH AT BEDTIME AS NEEDED FOR SLEEP (Patient taking differently: Take 75 mg by mouth at bedtime as needed for sleep.)   vitamin B-12 (CYANOCOBALAMIN) 1000 MCG tablet Take 1,000 mcg by mouth daily.   [DISCONTINUED] acetaminophen (TYLENOL) 325 MG tablet Take 2 tablets (650 mg  total) by mouth every 6 (six) hours as needed. (Patient taking differently: Take 650 mg by mouth every 6 (six) hours as needed for mild pain or moderate pain.)   [DISCONTINUED] fluticasone (FLONASE) 50 MCG/ACT nasal spray Place 1 spray into both nostrils daily as needed for allergies or rhinitis.   [DISCONTINUED] NON FORMULARY 1 each by Other route See admin instructions. CPAP at bedtime   [DISCONTINUED] ondansetron (ZOFRAN) 4 MG tablet TAKE 1 TABLET(4 MG) BY MOUTH EVERY 8 HOURS AS NEEDED FOR NAUSEA OR VOMITING (Patient taking differently: Take 4 mg by mouth every 8 (eight) hours as needed for nausea or vomiting.)   [DISCONTINUED] Tiotropium Bromide-Olodaterol (STIOLTO RESPIMAT) 2.5-2.5 MCG/ACT AERS Inhale 2 puffs into the lungs daily.     Allergies:   Naproxen and Silodosin   Social History   Socioeconomic History   Marital status: Married    Spouse name: Not on file   Number of children: Not on file   Years of education: Not on file   Highest education level: Not on file  Occupational History   Not on file  Tobacco Use   Smoking status: Former    Packs/day: 1.00    Years: 30.00    Total pack years: 30.00    Types: Cigarettes    Quit date: 01/10/2019    Years since quitting: 3.0   Smokeless tobacco: Former  Scientific laboratory technician Use: Never used  Substance and Sexual Activity   Alcohol use: Yes    Comment: 8oz of wine a day   Drug use: Never   Sexual activity: Not on file  Other Topics Concern   Not on file  Social History Narrative   Not on file   Social Determinants of Health   Financial Resource Strain: Low Risk  (12/22/2021)   Overall Financial Resource Strain (CARDIA)    Difficulty of Paying Living Expenses: Not hard at all  Food Insecurity: No Food Insecurity (12/22/2021)   Hunger Vital Sign    Worried About Running Out of Food in the Last Year: Never true    [place] in the Last Year: Never true  Transportation Needs: No Transportation Needs (12/22/2021)    PRAPARE - Hydrologist (Medical): No    Lack of Transportation (  Non-Medical): No  Physical Activity: Inactive (12/22/2021)   Exercise Vital Sign    Days of Exercise per Week: 0 days    Minutes of Exercise per Session: 0 min  Stress: No Stress Concern Present (12/22/2021)   [place]    Feeling of Stress : Not at all  Social Connections: Moderately Integrated (12/22/2021)   Social Connection and Isolation Panel [NHANES]    Frequency of Communication with Friends and Family: More than three times a week    Frequency of Social Gatherings with Friends and Family: More than three times a week    Attends Religious Services: More than 4 times per year    Active Member of [employer] or Organizations: No    Attends Archivist Meetings: Never    Marital Status: Married     Family History: The patient's family history includes Colon cancer in his father; Throat cancer in his brother. ROS:   Please see the history of present illness.    All 14 point review of systems negative except as described per history of present illness  EKGs/Labs/Other Studies Reviewed:      Recent Labs: 08/26/2021: Magnesium 2.2 09/02/2021: Brain Natriuretic Peptide 37; Hemoglobin 14.9; Platelets 278 12/30/2021: ALT 19; BUN 13; Creatinine, Ser 0.84; Potassium 4.2; Sodium 138  Recent Lipid Panel    Component Value Date/Time   CHOL 144 12/30/2021 0944   CHOL 128 04/03/2020 0951   TRIG 342.0 (H) 12/30/2021 0944   HDL 25.00 (L) 12/30/2021 0944   HDL 40 04/03/2020 0951   CHOLHDL 6 12/30/2021 0944   VLDL 68.4 (H) 12/30/2021 0944   LDLCALC 58 04/03/2020 0951   LDLDIRECT 80.0 12/30/2021 0944    Physical Exam:    VS:  BP 118/70 (BP Location: Left Arm, Patient Position: Sitting)   Pulse 95   Ht '5\' 11"'$  (1.803 m)   Wt 208 lb 6.4 oz (94.5 kg)   SpO2 93%   BMI 29.07 kg/m     Wt Readings from Last 3 Encounters:   01/20/22 208 lb 6.4 oz (94.5 kg)  01/07/22 206 lb (93.4 kg)  12/29/21 203 lb 9.6 oz (92.4 kg)     GEN:  Well nourished, well developed in no acute distress HEENT: Normal NECK: No JVD; No carotid bruits LYMPHATICS: No lymphadenopathy CARDIAC: RRR, no murmurs, no rubs, no gallops RESPIRATORY: Bowel faint crackles ABDOMEN: Soft, non-tender, non-distended MUSCULOSKELETAL:  No edema; No deformity  SKIN: Warm and dry LOWER EXTREMITIES: no swelling NEUROLOGIC:  Alert and oriented x 3 PSYCHIATRIC:  Normal affect   ASSESSMENT:    1. Coronary artery disease involving native coronary artery of native heart without angina pectoris   2. Essential hypertension   3. Vegetation of heart valve   4. IPF (idiopathic pulmonary fibrosis) ([place])   5. OSA (obstructive sleep apnea)    PLAN:    In order of problems listed above:  Coronary disease seems to be doing well from that point review.  On appropriate medication which I will continue. Question about endocarditis of the aortic valve no signs and symptoms of endocarditis his echocardiogram showed unchanged potential lesion on the aortic valve.  I did review echocardiogram myself, I will repeat echocardiogram sometimes in February or March. Essential hypertension blood pressure majority of time well controlled continue present management. Idiopathic pulmonary fibrosis: Pending follow-up by intern medicine team and pulmonary like always excellently. Dyslipidemia I did review his K PN which show me his LDL of  58 HDL 25.  We will continue present management which involved high intense statin for of Lipitor 80.   Medication Adjustments/Labs and Tests Ordered: Current medicines are reviewed at length with the patient today.  Concerns regarding medicines are outlined above.  No orders of the defined types were placed in this encounter.  Medication changes: No orders of the defined types were placed in this encounter.   Signed, Park Liter,  MD, [facility] 01/20/2022 1:58 PM    [facility]

## 2022-01-21 ENCOUNTER — Encounter: Payer: Self-pay | Admitting: Nurse Practitioner

## 2022-01-21 ENCOUNTER — Ambulatory Visit (INDEPENDENT_AMBULATORY_CARE_PROVIDER_SITE_OTHER): Payer: Medicare Other | Admitting: Nurse Practitioner

## 2022-01-21 VITALS — BP 127/82 | HR 97 | Ht 71.0 in | Wt 205.6 lb

## 2022-01-21 DIAGNOSIS — F411 Generalized anxiety disorder: Secondary | ICD-10-CM

## 2022-01-21 DIAGNOSIS — G4700 Insomnia, unspecified: Secondary | ICD-10-CM | POA: Diagnosis not present

## 2022-01-21 DIAGNOSIS — F32A Depression, unspecified: Secondary | ICD-10-CM | POA: Diagnosis not present

## 2022-01-21 MED ORDER — SERTRALINE HCL 100 MG PO TABS: 100.0000 mg | ORAL_TABLET | Freq: Every day | ORAL | 0 refills | Status: DC

## 2022-01-21 MED ORDER — TRAZODONE HCL 50 MG PO TABS: 50.0000 mg | ORAL_TABLET | Freq: Every evening | ORAL | 0 refills | Status: DC | PRN

## 2022-01-21 MED ORDER — BUSPIRONE HCL 15 MG PO TABS: 15.0000 mg | ORAL_TABLET | Freq: Two times a day (BID) | ORAL | 0 refills | Status: AC

## 2022-01-21 MED ORDER — BUPROPION HCL ER (XL) 300 MG PO TB24: 300.0000 mg | ORAL_TABLET | Freq: Every day | ORAL | 0 refills | Status: DC

## 2022-01-21 NOTE — Progress Notes (Signed)
[facility] MD/PA/NP Initial Note  01/21/2022 12:42 PM Robert Lang  MRN:  [id number]  Chief Complaint:  Chief Complaint   Anxiety; Depression; Stress; Insomnia     HPI: 71 yo white male presents to establish care and obtain medication refills.  He has been seeing a psychiatrist from [facility]. States that when he began at [employer], he was very suicidal, but denies SI/HI currently. He decided to come to [facility] because his doctor did not show up for his last appointment. He is doing well, just c/o mild difficulty sleeping 2/2 cpap and recent season/weather change.  Hx of IPF and neuropathy. Pt has mild difficulty breathing and talking for prolonged period. Mild hearing impaired.   BP low today- states he took oxycodone last night. States it is normal and fluctuates. States it was 117/67 yesterday and 134/80 the day before.  Mood- mild depression and anxiety. Denies SI/HI. Sleep- gets up 2-3 times to drink water 2/2 wearing cpap/ dry mouth. States cpap disrupts sleep.  Activity- minimal due to IPF. Ambulates with a cane. Plans to begin walking some, as tolerated. Concentration- Loses train of thought easily at times.  Visit Diagnosis:    ICD-10-CM   1. Depressive disorder  F32.A sertraline (ZOLOFT) 100 MG tablet    buPROPion (WELLBUTRIN XL) 300 MG 24 hr tablet    2. Generalized anxiety disorder  F41.1 sertraline (ZOLOFT) 100 MG tablet    busPIRone (BUSPAR) 15 MG tablet    buPROPion (WELLBUTRIN XL) 300 MG 24 hr tablet    3. 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 M75.00)   Allergic rhinitis due to pollen 09/03/2013   J30.1)   Formatting of this note might be different from the original. J30.1)   Allergy    Anticoagulated 07/01/2016   Anxiety    Arthritis    Atrial fibrillation ([place])    Atrial flutter ([place]) 06/26/2015   CAD (coronary artery disease)    Chest pain 06/26/2015   COPD (chronic obstructive pulmonary disease) ([place])    Coronary artery disease 07/06/2018   Cardiac catheterization 2017 showing 90% small diagonal branch disease   DDD (degenerative disc disease), cervical 09/03/2013   M50.90)  Formatting of this note might be different from the original. M50.90)   Depression    Depression    Depressive disorder 09/03/2013   Dizziness 10/28/2017   Dyslipidemia, goal LDL below 70 07/06/2018   Dyspnea on exertion 10/20/2018   Esophageal reflux 09/03/2013   Essential hypertension 07/06/2018   ETOH abuse 01/11/2019   6 pack of beer per day   Falls 10/28/2017   GERD (gastroesophageal reflux disease)    H/O amiodarone therapy 07/01/2016   Heart attack ([place])    Heart palpitations 01/27/2017   History of colon polyps    Hyperlipidemia    Hypertension    IPF (idiopathic pulmonary fibrosis) ([place])    Kidney stones    Neck pain 09/03/2013   Neuropathy 07/06/2018   Obstructive sleep apnea 08/05/2015   PLMD (periodic limb movement disorder) 11/04/2015   Polycythemia, 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 STORY:  Due to alcohol/ tobacco   Pure hypercholesterolemia 09/03/2013   E78.0)  Formatting of this note might be different from the original. E78.0)   Screening for prostate cancer 09/03/2013   Smoker 01/11/2019   Smoking 07/06/2018   Status post ablation of atrial flutter 07/06/2018   2017    Past Surgical History:  Procedure Laterality Date   ATRIAL FIBRILLATION ABLATION  10/2015   BACK SURGERY     CARDIOVERSION  2017   CATARACT EXTRACTION Bilateral 2017   March and April 2017   COLONOSCOPY  2018   CORONARY STENT  PLACEMENT  2012   CORONARY/GRAFT ACUTE MI REVASCULARIZATION N/A 01/10/2019   Procedure: CORONARY/GRAFT ACUTE MI REVASCULARIZATION;  Surgeon: Leonie Man, MD;  Location: [facility];  Service: Cardiovascular;  Laterality: N/A;   INGUINAL HERNIA REPAIR     over 20 years ago   LAPAROSCOPIC APPENDECTOMY N/A 01/17/2021   Procedure: APPENDECTOMY LAPAROSCOPIC;  Surgeon: Felicie Morn, MD;  Location: [facility];  Service: General;  Laterality: N/A;   LEFT HEART CATH AND CORONARY ANGIOGRAPHY N/A 01/10/2019   Procedure: LEFT HEART CATH AND CORONARY ANGIOGRAPHY;  Surgeon: Leonie Man, MD;  Location: [facility];  Service: Cardiovascular;  Laterality: N/A;   LUMBAR LAMINECTOMY Bilateral 04/05/2016   L2-L5    VENTRAL HERNIA REPAIR  2018    Family Psychiatric History:   Family History:  Family History  Problem Relation Age of Onset   Anxiety disorder Mother    Alcohol abuse Father    Colon cancer Father    Depression Brother    Alcohol abuse Brother    Throat cancer Brother     Social History:  Social History   Socioeconomic History   Marital status: Married    Spouse name: Not on file   Number of children: Not on file   Years of education: Not on file   Highest education level: Not on file  Occupational History   Not on file  Tobacco Use   Smoking status: Former    Packs/day: 1.00    Years: 30.00    Total pack years: 30.00    Types: Cigarettes    Quit date: 01/10/2019    Years since quitting: 3.0   Smokeless tobacco: Former  Scientific laboratory technician Use: Never used  Substance and Sexual Activity   Alcohol use: Yes    Comment: 8oz of wine a day   Drug use: Never   Sexual activity: Not on file  Other Topics Concern   Not on file  Social History Narrative   Not on file   Social Determinants of Health   Financial Resource Strain: Low Risk  (12/22/2021)   Overall Financial Resource Strain (CARDIA)    Difficulty of Paying Living Expenses: Not hard at  all  Food Insecurity: No Food Insecurity (12/22/2021)   Hunger Vital Sign    Worried About Running Out of Food in the Last Year: Never true    [place] in the Last Year: Never true  Transportation Needs: No Transportation Needs (12/22/2021)   PRAPARE - Hydrologist (Medical): No    Lack of Transportation (Non-Medical): No  Physical Activity: Inactive (12/22/2021)   Exercise Vital Sign    Days of Exercise per Week: 0 days    Minutes of Exercise per Session: 0 min  Stress: No Stress Concern Present (12/22/2021)   [place]  Feeling of Stress : Not at all  Social Connections: Moderately Integrated (12/22/2021)   Social Connection and Isolation Panel [NHANES]    Frequency of Communication with Friends and Family: More than three times a week    Frequency of Social Gatherings with Friends and Family: More than three times a week    Attends Religious Services: More than 4 times per year    Active Member of [employer] or Organizations: No    Attends Archivist Meetings: Never    Marital Status: Married    Allergies:  Allergies  Allergen Reactions   Naproxen Other (See Comments)    (Naprosyn *ANALGESICS - ANTI-INFLAMMATORY*) Nausea, Abdominal pain   Silodosin Other (See Comments)    Low blood pressure    Metabolic Disorder Labs: Lab Results  Component Value Date   HGBA1C 5.9 12/30/2021   MPG 105.41 01/11/2019   No results found for: "PROLACTIN" Lab Results  Component Value Date   CHOL 144 12/30/2021   TRIG 342.0 (H) 12/30/2021   HDL 25.00 (L) 12/30/2021   CHOLHDL 6 12/30/2021   VLDL 68.4 (H) 12/30/2021   LDLCALC 58 04/03/2020   LDLCALC 40 10/26/2019   Lab Results  Component Value Date   TSH 1.232 01/16/2021    Therapeutic Level Labs: No results found for: "LITHIUM" No results found for: "VALPROATE" No results found for: "CBMZ"  Current Medications: Current  Outpatient Medications  Medication Sig Dispense Refill   amitriptyline (ELAVIL) 25 MG tablet Take 1 tablet (25 mg total) by mouth at bedtime. 30 tablet 3   atorvastatin (LIPITOR) 80 MG tablet Take 1 tablet (80 mg total) by mouth at bedtime. 90 tablet 3   Cholecalciferol 25 MCG (1000 UT) tablet Take 1,000 Units by mouth daily.      docusate sodium (COLACE) 100 MG capsule Take 100 mg by mouth daily.     famotidine (PEPCID) 40 MG tablet Take 40 mg by mouth at bedtime.     fexofenadine (ALLEGRA) 180 MG tablet Take 180 mg by mouth at bedtime.      finasteride (PROSCAR) 5 MG tablet Take 1 tablet (5 mg total) by mouth daily. 90 tablet 1   Ginger, Zingiber officinalis, (GINGER ROOT) 550 MG CAPS Take 550 mg by mouth daily.     melatonin 5 MG TABS Take 10 mg by mouth at bedtime.     Multiple Vitamins-Minerals (PRESERVISION AREDS PO) Take 1 capsule by mouth in the morning and at bedtime.     nitroGLYCERIN (NITROSTAT) 0.4 MG SL tablet Place 1 tablet (0.4 mg total) under the tongue every 5 (five) minutes as needed for chest pain. 25 tablet 3   pantoprazole (PROTONIX) 40 MG tablet Take 1 tablet (40 mg total) by mouth daily. 90 tablet 3   Pirfenidone (ESBRIET) 801 MG TABS Take 801 mg by mouth 3 (three) times daily. 270 tablet 1   Polyethylene Glycol 3350 (MIRALAX PO) Take 17 g by mouth daily.     pregabalin (LYRICA) 150 MG capsule Take 150 mg by mouth daily. Plan to d/c 01/26/22     ranolazine (RANEXA) 1000 MG SR tablet Take 1 tablet (1,000 mg total) by mouth 2 (two) times daily. 180 tablet 2   rivaroxaban (XARELTO) 20 MG TABS tablet TAKE 1 TABLET(20 MG) BY MOUTH DAILY WITH SUPPER (Patient taking differently: Take 20 mg by mouth daily with supper.) 30 tablet 5   vitamin B-12 (CYANOCOBALAMIN) 1000 MCG tablet Take 1,000 mcg by mouth daily.     buPROPion [facility]  XL) 300 MG 24 hr tablet Take 1 tablet (300 mg total) by mouth daily. 90 tablet 0   busPIRone (BUSPAR) 15 MG tablet Take 1 tablet (15 mg total) by  mouth 2 (two) times daily. 180 tablet 0   sertraline (ZOLOFT) 100 MG tablet Take 1 tablet (100 mg total) by mouth at bedtime. 90 tablet 0   traZODone (DESYREL) 50 MG tablet Take 1 tablet (50 mg total) by mouth at bedtime as needed for sleep (May take 1/2 tab to 1 tablet for sleep, as needed.). 90 tablet 0   No current facility-administered medications for this visit.    Medication Side Effects: dry mouth and weight gain  Orders placed this visit:  No orders of the defined types were placed in this encounter.   Psychiatric Specialty Exam:  Review of Systems  Constitutional: Negative.   HENT: Negative.    Eyes: Negative.   Respiratory:  Positive for shortness of breath.   Cardiovascular: Negative.   Gastrointestinal: Negative.   Endocrine: Negative.   Genitourinary: Negative.   Musculoskeletal:  Positive for back pain.  Skin: Negative.   Allergic/Immunologic: Negative.   Neurological:  Positive for dizziness, speech difficulty and weakness.  Hematological:  Bruises/bleeds easily.  Psychiatric/Behavioral:  Positive for dysphoric mood. The patient is nervous/anxious.     Blood pressure 127/82, pulse 97, height '5\' 11"'$  (1.803 m), weight 205 lb 9.6 oz (93.3 kg).Body mass index is 28.68 kg/m.  General Appearance: Well Groomed  Eye Contact:  Good  Speech:  Clear and Coherent  Volume:  Normal  Mood:  Euthymic  Affect:  Appropriate and Congruent  Thought Process:  Coherent  Orientation:  Full (Time, Place, and Person)  Thought Content: Logical   Suicidal Thoughts:  No  Homicidal Thoughts:  No  Memory:  Remote;   Fair  Judgement:  Good  Insight:  Good  Psychomotor Activity:  Negative and Normal  Concentration:  Concentration: Fair  Recall:  [place] of Knowledge: Good  Language: Good  Assets:  Financial Resources/Insurance Housing Social Support Transportation  ADL's:  Intact  Cognition: WNL  Prognosis:  Good   Screenings:  CAGE-AID    Flowsheet Row ED to  Hosp-Admission (Discharged) from 01/15/2021 in [place] Score 0      PHQ2-9    Flowsheet Row Clinical Support from 12/22/2021 in [facility] [place] from 11/25/2020 in [facility] Visit from 10/13/2020 in [facility] [place]  PHQ-2 Total Score 0 1 6  PHQ-9 Total Score -- -- [facility] ED to Hosp-Admission (Discharged) from 08/26/2021 in [facility] ED from 05/13/2021 in [facility] ED to Hosp-Admission (Discharged) from 01/15/2021 in [facility] CATEGORY No Risk No Risk No Risk       Receiving Psychotherapy: No   Treatment Plan/Recommendations: Will continue current medication regimen. Consider increasing Trazodone at next visit if still having some difficulty adapting to change in weather/circadian rhythm.     Azarius Lambson, Mort Sawyers, NP

## 2022-01-21 NOTE — Patient Instructions (Signed)
3 months or sooner, if needed.

## 2022-02-01 ENCOUNTER — Encounter: Payer: Self-pay | Admitting: Neurology

## 2022-02-02 MED ORDER — AMITRIPTYLINE HCL 25 MG PO TABS: 25.0000 mg | ORAL_TABLET | Freq: Two times a day (BID) | ORAL | 0 refills | Status: DC

## 2022-02-02 NOTE — Telephone Encounter (Signed)
Please call and inform the patient to increase amitriptyline to 25 mg twice a day for one week then further increase to 50 mg twice a day. Ask him if he is still interested in spinal stimulation for Neuropathy so I can refer him out. Thanks

## 2022-02-02 NOTE — Telephone Encounter (Signed)
I called pt and discussed recommendation he is agreeable to medication change, but would like to check with his insurance about spinal cord stimulation to see if this option would be covered for him.  He will let us know in the future if he would lie to pursue this option.

## 2022-02-16 MED ORDER — AMITRIPTYLINE HCL 50 MG PO TABS: 50.0000 mg | ORAL_TABLET | Freq: Two times a day (BID) | ORAL | 5 refills | Status: DC

## 2022-02-22 ENCOUNTER — Other Ambulatory Visit: Payer: Self-pay | Admitting: Cardiology

## 2022-02-27 ENCOUNTER — Other Ambulatory Visit: Payer: Self-pay | Admitting: Neurology

## 2022-03-01 ENCOUNTER — Encounter: Payer: Self-pay | Admitting: Neurology

## 2022-03-01 ENCOUNTER — Ambulatory Visit: Payer: Medicare Other | Admitting: Neurology

## 2022-03-01 VITALS — BP 117/71 | HR 101 | Ht 71.0 in | Wt 205.0 lb

## 2022-03-01 DIAGNOSIS — G629 Polyneuropathy, unspecified: Secondary | ICD-10-CM

## 2022-03-01 DIAGNOSIS — G3184 Mild cognitive impairment, so stated: Secondary | ICD-10-CM

## 2022-03-01 MED ORDER — LIDOCAINE 4 % EX LOTN: TOPICAL_LOTION | CUTANEOUS | 0 refills | Status: DC

## 2022-03-01 MED ORDER — ZOLPIDEM TARTRATE ER 6.25 MG PO TBCR: 6.2500 mg | EXTENDED_RELEASE_TABLET | Freq: Every evening | ORAL | 0 refills | Status: DC | PRN

## 2022-03-01 NOTE — Patient Instructions (Signed)
Discontinue amitriptyline Discontinue Lyrica Trial of lidocaine cream Trial of Ambien to help with sleep Contact me in about a month if the lidocaine cream is not helpful, at that time we will consider additional medications versus spinal cord stimulator Follow-up in 3 months or sooner if worse

## 2022-03-01 NOTE — Progress Notes (Signed)
[facility]  PATIENT: Robert Lang DOB: 01-11-1951  REQUESTING CLINICIAN: [name], Percell Miller, [name] HISTORY FROM: Patient and spouse  REASON FOR VISIT: Recurrent syncope    HISTORICAL  CHIEF COMPLAINT:  Chief Complaint  Patient presents with   Follow-up    Rm 12, alone Denies new syncope events, c/o lack of sleep and dizzy spells, states amitriptyline is not working for him    INTERVAL HISTORY 03/01/2022:  Patient presents today for follow-up, at last visit we had planned to start him on pregabalin.  Initially he was doing well in terms of the neuropathic pain but has developed weight gain which in turn compromised his respiratory effort.  After discussion with his pulmonologist and with patient, we decided to discontinue Lyrica and start patient on amitriptyline.  He reports that amitriptyline is not helpful, it is not helpful with the neuropathy pain at night which keeps him from sleep. Last night, he had to take 4 Tylenol in order to control the pain and obtain some rest.    INTERVAL HISTORY 11/18/2021:  Patient presents today for follow-up, since last visit on 6/27, he denies any additional falls.  His current complaint right now is bilateral feet numbness, and pain.  He reports being diagnosed with neuropathy for many years but lately he has been experiencing burning pain in both feet.  The pain will keep him up at night.  He is on gabapentin 400 mg at nighttime but sometimes he would take extra dose in the middle of the night if the pain wakes him up.  Denies any involvement of the legs, but reports at times he will have sharp shooting pain that starts from the left-sided back all the way down to the toes.   HISTORY OF PRESENT ILLNESS:  This is a 71 year old male with multiple medical conditions including hypertension, hyperlipidemia, atrial fibrillation, COPD, pulmonary fibrosis who is presenting after being admitted to the hospital after multiple syncopal  episodes.  Patient reports in end of May he got tested positive for COVID, he was having symptoms and was started on Plaxovid which he completed on June 4.  During this time while he was battling COVID, he did have generalized weakness, he actually had a fall and had difficulty getting of the floor, and on June 6 while being out with his wife had a syncopal episode while in the car.  Prior to the syncope he did complain of dizziness and feeling weak. He presented to [facility], admitted from the 6 to the 7 for syncope, initial work-up including MRI Brain was negative for any etiology of the syncope. On June 9 he presented again to [facility] hospital for 2 additional syncope, prior to the episode he reported feeling dizzy.  In the ED he had 1 syncopal episode which was witnessed and there is report of left leg shaking with the syncope. There was no associated urinary incontinence, no tongue biting and no postictal fusion Again patient was admitted, work-up was unrevealing, he did have overnight EEG which was negative.  Again he was diagnosed with convulsive syncope likely due to dehydration and related to recent COVID infection. Since being discharged from the hospital, he reports feeling better, doing pretty good, he feels weak, feel drained but again no additional syncopal episode.  He is scheduled to see his pulmonologist on 5 July    OTHER MEDICAL CONDITIONS: Pulmonary fibrosis, depression/anxiety, hypertension, hyperlipidemia, atrial fibrillation, COPD, obstructive sleep apnea on CPAP   REVIEW OF SYSTEMS: Full 14 system review  of systems performed and negative with exception of: as noted in the HPI   ALLERGIES: Allergies  Allergen Reactions   Naproxen Other (See Comments)    (Naprosyn *ANALGESICS - ANTI-INFLAMMATORY*) Nausea, Abdominal pain   Silodosin Other (See Comments)    Low blood pressure    HOME MEDICATIONS: Outpatient Medications Prior to Visit  Medication Sig Dispense Refill    atorvastatin (LIPITOR) 80 MG tablet Take 1 tablet (80 mg total) by mouth at bedtime. 90 tablet 3   buPROPion (WELLBUTRIN XL) 300 MG 24 hr tablet Take 1 tablet (300 mg total) by mouth daily. 90 tablet 0   busPIRone (BUSPAR) 15 MG tablet Take 1 tablet (15 mg total) by mouth 2 (two) times daily. 180 tablet 0   Cholecalciferol 25 MCG (1000 UT) tablet Take 1,000 Units by mouth daily.      docusate sodium (COLACE) 100 MG capsule Take 100 mg by mouth daily.     famotidine (PEPCID) 40 MG tablet TAKE 1 TABLET(40 MG) BY MOUTH AT BEDTIME 90 tablet 2   fexofenadine (ALLEGRA) 180 MG tablet Take 180 mg by mouth at bedtime.      finasteride (PROSCAR) 5 MG tablet Take 1 tablet (5 mg total) by mouth daily. 90 tablet 1   Ginger, Zingiber officinalis, (GINGER ROOT) 550 MG CAPS Take 550 mg by mouth daily.     melatonin 5 MG TABS Take 10 mg by mouth at bedtime.     Multiple Vitamins-Minerals (PRESERVISION AREDS PO) Take 1 capsule by mouth in the morning and at bedtime.     nitroGLYCERIN (NITROSTAT) 0.4 MG SL tablet Place 1 tablet (0.4 mg total) under the tongue every 5 (five) minutes as needed for chest pain. 25 tablet 3   pantoprazole (PROTONIX) 40 MG tablet Take 1 tablet (40 mg total) by mouth daily. 90 tablet 3   Pirfenidone (ESBRIET) 801 MG TABS Take 801 mg by mouth 3 (three) times daily. 270 tablet 1   Polyethylene Glycol 3350 (MIRALAX PO) Take 17 g by mouth daily.     ranolazine (RANEXA) 1000 MG SR tablet Take 1 tablet (1,000 mg total) by mouth 2 (two) times daily. 180 tablet 2   rivaroxaban (XARELTO) 20 MG TABS tablet TAKE 1 TABLET(20 MG) BY MOUTH DAILY WITH SUPPER (Patient taking differently: Take 20 mg by mouth daily with supper.) 30 tablet 5   sertraline (ZOLOFT) 100 MG tablet Take 1 tablet (100 mg total) by mouth at bedtime. 90 tablet 0   traZODone (DESYREL) 50 MG tablet Take 1 tablet (50 mg total) by mouth at bedtime as needed for sleep (May take 1/2 tab to 1 tablet for sleep, as needed.). 90 tablet 0    vitamin B-12 (CYANOCOBALAMIN) 1000 MCG tablet Take 1,000 mcg by mouth daily.     amitriptyline (ELAVIL) 50 MG tablet Take 1 tablet (50 mg total) by mouth 2 (two) times daily. 60 tablet 5   pregabalin (LYRICA) 150 MG capsule Take 150 mg by mouth daily. Plan to d/c 01/26/22     No facility-administered medications prior to visit.    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 M75.00)   Allergic rhinitis due to pollen 09/03/2013   J30.1)  Formatting of this note might be different from the original. J30.1)   Allergy  Anticoagulated 07/01/2016   Anxiety    Arthritis    Atrial fibrillation ([place])    Atrial flutter ([place]) 06/26/2015   CAD (coronary artery disease)    Chest pain 06/26/2015   COPD (chronic obstructive pulmonary disease) ([place])    Coronary artery disease 07/06/2018   Cardiac catheterization 2017 showing 90% small diagonal branch disease   DDD (degenerative disc disease), cervical 09/03/2013   M50.90)  Formatting of this note might be different from the original. M50.90)   Depression    Depression    Depressive disorder 09/03/2013   Dizziness 10/28/2017   Dyslipidemia, goal LDL below 70 07/06/2018   Dyspnea on exertion 10/20/2018   Esophageal reflux 09/03/2013   Essential hypertension 07/06/2018   ETOH abuse 01/11/2019   6 pack of beer per day   Falls 10/28/2017   GERD (gastroesophageal reflux disease)    H/O amiodarone therapy 07/01/2016   Heart attack ([place])    Heart palpitations 01/27/2017   History of colon polyps    Hyperlipidemia    Hypertension    IPF (idiopathic pulmonary fibrosis) ([place])    Kidney stones    Neck pain 09/03/2013   Neuropathy 07/06/2018   Obstructive sleep apnea 08/05/2015   PLMD (periodic limb movement disorder)  11/04/2015   Polycythemia, 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 STORY: Due to alcohol/ tobacco   Pure hypercholesterolemia 09/03/2013   E78.0)  Formatting of this note might be different from the original. E78.0)   Screening for prostate cancer 09/03/2013   Smoker 01/11/2019   Smoking 07/06/2018   Status post ablation of atrial flutter 07/06/2018   2017    PAST SURGICAL HISTORY: Past Surgical History:  Procedure Laterality Date   ATRIAL FIBRILLATION ABLATION  10/2015   BACK SURGERY     CARDIOVERSION  2017   CATARACT EXTRACTION Bilateral 2017   March and April 2017   COLONOSCOPY  2018   CORONARY STENT PLACEMENT  2012   CORONARY/GRAFT ACUTE MI REVASCULARIZATION N/A 01/10/2019   Procedure: CORONARY/GRAFT ACUTE MI REVASCULARIZATION;  Surgeon: Leonie Man, MD;  Location: [facility];  Service: Cardiovascular;  Laterality: N/A;   INGUINAL HERNIA REPAIR     over 20 years ago   LAPAROSCOPIC APPENDECTOMY N/A 01/17/2021   Procedure: APPENDECTOMY LAPAROSCOPIC;  Surgeon: Felicie Morn, MD;  Location: [facility];  Service: General;  Laterality: N/A;   LEFT HEART CATH AND CORONARY ANGIOGRAPHY N/A 01/10/2019   Procedure: LEFT HEART CATH AND CORONARY ANGIOGRAPHY;  Surgeon: Leonie Man, MD;  Location: [facility];  Service: Cardiovascular;  Laterality: N/A;   LUMBAR LAMINECTOMY Bilateral 04/05/2016   L2-L5    VENTRAL HERNIA REPAIR  2018    FAMILY HISTORY: Family History  Problem Relation Age of Onset   Anxiety disorder Mother    Alcohol abuse Father    Colon cancer Father    Depression Brother    Alcohol abuse Brother    Throat cancer Brother     SOCIAL HISTORY: Social History   Socioeconomic History   Marital status: Married    Spouse name: Not on file   Number of children: Not on file   Years of education: Not on file   Highest education level: Not on file  Occupational  History   Not on file  Tobacco Use   Smoking status: Former    Packs/day: 1.00    Years: 30.00  Total pack years: 30.00    Types: Cigarettes    Quit date: 01/10/2019    Years since quitting: 3.1   Smokeless tobacco: Former  Scientific laboratory technician Use: Never used  Substance and Sexual Activity   Alcohol use: Yes    Comment: 8oz of wine a day   Drug use: Never   Sexual activity: Not on file  Other Topics Concern   Not on file  Social History Narrative   Not on file   Social Determinants of Health   Financial Resource Strain: Low Risk  (12/22/2021)   Overall Financial Resource Strain (CARDIA)    Difficulty of Paying Living Expenses: Not hard at all  Food Insecurity: No Food Insecurity (12/22/2021)   Hunger Vital Sign    Worried About Running Out of Food in the Last Year: Never true    Ran Out of Food in the Last Year: Never true  Transportation Needs: No Transportation Needs (12/22/2021)   PRAPARE - Hydrologist (Medical): No    Lack of Transportation (Non-Medical): No  Physical Activity: Inactive (12/22/2021)   Exercise Vital Sign    Days of Exercise per Week: 0 days    Minutes of Exercise per Session: 0 min  Stress: No Stress Concern Present (12/22/2021)   [place]    Feeling of Stress : Not at all  Social Connections: Moderately Integrated (12/22/2021)   Social Connection and Isolation Panel [NHANES]    Frequency of Communication with Friends and Family: More than three times a week    Frequency of Social Gatherings with Friends and Family: More than three times a week    Attends Religious Services: More than 4 times per year    Active Member of [employer] or Organizations: No    Attends Archivist Meetings: Never    Marital Status: Married  Human resources officer Violence: Not At Risk (12/22/2021)   Humiliation, Afraid, Rape, and Kick questionnaire    Fear of Current or  Ex-Partner: No    Emotionally Abused: No    Physically Abused: No    Sexually Abused: No    PHYSICAL EXAM  GENERAL EXAM/CONSTITUTIONAL: Vitals:  Vitals:   03/01/22 1114  BP: 117/71  Pulse: (!) 101  Weight: 205 lb (93 kg)  Height: '5\' 11"'$  (1.803 m)    Body mass index is 28.59 kg/m. Wt Readings from Last 3 Encounters:  03/01/22 205 lb (93 kg)  01/20/22 208 lb 6.4 oz (94.5 kg)  01/07/22 206 lb (93.4 kg)   Patient is in no distress; well developed, nourished and groomed; neck is supple, has to stop mid sentence to take a breath  EYES: Pupils round and reactive to light, Visual fields full to confrontation, Extraocular movements intacts,   MUSCULOSKELETAL: Gait, strength, tone, movements noted in Neurologic exam below  NEUROLOGIC: MENTAL STATUS:      No data to display         awake, alert, oriented to person, place and time recent and remote memory intact normal attention and concentration language fluent, comprehension intact, naming intact fund of knowledge appropriate  CRANIAL NERVE:  2nd, 3rd, 4th, 6th - visual fields full to confrontation, extraocular muscles intact, no nystagmus 5th - facial sensation symmetric 7th - facial strength symmetric 8th - hearing intact 9th - palate elevates symmetrically, uvula midline 11th - shoulder shrug symmetric 12th - tongue protrusion midline  MOTOR:  normal bulk and tone, full  strength in the BUE, BLE  SENSORY:  Decrease light touch, pinprick and vibration all the way to ankle bilaterally. He also has absent proprioception.   COORDINATION:  finger-nose-finger, fine finger movements normal   GAIT   Ambulates with a cane    DIAGNOSTIC DATA (LABS, IMAGING, TESTING) - I reviewed patient records, labs, notes, testing and imaging myself where available.  Lab Results  Component Value Date   WBC 7.4 09/02/2021   HGB 14.9 09/02/2021   HCT 42.4 09/02/2021   MCV 96.8 09/02/2021   PLT 278 09/02/2021       Component Value Date/Time   NA 138 12/30/2021 0944   NA 137 03/05/2020 1343   K 4.2 12/30/2021 0944   CL 104 12/30/2021 0944   CO2 28 12/30/2021 0944   GLUCOSE 81 12/30/2021 0944   BUN 13 12/30/2021 0944   BUN 14 03/05/2020 1343   CREATININE 0.84 12/30/2021 0944   CREATININE 0.92 09/02/2021 1541   CALCIUM 9.2 12/30/2021 0944   PROT 7.1 12/30/2021 0944   PROT 6.9 04/20/2019 0807   ALBUMIN 3.9 12/30/2021 0944   ALBUMIN 4.3 04/20/2019 0807   AST 15 12/30/2021 0944   ALT 19 12/30/2021 0944   ALKPHOS 81 12/30/2021 0944   BILITOT 0.5 12/30/2021 0944   BILITOT 0.5 04/20/2019 0807   GFRNONAA >60 08/28/2021 0443   GFRAA 81 03/05/2020 1343   Lab Results  Component Value Date   CHOL 144 12/30/2021   HDL 25.00 (L) 12/30/2021   LDLCALC 58 04/03/2020   LDLDIRECT 80.0 12/30/2021   TRIG 342.0 (H) 12/30/2021   CHOLHDL 6 12/30/2021   Lab Results  Component Value Date   HGBA1C 5.9 12/30/2021   Lab Results  Component Value Date   RAQTMAUQ33 354 09/02/2021   Lab Results  Component Value Date   TSH 1.232 01/16/2021    Head CT 08/26/21:  1. No CT evidence for acute intracranial abnormality. 2. Atrophy and chronic small vessel ischemic changes of the white matter  MRI Brain 08/25/21 1. No acute intracranial abnormality.  2. Findings of chronic small vessel ischemia and generalized cerebral volume loss  Lumbar spine MRI 12/11/21 1.  At L2-L3, there is severe loss of disc height and other degenerative change causing moderately severe right lateral recess stenosis and moderate right foraminal narrowing.  There is potential for right L3 nerve root compression.  No spinal stenosis. 2.  At L3-L4, there is severe loss of disc height and other degenerative change causing moderately severe left lateral recess stenosis with potential for left L4 nerve root compression. 3.  At L4-L5, there are degenerative changes including severe facet hypertrophy causing mild spinal stenosis in the transverse  diameter and moderately severe left foraminal and left lateral recess stenosis.  There is potential for left L4 or L5 nerve root compression. 4.  Milder degenerative changes at L1-L2 and L5-S1 do not lead to spinal stenosis or nerve root compression.   LTM EEG  This study is within normal limits. 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 year old male with multiple medical conditions including hypertension, hyperlipidemia, atrial fibrillation, COPD, pulmonary fibrosis who is presenting for follow up for his neuropathic pain.  Pregabalin has been very helpful in controlling his pain but he developed weight gain which compromised his respiratory status (pulmonary fibrosis).  Pregabalin discontinued, patient put on amitriptyline  but it was not helpful to control his pain.  At this time we will plan to try him on lidocaine cream and also gave him Ambien to help with his sleep but if the lidocaine cream is not helpful then we will rediscuss plan to get a spinal cord stimulator for neuropathy.  At the moment he is not interested in any procedure but we will revisit this again if the lidocaine cream is not helpful to control the pain.  I will see him in 3 months for follow-up.  Patient understands to contact me sooner if the pain is not controlled.    1. Peripheral polyneuropathy   2. Small fiber neuropathy   3. Mild cognitive impairment      Patient Instructions  Discontinue amitriptyline Discontinue Lyrica Trial of lidocaine cream Trial of Ambien to help with sleep Contact me in about a month if the lidocaine cream is not helpful, at that time we will consider additional medications versus spinal cord stimulator Follow-up in 3 months or sooner if worse    No orders of the defined types were placed in this  encounter.   Meds ordered this encounter  Medications   zolpidem (AMBIEN CR) 6.25 MG CR tablet    Sig: Take 1 tablet (6.25 mg total) by mouth at bedtime as needed for sleep.    Dispense:  30 tablet    Refill:  0   Lidocaine 4 % LOTN    Sig: Use as needed in the bilateral lower extremities    Dispense:  88 mL    Refill:  0    Return in about 3 months (around 05/31/2022).  I have spent a total of 30 minutes dedicated to this patient today, preparing to see patient, performing a medically appropriate examination and evaluation, ordering tests and/or medications and procedures, and counseling and educating the patient/family/caregiver; independently interpreting result and communicating results to the family/patient/caregiver; and documenting clinical information in the electronic medical record.   Alric Ran, MD 03/01/2022, 12:51 PM  [facility] [address] [place], [place] [address] [phone]

## 2022-03-02 ENCOUNTER — Encounter: Payer: Self-pay | Admitting: Neurology

## 2022-03-04 ENCOUNTER — Telehealth: Payer: Self-pay | Admitting: Neurology

## 2022-03-04 NOTE — Telephone Encounter (Signed)
(  Key: [id number])  Your information has been submitted to [employer]. [employer] [place] will review the request and notify you of the determination decision directly, typically within 3 business days of your submission and once all necessary information is received.  You will also receive your request decision electronically. To check for an update later, open the request again from your dashboard.  If [employer] [place] has not responded within the specified timeframe or if you have any questions about your PA submission, contact [place] directly at [facility]) [phone] or ([place]) [phone].  PA for Zolpidem sent.

## 2022-03-04 NOTE — Telephone Encounter (Signed)
Pt is calling. Stated he needs to talk to nurse about getting a prescription from the pharmacy. Pt didn't know the name of medication. Pt is requesting a call back.

## 2022-03-08 MED ORDER — ZOLPIDEM TARTRATE 5 MG PO TABS: 5.0000 mg | ORAL_TABLET | Freq: Every evening | ORAL | 0 refills | Status: DC | PRN

## 2022-03-08 NOTE — Addendum Note (Signed)
Addended by: Verlin Grills on: 03/08/2022 10:51 AM   Modules accepted: Orders

## 2022-03-08 NOTE — Telephone Encounter (Signed)
Ok to send the new prescription, regular release. Thanks

## 2022-03-08 NOTE — Telephone Encounter (Signed)
Pt updated on med change via mychart.

## 2022-03-08 NOTE — Telephone Encounter (Signed)
PA for Zolpidem CR has been denied. Insurance is requesting the regular release med be sent ( this is a covered option for the pt). CR is not on the formulary.

## 2022-03-08 NOTE — Telephone Encounter (Signed)
Completed.    Thanks

## 2022-03-08 NOTE — Addendum Note (Signed)
Addended byAlric Ran on: 03/08/2022 10:55 AM   Modules accepted: Orders

## 2022-03-13 ENCOUNTER — Other Ambulatory Visit: Payer: Self-pay | Admitting: Cardiology

## 2022-03-13 DIAGNOSIS — I480 Paroxysmal atrial fibrillation: Secondary | ICD-10-CM

## 2022-03-16 NOTE — Telephone Encounter (Signed)
Prescription refill request for Xarelto received.  Indication: Afib  Last office visit: 01/20/22 Robert Lang)  Weight: 93kg Age: 71 Scr: 0.84 (12/30/21)  CrCl: 106.1kg  Appropriate dose and refill sent to requested pharmacy.

## 2022-04-12 ENCOUNTER — Encounter: Payer: Self-pay | Admitting: Neurology

## 2022-04-12 ENCOUNTER — Other Ambulatory Visit: Payer: Self-pay | Admitting: Neurology

## 2022-04-12 MED ORDER — ZOLPIDEM TARTRATE 5 MG PO TABS: 5.0000 mg | ORAL_TABLET | Freq: Every evening | ORAL | 5 refills | Status: DC | PRN

## 2022-04-12 NOTE — Telephone Encounter (Signed)
Yes, let s send him refills. Thanks

## 2022-04-15 DIAGNOSIS — H31011 Macula scars of posterior pole (postinflammatory) (post-traumatic), right eye: Secondary | ICD-10-CM | POA: Diagnosis not present

## 2022-04-16 ENCOUNTER — Ambulatory Visit: Payer: Medicare Other | Admitting: Medical

## 2022-04-20 ENCOUNTER — Other Ambulatory Visit: Payer: Self-pay | Admitting: Internal Medicine

## 2022-04-20 ENCOUNTER — Ambulatory Visit: Payer: Medicare Other | Admitting: Internal Medicine

## 2022-04-20 ENCOUNTER — Ambulatory Visit (HOSPITAL_BASED_OUTPATIENT_CLINIC_OR_DEPARTMENT_OTHER)
Admission: RE | Admit: 2022-04-20 | Discharge: 2022-04-20 | Disposition: A | Discharge status: Home / Self Care | Payer: Self-pay | Source: Ambulatory Visit | Attending: Internal Medicine | Admitting: Internal Medicine

## 2022-04-20 ENCOUNTER — Encounter: Payer: Self-pay | Admitting: Internal Medicine

## 2022-04-20 ENCOUNTER — Encounter: Payer: Medicare Other | Admitting: Internal Medicine

## 2022-04-20 ENCOUNTER — Ambulatory Visit (INDEPENDENT_AMBULATORY_CARE_PROVIDER_SITE_OTHER): Payer: Medicare Other | Admitting: Internal Medicine

## 2022-04-20 VITALS — BP 92/60 | HR 105 | Ht 71.0 in | Wt 200.0 lb

## 2022-04-20 DIAGNOSIS — R5383 Other fatigue: Secondary | ICD-10-CM | POA: Diagnosis not present

## 2022-04-20 DIAGNOSIS — J84112 Idiopathic pulmonary fibrosis: Secondary | ICD-10-CM | POA: Diagnosis not present

## 2022-04-20 DIAGNOSIS — M5136 Other intervertebral disc degeneration, lumbar region: Secondary | ICD-10-CM | POA: Diagnosis not present

## 2022-04-20 DIAGNOSIS — M2578 Osteophyte, vertebrae: Secondary | ICD-10-CM | POA: Diagnosis not present

## 2022-04-20 DIAGNOSIS — Z006 Encounter for examination for normal comparison and control in clinical research program: Secondary | ICD-10-CM

## 2022-04-20 DIAGNOSIS — R9431 Abnormal electrocardiogram [ECG] [EKG]: Secondary | ICD-10-CM

## 2022-04-20 DIAGNOSIS — M48061 Spinal stenosis, lumbar region without neurogenic claudication: Secondary | ICD-10-CM | POA: Diagnosis not present

## 2022-04-20 DIAGNOSIS — M85851 Other specified disorders of bone density and structure, right thigh: Secondary | ICD-10-CM | POA: Diagnosis not present

## 2022-04-20 DIAGNOSIS — Z5181 Encounter for therapeutic drug level monitoring: Secondary | ICD-10-CM

## 2022-04-20 LAB — CBC WITH DIFFERENTIAL/PLATELET
Basophils Absolute: 0 10*3/uL (ref 0.0–0.1)
Basophils Relative: 0.4 % (ref 0.0–3.0)
Eosinophils Absolute: 0.1 10*3/uL (ref 0.0–0.7)
Eosinophils Relative: 1.1 % (ref 0.0–5.0)
HCT: 47.4 % (ref 39.0–52.0)
Hemoglobin: 16.2 g/dL (ref 13.0–17.0)
Lymphocytes Relative: 30 % (ref 12.0–46.0)
Lymphs Abs: 2.9 10*3/uL (ref 0.7–4.0)
MCHC: 34.1 g/dL (ref 30.0–36.0)
MCV: 96.1 fl (ref 78.0–100.0)
Monocytes Absolute: 1.1 10*3/uL — ABNORMAL HIGH (ref 0.1–1.0)
Monocytes Relative: 11.8 % (ref 3.0–12.0)
Neutro Abs: 5.4 10*3/uL (ref 1.4–7.7)
Neutrophils Relative %: 56.7 % (ref 43.0–77.0)
Platelets: 268 10*3/uL (ref 150.0–400.0)
RBC: 4.94 Mil/uL (ref 4.22–5.81)
RDW: 14.2 % (ref 11.5–15.5)
WBC: 9.5 10*3/uL (ref 4.0–10.5)

## 2022-04-20 LAB — BASIC METABOLIC PANEL
BUN: 17 mg/dL (ref 6–23)
CO2: 25 mEq/L (ref 19–32)
Calcium: 9.7 mg/dL (ref 8.4–10.5)
Chloride: 100 mEq/L (ref 96–112)
Creatinine, Ser: 0.95 mg/dL (ref 0.40–1.50)
GFR: 80.21 mL/min (ref >60.00)
Glucose, Bld: 149 mg/dL — ABNORMAL HIGH (ref 70–99)
Potassium: 4.4 mEq/L (ref 3.5–5.1)
Sodium: 134 mEq/L — ABNORMAL LOW (ref 135–145)

## 2022-04-20 LAB — PULMONARY FUNCTION TEST
DL/VA % pred: 74 %
DL/VA: 2.99 ml/min/mmHg/L
DLCO cor % pred: 64 %
DLCO cor: 17.01 ml/min/mmHg
DLCO unc % pred: 64 %
DLCO unc: 17.01 ml/min/mmHg
FEF 25-75 Pre: 1.76 L/sec
FEF2575-%Pred-Pre: 71 %
FEV1-%Pred-Pre: 70 %
FEV1-Pre: 2.34 L
FEV1FVC-%Pred-Pre: 102 %
FEV6-%Pred-Pre: 72 %
FEV6-Pre: 3.07 L
FEV6FVC-%Pred-Pre: 104 %
FVC-%Pred-Pre: 68 %
FVC-Pre: 3.11 L
Pre FEV1/FVC ratio: 75 %
Pre FEV6/FVC Ratio: 99 %

## 2022-04-20 LAB — HEPATIC FUNCTION PANEL
ALT: 22 U/L (ref 0–53)
AST: 18 U/L (ref 0–37)
Albumin: 4.2 g/dL (ref 3.5–5.2)
Alkaline Phosphatase: 81 U/L (ref 39–117)
Bilirubin, Direct: 0.1 mg/dL (ref 0.0–0.3)
Total Bilirubin: 0.4 mg/dL (ref 0.2–1.2)
Total Protein: 7.9 g/dL (ref 6.0–8.3)

## 2022-04-20 NOTE — Progress Notes (Signed)
Spirometry and DLCO Performed Today.  

## 2022-04-20 NOTE — Progress Notes (Signed)
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 light therapy for his anxiety and depression.  Of note: Wife is interested in him participating in clinical trials.  Last visit I gave informed consent copy for [facility].  But since then the been more adverse events with GI side effects.  Therefore I advised for him not to participate in the clinical trial.  We discussed beacon IPF study by PLIANT agent called BEXOTEGRAST with promising phase 2 data and safety profile.  A 1 year study.  I gave him the consent form.  In review of his medical information there is a history of  syncope.  Therefore his EKG that I personally reviewed multiple EKGs in the past show QTc greater than 450 ms.  Therefore got a standard of care EKG today and his QTc F is 400 ms.  I have sent a message to Dr. K hRaliegh Ips cardiologist.   Also discussed [employer] bone study.  As a DEXA bone scan for patients with IPF.  He is interested in this.  Him consent form.  Prequalifies.  Formal screening will be done under the auspices of the research protocol.                SYMPTOM SCALE - ILD 06/17/2020   07/28/2020   09/09/2020 184# 11/18/2020 185#  Esbriet '801mg'$  BID 12/19/2020   Esbriet '801mg'$  TID  02/19/2021 187# 06/23/2021 180# 07/14/2021 Esbriet '801mg'$  TID  09/24/2021 188# - esbreit and also stiolto 01/07/2022 206# 04/20/2022 200# esbriet  O2 use ra RA     RA   ra0 RA  ra ra ra  Shortness of Breath 0 -> 5 scale with 5 being worst (score 6 If unable to do) 0-5             At rest 3 0.5 2 4  3  0 '3 2 3 3  '$ Simple tasks - showers, clothes change, eating, shaving '5 3 3 3 2  3 3 4 4 2  '$ Household (dishes, doing bed, laundry) 5 3 (he does not do these tasks) 5 0 3 (he does not do these tasks)  0 NA '4 5 2  '$ Shopping 5 3 (he does not do this often) '3 1 3 '$ (he does not do these tasks)  4 3.5 (uses scooter) '4 5 2  '$ Walking level at own pace '4 3 3 1 3  5 3 3 3 2  '$ Walking up Stairs '5 5 4 1 3  5 '$ 3.'5 4 5 2  '$ Total (30-36) Dyspnea Score 27 17.'5 20 10 17  17 16 21 25 13  '$ How bad is your cough? '3 2 1 '$ fair 1  0 '1 2 2 2  '$ How bad is your fatigue '4 2 4 '$ fair '3  3 4 3 5 2  '$ How bad is nausea 0 0 3 good '2  3 2 2 2 '$ 0  How bad is vomiting?   0 0 0 good 0  0 0 0 0 0  How bad is diarrhea? 3 0 0 good 0  0 0 1 2 0  How bad is anxiety? '5 5 3 '$ fair '2  5 5 3 5 3  '$ How bad is depression '5 5 3 '$ faor '3  3 4 3 4 3         '$ Simple office walk 185 feet x  3 laps goal with forehead probe 06/23/2021  09/24/2021  01/07/2022   O2 used ra ra ra  Number laps completed 2 of 3 Did all 3 withe cane Did all 3 with cane  Comments about pace Not done  Slow pace with cane Slow pace - stopped 2-3 times in fist lap  Resting Pulse Ox/HR 98% and 97/min 99% and HR 102 99% and HR 86  Final Pulse Ox/HR 97% and 101/min 99% and HR 112 98% and HR 100  Desaturated </= 88% no no no  Desaturated <= 3% points no no no  Got Tachycardic >/= 90/min yes Ye even at rest yes  Symptoms at end of test Not rcorded Modrate dyspnea Moderate to severe dyspnea  Miscellaneous comments x       PFT     Latest Ref Rng & Units 04/20/2022    9:18 AM 01/07/2022    8:30 AM 06/23/2021    9:23 AM 02/19/2021   11:47 AM 11/18/2020    9:04 AM 05/09/2020    8:46 AM  PFT Results  FVC-Pre L 3.11  P 3.05  3.48  3.49  3.33  3.21   FVC-Predicted Pre % 68  P 66  76  75  72  69   FVC-Post L      3.38   FVC-Predicted Post %      73   Pre FEV1/FVC % % 75  P 73  76  73  79  81   Post FEV1/FCV % %      76   FEV1-Pre L 2.34  P 2.21  2.64  2.56  2.63  2.60   FEV1-Predicted Pre % 70  P 66  79  75  77  76   FEV1-Post L      2.55   DLCO uncorrected ml/min/mmHg 17.01  P 16.62  17.94  20.29  19.95  22.07   DLCO UNC% % 64  P 62  67  75  74  82   DLCO corrected ml/min/mmHg 17.01  P 16.62  17.94  20.29  19.95  21.66   DLCO COR %Predicted % 64  P 62  67  75  74  81   DLVA Predicted % 74  P 74  79  81  79  100   TLC L      7.61   TLC % Predicted %      105   RV % Predicted %      139     P Preliminary result       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), Allergic rhinitis due to pollen (09/03/2013), Allergy, Anticoagulated (07/01/2016), Anxiety, Arthritis, Atrial fibrillation ([place]), Atrial flutter ([place]) (06/26/2015), CAD (coronary artery disease), Chest pain (06/26/2015), COPD (chronic obstructive pulmonary disease) ([place]), Coronary artery disease (07/06/2018), DDD (degenerative disc disease), cervical (09/03/2013), Depression, Depression, Depressive disorder  (09/03/2013), Dizziness (10/28/2017), Dyslipidemia, goal LDL below 70 (07/06/2018), Dyspnea on exertion (10/20/2018), Esophageal reflux (09/03/2013), Essential hypertension (07/06/2018), ETOH abuse (01/11/2019), Falls (10/28/2017), GERD (gastroesophageal reflux disease), H/O amiodarone therapy (07/01/2016), Heart attack ([place]), Heart palpitations (01/27/2017), History of colon polyps, Hyperlipidemia, Hypertension, IPF (idiopathic pulmonary fibrosis) ([place]), Kidney stones, Neck pain (09/03/2013), Neuropathy (07/06/2018), Obstructive sleep apnea (08/05/2015), PLMD (periodic limb movement disorder) (11/04/2015), Polycythemia, 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   reports that he quit smoking about 3 years ago. His smoking use included cigarettes. He has a 30.00 pack-year smoking history. He has quit using smokeless tobacco.  Past Surgical History:  Procedure Laterality Date   ATRIAL FIBRILLATION ABLATION  10/2015   BACK SURGERY     CARDIOVERSION  2017   CATARACT EXTRACTION Bilateral 2017   March and April 2017   COLONOSCOPY  2018   CORONARY STENT PLACEMENT  2012   CORONARY/GRAFT ACUTE MI REVASCULARIZATION N/A 01/10/2019   Procedure: CORONARY/GRAFT ACUTE MI REVASCULARIZATION;  Surgeon: Leonie Man, MD;  Location: [facility];  Service: Cardiovascular;  Laterality: N/A;   INGUINAL HERNIA REPAIR     over 20 years ago   LAPAROSCOPIC APPENDECTOMY N/A 01/17/2021   Procedure: APPENDECTOMY LAPAROSCOPIC;  Surgeon: Felicie Morn, MD;  Location: [facility];  Service: General;  Laterality: N/A;   LEFT HEART CATH AND CORONARY ANGIOGRAPHY N/A 01/10/2019   Procedure: LEFT HEART CATH AND CORONARY ANGIOGRAPHY;  Surgeon: Leonie Man, MD;  Location: [facility];  Service: Cardiovascular;  Laterality: N/A;   LUMBAR  LAMINECTOMY Bilateral 04/05/2016   L2-L5    VENTRAL HERNIA REPAIR  2018    Allergies  Allergen Reactions   Naproxen Other (See Comments)    (Naprosyn *ANALGESICS - ANTI-INFLAMMATORY*) Nausea, Abdominal pain   Silodosin Other (See Comments)    Low blood pressure    Immunization History  Administered Date(s) Administered   Influenza Split 01/09/2010, 02/09/2011   Influenza, High Dose Seasonal PF 11/15/2018, 01/11/2020, 12/27/2020, 12/24/2021   Influenza,inj,Quad PF,6+ Mos 01/01/2015   Influenza-Unspecified 01/30/2014, 02/02/2016, 12/15/2020   PFIZER(Purple Top)SARS-COV-2 Vaccination 04/27/2019, 05/18/2019, 01/14/2020, 07/22/2020, 12/15/2020, 12/24/2021   Pneumococcal Conjugate-13 11/23/2018   Pneumococcal Polysaccharide-23 08/12/2020   Respiratory Syncytial Virus Vaccine,Recomb Aduvanted(Arexvy) 12/17/2021   Tdap 02/18/2011    Family History  Problem Relation Age of Onset   Anxiety disorder Mother    Alcohol abuse Father    Colon  cancer Father    Depression Brother    Alcohol abuse Brother    Throat cancer Brother      Current Outpatient Medications:    atorvastatin (LIPITOR) 80 MG tablet, Take 1 tablet (80 mg total) by mouth at bedtime., Disp: 90 tablet, Rfl: 3   buPROPion (WELLBUTRIN XL) 300 MG 24 hr tablet, Take 1 tablet (300 mg total) by mouth daily., Disp: 90 tablet, Rfl: 0   busPIRone (BUSPAR) 15 MG tablet, Take 1 tablet (15 mg total) by mouth 2 (two) times daily., Disp: 180 tablet, Rfl: 0   Cholecalciferol 25 MCG (1000 UT) tablet, Take 1,000 Units by mouth daily. , Disp: , Rfl:    docusate sodium (COLACE) 100 MG capsule, Take 100 mg by mouth daily., Disp: , Rfl:    famotidine (PEPCID) 40 MG tablet, TAKE 1 TABLET(40 MG) BY MOUTH AT BEDTIME, Disp: 90 tablet, Rfl: 2   fexofenadine (ALLEGRA) 180 MG tablet, Take 180 mg by mouth at bedtime. , Disp: , Rfl:    finasteride (PROSCAR) 5 MG tablet, Take 1 tablet (5 mg total) by mouth daily., Disp: 90 tablet, Rfl: 1   Ginger,  Zingiber officinalis, (GINGER ROOT) 550 MG CAPS, Take 550 mg by mouth daily., Disp: , Rfl:    Lidocaine 4 % LOTN, Use as needed in the bilateral lower extremities, Disp: 88 mL, Rfl: 0   melatonin 5 MG TABS, Take 10 mg by mouth at bedtime., Disp: , Rfl:    Multiple Vitamins-Minerals (PRESERVISION AREDS PO), Take 1 capsule by mouth in the morning and at bedtime., Disp: , Rfl:    nitroGLYCERIN (NITROSTAT) 0.4 MG SL tablet, Place 1 tablet (0.4 mg total) under the tongue every 5 (five) minutes as needed for chest pain., Disp: 25 tablet, Rfl: 3   pantoprazole (PROTONIX) 40 MG tablet, Take 1 tablet (40 mg total) by mouth daily., Disp: 90 tablet, Rfl: 3   Pirfenidone (ESBRIET) 801 MG TABS, Take 801 mg by mouth 3 (three) times daily., Disp: 270 tablet, Rfl: 1   Polyethylene Glycol 3350 (MIRALAX PO), Take 17 g by mouth daily., Disp: , Rfl:    ranolazine (RANEXA) 1000 MG SR tablet, Take 1 tablet (1,000 mg total) by mouth 2 (two) times daily., Disp: 180 tablet, Rfl: 2   sertraline (ZOLOFT) 100 MG tablet, Take 1 tablet (100 mg total) by mouth at bedtime., Disp: 90 tablet, Rfl: 0   traZODone (DESYREL) 50 MG tablet, Take 1 tablet (50 mg total) by mouth at bedtime as needed for sleep (May take 1/2 tab to 1 tablet for sleep, as needed.)., Disp: 90 tablet, Rfl: 0   vitamin B-12 (CYANOCOBALAMIN) 1000 MCG tablet, Take 1,000 mcg by mouth daily., Disp: , Rfl:    XARELTO 20 MG TABS tablet, TAKE 1 TABLET BY MOUTH DAILY WITH SUPPER, Disp: 30 tablet, Rfl: 5   zolpidem (AMBIEN) 5 MG tablet, Take 1 tablet (5 mg total) by mouth at bedtime as needed for sleep., Disp: 30 tablet, Rfl: 5      Objective:   Vitals:   04/20/22 1015  BP: 92/60  Pulse: (!) 105  SpO2: 98%  Weight: 200 lb (90.7 kg)  Height: '5\' 11"'$  (1.803 m)    Estimated body mass index is 27.89 kg/m as calculated from the following:   Height as of this encounter: '5\' 11"'$  (1.803 m).   Weight as of this encounter: 200 lb (90.7 kg).  '@WEIGHTCHANGE'$ @  [facility]   04/20/22 1015  Weight: 200 lb (90.7 kg)  Physical Exam=   General: No distress. Looks well but depressed and deconditioned. HAs CANE Neuro: Alert and Oriented x 3. GCS 15. Speech normal Psych: Pleasant Resp:  Barrel Chest - no.  Wheeze - no, Crackles - YES BASE. , No overt respiratory distress CVS: Normal heart sounds. Murmurs - no Ext: Stigmata of Connective Tissue Disease - NO HEENT: Normal upper airway. PEERL +. No post nasal drip        Assessment:       ICD-10-CM   1. IPF (idiopathic pulmonary fibrosis) ([place])  J84.112     2. Medication monitoring encounter  Z51.81     3. Other fatigue  R53.83          Plan:     Patient Instructions  IPF (idiopathic pulmonary fibrosis) ([place]) Medication monitoring encounter  -Pulmonary fibrosis itself appears to be stable since December 2022 based on pulmonary function test and symptoms..  Overall tolerating Esbriet well.  Appreciated interested in clinical trials.  plan - check cbc, bmet,  LFT  04/20/2022 - continue esbriet per schedule -Take informed consent for beacon IPF study  -If you prequalify then we will try to schedule you in the next few months -Take informed consent form for [employer] bone study  -If you requalify then we will him to recruit you today - -Recheck spirometry and DLCO in 4 months   Fatigue/Anxiety/Depression  -Your fatigue could be because of pirfenidone.  It could also be because you are not using a CPAP.  It can also be because of many of the anxiety and depression and pain medications you are taking.  I do not think the fatigue is because of pulmonary fibrosis in itself.  Plan  - Talk to primary care physician  - Consider white light therapy every day for depression - Try to be more compliant with the CPAP   -Follow-up - 4 months Dr Chase Caller but after PFT; symptom score and walk at followup  -30-minute visit  ( Level 05 visit: Estb 40-54 min   visit type: on-site physical  face to visit  in total care time and counseling or/and coordination of care by this undersigned MD - Dr Brand Males. This includes one or more of the following on this same day 04/20/2022: pre-charting, chart review, note writing, documentation discussion of test results, diagnostic or treatment recommendations, prognosis, risks and benefits of management options, instructions, education, compliance or risk-factor reduction. It excludes time spent by the [place] or office staff in the care of the patient. Actual time 67 min)   SIGNATURE    Dr. Brand Males, M.D., F.C.C.P,  Pulmonary and Critical Care Medicine Staff Physician, [place] Director - Interstitial Lung Disease  Program  Pulmonary [place] [facility] [place], [place], [address]  Pager: [phone], If no answer or between  15:00h - 7:00h: call [phone] Telephone: [phone]  11:08 AM 04/20/2022

## 2022-04-20 NOTE — Telephone Encounter (Signed)
Hi Dr Robert Lang  I saw Robert Lang -he expressed interest in clinical trials for pulmonary fibrosis.  Typically for clinical trials QTc F greater than 450 ms there is exclusion criteria.  When I reviewed his standard of care EKGs multiple QT intervals were prolonged.  So I got a standard of care twelve-lead EKG today.  His heart rate was 106/min.  His QT was '4 1 4 '$ ms.  This took his QTcF tp 500 ms.   He is on a lot of antidepressant and antianxiety medications.   Just wanted to bring this to your attention in case this interval is concerning for you      SIGNATURE    Dr. Brand Males, M.D., F.C.C.P,  Pulmonary and Critical Care Medicine Staff Physician, [place] Director - Interstitial Lung Disease  Program  Medical Director - [facility] [place] [facility] [place], [place], [address]   Pager: [phone], If no answer  -[place] or [place] [phone] Telephone (clinical office): [phone] Telephone (research): [phone]  11:36 AM 04/20/2022

## 2022-04-20 NOTE — Patient Instructions (Addendum)
IPF (idiopathic pulmonary fibrosis) ([place]) Medication monitoring encounter  -Pulmonary fibrosis itself appears to be stable since December 2022 based on pulmonary function test and symptoms..  Overall tolerating Esbriet well.  Appreciated interested in clinical trials.  plan - check cbc, bmet,  LFT  04/20/2022 - continue esbriet per schedule -Take informed consent for beacon IPF study  -If you prequalify then we will try to schedule you in the next few months -Take informed consent form for [employer] bone study  -If you requalify then we will him to recruit you today - -Recheck spirometry and DLCO in 4 months   Fatigue/Anxiety/Depression  -Your fatigue could be because of pirfenidone.  It could also be because you are not using a CPAP.  It can also be because of many of the anxiety and depression and pain medications you are taking.  I do not think the fatigue is because of pulmonary fibrosis in itself.  Plan  - Talk to primary care physician  - Consider white light therapy every day for depression - Try to be more compliant with the CPAP  QT interval on EKG > 448mec in past  EKG 04/20/2022 - QtcF 5065mc  Plan  - excludes you from may research studies  - have informed Dr K Raliegh Ipour cardiologist   -Follow-up - 4 months Dr RaChase Callerut after PFT; symptom score and walk at followup  -30-minute visit

## 2022-04-20 NOTE — Research (Signed)
[place]:  An Exploratory Assessment of Bone Disease in Idiopathic Pulmonary Fibrosis    Primary Endpoint  The primary objectives of this exploratory study are to broadly estimate: the prevalence and severity of osteopenia and osteoporosis in those with IPF, and the prevalence of vertebral fragility fracture(s) in those with IPF.  Study Code: [id number] Protocol Edition: Number 2.0, Date 18 November 2021 Sponsor Name: [employer] Primary Investigator: Dr. Brand Males  Protocol Version for 04/20/2022 date of 18 Nov 2021 Consent Version for 04/20/2022 date of 10 Feb 2022  Key Feature of Study: This is an exploratory study to broadly assess bone disease in those with IPF. Participants are expected to attend one visit consisting of informed consent, medical history, lab blood draw, lateral thoracic and lumbar x-rays, and a DXA bone density scan. X-rays and DXA scan may be done on the same day as screening visit or within 7 days after screening. Study participation is concluded once visit, labs, and radiologic studies completed.  Key Inclusion Criteria:   1. Male or male participants aged 64 years or older with mild-to-moderate IPF (defined as FVC >= 45% predicted at most recent measurement). 2.. Negative pregnancy test (high sensitivity urine pregnancy test) for male participants prior to imaging.  Key Exclusion Criteria: 1. Non-IPF interstitial lung disease. 2. (FEV1))/FVC ratio <0.70. 3. Active enrollment or recent participation (ie, within 3 months or less than 5 half-lives of investigational medical product whichever is longer) in an interventional clinical trial where participants received an investigational medical product or placebo. 4. Current systemic corticosteroid treatment. 5. Prior history of hip, thoracic spine, or lumbar spine surgery if residual hardware remains within these areas. 6. Prior history of single or bilateral lung transplantation. 7. Current or prior history  of a hematologic malignancy or metastatic cancer. 8. Nuclear medicine study within the past 1 week or an X-ray procedure using contrast within the past 72 hours. 9. Individuals who have had calcium supplements within 24 hours of the dual X-ray absorptiometry (DXA) scan. Note: Individuals using calcium supplements are not excluded if they temporarily discontinue their calcium 24 hours prior to their DXA scan. Calcium supplements may be resumed following their DXA scan. 7. Women who are pregnant.  Safety Data: Not Applicable.  No interventional product given.   [facility] Coordinator note:   This visit for Subject Robert Lang with DOB: May 12, 1950 on 04/20/2022 for the above protocol is Visit/Encounter # 1  and is for purpose of research.   The consent for this encounter is under Protocol Version 2.0, Informed Consent Version 1 and  is currently IRB approved.   Subject expressed continued interest and consent in continuing as a study subject. Subject confirmed that there was  no change in contact information (e.g. address, telephone, email). Subject thanked for participation in research and contribution to science. In this visit 04/20/2022 the subject will be evaluated by  Principal Investigator named Dr. Chase Caller. This research coordinator has verified that the above investigator is up to date with his/her training logs.   The Subject was informed that the PI  continues to have oversight of the subject's visits and course through relevant discussions, reviews, and also specifically of this visit by routing of this note to the PI.  This visit is a key visit of  screening,. The PI is  available for this visit.     Signed by  Darius Bump Pool  Clinical Research Coordinator  [facility]  [place], [place] 3:50 PM 04/20/2022

## 2022-04-20 NOTE — Patient Instructions (Signed)
Spirometry and DLCO Performed Today.  

## 2022-04-21 ENCOUNTER — Other Ambulatory Visit: Payer: Self-pay

## 2022-04-21 DIAGNOSIS — F411 Generalized anxiety disorder: Secondary | ICD-10-CM

## 2022-04-21 DIAGNOSIS — G4700 Insomnia, unspecified: Secondary | ICD-10-CM

## 2022-04-21 DIAGNOSIS — F32A Depression, unspecified: Secondary | ICD-10-CM

## 2022-04-21 MED ORDER — TRAZODONE HCL 50 MG PO TABS: 50.0000 mg | ORAL_TABLET | Freq: Every evening | ORAL | 0 refills | Status: DC | PRN

## 2022-04-21 MED ORDER — SERTRALINE HCL 100 MG PO TABS: 100.0000 mg | ORAL_TABLET | Freq: Every day | ORAL | 0 refills | Status: DC

## 2022-04-22 ENCOUNTER — Other Ambulatory Visit: Payer: Self-pay | Admitting: Internal Medicine

## 2022-04-22 ENCOUNTER — Other Ambulatory Visit (INDEPENDENT_AMBULATORY_CARE_PROVIDER_SITE_OTHER): Payer: Medicare Other

## 2022-04-22 ENCOUNTER — Encounter: Payer: Self-pay | Admitting: Internal Medicine

## 2022-04-22 ENCOUNTER — Ambulatory Visit: Payer: Medicare Other | Admitting: Internal Medicine

## 2022-04-22 VITALS — BP 128/78 | HR 98 | Ht 71.0 in | Wt 203.6 lb

## 2022-04-22 DIAGNOSIS — J84112 Idiopathic pulmonary fibrosis: Secondary | ICD-10-CM

## 2022-04-22 DIAGNOSIS — G4733 Obstructive sleep apnea (adult) (pediatric): Secondary | ICD-10-CM

## 2022-04-22 DIAGNOSIS — Z006 Encounter for examination for normal comparison and control in clinical research program: Secondary | ICD-10-CM

## 2022-04-22 LAB — ALKALINE PHOSPHATASE: Alkaline Phosphatase: 81 U/L (ref 39–117)

## 2022-04-22 LAB — CALCIUM: Calcium: 9.3 mg/dL (ref 8.4–10.5)

## 2022-04-22 LAB — VITAMIN D 25 HYDROXY (VIT D DEFICIENCY, FRACTURES): VITD: 23.16 ng/mL — ABNORMAL LOW (ref 30.00–100.00)

## 2022-04-22 LAB — PHOSPHORUS: Phosphorus: 3.5 mg/dL (ref 2.3–4.6)

## 2022-04-22 NOTE — Assessment & Plan Note (Signed)
He can't keep CPAP on- up frequently due to neuropathy.  Plan- Work on better sleep habits. Try increasing ambien to 10 mg for comparison.

## 2022-04-22 NOTE — Progress Notes (Signed)
HPI M former smoker(30 pk yrs) followed for OSA, complicated by CAD/ MI, AFIB, HTN, Allergic Rhinitis, COPD, ILD, Nocturnal Hypoxemia,Lung Nodules (Dr Chase Caller), Degenertive Disc Disease, ETOH, Hyperlipidemia, HST 06/10/20- AHI 31/ hr, saturation 80%-89%, body weight 194 lbs  ================================================================  05/21/21- 54 yoM former smoker(30 pk yrs) followed for OSA, complicated by CAD/ MI, AFIB, HTN, Allergic Rhinitis, COPD, ILD, Nocturnal Hypoxemia,Lung Nodules (Dr Chase Caller), Degenerative Disc Disease, ETOH, Hyperlipidemia, CPAP auto 5-20/ [facility]             AirSense 11 AutoSet Download- compliance 97%, AHI 1.4/ hr Body weight today-187 lbs Covid vax- 4 Phizer Flu- had He reports doing well with his CPAP.  Spring pollen causes mild increased nasal congestion and he is using a nasal mask but this does not seem limiting.  He is more concerned about approaching the hot humid weather which is hard for his breathing.  Does not have oxygen.  Self paid for a power scooter.  Pending follow-up with Dr. Chase Caller for general pulmonary. ED visit for abdominal pain and has pending appointment with GI.  Not anemic.  04/22/22- 80 yoM former smoker(30 pk yrs) followed for OSA, complicated by CAD/ MI, AFIB, HTN, Allergic Rhinitis, COPD, ILD, Nocturnal Hypoxemia,Lung Nodules (Dr Chase Caller), Degenerative Disc Disease, ETOH, Hyperlipidemia, CPAP auto 5-20/ [facility]             AirSense 11 AutoSet Download- compliance 0%, AHI 0.5/ hr    All short nights Body weight today- Covid vax- 4 Phizer Flu- had Wife here> He's up and down all night due to peripheral neuropathy. Neurologist treating- also started ambien 5 mg 2 months ago. We discussed alternatives to CPAP. He isn't interested in [place]. Discussed sleep medicines. He is going to try Voltaren oint on leg and occasionally increase ambien to 10 mg. I would consider Lunesta or clonazepam if needed- appropriate discussion done. Lying  in bed watching YouTube in evening. Discussed sleep hygiene.  ROS-see HPI   + = positive Constitutional:    weight loss, night sweats, fevers, chills, fatigue, lassitude. HEENT:    headaches, difficulty swallowing, tooth/dental problems, sore throat,       sneezing, itching, ear ache, nasal congestion, post nasal drip, snoring CV:    chest pain, orthopnea, PND, swelling in lower extremities, anasarca,                                   dizziness, palpitations Resp:   +shortness of breath with exertion or at rest.                productive cough,   non-productive cough, coughing up of blood.              change in color of mucus.  wheezing.   Skin:    rash or lesions. GI:  No-   heartburn, indigestion, abdominal pain, nausea, vomiting, diarrhea,                 change in bowel habits, loss of appetite GU: dysuria, change in color of urine, no urgency or frequency.   flank pain. MS:   joint pain, stiffness, decreased range of motion, back pain. Neuro-     + peripheral neuropathy Psych:  change in mood or affect.  depression or anxiety.   memory loss.  OBJ- Physical Exam    +overweight    Arrival room air O2 sat 96% General- Alert, Oriented, Affect-appropriate, Distress-  none acute Skin- rash-none, lesions- none, excoriation- none Lymphadenopathy- none Head- atraumatic            Eyes- Gross vision intact, PERRLA, conjunctivae and secretions clear            Ears- Hearing, canals-normal            Nose- Clear, no-Septal dev, mucus, polyps, erosion, perforation             Throat- Mallampati III , mucosa clear , drainage- none, tonsils- absent,  +teeth Neck- flexible , trachea midline, no stridor , thyroid nl, carotid no bruit Chest - symmetrical excursion , unlabored           Heart/CV- RRR , no murmur , no gallop  , no rub, nl s1 s2                           - JVD- none , edema- none, stasis changes- none, varices- none           Lung- +breathless during speech on room air. + basilar  crackles. wheeze- none, cough- none , dullness-none, rub- none           Chest wall-  Abd-  Br/ Gen/ Rectal- Not done, not indicated Extrem-+cane Neuro- +tremor R hand

## 2022-04-22 NOTE — Telephone Encounter (Signed)
Hi Dr Agustin Cree  Thanks for that. Did not know that. Appreciate the learning there. So what would his corrected QT be in the settting of bundle branch block. If < 489mec I can talk to the research sponsor   Thanks  MR

## 2022-04-22 NOTE — Patient Instructions (Signed)
Ok to try increasing ambien to 10 mg to see if it helps better. If you can sleep more soundly then maybe you can get more benefit out of the CPAP.

## 2022-04-22 NOTE — Assessment & Plan Note (Signed)
Continues working with Dr Chase Caller

## 2022-04-26 ENCOUNTER — Ambulatory Visit: Payer: Medicare Other | Admitting: Behavioral Health

## 2022-04-26 ENCOUNTER — Ambulatory Visit: Payer: Self-pay | Admitting: Nurse Practitioner

## 2022-04-28 ENCOUNTER — Other Ambulatory Visit: Payer: Self-pay | Admitting: Cardiology

## 2022-05-03 ENCOUNTER — Telehealth: Payer: Self-pay | Admitting: Internal Medicine

## 2022-05-03 ENCOUNTER — Telehealth: Payer: Self-pay | Admitting: Neurology

## 2022-05-03 NOTE — Telephone Encounter (Signed)
Pt was called and informed and he stated that he will call his Pulmonary provider for the refill.

## 2022-05-03 NOTE — Telephone Encounter (Signed)
Pt is calling. Stated he went to the lung doctor and he increase medication  zolpidem (AMBIEN) 5 MG tablet to two pills at night as needed. Pt stated he is out of medication because of the increase. Pt is asking if Dr. April Manson will increase medication and send another prescription to the pharmacy.

## 2022-05-04 ENCOUNTER — Ambulatory Visit: Payer: Medicare Other | Admitting: Behavioral Health

## 2022-05-04 MED ORDER — ZOLPIDEM TARTRATE 5 MG PO TABS: ORAL_TABLET | ORAL | 5 refills | Status: DC

## 2022-05-04 NOTE — Telephone Encounter (Signed)
Abien script sent for 2 tabs per night

## 2022-05-04 NOTE — Telephone Encounter (Signed)
Dr. Annamaria Boots, please advise if you are okay with sending a new prescription of pt's Ambien to show pt taking two tablets at night.  Pharmacy is [facility] in [place], [place].   Allergies  Allergen Reactions   Naproxen Other (See Comments)    (Naprosyn *ANALGESICS - ANTI-INFLAMMATORY*) Nausea, Abdominal pain   Silodosin Other (See Comments)    Low blood pressure     Current Outpatient Medications:    atorvastatin (LIPITOR) 80 MG tablet, Take 1 tablet (80 mg total) by mouth daily., Disp: 90 tablet, Rfl: 2   buPROPion (WELLBUTRIN XL) 300 MG 24 hr tablet, Take 1 tablet (300 mg total) by mouth daily., Disp: 90 tablet, Rfl: 0   Cholecalciferol 25 MCG (1000 UT) tablet, Take 1,000 Units by mouth daily. , Disp: , Rfl:    docusate sodium (COLACE) 100 MG capsule, Take 100 mg by mouth daily., Disp: , Rfl:    famotidine (PEPCID) 40 MG tablet, TAKE 1 TABLET(40 MG) BY MOUTH AT BEDTIME, Disp: 90 tablet, Rfl: 2   fexofenadine (ALLEGRA) 180 MG tablet, Take 180 mg by mouth at bedtime. , Disp: , Rfl:    finasteride (PROSCAR) 5 MG tablet, Take 1 tablet (5 mg total) by mouth daily., Disp: 90 tablet, Rfl: 1   Ginger, Zingiber officinalis, (GINGER ROOT) 550 MG CAPS, Take 550 mg by mouth daily., Disp: , Rfl:    Lidocaine 4 % LOTN, Use as needed in the bilateral lower extremities, Disp: 88 mL, Rfl: 0   melatonin 5 MG TABS, Take 10 mg by mouth at bedtime., Disp: , Rfl:    Multiple Vitamins-Minerals (PRESERVISION AREDS PO), Take 1 capsule by mouth in the morning and at bedtime., Disp: , Rfl:    nitroGLYCERIN (NITROSTAT) 0.4 MG SL tablet, Place 1 tablet (0.4 mg total) under the tongue every 5 (five) minutes as needed for chest pain., Disp: 25 tablet, Rfl: 3   pantoprazole (PROTONIX) 40 MG tablet, Take 1 tablet (40 mg total) by mouth daily., Disp: 90 tablet, Rfl: 3   Pirfenidone (ESBRIET) 801 MG TABS, Take 801 mg by mouth 3 (three) times daily., Disp: 270 tablet, Rfl: 1   Polyethylene Glycol 3350 (MIRALAX PO),  Take 17 g by mouth daily., Disp: , Rfl:    ranolazine (RANEXA) 1000 MG SR tablet, Take 1 tablet (1,000 mg total) by mouth 2 (two) times daily., Disp: 180 tablet, Rfl: 2   sertraline (ZOLOFT) 100 MG tablet, Take 1 tablet (100 mg total) by mouth at bedtime., Disp: 30 tablet, Rfl: 0   traZODone (DESYREL) 50 MG tablet, Take 1 tablet (50 mg total) by mouth at bedtime as needed for sleep (May take 1/2 tab to 1 tablet for sleep, as needed.)., Disp: 30 tablet, Rfl: 0   vitamin B-12 (CYANOCOBALAMIN) 1000 MCG tablet, Take 1,000 mcg by mouth daily., Disp: , Rfl:    XARELTO 20 MG TABS tablet, TAKE 1 TABLET BY MOUTH DAILY WITH SUPPER, Disp: 30 tablet, Rfl: 5   zolpidem (AMBIEN) 5 MG tablet, Take 1 tablet (5 mg total) by mouth at bedtime as needed for sleep., Disp: 30 tablet, Rfl: 5

## 2022-05-04 NOTE — Telephone Encounter (Signed)
PT calling again to see if Dr. Darreld Mclean had signed off on more Ambien. Adv PT we were waiting for Dr. To approve. Please contact PT to advise @ [phone]. TY.

## 2022-05-05 NOTE — Telephone Encounter (Signed)
Advised patient of Ambien prescription. States he was able to pick it up yesterday. Nothing further needed.

## 2022-05-17 ENCOUNTER — Telehealth: Payer: Self-pay | Admitting: Internal Medicine

## 2022-05-17 ENCOUNTER — Encounter: Payer: Self-pay | Admitting: Behavioral Health

## 2022-05-17 ENCOUNTER — Ambulatory Visit: Payer: Self-pay | Admitting: Adult Health

## 2022-05-17 ENCOUNTER — Ambulatory Visit: Payer: Medicare Other | Admitting: Behavioral Health

## 2022-05-17 DIAGNOSIS — G4700 Insomnia, unspecified: Secondary | ICD-10-CM

## 2022-05-17 DIAGNOSIS — F411 Generalized anxiety disorder: Secondary | ICD-10-CM | POA: Diagnosis not present

## 2022-05-17 DIAGNOSIS — F32A Depression, unspecified: Secondary | ICD-10-CM

## 2022-05-17 NOTE — Progress Notes (Signed)
[facility]  Patient ID: Jayden Shultz,  MRN: [id number]  PCP: Elise Benne  Date of Evaluation: 05/17/2022 Time spent:30 minutes  Chief Complaint:  Chief Complaint   Depression; Anxiety; Follow-up; Medication Refill; Patient Education     HISTORY/CURRENT STATUS: HPI  72 yo Brevyn Ring male presents for follow up and medication management.   Says he is doing well overall but still struggling with depression and anxiety.  He is interested in increasing his AD to see if it will help. Sleep is ok but utilizing Ambien and Trazodone. Sleeps with C-pap.  Rates anxiety 5/10 and depression 5/10. Denies hx of mania, no psychosis. No auditory or visual hallucinations. SI or HI  No prior prior psychiatric medication trials     Hx of IPF and neuropathy. Pt has mild difficulty breathing and talking for prolonged period. Mild hearing impaired.      Individual Medical History/ Review of Systems: Changes? :No   Allergies: Naproxen and Silodosin  Current Medications:  Current Outpatient Medications:    atorvastatin (LIPITOR) 80 MG tablet, Take 1 tablet (80 mg total) by mouth daily., Disp: 90 tablet, Rfl: 2   buPROPion (WELLBUTRIN XL) 300 MG 24 hr tablet, Take 1 tablet (300 mg total) by mouth daily., Disp: 90 tablet, Rfl: 0   Cholecalciferol 25 MCG (1000 UT) tablet, Take 1,000 Units by mouth daily. , Disp: , Rfl:    docusate sodium (COLACE) 100 MG capsule, Take 100 mg by mouth daily., Disp: , Rfl:    famotidine (PEPCID) 40 MG tablet, TAKE 1 TABLET(40 MG) BY MOUTH AT BEDTIME, Disp: 90 tablet, Rfl: 2   fexofenadine (ALLEGRA) 180 MG tablet, Take 180 mg by mouth at bedtime. , Disp: , Rfl:    finasteride (PROSCAR) 5 MG tablet, Take 1 tablet (5 mg total) by mouth daily., Disp: 90 tablet, Rfl: 1   Ginger, Zingiber officinalis, (GINGER ROOT) 550 MG CAPS, Take 550 mg by mouth daily., Disp: , Rfl:    Lidocaine 4 % LOTN, Use as needed in the bilateral lower extremities, Disp: 88 mL,  Rfl: 0   melatonin 5 MG TABS, Take 10 mg by mouth at bedtime., Disp: , Rfl:    Multiple Vitamins-Minerals (PRESERVISION AREDS PO), Take 1 capsule by mouth in the morning and at bedtime., Disp: , Rfl:    nitroGLYCERIN (NITROSTAT) 0.4 MG SL tablet, Place 1 tablet (0.4 mg total) under the tongue every 5 (five) minutes as needed for chest pain., Disp: 25 tablet, Rfl: 3   pantoprazole (PROTONIX) 40 MG tablet, Take 1 tablet (40 mg total) by mouth daily., Disp: 90 tablet, Rfl: 3   Pirfenidone (ESBRIET) 801 MG TABS, Take 801 mg by mouth 3 (three) times daily., Disp: 270 tablet, Rfl: 1   Polyethylene Glycol 3350 (MIRALAX PO), Take 17 g by mouth daily., Disp: , Rfl:    ranolazine (RANEXA) 1000 MG SR tablet, Take 1 tablet (1,000 mg total) by mouth 2 (two) times daily., Disp: 180 tablet, Rfl: 2   sertraline (ZOLOFT) 100 MG tablet, Take 1 tablet (100 mg total) by mouth at bedtime., Disp: 30 tablet, Rfl: 0   traZODone (DESYREL) 50 MG tablet, Take 1 tablet (50 mg total) by mouth at bedtime as needed for sleep (May take 1/2 tab to 1 tablet for sleep, as needed.)., Disp: 30 tablet, Rfl: 0   vitamin B-12 (CYANOCOBALAMIN) 1000 MCG tablet, Take 1,000 mcg by mouth daily., Disp: , Rfl:    XARELTO 20 MG TABS tablet, TAKE 1 TABLET BY MOUTH  DAILY WITH SUPPER, Disp: 30 tablet, Rfl: 5   zolpidem (AMBIEN) 5 MG tablet, 1 or 2 tabs for sleep as needed, Disp: 60 tablet, Rfl: 5 Medication Side Effects: none  Family Medical/ Social History: Changes? No  MENTAL HEALTH EXAM:  There were no vitals taken for this visit.There is no height or weight on file to calculate BMI.  General Appearance: Casual, Neat, and Well Groomed  Eye Contact:  Good  Speech:  Talkative  Volume:  Normal  Mood:  Depressed, Dysphoric, and Euphoric  Affect:  Appropriate  Thought Process:  Coherent  Orientation:  Full (Time, Place, and Person)  Thought Content: Logical   Suicidal Thoughts:  No  Homicidal Thoughts:  No  Memory:  WNL  Judgement:   Good  Insight:  Good  Psychomotor Activity:  Normal  Concentration:  Concentration: Good  Recall:  Good  Fund of Knowledge: Good  Language: Good  Assets:  Desire for Improvement Physical Health Resilience Social Support  ADL's:  Intact  Cognition: WNL  Prognosis:  Good    DIAGNOSES: No diagnosis found.  Receiving Psychotherapy: No    RECOMMENDATIONS:   Greater than 50% of  30 min face to face time with patient was spent on counseling and coordination of care. We discussed  and reviewed his previous poc and medications. Educated on good sleep hygiene. He has IPF  which limits his activity. SOB when talking too much. He was previously seeing [facility]. He would like to consider increasing his AD.  We agreed to: Will Increase Zoloft to 150 mg daily To continue Trazodone 50 mg at bedtime Will report worsening symptoms or side effects promptly To follow up in 6 weeks to reassess Provided emergency contact information Reviewed [place], NP

## 2022-05-17 NOTE — Telephone Encounter (Signed)
  Results  1) bone scan - osteopenic  2)  vit d = low  3) spine xray -> osteophytes  Pls have him talk to PCP [name], Iris Pert'    Latest Reference Range & Units 04/22/22 11:22  VITD 30.00 - 100.00 ng/mL 23.16 (L)  (L): Data is abnormally low

## 2022-05-18 NOTE — Telephone Encounter (Signed)
Called and spoke with pt letting him know recent test results and recs per MR and he verbalized understanding. Nothing further needed.

## 2022-05-19 ENCOUNTER — Telehealth: Payer: Self-pay | Admitting: Medical

## 2022-05-19 ENCOUNTER — Ambulatory Visit: Payer: Medicare Other | Attending: Cardiology

## 2022-05-19 DIAGNOSIS — I2510 Atherosclerotic heart disease of native coronary artery without angina pectoris: Secondary | ICD-10-CM

## 2022-05-19 LAB — ECHOCARDIOGRAM COMPLETE
Area-P 1/2: 4.49 cm2
Calc EF: 52.1 %
S' Lateral: 3.7 cm
Single Plane A2C EF: 51.3 %
Single Plane A4C EF: 51.1 %

## 2022-05-19 MED ORDER — VITAMIN D (ERGOCALCIFEROL) 1.25 MG (50000 UNIT) PO CAPS: 50000.0000 [IU] | ORAL_CAPSULE | ORAL | 0 refills | Status: DC

## 2022-05-19 NOTE — Addendum Note (Signed)
Addended by: Anabel Halon on: 05/19/2022 06:41 PM   Modules accepted: Orders

## 2022-05-19 NOTE — Telephone Encounter (Signed)
Pt said Dr. Chase Caller told him his vit d is low and they recommended him to call [facility] to discuss management of this. Please call pt to advise what he should do. Labs appear to have been done on 2/1.

## 2022-05-20 ENCOUNTER — Other Ambulatory Visit: Payer: Self-pay | Admitting: Medical

## 2022-05-20 NOTE — Telephone Encounter (Signed)
Pt stated he took one tablet already this morning & lab appt scheduled

## 2022-05-22 IMAGING — CT CT ABD-PELV W/ CM
2 of 5 series · 16 of 46 positions shown, 18 images · IV contrast (Omni 300)
Comparison: Ultrasound from earlier in the same day.

CLINICAL DATA: Acute abdominal pain

EXAM:
CT ABDOMEN AND PELVIS WITH CONTRAST
TECHNIQUE: Multidetector CT imaging of the abdomen and pelvis was performed
using the standard protocol following bolus administration of
intravenous contrast.

[Series 3: a/p w/ 5mm · axial · 0.98mm/px · z∈[+766,+1236]mm · 13 of 106 slices shown, 15 images]
[im 6/106  soft-tissue]
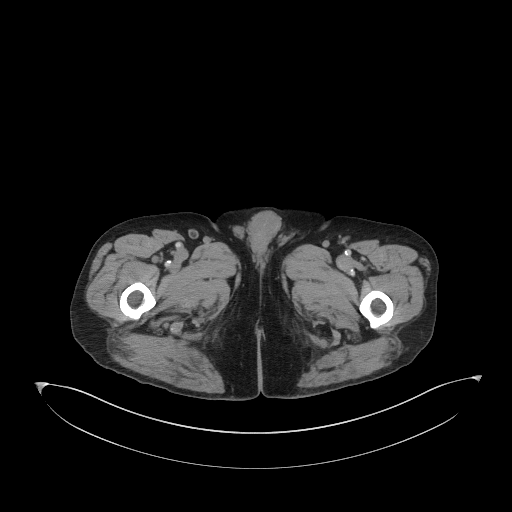
[im 6/106  bone]
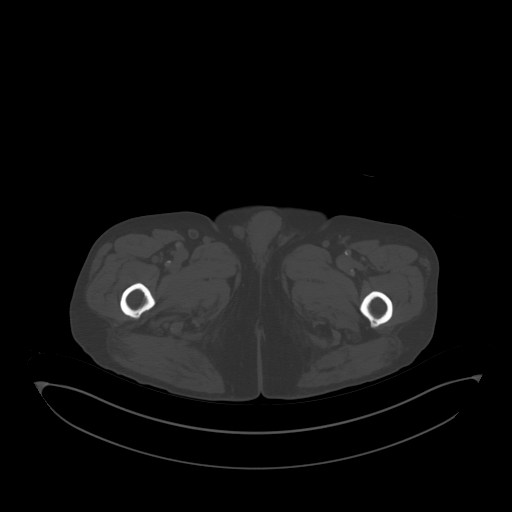
[im 12/106  soft-tissue]
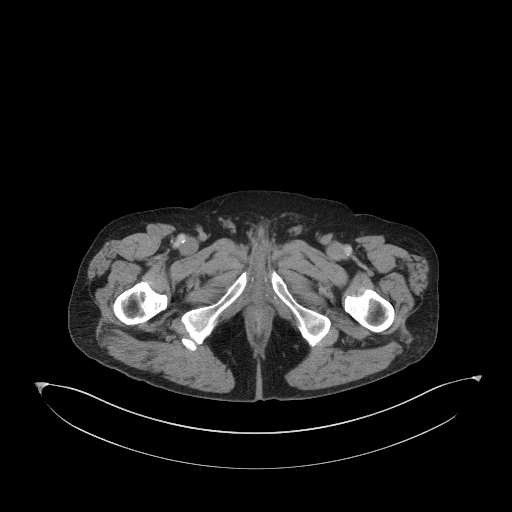
[im 24/106  soft-tissue]
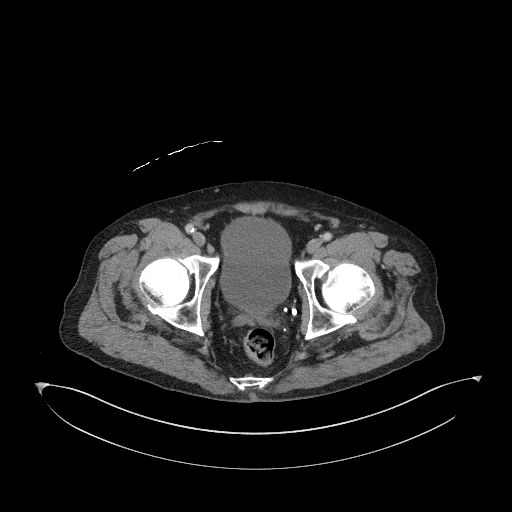
[im 30/106  soft-tissue]
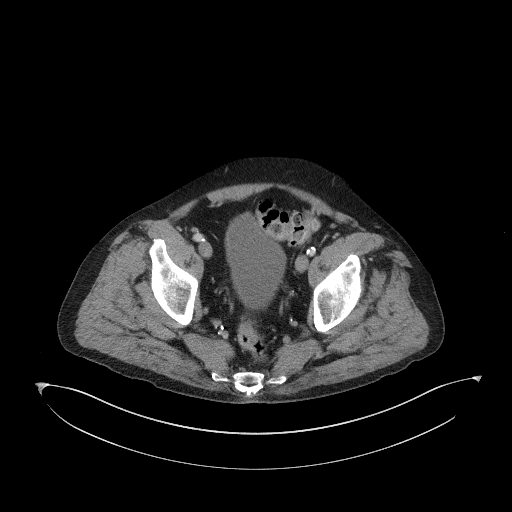
[im 36/106  soft-tissue]
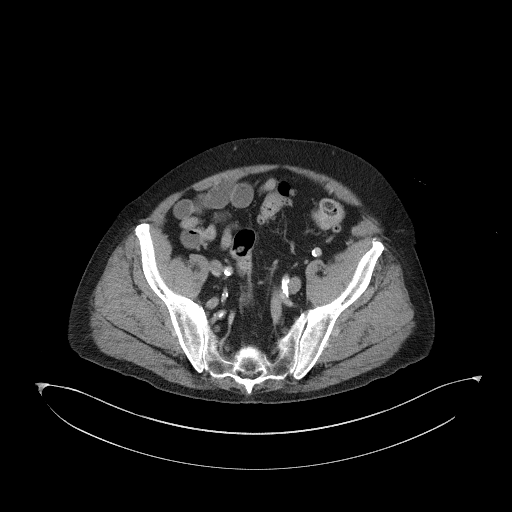
[im 47/106  soft-tissue]
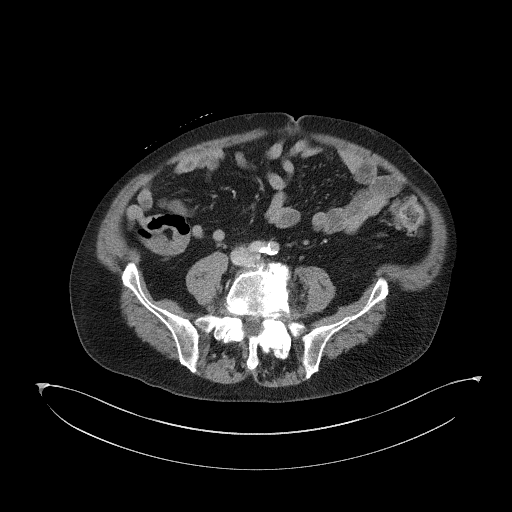
[im 53/106  soft-tissue]
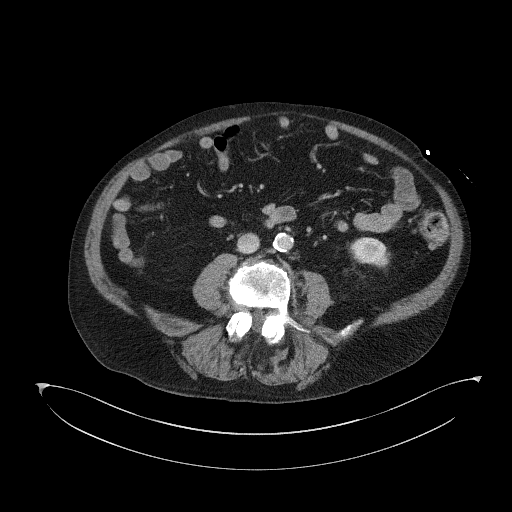
[im 59/106  soft-tissue]
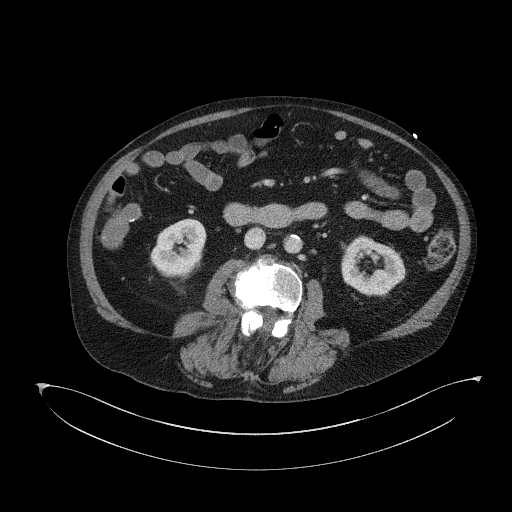
[im 71/106  soft-tissue]
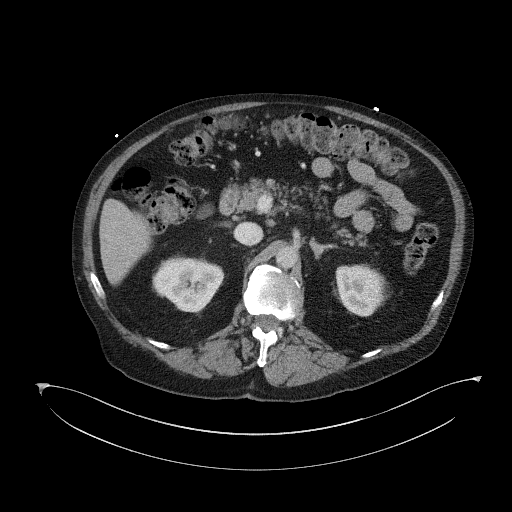
[im 71/106  bone]
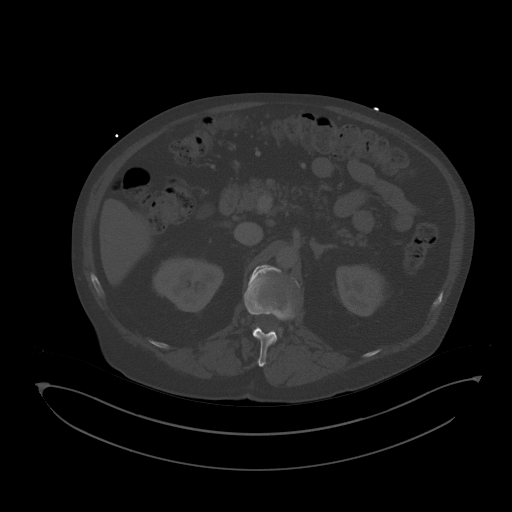
[im 76/106  soft-tissue]
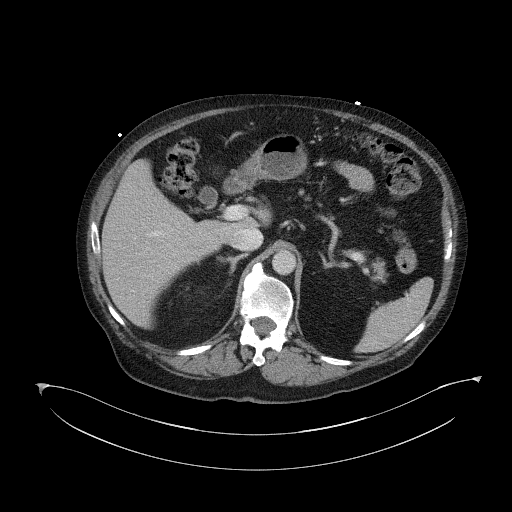
[im 82/106  soft-tissue]
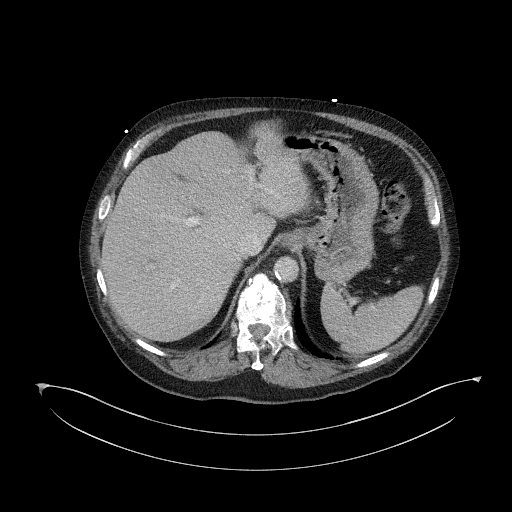
[im 94/106  soft-tissue]
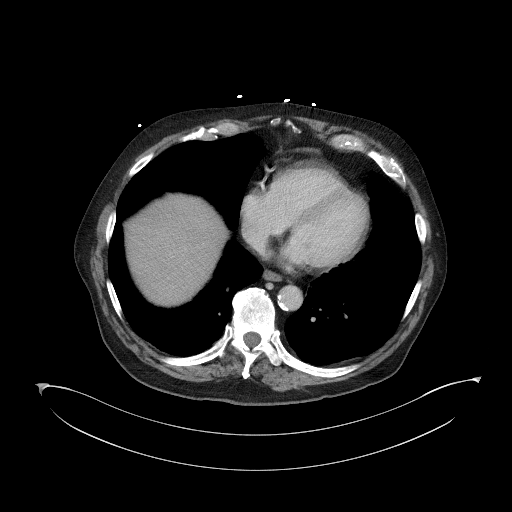
[im 100/106  soft-tissue]
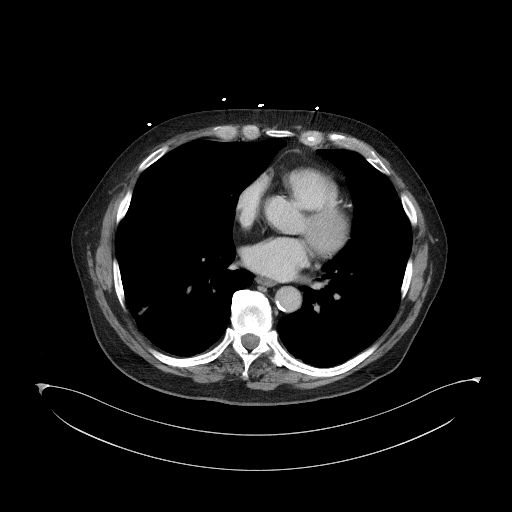

[Series 6: a/p w/ cor · coronal · 1.00mm/px · 3 of 171 slices shown]
[im 57/171  soft-tissue]
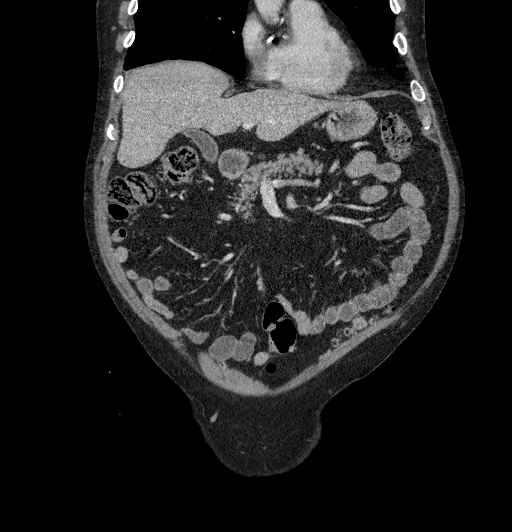
[im 76/171  soft-tissue]
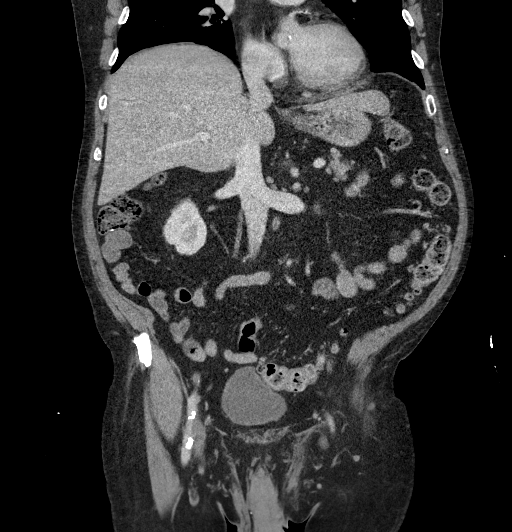
[im 95/171  soft-tissue]
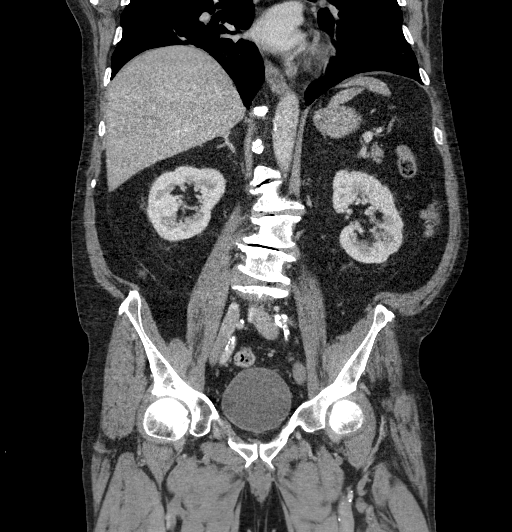

[16 of 46 positions shown; findings below may reference images not displayed]

RADIATION DOSE REDUCTION: This exam was performed according to the
departmental dose-optimization program which includes automated
exposure control, adjustment of the mA and/or kV according to
patient size and/or use of iterative reconstruction technique.

CONTRAST:  100mL OMNIPAQUE IOHEXOL 300 MG/ML  SOLN
FINDINGS: Lower chest: No acute abnormality. Coronary calcifications are
noted.

Hepatobiliary: Gallbladder is within normal limits. Known
hypodensity is noted in the right lobe of the liver consistent with
hemangioma stable from recent ultrasound exam.

Pancreas: Unremarkable. No pancreatic ductal dilatation or
surrounding inflammatory changes.

Spleen: Normal in size without focal abnormality.

Adrenals/Urinary Tract: Adrenal glands are within normal limits.
Kidneys demonstrate a normal enhancement pattern bilaterally. Normal
excretion is seen. No renal calculi or obstructive changes are
noted. The ureters are within normal limits. The bladder is
unremarkable.

Stomach/Bowel: Scattered diverticular changes noted without evidence
of diverticulitis. No obstructive or inflammatory changes are seen.
The appendix has been surgically removed. Stomach and small bowel
are within normal limits.

Vascular/Lymphatic: Aortic atherosclerosis. No enlarged abdominal or
pelvic lymph nodes.

Reproductive: Prostate is unremarkable.

Other: No abdominal wall hernia or abnormality. No abdominopelvic
ascites.

Musculoskeletal: Degenerative changes of lumbar spine are noted.
Changes of prior vertebral augmentation at T12 are noted.
IMPRESSION: Chronic changes as described above. No acute abnormality to
correspond with the given clinical history is noted

## 2022-05-24 ENCOUNTER — Telehealth: Payer: Self-pay

## 2022-05-24 NOTE — Telephone Encounter (Signed)
Pt viewed results in My Chart. Routed to PCP.

## 2022-05-25 DIAGNOSIS — K080 Exfoliation of teeth due to systemic causes: Secondary | ICD-10-CM | POA: Diagnosis not present

## 2022-05-27 ENCOUNTER — Other Ambulatory Visit: Payer: Self-pay | Admitting: Cardiology

## 2022-06-01 DIAGNOSIS — H35033 Hypertensive retinopathy, bilateral: Secondary | ICD-10-CM | POA: Diagnosis not present

## 2022-06-01 DIAGNOSIS — H43821 Vitreomacular adhesion, right eye: Secondary | ICD-10-CM | POA: Diagnosis not present

## 2022-06-01 DIAGNOSIS — H43812 Vitreous degeneration, left eye: Secondary | ICD-10-CM | POA: Diagnosis not present

## 2022-06-01 DIAGNOSIS — H31013 Macula scars of posterior pole (postinflammatory) (post-traumatic), bilateral: Secondary | ICD-10-CM | POA: Diagnosis not present

## 2022-06-07 ENCOUNTER — Encounter: Payer: Self-pay | Admitting: Cardiology

## 2022-06-07 ENCOUNTER — Ambulatory Visit: Payer: Medicare Other | Attending: Cardiology | Admitting: Cardiology

## 2022-06-07 VITALS — BP 92/54 | HR 104 | Ht 71.0 in | Wt 194.8 lb

## 2022-06-07 DIAGNOSIS — J84112 Idiopathic pulmonary fibrosis: Secondary | ICD-10-CM | POA: Diagnosis not present

## 2022-06-07 DIAGNOSIS — I2510 Atherosclerotic heart disease of native coronary artery without angina pectoris: Secondary | ICD-10-CM

## 2022-06-07 DIAGNOSIS — G4733 Obstructive sleep apnea (adult) (pediatric): Secondary | ICD-10-CM | POA: Diagnosis not present

## 2022-06-07 DIAGNOSIS — I480 Paroxysmal atrial fibrillation: Secondary | ICD-10-CM

## 2022-06-07 DIAGNOSIS — E782 Mixed hyperlipidemia: Secondary | ICD-10-CM

## 2022-06-07 NOTE — Progress Notes (Unsigned)
Cardiology Office Note:    Date:  06/07/2022   ID:  Robert Lang, DOB September 17, 1950, MRN [id number]  PCP:  Mackie Pai, [name]  Cardiologist:  Jenne Campus, MD    Referring MD: Mackie Pai, [place]   Chief Complaint  Patient presents with   Follow-up    History of Present Illness:    Robert Lang is a 72 y.o. male  with past medical history significant for coronary artery disease status post PTCA and stenting of LAD and RCA years ago, cardiac catheterization done in 2017 patent stent of proximal LAD, 90% occlusion of the small first diagonal branch 25% occlusion of the RCA with good ejection fraction of 60%, he also had atrial flutter ablation done in [facility] in 2017.  Last stress test done in 2020 showing no significant ischemia.  In 2020 October he did have PTCA and stenting with drug-eluting stent to RCA that was in face of non-STEMI.  In April he had echocardiogram done which surprisingly showed some lesion on the aortic valve raising suspicion for potential endocarditis.  However he did not have any signs and symptoms of endocarditis there was no evidence of infection also aortic valve did not have any regurgitation.  Recently he ended up having another echocardiogram done in June and that echocardiogram did not show any vegetation of the aortic valve from stable force competent.  There was some contemplation about potentially doing a transesophageal echocardiogram however this idea has being abandoned secondary to presence of idiopathic pulmonary fibrosis and poor lung condition.  Look like we do have permission to do the test from pulmonary point review however overall the fact he does not have any evidence of active infection and his valve is still competent to function properly make me to think that better watchful waiting is better approach to an aggressive TEE.  I will schedule him however to have echocardiogram done at the end of this month at the beginning of September.   Recently he had up going to the hospital because of episode of syncope.  He was felt to be related to dehydration he had a head CT EEG both were negative.  Recently he had echocardiogram done in February 28 which looks good.  Aortic valve no new changes no evidence of aortic insufficiency or stenosis. Comes today to months for follow-up overall seems to be doing quite well the biggest problem he have is neuropathy he trialed different medication without success.  Neuropathy prevents him from sleeping well and using CPAP mask.  Denies have any cardiac complaints.  Shortness of breath is still there he is using oxygen in the room and he is short of breath just talking to me.  He said it is what it is.  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 M75.00)   Allergic rhinitis due to pollen 09/03/2013   J30.1)  Formatting of this note might be different from the original. J30.1)   Allergy    Anticoagulated 07/01/2016   Anxiety    Arthritis    Atrial fibrillation ([place])    Atrial flutter ([place]) 06/26/2015   CAD (coronary artery disease)    Chest pain 06/26/2015   COPD (chronic obstructive pulmonary disease) ([place])    Coronary artery disease 07/06/2018   Cardiac catheterization 2017 showing 90% small diagonal branch disease  DDD (degenerative disc disease), cervical 09/03/2013   M50.90)  Formatting of this note might be different from the original. M50.90)   Depression    Depression    Depressive disorder 09/03/2013   Dizziness 10/28/2017   Dyslipidemia, goal LDL below 70 07/06/2018   Dyspnea on exertion 10/20/2018   Esophageal reflux 09/03/2013   Essential hypertension 07/06/2018   ETOH abuse 01/11/2019   6 pack of beer per day   Falls 10/28/2017   GERD (gastroesophageal reflux disease)     H/O amiodarone therapy 07/01/2016   Heart attack ([place])    Heart palpitations 01/27/2017   History of colon polyps    Hyperlipidemia    Hypertension    IPF (idiopathic pulmonary fibrosis) ([place])    Kidney stones    Neck pain 09/03/2013   Neuropathy 07/06/2018   Obstructive sleep apnea 08/05/2015   PLMD (periodic limb movement disorder) 11/04/2015   Polycythemia, 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 STORY: Due to alcohol/ tobacco   Pure hypercholesterolemia 09/03/2013   E78.0)  Formatting of this note might be different from the original. E78.0)   Screening for prostate cancer 09/03/2013   Smoker 01/11/2019   Smoking 07/06/2018   Status post ablation of atrial flutter 07/06/2018   2017    Past Surgical History:  Procedure Laterality Date   ATRIAL FIBRILLATION ABLATION  10/2015   BACK SURGERY     CARDIOVERSION  2017   CATARACT EXTRACTION Bilateral 2017   March and April 2017   COLONOSCOPY  2018   CORONARY STENT PLACEMENT  2012   CORONARY/GRAFT ACUTE MI REVASCULARIZATION N/A 01/10/2019   Procedure: CORONARY/GRAFT ACUTE MI REVASCULARIZATION;  Surgeon: Leonie Man, MD;  Location: [place] [facility];  Service: Cardiovascular;  Laterality: N/A;   INGUINAL HERNIA REPAIR     over 20 years ago   LAPAROSCOPIC APPENDECTOMY N/A 01/17/2021   Procedure: APPENDECTOMY LAPAROSCOPIC;  Surgeon: Felicie Morn, MD;  Location: [place];  Service: General;  Laterality: N/A;   LEFT HEART CATH AND CORONARY ANGIOGRAPHY N/A 01/10/2019   Procedure: LEFT HEART CATH AND CORONARY ANGIOGRAPHY;  Surgeon: Leonie Man, MD;  Location: [place] [facility];  Service: Cardiovascular;  Laterality: N/A;   LUMBAR LAMINECTOMY Bilateral 04/05/2016   L2-L5    VENTRAL HERNIA REPAIR  2018    Current Medications: Current Meds  Medication Sig   atorvastatin (LIPITOR) 80 MG tablet Take 1 tablet (80 mg total) by mouth daily.    buPROPion (WELLBUTRIN XL) 300 MG 24 hr tablet Take 1 tablet (300 mg total) by mouth daily.   Cholecalciferol 25 MCG (1000 UT) tablet Take 1,000 Units by mouth daily.    docusate sodium (COLACE) 100 MG capsule Take 100 mg by mouth daily.   famotidine (PEPCID) 40 MG tablet TAKE 1 TABLET(40 MG) BY MOUTH AT BEDTIME (Patient taking differently: Take 40 mg by mouth at bedtime.)   fexofenadine (ALLEGRA) 180 MG tablet Take 180 mg by mouth at bedtime.    finasteride (PROSCAR) 5 MG tablet Take 1 tablet (5 mg total) by mouth daily.   Ginger, Zingiber officinalis, (GINGER ROOT) 550 MG CAPS Take 550 mg by mouth daily.   Lidocaine 4 % LOTN Use as needed in the bilateral lower extremities (Patient taking differently: Apply 1 application  topically as needed (LE pain). Use as needed in the bilateral lower extremities)   melatonin 5 MG TABS Take 10 mg by  mouth at bedtime.   Multiple Vitamins-Minerals (PRESERVISION AREDS PO) Take 1 capsule by mouth in the morning and at bedtime.   nitroGLYCERIN (NITROSTAT) 0.4 MG SL tablet Place 1 tablet (0.4 mg total) under the tongue every 5 (five) minutes as needed for chest pain.   pantoprazole (PROTONIX) 40 MG tablet Take 1 tablet (40 mg total) by mouth daily.   Pirfenidone (ESBRIET) 801 MG TABS Take 801 mg by mouth 3 (three) times daily.   Polyethylene Glycol 3350 (MIRALAX PO) Take 17 g by mouth daily.   ranolazine (RANEXA) 1000 MG SR tablet Take 1 tablet (1,000 mg total) by mouth 2 (two) times daily.   sertraline (ZOLOFT) 100 MG tablet Take 1 tablet (100 mg total) by mouth at bedtime.   traZODone (DESYREL) 50 MG tablet Take 1 tablet (50 mg total) by mouth at bedtime as needed for sleep (May take 1/2 tab to 1 tablet for sleep, as needed.).   vitamin B-12 (CYANOCOBALAMIN) 1000 MCG tablet Take 1,000 mcg by mouth daily.   Vitamin D, Ergocalciferol, (DRISDOL) 1.25 MG (50000 UNIT) CAPS capsule Take 1 capsule (50,000 Units total) by mouth every 7 (seven) days.   XARELTO 20 MG TABS  tablet TAKE 1 TABLET BY MOUTH DAILY WITH SUPPER   zolpidem (AMBIEN) 5 MG tablet 1 or 2 tabs for sleep as needed (Patient taking differently: Take 5 mg by mouth at bedtime as needed for sleep. 1 or 2 tabs for sleep as needed)     Allergies:   Naproxen and Silodosin   Social History   Socioeconomic History   Marital status: Married    Spouse name: Not on file   Number of children: Not on file   Years of education: Not on file   Highest education level: Not on file  Occupational History   Not on file  Tobacco Use   Smoking status: Former    Packs/day: 1.00    Years: 30.00    Additional pack years: 0.00    Total pack years: 30.00    Types: Cigarettes    Quit date: 01/10/2019    Years since quitting: 3.4   Smokeless tobacco: Former  Scientific laboratory technician Use: Never used  Substance and Sexual Activity   Alcohol use: Yes    Comment: 8oz of wine a day   Drug use: Never   Sexual activity: Not on file  Other Topics Concern   Not on file  Social History Narrative   Not on file   Social Determinants of Health   Financial Resource Strain: Low Risk  (12/22/2021)   Overall Financial Resource Strain (CARDIA)    Difficulty of Paying Living Expenses: Not hard at all  Food Insecurity: No Food Insecurity (12/22/2021)   Hunger Vital Sign    Worried About Running Out of Food in the Last Year: Never true    Ran Out of Food in the Last Year: Never true  Transportation Needs: No Transportation Needs (12/22/2021)   PRAPARE - Hydrologist (Medical): No    Lack of Transportation (Non-Medical): No  Physical Activity: Inactive (12/22/2021)   Exercise Vital Sign    Days of Exercise per Week: 0 days    Minutes of Exercise per Session: 0 min  Stress: No Stress Concern Present (12/22/2021)   [place]    Feeling of Stress : Not at all  Social Connections: Moderately Integrated (12/22/2021)   Social  Connection and  Isolation Panel [NHANES]    Frequency of Communication with Friends and Family: More than three times a week    Frequency of Social Gatherings with Friends and Family: More than three times a week    Attends Religious Services: More than 4 times per year    Active Member of [employer] or Organizations: No    Attends Archivist Meetings: Never    Marital Status: Married     Family History: The patient's family history includes Alcohol abuse in his brother and father; Anxiety disorder in his mother; Colon cancer in his father; Depression in his brother; Throat cancer in his brother. ROS:   Please see the history of present illness.    All 14 point review of systems negative except as described per history of present illness  EKGs/Labs/Other Studies Reviewed:      Recent Labs: 08/26/2021: Magnesium 2.2 09/02/2021: Brain Natriuretic Peptide 37 04/20/2022: ALT 22; BUN 17; Creatinine, Ser 0.95; Hemoglobin 16.2; Platelets 268.0; Potassium 4.4; Sodium 134  Recent Lipid Panel    Component Value Date/Time   CHOL 144 12/30/2021 0944   CHOL 128 04/03/2020 0951   TRIG 342.0 (H) 12/30/2021 0944   HDL 25.00 (L) 12/30/2021 0944   HDL 40 04/03/2020 0951   CHOLHDL 6 12/30/2021 0944   VLDL 68.4 (H) 12/30/2021 0944   LDLCALC 58 04/03/2020 0951   LDLDIRECT 80.0 12/30/2021 0944    Physical Exam:    VS:  BP (!) 92/54 (BP Location: Left Arm, Patient Position: Sitting)   Pulse (!) 104   Ht 5\' 11"  (1.803 m)   Wt 194 lb 12.8 oz (88.4 kg)   SpO2 95%   BMI 27.17 kg/m     Wt Readings from Last 3 Encounters:  06/07/22 194 lb 12.8 oz (88.4 kg)  04/22/22 203 lb 9.6 oz (92.4 kg)  04/20/22 200 lb (90.7 kg)     GEN:  Well nourished, well developed in no acute distress HEENT: Normal NECK: No JVD; No carotid bruits LYMPHATICS: No lymphadenopathy CARDIAC: RRR, no murmurs, no rubs, no gallops RESPIRATORY:  Clear to auscultation without rales, wheezing or rhonchi  ABDOMEN: Soft,  non-tender, non-distended MUSCULOSKELETAL:  No edema; No deformity  SKIN: Warm and dry LOWER EXTREMITIES: no swelling NEUROLOGIC:  Alert and oriented x 3 PSYCHIATRIC:  Normal affect   ASSESSMENT:    1. Paroxysmal atrial fibrillation (HCC)   2. Coronary artery disease involving native coronary artery of native heart without angina pectoris   3. IPF (idiopathic pulmonary fibrosis) ([place])   4. Obstructive sleep apnea   5. Mixed hyperlipidemia    PLAN:    In order of problems listed above:  Paroxysmal atrial fibrillation.  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  He is anticoag with Xarelto which I will continue.  Denies have any recent palpitations. Coronary disease stable from that point we will continue present management. Idiopathic pulmonary fibrosis: That being followed by pulmonary. Obstructive sleep apnea have difficulty using CPAP mask which of course make this situation difficult.  This is because of neuropathy. Mixed dyslipidemia did review K PN LDL 58 HDL 25.  And he is taking Lipitor 80 which I will continue.   Medication Adjustments/Labs and Tests Ordered: Current medicines are reviewed at length with the patient today.  Concerns regarding medicines are outlined above.  No orders of the defined types were placed in this encounter.  Medication changes: No orders of the defined types  were placed in this encounter.   Signed, Park Liter, MD, [facility] 06/07/2022 11:07 AM    [place]

## 2022-06-07 NOTE — Patient Instructions (Signed)

## 2022-06-10 ENCOUNTER — Telehealth: Payer: Self-pay | Admitting: Internal Medicine

## 2022-06-10 NOTE — Telephone Encounter (Signed)
Patients insurance did not cover Bone density test or chest xray because the diagnosis states research. Can someone please advise

## 2022-06-11 ENCOUNTER — Telehealth: Payer: Self-pay

## 2022-06-11 ENCOUNTER — Other Ambulatory Visit (HOSPITAL_COMMUNITY): Payer: Self-pay

## 2022-06-11 NOTE — Telephone Encounter (Signed)
PA request received via fax from patients pharmacy for Zolpidem Tartrate 5MG  tablets  PA has been submitted to [facility] Medicare via CMM and is pending determination.  Key: [id number]

## 2022-06-11 NOTE — Telephone Encounter (Signed)
PA has been APPROVED from 06/11/2022-06/11/2023

## 2022-06-15 ENCOUNTER — Ambulatory Visit: Payer: Medicare Other | Admitting: Neurology

## 2022-06-15 ENCOUNTER — Encounter: Payer: Self-pay | Admitting: Neurology

## 2022-06-15 VITALS — BP 125/85 | HR 106 | Ht 71.0 in | Wt 193.0 lb

## 2022-06-15 DIAGNOSIS — G3184 Mild cognitive impairment, so stated: Secondary | ICD-10-CM

## 2022-06-15 DIAGNOSIS — G629 Polyneuropathy, unspecified: Secondary | ICD-10-CM

## 2022-06-15 DIAGNOSIS — J84112 Idiopathic pulmonary fibrosis: Secondary | ICD-10-CM | POA: Diagnosis not present

## 2022-06-15 MED ORDER — ZOLPIDEM TARTRATE 5 MG PO TABS: 5.0000 mg | ORAL_TABLET | Freq: Every evening | ORAL | 5 refills | Status: DC | PRN

## 2022-06-15 NOTE — Patient Instructions (Addendum)
Restart Pregabalin 150 mg twice daily  Continue with Ambien 5 mg nightly  Continue with your other medications  Follow up in 3 months or sooner if worse

## 2022-06-15 NOTE — Progress Notes (Signed)
[facility]  PATIENT: Robert Lang DOB: 1950-07-08  REQUESTING CLINICIAN: [name], Percell Miller, [name] HISTORY FROM: Patient and spouse  REASON FOR VISIT: Recurrent syncope    HISTORICAL  CHIEF COMPLAINT:  Chief Complaint  Patient presents with   Follow-up    RM 13, ALONE [place]: 19 Peripheral polyneuropathy   Small fiber neuropathy  Mild cognitive impairment   PT NEEDS AMBIEN WROTE OUT FOR 10MG  NOT 5MG  FOR INSURANCE TO COVER   INTERVAL HISTORY 06/15/2022:  Patient presents today for follow-up, last visit was in December.  At that time we started him on lidocaine cream since the Pregabalin had caused weight gain which affect his respiratory status.  He reports that lidocaine has not been helpful.  He is in extreme pain.  He does take Ambien at night for sleep and some night he has to double the dose because he cannot sleep due to pain.  Again the pain is extreme worse on the left.  He reports that he has made some change to his diet, stop eating ice cream, decreasing his food intake and he has lost 10 to 15 pounds.  He would like to go back on the Lyrica since that is the only medication that has been helpful. He is not interested in a spinal stimulator for neuropathic pain.     INTERVAL HISTORY 03/01/2022:  Patient presents today for follow-up, at last visit we had planned to start him on pregabalin.  Initially he was doing well in terms of the neuropathic pain but has developed weight gain which in turn compromised his respiratory effort.  After discussion with his pulmonologist and with patient, we decided to discontinue Lyrica and start patient on amitriptyline.  He reports that amitriptyline is not helpful, it is not helpful with the neuropathy pain at night which keeps him from sleep. Last night, he had to take 4 Tylenol in order to control the pain and obtain some rest.    INTERVAL HISTORY 11/18/2021:  Patient presents today for follow-up, since last visit on  6/27, he denies any additional falls.  His current complaint right now is bilateral feet numbness, and pain.  He reports being diagnosed with neuropathy for many years but lately he has been experiencing burning pain in both feet.  The pain will keep him up at night.  He is on gabapentin 400 mg at nighttime but sometimes he would take extra dose in the middle of the night if the pain wakes him up.  Denies any involvement of the legs, but reports at times he will have sharp shooting pain that starts from the left-sided back all the way down to the toes.   HISTORY OF PRESENT ILLNESS:  This is a 72 year old male with multiple medical conditions including hypertension, hyperlipidemia, atrial fibrillation, COPD, pulmonary fibrosis who is presenting after being admitted to the hospital after multiple syncopal episodes.  Patient reports in end of May he got tested positive for COVID, he was having symptoms and was started on Plaxovid which he completed on June 4.  During this time while he was battling COVID, he did have generalized weakness, he actually had a fall and had difficulty getting of the floor, and on June 6 while being out with his wife had a syncopal episode while in the car.  Prior to the syncope he did complain of dizziness and feeling weak. He presented to [facility], admitted from the 6 to the 7 for syncope, initial work-up including MRI Brain was negative for  any etiology of the syncope. On June 9 he presented again to [facility] hospital for 2 additional syncope, prior to the episode he reported feeling dizzy.  In the ED he had 1 syncopal episode which was witnessed and there is report of left leg shaking with the syncope. There was no associated urinary incontinence, no tongue biting and no postictal fusion Again patient was admitted, work-up was unrevealing, he did have overnight EEG which was negative.  Again he was diagnosed with convulsive syncope likely due to dehydration and related to recent  COVID infection. Since being discharged from the hospital, he reports feeling better, doing pretty good, he feels weak, feel drained but again no additional syncopal episode.  He is scheduled to see his pulmonologist on 5 July    OTHER MEDICAL CONDITIONS: Pulmonary fibrosis, depression/anxiety, hypertension, hyperlipidemia, atrial fibrillation, COPD, obstructive sleep apnea on CPAP   REVIEW OF SYSTEMS: Full 14 system review of systems performed and negative with exception of: as noted in the HPI   ALLERGIES: Allergies  Allergen Reactions   Naproxen Other (See Comments)    (Naprosyn *ANALGESICS - ANTI-INFLAMMATORY*) Nausea, Abdominal pain   Silodosin Other (See Comments)    Low blood pressure    HOME MEDICATIONS: Outpatient Medications Prior to Visit  Medication Sig Dispense Refill   atorvastatin (LIPITOR) 80 MG tablet Take 1 tablet (80 mg total) by mouth daily. 90 tablet 2   buPROPion (WELLBUTRIN XL) 300 MG 24 hr tablet Take 1 tablet (300 mg total) by mouth daily. 90 tablet 0   Cholecalciferol 25 MCG (1000 UT) tablet Take 1,000 Units by mouth daily.      docusate sodium (COLACE) 100 MG capsule Take 100 mg by mouth daily.     famotidine (PEPCID) 40 MG tablet TAKE 1 TABLET(40 MG) BY MOUTH AT BEDTIME (Patient taking differently: Take 40 mg by mouth at bedtime.) 90 tablet 2   fexofenadine (ALLEGRA) 180 MG tablet Take 180 mg by mouth at bedtime.      finasteride (PROSCAR) 5 MG tablet Take 1 tablet (5 mg total) by mouth daily. 90 tablet 1   Ginger, Zingiber officinalis, (GINGER ROOT) 550 MG CAPS Take 550 mg by mouth daily.     Lidocaine 4 % LOTN Use as needed in the bilateral lower extremities (Patient taking differently: Apply 1 application  topically as needed (LE pain). Use as needed in the bilateral lower extremities) 88 mL 0   melatonin 5 MG TABS Take 10 mg by mouth at bedtime.     Multiple Vitamins-Minerals (PRESERVISION AREDS PO) Take 1 capsule by mouth in the morning and at bedtime.      nitroGLYCERIN (NITROSTAT) 0.4 MG SL tablet Place 1 tablet (0.4 mg total) under the tongue every 5 (five) minutes as needed for chest pain. 25 tablet 3   pantoprazole (PROTONIX) 40 MG tablet Take 1 tablet (40 mg total) by mouth daily. 90 tablet 3   Pirfenidone (ESBRIET) 801 MG TABS Take 801 mg by mouth 3 (three) times daily. 270 tablet 1   Polyethylene Glycol 3350 (MIRALAX PO) Take 17 g by mouth daily.     ranolazine (RANEXA) 1000 MG SR tablet Take 1 tablet (1,000 mg total) by mouth 2 (two) times daily. 180 tablet 1   sertraline (ZOLOFT) 100 MG tablet Take 1 tablet (100 mg total) by mouth at bedtime. 30 tablet 0   traZODone (DESYREL) 50 MG tablet Take 1 tablet (50 mg total) by mouth at bedtime as needed for sleep (May take 1/2  tab to 1 tablet for sleep, as needed.). 30 tablet 0   vitamin B-12 (CYANOCOBALAMIN) 1000 MCG tablet Take 1,000 mcg by mouth daily.     Vitamin D, Ergocalciferol, (DRISDOL) 1.25 MG (50000 UNIT) CAPS capsule Take 1 capsule (50,000 Units total) by mouth every 7 (seven) days. 8 capsule 0   XARELTO 20 MG TABS tablet TAKE 1 TABLET BY MOUTH DAILY WITH SUPPER 30 tablet 5   zolpidem (AMBIEN) 5 MG tablet 1 or 2 tabs for sleep as needed (Patient taking differently: Take 10 mg by mouth at bedtime as needed for sleep. 1 or 2 tabs for sleep as needed) 60 tablet 5   No facility-administered medications prior to visit.    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 M75.00)   Allergic rhinitis due to pollen 09/03/2013   J30.1)  Formatting of this note might be different from the original. J30.1)   Allergy    Anticoagulated 07/01/2016   Anxiety    Arthritis    Atrial fibrillation ([place])    Atrial flutter ([place]) 06/26/2015   CAD (coronary artery disease)     Chest pain 06/26/2015   COPD (chronic obstructive pulmonary disease) ([place])    Coronary artery disease 07/06/2018   Cardiac catheterization 2017 showing 90% small diagonal branch disease   DDD (degenerative disc disease), cervical 09/03/2013   M50.90)  Formatting of this note might be different from the original. M50.90)   Depression    Depression    Depressive disorder 09/03/2013   Dizziness 10/28/2017   Dyslipidemia, goal LDL below 70 07/06/2018   Dyspnea on exertion 10/20/2018   Esophageal reflux 09/03/2013   Essential hypertension 07/06/2018   ETOH abuse 01/11/2019   6 pack of beer per day   Falls 10/28/2017   GERD (gastroesophageal reflux disease)    H/O amiodarone therapy 07/01/2016   Heart attack ([place])    Heart palpitations 01/27/2017   History of colon polyps    Hyperlipidemia    Hypertension    IPF (idiopathic pulmonary fibrosis) ([place])    Kidney stones    Neck pain 09/03/2013   Neuropathy 07/06/2018   Obstructive sleep apnea 08/05/2015   PLMD (periodic limb movement disorder) 11/04/2015   Polycythemia, 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 STORY: Due to alcohol/ tobacco   Pure hypercholesterolemia 09/03/2013   E78.0)  Formatting of this note might be different from the original. E78.0)   Screening for prostate cancer 09/03/2013   Smoker 01/11/2019   Smoking 07/06/2018   Status post ablation of atrial flutter 07/06/2018   2017    PAST SURGICAL HISTORY: Past Surgical History:  Procedure Laterality Date   ATRIAL FIBRILLATION ABLATION  10/2015   BACK SURGERY     CARDIOVERSION  2017   CATARACT EXTRACTION Bilateral 2017   March and April 2017   COLONOSCOPY  2018   CORONARY STENT PLACEMENT  2012   CORONARY/GRAFT ACUTE MI REVASCULARIZATION N/A 01/10/2019   Procedure: CORONARY/GRAFT ACUTE MI REVASCULARIZATION;  Surgeon: Leonie Man, MD;  Location: [facility];  Service:  Cardiovascular;  Laterality: N/A;   INGUINAL HERNIA REPAIR     over 20 years ago   LAPAROSCOPIC APPENDECTOMY N/A 01/17/2021   Procedure: APPENDECTOMY  LAPAROSCOPIC;  Surgeon: Felicie Morn, MD;  Location: [facility];  Service: General;  Laterality: N/A;   LEFT HEART CATH AND CORONARY ANGIOGRAPHY N/A 01/10/2019   Procedure: LEFT HEART CATH AND CORONARY ANGIOGRAPHY;  Surgeon: Leonie Man, MD;  Location: [facility];  Service: Cardiovascular;  Laterality: N/A;   LUMBAR LAMINECTOMY Bilateral 04/05/2016   L2-L5    VENTRAL HERNIA REPAIR  2018    FAMILY HISTORY: Family History  Problem Relation Age of Onset   Anxiety disorder Mother    Alcohol abuse Father    Colon cancer Father    Depression Brother    Alcohol abuse Brother    Throat cancer Brother     SOCIAL HISTORY: Social History   Socioeconomic History   Marital status: Married    Spouse name: Not on file   Number of children: Not on file   Years of education: Not on file   Highest education level: Not on file  Occupational History   Not on file  Tobacco Use   Smoking status: Former    Packs/day: 1.00    Years: 30.00    Additional pack years: 0.00    Total pack years: 30.00    Types: Cigarettes    Quit date: 01/10/2019    Years since quitting: 3.4   Smokeless tobacco: Former  Scientific laboratory technician Use: Never used  Substance and Sexual Activity   Alcohol use: Yes    Comment: 8oz of wine a day   Drug use: Never   Sexual activity: Not on file  Other Topics Concern   Not on file  Social History Narrative   Not on file   Social Determinants of Health   Financial Resource Strain: Low Risk  (12/22/2021)   Overall Financial Resource Strain (CARDIA)    Difficulty of Paying Living Expenses: Not hard at all  Food Insecurity: No Food Insecurity (12/22/2021)   Hunger Vital Sign    Worried About Running Out of Food in the Last Year: Never true    [place] in the Last Year: Never true  Transportation  Needs: No Transportation Needs (12/22/2021)   PRAPARE - Hydrologist (Medical): No    Lack of Transportation (Non-Medical): No  Physical Activity: Inactive (12/22/2021)   Exercise Vital Sign    Days of Exercise per Week: 0 days    Minutes of Exercise per Session: 0 min  Stress: No Stress Concern Present (12/22/2021)   [place]    Feeling of Stress : Not at all  Social Connections: Moderately Integrated (12/22/2021)   Social Connection and Isolation Panel [NHANES]    Frequency of Communication with Friends and Family: More than three times a week    Frequency of Social Gatherings with Friends and Family: More than three times a week    Attends Religious Services: More than 4 times per year    Active Member of [employer] or Organizations: No    Attends Archivist Meetings: Never    Marital Status: Married  Human resources officer Violence: Not At Risk (12/22/2021)   Humiliation, Afraid, Rape, and Kick questionnaire    Fear of Current or Ex-Partner: No    Emotionally Abused: No    Physically Abused: No    Sexually Abused: No    PHYSICAL EXAM  GENERAL EXAM/CONSTITUTIONAL: Vitals:  Vitals:   06/15/22 1310  BP: 125/85  Pulse: (!) 106  Weight:  193 lb (87.5 kg)  Height: 5\' 11"  (1.803 m)    Body mass index is 26.92 kg/m. Wt Readings from Last 3 Encounters:  06/15/22 193 lb (87.5 kg)  06/07/22 194 lb 12.8 oz (88.4 kg)  04/22/22 203 lb 9.6 oz (92.4 kg)   Patient is in no distress; well developed, nourished and groomed; neck is supple, has to stop mid sentence to take a breath  EYES: Visual fields full to confrontation, Extraocular movements intacts,   MUSCULOSKELETAL: Gait, strength, tone, movements noted in Neurologic exam below  NEUROLOGIC: MENTAL STATUS:      No data to display         awake, alert, oriented to person, place and time recent and remote memory intact normal  attention and concentration language fluent, comprehension intact, naming intact fund of knowledge appropriate  CRANIAL NERVE:  2nd, 3rd, 4th, 6th - visual fields full to confrontation, extraocular muscles intact, no nystagmus 5th - facial sensation symmetric 7th - facial strength symmetric 8th - hearing intact 9th - palate elevates symmetrically, uvula midline 11th - shoulder shrug symmetric 12th - tongue protrusion midline  MOTOR:  normal bulk and tone, full strength in the BUE, BLE  SENSORY:  Decrease light touch, pinprick and vibration all the way to ankle bilaterally. He also has absent proprioception.   COORDINATION:  finger-nose-finger, fine finger movements normal   GAIT   Ambulates with a cane    DIAGNOSTIC DATA (LABS, IMAGING, TESTING) - I reviewed patient records, labs, notes, testing and imaging myself where available.  Lab Results  Component Value Date   WBC 9.5 04/20/2022   HGB 16.2 04/20/2022   HCT 47.4 04/20/2022   MCV 96.1 04/20/2022   PLT 268.0 04/20/2022      Component Value Date/Time   NA 134 (L) 04/20/2022 1145   NA 137 03/05/2020 1343   K 4.4 04/20/2022 1145   CL 100 04/20/2022 1145   CO2 25 04/20/2022 1145   GLUCOSE 149 (H) 04/20/2022 1145   BUN 17 04/20/2022 1145   BUN 14 03/05/2020 1343   CREATININE 0.95 04/20/2022 1145   CREATININE 0.92 09/02/2021 1541   CALCIUM 9.3 04/22/2022 1122   PROT 7.9 04/20/2022 1145   PROT 6.9 04/20/2019 0807   ALBUMIN 4.2 04/20/2022 1145   ALBUMIN 4.3 04/20/2019 0807   AST 18 04/20/2022 1145   ALT 22 04/20/2022 1145   ALKPHOS 81 04/22/2022 1122   BILITOT 0.4 04/20/2022 1145   BILITOT 0.5 04/20/2019 0807   GFRNONAA >60 08/28/2021 0443   GFRAA 81 03/05/2020 1343   Lab Results  Component Value Date   CHOL 144 12/30/2021   HDL 25.00 (L) 12/30/2021   LDLCALC 58 04/03/2020   LDLDIRECT 80.0 12/30/2021   TRIG 342.0 (H) 12/30/2021   CHOLHDL 6 12/30/2021   Lab Results  Component Value Date   HGBA1C 5.9  12/30/2021   Lab Results  Component Value Date   [id number] 09/02/2021   Lab Results  Component Value Date   TSH 1.232 01/16/2021    Head CT 08/26/21:  1. No CT evidence for acute intracranial abnormality. 2. Atrophy and chronic small vessel ischemic changes of the white matter  MRI Brain 08/25/21 1. No acute intracranial abnormality.  2. Findings of chronic small vessel ischemia and generalized cerebral volume loss  Lumbar spine MRI 12/11/21 1.  At L2-L3, there is severe loss of disc height and other degenerative change causing moderately severe right lateral recess stenosis and moderate right foraminal narrowing.  There is potential for right L3 nerve root compression.  No spinal stenosis. 2.  At L3-L4, there is severe loss of disc height and other degenerative change causing moderately severe left lateral recess stenosis with potential for left L4 nerve root compression. 3.  At L4-L5, there are degenerative changes including severe facet hypertrophy causing mild spinal stenosis in the transverse diameter and moderately severe left foraminal and left lateral recess stenosis.  There is potential for left L4 or L5 nerve root compression. 4.  Milder degenerative changes at L1-L2 and L5-S1 do not lead to spinal stenosis or nerve root compression.   LTM EEG  This study is within normal limits. 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 year old male with multiple medical conditions including hypertension, hyperlipidemia, atrial fibrillation, COPD, pulmonary fibrosis who is presenting for follow up for his neuropathic pain.  Pregabalin has been very helpful in controlling his pain but he developed weight gain which compromised his respiratory status (pulmonary fibrosis).  Pregabalin discontinued, patient put on  amitriptyline but it was not helpful to control his pain. We also tried him on Lidocaine cream, report that it is not helpful. He made lifestyle changes and lost 15 lbs and would like to go back to Pregabalin.  He is not interested in spinal cord stimulator for peripheral neuropathy.  I have inform patient that I will give him a trial of Pregabalin, 30 days supply at time, and I will inform his pulmonologist. I will have to monitor his weight and if he develops weight gain that will compromise his breathing status, we will have to switch medication. He voices understanding and reports that he is working toward losing more weight.  Again, he is not interested in spinal cord stimulator   1. Peripheral polyneuropathy   2. Small fiber neuropathy   3. Mild cognitive impairment   4. IPF (idiopathic pulmonary fibrosis) ([place])       Patient Instructions  Restart Pregabalin 150 mg twice daily  Continue with Ambien 5 mg nightly  Continue with your other medications  Follow up in 3 months or sooner if worse    No orders of the defined types were placed in this encounter.   Meds ordered this encounter  Medications   zolpidem (AMBIEN) 5 MG tablet    Sig: Take 1 tablet (5 mg total) by mouth at bedtime as needed for sleep.    Dispense:  30 tablet    Refill:  5    Return in about 3 months (around 09/15/2022).  I have spent a total of 30 minutes dedicated to this patient today, preparing to see patient, performing a medically appropriate examination and evaluation, ordering tests and/or medications and procedures, and counseling and educating the patient/family/caregiver; independently interpreting result and communicating results to the family/patient/caregiver; and documenting clinical information in the electronic medical record.  Alric Ran, MD 06/15/2022, 4:46 PM  [facility] [address], [place], [place] [address] [phone]

## 2022-06-23 ENCOUNTER — Telehealth: Payer: Self-pay | Admitting: Internal Medicine

## 2022-06-23 DIAGNOSIS — J84112 Idiopathic pulmonary fibrosis: Secondary | ICD-10-CM

## 2022-06-23 DIAGNOSIS — K080 Exfoliation of teeth due to systemic causes: Secondary | ICD-10-CM | POA: Diagnosis not present

## 2022-06-23 NOTE — Telephone Encounter (Signed)
Wife calling to have husbands Esbriet called/sent in to [facility].  Wife's @ [phone]

## 2022-06-24 MED ORDER — PIRFENIDONE 801 MG PO TABS: 801.0000 mg | ORAL_TABLET | Freq: Three times a day (TID) | ORAL | 1 refills | Status: DC

## 2022-06-24 NOTE — Telephone Encounter (Signed)
Calling because they are needing Esbriet called into [facility].   Thank you

## 2022-06-24 NOTE — Telephone Encounter (Addendum)
Refill for Esbriet sent to [facility]  LFTs on 04/20/2022 wnl  Patinet has been advised via call today  Knox Saliva, PharmD, MPH, BCPS, CPP Clinical Pharmacist (Rheumatology and Pulmonology)

## 2022-06-28 ENCOUNTER — Ambulatory Visit (INDEPENDENT_AMBULATORY_CARE_PROVIDER_SITE_OTHER): Payer: Medicare Other | Admitting: Behavioral Health

## 2022-06-28 ENCOUNTER — Encounter: Payer: Self-pay | Admitting: Behavioral Health

## 2022-06-28 DIAGNOSIS — F32A Depression, unspecified: Secondary | ICD-10-CM | POA: Diagnosis not present

## 2022-06-28 DIAGNOSIS — G4700 Insomnia, unspecified: Secondary | ICD-10-CM

## 2022-06-28 DIAGNOSIS — F411 Generalized anxiety disorder: Secondary | ICD-10-CM | POA: Diagnosis not present

## 2022-06-28 MED ORDER — TRAZODONE HCL 50 MG PO TABS: 50.0000 mg | ORAL_TABLET | Freq: Every evening | ORAL | 3 refills | Status: DC | PRN

## 2022-06-28 MED ORDER — BUSPIRONE HCL 15 MG PO TABS: 15.0000 mg | ORAL_TABLET | Freq: Two times a day (BID) | ORAL | 3 refills | Status: DC

## 2022-06-28 MED ORDER — BUPROPION HCL ER (XL) 300 MG PO TB24: 300.0000 mg | ORAL_TABLET | Freq: Every day | ORAL | 1 refills | Status: DC

## 2022-06-28 MED ORDER — SERTRALINE HCL 100 MG PO TABS: 150.0000 mg | ORAL_TABLET | Freq: Every day | ORAL | 1 refills | Status: DC

## 2022-06-28 NOTE — Progress Notes (Signed)
[facility]  Patient ID: Robert Lang,  MRN: [id number]  PCP: Robert Lang  Date of Evaluation: 06/28/2022 Time spent:30 minutes  Chief Complaint:  Chief Complaint   Anxiety; Medication Problem; Medication Refill; Follow-up; Patient Education     HISTORY/CURRENT STATUS: HPI  72 yo Robert Lang male presents for follow up and medication management.   Says he has noticed some improvement since increasing Zoloft.  Requesting no medication changes. Sleep is ok but utilizing Ambien and Trazodone. Sleeps with C-pap.  Rates anxiety 5/10 and depression 5/10. Denies hx of mania, no psychosis. No auditory or visual hallucinations. SI or HI   No prior prior psychiatric medication trials Individual Medical History/ Review of Systems: Changes? :No   Allergies: Naproxen and Silodosin  Current Medications:  Current Outpatient Medications:    atorvastatin (LIPITOR) 80 MG tablet, Take 1 tablet (80 mg total) by mouth daily., Disp: 90 tablet, Rfl: 2   buPROPion (WELLBUTRIN XL) 300 MG 24 hr tablet, Take 1 tablet (300 mg total) by mouth daily., Disp: 90 tablet, Rfl: 1   busPIRone (BUSPAR) 15 MG tablet, Take 1 tablet (15 mg total) by mouth 2 (two) times daily., Disp: 60 tablet, Rfl: 3   Cholecalciferol 25 MCG (1000 UT) tablet, Take 1,000 Units by mouth daily. , Disp: , Rfl:    docusate sodium (COLACE) 100 MG capsule, Take 100 mg by mouth daily., Disp: , Rfl:    famotidine (PEPCID) 40 MG tablet, TAKE 1 TABLET(40 MG) BY MOUTH AT BEDTIME (Patient taking differently: Take 40 mg by mouth at bedtime.), Disp: 90 tablet, Rfl: 2   fexofenadine (ALLEGRA) 180 MG tablet, Take 180 mg by mouth at bedtime. , Disp: , Rfl:    finasteride (PROSCAR) 5 MG tablet, Take 1 tablet (5 mg total) by mouth daily., Disp: 90 tablet, Rfl: 1   Ginger, Zingiber officinalis, (GINGER ROOT) 550 MG CAPS, Take 550 mg by mouth daily., Disp: , Rfl:    Lidocaine 4 % LOTN, Use as needed in the bilateral lower extremities  (Patient taking differently: Apply 1 application  topically as needed (LE pain). Use as needed in the bilateral lower extremities), Disp: 88 mL, Rfl: 0   melatonin 5 MG TABS, Take 10 mg by mouth at bedtime., Disp: , Rfl:    Multiple Vitamins-Minerals (PRESERVISION AREDS PO), Take 1 capsule by mouth in the morning and at bedtime., Disp: , Rfl:    nitroGLYCERIN (NITROSTAT) 0.4 MG SL tablet, Place 1 tablet (0.4 mg total) under the tongue every 5 (five) minutes as needed for chest pain., Disp: 25 tablet, Rfl: 3   pantoprazole (PROTONIX) 40 MG tablet, Take 1 tablet (40 mg total) by mouth daily., Disp: 90 tablet, Rfl: 3   Pirfenidone (ESBRIET) 801 MG TABS, Take 1 tablet (801 mg total) by mouth 3 (three) times daily., Disp: 270 tablet, Rfl: 1   Polyethylene Glycol 3350 (MIRALAX PO), Take 17 g by mouth daily., Disp: , Rfl:    ranolazine (RANEXA) 1000 MG SR tablet, Take 1 tablet (1,000 mg total) by mouth 2 (two) times daily., Disp: 180 tablet, Rfl: 1   sertraline (ZOLOFT) 100 MG tablet, Take 1.5 tablets (150 mg total) by mouth at bedtime., Disp: 135 tablet, Rfl: 1   traZODone (DESYREL) 50 MG tablet, Take 1 tablet (50 mg total) by mouth at bedtime as needed for sleep (May take 1/2 tab to 1 tablet for sleep, as needed.)., Disp: 30 tablet, Rfl: 3   vitamin B-12 (CYANOCOBALAMIN) 1000 MCG tablet, Take 1,000 mcg  by mouth daily., Disp: , Rfl:    Vitamin D, Ergocalciferol, (DRISDOL) 1.25 MG (50000 UNIT) CAPS capsule, Take 1 capsule (50,000 Units total) by mouth every 7 (seven) days., Disp: 8 capsule, Rfl: 0   XARELTO 20 MG TABS tablet, TAKE 1 TABLET BY MOUTH DAILY WITH SUPPER, Disp: 30 tablet, Rfl: 5   zolpidem (AMBIEN) 5 MG tablet, Take 1 tablet (5 mg total) by mouth at bedtime as needed for sleep., Disp: 30 tablet, Rfl: 5 Medication Side Effects: none  Family Medical/ Social History: Changes? No  MENTAL HEALTH EXAM:  There were no vitals taken for this visit.There is no height or weight on file to calculate  BMI.  General Appearance: Casual, Neat, and Well Groomed  Eye Contact:  Good  Speech:  Clear and Coherent  Volume:  Normal  Mood:  NA  Affect:  Appropriate  Thought Process:  Coherent  Orientation:  Full (Time, Place, and Person)  Thought Content: Logical   Suicidal Thoughts:  No  Homicidal Thoughts:  No  Memory:  WNL  Judgement:  Good  Insight:  Good  Psychomotor Activity:  Normal  Concentration:  Concentration: Good  Recall:  Good  Fund of Knowledge: Good  Language: Good  Assets:  Desire for Improvement  ADL's:  Intact  Cognition: WNL  Prognosis:  Good    DIAGNOSES:    ICD-10-CM   1. Depressive disorder  F32.A buPROPion (WELLBUTRIN XL) 300 MG 24 hr tablet    sertraline (ZOLOFT) 100 MG tablet    2. Generalized anxiety disorder  F41.1 buPROPion (WELLBUTRIN XL) 300 MG 24 hr tablet    sertraline (ZOLOFT) 100 MG tablet    busPIRone (BUSPAR) 15 MG tablet    3. Insomnia, unspecified type  G47.00 traZODone (DESYREL) 50 MG tablet      Receiving Psychotherapy: No    RECOMMENDATIONS:   Greater than 50% of  30 min face to face time with patient was spent on counseling and coordination of care. We discussed  and reviewed his previous poc and medications. Educated on good sleep hygiene. He has IPF  which limits his activity. SOB when talking too much. He was previously seeing Robert Lang. He would like to consider increasing his AD.  We agreed to: To continue Zoloft to 150 mg daily To continue Trazodone 50 mg at bedtime To continue Buspar 15 mg twice daily To continue Wellbutrin 300 mg XL daily Will report worsening symptoms or side effects promptly To follow up in 8 weeks to reassess Provided emergency contact information Reviewed PDMP    Robert Flores, NP

## 2022-07-01 ENCOUNTER — Other Ambulatory Visit: Payer: Self-pay | Admitting: Gastroenterology

## 2022-07-01 DIAGNOSIS — K219 Gastro-esophageal reflux disease without esophagitis: Secondary | ICD-10-CM

## 2022-07-01 DIAGNOSIS — R1011 Right upper quadrant pain: Secondary | ICD-10-CM

## 2022-07-01 DIAGNOSIS — K2970 Gastritis, unspecified, without bleeding: Secondary | ICD-10-CM

## 2022-07-09 ENCOUNTER — Ambulatory Visit (INDEPENDENT_AMBULATORY_CARE_PROVIDER_SITE_OTHER): Payer: Medicare Other | Admitting: Medical

## 2022-07-09 VITALS — BP 102/60 | HR 78 | Temp 98.0°F | Resp 18 | Ht 71.0 in | Wt 201.6 lb

## 2022-07-09 DIAGNOSIS — F32A Depression, unspecified: Secondary | ICD-10-CM | POA: Diagnosis not present

## 2022-07-09 DIAGNOSIS — F419 Anxiety disorder, unspecified: Secondary | ICD-10-CM

## 2022-07-09 DIAGNOSIS — E559 Vitamin D deficiency, unspecified: Secondary | ICD-10-CM | POA: Diagnosis not present

## 2022-07-09 DIAGNOSIS — J84112 Idiopathic pulmonary fibrosis: Secondary | ICD-10-CM

## 2022-07-09 DIAGNOSIS — G629 Polyneuropathy, unspecified: Secondary | ICD-10-CM | POA: Diagnosis not present

## 2022-07-09 NOTE — Patient Instructions (Addendum)
1. Vitamin D deficiency Get vit d level today after recent 8 weeks vit D.  Then advise on what dose to use going forward.  2. Depression and Anxiety To continue Zoloft to 150 mg daily To continue Trazodone 50 mg at bedtime To continue Buspar 15 mg twice daily To continue Wellbutrin 300 mg XL daily  You report agitation when grandchildren come over. You could ask psychiatrist office if you can take buspar three times daily on day grandchildren come over.  3. Neuropathy Continue lyrica per neurologist.  4. IPF (idiopathic pulmonary fibrosis)  IPF. Followed pulmonologist. Robert Lang  three times daily   Follow up 3 months or sooner if needed.

## 2022-07-09 NOTE — Progress Notes (Signed)
Subjective:    Patient ID: Robert Lang, male    DOB: October 14, 1950, 72 y.o.   MRN: [id number]  HPI  Pt in for follow up.  He updates me that he ran out of vit tabs. Has been on vit D for about 2 months. He wants to get repeat level.  Pt has lower ext neuropathy. Pt states has seen neurologist and was given lyrivca that worked for neuropathy but he thinks caused him to gain weight.   Pt pulmonologist dc'd med that neurologist gave him and something else was tried but did not work.  He states went back on lyrica per neurologist. 150 mg twice a day on 06-15-2022. Pt tells me he talked with neurologist who put him back on med.  Hx of depression. Pt is seeing psychiatrist. Pt allowed me to enter chart of psychiatrist.  To continue Zoloft to 150 mg daily To continue Trazodone 50 mg at bedtime To continue Buspar 15 mg twice daily To continue Wellbutrin 300 mg XL daily Will report worsening symptoms or side effects promptly To follow up in 8 weeks to reassess   Pt states overall mood stable but sometimes gets overly agitated if grandkids come over.   IPF. Followed pulmonologist. Ricki Rodriguez  three times daily    Review of Systems  Constitutional:  Negative for chills, fatigue and fever.  HENT:  Negative for congestion, ear pain and nosebleeds.   Respiratory:  Negative for cough, chest tightness, shortness of breath and wheezing.   Cardiovascular:  Negative for chest pain and palpitations.  Gastrointestinal:  Negative for abdominal pain.  Genitourinary:  Negative for dysuria, flank pain and frequency.  Musculoskeletal:  Negative for back pain, myalgias and neck pain.  Skin:  Negative for rash.  Neurological:  Negative for dizziness, syncope, weakness, light-headedness and numbness.  Hematological:  Negative for adenopathy. Does not bruise/bleed easily.  Psychiatric/Behavioral:  Positive for dysphoric mood. Negative for behavioral problems, decreased concentration, sleep  disturbance and suicidal ideas. The patient is nervous/anxious.     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 M75.00)   Allergic rhinitis due to pollen 09/03/2013   J30.1)  Formatting of this note might be different from the original. J30.1)   Allergy    Anticoagulated 07/01/2016   Anxiety    Arthritis    Atrial fibrillation    Atrial flutter 06/26/2015   CAD (coronary artery disease)    Chest pain 06/26/2015   COPD (chronic obstructive pulmonary disease)    Coronary artery disease 07/06/2018   Cardiac catheterization 2017 showing 90% small diagonal branch disease   DDD (degenerative disc disease), cervical 09/03/2013   M50.90)  Formatting of this note might be different from the original. M50.90)   Depression    Depression    Depressive disorder 09/03/2013   Dizziness 10/28/2017   Dyslipidemia, goal LDL below 70 07/06/2018   Dyspnea on exertion 10/20/2018   Esophageal reflux 09/03/2013   Essential hypertension 07/06/2018   ETOH abuse 01/11/2019   6 pack of beer per day   Falls 10/28/2017   GERD (gastroesophageal reflux disease)    H/O amiodarone therapy 07/01/2016   Heart attack    Heart palpitations 01/27/2017   History of colon polyps    Hyperlipidemia    Hypertension    IPF (idiopathic pulmonary fibrosis)  Kidney stones    Neck pain 09/03/2013   Neuropathy 07/06/2018   Obstructive sleep apnea 08/05/2015   PLMD (periodic limb movement disorder) 11/04/2015   Polycythemia, 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 STORY: Due to alcohol/ tobacco   Pure hypercholesterolemia 09/03/2013   E78.0)  Formatting of this note might be different from the original. E78.0)   Screening for prostate cancer  09/03/2013   Smoker 01/11/2019   Smoking 07/06/2018   Status post ablation of atrial flutter 07/06/2018   2017     Social History   Socioeconomic History   Marital status: Married    Spouse name: Not on file   Number of children: Not on file   Years of education: Not on file   Highest education level: Not on file  Occupational History   Not on file  Tobacco Use   Smoking status: Former    Packs/day: 1.00    Years: 30.00    Additional pack years: 0.00    Total pack years: 30.00    Types: Cigarettes    Quit date: 01/10/2019    Years since quitting: 3.4   Smokeless tobacco: Former  Building services engineer Use: Never used  Substance and Sexual Activity   Alcohol use: Yes    Comment: 8oz of wine a day   Drug use: Never   Sexual activity: Not on file  Other Topics Concern   Not on file  Social History Narrative   Not on file   Social Determinants of Health   Financial Resource Strain: Low Risk  (12/22/2021)   Overall Financial Resource Strain (CARDIA)    Difficulty of Paying Living Expenses: Not hard at all  Food Insecurity: No Food Insecurity (12/22/2021)   Hunger Vital Sign    Worried About Running Out of Food in the Last Year: Never true    Ran Out of Food in the Last Year: Never true  Transportation Needs: No Transportation Needs (12/22/2021)   PRAPARE - Administrator, Civil Service (Medical): No    Lack of Transportation (Non-Medical): No  Physical Activity: Inactive (12/22/2021)   Exercise Vital Sign    Days of Exercise per Week: 0 days    Minutes of Exercise per Session: 0 min  Stress: No Stress Concern Present (12/22/2021)   [employer] of Occupational Health - Occupational Stress Questionnaire    Feeling of Stress : Not at all  Social Connections: Moderately Integrated (12/22/2021)   Social Connection and Isolation Panel [NHANES]    Frequency of Communication with Friends and Family: More than three times a week    Frequency of Social  Gatherings with Friends and Family: More than three times a week    Attends Religious Services: More than 4 times per year    Active Member of [employer] or Organizations: No    Attends Banker Meetings: Never    Marital Status: Married  Catering manager Violence: Not At Risk (12/22/2021)   Humiliation, Afraid, Rape, and Kick questionnaire    Fear of Current or Ex-Partner: No    Emotionally Abused: No    Physically Abused: No    Sexually Abused: No    Past Surgical History:  Procedure Laterality Date   ATRIAL FIBRILLATION ABLATION  10/2015   BACK SURGERY     CARDIOVERSION  2017   CATARACT EXTRACTION Bilateral 2017   March and April 2017  COLONOSCOPY  2018   CORONARY STENT PLACEMENT  2012   CORONARY/GRAFT ACUTE MI REVASCULARIZATION N/A 01/10/2019   Procedure: CORONARY/GRAFT ACUTE MI REVASCULARIZATION;  Surgeon: Marykay Lex, MD;  [facility] INVASIVE CV LAB;  Service: Cardiovascular;  Laterality: N/A;   INGUINAL HERNIA REPAIR     over 20 years ago   LAPAROSCOPIC APPENDECTOMY N/A 01/17/2021   Procedure: APPENDECTOMY LAPAROSCOPIC;  Surgeon: Quentin Ore, MD;  Location: [facility] OR;  Service: General;  Laterality: N/A;   LEFT HEART CATH AND CORONARY ANGIOGRAPHY N/A 01/10/2019   Procedure: LEFT HEART CATH AND CORONARY ANGIOGRAPHY;  Surgeon: Marykay Lex, MD;  Location: [facility] INVASIVE CV LAB;  Service: Cardiovascular;  Laterality: N/A;   LUMBAR LAMINECTOMY Bilateral 04/05/2016   L2-L5    VENTRAL HERNIA REPAIR  2018    Family History  Problem Relation Age of Onset   Anxiety disorder Mother    Alcohol abuse Father    Colon cancer Father    Depression Brother    Alcohol abuse Brother    Throat cancer Brother     Allergies  Allergen Reactions   Naproxen Other (See Comments)    (Naprosyn *ANALGESICS - ANTI-INFLAMMATORY*) Nausea, Abdominal pain   Silodosin Other (See Comments)    Low blood pressure    Current Outpatient Medications on File Prior to Visit   Medication Sig Dispense Refill   atorvastatin (LIPITOR) 80 MG tablet Take 1 tablet (80 mg total) by mouth daily. 90 tablet 2   buPROPion (WELLBUTRIN XL) 300 MG 24 hr tablet Take 1 tablet (300 mg total) by mouth daily. 90 tablet 1   busPIRone (BUSPAR) 15 MG tablet Take 1 tablet (15 mg total) by mouth 2 (two) times daily. 60 tablet 3   Cholecalciferol 25 MCG (1000 UT) tablet Take 1,000 Units by mouth daily.      docusate sodium (COLACE) 100 MG capsule Take 100 mg by mouth daily.     famotidine (PEPCID) 40 MG tablet TAKE 1 TABLET(40 MG) BY MOUTH AT BEDTIME (Patient taking differently: Take 40 mg by mouth at bedtime.) 90 tablet 2   fexofenadine (ALLEGRA) 180 MG tablet Take 180 mg by mouth at bedtime.      finasteride (PROSCAR) 5 MG tablet Take 1 tablet (5 mg total) by mouth daily. 90 tablet 1   Ginger, Zingiber officinalis, (GINGER ROOT) 550 MG CAPS Take 550 mg by mouth daily.     Lidocaine 4 % LOTN Use as needed in the bilateral lower extremities (Patient taking differently: Apply 1 application  topically as needed (LE pain). Use as needed in the bilateral lower extremities) 88 mL 0   melatonin 5 MG TABS Take 10 mg by mouth at bedtime.     Multiple Vitamins-Minerals (PRESERVISION AREDS PO) Take 1 capsule by mouth in the morning and at bedtime.     nitroGLYCERIN (NITROSTAT) 0.4 MG SL tablet Place 1 tablet (0.4 mg total) under the tongue every 5 (five) minutes as needed for chest pain. 25 tablet 3   pantoprazole (PROTONIX) 40 MG tablet TAKE 1 TABLET(40 MG) BY MOUTH DAILY 90 tablet 1   Pirfenidone (ESBRIET) 801 MG TABS Take 1 tablet (801 mg total) by mouth 3 (three) times daily. 270 tablet 1   Polyethylene Glycol 3350 (MIRALAX PO) Take 17 g by mouth daily.     ranolazine (RANEXA) 1000 MG SR tablet Take 1 tablet (1,000 mg total) by mouth 2 (two) times daily. 180 tablet 1   sertraline (ZOLOFT) 100 MG tablet Take 1.5  tablets (150 mg total) by mouth at bedtime. 135 tablet 1   traZODone (DESYREL) 50 MG  tablet Take 1 tablet (50 mg total) by mouth at bedtime as needed for sleep (May take 1/2 tab to 1 tablet for sleep, as needed.). 30 tablet 3   vitamin B-12 (CYANOCOBALAMIN) 1000 MCG tablet Take 1,000 mcg by mouth daily.     Vitamin D, Ergocalciferol, (DRISDOL) 1.25 MG (50000 UNIT) CAPS capsule Take 1 capsule (50,000 Units total) by mouth every 7 (seven) days. 8 capsule 0   XARELTO 20 MG TABS tablet TAKE 1 TABLET BY MOUTH DAILY WITH SUPPER 30 tablet 5   zolpidem (AMBIEN) 5 MG tablet Take 1 tablet (5 mg total) by mouth at bedtime as needed for sleep. 30 tablet 5   No current facility-administered medications on file prior to visit.    BP 102/60   Pulse 78   Temp 98 F (36.7 C)   Resp 18   Ht 5\' 11"  (1.803 m)   Wt 201 lb 9.6 oz (91.4 kg)   SpO2 92%   BMI 28.12 kg/m        Objective:   Physical Exam  General Mental Status- Alert. General Appearance- Not in acute distress.   Skin General: Color- Normal Color. Moisture- Normal Moisture.  Neck Carotid Arteries- Normal color. Moisture- Normal Moisture. No carotid bruits. No JVD.  Chest and Lung Exam Auscultation: Breath Sounds:-Normal.  Cardiovascular Auscultation:Rythm- Regular. Murmurs & Other Heart Sounds:Auscultation of the heart reveals- No Murmurs.  Abdomen Inspection:-Inspeection Normal. Palpation/Percussion:Note:No mass. Palpation and Percussion of the abdomen reveal- Non Tender, Non Distended + BS, no rebound or guarding.   Neurologic Cranial Nerve exam:- CN III-XII intact(No nystagmus), symmetric smile. Strength:- 5/5 equal and symmetric strength both upper and lower extremities.    Lower ext- no pedal edema. Calfs symmetric     Assessment & Plan:   Patient Instructions  1. Vitamin D deficiency Get vit d level today after recent 8 weeks vit D.   2. Depression and Anxiety To continue Zoloft to 150 mg daily To continue Trazodone 50 mg at bedtime To continue Buspar 15 mg twice daily To continue  Wellbutrin 300 mg XL daily  You report agitation when grandchildren come over. You could ask psychiatrist office if you can take buspar three times daily on day grandchildren come over.  3. Neuropathy Continue lyrica per neurologist.  4. IPF (idiopathic pulmonary fibrosis)  IPF. Followed pulmonologist. Ricki Rodriguez 801mg  three times daily   Follow up 3 months or sooner if needed.   [name], [name]

## 2022-07-10 LAB — VITAMIN D 25 HYDROXY (VIT D DEFICIENCY, FRACTURES): Vit D, 25-Hydroxy: 64 ng/mL (ref 30–100)

## 2022-07-14 ENCOUNTER — Other Ambulatory Visit: Payer: Self-pay | Admitting: Neurology

## 2022-07-14 NOTE — Telephone Encounter (Signed)
Requested Prescriptions   Pending Prescriptions Disp Refills   pregabalin (LYRICA) 150 MG capsule [Pharmacy Med Name: PREGABALIN  CAPSULES] 90 capsule     Sig: TAKE 1 CAPSULE(150 MG) BY MOUTH THREE TIMES DAILY   Last seen 06/15/22, next appt 09/15/22. Lyrica was not on last med list... Refusing refill

## 2022-07-15 ENCOUNTER — Other Ambulatory Visit: Payer: Self-pay | Admitting: Neurology

## 2022-07-15 ENCOUNTER — Telehealth: Payer: Self-pay | Admitting: Neurology

## 2022-07-15 MED ORDER — PREGABALIN 150 MG PO CAPS: 150.0000 mg | ORAL_CAPSULE | Freq: Two times a day (BID) | ORAL | 5 refills | Status: DC

## 2022-07-15 NOTE — Telephone Encounter (Signed)
Refill sent.

## 2022-07-15 NOTE — Telephone Encounter (Signed)
Called and spoke to pt and stated that the refill has been sent

## 2022-07-15 NOTE — Telephone Encounter (Signed)
Pt called stating that he is needing a refill on his pregabalin to the [facility] in [place].

## 2022-07-15 NOTE — Telephone Encounter (Signed)
Dr. Teresa Coombs,  In your last note your stated,  Restart Pregabalin 150 mg twice daily     I do not see this on the med list and that is the only way that I can check the registry

## 2022-07-21 ENCOUNTER — Other Ambulatory Visit: Payer: Medicare Other

## 2022-08-11 ENCOUNTER — Other Ambulatory Visit: Payer: Self-pay | Admitting: Cardiology

## 2022-08-17 ENCOUNTER — Telehealth: Payer: Self-pay | Admitting: Internal Medicine

## 2022-08-17 MED ORDER — PREDNISONE 10 MG PO TABS: ORAL_TABLET | ORAL | 0 refills | Status: AC

## 2022-08-17 NOTE — Telephone Encounter (Signed)
Called and spoke with both patient and his wife. They both verbalized understanding. RX has been sent for prednisone.   Nothing further needed at time of call.

## 2022-08-17 NOTE — Telephone Encounter (Signed)
Called and spoke with both patient and his wife [name]. [name] stated that the patient's O2 levels have been fluctuating over the past week. His normal range is 91%-95% on room air. He has seen his O2 drop to 85% twice since 05/27. He checked his O2 twice this morning and it was at 92% around 715am and 93% around 9am.  Per the patient, he has noticed an increase in coughing and wheezing but he has not been able to cough up any phlegm. He denied any fever or body aches. Denied any recent sick exposures.   I attempted to get patient scheduled for an appt but the at the time of call, we did not have any openings.   MW, can you please advise since MR is off today? Thanks!

## 2022-08-17 NOTE — Telephone Encounter (Signed)
Please call wife. PT's O2 dropping.   May 25th 91,92,88         26th 92,85,92,89,88  All these are at rest.  She is concerned with this fluctuation. Would like a nurse to call. He is also tired and irritable.   [phone]

## 2022-08-17 NOTE — Telephone Encounter (Signed)
Can adjust 02 flow to keep sats > 90%   If has responded to prednisone in past ok to use Prednisone 10 mg take  4 each am x 2 days,   2 each am x 2 days,  1 each am x 2 days and stop and let us know by end of week if not at lot better > to ER in meantime if worse

## 2022-08-20 ENCOUNTER — Ambulatory Visit: Payer: Medicare Other | Admitting: Behavioral Health

## 2022-08-20 ENCOUNTER — Telehealth: Payer: Self-pay | Admitting: Behavioral Health

## 2022-08-20 ENCOUNTER — Encounter: Payer: Self-pay | Admitting: Behavioral Health

## 2022-08-20 DIAGNOSIS — F32A Depression, unspecified: Secondary | ICD-10-CM | POA: Diagnosis not present

## 2022-08-20 DIAGNOSIS — F411 Generalized anxiety disorder: Secondary | ICD-10-CM | POA: Diagnosis not present

## 2022-08-20 DIAGNOSIS — R451 Restlessness and agitation: Secondary | ICD-10-CM

## 2022-08-20 DIAGNOSIS — R454 Irritability and anger: Secondary | ICD-10-CM

## 2022-08-20 DIAGNOSIS — G4700 Insomnia, unspecified: Secondary | ICD-10-CM

## 2022-08-20 DIAGNOSIS — F39 Unspecified mood [affective] disorder: Secondary | ICD-10-CM

## 2022-08-20 MED ORDER — BUSPIRONE HCL 15 MG PO TABS: 15.0000 mg | ORAL_TABLET | Freq: Three times a day (TID) | ORAL | 3 refills | Status: DC

## 2022-08-20 MED ORDER — LAMOTRIGINE 25 MG PO TABS: ORAL_TABLET | ORAL | 1 refills | Status: DC

## 2022-08-20 MED ORDER — BUPROPION HCL ER (XL) 300 MG PO TB24: 300.0000 mg | ORAL_TABLET | Freq: Every day | ORAL | 1 refills | Status: DC

## 2022-08-20 MED ORDER — LORAZEPAM 0.5 MG PO TABS: ORAL_TABLET | ORAL | 1 refills | Status: DC

## 2022-08-20 NOTE — Progress Notes (Signed)
[facility]  Patient ID: Robert Lang,  MRN: [id number]  PCP: [name], [name]  Date of Evaluation: 08/20/2022 Time spent:30 minutes  Chief Complaint:  Chief Complaint   Anxiety; Depression; Follow-up; Patient Education; Medication Refill; Family Problem; Irritability     HISTORY/CURRENT STATUS: HPI  72 yo Robert Lang male presents for follow up and medication management. His wife was with him today with his consent.  Says he has noticed some improvement since increasing Zoloft but still is having irritability, agitation, and getting angry about small things. Says it is effecting relationship with wife ad family. He is requesting medication to help with moods. Sleep is ok but utilizing Ambien and Trazodone. Sleeps with C-pap.  Rates anxiety 5/10 and depression 4/10. Denies hx of mania, no psychosis. No auditory or visual hallucinations. SI or HI      Individual Medical History/ Review of Systems: Changes? :No   Allergies: Naproxen and Silodosin  Current Medications:  Current Outpatient Medications:    lamoTRIgine (LAMICTAL) 25 MG tablet, Take one tablet by mouth 25 mg daily for 14 days, then take two tablets 50 mg total daily., Disp: 60 tablet, Rfl: 1   LORazepam (ATIVAN) 0.5 MG tablet, Take one tablet by mouth only for severe anxiety or agitation, Disp: 30 tablet, Rfl: 1   pregabalin (LYRICA) 150 MG capsule, Take 1 capsule (150 mg total) by mouth 2 (two) times daily., Disp: 90 capsule, Rfl: 5   atorvastatin (LIPITOR) 80 MG tablet, Take 1 tablet (80 mg total) by mouth daily., Disp: 90 tablet, Rfl: 2   buPROPion (WELLBUTRIN XL) 300 MG 24 hr tablet, Take 1 tablet (300 mg total) by mouth daily., Disp: 90 tablet, Rfl: 1   busPIRone (BUSPAR) 15 MG tablet, Take 1 tablet (15 mg total) by mouth 3 (three) times daily., Disp: 90 tablet, Rfl: 3   Cholecalciferol 25 MCG (1000 UT) tablet, Take 1,000 Units by mouth daily. , Disp: , Rfl:    docusate sodium (COLACE) 100 MG  capsule, Take 100 mg by mouth daily., Disp: , Rfl:    famotidine (PEPCID) 40 MG tablet, TAKE 1 TABLET(40 MG) BY MOUTH AT BEDTIME, Disp: 90 tablet, Rfl: 2   fexofenadine (ALLEGRA) 180 MG tablet, Take 180 mg by mouth at bedtime. , Disp: , Rfl:    finasteride (PROSCAR) 5 MG tablet, Take 1 tablet (5 mg total) by mouth daily., Disp: 90 tablet, Rfl: 1   Ginger, Zingiber officinalis, (GINGER ROOT) 550 MG CAPS, Take 550 mg by mouth daily., Disp: , Rfl:    Lidocaine 4 % LOTN, Use as needed in the bilateral lower extremities (Patient taking differently: Apply 1 application  topically as needed (LE pain). Use as needed in the bilateral lower extremities), Disp: 88 mL, Rfl: 0   melatonin 5 MG TABS, Take 10 mg by mouth at bedtime., Disp: , Rfl:    Multiple Vitamins-Minerals (PRESERVISION AREDS PO), Take 1 capsule by mouth in the morning and at bedtime., Disp: , Rfl:    nitroGLYCERIN (NITROSTAT) 0.4 MG SL tablet, Place 1 tablet (0.4 mg total) under the tongue every 5 (five) minutes as needed for chest pain., Disp: 25 tablet, Rfl: 3   pantoprazole (PROTONIX) 40 MG tablet, TAKE 1 TABLET(40 MG) BY MOUTH DAILY, Disp: 90 tablet, Rfl: 1   Pirfenidone (ESBRIET) 801 MG TABS, Take 1 tablet (801 mg total) by mouth 3 (three) times daily., Disp: 270 tablet, Rfl: 1   Polyethylene Glycol 3350 (MIRALAX PO), Take 17 g by mouth daily., Disp: ,  Rfl:    predniSONE (DELTASONE) 10 MG tablet, Take 4 tablets (40 mg total) by mouth daily with breakfast for 2 days, THEN 2 tablets (20 mg total) daily with breakfast for 2 days, THEN 1 tablet (10 mg total) daily with breakfast for 2 days., Disp: 14 tablet, Rfl: 0   ranolazine (RANEXA) 1000 MG SR tablet, Take 1 tablet (1,000 mg total) by mouth 2 (two) times daily., Disp: 180 tablet, Rfl: 1   sertraline (ZOLOFT) 100 MG tablet, Take 1.5 tablets (150 mg total) by mouth at bedtime., Disp: 135 tablet, Rfl: 1   traZODone (DESYREL) 50 MG tablet, Take 1 tablet (50 mg total) by mouth at bedtime as  needed for sleep (May take 1/2 tab to 1 tablet for sleep, as needed.)., Disp: 30 tablet, Rfl: 3   vitamin B-12 (CYANOCOBALAMIN) 1000 MCG tablet, Take 1,000 mcg by mouth daily., Disp: , Rfl:    Vitamin D, Ergocalciferol, (DRISDOL) 1.25 MG (50000 UNIT) CAPS capsule, Take 1 capsule (50,000 Units total) by mouth every 7 (seven) days., Disp: 8 capsule, Rfl: 0   XARELTO 20 MG TABS tablet, TAKE 1 TABLET BY MOUTH DAILY WITH SUPPER, Disp: 30 tablet, Rfl: 5   zolpidem (AMBIEN) 5 MG tablet, Take 1 tablet (5 mg total) by mouth at bedtime as needed for sleep., Disp: 30 tablet, Rfl: 5 Medication Side Effects: none  Family Medical/ Social History: Changes? No  MENTAL HEALTH EXAM:  There were no vitals taken for this visit.There is no height or weight on file to calculate BMI.  General Appearance: Casual, Neat, and Well Groomed  Eye Contact:  Good  Speech:  Clear and Coherent  Volume:  Normal  Mood:  Anxious  Affect:  Appropriate  Thought Process:  Coherent  Orientation:  Full (Time, Place, and Person)  Thought Content: Logical   Suicidal Thoughts:  No  Homicidal Thoughts:  No  Memory:  WNL  Judgement:  Good  Insight:  Good  Psychomotor Activity:  Normal  Concentration:  Concentration: Good  Recall:  Good  Fund of Knowledge: Good  Language: Good  Assets:  Desire for Improvement  ADL's:  Intact  Cognition: WNL  Prognosis:  Good    DIAGNOSES:    ICD-10-CM   1. Depressive disorder  F32.A buPROPion (WELLBUTRIN XL) 300 MG 24 hr tablet    2. Generalized anxiety disorder  F41.1 LORazepam (ATIVAN) 0.5 MG tablet    busPIRone (BUSPAR) 15 MG tablet    buPROPion (WELLBUTRIN XL) 300 MG 24 hr tablet    3. Insomnia, unspecified type  G47.00     4. Unspecified mood (affective) disorder (HCC)  F39 lamoTRIgine (LAMICTAL) 25 MG tablet    5. Agitation  R45.1 lamoTRIgine (LAMICTAL) 25 MG tablet    6. Irritability  R45.4 lamoTRIgine (LAMICTAL) 25 MG tablet      Receiving Psychotherapy: No     RECOMMENDATIONS:   Greater than 50% of 30 min face to face time with patient was spent on counseling and coordination of care. We discussed  and reviewed his previous poc and medications. He was requesting assistance with his severe mood swings, irritability and agitation today.    We agreed to: To continue Zoloft to 150 mg daily To continue Trazodone 50 mg at bedtime To increase  Buspar  to 15 mg three times daily To start Lamictal 25 mg once daily for two weeks, then 50 mg daily. To start Ativan 0.5 mg once daily prn only for severe anxiety or agitation. To continue Wellbutrin  300 mg XL daily Will report worsening symptoms or side effects promptly To follow up in 4 weeks to reassess Provided emergency contact information Discussed potential benefits, risk, and side effects of benzodiazepines to include potential risk of tolerance and dependence, as well as possible drowsiness.  Advised patient not to drive if experiencing drowsiness and to take lowest possible effective dose to minimize risk of dependence and tolerance.  Monitor for any sign of rash. Please taking Lamictal and contact office immediately rash develops. Recommend seeking urgent medical attention if rash is severe and/or spreading quickly.  Reviewed PDMP          Joan Flores, NP

## 2022-08-20 NOTE — Telephone Encounter (Signed)
[facility] sent PA Request for Lorazepam 0.5mg , see Fax.

## 2022-08-23 ENCOUNTER — Other Ambulatory Visit: Payer: Self-pay | Admitting: Cardiology

## 2022-08-23 ENCOUNTER — Ambulatory Visit: Payer: Medicare Other | Admitting: Behavioral Health

## 2022-08-23 DIAGNOSIS — I480 Paroxysmal atrial fibrillation: Secondary | ICD-10-CM

## 2022-08-23 NOTE — Telephone Encounter (Signed)
Prescription refill request for Xarelto received.  Indication: aflutter  Last office visit: Bing Matter, 06/07/2022 Weight: 91.4 kg  Age: 72 yo  Scr: 0.95, 04/20/2022 CrCl: 91 ml/min   Refill sent.

## 2022-08-27 ENCOUNTER — Telehealth: Payer: Self-pay | Admitting: Internal Medicine

## 2022-08-27 ENCOUNTER — Telehealth: Payer: Self-pay | Admitting: Medical

## 2022-08-27 ENCOUNTER — Telehealth: Payer: Self-pay | Admitting: *Deleted

## 2022-08-27 MED ORDER — PREDNISONE 10 MG PO TABS: ORAL_TABLET | ORAL | 0 refills | Status: DC

## 2022-08-27 NOTE — Telephone Encounter (Signed)
Contacted patient to confirm Pft- patient was having a very hard time breathing over the phone. Patient states he has had a lot of troubles breathing for the last 2 weeks. Asked patient if he was able to do the breathing test or if he just wanted to see MR- patient states he needs the breathing test. Please cal patient to discuss

## 2022-08-27 NOTE — Telephone Encounter (Signed)
Called Dr. Milagros Reap office [facility]) to speak with his CMA, she was rooming a patient at the time of my call.  I left my name and number for her to return my call.  Will await return call.

## 2022-08-27 NOTE — Telephone Encounter (Signed)
Received return call from [facility] CMA, Dahlia Client, she stated she did not see any orders for oxygen for patient.  Advised he has an appointment with [place] on Monday and we will walk him in the office at that time.  Nothing further needed.

## 2022-08-27 NOTE — Telephone Encounter (Signed)
I'll send in prednisone for possible acute flare up ILD. If oxygen levels improves into 90s with deep breathing and rest ok to monitor through the weekend. If sustaining <88% then present to ED. We can qualify him for oxygen at his visit on Monday.

## 2022-08-27 NOTE — Telephone Encounter (Signed)
Heather with [facility] calling to speak to CMA regarding this patient. Please call her [phone]

## 2022-08-27 NOTE — Telephone Encounter (Signed)
Primary Pulmonologist: Ramaswamy Last office visit and with whom: 04/20/2022 What do we see them for (pulmonary problems): IPF Last OV assessment/plan:  Assessment:         ICD-10-CM    1. IPF (idiopathic pulmonary fibrosis) (HCC)  J84.112       2. Medication monitoring encounter  Z51.81       3. Other fatigue  R53.83              Plan:     Patient Instructions  IPF (idiopathic pulmonary fibrosis) (HCC) Medication monitoring encounter   -Pulmonary fibrosis itself appears to be stable since December 2022 based on pulmonary function test and symptoms..  Overall tolerating Esbriet well.  Appreciated interested in clinical trials.   plan - check cbc, bmet,  LFT  04/20/2022 - continue esbriet per schedule -Take informed consent for beacon IPF study             -If you prequalify then we will try to schedule you in the next few months -Take informed consent form for [employer] bone study             -If you requalify then we will him to recruit you today - -Recheck spirometry and DLCO in 4 months     Fatigue/Anxiety/Depression   -Your fatigue could be because of pirfenidone.  It could also be because you are not using a CPAP.  It can also be because of many of the anxiety and depression and pain medications you are taking.  I do not think the fatigue is because of pulmonary fibrosis in itself.   Plan  - Talk to primary care physician  - Consider white light therapy every day for depression - Try to be more compliant with the CPAP     -Follow-up - 4 months Dr Marchelle Gearing but after PFT; symptom score and walk at followup             -30-minute visit   ( Level 05 visit: Estb 40-54 min   visit type: on-site physical face to visit  in total care time and counseling or/and coordination of care by this undersigned MD - Dr Kalman Shan. This includes one or more of the following on this same day 04/20/2022: pre-charting, chart review, note writing, documentation discussion of test  results, diagnostic or treatment recommendations, prognosis, risks and benefits of management options, instructions, education, compliance or risk-factor reduction. It excludes time spent by the CMA or office staff in the care of the patient. Actual time 55 min)     SIGNATURE      Dr. Kalman Shan, M.D., F.C.C.P,  Pulmonary and Critical Care Medicine Staff Physician, [facility] Center Director - Interstitial Lung Disease  Program  Pulmonary Fibrosis [facility] [place], [place], [address]   Pager: [phone], If no answer or between  15:00h - 7:00h: call [phone] Telephone: [phone]   11:08 AM 04/20/2022        Patient Instructions by Kalman Shan, MD at 04/20/2022 11:00 AM  Author: Kalman Shan, MD Author Type: Physician Filed: 04/20/2022 11:37 AM  Note Status: Addendum Cosign: Cosign Not Required Encounter Date: 04/20/2022  Editor: Kalman Shan, MD (Physician)      Prior Versions: 1. Kalman Shan, MD (Physician) at 04/20/2022 11:11 AM - Addendum   2. Kalman Shan, MD (Physician) at 04/20/2022 11:07 AM - Addendum   3. Kalman Shan, MD (Physician) at 04/20/2022 11:04 AM -  Addendum   4. Kalman Shan, MD (Physician) at 04/20/2022  6:15 AM - Signed  IPF (idiopathic pulmonary fibrosis) (HCC) Medication monitoring encounter   -Pulmonary fibrosis itself appears to be stable since December 2022 based on pulmonary function test and symptoms..  Overall tolerating Esbriet well.  Appreciated interested in clinical trials.   plan - check cbc, bmet,  LFT  04/20/2022 - continue esbriet per schedule -Take informed consent for beacon IPF study             -If you prequalify then we will try to schedule you in the next few months -Take informed consent form for [employer] bone study             -If you requalify then we will him to recruit you today - -Recheck spirometry and DLCO in 4 months      Fatigue/Anxiety/Depression   -Your fatigue could be because of pirfenidone.  It could also be because you are not using a CPAP.  It can also be because of many of the anxiety and depression and pain medications you are taking.  I do not think the fatigue is because of pulmonary fibrosis in itself.   Plan  - Talk to primary care physician  - Consider white light therapy every day for depression - Try to be more compliant with the CPAP   QT interval on EKG > in past   EKG 04/20/2022 - QtcF   Plan  - excludes you from [place] research studies  - have informed Dr Kirtland Bouchard your cardiologist     -Follow-up - 4 months Dr Marchelle Gearing but after PFT; symptom score and walk at followup             -30-minute visit       Orthostatic Vitals Recorded in This Encounter   04/20/2022 1015     BP Location: Left Arm  Cuff Size: Normal   Instructions  IPF (idiopathic pulmonary fibrosis) (HCC) Medication monitoring encounter   -Pulmonary fibrosis itself appears to be stable since December 2022 based on pulmonary function test and symptoms..  Overall tolerating Esbriet well.  Appreciated interested in clinical trials.   plan - check cbc, bmet,  LFT  04/20/2022 - continue esbriet per schedule -Take informed consent for beacon IPF study             -If you prequalify then we will try to schedule you in the next few months -Take informed consent form for [employer] bone study             -If you requalify then we will him to recruit you today - -Recheck spirometry and DLCO in 4 months     Fatigue/Anxiety/Depression   -Your fatigue could be because of pirfenidone.  It could also be because you are not using a CPAP.  It can also be because of many of the anxiety and depression and pain medications you are taking.  I do not think the fatigue is because of pulmonary fibrosis in itself.   Plan  - Talk to primary care physician  - Consider white light therapy every day for depression -  Try to be more compliant with the CPAP   QT interval on EKG > in past   EKG 04/20/2022 - QtcF   Plan  - excludes you from may research studies  - have informed Dr Kirtland Bouchard your cardiologist     -Follow-up - 4 months Dr Marchelle Gearing but after PFT; symptom score  and walk at followup             -30-minute visit      Was appointment offered to patient (explain)?  No none available, scheduled to see Dr. Marchelle Gearing on 6/10   Reason for call: Increase in sob for past 2 weeks, he is not on oxygen.  The highest his sats have been is 90% and lowest has been 85%.  He is on Esbriet 3 times per day.  No inhalers, no nebs.  Coughing up a little bit of clear mucous.  He is having wheezing.  He is having sob talking, sitting still and with exertion.  Someone from [place], a NP came out and said he needed oxygen and sent information to his PCP office.  His wife states this time last year his oxygen levels were in the 90's and now he is 85-92%.  Advised that if his oxygen levels continue to stay 88% and lower and his breathing continues to be labored or gets worse, he will likely need to be seen in the ER.  Advised I would get a message to one of our providers for recommendations and then one of [place] will call them back.  They verified understanding.  Beth, please advise.  Thank you.  (examples of things to ask: : When did symptoms start? Fever? Cough? Productive? Color to sputum? More sputum than usual? Wheezing? Have you needed increased oxygen? Are you taking your respiratory medications? What over the counter measures have you tried?)  Allergies  Allergen Reactions   Naproxen Other (See Comments)    (Naprosyn *ANALGESICS - ANTI-INFLAMMATORY*) Nausea, Abdominal pain   Silodosin Other (See Comments)    Low blood pressure    Immunization History  Administered Date(s) Administered   Influenza Split 01/09/2010, 02/09/2011   Influenza, High Dose Seasonal PF 11/15/2018, 01/11/2020, 12/27/2020,  12/24/2021   Influenza,inj,Quad PF,6+ Mos 01/01/2015   Influenza-Unspecified 01/30/2014, 02/02/2016, 12/15/2020   PFIZER(Purple Top)SARS-COV-2 Vaccination 04/27/2019, 05/18/2019, 01/14/2020, 07/22/2020, 12/15/2020, 12/24/2021   Pneumococcal Conjugate-13 11/23/2018   Pneumococcal Polysaccharide-23 08/12/2020   Respiratory Syncytial Virus Vaccine,Recomb Aduvanted(Arexvy) 12/17/2021   Tdap 02/18/2011

## 2022-08-27 NOTE — Telephone Encounter (Signed)
Spoke with Robert Lang made her aware our office hasn't seen any orders for oxygen

## 2022-08-27 NOTE — Telephone Encounter (Signed)
Called and spoke with patient. He verbalized understanding. ? ?Nothing further needed at time of call.  ?

## 2022-08-28 ENCOUNTER — Other Ambulatory Visit: Payer: Self-pay

## 2022-08-28 ENCOUNTER — Encounter (HOSPITAL_BASED_OUTPATIENT_CLINIC_OR_DEPARTMENT_OTHER): Payer: Self-pay

## 2022-08-28 ENCOUNTER — Inpatient Hospital Stay (HOSPITAL_BASED_OUTPATIENT_CLINIC_OR_DEPARTMENT_OTHER)
Admission: EM | Admit: 2022-08-28 | Discharge: 2022-09-01 | DRG: 190 | Disposition: A | Discharge status: Home / Self Care | Payer: Medicare Other | Attending: Internal Medicine | Admitting: Internal Medicine

## 2022-08-28 ENCOUNTER — Telehealth: Payer: Self-pay | Admitting: Psychiatry

## 2022-08-28 ENCOUNTER — Emergency Department (HOSPITAL_BASED_OUTPATIENT_CLINIC_OR_DEPARTMENT_OTHER): Payer: Medicare Other

## 2022-08-28 DIAGNOSIS — J449 Chronic obstructive pulmonary disease, unspecified: Secondary | ICD-10-CM | POA: Diagnosis present

## 2022-08-28 DIAGNOSIS — E871 Hypo-osmolality and hyponatremia: Secondary | ICD-10-CM | POA: Diagnosis present

## 2022-08-28 DIAGNOSIS — I10 Essential (primary) hypertension: Secondary | ICD-10-CM | POA: Diagnosis present

## 2022-08-28 DIAGNOSIS — J9811 Atelectasis: Secondary | ICD-10-CM | POA: Diagnosis not present

## 2022-08-28 DIAGNOSIS — Z87891 Personal history of nicotine dependence: Secondary | ICD-10-CM

## 2022-08-28 DIAGNOSIS — R0600 Dyspnea, unspecified: Secondary | ICD-10-CM | POA: Diagnosis not present

## 2022-08-28 DIAGNOSIS — D1803 Hemangioma of intra-abdominal structures: Secondary | ICD-10-CM | POA: Diagnosis not present

## 2022-08-28 DIAGNOSIS — K219 Gastro-esophageal reflux disease without esophagitis: Secondary | ICD-10-CM | POA: Diagnosis present

## 2022-08-28 DIAGNOSIS — I2699 Other pulmonary embolism without acute cor pulmonale: Secondary | ICD-10-CM | POA: Diagnosis not present

## 2022-08-28 DIAGNOSIS — J9601 Acute respiratory failure with hypoxia: Secondary | ICD-10-CM | POA: Diagnosis present

## 2022-08-28 DIAGNOSIS — G4733 Obstructive sleep apnea (adult) (pediatric): Secondary | ICD-10-CM | POA: Diagnosis not present

## 2022-08-28 DIAGNOSIS — Z886 Allergy status to analgesic agent status: Secondary | ICD-10-CM | POA: Diagnosis not present

## 2022-08-28 DIAGNOSIS — I4891 Unspecified atrial fibrillation: Secondary | ICD-10-CM | POA: Diagnosis present

## 2022-08-28 DIAGNOSIS — Z4803 Encounter for change or removal of drains: Secondary | ICD-10-CM | POA: Diagnosis not present

## 2022-08-28 DIAGNOSIS — E7800 Pure hypercholesterolemia, unspecified: Secondary | ICD-10-CM | POA: Diagnosis present

## 2022-08-28 DIAGNOSIS — J84112 Idiopathic pulmonary fibrosis: Secondary | ICD-10-CM | POA: Diagnosis not present

## 2022-08-28 DIAGNOSIS — Z818 Family history of other mental and behavioral disorders: Secondary | ICD-10-CM

## 2022-08-28 DIAGNOSIS — Z955 Presence of coronary angioplasty implant and graft: Secondary | ICD-10-CM | POA: Diagnosis not present

## 2022-08-28 DIAGNOSIS — Z888 Allergy status to other drugs, medicaments and biological substances status: Secondary | ICD-10-CM

## 2022-08-28 DIAGNOSIS — K810 Acute cholecystitis: Secondary | ICD-10-CM | POA: Diagnosis not present

## 2022-08-28 DIAGNOSIS — I480 Paroxysmal atrial fibrillation: Secondary | ICD-10-CM | POA: Diagnosis present

## 2022-08-28 DIAGNOSIS — K8020 Calculus of gallbladder without cholecystitis without obstruction: Secondary | ICD-10-CM | POA: Diagnosis not present

## 2022-08-28 DIAGNOSIS — F419 Anxiety disorder, unspecified: Secondary | ICD-10-CM | POA: Diagnosis present

## 2022-08-28 DIAGNOSIS — G629 Polyneuropathy, unspecified: Secondary | ICD-10-CM | POA: Diagnosis present

## 2022-08-28 DIAGNOSIS — J441 Chronic obstructive pulmonary disease with (acute) exacerbation: Principal | ICD-10-CM | POA: Diagnosis present

## 2022-08-28 DIAGNOSIS — Z86711 Personal history of pulmonary embolism: Secondary | ICD-10-CM | POA: Diagnosis not present

## 2022-08-28 DIAGNOSIS — I2510 Atherosclerotic heart disease of native coronary artery without angina pectoris: Secondary | ICD-10-CM | POA: Diagnosis present

## 2022-08-28 DIAGNOSIS — M1990 Unspecified osteoarthritis, unspecified site: Secondary | ICD-10-CM | POA: Diagnosis not present

## 2022-08-28 DIAGNOSIS — Z79899 Other long term (current) drug therapy: Secondary | ICD-10-CM

## 2022-08-28 DIAGNOSIS — J432 Centrilobular emphysema: Secondary | ICD-10-CM

## 2022-08-28 DIAGNOSIS — Z808 Family history of malignant neoplasm of other organs or systems: Secondary | ICD-10-CM

## 2022-08-28 DIAGNOSIS — F32A Depression, unspecified: Secondary | ICD-10-CM | POA: Diagnosis not present

## 2022-08-28 DIAGNOSIS — I2693 Single subsegmental pulmonary embolism without acute cor pulmonale: Secondary | ICD-10-CM

## 2022-08-28 DIAGNOSIS — I2609 Other pulmonary embolism with acute cor pulmonale: Secondary | ICD-10-CM | POA: Diagnosis not present

## 2022-08-28 DIAGNOSIS — R0602 Shortness of breath: Secondary | ICD-10-CM | POA: Diagnosis not present

## 2022-08-28 DIAGNOSIS — J90 Pleural effusion, not elsewhere classified: Secondary | ICD-10-CM | POA: Diagnosis not present

## 2022-08-28 DIAGNOSIS — J9621 Acute and chronic respiratory failure with hypoxia: Secondary | ICD-10-CM

## 2022-08-28 DIAGNOSIS — I252 Old myocardial infarction: Secondary | ICD-10-CM

## 2022-08-28 DIAGNOSIS — I452 Bifascicular block: Secondary | ICD-10-CM | POA: Diagnosis present

## 2022-08-28 DIAGNOSIS — Z800 Family history of malignant neoplasm of digestive organs: Secondary | ICD-10-CM

## 2022-08-28 DIAGNOSIS — Z811 Family history of alcohol abuse and dependence: Secondary | ICD-10-CM

## 2022-08-28 DIAGNOSIS — M5030 Other cervical disc degeneration, unspecified cervical region: Secondary | ICD-10-CM | POA: Diagnosis present

## 2022-08-28 LAB — BASIC METABOLIC PANEL
Anion gap: 10 (ref 5–15)
BUN: 14 mg/dL (ref 8–23)
CO2: 21 mmol/L — ABNORMAL LOW (ref 22–32)
Calcium: 8.5 mg/dL — ABNORMAL LOW (ref 8.9–10.3)
Chloride: 102 mmol/L (ref 98–111)
Creatinine, Ser: 0.8 mg/dL (ref 0.61–1.24)
GFR, Estimated: >60 mL/min (ref >60)
Glucose, Bld: 181 mg/dL — ABNORMAL HIGH (ref 70–99)
Potassium: 4.4 mmol/L (ref 3.5–5.1)
Sodium: 133 mmol/L — ABNORMAL LOW (ref 135–145)

## 2022-08-28 LAB — CBC WITH DIFFERENTIAL/PLATELET
Abs Immature Granulocytes: 0.08 10*3/uL — ABNORMAL HIGH (ref 0.00–0.07)
Basophils Absolute: 0 10*3/uL (ref 0.0–0.1)
Basophils Relative: 0 %
Eosinophils Absolute: 0 10*3/uL (ref 0.0–0.5)
Eosinophils Relative: 0 %
HCT: 40.9 % (ref 39.0–52.0)
Hemoglobin: 13.2 g/dL (ref 13.0–17.0)
Immature Granulocytes: 1 %
Lymphocytes Relative: 6 %
Lymphs Abs: 0.7 10*3/uL (ref 0.7–4.0)
MCH: 30.8 pg (ref 26.0–34.0)
MCHC: 32.3 g/dL (ref 30.0–36.0)
MCV: 95.6 fL (ref 80.0–100.0)
Monocytes Absolute: 0.7 10*3/uL (ref 0.1–1.0)
Monocytes Relative: 6 %
Neutro Abs: 10.6 10*3/uL — ABNORMAL HIGH (ref 1.7–7.7)
Neutrophils Relative %: 87 %
Platelets: 209 10*3/uL (ref 150–400)
RBC: 4.28 MIL/uL (ref 4.22–5.81)
RDW: 13.8 % (ref 11.5–15.5)
WBC: 12.1 10*3/uL — ABNORMAL HIGH (ref 4.0–10.5)
nRBC: 0 % (ref 0.0–0.2)

## 2022-08-28 LAB — I-STAT VENOUS BLOOD GAS, ED
Acid-base deficit: 1 mmol/L (ref 0.0–2.0)
Bicarbonate: 22.7 mmol/L (ref 20.0–28.0)
Calcium, Ion: 1.14 mmol/L — ABNORMAL LOW (ref 1.15–1.40)
HCT: 43 % (ref 39.0–52.0)
Hemoglobin: 14.6 g/dL (ref 13.0–17.0)
O2 Saturation: 77 %
Potassium: 4.4 mmol/L (ref 3.5–5.1)
Sodium: 136 mmol/L (ref 135–145)
TCO2: 24 mmol/L (ref 22–32)
pCO2, Ven: 35.7 mmHg — ABNORMAL LOW (ref 44–60)
pH, Ven: 7.411 (ref 7.25–7.43)
pO2, Ven: 41 mmHg (ref 32–45)

## 2022-08-28 LAB — BRAIN NATRIURETIC PEPTIDE: B Natriuretic Peptide: 168.3 pg/mL — ABNORMAL HIGH (ref 0.0–100.0)

## 2022-08-28 MED ORDER — IPRATROPIUM-ALBUTEROL 0.5-2.5 (3) MG/3ML IN SOLN
RESPIRATORY_TRACT | Status: AC
  Filled 2022-08-28: qty 3

## 2022-08-28 MED ORDER — IPRATROPIUM-ALBUTEROL 0.5-2.5 (3) MG/3ML IN SOLN
3.0000 mL | Freq: Once | RESPIRATORY_TRACT | Status: AC
  Administered 2022-08-28: 3 mL via RESPIRATORY_TRACT

## 2022-08-28 MED ORDER — METHYLPREDNISOLONE SODIUM SUCC 125 MG IJ SOLR
125.0000 mg | Freq: Once | INTRAMUSCULAR | Status: AC
  Administered 2022-08-28: 125 mg via INTRAVENOUS
  Filled 2022-08-28: qty 2

## 2022-08-28 MED ORDER — IPRATROPIUM-ALBUTEROL 0.5-2.5 (3) MG/3ML IN SOLN
3.0000 mL | Freq: Once | RESPIRATORY_TRACT | Status: AC
  Administered 2022-08-28: 3 mL via RESPIRATORY_TRACT
  Filled 2022-08-28: qty 3

## 2022-08-28 NOTE — ED Notes (Signed)
Patient oxygen saturation on room air while at rest =91% Patient oxygen saturation while ambulating 85%, only walking 10 feet Patient was dizzy and stumbling walking with his cane while ambulating. Patient A rest while on room air 91% post 3 minutes.

## 2022-08-28 NOTE — ED Provider Notes (Signed)
[place] EMERGENCY DEPARTMENT AT [facility] [place] Provider Note   CSN: [id number] Arrival date & time: 08/28/22  1616     History  No chief complaint on file.   Robert Lang is a 72 y.o. male.  Patient with history of idiopathic pulmonary fibrosis presents to the emergency department for evaluation of shortness of breath.  Patient is not currently on chronic oxygen.  He states that he has had increased work of breathing and shortness of breath over the past 2 to 3 weeks.  He has a persistent cough that is nonproductive.  No fevers or upper respiratory tract infection symptoms.  He has been in touch with his pulmonologist and has follow-up in 2 days.  He did contact the office yesterday and was prescribed a tapered course of prednisone which she started yesterday.  He states that he was previously on a shorter 3-day course about a week ago.  He monitors his pulse ox closely at home.  Typically he is in the low 90s.  Today he was dropping down into the mid the upper 80s at times, but it does not seem like this was persistently low.  The low pulse ox readings did worry him however, prompting emergency department visit today.  No lower extremity edema.  No chest pain.  Pulmonology plan to assess need for oxygen at time of visit 08/30/2022.       Home Medications Prior to Admission medications   Medication Sig Start Date End Date Taking? Authorizing Provider  pregabalin (LYRICA) 150 MG capsule Take 1 capsule (150 mg total) by mouth 2 (two) times daily. 07/15/22   Windell Norfolk, MD  atorvastatin (LIPITOR) 80 MG tablet Take 1 tablet (80 mg total) by mouth daily. 04/28/22   Georgeanna Lea, MD  buPROPion (WELLBUTRIN XL) 300 MG 24 hr tablet Take 1 tablet (300 mg total) by mouth daily. 08/20/22   Joan Flores, NP  busPIRone (BUSPAR) 15 MG tablet Take 1 tablet (15 mg total) by mouth 3 (three) times daily. 08/20/22   Joan Flores, NP  Cholecalciferol 25 MCG (1000 UT) tablet Take 1,000  Units by mouth daily.     [provider]  docusate sodium (COLACE) 100 MG capsule Take 100 mg by mouth daily.    [provider]  famotidine (PEPCID) 40 MG tablet TAKE 1 TABLET(40 MG) BY MOUTH AT BEDTIME 08/11/22   Georgeanna Lea, MD  fexofenadine (ALLEGRA) 180 MG tablet Take 180 mg by mouth at bedtime.  04/09/08   [provider]  finasteride (PROSCAR) 5 MG tablet Take 1 tablet (5 mg total) by mouth daily. 03/10/21   Georgeanna Lea, MD  Ginger, Zingiber officinalis, (GINGER ROOT) 550 MG CAPS Take 550 mg by mouth daily.    [provider]  lamoTRIgine (LAMICTAL) 25 MG tablet Take one tablet by mouth 25 mg daily for 14 days, then take two tablets 50 mg total daily. 08/20/22   Joan Flores, NP  Lidocaine 4 % LOTN Use as needed in the bilateral lower extremities Patient taking differently: Apply 1 application  topically as needed (LE pain). Use as needed in the bilateral lower extremities 03/01/22   Windell Norfolk, MD  LORazepam (ATIVAN) 0.5 MG tablet Take one tablet by mouth only for severe anxiety or agitation 08/20/22   Avelina Laine A, NP  melatonin 5 MG TABS Take 10 mg by mouth at bedtime.    [provider]  Multiple Vitamins-Minerals (PRESERVISION AREDS PO) Take 1  capsule by mouth in the morning and at bedtime.    [provider]  nitroGLYCERIN (NITROSTAT) 0.4 MG SL tablet Place 1 tablet (0.4 mg total) under the tongue every 5 (five) minutes as needed for chest pain. 03/10/21   Georgeanna Lea, MD  pantoprazole (PROTONIX) 40 MG tablet TAKE 1 TABLET(40 MG) BY MOUTH DAILY 07/01/22   Lynann Bologna, MD  Pirfenidone (ESBRIET) 801 MG TABS Take 1 tablet (801 mg total) by mouth 3 (three) times daily. 06/24/22   Kalman Shan, MD  Polyethylene Glycol 3350 (MIRALAX PO) Take 17 g by mouth daily.    [provider]  predniSONE (DELTASONE) 10 MG tablet 4 tabs for 2 days, then 3 tabs for 2 days, 2 tabs for 2 days, then 1 tab for 2  days, then stop 08/27/22   Glenford Bayley, NP  ranolazine (RANEXA) 1000 MG SR tablet Take 1 tablet (1,000 mg total) by mouth 2 (two) times daily. 05/27/22   Georgeanna Lea, MD  sertraline (ZOLOFT) 100 MG tablet Take 1.5 tablets (150 mg total) by mouth at bedtime. 06/28/22   Joan Flores, NP  traZODone (DESYREL) 50 MG tablet Take 1 tablet (50 mg total) by mouth at bedtime as needed for sleep (May take 1/2 tab to 1 tablet for sleep, as needed.). 06/28/22   Joan Flores, NP  vitamin B-12 (CYANOCOBALAMIN) 1000 MCG tablet Take 1,000 mcg by mouth daily.    [provider]  Vitamin D, Ergocalciferol, (DRISDOL) 1.25 MG (50000 UNIT) CAPS capsule Take 1 capsule (50,000 Units total) by mouth every 7 (seven) days. 05/19/22   [name], [name], [name]  XARELTO 20 MG TABS tablet TAKE 1 TABLET BY MOUTH DAILY WITH SUPPER 08/23/22   Georgeanna Lea, MD  zolpidem (AMBIEN) 5 MG tablet Take 1 tablet (5 mg total) by mouth at bedtime as needed for sleep. 06/15/22 12/12/22  Windell Norfolk, MD      Allergies    Naproxen and Silodosin    Review of Systems   Review of Systems  Physical Exam Updated Vital Signs BP (!) 148/81   Pulse 86   Temp (!) 97.5 F (36.4 C) (Oral)   Resp 19   Ht 5\' 11"  (1.803 m)   Wt 90.7 kg   SpO2 97%   BMI 27.89 kg/m   Physical Exam Vitals and nursing note reviewed.  Constitutional:      General: He is not in acute distress.    Appearance: He is well-developed.  HENT:     Head: Normocephalic and atraumatic.  Eyes:     General:        Right eye: No discharge.        Left eye: No discharge.     Conjunctiva/sclera: Conjunctivae normal.  Cardiovascular:     Rate and Rhythm: Normal rate and regular rhythm.     Heart sounds: Normal heart sounds.  Pulmonary:     Effort: Pulmonary effort is normal. No tachypnea.     Breath sounds: Examination of the right-middle field reveals decreased breath sounds and rales. Examination of the left-middle field reveals decreased breath  sounds and rales. Examination of the right-lower field reveals decreased breath sounds and rales. Examination of the left-lower field reveals decreased breath sounds and rales. Decreased breath sounds and rales present.  Abdominal:     Palpations: Abdomen is soft.     Tenderness: There is no abdominal tenderness.  Musculoskeletal:     Cervical back: Normal range of motion and  neck supple.  Skin:    General: Skin is warm and dry.  Neurological:     Mental Status: He is alert.     ED Results / Procedures / Treatments   Labs (all labs ordered are listed, but only abnormal results are displayed) Labs Reviewed  CBC WITH DIFFERENTIAL/PLATELET - Abnormal; Notable for the following components:      Result Value   WBC 12.1 (*)    Neutro Abs 10.6 (*)    Abs Immature Granulocytes 0.08 (*)    All other components within normal limits  BASIC METABOLIC PANEL - Abnormal; Notable for the following components:   Sodium 133 (*)    CO2 21 (*)    Glucose, Bld 181 (*)    Calcium 8.5 (*)    All other components within normal limits  BRAIN NATRIURETIC PEPTIDE - Abnormal; Notable for the following components:   B Natriuretic Peptide 168.3 (*)    All other components within normal limits  I-STAT VENOUS BLOOD GAS, ED - Abnormal; Notable for the following components:   pCO2, Ven 35.7 (*)    Calcium, Ion 1.14 (*)    All other components within normal limits    EKG None  Radiology DG Chest 2 View  Result Date: 08/28/2022 CLINICAL DATA:  Dyspnea EXAM: CHEST - 2 VIEW COMPARISON:  09/02/2021 chest radiograph. FINDINGS: Stable cardiomediastinal silhouette with normal heart size. No pneumothorax. No pleural effusion. No pulmonary edema. Mild streaky bibasilar scarring versus atelectasis. No acute consolidative airspace disease. IMPRESSION: Mild streaky bibasilar scarring versus atelectasis. Otherwise no active cardiopulmonary disease. Electronically Signed   By: Delbert Phenix M.D.   On: 08/28/2022 16:59     Procedures Procedures    Medications Ordered in ED Medications  ipratropium-albuterol (DUONEB) 0.5-2.5 (3) MG/3ML nebulizer solution (  Not Given 08/28/22 1835)  ipratropium-albuterol (DUONEB) 0.5-2.5 (3) MG/3ML nebulizer solution 3 mL (3 mLs Nebulization Given by Other 08/28/22 1624)  ipratropium-albuterol (DUONEB) 0.5-2.5 (3) MG/3ML nebulizer solution 3 mL (3 mLs Nebulization Given 08/28/22 1834)  methylPREDNISolone sodium succinate (SOLU-MEDROL) 125 mg/2 mL injection 125 mg (125 mg Intravenous Given 08/28/22 1842)    ED Course/ Medical Decision Making/ A&P    Patient seen and examined. History obtained directly from patient.   Labs/EKG: Ordered CBC, BMP, BNP, i-STAT VBG.  Imaging: Ordered test x-ray.  Medications/Fluids: DuoNeb given on arrival  Most recent vital signs reviewed and are as follows: BP (!) 148/81   Pulse 86   Temp (!) 97.5 F (36.4 C) (Oral)   Resp 19   Ht 5\' 11"  (1.803 m)   Wt 90.7 kg   SpO2 97%   BMI 27.89 kg/m   Initial impression: Shortness of breath in setting of IPF.  Oxygen saturations on room air during my initial interview ranged around 90%  6:54 PM Reassessment performed. Patient appears stable.  RT attempted to ambulate.  Patient stood up from the bed and mated to the doorway of the room before becoming very short of breath, unsteady on his feet and had increased work of breathing.  They immediately returned to the bedside.  Patient reportedly had an oxygen saturation of 85% with a good waveform.  Labs personally reviewed and interpreted including: CBC with elevated white blood cell count to 12.1 possibly in part related to demargination from prednisone use; BMP with elevated glucose at 181, mild hyponatremia at 133, normal kidney function and anion gap; BNP was minimally elevated at 168; venous blood gas with normal pH and low CO2.  Imaging personally visualized and interpreted including: Chest x-ray agree no infiltrate  Reviewed pertinent lab work  and imaging with patient at bedside. Questions answered.   Most current vital signs reviewed and are as follows: BP (!) 148/81   Pulse 86   Temp (!) 97.5 F (36.4 C) (Oral)   Resp 19   Ht 5\' 11"  (1.803 m)   Wt 90.7 kg   SpO2 97%   BMI 27.89 kg/m   Plan: Due to hypoxia and significant symptoms with ambulation, patient will be admitted to the hospital.  Will treat with IV Solu-Medrol and serial nebulizer treatments.  Patient and wife agree with plan at this point.  He is requesting to take his home nighttime medications which she has with him at bedside.                            Medical Decision Making Amount and/or Complexity of Data Reviewed Labs: ordered. Radiology: ordered.  Risk Prescription drug management.   Patient with idiopathic pulmonary fibrosis with increased work of breathing and recently worsening hypoxia.  Low concern for ACS, PE, pneumonia.  He seems to have somewhat of a recent downtrend in regards to his home oxygen levels and overall symptoms.  He will need to be admitted for further treatment and supplemental oxygen.        Final Clinical Impression(s) / ED Diagnoses Final diagnoses:  Acute on chronic respiratory failure with hypoxia [facility])    Rx / DC Orders ED Discharge Orders     None         Renne Crigler, [name] 08/28/22 1911    Cathren Laine, MD 08/29/22 2137

## 2022-08-28 NOTE — ED Notes (Signed)
Patient oxygen saturation on room air while at rest 88%. Patient states that he has a hard time catching his breath while ambulating and had oxygen saturation decrease to 85% @ home

## 2022-08-28 NOTE — ED Triage Notes (Signed)
C/o worsening shortness of breath x 2-3 weeks. States SpO2 has been running low at home. Dyspea at rest in triage, wheezing noted. RT in triage to assess.  Recently on steroids. HX of several lung diseases.

## 2022-08-28 NOTE — ED Notes (Signed)
VBG completed  

## 2022-08-28 NOTE — Progress Notes (Signed)
Plan of Care Note for accepted transfer   Patient: Lamarco Gudiel MRN: [id number]   DOA: 08/28/2022  Facility requesting transfer: [facility] Requesting Provider: Linwood Dibbles, [name]  Reason for transfer:  IPF flare with hypoxemia Facility course: Ansel Ferrall is a 72 y.o. male, with history of idiopathic pulmonary fibrosis, CAD, essential hypertension, atrial flutter/fibrillation, OSA and COPD, who presented to the emergency department for evaluation of shortness of breath.  Patient is not currently on chronic oxygen.  He states that he has had increased work of breathing and shortness of breath over the past 2 to 3 weeks.  He has a persistent cough that is nonproductive.  No fevers or upper respiratory tract infection symptoms.  He has been in touch with his pulmonologist and has follow-up in 2 days.  He did contact the office yesterday and was prescribed a tapered course of prednisone which she started yesterday.  He states that he was previously on a shorter 3-day course about a week ago.  He monitors his pulse ox closely at home.  Typically he is in the low 90s.  Today he was dropping down into the mid the upper 80s at times, but it does not seem like this was persistently low.  The low pulse ox readings did worry him however, prompting emergency department visit today.  No lower extremity edema.  No chest pain.  Pulmonology plan to assess need for oxygen at time of visit 08/30/2022.  When he came to the ER, BP was 148/79 with temperature of 97.5 and pulse oximetry was 93 to 94% on room air and 97% on 2 L of O2 by nasal cannula when it dropped to the 80s.  Labs revealed mild hyponatremia 133 and a CO2 of 21 with otherwise unremarkable BMP.  BN P was 168.3 and CBC showed mild leukocytosis of 12.1 with neutrophilia.  2 view chest x-ray showed mild streaky basilar scarring versus atelectasis with no active cardiopulmonary disease. 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  The patient was given 125 mg of IV Solu-Medrol as well as 2 DuoNebs.    Plan of care: The patient is accepted for admission to Telemetry unit, at [facility]..   The patient will be under the care and responsibility of the ER provider until arrival to [facility].  Author: Hannah Beat, MD 08/28/2022  Check [email or web address] for on-call coverage.  Nursing staff, Please call [facility] Admits & Consults System-Wide number on Amion as soon as patient's arrival, so appropriate admitting provider can evaluate the pt.

## 2022-08-28 NOTE — Progress Notes (Signed)
Patient was placed on 2L [place] due to increased SOB and WOB. Patient states it makes his breathing easier.

## 2022-08-28 NOTE — ED Provider Notes (Signed)
ILD here for hypoxia, worsening shortness of breath.  Found to be hypoxic here.  DM not consistent with infection, fluid overload, PE.  Suspect likely worsening of his chronic condition however patient found to have persistent hypoxia requiring 2 L via nasal cannula.  Plan I discussed with hospitalist for admission for hypoxic respiratory failure Physical Exam  BP (!) 148/81   Pulse 86   Temp (!) 97.5 F (36.4 C) (Oral)   Resp 19   Ht 5\' 11"  (1.803 m)   Wt 90.7 kg   SpO2 97%   BMI 27.89 kg/m   Physical Exam  Procedures  Procedures Labs Reviewed  CBC WITH DIFFERENTIAL/PLATELET - Abnormal; Notable for the following components:      Result Value   WBC 12.1 (*)    Neutro Abs 10.6 (*)    Abs Immature Granulocytes 0.08 (*)    All other components within normal limits  BASIC METABOLIC PANEL - Abnormal; Notable for the following components:   Sodium 133 (*)    CO2 21 (*)    Glucose, Bld 181 (*)    Calcium 8.5 (*)    All other components within normal limits  BRAIN NATRIURETIC PEPTIDE - Abnormal; Notable for the following components:   B Natriuretic Peptide 168.3 (*)    All other components within normal limits  I-STAT VENOUS BLOOD GAS, ED - Abnormal; Notable for the following components:   pCO2, Ven 35.7 (*)    Calcium, Ion 1.14 (*)    All other components within normal limits   DG Chest 2 View  Result Date: 08/28/2022 CLINICAL DATA:  Dyspnea EXAM: CHEST - 2 VIEW COMPARISON:  09/02/2021 chest radiograph. FINDINGS: Stable cardiomediastinal silhouette with normal heart size. No pneumothorax. No pleural effusion. No pulmonary edema. Mild streaky bibasilar scarring versus atelectasis. No acute consolidative airspace disease. IMPRESSION: Mild streaky bibasilar scarring versus atelectasis. Otherwise no active cardiopulmonary disease. Electronically Signed   By: Delbert Phenix M.D.   On: 08/28/2022 16:59    ED Course / MDM    ILD here for hypoxia, worsening shortness of breath.  Found to be  hypoxic here.  DM not consistent with infection, fluid overload, PE.  Suspect likely worsening of his chronic condition however patient found to have persistent hypoxia requiring 2 L via nasal cannula.  Plan I discussed with hospitalist for admission for hypoxic respiratory failure  Labs and imaging personally viewed and interpreted:  CBC leukocytosis 12.1 BNP 168 Metabolic panel without significant normality X-ray bibasilar scarring versus atelectasis   Discussed with hospitalist Dr. Ar[facility] is agreeable to accept patient for transfer for admission  Medical Decision Making Amount and/or Complexity of Data Reviewed Independent Historian: spouse External Data Reviewed: labs, radiology, ECG and notes. Labs: ordered. Decision-making details documented in ED Course. Radiology: ordered and independent interpretation performed. Decision-making details documented in ED Course. ECG/medicine tests: ordered and independent interpretation performed. Decision-making details documented in ED Course.  Risk OTC drugs. Prescription drug management. Decision regarding hospitalization. Diagnosis or treatment significantly limited by social determinants of health.          Knox Cervi A, [name] 08/28/22 1923    Cathren Laine, MD 08/29/22 2137

## 2022-08-28 NOTE — Telephone Encounter (Signed)
Spoke with the patients wife, Luisa Dago, over the phone. She reports that he has an appointment about 8 days ago, where some changes were made. On Tuesday of this past week he vomited, and since then has been having some trouble breathing, been lethargic, and overall not been doing well. She also notes some low O2 saturations lately as well. NO reported rash - she has not taken his temperature.  She has moved Buspirone back to 2 per day, and asks if Lamictal is causing this, as it was added. Informed her that this would be an unusual side effect of Lamictal. Given his history of IPF, and other significant medical history I have recommended they go to the emergency department or at the least urgent care to ensure there is nothing more serious going on medically. I am worried about his lethargy and dropping O2 saturations. She is agreeable to this plan. If nothing is found on a medical evaluation, I have told them to stop Lamictal for now.

## 2022-08-28 NOTE — ED Notes (Signed)
[facility] called for transport. 

## 2022-08-29 ENCOUNTER — Observation Stay (HOSPITAL_COMMUNITY): Payer: Medicare Other

## 2022-08-29 ENCOUNTER — Encounter (HOSPITAL_COMMUNITY): Payer: Self-pay | Admitting: Family Medicine

## 2022-08-29 DIAGNOSIS — F32A Depression, unspecified: Secondary | ICD-10-CM | POA: Diagnosis present

## 2022-08-29 DIAGNOSIS — M1990 Unspecified osteoarthritis, unspecified site: Secondary | ICD-10-CM | POA: Diagnosis present

## 2022-08-29 DIAGNOSIS — Z888 Allergy status to other drugs, medicaments and biological substances status: Secondary | ICD-10-CM | POA: Diagnosis not present

## 2022-08-29 DIAGNOSIS — J441 Chronic obstructive pulmonary disease with (acute) exacerbation: Secondary | ICD-10-CM | POA: Diagnosis present

## 2022-08-29 DIAGNOSIS — K810 Acute cholecystitis: Secondary | ICD-10-CM | POA: Diagnosis not present

## 2022-08-29 DIAGNOSIS — D1803 Hemangioma of intra-abdominal structures: Secondary | ICD-10-CM | POA: Diagnosis not present

## 2022-08-29 DIAGNOSIS — E7800 Pure hypercholesterolemia, unspecified: Secondary | ICD-10-CM | POA: Diagnosis present

## 2022-08-29 DIAGNOSIS — Z955 Presence of coronary angioplasty implant and graft: Secondary | ICD-10-CM | POA: Diagnosis not present

## 2022-08-29 DIAGNOSIS — I2699 Other pulmonary embolism without acute cor pulmonale: Secondary | ICD-10-CM | POA: Diagnosis not present

## 2022-08-29 DIAGNOSIS — I2510 Atherosclerotic heart disease of native coronary artery without angina pectoris: Secondary | ICD-10-CM | POA: Diagnosis present

## 2022-08-29 DIAGNOSIS — I252 Old myocardial infarction: Secondary | ICD-10-CM | POA: Diagnosis not present

## 2022-08-29 DIAGNOSIS — I2609 Other pulmonary embolism with acute cor pulmonale: Secondary | ICD-10-CM | POA: Diagnosis not present

## 2022-08-29 DIAGNOSIS — K219 Gastro-esophageal reflux disease without esophagitis: Secondary | ICD-10-CM | POA: Diagnosis present

## 2022-08-29 DIAGNOSIS — G629 Polyneuropathy, unspecified: Secondary | ICD-10-CM | POA: Diagnosis present

## 2022-08-29 DIAGNOSIS — J9811 Atelectasis: Secondary | ICD-10-CM | POA: Diagnosis not present

## 2022-08-29 DIAGNOSIS — F419 Anxiety disorder, unspecified: Secondary | ICD-10-CM | POA: Diagnosis present

## 2022-08-29 DIAGNOSIS — Z87891 Personal history of nicotine dependence: Secondary | ICD-10-CM | POA: Diagnosis not present

## 2022-08-29 DIAGNOSIS — J9601 Acute respiratory failure with hypoxia: Secondary | ICD-10-CM | POA: Diagnosis present

## 2022-08-29 DIAGNOSIS — G4733 Obstructive sleep apnea (adult) (pediatric): Secondary | ICD-10-CM | POA: Diagnosis present

## 2022-08-29 DIAGNOSIS — Z86711 Personal history of pulmonary embolism: Secondary | ICD-10-CM | POA: Diagnosis not present

## 2022-08-29 DIAGNOSIS — J9621 Acute and chronic respiratory failure with hypoxia: Secondary | ICD-10-CM | POA: Diagnosis present

## 2022-08-29 DIAGNOSIS — J90 Pleural effusion, not elsewhere classified: Secondary | ICD-10-CM | POA: Diagnosis not present

## 2022-08-29 DIAGNOSIS — M5030 Other cervical disc degeneration, unspecified cervical region: Secondary | ICD-10-CM | POA: Diagnosis present

## 2022-08-29 DIAGNOSIS — I480 Paroxysmal atrial fibrillation: Secondary | ICD-10-CM | POA: Diagnosis present

## 2022-08-29 DIAGNOSIS — E871 Hypo-osmolality and hyponatremia: Secondary | ICD-10-CM | POA: Diagnosis present

## 2022-08-29 DIAGNOSIS — I2693 Single subsegmental pulmonary embolism without acute cor pulmonale: Secondary | ICD-10-CM | POA: Diagnosis present

## 2022-08-29 DIAGNOSIS — K8020 Calculus of gallbladder without cholecystitis without obstruction: Secondary | ICD-10-CM | POA: Diagnosis not present

## 2022-08-29 DIAGNOSIS — J84112 Idiopathic pulmonary fibrosis: Secondary | ICD-10-CM | POA: Diagnosis present

## 2022-08-29 DIAGNOSIS — Z79899 Other long term (current) drug therapy: Secondary | ICD-10-CM | POA: Diagnosis not present

## 2022-08-29 DIAGNOSIS — I452 Bifascicular block: Secondary | ICD-10-CM | POA: Diagnosis present

## 2022-08-29 DIAGNOSIS — I10 Essential (primary) hypertension: Secondary | ICD-10-CM | POA: Diagnosis present

## 2022-08-29 DIAGNOSIS — Z886 Allergy status to analgesic agent status: Secondary | ICD-10-CM | POA: Diagnosis not present

## 2022-08-29 LAB — HEPATIC FUNCTION PANEL
ALT: 30 U/L (ref 0–44)
AST: 31 U/L (ref 15–41)
Albumin: 2.8 g/dL — ABNORMAL LOW (ref 3.5–5.0)
Alkaline Phosphatase: 70 U/L (ref 38–126)
Bilirubin, Direct: 0.1 mg/dL (ref 0.0–0.2)
Indirect Bilirubin: 0.3 mg/dL (ref 0.3–0.9)
Total Bilirubin: 0.4 mg/dL (ref 0.3–1.2)
Total Protein: 7.2 g/dL (ref 6.5–8.1)

## 2022-08-29 LAB — ECHOCARDIOGRAM COMPLETE
AR max vel: 2.65 cm2
AV Area VTI: 2.84 cm2
AV Area mean vel: 2.72 cm2
AV Mean grad: 3 mmHg
AV Peak grad: 6 mmHg
Ao pk vel: 1.22 m/s
Area-P 1/2: 5.13 cm2
Height: 71 in
S' Lateral: 3.5 cm
Weight: 3148.8 oz

## 2022-08-29 LAB — EXPECTORATED SPUTUM ASSESSMENT W GRAM STAIN, RFLX TO RESP C

## 2022-08-29 LAB — CBC
HCT: 42 % (ref 39.0–52.0)
Hemoglobin: 13.8 g/dL (ref 13.0–17.0)
MCH: 31.7 pg (ref 26.0–34.0)
MCHC: 32.9 g/dL (ref 30.0–36.0)
MCV: 96.3 fL (ref 80.0–100.0)
Platelets: 221 10*3/uL (ref 150–400)
RBC: 4.36 MIL/uL (ref 4.22–5.81)
RDW: 13.5 % (ref 11.5–15.5)
WBC: 11.8 10*3/uL — ABNORMAL HIGH (ref 4.0–10.5)
nRBC: 0 % (ref 0.0–0.2)

## 2022-08-29 LAB — BASIC METABOLIC PANEL
Anion gap: 14 (ref 5–15)
BUN: 13 mg/dL (ref 8–23)
CO2: 21 mmol/L — ABNORMAL LOW (ref 22–32)
Calcium: 9.2 mg/dL (ref 8.9–10.3)
Chloride: 99 mmol/L (ref 98–111)
Creatinine, Ser: 0.87 mg/dL (ref 0.61–1.24)
GFR, Estimated: >60 mL/min (ref >60)
Glucose, Bld: 183 mg/dL — ABNORMAL HIGH (ref 70–99)
Potassium: 4.1 mmol/L (ref 3.5–5.1)
Sodium: 134 mmol/L — ABNORMAL LOW (ref 135–145)

## 2022-08-29 LAB — LIPASE, BLOOD: Lipase: 47 U/L (ref 11–51)

## 2022-08-29 LAB — MAGNESIUM: Magnesium: 2.2 mg/dL (ref 1.7–2.4)

## 2022-08-29 LAB — APTT
aPTT: 56 seconds — ABNORMAL HIGH (ref 24–36)
aPTT: 60 seconds — ABNORMAL HIGH (ref 24–36)

## 2022-08-29 LAB — HEPARIN LEVEL (UNFRACTIONATED): Heparin Unfractionated: 0.65 IU/mL (ref 0.30–0.70)

## 2022-08-29 LAB — STREP PNEUMONIAE URINARY ANTIGEN: Strep Pneumo Urinary Antigen: NEGATIVE

## 2022-08-29 MED ORDER — ATORVASTATIN CALCIUM 80 MG PO TABS
80.0000 mg | ORAL_TABLET | Freq: Every day | ORAL | Status: DC
  Administered 2022-08-29 – 2022-09-01 (×4): 80 mg via ORAL
  Filled 2022-08-29 (×4): qty 1

## 2022-08-29 MED ORDER — SODIUM CHLORIDE 0.9 % IV SOLN
2.0000 g | Freq: Three times a day (TID) | INTRAVENOUS | Status: DC
  Administered 2022-08-29 (×2): 2 g via INTRAVENOUS
  Filled 2022-08-29 (×2): qty 12.5

## 2022-08-29 MED ORDER — ALBUTEROL SULFATE (2.5 MG/3ML) 0.083% IN NEBU: 2.5000 mg | INHALATION_SOLUTION | RESPIRATORY_TRACT | Status: DC | PRN

## 2022-08-29 MED ORDER — LAMOTRIGINE 100 MG PO TABS: 50.0000 mg | ORAL_TABLET | Freq: Every day | ORAL | Status: DC

## 2022-08-29 MED ORDER — SODIUM CHLORIDE 0.9 % IV SOLN: INTRAVENOUS | Status: DC | PRN

## 2022-08-29 MED ORDER — SODIUM CHLORIDE 0.9 % IV SOLN: INTRAVENOUS | Status: DC

## 2022-08-29 MED ORDER — PANTOPRAZOLE SODIUM 40 MG PO TBEC
40.0000 mg | DELAYED_RELEASE_TABLET | Freq: Every day | ORAL | Status: DC
  Administered 2022-08-29 – 2022-09-01 (×4): 40 mg via ORAL
  Filled 2022-08-29 (×4): qty 1

## 2022-08-29 MED ORDER — HEPARIN (PORCINE) 25000 UT/250ML-% IV SOLN
2000.0000 [IU]/h | INTRAVENOUS | Status: DC
  Administered 2022-08-29: 1700 [IU]/h via INTRAVENOUS
  Administered 2022-08-29: 1500 [IU]/h via INTRAVENOUS
  Administered 2022-08-30: 1850 [IU]/h via INTRAVENOUS
  Administered 2022-08-31 – 2022-09-01 (×3): 2000 [IU]/h via INTRAVENOUS
  Filled 2022-08-29 (×6): qty 250

## 2022-08-29 MED ORDER — PREDNISONE 20 MG PO TABS
40.0000 mg | ORAL_TABLET | Freq: Every day | ORAL | Status: DC
  Administered 2022-08-30 – 2022-09-01 (×3): 40 mg via ORAL
  Filled 2022-08-29 (×3): qty 2

## 2022-08-29 MED ORDER — IOHEXOL 350 MG/ML SOLN
75.0000 mL | Freq: Once | INTRAVENOUS | Status: AC | PRN
  Administered 2022-08-29: 75 mL via INTRAVENOUS

## 2022-08-29 MED ORDER — PREGABALIN 75 MG PO CAPS
150.0000 mg | ORAL_CAPSULE | Freq: Two times a day (BID) | ORAL | Status: DC
  Administered 2022-08-29 – 2022-09-01 (×7): 150 mg via ORAL
  Filled 2022-08-29 (×7): qty 2

## 2022-08-29 MED ORDER — LAMOTRIGINE 25 MG PO TABS
25.0000 mg | ORAL_TABLET | Freq: Every day | ORAL | Status: DC
  Administered 2022-08-29 – 2022-09-01 (×4): 25 mg via ORAL
  Filled 2022-08-29 (×4): qty 1

## 2022-08-29 MED ORDER — IPRATROPIUM-ALBUTEROL 0.5-2.5 (3) MG/3ML IN SOLN
3.0000 mL | Freq: Two times a day (BID) | RESPIRATORY_TRACT | Status: DC
  Administered 2022-08-29 – 2022-08-31 (×4): 3 mL via RESPIRATORY_TRACT
  Filled 2022-08-29 (×4): qty 3

## 2022-08-29 MED ORDER — METHYLPREDNISOLONE SODIUM SUCC 125 MG IJ SOLR
125.0000 mg | Freq: Two times a day (BID) | INTRAMUSCULAR | Status: AC
  Administered 2022-08-29 (×2): 125 mg via INTRAVENOUS
  Filled 2022-08-29 (×2): qty 2

## 2022-08-29 MED ORDER — FINASTERIDE 5 MG PO TABS
5.0000 mg | ORAL_TABLET | Freq: Every day | ORAL | Status: DC
  Administered 2022-08-29 – 2022-09-01 (×4): 5 mg via ORAL
  Filled 2022-08-29 (×4): qty 1

## 2022-08-29 MED ORDER — RIVAROXABAN 20 MG PO TABS: 20.0000 mg | ORAL_TABLET | Freq: Every day | ORAL | Status: DC

## 2022-08-29 MED ORDER — PIPERACILLIN-TAZOBACTAM 3.375 G IVPB
3.3750 g | Freq: Three times a day (TID) | INTRAVENOUS | Status: DC
  Administered 2022-08-29 – 2022-09-01 (×9): 3.375 g via INTRAVENOUS
  Filled 2022-08-29 (×10): qty 50

## 2022-08-29 MED ORDER — HEPARIN BOLUS VIA INFUSION
3000.0000 [IU] | Freq: Once | INTRAVENOUS | Status: AC
  Administered 2022-08-29: 3000 [IU] via INTRAVENOUS
  Filled 2022-08-29: qty 3000

## 2022-08-29 MED ORDER — PIRFENIDONE 801 MG PO TABS
801.0000 mg | ORAL_TABLET | Freq: Three times a day (TID) | ORAL | Status: DC
  Administered 2022-08-29 – 2022-09-01 (×9): 801 mg via ORAL
  Filled 2022-08-29 (×11): qty 1

## 2022-08-29 MED ORDER — ACETAMINOPHEN 325 MG PO TABS
650.0000 mg | ORAL_TABLET | Freq: Four times a day (QID) | ORAL | Status: DC | PRN
  Administered 2022-08-30: 650 mg via ORAL
  Filled 2022-08-29: qty 2

## 2022-08-29 MED ORDER — POLYETHYLENE GLYCOL 3350 17 G PO PACK: 17.0000 g | PACK | Freq: Every day | ORAL | Status: DC | PRN

## 2022-08-29 MED ORDER — BUPROPION HCL ER (XL) 150 MG PO TB24
300.0000 mg | ORAL_TABLET | Freq: Every day | ORAL | Status: DC
  Administered 2022-08-29 – 2022-09-01 (×4): 300 mg via ORAL
  Filled 2022-08-29 (×4): qty 2

## 2022-08-29 MED ORDER — TRAZODONE HCL 50 MG PO TABS
25.0000 mg | ORAL_TABLET | Freq: Every evening | ORAL | Status: DC | PRN
  Administered 2022-08-29 – 2022-08-31 (×3): 50 mg via ORAL
  Filled 2022-08-29 (×3): qty 1

## 2022-08-29 MED ORDER — BUSPIRONE HCL 5 MG PO TABS
15.0000 mg | ORAL_TABLET | Freq: Three times a day (TID) | ORAL | Status: DC
  Administered 2022-08-29 – 2022-09-01 (×10): 15 mg via ORAL
  Filled 2022-08-29 (×10): qty 3

## 2022-08-29 MED ORDER — ACETAMINOPHEN 650 MG RE SUPP: 650.0000 mg | Freq: Four times a day (QID) | RECTAL | Status: DC | PRN

## 2022-08-29 MED ORDER — RANOLAZINE ER 500 MG PO TB12
1000.0000 mg | ORAL_TABLET | Freq: Two times a day (BID) | ORAL | Status: DC
  Administered 2022-08-29 – 2022-09-01 (×7): 1000 mg via ORAL
  Filled 2022-08-29 (×8): qty 2

## 2022-08-29 MED ORDER — SODIUM CHLORIDE 0.9% FLUSH
3.0000 mL | Freq: Two times a day (BID) | INTRAVENOUS | Status: DC
  Administered 2022-08-29 – 2022-09-01 (×4): 3 mL via INTRAVENOUS

## 2022-08-29 MED ORDER — IPRATROPIUM-ALBUTEROL 0.5-2.5 (3) MG/3ML IN SOLN
3.0000 mL | Freq: Four times a day (QID) | RESPIRATORY_TRACT | Status: DC
  Administered 2022-08-29 (×2): 3 mL via RESPIRATORY_TRACT
  Filled 2022-08-29 (×3): qty 3

## 2022-08-29 NOTE — Progress Notes (Signed)
ANTICOAGULATION CONSULT NOTE - Initial Consult  Pharmacy Consult for heparin Indication:  acute PE despite Xarelto PTA for PAF  Allergies  Allergen Reactions   Naproxen Other (See Comments)    (Naprosyn *ANALGESICS - ANTI-INFLAMMATORY*) Nausea, Abdominal pain   Silodosin Other (See Comments)    Low blood pressure    Patient Measurements: Height: 5\' 11"  (180.3 cm) Weight: 89.3 kg (196 lb 12.8 oz) IBW/kg (Calculated) : 75.3  Vital Signs: Temp: 98.1 F (36.7 C) (06/09 0014) Temp Source: Oral (06/09 0014) BP: 150/86 (06/09 0014) Pulse Rate: 90 (06/09 0051)  Labs: Recent Labs    08/28/22 1717 08/28/22 1727 08/29/22 0251  HGB 13.2 14.6 13.8  HCT 40.9 43.0 42.0  PLT 209  --  221  CREATININE 0.80  --   --     Estimated Creatinine Clearance: 88.9 mL/min (by C-G formula based on SCr of 0.8 mg/dL).   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 M75.00)   Allergic rhinitis due to pollen 09/03/2013   J30.1)  Formatting of this note might be different from the original. J30.1)   Allergy    Anticoagulated 07/01/2016   Anxiety    Arthritis    Atrial fibrillation (HCC)    Atrial flutter (HCC) 06/26/2015   CAD (coronary artery disease)    Chest pain 06/26/2015   COPD (chronic obstructive pulmonary disease) (HCC)    Coronary artery disease 07/06/2018   Cardiac catheterization 2017 showing 90% small diagonal branch disease   DDD (degenerative disc disease), cervical 09/03/2013   M50.90)  Formatting of this note might be different from the original. M50.90)   Depression    Depression    Depressive disorder 09/03/2013   Dizziness 10/28/2017   Dyslipidemia, goal LDL below 70 07/06/2018   Dyspnea on exertion 10/20/2018   Esophageal reflux 09/03/2013    Essential hypertension 07/06/2018   ETOH abuse 01/11/2019   6 pack of beer per day   Falls 10/28/2017   GERD (gastroesophageal reflux disease)    H/O amiodarone therapy 07/01/2016   Heart attack (HCC)    Heart palpitations 01/27/2017   History of colon polyps    Hyperlipidemia    Hypertension    IPF (idiopathic pulmonary fibrosis) (HCC)    Kidney stones    Neck pain 09/03/2013   Neuropathy 07/06/2018   Obstructive sleep apnea 08/05/2015   PLMD (periodic limb movement disorder) 11/04/2015   Polycythemia, 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 STORY: Due to alcohol/ tobacco   Pure hypercholesterolemia 09/03/2013   E78.0)  Formatting of this note might be different from the original. E78.0)   Screening for prostate cancer 09/03/2013   Smoker 01/11/2019   Smoking 07/06/2018   Status post ablation of atrial flutter 07/06/2018   2017    Medications:  Medications Prior to Admission  Medication Sig Dispense Refill Last Dose   buPROPion (WELLBUTRIN XL) 300 MG 24 hr tablet Take 1 tablet (300 mg total) by mouth daily. 90 tablet 1 08/28/2022 at 1000   busPIRone (BUSPAR) 15 MG tablet Take 1 tablet (15 mg total) by mouth 3 (three) times daily. 90 tablet 3 08/28/2022 at 0800   Cholecalciferol 25 MCG (1000 UT) tablet Take 1,000 Units by mouth daily.  08/28/2022 at 1000   docusate sodium (COLACE) 100 MG capsule Take 100 mg by mouth daily.   08/28/2022 at 1000   famotidine (PEPCID) 40 MG tablet TAKE 1 TABLET(40 MG) BY MOUTH AT BEDTIME 90 tablet 2 08/27/2022 at 2200   fexofenadine (ALLEGRA) 180 MG tablet Take 180 mg by mouth at bedtime.    08/27/2022 at 2200   finasteride (PROSCAR) 5 MG tablet Take 1 tablet (5 mg total) by mouth daily. 90 tablet 1 08/27/2022 at 2200   Ginger, Zingiber officinalis, (GINGER ROOT) 550 MG CAPS Take 550 mg by mouth daily.   08/28/2022 at 0800   Multiple Vitamins-Minerals (PRESERVISION AREDS PO) Take 1  capsule by mouth in the morning and at bedtime.   08/28/2022 at 0800   pantoprazole (PROTONIX) 40 MG tablet TAKE 1 TABLET(40 MG) BY MOUTH DAILY 90 tablet 1 08/28/2022 at 0800   Pirfenidone (ESBRIET) 801 MG TABS Take 1 tablet (801 mg total) by mouth 3 (three) times daily. 270 tablet 1 08/28/2022 at 1200   Polyethylene Glycol 3350 (MIRALAX PO) Take 17 g by mouth daily.   08/28/2022 at 1000   pregabalin (LYRICA) 150 MG capsule Take 1 capsule (150 mg total) by mouth 2 (two) times daily. 90 capsule 5 08/28/2022 at 1000   ranolazine (RANEXA) 1000 MG SR tablet Take 1 tablet (1,000 mg total) by mouth 2 (two) times daily. 180 tablet 1 08/28/2022 at 1000   vitamin B-12 (CYANOCOBALAMIN) 1000 MCG tablet Take 1,000 mcg by mouth daily.   08/28/2022 at 0800   atorvastatin (LIPITOR) 80 MG tablet Take 1 tablet (80 mg total) by mouth daily. 90 tablet 2 08/27/2022 at 2000   lamoTRIgine (LAMICTAL) 25 MG tablet Take one tablet by mouth 25 mg daily for 14 days, then take two tablets 50 mg total daily. (Patient taking differently: Take 25 mg by mouth daily. started 25mg  08/22/22) 60 tablet 1    Lidocaine 4 % LOTN Use as needed in the bilateral lower extremities (Patient taking differently: Apply 1 application  topically as needed (LE pain). Use as needed in the bilateral lower extremities) 88 mL 0 More than a month   LORazepam (ATIVAN) 0.5 MG tablet Take one tablet by mouth only for severe anxiety or agitation 30 tablet 1    melatonin 5 MG TABS Take 10 mg by mouth at bedtime.   08/27/2022 at 2200   nitroGLYCERIN (NITROSTAT) 0.4 MG SL tablet Place 1 tablet (0.4 mg total) under the tongue every 5 (five) minutes as needed for chest pain. 25 tablet 3 More than a month   predniSONE (DELTASONE) 10 MG tablet 4 tabs for 2 days, then 3 tabs for 2 days, 2 tabs for 2 days, then 1 tab for 2 days, then stop 20 tablet 0    sertraline (ZOLOFT) 100 MG tablet Take 1.5 tablets (150 mg total) by mouth at bedtime. 135 tablet 1 08/27/2022 at 2200   traZODone  (DESYREL) 50 MG tablet Take 1 tablet (50 mg total) by mouth at bedtime as needed for sleep (May take 1/2 tab to 1 tablet for sleep, as needed.). 30 tablet 3 08/27/2022 at 2200   Vitamin D, Ergocalciferol, (DRISDOL) 1.25 MG (50000 UNIT) CAPS capsule Take 1 capsule (50,000 Units total) by mouth every 7 (seven) days. 8 capsule 0 More than a month   XARELTO 20 MG TABS tablet TAKE 1 TABLET BY MOUTH DAILY WITH SUPPER 30 tablet 5 08/27/2022 at 1700   zolpidem (AMBIEN) 5 MG tablet Take 1 tablet (  5 mg total) by mouth at bedtime as needed for sleep. 30 tablet 5 08/27/2022 at 2200   Scheduled:   atorvastatin  80 mg Oral Daily   buPROPion  300 mg Oral Daily   busPIRone  15 mg Oral TID   finasteride  5 mg Oral Daily   ipratropium-albuterol  3 mL Nebulization Q6H   lamoTRIgine  25 mg Oral Daily   Followed by   Melene Muller ON 09/05/2022] lamoTRIgine  50 mg Oral Daily   methylPREDNISolone (SOLU-MEDROL) injection  125 mg Intravenous Q12H   Followed by   Melene Muller ON 08/30/2022] predniSONE  40 mg Oral Q breakfast   pantoprazole  40 mg Oral Daily   Pirfenidone  801 mg Oral TID   pregabalin  150 mg Oral BID   ranolazine  1,000 mg Oral BID   sodium chloride flush  3 mL Intravenous Q12H   Infusions:   sodium chloride 10 mL/hr at 08/29/22 0337   ceFEPime (MAXIPIME) IV 2 g (08/29/22 0339)    Assessment: 72yo male c/o SOB x2-3wk, during w/u CT reveals small subsegmental PE despite Xarelto therapy PTA for PAF >> to transition to heparin; last dose of Xarelto taken 6/7 at 1700.  Goal of Therapy:  Heparin level 0.3-0.7 units/ml aPTT 66-102 seconds Monitor platelets by anticoagulation protocol: Yes   Plan:  Heparin 3000 units IV bolus followed by infusion at 1500 units/hr. Monitor heparin levels, aPTT (while DOAC affectings anti-Xa assay), and CBC.  Vernard Gambles, PharmD, BCPS  08/29/2022,3:53 AM

## 2022-08-29 NOTE — Progress Notes (Signed)
   08/29/22 2000  BiPAP/CPAP/SIPAP  BiPAP/CPAP/SIPAP Pt Type Adult  Reason BIPAP/CPAP not in use Other(comment) (Patient refused, says he'd rather use his home machine which he said he will ask someone to bring.)  Flow Rate 2 lpm  BiPAP/CPAP /SiPAP Vitals  SpO2 93 %

## 2022-08-29 NOTE — Progress Notes (Signed)
Patient refused cpap. States he will only use his from home which is not here.

## 2022-08-29 NOTE — Progress Notes (Signed)
ANTICOAGULATION CONSULT NOTE  Pharmacy Consult for heparin Indication:  acute PE despite Xarelto PTA for PAF  Allergies  Allergen Reactions   Naproxen Other (See Comments)    (Naprosyn *ANALGESICS - ANTI-INFLAMMATORY*) Nausea, Abdominal pain   Silodosin Other (See Comments)    Low blood pressure    Patient Measurements: Height: 5\' 11"  (180.3 cm) Weight: 89.3 kg (196 lb 12.8 oz) IBW/kg (Calculated) : 75.3  Vital Signs: Temp: 98.2 F (36.8 C) (06/09 1940) Temp Source: Oral (06/09 1940) BP: 159/83 (06/09 1940) Pulse Rate: 90 (06/09 1940)  Labs: Recent Labs    08/28/22 1717 08/28/22 1727 08/29/22 0251 08/29/22 1140 08/29/22 2106  HGB 13.2 14.6 13.8  --   --   HCT 40.9 43.0 42.0  --   --   PLT 209  --  221  --   --   APTT  --   --   --  56* 60*  HEPARINUNFRC  --   --   --  0.65  --   CREATININE 0.80  --  0.87  --   --      Estimated Creatinine Clearance: 81.7 mL/min (by C-G formula based on SCr of 0.87 mg/dL).   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 M75.00)   Allergic rhinitis due to pollen 09/03/2013   J30.1)  Formatting of this note might be different from the original. J30.1)   Allergy    Anticoagulated 07/01/2016   Anxiety    Arthritis    Atrial fibrillation (HCC)    Atrial flutter (HCC) 06/26/2015   CAD (coronary artery disease)    Chest pain 06/26/2015   COPD (chronic obstructive pulmonary disease) (HCC)    Coronary artery disease 07/06/2018   Cardiac catheterization 2017 showing 90% small diagonal branch disease   DDD (degenerative disc disease), cervical 09/03/2013   M50.90)  Formatting of this note might be different from the original. M50.90)   Depression    Depression    Depressive disorder 09/03/2013   Dizziness  10/28/2017   Dyslipidemia, goal LDL below 70 07/06/2018   Dyspnea on exertion 10/20/2018   Esophageal reflux 09/03/2013   Essential hypertension 07/06/2018   ETOH abuse 01/11/2019   6 pack of beer per day   Falls 10/28/2017   GERD (gastroesophageal reflux disease)    H/O amiodarone therapy 07/01/2016   Heart attack (HCC)    Heart palpitations 01/27/2017   History of colon polyps    Hyperlipidemia    Hypertension    IPF (idiopathic pulmonary fibrosis) (HCC)    Kidney stones    Neck pain 09/03/2013   Neuropathy 07/06/2018   Obstructive sleep apnea 08/05/2015   PLMD (periodic limb movement disorder) 11/04/2015   Polycythemia, 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 STORY: Due to alcohol/ tobacco   Pure hypercholesterolemia 09/03/2013   E78.0)  Formatting of this note might be different from the original. E78.0)   Screening for prostate cancer 09/03/2013   Smoker 01/11/2019   Smoking 07/06/2018   Status post ablation of atrial flutter 07/06/2018   2017    Medications:  Medications Prior to Admission  Medication Sig Dispense Refill Last Dose   atorvastatin (LIPITOR) 80 MG tablet Take 1 tablet (80 mg  total) by mouth daily. 90 tablet 2 08/27/2022 at 2000   buPROPion (WELLBUTRIN XL) 300 MG 24 hr tablet Take 1 tablet (300 mg total) by mouth daily. 90 tablet 1 08/28/2022 at 1000   busPIRone (BUSPAR) 15 MG tablet Take 1 tablet (15 mg total) by mouth 3 (three) times daily. 90 tablet 3 08/28/2022 at 0800   Cholecalciferol 25 MCG (1000 UT) tablet Take 1,000 Units by mouth daily.    08/28/2022 at 1000   docusate sodium (COLACE) 100 MG capsule Take 100 mg by mouth daily.   08/28/2022 at 1000   famotidine (PEPCID) 40 MG tablet TAKE 1 TABLET(40 MG) BY MOUTH AT BEDTIME (Patient taking differently: Take 40 mg by mouth at bedtime.) 90 tablet 2 08/27/2022 at 2200   fexofenadine (ALLEGRA) 180 MG tablet Take 180 mg by mouth at bedtime.     08/27/2022 at 2200   finasteride (PROSCAR) 5 MG tablet Take 1 tablet (5 mg total) by mouth daily. 90 tablet 1 08/27/2022 at 2200   Ginger, Zingiber officinalis, (GINGER ROOT) 550 MG CAPS Take 550 mg by mouth daily.   08/28/2022 at 0800   lamoTRIgine (LAMICTAL) 25 MG tablet Take one tablet by mouth 25 mg daily for 14 days, then take two tablets 50 mg total daily. (Patient taking differently: Take 25 mg by mouth daily. started 25mg  08/22/22) 60 tablet 1 08/28/2022 at 1000   Lidocaine 4 % LOTN Use as needed in the bilateral lower extremities (Patient taking differently: Apply 1 application  topically as needed (LE pain). Use as needed in the bilateral lower extremities) 88 mL 0 More than a month   melatonin 5 MG TABS Take 10 mg by mouth at bedtime.   08/27/2022 at 2200   Multiple Vitamins-Minerals (PRESERVISION AREDS PO) Take 1 capsule by mouth in the morning and at bedtime.   08/28/2022 at 0800   nitroGLYCERIN (NITROSTAT) 0.4 MG SL tablet Place 1 tablet (0.4 mg total) under the tongue every 5 (five) minutes as needed for chest pain. 25 tablet 3 Unknown   pantoprazole (PROTONIX) 40 MG tablet TAKE 1 TABLET(40 MG) BY MOUTH DAILY (Patient taking differently: Take 40 mg by mouth daily.) 90 tablet 1 08/28/2022 at 0800   Pirfenidone (ESBRIET) 801 MG TABS Take 1 tablet (801 mg total) by mouth 3 (three) times daily. 270 tablet 1 08/28/2022 at 1200   Polyethylene Glycol 3350 (MIRALAX PO) Take 17 g by mouth daily.   08/28/2022 at 1000   predniSONE (DELTASONE) 10 MG tablet 4 tabs for 2 days, then 3 tabs for 2 days, 2 tabs for 2 days, then 1 tab for 2 days, then stop 20 tablet 0 08/28/2022 at 1000   pregabalin (LYRICA) 150 MG capsule Take 1 capsule (150 mg total) by mouth 2 (two) times daily. 90 capsule 5 08/28/2022 at 1000   ranolazine (RANEXA) 1000 MG SR tablet Take 1 tablet (1,000 mg total) by mouth 2 (two) times daily. 180 tablet 1 08/28/2022 at 1000   sertraline (ZOLOFT) 100 MG tablet Take 1.5 tablets (150 mg total) by mouth at  bedtime. 135 tablet 1 08/27/2022 at 2200   traZODone (DESYREL) 50 MG tablet Take 1 tablet (50 mg total) by mouth at bedtime as needed for sleep (May take 1/2 tab to 1 tablet for sleep, as needed.). 30 tablet 3 08/27/2022 at 2200   vitamin B-12 (CYANOCOBALAMIN) 1000 MCG tablet Take 1,000 mcg by mouth daily.   08/28/2022 at 0800   Vitamin D, Ergocalciferol, (DRISDOL) 1.25 MG (50000  UNIT) CAPS capsule Take 1 capsule (50,000 Units total) by mouth every 7 (seven) days. 8 capsule 0 More than a month   XARELTO 20 MG TABS tablet TAKE 1 TABLET BY MOUTH DAILY WITH SUPPER 30 tablet 5 08/27/2022 at 1700   zolpidem (AMBIEN) 5 MG tablet Take 1 tablet (5 mg total) by mouth at bedtime as needed for sleep. 30 tablet 5 08/27/2022 at 2200   LORazepam (ATIVAN) 0.5 MG tablet Take one tablet by mouth only for severe anxiety or agitation (Patient not taking: Reported on 08/29/2022) 30 tablet 1 Not Taking   Scheduled:   atorvastatin  80 mg Oral Daily   buPROPion  300 mg Oral Daily   busPIRone  15 mg Oral TID   finasteride  5 mg Oral Daily   ipratropium-albuterol  3 mL Nebulization BID   lamoTRIgine  25 mg Oral Daily   Followed by   Melene Muller ON 09/05/2022] lamoTRIgine  50 mg Oral Daily   methylPREDNISolone (SOLU-MEDROL) injection  125 mg Intravenous Q12H   Followed by   Melene Muller ON 08/30/2022] predniSONE  40 mg Oral Q breakfast   pantoprazole  40 mg Oral Daily   Pirfenidone  801 mg Oral TID   pregabalin  150 mg Oral BID   ranolazine  1,000 mg Oral BID   sodium chloride flush  3 mL Intravenous Q12H   Infusions:   sodium chloride 10 mL/hr at 08/29/22 0337   sodium chloride 50 mL/hr at 08/29/22 1541   heparin 1,700 Units/hr (08/29/22 1736)   piperacillin-tazobactam (ZOSYN)  IV 3.375 g (08/29/22 1731)    Assessment: 72yo male c/o SOB x2-3wk, CT reveals small subsegmental PE despite Xarelto therapy PTA for PAF. Last dose of Xarelto reported to be taken 6/7 at 1700. Pharmacy consulted to transition to heparin.  aPTT came back  still slightly subtherapeutic at 60, on heparin 1700 units/hr. No s/sx of bleeding or infusion issues per nursing.  Goal of Therapy:  Heparin level 0.3-0.7 units/ml aPTT 66-102 seconds Monitor platelets by anticoagulation protocol: Yes   Plan:  Increase heparin infusion to 1850 units/hr. Collect follow up aPTT in 8h with AM labs  Monitor heparin levels, aPTT (while DOAC affectings anti-Xa assay), and CBC.  Thank you for allowing pharmacy to participate in this patient's care,  Sherron Monday, PharmD, BCCCP Clinical Pharmacist  Phone: [phone] 08/29/2022 9:47 PM  Please check AMION for all [facility] Pharmacy phone numbers After 10:00 PM, call Main Pharmacy [phone]

## 2022-08-29 NOTE — Consult Note (Signed)
Consulting Physician: Hyman Hopes Tajh Livsey  Referring Provider: Dr. Dartha Lodge  Chief Complaint: SOB, Fatigue  Reason for Consult: Cholecystitis   Subjective   HPI: Robert Lang is an 72 y.o. male who is here for shortness of breath and fatigue.  He is on the medicine service, thought to have a COPD exacerbation, but found to have small pulmonary embolisms and cholecystitis on imaging.  General surgery was consulted to evaluate his gallbladder.  He has had 2-3 weeks of worsening fatigue and shortness of breath with wheezing.  Steroids didn't help.    He ate lunch at [facility] on Tuesday and did not feel well afterwards, threw up.    Last dose of xarelto was Friday night.  I took out his appendix laparoscopically in 2022.  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 M75.00)   Allergic rhinitis due to pollen 09/03/2013   J30.1)  Formatting of this note might be different from the original. J30.1)   Allergy    Anticoagulated 07/01/2016   Anxiety    Arthritis    Atrial fibrillation (HCC)    Atrial flutter (HCC) 06/26/2015   CAD (coronary artery disease)    Chest pain 06/26/2015   COPD (chronic obstructive pulmonary disease) (HCC)    Coronary artery disease 07/06/2018   Cardiac catheterization 2017 showing 90% small diagonal branch disease   DDD (degenerative disc disease), cervical 09/03/2013   M50.90)  Formatting of this note might be different from the original. M50.90)   Depression    Depression    Depressive disorder 09/03/2013   Dizziness 10/28/2017   Dyslipidemia, goal LDL below 70 07/06/2018   Dyspnea on exertion 10/20/2018   Esophageal reflux 09/03/2013   Essential hypertension 07/06/2018   ETOH abuse 01/11/2019   6 pack of beer per day   Falls 10/28/2017   GERD  (gastroesophageal reflux disease)    H/O amiodarone therapy 07/01/2016   Heart attack (HCC)    Heart palpitations 01/27/2017   History of colon polyps    Hyperlipidemia    Hypertension    IPF (idiopathic pulmonary fibrosis) (HCC)    Kidney stones    Neck pain 09/03/2013   Neuropathy 07/06/2018   Obstructive sleep apnea 08/05/2015   PLMD (periodic limb movement disorder) 11/04/2015   Polycythemia, 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 STORY: Due to alcohol/ tobacco   Pure hypercholesterolemia 09/03/2013   E78.0)  Formatting of this note might be different from the original. E78.0)   Screening for prostate cancer 09/03/2013   Smoker 01/11/2019   Smoking 07/06/2018   Status post ablation of atrial flutter 07/06/2018   2017    Past Surgical History:  Procedure Laterality Date   ATRIAL FIBRILLATION ABLATION  10/2015   BACK SURGERY     CARDIOVERSION  2017   CATARACT EXTRACTION Bilateral 2017   March and April 2017   COLONOSCOPY  2018   CORONARY STENT PLACEMENT  2012   CORONARY/GRAFT ACUTE MI REVASCULARIZATION N/A 01/10/2019   Procedure: CORONARY/GRAFT ACUTE MI REVASCULARIZATION;  Surgeon: Marykay Lex, MD;  Location: [facility] INVASIVE CV LAB;  Service: Cardiovascular;  Laterality: N/A;   INGUINAL HERNIA REPAIR     over 20 years ago   LAPAROSCOPIC APPENDECTOMY N/A 01/17/2021  Procedure: APPENDECTOMY LAPAROSCOPIC;  Surgeon: Quentin Ore, MD;  Location: [facility] OR;  Service: General;  Laterality: N/A;   LEFT HEART CATH AND CORONARY ANGIOGRAPHY N/A 01/10/2019   Procedure: LEFT HEART CATH AND CORONARY ANGIOGRAPHY;  Surgeon: Marykay Lex, MD;  Location: [facility] INVASIVE CV LAB;  Service: Cardiovascular;  Laterality: N/A;   LUMBAR LAMINECTOMY Bilateral 04/05/2016   L2-L5    VENTRAL HERNIA REPAIR  2018    Family History  Problem Relation Age of Onset   Anxiety disorder Mother    Alcohol abuse Father     Colon cancer Father    Depression Brother    Alcohol abuse Brother    Throat cancer Brother     Social:  reports that he quit smoking about 3 years ago. His smoking use included cigarettes. He has a 30.00 pack-year smoking history. He has quit using smokeless tobacco. He reports current alcohol use. He reports that he does not use drugs.  Allergies:  Allergies  Allergen Reactions   Naproxen Other (See Comments)    (Naprosyn *ANALGESICS - ANTI-INFLAMMATORY*) Nausea, Abdominal pain   Silodosin Other (See Comments)    Low blood pressure    Medications: Current Outpatient Medications  Medication Instructions   atorvastatin (LIPITOR) 80 mg, Oral, Daily   buPROPion (WELLBUTRIN XL) 300 mg, Oral, Daily   busPIRone (BUSPAR) 15 mg, Oral, 3 times daily   Cholecalciferol 1,000 Units, Oral, Daily   cyanocobalamin (VITAMIN B12) 1,000 mcg, Oral, Daily   docusate sodium (COLACE) 100 mg, Oral, Daily   famotidine (PEPCID) 40 MG tablet TAKE 1 TABLET(40 MG) BY MOUTH AT BEDTIME   fexofenadine (ALLEGRA) 180 mg, Oral, Daily at bedtime   finasteride (PROSCAR) 5 mg, Oral, Daily   Ginger Root 550 mg, Oral, Daily   lamoTRIgine (LAMICTAL) 25 MG tablet Take one tablet by mouth 25 mg daily for 14 days, then take two tablets 50 mg total daily.   Lidocaine 4 % LOTN Use as needed in the bilateral lower extremities   LORazepam (ATIVAN) 0.5 MG tablet Take one tablet by mouth only for severe anxiety or agitation   melatonin 10 mg, Oral, Daily at bedtime   Multiple Vitamins-Minerals (PRESERVISION AREDS PO) 1 capsule, Oral, 2 times daily   nitroGLYCERIN (NITROSTAT) 0.4 mg, Sublingual, Every 5 min PRN   pantoprazole (PROTONIX) 40 MG tablet TAKE 1 TABLET(40 MG) BY MOUTH DAILY   Pirfenidone (ESBRIET) 801 mg, Oral, 3 times daily   Polyethylene Glycol 3350 (MIRALAX PO) 17 g, Oral, Daily   predniSONE (DELTASONE) 10 MG tablet 4 tabs for 2 days, then 3 tabs for 2 days, 2 tabs for 2 days, then 1 tab for 2 days, then stop    pregabalin (LYRICA) 150 mg, Oral, 2 times daily   ranolazine (RANEXA) 1,000 mg, Oral, 2 times daily   sertraline (ZOLOFT) 150 mg, Oral, Daily at bedtime   traZODone (DESYREL) 50 mg, Oral, At bedtime PRN   Vitamin D (Ergocalciferol) (DRISDOL) 50,000 Units, Oral, Every 7 days   Xarelto 20 mg, Oral, Daily with supper   zolpidem (AMBIEN) 5 mg, Oral, At bedtime PRN    ROS - all of the below systems have been reviewed with the patient and positives are indicated with bold text General: chills, fever or night sweats Eyes: blurry vision or double vision ENT: epistaxis or sore throat Allergy/Immunology: itchy/watery eyes or nasal congestion Hematologic/Lymphatic: bleeding problems, blood clots or swollen lymph nodes Endocrine: temperature intolerance or unexpected weight changes Breast: new or changing breast lumps  or nipple discharge Resp: cough, shortness of breath, or wheezing CV: chest pain or dyspnea on exertion GI: as per HPI GU: dysuria, trouble voiding, or hematuria MSK: joint pain or joint stiffness Neuro: TIA or stroke symptoms Derm: pruritus and skin lesion changes Psych: anxiety and depression  Objective   PE Blood pressure (!) 163/87, pulse (!) 101, temperature 98.5 F (36.9 C), temperature source Oral, resp. rate (!) 21, height 5\' 11"  (1.803 m), weight 89.3 kg, SpO2 95 %. Constitutional: NAD; conversant; no deformities Eyes: Moist conjunctiva; no lid lag; anicteric; PERRL Neck: Trachea midline; no thyromegaly Lungs: Normal respiratory effort; no tactile fremitus CV: RRR; no palpable thrills; no pitting edema GI: Abd Soft, tender RUQ; no palpable hepatosplenomegaly MSK: Normal range of motion of extremities; no clubbing/cyanosis Psychiatric: Appropriate affect; alert and oriented x3 Lymphatic: No palpable cervical or axillary lymphadenopathy  Results for orders placed or performed during the hospital encounter of 08/28/22 (from the past 24 hour(s))  CBC with Differential      Status: Abnormal   Collection Time: 08/28/22  5:17 PM  Result Value Ref Range   WBC 12.1 (H) 4.0 - 10.5 K/uL   RBC 4.28 4.22 - 5.81 MIL/uL   Hemoglobin 13.2 13.0 - 17.0 g/dL   HCT 16.1 09.6 - 04.5 %   MCV 95.6 80.0 - 100.0 fL   MCH 30.8 26.0 - 34.0 pg   MCHC 32.3 30.0 - 36.0 g/dL   RDW 40.9 81.1 - 91.4 %   Platelets 209 150 - 400 K/uL   nRBC 0.0 0.0 - 0.2 %   Neutrophils Relative % 87 %   Neutro Abs 10.6 (H) 1.7 - 7.7 K/uL   Lymphocytes Relative 6 %   Lymphs Abs 0.7 0.7 - 4.0 K/uL   Monocytes Relative 6 %   Monocytes Absolute 0.7 0.1 - 1.0 K/uL   Eosinophils Relative 0 %   Eosinophils Absolute 0.0 0.0 - 0.5 K/uL   Basophils Relative 0 %   Basophils Absolute 0.0 0.0 - 0.1 K/uL   Immature Granulocytes 1 %   Abs Immature Granulocytes 0.08 (H) 0.00 - 0.07 K/uL  Basic metabolic panel     Status: Abnormal   Collection Time: 08/28/22  5:17 PM  Result Value Ref Range   Sodium 133 (L) 135 - 145 mmol/L   Potassium 4.4 3.5 - 5.1 mmol/L   Chloride 102 98 - 111 mmol/L   CO2 21 (L) 22 - 32 mmol/L   Glucose, Bld 181 (H) 70 - 99 mg/dL   BUN 14 8 - 23 mg/dL   Creatinine, Ser 7.82 0.61 - 1.24 mg/dL   Calcium 8.5 (L) 8.9 - 10.3 mg/dL   GFR, Estimated >95 >62 mL/min   Anion gap 10 5 - 15  Brain natriuretic peptide     Status: Abnormal   Collection Time: 08/28/22  5:17 PM  Result Value Ref Range   B Natriuretic Peptide 168.3 (H) 0.0 - 100.0 pg/mL  I-Stat venous blood gas, ED     Status: Abnormal   Collection Time: 08/28/22  5:27 PM  Result Value Ref Range   pH, Ven 7.411 7.25 - 7.43   pCO2, Ven 35.7 (L) 44 - 60 mmHg   pO2, Ven 41 32 - 45 mmHg   Bicarbonate 22.7 20.0 - 28.0 mmol/L   TCO2 24 22 - 32 mmol/L   O2 Saturation 77 %   Acid-base deficit 1.0 0.0 - 2.0 mmol/L   Sodium 136 135 - 145 mmol/L   Potassium  4.4 3.5 - 5.1 mmol/L   Calcium, Ion 1.14 (L) 1.15 - 1.40 mmol/L   HCT 43.0 39.0 - 52.0 %   Hemoglobin 14.6 13.0 - 17.0 g/dL   Sample type VENOUS   Basic metabolic panel      Status: Abnormal   Collection Time: 08/29/22  2:51 AM  Result Value Ref Range   Sodium 134 (L) 135 - 145 mmol/L   Potassium 4.1 3.5 - 5.1 mmol/L   Chloride 99 98 - 111 mmol/L   CO2 21 (L) 22 - 32 mmol/L   Glucose, Bld 183 (H) 70 - 99 mg/dL   BUN 13 8 - 23 mg/dL   Creatinine, Ser 1.61 0.61 - 1.24 mg/dL   Calcium 9.2 8.9 - 09.6 mg/dL   GFR, Estimated >04 >54 mL/min   Anion gap 14 5 - 15  Magnesium     Status: None   Collection Time: 08/29/22  2:51 AM  Result Value Ref Range   Magnesium 2.2 1.7 - 2.4 mg/dL  CBC     Status: Abnormal   Collection Time: 08/29/22  2:51 AM  Result Value Ref Range   WBC 11.8 (H) 4.0 - 10.5 K/uL   RBC 4.36 4.22 - 5.81 MIL/uL   Hemoglobin 13.8 13.0 - 17.0 g/dL   HCT 09.8 11.9 - 14.7 %   MCV 96.3 80.0 - 100.0 fL   MCH 31.7 26.0 - 34.0 pg   MCHC 32.9 30.0 - 36.0 g/dL   RDW 82.9 56.2 - 13.0 %   Platelets 221 150 - 400 K/uL   nRBC 0.0 0.0 - 0.2 %  Hepatic function panel     Status: Abnormal   Collection Time: 08/29/22  2:51 AM  Result Value Ref Range   Total Protein 7.2 6.5 - 8.1 g/dL   Albumin 2.8 (L) 3.5 - 5.0 g/dL   AST 31 15 - 41 U/L   ALT 30 0 - 44 U/L   Alkaline Phosphatase 70 38 - 126 U/L   Total Bilirubin 0.4 0.3 - 1.2 mg/dL   Bilirubin, Direct 0.1 0.0 - 0.2 mg/dL   Indirect Bilirubin 0.3 0.3 - 0.9 mg/dL  Lipase, blood     Status: None   Collection Time: 08/29/22  2:51 AM  Result Value Ref Range   Lipase 47 11 - 51 U/L  Strep pneumoniae urinary antigen     Status: None   Collection Time: 08/29/22  4:54 AM  Result Value Ref Range   Strep Pneumo Urinary Antigen NEGATIVE NEGATIVE  Heparin level (unfractionated)     Status: None   Collection Time: 08/29/22 11:40 AM  Result Value Ref Range   Heparin Unfractionated 0.65 0.30 - 0.70 IU/mL  APTT     Status: Abnormal   Collection Time: 08/29/22 11:40 AM  Result Value Ref Range   aPTT 56 (H) 24 - 36 seconds     Imaging Orders         DG Chest 2 View          CT Angio Chest Pulmonary  Embolism (PE) W or WO Contrast     1. Small subsegmental pulmonary emboli in the left lower lobe. There is dilatation of the right ventricle which is unchanged from the prior exam and may be related to right heart failure given small thrombus volume. Correlate clinically to exclude superimposed strain. 2. Trace right pleural effusion with atelectasis at the lung bases bilaterally. 3. Layering hyperdense material in the gallbladder, possible stones or sludge, with gallbladder  wall thickening and surrounding fat stranding, concerning for cholecystitis. Ultrasound is recommended for further evaluation. 4. Three-vessel coronary artery calcifications. 5. Aortic atherosclerosis.       US Abdomen Limited RUQ (LIVER/GB)     Stable hepatic hemangioma.   Gallbladder wall thickening with positive sonographic Murphy's sign. These changes are consistent with cholecystitis in the appropriate clinical setting. The dependent density seen on prior CT is not well appreciated on this exam.   Assessment and Plan   Jaffar Riedman is an 72 y.o. male with multiple medical problems including atrial fibrillation, coronary artery disease and COPD, who presented with shortness of breath and fatigue, found to have pulmonary embolisms and cholecystitis.    I see an ECHO is ordered.  Mr. Rippon needs a preoperative medical evaluation to determine his risks and optimize him for surgery.  I explained to the patient there are two options for treating cholecystitis - laparoscopic cholecystectomy or percutaneous cholecystostomy tube.  He says he wants the darn thing out.  I explained it may make sense to delay surgery and have a percutaneous drain placed in order to avoid prolonged interruption of anticoagulation with new diagnosis of pulmonary embolisms.  The earliest he would be ready for perc drain or surgery would be tomorrow morning due to his xarelto use Friday night.  The surgery team will follow up on the  medical evaluation and provide additional recommendations once the evaluation has been completed.      ICD-10-CM   1. Acute on chronic respiratory failure with hypoxia (HCC)  J96.21        Quentin Ore, MD  [facility] Use [email or web address] to contact on call provider  New Patient Billing: [id number] - High MDM

## 2022-08-29 NOTE — Progress Notes (Signed)
PROGRESS NOTE    Robert Lang  ZHY:[id number] DOB: 26-Feb-1951 DOA: 08/28/2022 PCP: [name], [name]  Outpatient Specialists:     Brief Narrative:  Patient is a 72 year old male with past medical history significant for COPD, IPF, OSA, depression, anxiety, coronary artery disease, paroxysmal atrial fibrillation on Xarelto.  Patient was admitted with worsening shortness of breath and fatigue.  Worsening shortness of breath improved with DuoNeb and steroids.  CT of the chest done as part of patient's workup revealed a small pulmonary embolism.  Xarelto is currently on hold.  Patient is currently on heparin drip.  CT scan of the chest also revealed possible cholecystitis.  Right upper quadrant ultrasound is also suggestive of cholecystitis.  Surgical team has been consulted.  Will change IV cefepime to IV Zosyn.  08/29/2022: Patient seen.  On further questioning, patient tells me that he developed fever, chills, nausea, vomiting and right upper quadrant discomfort about 5 days prior to presentation.  Imaging studies done so far suggests likely cholecystitis.  Surgical team is directing further management.   Assessment & Plan:   Principal Problem:   Acute respiratory failure with hypoxemia (HCC) Active Problems:   Coronary artery disease   COPD (chronic obstructive pulmonary disease) (HCC)   Depressive disorder   CAD (coronary artery disease)   Atrial fibrillation (HCC)   Anxiety   IPF (idiopathic pulmonary fibrosis) (HCC)   OSA (obstructive sleep apnea)   1. Acute hypoxic respiratory failure; COPD; IPF; OSA  -Suspect secondary to COPD exacerbation. -Improved with DuoNebs and steroids. -CTA chest revealed small pulmonary embolism. -Xarelto has been held for now. -Patient is not on heparin drip.   -CT chest also revealed likely cholecystitis.      2. CAD  - No anginal complaints  - Continue Lipitor, Ranexa     3. PAF  -Xarelto is on hold. -Patient is currently on heparin  drip.      4. Depression, anxiety   - Continue Wellbutrin, Buspar, Zoloft, trazodone    Acute cholecystitis: -Recent history of fever, chills, nausea and vomiting in the last 1 week. -CTA chest revealed likely cholecystitis. -Right upper quadrant ultrasound also revealed likely cholecystitis. -IV cefepime has been changed to IV Zosyn. -Surgical team is being consulted.  Surgery team is directing care.    DVT prophylaxis: Heparin drip. Code Status: Full code. Family Communication: Wife. Disposition Plan: Home eventually   Consultants:  Surgery team.  Procedures:  None for now.  Antimicrobials:  IV cefepime has been discontinued. IV Zosyn.   Subjective: No shortness of breath. No fever or chills.   Objective: Vitals:   08/29/22 0051 08/29/22 0357 08/29/22 0741 08/29/22 0809  BP:  (!) 150/84 (!) 143/69   Pulse: 90 94 89 84  Resp:  20 20 20   Temp:  98.2 F (36.8 C) 98.5 F (36.9 C)   TempSrc:  Oral Oral   SpO2: 94% 93% 95% 94%  Weight:      Height:        Intake/Output Summary (Last 24 hours) at 08/29/2022 1043 Last data filed at 08/29/2022 0808 Gross per 24 hour  Intake 483 ml  Output 1100 ml  Net -617 ml   Filed Weights   08/28/22 1622 08/29/22 0014  Weight: 90.7 kg 89.3 kg    Examination:  General exam: Appears calm and comfortable.  Patient is obese. Respiratory system: Decreased air entry. Cardiovascular system: S1 & S2 heard Gastrointestinal system: Abdomen is obese.  Right upper quadrant tenderness on palpation.  Positive Murphy sign. Central nervous system: Alert and oriented.  Patient moves all extremities. Extremities: No leg edema.  Data Reviewed: I have personally reviewed following labs and imaging studies  CBC: Recent Labs  Lab 08/28/22 1717 08/28/22 1727 08/29/22 0251  WBC 12.1*  --  11.8*  NEUTROABS 10.6*  --   --   HGB 13.2 14.6 13.8  HCT 40.9 43.0 42.0  MCV 95.6  --  96.3  PLT 209  --  221   Basic Metabolic Panel: Recent  Labs  Lab 08/28/22 1717 08/28/22 1727 08/29/22 0251  NA 133* 136 134*  K 4.4 4.4 4.1  CL 102  --  99  CO2 21*  --  21*  GLUCOSE 181*  --  183*  BUN 14  --  13  CREATININE 0.80  --  0.87  CALCIUM 8.5*  --  9.2  MG  --   --  2.2   GFR: Estimated Creatinine Clearance: 81.7 mL/min (by C-G formula based on SCr of 0.87 mg/dL). Liver Function Tests: Recent Labs  Lab 08/29/22 0251  AST 31  ALT 30  ALKPHOS 70  BILITOT 0.4  PROT 7.2  ALBUMIN 2.8*   Recent Labs  Lab 08/29/22 0251  LIPASE 47   No results for input(s): "AMMONIA" in the last 168 hours. Coagulation Profile: No results for input(s): "INR", "PROTIME" in the last 168 hours. Cardiac Enzymes: No results for input(s): "CKTOTAL", "CKMB", "CKMBINDEX", "TROPONINI" in the last 168 hours. BNP (last 3 results) No results for input(s): "PROBNP" in the last 8760 hours. HbA1C: No results for input(s): "HGBA1C" in the last 72 hours. CBG: No results for input(s): "GLUCAP" in the last 168 hours. Lipid Profile: No results for input(s): "CHOL", "HDL", "LDLCALC", "TRIG", "CHOLHDL", "LDLDIRECT" in the last 72 hours. Thyroid Function Tests: No results for input(s): "TSH", "T4TOTAL", "FREET4", "T3FREE", "THYROIDAB" in the last 72 hours. Anemia Panel: No results for input(s): "VITAMINB12", "FOLATE", "FERRITIN", "TIBC", "IRON", "RETICCTPCT" in the last 72 hours. Urine analysis:    Component Value Date/Time   COLORURINE YELLOW 08/27/2021 0245   APPEARANCEUR CLEAR 08/27/2021 0245   LABSPEC 1.014 08/27/2021 0245   PHURINE 7.0 08/27/2021 0245   GLUCOSEU NEGATIVE 08/27/2021 0245   HGBUR NEGATIVE 08/27/2021 0245   BILIRUBINUR NEGATIVE 08/27/2021 0245   BILIRUBINUR Negative 09/06/2018 1023   KETONESUR NEGATIVE 08/27/2021 0245   PROTEINUR NEGATIVE 08/27/2021 0245   UROBILINOGEN negative (A) 09/06/2018 1023   NITRITE NEGATIVE 08/27/2021 0245   LEUKOCYTESUR NEGATIVE 08/27/2021 0245   Sepsis  Labs: @LABRCNTIP (procalcitonin:4,lacticidven:4)  )No results found for this or any previous visit (from the past 240 hour(s)).       Radiology Studies: US Abdomen Limited RUQ (LIVER/GB)  Result Date: 08/29/2022 CLINICAL DATA:  Cholelithiasis EXAM: ULTRASOUND ABDOMEN LIMITED RIGHT UPPER QUADRANT COMPARISON:  CT from earlier in the same day, ultrasound from 05/13/2021. FINDINGS: Gallbladder: Gallbladder is well distended. Positive sonographic Murphy's sign is noted. Wall thickening to 4.5 mm is seen. The dependent density seen on prior CT is not well appreciated on this exam. Common bile duct: Diameter: 3 mm Liver: 1.5 cm echogenic focus is noted in the right lobe of the liver consistent with hemangioma. This is stable from prior ultrasound. No other focal abnormality is noted portal vein is patent on color Doppler imaging with normal direction of blood flow towards the liver. Other: None. IMPRESSION: Stable hepatic hemangioma. Gallbladder wall thickening with positive sonographic Murphy's sign. These changes are consistent with cholecystitis in the appropriate clinical setting. The  dependent density seen on prior CT is not well appreciated on this exam. Electronically Signed   By: Alcide Clever M.D.   On: 08/29/2022 10:15   CT Angio Chest Pulmonary Embolism (PE) W or WO Contrast  Result Date: 08/29/2022 CLINICAL DATA:  Pulmonary embolism suspected, high probability. EXAM: CT ANGIOGRAPHY CHEST WITH CONTRAST TECHNIQUE: Multidetector CT imaging of the chest was performed using the standard protocol during bolus administration of intravenous contrast. Multiplanar CT image reconstructions and MIPs were obtained to evaluate the vascular anatomy. RADIATION DOSE REDUCTION: This exam was performed according to the departmental dose-optimization program which includes automated exposure control, adjustment of the mA and/or kV according to patient size and/or use of iterative reconstruction technique. CONTRAST:   75mL OMNIPAQUE IOHEXOL 350 MG/ML SOLN COMPARISON:  08/25/2021. FINDINGS: Cardiovascular: The heart is enlarged and there is a trace pericardial effusion. Three-vessel coronary artery calcifications are noted. There is atherosclerotic calcification of the aorta without evidence of aneurysm. The pulmonary trunk is normal in caliber. Small pulmonary artery filling defects are noted in the subsegmental arteries in the left lower lobe. Evaluation is slightly limited due to respiratory motion. Dilatation of the right ventricle is noted. Mediastinum/Nodes: No enlarged mediastinal, hilar, or axillary lymph nodes. Thyroid gland, trachea, and esophagus demonstrate no significant findings. Lungs/Pleura: There is a trace right pleural effusion with atelectasis at the lung bases. No pneumothorax. Upper Abdomen: Hyperdense material is present in the dependent portion of the urinary bladder, possible stones or sludge. There is gallbladder wall thickening with pericholecystic fat stranding. Musculoskeletal: Degenerative changes are present in the thoracic spine. Kyphoplasty changes are present at T12. No acute osseous abnormality. Review of the MIP images confirms the above findings. IMPRESSION: 1. Small subsegmental pulmonary emboli in the left lower lobe. There is dilatation of the right ventricle which is unchanged from the prior exam and may be related to right heart failure given small thrombus volume. Correlate clinically to exclude superimposed strain. 2. Trace right pleural effusion with atelectasis at the lung bases bilaterally. 3. Layering hyperdense material in the gallbladder, possible stones or sludge, with gallbladder wall thickening and surrounding fat stranding, concerning for cholecystitis. Ultrasound is recommended for further evaluation. 4. Three-vessel coronary artery calcifications. 5. Aortic atherosclerosis. Critical Value/emergent results were called by telephone at the time of interpretation on 08/29/2022 at  3:44 am to provider TIMOTHY OPYD , who verbally acknowledged these results. Electronically Signed   By: Thornell Sartorius M.D.   On: 08/29/2022 03:44   DG Chest 2 View  Result Date: 08/28/2022 CLINICAL DATA:  Dyspnea EXAM: CHEST - 2 VIEW COMPARISON:  09/02/2021 chest radiograph. FINDINGS: Stable cardiomediastinal silhouette with normal heart size. No pneumothorax. No pleural effusion. No pulmonary edema. Mild streaky bibasilar scarring versus atelectasis. No acute consolidative airspace disease. IMPRESSION: Mild streaky bibasilar scarring versus atelectasis. Otherwise no active cardiopulmonary disease. Electronically Signed   By: Delbert Phenix M.D.   On: 08/28/2022 16:59        Scheduled Meds:  atorvastatin  80 mg Oral Daily   buPROPion  300 mg Oral Daily   busPIRone  15 mg Oral TID   finasteride  5 mg Oral Daily   ipratropium-albuterol  3 mL Nebulization BID   lamoTRIgine  25 mg Oral Daily   Followed by   Melene Muller ON 09/05/2022] lamoTRIgine  50 mg Oral Daily   methylPREDNISolone (SOLU-MEDROL) injection  125 mg Intravenous Q12H   Followed by   Melene Muller ON 08/30/2022] predniSONE  40 mg Oral Q breakfast  pantoprazole  40 mg Oral Daily   Pirfenidone  801 mg Oral TID   pregabalin  150 mg Oral BID   ranolazine  1,000 mg Oral BID   sodium chloride flush  3 mL Intravenous Q12H   Continuous Infusions:  sodium chloride 10 mL/hr at 08/29/22 0337   ceFEPime (MAXIPIME) IV 2 g (08/29/22 0339)   heparin 1,500 Units/hr (08/29/22 0431)     LOS: 0 days    Time spent: 55 minutes.    Berton Mount, MD  [facility] Pager #: [phone] 7PM-7AM contact night coverage as above

## 2022-08-29 NOTE — Progress Notes (Signed)
Pharmacy Antibiotic Note  Robert Lang is a 72 y.o. male admitted on 08/28/2022 with  intra-abdominal infection .  Pharmacy has been consulted for zosyn dosing.  Started on cefepime for COPD exacerbation. WBC 11.8 (on steroids), afebrile. Scr 0.8 (CrCl 81 mL/min). Abdominal US showing cholecystitis so antibiotics adjusted to zosyn.  Plan: Zosyn 3.375g IV q8h (4 hour infusion). Monitor renal fx, cx results, clinical pic  Height: 5\' 11"  (180.3 cm) Weight: 89.3 kg (196 lb 12.8 oz) IBW/kg (Calculated) : 75.3  Temp (24hrs), Avg:98.1 F (36.7 C), Min:97.5 F (36.4 C), Max:98.5 F (36.9 C)  Recent Labs  Lab 08/28/22 1717 08/29/22 0251  WBC 12.1* 11.8*  CREATININE 0.80 0.87    Estimated Creatinine Clearance: 81.7 mL/min (by C-G formula based on SCr of 0.87 mg/dL).    Allergies  Allergen Reactions   Naproxen Other (See Comments)    (Naprosyn *ANALGESICS - ANTI-INFLAMMATORY*) Nausea, Abdominal pain   Silodosin Other (See Comments)    Low blood pressure    Antimicrobials this admission: Cefepime  6/9 x2 Zosyn 6/9 >>   Dose adjustments this admission: N/A  Microbiology results: 6/9 Sputum: sent   Thank you for allowing pharmacy to participate in this patient's care,  Sherron Monday, PharmD, BCCCP Clinical Pharmacist  Phone: [phone] 08/29/2022 3:00 PM  Please check AMION for all [facility] Pharmacy phone numbers After 10:00 PM, call Main Pharmacy [phone]

## 2022-08-29 NOTE — Progress Notes (Addendum)
ANTICOAGULATION CONSULT NOTE  Pharmacy Consult for heparin Indication:  acute PE despite Xarelto PTA for PAF  Allergies  Allergen Reactions   Naproxen Other (See Comments)    (Naprosyn *ANALGESICS - ANTI-INFLAMMATORY*) Nausea, Abdominal pain   Silodosin Other (See Comments)    Low blood pressure    Patient Measurements: Height: 5\' 11"  (180.3 cm) Weight: 89.3 kg (196 lb 12.8 oz) IBW/kg (Calculated) : 75.3  Vital Signs: Temp: 98.5 F (36.9 C) (06/09 0741) Temp Source: Oral (06/09 0741) BP: 163/87 (06/09 1120) Pulse Rate: 101 (06/09 1120)  Labs: Recent Labs    08/28/22 1717 08/28/22 1727 08/29/22 0251 08/29/22 1140  HGB 13.2 14.6 13.8  --   HCT 40.9 43.0 42.0  --   PLT 209  --  221  --   APTT  --   --   --  56*  HEPARINUNFRC  --   --   --  0.65  CREATININE 0.80  --  0.87  --      Estimated Creatinine Clearance: 81.7 mL/min (by C-G formula based on SCr of 0.87 mg/dL).   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 M75.00)   Allergic rhinitis due to pollen 09/03/2013   J30.1)  Formatting of this note might be different from the original. J30.1)   Allergy    Anticoagulated 07/01/2016   Anxiety    Arthritis    Atrial fibrillation (HCC)    Atrial flutter (HCC) 06/26/2015   CAD (coronary artery disease)    Chest pain 06/26/2015   COPD (chronic obstructive pulmonary disease) (HCC)    Coronary artery disease 07/06/2018   Cardiac catheterization 2017 showing 90% small diagonal branch disease   DDD (degenerative disc disease), cervical 09/03/2013   M50.90)  Formatting of this note might be different from the original. M50.90)   Depression    Depression    Depressive disorder 09/03/2013   Dizziness 10/28/2017   Dyslipidemia, goal LDL below 70  07/06/2018   Dyspnea on exertion 10/20/2018   Esophageal reflux 09/03/2013   Essential hypertension 07/06/2018   ETOH abuse 01/11/2019   6 pack of beer per day   Falls 10/28/2017   GERD (gastroesophageal reflux disease)    H/O amiodarone therapy 07/01/2016   Heart attack (HCC)    Heart palpitations 01/27/2017   History of colon polyps    Hyperlipidemia    Hypertension    IPF (idiopathic pulmonary fibrosis) (HCC)    Kidney stones    Neck pain 09/03/2013   Neuropathy 07/06/2018   Obstructive sleep apnea 08/05/2015   PLMD (periodic limb movement disorder) 11/04/2015   Polycythemia, 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 STORY: Due to alcohol/ tobacco   Pure hypercholesterolemia 09/03/2013   E78.0)  Formatting of this note might be different from the original. E78.0)   Screening for prostate cancer 09/03/2013   Smoker 01/11/2019   Smoking 07/06/2018   Status post ablation of atrial flutter 07/06/2018   2017    Medications:  Medications Prior to Admission  Medication Sig Dispense Refill Last Dose   atorvastatin (LIPITOR) 80 MG tablet Take 1 tablet (80 mg total) by mouth daily. 90 tablet 2 08/27/2022 at 2000   buPROPion (WELLBUTRIN XL) 300 MG 24  hr tablet Take 1 tablet (300 mg total) by mouth daily. 90 tablet 1 08/28/2022 at 1000   busPIRone (BUSPAR) 15 MG tablet Take 1 tablet (15 mg total) by mouth 3 (three) times daily. 90 tablet 3 08/28/2022 at 0800   Cholecalciferol 25 MCG (1000 UT) tablet Take 1,000 Units by mouth daily.    08/28/2022 at 1000   docusate sodium (COLACE) 100 MG capsule Take 100 mg by mouth daily.   08/28/2022 at 1000   famotidine (PEPCID) 40 MG tablet TAKE 1 TABLET(40 MG) BY MOUTH AT BEDTIME (Patient taking differently: Take 40 mg by mouth at bedtime.) 90 tablet 2 08/27/2022 at 2200   fexofenadine (ALLEGRA) 180 MG tablet Take 180 mg by mouth at bedtime.    08/27/2022 at 2200   finasteride (PROSCAR)  5 MG tablet Take 1 tablet (5 mg total) by mouth daily. 90 tablet 1 08/27/2022 at 2200   Ginger, Zingiber officinalis, (GINGER ROOT) 550 MG CAPS Take 550 mg by mouth daily.   08/28/2022 at 0800   lamoTRIgine (LAMICTAL) 25 MG tablet Take one tablet by mouth 25 mg daily for 14 days, then take two tablets 50 mg total daily. (Patient taking differently: Take 25 mg by mouth daily. started 25mg  08/22/22) 60 tablet 1 08/28/2022 at 1000   Lidocaine 4 % LOTN Use as needed in the bilateral lower extremities (Patient taking differently: Apply 1 application  topically as needed (LE pain). Use as needed in the bilateral lower extremities) 88 mL 0 More than a month   melatonin 5 MG TABS Take 10 mg by mouth at bedtime.   08/27/2022 at 2200   Multiple Vitamins-Minerals (PRESERVISION AREDS PO) Take 1 capsule by mouth in the morning and at bedtime.   08/28/2022 at 0800   nitroGLYCERIN (NITROSTAT) 0.4 MG SL tablet Place 1 tablet (0.4 mg total) under the tongue every 5 (five) minutes as needed for chest pain. 25 tablet 3 Unknown   pantoprazole (PROTONIX) 40 MG tablet TAKE 1 TABLET(40 MG) BY MOUTH DAILY (Patient taking differently: Take 40 mg by mouth daily.) 90 tablet 1 08/28/2022 at 0800   Pirfenidone (ESBRIET) 801 MG TABS Take 1 tablet (801 mg total) by mouth 3 (three) times daily. 270 tablet 1 08/28/2022 at 1200   Polyethylene Glycol 3350 (MIRALAX PO) Take 17 g by mouth daily.   08/28/2022 at 1000   predniSONE (DELTASONE) 10 MG tablet 4 tabs for 2 days, then 3 tabs for 2 days, 2 tabs for 2 days, then 1 tab for 2 days, then stop 20 tablet 0 08/28/2022 at 1000   pregabalin (LYRICA) 150 MG capsule Take 1 capsule (150 mg total) by mouth 2 (two) times daily. 90 capsule 5 08/28/2022 at 1000   ranolazine (RANEXA) 1000 MG SR tablet Take 1 tablet (1,000 mg total) by mouth 2 (two) times daily. 180 tablet 1 08/28/2022 at 1000   sertraline (ZOLOFT) 100 MG tablet Take 1.5 tablets (150 mg total) by mouth at bedtime. 135 tablet 1 08/27/2022 at 2200   traZODone  (DESYREL) 50 MG tablet Take 1 tablet (50 mg total) by mouth at bedtime as needed for sleep (May take 1/2 tab to 1 tablet for sleep, as needed.). 30 tablet 3 08/27/2022 at 2200   vitamin B-12 (CYANOCOBALAMIN) 1000 MCG tablet Take 1,000 mcg by mouth daily.   08/28/2022 at 0800   Vitamin D, Ergocalciferol, (DRISDOL) 1.25 MG (50000 UNIT) CAPS capsule Take 1 capsule (50,000 Units total) by mouth every 7 (seven) days. 8 capsule 0  More than a month   XARELTO 20 MG TABS tablet TAKE 1 TABLET BY MOUTH DAILY WITH SUPPER 30 tablet 5 08/27/2022 at 1700   zolpidem (AMBIEN) 5 MG tablet Take 1 tablet (5 mg total) by mouth at bedtime as needed for sleep. 30 tablet 5 08/27/2022 at 2200   LORazepam (ATIVAN) 0.5 MG tablet Take one tablet by mouth only for severe anxiety or agitation (Patient not taking: Reported on 08/29/2022) 30 tablet 1 Not Taking   Scheduled:   atorvastatin  80 mg Oral Daily   buPROPion  300 mg Oral Daily   busPIRone  15 mg Oral TID   finasteride  5 mg Oral Daily   ipratropium-albuterol  3 mL Nebulization BID   lamoTRIgine  25 mg Oral Daily   Followed by   Melene Muller ON 09/05/2022] lamoTRIgine  50 mg Oral Daily   methylPREDNISolone (SOLU-MEDROL) injection  125 mg Intravenous Q12H   Followed by   Melene Muller ON 08/30/2022] predniSONE  40 mg Oral Q breakfast   pantoprazole  40 mg Oral Daily   Pirfenidone  801 mg Oral TID   pregabalin  150 mg Oral BID   ranolazine  1,000 mg Oral BID   sodium chloride flush  3 mL Intravenous Q12H   Infusions:   sodium chloride 10 mL/hr at 08/29/22 0337   ceFEPime (MAXIPIME) IV 2 g (08/29/22 1201)   heparin 1,500 Units/hr (08/29/22 0431)    Assessment: 72yo male c/o SOB x2-3wk, CT reveals small subsegmental PE despite Xarelto therapy PTA for PAF. Pharmacy consulted to transition to heparin.  last dose of Xarelto reported to be taken 6/7 at 1700.  APTT is 56, subtherapeutic, but heparin level is therapeutic at 0.65. Heparin level close to correlating after DOAC use. Hgb and  plt remain wnl. No concerns for bleeding noted.    Goal of Therapy:  Heparin level 0.3-0.7 units/ml aPTT 66-102 seconds Monitor platelets by anticoagulation protocol: Yes   Plan:  Increase infusion to 1700 units/hr. Collect follow up aPTT in 8h Monitor heparin levels, aPTT (while DOAC affectings anti-Xa assay), and CBC.  Rennis Petty, PharmD PGY1 Pharmacy Resident 08/29/2022 12:37 PM

## 2022-08-29 NOTE — H&P (Addendum)
History and Physical    Isamu Gundy WGN:[id number] DOB: 1951/01/23 DOA: 08/28/2022  PCP: [name], [name]   Patient coming from: Home   Chief Complaint: SOB, fatigue   HPI: Naziah Urda is a pleasant 72 y.o. male with medical history significant for IPF, COPD, OSA, depression, anxiety, CAD, and PAF on Xarelto who presents emergency department with progressive shortness of breath and fatigue.  Patient reports 2 to 3 weeks of worsening fatigue and shortness of breath with wheezing.  This is despite a course of steroids outpatient.  He denies any associated chest pain, fever, chills, or leg swelling.  He has only a mild cough.  He reports saturations in the low to mid 80s at home.  [facility] ED Course: Upon arrival to the ED, patient is found to be afebrile and saturating low to mid 90s on 2 L/min of supplemental oxygen with mild tachypnea and stable blood pressure.  EKG demonstrates sinus rhythm first-degree of nodal block, RBBB, and LAFB.  Chest x-ray notable for mild streaky bibasilar atelectasis or scarring.  Labs are notable for WBC 12,100 and BNP 168.  Patient was treated with 125 mg of IV Solu-Medrol and DuoNeb x 2 in the ED.  He was transferred to [facility] for admission.  Review of Systems:  All other systems reviewed and apart from HPI, are negative.  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 M75.00)   Allergic rhinitis due to pollen 09/03/2013   J30.1)  Formatting of this note might be different from the original. J30.1)   Allergy    Anticoagulated 07/01/2016   Anxiety    Arthritis    Atrial fibrillation (HCC)    Atrial flutter (HCC) 06/26/2015   CAD (coronary artery disease)    Chest pain 06/26/2015   COPD (chronic obstructive pulmonary disease) (HCC)     Coronary artery disease 07/06/2018   Cardiac catheterization 2017 showing 90% small diagonal branch disease   DDD (degenerative disc disease), cervical 09/03/2013   M50.90)  Formatting of this note might be different from the original. M50.90)   Depression    Depression    Depressive disorder 09/03/2013   Dizziness 10/28/2017   Dyslipidemia, goal LDL below 70 07/06/2018   Dyspnea on exertion 10/20/2018   Esophageal reflux 09/03/2013   Essential hypertension 07/06/2018   ETOH abuse 01/11/2019   6 pack of beer per day   Falls 10/28/2017   GERD (gastroesophageal reflux disease)    H/O amiodarone therapy 07/01/2016   Heart attack (HCC)    Heart palpitations 01/27/2017   History of colon polyps    Hyperlipidemia    Hypertension    IPF (idiopathic pulmonary fibrosis) (HCC)    Kidney stones    Neck pain 09/03/2013   Neuropathy 07/06/2018   Obstructive sleep apnea 08/05/2015   PLMD (periodic limb movement disorder) 11/04/2015   Polycythemia, 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 STORY: Due to alcohol/ tobacco   Pure hypercholesterolemia 09/03/2013   E78.0)  Formatting of this note might be different from the original. E78.0)   Screening for prostate cancer 09/03/2013   Smoker 01/11/2019   Smoking 07/06/2018   Status post ablation of atrial flutter 07/06/2018   2017    Past Surgical  History:  Procedure Laterality Date   ATRIAL FIBRILLATION ABLATION  10/2015   BACK SURGERY     CARDIOVERSION  2017   CATARACT EXTRACTION Bilateral 2017   March and April 2017   COLONOSCOPY  2018   CORONARY STENT PLACEMENT  2012   CORONARY/GRAFT ACUTE MI REVASCULARIZATION N/A 01/10/2019   Procedure: CORONARY/GRAFT ACUTE MI REVASCULARIZATION;  Surgeon: Marykay Lex, MD;  Location: [facility] INVASIVE CV LAB;  Service: Cardiovascular;  Laterality: N/A;   INGUINAL HERNIA REPAIR     over 20 years ago   LAPAROSCOPIC  APPENDECTOMY N/A 01/17/2021   Procedure: APPENDECTOMY LAPAROSCOPIC;  Surgeon: Quentin Ore, MD;  Location: [facility] OR;  Service: General;  Laterality: N/A;   LEFT HEART CATH AND CORONARY ANGIOGRAPHY N/A 01/10/2019   Procedure: LEFT HEART CATH AND CORONARY ANGIOGRAPHY;  Surgeon: Marykay Lex, MD;  Location: [facility] INVASIVE CV LAB;  Service: Cardiovascular;  Laterality: N/A;   LUMBAR LAMINECTOMY Bilateral 04/05/2016   L2-L5    VENTRAL HERNIA REPAIR  2018    Social History:   reports that he quit smoking about 3 years ago. His smoking use included cigarettes. He has a 30.00 pack-year smoking history. He has quit using smokeless tobacco. He reports current alcohol use. He reports that he does not use drugs.  Allergies  Allergen Reactions   Naproxen Other (See Comments)    (Naprosyn *ANALGESICS - ANTI-INFLAMMATORY*) Nausea, Abdominal pain   Silodosin Other (See Comments)    Low blood pressure    Family History  Problem Relation Age of Onset   Anxiety disorder Mother    Alcohol abuse Father    Colon cancer Father    Depression Brother    Alcohol abuse Brother    Throat cancer Brother      Prior to Admission medications   Medication Sig Start Date End Date Taking? Authorizing Provider  buPROPion (WELLBUTRIN XL) 300 MG 24 hr tablet Take 1 tablet (300 mg total) by mouth daily. 08/20/22  Yes White, Arlys John A, NP  busPIRone (BUSPAR) 15 MG tablet Take 1 tablet (15 mg total) by mouth 3 (three) times daily. 08/20/22  Yes Joan Flores, NP  Cholecalciferol 25 MCG (1000 UT) tablet Take 1,000 Units by mouth daily.    Yes [provider]  docusate sodium (COLACE) 100 MG capsule Take 100 mg by mouth daily.   Yes [provider]  famotidine (PEPCID) 40 MG tablet TAKE 1 TABLET(40 MG) BY MOUTH AT BEDTIME 08/11/22  Yes Georgeanna Lea, MD  fexofenadine (ALLEGRA) 180 MG tablet Take 180 mg by mouth at bedtime.  04/09/08  Yes [provider]  finasteride (PROSCAR) 5 MG  tablet Take 1 tablet (5 mg total) by mouth daily. 03/10/21  Yes Georgeanna Lea, MD  Ginger, Zingiber officinalis, (GINGER ROOT) 550 MG CAPS Take 550 mg by mouth daily.   Yes [provider]  Multiple Vitamins-Minerals (PRESERVISION AREDS PO) Take 1 capsule by mouth in the morning and at bedtime.   Yes [provider]  pantoprazole (PROTONIX) 40 MG tablet TAKE 1 TABLET(40 MG) BY MOUTH DAILY 07/01/22  Yes Lynann Bologna, MD  Pirfenidone (ESBRIET) 801 MG TABS Take 1 tablet (801 mg total) by mouth 3 (three) times daily. 06/24/22  Yes Kalman Shan, MD  Polyethylene Glycol 3350 (MIRALAX PO) Take 17 g by mouth daily.   Yes [provider]  pregabalin (LYRICA) 150 MG capsule Take 1 capsule (150 mg total) by mouth 2 (two) times daily. 07/15/22  Yes Camara,  Amadou, MD  ranolazine (RANEXA) 1000 MG SR tablet Take 1 tablet (1,000 mg total) by mouth 2 (two) times daily. 05/27/22  Yes Georgeanna Lea, MD  vitamin B-12 (CYANOCOBALAMIN) 1000 MCG tablet Take 1,000 mcg by mouth daily.   Yes [provider]  atorvastatin (LIPITOR) 80 MG tablet Take 1 tablet (80 mg total) by mouth daily. 04/28/22   Georgeanna Lea, MD  lamoTRIgine (LAMICTAL) 25 MG tablet Take one tablet by mouth 25 mg daily for 14 days, then take two tablets 50 mg total daily. Patient taking differently: Take 25 mg by mouth daily. started 25mg  08/22/22 08/20/22   Joan Flores, NP  Lidocaine 4 % LOTN Use as needed in the bilateral lower extremities Patient taking differently: Apply 1 application  topically as needed (LE pain). Use as needed in the bilateral lower extremities 03/01/22   Windell Norfolk, MD  LORazepam (ATIVAN) 0.5 MG tablet Take one tablet by mouth only for severe anxiety or agitation 08/20/22   Avelina Laine A, NP  melatonin 5 MG TABS Take 10 mg by mouth at bedtime.    [provider]  nitroGLYCERIN (NITROSTAT) 0.4 MG SL tablet Place 1 tablet (0.4 mg total) under the tongue every 5 (five)  minutes as needed for chest pain. 03/10/21   Georgeanna Lea, MD  predniSONE (DELTASONE) 10 MG tablet 4 tabs for 2 days, then 3 tabs for 2 days, 2 tabs for 2 days, then 1 tab for 2 days, then stop 08/27/22   Glenford Bayley, NP  sertraline (ZOLOFT) 100 MG tablet Take 1.5 tablets (150 mg total) by mouth at bedtime. 06/28/22   Joan Flores, NP  traZODone (DESYREL) 50 MG tablet Take 1 tablet (50 mg total) by mouth at bedtime as needed for sleep (May take 1/2 tab to 1 tablet for sleep, as needed.). 06/28/22   Joan Flores, NP  Vitamin D, Ergocalciferol, (DRISDOL) 1.25 MG (50000 UNIT) CAPS capsule Take 1 capsule (50,000 Units total) by mouth every 7 (seven) days. 05/19/22   [name], [name], [name]  XARELTO 20 MG TABS tablet TAKE 1 TABLET BY MOUTH DAILY WITH SUPPER 08/23/22   Georgeanna Lea, MD  zolpidem (AMBIEN) 5 MG tablet Take 1 tablet (5 mg total) by mouth at bedtime as needed for sleep. 06/15/22 12/12/22  Windell Norfolk, MD    Physical Exam: Vitals:   08/28/22 2100 08/28/22 2123 08/29/22 0014 08/29/22 0051  BP: (!) 164/92  (!) 150/86   Pulse: 89  99 90  Resp: 15  16   Temp:  98 F (36.7 C) 98.1 F (36.7 C)   TempSrc:  Oral Oral   SpO2: 92%  97% 94%  Weight:   89.3 kg   Height:        Constitutional: NAD, no pallor or diaphoresis   Eyes: PERTLA, lids and conjunctivae normal ENMT: Mucous membranes are moist. Posterior pharynx clear of any exudate or lesions.   Neck: supple, no masses  Respiratory: Diminished breath sounds b/l, fine rales bilaterally. Dyspneic with speech.   Cardiovascular: S1 & S2 heard, regular rate and rhythm. No extremity edema.  Abdomen: No distension, no tenderness, soft. Bowel sounds active.  Musculoskeletal: no clubbing / cyanosis. No joint deformity upper and lower extremities.   Skin: no significant rashes, lesions, ulcers. Warm, dry, well-perfused. Neurologic: CN 2-12 grossly intact. Moving all extremities. Alert and oriented.  Psychiatric: Calm.  Cooperative.    Labs and Imaging on Admission: I have personally reviewed following labs  and imaging studies  CBC: Recent Labs  Lab 08/28/22 1717 08/28/22 1727  WBC 12.1*  --   NEUTROABS 10.6*  --   HGB 13.2 14.6  HCT 40.9 43.0  MCV 95.6  --   PLT 209  --    Basic Metabolic Panel: Recent Labs  Lab 08/28/22 1717 08/28/22 1727  NA 133* 136  K 4.4 4.4  CL 102  --   CO2 21*  --   GLUCOSE 181*  --   BUN 14  --   CREATININE 0.80  --   CALCIUM 8.5*  --    GFR: Estimated Creatinine Clearance: 88.9 mL/min (by C-G formula based on SCr of 0.8 mg/dL). Liver Function Tests: No results for input(s): "AST", "ALT", "ALKPHOS", "BILITOT", "PROT", "ALBUMIN" in the last 168 hours. No results for input(s): "LIPASE", "AMYLASE" in the last 168 hours. No results for input(s): "AMMONIA" in the last 168 hours. Coagulation Profile: No results for input(s): "INR", "PROTIME" in the last 168 hours. Cardiac Enzymes: No results for input(s): "CKTOTAL", "CKMB", "CKMBINDEX", "TROPONINI" in the last 168 hours. BNP (last 3 results) No results for input(s): "PROBNP" in the last 8760 hours. HbA1C: No results for input(s): "HGBA1C" in the last 72 hours. CBG: No results for input(s): "GLUCAP" in the last 168 hours. Lipid Profile: No results for input(s): "CHOL", "HDL", "LDLCALC", "TRIG", "CHOLHDL", "LDLDIRECT" in the last 72 hours. Thyroid Function Tests: No results for input(s): "TSH", "T4TOTAL", "FREET4", "T3FREE", "THYROIDAB" in the last 72 hours. Anemia Panel: No results for input(s): "VITAMINB12", "FOLATE", "FERRITIN", "TIBC", "IRON", "RETICCTPCT" in the last 72 hours. Urine analysis:    Component Value Date/Time   COLORURINE YELLOW 08/27/2021 0245   APPEARANCEUR CLEAR 08/27/2021 0245   LABSPEC 1.014 08/27/2021 0245   PHURINE 7.0 08/27/2021 0245   GLUCOSEU NEGATIVE 08/27/2021 0245   HGBUR NEGATIVE 08/27/2021 0245   BILIRUBINUR NEGATIVE 08/27/2021 0245   BILIRUBINUR Negative 09/06/2018  1023   KETONESUR NEGATIVE 08/27/2021 0245   PROTEINUR NEGATIVE 08/27/2021 0245   UROBILINOGEN negative (A) 09/06/2018 1023   NITRITE NEGATIVE 08/27/2021 0245   LEUKOCYTESUR NEGATIVE 08/27/2021 0245   Sepsis Labs: @LABRCNTIP (procalcitonin:4,lacticidven:4) )No results found for this or any previous visit (from the past 240 hour(s)).   Radiological Exams on Admission: DG Chest 2 View  Result Date: 08/28/2022 CLINICAL DATA:  Dyspnea EXAM: CHEST - 2 VIEW COMPARISON:  09/02/2021 chest radiograph. FINDINGS: Stable cardiomediastinal silhouette with normal heart size. No pneumothorax. No pleural effusion. No pulmonary edema. Mild streaky bibasilar scarring versus atelectasis. No acute consolidative airspace disease. IMPRESSION: Mild streaky bibasilar scarring versus atelectasis. Otherwise no active cardiopulmonary disease. Electronically Signed   By: Delbert Phenix M.D.   On: 08/28/2022 16:59    EKG: Independently reviewed. Sinus rhythm, 1st degree AV block, RBBB, LAFB.   Assessment/Plan   1. Acute hypoxic respiratory failure; COPD; IPF; OSA  - 2-3 wks of progressive SOB and wheezing despite outpatient course of steroids and is found to have saturation 88% at rest and 85% with mild exertion  - He reports improvement with DuoNebs and systemic steroids in ED  - Check CTA chest, culture sputum, check strep pneumo and legionella antigens, continue systemic steroid, start cefepime, continue breathing treatments, continue supplemental O2 as needed, continue CPAP qHS, wife is bringing his pirfenidone tomorrow  ADDENDUM: Notified by radiology that there is small subsegmental PE on the CTA, dilation of RV (not seen on echo in February 2024), as well as findings worrisome for cholecystitis. Plan to switch Xarelto to IV  heparin for now, check TTE and RUQ [place].    2. CAD  - No anginal complaints  - Continue Lipitor, Ranexa    3. PAF  - Continue Xarelto   4. Depression, anxiety   - Continue Wellbutrin,  Buspar, Zoloft, trazodone     DVT prophylaxis: Xarelto  Code Status: Full  Level of Care: Level of care: Telemetry Medical Family Communication: none present  Disposition Plan:  Patient is from: home  Anticipated d/c is to: Home  Anticipated d/c date is: 08/30/22  Patient currently: pending CTA chest and improved oxygenation, suspect he may need home O2  Consults called: None  Admission status: Observation     Briscoe Deutscher, MD [facility]  08/29/2022, 2:12 AM

## 2022-08-30 ENCOUNTER — Inpatient Hospital Stay (HOSPITAL_COMMUNITY): Payer: Medicare Other

## 2022-08-30 ENCOUNTER — Telehealth: Payer: Self-pay | Admitting: Internal Medicine

## 2022-08-30 DIAGNOSIS — J9601 Acute respiratory failure with hypoxia: Secondary | ICD-10-CM | POA: Diagnosis not present

## 2022-08-30 HISTORY — PX: IR PERC CHOLECYSTOSTOMY: IMG2326

## 2022-08-30 LAB — BASIC METABOLIC PANEL
Anion gap: 11 (ref 5–15)
BUN: 15 mg/dL (ref 8–23)
CO2: 25 mmol/L (ref 22–32)
Calcium: 9.1 mg/dL (ref 8.9–10.3)
Chloride: 101 mmol/L (ref 98–111)
Creatinine, Ser: 0.92 mg/dL (ref 0.61–1.24)
GFR, Estimated: >60 mL/min (ref >60)
Glucose, Bld: 163 mg/dL — ABNORMAL HIGH (ref 70–99)
Potassium: 4.5 mmol/L (ref 3.5–5.1)
Sodium: 137 mmol/L (ref 135–145)

## 2022-08-30 LAB — CBC
HCT: 47.4 % (ref 39.0–52.0)
Hemoglobin: 15 g/dL (ref 13.0–17.0)
MCH: 31.3 pg (ref 26.0–34.0)
MCHC: 31.6 g/dL (ref 30.0–36.0)
MCV: 98.8 fL (ref 80.0–100.0)
Platelets: 287 10*3/uL (ref 150–400)
RBC: 4.8 MIL/uL (ref 4.22–5.81)
RDW: 13.6 % (ref 11.5–15.5)
WBC: 12 10*3/uL — ABNORMAL HIGH (ref 4.0–10.5)
nRBC: 0 % (ref 0.0–0.2)

## 2022-08-30 LAB — PROTIME-INR
INR: 1.1 (ref 0.8–1.2)
Prothrombin Time: 14.5 seconds (ref 11.4–15.2)

## 2022-08-30 LAB — APTT: aPTT: 77 seconds — ABNORMAL HIGH (ref 24–36)

## 2022-08-30 LAB — AEROBIC/ANAEROBIC CULTURE W GRAM STAIN (SURGICAL/DEEP WOUND)

## 2022-08-30 LAB — CULTURE, RESPIRATORY W GRAM STAIN

## 2022-08-30 LAB — HEPARIN LEVEL (UNFRACTIONATED): Heparin Unfractionated: 0.92 IU/mL — ABNORMAL HIGH (ref 0.30–0.70)

## 2022-08-30 MED ORDER — FENTANYL CITRATE (PF) 100 MCG/2ML IJ SOLN
INTRAMUSCULAR | Status: AC | PRN
  Administered 2022-08-30 (×3): 25 ug via INTRAVENOUS

## 2022-08-30 MED ORDER — SODIUM CHLORIDE 0.9 % IV SOLN
INTRAVENOUS | Status: AC | PRN
  Administered 2022-08-30: 2 g via INTRAVENOUS

## 2022-08-30 MED ORDER — SODIUM CHLORIDE 0.9% FLUSH
5.0000 mL | Freq: Three times a day (TID) | INTRAVENOUS | Status: DC
  Administered 2022-08-30 – 2022-09-01 (×5): 5 mL

## 2022-08-30 MED ORDER — LIDOCAINE HCL 1 % IJ SOLN
INTRAMUSCULAR | Status: AC
  Filled 2022-08-30: qty 20

## 2022-08-30 MED ORDER — MIDAZOLAM HCL 2 MG/2ML IJ SOLN
INTRAMUSCULAR | Status: AC | PRN
  Administered 2022-08-30 (×2): .5 mg via INTRAVENOUS

## 2022-08-30 MED ORDER — IOHEXOL 300 MG/ML  SOLN
50.0000 mL | Freq: Once | INTRAMUSCULAR | Status: AC | PRN
  Administered 2022-08-30: 10 mL

## 2022-08-30 MED ORDER — MIDAZOLAM HCL 2 MG/2ML IJ SOLN
INTRAMUSCULAR | Status: AC
  Filled 2022-08-30: qty 2

## 2022-08-30 MED ORDER — FENTANYL CITRATE (PF) 100 MCG/2ML IJ SOLN
INTRAMUSCULAR | Status: AC
  Filled 2022-08-30: qty 2

## 2022-08-30 MED ORDER — OXYCODONE HCL 5 MG PO TABS
5.0000 mg | ORAL_TABLET | Freq: Four times a day (QID) | ORAL | Status: DC | PRN
  Administered 2022-08-30 – 2022-08-31 (×2): 5 mg via ORAL
  Filled 2022-08-30 (×2): qty 1

## 2022-08-30 MED ORDER — SODIUM CHLORIDE 0.9 % IV SOLN
INTRAVENOUS | Status: AC
  Filled 2022-08-30: qty 2

## 2022-08-30 NOTE — Progress Notes (Signed)
ANTICOAGULATION CONSULT NOTE  Pharmacy Consult for heparin Indication:  acute PE despite Xarelto PTA for PAF  Allergies  Allergen Reactions   Naproxen Other (See Comments)    (Naprosyn *ANALGESICS - ANTI-INFLAMMATORY*) Nausea, Abdominal pain   Silodosin Other (See Comments)    Low blood pressure    Patient Measurements: Height: 5\' 11"  (180.3 cm) Weight: 87.9 kg (193 lb 12.8 oz) IBW/kg (Calculated) : 75.3  Vital Signs: Temp: 98.3 F (36.8 C) (06/10 0826) Temp Source: Oral (06/10 0826) BP: 138/82 (06/10 0826) Pulse Rate: 86 (06/10 0826)  Labs: Recent Labs    08/28/22 1717 08/28/22 1727 08/29/22 0251 08/29/22 1140 08/29/22 2106 08/30/22 0632  HGB 13.2 14.6 13.8  --   --  15.0  HCT 40.9 43.0 42.0  --   --  47.4  PLT 209  --  221  --   --  287  APTT  --   --   --  56* 60* 77*  HEPARINUNFRC  --   --   --  0.65  --  0.92*  CREATININE 0.80  --  0.87  --   --  0.92     Estimated Creatinine Clearance: 77.3 mL/min (by C-G formula based on SCr of 0.92 mg/dL).   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 M75.00)   Allergic rhinitis due to pollen 09/03/2013   J30.1)  Formatting of this note might be different from the original. J30.1)   Allergy    Anticoagulated 07/01/2016   Anxiety    Arthritis    Atrial fibrillation (HCC)    Atrial flutter (HCC) 06/26/2015   CAD (coronary artery disease)    Chest pain 06/26/2015   COPD (chronic obstructive pulmonary disease) (HCC)    Coronary artery disease 07/06/2018   Cardiac catheterization 2017 showing 90% small diagonal branch disease   DDD (degenerative disc disease), cervical 09/03/2013   M50.90)  Formatting of this note might be different from the original. M50.90)   Depression    Depression     Depressive disorder 09/03/2013   Dizziness 10/28/2017   Dyslipidemia, goal LDL below 70 07/06/2018   Dyspnea on exertion 10/20/2018   Esophageal reflux 09/03/2013   Essential hypertension 07/06/2018   ETOH abuse 01/11/2019   6 pack of beer per day   Falls 10/28/2017   GERD (gastroesophageal reflux disease)    H/O amiodarone therapy 07/01/2016   Heart attack (HCC)    Heart palpitations 01/27/2017   History of colon polyps    Hyperlipidemia    Hypertension    IPF (idiopathic pulmonary fibrosis) (HCC)    Kidney stones    Neck pain 09/03/2013   Neuropathy 07/06/2018   Obstructive sleep apnea 08/05/2015   PLMD (periodic limb movement disorder) 11/04/2015   Polycythemia, 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 STORY: Due to alcohol/ tobacco   Pure hypercholesterolemia 09/03/2013   E78.0)  Formatting of this note might be different from the original. E78.0)   Screening for prostate cancer 09/03/2013   Smoker 01/11/2019   Smoking 07/06/2018   Status post ablation of atrial flutter 07/06/2018   2017    Medications:  Medications Prior to Admission  Medication Sig Dispense Refill Last Dose   atorvastatin (LIPITOR)  80 MG tablet Take 1 tablet (80 mg total) by mouth daily. 90 tablet 2 08/27/2022 at 2000   buPROPion (WELLBUTRIN XL) 300 MG 24 hr tablet Take 1 tablet (300 mg total) by mouth daily. 90 tablet 1 08/28/2022 at 1000   busPIRone (BUSPAR) 15 MG tablet Take 1 tablet (15 mg total) by mouth 3 (three) times daily. 90 tablet 3 08/28/2022 at 0800   Cholecalciferol 25 MCG (1000 UT) tablet Take 1,000 Units by mouth daily.    08/28/2022 at 1000   docusate sodium (COLACE) 100 MG capsule Take 100 mg by mouth daily.   08/28/2022 at 1000   famotidine (PEPCID) 40 MG tablet TAKE 1 TABLET(40 MG) BY MOUTH AT BEDTIME (Patient taking differently: Take 40 mg by mouth at bedtime.) 90 tablet 2 08/27/2022 at 2200   fexofenadine (ALLEGRA)  180 MG tablet Take 180 mg by mouth at bedtime.    08/27/2022 at 2200   finasteride (PROSCAR) 5 MG tablet Take 1 tablet (5 mg total) by mouth daily. 90 tablet 1 08/27/2022 at 2200   Ginger, Zingiber officinalis, (GINGER ROOT) 550 MG CAPS Take 550 mg by mouth daily.   08/28/2022 at 0800   lamoTRIgine (LAMICTAL) 25 MG tablet Take one tablet by mouth 25 mg daily for 14 days, then take two tablets 50 mg total daily. (Patient taking differently: Take 25 mg by mouth daily. started 25mg  08/22/22) 60 tablet 1 08/28/2022 at 1000   Lidocaine 4 % LOTN Use as needed in the bilateral lower extremities (Patient taking differently: Apply 1 application  topically as needed (LE pain). Use as needed in the bilateral lower extremities) 88 mL 0 More than a month   melatonin 5 MG TABS Take 10 mg by mouth at bedtime.   08/27/2022 at 2200   Multiple Vitamins-Minerals (PRESERVISION AREDS PO) Take 1 capsule by mouth in the morning and at bedtime.   08/28/2022 at 0800   nitroGLYCERIN (NITROSTAT) 0.4 MG SL tablet Place 1 tablet (0.4 mg total) under the tongue every 5 (five) minutes as needed for chest pain. 25 tablet 3 Unknown   pantoprazole (PROTONIX) 40 MG tablet TAKE 1 TABLET(40 MG) BY MOUTH DAILY (Patient taking differently: Take 40 mg by mouth daily.) 90 tablet 1 08/28/2022 at 0800   Pirfenidone (ESBRIET) 801 MG TABS Take 1 tablet (801 mg total) by mouth 3 (three) times daily. 270 tablet 1 08/28/2022 at 1200   Polyethylene Glycol 3350 (MIRALAX PO) Take 17 g by mouth daily.   08/28/2022 at 1000   predniSONE (DELTASONE) 10 MG tablet 4 tabs for 2 days, then 3 tabs for 2 days, 2 tabs for 2 days, then 1 tab for 2 days, then stop 20 tablet 0 08/28/2022 at 1000   pregabalin (LYRICA) 150 MG capsule Take 1 capsule (150 mg total) by mouth 2 (two) times daily. 90 capsule 5 08/28/2022 at 1000   ranolazine (RANEXA) 1000 MG SR tablet Take 1 tablet (1,000 mg total) by mouth 2 (two) times daily. 180 tablet 1 08/28/2022 at 1000   sertraline (ZOLOFT) 100 MG tablet  Take 1.5 tablets (150 mg total) by mouth at bedtime. 135 tablet 1 08/27/2022 at 2200   traZODone (DESYREL) 50 MG tablet Take 1 tablet (50 mg total) by mouth at bedtime as needed for sleep (May take 1/2 tab to 1 tablet for sleep, as needed.). 30 tablet 3 08/27/2022 at 2200   vitamin B-12 (CYANOCOBALAMIN) 1000 MCG tablet Take 1,000 mcg by mouth daily.   08/28/2022 at 0800  Vitamin D, Ergocalciferol, (DRISDOL) 1.25 MG (50000 UNIT) CAPS capsule Take 1 capsule (50,000 Units total) by mouth every 7 (seven) days. 8 capsule 0 More than a month   XARELTO 20 MG TABS tablet TAKE 1 TABLET BY MOUTH DAILY WITH SUPPER 30 tablet 5 08/27/2022 at 1700   zolpidem (AMBIEN) 5 MG tablet Take 1 tablet (5 mg total) by mouth at bedtime as needed for sleep. 30 tablet 5 08/27/2022 at 2200   LORazepam (ATIVAN) 0.5 MG tablet Take one tablet by mouth only for severe anxiety or agitation (Patient not taking: Reported on 08/29/2022) 30 tablet 1 Not Taking   Scheduled:   atorvastatin  80 mg Oral Daily   buPROPion  300 mg Oral Daily   busPIRone  15 mg Oral TID   finasteride  5 mg Oral Daily   ipratropium-albuterol  3 mL Nebulization BID   lamoTRIgine  25 mg Oral Daily   Followed by   Melene Muller ON 09/05/2022] lamoTRIgine  50 mg Oral Daily   pantoprazole  40 mg Oral Daily   Pirfenidone  801 mg Oral TID   predniSONE  40 mg Oral Q breakfast   pregabalin  150 mg Oral BID   ranolazine  1,000 mg Oral BID   sodium chloride flush  3 mL Intravenous Q12H   Infusions:   sodium chloride 10 mL/hr at 08/29/22 0337   sodium chloride 50 mL/hr at 08/30/22 0120   heparin 1,850 Units/hr (08/30/22 0714)   piperacillin-tazobactam (ZOSYN)  IV 3.375 g (08/30/22 0615)    Assessment: 72yo male c/o SOB x2-3wk, CT reveals small subsegmental PE despite Xarelto therapy PTA for PAF. Last dose of Xarelto reported to be taken 6/7 at 1700. From review of pharmacy records, it appears good compliance with Xarelto.  Pharmacy consulted to transition to heparin.  Aptt  at goal this morning (77), heparin level still falsely elevated from recent Xarelto.  No s/sx of bleeding or infusion issues per nursing.  Goal of Therapy:  Heparin level 0.3-0.7 units/ml aPTT 66-102 seconds Monitor platelets by anticoagulation protocol: Yes   Plan:  Continue IV heparin at current rate of 1850 units/hr. Monitor heparin levels, aPTT (while DOAC affectings anti-Xa assay), and CBC. F/u plans for ongoing anticoagulation.  Thank you for allowing pharmacy to participate in this patient's care,  Jenetta Downer, [facility] Clinical Pharmacist  08/30/2022 10:55 AM   [facility] pharmacy phone numbers are listed on [email or web address]

## 2022-08-30 NOTE — Telephone Encounter (Signed)
Pt wife called in to cancel appointments due to Pt is in the hospital and has a Bloodclot on his lung

## 2022-08-30 NOTE — Progress Notes (Signed)
ANTICOAGULATION CONSULT NOTE  Pharmacy Consult for heparin Indication:  acute PE despite Xarelto PTA for PAF  Allergies  Allergen Reactions   Naproxen Other (See Comments)    (Naprosyn *ANALGESICS - ANTI-INFLAMMATORY*) Nausea, Abdominal pain   Silodosin Other (See Comments)    Low blood pressure    Patient Measurements: Height: 5\' 11"  (180.3 cm) Weight: 87.9 kg (193 lb 12.8 oz) IBW/kg (Calculated) : 75.3  Vital Signs: Temp: 98 F (36.7 C) (06/10 1144) Temp Source: Oral (06/10 1144) BP: 156/99 (06/10 1653) Pulse Rate: 82 (06/10 1653)  Labs: Recent Labs    08/28/22 1717 08/28/22 1727 08/29/22 0251 08/29/22 1140 08/29/22 2106 08/30/22 0632 08/30/22 1205  HGB 13.2 14.6 13.8  --   --  15.0  --   HCT 40.9 43.0 42.0  --   --  47.4  --   PLT 209  --  221  --   --  287  --   APTT  --   --   --  56* 60* 77*  --   LABPROT  --   --   --   --   --   --  14.5  INR  --   --   --   --   --   --  1.1  HEPARINUNFRC  --   --   --  0.65  --  0.92*  --   CREATININE 0.80  --  0.87  --   --  0.92  --      Estimated Creatinine Clearance: 77.3 mL/min (by C-G formula based on SCr of 0.92 mg/dL).   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 M75.00)   Allergic rhinitis due to pollen 09/03/2013   J30.1)  Formatting of this note might be different from the original. J30.1)   Allergy    Anticoagulated 07/01/2016   Anxiety    Arthritis    Atrial fibrillation (HCC)    Atrial flutter (HCC) 06/26/2015   CAD (coronary artery disease)    Chest pain 06/26/2015   COPD (chronic obstructive pulmonary disease) (HCC)    Coronary artery disease 07/06/2018   Cardiac catheterization 2017 showing 90% small diagonal branch disease   DDD (degenerative disc disease), cervical  09/03/2013   M50.90)  Formatting of this note might be different from the original. M50.90)   Depression    Depression    Depressive disorder 09/03/2013   Dizziness 10/28/2017   Dyslipidemia, goal LDL below 70 07/06/2018   Dyspnea on exertion 10/20/2018   Esophageal reflux 09/03/2013   Essential hypertension 07/06/2018   ETOH abuse 01/11/2019   6 pack of beer per day   Falls 10/28/2017   GERD (gastroesophageal reflux disease)    H/O amiodarone therapy 07/01/2016   Heart attack (HCC)    Heart palpitations 01/27/2017   History of colon polyps    Hyperlipidemia    Hypertension    IPF (idiopathic pulmonary fibrosis) (HCC)    Kidney stones    Neck pain 09/03/2013   Neuropathy 07/06/2018   Obstructive sleep apnea 08/05/2015   PLMD (periodic limb movement disorder) 11/04/2015   Polycythemia, 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 STORY: Due to alcohol/ tobacco   Pure hypercholesterolemia 09/03/2013  E78.0)  Formatting of this note might be different from the original. E78.0)   Screening for prostate cancer 09/03/2013   Smoker 01/11/2019   Smoking 07/06/2018   Status post ablation of atrial flutter 07/06/2018   2017    Assessment: 72yo male c/o SOB x2-3wk, CT reveals small subsegmental PE despite Xarelto therapy PTA for PAF. Last dose of Xarelto reported to be taken 6/7 at 1700. From review of pharmacy records, it appears good compliance with Xarelto.  Pharmacy consulted to transition to heparin.  Aptt at goal this morning (77), heparin level still falsely elevated from recent Xarelto.  No s/sx of bleeding or infusion issues per nursing.  Heparin off since ~1200 for perc drain placement. Now s/p placement and Dr. Loreta Ave said ok to restart heparin - will restart at 1850 units/hr.  Goal of Therapy:  Heparin level 0.3-0.7 units/ml aPTT 66-102 seconds Monitor platelets by anticoagulation protocol: Yes    Plan:  Restart IV heparin at 1850 units/hr. Will f/u aPTT and heparin level in 8 hours F/u ability to transition back to DOAC  Thank you for allowing pharmacy to participate in this patient's care,  Christoper Fabian, PharmD, BCPS Please see amion for complete clinical pharmacist phone list  08/30/2022 5:13 PM

## 2022-08-30 NOTE — Evaluation (Signed)
Physical Therapy Evaluation Patient Details Name: Robert Lang MRN: [id number] DOB: 1950/05/18 Today's Date: 08/30/2022  History of Present Illness  Patient is 72 y.o. male presented for SOB and fatigue x2-3 weeks. CT revealed small subsegmental PE and cholecystitis. PMH significant of hypertension, hyperlipidemia, atrial fibrillation on anticoagulation, COPD, idiopathic pulmonary fibrosis, periodic limb movement disorder, anxiety, and OSA.   Clinical Impression  Robert Lang is 72 y.o. male admitted with above HPI and diagnosis. Patient is currently limited by functional impairments below (see PT problem list). Patient lives with spouse and is mod independent with rollator for household mobility and scooter for long distances at baseline. Currently pt requires min guard/assist for transfers and gait with RW and is limited by SOB with activity. SpO2 stable on 2L/min and remained >92%. Patient will benefit from continued skilled PT interventions to address impairments and progress independence with mobility. Acute PT will follow and progress as able.        Recommendations for follow up therapy are one component of a multi-disciplinary discharge planning process, led by the attending physician.  Recommendations may be updated based on patient status, additional functional criteria and insurance authorization.  Follow Up Recommendations       Assistance Recommended at Discharge Frequent or constant Supervision/Assistance  Patient can return home with the following  A little help with walking and/or transfers;A little help with bathing/dressing/bathroom;Assistance with cooking/housework;Assist for transportation;Help with stairs or ramp for entrance;Direct supervision/assist for medications management    Equipment Recommendations None recommended by PT  Recommendations for Other Services       Functional Status Assessment Patient has had a recent decline in their functional status and  demonstrates the ability to make significant improvements in function in a reasonable and predictable amount of time.     Precautions / Restrictions Precautions Precautions: Fall Precaution Comments: reports 2 falls in last 6 months maybe 3 falls. Restrictions Weight Bearing Restrictions: No      Mobility  Bed Mobility               General bed mobility comments: pt OOB in recliner at start    Transfers Overall transfer level: Needs assistance Equipment used: Rolling walker (2 wheels) Transfers: Sit to/from Stand Sit to Stand: Min guard           General transfer comment: cues for safety as pt is quick to stand and sit.    Ambulation/Gait Ambulation/Gait assistance: Min assist, Min guard Gait Distance (Feet): 110 Feet Assistive device: Rolling walker (2 wheels) Gait Pattern/deviations: Step-through pattern, Decreased step length - right, Decreased step length - left, Decreased stride length, Shuffle, Trunk flexed, Staggering left, Drifts right/left, Knee flexed in stance - left Gait velocity: decr     General Gait Details: pt with slight drift to Lt and Lt knee flexed in stance. min guard with RW fading to min assist as pt fatigued. Assist needed for turn and ceus for standing rest break.  Stairs            Wheelchair Mobility    Modified Rankin (Stroke Patients Only)       Balance Overall balance assessment: Needs assistance Sitting-balance support: Feet supported Sitting balance-Leahy Scale: Good     Standing balance support: During functional activity, Bilateral upper extremity supported, Reliant on assistive device for balance Standing balance-Leahy Scale: Poor  Pertinent Vitals/Pain Pain Assessment Pain Assessment: No/denies pain    Home Living Family/patient expects to be discharged to:: Private residence Living Arrangements: Spouse/significant other Available Help at Discharge: Family Type  of Home: House Home Access: Level entry Entrance Stairs-Rails: None     Home Layout: Two level;Able to live on main level with bedroom/bathroom Home Equipment: Educational psychologist (4 wheels);Electric scooter;Cane - single point Additional Comments: scooter for [place] distances like shopping in mall or store or when on vacation.    Prior Function Prior Level of Function : Independent/Modified Independent             Mobility Comments: pt has been using a walking stick for last 2-3 weeks and prior to that was not using anything in home. uses rollator for short ambulation around the block or to the mailbox. for longer distances in community he has an Art gallery manager. ADLs Comments: independent with dressing showering, pt states he can cook meals but needs to sit for breaks and prep work, his wife functions as the head chef most of the time but if he cooks she is his Biomedical scientist.     Hand Dominance   Dominant Hand: Right    Extremity/Trunk Assessment   Upper Extremity Assessment Upper Extremity Assessment: Overall WFL for tasks assessed    Lower Extremity Assessment Lower Extremity Assessment: Defer to PT evaluation RLE Deficits / Details: grossly 3+/5 to 4-/5 with MMT: hip flex, abd/add; knee flex/ext; DF/PF RLE Sensation: history of peripheral neuropathy RLE Coordination: decreased gross motor LLE Deficits / Details: grossly 4/5 to 4+/5 with MMT: hip flex, abd/add; knee flex/ext; DF/PF LLE Sensation: history of peripheral neuropathy LLE Coordination: WNL    Cervical / Trunk Assessment Cervical / Trunk Assessment: Normal  Communication   Communication: No difficulties  Cognition Arousal/Alertness: Awake/alert Behavior During Therapy: WFL for tasks assessed/performed Overall Cognitive Status: Within Functional Limits for tasks assessed                                          General Comments General comments (skin integrity, edema, etc.): VSS  on RA    Exercises     Assessment/Plan    PT Assessment Patient needs continued PT services  PT Problem List Decreased strength;Decreased activity tolerance;Decreased balance;Decreased mobility;Decreased knowledge of use of DME;Decreased safety awareness;Cardiopulmonary status limiting activity       PT Treatment Interventions DME instruction;Gait training;Stair training;Functional mobility training;Therapeutic activities;Therapeutic exercise;Balance training;Patient/family education    PT Goals (Current goals can be found in the Care Plan section)  Acute Rehab PT Goals Patient Stated Goal: stop feeling SOB and improve endurance PT Goal Formulation: With patient Time For Goal Achievement: 09/13/22 Potential to Achieve Goals: Good    Frequency Min 1X/week     Co-evaluation               AM-PAC PT "6 Clicks" Mobility  Outcome Measure Help needed turning from your back to your side while in a flat bed without using bedrails?: A Little Help needed moving from lying on your back to sitting on the side of a flat bed without using bedrails?: A Little Help needed moving to and from a bed to a chair (including a wheelchair)?: A Little Help needed standing up from a chair using your arms (e.g., wheelchair or bedside chair)?: A Little Help needed to walk in hospital room?: A Little Help needed  climbing 3-5 steps with a railing? : A Lot 6 Click Score: 17    End of Session Equipment Utilized During Treatment: Gait belt Activity Tolerance: Patient tolerated treatment well Patient left: in chair;with call bell/phone within reach;with chair alarm set;with family/visitor present Nurse Communication: Mobility status PT Visit Diagnosis: Muscle weakness (generalized) (M62.81);Difficulty in walking, not elsewhere classified (R26.2);Other abnormalities of gait and mobility (R26.89)    Time: 4098-1191 PT Time Calculation (min) (ACUTE ONLY): 37 min   Charges:   PT Evaluation $PT Eval  Moderate Complexity: 1 Mod PT Treatments $Gait Training: 8-22 mins        Wynn Maudlin, DPT Acute Rehabilitation Services Office [phone]  08/30/22 1:58 PM

## 2022-08-30 NOTE — Procedures (Addendum)
Interventional Radiology Procedure Note  Procedure: Image guided drain placement, perc chole.  64F pigtail drain.  Complications: None  EBL: None Sample: Culture sent  Recommendations: - Routine drain care, with sterile flushes, record output - follow up Cx - routine wound care - OK to advance diet per primary order - OK to restart hep ggt now  Signed,  Yvone Neu. Loreta Ave, DO

## 2022-08-30 NOTE — Progress Notes (Signed)
PROGRESS NOTE    Robert Lang  ZOX:[id number] DOB: [birth year] DOA: 08/28/2022 PCP: [name], [name]  Outpatient Specialists:     Brief Narrative:  Patient is a 72 year old male with past medical history significant for COPD, IPF, OSA, depression, anxiety, coronary artery disease, paroxysmal atrial fibrillation on Xarelto.  Patient was admitted with worsening shortness of breath and fatigue.  Worsening shortness of breath improved with DuoNeb and steroids.  CT of the chest done as part of patient's workup revealed a small pulmonary embolism.  Xarelto is currently on hold.  Patient is currently on heparin drip.  CT scan of the chest also revealed possible cholecystitis.  Right upper quadrant ultrasound is also suggestive of cholecystitis.  Surgical team has been consulted.  Will change IV cefepime to IV Zosyn.  08/29/2022: Patient seen.  On further questioning, patient tells me that he developed fever, chills, nausea, vomiting and right upper quadrant discomfort about 5 days prior to presentation.  Imaging studies done so far suggests likely cholecystitis.  Surgical team is directing further management.  08/30/2022: Patient seen alongside patient's wife.  Right upper quadrant pain/tenderness persist.  Surgical team input is appreciated.  For possible cholecystostomy.  Interventional radiology team has also been consulted.  Continue antibiotics.  Continue on heparin drip for now.   Assessment & Plan:   Principal Problem:   Acute respiratory failure with hypoxemia (HCC) Active Problems:   Coronary artery disease   COPD (chronic obstructive pulmonary disease) (HCC)   Depressive disorder   CAD (coronary artery disease)   Atrial fibrillation (HCC)   Anxiety   IPF (idiopathic pulmonary fibrosis) (HCC)   OSA (obstructive sleep apnea)   COPD with exacerbation (HCC)   1. Acute hypoxic respiratory failure; COPD; IPF; OSA  -Suspect secondary to COPD exacerbation. -Improved with DuoNebs and  steroids. -CTA chest revealed small pulmonary embolism. -Xarelto has been held for now. -Patient is not on heparin drip.   -CT chest also revealed likely cholecystitis.    2. CAD  - No anginal complaints  - Continue Lipitor, Ranexa     3. PAF  -Xarelto is on hold. -Patient is currently on heparin drip.      4. Depression, anxiety   - Continue Wellbutrin, Buspar, Zoloft, trazodone    Acute cholecystitis: -Recent history of fever, chills, nausea and vomiting in the last 1 week. -CTA chest revealed likely cholecystitis. -Right upper quadrant ultrasound also revealed likely cholecystitis. -IV cefepime has been changed to IV Zosyn. -Surgical team has been consulted.  Surgery team is directing care.  08/30/2022: For cholecystostomy by the interventional radiology team.  Continue IV antibiotics.  For interval cholecystectomy.   DVT prophylaxis: Heparin drip. Code Status: Full code. Family Communication: Wife. Disposition Plan: Home eventually   Consultants:  Surgery team.  Procedures:  None for now. Cholecystostomy is planned.  Antimicrobials:  IV cefepime has been discontinued. IV Zosyn.   Subjective: No shortness of breath. No fever or chills.   Objective: Vitals:   08/30/22 1640 08/30/22 1644 08/30/22 1645 08/30/22 1653  BP: (!) 155/87  (!) 142/77 (!) 156/99  Pulse: 83  83 82  Resp: 18  (!) 22 (!) 22  Temp:      TempSrc:      SpO2: 92% 90% 91% 92%  Weight:      Height:        Intake/Output Summary (Last 24 hours) at 08/30/2022 1701 Last data filed at 08/30/2022 1524 Gross per 24 hour  Intake 1914.23 ml  Output  2350 ml  Net -435.77 ml    Filed Weights   08/28/22 1622 08/29/22 0014 08/30/22 0352  Weight: 90.7 kg 89.3 kg 87.9 kg    Examination:  General exam: Appears calm and comfortable.  Patient is obese. Respiratory system: Decreased air entry. Cardiovascular system: S1 & S2 heard Gastrointestinal system: Abdomen is obese.  Right upper quadrant  tenderness on palpation.  Positive Murphy sign. Central nervous system: Alert and oriented.  Patient moves all extremities. Extremities: No leg edema.  Data Reviewed: I have personally reviewed following labs and imaging studies  CBC: Recent Labs  Lab 08/28/22 1717 08/28/22 1727 08/29/22 0251 08/30/22 0632  WBC 12.1*  --  11.8* 12.0*  NEUTROABS 10.6*  --   --   --   HGB 13.2 14.6 13.8 15.0  HCT 40.9 43.0 42.0 47.4  MCV 95.6  --  96.3 98.8  PLT 209  --  221 287    Basic Metabolic Panel: Recent Labs  Lab 08/28/22 1717 08/28/22 1727 08/29/22 0251 08/30/22 0632  NA 133* 136 134* 137  K 4.4 4.4 4.1 4.5  CL 102  --  99 101  CO2 21*  --  21* 25  GLUCOSE 181*  --  183* 163*  BUN 14  --  13 15  CREATININE 0.80  --  0.87 0.92  CALCIUM 8.5*  --  9.2 9.1  MG  --   --  2.2  --     GFR: Estimated Creatinine Clearance: 77.3 mL/min (by C-G formula based on SCr of 0.92 mg/dL). Liver Function Tests: Recent Labs  Lab 08/29/22 0251  AST 31  ALT 30  ALKPHOS 70  BILITOT 0.4  PROT 7.2  ALBUMIN 2.8*    Recent Labs  Lab 08/29/22 0251  LIPASE 47    No results for input(s): "AMMONIA" in the last 168 hours. Coagulation Profile: Recent Labs  Lab 08/30/22 1205  INR 1.1   Cardiac Enzymes: No results for input(s): "CKTOTAL", "CKMB", "CKMBINDEX", "TROPONINI" in the last 168 hours. BNP (last 3 results) No results for input(s): "PROBNP" in the last 8760 hours. HbA1C: No results for input(s): "HGBA1C" in the last 72 hours. CBG: No results for input(s): "GLUCAP" in the last 168 hours. Lipid Profile: No results for input(s): "CHOL", "HDL", "LDLCALC", "TRIG", "CHOLHDL", "LDLDIRECT" in the last 72 hours. Thyroid Function Tests: No results for input(s): "TSH", "T4TOTAL", "FREET4", "T3FREE", "THYROIDAB" in the last 72 hours. Anemia Panel: No results for input(s): "VITAMINB12", "FOLATE", "FERRITIN", "TIBC", "IRON", "RETICCTPCT" in the last 72 hours. Urine analysis:    Component  Value Date/Time   COLORURINE YELLOW 08/27/2021 0245   APPEARANCEUR CLEAR 08/27/2021 0245   LABSPEC 1.014 08/27/2021 0245   PHURINE 7.0 08/27/2021 0245   GLUCOSEU NEGATIVE 08/27/2021 0245   HGBUR NEGATIVE 08/27/2021 0245   BILIRUBINUR NEGATIVE 08/27/2021 0245   BILIRUBINUR Negative 09/06/2018 1023   KETONESUR NEGATIVE 08/27/2021 0245   PROTEINUR NEGATIVE 08/27/2021 0245   UROBILINOGEN negative (A) 09/06/2018 1023   NITRITE NEGATIVE 08/27/2021 0245   LEUKOCYTESUR NEGATIVE 08/27/2021 0245   Sepsis Labs: @LABRCNTIP (procalcitonin:4,lacticidven:4)  ) Recent Results (from the past 240 hour(s))  Expectorated Sputum Assessment w Gram Stain, Rflx to Resp Cult     Status: None   Collection Time: 08/29/22  1:00 PM   Specimen: Expectorated Sputum  Result Value Ref Range Status   Specimen Description EXPECTORATED SPUTUM  Final   Special Requests NONE  Final   Sputum evaluation   Final    THIS SPECIMEN IS  ACCEPTABLE FOR SPUTUM CULTURE Performed at [facility], [address] [place], [place] [address]    Report Status 08/29/2022 FINAL  Final  Culture, Respiratory w Gram Stain     Status: None (Preliminary result)   Collection Time: 08/29/22  1:00 PM  Result Value Ref Range Status   Specimen Description EXPECTORATED SPUTUM  Final   Special Requests NONE Reflexed from [id number]  Final   Gram Stain   Final    FEW SQUAMOUS EPITHELIAL CELLS PRESENT MODERATE WBC PRESENT, PREDOMINANTLY PMN FEW GRAM POSITIVE COCCI FEW GRAM NEGATIVE RODS FEW GRAM NEGATIVE DIPLOCOCCI FEW GRAM POSITIVE RODS    Culture   Final    CULTURE REINCUBATED FOR BETTER GROWTH Performed at [facility], [address], [place], [place] [address]    Report Status PENDING  Incomplete         Radiology Studies: ECHOCARDIOGRAM COMPLETE  Result Date: 08/29/2022    ECHOCARDIOGRAM REPORT   Patient Name:   AMARIO CLUCAS Date of Exam: 08/29/2022 Medical Rec #:  [id number]         Height:       71.0 in  Accession #:    [id number]        Weight:       196.8 lb Date of Birth:  1950/08/18          BSA:          2.094 m Patient Age:    72 years          BP:           163/87 mmHg Patient Gender: M                 HR:           97 bpm. Exam Location:  Inpatient Procedure: 2D Echo, Cardiac Doppler and Color Doppler Indications:    Pulmonary Embolus I26.09  History:        Patient has prior history of Echocardiogram examinations, most                 recent 05/19/2022. CAD and Previous Myocardial Infarction, COPD                 and ETOH abuse; Risk Factors:Sleep Apnea, Dyslipidemia, Former                 Smoker and Hypertension.  Sonographer:    Dondra Prader RVT RCS Referring Phys: [id number] TIMOTHY S OPYD  Sonographer Comments: Suboptimal apical window, Technically challenging study due to limited acoustic windows and Technically difficult study due to poor echo windows. Declined Definity IMPRESSIONS  1. Left ventricular ejection fraction, by estimation, is 60 to 65%. The left ventricle has normal function. Left ventricular endocardial border not optimally defined to evaluate regional wall motion. Left ventricular diastolic parameters are indeterminate.  2. Right ventricular systolic function is normal. The right ventricular size is normal. Tricuspid regurgitation signal is inadequate for assessing PA pressure.  3. The mitral valve is normal in structure. No evidence of mitral valve regurgitation. No evidence of mitral stenosis.  4. The aortic valve is tricuspid. There is mild calcification of the aortic valve. There is mild thickening of the aortic valve. Aortic valve regurgitation is not visualized. No aortic stenosis is present.  5. The inferior vena cava is normal in size with greater than 50% respiratory variability, suggesting right atrial pressure of 3 mmHg. FINDINGS  Left Ventricle: Left ventricular ejection fraction, by estimation, is  60 to 65%. The left ventricle has normal function. Left ventricular endocardial  border not optimally defined to evaluate regional wall motion. The left ventricular internal cavity size was normal in size. There is no left ventricular hypertrophy. Left ventricular diastolic parameters are indeterminate. Right Ventricle: The right ventricular size is normal. Right vetricular wall thickness was not well visualized. Right ventricular systolic function is normal. Tricuspid regurgitation signal is inadequate for assessing PA pressure. Left Atrium: Left atrial size was normal in size. Right Atrium: Right atrial size was normal in size. Pericardium: There is no evidence of pericardial effusion. Mitral Valve: The mitral valve is normal in structure. No evidence of mitral valve regurgitation. No evidence of mitral valve stenosis. Tricuspid Valve: The tricuspid valve is normal in structure. Tricuspid valve regurgitation is not demonstrated. No evidence of tricuspid stenosis. Aortic Valve: The aortic valve is tricuspid. There is mild calcification of the aortic valve. There is mild thickening of the aortic valve. There is mild aortic valve annular calcification. Aortic valve regurgitation is not visualized. No aortic stenosis  is present. Aortic valve mean gradient measures 3.0 mmHg. Aortic valve peak gradient measures 6.0 mmHg. Aortic valve area, by VTI measures 2.84 cm. Pulmonic Valve: The pulmonic valve was not well visualized. Pulmonic valve regurgitation is not visualized. No evidence of pulmonic stenosis. Aorta: The aortic root is normal in size and structure. Venous: The inferior vena cava is normal in size with greater than 50% respiratory variability, suggesting right atrial pressure of 3 mmHg. IAS/Shunts: No atrial level shunt detected by color flow Doppler.  LEFT VENTRICLE PLAX 2D LVIDd:         5.00 cm   Diastology LVIDs:         3.50 cm   LV e' medial:    6.96 cm/s LV PW:         1.00 cm   LV E/e' medial:  14.2 LV IVS:        1.00 cm   LV e' lateral:   10.40 cm/s LVOT diam:     2.00 cm   LV  E/e' lateral: 9.5 LV SV:         58 LV SV Index:   28 LVOT Area:     3.14 cm  RIGHT VENTRICLE             IVC RV Basal diam:  3.50 cm     IVC diam: 1.60 cm RV S prime:     15.20 cm/s TAPSE (M-mode): 2.0 cm LEFT ATRIUM             Index        RIGHT ATRIUM           Index LA diam:        4.30 cm 2.05 cm/m   RA Area:     11.90 cm LA Vol (A2C):   56.3 ml 26.88 ml/m  RA Volume:   24.50 ml  11.70 ml/m LA Vol (A4C):   40.0 ml 19.10 ml/m LA Biplane Vol: 46.2 ml 22.06 ml/m  AORTIC VALVE                    PULMONIC VALVE AV Area (Vmax):    2.65 cm     PV Vmax:       1.50 m/s AV Area (Vmean):   2.72 cm     PV Peak grad:  9.0 mmHg AV Area (VTI):     2.84 cm AV Vmax:  122.00 cm/s AV Vmean:          78.400 cm/s AV VTI:            0.206 m AV Peak Grad:      6.0 mmHg AV Mean Grad:      3.0 mmHg LVOT Vmax:         103.00 cm/s LVOT Vmean:        67.900 cm/s LVOT VTI:          0.186 m LVOT/AV VTI ratio: 0.90  AORTA Ao Root diam: 3.30 cm Ao Asc diam:  3.00 cm MITRAL VALVE MV Area (PHT): 5.13 cm    SHUNTS MV Decel Time: 148 msec    Systemic VTI:  0.19 m MV E velocity: 98.50 cm/s  Systemic Diam: 2.00 cm Dina Rich MD Electronically signed by Dina Rich MD Signature Date/Time: 08/29/2022/4:14:46 PM    Final    US Abdomen Limited RUQ (LIVER/GB)  Result Date: 08/29/2022 CLINICAL DATA:  Cholelithiasis EXAM: ULTRASOUND ABDOMEN LIMITED RIGHT UPPER QUADRANT COMPARISON:  CT from earlier in the same day, ultrasound from 05/13/2021. FINDINGS: Gallbladder: Gallbladder is well distended. Positive sonographic Murphy's sign is noted. Wall thickening to 4.5 mm is seen. The dependent density seen on prior CT is not well appreciated on this exam. Common bile duct: Diameter: 3 mm Liver: 1.5 cm echogenic focus is noted in the right lobe of the liver consistent with hemangioma. This is stable from prior ultrasound. No other focal abnormality is noted portal vein is patent on color Doppler imaging with normal direction of  blood flow towards the liver. Other: None. IMPRESSION: Stable hepatic hemangioma. Gallbladder wall thickening with positive sonographic Murphy's sign. These changes are consistent with cholecystitis in the appropriate clinical setting. The dependent density seen on prior CT is not well appreciated on this exam. Electronically Signed   By: Alcide Clever M.D.   On: 08/29/2022 10:15   CT Angio Chest Pulmonary Embolism (PE) W or WO Contrast  Result Date: 08/29/2022 CLINICAL DATA:  Pulmonary embolism suspected, high probability. EXAM: CT ANGIOGRAPHY CHEST WITH CONTRAST TECHNIQUE: Multidetector CT imaging of the chest was performed using the standard protocol during bolus administration of intravenous contrast. Multiplanar CT image reconstructions and MIPs were obtained to evaluate the vascular anatomy. RADIATION DOSE REDUCTION: This exam was performed according to the departmental dose-optimization program which includes automated exposure control, adjustment of the mA and/or kV according to patient size and/or use of iterative reconstruction technique. CONTRAST:  75mL OMNIPAQUE IOHEXOL 350 MG/ML SOLN COMPARISON:  08/25/2021. FINDINGS: Cardiovascular: The heart is enlarged and there is a trace pericardial effusion. Three-vessel coronary artery calcifications are noted. There is atherosclerotic calcification of the aorta without evidence of aneurysm. The pulmonary trunk is normal in caliber. Small pulmonary artery filling defects are noted in the subsegmental arteries in the left lower lobe. Evaluation is slightly limited due to respiratory motion. Dilatation of the right ventricle is noted. Mediastinum/Nodes: No enlarged mediastinal, hilar, or axillary lymph nodes. Thyroid gland, trachea, and esophagus demonstrate no significant findings. Lungs/Pleura: There is a trace right pleural effusion with atelectasis at the lung bases. No pneumothorax. Upper Abdomen: Hyperdense material is present in the dependent portion of  the urinary bladder, possible stones or sludge. There is gallbladder wall thickening with pericholecystic fat stranding. Musculoskeletal: Degenerative changes are present in the thoracic spine. Kyphoplasty changes are present at T12. No acute osseous abnormality. Review of the MIP images confirms the above findings. IMPRESSION: 1. Small subsegmental pulmonary emboli in  the left lower lobe. There is dilatation of the right ventricle which is unchanged from the prior exam and may be related to right heart failure given small thrombus volume. Correlate clinically to exclude superimposed strain. 2. Trace right pleural effusion with atelectasis at the lung bases bilaterally. 3. Layering hyperdense material in the gallbladder, possible stones or sludge, with gallbladder wall thickening and surrounding fat stranding, concerning for cholecystitis. Ultrasound is recommended for further evaluation. 4. Three-vessel coronary artery calcifications. 5. Aortic atherosclerosis. Critical Value/emergent results were called by telephone at the time of interpretation on 08/29/2022 at 3:44 am to provider TIMOTHY OPYD , who verbally acknowledged these results. Electronically Signed   By: Thornell Sartorius M.D.   On: 08/29/2022 03:44        Scheduled Meds:  atorvastatin  80 mg Oral Daily   buPROPion  300 mg Oral Daily   busPIRone  15 mg Oral TID   finasteride  5 mg Oral Daily   ipratropium-albuterol  3 mL Nebulization BID   lamoTRIgine  25 mg Oral Daily   Followed by   Melene Muller ON 09/05/2022] lamoTRIgine  50 mg Oral Daily   pantoprazole  40 mg Oral Daily   Pirfenidone  801 mg Oral TID   predniSONE  40 mg Oral Q breakfast   pregabalin  150 mg Oral BID   ranolazine  1,000 mg Oral BID   sodium chloride flush  3 mL Intravenous Q12H   Continuous Infusions:  sodium chloride 10 mL/hr at 08/29/22 0337   sodium chloride 50 mL/hr at 08/30/22 0120   cefOXitin     heparin Stopped (08/30/22 1158)   piperacillin-tazobactam (ZOSYN)  IV  3.375 g (08/30/22 1334)     LOS: 1 day    Time spent: 35 minutes.    Berton Mount, MD  [facility] Pager #: [phone] 7PM-7AM contact night coverage as above

## 2022-08-30 NOTE — Sedation Documentation (Signed)
75ml removed from biliary tube

## 2022-08-30 NOTE — Consult Note (Signed)
Chief Complaint: Patient was seen in consultation today for acute cholecystitis  Referring Physician(s): Feliciana Rossetti, MD  Supervising Physician: Gilmer Mor  Patient Status: [facility] - In-pt  History of Present Illness: Robert Lang is a 72 y.o. male with complex PMH including STEMI in 2020, anxiety, atrial fibrillation, CAD, COPD, hypertension, alcohol abuse, GERD, idiopathic pulmonary fibrosis, obstructive sleep apnea, and secondary polycythemia being seen today in relation to acute cholecystitis. Patient presented to [facility] on 08/28/22 with shortness of breath. He was found to have an acute pulmonary embolism and has been on a heparin drip. Patient was also found to have imaging concerning for acute cholecystitis on 6/9, and felt to not be a surgical candidate at this time due to heparin treatment for acute PE. IR was consulted for image-guided percutaneous cholecystostomy drain placement.  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 M75.00)   Allergic rhinitis due to pollen 09/03/2013   J30.1)  Formatting of this note might be different from the original. J30.1)   Allergy    Anticoagulated 07/01/2016   Anxiety    Arthritis    Atrial fibrillation (HCC)    Atrial flutter (HCC) 06/26/2015   CAD (coronary artery disease)    Chest pain 06/26/2015   COPD (chronic obstructive pulmonary disease) (HCC)    Coronary artery disease 07/06/2018   Cardiac catheterization 2017 showing 90% small diagonal branch disease   DDD (degenerative disc disease), cervical 09/03/2013   M50.90)  Formatting of this note might be different from the original. M50.90)   Depression    Depression    Depressive disorder 09/03/2013   Dizziness 10/28/2017   Dyslipidemia, goal LDL below 70 07/06/2018    Dyspnea on exertion 10/20/2018   Esophageal reflux 09/03/2013   Essential hypertension 07/06/2018   ETOH abuse 01/11/2019   6 pack of beer per day   Falls 10/28/2017   GERD (gastroesophageal reflux disease)    H/O amiodarone therapy 07/01/2016   Heart attack (HCC)    Heart palpitations 01/27/2017   History of colon polyps    Hyperlipidemia    Hypertension    IPF (idiopathic pulmonary fibrosis) (HCC)    Kidney stones    Neck pain 09/03/2013   Neuropathy 07/06/2018   Obstructive sleep apnea 08/05/2015   PLMD (periodic limb movement disorder) 11/04/2015   Polycythemia, 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 STORY: Due to alcohol/ tobacco   Pure hypercholesterolemia 09/03/2013   E78.0)  Formatting of this note might be different from the original. E78.0)   Screening for prostate cancer 09/03/2013   Smoker 01/11/2019   Smoking 07/06/2018   Status post ablation of atrial flutter 07/06/2018   2017    Past Surgical History:  Procedure Laterality Date   ATRIAL FIBRILLATION ABLATION  10/2015   BACK SURGERY     CARDIOVERSION  2017   CATARACT EXTRACTION Bilateral 2017   March and April 2017   COLONOSCOPY  2018   CORONARY STENT PLACEMENT  2012   CORONARY/GRAFT ACUTE MI REVASCULARIZATION N/A 01/10/2019   Procedure: CORONARY/GRAFT ACUTE MI REVASCULARIZATION;  Surgeon: Marykay Lex, MD;  Location: [facility] INVASIVE CV LAB;  Service: Cardiovascular;  Laterality: N/A;   INGUINAL HERNIA REPAIR     over 20 years  ago   LAPAROSCOPIC APPENDECTOMY N/A 01/17/2021   Procedure: APPENDECTOMY LAPAROSCOPIC;  Surgeon: Stechschulte, Hyman Hopes, MD;  Location: [facility] OR;  Service: General;  Laterality: N/A;   LEFT HEART CATH AND CORONARY ANGIOGRAPHY N/A 01/10/2019   Procedure: LEFT HEART CATH AND CORONARY ANGIOGRAPHY;  Surgeon: Marykay Lex, MD;  Location: [facility] INVASIVE CV LAB;  Service: Cardiovascular;  Laterality: N/A;   LUMBAR  LAMINECTOMY Bilateral 04/05/2016   L2-L5    VENTRAL HERNIA REPAIR  2018    Allergies: Naproxen and Silodosin  Medications: Prior to Admission medications   Medication Sig Start Date End Date Taking? Authorizing Provider  atorvastatin (LIPITOR) 80 MG tablet Take 1 tablet (80 mg total) by mouth daily. 04/28/22  Yes Georgeanna Lea, MD  buPROPion (WELLBUTRIN XL) 300 MG 24 hr tablet Take 1 tablet (300 mg total) by mouth daily. 08/20/22  Yes White, Arlys John A, NP  busPIRone (BUSPAR) 15 MG tablet Take 1 tablet (15 mg total) by mouth 3 (three) times daily. 08/20/22  Yes Joan Flores, NP  Cholecalciferol 25 MCG (1000 UT) tablet Take 1,000 Units by mouth daily.    Yes [provider]  docusate sodium (COLACE) 100 MG capsule Take 100 mg by mouth daily.   Yes [provider]  famotidine (PEPCID) 40 MG tablet TAKE 1 TABLET(40 MG) BY MOUTH AT BEDTIME Patient taking differently: Take 40 mg by mouth at bedtime. 08/11/22  Yes Georgeanna Lea, MD  fexofenadine (ALLEGRA) 180 MG tablet Take 180 mg by mouth at bedtime.  04/09/08  Yes [provider]  finasteride (PROSCAR) 5 MG tablet Take 1 tablet (5 mg total) by mouth daily. 03/10/21  Yes Georgeanna Lea, MD  Ginger, Zingiber officinalis, (GINGER ROOT) 550 MG CAPS Take 550 mg by mouth daily.   Yes [provider]  lamoTRIgine (LAMICTAL) 25 MG tablet Take one tablet by mouth 25 mg daily for 14 days, then take two tablets 50 mg total daily. Patient taking differently: Take 25 mg by mouth daily. started 25mg  08/22/22 08/20/22  Yes White, Arlys John A, NP  Lidocaine 4 % LOTN Use as needed in the bilateral lower extremities Patient taking differently: Apply 1 application  topically as needed (LE pain). Use as needed in the bilateral lower extremities 03/01/22  Yes Camara, Amadou, MD  melatonin 5 MG TABS Take 10 mg by mouth at bedtime.   Yes [provider]  Multiple Vitamins-Minerals (PRESERVISION AREDS PO) Take 1 capsule  by mouth in the morning and at bedtime.   Yes [provider]  nitroGLYCERIN (NITROSTAT) 0.4 MG SL tablet Place 1 tablet (0.4 mg total) under the tongue every 5 (five) minutes as needed for chest pain. 03/10/21  Yes Georgeanna Lea, MD  pantoprazole (PROTONIX) 40 MG tablet TAKE 1 TABLET(40 MG) BY MOUTH DAILY Patient taking differently: Take 40 mg by mouth daily. 07/01/22  Yes Lynann Bologna, MD  Pirfenidone (ESBRIET) 801 MG TABS Take 1 tablet (801 mg total) by mouth 3 (three) times daily. 06/24/22  Yes Kalman Shan, MD  Polyethylene Glycol 3350 (MIRALAX PO) Take 17 g by mouth daily.   Yes [provider]  predniSONE (DELTASONE) 10 MG tablet 4 tabs for 2 days, then 3 tabs for 2 days, 2 tabs for 2 days, then 1 tab for 2 days, then stop 08/27/22  Yes Glenford Bayley, NP  pregabalin (LYRICA) 150 MG capsule Take 1 capsule (150 mg total) by mouth 2 (two) times daily. 07/15/22  Yes Windell Norfolk, MD  ranolazine (RANEXA) 1000 MG SR tablet Take 1 tablet (1,000 mg total) by mouth 2 (two) times daily. 05/27/22  Yes Georgeanna Lea, MD  sertraline (ZOLOFT) 100 MG tablet Take 1.5 tablets (150 mg total) by mouth at bedtime. 06/28/22  Yes Joan Flores, NP  traZODone (DESYREL) 50 MG tablet Take 1 tablet (50 mg total) by mouth at bedtime as needed for sleep (May take 1/2 tab to 1 tablet for sleep, as needed.). 06/28/22  Yes White, Watt Climes, NP  vitamin B-12 (CYANOCOBALAMIN) 1000 MCG tablet Take 1,000 mcg by mouth daily.   Yes [provider]  Vitamin D, Ergocalciferol, (DRISDOL) 1.25 MG (50000 UNIT) CAPS capsule Take 1 capsule (50,000 Units total) by mouth every 7 (seven) days. 05/19/22  Yes [name], [name], [name]  XARELTO 20 MG TABS tablet TAKE 1 TABLET BY MOUTH DAILY WITH SUPPER 08/23/22  Yes Georgeanna Lea, MD  zolpidem (AMBIEN) 5 MG tablet Take 1 tablet (5 mg total) by mouth at bedtime as needed for sleep. 06/15/22 12/12/22 Yes Windell Norfolk, MD  LORazepam (ATIVAN) 0.5 MG tablet  Take one tablet by mouth only for severe anxiety or agitation Patient not taking: Reported on 08/29/2022 08/20/22   Joan Flores, NP     Family History  Problem Relation Age of Onset   Anxiety disorder Mother    Alcohol abuse Father    Colon cancer Father    Depression Brother    Alcohol abuse Brother    Throat cancer Brother     Social History   Socioeconomic History   Marital status: Married    Spouse name: Not on file   Number of children: Not on file   Years of education: Not on file   Highest education level: Not on file  Occupational History   Not on file  Tobacco Use   Smoking status: Former    Packs/day: 1.00    Years: 30.00    Additional pack years: 0.00    Total pack years: 30.00    Types: Cigarettes    Quit date: 01/10/2019    Years since quitting: 3.6   Smokeless tobacco: Former  Building services engineer Use: Never used  Substance and Sexual Activity   Alcohol use: Yes    Comment: 8oz of wine a day   Drug use: Never   Sexual activity: Not on file  Other Topics Concern   Not on file  Social History Narrative   Not on file   Social Determinants of Health   Financial Resource Strain: Low Risk  (12/22/2021)   Overall Financial Resource Strain (CARDIA)    Difficulty of Paying Living Expenses: Not hard at all  Food Insecurity: No Food Insecurity (08/29/2022)   Hunger Vital Sign    Worried About Running Out of Food in the Last Year: Never true    Ran Out of Food in the Last Year: Never true  Transportation Needs: No Transportation Needs (08/29/2022)   PRAPARE - Administrator, Civil Service (Medical): No    Lack of Transportation (Non-Medical): No  Physical Activity: Inactive (12/22/2021)   Exercise Vital Sign    Days of Exercise per Week: 0 days    Minutes of Exercise per Session: 0 min  Stress: No Stress Concern Present (12/22/2021)   [employer] of Occupational Health - Occupational Stress Questionnaire    Feeling of Stress : Not at all   Social Connections: Moderately Integrated (12/22/2021)   Social Connection and Isolation Panel [  NHANES]    Frequency of Communication with Friends and Family: More than three times a week    Frequency of Social Gatherings with Friends and Family: More than three times a week    Attends Religious Services: More than 4 times per year    Active Member of [employer] or Organizations: No    Attends Banker Meetings: Never    Marital Status: Married    Code Status: Full code  Review of Systems: A 12 point ROS discussed and pertinent positives are indicated in the HPI above.  All other systems are negative.  Review of Systems  Constitutional:  Positive for chills. Negative for fever.  Respiratory:  Positive for shortness of breath. Negative for chest tightness.   Cardiovascular:  Negative for chest pain and leg swelling.  Gastrointestinal:  Positive for abdominal pain, nausea and vomiting. Negative for diarrhea.  Neurological:  Positive for dizziness. Negative for headaches.  Psychiatric/Behavioral:  Negative for confusion.     Vital Signs: BP 138/82 (BP Location: Right Arm)   Pulse 86   Temp 98.3 F (36.8 C) (Oral)   Resp 20   Ht 5\' 11"  (1.803 m)   Wt 193 lb 12.8 oz (87.9 kg)   SpO2 95%   BMI 27.03 kg/m   Advance Care Plan: The advanced care plan/surrogate decision maker was discussed at the time of visit and documented in the medical record.  Patient indicated his wife as Runner, broadcasting/film/video.   Physical Exam Vitals reviewed.  Constitutional:      General: He is not in acute distress.    Appearance: He is ill-appearing.  HENT:     Mouth/Throat:     Mouth: Mucous membranes are moist.  Cardiovascular:     Rate and Rhythm: Normal rate and regular rhythm.     Pulses: Normal pulses.     Heart sounds: Normal heart sounds.  Pulmonary:     Effort: Pulmonary effort is normal.     Comments: Decreased breath sounds in lower lung fields bilaterally Abdominal:      Palpations: Abdomen is soft.     Tenderness: There is abdominal tenderness. There is no guarding.     Comments: RUQ tenderness to palpation  Musculoskeletal:     Right lower leg: No edema.     Left lower leg: No edema.  Skin:    General: Skin is warm and dry.  Neurological:     Mental Status: He is alert and oriented to person, place, and time.  Psychiatric:        Mood and Affect: Mood normal.        Behavior: Behavior normal.        Thought Content: Thought content normal.        Judgment: Judgment normal.     Imaging: ECHOCARDIOGRAM COMPLETE  Result Date: 08/29/2022    ECHOCARDIOGRAM REPORT   Patient Name:   Robert Lang Date of Exam: 08/29/2022 Medical Rec #:  [id number]         Height:       71.0 in Accession #:    [id number]        Weight:       196.8 lb Date of Birth:  July 16, 1950          BSA:          2.094 m Patient Age:    72 years          BP:  163/87 mmHg Patient Gender: M                 HR:           97 bpm. Exam Location:  Inpatient Procedure: 2D Echo, Cardiac Doppler and Color Doppler Indications:    Pulmonary Embolus I26.09  History:        Patient has prior history of Echocardiogram examinations, most                 recent 05/19/2022. CAD and Previous Myocardial Infarction, COPD                 and ETOH abuse; Risk Factors:Sleep Apnea, Dyslipidemia, Former                 Smoker and Hypertension.  Sonographer:    Dondra Prader RVT RCS Referring Phys: [id number] TIMOTHY S OPYD  Sonographer Comments: Suboptimal apical window, Technically challenging study due to limited acoustic windows and Technically difficult study due to poor echo windows. Declined Definity IMPRESSIONS  1. Left ventricular ejection fraction, by estimation, is 60 to 65%. The left ventricle has normal function. Left ventricular endocardial border not optimally defined to evaluate regional wall motion. Left ventricular diastolic parameters are indeterminate.  2. Right ventricular systolic function is normal.  The right ventricular size is normal. Tricuspid regurgitation signal is inadequate for assessing PA pressure.  3. The mitral valve is normal in structure. No evidence of mitral valve regurgitation. No evidence of mitral stenosis.  4. The aortic valve is tricuspid. There is mild calcification of the aortic valve. There is mild thickening of the aortic valve. Aortic valve regurgitation is not visualized. No aortic stenosis is present.  5. The inferior vena cava is normal in size with greater than 50% respiratory variability, suggesting right atrial pressure of 3 mmHg. FINDINGS  Left Ventricle: Left ventricular ejection fraction, by estimation, is 60 to 65%. The left ventricle has normal function. Left ventricular endocardial border not optimally defined to evaluate regional wall motion. The left ventricular internal cavity size was normal in size. There is no left ventricular hypertrophy. Left ventricular diastolic parameters are indeterminate. Right Ventricle: The right ventricular size is normal. Right vetricular wall thickness was not well visualized. Right ventricular systolic function is normal. Tricuspid regurgitation signal is inadequate for assessing PA pressure. Left Atrium: Left atrial size was normal in size. Right Atrium: Right atrial size was normal in size. Pericardium: There is no evidence of pericardial effusion. Mitral Valve: The mitral valve is normal in structure. No evidence of mitral valve regurgitation. No evidence of mitral valve stenosis. Tricuspid Valve: The tricuspid valve is normal in structure. Tricuspid valve regurgitation is not demonstrated. No evidence of tricuspid stenosis. Aortic Valve: The aortic valve is tricuspid. There is mild calcification of the aortic valve. There is mild thickening of the aortic valve. There is mild aortic valve annular calcification. Aortic valve regurgitation is not visualized. No aortic stenosis  is present. Aortic valve mean gradient measures 3.0 mmHg.  Aortic valve peak gradient measures 6.0 mmHg. Aortic valve area, by VTI measures 2.84 cm. Pulmonic Valve: The pulmonic valve was not well visualized. Pulmonic valve regurgitation is not visualized. No evidence of pulmonic stenosis. Aorta: The aortic root is normal in size and structure. Venous: The inferior vena cava is normal in size with greater than 50% respiratory variability, suggesting right atrial pressure of 3 mmHg. IAS/Shunts: No atrial level shunt detected by color flow Doppler.  LEFT VENTRICLE PLAX 2D LVIDd:  5.00 cm   Diastology LVIDs:         3.50 cm   LV e' medial:    6.96 cm/s LV PW:         1.00 cm   LV E/e' medial:  14.2 LV IVS:        1.00 cm   LV e' lateral:   10.40 cm/s LVOT diam:     2.00 cm   LV E/e' lateral: 9.5 LV SV:         58 LV SV Index:   28 LVOT Area:     3.14 cm  RIGHT VENTRICLE             IVC RV Basal diam:  3.50 cm     IVC diam: 1.60 cm RV S prime:     15.20 cm/s TAPSE (M-mode): 2.0 cm LEFT ATRIUM             Index        RIGHT ATRIUM           Index LA diam:        4.30 cm 2.05 cm/m   RA Area:     11.90 cm LA Vol (A2C):   56.3 ml 26.88 ml/m  RA Volume:   24.50 ml  11.70 ml/m LA Vol (A4C):   40.0 ml 19.10 ml/m LA Biplane Vol: 46.2 ml 22.06 ml/m  AORTIC VALVE                    PULMONIC VALVE AV Area (Vmax):    2.65 cm     PV Vmax:       1.50 m/s AV Area (Vmean):   2.72 cm     PV Peak grad:  9.0 mmHg AV Area (VTI):     2.84 cm AV Vmax:           122.00 cm/s AV Vmean:          78.400 cm/s AV VTI:            0.206 m AV Peak Grad:      6.0 mmHg AV Mean Grad:      3.0 mmHg LVOT Vmax:         103.00 cm/s LVOT Vmean:        67.900 cm/s LVOT VTI:          0.186 m LVOT/AV VTI ratio: 0.90  AORTA Ao Root diam: 3.30 cm Ao Asc diam:  3.00 cm MITRAL VALVE MV Area (PHT): 5.13 cm    SHUNTS MV Decel Time: 148 msec    Systemic VTI:  0.19 m MV E velocity: 98.50 cm/s  Systemic Diam: 2.00 cm Dina Rich MD Electronically signed by Dina Rich MD Signature Date/Time:  08/29/2022/4:14:46 PM    Final    US Abdomen Limited RUQ (LIVER/GB)  Result Date: 08/29/2022 CLINICAL DATA:  Cholelithiasis EXAM: ULTRASOUND ABDOMEN LIMITED RIGHT UPPER QUADRANT COMPARISON:  CT from earlier in the same day, ultrasound from 05/13/2021. FINDINGS: Gallbladder: Gallbladder is well distended. Positive sonographic Murphy's sign is noted. Wall thickening to 4.5 mm is seen. The dependent density seen on prior CT is not well appreciated on this exam. Common bile duct: Diameter: 3 mm Liver: 1.5 cm echogenic focus is noted in the right lobe of the liver consistent with hemangioma. This is stable from prior ultrasound. No other focal abnormality is noted portal vein is patent on color Doppler imaging with normal direction of blood flow towards the liver. Other:  None. IMPRESSION: Stable hepatic hemangioma. Gallbladder wall thickening with positive sonographic Murphy's sign. These changes are consistent with cholecystitis in the appropriate clinical setting. The dependent density seen on prior CT is not well appreciated on this exam. Electronically Signed   By: Alcide Clever M.D.   On: 08/29/2022 10:15   CT Angio Chest Pulmonary Embolism (PE) W or WO Contrast  Result Date: 08/29/2022 CLINICAL DATA:  Pulmonary embolism suspected, high probability. EXAM: CT ANGIOGRAPHY CHEST WITH CONTRAST TECHNIQUE: Multidetector CT imaging of the chest was performed using the standard protocol during bolus administration of intravenous contrast. Multiplanar CT image reconstructions and MIPs were obtained to evaluate the vascular anatomy. RADIATION DOSE REDUCTION: This exam was performed according to the departmental dose-optimization program which includes automated exposure control, adjustment of the mA and/or kV according to patient size and/or use of iterative reconstruction technique. CONTRAST:  75mL OMNIPAQUE IOHEXOL 350 MG/ML SOLN COMPARISON:  08/25/2021. FINDINGS: Cardiovascular: The heart is enlarged and there is a  trace pericardial effusion. Three-vessel coronary artery calcifications are noted. There is atherosclerotic calcification of the aorta without evidence of aneurysm. The pulmonary trunk is normal in caliber. Small pulmonary artery filling defects are noted in the subsegmental arteries in the left lower lobe. Evaluation is slightly limited due to respiratory motion. Dilatation of the right ventricle is noted. Mediastinum/Nodes: No enlarged mediastinal, hilar, or axillary lymph nodes. Thyroid gland, trachea, and esophagus demonstrate no significant findings. Lungs/Pleura: There is a trace right pleural effusion with atelectasis at the lung bases. No pneumothorax. Upper Abdomen: Hyperdense material is present in the dependent portion of the urinary bladder, possible stones or sludge. There is gallbladder wall thickening with pericholecystic fat stranding. Musculoskeletal: Degenerative changes are present in the thoracic spine. Kyphoplasty changes are present at T12. No acute osseous abnormality. Review of the MIP images confirms the above findings. IMPRESSION: 1. Small subsegmental pulmonary emboli in the left lower lobe. There is dilatation of the right ventricle which is unchanged from the prior exam and may be related to right heart failure given small thrombus volume. Correlate clinically to exclude superimposed strain. 2. Trace right pleural effusion with atelectasis at the lung bases bilaterally. 3. Layering hyperdense material in the gallbladder, possible stones or sludge, with gallbladder wall thickening and surrounding fat stranding, concerning for cholecystitis. Ultrasound is recommended for further evaluation. 4. Three-vessel coronary artery calcifications. 5. Aortic atherosclerosis. Critical Value/emergent results were called by telephone at the time of interpretation on 08/29/2022 at 3:44 am to provider TIMOTHY OPYD , who verbally acknowledged these results. Electronically Signed   By: Thornell Sartorius M.D.    On: 08/29/2022 03:44   DG Chest 2 View  Result Date: 08/28/2022 CLINICAL DATA:  Dyspnea EXAM: CHEST - 2 VIEW COMPARISON:  09/02/2021 chest radiograph. FINDINGS: Stable cardiomediastinal silhouette with normal heart size. No pneumothorax. No pleural effusion. No pulmonary edema. Mild streaky bibasilar scarring versus atelectasis. No acute consolidative airspace disease. IMPRESSION: Mild streaky bibasilar scarring versus atelectasis. Otherwise no active cardiopulmonary disease. Electronically Signed   By: Delbert Phenix M.D.   On: 08/28/2022 16:59    Labs:  CBC: Recent Labs    04/20/22 1145 08/28/22 1717 08/28/22 1727 08/29/22 0251 08/30/22 0632  WBC 9.5 12.1*  --  11.8* 12.0*  HGB 16.2 13.2 14.6 13.8 15.0  HCT 47.4 40.9 43.0 42.0 47.4  PLT 268.0 209  --  221 287    COAGS: Recent Labs    08/29/22 1140 08/29/22 2106 08/30/22 0632  APTT 56* 60* 77*  BMP: Recent Labs    04/20/22 1145 04/22/22 1122 08/28/22 1717 08/28/22 1727 08/29/22 0251 08/30/22 0632  NA 134*  --  133* 136 134* 137  K 4.4  --  4.4 4.4 4.1 4.5  CL 100  --  102  --  99 101  CO2 25  --  21*  --  21* 25  GLUCOSE 149*  --  181*  --  183* 163*  BUN 17  --  14  --  13 15  CALCIUM 9.7 9.3 8.5*  --  9.2 9.1  CREATININE 0.95  --  0.80  --  0.87 0.92  GFRNONAA  --   --  >60  --  >60 >60    LIVER FUNCTION TESTS: Recent Labs    09/02/21 1541 12/30/21 0944 04/20/22 1145 04/22/22 1122 08/29/22 0251  BILITOT 0.4 0.5 0.4  --  0.4  AST 14 15 18   --  31  ALT 20 19 22   --  30  ALKPHOS  --  81 81 81 70  PROT 7.1 7.1 7.9  --  7.2  ALBUMIN  --  3.9 4.2  --  2.8*    TUMOR MARKERS: No results for input(s): "AFPTM", "CEA", "CA199", "CHROMGRNA" in the last 8760 hours.  Assessment and Plan:  Robert Lang is a 72 yo male being seen today in relation to acute cholecystitis. Patient is also being treated for small left subsegmental pulmonary emboli. Case has been reviewed and approved by Dr Loreta Ave for  image-guided percutaneous cholecystostomy drain placement. Patient is NPO and has had heparin drip held.   Risks and benefits discussed with the patient including, but not limited to bleeding, infection, gallbladder perforation, bile leak, sepsis or even death.  All of the patient's questions were answered, patient is agreeable to proceed. Consent signed and in chart.   Thank you for this interesting consult.  I greatly enjoyed meeting Robert Lang and look forward to participating in their care.  A copy of this report was sent to the requesting provider on this date.  Electronically Signed: Kennieth Francois, [name] 08/30/2022, 10:39 AM   I spent a total of 40 Minutes in face to face in clinical consultation, greater than 50% of which was counseling/coordinating care for acute cholecystitis.

## 2022-08-30 NOTE — Evaluation (Addendum)
Occupational Therapy Evaluation Patient Details Name: Robert Lang MRN: [id number] DOB: 01-16-1951 Today's Date: 08/30/2022   History of Present Illness Patient is 72 y.o. male presented for SOB and fatigue x2-3 weeks. CT revealed small subsegmental PE and cholecystitis. PMH significant of hypertension, hyperlipidemia, atrial fibrillation on anticoagulation, COPD, idiopathic pulmonary fibrosis, periodic limb movement disorder, anxiety, and OSA.   Clinical Impression   Pt reports independence at baseline with ADLs and used walking stick for functional mobility, lives with spouse who can assist at d/c. Pt  needing set up - mod A for ADLs, mod I for bed mobility,and min guard for transfers with RW. Pt SpO2 92% and above on RA during session, pt with 1/4 DOE at end of session. O2 left off at end of session with RN aware. Pt presenting with impairments listed below, will follow acutely. Recommend HHOT at d/c.      Recommendations for follow up therapy are one component of a multi-disciplinary discharge planning process, led by the attending physician.  Recommendations may be updated based on patient status, additional functional criteria and insurance authorization.   Assistance Recommended at Discharge Intermittent Supervision/Assistance  Patient can return home with the following A little help with walking and/or transfers;A little help with bathing/dressing/bathroom;Assistance with cooking/housework;Direct supervision/assist for medications management;Direct supervision/assist for financial management;Help with stairs or ramp for entrance;Assist for transportation    Functional Status Assessment  Patient has had a recent decline in their functional status and demonstrates the ability to make significant improvements in function in a reasonable and predictable amount of time.  Equipment Recommendations  None recommended by OT (pt has all needed DME)    Recommendations for Other Services PT  consult     Precautions / Restrictions Precautions Precautions: Fall Precaution Comments: reports 2 falls in last 6 months maybe 3 falls. Restrictions Weight Bearing Restrictions: No      Mobility Bed Mobility Overal bed mobility: Modified Independent                  Transfers Overall transfer level: Needs assistance Equipment used: Rolling walker (2 wheels) Transfers: Sit to/from Stand Sit to Stand: Min guard           General transfer comment: mild LOB with hallway mobility, able to self correct      Balance Overall balance assessment: Needs assistance Sitting-balance support: Feet supported Sitting balance-Leahy Scale: Good       Standing balance-Leahy Scale: Poor Standing balance comment: reliant on external support                           ADL either performed or assessed with clinical judgement   ADL Overall ADL's : Needs assistance/impaired Eating/Feeding: Set up;Sitting   Grooming: Set up;Sitting   Upper Body Bathing: Minimal assistance;Sitting   Lower Body Bathing: Moderate assistance;Sitting/lateral leans   Upper Body Dressing : Minimal assistance;Sitting   Lower Body Dressing: Moderate assistance;Sitting/lateral leans   Toilet Transfer: Min guard;Ambulation;Regular Toilet;Rolling walker (2 wheels)   Toileting- Clothing Manipulation and Hygiene: Min guard       Functional mobility during ADLs: Min guard;Rolling walker (2 wheels)       Vision Baseline Vision/History: 1 Wears glasses Vision Assessment?: No apparent visual deficits     Perception Perception Perception Tested?: No   Praxis Praxis Praxis tested?: Not tested    Pertinent Vitals/Pain Pain Assessment Pain Assessment: No/denies pain     Hand Dominance Right   Extremity/Trunk Assessment Upper  Extremity Assessment Upper Extremity Assessment: Overall WFL for tasks assessed   Lower Extremity Assessment Lower Extremity Assessment: Defer to PT  evaluation RLE Deficits / Details: grossly 3+/5 to 4-/5 with MMT: hip flex, abd/add; knee flex/ext; DF/PF RLE Sensation: history of peripheral neuropathy RLE Coordination: decreased gross motor LLE Deficits / Details: grossly 4/5 to 4+/5 with MMT: hip flex, abd/add; knee flex/ext; DF/PF LLE Sensation: history of peripheral neuropathy LLE Coordination: WNL   Cervical / Trunk Assessment Cervical / Trunk Assessment: Normal   Communication Communication Communication: No difficulties   Cognition Arousal/Alertness: Awake/alert Behavior During Therapy: WFL for tasks assessed/performed Overall Cognitive Status: Within Functional Limits for tasks assessed                                       General Comments  VSS on RA    Exercises     Shoulder Instructions      Home Living Family/patient expects to be discharged to:: Private residence Living Arrangements: Spouse/significant other Available Help at Discharge: Family Type of Home: House Home Access: Level entry   Entrance Stairs-Rails: None Home Layout: Two level;Able to live on main level with bedroom/bathroom     Bathroom Shower/Tub: Producer, television/film/video: Handicapped height Bathroom Accessibility: Yes   Home Equipment: Educational psychologist (4 wheels);Electric scooter;Cane - single point   Additional Comments: scoot for [place] distances, shopping in mall or store or when on vacation; does not wear oxygen at home, CPAP at night      Prior Functioning/Environment Prior Level of Function : Independent/Modified Independent             Mobility Comments: uses wlaking stick for last 2-3 weeks and prior to that was not using anything in home. uses rollator for around block or to mailbox. ADLs Comments: independent with dressing showering, could cook meals but needs to sit for breaks and prep work, his wife functions as the head chef or if he coks sheis ous chf.        OT Problem  List: Decreased strength;Decreased range of motion;Decreased activity tolerance;Impaired balance (sitting and/or standing);Cardiopulmonary status limiting activity      OT Treatment/Interventions: Self-care/ADL training;Therapeutic exercise;Energy conservation;DME and/or AE instruction    OT Goals(Current goals can be found in the care plan section) Acute Rehab OT Goals Patient Stated Goal: none stated OT Goal Formulation: With patient Time For Goal Achievement: 09/13/22 Potential to Achieve Goals: Good ADL Goals Pt Will Perform Upper Body Dressing: with supervision;sitting Pt Will Perform Lower Body Dressing: sitting/lateral leans;sit to/from stand;with supervision Pt Will Transfer to Toilet: with modified independence;ambulating;regular height toilet Additional ADL Goal #1: pt will verbalize x3 energy conservation strategies in prep for ADLs  OT Frequency: Min 1X/week    Co-evaluation              AM-PAC OT "6 Clicks" Daily Activity     Outcome Measure Help from another person eating meals?: None Help from another person taking care of personal grooming?: None Help from another person toileting, which includes using toliet, bedpan, or urinal?: A Little Help from another person bathing (including washing, rinsing, drying)?: A Lot Help from another person to put on and taking off regular upper body clothing?: A Little Help from another person to put on and taking off regular lower body clothing?: A Lot 6 Click Score: 18   End of Session Equipment Utilized During  Treatment: Rolling walker (2 wheels) Nurse Communication: Mobility status  Activity Tolerance: Patient tolerated treatment well Patient left: in bed;with call bell/phone within reach;with bed alarm set  OT Visit Diagnosis: Unsteadiness on feet (R26.81);Other abnormalities of gait and mobility (R26.89);Muscle weakness (generalized) (M62.81)                Time: 1191-4782 OT Time Calculation (min): 27 min Charges:   OT General Charges $OT Visit: 1 Visit OT Evaluation $OT Eval Moderate Complexity: 1 Mod OT Treatments $Self Care/Home Management : 8-22 mins  Carver Fila, OTD, OTR/L SecureChat Preferred Acute Rehab [phone]   Tyrea Froberg K Koonce 08/30/2022, 1:50 PM

## 2022-08-30 NOTE — Progress Notes (Signed)
      Chief Complaint/Subjective: Pain controlled today, hungry for food  Objective: Vital signs in last 24 hours: Temp:  [97.6 F (36.4 C)-98.3 F (36.8 C)] 98.3 F (36.8 C) (06/10 0826) Pulse Rate:  [81-101] 86 (06/10 0826) Resp:  [20-21] 20 (06/10 0826) BP: (99-163)/(69-87) 138/82 (06/10 0826) SpO2:  [92 %-96 %] 95 % (06/10 0826) Weight:  [87.9 kg] 87.9 kg (06/10 0352) Last BM Date : 08/29/22 Intake/Output from previous day: 06/09 0701 - 06/10 0700 In: 360 [P.O.:360] Out: 3900 [Urine:3900]  PE: Gen: NAd Resp: nonlabored Card: RRR Abd: soft, TTP RUQ  Lab Results:  Recent Labs    08/29/22 0251 08/30/22 0632  WBC 11.8* 12.0*  HGB 13.8 15.0  HCT 42.0 47.4  PLT 221 287   Recent Labs    08/29/22 0251 08/30/22 0632  NA 134* 137  K 4.1 4.5  CL 99 101  CO2 21* 25  GLUCOSE 183* 163*  BUN 13 15  CREATININE 0.87 0.92  CALCIUM 9.2 9.1   No results for input(s): "LABPROT", "INR" in the last 72 hours.    Component Value Date/Time   NA 137 08/30/2022 0632   NA 137 03/05/2020 1343   K 4.5 08/30/2022 0632   CL 101 08/30/2022 0632   CO2 25 08/30/2022 0632   GLUCOSE 163 (H) 08/30/2022 0632   BUN 15 08/30/2022 0632   BUN 14 03/05/2020 1343   CREATININE 0.92 08/30/2022 0632   CREATININE 0.92 09/02/2021 1541   CALCIUM 9.1 08/30/2022 0632   PROT 7.2 08/29/2022 0251   PROT 6.9 04/20/2019 0807   ALBUMIN 2.8 (L) 08/29/2022 0251   ALBUMIN 4.3 04/20/2019 0807   AST 31 08/29/2022 0251   ALT 30 08/29/2022 0251   ALKPHOS 70 08/29/2022 0251   BILITOT 0.4 08/29/2022 0251   BILITOT 0.5 04/20/2019 0807   GFRNONAA >60 08/30/2022 0632   GFRAA 81 03/05/2020 1343    Assessment/Plan 72 yo male with MMPs presented with acute PE and found to have cholecystitis. Pain manageable. WBC persistent. ECHO showing normal function of heart. He is on a heparin drip for PE, history of xarelto for a fib. -Given his acute (PE) on chronic (COPD/OSA) respiratory issues, I think a delayed  procedure is the best option. -I discussed with IR (Dr. Ardelle Anton) briefly and he will review the case.   LOS: 1 day   I reviewed last 24 h vitals and pain scores, last 48 h intake and output, last 24 h labs and trends, and last 24 h imaging results.  This care required high  level of medical decision making.   [facility] 08/30/2022, 8:53 AM Please see Amion for pager number during day hours 7:00am-4:30pm or 7:00am -11:30am on weekends

## 2022-08-30 NOTE — Telephone Encounter (Signed)
FYI to MR- pt admitted with PE

## 2022-08-31 ENCOUNTER — Other Ambulatory Visit (HOSPITAL_COMMUNITY): Payer: Self-pay

## 2022-08-31 ENCOUNTER — Ambulatory Visit: Payer: Medicare Other | Admitting: Internal Medicine

## 2022-08-31 DIAGNOSIS — J441 Chronic obstructive pulmonary disease with (acute) exacerbation: Secondary | ICD-10-CM

## 2022-08-31 DIAGNOSIS — J84112 Idiopathic pulmonary fibrosis: Secondary | ICD-10-CM | POA: Diagnosis not present

## 2022-08-31 DIAGNOSIS — I2693 Single subsegmental pulmonary embolism without acute cor pulmonale: Secondary | ICD-10-CM

## 2022-08-31 DIAGNOSIS — J9601 Acute respiratory failure with hypoxia: Secondary | ICD-10-CM | POA: Diagnosis not present

## 2022-08-31 LAB — CULTURE, RESPIRATORY W GRAM STAIN: Culture: NORMAL

## 2022-08-31 LAB — CBC
HCT: 41.5 % (ref 39.0–52.0)
Hemoglobin: 13.3 g/dL (ref 13.0–17.0)
MCH: 31.1 pg (ref 26.0–34.0)
MCHC: 32 g/dL (ref 30.0–36.0)
MCV: 97 fL (ref 80.0–100.0)
Platelets: 264 10*3/uL (ref 150–400)
RBC: 4.28 MIL/uL (ref 4.22–5.81)
RDW: 13.8 % (ref 11.5–15.5)
WBC: 13.9 10*3/uL — ABNORMAL HIGH (ref 4.0–10.5)
nRBC: 0 % (ref 0.0–0.2)

## 2022-08-31 LAB — APTT
aPTT: 59 seconds — ABNORMAL HIGH (ref 24–36)
aPTT: 88 seconds — ABNORMAL HIGH (ref 24–36)

## 2022-08-31 LAB — BASIC METABOLIC PANEL
Anion gap: 11 (ref 5–15)
BUN: 20 mg/dL (ref 8–23)
CO2: 24 mmol/L (ref 22–32)
Calcium: 8.7 mg/dL — ABNORMAL LOW (ref 8.9–10.3)
Chloride: 101 mmol/L (ref 98–111)
Creatinine, Ser: 1.09 mg/dL (ref 0.61–1.24)
GFR, Estimated: >60 mL/min (ref >60)
Glucose, Bld: 123 mg/dL — ABNORMAL HIGH (ref 70–99)
Potassium: 3.8 mmol/L (ref 3.5–5.1)
Sodium: 136 mmol/L (ref 135–145)

## 2022-08-31 LAB — LEGIONELLA PNEUMOPHILA SEROGP 1 UR AG: L. pneumophila Serogp 1 Ur Ag: NEGATIVE

## 2022-08-31 LAB — HEPARIN LEVEL (UNFRACTIONATED): Heparin Unfractionated: 0.58 IU/mL (ref 0.30–0.70)

## 2022-08-31 MED ORDER — UMECLIDINIUM-VILANTEROL 62.5-25 MCG/ACT IN AEPB
1.0000 | INHALATION_SPRAY | Freq: Every day | RESPIRATORY_TRACT | Status: DC
  Administered 2022-09-01: 1 via RESPIRATORY_TRACT
  Filled 2022-08-31: qty 14

## 2022-08-31 MED ORDER — IPRATROPIUM-ALBUTEROL 0.5-2.5 (3) MG/3ML IN SOLN: 3.0000 mL | RESPIRATORY_TRACT | Status: DC | PRN

## 2022-08-31 NOTE — Progress Notes (Signed)
   08/31/22 2052  BiPAP/CPAP/SIPAP  BiPAP/CPAP/SIPAP Pt Type Adult  Reason BIPAP/CPAP not in use Non-compliant    

## 2022-08-31 NOTE — Consult Note (Signed)
NAME:  Holly Mauch, MRN:  [id number], DOB:  15-Jun-1950, LOS: 2 ADMISSION DATE:  08/28/2022, CONSULTATION DATE:  6/11 REFERRING MD:  Dr. Dartha Lodge, CHIEF COMPLAINT:  hypoxic respiratory failure   History of Present Illness:  Patient is a 72 yo M w/ pertinent PMH COPD, IPF (Seen by Dr. Marchelle Gearing), OSA on cpap, PAF on xarelto presents to [facility] ED on 6/8 w/ SOB.  Patient has been having worsening SOB w/ wheezing over the past 2-3 weeks despite steroids outpatient. Reports sats as low as 80s at home. Came to [facility] on 6/8. Upon arrival, patient afebrile. Sats improved to mid 90s on 2L [place]. Mild tachypnea. Breathing improved w/ iv steroids and duoneb treatments. CXR w/ mild streaky bibasilar atelectasis. Strep pneumonia, resp culture negative. Legionella pending. WBC 12.1 and BNP 168. CTA chest showing small subsegmental PE in LLL. Patient started on heparin. Echo showing w/ LVEF 60-65% and normal RV function. PCCM consulted for pulmonary management.  Also on admission, CT chest showing possible cholecystitis. RUQ confirmed cholecystitis and surgery consulted. Patient started on IV zosyn. On 6/10 perc chole drain placed.   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 M75.00)   Allergic rhinitis due to pollen 09/03/2013   J30.1)  Formatting of this note might be different from the original. J30.1)   Allergy    Anticoagulated 07/01/2016   Anxiety    Arthritis    Atrial fibrillation (HCC)    Atrial flutter (HCC) 06/26/2015   CAD (coronary artery disease)    Chest pain 06/26/2015   COPD (chronic obstructive pulmonary disease) (HCC)    Coronary artery disease 07/06/2018   Cardiac catheterization 2017 showing 90% small diagonal branch disease   DDD (degenerative disc  disease), cervical 09/03/2013   M50.90)  Formatting of this note might be different from the original. M50.90)   Depression    Depression    Depressive disorder 09/03/2013   Dizziness 10/28/2017   Dyslipidemia, goal LDL below 70 07/06/2018   Dyspnea on exertion 10/20/2018   Esophageal reflux 09/03/2013   Essential hypertension 07/06/2018   ETOH abuse 01/11/2019   6 pack of beer per day   Falls 10/28/2017   GERD (gastroesophageal reflux disease)    H/O amiodarone therapy 07/01/2016   Heart attack (HCC)    Heart palpitations 01/27/2017   History of colon polyps    Hyperlipidemia    Hypertension    IPF (idiopathic pulmonary fibrosis) (HCC)    Kidney stones    Neck pain 09/03/2013   Neuropathy 07/06/2018   Obstructive sleep apnea 08/05/2015   PLMD (periodic limb movement disorder) 11/04/2015   Polycythemia, 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 STORY: Due to alcohol/ tobacco   Pure hypercholesterolemia 09/03/2013   E78.0)  Formatting of this note might be different from the original. E78.0)   Screening for prostate cancer 09/03/2013   Smoker 01/11/2019   Smoking 07/06/2018   Status post ablation of atrial flutter 07/06/2018   2017     Significant Hospital Events: Including procedures, antibiotic start and stop dates in addition to other pertinent events   6/8 admitted w/ copd exacerbation; Also noted to have small PE started on heparin; cholecystitis surgery consulted 6/10  IR placed perc chole drain 6/11 pccm consulted  Interim History / Subjective:  Patient in NAD Feels like breathing is a ton better and close to baseline Not on any oxygen at home; on room air with sats 92%  Objective   Blood pressure 121/63, pulse 68, temperature 98 F (36.7 C), temperature source Oral, resp. rate 16, height 5\' 11"  (1.803 m), weight 89 kg, SpO2 94 %.        Intake/Output Summary (Last 24 hours) at  08/31/2022 1332 Last data filed at 08/31/2022 1240 Gross per 24 hour  Intake 2409.23 ml  Output 1650 ml  Net 759.23 ml   Filed Weights   08/29/22 0014 08/30/22 0352 08/31/22 0526  Weight: 89.3 kg 87.9 kg 89 kg    Examination: General:  NAD HEENT: MM pink/moist Neuro: Aox3; MAE CV: s1s2, RRR, no m/r/g PULM:  dim BS bilaterally; room air sats 92% GI: soft, bsx4 active; perc chole drain in place Extremities: warm/dry, no edema Skin: no rashes or lesions    Resolved Hospital Problem list     Assessment & Plan:  Acute hypoxic respiratory failure Possible COPD exacerbation: not taking any inhalers currently at home due to costs; was given stiolto sample back in 08/2021 IPF: followed by Dr. Marchelle Gearing on [place] at home OSA on CPAP Plan: -currently on room air; sat goal >90% -continue po steroids  -will start on LABA/LAMA maintenance inhaler; will need to figure out which maintenance inhalers will be more affordable with insurance for outpatient compliance -prn duoneb for wheezing -continue CPAP qhs -continue Esbriet tabs -pulm toiletry: IS -was scheduled to have repeat PFTs 6/10; will need to reschedule PFTs when breathing is improved and f/u with availabe APP in 1-2 weeks of discharge  Small subsegmental PE Plan: -currently on heparin; can transition to oral DOAC per surgery -check dvt US -telemetry monitoring  Acute cholecystitis CAD PAF Depression Anxiety Plan: -per primary    Best Practice (right click and "Reselect all SmartList Selections" daily)   Per primary  Labs   CBC: Recent Labs  Lab 08/28/22 1717 08/28/22 1727 08/29/22 0251 08/30/22 0632 08/31/22 0202  WBC 12.1*  --  11.8* 12.0* 13.9*  NEUTROABS 10.6*  --   --   --   --   HGB 13.2 14.6 13.8 15.0 13.3  HCT 40.9 43.0 42.0 47.4 41.5  MCV 95.6  --  96.3 98.8 97.0  PLT 209  --  221 287 264    Basic Metabolic Panel: Recent Labs  Lab 08/28/22 1717 08/28/22 1727 08/29/22 0251  08/30/22 0632 08/31/22 0202  NA 133* 136 134* 137 136  K 4.4 4.4 4.1 4.5 3.8  CL 102  --  99 101 101  CO2 21*  --  21* 25 24  GLUCOSE 181*  --  183* 163* 123*  BUN 14  --  13 15 20   CREATININE 0.80  --  0.87 0.92 1.09  CALCIUM 8.5*  --  9.2 9.1 8.7*  MG  --   --  2.2  --   --    GFR: Estimated Creatinine Clearance: 65.2 mL/min (by C-G formula based on SCr of 1.09 mg/dL). Recent Labs  Lab 08/28/22 1717 08/29/22 0251 08/30/22 0632 08/31/22 0202  WBC 12.1* 11.8* 12.0* 13.9*    Liver Function Tests: Recent Labs  Lab 08/29/22 0251  AST 31  ALT 30  ALKPHOS 70  BILITOT 0.4  PROT 7.2  ALBUMIN 2.8*   Recent Labs  Lab 08/29/22 0251  LIPASE  47   No results for input(s): "AMMONIA" in the last 168 hours.  ABG    Component Value Date/Time   PHART 7.407 05/09/2020 0905   PCO2ART 34.5 05/09/2020 0905   PO2ART 81.0 (L) 05/09/2020 0905   HCO3 22.7 08/28/2022 1727   TCO2 24 08/28/2022 1727   ACIDBASEDEF 1.0 08/28/2022 1727   O2SAT 77 08/28/2022 1727     Coagulation Profile: Recent Labs  Lab 08/30/22 1205  INR 1.1    Cardiac Enzymes: No results for input(s): "CKTOTAL", "CKMB", "CKMBINDEX", "TROPONINI" in the last 168 hours.  HbA1C: Hgb A1c MFr Bld  Date/Time Value Ref Range Status  12/30/2021 09:44 AM 5.9 4.6 - 6.5 % Final    Comment:    Glycemic Control Guidelines for People with Diabetes:Non Diabetic:  <6%Goal of Therapy: <7%Additional Action Suggested:  >8%   01/25/2020 11:34 AM 5.1 <5.7 % of total Hgb Final    Comment:    For the purpose of screening for the presence of diabetes: . <5.7%       Consistent with the absence of diabetes 5.7-6.4%    Consistent with increased risk for diabetes             (prediabetes) > or =6.5%  Consistent with diabetes . This assay result is consistent with a decreased risk of diabetes. . Currently, no consensus exists regarding use of hemoglobin A1c for diagnosis of diabetes in children. . According to American  Diabetes Association (ADA) guidelines, hemoglobin A1c <7.0% represents optimal control in non-pregnant diabetic patients. Different metrics may apply to specific patient populations.  Standards of Medical Care in Diabetes(ADA). .     CBG: No results for input(s): "GLUCAP" in the last 168 hours.  Review of Systems:   Review of Systems  Constitutional:  Negative for fever.  Respiratory:  Positive for shortness of breath and wheezing. Negative for cough and sputum production.   Cardiovascular:  Negative for chest pain.  Gastrointestinal:  Negative for abdominal pain, nausea and vomiting.     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), Allergic rhinitis due to pollen (09/03/2013), Allergy, Anticoagulated (07/01/2016), Anxiety, Arthritis, Atrial fibrillation (HCC), Atrial flutter (HCC) (06/26/2015), CAD (coronary artery disease), Chest pain (06/26/2015), COPD (chronic obstructive pulmonary disease) (HCC), Coronary artery disease (07/06/2018), DDD (degenerative disc disease), cervical (09/03/2013), Depression, Depression, Depressive disorder (09/03/2013), Dizziness (10/28/2017), Dyslipidemia, goal LDL below 70 (07/06/2018), Dyspnea on exertion (10/20/2018), Esophageal reflux (09/03/2013), Essential hypertension (07/06/2018), ETOH abuse (01/11/2019), Falls (10/28/2017), GERD (gastroesophageal reflux disease), H/O amiodarone therapy (07/01/2016), Heart attack (HCC), Heart palpitations (01/27/2017), History of colon polyps, Hyperlipidemia, Hypertension, IPF (idiopathic pulmonary fibrosis) (HCC), Kidney stones, Neck pain (09/03/2013), Neuropathy (07/06/2018), Obstructive sleep apnea (08/05/2015), PLMD (periodic limb movement disorder) (11/04/2015), Polycythemia, secondary (09/03/2013), Pure hypercholesterolemia (09/03/2013), Screening for prostate cancer  (09/03/2013), Smoker (01/11/2019), Smoking (07/06/2018), and 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   Surgical History:   Past Surgical History:  Procedure Laterality Date   ATRIAL FIBRILLATION ABLATION  10/2015   BACK SURGERY     CARDIOVERSION  2017   CATARACT EXTRACTION Bilateral 2017   March and April 2017   COLONOSCOPY  2018   CORONARY STENT PLACEMENT  2012   CORONARY/GRAFT ACUTE MI REVASCULARIZATION N/A 01/10/2019   Procedure: CORONARY/GRAFT ACUTE MI REVASCULARIZATION;  Surgeon: Marykay Lex, MD;  Location[facility] INVASIVE CV LAB;  Service: Cardiovascular;  Laterality: N/A;  INGUINAL HERNIA REPAIR     over 20 years ago   IR PERC CHOLECYSTOSTOMY  08/30/2022   LAPAROSCOPIC APPENDECTOMY N/A 01/17/2021   Procedure: APPENDECTOMY LAPAROSCOPIC;  Surgeon: Quentin Ore, MD;  Location: [facility] OR;  Service: General;  Laterality: N/A;   LEFT HEART CATH AND CORONARY ANGIOGRAPHY N/A 01/10/2019   Procedure: LEFT HEART CATH AND CORONARY ANGIOGRAPHY;  Surgeon: Marykay Lex, MD;  Location[facility] INVASIVE CV LAB;  Service: Cardiovascular;  Laterality: N/A;   LUMBAR LAMINECTOMY Bilateral 04/05/2016   L2-L5    VENTRAL HERNIA REPAIR  2018     Social History:   reports that he quit smoking about 3 years ago. His smoking use included cigarettes. He has a 30.00 pack-year smoking history. He has quit using smokeless tobacco. He reports current alcohol use. He reports that he does not use drugs.   Family History:  His family history includes Alcohol abuse in his brother and father; Anxiety disorder in his mother; Colon cancer in his father; Depression in his brother; Throat cancer in his brother.   Allergies Allergies  Allergen Reactions   Naproxen Other (See Comments)    (Naprosyn *ANALGESICS - ANTI-INFLAMMATORY*) Nausea, Abdominal pain   Silodosin Other (See Comments)    Low blood pressure     Home Medications  Prior to Admission medications   Medication Sig Start Date End  Date Taking? Authorizing Provider  atorvastatin (LIPITOR) 80 MG tablet Take 1 tablet (80 mg total) by mouth daily. 04/28/22  Yes Georgeanna Lea, MD  buPROPion (WELLBUTRIN XL) 300 MG 24 hr tablet Take 1 tablet (300 mg total) by mouth daily. 08/20/22  Yes White, Arlys  A, NP  busPIRone (BUSPAR) 15 MG tablet Take 1 tablet (15 mg total) by mouth 3 (three) times daily. 08/20/22  Yes Joan Flores, NP  Cholecalciferol 25 MCG (1000 UT) tablet Take 1,000 Units by mouth daily.    Yes [provider]  docusate sodium (COLACE) 100 MG capsule Take 100 mg by mouth daily.   Yes [provider]  famotidine (PEPCID) 40 MG tablet TAKE 1 TABLET(40 MG) BY MOUTH AT BEDTIME Patient taking differently: Take 40 mg by mouth at bedtime. 08/11/22  Yes Georgeanna Lea, MD  fexofenadine (ALLEGRA) 180 MG tablet Take 180 mg by mouth at bedtime.  04/09/08  Yes [provider]  finasteride (PROSCAR) 5 MG tablet Take 1 tablet (5 mg total) by mouth daily. 03/10/21  Yes Georgeanna Lea, MD  Ginger, Zingiber officinalis, (GINGER ROOT) 550 MG CAPS Take 550 mg by mouth daily.   Yes [provider]  lamoTRIgine (LAMICTAL) 25 MG tablet Take one tablet by mouth 25 mg daily for 14 days, then take two tablets 50 mg total daily. Patient taking differently: Take 25 mg by mouth daily. started 25mg  08/22/22 08/20/22  Yes White, Arlys  A, NP  Lidocaine 4 % LOTN Use as needed in the bilateral lower extremities Patient taking differently: Apply 1 application  topically as needed (LE pain). Use as needed in the bilateral lower extremities 03/01/22  Yes Camara, Amadou, MD  melatonin 5 MG TABS Take 10 mg by mouth at bedtime.   Yes [provider]  Multiple Vitamins-Minerals (PRESERVISION AREDS PO) Take 1 capsule by mouth in the morning and at bedtime.   Yes [provider]  nitroGLYCERIN (NITROSTAT) 0.4 MG SL tablet Place 1 tablet (0.4 mg total) under the tongue every 5 (five) minutes as needed  for chest pain. 03/10/21  Yes Gypsy Balsam  J, MD  pantoprazole (PROTONIX) 40 MG tablet TAKE 1 TABLET(40 MG) BY MOUTH DAILY Patient taking differently: Take 40 mg by mouth daily. 07/01/22  Yes Lynann Bologna, MD  Pirfenidone (ESBRIET) 801 MG TABS Take 1 tablet (801 mg total) by mouth 3 (three) times daily. 06/24/22  Yes Kalman Shan, MD  Polyethylene Glycol 3350 (MIRALAX PO) Take 17 g by mouth daily.   Yes [provider]  predniSONE (DELTASONE) 10 MG tablet 4 tabs for 2 days, then 3 tabs for 2 days, 2 tabs for 2 days, then 1 tab for 2 days, then stop 08/27/22  Yes Glenford Bayley, NP  pregabalin (LYRICA) 150 MG capsule Take 1 capsule (150 mg total) by mouth 2 (two) times daily. 07/15/22  Yes Windell Norfolk, MD  ranolazine (RANEXA) 1000 MG SR tablet Take 1 tablet (1,000 mg total) by mouth 2 (two) times daily. 05/27/22  Yes Georgeanna Lea, MD  sertraline (ZOLOFT) 100 MG tablet Take 1.5 tablets (150 mg total) by mouth at bedtime. 06/28/22  Yes Joan Flores, NP  traZODone (DESYREL) 50 MG tablet Take 1 tablet (50 mg total) by mouth at bedtime as needed for sleep (May take 1/2 tab to 1 tablet for sleep, as needed.). 06/28/22  Yes White, Watt Climes, NP  vitamin B-12 (CYANOCOBALAMIN) 1000 MCG tablet Take 1,000 mcg by mouth daily.   Yes [provider]  Vitamin D, Ergocalciferol, (DRISDOL) 1.25 MG (50000 UNIT) CAPS capsule Take 1 capsule (50,000 Units total) by mouth every 7 (seven) days. 05/19/22  Yes [name], [name], [name]  XARELTO 20 MG TABS tablet TAKE 1 TABLET BY MOUTH DAILY WITH SUPPER 08/23/22  Yes Georgeanna Lea, MD  zolpidem (AMBIEN) 5 MG tablet Take 1 tablet (5 mg total) by mouth at bedtime as needed for sleep. 06/15/22 12/12/22 Yes Windell Norfolk, MD  LORazepam (ATIVAN) 0.5 MG tablet Take one tablet by mouth only for severe anxiety or agitation Patient not taking: Reported on 08/29/2022 08/20/22   Joan Flores, NP         [facility] 08/31/2022, 1:32 PM  Please see [email or web address] for pager details.  From 7A-7P if no response, please call [phone]. After hours, please call ELink [phone].

## 2022-08-31 NOTE — Telephone Encounter (Signed)
Please arrange for hospital follow-up in 2 to 4 weeks with MR/APP -Admitted for acute cholecystitis and subsegmental PE History of IPF

## 2022-08-31 NOTE — TOC Initial Note (Signed)
Transition of Care [facility]) - Initial/Assessment Note    Patient Details  Name: Robert Lang MRN: [id number] Date of Birth: 08-21-1950  Transition of Care [facility]) CM/SW Contact:    Gala Lewandowsky, RN Phone Number: 08/31/2022, 3:33 PM  Clinical Narrative:  Patient presented for shortness of breath and fatigue. PTA patient states he was from home with spouse. Patient has DME rollator, cane, and electric scooter. Case Manager discussed home health PT/OT and the patient declined services. Case Manager discussed with patient to call his PCP if has needs in the future. MD is aware of patient declining services. No further home needs identified at this time.                 Expected Discharge Plan: Home/Self Care Barriers to Discharge: No Barriers Identified   Patient Goals and CMS Choice Patient states their goals for this hospitalization and ongoing recovery are:: Patient wants to return home.   Choice offered to / list presented to : NA    Expected Discharge Plan and Services In-house Referral: NA Discharge Planning Services: CM Consult Post Acute Care Choice: NA Living arrangements for the past 2 months: Single Family Home                   DME Agency: NA       HH Arranged: Refused HH   Prior Living Arrangements/Services Living arrangements for the past 2 months: Single Family Home Lives with:: Spouse Patient language and need for interpreter reviewed:: Yes Do you feel safe going back to the place where you live?: Yes      Need for Family Participation in Patient Care: Yes (Comment) Care giver support system in place?: Yes (comment) Current home services: DME (cane, rollator, electric scooter.) Criminal Activity/Legal Involvement Pertinent to Current Situation/Hospitalization: No - Comment as needed  Activities of Daily Living Home Assistive Devices/Equipment: CPAP, Blood pressure cuff, Shower chair with back, Cane (specify quad or straight), Eyeglasses,  Hand-held shower hose, Raised toilet seat with rails, Scales, Walker (specify type) (straight cane, raised toiled seat e/out rails, rollater) ADL Screening (condition at time of admission) Patient's cognitive ability adequate to safely complete daily activities?: Yes Is the patient deaf or have difficulty hearing?: No Does the patient have difficulty seeing, even when wearing glasses/contacts?: No Does the patient have difficulty concentrating, remembering, or making decisions?: No Patient able to express need for assistance with ADLs?: Yes Does the patient have difficulty dressing or bathing?: Yes (d/t breathing) Independently performs ADLs?: No (d/t breathing) Communication: Independent Dressing (OT): Needs assistance (intermittently) Is this a change from baseline?: Change from baseline, expected to last >3 days Grooming: Independent Feeding: Independent Bathing: Needs assistance Is this a change from baseline?: Change from baseline, expected to last >3 days Toileting: Independent In/Out Bed: Independent with device (comment) Walks in Home: Independent with device (comment) Does the patient have difficulty walking or climbing stairs?: Yes (d/t SHOB) Weakness of Legs: None Weakness of Arms/Hands: None  Permission Sought/Granted Permission sought to share information with : Case Manager, Family Supports    Emotional Assessment Appearance:: Appears stated age Attitude/Demeanor/Rapport: Engaged Affect (typically observed): Appropriate Orientation: : Oriented to Self, Oriented to Place, Oriented to  Time, Oriented to Situation Alcohol / Substance Use: Not Applicable Psych Involvement: No (comment)  Admission diagnosis:  Acute on chronic respiratory failure with hypoxia (HCC) [J96.21] Acute respiratory failure with hypoxemia (HCC) [J96.01] COPD with exacerbation (HCC) [J44.1] Patient Active Problem List   Diagnosis Date Noted  Single subsegmental pulmonary embolism without acute  cor pulmonale (HCC) 08/31/2022   COPD exacerbation (HCC) 08/31/2022   COPD with exacerbation (HCC) 08/29/2022   Acute respiratory failure with hypoxemia (HCC) 08/28/2022   Centrilobular emphysema (HCC) 09/03/2021   Acute bacterial rhinosinusitis 09/03/2021   Hospital discharge follow-up 09/03/2021   Cognitive changes 09/03/2021   Recurrent syncope 08/27/2021   Acute metabolic encephalopathy 08/27/2021   Lactic acidosis 08/27/2021   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/27/2021   History of COVID-19 08/27/2021   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   Appendicitis 01/15/2021   Alcohol abuse 01/15/2021   OSA (obstructive sleep apnea) 01/15/2021   Sepsis (HCC) 01/15/2021   IPF (idiopathic pulmonary fibrosis) (HCC) 07/28/2020   Snoring 05/13/2020   Kidney stones    Hypertension    Hyperlipidemia    History of colon polyps    Heart attack (HCC)    GERD (gastroesophageal reflux disease)    Depression    CAD (coronary artery disease)    Atrial fibrillation (HCC)    Arthritis    Anxiety    Allergy    Smoker 01/11/2019   ETOH abuse 01/11/2019   Acute ST elevation myocardial infarction (STEMI) of inferolateral wall (HCC) 01/10/2019   Dyspnea on exertion 10/20/2018   Status post ablation of atrial flutter 07/06/2018   Coronary artery disease 07/06/2018   Essential hypertension 07/06/2018   Dyslipidemia, goal LDL below 70 07/06/2018   Smoking 07/06/2018   Neuropathy 07/06/2018   Dizziness 10/28/2017   Falls 10/28/2017   Heart palpitations 01/27/2017   Anticoagulated 07/01/2016   H/O amiodarone therapy 07/01/2016   PLMD (periodic limb movement disorder) 11/04/2015   Obstructive sleep apnea 08/05/2015   Atrial flutter (HCC) 06/26/2015   Chest pain 06/26/2015   COPD (chronic obstructive pulmonary disease) (HCC) 06/26/2015   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   Allergic rhinitis due to pollen 09/03/2013   DDD (degenerative disc disease), cervical 09/03/2013   Depressive disorder  09/03/2013   Esophageal reflux 09/03/2013   Neck pain 09/03/2013   Polycythemia, secondary 09/03/2013   Pure hypercholesterolemia 09/03/2013   Screening for prostate cancer 09/03/2013   PCP:  [name], [name] Pharmacy:   [id number] - [place], [place] - [address] AT [facility] [address] [place] [address] Phone: [phone] Fax: [phone]  [facility] [address] [place] [address] Phone: [phone] Fax: [phone]  [facility] - [place], [place] - [address] [place] [address] Phone: [phone] Fax: [phone]  Social Determinants of Health (SDOH) Social History: SDOH Screenings   Food Insecurity: No Food Insecurity (08/29/2022)  Housing: Low Risk  (08/29/2022)  Transportation Needs: No Transportation Needs (08/29/2022)  Utilities: Not At Risk (08/29/2022)  Alcohol Screen: Low Risk  (11/25/2020)  Depression (PHQ2-9): Low Risk  (07/09/2022)  Financial Resource Strain: Low Risk  (12/22/2021)  Physical Activity: Inactive (12/22/2021)  Social Connections: Moderately Integrated (12/22/2021)  Stress: No Stress Concern Present (12/22/2021)  Tobacco Use: Medium Risk (08/31/2022)    Readmission Risk Interventions     No data to display

## 2022-08-31 NOTE — TOC Benefit Eligibility Note (Signed)
Patient Product/process development scientist completed.    The patient is currently admitted and upon discharge could be taking Eliquis 5 mg.  The current 30 day co-pay is $52.60 due to being in Coverage Gap (donut hole).   The patient is insured through [facility] Part D   This test claim was processed through [facility]- copay amounts may vary at other pharmacies due to pharmacy/plan contracts, or as the patient moves through the different stages of their insurance plan.  Roland Earl, CPHT Pharmacy Patient Advocate Specialist [facility] Patient Advocate Team Direct Number: [phone]  Fax: [phone]

## 2022-08-31 NOTE — Progress Notes (Signed)
ANTICOAGULATION CONSULT NOTE - Follow Up Consult  Pharmacy Consult for heparin Indication: atrial fibrillation and pulmonary embolus  Labs: Recent Labs    08/29/22 0251 08/29/22 1140 08/29/22 1140 08/29/22 2106 08/30/22 0632 08/30/22 1205 08/31/22 0202  HGB 13.8  --   --   --  15.0  --  13.3  HCT 42.0  --   --   --  47.4  --  41.5  PLT 221  --   --   --  287  --  264  APTT  --  56*   < > 60* 77*  --  59*  LABPROT  --   --   --   --   --  14.5  --   INR  --   --   --   --   --  1.1  --   HEPARINUNFRC  --  0.65  --   --  0.92*  --  0.58  CREATININE 0.87  --   --   --  0.92  --  1.09   < > = values in this interval not displayed.    Assessment: 72yo male subtherapeutic on heparin after resumed s/p drain placement; no infusion issues or signs of bleeding per RN.  Goal of Therapy:  aPTT 66-102 seconds   Plan:  Increase heparin infusion by ~10% to 2000 units/hr. Check level in 6 hours.   Vernard Gambles, PharmD, BCPS 08/31/2022 4:17 AM

## 2022-08-31 NOTE — Progress Notes (Signed)
      Chief Complaint/Subjective: Pain controlled tolerating diet  Objective: Vital signs in last 24 hours: Temp:  [97.7 F (36.5 C)-98 F (36.7 C)] 98 F (36.7 C) (06/11 0834) Pulse Rate:  [82-90] 87 (06/11 0834) Resp:  [16-22] 16 (06/11 0834) BP: (96-164)/(67-99) 96/73 (06/11 0834) SpO2:  [89 %-96 %] 94 % (06/11 0834) Weight:  [89 kg] 89 kg (06/11 0526) Last BM Date : 08/29/22 Intake/Output from previous day: 06/10 0701 - 06/11 0700 In: 2409.2 [P.O.:480; I.V.:1731.5; IV Piggyback:187.7] Out: 975 [Urine:850; Drains:125]  PE: Gen: NAd Resp: nonlabored Card: reg rate Abd: soft, drain with bilious output  Lab Results:  Recent Labs    08/30/22 0632 08/31/22 0202  WBC 12.0* 13.9*  HGB 15.0 13.3  HCT 47.4 41.5  PLT 287 264   Recent Labs    08/30/22 0632 08/31/22 0202  NA 137 136  K 4.5 3.8  CL 101 101  CO2 25 24  GLUCOSE 163* 123*  BUN 15 20  CREATININE 0.92 1.09  CALCIUM 9.1 8.7*   Recent Labs    08/30/22 1205  LABPROT 14.5  INR 1.1      Component Value Date/Time   NA 136 08/31/2022 0202   NA 137 03/05/2020 1343   K 3.8 08/31/2022 0202   CL 101 08/31/2022 0202   CO2 24 08/31/2022 0202   GLUCOSE 123 (H) 08/31/2022 0202   BUN 20 08/31/2022 0202   BUN 14 03/05/2020 1343   CREATININE 1.09 08/31/2022 0202   CREATININE 0.92 09/02/2021 1541   CALCIUM 8.7 (L) 08/31/2022 0202   PROT 7.2 08/29/2022 0251   PROT 6.9 04/20/2019 0807   ALBUMIN 2.8 (L) 08/29/2022 0251   ALBUMIN 4.3 04/20/2019 0807   AST 31 08/29/2022 0251   ALT 30 08/29/2022 0251   ALKPHOS 70 08/29/2022 0251   BILITOT 0.4 08/29/2022 0251   BILITOT 0.5 04/20/2019 0807   GFRNONAA >60 08/31/2022 0202   GFRAA 81 03/05/2020 1343    Assessment/Plan 72 yo male with cholecystitis s/p IR perc cholecystostomy tube FEN - reg diet VTE - heparin drip, can transition to oral med ID - zosyn 6/9 --> goal for 5 days of antibiotics after procedure Disposition - acute PE requiring anticoagulation,  will continue to monitor, follow up with me in 6 weeks   LOS: 2 days   I reviewed last 24 h vitals and pain scores, last 48 h intake and output, last 24 h labs and trends, and last 24 h imaging results.  This care required moderate level of medical decision making.   De Blanch [facility] 08/31/2022, 9:41 AM Please see Amion for pager number during day hours 7:00am-4:30pm or 7:00am -11:30am on weekends

## 2022-08-31 NOTE — Progress Notes (Signed)
   08/30/22 2300  BiPAP/CPAP/SIPAP  BiPAP/CPAP/SIPAP Pt Type Adult (refused our unit; hasn;t been using; rather use supplemental O2 @2L  until he gets home)

## 2022-08-31 NOTE — Progress Notes (Signed)
Physical Therapy Treatment Patient Details Name: Robert Lang MRN: [id number] DOB: 02/02/1951 Today's Date: 08/31/2022   History of Present Illness Patient is 72 y.o. male presented for SOB and fatigue x2-3 weeks. CT revealed small subsegmental PE and cholecystitis. PMH significant of hypertension, hyperlipidemia, atrial fibrillation on anticoagulation, COPD, idiopathic pulmonary fibrosis, periodic limb movement disorder, anxiety, and OSA.    PT Comments    Pt with much improved mobility compared to yesterday. Will continue to follow acutely to work on balance and functional activity tolerance. Pt declines follow up therapy. He will talk to primary care physician if he changes his mind in the future.    Recommendations for follow up therapy are one component of a multi-disciplinary discharge planning process, led by the attending physician.  Recommendations may be updated based on patient status, additional functional criteria and insurance authorization.  Follow Up Recommendations       Assistance Recommended at Discharge Intermittent Supervision/Assistance  Patient can return home with the following A little help with bathing/dressing/bathroom;Assistance with cooking/housework;Help with stairs or ramp for entrance   Equipment Recommendations  None recommended by PT    Recommendations for Other Services       Precautions / Restrictions Precautions Precautions: Fall Precaution Comments: reports 2 falls in last 6 months maybe 3 falls. Restrictions Weight Bearing Restrictions: No     Mobility  Bed Mobility Overal bed mobility: Modified Independent, Needs Assistance Bed Mobility: Supine to Sit, Sit to Supine     Supine to sit: Modified independent (Device/Increase time) Sit to supine: Modified independent (Device/Increase time)        Transfers Overall transfer level: Modified independent Equipment used: None, Rollator (4 wheels) Transfers: Sit to/from Stand, Bed to  chair/wheelchair/BSC Sit to Stand: Modified independent (Device/Increase time)   Step pivot transfers: Modified independent (Device/Increase time)       General transfer comment: Able to perform sit to stand with or without UE support. Bed to chair without assistive device    Ambulation/Gait Ambulation/Gait assistance: Supervision Gait Distance (Feet): 270 Feet Assistive device: Rollator (4 wheels) Gait Pattern/deviations: Step-through pattern, Decreased stride length Gait velocity: decr Gait velocity interpretation: 1.31 - 2.62 ft/sec, indicative of limited community ambulator   General Gait Details: supervision for safety and lines   Stairs             Wheelchair Mobility    Modified Rankin (Stroke Patients Only)       Balance Overall balance assessment: Needs assistance Sitting-balance support: Feet supported Sitting balance-Leahy Scale: Good     Standing balance support: No upper extremity supported Standing balance-Leahy Scale: Fair                              Cognition Arousal/Alertness: Awake/alert Behavior During Therapy: WFL for tasks assessed/performed Overall Cognitive Status: Within Functional Limits for tasks assessed                                          Exercises Other Exercises Other Exercises: Repeated sit to stand x 5 Other Exercises: Instructed in standing at counter with UE support as needed for single leg stance Other Exercises: Instructed in standing and reachint ex's with countertop support as needed    General Comments General comments (skin integrity, edema, etc.): VSS on RA      Pertinent Vitals/Pain Pain Assessment Pain  Assessment: No/denies pain    Home Living                          Prior Function            PT Goals (current goals can now be found in the care plan section) Acute Rehab PT Goals Patient Stated Goal: go to beach with family next week PT Goal Formulation:  With patient Time For Goal Achievement: 09/13/22 Potential to Achieve Goals: Good Progress towards PT goals: Goals met and updated - see care plan    Frequency    Min 1X/week      PT Plan Discharge plan needs to be updated    Co-evaluation              AM-PAC PT "6 Clicks" Mobility   Outcome Measure  Help needed turning from your back to your side while in a flat bed without using bedrails?: None Help needed moving from lying on your back to sitting on the side of a flat bed without using bedrails?: None Help needed moving to and from a bed to a chair (including a wheelchair)?: None Help needed standing up from a chair using your arms (e.g., wheelchair or bedside chair)?: None Help needed to walk in hospital room?: A Little Help needed climbing 3-5 steps with a railing? : A Little 6 Click Score: 22    End of Session   Activity Tolerance: Patient tolerated treatment well Patient left: in chair;with call bell/phone within reach   PT Visit Diagnosis: Muscle weakness (generalized) (M62.81);Difficulty in walking, not elsewhere classified (R26.2);Other abnormalities of gait and mobility (R26.89)     Time: 1530-1550 PT Time Calculation (min) (ACUTE ONLY): 20 min  Charges:  $Gait Training: 8-22 mins                     [facility] PT Acute Rehabilitation Services Office [phone]    Angelina Ok [facility] 08/31/2022, 4:15 PM

## 2022-08-31 NOTE — Progress Notes (Signed)
Referring Physician(s): Dr Sheliah Hatch  Supervising Physician: Mir, Mauri Reading  Patient Status:  [facility] - In-pt  Chief Complaint:  Cholecystitis   Subjective:  IR procedure yesterday:   Procedure: Image guided drain placement, perc chole.  34F pigtail drain.   Up in chair Doing well Drain flushes easily; ~ 75 cc in bag now - bilious  Allergies: Naproxen and Silodosin  Medications: Prior to Admission medications   Medication Sig Start Date End Date Taking? Authorizing Provider  atorvastatin (LIPITOR) 80 MG tablet Take 1 tablet (80 mg total) by mouth daily. 04/28/22  Yes Georgeanna Lea, MD  buPROPion (WELLBUTRIN XL) 300 MG 24 hr tablet Take 1 tablet (300 mg total) by mouth daily. 08/20/22  Yes White, Arlys John A, NP  busPIRone (BUSPAR) 15 MG tablet Take 1 tablet (15 mg total) by mouth 3 (three) times daily. 08/20/22  Yes Joan Flores, NP  Cholecalciferol 25 MCG (1000 UT) tablet Take 1,000 Units by mouth daily.    Yes [provider]  docusate sodium (COLACE) 100 MG capsule Take 100 mg by mouth daily.   Yes [provider]  famotidine (PEPCID) 40 MG tablet TAKE 1 TABLET(40 MG) BY MOUTH AT BEDTIME Patient taking differently: Take 40 mg by mouth at bedtime. 08/11/22  Yes Georgeanna Lea, MD  fexofenadine (ALLEGRA) 180 MG tablet Take 180 mg by mouth at bedtime.  04/09/08  Yes [provider]  finasteride (PROSCAR) 5 MG tablet Take 1 tablet (5 mg total) by mouth daily. 03/10/21  Yes Georgeanna Lea, MD  Ginger, Zingiber officinalis, (GINGER ROOT) 550 MG CAPS Take 550 mg by mouth daily.   Yes [provider]  lamoTRIgine (LAMICTAL) 25 MG tablet Take one tablet by mouth 25 mg daily for 14 days, then take two tablets 50 mg total daily. Patient taking differently: Take 25 mg by mouth daily. started 25mg  08/22/22 08/20/22  Yes White, Arlys John A, NP  Lidocaine 4 % LOTN Use as needed in the bilateral lower extremities Patient taking differently: Apply 1  application  topically as needed (LE pain). Use as needed in the bilateral lower extremities 03/01/22  Yes Camara, Amadou, MD  melatonin 5 MG TABS Take 10 mg by mouth at bedtime.   Yes [provider]  Multiple Vitamins-Minerals (PRESERVISION AREDS PO) Take 1 capsule by mouth in the morning and at bedtime.   Yes [provider]  nitroGLYCERIN (NITROSTAT) 0.4 MG SL tablet Place 1 tablet (0.4 mg total) under the tongue every 5 (five) minutes as needed for chest pain. 03/10/21  Yes Georgeanna Lea, MD  pantoprazole (PROTONIX) 40 MG tablet TAKE 1 TABLET(40 MG) BY MOUTH DAILY Patient taking differently: Take 40 mg by mouth daily. 07/01/22  Yes Lynann Bologna, MD  Pirfenidone (ESBRIET) 801 MG TABS Take 1 tablet (801 mg total) by mouth 3 (three) times daily. 06/24/22  Yes Kalman Shan, MD  Polyethylene Glycol 3350 (MIRALAX PO) Take 17 g by mouth daily.   Yes [provider]  predniSONE (DELTASONE) 10 MG tablet 4 tabs for 2 days, then 3 tabs for 2 days, 2 tabs for 2 days, then 1 tab for 2 days, then stop 08/27/22  Yes Glenford Bayley, NP  pregabalin (LYRICA) 150 MG capsule Take 1 capsule (150 mg total) by mouth 2 (two) times daily. 07/15/22  Yes Windell Norfolk, MD  ranolazine (RANEXA) 1000 MG SR tablet Take 1 tablet (1,000 mg total) by mouth 2 (two) times daily. 05/27/22  Yes Gypsy Balsam  J, MD  sertraline (ZOLOFT) 100 MG tablet Take 1.5 tablets (150 mg total) by mouth at bedtime. 06/28/22  Yes Joan Flores, NP  traZODone (DESYREL) 50 MG tablet Take 1 tablet (50 mg total) by mouth at bedtime as needed for sleep (May take 1/2 tab to 1 tablet for sleep, as needed.). 06/28/22  Yes White, Watt Climes, NP  vitamin B-12 (CYANOCOBALAMIN) 1000 MCG tablet Take 1,000 mcg by mouth daily.   Yes [provider]  Vitamin D, Ergocalciferol, (DRISDOL) 1.25 MG (50000 UNIT) CAPS capsule Take 1 capsule (50,000 Units total) by mouth every 7 (seven) days. 05/19/22  Yes [name], [name], [name]   XARELTO 20 MG TABS tablet TAKE 1 TABLET BY MOUTH DAILY WITH SUPPER 08/23/22  Yes Georgeanna Lea, MD  zolpidem (AMBIEN) 5 MG tablet Take 1 tablet (5 mg total) by mouth at bedtime as needed for sleep. 06/15/22 12/12/22 Yes Windell Norfolk, MD  LORazepam (ATIVAN) 0.5 MG tablet Take one tablet by mouth only for severe anxiety or agitation Patient not taking: Reported on 08/29/2022 08/20/22   Joan Flores, NP     Vital Signs: BP 121/63 (BP Location: Left Arm)   Pulse 68   Temp 98 F (36.7 C) (Oral)   Resp 16   Ht 5\' 11"  (1.803 m)   Wt 196 lb 1.6 oz (89 kg)   SpO2 94%   BMI 27.35 kg/m   Physical Exam Vitals reviewed.  Skin:    Comments: Site is clean and dry NT No bleeding OP bilious Drain intact; C/D/I      Imaging: IR Perc Cholecystostomy  Result Date: 08/31/2022 INDICATION: 72 year old male referred for percutaneous cholecystostomy EXAM: CHOLECYSTOSTOMY MEDICATIONS: None ANESTHESIA/SEDATION: Moderate (conscious) sedation was employed during this procedure. A total of Versed 1.0 mg and Fentanyl 75 mcg was administered intravenously. Moderate Sedation Time: 17 minutes. The patient's level of consciousness and vital signs were monitored continuously by radiology nursing throughout the procedure under my direct supervision. FLUOROSCOPY TIME:  Fluoroscopy Time: 3 mGy). COMPLICATIONS: None PROCEDURE: Informed written consent was obtained from the patient and the patient's family after a thorough discussion of the procedural risks, benefits and alternatives. All questions were addressed. Maximal Sterile Barrier Technique was utilized including caps, mask, sterile gowns, sterile gloves, sterile drape, hand hygiene and skin antiseptic. A timeout was performed prior to the initiation of the procedure. Ultrasound survey of the right upper quadrant was performed for planning purposes. Once the patient is prepped and draped in the usual sterile fashion, the skin and subcutaneous tissues overlying  the gallbladder were generously infiltrated 1% lidocaine for local anesthesia. A coaxial needle was advanced under ultrasound guidance through the skin subcutaneous tissues and a small segment of liver into the gallbladder lumen. With removal of the stylet, spontaneous dark bile drainage occurred. Using modified Seldinger technique, a 10 French drain was placed into the gallbladder fossa, with aspiration of the sample for the lab. Contrast injection confirmed position of the tube within the gallbladder lumen. Drainage catheter was attached to gravity drain with a suture retention placed. Patient tolerated the procedure well and remained hemodynamically stable throughout. No complications were encountered and no significant blood loss encountered. IMPRESSION: Status post image guided percutaneous cholecystostomy Signed, Yvone Neu. Miachel Roux, RPVI [facility] Electronically Signed   By: Gilmer Mor D.O.   On: 08/31/2022 08:28   ECHOCARDIOGRAM COMPLETE  Result Date: 08/29/2022    ECHOCARDIOGRAM REPORT   Patient Name:  Robert Lang Date of Exam: 08/29/2022 Medical Rec #:  [id number]         Height:       71.0 in Accession #:    [id number]        Weight:       196.8 lb Date of Birth:  [birth year]          BSA:          2.094 m Patient Age:    72 years          BP:           163/87 mmHg Patient Gender: M                 HR:           97 bpm. Exam Location:  Inpatient Procedure: 2D Echo, Cardiac Doppler and Color Doppler Indications:    Pulmonary Embolus I26.09  History:        Patient has prior history of Echocardiogram examinations, most                 recent 05/19/2022. CAD and Previous Myocardial Infarction, COPD                 and ETOH abuse; Risk Factors:Sleep Apnea, Dyslipidemia, Former                 Smoker and Hypertension.  Sonographer:    Dondra Prader RVT RCS Referring Phys: [id number] TIMOTHY S OPYD  Sonographer Comments: Suboptimal apical  window, Technically challenging study due to limited acoustic windows and Technically difficult study due to poor echo windows. Declined Definity IMPRESSIONS  1. Left ventricular ejection fraction, by estimation, is 60 to 65%. The left ventricle has normal function. Left ventricular endocardial border not optimally defined to evaluate regional wall motion. Left ventricular diastolic parameters are indeterminate.  2. Right ventricular systolic function is normal. The right ventricular size is normal. Tricuspid regurgitation signal is inadequate for assessing PA pressure.  3. The mitral valve is normal in structure. No evidence of mitral valve regurgitation. No evidence of mitral stenosis.  4. The aortic valve is tricuspid. There is mild calcification of the aortic valve. There is mild thickening of the aortic valve. Aortic valve regurgitation is not visualized. No aortic stenosis is present.  5. The inferior vena cava is normal in size with greater than 50% respiratory variability, suggesting right atrial pressure of 3 mmHg. FINDINGS  Left Ventricle: Left ventricular ejection fraction, by estimation, is 60 to 65%. The left ventricle has normal function. Left ventricular endocardial border not optimally defined to evaluate regional wall motion. The left ventricular internal cavity size was normal in size. There is no left ventricular hypertrophy. Left ventricular diastolic parameters are indeterminate. Right Ventricle: The right ventricular size is normal. Right vetricular wall thickness was not well visualized. Right ventricular systolic function is normal. Tricuspid regurgitation signal is inadequate for assessing PA pressure. Left Atrium: Left atrial size was normal in size. Right Atrium: Right atrial size was normal in size. Pericardium: There is no evidence of pericardial effusion. Mitral Valve: The mitral valve is normal in structure. No evidence of mitral valve regurgitation. No evidence of mitral valve  stenosis. Tricuspid Valve: The tricuspid valve is normal in structure. Tricuspid valve regurgitation is not demonstrated. No evidence of tricuspid stenosis. Aortic Valve: The aortic valve is tricuspid. There is mild calcification of the aortic valve. There is mild thickening of the aortic valve. There is mild aortic valve annular  calcification. Aortic valve regurgitation is not visualized. No aortic stenosis  is present. Aortic valve mean gradient measures 3.0 mmHg. Aortic valve peak gradient measures 6.0 mmHg. Aortic valve area, by VTI measures 2.84 cm. Pulmonic Valve: The pulmonic valve was not well visualized. Pulmonic valve regurgitation is not visualized. No evidence of pulmonic stenosis. Aorta: The aortic root is normal in size and structure. Venous: The inferior vena cava is normal in size with greater than 50% respiratory variability, suggesting right atrial pressure of 3 mmHg. IAS/Shunts: No atrial level shunt detected by color flow Doppler.  LEFT VENTRICLE PLAX 2D LVIDd:         5.00 cm   Diastology LVIDs:         3.50 cm   LV e' medial:    6.96 cm/s LV PW:         1.00 cm   LV E/e' medial:  14.2 LV IVS:        1.00 cm   LV e' lateral:   10.40 cm/s LVOT diam:     2.00 cm   LV E/e' lateral: 9.5 LV SV:         58 LV SV Index:   28 LVOT Area:     3.14 cm  RIGHT VENTRICLE             IVC RV Basal diam:  3.50 cm     IVC diam: 1.60 cm RV S prime:     15.20 cm/s TAPSE (M-mode): 2.0 cm LEFT ATRIUM             Index        RIGHT ATRIUM           Index LA diam:        4.30 cm 2.05 cm/m   RA Area:     11.90 cm LA Vol (A2C):   56.3 ml 26.88 ml/m  RA Volume:   24.50 ml  11.70 ml/m LA Vol (A4C):   40.0 ml 19.10 ml/m LA Biplane Vol: 46.2 ml 22.06 ml/m  AORTIC VALVE                    PULMONIC VALVE AV Area (Vmax):    2.65 cm     PV Vmax:       1.50 m/s AV Area (Vmean):   2.72 cm     PV Peak grad:  9.0 mmHg AV Area (VTI):     2.84 cm AV Vmax:           122.00 cm/s AV Vmean:          78.400 cm/s AV VTI:             0.206 m AV Peak Grad:      6.0 mmHg AV Mean Grad:      3.0 mmHg LVOT Vmax:         103.00 cm/s LVOT Vmean:        67.900 cm/s LVOT VTI:          0.186 m LVOT/AV VTI ratio: 0.90  AORTA Ao Root diam: 3.30 cm Ao Asc diam:  3.00 cm MITRAL VALVE MV Area (PHT): 5.13 cm    SHUNTS MV Decel Time: 148 msec    Systemic VTI:  0.19 m MV E velocity: 98.50 cm/s  Systemic Diam: 2.00 cm Dina Rich MD Electronically signed by Dina Rich MD Signature Date/Time: 08/29/2022/4:14:46 PM    Final    US Abdomen Limited RUQ (LIVER/GB)  Result  Date: 08/29/2022 CLINICAL DATA:  Cholelithiasis EXAM: ULTRASOUND ABDOMEN LIMITED RIGHT UPPER QUADRANT COMPARISON:  CT from earlier in the same day, ultrasound from 05/13/2021. FINDINGS: Gallbladder: Gallbladder is well distended. Positive sonographic Murphy's sign is noted. Wall thickening to 4.5 mm is seen. The dependent density seen on prior CT is not well appreciated on this exam. Common bile duct: Diameter: 3 mm Liver: 1.5 cm echogenic focus is noted in the right lobe of the liver consistent with hemangioma. This is stable from prior ultrasound. No other focal abnormality is noted portal vein is patent on color Doppler imaging with normal direction of blood flow towards the liver. Other: None. IMPRESSION: Stable hepatic hemangioma. Gallbladder wall thickening with positive sonographic Murphy's sign. These changes are consistent with cholecystitis in the appropriate clinical setting. The dependent density seen on prior CT is not well appreciated on this exam. Electronically Signed   By: Alcide Clever M.D.   On: 08/29/2022 10:15   CT Angio Chest Pulmonary Embolism (PE) W or WO Contrast  Result Date: 08/29/2022 CLINICAL DATA:  Pulmonary embolism suspected, high probability. EXAM: CT ANGIOGRAPHY CHEST WITH CONTRAST TECHNIQUE: Multidetector CT imaging of the chest was performed using the standard protocol during bolus administration of intravenous contrast. Multiplanar CT image  reconstructions and MIPs were obtained to evaluate the vascular anatomy. RADIATION DOSE REDUCTION: This exam was performed according to the departmental dose-optimization program which includes automated exposure control, adjustment of the mA and/or kV according to patient size and/or use of iterative reconstruction technique. CONTRAST:  75mL OMNIPAQUE IOHEXOL 350 MG/ML SOLN COMPARISON:  08/25/2021. FINDINGS: Cardiovascular: The heart is enlarged and there is a trace pericardial effusion. Three-vessel coronary artery calcifications are noted. There is atherosclerotic calcification of the aorta without evidence of aneurysm. The pulmonary trunk is normal in caliber. Small pulmonary artery filling defects are noted in the subsegmental arteries in the left lower lobe. Evaluation is slightly limited due to respiratory motion. Dilatation of the right ventricle is noted. Mediastinum/Nodes: No enlarged mediastinal, hilar, or axillary lymph nodes. Thyroid gland, trachea, and esophagus demonstrate no significant findings. Lungs/Pleura: There is a trace right pleural effusion with atelectasis at the lung bases. No pneumothorax. Upper Abdomen: Hyperdense material is present in the dependent portion of the urinary bladder, possible stones or sludge. There is gallbladder wall thickening with pericholecystic fat stranding. Musculoskeletal: Degenerative changes are present in the thoracic spine. Kyphoplasty changes are present at T12. No acute osseous abnormality. Review of the MIP images confirms the above findings. IMPRESSION: 1. Small subsegmental pulmonary emboli in the left lower lobe. There is dilatation of the right ventricle which is unchanged from the prior exam and may be related to right heart failure given small thrombus volume. Correlate clinically to exclude superimposed strain. 2. Trace right pleural effusion with atelectasis at the lung bases bilaterally. 3. Layering hyperdense material in the gallbladder, possible  stones or sludge, with gallbladder wall thickening and surrounding fat stranding, concerning for cholecystitis. Ultrasound is recommended for further evaluation. 4. Three-vessel coronary artery calcifications. 5. Aortic atherosclerosis. Critical Value/emergent results were called by telephone at the time of interpretation on 08/29/2022 at 3:44 am to provider TIMOTHY OPYD , who verbally acknowledged these results. Electronically Signed   By: Thornell Sartorius M.D.   On: 08/29/2022 03:44   DG Chest 2 View  Result Date: 08/28/2022 CLINICAL DATA:  Dyspnea EXAM: CHEST - 2 VIEW COMPARISON:  09/02/2021 chest radiograph. FINDINGS: Stable cardiomediastinal silhouette with normal heart size. No pneumothorax. No pleural effusion. No pulmonary  edema. Mild streaky bibasilar scarring versus atelectasis. No acute consolidative airspace disease. IMPRESSION: Mild streaky bibasilar scarring versus atelectasis. Otherwise no active cardiopulmonary disease. Electronically Signed   By: Delbert Phenix M.D.   On: 08/28/2022 16:59    Labs:  CBC: Recent Labs    08/28/22 1717 08/28/22 1727 08/29/22 0251 08/30/22 0632 08/31/22 0202  WBC 12.1*  --  11.8* 12.0* 13.9*  HGB 13.2 14.6 13.8 15.0 13.3  HCT 40.9 43.0 42.0 47.4 41.5  PLT 209  --  221 287 264    COAGS: Recent Labs    08/29/22 1140 08/29/22 2106 08/30/22 0632 08/30/22 1205 08/31/22 0202  INR  --   --   --  1.1  --   APTT 56* 60* 77*  --  59*    BMP: Recent Labs    08/28/22 1717 08/28/22 1727 08/29/22 0251 08/30/22 0632 08/31/22 0202  NA 133* 136 134* 137 136  K 4.4 4.4 4.1 4.5 3.8  CL 102  --  99 101 101  CO2 21*  --  21* 25 24  GLUCOSE 181*  --  183* 163* 123*  BUN 14  --  13 15 20   CALCIUM 8.5*  --  9.2 9.1 8.7*  CREATININE 0.80  --  0.87 0.92 1.09  GFRNONAA >60  --  >60 >60 >60    LIVER FUNCTION TESTS: Recent Labs    09/02/21 1541 12/30/21 0944 04/20/22 1145 04/22/22 1122 08/29/22 0251  BILITOT 0.4 0.5 0.4  --  0.4  AST 14 15 18   --   31  ALT 20 19 22   --  30  ALKPHOS  --  81 81 81 70  PROT 7.1 7.1 7.9  --  7.2  ALBUMIN  --  3.9 4.2  --  2.8*   Drain Location: RUQ Size: Fr size: 10 Fr Date of placement: 08/30/22  Currently to: Drain collection device: gravity 24 hour output:  Output by Drain (mL) 08/29/22 0701 - 08/29/22 1900 08/29/22 1901 - 08/30/22 0700 08/30/22 0701 - 08/30/22 1900 08/30/22 1901 - 08/31/22 0700 08/31/22 0701 - 08/31/22 1055  Biliary Tube Cook slip-coat 10.2 Fr. RUQ    125     Interval imaging/drain manipulation:  none  Current examination: Flushes/aspirates easily.  Insertion site unremarkable. Suture and stat lock in place. Dressed appropriately.   Plan: Continue TID flushes with 5 cc NS. Record output Q shift. Dressing changes QD or PRN if soiled.  Call IR APP or on call IR MD if difficulty flushing or sudden change in drain output.   Pt should flush drain daily 5 cc sterile saline Please give Rx for home He will hear from OP IR scheduler for follow up appt date and time   IR will continue to follow - please call with questions or concerns.  Electronically Signed: Robet Leu, [name] 08/31/2022, 10:54 AM   I spent a total of 15 Minutes at the the patient's bedside AND on the patient's hospital floor or unit, greater than 50% of which was counseling/coordinating care for perc chole drain

## 2022-08-31 NOTE — Plan of Care (Signed)

## 2022-08-31 NOTE — Progress Notes (Signed)
ANTICOAGULATION CONSULT NOTE  Pharmacy Consult for heparin Indication:  acute PE despite Xarelto PTA for PAF  Allergies  Allergen Reactions   Naproxen Other (See Comments)    (Naprosyn *ANALGESICS - ANTI-INFLAMMATORY*) Nausea, Abdominal pain   Silodosin Other (See Comments)    Low blood pressure    Patient Measurements: Height: 5\' 11"  (180.3 cm) Weight: 89 kg (196 lb 1.6 oz) IBW/kg (Calculated) : 75.3  Vital Signs: Temp: 98 F (36.7 C) (06/11 0834) Temp Source: Oral (06/11 0834) BP: 121/63 (06/11 1002) Pulse Rate: 68 (06/11 1002)  Labs: Recent Labs    08/29/22 0251 08/29/22 1140 08/29/22 2106 08/30/22 0632 08/30/22 1205 08/31/22 0202 08/31/22 1047  HGB 13.8  --   --  15.0  --  13.3  --   HCT 42.0  --   --  47.4  --  41.5  --   PLT 221  --   --  287  --  264  --   APTT  --  56*   < > 77*  --  59* 88*  LABPROT  --   --   --   --  14.5  --   --   INR  --   --   --   --  1.1  --   --   HEPARINUNFRC  --  0.65  --  0.92*  --  0.58  --   CREATININE 0.87  --   --  0.92  --  1.09  --    < > = values in this interval not displayed.     Estimated Creatinine Clearance: 65.2 mL/min (by C-G formula based on SCr of 1.09 mg/dL).   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 M75.00)   Allergic rhinitis due to pollen 09/03/2013   J30.1)  Formatting of this note might be different from the original. J30.1)   Allergy    Anticoagulated 07/01/2016   Anxiety    Arthritis    Atrial fibrillation (HCC)    Atrial flutter (HCC) 06/26/2015   CAD (coronary artery disease)    Chest pain 06/26/2015   COPD (chronic obstructive pulmonary disease) (HCC)    Coronary artery disease 07/06/2018   Cardiac catheterization 2017 showing 90% small diagonal branch disease   DDD  (degenerative disc disease), cervical 09/03/2013   M50.90)  Formatting of this note might be different from the original. M50.90)   Depression    Depression    Depressive disorder 09/03/2013   Dizziness 10/28/2017   Dyslipidemia, goal LDL below 70 07/06/2018   Dyspnea on exertion 10/20/2018   Esophageal reflux 09/03/2013   Essential hypertension 07/06/2018   ETOH abuse 01/11/2019   6 pack of beer per day   Falls 10/28/2017   GERD (gastroesophageal reflux disease)    H/O amiodarone therapy 07/01/2016   Heart attack (HCC)    Heart palpitations 01/27/2017   History of colon polyps    Hyperlipidemia    Hypertension    IPF (idiopathic pulmonary fibrosis) (HCC)    Kidney stones    Neck pain 09/03/2013   Neuropathy 07/06/2018   Obstructive sleep apnea 08/05/2015   PLMD (periodic limb movement disorder) 11/04/2015   Polycythemia, 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 STORY: Due to alcohol/ tobacco   Pure hypercholesterolemia 09/03/2013   E78.0)  Formatting of this note might be different from the original. E78.0)   Screening for prostate cancer 09/03/2013   Smoker 01/11/2019   Smoking 07/06/2018   Status post ablation of atrial flutter 07/06/2018   2017    Assessment: 72yo male c/o SOB x2-3wk, CT reveals small subsegmental PE despite Xarelto therapy PTA for PAF. Last dose of Xarelto reported to be taken 6/7 at 1700. From review of pharmacy records, it appears good compliance with Xarelto.  Pharmacy consulted to transition to heparin.  aPTT came back therapeutic at 88, on 2000 units/hr. No s/sx of bleeding or infusion issues.  Goal of Therapy:  Heparin level 0.3-0.7 units/ml aPTT 66-102 seconds Monitor platelets by anticoagulation protocol: Yes   Plan:  Continue IV heparin at 2000 units/hr. Will f/u aPTT and heparin level in 8 hours F/u ability to transition back to DOAC  Thank you for allowing  pharmacy to participate in this patient's care,  Sherron Monday, PharmD, BCCCP Clinical Pharmacist  Phone: [phone] 08/31/2022 12:18 PM  Please check AMION for all [facility] Pharmacy phone numbers After 10:00 PM, call Main Pharmacy [phone]

## 2022-08-31 NOTE — Progress Notes (Signed)
PROGRESS NOTE    Robert Lang  VWU:[id number] DOB: 1950/07/22 DOA: 08/28/2022 PCP: [name], [name]  Outpatient Specialists:     Brief Narrative:  Patient is a 72 year old male with past medical history significant for COPD, IPF, OSA, depression, anxiety, coronary artery disease, paroxysmal atrial fibrillation on Xarelto.  Patient was admitted with worsening shortness of breath and fatigue.  Worsening shortness of breath improved with DuoNeb and steroids.  CT of the chest done as part of patient's workup revealed a small subsegmental pulmonary embolism.  Patient was on Xarelto prior to admission.  Patient is currently on heparin drip.  CT scan of the chest also revealed possible cholecystitis.  Right upper quadrant ultrasound is also suggestive of cholecystitis.  Surgical team has been consulted.  Patient has undergone cholecystostomy.  Patient is currently on IV Zosyn.  For interval cholecystectomy.  As per pulmonary team, patient will need clearance in 6 to 8 weeks prior to surgery.  Patient will be discharged back on DOAC.    Assessment & Plan:   Principal Problem:   Acute respiratory failure with hypoxemia (HCC) Active Problems:   Coronary artery disease   COPD (chronic obstructive pulmonary disease) (HCC)   Depressive disorder   CAD (coronary artery disease)   Atrial fibrillation (HCC)   Anxiety   IPF (idiopathic pulmonary fibrosis) (HCC)   OSA (obstructive sleep apnea)   COPD with exacerbation (HCC)   1. Acute hypoxic respiratory failure; COPD; IPF; OSA  -Suspect secondary to COPD exacerbation. -Improved with DuoNebs and steroids. -CTA chest revealed small subsegmental pulmonary embolism. -Patient was on Xarelto prior to admission. -Patient is currently on heparin drip. -Pulmonary team consulted.  Patient will be discharged back on DOAC..    2. CAD  - No anginal complaints  - Continue Lipitor, Ranexa     3. PAF  -Xarelto is on hold. -Patient is currently on  heparin drip.      4. Depression, anxiety   - Continue Wellbutrin, Buspar, Zoloft, trazodone    Acute cholecystitis: -Recent history of fever, chills, nausea and vomiting. -CTA chest revealed likely cholecystitis. -Right upper quadrant ultrasound was suggestive of likely cholecystitis. -IV cefepime has been changed to IV Zosyn. -Surgical team has been consulted.  Surgery team is directing care.  08/30/2022: Status post cholecystostomy by the interventional radiology team.  Continue IV antibiotics.  For interval cholecystectomy.   DVT prophylaxis: Heparin drip. Code Status: Full code. Family Communication: Wife. Disposition Plan: Home eventually   Consultants:  Surgery team. Pulmonary team Interventional radiology  Procedures:  Cholecystostomy is planned.  Antimicrobials:  IV cefepime has been discontinued. IV Zosyn.   Subjective: No shortness of breath. No fever or chills.   Objective: Vitals:   08/31/22 0834 08/31/22 1000 08/31/22 1002 08/31/22 1045  BP: 96/73  121/63   Pulse: 87  68   Resp: 16 18 16    Temp: 98 F (36.7 C)     TempSrc: Oral     SpO2: 94%  93% 94%  Weight:      Height:        Intake/Output Summary (Last 24 hours) at 08/31/2022 1445 Last data filed at 08/31/2022 1421 Gross per 24 hour  Intake 2409.23 ml  Output 2050 ml  Net 359.23 ml    Filed Weights   08/29/22 0014 08/30/22 0352 08/31/22 0526  Weight: 89.3 kg 87.9 kg 89 kg    Examination:  General exam: Appears calm and comfortable.  Patient is obese. Respiratory system: Decreased air entry. Cardiovascular  system: S1 & S2 heard Gastrointestinal system: Abdomen is obese.  Right upper quadrant tenderness on palpation.  Positive Murphy sign. Central nervous system: Alert and oriented.  Patient moves all extremities. Extremities: No leg edema.  Data Reviewed: I have personally reviewed following labs and imaging studies  CBC: Recent Labs  Lab 08/28/22 1717 08/28/22 1727  08/29/22 0251 08/30/22 0632 08/31/22 0202  WBC 12.1*  --  11.8* 12.0* 13.9*  NEUTROABS 10.6*  --   --   --   --   HGB 13.2 14.6 13.8 15.0 13.3  HCT 40.9 43.0 42.0 47.4 41.5  MCV 95.6  --  96.3 98.8 97.0  PLT 209  --  221 287 264    Basic Metabolic Panel: Recent Labs  Lab 08/28/22 1717 08/28/22 1727 08/29/22 0251 08/30/22 0632 08/31/22 0202  NA 133* 136 134* 137 136  K 4.4 4.4 4.1 4.5 3.8  CL 102  --  99 101 101  CO2 21*  --  21* 25 24  GLUCOSE 181*  --  183* 163* 123*  BUN 14  --  13 15 20   CREATININE 0.80  --  0.87 0.92 1.09  CALCIUM 8.5*  --  9.2 9.1 8.7*  MG  --   --  2.2  --   --     GFR: Estimated Creatinine Clearance: 65.2 mL/min (by C-G formula based on SCr of 1.09 mg/dL). Liver Function Tests: Recent Labs  Lab 08/29/22 0251  AST 31  ALT 30  ALKPHOS 70  BILITOT 0.4  PROT 7.2  ALBUMIN 2.8*    Recent Labs  Lab 08/29/22 0251  LIPASE 47    No results for input(s): "AMMONIA" in the last 168 hours. Coagulation Profile: Recent Labs  Lab 08/30/22 1205  INR 1.1    Cardiac Enzymes: No results for input(s): "CKTOTAL", "CKMB", "CKMBINDEX", "TROPONINI" in the last 168 hours. BNP (last 3 results) No results for input(s): "PROBNP" in the last 8760 hours. HbA1C: No results for input(s): "HGBA1C" in the last 72 hours. CBG: No results for input(s): "GLUCAP" in the last 168 hours. Lipid Profile: No results for input(s): "CHOL", "HDL", "LDLCALC", "TRIG", "CHOLHDL", "LDLDIRECT" in the last 72 hours. Thyroid Function Tests: No results for input(s): "TSH", "T4TOTAL", "FREET4", "T3FREE", "THYROIDAB" in the last 72 hours. Anemia Panel: No results for input(s): "VITAMINB12", "FOLATE", "FERRITIN", "TIBC", "IRON", "RETICCTPCT" in the last 72 hours. Urine analysis:    Component Value Date/Time   COLORURINE YELLOW 08/27/2021 0245   APPEARANCEUR CLEAR 08/27/2021 0245   LABSPEC 1.014 08/27/2021 0245   PHURINE 7.0 08/27/2021 0245   GLUCOSEU NEGATIVE 08/27/2021  0245   HGBUR NEGATIVE 08/27/2021 0245   BILIRUBINUR NEGATIVE 08/27/2021 0245   BILIRUBINUR Negative 09/06/2018 1023   KETONESUR NEGATIVE 08/27/2021 0245   PROTEINUR NEGATIVE 08/27/2021 0245   UROBILINOGEN negative (A) 09/06/2018 1023   NITRITE NEGATIVE 08/27/2021 0245   LEUKOCYTESUR NEGATIVE 08/27/2021 0245   Sepsis Labs: @LABRCNTIP (procalcitonin:4,lacticidven:4)  ) Recent Results (from the past 240 hour(s))  Expectorated Sputum Assessment w Gram Stain, Rflx to Resp Cult     Status: None   Collection Time: 08/29/22  1:00 PM   Specimen: Expectorated Sputum  Result Value Ref Range Status   Specimen Description EXPECTORATED SPUTUM  Final   Special Requests NONE  Final   Sputum evaluation   Final    THIS SPECIMEN IS ACCEPTABLE FOR SPUTUM CULTURE Performed at [facility], [address], [place], [place] [address]    Report Status 08/29/2022 FINAL  Final  Culture, Respiratory w Gram Stain     Status: None   Collection Time: 08/29/22  1:00 PM  Result Value Ref Range Status   Specimen Description EXPECTORATED SPUTUM  Final   Special Requests NONE Reflexed from [id number]  Final   Gram Stain   Final    FEW SQUAMOUS EPITHELIAL CELLS PRESENT MODERATE WBC PRESENT, PREDOMINANTLY PMN FEW GRAM POSITIVE COCCI FEW GRAM NEGATIVE RODS FEW GRAM NEGATIVE DIPLOCOCCI FEW GRAM POSITIVE RODS    Culture   Final    RARE Normal respiratory flora-no Staph aureus or Pseudomonas seen Performed at [facility], [address] [place], [place] [address]    Report Status 08/31/2022 FINAL  Final  Aerobic/Anaerobic Culture w Gram Stain (surgical/deep wound)     Status: None (Preliminary result)   Collection Time: 08/30/22  4:54 PM   Specimen: Gallbladder; Bile  Result Value Ref Range Status   Specimen Description GALL BLADDER  Final   Special Requests BILE  Final   Gram Stain   Final    RARE WBC PRESENT, PREDOMINANTLY PMN NO ORGANISMS SEEN    Culture   Final    NO GROWTH < 24  HOURS Performed at [facility], [address], [place], [place] [address]    Report Status PENDING  Incomplete          Radiology Studies: IR Perc Cholecystostomy  Result Date: 08/31/2022 INDICATION: 73 year old male referred for percutaneous cholecystostomy EXAM: CHOLECYSTOSTOMY MEDICATIONS: None ANESTHESIA/SEDATION: Moderate (conscious) sedation was employed during this procedure. A total of Versed 1.0 mg and Fentanyl 75 mcg was administered intravenously. Moderate Sedation Time: 17 minutes. The patient's level of consciousness and vital signs were monitored continuously by radiology nursing throughout the procedure under my direct supervision. FLUOROSCOPY TIME:  Fluoroscopy Time: 3 mGy). COMPLICATIONS: None PROCEDURE: Informed written consent was obtained from the patient and the patient's family after a thorough discussion of the procedural risks, benefits and alternatives. All questions were addressed. Maximal Sterile Barrier Technique was utilized including caps, mask, sterile gowns, sterile gloves, sterile drape, hand hygiene and skin antiseptic. A timeout was performed prior to the initiation of the procedure. Ultrasound survey of the right upper quadrant was performed for planning purposes. Once the patient is prepped and draped in the usual sterile fashion, the skin and subcutaneous tissues overlying the gallbladder were generously infiltrated 1% lidocaine for local anesthesia. A coaxial needle was advanced under ultrasound guidance through the skin subcutaneous tissues and a small segment of liver into the gallbladder lumen. With removal of the stylet, spontaneous dark bile drainage occurred. Using modified Seldinger technique, a 10 French drain was placed into the gallbladder fossa, with aspiration of the sample for the lab. Contrast injection confirmed position of the tube within the gallbladder lumen. Drainage catheter was attached to gravity drain with a suture retention placed.  Patient tolerated the procedure well and remained hemodynamically stable throughout. No complications were encountered and no significant blood loss encountered. IMPRESSION: Status post image guided percutaneous cholecystostomy Signed, Yvone Neu. Miachel Roux, RPVI [facility] Radiology Electronically Signed   By: Gilmer Mor D.O.   On: 08/31/2022 08:28   ECHOCARDIOGRAM COMPLETE  Result Date: 08/29/2022    ECHOCARDIOGRAM REPORT   Patient Name:   Robert Lang Date of Exam: 08/29/2022 Medical Rec #:  [id number]         Height:       71.0 in Accession #:    [id number]  Weight:       196.8 lb Date of Birth:  [birth year]          BSA:          2.094 m Patient Age:    72 years          BP:           163/87 mmHg Patient Gender: M                 HR:           97 bpm. Exam Location:  Inpatient Procedure: 2D Echo, Cardiac Doppler and Color Doppler Indications:    Pulmonary Embolus I26.09  History:        Patient has prior history of Echocardiogram examinations, most                 recent 05/19/2022. CAD and Previous Myocardial Infarction, COPD                 and ETOH abuse; Risk Factors:Sleep Apnea, Dyslipidemia, Former                 Smoker and Hypertension.  Sonographer:    Dondra Prader RVT RCS Referring Phys: [id number] TIMOTHY S OPYD  Sonographer Comments: Suboptimal apical window, Technically challenging study due to limited acoustic windows and Technically difficult study due to poor echo windows. Declined Definity IMPRESSIONS  1. Left ventricular ejection fraction, by estimation, is 60 to 65%. The left ventricle has normal function. Left ventricular endocardial border not optimally defined to evaluate regional wall motion. Left ventricular diastolic parameters are indeterminate.  2. Right ventricular systolic function is normal. The right ventricular size is normal. Tricuspid regurgitation signal is inadequate for assessing PA pressure.  3. The mitral valve  is normal in structure. No evidence of mitral valve regurgitation. No evidence of mitral stenosis.  4. The aortic valve is tricuspid. There is mild calcification of the aortic valve. There is mild thickening of the aortic valve. Aortic valve regurgitation is not visualized. No aortic stenosis is present.  5. The inferior vena cava is normal in size with greater than 50% respiratory variability, suggesting right atrial pressure of 3 mmHg. FINDINGS  Left Ventricle: Left ventricular ejection fraction, by estimation, is 60 to 65%. The left ventricle has normal function. Left ventricular endocardial border not optimally defined to evaluate regional wall motion. The left ventricular internal cavity size was normal in size. There is no left ventricular hypertrophy. Left ventricular diastolic parameters are indeterminate. Right Ventricle: The right ventricular size is normal. Right vetricular wall thickness was not well visualized. Right ventricular systolic function is normal. Tricuspid regurgitation signal is inadequate for assessing PA pressure. Left Atrium: Left atrial size was normal in size. Right Atrium: Right atrial size was normal in size. Pericardium: There is no evidence of pericardial effusion. Mitral Valve: The mitral valve is normal in structure. No evidence of mitral valve regurgitation. No evidence of mitral valve stenosis. Tricuspid Valve: The tricuspid valve is normal in structure. Tricuspid valve regurgitation is not demonstrated. No evidence of tricuspid stenosis. Aortic Valve: The aortic valve is tricuspid. There is mild calcification of the aortic valve. There is mild thickening of the aortic valve. There is mild aortic valve annular calcification. Aortic valve regurgitation is not visualized. No aortic stenosis  is present. Aortic valve mean gradient measures 3.0 mmHg. Aortic valve peak gradient measures 6.0 mmHg. Aortic valve area, by VTI measures 2.84 cm. Pulmonic Valve: The pulmonic valve was not  well visualized. Pulmonic valve regurgitation is not visualized. No evidence of pulmonic stenosis. Aorta: The aortic root is normal in size and structure. Venous: The inferior vena cava is normal in size with greater than 50% respiratory variability, suggesting right atrial pressure of 3 mmHg. IAS/Shunts: No atrial level shunt detected by color flow Doppler.  LEFT VENTRICLE PLAX 2D LVIDd:         5.00 cm   Diastology LVIDs:         3.50 cm   LV e' medial:    6.96 cm/s LV PW:         1.00 cm   LV E/e' medial:  14.2 LV IVS:        1.00 cm   LV e' lateral:   10.40 cm/s LVOT diam:     2.00 cm   LV E/e' lateral: 9.5 LV SV:         58 LV SV Index:   28 LVOT Area:     3.14 cm  RIGHT VENTRICLE             IVC RV Basal diam:  3.50 cm     IVC diam: 1.60 cm RV S prime:     15.20 cm/s TAPSE (M-mode): 2.0 cm LEFT ATRIUM             Index        RIGHT ATRIUM           Index LA diam:        4.30 cm 2.05 cm/m   RA Area:     11.90 cm LA Vol (A2C):   56.3 ml 26.88 ml/m  RA Volume:   24.50 ml  11.70 ml/m LA Vol (A4C):   40.0 ml 19.10 ml/m LA Biplane Vol: 46.2 ml 22.06 ml/m  AORTIC VALVE                    PULMONIC VALVE AV Area (Vmax):    2.65 cm     PV Vmax:       1.50 m/s AV Area (Vmean):   2.72 cm     PV Peak grad:  9.0 mmHg AV Area (VTI):     2.84 cm AV Vmax:           122.00 cm/s AV Vmean:          78.400 cm/s AV VTI:            0.206 m AV Peak Grad:      6.0 mmHg AV Mean Grad:      3.0 mmHg LVOT Vmax:         103.00 cm/s LVOT Vmean:        67.900 cm/s LVOT VTI:          0.186 m LVOT/AV VTI ratio: 0.90  AORTA Ao Root diam: 3.30 cm Ao Asc diam:  3.00 cm MITRAL VALVE MV Area (PHT): 5.13 cm    SHUNTS MV Decel Time: 148 msec    Systemic VTI:  0.19 m MV E velocity: 98.50 cm/s  Systemic Diam: 2.00 cm Dina Rich MD Electronically signed by Dina Rich MD Signature Date/Time: 08/29/2022/4:14:46 PM    Final         Scheduled Meds:  atorvastatin  80 mg Oral Daily   buPROPion  300 mg Oral Daily   busPIRone  15 mg  Oral TID   finasteride  5 mg Oral Daily   lamoTRIgine  25 mg Oral Daily   Followed  by   [START ON 09/05/2022] lamoTRIgine  50 mg Oral Daily   pantoprazole  40 mg Oral Daily   Pirfenidone  801 mg Oral TID   predniSONE  40 mg Oral Q breakfast   pregabalin  150 mg Oral BID   ranolazine  1,000 mg Oral BID   sodium chloride flush  3 mL Intravenous Q12H   sodium chloride flush  5 mL Intracatheter Q8H   umeclidinium-vilanterol  1 puff Inhalation Daily   Continuous Infusions:  sodium chloride 10 mL/hr at 08/29/22 0337   sodium chloride 50 mL/hr at 08/30/22 0120   heparin 2,000 Units/hr (08/31/22 0513)   piperacillin-tazobactam (ZOSYN)  IV 3.375 g (08/31/22 0754)     LOS: 2 days    Time spent: 35 minutes.    Berton Mount, MD  [facility] Pager #: [phone] 7PM-7AM contact night coverage as above

## 2022-09-01 ENCOUNTER — Inpatient Hospital Stay (HOSPITAL_COMMUNITY): Payer: Medicare Other

## 2022-09-01 ENCOUNTER — Other Ambulatory Visit (HOSPITAL_COMMUNITY): Payer: Self-pay

## 2022-09-01 DIAGNOSIS — J9601 Acute respiratory failure with hypoxia: Secondary | ICD-10-CM | POA: Diagnosis not present

## 2022-09-01 DIAGNOSIS — Z86711 Personal history of pulmonary embolism: Secondary | ICD-10-CM | POA: Diagnosis not present

## 2022-09-01 LAB — APTT: aPTT: 137 seconds — ABNORMAL HIGH (ref 24–36)

## 2022-09-01 LAB — CBC
HCT: 44.8 % (ref 39.0–52.0)
Hemoglobin: 14.4 g/dL (ref 13.0–17.0)
MCH: 31.7 pg (ref 26.0–34.0)
MCHC: 32.1 g/dL (ref 30.0–36.0)
MCV: 98.7 fL (ref 80.0–100.0)
Platelets: 283 10*3/uL (ref 150–400)
RBC: 4.54 MIL/uL (ref 4.22–5.81)
RDW: 13.5 % (ref 11.5–15.5)
WBC: 7.8 10*3/uL (ref 4.0–10.5)
nRBC: 0 % (ref 0.0–0.2)

## 2022-09-01 LAB — HEPARIN LEVEL (UNFRACTIONATED): Heparin Unfractionated: >1.1 IU/mL — ABNORMAL HIGH (ref 0.30–0.70)

## 2022-09-01 MED ORDER — NORMAL SALINE FLUSH 0.9 % IV SOLN
5.0000 mL | Freq: Every day | INTRAVENOUS | 0 refills | Status: DC
  Filled 2022-09-01: qty 200, 20d supply, fill #0

## 2022-09-01 MED ORDER — AMOXICILLIN-POT CLAVULANATE 875-125 MG PO TABS
1.0000 | ORAL_TABLET | Freq: Two times a day (BID) | ORAL | Status: DC
  Administered 2022-09-01: 1 via ORAL
  Filled 2022-09-01 (×2): qty 1

## 2022-09-01 MED ORDER — APIXABAN 5 MG PO TABS: 5.0000 mg | ORAL_TABLET | Freq: Two times a day (BID) | ORAL | Status: DC

## 2022-09-01 MED ORDER — PREDNISONE 20 MG PO TABS
40.0000 mg | ORAL_TABLET | Freq: Every day | ORAL | 0 refills | Status: AC
  Filled 2022-09-01: qty 4, 2d supply, fill #0

## 2022-09-01 MED ORDER — UMECLIDINIUM-VILANTEROL 62.5-25 MCG/ACT IN AEPB
1.0000 | INHALATION_SPRAY | Freq: Every day | RESPIRATORY_TRACT | 0 refills | Status: DC
  Filled 2022-09-01: qty 60, 30d supply, fill #0

## 2022-09-01 MED ORDER — APIXABAN 5 MG PO TABS
5.0000 mg | ORAL_TABLET | Freq: Two times a day (BID) | ORAL | 0 refills | Status: DC
  Filled 2022-09-01: qty 60, 30d supply, fill #0

## 2022-09-01 MED ORDER — AMOXICILLIN-POT CLAVULANATE 875-125 MG PO TABS
1.0000 | ORAL_TABLET | Freq: Two times a day (BID) | ORAL | 0 refills | Status: AC
  Filled 2022-09-01: qty 10, 5d supply, fill #0

## 2022-09-01 MED ORDER — APIXABAN (ELIQUIS) VTE STARTER PACK (10MG AND 5MG)
10.0000 mg | ORAL_TABLET | Freq: Two times a day (BID) | ORAL | 0 refills | Status: DC
  Filled 2022-09-01: qty 74, 30d supply, fill #0

## 2022-09-01 MED ORDER — APIXABAN 5 MG PO TABS
10.0000 mg | ORAL_TABLET | Freq: Two times a day (BID) | ORAL | Status: DC
  Administered 2022-09-01: 10 mg via ORAL
  Filled 2022-09-01 (×2): qty 2

## 2022-09-01 NOTE — Discharge Summary (Addendum)
Physician Discharge Summary  Robert Lang ZOX:[id number] DOB: 01-21-1951 DOA: 08/28/2022  PCP: [name], [name]  Admit date: 08/28/2022 Discharge date: 09/01/2022  Admitted From: Home Disposition:  Home  Discharge Condition:Stable CODE STATUS:FULL Diet recommendation:  Regular  Brief/Interim Summary: Patient is a 72 year old male with past medical history significant for COPD, IPF, OSA, depression, anxiety, coronary artery disease, paroxysmal atrial fibrillation on Xarelto.  Patient was admitted with worsening shortness of breath and fatigue.  Worsening shortness of breath improved with DuoNeb and steroids.  CT of the chest done as part of patient's workup revealed a small subsegmental pulmonary embolism.  Patient was on Xarelto prior to admission.  Patient was started  on heparin drip.  CT scan of the chest also revealed possible cholecystitis.  Right upper quadrant ultrasound is also suggestive of cholecystitis.  Surgical team was  consulted.  Patient has undergone cholecystostomy.  General surgery planning to follow-up as an outpatient as well as IR.  He remains very comfortable today without any abdomen pain, nausea or vomiting.  He will be discharged with right upper quadrant drain.  He has been switched to Eliquis on discharge.  Medically stable for discharge  Following problems were addressed during the hospitalization:   Acute hypoxic respiratory failure; COPD; IPF; OSA  -Suspect secondary to COPD exacerbation. -Improved with DuoNebs and steroids. -CTA chest revealed small subsegmental pulmonary embolism. -Patient was on Xarelto prior to admission. -Patient started on heparin drip,now switched to eliquis. -Remains on room air today.  Lungs are clear on auscultation.  He will follow-up with pulmonology as an outpatient   2. CAD  - No anginal complaints  - Continue Lipitor, Ranexa     3. PAF  -Currently normal sinus rhythm.  On Eliquis.  4. Depression, anxiety   - Continue  Wellbutrin, Buspar, Zoloft, trazodone     5.Acute cholecystitis: -Recent history of fever, chills, nausea and vomiting. -CTA chest revealed likely cholecystitis. -Right upper quadrant ultrasound was suggestive of likely cholecystitis. -08/30/2022: Status post cholecystostomy by the interventional radiology team.  He will follow-up with general surgery as an outpatient for interval cholecystectomy.  He will be discharged with right upper quadrant drain.  Antibiotics changed to oral.  Discharge Diagnoses:  Principal Problem:   Acute respiratory failure with hypoxemia (HCC) Active Problems:   Coronary artery disease   IPF (idiopathic pulmonary fibrosis) (HCC)   COPD (chronic obstructive pulmonary disease) (HCC)   Depressive disorder   CAD (coronary artery disease)   Atrial fibrillation (HCC)   Anxiety   OSA (obstructive sleep apnea)   COPD with exacerbation (HCC)   Single subsegmental pulmonary embolism without acute cor pulmonale (HCC)   COPD exacerbation (HCC)    Discharge Instructions  Discharge Instructions     Diet general   Complete by: As directed    Discharge instructions   Complete by: As directed    1)Please take prescribed medications as instructed 2)Follow up with your PCP in a week 3)Follow up with general surgery and interventional radiology as an outpatient.  Name and number of  the providers has been attached   Increase activity slowly   Complete by: As directed    No wound care   Complete by: As directed          Follow-up Information     Gilmer Mor, DO Follow up in 7 week(s).   Specialties: Interventional Radiology, Radiology Why: pt will hear from IR scheduler for 6-8 week follow up; flush daily 5-10 cc sterile saline; call  [phone] if any questions/concerns Contact information: [address] [place] [address] [phone]         Kinsinger, De Blanch, MD. Go on 10/13/2022.   Specialty: General Surgery Why: 9 AM, please  arrive 30 min prior to appointment time Contact information: [address] [place] [address] [phone]                Allergies  Allergen Reactions   Naproxen Other (See Comments)    (Naprosyn *ANALGESICS - ANTI-INFLAMMATORY*) Nausea, Abdominal pain   Silodosin Other (See Comments)    Low blood pressure    Consultations: Surgery,IR,pulmonology   Procedures/Studies: VAS [place] LOWER EXTREMITY VENOUS (DVT)  Result Date: 09/01/2022  Lower Venous DVT Study Patient Name:  Robert Lang  Date of Exam:   09/01/2022 Medical Rec #: [id number]          Accession #:    [id number] Date of Birth: [birth year]           Patient Gender: M Patient Age:   33 years Exam Location:  [facility] Procedure:      VAS [place] LOWER EXTREMITY VENOUS (DVT) Referring Phys: Pia Mau --------------------------------------------------------------------------------  Indications: Pulmonary embolism. Other Indications: COPD, IPF. Anticoagulation: Xarelto. Comparison Study: No priors Performing Technologist: Mora Sink Sturdivant RDMS, RVT  Examination Guidelines: A complete evaluation includes B-mode imaging, spectral Doppler, color Doppler, and power Doppler as needed of all accessible portions of each vessel. Bilateral testing is considered an integral part of a complete examination. Limited examinations for reoccurring indications may be performed as noted. The reflux portion of the exam is performed with the patient in reverse Trendelenburg.  +---------+---------------+---------+-----------+----------+--------------+ RIGHT    CompressibilityPhasicitySpontaneityPropertiesThrombus Aging +---------+---------------+---------+-----------+----------+--------------+ CFV      Full           Yes      Yes                                 +---------+---------------+---------+-----------+----------+--------------+ SFJ      Full                                                         +---------+---------------+---------+-----------+----------+--------------+ FV Prox  Full                                                        +---------+---------------+---------+-----------+----------+--------------+ FV Mid   Full                                                        +---------+---------------+---------+-----------+----------+--------------+ FV DistalFull                                                        +---------+---------------+---------+-----------+----------+--------------+ PFV  Full                                                        +---------+---------------+---------+-----------+----------+--------------+ POP      Full           Yes      Yes                                 +---------+---------------+---------+-----------+----------+--------------+ PTV      Full                                                        +---------+---------------+---------+-----------+----------+--------------+ PERO     Full                                                        +---------+---------------+---------+-----------+----------+--------------+   +---------+---------------+---------+-----------+----------+--------------+ LEFT     CompressibilityPhasicitySpontaneityPropertiesThrombus Aging +---------+---------------+---------+-----------+----------+--------------+ CFV      Full           Yes      Yes                                 +---------+---------------+---------+-----------+----------+--------------+ SFJ      Full                                                        +---------+---------------+---------+-----------+----------+--------------+ FV Prox  Full                                                        +---------+---------------+---------+-----------+----------+--------------+ FV Mid   Full                                                         +---------+---------------+---------+-----------+----------+--------------+ FV DistalFull                                                        +---------+---------------+---------+-----------+----------+--------------+ PFV      Full                                                        +---------+---------------+---------+-----------+----------+--------------+  POP      Full           Yes      Yes                                 +---------+---------------+---------+-----------+----------+--------------+ PTV      Full                                                        +---------+---------------+---------+-----------+----------+--------------+ PERO     Full                                                        +---------+---------------+---------+-----------+----------+--------------+    Summary: BILATERAL: - No evidence of deep vein thrombosis seen in the lower extremities, bilaterally. -No evidence of popliteal cyst, bilaterally.   *See table(s) above for measurements and observations.    Preliminary    IR Perc Cholecystostomy  Result Date: 08/31/2022 INDICATION: 72 year old male referred for percutaneous cholecystostomy EXAM: CHOLECYSTOSTOMY MEDICATIONS: None ANESTHESIA/SEDATION: Moderate (conscious) sedation was employed during this procedure. A total of Versed 1.0 mg and Fentanyl 75 mcg was administered intravenously. Moderate Sedation Time: 17 minutes. The patient's level of consciousness and vital signs were monitored continuously by radiology nursing throughout the procedure under my direct supervision. FLUOROSCOPY TIME:  Fluoroscopy Time: 3 mGy). COMPLICATIONS: None PROCEDURE: Informed written consent was obtained from the patient and the patient's family after a thorough discussion of the procedural risks, benefits and alternatives. All questions were addressed. Maximal Sterile Barrier Technique was utilized including caps, mask, sterile gowns, sterile gloves,  sterile drape, hand hygiene and skin antiseptic. A timeout was performed prior to the initiation of the procedure. Ultrasound survey of the right upper quadrant was performed for planning purposes. Once the patient is prepped and draped in the usual sterile fashion, the skin and subcutaneous tissues overlying the gallbladder were generously infiltrated 1% lidocaine for local anesthesia. A coaxial needle was advanced under ultrasound guidance through the skin subcutaneous tissues and a small segment of liver into the gallbladder lumen. With removal of the stylet, spontaneous dark bile drainage occurred. Using modified Seldinger technique, a 10 French drain was placed into the gallbladder fossa, with aspiration of the sample for the lab. Contrast injection confirmed position of the tube within the gallbladder lumen. Drainage catheter was attached to gravity drain with a suture retention placed. Patient tolerated the procedure well and remained hemodynamically stable throughout. No complications were encountered and no significant blood loss encountered. IMPRESSION: Status post image guided percutaneous cholecystostomy Signed, Yvone Neu. Miachel Roux, RPVI [facility] Electronically Signed   By: Gilmer Mor D.O.   On: 08/31/2022 08:28   ECHOCARDIOGRAM COMPLETE  Result Date: 08/29/2022    ECHOCARDIOGRAM REPORT   Patient Name:   Robert Lang Date of Exam: 08/29/2022 Medical Rec #:  [id number]         Height:       71.0 in Accession #:    [id number]        Weight:       196.8 lb Date  of Birth:  January 13, 1951          BSA:          2.094 m Patient Age:    72 years          BP:           163/87 mmHg Patient Gender: M                 HR:           97 bpm. Exam Location:  Inpatient Procedure: 2D Echo, Cardiac Doppler and Color Doppler Indications:    Pulmonary Embolus I26.09  History:        Patient has prior history of Echocardiogram examinations, most                  recent 05/19/2022. CAD and Previous Myocardial Infarction, COPD                 and ETOH abuse; Risk Factors:Sleep Apnea, Dyslipidemia, Former                 Smoker and Hypertension.  Sonographer:    Dondra Prader RVT RCS Referring Phys: [id number] TIMOTHY S OPYD  Sonographer Comments: Suboptimal apical window, Technically challenging study due to limited acoustic windows and Technically difficult study due to poor echo windows. Declined Definity IMPRESSIONS  1. Left ventricular ejection fraction, by estimation, is 60 to 65%. The left ventricle has normal function. Left ventricular endocardial border not optimally defined to evaluate regional wall motion. Left ventricular diastolic parameters are indeterminate.  2. Right ventricular systolic function is normal. The right ventricular size is normal. Tricuspid regurgitation signal is inadequate for assessing PA pressure.  3. The mitral valve is normal in structure. No evidence of mitral valve regurgitation. No evidence of mitral stenosis.  4. The aortic valve is tricuspid. There is mild calcification of the aortic valve. There is mild thickening of the aortic valve. Aortic valve regurgitation is not visualized. No aortic stenosis is present.  5. The inferior vena cava is normal in size with greater than 50% respiratory variability, suggesting right atrial pressure of 3 mmHg. FINDINGS  Left Ventricle: Left ventricular ejection fraction, by estimation, is 60 to 65%. The left ventricle has normal function. Left ventricular endocardial border not optimally defined to evaluate regional wall motion. The left ventricular internal cavity size was normal in size. There is no left ventricular hypertrophy. Left ventricular diastolic parameters are indeterminate. Right Ventricle: The right ventricular size is normal. Right vetricular wall thickness was not well visualized. Right ventricular systolic function is normal. Tricuspid regurgitation signal is inadequate for assessing  PA pressure. Left Atrium: Left atrial size was normal in size. Right Atrium: Right atrial size was normal in size. Pericardium: There is no evidence of pericardial effusion. Mitral Valve: The mitral valve is normal in structure. No evidence of mitral valve regurgitation. No evidence of mitral valve stenosis. Tricuspid Valve: The tricuspid valve is normal in structure. Tricuspid valve regurgitation is not demonstrated. No evidence of tricuspid stenosis. Aortic Valve: The aortic valve is tricuspid. There is mild calcification of the aortic valve. There is mild thickening of the aortic valve. There is mild aortic valve annular calcification. Aortic valve regurgitation is not visualized. No aortic stenosis  is present. Aortic valve mean gradient measures 3.0 mmHg. Aortic valve peak gradient measures 6.0 mmHg. Aortic valve area, by VTI measures 2.84 cm. Pulmonic Valve: The pulmonic valve was not well visualized. Pulmonic valve regurgitation is not visualized. No  evidence of pulmonic stenosis. Aorta: The aortic root is normal in size and structure. Venous: The inferior vena cava is normal in size with greater than 50% respiratory variability, suggesting right atrial pressure of 3 mmHg. IAS/Shunts: No atrial level shunt detected by color flow Doppler.  LEFT VENTRICLE PLAX 2D LVIDd:         5.00 cm   Diastology LVIDs:         3.50 cm   LV e' medial:    6.96 cm/s LV PW:         1.00 cm   LV E/e' medial:  14.2 LV IVS:        1.00 cm   LV e' lateral:   10.40 cm/s LVOT diam:     2.00 cm   LV E/e' lateral: 9.5 LV SV:         58 LV SV Index:   28 LVOT Area:     3.14 cm  RIGHT VENTRICLE             IVC RV Basal diam:  3.50 cm     IVC diam: 1.60 cm RV S prime:     15.20 cm/s TAPSE (M-mode): 2.0 cm LEFT ATRIUM             Index        RIGHT ATRIUM           Index LA diam:        4.30 cm 2.05 cm/m   RA Area:     11.90 cm LA Vol (A2C):   56.3 ml 26.88 ml/m  RA Volume:   24.50 ml  11.70 ml/m LA Vol (A4C):   40.0 ml 19.10 ml/m LA  Biplane Vol: 46.2 ml 22.06 ml/m  AORTIC VALVE                    PULMONIC VALVE AV Area (Vmax):    2.65 cm     PV Vmax:       1.50 m/s AV Area (Vmean):   2.72 cm     PV Peak grad:  9.0 mmHg AV Area (VTI):     2.84 cm AV Vmax:           122.00 cm/s AV Vmean:          78.400 cm/s AV VTI:            0.206 m AV Peak Grad:      6.0 mmHg AV Mean Grad:      3.0 mmHg LVOT Vmax:         103.00 cm/s LVOT Vmean:        67.900 cm/s LVOT VTI:          0.186 m LVOT/AV VTI ratio: 0.90  AORTA Ao Root diam: 3.30 cm Ao Asc diam:  3.00 cm MITRAL VALVE MV Area (PHT): 5.13 cm    SHUNTS MV Decel Time: 148 msec    Systemic VTI:  0.19 m MV E velocity: 98.50 cm/s  Systemic Diam: 2.00 cm Dina Rich MD Electronically signed by Dina Rich MD Signature Date/Time: 08/29/2022/4:14:46 PM    Final    US Abdomen Limited RUQ (LIVER/GB)  Result Date: 08/29/2022 CLINICAL DATA:  Cholelithiasis EXAM: ULTRASOUND ABDOMEN LIMITED RIGHT UPPER QUADRANT COMPARISON:  CT from earlier in the same day, ultrasound from 05/13/2021. FINDINGS: Gallbladder: Gallbladder is well distended. Positive sonographic Murphy's sign is noted. Wall thickening to 4.5 mm is seen. The dependent density seen on prior CT  is not well appreciated on this exam. Common bile duct: Diameter: 3 mm Liver: 1.5 cm echogenic focus is noted in the right lobe of the liver consistent with hemangioma. This is stable from prior ultrasound. No other focal abnormality is noted portal vein is patent on color Doppler imaging with normal direction of blood flow towards the liver. Other: None. IMPRESSION: Stable hepatic hemangioma. Gallbladder wall thickening with positive sonographic Murphy's sign. These changes are consistent with cholecystitis in the appropriate clinical setting. The dependent density seen on prior CT is not well appreciated on this exam. Electronically Signed   By: Alcide Clever M.D.   On: 08/29/2022 10:15   CT Angio Chest Pulmonary Embolism (PE) W or WO  Contrast  Result Date: 08/29/2022 CLINICAL DATA:  Pulmonary embolism suspected, high probability. EXAM: CT ANGIOGRAPHY CHEST WITH CONTRAST TECHNIQUE: Multidetector CT imaging of the chest was performed using the standard protocol during bolus administration of intravenous contrast. Multiplanar CT image reconstructions and MIPs were obtained to evaluate the vascular anatomy. RADIATION DOSE REDUCTION: This exam was performed according to the departmental dose-optimization program which includes automated exposure control, adjustment of the mA and/or kV according to patient size and/or use of iterative reconstruction technique. CONTRAST:  75mL OMNIPAQUE IOHEXOL 350 MG/ML SOLN COMPARISON:  08/25/2021. FINDINGS: Cardiovascular: The heart is enlarged and there is a trace pericardial effusion. Three-vessel coronary artery calcifications are noted. There is atherosclerotic calcification of the aorta without evidence of aneurysm. The pulmonary trunk is normal in caliber. Small pulmonary artery filling defects are noted in the subsegmental arteries in the left lower lobe. Evaluation is slightly limited due to respiratory motion. Dilatation of the right ventricle is noted. Mediastinum/Nodes: No enlarged mediastinal, hilar, or axillary lymph nodes. Thyroid gland, trachea, and esophagus demonstrate no significant findings. Lungs/Pleura: There is a trace right pleural effusion with atelectasis at the lung bases. No pneumothorax. Upper Abdomen: Hyperdense material is present in the dependent portion of the urinary bladder, possible stones or sludge. There is gallbladder wall thickening with pericholecystic fat stranding. Musculoskeletal: Degenerative changes are present in the thoracic spine. Kyphoplasty changes are present at T12. No acute osseous abnormality. Review of the MIP images confirms the above findings. IMPRESSION: 1. Small subsegmental pulmonary emboli in the left lower lobe. There is dilatation of the right ventricle  which is unchanged from the prior exam and may be related to right heart failure given small thrombus volume. Correlate clinically to exclude superimposed strain. 2. Trace right pleural effusion with atelectasis at the lung bases bilaterally. 3. Layering hyperdense material in the gallbladder, possible stones or sludge, with gallbladder wall thickening and surrounding fat stranding, concerning for cholecystitis. Ultrasound is recommended for further evaluation. 4. Three-vessel coronary artery calcifications. 5. Aortic atherosclerosis. Critical Value/emergent results were called by telephone at the time of interpretation on 08/29/2022 at 3:44 am to provider TIMOTHY OPYD , who verbally acknowledged these results. Electronically Signed   By: Thornell Sartorius M.D.   On: 08/29/2022 03:44   DG Chest 2 View  Result Date: 08/28/2022 CLINICAL DATA:  Dyspnea EXAM: CHEST - 2 VIEW COMPARISON:  09/02/2021 chest radiograph. FINDINGS: Stable cardiomediastinal silhouette with normal heart size. No pneumothorax. No pleural effusion. No pulmonary edema. Mild streaky bibasilar scarring versus atelectasis. No acute consolidative airspace disease. IMPRESSION: Mild streaky bibasilar scarring versus atelectasis. Otherwise no active cardiopulmonary disease. Electronically Signed   By: Delbert Phenix M.D.   On: 08/28/2022 16:59      Subjective: Patient seen and examined at bedside today.  Hemodynamically stable.  Comfortable.  Denies any abdomen pain, nausea or vomiting.  Very eager to go home.  I called his wife and discussed about discharge planning  Discharge Exam: Vitals:   09/01/22 0841 09/01/22 0856  BP: (!) 150/84   Pulse: 77 81  Resp: 18 18  Temp: 98 F (36.7 C)   SpO2:     Vitals:   08/31/22 1931 09/01/22 0532 09/01/22 0841 09/01/22 0856  BP: (!) 144/82 137/75 (!) 150/84   Pulse: 83 87 77 81  Resp: 16 18 18 18   Temp: 98.1 F (36.7 C) 98.1 F (36.7 C) 98 F (36.7 C)   TempSrc: Oral Oral Oral   SpO2: 93%      Weight:  87.2 kg    Height:        General: Pt is alert, awake, not in acute distress Cardiovascular: RRR, S1/S2 +, no rubs, no gallops Respiratory: CTA bilaterally, no wheezing, no rhonchi Abdominal: Soft, NT, ND, bowel sounds +, right upper quadrant drain Extremities: no edema, no cyanosis    The results of significant diagnostics from this hospitalization (including imaging, microbiology, ancillary and laboratory) are listed below for reference.     Microbiology: Recent Results (from the past 240 hour(s))  Expectorated Sputum Assessment w Gram Stain, Rflx to Resp Cult     Status: None   Collection Time: 08/29/22  1:00 PM   Specimen: Expectorated Sputum  Result Value Ref Range Status   Specimen Description EXPECTORATED SPUTUM  Final   Special Requests NONE  Final   Sputum evaluation   Final    THIS SPECIMEN IS ACCEPTABLE FOR SPUTUM CULTURE Performed at [facility], [address] [place], [place] [address]    Report Status 08/29/2022 FINAL  Final  Culture, Respiratory w Gram Stain     Status: None   Collection Time: 08/29/22  1:00 PM  Result Value Ref Range Status   Specimen Description EXPECTORATED SPUTUM  Final   Special Requests NONE Reflexed from [id number]  Final   Gram Stain   Final    FEW SQUAMOUS EPITHELIAL CELLS PRESENT MODERATE WBC PRESENT, PREDOMINANTLY PMN FEW GRAM POSITIVE COCCI FEW GRAM NEGATIVE RODS FEW GRAM NEGATIVE DIPLOCOCCI FEW GRAM POSITIVE RODS    Culture   Final    RARE Normal respiratory flora-no Staph aureus or Pseudomonas seen Performed at [facility], [address], [place], [place] [address]    Report Status 08/31/2022 FINAL  Final  Aerobic/Anaerobic Culture w Gram Stain (surgical/deep wound)     Status: None (Preliminary result)   Collection Time: 08/30/22  4:54 PM   Specimen: Gallbladder; Bile  Result Value Ref Range Status   Specimen Description GALL BLADDER  Final   Special Requests BILE  Final   Gram Stain   Final     RARE WBC PRESENT, PREDOMINANTLY PMN NO ORGANISMS SEEN    Culture   Final    NO GROWTH 2 DAYS NO ANAEROBES ISOLATED; CULTURE IN PROGRESS FOR 5 DAYS Performed at [facility], [address] [place], [place] [address]    Report Status PENDING  Incomplete     Labs: BNP (last 3 results) Recent Labs    09/02/21 1541 08/28/22 1717  BNP 37 168.3*   Basic Metabolic Panel: Recent Labs  Lab 08/28/22 1717 08/28/22 1727 08/29/22 0251 08/30/22 0632 08/31/22 0202  NA 133* 136 134* 137 136  K 4.4 4.4 4.1 4.5 3.8  CL 102  --  99 101  101  CO2 21*  --  21* 25 24  GLUCOSE 181*  --  183* 163* 123*  BUN 14  --  13 15 20   CREATININE 0.80  --  0.87 0.92 1.09  CALCIUM 8.5*  --  9.2 9.1 8.7*  MG  --   --  2.2  --   --    Liver Function Tests: Recent Labs  Lab 08/29/22 0251  AST 31  ALT 30  ALKPHOS 70  BILITOT 0.4  PROT 7.2  ALBUMIN 2.8*   Recent Labs  Lab 08/29/22 0251  LIPASE 47   No results for input(s): "AMMONIA" in the last 168 hours. CBC: Recent Labs  Lab 08/28/22 1717 08/28/22 1727 08/29/22 0251 08/30/22 0632 08/31/22 0202 09/01/22 0640  WBC 12.1*  --  11.8* 12.0* 13.9* 7.8  NEUTROABS 10.6*  --   --   --   --   --   HGB 13.2 14.6 13.8 15.0 13.3 14.4  HCT 40.9 43.0 42.0 47.4 41.5 44.8  MCV 95.6  --  96.3 98.8 97.0 98.7  PLT 209  --  221 287 264 283   Cardiac Enzymes: No results for input(s): "CKTOTAL", "CKMB", "CKMBINDEX", "TROPONINI" in the last 168 hours. BNP: Invalid input(s): "POCBNP" CBG: No results for input(s): "GLUCAP" in the last 168 hours. D-Dimer No results for input(s): "DDIMER" in the last 72 hours. Hgb A1c No results for input(s): "HGBA1C" in the last 72 hours. Lipid Profile No results for input(s): "CHOL", "HDL", "LDLCALC", "TRIG", "CHOLHDL", "LDLDIRECT" in the last 72 hours. Thyroid function studies No results for input(s): "TSH", "T4TOTAL", "T3FREE", "THYROIDAB" in the last 72 hours.  Invalid input(s): "FREET3" Anemia work  up No results for input(s): "VITAMINB12", "FOLATE", "FERRITIN", "TIBC", "IRON", "RETICCTPCT" in the last 72 hours. Urinalysis    Component Value Date/Time   COLORURINE YELLOW 08/27/2021 0245   APPEARANCEUR CLEAR 08/27/2021 0245   LABSPEC 1.014 08/27/2021 0245   PHURINE 7.0 08/27/2021 0245   GLUCOSEU NEGATIVE 08/27/2021 0245   HGBUR NEGATIVE 08/27/2021 0245   BILIRUBINUR NEGATIVE 08/27/2021 0245   BILIRUBINUR Negative 09/06/2018 1023   KETONESUR NEGATIVE 08/27/2021 0245   PROTEINUR NEGATIVE 08/27/2021 0245   UROBILINOGEN negative (A) 09/06/2018 1023   NITRITE NEGATIVE 08/27/2021 0245   LEUKOCYTESUR NEGATIVE 08/27/2021 0245   Sepsis Labs Recent Labs  Lab 08/29/22 0251 08/30/22 0632 08/31/22 0202 09/01/22 0640  WBC 11.8* 12.0* 13.9* 7.8   Microbiology Recent Results (from the past 240 hour(s))  Expectorated Sputum Assessment w Gram Stain, Rflx to Resp Cult     Status: None   Collection Time: 08/29/22  1:00 PM   Specimen: Expectorated Sputum  Result Value Ref Range Status   Specimen Description EXPECTORATED SPUTUM  Final   Special Requests NONE  Final   Sputum evaluation   Final    THIS SPECIMEN IS ACCEPTABLE FOR SPUTUM CULTURE Performed at [facility], [address], [place], [place] [address]    Report Status 08/29/2022 FINAL  Final  Culture, Respiratory w Gram Stain     Status: None   Collection Time: 08/29/22  1:00 PM  Result Value Ref Range Status   Specimen Description EXPECTORATED SPUTUM  Final   Special Requests NONE Reflexed from [id number]  Final   Gram Stain   Final    FEW SQUAMOUS EPITHELIAL CELLS PRESENT MODERATE WBC PRESENT, PREDOMINANTLY PMN FEW GRAM POSITIVE COCCI FEW GRAM NEGATIVE RODS FEW GRAM NEGATIVE DIPLOCOCCI FEW GRAM POSITIVE RODS    Culture   Final  RARE Normal respiratory flora-no Staph aureus or Pseudomonas seen Performed at [facility], [address] [place], [place] [address]    Report Status 08/31/2022 FINAL  Final   Aerobic/Anaerobic Culture w Gram Stain (surgical/deep wound)     Status: None (Preliminary result)   Collection Time: 08/30/22  4:54 PM   Specimen: Gallbladder; Bile  Result Value Ref Range Status   Specimen Description GALL BLADDER  Final   Special Requests BILE  Final   Gram Stain   Final    RARE WBC PRESENT, PREDOMINANTLY PMN NO ORGANISMS SEEN    Culture   Final    NO GROWTH 2 DAYS NO ANAEROBES ISOLATED; CULTURE IN PROGRESS FOR 5 DAYS Performed at [facility], [address], [place], [place] [address]    Report Status PENDING  Incomplete    Please note: You were cared for by a hospitalist during your hospital stay. Once you are discharged, your primary care physician will handle any further medical issues. Please note that NO REFILLS for any discharge medications will be authorized once you are discharged, as it is imperative that you return to your primary care physician (or establish a relationship with a primary care physician if you do not have one) for your post hospital discharge needs so that they can reassess your need for medications and monitor your lab values.    Time coordinating discharge: 40 minutes  SIGNED:   Burnadette Pop, MD  [facility] 09/01/2022, 12:59 PM Pager [phone]  If 7PM-7AM, please contact night-coverage [email or web address] Password [email or web address]

## 2022-09-01 NOTE — Plan of Care (Signed)

## 2022-09-01 NOTE — Discharge Instructions (Signed)
Information on my medicine - ELIQUIS (apixaban)  Why was Eliquis prescribed for you? Eliquis was prescribed to treat blood clots that may have been found in the veins of your legs (deep vein thrombosis) or in your lungs (pulmonary embolism) and to reduce the risk of them occurring again.  What do You need to know about Eliquis ? The starting dose is 10 mg (two 5 mg tablets) taken TWICE daily for the FIRST SEVEN (7) DAYS, then on 09/08/2022  the dose is reduced to ONE 5 mg tablet taken TWICE daily.  Eliquis may be taken with or without food.   Try to take the dose about the same time in the morning and in the evening. If you have difficulty swallowing the tablet whole please discuss with your pharmacist how to take the medication safely.  Take Eliquis exactly as prescribed and DO NOT stop taking Eliquis without talking to the doctor who prescribed the medication.  Stopping may increase your risk of developing a new blood clot.  Refill your prescription before you run out.  After discharge, you should have regular check-up appointments with your healthcare provider that is prescribing your Eliquis.    What do you do if you miss a dose? If a dose of ELIQUIS is not taken at the scheduled time, take it as soon as possible on the same day and twice-daily administration should be resumed. The dose should not be doubled to make up for a missed dose.  Important Safety Information A possible side effect of Eliquis is bleeding. You should call your healthcare provider right away if you experience any of the following: Bleeding from an injury or your nose that does not stop. Unusual colored urine (red or dark brown) or unusual colored stools (red or black). Unusual bruising for unknown reasons. A serious fall or if you hit your head (even if there is no bleeding).  Some medicines may interact with Eliquis and might increase your risk of bleeding or clotting while on Eliquis. To help avoid  this, consult your healthcare provider or pharmacist prior to using any new prescription or non-prescription medications, including herbals, vitamins, non-steroidal anti-inflammatory drugs (NSAIDs) and supplements.  This website has more information on Eliquis (apixaban): http://www.eliquis.com/eliquis/home

## 2022-09-01 NOTE — Telephone Encounter (Signed)
Pt wife would like for a nurse to call her

## 2022-09-01 NOTE — Progress Notes (Addendum)
ANTICOAGULATION CONSULT NOTE  Pharmacy Consult for heparin Indication:  acute PE despite Xarelto PTA for PAF  Allergies  Allergen Reactions   Naproxen Other (See Comments)    (Naprosyn *ANALGESICS - ANTI-INFLAMMATORY*) Nausea, Abdominal pain   Silodosin Other (See Comments)    Low blood pressure    Patient Measurements: Height: 5\' 11"  (180.3 cm) Weight: 87.2 kg (192 lb 3.2 oz) IBW/kg (Calculated) : 75.3  Vital Signs: Temp: 98 F (36.7 C) (06/12 0841) Temp Source: Oral (06/12 0841) BP: 150/84 (06/12 0841) Pulse Rate: 81 (06/12 0856)  Labs: Recent Labs    08/30/22 0632 08/30/22 1205 08/31/22 0202 08/31/22 1047 09/01/22 0640  HGB 15.0  --  13.3  --  14.4  HCT 47.4  --  41.5  --  44.8  PLT 287  --  264  --  283  APTT 77*  --  59* 88* 137*  LABPROT  --  14.5  --   --   --   INR  --  1.1  --   --   --   HEPARINUNFRC 0.92*  --  0.58  --  >1.10*  CREATININE 0.92  --  1.09  --   --      Estimated Creatinine Clearance: 65.2 mL/min (by C-G formula based on SCr of 1.09 mg/dL).   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 M75.00)   Allergic rhinitis due to pollen 09/03/2013   J30.1)  Formatting of this note might be different from the original. J30.1)   Allergy    Anticoagulated 07/01/2016   Anxiety    Arthritis    Atrial fibrillation (HCC)    Atrial flutter (HCC) 06/26/2015   CAD (coronary artery disease)    Chest pain 06/26/2015   COPD (chronic obstructive pulmonary disease) (HCC)    Coronary artery disease 07/06/2018   Cardiac catheterization 2017 showing 90% small diagonal branch disease   DDD (degenerative disc disease), cervical 09/03/2013   M50.90)  Formatting of this note might be different from the original. M50.90)   Depression     Depression    Depressive disorder 09/03/2013   Dizziness 10/28/2017   Dyslipidemia, goal LDL below 70 07/06/2018   Dyspnea on exertion 10/20/2018   Esophageal reflux 09/03/2013   Essential hypertension 07/06/2018   ETOH abuse 01/11/2019   6 pack of beer per day   Falls 10/28/2017   GERD (gastroesophageal reflux disease)    H/O amiodarone therapy 07/01/2016   Heart attack (HCC)    Heart palpitations 01/27/2017   History of colon polyps    Hyperlipidemia    Hypertension    IPF (idiopathic pulmonary fibrosis) (HCC)    Kidney stones    Neck pain 09/03/2013   Neuropathy 07/06/2018   Obstructive sleep apnea 08/05/2015   PLMD (periodic limb movement disorder) 11/04/2015   Polycythemia, 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 STORY: Due to alcohol/ tobacco   Pure hypercholesterolemia 09/03/2013   E78.0)  Formatting of this note might be different from the original. E78.0)   Screening for prostate cancer 09/03/2013   Smoker 01/11/2019   Smoking 07/06/2018   Status post ablation of atrial flutter 07/06/2018   2017    Assessment: 72yo male c/o SOB  x2-3wk, CT reveals small subsegmental PE despite Xarelto therapy PTA for PAF. Last dose of Xarelto reported to be taken 6/7 at 1700. From review of pharmacy records, it appears good compliance with Xarelto.  Pharmacy consulted to transition to heparin.  Heparin level came back elevated at >1.1, aPTT came back elevated at 137 - was drawn appropriately per lab but significantly difference from previous levels. Hgb 14.4, plt 283. No s/sx of bleeding. Discussed with MD, change to apixaban given likely Xarelto failure. Given DOAC failure and not therapeutic for entire course of heparin, will do 7 day loading period.   Goal of Therapy:  Heparin level 0.3-0.7 units/ml aPTT 66-102 seconds Monitor platelets by anticoagulation protocol: Yes   Plan:  Stop heparin infusion   Start apixaban 10 mg BID for 7 days and then 5 mg BID  Monitor CBC and for s/sx of bleeding  Thank you for allowing pharmacy to participate in this patient's care,  Sherron Monday, PharmD, BCCCP Clinical Pharmacist  Phone: [phone] 09/01/2022 9:39 AM  Please check AMION for all [facility] Pharmacy phone numbers After 10:00 PM, call Main Pharmacy [phone]

## 2022-09-01 NOTE — Telephone Encounter (Signed)
Called and spoke with pt about getting a visit scheduled. We scheduled the OV for June 27th at 11:30 am. Pt wife had concerns that Robert Lang blood clots could be related to post covid. She would like to discuss this at this OV. Robert Lang just left the hospital today 09/01/22 and is feeling better, oxygen stats are in the 90's. Nothing further needed at this time

## 2022-09-01 NOTE — Progress Notes (Signed)
Discharge instructions (including medications) discussed with and copy provided to patient/caregiver 

## 2022-09-02 ENCOUNTER — Telehealth: Payer: Self-pay

## 2022-09-02 NOTE — Transitions of Care (Post Inpatient/ED Visit) (Signed)
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 LBM this AM. Denies any SOB. He has occasional prod cough at times-taking abxs and using inhaler as ordered.) Any questions or concerns?: No  Items Reviewed: Did you receive and understand the discharge instructions provided?: Yes Medications obtained,verified, and reconciled?: Partial Review Completed Reason for Partial Mediation Review: pt reports spouse manages meds for him and currently not in home-reviewed with pt newmeds and med changes Any new allergies since your discharge?: No Dietary orders reviewed?: Yes Type of Diet Ordered:: low salt/heart healthy Do you have support at home?: Yes People in Home: spouse Name of Support/Comfort Primary Source: [name]  Medications Reviewed Today:    Home Care and Equipment/Supplies: Were Home Health Services Ordered?: NA Any new equipment or medical supplies ordered?: NA  Functional Questionnaire: Do you need assistance with bathing/showering or dressing?: No Do you need assistance with meal preparation?: No Do you need assistance with eating?: No Do you have difficulty maintaining continence: No Do you need assistance with getting out of bed/getting out of a chair/moving?: No Do you have difficulty managing or taking your medications?: Yes (wife manages meds)  Follow up appointments reviewed: PCP Follow-up  appointment confirmed?: Yes Date of PCP follow-up appointment?: 09/08/22 Follow-up Provider: Edwena Bunde Specialist [facility] Follow-up appointment confirmed?: Yes Date of Specialist follow-up appointment?: 09/16/22 Follow-Up Specialty Provider:: Dr. Bennett Scrape Do you need transportation to your follow-up appointment?: No Do you understand care options if your condition(s) worsen?: Yes-patient verbalized understanding     Medications Reviewed Today     Reviewed by Charlyn Minerva, RN (Registered Nurse) on 09/02/22 at 1349  Med List Status: <None>   Medication Order Taking? Sig Documenting Provider Last Dose Status Informant  amoxicillin-clavulanate (AUGMENTIN) 875-125 MG tablet [id number] Yes Take 1 tablet by mouth every 12 (twelve) hours for 5 days. Burnadette Pop, MD Taking Active   apixaban (ELIQUIS) 5 MG TABS tablet [id number]  Take 1 tablet (5 mg total) by mouth 2 (two) times daily. Burnadette Pop, MD  Active   APIXABAN Everlene Balls) VTE STARTER PACK (10MG  AND 5MG ) [id number] Yes Take 2 tablets (10 mg total) by mouth 2 (two) times daily for 7 days. THEN take 1 tablet (5 mg total) by mouth twice a day. BEGIN WITH EVENING DOSE OF DAY 1 OF STARTER PACK Adhikari, Amrit, MD Taking Active   atorvastatin (LIPITOR) 80 MG tablet [id number]  Take 1 tablet (80 mg total) by mouth daily. Georgeanna Lea, MD  Active Spouse/Significant Other, Pharmacy Records  buPROPion (WELLBUTRIN XL) 300 MG 24 hr tablet [id number]  Take 1 tablet (300 mg total) by mouth daily. Joan Flores, NP  Active Spouse/Significant Other, Pharmacy Records  busPIRone (BUSPAR) 15 MG tablet [id number]  Take 1 tablet (15 mg total) by mouth 3 (three) times daily. Joan Flores, NP  Active Spouse/Significant Other, Pharmacy Records  Cholecalciferol 25 MCG (1000 UT) tablet [id number]  Take 1,000 Units by mouth daily.  [provider]  Active Spouse/Significant Other, Pharmacy Records  docusate sodium (COLACE) 100  MG capsule [id number]  Take 100 mg by mouth daily. [provider]  Active Spouse/Significant Other, Pharmacy Records  famotidine (PEPCID) 40 MG tablet [id number]  TAKE 1 TABLET(40 MG) BY MOUTH AT BEDTIME  Patient taking differently: Take 40 mg by mouth at bedtime.   Georgeanna Lea, MD  Active Spouse/Significant Other, Pharmacy Records  fexofenadine [facility]) 180 MG tablet [id number]  Take 180 mg by mouth at bedtime.  [provider]  Active Spouse/Significant Other, Pharmacy Records  finasteride (PROSCAR) 5 MG tablet [id number]  Take 1 tablet (5 mg total) by mouth daily. Georgeanna Lea, MD  Active Spouse/Significant Other, Pharmacy Records  Ginger, Zingiber officinalis, (GINGER ROOT) 550 MG CAPS [id number]  Take 550 mg by mouth daily. [provider]  Active Spouse/Significant Other, Pharmacy Records  lamoTRIgine (LAMICTAL) 25 MG tablet [id number]  Take one tablet by mouth 25 mg daily for 14 days, then take two tablets 50 mg total daily.  Patient taking differently: Take 25 mg by mouth daily. started 25mg  08/22/22   Joan Flores, NP  Active Spouse/Significant Other, Pharmacy Records  Lidocaine 4 % LOTN [id number]  Use as needed in the bilateral lower extremities  Patient taking differently: Apply 1 application  topically as needed (LE pain). Use as needed in the bilateral lower extremities   Windell Norfolk, MD  Active Spouse/Significant Other, Pharmacy Records  LORazepam (ATIVAN) 0.5 MG tablet [id number]  Take one tablet by mouth only for severe anxiety or agitation  Patient not taking: Reported on 08/29/2022   Joan Flores, NP  Active Spouse/Significant Other, Pharmacy Records           Med Note Raquel James, Gertie Gowda   Sun Aug 29, 2022  1:43 AM) Hasn't taken any since prescribed  melatonin 5 MG TABS [id number]  Take 10 mg by mouth at bedtime. [provider]  Active Spouse/Significant Other, Pharmacy Records  Multiple Vitamins-Minerals (PRESERVISION AREDS PO)  [id number]  Take 1 capsule by mouth in the morning and at bedtime. [provider]  Active Spouse/Significant Other, Pharmacy Records  nitroGLYCERIN (NITROSTAT) 0.4 MG SL tablet [id number]  Place 1 tablet (0.4 mg total) under the tongue every 5 (five) minutes as needed for chest pain. Georgeanna Lea, MD  Active Spouse/Significant Other, Pharmacy Records  pantoprazole (PROTONIX) 40 MG tablet [id number]  TAKE 1 TABLET(40 MG) BY MOUTH DAILY  Patient taking differently: Take 40 mg by mouth daily.   Lynann Bologna, MD  Active Spouse/Significant Other, Pharmacy Records  Pirfenidone (ESBRIET) 801 MG TABS [id number]  Take 1 tablet (801 mg total) by mouth 3 (three) times daily. Kalman Shan, MD  Active Spouse/Significant Other, Pharmacy Records           Med Note Raquel James, Gertie Gowda   Sun Aug 29, 2022  1:40 AM) Wife bringing med 6/9 am  Polyethylene Glycol 3350 (MIRALAX PO) [id number]  Take 17 g by mouth daily. [provider]  Active Spouse/Significant Other, Pharmacy Records  predniSONE (DELTASONE) 20 MG tablet [id number] Yes Take 2 tablets (40 mg total) by mouth daily with breakfast for 2 days. Burnadette Pop, MD Taking Active   pregabalin (LYRICA) 150 MG capsule [id number]  Take 1 capsule (150 mg total) by mouth 2 (two) times daily. Windell Norfolk, MD  Active Spouse/Significant Other, Pharmacy Records  ranolazine (RANEXA) 1000 MG SR tablet [id number]  Take 1 tablet (1,000 mg total) by mouth 2 (two) times daily. Bing Matter,  Marveen Reeks, MD  Active Spouse/Significant Other, Pharmacy Records  sertraline (ZOLOFT) 100 MG tablet [id number]  Take 1.5 tablets (150 mg total) by mouth at bedtime. Joan Flores, NP  Active Spouse/Significant Other, Pharmacy Records  Sodium Chloride Flush (NORMAL SALINE FLUSH) 0.9 % SOLN [id number]  Flush drain with 5 mLs daily. Discard remaining 5ml in each syringe. Burnadette Pop, MD  Active   traZODone (DESYREL) 50 MG tablet [id number]  Take 1 tablet (50 mg total) by  mouth at bedtime as needed for sleep (May take 1/2 tab to 1 tablet for sleep, as needed.). Joan Flores, NP  Active Spouse/Significant Other, Pharmacy Records  umeclidinium-vilanterol [facility] ELLIPTA) 62.5-25 MCG/ACT AEPB [id number] Yes Inhale 1 puff into the lungs daily. Burnadette Pop, MD Taking Active   vitamin B-12 (CYANOCOBALAMIN) 1000 MCG tablet [id number]  Take 1,000 mcg by mouth daily. [provider]  Active Spouse/Significant Other, Pharmacy Records  Vitamin D, Ergocalciferol, (DRISDOL) 1.25 MG (50000 UNIT) CAPS capsule [id number]  Take 1 capsule (50,000 Units total) by mouth every 7 (seven) days. [name], [name], [name]  Active Spouse/Significant Other, Pharmacy Records  zolpidem (AMBIEN) 5 MG tablet [id number]  Take 1 tablet (5 mg total) by mouth at bedtime as needed for sleep. Windell Norfolk, MD  Active Spouse/Significant Other, Pharmacy Records            SDOH Interventions Today    Flowsheet Row Most Recent Value  SDOH Interventions   Food Insecurity Interventions Intervention Not Indicated  Transportation Interventions Intervention Not Indicated       TOC Interventions Today    Flowsheet Row Most Recent Value  TOC Interventions   TOC Interventions Discussed/Reviewed TOC Interventions Discussed      Interventions Today    Flowsheet Row Most Recent Value  Chronic Disease   Chronic disease during today's visit Chronic Obstructive Pulmonary Disease (COPD)  General Interventions   General Interventions Discussed/Reviewed General Interventions Discussed, Doctor Visits  Doctor Visits Discussed/Reviewed Doctor Visits Discussed, Specialist, PCP  PCP/Specialist Visits Compliance with follow-up visit  Education Interventions   Education Provided Provided Education  Provided Verbal Education On Nutrition, When to see the doctor, Medication, Other  [resp sx mgmt]  Nutrition Interventions   Nutrition Discussed/Reviewed Nutrition Discussed, Adding fruits and  vegetables, Decreasing salt, Fluid intake  Pharmacy Interventions   Pharmacy Dicussed/Reviewed Pharmacy Topics Discussed, Medications and their functions      Alessandra Grout [facility] Care Management Care Management Community Coordinator Direct Phone: [phone] Toll Free: [phone] Fax: [phone]

## 2022-09-03 LAB — AEROBIC/ANAEROBIC CULTURE W GRAM STAIN (SURGICAL/DEEP WOUND)

## 2022-09-04 LAB — AEROBIC/ANAEROBIC CULTURE W GRAM STAIN (SURGICAL/DEEP WOUND): Culture: NO GROWTH

## 2022-09-08 ENCOUNTER — Ambulatory Visit (INDEPENDENT_AMBULATORY_CARE_PROVIDER_SITE_OTHER): Payer: Medicare Other | Admitting: Medical

## 2022-09-08 VITALS — BP 114/64 | HR 100 | Resp 18 | Ht 71.0 in | Wt 196.8 lb

## 2022-09-08 DIAGNOSIS — F3289 Other specified depressive episodes: Secondary | ICD-10-CM

## 2022-09-08 DIAGNOSIS — J432 Centrilobular emphysema: Secondary | ICD-10-CM

## 2022-09-08 DIAGNOSIS — I2699 Other pulmonary embolism without acute cor pulmonale: Secondary | ICD-10-CM

## 2022-09-08 DIAGNOSIS — J84112 Idiopathic pulmonary fibrosis: Secondary | ICD-10-CM | POA: Diagnosis not present

## 2022-09-08 DIAGNOSIS — K819 Cholecystitis, unspecified: Secondary | ICD-10-CM | POA: Diagnosis not present

## 2022-09-08 DIAGNOSIS — I4891 Unspecified atrial fibrillation: Secondary | ICD-10-CM | POA: Diagnosis not present

## 2022-09-08 MED ORDER — ONDANSETRON HCL 4 MG PO TABS: 4.0000 mg | ORAL_TABLET | Freq: Three times a day (TID) | ORAL | 0 refills | Status: DC | PRN

## 2022-09-08 NOTE — Patient Instructions (Addendum)
1. Cholecystitis Placed referral to general surgeon and care will be coordinaed with interventional radiologist. - Comp Met (CMET) - CBC w/Diff - Lipase - Ambulatory referral to General Surgery -asking staff to schedule home health to come out.  2. Centrilobular emphysema (HCC) and . IPF (idiopathic pulmonary fibrosis) (HCC) Continue anoro inhaler. Keep checking 02 sat daily. If trending downward less than 90% let us know.  3) Atrial fibrillation, unspecified type (HCC) Continue to follow up with Dr. Dewayne Shorter. Continue cardiac meds and eliquis. Since eliquis expensive ask you reach out to his Dr. Kirtland Bouchard office to see if they have samples.  4)Other depression with anxiety.  Wellbutrin, Buspar, Zoloft, trazodone.   5) PE. Continue Eliquis. 5 mg twice a day.   Follow up date to be determined after lab review.

## 2022-09-08 NOTE — Progress Notes (Signed)
Subjective:    Patient ID: Robert Lang, male    DOB: 16-Mar-1951, 72 y.o.   MRN: [id number]  HPI  Pt in for follow up from hospitalization.    Admit date: 08/28/2022 Discharge date: 09/01/2022   Admitted From: Home Disposition:  Home   Discharge Condition:Stable CODE STATUS:FULL Diet recommendation:  Regular   Brief/Interim Summary: Patient is a 72 year old male with past medical history significant for COPD, IPF, OSA, depression, anxiety, coronary artery disease, paroxysmal atrial fibrillation on Xarelto.  Patient was admitted with worsening shortness of breath and fatigue.  Worsening shortness of breath improved with DuoNeb and steroids.  CT of the chest done as part of patient's workup revealed a small subsegmental pulmonary embolism.  Patient was on Xarelto prior to admission.  Patient was started  on heparin drip.  CT scan of the chest also revealed possible cholecystitis.  Right upper quadrant ultrasound is also suggestive of cholecystitis.  Surgical team was  consulted.  Patient has undergone cholecystostomy.  General surgery planning to follow-up as an outpatient as well as IR.  He remains very comfortable today without any abdomen pain, nausea or vomiting.  He will be discharged with right upper quadrant drain.  He has been switched to Eliquis on discharge.  Medically stable for discharge   Following problems were addressed during the hospitalization:    Acute hypoxic respiratory failure; COPD; IPF; OSA  -Suspect secondary to COPD exacerbation. -Improved with DuoNebs and steroids. -CTA chest revealed small subsegmental pulmonary embolism. -Patient was on Xarelto prior to admission. -Patient started on heparin drip,now switched to eliquis. -Remains on room air today.  Lungs are clear on auscultation.  He will follow-up with pulmonology as an outpatient   2. CAD  - No anginal complaints  - Continue Lipitor, Ranexa     3. PAF  -Currently normal sinus rhythm.  On  Eliquis.   4. Depression, anxiety   - Continue Wellbutrin, Buspar, Zoloft, trazodone     5.Acute cholecystitis: -Recent history of fever, chills, nausea and vomiting. -CTA chest revealed likely cholecystitis. -Right upper quadrant ultrasound was suggestive of likely cholecystitis. -08/30/2022: Status post cholecystostomy by the interventional radiology team.  He will follow-up with general surgery as an outpatient for interval cholecystectomy.  He will be discharged with right upper quadrant drain.  Antibiotics changed to oral.   Discharge Diagnoses:  Principal Problem:   Acute respiratory failure with hypoxemia (HCC) Active Problems:   Coronary artery disease   IPF (idiopathic pulmonary fibrosis) (HCC)   COPD (chronic obstructive pulmonary disease) (HCC)   Depressive disorder   CAD (coronary artery disease)   Atrial fibrillation (HCC)   Anxiety   OSA (obstructive sleep apnea)   COPD with exacerbation (HCC)   Single subsegmental pulmonary embolism without acute cor pulmonale (HCC)   COPD exacerbation (HCC)    Pt update me know his 02 sat is 95%. His respiration now close to former baseline. Prior to hospitalization his 02 sats were less 90% week prior to admisstion. His 02 came down to 85% before being admitted. Hx of copd, ipf.  No having chest pain/anginal pain.  Pt on Eliquis for PAF and using for recent PE.   Pt describes when his hospitalized he describes struggled with mood but now better.  He is on Wellbutrin, Buspar, Zoloft, trazodone.  Pt has recent good appetite. No abdomen pain. No fever, no chills or sweats. Pt does have some nausea.    Pt has plan to follow up with intervential radiology  and with general surgeon.  Review of Systems  Constitutional:  Negative for chills and fever.  Respiratory:  Negative for cough, chest tightness, shortness of breath and wheezing.        Stable presently.  Cardiovascular:  Negative for chest pain and palpitations.   Gastrointestinal:  Negative for abdominal pain.  Musculoskeletal:  Negative for arthralgias, back pain and neck pain.  Skin:  Negative for rash.  Neurological:  Negative for dizziness, seizures, weakness and light-headedness.  Hematological:  Negative for adenopathy. Does not bruise/bleed easily.  Psychiatric/Behavioral:  Negative for behavioral problems and decreased concentration.            Objective:   Physical Exam  General Mental Status- Alert. General Appearance- Not in acute distress.   Skin General: Color- Normal Color. Moisture- Normal Moisture.  Neck Carotid Arteries- Normal color. Moisture- Normal Moisture. No carotid bruits. No JVD.  Chest and Lung Exam Auscultation: Breath Sounds:-Normal.  Cardiovascular Auscultation:Rythm- Regular. Murmurs & Other Heart Sounds:Auscultation of the heart reveals- No Murmurs.  Abdomen Inspection:-Inspeection Normal. Palpation/Percussion:Note:No mass. Palpation and Percussion of the abdomen reveal- Non Tender, Non Distended + BS, no rebound or guarding.  Neurologic Cranial Nerve exam:- CN III-XII intact(No nystagmus), symmetric smile. Strength:- 5/5 equal and symmetric strength both upper and lower extremities.       Assessment & Plan:   Patient Instructions  1. Cholecystitis Placed referral to general surgeon and care will be coordinaed with interventional radiologist. - Comp Met (CMET) - CBC w/Diff - Lipase - Ambulatory referral to General Surgery -asking staff to schedule home health to come out.  2. Centrilobular emphysema (HCC) and . IPF (idiopathic pulmonary fibrosis) (HCC) Continue anoro inhaler. Keep checking 02 sat daily. If trending downward less than 90% let us know.  3) Atrial fibrillation, unspecified type (HCC) Continue to follow up with Dr. Dewayne Shorter. Continue cardiac meds and eliquis. Since eliquis expensive ask you reach out to his Dr. Kirtland Bouchard office to see if they have samples.  4)Other depression  with anxiety.   5) PE. Continue Eliquis. 5 mg twice a day.   Follow up date to be determined after lab review.     [name], [name]

## 2022-09-09 ENCOUNTER — Telehealth: Payer: Self-pay | Admitting: Medical

## 2022-09-09 DIAGNOSIS — K819 Cholecystitis, unspecified: Secondary | ICD-10-CM

## 2022-09-09 LAB — COMPREHENSIVE METABOLIC PANEL
ALT: 25 U/L (ref 0–53)
AST: 17 U/L (ref 0–37)
Albumin: 3.5 g/dL (ref 3.5–5.2)
Alkaline Phosphatase: 63 U/L (ref 39–117)
BUN: 16 mg/dL (ref 6–23)
CO2: 21 mEq/L (ref 19–32)
Calcium: 8.8 mg/dL (ref 8.4–10.5)
Chloride: 104 mEq/L (ref 96–112)
Creatinine, Ser: 0.96 mg/dL (ref 0.40–1.50)
GFR: 79 mL/min (ref >60.00)
Glucose, Bld: 125 mg/dL — ABNORMAL HIGH (ref 70–99)
Potassium: 4.7 mEq/L (ref 3.5–5.1)
Sodium: 135 mEq/L (ref 135–145)
Total Bilirubin: 0.5 mg/dL (ref 0.2–1.2)
Total Protein: 6.7 g/dL (ref 6.0–8.3)

## 2022-09-09 LAB — CBC WITH DIFFERENTIAL/PLATELET
Basophils Absolute: 0.1 10*3/uL (ref 0.0–0.1)
Basophils Relative: 0.7 % (ref 0.0–3.0)
Eosinophils Absolute: 0.2 10*3/uL (ref 0.0–0.7)
Eosinophils Relative: 1.5 % (ref 0.0–5.0)
HCT: 49.5 % (ref 39.0–52.0)
Hemoglobin: 16.1 g/dL (ref 13.0–17.0)
Lymphocytes Relative: 14.5 % (ref 12.0–46.0)
Lymphs Abs: 1.6 10*3/uL (ref 0.7–4.0)
MCHC: 32.6 g/dL (ref 30.0–36.0)
MCV: 94.9 fl (ref 78.0–100.0)
Monocytes Absolute: 1.2 10*3/uL — ABNORMAL HIGH (ref 0.1–1.0)
Monocytes Relative: 10.8 % (ref 3.0–12.0)
Neutro Abs: 8 10*3/uL — ABNORMAL HIGH (ref 1.4–7.7)
Neutrophils Relative %: 72.5 % (ref 43.0–77.0)
Platelets: 325 10*3/uL (ref 150.0–400.0)
RBC: 5.21 Mil/uL (ref 4.22–5.81)
RDW: 14.2 % (ref 11.5–15.5)
WBC: 11 10*3/uL — ABNORMAL HIGH (ref 4.0–10.5)

## 2022-09-09 LAB — LIPASE: Lipase: 200 U/L — ABNORMAL HIGH (ref 11.0–59.0)

## 2022-09-09 NOTE — Telephone Encounter (Signed)
No home health referral placed please advise

## 2022-09-09 NOTE — Telephone Encounter (Signed)
Unable to fill out referral fully ,  will wait until PCP is in office to finish - orders pended

## 2022-09-09 NOTE — Addendum Note (Signed)
Addended by: Gwenevere Abbot on: 09/09/2022 03:16 PM   Modules accepted: Orders

## 2022-09-09 NOTE — Telephone Encounter (Signed)
Pt's wife states they are already seeing Dr. Sheliah Hatch for cholecystitis and do not need to szee Dr.Arredondo. she stated they only discussed a HH referral with pcp for wound care and are now confused and they have not gotten a call about hat. Please advise.

## 2022-09-10 NOTE — Telephone Encounter (Signed)
Referral placed.

## 2022-09-12 DIAGNOSIS — I2510 Atherosclerotic heart disease of native coronary artery without angina pectoris: Secondary | ICD-10-CM | POA: Diagnosis not present

## 2022-09-12 DIAGNOSIS — G4761 Periodic limb movement disorder: Secondary | ICD-10-CM | POA: Diagnosis not present

## 2022-09-12 DIAGNOSIS — J432 Centrilobular emphysema: Secondary | ICD-10-CM | POA: Diagnosis not present

## 2022-09-12 DIAGNOSIS — J441 Chronic obstructive pulmonary disease with (acute) exacerbation: Secondary | ICD-10-CM | POA: Diagnosis not present

## 2022-09-12 DIAGNOSIS — F418 Other specified anxiety disorders: Secondary | ICD-10-CM | POA: Diagnosis not present

## 2022-09-12 DIAGNOSIS — I452 Bifascicular block: Secondary | ICD-10-CM | POA: Diagnosis not present

## 2022-09-12 DIAGNOSIS — J301 Allergic rhinitis due to pollen: Secondary | ICD-10-CM | POA: Diagnosis not present

## 2022-09-12 DIAGNOSIS — I252 Old myocardial infarction: Secondary | ICD-10-CM | POA: Diagnosis not present

## 2022-09-12 DIAGNOSIS — G4733 Obstructive sleep apnea (adult) (pediatric): Secondary | ICD-10-CM | POA: Diagnosis not present

## 2022-09-12 DIAGNOSIS — Z792 Long term (current) use of antibiotics: Secondary | ICD-10-CM | POA: Diagnosis not present

## 2022-09-12 DIAGNOSIS — F1010 Alcohol abuse, uncomplicated: Secondary | ICD-10-CM | POA: Diagnosis not present

## 2022-09-12 DIAGNOSIS — Z87891 Personal history of nicotine dependence: Secondary | ICD-10-CM | POA: Diagnosis not present

## 2022-09-12 DIAGNOSIS — Z9049 Acquired absence of other specified parts of digestive tract: Secondary | ICD-10-CM | POA: Diagnosis not present

## 2022-09-12 DIAGNOSIS — M1990 Unspecified osteoarthritis, unspecified site: Secondary | ICD-10-CM | POA: Diagnosis not present

## 2022-09-12 DIAGNOSIS — I4892 Unspecified atrial flutter: Secondary | ICD-10-CM | POA: Diagnosis not present

## 2022-09-12 DIAGNOSIS — I480 Paroxysmal atrial fibrillation: Secondary | ICD-10-CM | POA: Diagnosis not present

## 2022-09-12 DIAGNOSIS — K219 Gastro-esophageal reflux disease without esophagitis: Secondary | ICD-10-CM | POA: Diagnosis not present

## 2022-09-12 DIAGNOSIS — J9601 Acute respiratory failure with hypoxia: Secondary | ICD-10-CM | POA: Diagnosis not present

## 2022-09-12 DIAGNOSIS — J84112 Idiopathic pulmonary fibrosis: Secondary | ICD-10-CM | POA: Diagnosis not present

## 2022-09-12 DIAGNOSIS — M5030 Other cervical disc degeneration, unspecified cervical region: Secondary | ICD-10-CM | POA: Diagnosis not present

## 2022-09-12 DIAGNOSIS — I330 Acute and subacute infective endocarditis: Secondary | ICD-10-CM | POA: Diagnosis not present

## 2022-09-12 DIAGNOSIS — G629 Polyneuropathy, unspecified: Secondary | ICD-10-CM | POA: Diagnosis not present

## 2022-09-12 DIAGNOSIS — E7800 Pure hypercholesterolemia, unspecified: Secondary | ICD-10-CM | POA: Diagnosis not present

## 2022-09-12 DIAGNOSIS — I2693 Single subsegmental pulmonary embolism without acute cor pulmonale: Secondary | ICD-10-CM | POA: Diagnosis not present

## 2022-09-12 DIAGNOSIS — I10 Essential (primary) hypertension: Secondary | ICD-10-CM | POA: Diagnosis not present

## 2022-09-13 ENCOUNTER — Other Ambulatory Visit (INDEPENDENT_AMBULATORY_CARE_PROVIDER_SITE_OTHER): Payer: Medicare Other

## 2022-09-13 ENCOUNTER — Telehealth: Payer: Self-pay | Admitting: Medical

## 2022-09-13 DIAGNOSIS — I480 Paroxysmal atrial fibrillation: Secondary | ICD-10-CM | POA: Diagnosis not present

## 2022-09-13 DIAGNOSIS — J441 Chronic obstructive pulmonary disease with (acute) exacerbation: Secondary | ICD-10-CM | POA: Diagnosis not present

## 2022-09-13 DIAGNOSIS — J84112 Idiopathic pulmonary fibrosis: Secondary | ICD-10-CM | POA: Diagnosis not present

## 2022-09-13 DIAGNOSIS — I2693 Single subsegmental pulmonary embolism without acute cor pulmonale: Secondary | ICD-10-CM | POA: Diagnosis not present

## 2022-09-13 DIAGNOSIS — E7800 Pure hypercholesterolemia, unspecified: Secondary | ICD-10-CM | POA: Diagnosis not present

## 2022-09-13 DIAGNOSIS — G629 Polyneuropathy, unspecified: Secondary | ICD-10-CM | POA: Diagnosis not present

## 2022-09-13 DIAGNOSIS — F1010 Alcohol abuse, uncomplicated: Secondary | ICD-10-CM | POA: Diagnosis not present

## 2022-09-13 DIAGNOSIS — I252 Old myocardial infarction: Secondary | ICD-10-CM | POA: Diagnosis not present

## 2022-09-13 DIAGNOSIS — J432 Centrilobular emphysema: Secondary | ICD-10-CM | POA: Diagnosis not present

## 2022-09-13 DIAGNOSIS — I452 Bifascicular block: Secondary | ICD-10-CM | POA: Diagnosis not present

## 2022-09-13 DIAGNOSIS — I330 Acute and subacute infective endocarditis: Secondary | ICD-10-CM | POA: Diagnosis not present

## 2022-09-13 DIAGNOSIS — I2510 Atherosclerotic heart disease of native coronary artery without angina pectoris: Secondary | ICD-10-CM | POA: Diagnosis not present

## 2022-09-13 DIAGNOSIS — I4892 Unspecified atrial flutter: Secondary | ICD-10-CM | POA: Diagnosis not present

## 2022-09-13 DIAGNOSIS — J9601 Acute respiratory failure with hypoxia: Secondary | ICD-10-CM | POA: Diagnosis not present

## 2022-09-13 DIAGNOSIS — F418 Other specified anxiety disorders: Secondary | ICD-10-CM | POA: Diagnosis not present

## 2022-09-13 DIAGNOSIS — I10 Essential (primary) hypertension: Secondary | ICD-10-CM | POA: Diagnosis not present

## 2022-09-13 DIAGNOSIS — K819 Cholecystitis, unspecified: Secondary | ICD-10-CM | POA: Diagnosis not present

## 2022-09-13 LAB — CBC WITH DIFFERENTIAL/PLATELET
Basophils Absolute: 0.1 10*3/uL (ref 0.0–0.1)
Basophils Relative: 0.9 % (ref 0.0–3.0)
Eosinophils Absolute: 0.1 10*3/uL (ref 0.0–0.7)
Eosinophils Relative: 1.5 % (ref 0.0–5.0)
HCT: 43.8 % (ref 39.0–52.0)
Hemoglobin: 14.3 g/dL (ref 13.0–17.0)
Lymphocytes Relative: 19.8 % (ref 12.0–46.0)
Lymphs Abs: 1.4 10*3/uL (ref 0.7–4.0)
MCHC: 32.8 g/dL (ref 30.0–36.0)
MCV: 94.6 fl (ref 78.0–100.0)
Monocytes Absolute: 1 10*3/uL (ref 0.1–1.0)
Monocytes Relative: 13.4 % — ABNORMAL HIGH (ref 3.0–12.0)
Neutro Abs: 4.6 10*3/uL (ref 1.4–7.7)
Neutrophils Relative %: 64.4 % (ref 43.0–77.0)
Platelets: 219 10*3/uL (ref 150.0–400.0)
RBC: 4.63 Mil/uL (ref 4.22–5.81)
RDW: 14.4 % (ref 11.5–15.5)
WBC: 7.2 10*3/uL (ref 4.0–10.5)

## 2022-09-13 LAB — COMPREHENSIVE METABOLIC PANEL
ALT: 23 U/L (ref 0–53)
AST: 16 U/L (ref 0–37)
Albumin: 3.5 g/dL (ref 3.5–5.2)
Alkaline Phosphatase: 59 U/L (ref 39–117)
BUN: 11 mg/dL (ref 6–23)
CO2: 24 mEq/L (ref 19–32)
Calcium: 8.8 mg/dL (ref 8.4–10.5)
Chloride: 104 mEq/L (ref 96–112)
Creatinine, Ser: 0.87 mg/dL (ref 0.40–1.50)
GFR: 86.23 mL/min (ref >60.00)
Glucose, Bld: 119 mg/dL — ABNORMAL HIGH (ref 70–99)
Potassium: 4.5 mEq/L (ref 3.5–5.1)
Sodium: 136 mEq/L (ref 135–145)
Total Bilirubin: 0.5 mg/dL (ref 0.2–1.2)
Total Protein: 6.3 g/dL (ref 6.0–8.3)

## 2022-09-13 LAB — LIPASE: Lipase: 133 U/L — ABNORMAL HIGH (ref 11.0–59.0)

## 2022-09-13 NOTE — Telephone Encounter (Signed)
Caller/Agency: [facility] ([facility]) Callback Number: [phone], Ok to LDVM Requesting OT/PT/Skilled Nursing/Social Work/Speech Therapy: PT Frequency: 1 w 8 

## 2022-09-14 ENCOUNTER — Encounter: Payer: Self-pay | Admitting: Behavioral Health

## 2022-09-14 ENCOUNTER — Ambulatory Visit (INDEPENDENT_AMBULATORY_CARE_PROVIDER_SITE_OTHER): Payer: Medicare Other | Admitting: Behavioral Health

## 2022-09-14 DIAGNOSIS — G4700 Insomnia, unspecified: Secondary | ICD-10-CM | POA: Diagnosis not present

## 2022-09-14 DIAGNOSIS — F411 Generalized anxiety disorder: Secondary | ICD-10-CM

## 2022-09-14 DIAGNOSIS — F32A Depression, unspecified: Secondary | ICD-10-CM | POA: Diagnosis not present

## 2022-09-14 DIAGNOSIS — R451 Restlessness and agitation: Secondary | ICD-10-CM | POA: Diagnosis not present

## 2022-09-14 DIAGNOSIS — R454 Irritability and anger: Secondary | ICD-10-CM

## 2022-09-14 MED ORDER — LAMOTRIGINE 100 MG PO TABS: 100.0000 mg | ORAL_TABLET | Freq: Every day | ORAL | 1 refills | Status: DC

## 2022-09-14 NOTE — Progress Notes (Deleted)
[facility]  Patient ID: Robert Lang,  MRN: [id number]  PCP: [name], [name]  Date of Evaluation: 09/14/2022 Time spent:30 minutes  Chief Complaint:  Chief Complaint   Depression; Anxiety; Follow-up; Patient Education; Irritability     HISTORY/CURRENT STATUS: HPI   72 yo Robert Lang male presents for follow up and medication management. His wife was with him today with his consent.  Says he has noticed some improvement since increasing Zoloft and adding Lamictal. Wife says his agitation and irritability has improved about 50%. He is open to adjusting his medication this visit.  Sleep is ok but utilizing Ambien and Trazodone. Sleeps with C-pap.  Rates anxiety 3/10 and depression 3/10. Denies hx of mania, no psychosis. No auditory or visual hallucinations. SI or HI      Individual Medical History/ Review of Systems: Changes? NO  Allergies: Naproxen and Silodosin  Current Medications:   Current Outpatient Medications:    lamoTRIgine (LAMICTAL) 100 MG tablet, Take 1 tablet (100 mg total) by mouth daily., Disp: 30 tablet, Rfl: 1   apixaban (ELIQUIS) 5 MG TABS tablet, Take 1 tablet (5 mg total) by mouth 2 (two) times daily., Disp: 60 tablet, Rfl: 0   APIXABAN (ELIQUIS) VTE STARTER PACK (10MG  AND 5MG ), Take 2 tablets (10 mg total) by mouth 2 (two) times daily for 7 days. THEN take 1 tablet (5 mg total) by mouth twice a day. ***BEGIN WITH EVENING DOSE OF DAY 1 OF STARTER PACK***, Disp: 74 tablet, Rfl: 0   atorvastatin (LIPITOR) 80 MG tablet, Take 1 tablet (80 mg total) by mouth daily., Disp: 90 tablet, Rfl: 2   buPROPion (WELLBUTRIN XL) 300 MG 24 hr tablet, Take 1 tablet (300 mg total) by mouth daily., Disp: 90 tablet, Rfl: 1   busPIRone (BUSPAR) 15 MG tablet, Take 1 tablet (15 mg total) by mouth 3 (three) times daily., Disp: 90 tablet, Rfl: 3   Cholecalciferol 25 MCG (1000 UT) tablet, Take 1,000 Units by mouth daily. , Disp: , Rfl:    docusate sodium (COLACE) 100 MG  capsule, Take 100 mg by mouth daily., Disp: , Rfl:    famotidine (PEPCID) 40 MG tablet, TAKE 1 TABLET(40 MG) BY MOUTH AT BEDTIME (Patient taking differently: Take 40 mg by mouth at bedtime.), Disp: 90 tablet, Rfl: 2   fexofenadine (ALLEGRA) 180 MG tablet, Take 180 mg by mouth at bedtime. , Disp: , Rfl:    finasteride (PROSCAR) 5 MG tablet, Take 1 tablet (5 mg total) by mouth daily., Disp: 90 tablet, Rfl: 1   Ginger, Zingiber officinalis, (GINGER ROOT) 550 MG CAPS, Take 550 mg by mouth daily., Disp: , Rfl:    lamoTRIgine (LAMICTAL) 25 MG tablet, Take one tablet by mouth 25 mg daily for 14 days, then take two tablets 50 mg total daily. (Patient taking differently: Take 25 mg by mouth daily. started 25mg  08/22/22), Disp: 60 tablet, Rfl: 1   Lidocaine 4 % LOTN, Use as needed in the bilateral lower extremities (Patient taking differently: Apply 1 application  topically as needed (LE pain). Use as needed in the bilateral lower extremities), Disp: 88 mL, Rfl: 0   LORazepam (ATIVAN) 0.5 MG tablet, Take one tablet by mouth only for severe anxiety or agitation (Patient not taking: Reported on 08/29/2022), Disp: 30 tablet, Rfl: 1   melatonin 5 MG TABS, Take 10 mg by mouth at bedtime., Disp: , Rfl:    Multiple Vitamins-Minerals (PRESERVISION AREDS PO), Take 1 capsule by mouth in the morning and at bedtime.,  Disp: , Rfl:    nitroGLYCERIN (NITROSTAT) 0.4 MG SL tablet, Place 1 tablet (0.4 mg total) under the tongue every 5 (five) minutes as needed for chest pain., Disp: 25 tablet, Rfl: 3   ondansetron (ZOFRAN) 4 MG tablet, Take 1 tablet (4 mg total) by mouth every 8 (eight) hours as needed for nausea or vomiting., Disp: 20 tablet, Rfl: 0   pantoprazole (PROTONIX) 40 MG tablet, TAKE 1 TABLET(40 MG) BY MOUTH DAILY (Patient taking differently: Take 40 mg by mouth daily.), Disp: 90 tablet, Rfl: 1   Pirfenidone (ESBRIET) 801 MG TABS, Take 1 tablet (801 mg total) by mouth 3 (three) times daily., Disp: 270 tablet, Rfl: 1    Polyethylene Glycol 3350 (MIRALAX PO), Take 17 g by mouth daily., Disp: , Rfl:    pregabalin (LYRICA) 150 MG capsule, Take 1 capsule (150 mg total) by mouth 2 (two) times daily., Disp: 90 capsule, Rfl: 5   ranolazine (RANEXA) 1000 MG SR tablet, Take 1 tablet (1,000 mg total) by mouth 2 (two) times daily., Disp: 180 tablet, Rfl: 1   sertraline (ZOLOFT) 100 MG tablet, Take 1.5 tablets (150 mg total) by mouth at bedtime., Disp: 135 tablet, Rfl: 1   Sodium Chloride Flush (NORMAL SALINE FLUSH) 0.9 % SOLN, Flush drain with 5 mLs daily. Discard remaining 5ml in each syringe., Disp: 200 mL, Rfl: 0   traZODone (DESYREL) 50 MG tablet, Take 1 tablet (50 mg total) by mouth at bedtime as needed for sleep (May take 1/2 tab to 1 tablet for sleep, as needed.)., Disp: 30 tablet, Rfl: 3   umeclidinium-vilanterol (ANORO ELLIPTA) 62.5-25 MCG/ACT AEPB, Inhale 1 puff into the lungs daily., Disp: 60 each, Rfl: 0   vitamin B-12 (CYANOCOBALAMIN) 1000 MCG tablet, Take 1,000 mcg by mouth daily., Disp: , Rfl:    Vitamin D, Ergocalciferol, (DRISDOL) 1.25 MG (50000 UNIT) CAPS capsule, Take 1 capsule (50,000 Units total) by mouth every 7 (seven) days., Disp: 8 capsule, Rfl: 0   zolpidem (AMBIEN) 5 MG tablet, Take 1 tablet (5 mg total) by mouth at bedtime as needed for sleep., Disp: 30 tablet, Rfl: 5  Medication Side Effects: no  Family Medical/ Social History: Changes? No   MENTAL HEALTH EXAM:  There were no vitals taken for this visit.There is no height or weight on file to calculate BMI.  General Appearance: Casual, Neat, and Well Groomed  Eye Contact:  Good  Speech:  Clear and Coherent  Volume:  Normal  Mood:  Angry, Anxious, and Depressed  Affect:  Appropriate  Thought Process:  Coherent  Orientation:  Full (Time, Place, and Person)  Thought Content: Logical   Suicidal Thoughts:  No  Homicidal Thoughts:  No  Memory:  WNL  Judgement:  Good  Insight:  Good  Psychomotor Activity:  Normal  Concentration:   Concentration: Good  Recall:  Good  Fund of Knowledge: Good  Language: Good  Assets:  Desire for Improvement  ADL's:  Intact  Cognition: WNL  Prognosis:  Good    DIAGNOSES:    ICD-10-CM   1. Depressive disorder  F32.A     2. Generalized anxiety disorder  F41.1     3. Insomnia, unspecified type  G47.00     4. Agitation  R45.1     5. Irritability  R45.4       Receiving Psychotherapy: No    RECOMMENDATIONS:   Greater than 50% of 30 min face to face time with patient was spent on counseling and coordination of care.  We discussed his moderate improvement since initiating Lamictal. Overall feeling much better.     We agreed to: To continue Zoloft to 150 mg daily To continue Trazodone 50 mg at bedtime To increase  Buspar  to 15 mg three times daily To to increase Lamictal to 100 mg beginning on 7/2.  To continue Ativan 0.5 mg once daily prn only for severe anxiety or agitation. To continue Wellbutrin 300 mg XL daily Will report worsening symptoms or side effects promptly To follow up in 8 weeks to reassess Provided emergency contact information Discussed potential benefits, risk, and side effects of benzodiazepines to include potential risk of tolerance and dependence, as well as possible drowsiness.  Advised patient not to drive if experiencing drowsiness and to take lowest possible effective dose to minimize risk of dependence and tolerance.  Monitor for any sign of rash. Please taking Lamictal and contact office immediately rash develops. Recommend seeking urgent medical attention if rash is severe and/or spreading quickly.  Reviewed PDMP    Watt Climes. Letta Cargile, NP

## 2022-09-14 NOTE — Progress Notes (Signed)
[facility]  Patient ID: Robert Lang,  MRN: [id number]  PCP: [name], [name]  Date of Evaluation: 09/14/2022 Time spent:30 minutes  Chief Complaint:  Chief Complaint   Depression; Anxiety; Follow-up; Patient Education; Irritability     HISTORY/CURRENT STATUS: HPI 72 yo Robert Lang male presents for follow up and medication management. His wife was with him today with his consent.  Says he has noticed some improvement since increasing Zoloft and adding Lamictal. Wife says his agitation and irritability has improved about 50%. He is open to adjusting his medication this visit.  Sleep is ok but utilizing Ambien and Trazodone. Sleeps with C-pap.  Rates anxiety 3/10 and depression 3/10. Denies hx of mania, no psychosis. No auditory or visual hallucinations. SI or HI    Individual Medical History/ Review of Systems: Changes? :No   Allergies: Naproxen and Silodosin  Current Medications:  Current Outpatient Medications:    lamoTRIgine (LAMICTAL) 100 MG tablet, Take 1 tablet (100 mg total) by mouth daily., Disp: 30 tablet, Rfl: 1   apixaban (ELIQUIS) 5 MG TABS tablet, Take 1 tablet (5 mg total) by mouth 2 (two) times daily., Disp: 60 tablet, Rfl: 0   APIXABAN (ELIQUIS) VTE STARTER PACK (10MG  AND 5MG ), Take 2 tablets (10 mg total) by mouth 2 (two) times daily for 7 days. THEN take 1 tablet (5 mg total) by mouth twice a day. BEGIN WITH EVENING DOSE OF DAY 1 OF STARTER PACK Disp: 74 tablet, Rfl: 0   atorvastatin (LIPITOR) 80 MG tablet, Take 1 tablet (80 mg total) by mouth daily., Disp: 90 tablet, Rfl: 2   buPROPion (WELLBUTRIN XL) 300 MG 24 hr tablet, Take 1 tablet (300 mg total) by mouth daily., Disp: 90 tablet, Rfl: 1   busPIRone (BUSPAR) 15 MG tablet, Take 1 tablet (15 mg total) by mouth 3 (three) times daily., Disp: 90 tablet, Rfl: 3   Cholecalciferol 25 MCG (1000 UT) tablet, Take 1,000 Units by mouth daily. , Disp: , Rfl:    docusate sodium (COLACE) 100 MG capsule, Take  100 mg by mouth daily., Disp: , Rfl:    famotidine (PEPCID) 40 MG tablet, TAKE 1 TABLET(40 MG) BY MOUTH AT BEDTIME (Patient taking differently: Take 40 mg by mouth at bedtime.), Disp: 90 tablet, Rfl: 2   fexofenadine (ALLEGRA) 180 MG tablet, Take 180 mg by mouth at bedtime. , Disp: , Rfl:    finasteride (PROSCAR) 5 MG tablet, Take 1 tablet (5 mg total) by mouth daily., Disp: 90 tablet, Rfl: 1   Ginger, Zingiber officinalis, (GINGER ROOT) 550 MG CAPS, Take 550 mg by mouth daily., Disp: , Rfl:    lamoTRIgine (LAMICTAL) 25 MG tablet, Take one tablet by mouth 25 mg daily for 14 days, then take two tablets 50 mg total daily. (Patient taking differently: Take 25 mg by mouth daily. started 25mg  08/22/22), Disp: 60 tablet, Rfl: 1   Lidocaine 4 % LOTN, Use as needed in the bilateral lower extremities (Patient taking differently: Apply 1 application  topically as needed (LE pain). Use as needed in the bilateral lower extremities), Disp: 88 mL, Rfl: 0   LORazepam (ATIVAN) 0.5 MG tablet, Take one tablet by mouth only for severe anxiety or agitation (Patient not taking: Reported on 08/29/2022), Disp: 30 tablet, Rfl: 1   melatonin 5 MG TABS, Take 10 mg by mouth at bedtime., Disp: , Rfl:    Multiple Vitamins-Minerals (PRESERVISION AREDS PO), Take 1 capsule by mouth in the morning and at bedtime., Disp: , Rfl:  nitroGLYCERIN (NITROSTAT) 0.4 MG SL tablet, Place 1 tablet (0.4 mg total) under the tongue every 5 (five) minutes as needed for chest pain., Disp: 25 tablet, Rfl: 3   ondansetron (ZOFRAN) 4 MG tablet, Take 1 tablet (4 mg total) by mouth every 8 (eight) hours as needed for nausea or vomiting., Disp: 20 tablet, Rfl: 0   pantoprazole (PROTONIX) 40 MG tablet, TAKE 1 TABLET(40 MG) BY MOUTH DAILY (Patient taking differently: Take 40 mg by mouth daily.), Disp: 90 tablet, Rfl: 1   Pirfenidone (ESBRIET) 801 MG TABS, Take 1 tablet (801 mg total) by mouth 3 (three) times daily., Disp: 270 tablet, Rfl: 1   Polyethylene  Glycol 3350 (MIRALAX PO), Take 17 g by mouth daily., Disp: , Rfl:    pregabalin (LYRICA) 150 MG capsule, Take 1 capsule (150 mg total) by mouth 2 (two) times daily., Disp: 90 capsule, Rfl: 5   ranolazine (RANEXA) 1000 MG SR tablet, Take 1 tablet (1,000 mg total) by mouth 2 (two) times daily., Disp: 180 tablet, Rfl: 1   sertraline (ZOLOFT) 100 MG tablet, Take 1.5 tablets (150 mg total) by mouth at bedtime., Disp: 135 tablet, Rfl: 1   Sodium Chloride Flush (NORMAL SALINE FLUSH) 0.9 % SOLN, Flush drain with 5 mLs daily. Discard remaining 5ml in each syringe., Disp: 200 mL, Rfl: 0   traZODone (DESYREL) 50 MG tablet, Take 1 tablet (50 mg total) by mouth at bedtime as needed for sleep (May take 1/2 tab to 1 tablet for sleep, as needed.)., Disp: 30 tablet, Rfl: 3   umeclidinium-vilanterol (ANORO ELLIPTA) 62.5-25 MCG/ACT AEPB, Inhale 1 puff into the lungs daily., Disp: 60 each, Rfl: 0   vitamin B-12 (CYANOCOBALAMIN) 1000 MCG tablet, Take 1,000 mcg by mouth daily., Disp: , Rfl:    Vitamin D, Ergocalciferol, (DRISDOL) 1.25 MG (50000 UNIT) CAPS capsule, Take 1 capsule (50,000 Units total) by mouth every 7 (seven) days., Disp: 8 capsule, Rfl: 0   zolpidem (AMBIEN) 5 MG tablet, Take 1 tablet (5 mg total) by mouth at bedtime as needed for sleep., Disp: 30 tablet, Rfl: 5 Medication Side Effects: none  Family Medical/ Social History: Changes? No  MENTAL HEALTH EXAM:  There were no vitals taken for this visit.There is no height or weight on file to calculate BMI.  General Appearance: Casual, Neat, and Well Groomed  Eye Contact:  Good  Speech:  Clear and Coherent  Volume:  Normal  Mood:  Anxious  Affect:  Appropriate  Thought Process:  Coherent  Orientation:  Full (Time, Place, and Person)  Thought Content: Logical   Suicidal Thoughts:  No  Homicidal Thoughts:  No  Memory:  WNL  Judgement:  Good  Insight:  Good  Psychomotor Activity:  Normal  Concentration:  Concentration: Good  Recall:  Good  Fund  of Knowledge: Good  Language: Good  Assets:  Desire for Improvement  ADL's:  Intact  Cognition: WNL  Prognosis:  Good    DIAGNOSES:    ICD-10-CM   1. Depressive disorder  F32.A     2. Generalized anxiety disorder  F41.1     3. Insomnia, unspecified type  G47.00     4. Agitation  R45.1     5. Irritability  R45.4       Receiving Psychotherapy: No    RECOMMENDATIONS:   Greater than 50% of 30 min face to face time with patient was spent on counseling and coordination of care. We discussed his moderate improvement since initiating Lamictal. Overall feeling much  better.     We agreed to: To continue Zoloft to 150 mg daily To continue Trazodone 50 mg at bedtime To increase  Buspar  to 15 mg three times daily To to increase Lamictal to 100 mg beginning on 7/2.  To continue Ativan 0.5 mg once daily prn only for severe anxiety or agitation. To continue Wellbutrin 300 mg XL daily Will report worsening symptoms or side effects promptly To follow up in 8 weeks to reassess Provided emergency contact information Discussed potential benefits, risk, and side effects of benzodiazepines to include potential risk of tolerance and dependence, as well as possible drowsiness.  Advised patient not to drive if experiencing drowsiness and to take lowest possible effective dose to minimize risk of dependence and tolerance.  Monitor for any sign of rash. Please taking Lamictal and contact office immediately rash develops. Recommend seeking urgent medical attention if rash is severe and/or spreading quickly.  Reviewed PDMP    Joan Flores, NP

## 2022-09-15 ENCOUNTER — Ambulatory Visit: Payer: Medicare Other | Admitting: Neurology

## 2022-09-16 ENCOUNTER — Emergency Department (HOSPITAL_BASED_OUTPATIENT_CLINIC_OR_DEPARTMENT_OTHER): Payer: Medicare Other

## 2022-09-16 ENCOUNTER — Encounter (HOSPITAL_BASED_OUTPATIENT_CLINIC_OR_DEPARTMENT_OTHER): Payer: Self-pay | Admitting: Emergency Medicine

## 2022-09-16 ENCOUNTER — Other Ambulatory Visit: Payer: Self-pay

## 2022-09-16 ENCOUNTER — Inpatient Hospital Stay (HOSPITAL_BASED_OUTPATIENT_CLINIC_OR_DEPARTMENT_OTHER)
Admission: EM | Admit: 2022-09-16 | Discharge: 2022-09-18 | DRG: 871 | Disposition: A | Discharge status: Home Health | Payer: Medicare Other | Attending: Internal Medicine | Admitting: Internal Medicine

## 2022-09-16 ENCOUNTER — Other Ambulatory Visit (HOSPITAL_COMMUNITY): Payer: Self-pay | Admitting: Interventional Radiology

## 2022-09-16 ENCOUNTER — Ambulatory Visit (HOSPITAL_COMMUNITY)
Admission: RE | Admit: 2022-09-16 | Discharge: 2022-09-16 | Disposition: A | Discharge status: Home / Self Care | Payer: Medicare Other | Source: Ambulatory Visit | Attending: Radiology | Admitting: Radiology

## 2022-09-16 ENCOUNTER — Other Ambulatory Visit (HOSPITAL_COMMUNITY): Payer: Self-pay | Admitting: Radiology

## 2022-09-16 ENCOUNTER — Encounter: Payer: Self-pay | Admitting: Internal Medicine

## 2022-09-16 ENCOUNTER — Ambulatory Visit: Payer: Medicare Other | Admitting: Internal Medicine

## 2022-09-16 VITALS — BP 100/60 | HR 108 | Ht 71.0 in | Wt 197.8 lb

## 2022-09-16 DIAGNOSIS — J9621 Acute and chronic respiratory failure with hypoxia: Secondary | ICD-10-CM

## 2022-09-16 DIAGNOSIS — I10 Essential (primary) hypertension: Secondary | ICD-10-CM | POA: Diagnosis not present

## 2022-09-16 DIAGNOSIS — R109 Unspecified abdominal pain: Secondary | ICD-10-CM | POA: Diagnosis not present

## 2022-09-16 DIAGNOSIS — I252 Old myocardial infarction: Secondary | ICD-10-CM

## 2022-09-16 DIAGNOSIS — J84112 Idiopathic pulmonary fibrosis: Secondary | ICD-10-CM | POA: Diagnosis not present

## 2022-09-16 DIAGNOSIS — R0602 Shortness of breath: Secondary | ICD-10-CM | POA: Diagnosis not present

## 2022-09-16 DIAGNOSIS — R1084 Generalized abdominal pain: Secondary | ICD-10-CM

## 2022-09-16 DIAGNOSIS — A419 Sepsis, unspecified organism: Principal | ICD-10-CM | POA: Diagnosis present

## 2022-09-16 DIAGNOSIS — I452 Bifascicular block: Secondary | ICD-10-CM | POA: Diagnosis not present

## 2022-09-16 DIAGNOSIS — Z86010 Personal history of colonic polyps: Secondary | ICD-10-CM

## 2022-09-16 DIAGNOSIS — Z8709 Personal history of other diseases of the respiratory system: Secondary | ICD-10-CM

## 2022-09-16 DIAGNOSIS — R000 Tachycardia, unspecified: Principal | ICD-10-CM

## 2022-09-16 DIAGNOSIS — Z933 Colostomy status: Secondary | ICD-10-CM

## 2022-09-16 DIAGNOSIS — R6520 Severe sepsis without septic shock: Secondary | ICD-10-CM | POA: Diagnosis present

## 2022-09-16 DIAGNOSIS — I4892 Unspecified atrial flutter: Secondary | ICD-10-CM | POA: Diagnosis not present

## 2022-09-16 DIAGNOSIS — E872 Acidosis, unspecified: Secondary | ICD-10-CM | POA: Diagnosis not present

## 2022-09-16 DIAGNOSIS — K2970 Gastritis, unspecified, without bleeding: Secondary | ICD-10-CM | POA: Diagnosis not present

## 2022-09-16 DIAGNOSIS — I2693 Single subsegmental pulmonary embolism without acute cor pulmonale: Secondary | ICD-10-CM | POA: Diagnosis not present

## 2022-09-16 DIAGNOSIS — R0902 Hypoxemia: Secondary | ICD-10-CM

## 2022-09-16 DIAGNOSIS — F32A Depression, unspecified: Secondary | ICD-10-CM | POA: Diagnosis not present

## 2022-09-16 DIAGNOSIS — Z86711 Personal history of pulmonary embolism: Secondary | ICD-10-CM

## 2022-09-16 DIAGNOSIS — J432 Centrilobular emphysema: Secondary | ICD-10-CM | POA: Diagnosis not present

## 2022-09-16 DIAGNOSIS — G629 Polyneuropathy, unspecified: Secondary | ICD-10-CM | POA: Diagnosis not present

## 2022-09-16 DIAGNOSIS — J441 Chronic obstructive pulmonary disease with (acute) exacerbation: Secondary | ICD-10-CM | POA: Diagnosis not present

## 2022-09-16 DIAGNOSIS — E871 Hypo-osmolality and hyponatremia: Secondary | ICD-10-CM | POA: Diagnosis not present

## 2022-09-16 DIAGNOSIS — K5730 Diverticulosis of large intestine without perforation or abscess without bleeding: Secondary | ICD-10-CM | POA: Diagnosis not present

## 2022-09-16 DIAGNOSIS — Z87891 Personal history of nicotine dependence: Secondary | ICD-10-CM

## 2022-09-16 DIAGNOSIS — F418 Other specified anxiety disorders: Secondary | ICD-10-CM | POA: Diagnosis not present

## 2022-09-16 DIAGNOSIS — Z886 Allergy status to analgesic agent status: Secondary | ICD-10-CM

## 2022-09-16 DIAGNOSIS — R079 Chest pain, unspecified: Secondary | ICD-10-CM | POA: Diagnosis not present

## 2022-09-16 DIAGNOSIS — J9811 Atelectasis: Secondary | ICD-10-CM | POA: Diagnosis not present

## 2022-09-16 DIAGNOSIS — Z9841 Cataract extraction status, right eye: Secondary | ICD-10-CM

## 2022-09-16 DIAGNOSIS — N400 Enlarged prostate without lower urinary tract symptoms: Secondary | ICD-10-CM | POA: Diagnosis present

## 2022-09-16 DIAGNOSIS — G4733 Obstructive sleep apnea (adult) (pediatric): Secondary | ICD-10-CM | POA: Diagnosis present

## 2022-09-16 DIAGNOSIS — J309 Allergic rhinitis, unspecified: Secondary | ICD-10-CM | POA: Diagnosis not present

## 2022-09-16 DIAGNOSIS — Z434 Encounter for attention to other artificial openings of digestive tract: Secondary | ICD-10-CM | POA: Insufficient documentation

## 2022-09-16 DIAGNOSIS — E7800 Pure hypercholesterolemia, unspecified: Secondary | ICD-10-CM | POA: Diagnosis not present

## 2022-09-16 DIAGNOSIS — I480 Paroxysmal atrial fibrillation: Secondary | ICD-10-CM | POA: Diagnosis not present

## 2022-09-16 DIAGNOSIS — R6883 Chills (without fever): Secondary | ICD-10-CM | POA: Diagnosis not present

## 2022-09-16 DIAGNOSIS — J449 Chronic obstructive pulmonary disease, unspecified: Secondary | ICD-10-CM | POA: Diagnosis present

## 2022-09-16 DIAGNOSIS — Z8719 Personal history of other diseases of the digestive system: Secondary | ICD-10-CM

## 2022-09-16 DIAGNOSIS — R1011 Right upper quadrant pain: Secondary | ICD-10-CM | POA: Diagnosis not present

## 2022-09-16 DIAGNOSIS — R609 Edema, unspecified: Secondary | ICD-10-CM | POA: Diagnosis not present

## 2022-09-16 DIAGNOSIS — D1803 Hemangioma of intra-abdominal structures: Secondary | ICD-10-CM | POA: Diagnosis not present

## 2022-09-16 DIAGNOSIS — I330 Acute and subacute infective endocarditis: Secondary | ICD-10-CM | POA: Diagnosis not present

## 2022-09-16 DIAGNOSIS — Z87442 Personal history of urinary calculi: Secondary | ICD-10-CM

## 2022-09-16 DIAGNOSIS — I2510 Atherosclerotic heart disease of native coronary artery without angina pectoris: Secondary | ICD-10-CM | POA: Diagnosis present

## 2022-09-16 DIAGNOSIS — I2699 Other pulmonary embolism without acute cor pulmonale: Secondary | ICD-10-CM

## 2022-09-16 DIAGNOSIS — Z818 Family history of other mental and behavioral disorders: Secondary | ICD-10-CM

## 2022-09-16 DIAGNOSIS — Z955 Presence of coronary angioplasty implant and graft: Secondary | ICD-10-CM

## 2022-09-16 DIAGNOSIS — Z7901 Long term (current) use of anticoagulants: Secondary | ICD-10-CM

## 2022-09-16 DIAGNOSIS — F1010 Alcohol abuse, uncomplicated: Secondary | ICD-10-CM | POA: Diagnosis not present

## 2022-09-16 DIAGNOSIS — J690 Pneumonitis due to inhalation of food and vomit: Secondary | ICD-10-CM | POA: Diagnosis present

## 2022-09-16 DIAGNOSIS — I119 Hypertensive heart disease without heart failure: Secondary | ICD-10-CM | POA: Diagnosis present

## 2022-09-16 DIAGNOSIS — J301 Allergic rhinitis due to pollen: Secondary | ICD-10-CM | POA: Diagnosis not present

## 2022-09-16 DIAGNOSIS — Z5181 Encounter for therapeutic drug level monitoring: Secondary | ICD-10-CM | POA: Diagnosis not present

## 2022-09-16 DIAGNOSIS — D751 Secondary polycythemia: Secondary | ICD-10-CM | POA: Diagnosis present

## 2022-09-16 DIAGNOSIS — Z79899 Other long term (current) drug therapy: Secondary | ICD-10-CM

## 2022-09-16 DIAGNOSIS — Z888 Allergy status to other drugs, medicaments and biological substances status: Secondary | ICD-10-CM

## 2022-09-16 DIAGNOSIS — R0989 Other specified symptoms and signs involving the circulatory and respiratory systems: Secondary | ICD-10-CM | POA: Diagnosis not present

## 2022-09-16 DIAGNOSIS — F411 Generalized anxiety disorder: Secondary | ICD-10-CM | POA: Diagnosis not present

## 2022-09-16 DIAGNOSIS — M1990 Unspecified osteoarthritis, unspecified site: Secondary | ICD-10-CM | POA: Diagnosis present

## 2022-09-16 DIAGNOSIS — J90 Pleural effusion, not elsewhere classified: Secondary | ICD-10-CM | POA: Diagnosis not present

## 2022-09-16 DIAGNOSIS — K219 Gastro-esophageal reflux disease without esophagitis: Secondary | ICD-10-CM | POA: Diagnosis present

## 2022-09-16 DIAGNOSIS — Z9842 Cataract extraction status, left eye: Secondary | ICD-10-CM

## 2022-09-16 DIAGNOSIS — J9601 Acute respiratory failure with hypoxia: Secondary | ICD-10-CM | POA: Diagnosis present

## 2022-09-16 DIAGNOSIS — E785 Hyperlipidemia, unspecified: Secondary | ICD-10-CM | POA: Diagnosis present

## 2022-09-16 DIAGNOSIS — R1031 Right lower quadrant pain: Secondary | ICD-10-CM | POA: Diagnosis not present

## 2022-09-16 DIAGNOSIS — E782 Mixed hyperlipidemia: Secondary | ICD-10-CM | POA: Diagnosis not present

## 2022-09-16 HISTORY — PX: IR CHOLANGIOGRAM EXISTING TUBE: IMG6040

## 2022-09-16 LAB — URINALYSIS, ROUTINE W REFLEX MICROSCOPIC
Bilirubin Urine: NEGATIVE
Glucose, UA: NEGATIVE mg/dL
Hgb urine dipstick: NEGATIVE
Ketones, ur: NEGATIVE mg/dL
Leukocytes,Ua: NEGATIVE
Nitrite: NEGATIVE
Protein, ur: NEGATIVE mg/dL
Specific Gravity, Urine: 1.01 (ref 1.005–1.030)
pH: 6.5 (ref 5.0–8.0)

## 2022-09-16 LAB — CBC
HCT: 48.1 % (ref 39.0–52.0)
Hemoglobin: 15.8 g/dL (ref 13.0–17.0)
MCH: 30.9 pg (ref 26.0–34.0)
MCHC: 32.8 g/dL (ref 30.0–36.0)
MCV: 94.1 fL (ref 80.0–100.0)
Platelets: 200 10*3/uL (ref 150–400)
RBC: 5.11 MIL/uL (ref 4.22–5.81)
RDW: 13.9 % (ref 11.5–15.5)
WBC: 14.7 10*3/uL — ABNORMAL HIGH (ref 4.0–10.5)
nRBC: 0 % (ref 0.0–0.2)

## 2022-09-16 LAB — COMPREHENSIVE METABOLIC PANEL
ALT: 29 U/L (ref 0–44)
AST: 25 U/L (ref 15–41)
Albumin: 3.7 g/dL (ref 3.5–5.0)
Alkaline Phosphatase: 67 U/L (ref 38–126)
Anion gap: 12 (ref 5–15)
BUN: 12 mg/dL (ref 8–23)
CO2: 24 mmol/L (ref 22–32)
Calcium: 9 mg/dL (ref 8.9–10.3)
Chloride: 99 mmol/L (ref 98–111)
Creatinine, Ser: 1.05 mg/dL (ref 0.61–1.24)
GFR, Estimated: >60 mL/min (ref >60)
Glucose, Bld: 118 mg/dL — ABNORMAL HIGH (ref 70–99)
Potassium: 4.4 mmol/L (ref 3.5–5.1)
Sodium: 135 mmol/L (ref 135–145)
Total Bilirubin: 0.8 mg/dL (ref 0.3–1.2)
Total Protein: 7.6 g/dL (ref 6.5–8.1)

## 2022-09-16 LAB — LIPASE, BLOOD: Lipase: 66 U/L — ABNORMAL HIGH (ref 11–51)

## 2022-09-16 LAB — LACTIC ACID, PLASMA: Lactic Acid, Venous: 2 mmol/L (ref 0.5–1.9)

## 2022-09-16 MED ORDER — IOHEXOL 300 MG/ML  SOLN
50.0000 mL | Freq: Once | INTRAMUSCULAR | Status: AC | PRN
  Administered 2022-09-16: 5 mL

## 2022-09-16 MED ORDER — VANCOMYCIN HCL IN DEXTROSE 1-5 GM/200ML-% IV SOLN
1000.0000 mg | Freq: Once | INTRAVENOUS | Status: AC
  Administered 2022-09-16: 1000 mg via INTRAVENOUS
  Filled 2022-09-16: qty 200

## 2022-09-16 MED ORDER — VANCOMYCIN HCL 500 MG IV SOLR
500.0000 mg | Freq: Once | INTRAVENOUS | Status: AC
  Administered 2022-09-17: 500 mg via INTRAVENOUS

## 2022-09-16 MED ORDER — SODIUM CHLORIDE 0.9 % IV BOLUS
500.0000 mL | Freq: Once | INTRAVENOUS | Status: AC
  Administered 2022-09-16: 500 mL via INTRAVENOUS

## 2022-09-16 MED ORDER — FENTANYL CITRATE PF 50 MCG/ML IJ SOSY
50.0000 ug | PREFILLED_SYRINGE | INTRAMUSCULAR | Status: AC | PRN
  Administered 2022-09-16 (×2): 50 ug via INTRAVENOUS
  Filled 2022-09-16 (×2): qty 1

## 2022-09-16 MED ORDER — ONDANSETRON HCL 4 MG/2ML IJ SOLN
4.0000 mg | Freq: Once | INTRAMUSCULAR | Status: AC
  Administered 2022-09-16: 4 mg via INTRAVENOUS
  Filled 2022-09-16: qty 2

## 2022-09-16 MED ORDER — VANCOMYCIN HCL IN DEXTROSE 1-5 GM/200ML-% IV SOLN
1000.0000 mg | Freq: Two times a day (BID) | INTRAVENOUS | Status: DC
  Administered 2022-09-17: 1000 mg via INTRAVENOUS
  Filled 2022-09-16: qty 200

## 2022-09-16 MED ORDER — ONDANSETRON HCL 4 MG/2ML IJ SOLN
4.0000 mg | Freq: Once | INTRAMUSCULAR | Status: AC | PRN
  Administered 2022-09-16: 4 mg via INTRAVENOUS
  Filled 2022-09-16: qty 2

## 2022-09-16 MED ORDER — SODIUM CHLORIDE 0.9 % IV SOLN
2.0000 g | Freq: Three times a day (TID) | INTRAVENOUS | Status: DC
  Administered 2022-09-16 – 2022-09-18 (×5): 2 g via INTRAVENOUS
  Filled 2022-09-16 (×6): qty 12.5

## 2022-09-16 MED ORDER — IOHEXOL 350 MG/ML SOLN
100.0000 mL | Freq: Once | INTRAVENOUS | Status: AC | PRN
  Administered 2022-09-16: 100 mL via INTRAVENOUS

## 2022-09-16 MED ORDER — MORPHINE SULFATE (PF) 4 MG/ML IV SOLN
4.0000 mg | Freq: Once | INTRAVENOUS | Status: AC
  Administered 2022-09-16: 4 mg via INTRAVENOUS
  Filled 2022-09-16: qty 1

## 2022-09-16 NOTE — Progress Notes (Signed)
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 Cardiology (Cardiology)  This Provider for this visit: Treatment Team:  Attending Provider: Kalman Shan, MD    09/16/2022 -   Chief Complaint  Patient presents with   Hospitalization Follow-up    Blood clot in lung, not feeling good, Gallbladder pain.    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  2 antihypertensives had to be stopped -Stable  high-resolution CT chest April 2023 compared to February 2022  High risk prescription of pirfenidone  - Esbriet/Pirfenidone requires intensive drug monitoring due to high concerns for Adverse effects of , including  Drug Induced Liver Injury, significant GI side effects that include but not limited to Diarrhea, Nausea, Vomiting,  and other system side effects that include Fatigue, headaches, weight loss and other side effects such as skin rash. These will be monitored with  blood work such as LFT initially once a month for 6 months and then quarterly    Mild associated emphysema  -Noticed in April 2023 high-resolution CT chest and started on Stiolto  Atrial flutter status post ablation 2017  -On Xarelto  Grade 1 diastolic dysfunction  -Echocardiogram Sept 2023 (no PASP)   Coronary artery disease  -On GDMT treatment  OSA CPAP pending end of 2022   Mild gait unsteadiness due to spinal issues     - uses cane  Long-term turmeric use  Lung nodules  - feb 2022:L Three scattered solid basilar left lower lobe pulmonary nodules, largest 7 mm posteromedially.   -CT chest April 2023 describes this as 4 mm subpleural  nodules.   History of COVID-19 Memorial Day weekend 2023 Admission early June 2023 with a syncope  -Antviral Paxlovid considered as an etiology and dehydrtion  -MRI negative, EEG unremarkable  EKG with bifascicular block  -Cleared by neurology September 15, 2021 outpatient  -ZIO monitor placed June 2023 wounds.\  - QtcF 04/20/2022  Prolonged Qtc >  - QTcF - 04/20/2022  - QTc 478 msec on EKG June 2024 personally visualzied and itnerpreted   Admission 08/28/2022 - 09/01/2022  -Small subsegmental pulmonary embolism treatment with anticoagulation  -Acute cholecystitis status post cholecystostomy  Depressive disorder, generalized anxiety disorder and insomnia  -Under the care of Avelina Laine nurse practitioner psychiatry  -On Zoloft, trazodone, BuSpar,  Lamictal, lorazepam, Wellbutrin  HPI Robert Lang 72 y.o. -returns for post hospital follow-up.  This is an unscheduled visit otherwise.  His pulm fibrosis itself is stable.  He presents with his wife.  Wife is an independent historian.  History is also gained from review of the medical records especially external medical record related to hospitalization 08/28/2022 - 09/01/2022.  He had dual problems of subsegmental pulmonary embolism and also acute cholecystitis.  Discharged on Eliquis.  Also status post gallbladder drain.  He was on oxygen but he is now off oxygen.  He is getting home physical therapy.  He is upset about his illness but he feels he is gaining strength.  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  He is tolerating high-dose prescription of pirfenidone that requires intensive therapeutic monitoring well.  He had blood work 09/13/2022.  Liver function test was normal.  Hemoglobin and kidney function are normal.  Also visualized the CT scan of the chest from August 29, 2022: Agree with the pulmonary embolism and I also feel that the pulmonary fibrosis itself is stable.  With my personal independent of interpretation.    Other issues - Robert Lang did see psychiatry on 09/14/2022.  His BuSpar was increased.  Other medications noted above but continued.  -                SYMPTOM SCALE - ILD 06/17/2020   07/28/2020   09/09/2020 184# 11/18/2020 185#  Esbriet 801mg  BID 12/19/2020   Esbriet 801mg  TID  02/19/2021 187# 06/23/2021 180# 07/14/2021 Esbriet 801mg  TID  09/24/2021 188# - esbreit and also stiolto 01/07/2022 206# 04/20/2022 200# esbriet  O2 use ra RA     RA   ra0 RA  ra ra ra  Shortness of Breath 0 -> 5 scale with 5 being worst (score 6 If unable to do) 0-5             At rest 3 0.5 2 4 3   0 3 2 3 3   Simple tasks - showers, clothes change, eating, shaving 5 3 3 3 2  3 3 4 4 2   Household (dishes, doing bed, laundry) 5 3 (he  does not do these tasks) 5 0 3 (he does not do these tasks)  0 NA 4 5 2   Shopping 5 3 (he does not do this often) 3 1 3  (he does not do these tasks)  4 3.5 (uses scooter) 4 5 2   Walking level at own pace 4 3 3 1 3  5 3 3 3 2   Walking up Stairs 5 5 4 1 3  5  3.5 4 5 2   Total (30-36) Dyspnea Score 27 17.5 20 10 17  17  16  21 25 13   How bad is your cough? 3 2 1  fair 1  0 1 2 2 2   How bad is your fatigue 4 2 4  fair 3  3 4 3 5 2   How bad is nausea 0 0 3 good 2  3 2 2 2  0  How bad is vomiting?   0 0 0 good 0  0 0 0 0 0  How bad is diarrhea? 3 0 0 good 0  0 0 1 2 0  How bad is anxiety? 5 5 3  fair 2  5 5 3 5 3   How bad is depression 5 5 3  faor 3  3 4 3 4 3          Simple office walk 185 feet x  3 laps goal with forehead probe 06/23/2021  09/24/2021  01/07/2022   O2 used ra ra ra  Number laps completed 2 of 3 Did all 3 withe cane Did all 3 with cane  Comments about pace Not done Slow pace with cane Slow pace - stopped 2-3 times in fist lap  Resting Pulse Ox/HR 98% and 97/min 99% and HR 102 99% and HR 86  Final Pulse Ox/HR 97% and 101/min 99% and HR 112 98% and HR 100  Desaturated </= 88% no no no  Desaturated <= 3% points no no no  Got Tachycardic >/= 90/min yes Ye even at rest yes  Symptoms at end of test Not rcorded Modrate dyspnea Moderate to severe dyspnea  Miscellaneous comments x       Simple office walk 224 (66+46 x 2) feet Pod A at [facility] x  3 laps goal with forehead probe 09/16/2022    O2 used ra   Number laps completed Sit stand x 5 with cane   Comments about pace stead   Resting Pulse Ox/HR 93% and 102/min   Final Pulse Ox/HR 92% and 109/min   Desaturated </= 88% no   Desaturated <= 3% points no   Got Tachycardic >/= 90/min yes   Symptoms at end of test x   Miscellaneous comments x      PFT     Latest Ref Rng & Units 04/20/2022    9:18 AM 01/07/2022    8:30 AM 06/23/2021    9:23 AM 02/19/2021   11:47 AM 11/18/2020    9:04 AM 05/09/2020    8:46 AM  PFT Results   FVC-Pre L 3.11  3.05  3.48  3.49  3.33  3.21   FVC-Predicted Pre % 68  66  76  75  72  69   FVC-Post L      3.38   FVC-Predicted Post %      73   Pre FEV1/FVC % % 75  73  76  73  79  81   Post FEV1/FCV % %      76   FEV1-Pre L 2.34  2.21  2.64  2.56  2.63  2.60   FEV1-Predicted Pre % 70  66  79  75  77  76   FEV1-Post L      2.55   DLCO uncorrected ml/min/mmHg 17.01  16.62  17.94  20.29  19.95  22.07   DLCO UNC% % 64  62  67  75  74  82   DLCO corrected ml/min/mmHg 17.01  16.62  17.94  20.29  19.95  21.66   DLCO COR %Predicted % 64  62  67  75  74  81   DLVA Predicted % 74  74  79  81  79  100   TLC L      7.61   TLC % Predicted %      105   RV % Predicted %      139        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), Allergic rhinitis due to pollen (09/03/2013), Allergy, Anticoagulated (07/01/2016), Anxiety, Arthritis, Atrial fibrillation (HCC), Atrial flutter (HCC) (06/26/2015), CAD (coronary artery disease), Chest pain (06/26/2015), COPD (chronic obstructive pulmonary disease) (HCC), Coronary artery disease (07/06/2018), DDD (degenerative disc disease), cervical (09/03/2013), Depression, Depression, Depressive disorder (09/03/2013), Dizziness (10/28/2017), Dyslipidemia, goal LDL below 70 (07/06/2018), Dyspnea on exertion (10/20/2018), Esophageal reflux (09/03/2013), Essential hypertension (07/06/2018), ETOH abuse (01/11/2019), Falls (10/28/2017), GERD (gastroesophageal reflux disease), H/O amiodarone therapy (07/01/2016), Heart attack (HCC), Heart palpitations (01/27/2017), History of colon polyps, Hyperlipidemia, Hypertension, IPF (idiopathic pulmonary fibrosis) (HCC), Kidney stones, Neck pain (09/03/2013), Neuropathy (07/06/2018), Obstructive sleep apnea (08/05/2015), PLMD (periodic limb movement disorder) (11/04/2015), Polycythemia, secondary (09/03/2013), Pure  hypercholesterolemia (09/03/2013), Screening for prostate cancer (09/03/2013), Smoker (01/11/2019), Smoking (07/06/2018), and 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   reports that he quit smoking about 3 years ago. His smoking use included cigarettes. He has a 30.00 pack-year smoking history. He has quit using smokeless tobacco.  Past Surgical History:  Procedure Laterality Date   ATRIAL FIBRILLATION ABLATION  10/2015   BACK SURGERY     CARDIOVERSION  2017   CATARACT EXTRACTION Bilateral 2017   March and April 2017   COLONOSCOPY  2018   CORONARY STENT PLACEMENT  2012   CORONARY/GRAFT ACUTE MI REVASCULARIZATION N/A 01/10/2019   Procedure: CORONARY/GRAFT ACUTE MI REVASCULARIZATION;  Surgeon: Marykay Lex, MD;  Location: [facility] INVASIVE CV LAB;  Service: Cardiovascular;  Laterality: N/A;   INGUINAL HERNIA REPAIR     over 20 years ago   IR PERC CHOLECYSTOSTOMY  08/30/2022   LAPAROSCOPIC APPENDECTOMY N/A 01/17/2021   Procedure: APPENDECTOMY LAPAROSCOPIC;  Surgeon: Quentin Ore, MD;  Location: [facility] OR;  Service: General;  Laterality: N/A;   LEFT HEART CATH AND CORONARY ANGIOGRAPHY N/A 01/10/2019   Procedure: LEFT HEART CATH AND CORONARY ANGIOGRAPHY;  Surgeon: Marykay Lex, MD;  Location: [facility] INVASIVE CV LAB;  Service: Cardiovascular;  Laterality: N/A;   LUMBAR LAMINECTOMY Bilateral 04/05/2016   L2-L5    VENTRAL HERNIA REPAIR  2018    Allergies  Allergen Reactions   Naproxen Other (See Comments)    (Naprosyn *ANALGESICS - ANTI-INFLAMMATORY*) Nausea, Abdominal pain   Silodosin Other (See Comments)    Low blood pressure    Immunization History  Administered Date(s) Administered   Influenza Split 01/09/2010, 02/09/2011   Influenza, High Dose Seasonal PF 11/15/2018, 01/11/2020, 12/27/2020, 12/24/2021   Influenza,inj,Quad PF,6+ Mos 01/01/2015   Influenza-Unspecified 01/30/2014, 02/02/2016, 12/15/2020   PFIZER(Purple Top)SARS-COV-2 Vaccination 04/27/2019, 05/18/2019,  01/14/2020, 07/22/2020, 12/15/2020, 12/24/2021   Pneumococcal Conjugate-13 11/23/2018   Pneumococcal Polysaccharide-23 08/12/2020   Respiratory Syncytial Virus Vaccine,Recomb Aduvanted(Arexvy) 12/17/2021   Tdap 02/18/2011    Family History  Problem Relation Age of Onset   Anxiety disorder Mother    Alcohol abuse Father    Colon cancer Father    Depression Brother    Alcohol abuse Brother    Throat cancer Brother      Current Outpatient Medications:    apixaban (ELIQUIS) 5 MG TABS tablet, Take 1 tablet (  5 mg total) by mouth 2 (two) times daily., Disp: 60 tablet, Rfl: 0   APIXABAN (ELIQUIS) VTE STARTER PACK (10MG  AND 5MG ), Take 2 tablets (10 mg total) by mouth 2 (two) times daily for 7 days. THEN take 1 tablet (5 mg total) by mouth twice a day. BEGIN WITH EVENING DOSE OF DAY 1 OF STARTER PACK, Disp: 74 tablet, Rfl: 0   atorvastatin (LIPITOR) 80 MG tablet, Take 1 tablet (80 mg total) by mouth daily., Disp: 90 tablet, Rfl: 2   buPROPion (WELLBUTRIN XL) 300 MG 24 hr tablet, Take 1 tablet (300 mg total) by mouth daily., Disp: 90 tablet, Rfl: 1   busPIRone (BUSPAR) 15 MG tablet, Take 1 tablet (15 mg total) by mouth 3 (three) times daily., Disp: 90 tablet, Rfl: 3   Cholecalciferol 25 MCG (1000 UT) tablet, Take 1,000 Units by mouth daily. , Disp: , Rfl:    docusate sodium (COLACE) 100 MG capsule, Take 100 mg by mouth daily., Disp: , Rfl:    famotidine (PEPCID) 40 MG tablet, TAKE 1 TABLET(40 MG) BY MOUTH AT BEDTIME (Patient taking differently: Take 40 mg by mouth at bedtime.), Disp: 90 tablet, Rfl: 2   fexofenadine (ALLEGRA) 180 MG tablet, Take 180 mg by mouth at bedtime. , Disp: , Rfl:    finasteride (PROSCAR) 5 MG tablet, Take 1 tablet (5 mg total) by mouth daily., Disp: 90 tablet, Rfl: 1   Ginger, Zingiber officinalis, (GINGER ROOT) 550 MG CAPS, Take 550 mg by mouth daily., Disp: , Rfl:    lamoTRIgine (LAMICTAL) 100 MG tablet, Take 1 tablet (100 mg total) by mouth daily., Disp: 30 tablet, Rfl:  1   lamoTRIgine (LAMICTAL) 25 MG tablet, Take one tablet by mouth 25 mg daily for 14 days, then take two tablets 50 mg total daily. (Patient taking differently: Take 25 mg by mouth daily. started 25mg  08/22/22), Disp: 60 tablet, Rfl: 1   Lidocaine 4 % LOTN, Use as needed in the bilateral lower extremities (Patient taking differently: Apply 1 application  topically as needed (LE pain). Use as needed in the bilateral lower extremities), Disp: 88 mL, Rfl: 0   LORazepam (ATIVAN) 0.5 MG tablet, Take one tablet by mouth only for severe anxiety or agitation (Patient taking differently: Take 0.5 mg by mouth daily as needed for anxiety. Take one tablet by mouth only for severe anxiety or agitation. Has not taking medication yet.), Disp: 30 tablet, Rfl: 1   melatonin 5 MG TABS, Take 10 mg by mouth at bedtime., Disp: , Rfl:    Multiple Vitamins-Minerals (PRESERVISION AREDS PO), Take 1 capsule by mouth in the morning and at bedtime., Disp: , Rfl:    nitroGLYCERIN (NITROSTAT) 0.4 MG SL tablet, Place 1 tablet (0.4 mg total) under the tongue every 5 (five) minutes as needed for chest pain., Disp: 25 tablet, Rfl: 3   ondansetron (ZOFRAN) 4 MG tablet, Take 1 tablet (4 mg total) by mouth every 8 (eight) hours as needed for nausea or vomiting., Disp: 20 tablet, Rfl: 0   pantoprazole (PROTONIX) 40 MG tablet, TAKE 1 TABLET(40 MG) BY MOUTH DAILY (Patient taking differently: Take 40 mg by mouth daily.), Disp: 90 tablet, Rfl: 1   Pirfenidone (ESBRIET) 801 MG TABS, Take 1 tablet (801 mg total) by mouth 3 (three) times daily., Disp: 270 tablet, Rfl: 1   Polyethylene Glycol 3350 (MIRALAX PO), Take 17 g by mouth daily., Disp: , Rfl:    pregabalin (LYRICA) 150 MG capsule, Take 1 capsule (150 mg total)  by mouth 2 (two) times daily., Disp: 90 capsule, Rfl: 5   ranolazine (RANEXA) 1000 MG SR tablet, Take 1 tablet (1,000 mg total) by mouth 2 (two) times daily., Disp: 180 tablet, Rfl: 1   sertraline (ZOLOFT) 100 MG tablet, Take 1.5  tablets (150 mg total) by mouth at bedtime., Disp: 135 tablet, Rfl: 1   Sodium Chloride Flush (NORMAL SALINE FLUSH) 0.9 % SOLN, Flush drain with 5 mLs daily. Discard remaining 5ml in each syringe., Disp: 200 mL, Rfl: 0   traZODone (DESYREL) 50 MG tablet, Take 1 tablet (50 mg total) by mouth at bedtime as needed for sleep (May take 1/2 tab to 1 tablet for sleep, as needed.)., Disp: 30 tablet, Rfl: 3   umeclidinium-vilanterol (ANORO ELLIPTA) 62.5-25 MCG/ACT AEPB, Inhale 1 puff into the lungs daily., Disp: 60 each, Rfl: 0   vitamin B-12 (CYANOCOBALAMIN) 1000 MCG tablet, Take 1,000 mcg by mouth daily., Disp: , Rfl:    Vitamin D, Ergocalciferol, (DRISDOL) 1.25 MG (50000 UNIT) CAPS capsule, Take 1 capsule (50,000 Units total) by mouth every 7 (seven) days., Disp: 8 capsule, Rfl: 0   zolpidem (AMBIEN) 5 MG tablet, Take 1 tablet (5 mg total) by mouth at bedtime as needed for sleep., Disp: 30 tablet, Rfl: 5      Objective:   Vitals:   09/16/22 1117  BP: 100/60  Pulse: (!) 108  SpO2: 91%  Weight: 197 lb 12.8 oz (89.7 kg)  Height: 5\' 11"  (1.803 m)    Estimated body mass index is 27.59 kg/m as calculated from the following:   Height as of this encounter: 5\' 11"  (1.803 m).   Weight as of this encounter: 197 lb 12.8 oz (89.7 kg).  @WEIGHTCHANGE @  [employer]   09/16/22 1117  Weight: 197 lb 12.8 oz (89.7 kg)     Physical Exam   General: No distress.  Flat affect has a cane O2 at rest: no Cane present: no Sitting in wheel chair: no Frail: no Obese: no Neuro: Alert and Oriented x 3. GCS 15. Speech normal Psych: Pleasant Resp:  Barrel Chest - no.  Wheeze - no, Crackles - YES, No overt respiratory distress CVS: Normal heart sounds. Murmurs - no Ext: Stigmata of Connective Tissue Disease - no HEENT: Normal upper airway. PEERL +. No post nasal drip        Assessment:       ICD-10-CM   1. IPF (idiopathic pulmonary fibrosis) (HCC)  J84.112     2. Medication monitoring encounter   Z51.81     3. History of pulmonary embolism  Z86.711     4. History of acute cholecystitis  Z87.19          Plan:     Patient Instructions  IPF (idiopathic pulmonary fibrosis) (HCC) Medication monitoring encounter  -Pulmonary fibrosis itself appears to be stable since December 2022 based on pulmonary function test and symptoms..  Overall tolerating Esbriet well.  LFT normal September 13, 2022  plan - continue esbriet per schedule - -Recheck spirometry and DLCO in 3 months  Pulmonary embolism small June 2024 requirng o2 in hospital  - now off o2  Plan  -cotninue eliquis; duration likely 1+ years  Acute cholecystisis s/p drain  - today drain seems non-functiona  Plan   - per IR  -Follow-up - 3 months Dr Marchelle Gearing but after PFT; symptom score and walk at followup  -30-minute visit    SIGNATURE    Dr. Kalman Shan, M.D., F.C.C.P,  Pulmonary and  Critical Care Medicine Staff Physician, [facility] Director - Interstitial Lung Disease  Program  Pulmonary Fibrosis [facility] at [facility] [place], [place], [address]  Pager: [phone], If no answer or between  15:00h - 7:00h: call [phone] Telephone: [phone]  11:52 AM 09/16/2022     HIGh Complexity  OFFICE   2021 E/M guidelines, first released in 2021, with minor revisions added in 2023. Must meet the requirements for 2 out of 3 dimensions to qualify.    Number and complexity of problems addressed Amount and/or complexity of data reviewed Risk of complications and/or morbidity  Severe exacerbation of chronic illness  Acute or chronic illnesses that may pose a threat to life or bodily function, e.g., multiple trauma, acute MI, pulmonary embolus, severe respiratory distress, progressive rheumatoid arthritis, psychiatric illness with potential threat to self or others, peritonitis, acute renal failure, abrupt change in neurological status Must meet the  requirements for 2 of 3 of the categories)  Category 1: Tests and documents, historian  Any combination of 3 of the following:  Assessment requiring an independent historian w- wife  Review of prior external note(s) from each unique source - dc summary  Review of results of each unique test - blood work,   Ordering of each unique test    PFT    Category 2: Interpretation of tests    Independent interpretation of a test performed by another physician/other qualified health care professional (not separately reported) - cTA August 29 2202  Category 3: Discuss management/tests  Discussion of management or test interpretation with external physician/other qualified health care professional/appropriate source (not separately reported)  HIGH risk of morbidity from additional diagnostic testing or treatment Examples only:  Drug therapy requiring intensive monitoring for toxicity  Decision for elective major surgery with identified pateint or procedure risk factors  Decision regarding hospitalization or escalation of level of care  Decision for DNR or to de-escalate care   Parenteral controlled  substances

## 2022-09-16 NOTE — ED Notes (Signed)
ED TO INPATIENT HANDOFF REPORT  ED Nurse Name and Phone #: [place] Neesa Knapik on secure chat (or you can call the main line for [facility])  S Name/Age/Gender Robert Lang 72 y.o. male Room/Bed: MH03/MH03  Code Status   Code Status: Prior  Home/SNF/Other Home Patient oriented to: self, place, time, and situation Is this baseline? Yes   Triage Complete: Triage complete  Chief Complaint Acute respiratory failure with hypoxia (HCC) [J96.01]  Triage Note Pt having severe abdominal pain since last night.  [facility] but was unable to have surgery due to PE.  Pt had drain placed and was to have surgery once PE has resolved.  Drain was not draining well for a few days.  Went today to have drained replaced at [facility].  Pt is now having worsening of abdominal pain and chills.     Allergies Allergies  Allergen Reactions   Naproxen Other (See Comments)    (Naprosyn *ANALGESICS - ANTI-INFLAMMATORY*) Nausea, Abdominal pain   Silodosin Other (See Comments)    Low blood pressure    Level of Care/Admitting Diagnosis ED Disposition     ED Disposition  Admit   Condition  --   Comment  Hospital Area: [facility] [[id number]]  Level of Care: Telemetry [5]  Admit to tele based on following criteria: Monitor QTC interval  May admit patient to [facility] or [facility] if equivalent level of care is available:: Yes  Interfacility transfer: Yes  Covid Evaluation: Asymptomatic - no recent exposure (last 10 days) testing not required  Diagnosis: Acute respiratory failure with hypoxia [facility]) [[id number]]  Admitting Physician: Briscoe Deutscher [[id number]]  Attending Physician: Briscoe Deutscher [[id number]]  Certification:: I certify this patient will need inpatient services for at least 2 midnights  Estimated Length of Stay: 3          B Medical/Surgery History Past Medical History:  Diagnosis Date   Acute ST elevation myocardial infarction  (STEMI) of inferolateral wall (HCC) 01/10/2019   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 M75.00)   Allergic rhinitis due to pollen 09/03/2013   J30.1)  Formatting of this note might be different from the original. J30.1)   Allergy    Anticoagulated 07/01/2016   Anxiety    Arthritis    Atrial fibrillation (HCC)    Atrial flutter (HCC) 06/26/2015   CAD (coronary artery disease)    Chest pain 06/26/2015   COPD (chronic obstructive pulmonary disease) (HCC)    Coronary artery disease 07/06/2018   Cardiac catheterization 2017 showing 90% small diagonal branch disease   DDD (degenerative disc disease), cervical 09/03/2013   M50.90)  Formatting of this note might be different from the original. M50.90)   Depression    Depression    Depressive disorder 09/03/2013   Dizziness 10/28/2017   Dyslipidemia, goal LDL below 70 07/06/2018   Dyspnea on exertion 10/20/2018   Esophageal reflux 09/03/2013   Essential hypertension 07/06/2018   ETOH abuse 01/11/2019   6 pack of beer per day   Falls 10/28/2017   GERD (gastroesophageal reflux disease)    H/O amiodarone therapy 07/01/2016   Heart attack (HCC)    Heart palpitations 01/27/2017   History of colon polyps    Hyperlipidemia    Hypertension    IPF (idiopathic pulmonary fibrosis) (HCC)    Kidney stones    Neck pain 09/03/2013   Neuropathy  07/06/2018   Obstructive sleep apnea 08/05/2015   PLMD (periodic limb movement disorder) 11/04/2015   Polycythemia, 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 STORY: Due to alcohol/ tobacco   Pure hypercholesterolemia 09/03/2013   E78.0)  Formatting of this note might be different from the original. E78.0)   Screening for prostate cancer 09/03/2013   Smoker 01/11/2019   Smoking 07/06/2018   Status post ablation of atrial flutter 07/06/2018   2017    Past Surgical History:  Procedure Laterality Date   ATRIAL FIBRILLATION ABLATION  10/2015   BACK SURGERY     CARDIOVERSION  2017   CATARACT EXTRACTION Bilateral 2017   March and April 2017   COLONOSCOPY  2018   CORONARY STENT PLACEMENT  2012   CORONARY/GRAFT ACUTE MI REVASCULARIZATION N/A 01/10/2019   Procedure: CORONARY/GRAFT ACUTE MI REVASCULARIZATION;  Surgeon: Marykay Lex, MD;  Location: [facility] INVASIVE CV LAB;  Service: Cardiovascular;  Laterality: N/A;   INGUINAL HERNIA REPAIR     over 20 years ago   IR CHOLANGIOGRAM EXISTING TUBE  09/16/2022   IR PERC CHOLECYSTOSTOMY  08/30/2022   LAPAROSCOPIC APPENDECTOMY N/A 01/17/2021   Procedure: APPENDECTOMY LAPAROSCOPIC;  Surgeon: Quentin Ore, MD;  Location: [facility] OR;  Service: General;  Laterality: N/A;   LEFT HEART CATH AND CORONARY ANGIOGRAPHY N/A 01/10/2019   Procedure: LEFT HEART CATH AND CORONARY ANGIOGRAPHY;  Surgeon: Marykay Lex, MD;  Location: [facility] INVASIVE CV LAB;  Service: Cardiovascular;  Laterality: N/A;   LUMBAR LAMINECTOMY Bilateral 04/05/2016   L2-L5    VENTRAL HERNIA REPAIR  2018     A IV Location/Drains/Wounds Patient Lines/Drains/Airways Status     Active Line/Drains/Airways     Name Placement date Placement time Site Days   Peripheral IV 20 G Anterior;Distal;Right;Upper Arm --  --  Arm  --   Peripheral IV 09/16/22 20 G 1" Anterior;Left;Proximal Forearm 09/16/22  2102  Forearm  less than 1   Closed System Drain 1 Midline Abdomen Bulb (JP) 19 Fr. 01/17/21  1137  Abdomen  607   Biliary Tube Cook slip-coat 10.2 Fr. RUQ 08/30/22  1645  RUQ  17   Incision - 2 Ports Abdomen 1: Umbilicus 2: Lower;Left 01/17/21  1148  -- 607            Intake/Output Last 24 hours  Intake/Output Summary (Last 24 hours) at 09/16/2022 2325 Last data filed at 09/16/2022 2133 Gross per 24 hour  Intake 501.38 ml  Output --  Net 501.38 ml    Labs/Imaging Results for orders placed or performed during the hospital  encounter of 09/16/22 (from the past 48 hour(s))  Lipase, blood     Status: Abnormal   Collection Time: 09/16/22  6:25 PM  Result Value Ref Range   Lipase 66 (H) 11 - 51 U/L    Comment: Performed at [facility], [address] [place], [place] [address]  Comprehensive metabolic panel     Status: Abnormal   Collection Time: 09/16/22  6:25 PM  Result Value Ref Range   Sodium 135 135 - 145 mmol/L   Potassium 4.4 3.5 - 5.1 mmol/L   Chloride 99 98 - 111 mmol/L   CO2 24 22 - 32 mmol/L   Glucose, Bld 118 (H) 70 - 99 mg/dL    Comment: Glucose reference range applies only to samples taken after fasting for at least 8 hours.   BUN  12 8 - 23 mg/dL   Creatinine, Ser 1.61 0.61 - 1.24 mg/dL   Calcium 9.0 8.9 - 09.6 mg/dL   Total Protein 7.6 6.5 - 8.1 g/dL   Albumin 3.7 3.5 - 5.0 g/dL   AST 25 15 - 41 U/L   ALT 29 0 - 44 U/L   Alkaline Phosphatase 67 38 - 126 U/L   Total Bilirubin 0.8 0.3 - 1.2 mg/dL   GFR, Estimated >04 >54 mL/min    Comment: (NOTE) Calculated using the CKD-EPI Creatinine Equation (2021)    Anion gap 12 5 - 15    Comment: Performed at [facility], [address] [place], [place] [address]  CBC     Status: Abnormal   Collection Time: 09/16/22  6:25 PM  Result Value Ref Range   WBC 14.7 (H) 4.0 - 10.5 K/uL   RBC 5.11 4.22 - 5.81 MIL/uL   Hemoglobin 15.8 13.0 - 17.0 g/dL   HCT 91.4 78.2 - 95.6 %   MCV 94.1 80.0 - 100.0 fL   MCH 30.9 26.0 - 34.0 pg   MCHC 32.8 30.0 - 36.0 g/dL   RDW 21.3 08.6 - 57.8 %   Platelets 200 150 - 400 K/uL   nRBC 0.0 0.0 - 0.2 %    Comment: Performed at [facility], [address] [place], [place] [address]  Lactic acid, plasma     Status: Abnormal   Collection Time: 09/16/22  7:33 PM  Result Value Ref Range   Lactic Acid, Venous 2.0 (HH) 0.5 - 1.9 mmol/L    Comment: CRITICAL RESULT CALLED TO, READ BACK BY AND VERIFIED WITH Jaquita Folds RN AT 2036 ON 09/16/22 BY PERRY,J Performed at [facility], [address], [place], [place] [address]    CT Angio Chest PE W and/or Wo Contrast  Result Date: 09/16/2022 CLINICAL DATA:  Pulmonary embolism suspected. High probability. New onset hypoxia and shortness of breath. Also severe abdominal pain. Recently discharged after treatment for small subsegmental arterial emboli in the left lower lobe. EXAM: CT ANGIOGRAPHY CHEST WITH CONTRAST TECHNIQUE: Multidetector CT imaging of the chest was performed using the standard protocol during bolus administration of intravenous contrast. Multiplanar CT image reconstructions and MIPs were obtained to evaluate the vascular anatomy. RADIATION DOSE REDUCTION: This exam was performed according to the departmental dose-optimization program which includes automated exposure control, adjustment of the mA and/or kV according to patient size and/or use of iterative reconstruction technique. CONTRAST:  OMNIPAQUE IOHEXOL 350 MG/ML SOLN COMPARISON:  Portable chest today, CTA chest 08/29/2022, CTA chest 08/25/2021 FINDINGS: Cardiovascular: Previously the patient had scattered subsegmental left lower lobe arterial filling defects. These are no longer seen. There is a small-volume embolic filling defect within a single lingular subsegmental artery on series 2 axial 43 and 44 and also visible on series 5 images 63-65. No further emboli are detected. There is panchamber cardiomegaly with a slight right chamber predominance which was seen previously but no IVC reflux to suspect acute right heart strain. There is no pericardial effusion. There are heavy calcifications in the left main and LAD coronary arteries, additional calcifications and stenting in the right coronary artery and small amount of calcification in the proximal circumflex artery. Lipomatous hypertrophy of the inter-atrial septum is chronically noted as well. The pulmonary veins are nondistended. The aorta and great vessels are tortuous but normal caliber. No  dissection is seen. There is moderate patchy aortic calcific plaque,  less heavy scattered plaque in the great vessels. Mediastinum/Nodes: No enlarged mediastinal, hilar, or axillary lymph nodes. Thyroid gland, trachea, and esophagus demonstrate no significant findings. Lungs/Pleura: Bilateral trace pleural effusions are similar to the last CT. No pneumothorax. There are opacities in both posterior basal lower lobes, noted to varying degrees on both prior studies, bilaterally increased today and could indicate increased atelectasis or developing pneumonia. There is a chronically elevated right hemidiaphragm and mild posterior atelectasis in the upper lobes. The lungs are otherwise clear. No nodules are seen. The main bronchi are patent. There is chronic bronchial thickening in the lower lobes. Upper Abdomen: A percutaneous cholecystostomy drain is noted. The liver is steatotic and mildly enlarged. No other significant findings in the visualized upper abdomen. Musculoskeletal: Osteopenia and degenerative change of the spine. No acute osseous findings. No aggressive regional bone lesion. No chest wall mass is seen. Review of the MIP images confirms the above findings. IMPRESSION: 1. Positive for a single small-volume subsegmental embolus in the lingula. No other emboli are seen. 2. Subsegmental emboli in the left lower lobe noted previously have cleared in the interval. 3. Cardiomegaly without evidence of acute right heart strain or venous dilatation. Chronic slight right chamber predominance. 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 4. Aortic and coronary artery atherosclerosis and lipomatous hypertrophy of the inter-atrial septum. 5. Trace pleural effusions with increased opacities in the posterior basal lower lobes which could be atelectasis or developing pneumonia. 6. Chronic lower lobe bronchial thickening. 7. Percutaneous cholecystostomy drain. 8. Hepatic steatosis and mild hepatomegaly. 9. Osteopenia and  degenerative change. Aortic Atherosclerosis (ICD10-I70.0). Electronically Signed   By: Almira Bar M.D.   On: 09/16/2022 20:53   CT ABDOMEN PELVIS W CONTRAST  Result Date: 09/16/2022 CLINICAL DATA:  Right lower quadrant abdominal pain since yesterday, history of cholecystostomy tube EXAM: CT ABDOMEN AND PELVIS WITH CONTRAST TECHNIQUE: Multidetector CT imaging of the abdomen and pelvis was performed using the standard protocol following bolus administration of intravenous contrast. RADIATION DOSE REDUCTION: This exam was performed according to the departmental dose-optimization program which includes automated exposure control, adjustment of the mA and/or kV according to patient size and/or use of iterative reconstruction technique. CONTRAST:  OMNIPAQUE IOHEXOL 350 MG/ML SOLN COMPARISON:  09/16/2022, 08/29/2022 FINDINGS: Lower chest: Bibasilar hypoventilatory changes. No acute pleural or parenchymal lung disease. Atherosclerosis of the aorta and coronary vasculature. Hepatobiliary: Stable hemangioma right lobe liver. Otherwise the liver is unremarkable. Percutaneous cholecystostomy tube is seen coiled within the gallbladder lumen, with complete decompression of the gallbladder. There is mild pericholecystic fat stranding which may be related history of cholecystitis or indwelling drain. No biliary duct dilation. Pancreas: Unremarkable. No pancreatic ductal dilatation or surrounding inflammatory changes. Spleen: Normal in size without focal abnormality. Adrenals/Urinary Tract: Calcifications of the bilateral renal hila are likely vascular. No urinary tract calculi or obstructive uropathy. Kidneys enhance normally. The adrenals and bladder are unremarkable. Stomach/Bowel: No bowel obstruction or ileus. Diverticulosis of the descending and sigmoid colon without diverticulitis. Prior appendectomy. No bowel wall thickening. Vascular/Lymphatic: Aortic atherosclerosis. No enlarged abdominal or pelvic lymph  nodes. Reproductive: Prostate is unremarkable. Other: No free fluid or free intraperitoneal gas. No abdominal wall hernia. Musculoskeletal: No acute or destructive bony abnormalities. Reconstructed images demonstrate no additional findings. IMPRESSION: 1. Indwelling cholecystostomy tube without evidence of complication. 2. Diverticulosis without diverticulitis. 3.  Aortic Atherosclerosis (ICD10-I70.0). Electronically Signed   By: Sharlet Salina M.D.   On: 09/16/2022 20:36   DG Abdomen 1 View  Result Date: 09/16/2022  CLINICAL DATA:  Abdominal pain and chills. EXAM: ABDOMEN - 1 VIEW COMPARISON:  None Available. FINDINGS: The bowel gas pattern is normal. A percutaneous biliary drainage catheter is seen overlying the right upper quadrant. No radio-opaque calculi or other significant radiographic abnormality are seen. IMPRESSION: Percutaneous biliary drainage catheter overlying the right upper quadrant. Electronically Signed   By: Aram Candela M.D.   On: 09/16/2022 19:30   DG Chest Portable 1 View  Result Date: 09/16/2022 CLINICAL DATA:  Abdominal pain and chills EXAM: PORTABLE CHEST 1 VIEW COMPARISON:  August 28, 2022 FINDINGS: The heart size and mediastinal contours are within normal limits. There is moderate severity calcification of the aortic arch. Low lung volumes are noted with mild, diffuse, chronic appearing increased interstitial lung markings. Mild atelectasis is seen within the bilateral lung bases. There is no evidence of a pleural effusion or pneumothorax. A biliary drainage catheter is seen overlying the lateral aspect of the right upper quadrant. Multilevel degenerative changes are seen throughout the thoracic spine. IMPRESSION: Low lung volumes with mild bibasilar atelectasis. Electronically Signed   By: Aram Candela M.D.   On: 09/16/2022 19:29   IR CHOLANGIOGRAM EXISTING TUBE  Result Date: 09/16/2022 INDICATION: History of acute cholecystitis, post ultrasound cholecystostomy tube  placement on 08/30/2022. EXAM: CHOLECYSTOSTOMY TUBE EVALUATION and ANTEGRADE CHOLANGIOGRAM COMPARISON:  IR fluoroscopy, 08/30/2022.  CTA chest, 08/29/2022. MEDICATIONS: None ANESTHESIA/SEDATION: None CONTRAST:  5mL OMNIPAQUE IOHEXOL 300 MG/ML SOLN - administered into the gallbladder fossa. FLUOROSCOPY TIME:  Fluoroscopic dose; 4 mGy COMPLICATIONS: None immediate. PROCEDURE: The patient was positioned supine on the fluoroscopy table. A preprocedural spot fluoroscopic image was obtained of the right upper abdominal quadrant existing cholecystostomy tube. Multiple spot fluoroscopic radiographic images were obtained of the RIGHT upper abdominal quadrant an existing cholecystostomy tube following injection of a small amount of contrast. Images were reviewed and discussed with the patient. The cholecystostomy tube was flushed with a small amount of saline. The previous stitch was removed, and a new 2-0 Ethilon stitch was placed. A dressing was placed. The patient tolerated the procedure well without immediate postprocedural complication. Procedure assisted by, Loran Senters IR RT FINDINGS: Preprocedural spot fluoroscopic image of the right upper abdominal quadrant demonstrates grossly unchanged positioning of the cholecystostomy tube with end coiled and locked overlying the expected location of the gallbladder fundus. Subsequent contrast injection demonstrates appropriate functionality of the cholecystostomy tube with brisk opacification of the gallbladder. There is passage of contrast from the gallbladder through the cystic and common bile ducts the level duodenum. No biliary ductal filling defect to suggest the presence of choledocholithiasis. IMPRESSION: 1. Appropriately positioned and functioning cholecystostomy tube. No exchange performed. 2. Patent cystic and common bile ducts. No fluoroscopic evidence of choledocholithiasis. PLAN: The patient will return to V[facility] Inter[facility]iology (VIR) for routine drainage  catheter evaluation and exchange in 6 weeks (from catheter placement). Roanna Banning, MD [facility] Electronically Signed   By: Roanna Banning M.D.   On: 09/16/2022 14:15    Pending Labs Unresulted Labs (From admission, onward)     Start     Ordered   09/17/22 0500  MRSA Next Gen by PCR, Nasal  (MRSA Screening)  Once,   URGENT        09/16/22 2139   09/16/22 1851  Blood culture (routine x 2)  BLOOD CULTURE X 2,   STAT      09/16/22 1851   09/16/22 1851  Lactic acid, plasma  Now then every  2 hours,   R (with STAT occurrences)      09/16/22 1851   09/16/22 1823  Urinalysis, Routine w reflex microscopic -Urine, Clean Catch  Once,   URGENT       Question:  Specimen Source  Answer:  Urine, Clean Catch   09/16/22 1822            Vitals/Pain Today's Vitals   09/16/22 1915 09/16/22 1930 09/16/22 2230 09/16/22 2242  BP: 123/74 126/73 111/66   Pulse: (!) 122 (!) 120 (!) 103   Resp: (!) 29 (!) 23 20   Temp:      TempSrc:      SpO2: 93% 92% 94%   PainSc: 7    9     Isolation Precautions No active isolations  Medications Medications  ceFEPIme (MAXIPIME) 2 g in sodium chloride 0.9 % 100 mL IVPB (2 g Intravenous New Bag/Given 09/16/22 2255)  vancomycin (VANCOCIN) IVPB 1000 mg/200 mL premix (1,000 mg Intravenous New Bag/Given 09/16/22 2245)    Followed by  vancomycin (VANCOCIN) 500 mg in sodium chloride 0.9 % 100 mL IVPB (has no administration in time range)  vancomycin (VANCOCIN) IVPB 1000 mg/200 mL premix (has no administration in time range)  ondansetron (ZOFRAN) injection 4 mg (4 mg Intravenous Given 09/16/22 1828)  fentaNYL (SUBLIMAZE) injection 50 mcg (50 mcg Intravenous Given 09/16/22 2252)  morphine (PF) 4 MG/ML injection 4 mg (4 mg Intravenous Given 09/16/22 1939)  sodium chloride 0.9 % bolus 500 mL (0 mLs Intravenous Stopped 09/16/22 2133)  ondansetron (ZOFRAN) injection 4 mg (4 mg Intravenous Given 09/16/22 1937)  iohexol  (OMNIPAQUE) 350 MG/ML injection 100 mL (100 mLs Intravenous Contrast Given 09/16/22 1954)    Mobility walks     Focused Assessments Good output from drain, A&Ox4  R Recommendations: See Admitting Provider Note  Report given to:   Additional Notes: Pain improving

## 2022-09-16 NOTE — ED Notes (Signed)
[facility] called @23 :14

## 2022-09-16 NOTE — Progress Notes (Signed)
Pharmacy Antibiotic Note  Tramain Gershman is a 72 y.o. male for which pharmacy has been consulted for cefepime and vancomycin dosing for pneumonia.  SCr 1.05 WBC 14.7; LA 2; T 98.2; HR 120; RR 23  Plan: Cefepime 2g q8hr  Vancomycin 1500 mg once then 1000 mg q12hr (eAUC 510.9) unless change in renal function Monitor WBC, fever, renal function, cultures De-escalate when able Levels at steady state F/u MRSA PCR     Temp (24hrs), Avg:98.2 F (36.8 C), Min:98.2 F (36.8 C), Max:98.2 F (36.8 C)  Recent Labs  Lab 09/13/22 1000 09/16/22 1825 09/16/22 1933  WBC 7.2 14.7*  --   CREATININE 0.87 1.05  --   LATICACIDVEN  --   --  2.0*    Estimated Creatinine Clearance: 67.7 mL/min (by C-G formula based on SCr of 1.05 mg/dL).    Allergies  Allergen Reactions   Naproxen Other (See Comments)    (Naprosyn *ANALGESICS - ANTI-INFLAMMATORY*) Nausea, Abdominal pain   Silodosin Other (See Comments)    Low blood pressure   Microbiology results: Pending  Thank you for allowing pharmacy to be a part of this patient's care.  Delmar Landau, PharmD, BCPS 09/16/2022 9:36 PM ED Clinical Pharmacist -  [phone]

## 2022-09-16 NOTE — Patient Instructions (Addendum)
IPF (idiopathic pulmonary fibrosis) (HCC) Medication monitoring encounter  -Pulmonary fibrosis itself appears to be stable since December 2022 based on pulmonary function test and symptoms..  Overall tolerating Esbriet well.  LFT normal September 13, 2022  plan - continue esbriet per schedule - -Recheck spirometry and DLCO in 3 months  Pulmonary embolism small June 2024 requirng o2 in hospital  - now off o2  Plan  -cotninue eliquis; duration likely 1+ years  Acute cholecystisis s/p drain  - today drain seems non-functional  Plan   - per IR  -Follow-up - 3 months Dr Marchelle Gearing but after PFT; symptom score and walk at followup  -30-minute visit

## 2022-09-16 NOTE — ED Notes (Signed)
Pt. Has a drainage bag for gallbladder sludge in the R side.  Pt. Is in no distress with pain off an on in the R side.  Pt. Has been in [facility] for this and was sent home for the gallbladder due to having Blood clots at this time.

## 2022-09-16 NOTE — Progress Notes (Signed)
Plan of Care Note for accepted transfer   Patient: Robert Lang MRN: [id number]   DOA: 09/16/2022  Facility requesting transfer: [facility]   Requesting Provider: Dr. Wallace Cullens   Reason for transfer: Acute hypoxic respiratory failure   Facility course: 72 yr old man with hx of IPF, COPD, OSA, CAD, PAF, and recent PE and cholecystitis with cholecystostomy tube who presents with abdominal pain and SOB. He had cholangiogram by IR today with appropriate positioning of tube and no apparent complications.   He is hypoxic and tachycardic in ED.   There is no acute findings on CT abd/pelvis. CTA chest demonstrates resolution of recent LLL PE but single small subsegmental PE in lingula as well as increased bibasilar opacities. WBC is 14.7 and lactate 2.0.    ED physician had pulmonology review CT and they recommended continuing Eliquis for the PE, and also felt the bibasilar opacities looked like aspiration.   Blood cultures were collected and he was treated with 500 mL NS, vancomycin, cefepime, morphine, fentanyl, and Zofran.   Plan of care: The patient is accepted for admission to Telemetry unit, at [facility].   Author: Briscoe Deutscher, MD 09/16/2022  Check [email or web address] for on-call coverage.  Nursing staff, Please call [facility] Admits & Consults System-Wide number on Amion as soon as patient's arrival, so appropriate admitting provider can evaluate the pt.

## 2022-09-16 NOTE — ED Provider Notes (Signed)
[place] [facility] AT [facility] Provider Note   CSN: [id number] Arrival date & time: 09/16/22  1725     History  Chief Complaint  Patient presents with   Abdominal Pain    Robert Lang is a 72 y.o. male.  Patient is a 72 year old male s/p cholecystostomy tube placement by IR at [facility] 08/31/22 for cholecystitis while awaiting treatment for pulmonary embolisms prior to cholecystectomy presenting for acute right upper quadrant and right lower quadrant pain.  Patient still has cholecystostomy tube at this time.  His wife states over the last 24 hours the cholecystostomy drain had no output so this morning he had an appointment for a choleangiogram  After reviewing their note, there was no demonstrated obstruction.  They changed the lower output collection bag and the patient began draining bilious contents.  The patient states shortly afterward he began having severe right upper and right lower quadrant abdominal pain, chills, shortness of breath.  Arrived to emergency department today tachycardic and hypoxic in the mid 80s while resting.  In addition to his newly diagnosed pulmonary embolisms he also has a history of pulmonary fibrosis.  Before his cholangiogram he went to his pulmonology appointment and was satting in the high 90s.  He was discharged from the hospital with diagnosis of pulmonary embolism he was not sent home on oxygen.  The history is provided by the patient. No language interpreter was used.  Abdominal Pain      Home Medications     Allergies    Naproxen and Silodosin    Review of Systems   Review of Systems  Gastrointestinal:  Positive for abdominal pain.    Physical Exam Updated Vital Signs BP 126/73   Pulse (!) 120   Temp 98.2 F (36.8 C) (Oral)   Resp (!) 23   SpO2 92%  Physical Exam  ED Results / Procedures / Treatments   Labs (all labs ordered are listed, but only abnormal results are displayed) Labs  Reviewed  LIPASE, BLOOD - Abnormal; Notable for the following components:      Result Value   Lipase 66 (*)    All other components within normal limits  COMPREHENSIVE METABOLIC PANEL - Abnormal; Notable for the following components:   Glucose, Bld 118 (*)    All other components within normal limits  CBC - Abnormal; Notable for the following components:   WBC 14.7 (*)    All other components within normal limits  LACTIC ACID, PLASMA - Abnormal; Notable for the following components:   Lactic Acid, Venous 2.0 (*)    All other components within normal limits  CULTURE, BLOOD (ROUTINE X 2)  CULTURE, BLOOD (ROUTINE X 2)  MRSA NEXT GEN BY PCR, NASAL  URINALYSIS, ROUTINE W REFLEX MICROSCOPIC  LACTIC ACID, PLASMA    EKG None  Radiology CT Angio Chest PE W and/or Wo Contrast  Result Date: 09/16/2022 CLINICAL DATA:  Pulmonary embolism suspected. High probability. New onset hypoxia and shortness of breath. Also severe abdominal pain. Recently discharged after treatment for small subsegmental arterial emboli in the left lower lobe. EXAM: CT ANGIOGRAPHY CHEST WITH CONTRAST TECHNIQUE: Multidetector CT imaging of the chest was performed using the standard protocol during bolus administration of intravenous contrast. Multiplanar CT image reconstructions and MIPs were obtained to evaluate the vascular anatomy. RADIATION DOSE REDUCTION: This exam was performed according to the departmental dose-optimization program which includes automated exposure control, adjustment of the mA and/or kV according to patient size  and/or use of iterative reconstruction technique. CONTRAST:  OMNIPAQUE IOHEXOL 350 MG/ML SOLN COMPARISON:  Portable chest today, CTA chest 08/29/2022, CTA chest 08/25/2021 FINDINGS: Cardiovascular: Previously the patient had scattered subsegmental left lower lobe arterial filling defects. These are no longer seen. There is a small-volume embolic filling defect within a single lingular  subsegmental artery on series 2 axial 43 and 44 and also visible on series 5 images 63-65. No further emboli are detected. There is panchamber cardiomegaly with a slight right chamber predominance which was seen previously but no IVC reflux to suspect acute right heart strain. There is no pericardial effusion. There are heavy calcifications in the left main and LAD coronary arteries, additional calcifications and stenting in the right coronary artery and small amount of calcification in the proximal circumflex artery. Lipomatous hypertrophy of the inter-atrial septum is chronically noted as well. The pulmonary veins are nondistended. The aorta and great vessels are tortuous but normal caliber. No dissection is seen. There is moderate patchy aortic calcific plaque, less heavy scattered plaque in the great vessels. Mediastinum/Nodes: No enlarged mediastinal, hilar, or axillary lymph nodes. Thyroid gland, trachea, and esophagus demonstrate no significant findings. Lungs/Pleura: Bilateral trace pleural effusions are similar to the last CT. No pneumothorax. There are opacities in both posterior basal lower lobes, noted to varying degrees on both prior studies, bilaterally increased today and could indicate increased atelectasis or developing pneumonia. There is a chronically elevated right hemidiaphragm and mild posterior atelectasis in the upper lobes. The lungs are otherwise clear. No nodules are seen. The main bronchi are patent. There is chronic bronchial thickening in the lower lobes. Upper Abdomen: A percutaneous cholecystostomy drain is noted. The liver is steatotic and mildly enlarged. No other significant findings in the visualized upper abdomen. Musculoskeletal: Osteopenia and degenerative change of the spine. No acute osseous findings. No aggressive regional bone lesion. No chest wall mass is seen. Review of the MIP images confirms the above findings. IMPRESSION: 1. Positive for a single small-volume  subsegmental embolus in the lingula. No other emboli are seen. 2. Subsegmental emboli in the left lower lobe noted previously have cleared in the interval. 3. Cardiomegaly without evidence of acute right heart strain or venous dilatation. Chronic slight right chamber predominance. 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 4. Aortic and coronary artery atherosclerosis and lipomatous hypertrophy of the inter-atrial septum. 5. Trace pleural effusions with increased opacities in the posterior basal lower lobes which could be atelectasis or developing pneumonia. 6. Chronic lower lobe bronchial thickening. 7. Percutaneous cholecystostomy drain. 8. Hepatic steatosis and mild hepatomegaly. 9. Osteopenia and degenerative change. Aortic Atherosclerosis (ICD10-I70.0). Electronically Signed   By: Almira Bar M.D.   On: 09/16/2022 20:53   CT ABDOMEN PELVIS W CONTRAST  Result Date: 09/16/2022 CLINICAL DATA:  Right lower quadrant abdominal pain since yesterday, history of cholecystostomy tube EXAM: CT ABDOMEN AND PELVIS WITH CONTRAST TECHNIQUE: Multidetector CT imaging of the abdomen and pelvis was performed using the standard protocol following bolus administration of intravenous contrast. RADIATION DOSE REDUCTION: This exam was performed according to the departmental dose-optimization program which includes automated exposure control, adjustment of the mA and/or kV according to patient size and/or use of iterative reconstruction technique. CONTRAST:  OMNIPAQUE IOHEXOL 350 MG/ML SOLN COMPARISON:  09/16/2022, 08/29/2022 FINDINGS: Lower chest: Bibasilar hypoventilatory changes. No acute pleural or parenchymal lung disease. Atherosclerosis of the aorta and coronary vasculature. Hepatobiliary: Stable hemangioma right lobe liver. Otherwise the liver is unremarkable. Percutaneous cholecystostomy tube is seen coiled within the  gallbladder lumen, with complete decompression of the gallbladder. There is mild pericholecystic fat  stranding which may be related history of cholecystitis or indwelling drain. No biliary duct dilation. Pancreas: Unremarkable. No pancreatic ductal dilatation or surrounding inflammatory changes. Spleen: Normal in size without focal abnormality. Adrenals/Urinary Tract: Calcifications of the bilateral renal hila are likely vascular. No urinary tract calculi or obstructive uropathy. Kidneys enhance normally. The adrenals and bladder are unremarkable. Stomach/Bowel: No bowel obstruction or ileus. Diverticulosis of the descending and sigmoid colon without diverticulitis. Prior appendectomy. No bowel wall thickening. Vascular/Lymphatic: Aortic atherosclerosis. No enlarged abdominal or pelvic lymph nodes. Reproductive: Prostate is unremarkable. Other: No free fluid or free intraperitoneal gas. No abdominal wall hernia. Musculoskeletal: No acute or destructive bony abnormalities. Reconstructed images demonstrate no additional findings. IMPRESSION: 1. Indwelling cholecystostomy tube without evidence of complication. 2. Diverticulosis without diverticulitis. 3.  Aortic Atherosclerosis (ICD10-I70.0). Electronically Signed   By: Sharlet Salina M.D.   On: 09/16/2022 20:36   DG Abdomen 1 View  Result Date: 09/16/2022 CLINICAL DATA:  Abdominal pain and chills. EXAM: ABDOMEN - 1 VIEW COMPARISON:  None Available. FINDINGS: The bowel gas pattern is normal. A percutaneous biliary drainage catheter is seen overlying the right upper quadrant. No radio-opaque calculi or other significant radiographic abnormality are seen. IMPRESSION: Percutaneous biliary drainage catheter overlying the right upper quadrant. Electronically Signed   By: Aram Candela M.D.   On: 09/16/2022 19:30   DG Chest Portable 1 View  Result Date: 09/16/2022 CLINICAL DATA:  Abdominal pain and chills EXAM: PORTABLE CHEST 1 VIEW COMPARISON:  August 28, 2022 FINDINGS: The heart size and mediastinal contours are within normal limits. There is moderate severity  calcification of the aortic arch. Low lung volumes are noted with mild, diffuse, chronic appearing increased interstitial lung markings. Mild atelectasis is seen within the bilateral lung bases. There is no evidence of a pleural effusion or pneumothorax. A biliary drainage catheter is seen overlying the lateral aspect of the right upper quadrant. Multilevel degenerative changes are seen throughout the thoracic spine. IMPRESSION: Low lung volumes with mild bibasilar atelectasis. Electronically Signed   By: Aram Candela M.D.   On: 09/16/2022 19:29   IR CHOLANGIOGRAM EXISTING TUBE  Result Date: 09/16/2022 INDICATION: History of acute cholecystitis, post ultrasound cholecystostomy tube placement on 08/30/2022. EXAM: CHOLECYSTOSTOMY TUBE EVALUATION and ANTEGRADE CHOLANGIOGRAM COMPARISON:  IR fluoroscopy, 08/30/2022.  CTA chest, 08/29/2022. MEDICATIONS: None ANESTHESIA/SEDATION: None CONTRAST:  5mL OMNIPAQUE IOHEXOL 300 MG/ML SOLN - administered into the gallbladder fossa. FLUOROSCOPY TIME:  Fluoroscopic dose; 4 mGy COMPLICATIONS: None immediate. PROCEDURE: The patient was positioned supine on the fluoroscopy table. A preprocedural spot fluoroscopic image was obtained of the right upper abdominal quadrant existing cholecystostomy tube. Multiple spot fluoroscopic radiographic images were obtained of the RIGHT upper abdominal quadrant an existing cholecystostomy tube following injection of a small amount of contrast. Images were reviewed and discussed with the patient. The cholecystostomy tube was flushed with a small amount of saline. The previous stitch was removed, and a new 2-0 Ethilon stitch was placed. A dressing was placed. The patient tolerated the procedure well without immediate postprocedural complication. Procedure assisted by, Loran Senters IR RT FINDINGS: Preprocedural spot fluoroscopic image of the right upper abdominal quadrant demonstrates grossly unchanged positioning of the cholecystostomy tube with  end coiled and locked overlying the expected location of the gallbladder fundus. Subsequent contrast injection demonstrates appropriate functionality of the cholecystostomy tube with brisk opacification of the gallbladder. There is passage of contrast from the gallbladder through  the cystic and common bile ducts the level duodenum. No biliary ductal filling defect to suggest the presence of choledocholithiasis. IMPRESSION: 1. Appropriately positioned and functioning cholecystostomy tube. No exchange performed. 2. Patent cystic and common bile ducts. No fluoroscopic evidence of choledocholithiasis. PLAN: The patient will return to [facility] ([facility]) for routine drainage catheter evaluation and exchange in 6 weeks (from catheter placement). Roanna Banning, MD [facility] Radiology Electronically Signed   By: Roanna Banning M.D.   On: 09/16/2022 14:15    Procedures .Critical Care  Performed by: Franne Forts, DO Authorized by: Franne Forts, DO   Critical care provider statement:    Critical care time (minutes):  76   Critical care was necessary to treat or prevent imminent or life-threatening deterioration of the following conditions:  Sepsis and respiratory failure   Critical care was time spent personally by me on the following activities:  Development of treatment plan with patient or surrogate, discussions with consultants, evaluation of patient's response to treatment, examination of patient, ordering and review of laboratory studies, ordering and review of radiographic studies, ordering and performing treatments and interventions, pulse oximetry, re-evaluation of patient's condition and review of old charts   Care discussed with: admitting provider     Care discussed with comment:  Consult to pulmnology     Medications Ordered in ED Medications  fentaNYL (SUBLIMAZE) injection 50 mcg (50 mcg Intravenous Given 09/16/22 1828)   ceFEPIme (MAXIPIME) 2 g in sodium chloride 0.9 % 100 mL IVPB (has no administration in time range)  vancomycin (VANCOCIN) IVPB 1000 mg/200 mL premix (has no administration in time range)    Followed by  vancomycin (VANCOCIN) 500 mg in sodium chloride 0.9 % 100 mL IVPB (has no administration in time range)  vancomycin (VANCOCIN) IVPB 1000 mg/200 mL premix (has no administration in time range)  ondansetron (ZOFRAN) injection 4 mg (4 mg Intravenous Given 09/16/22 1828)  morphine (PF) 4 MG/ML injection 4 mg (4 mg Intravenous Given 09/16/22 1939)  sodium chloride 0.9 % bolus 500 mL (0 mLs Intravenous Stopped 09/16/22 2133)  ondansetron (ZOFRAN) injection 4 mg (4 mg Intravenous Given 09/16/22 1937)  iohexol (OMNIPAQUE) 350 MG/ML injection 100 mL (100 mLs Intravenous Contrast Given 09/16/22 1954)    ED Course/ Medical Decision Making/ A&P                             Medical Decision Making Amount and/or Complexity of Data Reviewed Labs: ordered. Radiology: ordered.  Risk Prescription drug management. Decision regarding hospitalization.   72 year old male s/p cholecystostomy tube placement by IR at [facility] 08/31/22 for cholecystitis while awaiting treatment for pulmonary embolisms prior to cholecystectomy presenting for acute right upper quadrant and right lower quadrant pain.    Patient is alert and oriented x 3, acute distress due to pain, afebrile, with tachycardia and tachypnea.  Heart rate 120, respiratory rate 23, hypoxic at 85% while resting in bed.  Nasal cannula placed at 2 L.  Concern for acute life-threatening etiologies for abdominal pain and hypoxia.  Patient recently diagnosed with left lower lobe pulmonary embolism.  Currently on Eliquis.  Repeat CT PE ordered to rule out new emboli.  Patient does have a positive CT PE scan for emboli in the lingula.  Patient also has questionable pneumonia versus atelectasis.  I spoke with on-call pulmonology physician who has  reviewed the scans.  States patient likely  has not failed Eliquis at this time due to the left lower lobe emboli improving.  Recommends continuing current therapy.  Also remarking lung findings likely severe atelectasis versus aspiration pneumonia.  Patient admitting to episode of aspiration earlier this week.  In the setting of tachycardia, tachypnea, hypoxia, and leukocytosis (white blood cell count around 7 at discharge and now 14) will treat for sepsis with vancomycin and cefepime.  I spoke with admitting physician Dr. Antionette Char agrees to accept patient for hypoxia likely secondary to aspiration pneumonia and PE.  Oxygen currently stable at 92% on 2 L.  For patient's abdominal pain, he has stable liver profile, lipase, and renal function.  He had a cholangiogram earlier today.  His cholecystostomy tube is draining bile without obstruction.  Repeat CT abdomen and pelvis demonstrated no acute process in the abdomen.  I do not have a good reason for the patient's abdominal pain at this time.  Currently pain is controlled and patient is resting comfortably in bed asking for food.   Final Clinical Impression(s) / ED Diagnoses Final diagnoses:  Tachycardia  Hypoxia  Generalized abdominal pain  Sepsis, due to unspecified organism, unspecified whether acute organ dysfunction present [facility])  Other acute pulmonary embolism without acute cor pulmonale (HCC)  Other acute pulmonary embolism, unspecified whether acute cor pulmonale present (HCC)  Aspiration pneumonia of both lower lobes, unspecified aspiration pneumonia type (HCC)  Acute respiratory failure with hypoxia [facility])    Rx / DC Orders ED Discharge Orders     None         Franne Forts, DO 09/16/22 2154

## 2022-09-16 NOTE — ED Triage Notes (Signed)
Pt having severe abdominal pain since last night.  [facility] admitted to [facility] for [facility]bladder surgery but was unable to have surgery due to PE.  Pt had drain placed and was to have surgery once PE has resolved.  Drain was not draining well for a few days.  Went today to have drained replaced at [facility].  Pt is now having worsening of abdominal pain and chills.

## 2022-09-17 ENCOUNTER — Encounter (HOSPITAL_COMMUNITY): Payer: Self-pay | Admitting: Family Medicine

## 2022-09-17 ENCOUNTER — Inpatient Hospital Stay (HOSPITAL_COMMUNITY): Payer: Medicare Other

## 2022-09-17 DIAGNOSIS — E782 Mixed hyperlipidemia: Secondary | ICD-10-CM

## 2022-09-17 DIAGNOSIS — J84112 Idiopathic pulmonary fibrosis: Secondary | ICD-10-CM

## 2022-09-17 DIAGNOSIS — A419 Sepsis, unspecified organism: Secondary | ICD-10-CM | POA: Diagnosis present

## 2022-09-17 DIAGNOSIS — E7800 Pure hypercholesterolemia, unspecified: Secondary | ICD-10-CM | POA: Diagnosis present

## 2022-09-17 DIAGNOSIS — Z8719 Personal history of other diseases of the digestive system: Secondary | ICD-10-CM

## 2022-09-17 DIAGNOSIS — R609 Edema, unspecified: Secondary | ICD-10-CM | POA: Diagnosis not present

## 2022-09-17 DIAGNOSIS — J309 Allergic rhinitis, unspecified: Secondary | ICD-10-CM | POA: Diagnosis present

## 2022-09-17 DIAGNOSIS — F32A Depression, unspecified: Secondary | ICD-10-CM

## 2022-09-17 DIAGNOSIS — Z86711 Personal history of pulmonary embolism: Secondary | ICD-10-CM | POA: Diagnosis not present

## 2022-09-17 DIAGNOSIS — I480 Paroxysmal atrial fibrillation: Secondary | ICD-10-CM | POA: Diagnosis present

## 2022-09-17 DIAGNOSIS — E871 Hypo-osmolality and hyponatremia: Secondary | ICD-10-CM | POA: Diagnosis present

## 2022-09-17 DIAGNOSIS — Z933 Colostomy status: Secondary | ICD-10-CM | POA: Diagnosis not present

## 2022-09-17 DIAGNOSIS — N400 Enlarged prostate without lower urinary tract symptoms: Secondary | ICD-10-CM | POA: Diagnosis present

## 2022-09-17 DIAGNOSIS — Z8709 Personal history of other diseases of the respiratory system: Secondary | ICD-10-CM

## 2022-09-17 DIAGNOSIS — F411 Generalized anxiety disorder: Secondary | ICD-10-CM | POA: Diagnosis present

## 2022-09-17 DIAGNOSIS — J9811 Atelectasis: Secondary | ICD-10-CM | POA: Diagnosis present

## 2022-09-17 DIAGNOSIS — J690 Pneumonitis due to inhalation of food and vomit: Secondary | ICD-10-CM

## 2022-09-17 DIAGNOSIS — Z955 Presence of coronary angioplasty implant and graft: Secondary | ICD-10-CM | POA: Diagnosis not present

## 2022-09-17 DIAGNOSIS — E872 Acidosis, unspecified: Secondary | ICD-10-CM | POA: Diagnosis present

## 2022-09-17 DIAGNOSIS — J449 Chronic obstructive pulmonary disease, unspecified: Secondary | ICD-10-CM | POA: Diagnosis present

## 2022-09-17 DIAGNOSIS — R6520 Severe sepsis without septic shock: Secondary | ICD-10-CM | POA: Diagnosis present

## 2022-09-17 DIAGNOSIS — I2693 Single subsegmental pulmonary embolism without acute cor pulmonale: Secondary | ICD-10-CM | POA: Diagnosis present

## 2022-09-17 DIAGNOSIS — J9601 Acute respiratory failure with hypoxia: Secondary | ICD-10-CM | POA: Diagnosis present

## 2022-09-17 DIAGNOSIS — I252 Old myocardial infarction: Secondary | ICD-10-CM | POA: Diagnosis not present

## 2022-09-17 DIAGNOSIS — J301 Allergic rhinitis due to pollen: Secondary | ICD-10-CM | POA: Diagnosis not present

## 2022-09-17 DIAGNOSIS — I2510 Atherosclerotic heart disease of native coronary artery without angina pectoris: Secondary | ICD-10-CM | POA: Diagnosis present

## 2022-09-17 DIAGNOSIS — I119 Hypertensive heart disease without heart failure: Secondary | ICD-10-CM | POA: Diagnosis present

## 2022-09-17 DIAGNOSIS — R0602 Shortness of breath: Secondary | ICD-10-CM | POA: Diagnosis present

## 2022-09-17 DIAGNOSIS — K2970 Gastritis, unspecified, without bleeding: Secondary | ICD-10-CM | POA: Diagnosis present

## 2022-09-17 DIAGNOSIS — Z87891 Personal history of nicotine dependence: Secondary | ICD-10-CM | POA: Diagnosis not present

## 2022-09-17 LAB — BLOOD GAS, VENOUS
Acid-Base Excess: 0.5 mmol/L (ref 0.0–2.0)
Bicarbonate: 25.4 mmol/L (ref 20.0–28.0)
Drawn by: 7874
O2 Saturation: 77.8 %
Patient temperature: 37.2
pCO2, Ven: 41 mmHg — ABNORMAL LOW (ref 44–60)
pH, Ven: 7.4 (ref 7.25–7.43)
pO2, Ven: 44 mmHg (ref 32–45)

## 2022-09-17 LAB — CBC WITH DIFFERENTIAL/PLATELET
Abs Immature Granulocytes: 0.06 10*3/uL (ref 0.00–0.07)
Basophils Absolute: 0 10*3/uL (ref 0.0–0.1)
Basophils Relative: 0 %
Eosinophils Absolute: 0 10*3/uL (ref 0.0–0.5)
Eosinophils Relative: 0 %
HCT: 42.7 % (ref 39.0–52.0)
Hemoglobin: 13.5 g/dL (ref 13.0–17.0)
Immature Granulocytes: 1 %
Lymphocytes Relative: 18 %
Lymphs Abs: 2 10*3/uL (ref 0.7–4.0)
MCH: 30.9 pg (ref 26.0–34.0)
MCHC: 31.6 g/dL (ref 30.0–36.0)
MCV: 97.7 fL (ref 80.0–100.0)
Monocytes Absolute: 1.1 10*3/uL — ABNORMAL HIGH (ref 0.1–1.0)
Monocytes Relative: 9 %
Neutro Abs: 8.2 10*3/uL — ABNORMAL HIGH (ref 1.7–7.7)
Neutrophils Relative %: 72 %
Platelets: 165 10*3/uL (ref 150–400)
RBC: 4.37 MIL/uL (ref 4.22–5.81)
RDW: 14.1 % (ref 11.5–15.5)
WBC: 11.5 10*3/uL — ABNORMAL HIGH (ref 4.0–10.5)
nRBC: 0 % (ref 0.0–0.2)

## 2022-09-17 LAB — PHOSPHORUS: Phosphorus: 3.6 mg/dL (ref 2.5–4.6)

## 2022-09-17 LAB — RESPIRATORY PANEL BY PCR

## 2022-09-17 LAB — COMPREHENSIVE METABOLIC PANEL
ALT: 22 U/L (ref 0–44)
AST: 18 U/L (ref 15–41)
Albumin: 2.9 g/dL — ABNORMAL LOW (ref 3.5–5.0)
Alkaline Phosphatase: 48 U/L (ref 38–126)
Anion gap: 8 (ref 5–15)
BUN: 12 mg/dL (ref 8–23)
CO2: 23 mmol/L (ref 22–32)
Calcium: 8.1 mg/dL — ABNORMAL LOW (ref 8.9–10.3)
Chloride: 101 mmol/L (ref 98–111)
Creatinine, Ser: 0.94 mg/dL (ref 0.61–1.24)
GFR, Estimated: >60 mL/min (ref >60)
Glucose, Bld: 128 mg/dL — ABNORMAL HIGH (ref 70–99)
Potassium: 3.9 mmol/L (ref 3.5–5.1)
Sodium: 132 mmol/L — ABNORMAL LOW (ref 135–145)
Total Bilirubin: 0.7 mg/dL (ref 0.3–1.2)
Total Protein: 6.4 g/dL — ABNORMAL LOW (ref 6.5–8.1)

## 2022-09-17 LAB — LIPASE, BLOOD: Lipase: 49 U/L (ref 11–51)

## 2022-09-17 LAB — EXPECTORATED SPUTUM ASSESSMENT W GRAM STAIN, RFLX TO RESP C

## 2022-09-17 LAB — STREP PNEUMONIAE URINARY ANTIGEN: Strep Pneumo Urinary Antigen: NEGATIVE

## 2022-09-17 LAB — MRSA NEXT GEN BY PCR, NASAL: MRSA by PCR Next Gen: NOT DETECTED

## 2022-09-17 LAB — MAGNESIUM: Magnesium: 2 mg/dL (ref 1.7–2.4)

## 2022-09-17 LAB — LACTIC ACID, PLASMA: Lactic Acid, Venous: 2.3 mmol/L (ref 0.5–1.9)

## 2022-09-17 LAB — CULTURE, BLOOD (ROUTINE X 2): Culture: NO GROWTH

## 2022-09-17 LAB — PROCALCITONIN: Procalcitonin: 0.42 ng/mL

## 2022-09-17 MED ORDER — PANTOPRAZOLE SODIUM 40 MG PO TBEC
40.0000 mg | DELAYED_RELEASE_TABLET | Freq: Every day | ORAL | Status: DC
  Administered 2022-09-17 – 2022-09-18 (×2): 40 mg via ORAL
  Filled 2022-09-17 (×2): qty 1

## 2022-09-17 MED ORDER — RANOLAZINE ER 500 MG PO TB12
1000.0000 mg | ORAL_TABLET | Freq: Two times a day (BID) | ORAL | Status: DC
  Administered 2022-09-17 – 2022-09-18 (×3): 1000 mg via ORAL
  Filled 2022-09-17 (×4): qty 2

## 2022-09-17 MED ORDER — MELATONIN 3 MG PO TABS
3.0000 mg | ORAL_TABLET | Freq: Every evening | ORAL | Status: DC | PRN
  Administered 2022-09-17 (×2): 3 mg via ORAL
  Filled 2022-09-17 (×2): qty 1

## 2022-09-17 MED ORDER — BUSPIRONE HCL 5 MG PO TABS
15.0000 mg | ORAL_TABLET | Freq: Three times a day (TID) | ORAL | Status: DC
  Administered 2022-09-17 – 2022-09-18 (×5): 15 mg via ORAL
  Filled 2022-09-17 (×5): qty 3

## 2022-09-17 MED ORDER — ACETAMINOPHEN 325 MG PO TABS: 650.0000 mg | ORAL_TABLET | Freq: Four times a day (QID) | ORAL | Status: DC | PRN

## 2022-09-17 MED ORDER — UMECLIDINIUM-VILANTEROL 62.5-25 MCG/ACT IN AEPB
1.0000 | INHALATION_SPRAY | Freq: Every day | RESPIRATORY_TRACT | Status: DC
  Administered 2022-09-17 – 2022-09-18 (×2): 1 via RESPIRATORY_TRACT
  Filled 2022-09-17: qty 14

## 2022-09-17 MED ORDER — LEVALBUTEROL HCL 1.25 MG/0.5ML IN NEBU
1.2500 mg | INHALATION_SOLUTION | Freq: Three times a day (TID) | RESPIRATORY_TRACT | Status: DC
  Administered 2022-09-18 (×2): 1.25 mg via RESPIRATORY_TRACT
  Filled 2022-09-17 (×4): qty 0.5

## 2022-09-17 MED ORDER — ONDANSETRON HCL 4 MG/2ML IJ SOLN: 4.0000 mg | Freq: Four times a day (QID) | INTRAMUSCULAR | Status: DC | PRN

## 2022-09-17 MED ORDER — ACETAMINOPHEN 650 MG RE SUPP: 650.0000 mg | Freq: Four times a day (QID) | RECTAL | Status: DC | PRN

## 2022-09-17 MED ORDER — FAMOTIDINE 20 MG PO TABS
40.0000 mg | ORAL_TABLET | Freq: Every day | ORAL | Status: DC
  Administered 2022-09-17: 40 mg via ORAL
  Filled 2022-09-17: qty 2

## 2022-09-17 MED ORDER — IPRATROPIUM BROMIDE 0.02 % IN SOLN
0.5000 mg | Freq: Four times a day (QID) | RESPIRATORY_TRACT | Status: DC
  Administered 2022-09-17: 0.5 mg via RESPIRATORY_TRACT
  Filled 2022-09-17: qty 2.5

## 2022-09-17 MED ORDER — LEVALBUTEROL HCL 1.25 MG/0.5ML IN NEBU: 1.2500 mg | INHALATION_SOLUTION | RESPIRATORY_TRACT | Status: DC | PRN

## 2022-09-17 MED ORDER — LEVALBUTEROL HCL 1.25 MG/0.5ML IN NEBU: 1.2500 mg | INHALATION_SOLUTION | Freq: Two times a day (BID) | RESPIRATORY_TRACT | Status: DC

## 2022-09-17 MED ORDER — SERTRALINE HCL 50 MG PO TABS
150.0000 mg | ORAL_TABLET | Freq: Every day | ORAL | Status: DC
  Administered 2022-09-17: 150 mg via ORAL
  Filled 2022-09-17: qty 1

## 2022-09-17 MED ORDER — IPRATROPIUM BROMIDE 0.02 % IN SOLN: 0.5000 mg | Freq: Two times a day (BID) | RESPIRATORY_TRACT | Status: DC

## 2022-09-17 MED ORDER — LEVALBUTEROL HCL 1.25 MG/0.5ML IN NEBU
1.2500 mg | INHALATION_SOLUTION | Freq: Four times a day (QID) | RESPIRATORY_TRACT | Status: DC
  Administered 2022-09-17: 1.25 mg via RESPIRATORY_TRACT
  Filled 2022-09-17 (×4): qty 0.5

## 2022-09-17 MED ORDER — LAMOTRIGINE 100 MG PO TABS
100.0000 mg | ORAL_TABLET | Freq: Every day | ORAL | Status: DC
  Administered 2022-09-17 – 2022-09-18 (×2): 100 mg via ORAL
  Filled 2022-09-17 (×2): qty 1

## 2022-09-17 MED ORDER — BUDESONIDE 0.25 MG/2ML IN SUSP
0.2500 mg | Freq: Two times a day (BID) | RESPIRATORY_TRACT | Status: DC
  Administered 2022-09-17 – 2022-09-18 (×3): 0.25 mg via RESPIRATORY_TRACT
  Filled 2022-09-17 (×3): qty 2

## 2022-09-17 MED ORDER — HYDROMORPHONE HCL 1 MG/ML IJ SOLN
0.5000 mg | INTRAMUSCULAR | Status: DC | PRN
  Administered 2022-09-17: 0.5 mg via INTRAVENOUS
  Filled 2022-09-17 (×3): qty 0.5

## 2022-09-17 MED ORDER — IPRATROPIUM BROMIDE 0.02 % IN SOLN: 0.5000 mg | Freq: Four times a day (QID) | RESPIRATORY_TRACT | Status: DC

## 2022-09-17 MED ORDER — METRONIDAZOLE 500 MG/100ML IV SOLN
500.0000 mg | Freq: Two times a day (BID) | INTRAVENOUS | Status: DC
  Administered 2022-09-17 – 2022-09-18 (×3): 500 mg via INTRAVENOUS
  Filled 2022-09-17 (×3): qty 100

## 2022-09-17 MED ORDER — GUAIFENESIN ER 600 MG PO TB12
1200.0000 mg | ORAL_TABLET | Freq: Two times a day (BID) | ORAL | Status: DC
  Administered 2022-09-17 – 2022-09-18 (×3): 1200 mg via ORAL
  Filled 2022-09-17 (×3): qty 2

## 2022-09-17 MED ORDER — FENTANYL CITRATE PF 50 MCG/ML IJ SOSY
25.0000 ug | PREFILLED_SYRINGE | INTRAMUSCULAR | Status: DC | PRN
  Administered 2022-09-17 (×2): 25 ug via INTRAVENOUS
  Filled 2022-09-17 (×2): qty 1

## 2022-09-17 MED ORDER — ACETAMINOPHEN 500 MG PO TABS
500.0000 mg | ORAL_TABLET | Freq: Three times a day (TID) | ORAL | Status: DC
  Administered 2022-09-17 – 2022-09-18 (×4): 500 mg via ORAL
  Filled 2022-09-17 (×4): qty 1

## 2022-09-17 MED ORDER — ARFORMOTEROL TARTRATE 15 MCG/2ML IN NEBU
15.0000 ug | INHALATION_SOLUTION | Freq: Two times a day (BID) | RESPIRATORY_TRACT | Status: DC
  Administered 2022-09-17: 15 ug via RESPIRATORY_TRACT
  Filled 2022-09-17: qty 2

## 2022-09-17 MED ORDER — FINASTERIDE 5 MG PO TABS
5.0000 mg | ORAL_TABLET | Freq: Every day | ORAL | Status: DC
  Administered 2022-09-17 – 2022-09-18 (×2): 5 mg via ORAL
  Filled 2022-09-17 (×2): qty 1

## 2022-09-17 MED ORDER — BUPROPION HCL ER (XL) 150 MG PO TB24
300.0000 mg | ORAL_TABLET | Freq: Every day | ORAL | Status: DC
  Administered 2022-09-17 – 2022-09-18 (×2): 300 mg via ORAL
  Filled 2022-09-17 (×2): qty 2

## 2022-09-17 MED ORDER — LORATADINE 10 MG PO TABS
10.0000 mg | ORAL_TABLET | Freq: Every day | ORAL | Status: DC
  Administered 2022-09-17 – 2022-09-18 (×2): 10 mg via ORAL
  Filled 2022-09-17 (×2): qty 1

## 2022-09-17 MED ORDER — APIXABAN 5 MG PO TABS
5.0000 mg | ORAL_TABLET | Freq: Two times a day (BID) | ORAL | Status: DC
  Administered 2022-09-17 – 2022-09-18 (×3): 5 mg via ORAL
  Filled 2022-09-17 (×3): qty 1

## 2022-09-17 MED ORDER — SODIUM CHLORIDE 0.9 % IV SOLN: INTRAVENOUS | Status: DC

## 2022-09-17 MED ORDER — LEVALBUTEROL HCL 1.25 MG/0.5ML IN NEBU
1.2500 mg | INHALATION_SOLUTION | Freq: Four times a day (QID) | RESPIRATORY_TRACT | Status: DC
  Administered 2022-09-17: 1.25 mg via RESPIRATORY_TRACT
  Filled 2022-09-17: qty 0.5

## 2022-09-17 MED ORDER — PIRFENIDONE 801 MG PO TABS
801.0000 mg | ORAL_TABLET | Freq: Three times a day (TID) | ORAL | Status: DC
  Administered 2022-09-17 – 2022-09-18 (×5): 801 mg via ORAL

## 2022-09-17 MED ORDER — LEVALBUTEROL HCL 1.25 MG/0.5ML IN NEBU
1.2500 mg | INHALATION_SOLUTION | Freq: Four times a day (QID) | RESPIRATORY_TRACT | Status: DC
  Filled 2022-09-17: qty 0.5

## 2022-09-17 MED ORDER — ATORVASTATIN CALCIUM 20 MG PO TABS
80.0000 mg | ORAL_TABLET | Freq: Every day | ORAL | Status: DC
  Administered 2022-09-17 – 2022-09-18 (×2): 80 mg via ORAL
  Filled 2022-09-17 (×2): qty 4

## 2022-09-17 MED ORDER — NALOXONE HCL 0.4 MG/ML IJ SOLN: 0.4000 mg | INTRAMUSCULAR | Status: DC | PRN

## 2022-09-17 MED ORDER — LEVALBUTEROL HCL 1.25 MG/0.5ML IN NEBU: 1.2500 mg | INHALATION_SOLUTION | Freq: Four times a day (QID) | RESPIRATORY_TRACT | Status: DC

## 2022-09-17 MED ORDER — PREGABALIN 75 MG PO CAPS
150.0000 mg | ORAL_CAPSULE | Freq: Two times a day (BID) | ORAL | Status: DC
  Administered 2022-09-17 – 2022-09-18 (×3): 150 mg via ORAL
  Filled 2022-09-17 (×3): qty 2

## 2022-09-17 NOTE — Consult Note (Signed)
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 Recent admit to [facility] 6/9-6/12 for acute cholecystitis. Was found to have a small sub-segmental PE for which he was started on heparin, ultimately discharged on eliquis. Chole drain placed by IR during 6/9-12 hospitalization in this setting with plan for cholecystectomy in 6-8wks.   Preceding 6/27 ED presentation, pt had worsening abd pain beginning 6/26 and chole drain had 0 output x 24 hrs or so. Was seen in pulm clinic 6/27 and lack of drain output was noted - -at that time pt was not hypoxic. He went for cholangiogram sometime after this pulm appointment, there was no obstruction. Shortly after, he had worse RUQ abd pain chills and SOB which prompted ED presentation.  Notably, pet pt/wife report, it was while he was in the ED that his chole drain started draining again and his pain & SOB was somewhat alleviated with this.  Had repeat CTA chest which raised question of a new embolus in the lingula, new BLL ASD. This was d/w on call pulm who rec cont eliquis and felt ASD might be aspiration pna. CT a/p was assuring against complication related to cholecystostomy tube.   Admitted to [facility] 6/28. PCCM is consulted 6/28 regarding ongoing SOB  Pertinent  Medical History  IPF PE Cholecystitis  pAF  Significant Hospital Events: Including procedures, antibiotic start and stop dates in addition to other pertinent events   6/27 ED with abd pain SOB  6/28 [facility] admit, PCCM consult   Interim History / Subjective:  SpO2 mid 90s on 2L   Feels like his SOB is getting better, still some dyspnea with talking at times   Chills rigors and fever is  improved, abd pain is improving   Objective   Blood pressure 113/60, pulse 98, temperature 98.1 F (36.7 C), resp. rate (!) 22, SpO2 95 %.        Intake/Output Summary (Last 24 hours) at 09/17/2022 1044 Last data filed at 09/17/2022 0914 Gross per 24 hour  Intake 888.59 ml  Output 825 ml  Net 63.59 ml   There were no vitals filed for this visit.  Examination: General: Chronically and acutely ill elderly M NAD HENT: NCAT Bagnell in place. Anicteric sclera Lungs: Basilar crackles, symmetrical chest expansion Cardiovascular: cap refill < 3 sec Abdomen: Drain site is painful to light touch. No erythema, discharge from insertion site. Draining bilious fluid. Abd otherwise is soft ndnt Extremities: no acute joint deformity. No lower extremity edema or erythema  Neuro: AAOx4 GU: defer  Resolved Hospital Problem list     Assessment & Plan:   Acute respiratory failure with hypoxia  HCAP vs aspiration PNA vs atelectasis  IPF Possible pulmonary embolism  -recent hx with dx of subsegmental PE while on xarelto, dc on eliquis. On current presentation CTA initially read as new lingula embolism, however on subsequent radiology review it is felt that this lingula embolism is streak artifact and there is actually question of prior embolism on prior CTA. Not sure that these are really PE. No DVT on either 6/12 or 6/28  -isnt having overt s/sx aspiration  -from how he is describing his SOB pattern/timing,  it sounds like this coincided with worse pain when his drain wasn't draining. Still has some SOB, some of which sounds chronic but hard to know to what extent.  -do not think this is IPF flare  -PCT was 0.41 -- in context of recent acute chole not sure that this is wholly suggestive of a superimposed infection  P -will order sputum cx and rvp-- but not sure he really has PNA. Difficult to distinctly r/o w underlying lung dx + some atelectasis, think a 5d course of doxy is adequate  -IS, mobility    -ambulatory sats -- with what he describe w HH, he might qualify for home O2  -will restart home anoro ellipta  -esbriet (pt supplied)   Cholecystitis s/p IR cholecystostomy tube -abd imaging is assuring against complication with tube, or other intraabdominal process to explain his pain. On exam it sounds like pain has improved after tube started draining, but there is definitely some pain proximal to insertion site which I do not think is unexpected  P -CCS is consulted,  no emergent operative intervention -IR consult is pending re drain eval but looks to be working well at the moment  -will relay our thoughts re +/- PE as this could potentially impact op planning. As is, has a follow up scheduled w [facility] 7/24   Afib -will need chronic AC regardless of PE above    Best Practice (right click and "Reselect all SmartList Selections" daily)   Per primary   Labs   CBC: Recent Labs  Lab 09/13/22 1000 09/16/22 1825 09/17/22 0122  WBC 7.2 14.7* 11.5*  NEUTROABS 4.6  --  8.2*  HGB 14.3 15.8 13.5  HCT 43.8 48.1 42.7  MCV 94.6 94.1 97.7  PLT 219.0 200 165    Basic Metabolic Panel: Recent Labs  Lab 09/13/22 1000 09/16/22 1825 09/17/22 0122  NA 136 135 132*  K 4.5 4.4 3.9  CL 104 99 101  CO2 24 24 23   GLUCOSE 119* 118* 128*  BUN 11 12 12   CREATININE 0.87 1.05 0.94  CALCIUM 8.8 9.0 8.1*  MG  --   --  2.0  PHOS  --   --  3.6   GFR: Estimated Creatinine Clearance: 75.7 mL/min (by C-G formula based on SCr of 0.94 mg/dL). Recent Labs  Lab 09/13/22 1000 09/16/22 1825 09/16/22 1933 09/17/22 0122  PROCALCITON  --   --   --  0.42  WBC 7.2 14.7*  --  11.5*  LATICACIDVEN  --   --  2.0* 2.3*    Liver Function Tests: Recent Labs  Lab 09/13/22 1000 09/16/22 1825 09/17/22 0122  AST 16 25 18   ALT 23 29 22   ALKPHOS 59 67 48  BILITOT 0.5 0.8 0.7  PROT 6.3 7.6 6.4*  ALBUMIN 3.5 3.7 2.9*   Recent Labs  Lab 09/13/22 1000 09/16/22 1825  LIPASE 133.0* 66*   No results  for input(s): "AMMONIA" in the last 168 hours.  ABG    Component Value Date/Time   PHART 7.407 05/09/2020 0905   PCO2ART 34.5 05/09/2020 0905   PO2ART 81.0 (L) 05/09/2020 0905   HCO3 25.4 09/17/2022 0502   TCO2 24 08/28/2022 1727   ACIDBASEDEF 1.0 08/28/2022 1727   O2SAT 77.8 09/17/2022 0502     Coagulation Profile: No results for input(s): "INR", "PROTIME" in the last 168 hours.  Cardiac Enzymes: No results for input(s): "CKTOTAL", "CKMB", "CKMBINDEX", "TROPONINI" in the last 168 hours.  HbA1C: Hgb A1c MFr Bld  Date/Time Value Ref Range Status  12/30/2021 09:44 AM 5.9 4.6 - 6.5 % Final    Comment:    Glycemic Control Guidelines for People with Diabetes:Non Diabetic:  <6%Goal of Therapy: <7%Additional Action Suggested:  >8%   01/25/2020 11:34 AM 5.1 <5.7 % of total Hgb Final    Comment:    For the purpose of screening for the presence of diabetes: . <5.7%       Consistent with the absence of diabetes 5.7-6.4%    Consistent with increased risk for diabetes             (prediabetes) > or =6.5%  Consistent with diabetes . This assay result is consistent with a decreased risk of diabetes. . Currently, no consensus exists regarding use of hemoglobin A1c for diagnosis of diabetes in children. . According to American Diabetes Association (ADA) guidelines, hemoglobin A1c <7.0% represents optimal control in non-pregnant diabetic patients. Different metrics may apply to specific patient populations.  Standards of Medical Care in Diabetes(ADA). .     CBG: No results for input(s): "GLUCAP" in the last 168 hours.  Review of Systems:   Review of Systems  Constitutional:  Positive for chills, diaphoresis and fever.  HENT: Negative.    Eyes: Negative.   Respiratory:  Positive for shortness of breath.   Cardiovascular: Negative.   Gastrointestinal:  Positive for abdominal pain.  Genitourinary: Negative.   Musculoskeletal: Negative.   Skin: Negative.   Neurological:  Negative.   Psychiatric/Behavioral: Negative.       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), Allergic rhinitis due to pollen (09/03/2013), Allergy, Anticoagulated (07/01/2016), Anxiety, Arthritis, Atrial fibrillation (HCC), Atrial flutter (HCC) (06/26/2015), CAD (coronary artery disease), Chest pain (06/26/2015), COPD (chronic obstructive pulmonary disease) (HCC), Coronary artery disease (07/06/2018), DDD (degenerative disc disease), cervical (09/03/2013), Depression, Depression, Depressive disorder (09/03/2013), Dizziness (10/28/2017), Dyslipidemia, goal LDL below 70 (07/06/2018), Dyspnea on exertion (10/20/2018), Esophageal reflux (09/03/2013), Essential hypertension (07/06/2018), ETOH abuse (01/11/2019), Falls (10/28/2017), GERD (gastroesophageal reflux disease), H/O amiodarone therapy (07/01/2016), Heart attack (HCC), Heart palpitations (01/27/2017), History of colon polyps, Hyperlipidemia, Hypertension, IPF (idiopathic pulmonary fibrosis) (HCC), Kidney stones, Neck pain (09/03/2013), Neuropathy (07/06/2018), Obstructive sleep apnea (08/05/2015), PLMD (periodic limb movement disorder) (11/04/2015), Polycythemia, 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   Surgical History:   Past Surgical History:  Procedure Laterality Date   ATRIAL FIBRILLATION ABLATION  10/2015   BACK SURGERY     CARDIOVERSION  2017   CATARACT EXTRACTION Bilateral 2017   March and April 2017   COLONOSCOPY  2018   CORONARY STENT PLACEMENT  2012   CORONARY/GRAFT ACUTE MI REVASCULARIZATION N/A 01/10/2019   Procedure: CORONARY/GRAFT ACUTE MI REVASCULARIZATION;  Surgeon: Marykay Lex, MD;  Location[facility] INVASIVE  CV LAB;  Service: Cardiovascular;  Laterality: N/A;   INGUINAL HERNIA REPAIR     over 20 years ago   IR CHOLANGIOGRAM EXISTING TUBE  09/16/2022   IR PERC CHOLECYSTOSTOMY  08/30/2022   LAPAROSCOPIC APPENDECTOMY N/A 01/17/2021   Procedure: APPENDECTOMY LAPAROSCOPIC;  Surgeon: Quentin Ore, MD;  Location: [facility] OR;  Service: General;  Laterality: N/A;   LEFT HEART CATH AND CORONARY ANGIOGRAPHY N/A 01/10/2019   Procedure: LEFT HEART CATH AND CORONARY ANGIOGRAPHY;  Surgeon: Marykay Lex, MD;  Location: [facility] INVASIVE CV LAB;  Service: Cardiovascular;  Laterality: N/A;   LUMBAR LAMINECTOMY Bilateral 04/05/2016   L2-L5    VENTRAL HERNIA REPAIR  2018     Social History:   reports that he quit smoking about 3 years ago. His smoking use included cigarettes. He has a 30.00 pack-year smoking history. He has quit using smokeless tobacco. He reports current alcohol use. He reports that he does not use drugs.   Family History:  His family history includes Alcohol abuse in his brother and father; Anxiety disorder in his mother; Colon cancer in his father; Depression in his brother; Throat cancer in his brother.   Allergies Allergies  Allergen Reactions   Naproxen Other (See Comments)    (Naprosyn *ANALGESICS - ANTI-INFLAMMATORY*) Nausea, Abdominal pain   Silodosin Other (See Comments)    Low blood pressure     Home Medications  Prior to Admission medications   Medication Sig Start Date End Date Taking? Authorizing Provider  apixaban (ELIQUIS) 5 MG TABS tablet Take 1 tablet (5 mg total) by mouth 2 (two) times daily. 09/08/22   Burnadette Pop, MD  APIXABAN Everlene Balls) VTE STARTER PACK (10MG  AND 5MG ) Take 2 tablets (10 mg total) by mouth 2 (two) times daily for 7 days. THEN take 1 tablet (5 mg total) by mouth twice a day. BEGIN WITH EVENING DOSE OF DAY 1 OF STARTER PACK 09/01/22   Burnadette Pop, MD  atorvastatin (LIPITOR) 80 MG tablet Take 1 tablet (80 mg total) by mouth daily. 04/28/22   Georgeanna Lea, MD  buPROPion (WELLBUTRIN XL) 300 MG 24 hr tablet Take 1 tablet (300 mg total) by mouth daily. 08/20/22   Joan Flores, NP  busPIRone (BUSPAR) 15 MG tablet Take 1 tablet (15 mg total) by mouth 3 (three) times daily. 08/20/22   Joan Flores, NP  Cholecalciferol 25 MCG (1000 UT) tablet Take 1,000 Units by mouth daily.     [provider]  docusate sodium (COLACE) 100 MG capsule Take 100 mg by mouth daily.    [provider]  famotidine (PEPCID) 40 MG tablet TAKE 1 TABLET(40 MG) BY MOUTH AT BEDTIME Patient taking differently: Take 40 mg by mouth at bedtime. 08/11/22   Georgeanna Lea, MD  fexofenadine (ALLEGRA) 180 MG tablet Take 180 mg by mouth at bedtime.  04/09/08   [provider]  finasteride (PROSCAR) 5 MG tablet Take 1 tablet (5 mg total) by mouth daily. 03/10/21   Georgeanna Lea, MD  Ginger, Zingiber officinalis, (GINGER ROOT) 550 MG CAPS Take 550 mg by mouth daily.    [provider]  lamoTRIgine (LAMICTAL) 100 MG tablet Take 1 tablet (100 mg total) by mouth daily. 09/14/22   Joan Flores, NP  lamoTRIgine (LAMICTAL) 25 MG tablet Take one tablet by mouth 25 mg daily for 14 days, then take two tablets 50 mg total daily. Patient taking differently: Take 25 mg by mouth daily. started 25mg  08/22/22 08/20/22   Joan Flores, NP  Lidocaine 4 % LOTN Use as needed in the bilateral lower extremities Patient taking differently: Apply 1 application  topically as needed (LE pain). Use as needed in the bilateral lower extremities 03/01/22   Windell Norfolk, MD  LORazepam (ATIVAN) 0.5 MG tablet Take one tablet by mouth only for severe anxiety or agitation Patient taking differently: Take 0.5 mg by mouth daily as needed for anxiety. Take one tablet by mouth only for severe anxiety or agitation. Has not taking medication yet. 08/20/22   White,  Watt Climes, NP  melatonin 5 MG TABS Take 10 mg by mouth at bedtime.    [provider]  Multiple  Vitamins-Minerals (PRESERVISION AREDS PO) Take 1 capsule by mouth in the morning and at bedtime.    [provider]  nitroGLYCERIN (NITROSTAT) 0.4 MG SL tablet Place 1 tablet (0.4 mg total) under the tongue every 5 (five) minutes as needed for chest pain. 03/10/21   Georgeanna Lea, MD  ondansetron (ZOFRAN) 4 MG tablet Take 1 tablet (4 mg total) by mouth every 8 (eight) hours as needed for nausea or vomiting. 09/08/22   [name], [name], [name]  pantoprazole (PROTONIX) 40 MG tablet TAKE 1 TABLET(40 MG) BY MOUTH DAILY Patient taking differently: Take 40 mg by mouth daily. 07/01/22   Lynann Bologna, MD  Pirfenidone (ESBRIET) 801 MG TABS Take 1 tablet (801 mg total) by mouth 3 (three) times daily. 06/24/22   Kalman Shan, MD  Polyethylene Glycol 3350 (MIRALAX PO) Take 17 g by mouth daily.    [provider]  pregabalin (LYRICA) 150 MG capsule Take 1 capsule (150 mg total) by mouth 2 (two) times daily. 07/15/22   Windell Norfolk, MD  ranolazine (RANEXA) 1000 MG SR tablet Take 1 tablet (1,000 mg total) by mouth 2 (two) times daily. 05/27/22   Georgeanna Lea, MD  sertraline (ZOLOFT) 100 MG tablet Take 1.5 tablets (150 mg total) by mouth at bedtime. 06/28/22   Joan Flores, NP  Sodium Chloride Flush (NORMAL SALINE FLUSH) 0.9 % SOLN Flush drain with 5 mLs daily. Discard remaining 5ml in each syringe. 09/01/22   Burnadette Pop, MD  traZODone (DESYREL) 50 MG tablet Take 1 tablet (50 mg total) by mouth at bedtime as needed for sleep (May take 1/2 tab to 1 tablet for sleep, as needed.). 06/28/22   Joan Flores, NP  umeclidinium-vilanterol (ANORO ELLIPTA) 62.5-25 MCG/ACT AEPB Inhale 1 puff into the lungs daily. 09/02/22   Burnadette Pop, MD  vitamin B-12 (CYANOCOBALAMIN) 1000 MCG tablet Take 1,000 mcg by mouth daily.    [provider]  Vitamin D, Ergocalciferol, (DRISDOL) 1.25 MG (50000 UNIT) CAPS capsule Take 1 capsule (50,000 Units total) by mouth every 7 (seven) days. 05/19/22    [name], [name], [name]  zolpidem (AMBIEN) 5 MG tablet Take 1 tablet (5 mg total) by mouth at bedtime as needed for sleep. 06/15/22 12/12/22  Windell Norfolk, MD     Critical care time: na     Tessie Fass MSN, AGACNP-BC [facility] Pulmonary/Critical Care Medicine Amion for pager  09/17/2022, 1:13 PM

## 2022-09-17 NOTE — H&P (Signed)
History and Physical      Robert Lang ZOX:[id number] DOB: June 24, 1950 DOA: 09/16/2022; DOS: 09/17/2022  PCP: [name], [name]  Patient coming from: home   I have personally briefly reviewed patient's old medical records in [facility]  Chief Complaint: Shortness of breath  HPI: Robert Lang is a 72 y.o. male with medical history significant for 85 pulmonary fibrosis, COPD, recent pulmonary embolism, recent cholecystitis, paroxysmal atrial fibrillation chronically anticoagulated on Eliquis, hyperlipidemia, allergic rhinitis, who is admitted to [facility] on 09/16/2022 by way of transfer from [facility] with severe sepsis due to aspiration pneumonia after presenting from home to the latter facility complaining of shortness of breath.  The patient reports 3 to 4 days of progressive shortness of breath associate with new onset nonproductive cough.  Is been associated with subjective fever in the absence of chills, full body rigors, or generalized myalgias.  He notes that his shortness of breath is not associate with any orthopnea, PND, or worsening of peripheral edema.  He also denies any associated chest pain, palpitations, diaphoresis, dizziness, presyncope, or syncope.  No recent trauma.  Was recently hospitalized from 08/29/2022 to 09/01/2022 with acute hypoxic respiratory failure, suspected to be on the basis of acute COPD exacerbation.  That hospital course was complicated by diagnosis of acute cholecystitis, for which the patient underwent cholecystostomy tube.  Also received IV antibiotics during this previous hospitalization for his acute cholecystitis.  The patient notes that he vomited multiple times in the setting of his recent diagnosis of acute cholecystitis.  Denies any recent diarrhea, melena, or hematochezia.  Over the last few days, he notes perpetuation of mild crampy abdominal discomfort, worse over the right upper quadrant, intensifying with  palpation over the right upper quadrant.  He confirms a history of hepatic pulmonary fibrosis, COPD, but denies any known baseline supplemental oxygen requirements.  Follows with pulmonology as an outpatient.  He also has a history of paroxysmal atrial fibrillation for which she is chronically anticoagulated on Eliquis.  Most recent echocardiogram occurred on 08/29/2022 and was notable for LVEF 66 5%, indeterminate diastolic parameters, normal right ventricular systolic function, no evidence of significant valvular pathology.  He also was recently diagnosed with a subsegmental pulmonary emboli involving the left lower lobe.  The patient reports good interval compliance on home Eliquis.  In the setting of his recent abdominal discomfort, he underwent outpatient cholangiogram via interventional radiology on 09/16/2022, confirming appropriate placement of his cholecystostomy tube, with cholangiogram demonstrating no evidence of associated complication.    [facility] ED Course:  Vital signs in the ED were notable for the following: Afebrile; heart rates in the 90s to 120s; systolic blood pressures in the low 100s to 120s; respiratory rate 17-29, initial oxygen saturation 88% on room air, Sos[place] improving into the range of 92 to 94% on 2 L nasal cannula.  Labs were notable for the following: CMP notable for sodium 135, bicarbonate 24, creatinine 1.05, liver enzymes within the limits.  Lipase 66 compared to most recent prior lipase data point of 133 on 09/13/2022.  Lactic acid 2.0, with repeat lactic acid level currently pending.  CBC notable for white blood cell count 14,700, hemoglobin 12.8.  Urinalysis showed no blood cells and was leukocyte esterase/nitrate negative.  Blood cultures x 2 were collected prior to initiation of IV antibiotics.  Per my interpretation, EKG in ED demonstrated the following: Comparison to most recent prior EKG performed on 08/28/2022, presenting EKG shows sinus  tachycardia with right bundle branch block, heart rate 120, no evidence of T wave changes, also showing nonspecific less than 1 mm ST depression in V2, will demonstrating no evidence of interval ST elevation.  Imaging and additional notable ED work-up: CTA chest showed interval clearance of previously noted subsegmental emboli in the left lower lobe, while showing the presence of a single small volume 6 to will embolus in the lingula, and showing trace bilateral pleural effusions as well as interval development of airspace opacities in the bilateral lower lobes concerning for pneumonia, in the absence of evidence of edema and in the absence of any evidence of pneumothorax.  Additionally, CT abdomen/pelvis with contrast, performed radiology read, showed the presence of indwelling cholecystostomy tube without evidence of complication, and otherwise demonstrating no evidence of acute intra-abdominal or acute intrapelvic process.  EDP at [facility] discussed patient's case and CTA results with the on-call pulmonologist, Dr. Marchelle Gearing, Who recommended continuation of Eliquis for the subsegmental embolus noted in the lingula, and also felt that the airspace opacities in the bilateral lower lobes appear consistent with aspiration pneumonia.  While in the ED, the following were administered: Morphine 4 mg IV x 1, Zofran 4 mg IV x 2, cefepime, IV vancomycin, normal saline 500 cc bolus, fentanyl 50 mcg IV x 2 doses.  Subsequently, the patient was admitted to [facility] for further evaluation management of presenting severe sepsis due to suspected aspiration pneumonia, complicated by acute hypoxic respiratory distress.    Review of Systems: As per HPI otherwise 10 point review of systems negative.   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 M75.00)   Allergic rhinitis due to pollen 09/03/2013   J30.1)  Formatting of this note might be different from the original. J30.1)   Allergy    Anticoagulated 07/01/2016   Anxiety    Arthritis    Atrial fibrillation (HCC)    Atrial flutter (HCC) 06/26/2015   CAD (coronary artery disease)    Chest pain 06/26/2015   COPD (chronic obstructive pulmonary disease) (HCC)    Coronary artery disease 07/06/2018   Cardiac catheterization 2017 showing 90% small diagonal branch disease   DDD (degenerative disc disease), cervical 09/03/2013   M50.90)  Formatting of this note might be different from the original. M50.90)   Depression    Depression    Depressive disorder 09/03/2013   Dizziness 10/28/2017   Dyslipidemia, goal LDL below 70 07/06/2018   Dyspnea on exertion 10/20/2018   Esophageal reflux 09/03/2013   Essential hypertension 07/06/2018   ETOH abuse 01/11/2019   6 pack of beer per day   Falls 10/28/2017   GERD (gastroesophageal reflux disease)    H/O amiodarone therapy 07/01/2016   Heart attack (HCC)    Heart palpitations 01/27/2017   History of colon polyps    Hyperlipidemia    Hypertension    IPF (idiopathic pulmonary fibrosis) (HCC)    Kidney stones    Neck pain 09/03/2013   Neuropathy 07/06/2018   Obstructive sleep apnea 08/05/2015   PLMD (periodic limb movement disorder) 11/04/2015   Polycythemia, 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 STORY: Due to alcohol/ tobacco   Pure hypercholesterolemia 09/03/2013   E78.0)  Formatting of this  note might be different from the original. E78.0)   Screening for prostate cancer 09/03/2013   Smoker 01/11/2019   Smoking 07/06/2018   Status post ablation of atrial flutter 07/06/2018   2017    Past Surgical History:  Procedure Laterality Date   ATRIAL FIBRILLATION ABLATION  10/2015   BACK SURGERY      CARDIOVERSION  2017   CATARACT EXTRACTION Bilateral 2017   March and April 2017   COLONOSCOPY  2018   CORONARY STENT PLACEMENT  2012   CORONARY/GRAFT ACUTE MI REVASCULARIZATION N/A 01/10/2019   Procedure: CORONARY/GRAFT ACUTE MI REVASCULARIZATION;  Surgeon: Marykay Lex, MD;  Location: [facility] INVASIVE CV LAB;  Service: Cardiovascular;  Laterality: N/A;   INGUINAL HERNIA REPAIR     over 20 years ago   IR CHOLANGIOGRAM EXISTING TUBE  09/16/2022   IR PERC CHOLECYSTOSTOMY  08/30/2022   LAPAROSCOPIC APPENDECTOMY N/A 01/17/2021   Procedure: APPENDECTOMY LAPAROSCOPIC;  Surgeon: Quentin Ore, MD;  Location: [facility] OR;  Service: General;  Laterality: N/A;   LEFT HEART CATH AND CORONARY ANGIOGRAPHY N/A 01/10/2019   Procedure: LEFT HEART CATH AND CORONARY ANGIOGRAPHY;  Surgeon: Marykay Lex, MD;  Location: [facility] INVASIVE CV LAB;  Service: Cardiovascular;  Laterality: N/A;   LUMBAR LAMINECTOMY Bilateral 04/05/2016   L2-L5    VENTRAL HERNIA REPAIR  2018    Social History:  reports that he quit smoking about 3 years ago. His smoking use included cigarettes. He has a 30.00 pack-year smoking history. He has quit using smokeless tobacco. He reports current alcohol use. He reports that he does not use drugs.   Allergies  Allergen Reactions   Naproxen Other (See Comments)    (Naprosyn *ANALGESICS - ANTI-INFLAMMATORY*) Nausea, Abdominal pain   Silodosin Other (See Comments)    Low blood pressure    Family History  Problem Relation Age of Onset   Anxiety disorder Mother    Alcohol abuse Father    Colon cancer Father    Depression Brother    Alcohol abuse Brother    Throat cancer Brother     Family history reviewed and not pertinent    Prior to Admission medications   Medication Sig Start Date End Date Taking? Authorizing Provider  apixaban (ELIQUIS) 5 MG TABS tablet Take 1 tablet (5 mg total) by mouth 2 (two) times daily. 09/08/22   Burnadette Pop, MD  APIXABAN Everlene Balls) VTE STARTER  PACK (10MG  AND 5MG ) Take 2 tablets (10 mg total) by mouth 2 (two) times daily for 7 days. THEN take 1 tablet (5 mg total) by mouth twice a day. BEGIN WITH EVENING DOSE OF DAY 1 OF STARTER PACK 09/01/22   Burnadette Pop, MD  atorvastatin (LIPITOR) 80 MG tablet Take 1 tablet (80 mg total) by mouth daily. 04/28/22   Georgeanna Lea, MD  buPROPion (WELLBUTRIN XL) 300 MG 24 hr tablet Take 1 tablet (300 mg total) by mouth daily. 08/20/22   Joan Flores, NP  busPIRone (BUSPAR) 15 MG tablet Take 1 tablet (15 mg total) by mouth 3 (three) times daily. 08/20/22   Joan Flores, NP  Cholecalciferol 25 MCG (1000 UT) tablet Take 1,000 Units by mouth daily.     [provider]  docusate sodium (COLACE) 100 MG capsule Take 100 mg by mouth daily.    [provider]  famotidine (PEPCID) 40 MG tablet TAKE 1 TABLET(40 MG) BY MOUTH AT BEDTIME Patient taking differently: Take 40 mg by mouth at bedtime. 08/11/22   Bing Matter,  Marveen Reeks, MD  fexofenadine (ALLEGRA) 180 MG tablet Take 180 mg by mouth at bedtime.  04/09/08   [provider]  finasteride (PROSCAR) 5 MG tablet Take 1 tablet (5 mg total) by mouth daily. 03/10/21   Georgeanna Lea, MD  Ginger, Zingiber officinalis, (GINGER ROOT) 550 MG CAPS Take 550 mg by mouth daily.    [provider]  lamoTRIgine (LAMICTAL) 100 MG tablet Take 1 tablet (100 mg total) by mouth daily. 09/14/22   Joan Flores, NP  lamoTRIgine (LAMICTAL) 25 MG tablet Take one tablet by mouth 25 mg daily for 14 days, then take two tablets 50 mg total daily. Patient taking differently: Take 25 mg by mouth daily. started 25mg  08/22/22 08/20/22   Joan Flores, NP  Lidocaine 4 % LOTN Use as needed in the bilateral lower extremities Patient taking differently: Apply 1 application  topically as needed (LE pain). Use as needed in the bilateral lower extremities 03/01/22   Windell Norfolk, MD  LORazepam (ATIVAN) 0.5 MG tablet Take one tablet by mouth only for severe  anxiety or agitation Patient taking differently: Take 0.5 mg by mouth daily as needed for anxiety. Take one tablet by mouth only for severe anxiety or agitation. Has not taking medication yet. 08/20/22   White, Arlys John A, NP  melatonin 5 MG TABS Take 10 mg by mouth at bedtime.    [provider]  Multiple Vitamins-Minerals (PRESERVISION AREDS PO) Take 1 capsule by mouth in the morning and at bedtime.    [provider]  nitroGLYCERIN (NITROSTAT) 0.4 MG SL tablet Place 1 tablet (0.4 mg total) under the tongue every 5 (five) minutes as needed for chest pain. 03/10/21   Georgeanna Lea, MD  ondansetron (ZOFRAN) 4 MG tablet Take 1 tablet (4 mg total) by mouth every 8 (eight) hours as needed for nausea or vomiting. 09/08/22   [name], [name], [name]  pantoprazole (PROTONIX) 40 MG tablet TAKE 1 TABLET(40 MG) BY MOUTH DAILY Patient taking differently: Take 40 mg by mouth daily. 07/01/22   Lynann Bologna, MD  Pirfenidone (ESBRIET) 801 MG TABS Take 1 tablet (801 mg total) by mouth 3 (three) times daily. 06/24/22   Kalman Shan, MD  Polyethylene Glycol 3350 (MIRALAX PO) Take 17 g by mouth daily.    [provider]  pregabalin (LYRICA) 150 MG capsule Take 1 capsule (150 mg total) by mouth 2 (two) times daily. 07/15/22   Windell Norfolk, MD  ranolazine (RANEXA) 1000 MG SR tablet Take 1 tablet (1,000 mg total) by mouth 2 (two) times daily. 05/27/22   Georgeanna Lea, MD  sertraline (ZOLOFT) 100 MG tablet Take 1.5 tablets (150 mg total) by mouth at bedtime. 06/28/22   Joan Flores, NP  Sodium Chloride Flush (NORMAL SALINE FLUSH) 0.9 % SOLN Flush drain with 5 mLs daily. Discard remaining 5ml in each syringe. 09/01/22   Burnadette Pop, MD  traZODone (DESYREL) 50 MG tablet Take 1 tablet (50 mg total) by mouth at bedtime as needed for sleep (May take 1/2 tab to 1 tablet for sleep, as needed.). 06/28/22   Joan Flores, NP  umeclidinium-vilanterol (ANORO ELLIPTA) 62.5-25 MCG/ACT AEPB Inhale 1  puff into the lungs daily. 09/02/22   Burnadette Pop, MD  vitamin B-12 (CYANOCOBALAMIN) 1000 MCG tablet Take 1,000 mcg by mouth daily.    [provider]  Vitamin D, Ergocalciferol, (DRISDOL) 1.25 MG (50000 UNIT) CAPS capsule Take 1 capsule (50,000 Units total) by mouth every 7 (seven) days.  05/19/22   [name], [name], [name]  zolpidem (AMBIEN) 5 MG tablet Take 1 tablet (5 mg total) by mouth at bedtime as needed for sleep. 06/15/22 12/12/22  Windell Norfolk, MD     Objective    Physical Exam: Vitals:   09/16/22 1930 09/16/22 2230 09/17/22 0000 09/17/22 0050  BP: 126/73 111/66 107/67 101/60  Pulse: (!) 120 (!) 103 99 97  Resp: (!) 23 20 (!) 23 17  Temp:    97.9 F (36.6 C)  TempSrc:    Oral  SpO2: 92% 94% 93% 94%    General: appears to be stated age; alert, oriented; mildly increased work of breathing noted Skin: warm, dry, no rash Head:  AT/[place]ma Mouth:  Oral mucosa membranes appear moist, normal dentition Neck: supple; trachea midline Heart:  RRR; did not appreciate any M/R/G Lungs: CTAB, did not appreciate any wheezes, rales, or rhonchi Abdomen: + BS; soft, ND, cholecystostomy tube Vascular: 2+ pedal pulses b/l; 2+ radial pulses b/l Extremities: no peripheral edema, no muscle wasting Neuro: strength and sensation intact in upper and lower extremities b/l      Labs on Admission: I have personally reviewed following labs and imaging studies  CBC: Recent Labs  Lab 09/13/22 1000 09/16/22 1825 09/17/22 0122  WBC 7.2 14.7* 11.5*  NEUTROABS 4.6  --  8.2*  HGB 14.3 15.8 13.5  HCT 43.8 48.1 42.7  MCV 94.6 94.1 97.7  PLT 219.0 200 165   Basic Metabolic Panel: Recent Labs  Lab 09/13/22 1000 09/16/22 1825 09/17/22 0122  NA 136 135 132*  K 4.5 4.4 3.9  CL 104 99 101  CO2 24 24 23   GLUCOSE 119* 118* 128*  BUN 11 12 12   CREATININE 0.87 1.05 0.94  CALCIUM 8.8 9.0 8.1*  MG  --   --  2.0  PHOS  --   --  3.6   GFR: Estimated Creatinine Clearance: 75.7 mL/min (by  C-G formula based on SCr of 0.94 mg/dL). Liver Function Tests: Recent Labs  Lab 09/13/22 1000 09/16/22 1825 09/17/22 0122  AST 16 25 18   ALT 23 29 22   ALKPHOS 59 67 48  BILITOT 0.5 0.8 0.7  PROT 6.3 7.6 6.4*  ALBUMIN 3.5 3.7 2.9*   Recent Labs  Lab 09/13/22 1000 09/16/22 1825  LIPASE 133.0* 66*   No results for input(s): "AMMONIA" in the last 168 hours. Coagulation Profile: No results for input(s): "INR", "PROTIME" in the last 168 hours. Cardiac Enzymes: No results for input(s): "CKTOTAL", "CKMB", "CKMBINDEX", "TROPONINI" in the last 168 hours. BNP (last 3 results) No results for input(s): "PROBNP" in the last 8760 hours. HbA1C: No results for input(s): "HGBA1C" in the last 72 hours. CBG: No results for input(s): "GLUCAP" in the last 168 hours. Lipid Profile: No results for input(s): "CHOL", "HDL", "LDLCALC", "TRIG", "CHOLHDL", "LDLDIRECT" in the last 72 hours. Thyroid Function Tests: No results for input(s): "TSH", "T4TOTAL", "FREET4", "T3FREE", "THYROIDAB" in the last 72 hours. Anemia Panel: No results for input(s): "VITAMINB12", "FOLATE", "FERRITIN", "TIBC", "IRON", "RETICCTPCT" in the last 72 hours. Urine analysis:    Component Value Date/Time   COLORURINE YELLOW 09/16/2022 2300   APPEARANCEUR CLEAR 09/16/2022 2300   LABSPEC 1.010 09/16/2022 2300   PHURINE 6.5 09/16/2022 2300   GLUCOSEU NEGATIVE 09/16/2022 2300   HGBUR NEGATIVE 09/16/2022 2300   BILIRUBINUR NEGATIVE 09/16/2022 2300   BILIRUBINUR Negative 09/06/2018 1023   KETONESUR NEGATIVE 09/16/2022 2300   PROTEINUR NEGATIVE 09/16/2022 2300   UROBILINOGEN negative (A) 09/06/2018 1023   NITRITE  NEGATIVE 09/16/2022 2300   LEUKOCYTESUR NEGATIVE 09/16/2022 2300    Radiological Exams on Admission: CT Angio Chest PE W and/or Wo Contrast  Result Date: 09/16/2022 CLINICAL DATA:  Pulmonary embolism suspected. High probability. New onset hypoxia and shortness of breath. Also severe abdominal pain. Recently  discharged after treatment for small subsegmental arterial emboli in the left lower lobe. EXAM: CT ANGIOGRAPHY CHEST WITH CONTRAST TECHNIQUE: Multidetector CT imaging of the chest was performed using the standard protocol during bolus administration of intravenous contrast. Multiplanar CT image reconstructions and MIPs were obtained to evaluate the vascular anatomy. RADIATION DOSE REDUCTION: This exam was performed according to the departmental dose-optimization program which includes automated exposure control, adjustment of the mA and/or kV according to patient size and/or use of iterative reconstruction technique. CONTRAST:  OMNIPAQUE IOHEXOL 350 MG/ML SOLN COMPARISON:  Portable chest today, CTA chest 08/29/2022, CTA chest 08/25/2021 FINDINGS: Cardiovascular: Previously the patient had scattered subsegmental left lower lobe arterial filling defects. These are no longer seen. There is a small-volume embolic filling defect within a single lingular subsegmental artery on series 2 axial 43 and 44 and also visible on series 5 images 63-65. No further emboli are detected. There is panchamber cardiomegaly with a slight right chamber predominance which was seen previously but no IVC reflux to suspect acute right heart strain. There is no pericardial effusion. There are heavy calcifications in the left main and LAD coronary arteries, additional calcifications and stenting in the right coronary artery and small amount of calcification in the proximal circumflex artery. Lipomatous hypertrophy of the inter-atrial septum is chronically noted as well. The pulmonary veins are nondistended. The aorta and great vessels are tortuous but normal caliber. No dissection is seen. There is moderate patchy aortic calcific plaque, less heavy scattered plaque in the great vessels. Mediastinum/Nodes: No enlarged mediastinal, hilar, or axillary lymph nodes. Thyroid gland, trachea, and esophagus demonstrate no significant findings.  Lungs/Pleura: Bilateral trace pleural effusions are similar to the last CT. No pneumothorax. There are opacities in both posterior basal lower lobes, noted to varying degrees on both prior studies, bilaterally increased today and could indicate increased atelectasis or developing pneumonia. There is a chronically elevated right hemidiaphragm and mild posterior atelectasis in the upper lobes. The lungs are otherwise clear. No nodules are seen. The main bronchi are patent. There is chronic bronchial thickening in the lower lobes. Upper Abdomen: A percutaneous cholecystostomy drain is noted. The liver is steatotic and mildly enlarged. No other significant findings in the visualized upper abdomen. Musculoskeletal: Osteopenia and degenerative change of the spine. No acute osseous findings. No aggressive regional bone lesion. No chest wall mass is seen. Review of the MIP images confirms the above findings. IMPRESSION: 1. Positive for a single small-volume subsegmental embolus in the lingula. No other emboli are seen. 2. Subsegmental emboli in the left lower lobe noted previously have cleared in the interval. 3. Cardiomegaly without evidence of acute right heart strain or venous dilatation. Chronic slight right chamber predominance. 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 4. Aortic and coronary artery atherosclerosis and lipomatous hypertrophy of the inter-atrial septum. 5. Trace pleural effusions with increased opacities in the posterior basal lower lobes which could be atelectasis or developing pneumonia. 6. Chronic lower lobe bronchial thickening. 7. Percutaneous cholecystostomy drain. 8. Hepatic steatosis and mild hepatomegaly. 9. Osteopenia and degenerative change. Aortic Atherosclerosis (ICD10-I70.0). Electronically Signed   By: Almira Bar M.D.   On: 09/16/2022 20:53   CT ABDOMEN PELVIS W CONTRAST  Result Date: 09/16/2022  CLINICAL DATA:  Right lower quadrant abdominal pain since yesterday, history of  cholecystostomy tube EXAM: CT ABDOMEN AND PELVIS WITH CONTRAST TECHNIQUE: Multidetector CT imaging of the abdomen and pelvis was performed using the standard protocol following bolus administration of intravenous contrast. RADIATION DOSE REDUCTION: This exam was performed according to the departmental dose-optimization program which includes automated exposure control, adjustment of the mA and/or kV according to patient size and/or use of iterative reconstruction technique. CONTRAST:  OMNIPAQUE IOHEXOL 350 MG/ML SOLN COMPARISON:  09/16/2022, 08/29/2022 FINDINGS: Lower chest: Bibasilar hypoventilatory changes. No acute pleural or parenchymal lung disease. Atherosclerosis of the aorta and coronary vasculature. Hepatobiliary: Stable hemangioma right lobe liver. Otherwise the liver is unremarkable. Percutaneous cholecystostomy tube is seen coiled within the gallbladder lumen, with complete decompression of the gallbladder. There is mild pericholecystic fat stranding which may be related history of cholecystitis or indwelling drain. No biliary duct dilation. Pancreas: Unremarkable. No pancreatic ductal dilatation or surrounding inflammatory changes. Spleen: Normal in size without focal abnormality. Adrenals/Urinary Tract: Calcifications of the bilateral renal hila are likely vascular. No urinary tract calculi or obstructive uropathy. Kidneys enhance normally. The adrenals and bladder are unremarkable. Stomach/Bowel: No bowel obstruction or ileus. Diverticulosis of the descending and sigmoid colon without diverticulitis. Prior appendectomy. No bowel wall thickening. Vascular/Lymphatic: Aortic atherosclerosis. No enlarged abdominal or pelvic lymph nodes. Reproductive: Prostate is unremarkable. Other: No free fluid or free intraperitoneal gas. No abdominal wall hernia. Musculoskeletal: No acute or destructive bony abnormalities. Reconstructed images demonstrate no additional findings. IMPRESSION: 1. Indwelling  cholecystostomy tube without evidence of complication. 2. Diverticulosis without diverticulitis. 3.  Aortic Atherosclerosis (ICD10-I70.0). Electronically Signed   By: Sharlet Salina M.D.   On: 09/16/2022 20:36   DG Abdomen 1 View  Result Date: 09/16/2022 CLINICAL DATA:  Abdominal pain and chills. EXAM: ABDOMEN - 1 VIEW COMPARISON:  None Available. FINDINGS: The bowel gas pattern is normal. A percutaneous biliary drainage catheter is seen overlying the right upper quadrant. No radio-opaque calculi or other significant radiographic abnormality are seen. IMPRESSION: Percutaneous biliary drainage catheter overlying the right upper quadrant. Electronically Signed   By: Aram Candela M.D.   On: 09/16/2022 19:30   DG Chest Portable 1 View  Result Date: 09/16/2022 CLINICAL DATA:  Abdominal pain and chills EXAM: PORTABLE CHEST 1 VIEW COMPARISON:  August 28, 2022 FINDINGS: The heart size and mediastinal contours are within normal limits. There is moderate severity calcification of the aortic arch. Low lung volumes are noted with mild, diffuse, chronic appearing increased interstitial lung markings. Mild atelectasis is seen within the bilateral lung bases. There is no evidence of a pleural effusion or pneumothorax. A biliary drainage catheter is seen overlying the lateral aspect of the right upper quadrant. Multilevel degenerative changes are seen throughout the thoracic spine. IMPRESSION: Low lung volumes with mild bibasilar atelectasis. Electronically Signed   By: Aram Candela M.D.   On: 09/16/2022 19:29   IR CHOLANGIOGRAM EXISTING TUBE  Result Date: 09/16/2022 INDICATION: History of acute cholecystitis, post ultrasound cholecystostomy tube placement on 08/30/2022. EXAM: CHOLECYSTOSTOMY TUBE EVALUATION and ANTEGRADE CHOLANGIOGRAM COMPARISON:  IR fluoroscopy, 08/30/2022.  CTA chest, 08/29/2022. MEDICATIONS: None ANESTHESIA/SEDATION: None CONTRAST:  5mL OMNIPAQUE IOHEXOL 300 MG/ML SOLN - administered into the  gallbladder fossa. FLUOROSCOPY TIME:  Fluoroscopic dose; 4 mGy COMPLICATIONS: None immediate. PROCEDURE: The patient was positioned supine on the fluoroscopy table. A preprocedural spot fluoroscopic image was obtained of the right upper abdominal quadrant existing cholecystostomy tube. Multiple spot fluoroscopic radiographic images were obtained of  the RIGHT upper abdominal quadrant an existing cholecystostomy tube following injection of a small amount of contrast. Images were reviewed and discussed with the patient. The cholecystostomy tube was flushed with a small amount of saline. The previous stitch was removed, and a new 2-0 Ethilon stitch was placed. A dressing was placed. The patient tolerated the procedure well without immediate postprocedural complication. Procedure assisted by, Loran Senters IR RT FINDINGS: Preprocedural spot fluoroscopic image of the right upper abdominal quadrant demonstrates grossly unchanged positioning of the cholecystostomy tube with end coiled and locked overlying the expected location of the gallbladder fundus. Subsequent contrast injection demonstrates appropriate functionality of the cholecystostomy tube with brisk opacification of the gallbladder. There is passage of contrast from the gallbladder through the cystic and common bile ducts the level duodenum. No biliary ductal filling defect to suggest the presence of choledocholithiasis. IMPRESSION: 1. Appropriately positioned and functioning cholecystostomy tube. No exchange performed. 2. Patent cystic and common bile ducts. No fluoroscopic evidence of choledocholithiasis. PLAN: The patient will return to Vascular Interventional Radiology (VIR) for routine drainage catheter evaluation and exchange in 6 weeks (from catheter placement). Roanna Banning, MD [facility] Radiology Electronically Signed   By: Roanna Banning M.D.   On: 09/16/2022 14:15      Assessment/Plan    Principal  Problem:   Aspiration pneumonia of both lower lobes (HCC) Active Problems:   IPF (idiopathic pulmonary fibrosis) (HCC)   Hyperlipidemia   Depression   Paroxysmal atrial fibrillation (HCC)   Severe sepsis (HCC)   Acute respiratory failure with hypoxia (HCC)   History of COPD   History of pulmonary embolism   History of cholecystitis   GAD (generalized anxiety disorder)   Allergic rhinitis   BPH (benign prostatic hyperplasia)     #) Severe sepsis due to aspiration pneumonia: Diagnosis on the basis of 3 to 4 days of progressive shortness of breath associated with cough, subjective fever, with CT a chest showing interval development of infiltrates in the bilateral lower lobes consistent with aspiration, per pulmonology review of this imaging as well as in the context of recent history of multiple episodes of vomiting.  Additionally, in the context of presenting pneumonia in the setting of recent hospitalization during which the patient received IV antibiotics, he is at increased risk for resistant microbial infection.  Consequently, will continue with the broad-spectrum IV antibiotics initiated [facility], including provision of coverage for MRSA as well as Pseudomonas in the form of continuation of IV vancomycin and cefepime, respectively.  Will add IV Flagyl for anaerobic coverage given suspected component of aspiration.  SIRS criteria met via leukocytosis, tachycardia, tachypnea. Lactic acid level: Elevated at 2.0, with repeat level currently pending.. Of note, given the associated presence of suspected end organ damage in the form of concominant presenting lactic acidosis as well as acute hypoxic respiratory distress, criteria are met for pt's sepsis to be considered severe in nature. However, in the absence of lactic acid level that is greater than or equal to 4.0, and in the absence of any associated hypotension refractory to IVF's, there are no indications for administration of a  30 mL/kg IVF bolus at this time.   Additional ED work-up/management notable for: Blood cultures x 2 collected followed by initiation of IV vancomycin and cefepime.  No e/o additional infectious process at this time, including urinalysis that was inconsistent with UTI.   Plan: CBC w/ diff and CMP in AM.  Follow for results of  blood cx's x 2. Abx: IV vancomycin, cefepime, and Flagyl, as above.  Continuous normal saline at 75 cc/h x 12 hours.  Follow-up result of repeat lactic acid level.  Add on procalcitonin level.  Check strep pneumonia urine antigen.  Keep n.p.o. for now with the exception of sips with medications, with order placed for SLP consult in the morning for swallow evaluation given concern for element of aspiration. Check MRSA PCR, which, if negative, can consider discontinuation of IV vancomycin given the high negative predictive value for MRSA pneumonia associated with such as result.  Flutter valve, incentive spirometry.  Prn Tessalon Perles.  Prn acetaminophen for fever.               #) Acute hypoxic respiratory distress: in the context of acute respiratory symptoms and no known baseline supplemental O2 requirements, presenting O2 sat note to be in the high 80s on room air, [place] moving into the mid 90s on 2 L nasal cannula, thereby meeting criteria for acute hypoxic respiratory distress as opposed to acute hypoxic respiratory failure at this time. Appears to be on basis of presenting aspiration pneumonia identified on today CTA chest.  Subsegmental pulmonary embolus noted to be a small volume, felt to be less likely contributory to acute hypoxic respiratory distress given not only a small size but subsegmental distribution .  Of note, no clinical or radiographic evidence to suggest acutely decompensated heart failure at this time, while CTA chest also showed no evidence of pneumothorax.  Patient is at risk for acute exacerbation of underlying COPD in idiopathic pulmonary  fibrosis, though presentation does not appear to be overtly associated with corresponding exacerbation at this time.  Will closely monitor for ensuing development of such.  In terms of other considered etiologies, ACS appears less likely at this time in the absence of any recent CP and in the context of presenting EKG showing no e/o acute ischemic process.  There may also be a contribution from suboptimal inspiratory effort given concomitant presenting abdominal discomfort in the context of his recent diagnosis of acute cholecystitis.  Will emphasize incentive spirometry as well as adequate pain control, as outlined below.  Plan: further evaluation/management of presenting severe sepsis due to suspected aspiration pneumonia, as above. Monitor continuous pulse ox with prn supplemental O2 to maintain O2 sats greater than or equal to 92%. monitor on telemetry. CMP/CBC in the AM. Check serum Mg and Phos levels. Check blood gas. Flutter valve, incentive spirometry.  Scheduled Xopenex/ipratropium nebulizers, as needed Xopenex nebulizer.                     # (Idiopathic pulmonary fibrosis: Documented history of such, for which she is on pirfenidone as an outpatient.  Plan: Continue outpatient pirfenidone.                #) COPD: Documented history thereof, without clinical evidence of acute exacerbation at this time, although he is certainly at risk for ensuing development of such given presenting severe sepsis due to aspiration pneumonia complicated by acute Evoxac respiratory distress.   Outpatient respiratory regimen includes the following: Anora Ellipta.  Will hold the latter for now and lieu of scheduled Xopenex/ipratropium nebulizers, as further outlined below, will closely monitoring for ensuing development of acute exacerbation thereof.  Plan: Scheduled Xopenex/ipratropium nebulizers.  Prn Xopenex nebulizer. Check CMP and serum magnesium level in the AM.  Check serum  phosphorus level.  Follow-up result of blood gas.               #)  Recent diagnosis of acute pulmonary embolism: As further detailed above.  On Eliquis, with pulmonology recommended continuation of Eliquis, as further detailed above.  Plan: Continue outpatient Eliquis.              #) Recent diagnosis of cholecystitis: Status post cholecystostomy tube, with outpatient cholangiogram performed by IR on 09/16/2022 demonstrating appropriate positioning of this tube, without any associated complications.  Plan: Monitor strict I's and O's Daily weights.  Repeat CMP, CBC in the morning.  As needed IV fentanyl.              #) Paroxysmal atrial fibrillation: Documented history of such. In setting of CHA2DS2-VASc score of 5, there is an indication for chronic anticoagulation for thromboembolic prophylaxis. Consistent with this, patient is chronically anticoagulated on Eliquis. Home AV nodal blocking regimen: None.  Most recent echocardiogram was performed on 08/29/2022 was notable for LVEF 60 to 65%, as well as no evidence of significant valvular pathology, with additional results as conveyed above. Presenting EKG shows sinus tachycardia without overt evidence of acute ischemic changes.   Plan: monitor strict I's & O's and daily weights. CMP/CBC in AM. Check serum mag level.  Continue outpatient Eliquis.  Monitor on telemetry.                #) Hyperlipidemia: documented h/o such. On high intensity atorvastatin as outpatient.   Plan: continue home statin.                  #) Depression: documented h/o such. On Wellbutrin as well as Lamictal as outpatient.    Plan: Continue outpatient Lamictal and Wellbutrin.                 #) Generalized anxiety disorder: documented h/o such. On BuSpar as outpatient.    Plan: Continue outpatient BuSpar.                 #) Allergic Rhinitis: documented h/o such, on scheduled  Allegra as outpatient, in the absence of intranasal corticosteroid.    Plan: cont home Allegra.                   #) Benign Prostatic Hyperplasia:  documented h/o such; on Proscar as outpatient.   Plan: monitor strict I's & O's and daily weights. Repeat CMP in AM.  Continue outpatient Proscar.       DVT prophylaxis: SCD's + continuation of outpatient Eliquis Code Status: Full code Family Communication: none Disposition Plan: Per Rounding Team Consults called: none;  Admission status: Inpatient    I SPENT GREATER THAN 75  MINUTES IN CLINICAL CARE TIME/MEDICAL DECISION-MAKING IN COMPLETING THIS ADMISSION.     Chaney Born Lateria Alderman DO [facility] From 7PM - 7AM   09/17/2022, 2:12 AM

## 2022-09-17 NOTE — Plan of Care (Addendum)
Patient admitted 08/28/22 with acute symptomatic PE, later imaging showed concern for acute cholecystitis. Given recent PE he was not a surgical candidate and IR was consulted for percutaneous cholecystostomy placement which was performed 08/30/22 (Dr. Loreta Ave). He was discharged 09/01/22 with drain in place.  [name] returned to IR yesterday as an outpatient due to abdominal pain, chills, worsening dyspnea and no drain output. He was seen by Dr. Milford Cage and underwent cholangiogram and percutaneous cholecystotomy drain exchange. Cholangiogram showed patent cystic duct and tube to be in appropriate position. He complained of site pain at the original suture location and this was removed and replaced at a different site. He presented to the ED yesterday evening with the same symptoms as above. His drain was noted to spontaneously begin draining in the ED and patient reported some symptom improvement. He was admitted for further evaluation and IR has been consulted due to pain in relation to the cholecystostomy/retention suture.  Patient reviewed with Dr. Milford Cage who recommends treatment for cholangitis if there are systemic symptoms, otherwise continue current drain care. Additionally noted that the discomfort from the drain is expected to be temporary and likely the only way to resolve the site pain would be removal. As such there is no further studies necessary regarding the drain from an IR perspective.   Plan for patient to return for repeat cholangiogram as an outpatient 10/11/22.  IR will follow while inpatient -- continue to flush drain TID with 5 cc NS, record output every day, dressing changes every day or PRN if soiled.  Above relayed to Dr. Sunnie Nielsen via secure chat today.  Lynnette Caffey, [name]

## 2022-09-17 NOTE — Evaluation (Signed)
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 M75.00)   Allergic rhinitis due to pollen 09/03/2013   J30.1)  Formatting of this note might be different from the original. J30.1)   Allergy    Anticoagulated 07/01/2016   Anxiety    Arthritis    Atrial fibrillation (HCC)    Atrial flutter (HCC) 06/26/2015   CAD (coronary artery disease)    Chest pain 06/26/2015   COPD (chronic obstructive pulmonary disease) (HCC)    Coronary artery disease 07/06/2018   Cardiac catheterization 2017 showing 90% small diagonal branch disease   DDD (degenerative disc disease), cervical 09/03/2013   M50.90)  Formatting of this note might be different from the original. M50.90)   Depression    Depression    Depressive disorder 09/03/2013   Dizziness 10/28/2017   Dyslipidemia, goal LDL below 70 07/06/2018   Dyspnea on exertion 10/20/2018   Esophageal reflux 09/03/2013   Essential hypertension 07/06/2018   ETOH abuse 01/11/2019   6 pack of beer per day   Falls 10/28/2017   GERD (gastroesophageal reflux disease)    H/O amiodarone therapy 07/01/2016   Heart attack (HCC)    Heart palpitations 01/27/2017   History of colon polyps    Hyperlipidemia    Hypertension    IPF (idiopathic pulmonary fibrosis) (HCC)    Kidney stones    Neck pain 09/03/2013   Neuropathy 07/06/2018   Obstructive sleep apnea 08/05/2015   PLMD (periodic limb movement disorder) 11/04/2015   Polycythemia, 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 STORY: Due to alcohol/ tobacco   Pure hypercholesterolemia 09/03/2013   E78.0)  Formatting of this note might be different from the original. E78.0)   Screening for prostate cancer 09/03/2013   Smoker 01/11/2019   Smoking 07/06/2018   Status post ablation of atrial flutter 07/06/2018   2017   Past Surgical History:  Past Surgical History:  Procedure Laterality Date   ATRIAL FIBRILLATION ABLATION  10/2015   BACK SURGERY     CARDIOVERSION  2017   CATARACT EXTRACTION Bilateral 2017   March and April 2017   COLONOSCOPY  2018   CORONARY STENT PLACEMENT  2012   CORONARY/GRAFT ACUTE MI REVASCULARIZATION N/A 01/10/2019   Procedure: CORONARY/GRAFT ACUTE MI REVASCULARIZATION;  Surgeon: Marykay Lex, MD;  Location: [facility] INVASIVE CV LAB;  Service: Cardiovascular;  Laterality: N/A;   INGUINAL HERNIA REPAIR     over 20 years ago   IR CHOLANGIOGRAM EXISTING TUBE  09/16/2022   IR PERC CHOLECYSTOSTOMY  08/30/2022   LAPAROSCOPIC APPENDECTOMY N/A 01/17/2021   Procedure: APPENDECTOMY LAPAROSCOPIC;  Surgeon: Quentin Ore, MD;  Location: [facility] OR;  Service: General;  Laterality: N/A;   LEFT HEART CATH AND CORONARY ANGIOGRAPHY N/A 01/10/2019   Procedure: LEFT HEART CATH AND CORONARY ANGIOGRAPHY;  Surgeon: Marykay Lex, MD;  Location: [facility] INVASIVE CV LAB;  Service: Cardiovascular;  Laterality: N/A;   LUMBAR LAMINECTOMY Bilateral 04/05/2016   L2-L5    VENTRAL  HERNIA REPAIR  [id number]   HPI:  72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 Prior to that time, he had a 30 pack smoking history.  Pt also with h/o sleep apnea and he uses CPAP at home, COPD, pulmonary fibrosis, afib on Eliquis.  He is to have gall bladder removal soon per his statement.  Swallow eval was ordered due to concerns for  aspiration. 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   He denies any issues with reflux nor choking when eating. Does admit to dyspnea.  Reports he takes "22 pills at one time".   Wife states pt eats slowly.    Assessment / Plan / Recommendation  Clinical Impression  Pt with no focal CN deficits.  Passed 3 ounce yale swallow screen - however demonstrated erucation after and admitted to sensing reflux to pharynx. Admits to some h/o dyspnea with po.  Has h/o known LPR per ENT consult in 2022 -thus SLP educated him and his wife extensively to ENT recommendations.  Administered RSI *reflux symptom index* that assess risk for laryngopharyngeal reflux - and pt scored 25/45 - above 10 is highly indicative of laryngopharyngeal reflux. Thus provided him reflux precautions in writing and verbally using teach back. His aspiration risk in this SLPs opinion is reflux related.  Did advise pt and his wife to assure he is taking frequent rest breaks if he is dyspenic.  Did not test pt with solids because he has been on a full liquid diet *uncertain if due to gallbladder?  All education completed to help pt maximize his airway protection. No f/u needed. Thanks. SLP Visit Diagnosis: Dysphagia, unspecified (R13.10)    Aspiration Risk  No limitations    Diet Recommendation Thin liquid;Regular    Medication Administration: Whole meds with liquid Supervision: Patient able to self feed Compensations: Slow rate;Small sips/bites Postural Changes: Remain upright for at least 30 minutes after po intake;Seated upright at 90 degrees    Other  Recommendations Oral Care Recommendations: Oral care BID    Recommendations for follow up therapy are one component of a multi-disciplinary  discharge planning process, led by the attending physician.  Recommendations may be updated based on patient status, additional functional criteria and insurance authorization.  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 Prior to that time, he had a 30 pack smoking history.  Pt also with h/o sleep apnea and he uses CPAP at home, COPD, pulmonary fibrosis, afib on Eliquis.  He is to have gall bladder removal soon per his statement.  Swallow eval was ordered due to concerns for aspiration. 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   He denies any issues with reflux nor choking when eating. Does admit to dyspnea.  Reports he takes "22 pills at one time".   Wife states pt eats slowly. Type of Study: Bedside Swallow Evaluation Previous Swallow Assessment: MBS 2021 WFL, esophagram normal, flash penetration 2021 Diet Prior to this Study: Full liquid diet Temperature Spikes Noted: No Respiratory Status: Nasal cannula History of Recent Intubation: No Behavior/Cognition: Alert;Cooperative;Pleasant mood Oral Cavity Assessment: Within Functional Limits Oral Care Completed by SLP: No Oral Cavity - Dentition: Adequate natural dentition Vision: Functional for self-feeding Self-Feeding Abilities: Able to feed self Patient Positioning: Upright in bed Baseline Vocal Quality:  Hoarse Volitional Cough: Strong Volitional Swallow: Able to elicit    Oral/Motor/Sensory Function Overall Oral Motor/Sensory Function: Within functional limits   Ice Chips Ice chips: Not tested   Thin Liquid Thin Liquid: Within functional limits Presentation: Cup;Self Fed    Nectar Thick Nectar Thick Liquid: Not tested   Honey Thick Honey Thick Liquid: Not tested   Puree Puree: Not tested   Solid     Solid: Not tested      Chales Abrahams 09/17/2022,6:23 PM  Rolena Infante, MS [facility] SLP Acute Rehab Services Office [phone]

## 2022-09-17 NOTE — Progress Notes (Signed)
Mobility Specialist - Progress Note   09/17/22 1338  Therapy Vitals  Temp (!) 97.5 F (36.4 C)  BP 113/62  Patient Position (if appropriate) Sitting  Oxygen Therapy  SpO2 (!) 88 %  O2 Device Nasal Cannula  O2 Flow Rate (L/min) 2 L/min  Patient Activity (if Appropriate) Ambulating  Mobility  Activity Ambulated with assistance in hallway;Ambulated with assistance to bathroom  Level of Assistance Contact guard assist, steadying assist  Assistive Device Front wheel walker  Distance Ambulated (ft) 40 ft  Activity Response Tolerated well  Mobility Referral Yes  $Mobility charge 1 Mobility  Mobility Specialist Start Time (ACUTE ONLY) 0113  Mobility Specialist Stop Time (ACUTE ONLY) 0136  Mobility Specialist Time Calculation (min) (ACUTE ONLY) 23 min   Pt received in recliner and agreeable to mobility. Once standing pt had some LOB that was self corrected. Pt c/o feeling dizzy but still eager to ambulate. Instructed pt to take a seated rest break prior to attempting ambulation again. Once in hallway pt c/o feeling dizzy again & shaky which prompted in ambulation cut short.   Upon returning to room, pt requested assistance to the bathroom for a BM. When returning to recliner, pt desat to 88%. Encouraged seated pursed lip breaths allowing O2 to come back up to 91%. No other complaints during session. Pt to recliner after session with all needs met & wife in room.   Pre-mobility:  92% SpO2 (2L [place]) During mobility: 88% SpO2 (2L [place]) Post-mobility: 91%  SPO2 (2L [place])  Chief Technology Officer

## 2022-09-17 NOTE — Progress Notes (Signed)
PROGRESS NOTE    Robert Lang  XBJ:[id number] DOB: 09-Jun-1950 DOA: 09/16/2022 PCP: [name], [name]   Brief Narrative: 72 year old with past medical history significant for pulmonary fibrosis, COPD, recent PE, recent cholecystitis, paroxysmal A-fib chronically on Eliquis, hyperlipidemia, allergic rhinitis who presents complaining of worsening shortness of breath, found to have aspiration pneumonia, severe sepsis, .  He reports 3 to 4 days of shortness of breath, nonproductive cough. Right upper quadrant pain.    Assessment & Plan:   Principal Problem:   Aspiration pneumonia of both lower lobes (HCC) Active Problems:   IPF (idiopathic pulmonary fibrosis) (HCC)   Hyperlipidemia   Depression   Paroxysmal atrial fibrillation (HCC)   Severe sepsis (HCC)   Acute respiratory failure with hypoxia (HCC)   History of COPD   History of pulmonary embolism   History of cholecystitis   GAD (generalized anxiety disorder)   Allergic rhinitis   BPH (benign prostatic hyperplasia)   1-Severe sepsis secondary to pneumonia: Patient presented with leukocytosis, tachycardia, tachypnea, lactic acidosis lactic acid at 2.  CT angio with atelectasis versus infiltrates. -continue with IV antibiotics.  -transition to Doxy in 1-2 days.   3-Acute Hypoxic Respiratory failure.  ILD COPD: Continue with oxygen supplementation.  Start Guaifenesin.  Incentive spirometry.  Pain management.  On elipta.  Continue with Pirfenidone.  Pulmonology consulted.  Started on Pulmicort  Recent diagnosis of acute PE: Review CTA form yesterday with radiology, new PE lingula could be artifact. Also prior PE, could have been artifact.  Will continue with Eliquis. Patient needs to be on NOAC for a fib.    Recent diagnosis of cholecystitis status post cholecystostomy tube Outpatient cholangiogram performed by IR 09/16/2022: Demonstrating appropriate positioning of the tube. Right Upper quadrant pain.   Complaints of worsening pain. No transaminase. Cystic duct patent.  Sx is not worry about worsening cholecystitis or infection.  Per IR follow up for out patient Cholangiogram.  Pain was probably worse when drain was not draining. Now is draining.     Paroxysmal A-fib: Continue with Eliquis.   Hyperlipidemia: On Lipitor.   Depression: Continue with Wellbutrin and BuSpar  Generalized anxiety disorder: Continue with BuSpar Allergic rhinitis: Continue with Claritin BPH: Continue With Proscar Hyponatremia; monitor.      Estimated body mass index is 27.59 kg/m as calculated from the following:   Height as of an earlier encounter on 09/16/22: 5\' 11"  (1.803 m).   Weight as of an earlier encounter on 09/16/22: 89.7 kg.   DVT prophylaxis: Eliquis Code Status: Full code Family Communication: Wife who was at bedside Disposition Plan:  Status is: Inpatient Remains inpatient appropriate because: management of PNA    Consultants:  Pulmonology  Sx  Procedures:    Antimicrobials:    Subjective: He is SOB with just talking.  He report cough. Report Right side upper quadrant pain, pain with deep breath.  He would like to eat some.    Objective: Vitals:   09/17/22 0000 09/17/22 0050 09/17/22 0451 09/17/22 0525  BP: 107/67 101/60 97/65 (!) 112/58  Pulse: 99 97 (!) 103 (!) 101  Resp: (!) 23 17 20 20   Temp:  97.9 F (36.6 C) 99 F (37.2 C) 98.8 F (37.1 C)  TempSrc:  Oral Oral Oral  SpO2: 93% 94% 94% 94%    Intake/Output Summary (Last 24 hours) at 09/17/2022 0756 Last data filed at 09/17/2022 0600 Gross per 24 hour  Intake 888.59 ml  Output 675 ml  Net 213.59 ml   There  were no vitals filed for this visit.  Examination:  General exam: Appears calm and comfortable  Respiratory system: BL air movement , no wheezing. Decreased breath sounds. Tachypnea.  Cardiovascular system: S1 & S2 heard, RRR. No JVD, murmurs, rubs, gallops or clicks. No pedal  edema. Gastrointestinal system: Abdomen is nondistended, soft and nontender. No organomegaly or masses felt. Normal bowel sounds heard. Central nervous system: Alert and oriented.  Extremities: Symmetric 5 x 5 power.   Data Reviewed: I have personally reviewed following labs and imaging studies  CBC: Recent Labs  Lab 09/13/22 1000 09/16/22 1825 09/17/22 0122  WBC 7.2 14.7* 11.5*  NEUTROABS 4.6  --  8.2*  HGB 14.3 15.8 13.5  HCT 43.8 48.1 42.7  MCV 94.6 94.1 97.7  PLT 219.0 200 165   Basic Metabolic Panel: Recent Labs  Lab 09/13/22 1000 09/16/22 1825 09/17/22 0122  NA 136 135 132*  K 4.5 4.4 3.9  CL 104 99 101  CO2 24 24 23   GLUCOSE 119* 118* 128*  BUN 11 12 12   CREATININE 0.87 1.05 0.94  CALCIUM 8.8 9.0 8.1*  MG  --   --  2.0  PHOS  --   --  3.6   GFR: Estimated Creatinine Clearance: 75.7 mL/min (by C-G formula based on SCr of 0.94 mg/dL). Liver Function Tests: Recent Labs  Lab 09/13/22 1000 09/16/22 1825 09/17/22 0122  AST 16 25 18   ALT 23 29 22   ALKPHOS 59 67 48  BILITOT 0.5 0.8 0.7  PROT 6.3 7.6 6.4*  ALBUMIN 3.5 3.7 2.9*   Recent Labs  Lab 09/13/22 1000 09/16/22 1825  LIPASE 133.0* 66*   No results for input(s): "AMMONIA" in the last 168 hours. Coagulation Profile: No results for input(s): "INR", "PROTIME" in the last 168 hours. Cardiac Enzymes: No results for input(s): "CKTOTAL", "CKMB", "CKMBINDEX", "TROPONINI" in the last 168 hours. BNP (last 3 results) No results for input(s): "PROBNP" in the last 8760 hours. HbA1C: No results for input(s): "HGBA1C" in the last 72 hours. CBG: No results for input(s): "GLUCAP" in the last 168 hours. Lipid Profile: No results for input(s): "CHOL", "HDL", "LDLCALC", "TRIG", "CHOLHDL", "LDLDIRECT" in the last 72 hours. Thyroid Function Tests: No results for input(s): "TSH", "T4TOTAL", "FREET4", "T3FREE", "THYROIDAB" in the last 72 hours. Anemia Panel: No results for input(s): "VITAMINB12", "FOLATE",  "FERRITIN", "TIBC", "IRON", "RETICCTPCT" in the last 72 hours. Sepsis Labs: Recent Labs  Lab 09/16/22 1933 09/17/22 0122  PROCALCITON  --  0.42  LATICACIDVEN 2.0* 2.3*    Recent Results (from the past 240 hour(s))  MRSA Next Gen by PCR, Nasal     Status: None   Collection Time: 09/17/22  1:22 AM   Specimen: Nasal Mucosa; Nasal Swab  Result Value Ref Range Status   MRSA by PCR Next Gen NOT DETECTED NOT DETECTED Final    Comment: (NOTE) The GeneXpert MRSA Assay (FDA approved for NASAL specimens only), is one component of a comprehensive MRSA colonization surveillance program. It is not intended to diagnose MRSA infection nor to guide or monitor treatment for MRSA infections. Test performance is not FDA approved in patients less than 59 years old. Performed at [facility], [address], [place], [place] [address]          Radiology Studies: CT Angio Chest PE W and/or Wo Contrast  Result Date: 09/16/2022 CLINICAL DATA:  Pulmonary embolism suspected. High probability. New onset hypoxia and shortness of breath. Also severe abdominal pain. Recently discharged after treatment  for small subsegmental arterial emboli in the left lower lobe. EXAM: CT ANGIOGRAPHY CHEST WITH CONTRAST TECHNIQUE: Multidetector CT imaging of the chest was performed using the standard protocol during bolus administration of intravenous contrast. Multiplanar CT image reconstructions and MIPs were obtained to evaluate the vascular anatomy. RADIATION DOSE REDUCTION: This exam was performed according to the departmental dose-optimization program which includes automated exposure control, adjustment of the mA and/or kV according to patient size and/or use of iterative reconstruction technique. CONTRAST:  OMNIPAQUE IOHEXOL 350 MG/ML SOLN COMPARISON:  Portable chest today, CTA chest 08/29/2022, CTA chest 08/25/2021 FINDINGS: Cardiovascular: Previously the patient had scattered subsegmental left  lower lobe arterial filling defects. These are no longer seen. There is a small-volume embolic filling defect within a single lingular subsegmental artery on series 2 axial 43 and 44 and also visible on series 5 images 63-65. No further emboli are detected. There is panchamber cardiomegaly with a slight right chamber predominance which was seen previously but no IVC reflux to suspect acute right heart strain. There is no pericardial effusion. There are heavy calcifications in the left main and LAD coronary arteries, additional calcifications and stenting in the right coronary artery and small amount of calcification in the proximal circumflex artery. Lipomatous hypertrophy of the inter-atrial septum is chronically noted as well. The pulmonary veins are nondistended. The aorta and great vessels are tortuous but normal caliber. No dissection is seen. There is moderate patchy aortic calcific plaque, less heavy scattered plaque in the great vessels. Mediastinum/Nodes: No enlarged mediastinal, hilar, or axillary lymph nodes. Thyroid gland, trachea, and esophagus demonstrate no significant findings. Lungs/Pleura: Bilateral trace pleural effusions are similar to the last CT. No pneumothorax. There are opacities in both posterior basal lower lobes, noted to varying degrees on both prior studies, bilaterally increased today and could indicate increased atelectasis or developing pneumonia. There is a chronically elevated right hemidiaphragm and mild posterior atelectasis in the upper lobes. The lungs are otherwise clear. No nodules are seen. The main bronchi are patent. There is chronic bronchial thickening in the lower lobes. Upper Abdomen: A percutaneous cholecystostomy drain is noted. The liver is steatotic and mildly enlarged. No other significant findings in the visualized upper abdomen. Musculoskeletal: Osteopenia and degenerative change of the spine. No acute osseous findings. No aggressive regional bone lesion. No  chest wall mass is seen. Review of the MIP images confirms the above findings. IMPRESSION: 1. Positive for a single small-volume subsegmental embolus in the lingula. No other emboli are seen. 2. Subsegmental emboli in the left lower lobe noted previously have cleared in the interval. 3. Cardiomegaly without evidence of acute right heart strain or venous dilatation. Chronic slight right chamber predominance. 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 4. Aortic and coronary artery atherosclerosis and lipomatous hypertrophy of the inter-atrial septum. 5. Trace pleural effusions with increased opacities in the posterior basal lower lobes which could be atelectasis or developing pneumonia. 6. Chronic lower lobe bronchial thickening. 7. Percutaneous cholecystostomy drain. 8. Hepatic steatosis and mild hepatomegaly. 9. Osteopenia and degenerative change. Aortic Atherosclerosis (ICD10-I70.0). Electronically Signed   By: Almira Bar M.D.   On: 09/16/2022 20:53   CT ABDOMEN PELVIS W CONTRAST  Result Date: 09/16/2022 CLINICAL DATA:  Right lower quadrant abdominal pain since yesterday, history of cholecystostomy tube EXAM: CT ABDOMEN AND PELVIS WITH CONTRAST TECHNIQUE: Multidetector CT imaging of the abdomen and pelvis was performed using the standard protocol following bolus administration of intravenous contrast. RADIATION DOSE REDUCTION: This exam was performed according to  the departmental dose-optimization program which includes automated exposure control, adjustment of the mA and/or kV according to patient size and/or use of iterative reconstruction technique. CONTRAST:  OMNIPAQUE IOHEXOL 350 MG/ML SOLN COMPARISON:  09/16/2022, 08/29/2022 FINDINGS: Lower chest: Bibasilar hypoventilatory changes. No acute pleural or parenchymal lung disease. Atherosclerosis of the aorta and coronary vasculature. Hepatobiliary: Stable hemangioma right lobe liver. Otherwise the liver is unremarkable. Percutaneous cholecystostomy  tube is seen coiled within the gallbladder lumen, with complete decompression of the gallbladder. There is mild pericholecystic fat stranding which may be related history of cholecystitis or indwelling drain. No biliary duct dilation. Pancreas: Unremarkable. No pancreatic ductal dilatation or surrounding inflammatory changes. Spleen: Normal in size without focal abnormality. Adrenals/Urinary Tract: Calcifications of the bilateral renal hila are likely vascular. No urinary tract calculi or obstructive uropathy. Kidneys enhance normally. The adrenals and bladder are unremarkable. Stomach/Bowel: No bowel obstruction or ileus. Diverticulosis of the descending and sigmoid colon without diverticulitis. Prior appendectomy. No bowel wall thickening. Vascular/Lymphatic: Aortic atherosclerosis. No enlarged abdominal or pelvic lymph nodes. Reproductive: Prostate is unremarkable. Other: No free fluid or free intraperitoneal gas. No abdominal wall hernia. Musculoskeletal: No acute or destructive bony abnormalities. Reconstructed images demonstrate no additional findings. IMPRESSION: 1. Indwelling cholecystostomy tube without evidence of complication. 2. Diverticulosis without diverticulitis. 3.  Aortic Atherosclerosis (ICD10-I70.0). Electronically Signed   By: Sharlet Salina M.D.   On: 09/16/2022 20:36   DG Abdomen 1 View  Result Date: 09/16/2022 CLINICAL DATA:  Abdominal pain and chills. EXAM: ABDOMEN - 1 VIEW COMPARISON:  None Available. FINDINGS: The bowel gas pattern is normal. A percutaneous biliary drainage catheter is seen overlying the right upper quadrant. No radio-opaque calculi or other significant radiographic abnormality are seen. IMPRESSION: Percutaneous biliary drainage catheter overlying the right upper quadrant. Electronically Signed   By: Aram Candela M.D.   On: 09/16/2022 19:30   DG Chest Portable 1 View  Result Date: 09/16/2022 CLINICAL DATA:  Abdominal pain and chills EXAM: PORTABLE CHEST 1 VIEW  COMPARISON:  August 28, 2022 FINDINGS: The heart size and mediastinal contours are within normal limits. There is moderate severity calcification of the aortic arch. Low lung volumes are noted with mild, diffuse, chronic appearing increased interstitial lung markings. Mild atelectasis is seen within the bilateral lung bases. There is no evidence of a pleural effusion or pneumothorax. A biliary drainage catheter is seen overlying the lateral aspect of the right upper quadrant. Multilevel degenerative changes are seen throughout the thoracic spine. IMPRESSION: Low lung volumes with mild bibasilar atelectasis. Electronically Signed   By: Aram Candela M.D.   On: 09/16/2022 19:29   IR CHOLANGIOGRAM EXISTING TUBE  Result Date: 09/16/2022 INDICATION: History of acute cholecystitis, post ultrasound cholecystostomy tube placement on 08/30/2022. EXAM: CHOLECYSTOSTOMY TUBE EVALUATION and ANTEGRADE CHOLANGIOGRAM COMPARISON:  IR fluoroscopy, 08/30/2022.  CTA chest, 08/29/2022. MEDICATIONS: None ANESTHESIA/SEDATION: None CONTRAST:  5mL OMNIPAQUE IOHEXOL 300 MG/ML SOLN - administered into the gallbladder fossa. FLUOROSCOPY TIME:  Fluoroscopic dose; 4 mGy COMPLICATIONS: None immediate. PROCEDURE: The patient was positioned supine on the fluoroscopy table. A preprocedural spot fluoroscopic image was obtained of the right upper abdominal quadrant existing cholecystostomy tube. Multiple spot fluoroscopic radiographic images were obtained of the RIGHT upper abdominal quadrant an existing cholecystostomy tube following injection of a small amount of contrast. Images were reviewed and discussed with the patient. The cholecystostomy tube was flushed with a small amount of saline. The previous stitch was removed, and a new 2-0 Ethilon stitch was placed. A dressing was  placed. The patient tolerated the procedure well without immediate postprocedural complication. Procedure assisted by, Loran Senters IR RT FINDINGS: Preprocedural spot  fluoroscopic image of the right upper abdominal quadrant demonstrates grossly unchanged positioning of the cholecystostomy tube with end coiled and locked overlying the expected location of the gallbladder fundus. Subsequent contrast injection demonstrates appropriate functionality of the cholecystostomy tube with brisk opacification of the gallbladder. There is passage of contrast from the gallbladder through the cystic and common bile ducts the level duodenum. No biliary ductal filling defect to suggest the presence of choledocholithiasis. IMPRESSION: 1. Appropriately positioned and functioning cholecystostomy tube. No exchange performed. 2. Patent cystic and common bile ducts. No fluoroscopic evidence of choledocholithiasis. PLAN: The patient will return to [facility] ([facility]) for routine drainage catheter evaluation and exchange in 6 weeks (from catheter placement). Roanna Banning, MD [facility] Electronically Signed   By: Roanna Banning M.D.   On: 09/16/2022 14:15        Scheduled Meds:  apixaban  5 mg Oral BID   atorvastatin  80 mg Oral Daily   buPROPion  300 mg Oral Daily   busPIRone  15 mg Oral TID   famotidine  40 mg Oral QHS   finasteride  5 mg Oral Daily   ipratropium  0.5 mg Nebulization Q6H   lamoTRIgine  100 mg Oral Daily   levalbuterol  1.25 mg Nebulization Q6H   loratadine  10 mg Oral Daily   pantoprazole  40 mg Oral Daily   Pirfenidone  801 mg Oral TID   pregabalin  150 mg Oral BID   ranolazine  1,000 mg Oral BID   Continuous Infusions:  sodium chloride 75 mL/hr at 09/17/22 0259   ceFEPime (MAXIPIME) IV 2 g (09/17/22 0529)   metronidazole 500 mg (09/17/22 0301)   vancomycin       LOS: 0 days    Time spent: 35 minutes    Jackalynn Art A Raegan Sipp, MD [facility]   If 7PM-7AM, please contact night-coverage [email or web address]  09/17/2022, 7:56 AM

## 2022-09-17 NOTE — Progress Notes (Addendum)
Subjective: CC: Known to our service.   Patient with hx of pulmonary fibrosis, CAD, HTN, HLD, COPD, OSA, PAF, recent dx pulmonary embolism (6/9) on Eliquis (last dose 6/28 am) who we saw during admission from 6/8 - 6/12 for Cholecystitis s/p Perc Chole drain on 6/10.  Patient reports that he was doing well at home until Wednesday night.  His wife noticed that his Perc Chole drain no longer was draining.  Before this it was draining ~150cc bilious output per day.  He began having RUQ abdominal pain that was constant and associate with nausea.  No fever, chills, vomiting, constipation or diarrhea.  He reports he also has began having shortness of breath along with a productive cough with green sputum.  He was seen by IR regarding his drain which look like it was good position.  His drain then began draining bilious output.  He had resolution of his pain at rest but he continues to have RUQ pain when coughing or with movement only.  He is currently on FLD.  No further nausea or vomiting.  Last BM yesterday and normal.  Did have some dysuria today which is new.  No other urinary symptoms.  Currently afebrile without hypotension.  Mild tachycardia at 103.  WBC 11.5.  LFTs within normal limits.  UA negative.  Blood cultures NGTD.  Respiratory culture pending.  CTA chest w/ PE, cardiomegaly, trace pleural effusions and lower lobe opacities.  He is currently being treated for pneumonia.  He had IR cholangiogram that showed appropriately positioned and functioning cholecystostomy tube with patent cystic and common bile ducts.  This did not show any fluoroscopic evidence of choledocholithiasis.  He also had a CT scan of the A/P that showed cholecystostomy tube without evidence of complication and no other acute issue.  Last colonoscopy was in 2023 that showed polyps in the ascending and sigmoid colon that were non-malignant on path; diverticuli in the sigmoid colon and internal hemorrhoids. Denies chronic  nsaid use. He has been sober from alcohol for 2 years. Prior tobacco use, none currently. Last EGD 2023 that showed gastritis.  H. pylori was negative  History of prior inguinal hernia repair, laparoscopic appendectomy and ventral hernia repair  Objective: Vital signs in last 24 hours: Temp:  [97.9 F (36.6 C)-99 F (37.2 C)] 97.9 F (36.6 C) (06/28 1123) Pulse Rate:  [97-122] 103 (06/28 1123) Resp:  [17-29] 22 (06/28 0914) BP: (97-126)/(58-79) 112/75 (06/28 1123) SpO2:  [92 %-98 %] 98 % (06/28 1123) Last BM Date : 09/16/22  Intake/Output from previous day: 06/27 0701 - 06/28 0700 In: 888.6 [P.O.:30; I.V.:2.5; IV Piggyback:856.1] Out: 675 [Urine:350; Drains:325] Intake/Output this shift: Total I/O In: -  Out: 150 [Urine:100; Drains:50]  PE: Gen:  Alert, NAD, pleasant Card:  Reg Pulm:  Rate and  effort normal on o2 Abd: Obese, not obviously distended, very soft, R sided abdominal ttp without rigidity or guarding. +BS. No obvious hernia. Perc chole drain in place with bilious output in gravity bag. No obvious drainage or cellulitis around the drain.  Psych: A&Ox3   Lab Results:  Recent Labs    09/16/22 1825 09/17/22 0122  WBC 14.7* 11.5*  HGB 15.8 13.5  HCT 48.1 42.7  PLT 200 165   BMET Recent Labs    09/16/22 1825 09/17/22 0122  NA 135 132*  K 4.4 3.9  CL 99 101  CO2 24 23  GLUCOSE 118* 128*  BUN 12 12  CREATININE 1.05 0.94  CALCIUM 9.0 8.1*   PT/INR No results for input(s): "LABPROT", "INR" in the last 72 hours. CMP     Component Value Date/Time   NA 132 (L) 09/17/2022 0122   NA 137 03/05/2020 1343   K 3.9 09/17/2022 0122   CL 101 09/17/2022 0122   CO2 23 09/17/2022 0122   GLUCOSE 128 (H) 09/17/2022 0122   BUN 12 09/17/2022 0122   BUN 14 03/05/2020 1343   CREATININE 0.94 09/17/2022 0122   CREATININE 0.92 09/02/2021 1541   CALCIUM 8.1 (L) 09/17/2022 0122   PROT 6.4 (L) 09/17/2022 0122   PROT 6.9 04/20/2019 0807   ALBUMIN 2.9 (L) 09/17/2022 0122    ALBUMIN 4.3 04/20/2019 0807   AST 18 09/17/2022 0122   ALT 22 09/17/2022 0122   ALKPHOS 48 09/17/2022 0122   BILITOT 0.7 09/17/2022 0122   BILITOT 0.5 04/20/2019 0807   GFRNONAA >60 09/17/2022 0122   GFRAA 81 03/05/2020 1343   Lipase     Component Value Date/Time   LIPASE 66 (H) 09/16/2022 1825    Studies/Results: CT Angio Chest PE W and/or Wo Contrast  Addendum Date: 09/17/2022   ADDENDUM REPORT: 09/17/2022 11:11 ADDENDUM: I was asked to review case at 9:10 am on 09/17/2022 with Dr. Sunnie Lang. Reported pulmonary embolus of the lingula likely due to streak or motion artifact. Additionally, previously performed chest CTA dated August 29, 2022 also significantly limited due to motion artifact and previously reported pulmonary embolus is not clearly appreciated, at the very least no embolus in any segmental or more proximal vessel. Lower extremity ultrasound or repeat chest CTA could be performed for further evaluation. Electronically Signed   By: Robert Lang M.D.   On: 09/17/2022 11:11   Result Date: 09/17/2022 CLINICAL DATA:  Pulmonary embolism suspected. High probability. New onset hypoxia and shortness of breath. Also severe abdominal pain. Recently discharged after treatment for small subsegmental arterial emboli in the left lower lobe. EXAM: CT ANGIOGRAPHY CHEST WITH CONTRAST TECHNIQUE: Multidetector CT imaging of the chest was performed using the standard protocol during bolus administration of intravenous contrast. Multiplanar CT image reconstructions and MIPs were obtained to evaluate the vascular anatomy. RADIATION DOSE REDUCTION: This exam was performed according to the departmental dose-optimization program which includes automated exposure control, adjustment of the mA and/or kV according to patient size and/or use of iterative reconstruction technique. CONTRAST:  OMNIPAQUE IOHEXOL 350 MG/ML SOLN COMPARISON:  Portable chest today, CTA chest 08/29/2022, CTA chest 08/25/2021  FINDINGS: Cardiovascular: Previously the patient had scattered subsegmental left lower lobe arterial filling defects. These are no longer seen. There is a small-volume embolic filling defect within a single lingular subsegmental artery on series 2 axial 43 and 44 and also visible on series 5 images 63-65. No further emboli are detected. There is panchamber cardiomegaly with a slight right chamber predominance which was seen previously but no IVC reflux to suspect acute right heart strain. There is no pericardial effusion. There are heavy calcifications in the left main and LAD coronary arteries, additional calcifications and stenting in the right coronary artery and small amount of calcification in the proximal circumflex artery. Lipomatous hypertrophy of the inter-atrial septum is chronically noted as well. The pulmonary veins are nondistended. The aorta and great vessels are tortuous but normal caliber. No dissection is seen. There is moderate patchy aortic calcific plaque, less heavy scattered plaque in the great vessels. Mediastinum/Nodes: No enlarged mediastinal, hilar, or axillary lymph nodes. Thyroid gland, trachea, and esophagus demonstrate no significant findings.  Lungs/Pleura: Bilateral trace pleural effusions are similar to the last CT. No pneumothorax. There are opacities in both posterior basal lower lobes, noted to varying degrees on both prior studies, bilaterally increased today and could indicate increased atelectasis or developing pneumonia. There is a chronically elevated right hemidiaphragm and mild posterior atelectasis in the upper lobes. The lungs are otherwise clear. No nodules are seen. The main bronchi are patent. There is chronic bronchial thickening in the lower lobes. Upper Abdomen: A percutaneous cholecystostomy drain is noted. The liver is steatotic and mildly enlarged. No other significant findings in the visualized upper abdomen. Musculoskeletal: Osteopenia and degenerative change of  the spine. No acute osseous findings. No aggressive regional bone lesion. No chest wall mass is seen. Review of the MIP images confirms the above findings. IMPRESSION: 1. Positive for a single small-volume subsegmental embolus in the lingula. No other emboli are seen. 2. Subsegmental emboli in the left lower lobe noted previously have cleared in the interval. 3. Cardiomegaly without evidence of acute right heart strain or venous dilatation. Chronic slight right chamber predominance. 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 4. Aortic and coronary artery atherosclerosis and lipomatous hypertrophy of the inter-atrial septum. 5. Trace pleural effusions with increased opacities in the posterior basal lower lobes which could be atelectasis or developing pneumonia. 6. Chronic lower lobe bronchial thickening. 7. Percutaneous cholecystostomy drain. 8. Hepatic steatosis and mild hepatomegaly. 9. Osteopenia and degenerative change. Aortic Atherosclerosis (ICD10-I70.0). Electronically Signed: By: Almira Bar M.D. On: 09/16/2022 20:53   VAS [place] LOWER EXTREMITY VENOUS (DVT)  Result Date: 09/17/2022  Lower Venous DVT Study Patient Name:  Robert Lang  Date of Exam:   09/17/2022 Medical Rec #: [id number]          Accession #:    [id number] Date of Birth: February 06, 1951           Patient Gender: M Patient Age:   72 years Exam Location:  [facility] Procedure:      VAS [place] LOWER EXTREMITY VENOUS (DVT) Referring Phys: Jon Billings REGALADO --------------------------------------------------------------------------------  Indications: Edema.  Risk Factors: None identified. Comparison Study: 09/01/2022 - BILATERAL:                   - No evidence of deep vein thrombosis seen in the lower                   extremities, bilaterally.                   -No evidence of popliteal cyst, bilaterally. Performing Technologist: Chanda Busing RVT  Examination Guidelines: A complete evaluation includes B-mode imaging, spectral Doppler, color  Doppler, and power Doppler as needed of all accessible portions of each vessel. Bilateral testing is considered an integral part of a complete examination. Limited examinations for reoccurring indications may be performed as noted. The reflux portion of the exam is performed with the patient in reverse Trendelenburg.  +---------+---------------+---------+-----------+----------+--------------+ RIGHT    CompressibilityPhasicitySpontaneityPropertiesThrombus Aging +---------+---------------+---------+-----------+----------+--------------+ CFV      Full           Yes      Yes                                 +---------+---------------+---------+-----------+----------+--------------+ SFJ      Full                                                        +---------+---------------+---------+-----------+----------+--------------+  FV Prox  Full                                                        +---------+---------------+---------+-----------+----------+--------------+ FV Mid   Full                                                        +---------+---------------+---------+-----------+----------+--------------+ FV DistalFull                                                        +---------+---------------+---------+-----------+----------+--------------+ PFV      Full                                                        +---------+---------------+---------+-----------+----------+--------------+ POP      Full           Yes      Yes                                 +---------+---------------+---------+-----------+----------+--------------+ PTV      Full                                                        +---------+---------------+---------+-----------+----------+--------------+ PERO     Full                                                        +---------+---------------+---------+-----------+----------+--------------+    +---------+---------------+---------+-----------+----------+--------------+ LEFT     CompressibilityPhasicitySpontaneityPropertiesThrombus Aging +---------+---------------+---------+-----------+----------+--------------+ CFV      Full           Yes      Yes                                 +---------+---------------+---------+-----------+----------+--------------+ SFJ      Full                                                        +---------+---------------+---------+-----------+----------+--------------+ FV Prox  Full                                                        +---------+---------------+---------+-----------+----------+--------------+  FV Mid   Full                                                        +---------+---------------+---------+-----------+----------+--------------+ FV DistalFull                                                        +---------+---------------+---------+-----------+----------+--------------+ PFV      Full                                                        +---------+---------------+---------+-----------+----------+--------------+ POP      Full           Yes      Yes                                 +---------+---------------+---------+-----------+----------+--------------+ PTV      Full                                                        +---------+---------------+---------+-----------+----------+--------------+ PERO     Full                                                        +---------+---------------+---------+-----------+----------+--------------+     Summary: RIGHT: - There is no evidence of deep vein thrombosis in the lower extremity.  - No cystic structure found in the popliteal fossa.  LEFT: - There is no evidence of deep vein thrombosis in the lower extremity.  - No cystic structure found in the popliteal fossa.  *See table(s) above for measurements and observations.    Preliminary    CT  ABDOMEN PELVIS W CONTRAST  Result Date: 09/16/2022 CLINICAL DATA:  Right lower quadrant abdominal pain since yesterday, history of cholecystostomy tube EXAM: CT ABDOMEN AND PELVIS WITH CONTRAST TECHNIQUE: Multidetector CT imaging of the abdomen and pelvis was performed using the standard protocol following bolus administration of intravenous contrast. RADIATION DOSE REDUCTION: This exam was performed according to the departmental dose-optimization program which includes automated exposure control, adjustment of the mA and/or kV according to patient size and/or use of iterative reconstruction technique. CONTRAST:  OMNIPAQUE IOHEXOL 350 MG/ML SOLN COMPARISON:  09/16/2022, 08/29/2022 FINDINGS: Lower chest: Bibasilar hypoventilatory changes. No acute pleural or parenchymal lung disease. Atherosclerosis of the aorta and coronary vasculature. Hepatobiliary: Stable hemangioma right lobe liver. Otherwise the liver is unremarkable. Percutaneous cholecystostomy tube is seen coiled within the gallbladder lumen, with complete decompression of the gallbladder. There is mild pericholecystic fat stranding which may be related history of cholecystitis or indwelling drain. No biliary duct dilation. Pancreas: Unremarkable.  No pancreatic ductal dilatation or surrounding inflammatory changes. Spleen: Normal in size without focal abnormality. Adrenals/Urinary Tract: Calcifications of the bilateral renal hila are likely vascular. No urinary tract calculi or obstructive uropathy. Kidneys enhance normally. The adrenals and bladder are unremarkable. Stomach/Bowel: No bowel obstruction or ileus. Diverticulosis of the descending and sigmoid colon without diverticulitis. Prior appendectomy. No bowel wall thickening. Vascular/Lymphatic: Aortic atherosclerosis. No enlarged abdominal or pelvic lymph nodes. Reproductive: Prostate is unremarkable. Other: No free fluid or free intraperitoneal gas. No abdominal wall hernia. Musculoskeletal:  No acute or destructive bony abnormalities. Reconstructed images demonstrate no additional findings. IMPRESSION: 1. Indwelling cholecystostomy tube without evidence of complication. 2. Diverticulosis without diverticulitis. 3.  Aortic Atherosclerosis (ICD10-I70.0). Electronically Signed   By: Sharlet Salina M.D.   On: 09/16/2022 20:36   DG Abdomen 1 View  Result Date: 09/16/2022 CLINICAL DATA:  Abdominal pain and chills. EXAM: ABDOMEN - 1 VIEW COMPARISON:  None Available. FINDINGS: The bowel gas pattern is normal. A percutaneous biliary drainage catheter is seen overlying the right upper quadrant. No radio-opaque calculi or other significant radiographic abnormality are seen. IMPRESSION: Percutaneous biliary drainage catheter overlying the right upper quadrant. Electronically Signed   By: Aram Candela M.D.   On: 09/16/2022 19:30   DG Chest Portable 1 View  Result Date: 09/16/2022 CLINICAL DATA:  Abdominal pain and chills EXAM: PORTABLE CHEST 1 VIEW COMPARISON:  August 28, 2022 FINDINGS: The heart size and mediastinal contours are within normal limits. There is moderate severity calcification of the aortic arch. Low lung volumes are noted with mild, diffuse, chronic appearing increased interstitial lung markings. Mild atelectasis is seen within the bilateral lung bases. There is no evidence of a pleural effusion or pneumothorax. A biliary drainage catheter is seen overlying the lateral aspect of the right upper quadrant. Multilevel degenerative changes are seen throughout the thoracic spine. IMPRESSION: Low lung volumes with mild bibasilar atelectasis. Electronically Signed   By: Aram Candela M.D.   On: 09/16/2022 19:29   IR CHOLANGIOGRAM EXISTING TUBE  Result Date: 09/16/2022 INDICATION: History of acute cholecystitis, post ultrasound cholecystostomy tube placement on 08/30/2022. EXAM: CHOLECYSTOSTOMY TUBE EVALUATION and ANTEGRADE CHOLANGIOGRAM COMPARISON:  IR fluoroscopy, 08/30/2022.  CTA chest,  08/29/2022. MEDICATIONS: None ANESTHESIA/SEDATION: None CONTRAST:  5mL OMNIPAQUE IOHEXOL 300 MG/ML SOLN - administered into the gallbladder fossa. FLUOROSCOPY TIME:  Fluoroscopic dose; 4 mGy COMPLICATIONS: None immediate. PROCEDURE: The patient was positioned supine on the fluoroscopy table. A preprocedural spot fluoroscopic image was obtained of the right upper abdominal quadrant existing cholecystostomy tube. Multiple spot fluoroscopic radiographic images were obtained of the RIGHT upper abdominal quadrant an existing cholecystostomy tube following injection of a small amount of contrast. Images were reviewed and discussed with the patient. The cholecystostomy tube was flushed with a small amount of saline. The previous stitch was removed, and a new 2-0 Ethilon stitch was placed. A dressing was placed. The patient tolerated the procedure well without immediate postprocedural complication. Procedure assisted by, Loran Senters IR RT FINDINGS: Preprocedural spot fluoroscopic image of the right upper abdominal quadrant demonstrates grossly unchanged positioning of the cholecystostomy tube with end coiled and locked overlying the expected location of the gallbladder fundus. Subsequent contrast injection demonstrates appropriate functionality of the cholecystostomy tube with brisk opacification of the gallbladder. There is passage of contrast from the gallbladder through the cystic and common bile ducts the level duodenum. No biliary ductal filling defect to suggest the presence of choledocholithiasis. IMPRESSION: 1. Appropriately positioned and functioning cholecystostomy tube. No exchange performed.  2. Patent cystic and common bile ducts. No fluoroscopic evidence of choledocholithiasis. PLAN: The patient will return to [facility] (VIR) for routine drainage catheter evaluation and exchange in 6 weeks (from catheter placement). Roanna Banning, MD [facility]  [facility] Radiology Electronically Signed   By: Roanna Banning M.D.   On: 09/16/2022 14:15    Anti-infectives: Anti-infectives (From admission, onward)    Start     Dose/Rate Route Frequency Ordered Stop   09/17/22 1100  vancomycin (VANCOCIN) IVPB 1000 mg/200 mL premix        1,000 mg 200 mL/hr over 60 Minutes Intravenous Every 12 hours 09/16/22 2139     09/17/22 0245  metroNIDAZOLE (FLAGYL) IVPB 500 mg        500 mg 100 mL/hr over 60 Minutes Intravenous Every 12 hours 09/17/22 0156     09/16/22 2300  vancomycin (VANCOCIN) 500 mg in sodium chloride 0.9 % 100 mL IVPB       See Hyperspace for full Linked Orders Report.   500 mg 100 mL/hr over 60 Minutes Intravenous  Once 09/16/22 2139 09/17/22 0103   09/16/22 2145  ceFEPIme (MAXIPIME) 2 g in sodium chloride 0.9 % 100 mL IVPB        2 g 200 mL/hr over 30 Minutes Intravenous Every 8 hours 09/16/22 2139     09/16/22 2145  vancomycin (VANCOCIN) IVPB 1000 mg/200 mL premix       See Hyperspace for full Linked Orders Report.   1,000 mg 200 mL/hr over 60 Minutes Intravenous  Once 09/16/22 2139 09/16/22 2345        Assessment/Plan RUQ abdominal pain  Hx of Perc Chole 6/8 for Acute Cholecystitis (cx's NGTD) IR Cholangiogram yesterday showed appropriately positioned and functioning cholecystostomy tube with patent cystic and common bile ducts and no fluoroscopic evidence of choledocholithiasis.  His Perc chole appears to be draining properly on exam with bilious output in gravity bag. LFT's are wnl. CT also obtained and looks reassuring with indwelling Cholecystostomy tube without complication and otherwise without any acute process. His Lipase was elevated at 66 on admission. Would repeat to ensure this is not uptrending. There was no evidence of pancreatitis on imaging. Otherwise it is unclear the etiology of his pain. It could be referred from PNA, CHF, his drain (IR consult pending) or another etiology but regardless, I do not think he needs any  emergency surgery right now.  We will follow prn moving forward. If there are any changes, questions or concerns please call us back sooner. Discussed with TRH in person. He already has a f/u appointment scheduled in the office. Could benefit from getting cardiology and pulmonary risk stratification before this f/u appointment (can be done as an outpatient).   I reviewed nursing notes, ED provider notes, Consultant (IR) notes, hospitalist notes, last 24 h vitals and pain scores, last 48 h intake and output, last 24 h labs and trends, and last 24 h imaging results.   LOS: 0 days    Jacinto Halim , [facility] 09/17/2022, 11:25 AM Please see Amion for pager number during day hours 7:00am-4:30pm

## 2022-09-17 NOTE — Progress Notes (Signed)
Bilateral lower extremity venous duplex has been completed. Preliminary results can be found in CV Proc through chart review.   09/17/22 10:16 AM Olen Cordial RVT

## 2022-09-18 ENCOUNTER — Telehealth: Payer: Self-pay | Admitting: Internal Medicine

## 2022-09-18 DIAGNOSIS — R0602 Shortness of breath: Secondary | ICD-10-CM

## 2022-09-18 DIAGNOSIS — J9601 Acute respiratory failure with hypoxia: Secondary | ICD-10-CM | POA: Diagnosis not present

## 2022-09-18 LAB — CULTURE, RESPIRATORY W GRAM STAIN

## 2022-09-18 LAB — BASIC METABOLIC PANEL
Anion gap: 6 (ref 5–15)
BUN: 10 mg/dL (ref 8–23)
CO2: 21 mmol/L — ABNORMAL LOW (ref 22–32)
Calcium: 8.2 mg/dL — ABNORMAL LOW (ref 8.9–10.3)
Chloride: 106 mmol/L (ref 98–111)
Creatinine, Ser: 0.73 mg/dL (ref 0.61–1.24)
GFR, Estimated: >60 mL/min (ref >60)
Glucose, Bld: 119 mg/dL — ABNORMAL HIGH (ref 70–99)
Potassium: 4.1 mmol/L (ref 3.5–5.1)
Sodium: 133 mmol/L — ABNORMAL LOW (ref 135–145)

## 2022-09-18 LAB — CBC
HCT: 40.7 % (ref 39.0–52.0)
Hemoglobin: 12.8 g/dL — ABNORMAL LOW (ref 13.0–17.0)
MCH: 30.7 pg (ref 26.0–34.0)
MCHC: 31.4 g/dL (ref 30.0–36.0)
MCV: 97.6 fL (ref 80.0–100.0)
Platelets: 150 10*3/uL (ref 150–400)
RBC: 4.17 MIL/uL — ABNORMAL LOW (ref 4.22–5.81)
RDW: 14.5 % (ref 11.5–15.5)
WBC: 7.2 10*3/uL (ref 4.0–10.5)
nRBC: 0 % (ref 0.0–0.2)

## 2022-09-18 LAB — CULTURE, BLOOD (ROUTINE X 2)

## 2022-09-18 MED ORDER — AMOXICILLIN-POT CLAVULANATE 875-125 MG PO TABS
1.0000 | ORAL_TABLET | Freq: Two times a day (BID) | ORAL | Status: DC
  Administered 2022-09-18: 1 via ORAL
  Filled 2022-09-18: qty 1

## 2022-09-18 MED ORDER — AMOXICILLIN-POT CLAVULANATE 875-125 MG PO TABS: 1.0000 | ORAL_TABLET | Freq: Two times a day (BID) | ORAL | 0 refills | Status: AC

## 2022-09-18 MED ORDER — APIXABAN 5 MG PO TABS: 5.0000 mg | ORAL_TABLET | Freq: Two times a day (BID) | ORAL | 0 refills | Status: DC

## 2022-09-18 MED ORDER — LEVALBUTEROL HCL 1.25 MG/0.5ML IN NEBU: 1.2500 mg | INHALATION_SOLUTION | RESPIRATORY_TRACT | 12 refills | Status: AC | PRN

## 2022-09-18 MED ORDER — GUAIFENESIN ER 600 MG PO TB12: 1200.0000 mg | ORAL_TABLET | Freq: Two times a day (BID) | ORAL | 0 refills | Status: DC

## 2022-09-18 NOTE — Progress Notes (Signed)
PT Cancellation Note  Patient Details Name: Robert Lang MRN: [id number] DOB: [birth year]   Cancelled Treatment:    Reason Eval/Treat Not Completed: (P) Other (comment): pt pleasantly declined PT eval this date as pt reports he is at baseline other than hypoxia and has HHPT PTA and will be calling to resume, pt highly anticipating discharge in the next few minutes. Reviewed DME and pt reports no needs; educated pt on the importance of utilizing rollator seat for rest breaks when walking and to make use of shower seat during bathing, pt verbalized understanding. PT is signing off, should needs change please reconsult.  Jamesetta Geralds, PT, DPT [facility] Rehabilitation Department Office: [phone]  Jamesetta Geralds 09/18/2022, 4:27 PM

## 2022-09-18 NOTE — Progress Notes (Addendum)
Mobility Specialist - Progress Note   09/18/22 1331  Oxygen Therapy  SpO2 (!) 89 %  O2 Device Room Air  Patient Activity (if Appropriate) Ambulating  Mobility  Activity Ambulated with assistance in hallway  Level of Assistance Standby assist, set-up cues, supervision of patient - no hands on  Assistive Device Front wheel walker  Distance Ambulated (ft) 250 ft  Activity Response Tolerated well  Mobility Referral Yes  $Mobility charge 1 Mobility  Mobility Specialist Start Time (ACUTE ONLY) 0115  Mobility Specialist Stop Time (ACUTE ONLY) 0131  Mobility Specialist Time Calculation (min) (ACUTE ONLY) 16 min   Nurse requested Mobility Specialist to perform oxygen saturation test with pt which includes removing pt from oxygen both at rest and while ambulating.  Below are the results from that testing.     Patient Saturations on Room Air at Rest = spO2 94%  Patient Saturations on Room Air while Ambulating = sp02 89% .  Rested and performed pursed lip breathing for 1 minute with sp02 at 96%.  At end of testing pt left in room on 0  Liters of oxygen.  Reported results to nurse.  Pt received in bed and agreeable to mobility. During ambulation, pt desat to 89% on RA after ambulating ~145ft. Encouraged a standing rest break with pursed lip breaths allowing O2 to come up to 96%. When returning to room, pt desat to 86% on RA once seated. Encouraged pursed lip breaths allowing O2 to come back up to 94% in <76min. No complaints during session. Pt to bed after session with all needs met.   [facility]

## 2022-09-18 NOTE — Discharge Summary (Signed)
Physician Discharge Summary   Patient: Robert Lang MRN: [id number] DOB: [birth year]  Admit date:     09/16/2022  Discharge date: 09/18/22  Discharge Physician: Alba Cory   PCP: [name], [name]   Recommendations at discharge:    Needs close follow up with Pulmonologist.  Needs to follow up with Surgery for further evaluation for cholecystectomy.  Resume HH PT/OT  Discharge Diagnoses: Principal Problem:   Aspiration pneumonia of both lower lobes (HCC) Active Problems:   IPF (idiopathic pulmonary fibrosis) (HCC)   Hyperlipidemia   Depression   Paroxysmal atrial fibrillation (HCC)   Severe sepsis (HCC)   Acute respiratory failure with hypoxia (HCC)   History of COPD   History of pulmonary embolism   History of cholecystitis   GAD (generalized anxiety disorder)   Allergic rhinitis   BPH (benign prostatic hyperplasia)  Resolved Problems:   * No resolved hospital problems. *  Hospital Course: 72 year old with past medical history significant for pulmonary fibrosis, COPD, recent PE, recent cholecystitis, paroxysmal A-fib chronically on Eliquis, hyperlipidemia, allergic rhinitis who presents complaining of worsening shortness of breath, found to have aspiration pneumonia, severe sepsis, .  He reports 3 to 4 days of shortness of breath, nonproductive cough. Right upper quadrant pain.   Patient was treated for pneumonia with cefepime.  Sputum culture growing gram-positive cocci plan to discharge on Augmentin to complete 7 days.  His right upper quadrant abdominal pain has improved from he was felt to be related to transient decrease drainage from colostomy.  Now is functioning.  Assessment and Plan: 1-Severe sepsis secondary to pneumonia: Patient presented with leukocytosis, tachycardia, tachypnea, lactic acidosis lactic acid at 2.  CT angio with atelectasis versus infiltrates. -Treated with IV antibiotics.  Plan to transition to Augmentin. Sputum growing gram  positive cocci.  He will complete 7 days of antibiotics at discharge.  Vitals stable. WBC normalized.    3-Acute Hypoxic Respiratory failure.  ILD COPD: Continue with oxygen supplementation.  Started Guaifenesin.  Incentive spirometry.  Pain management.  On elipta.  Continue with Pirfenidone.  Pulmonology consulted.  Plan to check Oxygen on ambulation, he will likely qualified.  Stable. Breathing improved.   Recent diagnosis of acute PE: Review CTA form yesterday with radiology, new PE lingula could be artifact. Also prior PE, could have been artifact.  Will continue with Eliquis. Patient needs to be on NOAC for a fib.      Recent diagnosis of cholecystitis status post cholecystostomy tube Outpatient cholangiogram performed by IR 09/16/2022: Demonstrating appropriate positioning of the tube. Right Upper quadrant pain.  Complaints of worsening pain. No transaminase. Cystic duct patent.  Sx is not worry about worsening cholecystitis or infection.  Per IR follow up for out patient Cholangiogram.  Pain was probably worse when drain was not draining. Now is draining.   Abdominal pain improved.    Paroxysmal A-fib: Continue with Eliquis.    Hyperlipidemia: On Lipitor.    Depression: Continue with Wellbutrin and BuSpar   Generalized anxiety disorder: Continue with BuSpar.  Allergic rhinitis: Continue with Claritin.  BPH: Continue With Proscar Hyponatremia; monitor.      Estimated body mass index is 27.59 kg/m as calculated from the following:   Height as of an earlier encounter on 09/16/22: 5\' 11"  (1.803 m).   Weight as of an earlier encounter on 09/16/22: 89.7 kg.          Consultants: Pulmonology. Sx Procedures performed:none Disposition: Home Diet recommendation:  Cardiac diet DISCHARGE MEDICATION: Allergies as  of 09/18/2022       Reactions   Naproxen Other (See Comments)   (Naprosyn *ANALGESICS - ANTI-INFLAMMATORY*) Nausea, Abdominal pain   Silodosin  Other (See Comments)   Low blood pressure        Medication List     TAKE these medications    amoxicillin-clavulanate 875-125 MG tablet Commonly known as: AUGMENTIN Take 1 tablet by mouth every 12 (twelve) hours for 6 days.   Anoro Ellipta 62.5-25 MCG/ACT Aepb Generic drug: umeclidinium-vilanterol Inhale 1 puff into the lungs daily.   apixaban 5 MG Tabs tablet Commonly known as: ELIQUIS Take 1 tablet (5 mg total) by mouth 2 (two) times daily. What changed: Another medication with the same name was removed. Continue taking this medication, and follow the directions you see here.   atorvastatin 80 MG tablet Commonly known as: LIPITOR Take 1 tablet (80 mg total) by mouth daily.   BD PosiFlush 0.9 % Soln injection Generic drug: sodium chloride flush Flush drain with 5 mLs daily. Discard remaining 5ml in each syringe.   buPROPion 300 MG 24 hr tablet Commonly known as: WELLBUTRIN XL Take 1 tablet (300 mg total) by mouth daily.   busPIRone 15 MG tablet Commonly known as: BUSPAR Take 1 tablet (15 mg total) by mouth 3 (three) times daily.   cyanocobalamin 1000 MCG tablet Commonly known as: VITAMIN B12 Take 1,000 mcg by mouth daily.   docusate sodium 100 MG capsule Commonly known as: COLACE Take 100 mg by mouth daily.   famotidine 40 MG tablet Commonly known as: PEPCID TAKE 1 TABLET(40 MG) BY MOUTH AT BEDTIME What changed: See the new instructions.   fexofenadine 180 MG tablet Commonly known as: ALLEGRA Take 180 mg by mouth at bedtime.   finasteride 5 MG tablet Commonly known as: PROSCAR Take 1 tablet (5 mg total) by mouth daily.   guaiFENesin 600 MG 12 hr tablet Commonly known as: MUCINEX Take 2 tablets (1,200 mg total) by mouth 2 (two) times daily.   lamoTRIgine 100 MG tablet Commonly known as: LaMICtal Take 1 tablet (100 mg total) by mouth daily. What changed: Another medication with the same name was removed. Continue taking this medication, and follow  the directions you see here.   levalbuterol 1.25 MG/0.5ML nebulizer solution Commonly known as: XOPENEX Take 1.25 mg by nebulization every 4 (four) hours as needed for wheezing or shortness of breath.   LORazepam 0.5 MG tablet Commonly known as: Ativan Take one tablet by mouth only for severe anxiety or agitation What changed:  how much to take how to take this when to take this reasons to take this additional instructions   Melatonin 10 MG Tabs Take 1 tablet by mouth at bedtime.   MIRALAX PO Take 17 g by mouth daily.   nitroGLYCERIN 0.4 MG SL tablet Commonly known as: NITROSTAT Place 1 tablet (0.4 mg total) under the tongue every 5 (five) minutes as needed for chest pain.   ondansetron 4 MG tablet Commonly known as: Zofran Take 1 tablet (4 mg total) by mouth every 8 (eight) hours as needed for nausea or vomiting.   pantoprazole 40 MG tablet Commonly known as: PROTONIX TAKE 1 TABLET(40 MG) BY MOUTH DAILY What changed: See the new instructions.   Pirfenidone 801 MG Tabs Commonly known as: Esbriet Take 1 tablet (801 mg total) by mouth 3 (three) times daily.   pregabalin 150 MG capsule Commonly known as: LYRICA Take 1 capsule (150 mg total) by mouth 2 (two) times daily.  PRESERVISION AREDS PO Take 1 capsule by mouth in the morning and at bedtime.   ranolazine 1000 MG SR tablet Commonly known as: RANEXA Take 1 tablet (1,000 mg total) by mouth 2 (two) times daily.   sertraline 100 MG tablet Commonly known as: ZOLOFT Take 1.5 tablets (150 mg total) by mouth at bedtime.   traZODone 50 MG tablet Commonly known as: DESYREL Take 1 tablet (50 mg total) by mouth at bedtime as needed for sleep (May take 1/2 tab to 1 tablet for sleep, as needed.).   Vitamin D3 25 MCG (1000 UT) Caps Take 2 capsules by mouth 2 (two) times daily.   zolpidem 5 MG tablet Commonly known as: AMBIEN Take 1 tablet (5 mg total) by mouth at bedtime as needed for sleep.                Durable Medical Equipment  (From admission, onward)           Start     Ordered   09/18/22 1049  For home use only DME Nebulizer machine  Once       Question Answer Comment  Patient needs a nebulizer to treat with the following condition COPD (chronic obstructive pulmonary disease) (HCC)   Length of Need 6 Months      09/18/22 1048   09/18/22 1044  For home use only DME oxygen  Once       Question Answer Comment  Length of Need 6 Months   Mode or (Route) Nasal cannula   Liters per Minute 3   Frequency Continuous (stationary and portable oxygen unit needed)   Oxygen delivery system Gas      09/18/22 1044            Discharge Exam: Filed Weights   09/18/22 0616  Weight: 90.3 kg   General NAD  Condition at discharge: stable  The results of significant diagnostics from this hospitalization (including imaging, microbiology, ancillary and laboratory) are listed below for reference.   Imaging Studies: VAS [place] LOWER EXTREMITY VENOUS (DVT)  Result Date: 09/17/2022  Lower Venous DVT Study Patient Name:  TOMOTHY OLIVOS  Date of Exam:   09/17/2022 Medical Rec #: [id number]          Accession #:    [id number] Date of Birth: Apr 21, 1950           Patient Gender: M Patient Age:   1 years Exam Location:  [facility] Procedure:      VAS [place] LOWER EXTREMITY VENOUS (DVT) Referring Phys: Jon Billings Autumn Pruitt --------------------------------------------------------------------------------  Indications: Edema.  Risk Factors: None identified. Comparison Study: 09/01/2022 - BILATERAL:                   - No evidence of deep vein thrombosis seen in the lower                   extremities, bilaterally.                   -No evidence of popliteal cyst, bilaterally. Performing Technologist: Chanda Busing RVT  Examination Guidelines: A complete evaluation includes B-mode imaging, spectral Doppler, color Doppler, and power Doppler as needed of all accessible portions of each vessel. Bilateral  testing is considered an integral part of a complete examination. Limited examinations for reoccurring indications may be performed as noted. The reflux portion of the exam is performed with the patient in reverse Trendelenburg.  +---------+---------------+---------+-----------+----------+--------------+ RIGHT    CompressibilityPhasicitySpontaneityPropertiesThrombus Aging +---------+---------------+---------+-----------+----------+--------------+ CFV  Full           Yes      Yes                                 +---------+---------------+---------+-----------+----------+--------------+ SFJ      Full                                                        +---------+---------------+---------+-----------+----------+--------------+ FV Prox  Full                                                        +---------+---------------+---------+-----------+----------+--------------+ FV Mid   Full                                                        +---------+---------------+---------+-----------+----------+--------------+ FV DistalFull                                                        +---------+---------------+---------+-----------+----------+--------------+ PFV      Full                                                        +---------+---------------+---------+-----------+----------+--------------+ POP      Full           Yes      Yes                                 +---------+---------------+---------+-----------+----------+--------------+ PTV      Full                                                        +---------+---------------+---------+-----------+----------+--------------+ PERO     Full                                                        +---------+---------------+---------+-----------+----------+--------------+   +---------+---------------+---------+-----------+----------+--------------+ LEFT      CompressibilityPhasicitySpontaneityPropertiesThrombus Aging +---------+---------------+---------+-----------+----------+--------------+ CFV      Full           Yes      Yes                                 +---------+---------------+---------+-----------+----------+--------------+  SFJ      Full                                                        +---------+---------------+---------+-----------+----------+--------------+ FV Prox  Full                                                        +---------+---------------+---------+-----------+----------+--------------+ FV Mid   Full                                                        +---------+---------------+---------+-----------+----------+--------------+ FV DistalFull                                                        +---------+---------------+---------+-----------+----------+--------------+ PFV      Full                                                        +---------+---------------+---------+-----------+----------+--------------+ POP      Full           Yes      Yes                                 +---------+---------------+---------+-----------+----------+--------------+ PTV      Full                                                        +---------+---------------+---------+-----------+----------+--------------+ PERO     Full                                                        +---------+---------------+---------+-----------+----------+--------------+     Summary: RIGHT: - There is no evidence of deep vein thrombosis in the lower extremity.  - No cystic structure found in the popliteal fossa.  LEFT: - There is no evidence of deep vein thrombosis in the lower extremity.  - No cystic structure found in the popliteal fossa.  *See table(s) above for measurements and observations. Electronically signed by Coral Else MD on 09/17/2022 at 10:47:26 PM.    Final    CT Angio Chest PE W  and/or Wo Contrast  Addendum Date: 09/17/2022   ADDENDUM REPORT: 09/17/2022 11:11 ADDENDUM: I was asked to review case at 9:10 am on 09/17/2022 with Dr.  Torii Royse. Reported pulmonary embolus of the lingula likely due to streak or motion artifact. Additionally, previously performed chest CTA dated August 29, 2022 also significantly limited due to motion artifact and previously reported pulmonary embolus is not clearly appreciated, at the very least no embolus in any segmental or more proximal vessel. Lower extremity ultrasound or repeat chest CTA could be performed for further evaluation. Electronically Signed   By: Allegra Lai M.D.   On: 09/17/2022 11:11   Result Date: 09/17/2022 CLINICAL DATA:  Pulmonary embolism suspected. High probability. New onset hypoxia and shortness of breath. Also severe abdominal pain. Recently discharged after treatment for small subsegmental arterial emboli in the left lower lobe. EXAM: CT ANGIOGRAPHY CHEST WITH CONTRAST TECHNIQUE: Multidetector CT imaging of the chest was performed using the standard protocol during bolus administration of intravenous contrast. Multiplanar CT image reconstructions and MIPs were obtained to evaluate the vascular anatomy. RADIATION DOSE REDUCTION: This exam was performed according to the departmental dose-optimization program which includes automated exposure control, adjustment of the mA and/or kV according to patient size and/or use of iterative reconstruction technique. CONTRAST:  OMNIPAQUE IOHEXOL 350 MG/ML SOLN COMPARISON:  Portable chest today, CTA chest 08/29/2022, CTA chest 08/25/2021 FINDINGS: Cardiovascular: Previously the patient had scattered subsegmental left lower lobe arterial filling defects. These are no longer seen. There is a small-volume embolic filling defect within a single lingular subsegmental artery on series 2 axial 43 and 44 and also visible on series 5 images 63-65. No further emboli are detected. There is panchamber  cardiomegaly with a slight right chamber predominance which was seen previously but no IVC reflux to suspect acute right heart strain. There is no pericardial effusion. There are heavy calcifications in the left main and LAD coronary arteries, additional calcifications and stenting in the right coronary artery and small amount of calcification in the proximal circumflex artery. Lipomatous hypertrophy of the inter-atrial septum is chronically noted as well. The pulmonary veins are nondistended. The aorta and great vessels are tortuous but normal caliber. No dissection is seen. There is moderate patchy aortic calcific plaque, less heavy scattered plaque in the great vessels. Mediastinum/Nodes: No enlarged mediastinal, hilar, or axillary lymph nodes. Thyroid gland, trachea, and esophagus demonstrate no significant findings. Lungs/Pleura: Bilateral trace pleural effusions are similar to the last CT. No pneumothorax. There are opacities in both posterior basal lower lobes, noted to varying degrees on both prior studies, bilaterally increased today and could indicate increased atelectasis or developing pneumonia. There is a chronically elevated right hemidiaphragm and mild posterior atelectasis in the upper lobes. The lungs are otherwise clear. No nodules are seen. The main bronchi are patent. There is chronic bronchial thickening in the lower lobes. Upper Abdomen: A percutaneous cholecystostomy drain is noted. The liver is steatotic and mildly enlarged. No other significant findings in the visualized upper abdomen. Musculoskeletal: Osteopenia and degenerative change of the spine. No acute osseous findings. No aggressive regional bone lesion. No chest wall mass is seen. Review of the MIP images confirms the above findings. IMPRESSION: 1. Positive for a single small-volume subsegmental embolus in the lingula. No other emboli are seen. 2. Subsegmental emboli in the left lower lobe noted previously have cleared in the  interval. 3. Cardiomegaly without evidence of acute right heart strain or venous dilatation. Chronic slight right chamber predominance. 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 4. Aortic and coronary artery atherosclerosis and lipomatous hypertrophy of the inter-atrial septum. 5. Trace pleural effusions with increased opacities in the posterior basal lower lobes  which could be atelectasis or developing pneumonia. 6. Chronic lower lobe bronchial thickening. 7. Percutaneous cholecystostomy drain. 8. Hepatic steatosis and mild hepatomegaly. 9. Osteopenia and degenerative change. Aortic Atherosclerosis (ICD10-I70.0). Electronically Signed: By: Almira Bar M.D. On: 09/16/2022 20:53   CT ABDOMEN PELVIS W CONTRAST  Result Date: 09/16/2022 CLINICAL DATA:  Right lower quadrant abdominal pain since yesterday, history of cholecystostomy tube EXAM: CT ABDOMEN AND PELVIS WITH CONTRAST TECHNIQUE: Multidetector CT imaging of the abdomen and pelvis was performed using the standard protocol following bolus administration of intravenous contrast. RADIATION DOSE REDUCTION: This exam was performed according to the departmental dose-optimization program which includes automated exposure control, adjustment of the mA and/or kV according to patient size and/or use of iterative reconstruction technique. CONTRAST:  OMNIPAQUE IOHEXOL 350 MG/ML SOLN COMPARISON:  09/16/2022, 08/29/2022 FINDINGS: Lower chest: Bibasilar hypoventilatory changes. No acute pleural or parenchymal lung disease. Atherosclerosis of the aorta and coronary vasculature. Hepatobiliary: Stable hemangioma right lobe liver. Otherwise the liver is unremarkable. Percutaneous cholecystostomy tube is seen coiled within the gallbladder lumen, with complete decompression of the gallbladder. There is mild pericholecystic fat stranding which may be related history of cholecystitis or indwelling drain. No biliary duct dilation. Pancreas: Unremarkable. No pancreatic ductal  dilatation or surrounding inflammatory changes. Spleen: Normal in size without focal abnormality. Adrenals/Urinary Tract: Calcifications of the bilateral renal hila are likely vascular. No urinary tract calculi or obstructive uropathy. Kidneys enhance normally. The adrenals and bladder are unremarkable. Stomach/Bowel: No bowel obstruction or ileus. Diverticulosis of the descending and sigmoid colon without diverticulitis. Prior appendectomy. No bowel wall thickening. Vascular/Lymphatic: Aortic atherosclerosis. No enlarged abdominal or pelvic lymph nodes. Reproductive: Prostate is unremarkable. Other: No free fluid or free intraperitoneal gas. No abdominal wall hernia. Musculoskeletal: No acute or destructive bony abnormalities. Reconstructed images demonstrate no additional findings. IMPRESSION: 1. Indwelling cholecystostomy tube without evidence of complication. 2. Diverticulosis without diverticulitis. 3.  Aortic Atherosclerosis (ICD10-I70.0). Electronically Signed   By: Sharlet Salina M.D.   On: 09/16/2022 20:36   DG Abdomen 1 View  Result Date: 09/16/2022 CLINICAL DATA:  Abdominal pain and chills. EXAM: ABDOMEN - 1 VIEW COMPARISON:  None Available. FINDINGS: The bowel gas pattern is normal. A percutaneous biliary drainage catheter is seen overlying the right upper quadrant. No radio-opaque calculi or other significant radiographic abnormality are seen. IMPRESSION: Percutaneous biliary drainage catheter overlying the right upper quadrant. Electronically Signed   By: Aram Candela M.D.   On: 09/16/2022 19:30   DG Chest Portable 1 View  Result Date: 09/16/2022 CLINICAL DATA:  Abdominal pain and chills EXAM: PORTABLE CHEST 1 VIEW COMPARISON:  August 28, 2022 FINDINGS: The heart size and mediastinal contours are within normal limits. There is moderate severity calcification of the aortic arch. Low lung volumes are noted with mild, diffuse, chronic appearing increased interstitial lung markings. Mild  atelectasis is seen within the bilateral lung bases. There is no evidence of a pleural effusion or pneumothorax. A biliary drainage catheter is seen overlying the lateral aspect of the right upper quadrant. Multilevel degenerative changes are seen throughout the thoracic spine. IMPRESSION: Low lung volumes with mild bibasilar atelectasis. Electronically Signed   By: Aram Candela M.D.   On: 09/16/2022 19:29   IR CHOLANGIOGRAM EXISTING TUBE  Result Date: 09/16/2022 INDICATION: History of acute cholecystitis, post ultrasound cholecystostomy tube placement on 08/30/2022. EXAM: CHOLECYSTOSTOMY TUBE EVALUATION and ANTEGRADE CHOLANGIOGRAM COMPARISON:  IR fluoroscopy, 08/30/2022.  CTA chest, 08/29/2022. MEDICATIONS: None ANESTHESIA/SEDATION: None CONTRAST:  5mL OMNIPAQUE IOHEXOL 300 MG/ML  SOLN - administered into the gallbladder fossa. FLUOROSCOPY TIME:  Fluoroscopic dose; 4 mGy COMPLICATIONS: None immediate. PROCEDURE: The patient was positioned supine on the fluoroscopy table. A preprocedural spot fluoroscopic image was obtained of the right upper abdominal quadrant existing cholecystostomy tube. Multiple spot fluoroscopic radiographic images were obtained of the RIGHT upper abdominal quadrant an existing cholecystostomy tube following injection of a small amount of contrast. Images were reviewed and discussed with the patient. The cholecystostomy tube was flushed with a small amount of saline. The previous stitch was removed, and a new 2-0 Ethilon stitch was placed. A dressing was placed. The patient tolerated the procedure well without immediate postprocedural complication. Procedure assisted by, Loran Senters IR RT FINDINGS: Preprocedural spot fluoroscopic image of the right upper abdominal quadrant demonstrates grossly unchanged positioning of the cholecystostomy tube with end coiled and locked overlying the expected location of the gallbladder fundus. Subsequent contrast injection demonstrates appropriate  functionality of the cholecystostomy tube with brisk opacification of the gallbladder. There is passage of contrast from the gallbladder through the cystic and common bile ducts the level duodenum. No biliary ductal filling defect to suggest the presence of choledocholithiasis. IMPRESSION: 1. Appropriately positioned and functioning cholecystostomy tube. No exchange performed. 2. Patent cystic and common bile ducts. No fluoroscopic evidence of choledocholithiasis. PLAN: The patient will return to [facility] ([facility]) for routine drainage catheter evaluation and exchange in 6 weeks (from catheter placement). Roanna Banning, MD [facility] Electronically Signed   By: Roanna Banning M.D.   On: 09/16/2022 14:15   VAS [place] LOWER EXTREMITY VENOUS (DVT)  Result Date: 09/01/2022  Lower Venous DVT Study Patient Name:  DIXIE SCHNIPKE  Date of Exam:   09/01/2022 Medical Rec #: [id number]          Accession #:    [id number] Date of Birth: Apr 08, 1950           Patient Gender: M Patient Age:   56 years Exam Location:  [facility] Procedure:      VAS [place] LOWER EXTREMITY VENOUS (DVT) Referring Phys: Pia Mau --------------------------------------------------------------------------------  Indications: Pulmonary embolism. Other Indications: COPD, IPF. Anticoagulation: Xarelto. Comparison Study: No priors Performing Technologist: Evergreen Sink Sturdivant RDMS, RVT  Examination Guidelines: A complete evaluation includes B-mode imaging, spectral Doppler, color Doppler, and power Doppler as needed of all accessible portions of each vessel. Bilateral testing is considered an integral part of a complete examination. Limited examinations for reoccurring indications may be performed as noted. The reflux portion of the exam is performed with the patient in reverse Trendelenburg.  +---------+---------------+---------+-----------+----------+--------------+ RIGHT     CompressibilityPhasicitySpontaneityPropertiesThrombus Aging +---------+---------------+---------+-----------+----------+--------------+ CFV      Full           Yes      Yes                                 +---------+---------------+---------+-----------+----------+--------------+ SFJ      Full                                                        +---------+---------------+---------+-----------+----------+--------------+ FV Prox  Full                                                        +---------+---------------+---------+-----------+----------+--------------+  FV Mid   Full                                                        +---------+---------------+---------+-----------+----------+--------------+ FV DistalFull                                                        +---------+---------------+---------+-----------+----------+--------------+ PFV      Full                                                        +---------+---------------+---------+-----------+----------+--------------+ POP      Full           Yes      Yes                                 +---------+---------------+---------+-----------+----------+--------------+ PTV      Full                                                        +---------+---------------+---------+-----------+----------+--------------+ PERO     Full                                                        +---------+---------------+---------+-----------+----------+--------------+   +---------+---------------+---------+-----------+----------+--------------+ LEFT     CompressibilityPhasicitySpontaneityPropertiesThrombus Aging +---------+---------------+---------+-----------+----------+--------------+ CFV      Full           Yes      Yes                                 +---------+---------------+---------+-----------+----------+--------------+ SFJ      Full                                                         +---------+---------------+---------+-----------+----------+--------------+ FV Prox  Full                                                        +---------+---------------+---------+-----------+----------+--------------+ FV Mid   Full                                                        +---------+---------------+---------+-----------+----------+--------------+  FV DistalFull                                                        +---------+---------------+---------+-----------+----------+--------------+ PFV      Full                                                        +---------+---------------+---------+-----------+----------+--------------+ POP      Full           Yes      Yes                                 +---------+---------------+---------+-----------+----------+--------------+ PTV      Full                                                        +---------+---------------+---------+-----------+----------+--------------+ PERO     Full                                                        +---------+---------------+---------+-----------+----------+--------------+     Summary: BILATERAL: - No evidence of deep vein thrombosis seen in the lower extremities, bilaterally. -No evidence of popliteal cyst, bilaterally.   *See table(s) above for measurements and observations. Electronically signed by Heath Lark on 09/01/2022 at 4:56:44 PM.    Final    IR Perc Cholecystostomy  Result Date: 08/31/2022 INDICATION: 72 year old male referred for percutaneous cholecystostomy EXAM: CHOLECYSTOSTOMY MEDICATIONS: None ANESTHESIA/SEDATION: Moderate (conscious) sedation was employed during this procedure. A total of Versed 1.0 mg and Fentanyl 75 mcg was administered intravenously. Moderate Sedation Time: 17 minutes. The patient's level of consciousness and vital signs were monitored continuously by radiology nursing throughout the procedure under my  direct supervision. FLUOROSCOPY TIME:  Fluoroscopy Time: 3 mGy). COMPLICATIONS: None PROCEDURE: Informed written consent was obtained from the patient and the patient's family after a thorough discussion of the procedural risks, benefits and alternatives. All questions were addressed. Maximal Sterile Barrier Technique was utilized including caps, mask, sterile gowns, sterile gloves, sterile drape, hand hygiene and skin antiseptic. A timeout was performed prior to the initiation of the procedure. Ultrasound survey of the right upper quadrant was performed for planning purposes. Once the patient is prepped and draped in the usual sterile fashion, the skin and subcutaneous tissues overlying the gallbladder were generously infiltrated 1% lidocaine for local anesthesia. A coaxial needle was advanced under ultrasound guidance through the skin subcutaneous tissues and a small segment of liver into the gallbladder lumen. With removal of the stylet, spontaneous dark bile drainage occurred. Using modified Seldinger technique, a 10 French drain was placed into the gallbladder fossa, with aspiration of the sample for the lab. Contrast injection confirmed position of the tube within the gallbladder lumen. Drainage catheter was attached to  gravity drain with a suture retention placed. Patient tolerated the procedure well and remained hemodynamically stable throughout. No complications were encountered and no significant blood loss encountered. IMPRESSION: Status post image guided percutaneous cholecystostomy Signed, Yvone Neu. Miachel Roux, RPVI [facility] Electronically Signed   By: Gilmer Mor D.O.   On: 08/31/2022 08:28   ECHOCARDIOGRAM COMPLETE  Result Date: 08/29/2022    ECHOCARDIOGRAM REPORT   Patient Name:   TYWAN WAWRO Date of Exam: 08/29/2022 Medical Rec #:  [id number]         Height:       71.0 in Accession #:    [id number]        Weight:       196.8  lb Date of Birth:  02-04-1951          BSA:          2.094 m Patient Age:    72 years          BP:           163/87 mmHg Patient Gender: M                 HR:           97 bpm. Exam Location:  Inpatient Procedure: 2D Echo, Cardiac Doppler and Color Doppler Indications:    Pulmonary Embolus I26.09  History:        Patient has prior history of Echocardiogram examinations, most                 recent 05/19/2022. CAD and Previous Myocardial Infarction, COPD                 and ETOH abuse; Risk Factors:Sleep Apnea, Dyslipidemia, Former                 Smoker and Hypertension.  Sonographer:    Dondra Prader RVT RCS Referring Phys: [id number] TIMOTHY S OPYD  Sonographer Comments: Suboptimal apical window, Technically challenging study due to limited acoustic windows and Technically difficult study due to poor echo windows. Declined Definity IMPRESSIONS  1. Left ventricular ejection fraction, by estimation, is 60 to 65%. The left ventricle has normal function. Left ventricular endocardial border not optimally defined to evaluate regional wall motion. Left ventricular diastolic parameters are indeterminate.  2. Right ventricular systolic function is normal. The right ventricular size is normal. Tricuspid regurgitation signal is inadequate for assessing PA pressure.  3. The mitral valve is normal in structure. No evidence of mitral valve regurgitation. No evidence of mitral stenosis.  4. The aortic valve is tricuspid. There is mild calcification of the aortic valve. There is mild thickening of the aortic valve. Aortic valve regurgitation is not visualized. No aortic stenosis is present.  5. The inferior vena cava is normal in size with greater than 50% respiratory variability, suggesting right atrial pressure of 3 mmHg. FINDINGS  Left Ventricle: Left ventricular ejection fraction, by estimation, is 60 to 65%. The left ventricle has normal function. Left ventricular endocardial border not optimally defined to evaluate regional wall  motion. The left ventricular internal cavity size was normal in size. There is no left ventricular hypertrophy. Left ventricular diastolic parameters are indeterminate. Right Ventricle: The right ventricular size is normal. Right vetricular wall thickness was not well visualized. Right ventricular systolic function is normal. Tricuspid regurgitation signal is inadequate for assessing PA pressure. Left Atrium: Left atrial size was normal in size. Right Atrium: Right atrial size was  normal in size. Pericardium: There is no evidence of pericardial effusion. Mitral Valve: The mitral valve is normal in structure. No evidence of mitral valve regurgitation. No evidence of mitral valve stenosis. Tricuspid Valve: The tricuspid valve is normal in structure. Tricuspid valve regurgitation is not demonstrated. No evidence of tricuspid stenosis. Aortic Valve: The aortic valve is tricuspid. There is mild calcification of the aortic valve. There is mild thickening of the aortic valve. There is mild aortic valve annular calcification. Aortic valve regurgitation is not visualized. No aortic stenosis  is present. Aortic valve mean gradient measures 3.0 mmHg. Aortic valve peak gradient measures 6.0 mmHg. Aortic valve area, by VTI measures 2.84 cm. Pulmonic Valve: The pulmonic valve was not well visualized. Pulmonic valve regurgitation is not visualized. No evidence of pulmonic stenosis. Aorta: The aortic root is normal in size and structure. Venous: The inferior vena cava is normal in size with greater than 50% respiratory variability, suggesting right atrial pressure of 3 mmHg. IAS/Shunts: No atrial level shunt detected by color flow Doppler.  LEFT VENTRICLE PLAX 2D LVIDd:         5.00 cm   Diastology LVIDs:         3.50 cm   LV e' medial:    6.96 cm/s LV PW:         1.00 cm   LV E/e' medial:  14.2 LV IVS:        1.00 cm   LV e' lateral:   10.40 cm/s LVOT diam:     2.00 cm   LV E/e' lateral: 9.5 LV SV:         58 LV SV Index:   28  LVOT Area:     3.14 cm  RIGHT VENTRICLE             IVC RV Basal diam:  3.50 cm     IVC diam: 1.60 cm RV S prime:     15.20 cm/s TAPSE (M-mode): 2.0 cm LEFT ATRIUM             Index        RIGHT ATRIUM           Index LA diam:        4.30 cm 2.05 cm/m   RA Area:     11.90 cm LA Vol (A2C):   56.3 ml 26.88 ml/m  RA Volume:   24.50 ml  11.70 ml/m LA Vol (A4C):   40.0 ml 19.10 ml/m LA Biplane Vol: 46.2 ml 22.06 ml/m  AORTIC VALVE                    PULMONIC VALVE AV Area (Vmax):    2.65 cm     PV Vmax:       1.50 m/s AV Area (Vmean):   2.72 cm     PV Peak grad:  9.0 mmHg AV Area (VTI):     2.84 cm AV Vmax:           122.00 cm/s AV Vmean:          78.400 cm/s AV VTI:            0.206 m AV Peak Grad:      6.0 mmHg AV Mean Grad:      3.0 mmHg LVOT Vmax:         103.00 cm/s LVOT Vmean:        67.900 cm/s LVOT VTI:  0.186 m LVOT/AV VTI ratio: 0.90  AORTA Ao Root diam: 3.30 cm Ao Asc diam:  3.00 cm MITRAL VALVE MV Area (PHT): 5.13 cm    SHUNTS MV Decel Time: 148 msec    Systemic VTI:  0.19 m MV E velocity: 98.50 cm/s  Systemic Diam: 2.00 cm Dina Rich MD Electronically signed by Dina Rich MD Signature Date/Time: 08/29/2022/4:14:46 PM    Final    US Abdomen Limited RUQ (LIVER/GB)  Result Date: 08/29/2022 CLINICAL DATA:  Cholelithiasis EXAM: ULTRASOUND ABDOMEN LIMITED RIGHT UPPER QUADRANT COMPARISON:  CT from earlier in the same day, ultrasound from 05/13/2021. FINDINGS: Gallbladder: Gallbladder is well distended. Positive sonographic Murphy's sign is noted. Wall thickening to 4.5 mm is seen. The dependent density seen on prior CT is not well appreciated on this exam. Common bile duct: Diameter: 3 mm Liver: 1.5 cm echogenic focus is noted in the right lobe of the liver consistent with hemangioma. This is stable from prior ultrasound. No other focal abnormality is noted portal vein is patent on color Doppler imaging with normal direction of blood flow towards the liver. Other: None. IMPRESSION:  Stable hepatic hemangioma. Gallbladder wall thickening with positive sonographic Murphy's sign. These changes are consistent with cholecystitis in the appropriate clinical setting. The dependent density seen on prior CT is not well appreciated on this exam. Electronically Signed   By: Alcide Clever M.D.   On: 08/29/2022 10:15   CT Angio Chest Pulmonary Embolism (PE) W or WO Contrast  Result Date: 08/29/2022 CLINICAL DATA:  Pulmonary embolism suspected, high probability. EXAM: CT ANGIOGRAPHY CHEST WITH CONTRAST TECHNIQUE: Multidetector CT imaging of the chest was performed using the standard protocol during bolus administration of intravenous contrast. Multiplanar CT image reconstructions and MIPs were obtained to evaluate the vascular anatomy. RADIATION DOSE REDUCTION: This exam was performed according to the departmental dose-optimization program which includes automated exposure control, adjustment of the mA and/or kV according to patient size and/or use of iterative reconstruction technique. CONTRAST:  75mL OMNIPAQUE IOHEXOL 350 MG/ML SOLN COMPARISON:  08/25/2021. FINDINGS: Cardiovascular: The heart is enlarged and there is a trace pericardial effusion. Three-vessel coronary artery calcifications are noted. There is atherosclerotic calcification of the aorta without evidence of aneurysm. The pulmonary trunk is normal in caliber. Small pulmonary artery filling defects are noted in the subsegmental arteries in the left lower lobe. Evaluation is slightly limited due to respiratory motion. Dilatation of the right ventricle is noted. Mediastinum/Nodes: No enlarged mediastinal, hilar, or axillary lymph nodes. Thyroid gland, trachea, and esophagus demonstrate no significant findings. Lungs/Pleura: There is a trace right pleural effusion with atelectasis at the lung bases. No pneumothorax. Upper Abdomen: Hyperdense material is present in the dependent portion of the urinary bladder, possible stones or sludge. There is  gallbladder wall thickening with pericholecystic fat stranding. Musculoskeletal: Degenerative changes are present in the thoracic spine. Kyphoplasty changes are present at T12. No acute osseous abnormality. Review of the MIP images confirms the above findings. IMPRESSION: 1. Small subsegmental pulmonary emboli in the left lower lobe. There is dilatation of the right ventricle which is unchanged from the prior exam and may be related to right heart failure given small thrombus volume. Correlate clinically to exclude superimposed strain. 2. Trace right pleural effusion with atelectasis at the lung bases bilaterally. 3. Layering hyperdense material in the gallbladder, possible stones or sludge, with gallbladder wall thickening and surrounding fat stranding, concerning for cholecystitis. Ultrasound is recommended for further evaluation. 4. Three-vessel coronary artery calcifications. 5. Aortic atherosclerosis.  Critical Value/emergent results were called by telephone at the time of interpretation on 08/29/2022 at 3:44 am to provider TIMOTHY OPYD , who verbally acknowledged these results. Electronically Signed   By: Thornell Sartorius M.D.   On: 08/29/2022 03:44   DG Chest 2 View  Result Date: 08/28/2022 CLINICAL DATA:  Dyspnea EXAM: CHEST - 2 VIEW COMPARISON:  09/02/2021 chest radiograph. FINDINGS: Stable cardiomediastinal silhouette with normal heart size. No pneumothorax. No pleural effusion. No pulmonary edema. Mild streaky bibasilar scarring versus atelectasis. No acute consolidative airspace disease. IMPRESSION: Mild streaky bibasilar scarring versus atelectasis. Otherwise no active cardiopulmonary disease. Electronically Signed   By: Delbert Phenix M.D.   On: 08/28/2022 16:59    Microbiology: Results for orders placed or performed during the hospital encounter of 09/16/22  Blood culture (routine x 2)     Status: None (Preliminary result)   Collection Time: 09/16/22  7:30 PM   Specimen: BLOOD  Result Value Ref  Range Status   Specimen Description   Final    BLOOD LEFT ANTECUBITAL Performed at [facility], [address] [place], [place] [address]    Special Requests   Final    BOTTLES DRAWN AEROBIC AND ANAEROBIC Blood Culture adequate volume Performed at [facility], [address] [place], [place] [address]    Culture   Final    NO GROWTH 2 DAYS Performed at [facility], [address], [place], [place] [address]    Report Status PENDING  Incomplete  Blood culture (routine x 2)     Status: None (Preliminary result)   Collection Time: 09/16/22  9:00 PM   Specimen: BLOOD  Result Value Ref Range Status   Specimen Description   Final    BLOOD BLOOD LEFT FOREARM Performed at [facility], [address] [place], [place] [address]    Special Requests   Final    BOTTLES DRAWN AEROBIC AND ANAEROBIC Blood Culture adequate volume Performed at [facility], [address] [place], [place] [address]    Culture   Final    NO GROWTH 2 DAYS Performed at [facility], [address], [place], [place] [address]    Report Status PENDING  Incomplete  MRSA Next Gen by PCR, Nasal     Status: None   Collection Time: 09/17/22  1:22 AM   Specimen: Nasal Mucosa; Nasal Swab  Result Value Ref Range Status   MRSA by PCR Next Gen NOT DETECTED NOT DETECTED Final    Comment: (NOTE) The GeneXpert MRSA Assay (FDA approved for NASAL specimens only), is one component of a comprehensive MRSA colonization surveillance program. It is not intended to diagnose MRSA infection nor to guide or monitor treatment for MRSA infections. Test performance is not FDA approved in patients less than 21 years old. Performed at [facility], [address], [place], [place] [address]   Respiratory (~20 pathogens) panel by PCR     Status: None   Collection Time: 09/17/22 12:28 PM   Specimen: Nasopharyngeal Swab; Respiratory  Result Value Ref Range  Status   Adenovirus NOT DETECTED NOT DETECTED Final   Coronavirus 229E NOT DETECTED NOT DETECTED Final    Comment: (NOTE) The Coronavirus on the Respiratory Panel, DOES NOT test for the novel  Coronavirus (2019 nCoV)    Coronavirus HKU1 NOT DETECTED NOT DETECTED Final   Coronavirus NL63 NOT DETECTED NOT DETECTED Final   Coronavirus  OC43 NOT DETECTED NOT DETECTED Final   Metapneumovirus NOT DETECTED NOT DETECTED Final   Rhinovirus / Enterovirus NOT DETECTED NOT DETECTED Final   Influenza A NOT DETECTED NOT DETECTED Final   Influenza B NOT DETECTED NOT DETECTED Final   Parainfluenza Virus 1 NOT DETECTED NOT DETECTED Final   Parainfluenza Virus 2 NOT DETECTED NOT DETECTED Final   Parainfluenza Virus 3 NOT DETECTED NOT DETECTED Final   Parainfluenza Virus 4 NOT DETECTED NOT DETECTED Final   Respiratory Syncytial Virus NOT DETECTED NOT DETECTED Final   Bordetella pertussis NOT DETECTED NOT DETECTED Final   Bordetella Parapertussis NOT DETECTED NOT DETECTED Final   Chlamydophila pneumoniae NOT DETECTED NOT DETECTED Final   Mycoplasma pneumoniae NOT DETECTED NOT DETECTED Final    Comment: Performed at [facility], [address] [place], [place] [address]  Expectorated Sputum Assessment w Gram Stain, Rflx to Resp Cult     Status: None   Collection Time: 09/17/22 10:39 PM   Specimen: Sputum  Result Value Ref Range Status   Specimen Description SPUTUM  Final   Special Requests NONE  Final   Sputum evaluation   Final    THIS SPECIMEN IS ACCEPTABLE FOR SPUTUM CULTURE Performed at [facility], [address] [place], [place] [address]    Report Status 09/17/2022 FINAL  Final  Culture, Respiratory w Gram Stain     Status: None (Preliminary result)   Collection Time: 09/17/22 10:39 PM   Specimen: SPU  Result Value Ref Range Status   Specimen Description   Final    SPUTUM Performed at [facility], [address] [place], [place] [address]     Special Requests   Final    NONE Reflexed from [id number] Performed at [facility], [address], [place], [place] [address]    Gram Stain   Final    RARE WBC PRESENT, PREDOMINANTLY PMN RARE GRAM POSITIVE COCCI Performed at [facility], [address], [place], [place] [address]    Culture PENDING  Incomplete   Report Status PENDING  Incomplete    Labs: CBC: Recent Labs  Lab 09/13/22 1000 09/16/22 1825 09/17/22 0122 09/18/22 0542  WBC 7.2 14.7* 11.5* 7.2  NEUTROABS 4.6  --  8.2*  --   HGB 14.3 15.8 13.5 12.8*  HCT 43.8 48.1 42.7 40.7  MCV 94.6 94.1 97.7 97.6  PLT 219.0 200 165 150   Basic Metabolic Panel: Recent Labs  Lab 09/13/22 1000 09/16/22 1825 09/17/22 0122 09/18/22 0542  NA 136 135 132* 133*  K 4.5 4.4 3.9 4.1  CL 104 99 101 106  CO2 24 24 23  21*  GLUCOSE 119* 118* 128* 119*  BUN 11 12 12 10   CREATININE 0.87 1.05 0.94 0.73  CALCIUM 8.8 9.0 8.1* 8.2*  MG  --   --  2.0  --   PHOS  --   --  3.6  --    Liver Function Tests: Recent Labs  Lab 09/13/22 1000 09/16/22 1825 09/17/22 0122  AST 16 25 18   ALT 23 29 22   ALKPHOS 59 67 48  BILITOT 0.5 0.8 0.7  PROT 6.3 7.6 6.4*  ALBUMIN 3.5 3.7 2.9*   CBG: No results for input(s): "GLUCAP" in the last 168 hours.  Discharge time spent: greater than 30 minutes.  Signed: Alba Cory, MD [facility] 09/18/2022

## 2022-09-18 NOTE — TOC Initial Note (Signed)
Transition of Care [facility]) - Initial/Assessment Note    Patient Details  Name: Robert Lang MRN: [id number] Date of Birth: [birth year]  Transition of Care [facility] CM/SW Contact:    Adrian Prows, RN Phone Number: 09/18/2022, 12:06 PM  Clinical Narrative:                 [facility] consult for d/c planning; spoke w/ pt and wife Robert Lang in room; pt says he is from home and plans to return at d/c; his wife is POC [phone]); he has transportation; he denies food/housing insecurity, difficulty paying utilities, and IPV; pt has glasses, cane. Electric wheel chair, Rolator, BSC, and shower chair; he has HH services w/ [facility], and this is his agency preference; he agrees to recc home oxygen and nebulizer; he does not have agency preference for this service; contacted Jermaine at [employer] and agency can provide service; travel tank and nebulizer will be delivered to room; agency contact info placed in follow up provider section of d/c instructions; Barbara Cower at [employer] given notification of pt location; awaiting PT/OT eval; TOC will follow.  Expected Discharge Plan: Home w Home Health Services Barriers to Discharge: Continued Medical Work up   Patient Goals and CMS Choice Patient states their goals for this hospitalization and ongoing recovery are:: home          Expected Discharge Plan and Services   Discharge Planning Services: CM Consult Post Acute Care Choice: Home Health ([facility])                   DME Arranged: Nebulizer machine, Oxygen DME Agency: [employer] Date DME Agency Contacted: 09/18/22 Time DME Agency Contacted: 1203 Representative spoke with at DME Agency: Vaughan Basta            Prior Living Arrangements/Services   Lives with:: Self Patient language and need for interpreter reviewed:: Yes Do you feel safe going back to the place where you live?: Yes      Need for Family Participation in Patient Care: Yes (Comment) Care giver support system in  place?: Yes (comment) Current home services: DME (cane, rolator, electric wheelchair, shower chair) Criminal Activity/Legal Involvement Pertinent to Current Situation/Hospitalization: No - Comment as needed  Activities of Daily Living   ADL Screening (condition at time of admission) Patient's cognitive ability adequate to safely complete daily activities?: Yes Is the patient deaf or have difficulty hearing?: No Does the patient have difficulty seeing, even when wearing glasses/contacts?: No Does the patient have difficulty concentrating, remembering, or making decisions?: No Patient able to express need for assistance with ADLs?: Yes Does the patient have difficulty dressing or bathing?: Yes Independently performs ADLs?: No Communication: Independent Dressing (OT): Needs assistance Is this a change from baseline?: Pre-admission baseline Grooming: Independent Feeding: Independent Bathing: Needs assistance Is this a change from baseline?: Pre-admission baseline Toileting: Independent, Independent with device (comment) In/Out Bed: Independent with device (comment) Walks in Home: Independent with device (comment) Does the patient have difficulty walking or climbing stairs?: Yes Weakness of Legs: None Weakness of Arms/Hands: None  Permission Sought/Granted Permission sought to share information with : Case Manager Permission granted to share information with : Yes, Verbal Permission Granted  Share Information with NAME: Case Manager     Permission granted to share info w Relationship: Zaidan Eschbach (spouse) [phone]     Emotional Assessment Appearance:: Appears stated age Attitude/Demeanor/Rapport: Gracious Affect (typically observed): Accepting Orientation: : Oriented to Self, Oriented to Place, Oriented to  Time, Oriented to Situation  Alcohol / Substance Use: Not Applicable Psych Involvement: No (comment)  Admission diagnosis:  Tachycardia [R00.0] Generalized  abdominal pain [R10.84] Hypoxia [R09.02] Acute respiratory failure with hypoxia (HCC) [J96.01] Other acute pulmonary embolism without acute cor pulmonale (HCC) [I26.99] Aspiration pneumonia of both lower lobes, unspecified aspiration pneumonia type (HCC) [J69.0] Sepsis, due to unspecified organism, unspecified whether acute organ dysfunction present (HCC) [A41.9] Other acute pulmonary embolism, unspecified whether acute cor pulmonale present [facility]) [I26.99] Patient Active Problem List   Diagnosis Date Noted   History of COPD 09/17/2022   History of pulmonary embolism 09/17/2022   History of cholecystitis 09/17/2022   GAD (generalized anxiety disorder) 09/17/2022   Allergic rhinitis 09/17/2022   BPH (benign prostatic hyperplasia) 09/17/2022   Acute respiratory failure with hypoxia (HCC) 09/16/2022   Aspiration pneumonia of both lower lobes (HCC) 09/16/2022   Single subsegmental pulmonary embolism without acute cor pulmonale (HCC) 08/31/2022   COPD exacerbation (HCC) 08/31/2022   COPD with exacerbation (HCC) 08/29/2022   Acute respiratory failure with hypoxemia (HCC) 08/28/2022   Centrilobular emphysema (HCC) 09/03/2021   Acute bacterial rhinosinusitis 09/03/2021   Hospital discharge follow-up 09/03/2021   Cognitive changes 09/03/2021   Recurrent syncope 08/27/2021   Acute metabolic encephalopathy 08/27/2021   Lactic acidosis 08/27/2021   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/27/2021   History of COVID-19 08/27/2021   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   Appendicitis 01/15/2021   Alcohol abuse 01/15/2021   OSA (obstructive sleep apnea) 01/15/2021   Severe sepsis (HCC) 01/15/2021   IPF (idiopathic pulmonary fibrosis) (HCC) 07/28/2020   Snoring 05/13/2020   Kidney stones    Hypertension    Hyperlipidemia    History of colon polyps    Heart attack (HCC)    GERD (gastroesophageal reflux disease)    Depression    CAD (coronary artery disease)    Paroxysmal atrial fibrillation (HCC)     Arthritis    Anxiety    Allergy    Smoker 01/11/2019   ETOH abuse 01/11/2019   Acute ST elevation myocardial infarction (STEMI) of inferolateral wall (HCC) 01/10/2019   Dyspnea on exertion 10/20/2018   Status post ablation of atrial flutter 07/06/2018   Coronary artery disease 07/06/2018   Essential hypertension 07/06/2018   Dyslipidemia, goal LDL below 70 07/06/2018   Smoking 07/06/2018   Neuropathy 07/06/2018   Dizziness 10/28/2017   Falls 10/28/2017   Heart palpitations 01/27/2017   Anticoagulated 07/01/2016   H/O amiodarone therapy 07/01/2016   PLMD (periodic limb movement disorder) 11/04/2015   Obstructive sleep apnea 08/05/2015   Atrial flutter (HCC) 06/26/2015   Chest pain 06/26/2015   COPD (chronic obstructive pulmonary disease) (HCC) 06/26/2015   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   Allergic rhinitis due to pollen 09/03/2013   DDD (degenerative disc disease), cervical 09/03/2013   Depressive disorder 09/03/2013   Esophageal reflux 09/03/2013   Neck pain 09/03/2013   Polycythemia, secondary 09/03/2013   Pure hypercholesterolemia 09/03/2013   Screening for prostate cancer 09/03/2013   PCP:  [facility]:   [facility] [id number] [place], [place] - [address] AT [facility] [address] [place] [address] Phone: [phone] Fax: [phone]  [facility] [address] [place] [address] Phone: [phone] Fax: [phone]  [facility] [place], [place] - [address] [place] [address] Phone: [phone] Fax: [phone]     Social Determinants of Health (SDOH) Social History:  SDOH Screenings   Food Insecurity: No Food Insecurity (09/18/2022)  Housing: Low Risk  (09/18/2022)  Transportation Needs: No Transportation Needs (09/18/2022)  Utilities: Not At Risk (09/18/2022)  Alcohol Screen: Low Risk  (11/25/2020)  Depression (PHQ2-9): Low Risk  (07/09/2022)   Financial Resource Strain: Low Risk  (12/22/2021)  Physical Activity: Inactive (12/22/2021)  Social Connections: Moderately Integrated (12/22/2021)  Stress: No Stress Concern Present (12/22/2021)  Tobacco Use: Medium Risk (09/17/2022)   SDOH Interventions: Food Insecurity Interventions: Intervention Not Indicated, Inpatient TOC Housing Interventions: Intervention Not Indicated, Inpatient TOC Transportation Interventions: Intervention Not Indicated, Inpatient TOC Utilities Interventions: Intervention Not Indicated, Inpatient TOC   Readmission Risk Interventions     No data to display

## 2022-09-18 NOTE — Plan of Care (Signed)
  Problem: Education: Goal: Knowledge of General Education information will improve Description: Including pain rating scale, medication(s)/side effects and non-pharmacologic comfort measures 09/18/2022 1714 by Sherian Maroon, RN Outcome: Adequate for Discharge 09/18/2022 1714 by Sherian Maroon, RN Outcome: Progressing   Problem: Health Behavior/Discharge Planning: Goal: Ability to manage health-related needs will improve 09/18/2022 1714 by Sherian Maroon, RN Outcome: Adequate for Discharge 09/18/2022 1714 by Sherian Maroon, RN Outcome: Progressing   Problem: Clinical Measurements: Goal: Ability to maintain clinical measurements within normal limits will improve 09/18/2022 1714 by Sherian Maroon, RN Outcome: Adequate for Discharge 09/18/2022 1714 by Sherian Maroon, RN Outcome: Progressing Goal: Will remain free from infection 09/18/2022 1714 by Sherian Maroon, RN Outcome: Adequate for Discharge 09/18/2022 1714 by Sherian Maroon, RN Outcome: Progressing Goal: Diagnostic test results will improve 09/18/2022 1714 by Sherian Maroon, RN Outcome: Adequate for Discharge 09/18/2022 1714 by Sherian Maroon, RN Outcome: Progressing Goal: Respiratory complications will improve 09/18/2022 1714 by Sherian Maroon, RN Outcome: Adequate for Discharge 09/18/2022 1714 by Sherian Maroon, RN Outcome: Progressing Goal: Cardiovascular complication will be avoided 09/18/2022 1714 by Sherian Maroon, RN Outcome: Adequate for Discharge 09/18/2022 1714 by Sherian Maroon, RN Outcome: Progressing   Problem: Activity: Goal: Risk for activity intolerance will decrease 09/18/2022 1714 by Sherian Maroon, RN Outcome: Adequate for Discharge 09/18/2022 1714 by Sherian Maroon, RN Outcome: Progressing   Problem: Nutrition: Goal: Adequate nutrition will be maintained 09/18/2022 1714 by Sherian Maroon, RN Outcome: Adequate for Discharge 09/18/2022 1714 by  Sherian Maroon, RN Outcome: Progressing   Problem: Coping: Goal: Level of anxiety will decrease 09/18/2022 1714 by Sherian Maroon, RN Outcome: Adequate for Discharge 09/18/2022 1714 by Sherian Maroon, RN Outcome: Progressing   Problem: Elimination: Goal: Will not experience complications related to bowel motility 09/18/2022 1714 by Sherian Maroon, RN Outcome: Adequate for Discharge 09/18/2022 1714 by Sherian Maroon, RN Outcome: Progressing Goal: Will not experience complications related to urinary retention 09/18/2022 1714 by Sherian Maroon, RN Outcome: Adequate for Discharge 09/18/2022 1714 by Sherian Maroon, RN Outcome: Progressing   Problem: Pain Managment: Goal: General experience of comfort will improve 09/18/2022 1714 by Sherian Maroon, RN Outcome: Adequate for Discharge 09/18/2022 1714 by Sherian Maroon, RN Outcome: Progressing   Problem: Safety: Goal: Ability to remain free from injury will improve 09/18/2022 1714 by Sherian Maroon, RN Outcome: Adequate for Discharge 09/18/2022 1714 by Sherian Maroon, RN Outcome: Progressing   Problem: Skin Integrity: Goal: Risk for impaired skin integrity will decrease 09/18/2022 1714 by Sherian Maroon, RN Outcome: Adequate for Discharge 09/18/2022 1714 by Sherian Maroon, RN Outcome: Progressing

## 2022-09-18 NOTE — Progress Notes (Signed)
SATURATION QUALIFICATIONS: (This note is used to comply with regulatory documentation for home oxygen)  Patient Saturations on Room Air at Rest = 94%  Patient Saturations on Room Air while Ambulating = 89%  Patient Saturations on 0 Liters of oxygen while Ambulating = 96%  Please briefly explain why patient needs home oxygen: While ambulating 89%, yet went up to 96% after stopping for 1 minute and pursed lip breathing.

## 2022-09-18 NOTE — Progress Notes (Signed)
09/18/2022 Chart and Dr. Delton Coombes, surgery notes reviewed.  Minimal O2 need, would do home O2 eval and DC at primary discretion.  Will arrange 2-3 week f/u in pulmonary office.  Please reach out PRN.  Myrla Halsted MD PCCM

## 2022-09-18 NOTE — Progress Notes (Signed)
Assessment unchanged. Pt and wife verbalized understanding of dc instructions including what medications to resume, follow up care and drain care. Supplies provided to care for drain ie., flushes and drain sponges. Discharged via wc to front entrance accompanied by NT and wife.

## 2022-09-18 NOTE — Progress Notes (Signed)
SATURATION QUALIFICATIONS: (This note is used to comply with regulatory documentation for home oxygen)  Patient Saturations on Room Air at Rest = 94%  Patient Saturations on Room Air while Ambulating = 89%  Patient Saturations on 0 Liters of oxygen while Ambulating = 96%  Please briefly explain why patient needs home oxygen: While ambulating 89%, yet went up to 96% after stopping for 1 minute and pursed lip breathing. 

## 2022-09-18 NOTE — Progress Notes (Signed)
Mobility Specialist - Progress Note   09/18/22 0914  Oxygen Therapy  SpO2 (!) 87 %  O2 Device Nasal Cannula  O2 Flow Rate (L/min) 2 L/min  Patient Activity (if Appropriate) Ambulating  Mobility  Activity Ambulated with assistance in hallway  Level of Assistance Standby assist, set-up cues, supervision of patient - no hands on  Assistive Device Front wheel walker  Distance Ambulated (ft) 250 ft  Activity Response Tolerated well  Mobility Referral Yes  $Mobility charge 1 Mobility  Mobility Specialist Start Time (ACUTE ONLY) 0902  Mobility Specialist Stop Time (ACUTE ONLY) 0913  Mobility Specialist Time Calculation (min) (ACUTE ONLY) 11 min   Pt received in bed and agreeable to mobility. During ambulation, pt desat to 87%. Encouraged a standing rest break with pursed lip breathing allowing O2 to come back up to 89%. No complaints during session. Pt to bed after session with all needs met.    Pre-mobility: 106 HR, 90% SpO2 (2L [place]) During mobility:  87%-89% SpO2 (2L [place]) Post-mobility: 101 HR, 92% SPO2 (2L [place])  Chief Technology Officer

## 2022-09-19 LAB — CULTURE, RESPIRATORY W GRAM STAIN

## 2022-09-19 LAB — CULTURE, BLOOD (ROUTINE X 2): Special Requests: ADEQUATE

## 2022-09-19 NOTE — Progress Notes (Signed)
Notified Robert Lang at [employer] that tank previously delivered to pt's room not need; pt did not qualify; tank is at [facility] nurse's station and can be picked up; he will contact agency driver for pick up; pt notified; he says he did not receive tank before d/c.

## 2022-09-20 ENCOUNTER — Telehealth: Payer: Self-pay | Admitting: Medical

## 2022-09-20 ENCOUNTER — Ambulatory Visit: Payer: Medicare Other | Admitting: Cardiology

## 2022-09-20 LAB — CULTURE, BLOOD (ROUTINE X 2)

## 2022-09-20 LAB — CULTURE, RESPIRATORY W GRAM STAIN

## 2022-09-20 NOTE — Telephone Encounter (Signed)
Spoke with Robert Lang , made pt aware

## 2022-09-20 NOTE — Telephone Encounter (Signed)
Patient's wife called to advise that he was in the hospital again and he was told to follow up within a week. Pt's wife called to see if it is necessary for him to come back again since he just saw edward recently. Please call to advise

## 2022-09-20 NOTE — Telephone Encounter (Signed)
Pt can keep his 1 month follow up on 10/08/22 , no need to another visit

## 2022-09-21 ENCOUNTER — Telehealth: Payer: Self-pay | Admitting: Pharmacy Technician

## 2022-09-21 ENCOUNTER — Telehealth: Payer: Self-pay | Admitting: *Deleted

## 2022-09-21 ENCOUNTER — Encounter: Payer: Self-pay | Admitting: *Deleted

## 2022-09-21 DIAGNOSIS — I2693 Single subsegmental pulmonary embolism without acute cor pulmonale: Secondary | ICD-10-CM | POA: Diagnosis not present

## 2022-09-21 DIAGNOSIS — Z5986 Financial insecurity: Secondary | ICD-10-CM

## 2022-09-21 DIAGNOSIS — I330 Acute and subacute infective endocarditis: Secondary | ICD-10-CM | POA: Diagnosis not present

## 2022-09-21 DIAGNOSIS — G629 Polyneuropathy, unspecified: Secondary | ICD-10-CM | POA: Diagnosis not present

## 2022-09-21 DIAGNOSIS — I4892 Unspecified atrial flutter: Secondary | ICD-10-CM | POA: Diagnosis not present

## 2022-09-21 DIAGNOSIS — I252 Old myocardial infarction: Secondary | ICD-10-CM | POA: Diagnosis not present

## 2022-09-21 DIAGNOSIS — J432 Centrilobular emphysema: Secondary | ICD-10-CM | POA: Diagnosis not present

## 2022-09-21 DIAGNOSIS — J9601 Acute respiratory failure with hypoxia: Secondary | ICD-10-CM | POA: Diagnosis not present

## 2022-09-21 DIAGNOSIS — E7800 Pure hypercholesterolemia, unspecified: Secondary | ICD-10-CM | POA: Diagnosis not present

## 2022-09-21 DIAGNOSIS — I10 Essential (primary) hypertension: Secondary | ICD-10-CM | POA: Diagnosis not present

## 2022-09-21 DIAGNOSIS — F1010 Alcohol abuse, uncomplicated: Secondary | ICD-10-CM | POA: Diagnosis not present

## 2022-09-21 DIAGNOSIS — I2510 Atherosclerotic heart disease of native coronary artery without angina pectoris: Secondary | ICD-10-CM | POA: Diagnosis not present

## 2022-09-21 DIAGNOSIS — J441 Chronic obstructive pulmonary disease with (acute) exacerbation: Secondary | ICD-10-CM | POA: Diagnosis not present

## 2022-09-21 DIAGNOSIS — F418 Other specified anxiety disorders: Secondary | ICD-10-CM | POA: Diagnosis not present

## 2022-09-21 DIAGNOSIS — I480 Paroxysmal atrial fibrillation: Secondary | ICD-10-CM | POA: Diagnosis not present

## 2022-09-21 DIAGNOSIS — J84112 Idiopathic pulmonary fibrosis: Secondary | ICD-10-CM | POA: Diagnosis not present

## 2022-09-21 DIAGNOSIS — I452 Bifascicular block: Secondary | ICD-10-CM | POA: Diagnosis not present

## 2022-09-21 LAB — CULTURE, BLOOD (ROUTINE X 2)
Culture: NO GROWTH
Special Requests: ADEQUATE

## 2022-09-21 NOTE — Transitions of Care (Post Inpatient/ED Visit) (Signed)
09/21/2022  Name: Robert Lang MRN: [id number] DOB: 11-May-1950  Today's TOC FU Call Status: Today's TOC FU Call Status:: Successful TOC FU Call Competed TOC FU Call Complete Date: 09/21/22  Transition Care Management Follow-up Telephone Call Date of Discharge: 09/18/22 Discharge Facility: [facility]) Type of Discharge: Inpatient Admission Primary Inpatient Discharge Diagnosis:: aspiration pneumonia; sepsis; pulmonary fibrosis How have you been since you were released from the hospital?: Better ("I am doing okay I guess.  My gallbladder drain is not draining very much, but the doctors tell us that is normal.  I did not qualify for the home O2-- I was just barely under the limits.  I am keeping track of my oxygen levels at home.") Any questions or concerns?: Yes Patient Questions/Concerns:: Cost of Eliquis- not sure if he might qualify for PAP Patient Questions/Concerns Addressed: Other: (sent message to Haynes Hoehn, Pharmacy Director to explore options for PAP/ assistance filling out PAP form IF patient qualifies; provided basic education around qualification for PAP)  Items Reviewed: Did you receive and understand the discharge instructions provided?: Yes (thoroughly reviewed with patient who verbalizes good understanding of same) Medications obtained,verified, and reconciled?: Yes (Medications Reviewed) (Full medication reconciliation/ review completed; no concerns or discrepancies identified; confirmed patient obtained/ is taking all newly Rx'd medications as instructed; spouse-manages medications and denies questions/ concerns around medications today) Any new allergies since your discharge?: No Dietary orders reviewed?: Yes Type of Diet Ordered:: Low salt; heart healthy Do you have support at home?: Yes People in Home: spouse Name of Support/Comfort Primary Source: Reports essentially independent in self-care activities; supportive spouse [name] assists as/ if needed/  indicated  Medications Reviewed Today: Medications Reviewed Today     Reviewed by Michaela Corner, RN (Registered Nurse) on 09/21/22 at 1036  Med List Status: <None>   Medication Order Taking? Sig Documenting Provider Last Dose Status Informant  amoxicillin-clavulanate (AUGMENTIN) 875-125 MG tablet [id number] Yes Take 1 tablet by mouth every 12 (twelve) hours for 6 days. Regalado, Belkys A, MD Taking Active   apixaban (ELIQUIS) 5 MG TABS tablet [id number] Yes Take 1 tablet (5 mg total) by mouth 2 (two) times daily. Regalado, Belkys A, MD Taking Active   atorvastatin (LIPITOR) 80 MG tablet [id number] Yes Take 1 tablet (80 mg total) by mouth daily. Georgeanna Lea, MD Taking Active Spouse/Significant Other, Pharmacy Records  buPROPion (WELLBUTRIN XL) 300 MG 24 hr tablet [id number] Yes Take 1 tablet (300 mg total) by mouth daily. Joan Flores, NP Taking Active Spouse/Significant Other, Pharmacy Records  busPIRone (BUSPAR) 15 MG tablet [id number] Yes Take 1 tablet (15 mg total) by mouth 3 (three) times daily. Joan Flores, NP Taking Active Spouse/Significant Other, Pharmacy Records  Cholecalciferol (VITAMIN D3) 25 MCG (1000 UT) CAPS [id number] Yes Take 2 capsules by mouth 2 (two) times daily. [provider] Taking Active Spouse/Significant Other, Pharmacy Records  docusate sodium (COLACE) 100 MG capsule [id number] Yes Take 100 mg by mouth daily. [provider] Taking Active Spouse/Significant Other, Pharmacy Records  famotidine (PEPCID) 40 MG tablet [id number] Yes TAKE 1 TABLET(40 MG) BY MOUTH AT BEDTIME  Patient taking differently: Take 40 mg by mouth at bedtime.   Georgeanna Lea, MD Taking Active Spouse/Significant Other, Pharmacy Records  fexofenadine [facility]) 180 MG tablet [id number] Yes Take 180 mg by mouth at bedtime.  [provider] Taking Active Spouse/Significant Other, Pharmacy Records  finasteride (PROSCAR) 5 MG tablet [id number] Yes Take 1 tablet (5 mg  total) by mouth daily.  Georgeanna Lea, MD Taking Active Spouse/Significant Other, Pharmacy Records  guaiFENesin [facility]) 600 MG 12 hr tablet [id number] Yes Take 2 tablets (1,200 mg total) by mouth 2 (two) times daily. Regalado, Belkys A, MD Taking Active   lamoTRIgine (LAMICTAL) 100 MG tablet [id number] Yes Take 1 tablet (100 mg total) by mouth daily. Joan Flores, NP Taking Active Spouse/Significant Other, Pharmacy Records           Med Note Michaela Corner   Tue Sep 21, 2022 10:24 AM) 09/21/22: Reports during TOC call taking as prescribed- to start higher dose of 100 mg every day tomorrow 09/22/22  levalbuterol (XOPENEX) 1.25 MG/0.5ML nebulizer solution [id number] Yes Take 1.25 mg by nebulization every 4 (four) hours as needed for wheezing or shortness of breath. Regalado, Prentiss Bells, MD Taking Active   LORazepam (ATIVAN) 0.5 MG tablet [id number] Yes Take one tablet by mouth only for severe anxiety or agitation  Patient taking differently: Take 0.5 mg by mouth daily as needed for anxiety. Take one tablet by mouth only for severe anxiety or agitation. Has not taking medication yet.   Joan Flores, NP Taking Active Spouse/Significant Other, Pharmacy Records           Med Note Michaela Corner   Tue Sep 21, 2022 10:31 AM) 09/21/22: Reports during TOC call has not needed yet post-hospital discharge on 09/18/22   Melatonin 10 MG TABS [id number] Yes Take 1 tablet by mouth at bedtime. [provider] Taking Active Spouse/Significant Other, Pharmacy Records  Multiple Vitamins-Minerals (PRESERVISION AREDS PO) [id number] Yes Take 1 capsule by mouth in the morning and at bedtime. [provider] Taking Active Spouse/Significant Other, Pharmacy Records  nitroGLYCERIN (NITROSTAT) 0.4 MG SL tablet [id number] Yes Place 1 tablet (0.4 mg total) under the tongue every 5 (five) minutes as needed for chest pain. Georgeanna Lea, MD Taking Active Spouse/Significant Other, Pharmacy Records  ondansetron  [facility]) 4 MG tablet [id number] Yes Take 1 tablet (4 mg total) by mouth every 8 (eight) hours as needed for nausea or vomiting. [name], [name], [name] Taking Active Spouse/Significant Other, Pharmacy Records  pantoprazole (PROTONIX) 40 MG tablet [id number] Yes TAKE 1 TABLET(40 MG) BY MOUTH DAILY  Patient taking differently: Take 40 mg by mouth daily.   Lynann Bologna, MD Taking Active Spouse/Significant Other, Pharmacy Records  Pirfenidone (ESBRIET) 801 MG TABS [id number] Yes Take 1 tablet (801 mg total) by mouth 3 (three) times daily. Kalman Shan, MD Taking Active Spouse/Significant Other, Pharmacy Records           Med Note Raquel James, Natale Lay Aug 29, 2022  1:40 AM) Wife bringing med 6/9 am  Polyethylene Glycol 3350 (MIRALAX PO) [id number] Yes Take 17 g by mouth daily. [provider] Taking Active Spouse/Significant Other, Pharmacy Records  pregabalin (LYRICA) 150 MG capsule [id number] Yes Take 1 capsule (150 mg total) by mouth 2 (two) times daily. Windell Norfolk, MD Taking Active Spouse/Significant Other, Pharmacy Records  ranolazine (RANEXA) 1000 MG SR tablet [id number] Yes Take 1 tablet (1,000 mg total) by mouth 2 (two) times daily. Georgeanna Lea, MD Taking Active Spouse/Significant Other, Pharmacy Records  sertraline (ZOLOFT) 100 MG tablet [id number] Yes Take 1.5 tablets (150 mg total) by mouth at bedtime. Joan Flores, NP Taking Active Spouse/Significant Other, Pharmacy Records  Sodium Chloride Flush (NORMAL SALINE FLUSH) 0.9 % SOLN [id number] Yes Flush drain with 5 mLs daily. Discard remaining 5ml in each syringe. Burnadette Pop, MD Taking Active Spouse/Significant Other, Pharmacy  Records  traZODone (DESYREL) 50 MG tablet [id number] Yes Take 1 tablet (50 mg total) by mouth at bedtime as needed for sleep (May take 1/2 tab to 1 tablet for sleep, as needed.). Joan Flores, NP Taking Active Spouse/Significant Other, Pharmacy Records  umeclidinium-vilanterol [facility] ELLIPTA)  62.5-25 MCG/ACT AEPB [id number] Yes Inhale 1 puff into the lungs daily. Burnadette Pop, MD Taking Active Spouse/Significant Other, Pharmacy Records  vitamin B-12 (CYANOCOBALAMIN) 1000 MCG tablet [id number] Yes Take 1,000 mcg by mouth daily. [provider] Taking Active Spouse/Significant Other, Pharmacy Records  zolpidem (AMBIEN) 5 MG tablet [id number] Yes Take 1 tablet (5 mg total) by mouth at bedtime as needed for sleep. Windell Norfolk, MD Taking Active Spouse/Significant Other, Pharmacy Records            Home Care and Equipment/Supplies: Were Home Health Services Ordered?: Yes Name of Home Health Agency:: [facility] Has Agency set up a time to come to your home?: Yes First Home Health Visit Date: 09/21/22 Any new equipment or medical supplies ordered?: Yes (nebulizer- did not qualify for home O2) Name of Medical supply agency?: [employer] Were you able to get the equipment/medical supplies?: Yes Do you have any questions related to the use of the equipment/supplies?: No  Functional Questionnaire: Do you need assistance with bathing/showering or dressing?: No (wife assists/ supervises as needed/ indicated) Do you need assistance with meal preparation?: Yes (wife prepares all meals) Do you need assistance with eating?: No Do you have difficulty maintaining continence: No Do you need assistance with getting out of bed/getting out of a chair/moving?: No Do you have difficulty managing or taking your medications?: Yes (spouse handles all aspects of medication administration)  Follow up appointments reviewed: PCP Follow-up appointment confirmed?: Yes (verified patient called PCP and was told that his 10/08/22 is "soon enough" for HFU) Date of PCP follow-up appointment?: 10/08/22 (verified this is recommended time frame for follow up per hospital discharging provider notes) Follow-up Provider: PCP, [facility] Follow-up appointment confirmed?: Yes Date of Specialist  follow-up appointment?: 10/13/22 (verified this is recommended time frame for follow up per hospital discharging provider notes) Follow-Up Specialty Provider:: pulmonary provider 10/13/22; 10/11/22-- IR to assess gallbladder drain; 10/13/22: surgical and pulmonary providers Do you need transportation to your follow-up appointment?: No Do you understand care options if your condition(s) worsen?: Yes-patient verbalized understanding  SDOH Interventions Today    Flowsheet Row Most Recent Value  SDOH Interventions   Food Insecurity Interventions Intervention Not Indicated  Transportation Interventions Intervention Not Indicated  [spouse provides all transportation]      TOC Interventions Today    Flowsheet Row Most Recent Value  TOC Interventions   TOC Interventions Discussed/Reviewed TOC Interventions Discussed  [provided my direct contact information should questions/ concerns/ needs arise post-TOC call, prior to RN CM telephone visit 10/14/22]      Interventions Today    Flowsheet Row Most Recent Value  Chronic Disease   Chronic disease during today's visit Other, Chronic Obstructive Pulmonary Disease (COPD)  [sepsis/ pneumonia,  gallbladder surgery pending- has drain]  General Interventions   General Interventions Discussed/Reviewed General Interventions Discussed, Doctor Visits, Referral to Nurse, Communication with, Durable Medical Equipment (DME)  Doctor Visits Discussed/Reviewed Doctor Visits Discussed, Specialist, PCP  Durable Medical Equipment (DME) Dan Humphreys, Wheelchair, Tour manager, Other  [obtained/ using new nebulizer post- recent hospital visit, confirmed with caregiver that patient uses assistive devices on regular basis, at [employer], [employer]  PCP/Specialist Visits Compliance with follow-up visit  Communication  with Pharmacists, RN  [sent secure message to Pharmacy Director to inquire bout assistance available to explore PAP application process for  Eliquis]  Exercise Interventions   Exercise Discussed/Reviewed Exercise Discussed  [facility] services]  Education Interventions   Education Provided Provided Education  Provided Verbal Education On Nutrition, Medication, Other, Applications  [PAP application process-- Eliquis,  safe [place] of NTG at home,  need to continue monitoring/ recording SaO2 levels at home and to take to pulmonary provider]  Nutrition Interventions   Nutrition Discussed/Reviewed Nutrition Discussed, Decreasing salt  Pharmacy Interventions   Pharmacy Dicussed/Reviewed Pharmacy Topics Discussed  Safety Interventions   Safety Discussed/Reviewed Safety Discussed      Caryl Pina, RN, BSN, CCRN Alumnus RN CM Care Coordination/ Transition of Care- [facility] Care Management [phone]: direct office

## 2022-09-21 NOTE — Progress Notes (Signed)
[facility] Customer service manager [facility])  [facility] Quality Pharmacy Team   09/21/2022  Robert Lang [birth year] [id number]  Reason for referral: Medication assistance for Eliquis  Referral source: [facility] RN Su Hilt [place] Current insurance: [employer]  Outreach:  Successful telephone call with patient and his spouse [name].  HIPAA identifiers verified. Per report income, patient appears to meet the income requirement for BMS patient assistance foundation. Patient is a household of 2 and income is within guidelines for the program.  Patient files taxes but is unable to gain access to them at this time. There is an option that patient can check to have BMS verify income. Patient's wife is amenable to checking that box on the application. It is unclear if patient has met the 3% out of pocket requirement for prescription medications for the household.Patient's wife is aware to obtain pharmacy printouts for both the patient and herself for submission to BMS.   Medication Assistance Findings:  Medication assistance needs identified: Eliquis  Extra Help:  Not eligible for Extra Help Low Income Subsidy based on reported income and assets  Additional medication assistance options reviewed with patient as warranted:  No other options identified. [employer] patient assistance may also be an option. Will defer until patient's out of pocket expediture is received as [employer] requires him to have spent $600 out of pocket before enrollment into their program.  Plan: I will route patient assistance letter to [facility] pharmacy technician who will coordinate patient assistance program application process for medications listed above.  [facility] pharmacy technician will assist with obtaining all required documents from both patient and provider(s) and submit application(s) once completed.  Thank you for allowing [facility] pharmacy to be a part of this patient's care.   Pattricia Boss, CPhT [place]  [facility] Office: [phone] Fax: [phone] Email: [email or web address]

## 2022-09-24 ENCOUNTER — Telehealth: Payer: Self-pay | Admitting: Medical

## 2022-09-24 NOTE — Telephone Encounter (Signed)
VO given.

## 2022-09-24 NOTE — Telephone Encounter (Signed)
Front desk,  I saw this patient but then he ended up in the ER and got hospitalized because of gallbladder issues.  Dr. Katrinka Blazing is wanting a quick follow-up.  Please at least arrange a video follow-up for post hospital with a nap

## 2022-09-24 NOTE — Telephone Encounter (Signed)
Patient was scheduled with Buelah Manis, NP on 7/24 at 130pm for a HFU.

## 2022-09-24 NOTE — Telephone Encounter (Signed)
Home health called needing verbal orders for a nurse evaluation. Please call [phone].

## 2022-09-27 DIAGNOSIS — I2510 Atherosclerotic heart disease of native coronary artery without angina pectoris: Secondary | ICD-10-CM | POA: Diagnosis not present

## 2022-09-27 DIAGNOSIS — I4892 Unspecified atrial flutter: Secondary | ICD-10-CM | POA: Diagnosis not present

## 2022-09-27 DIAGNOSIS — F418 Other specified anxiety disorders: Secondary | ICD-10-CM | POA: Diagnosis not present

## 2022-09-27 DIAGNOSIS — I452 Bifascicular block: Secondary | ICD-10-CM | POA: Diagnosis not present

## 2022-09-27 DIAGNOSIS — I480 Paroxysmal atrial fibrillation: Secondary | ICD-10-CM | POA: Diagnosis not present

## 2022-09-27 DIAGNOSIS — I330 Acute and subacute infective endocarditis: Secondary | ICD-10-CM | POA: Diagnosis not present

## 2022-09-27 DIAGNOSIS — F1010 Alcohol abuse, uncomplicated: Secondary | ICD-10-CM | POA: Diagnosis not present

## 2022-09-27 DIAGNOSIS — G629 Polyneuropathy, unspecified: Secondary | ICD-10-CM | POA: Diagnosis not present

## 2022-09-27 DIAGNOSIS — I252 Old myocardial infarction: Secondary | ICD-10-CM | POA: Diagnosis not present

## 2022-09-27 DIAGNOSIS — I2693 Single subsegmental pulmonary embolism without acute cor pulmonale: Secondary | ICD-10-CM | POA: Diagnosis not present

## 2022-09-27 DIAGNOSIS — J432 Centrilobular emphysema: Secondary | ICD-10-CM | POA: Diagnosis not present

## 2022-09-27 DIAGNOSIS — E7800 Pure hypercholesterolemia, unspecified: Secondary | ICD-10-CM | POA: Diagnosis not present

## 2022-09-27 DIAGNOSIS — J441 Chronic obstructive pulmonary disease with (acute) exacerbation: Secondary | ICD-10-CM | POA: Diagnosis not present

## 2022-09-27 DIAGNOSIS — J9601 Acute respiratory failure with hypoxia: Secondary | ICD-10-CM | POA: Diagnosis not present

## 2022-09-27 DIAGNOSIS — J84112 Idiopathic pulmonary fibrosis: Secondary | ICD-10-CM | POA: Diagnosis not present

## 2022-09-27 DIAGNOSIS — I10 Essential (primary) hypertension: Secondary | ICD-10-CM | POA: Diagnosis not present

## 2022-09-28 DIAGNOSIS — I452 Bifascicular block: Secondary | ICD-10-CM | POA: Diagnosis not present

## 2022-09-28 DIAGNOSIS — I480 Paroxysmal atrial fibrillation: Secondary | ICD-10-CM | POA: Diagnosis not present

## 2022-09-28 DIAGNOSIS — G629 Polyneuropathy, unspecified: Secondary | ICD-10-CM | POA: Diagnosis not present

## 2022-09-28 DIAGNOSIS — I4892 Unspecified atrial flutter: Secondary | ICD-10-CM | POA: Diagnosis not present

## 2022-09-28 DIAGNOSIS — I330 Acute and subacute infective endocarditis: Secondary | ICD-10-CM | POA: Diagnosis not present

## 2022-09-28 DIAGNOSIS — J441 Chronic obstructive pulmonary disease with (acute) exacerbation: Secondary | ICD-10-CM | POA: Diagnosis not present

## 2022-09-28 DIAGNOSIS — F418 Other specified anxiety disorders: Secondary | ICD-10-CM | POA: Diagnosis not present

## 2022-09-28 DIAGNOSIS — I2510 Atherosclerotic heart disease of native coronary artery without angina pectoris: Secondary | ICD-10-CM | POA: Diagnosis not present

## 2022-09-28 DIAGNOSIS — F1010 Alcohol abuse, uncomplicated: Secondary | ICD-10-CM | POA: Diagnosis not present

## 2022-09-28 DIAGNOSIS — I252 Old myocardial infarction: Secondary | ICD-10-CM | POA: Diagnosis not present

## 2022-09-28 DIAGNOSIS — J9601 Acute respiratory failure with hypoxia: Secondary | ICD-10-CM | POA: Diagnosis not present

## 2022-09-28 DIAGNOSIS — J432 Centrilobular emphysema: Secondary | ICD-10-CM | POA: Diagnosis not present

## 2022-09-28 DIAGNOSIS — I10 Essential (primary) hypertension: Secondary | ICD-10-CM | POA: Diagnosis not present

## 2022-09-28 DIAGNOSIS — J84112 Idiopathic pulmonary fibrosis: Secondary | ICD-10-CM | POA: Diagnosis not present

## 2022-09-28 DIAGNOSIS — I2693 Single subsegmental pulmonary embolism without acute cor pulmonale: Secondary | ICD-10-CM | POA: Diagnosis not present

## 2022-09-28 DIAGNOSIS — E7800 Pure hypercholesterolemia, unspecified: Secondary | ICD-10-CM | POA: Diagnosis not present

## 2022-09-30 ENCOUNTER — Telehealth: Payer: Self-pay | Admitting: Pharmacy Technician

## 2022-09-30 DIAGNOSIS — Z5986 Financial insecurity: Secondary | ICD-10-CM

## 2022-09-30 NOTE — Progress Notes (Signed)
[facility] Customer service manager [facility])                                            [facility] Quality Pharmacy Team    09/30/2022  Jamarkus Lisbon 1950-12-15 [id number]                                      Medication Assistance Referral  Referral From: [facility] RN Su Hilt [place]   Medication/Company: Eliquis / BMS Patient application portion:  Mailed Provider application portion: Faxed  to Dr. Gypsy Balsam Provider address/fax verified via: Office website   Pattricia Boss, CPhT [place]  [facility] Office: [phone] Fax: [phone] Email: [email or web address]

## 2022-10-04 ENCOUNTER — Telehealth: Payer: Self-pay | Admitting: Radiology

## 2022-10-04 DIAGNOSIS — I480 Paroxysmal atrial fibrillation: Secondary | ICD-10-CM | POA: Diagnosis not present

## 2022-10-04 DIAGNOSIS — I10 Essential (primary) hypertension: Secondary | ICD-10-CM | POA: Diagnosis not present

## 2022-10-04 DIAGNOSIS — G629 Polyneuropathy, unspecified: Secondary | ICD-10-CM | POA: Diagnosis not present

## 2022-10-04 DIAGNOSIS — J84112 Idiopathic pulmonary fibrosis: Secondary | ICD-10-CM | POA: Diagnosis not present

## 2022-10-04 DIAGNOSIS — I330 Acute and subacute infective endocarditis: Secondary | ICD-10-CM | POA: Diagnosis not present

## 2022-10-04 DIAGNOSIS — F418 Other specified anxiety disorders: Secondary | ICD-10-CM | POA: Diagnosis not present

## 2022-10-04 DIAGNOSIS — J441 Chronic obstructive pulmonary disease with (acute) exacerbation: Secondary | ICD-10-CM | POA: Diagnosis not present

## 2022-10-04 DIAGNOSIS — I452 Bifascicular block: Secondary | ICD-10-CM | POA: Diagnosis not present

## 2022-10-04 DIAGNOSIS — J432 Centrilobular emphysema: Secondary | ICD-10-CM | POA: Diagnosis not present

## 2022-10-04 DIAGNOSIS — I4892 Unspecified atrial flutter: Secondary | ICD-10-CM | POA: Diagnosis not present

## 2022-10-04 DIAGNOSIS — F1010 Alcohol abuse, uncomplicated: Secondary | ICD-10-CM | POA: Diagnosis not present

## 2022-10-04 DIAGNOSIS — I252 Old myocardial infarction: Secondary | ICD-10-CM | POA: Diagnosis not present

## 2022-10-04 DIAGNOSIS — I2693 Single subsegmental pulmonary embolism without acute cor pulmonale: Secondary | ICD-10-CM | POA: Diagnosis not present

## 2022-10-04 DIAGNOSIS — E7800 Pure hypercholesterolemia, unspecified: Secondary | ICD-10-CM | POA: Diagnosis not present

## 2022-10-04 DIAGNOSIS — J9601 Acute respiratory failure with hypoxia: Secondary | ICD-10-CM | POA: Diagnosis not present

## 2022-10-04 DIAGNOSIS — I2510 Atherosclerotic heart disease of native coronary artery without angina pectoris: Secondary | ICD-10-CM | POA: Diagnosis not present

## 2022-10-04 NOTE — Telephone Encounter (Signed)
72 y.o. patient with history of acute cholecystitis. Deemed not a surgical at that time due to a recent PE. s/p cholecystomy tube placement  by IR on 6.10.24. Patient called reporting a decrease in output X 3 days. Patient is flushing the drain BID and denies any pain or leaking around the exit site while flushing.  He currently denies fever, headache, chest pain, dyspnea, cough, abdominal pain, nausea, vomiting or bleeding.  Cholangiogram performed on 6.27.24 shows a patent cystic and common bile ducts. CT abd pelvis from 6.27.24 shows the cholecystomy tube in a good position without complication.  Education and reassurance given to the patient that the cholecystomy drain is functioning as expected. Should Robert Lang begin to experience any abdominal pain,nausea vomiting or signs of infection he should contact IR or present to his nearest emergency department for further evaluation. Patient verbalized understanding and is in agreement with the plan of care. Patient to follow up with IR on 7.22.24 as previously scheduled

## 2022-10-05 ENCOUNTER — Telehealth: Payer: Self-pay | Admitting: Radiology

## 2022-10-05 ENCOUNTER — Encounter (HOSPITAL_BASED_OUTPATIENT_CLINIC_OR_DEPARTMENT_OTHER): Payer: Self-pay

## 2022-10-05 ENCOUNTER — Inpatient Hospital Stay (HOSPITAL_BASED_OUTPATIENT_CLINIC_OR_DEPARTMENT_OTHER)
Admission: EM | Admit: 2022-10-05 | Discharge: 2022-10-09 | DRG: 920 | Disposition: A | Discharge status: Home Health | Payer: Medicare Other | Attending: Internal Medicine | Admitting: Internal Medicine

## 2022-10-05 ENCOUNTER — Emergency Department (HOSPITAL_BASED_OUTPATIENT_CLINIC_OR_DEPARTMENT_OTHER): Payer: Medicare Other

## 2022-10-05 ENCOUNTER — Other Ambulatory Visit: Payer: Self-pay

## 2022-10-05 ENCOUNTER — Telehealth: Payer: Self-pay | Admitting: Medical

## 2022-10-05 ENCOUNTER — Telehealth: Payer: Self-pay

## 2022-10-05 DIAGNOSIS — K828 Other specified diseases of gallbladder: Secondary | ICD-10-CM | POA: Diagnosis not present

## 2022-10-05 DIAGNOSIS — J432 Centrilobular emphysema: Secondary | ICD-10-CM

## 2022-10-05 DIAGNOSIS — K5730 Diverticulosis of large intestine without perforation or abscess without bleeding: Secondary | ICD-10-CM | POA: Diagnosis not present

## 2022-10-05 DIAGNOSIS — Z800 Family history of malignant neoplasm of digestive organs: Secondary | ICD-10-CM

## 2022-10-05 DIAGNOSIS — I252 Old myocardial infarction: Secondary | ICD-10-CM

## 2022-10-05 DIAGNOSIS — F32A Depression, unspecified: Secondary | ICD-10-CM | POA: Diagnosis present

## 2022-10-05 DIAGNOSIS — Z87891 Personal history of nicotine dependence: Secondary | ICD-10-CM

## 2022-10-05 DIAGNOSIS — I10 Essential (primary) hypertension: Secondary | ICD-10-CM | POA: Diagnosis not present

## 2022-10-05 DIAGNOSIS — D1803 Hemangioma of intra-abdominal structures: Secondary | ICD-10-CM | POA: Diagnosis not present

## 2022-10-05 DIAGNOSIS — R6510 Systemic inflammatory response syndrome (SIRS) of non-infectious origin without acute organ dysfunction: Secondary | ICD-10-CM | POA: Diagnosis present

## 2022-10-05 DIAGNOSIS — Z811 Family history of alcohol abuse and dependence: Secondary | ICD-10-CM

## 2022-10-05 DIAGNOSIS — Z955 Presence of coronary angioplasty implant and graft: Secondary | ICD-10-CM | POA: Diagnosis not present

## 2022-10-05 DIAGNOSIS — K9189 Other postprocedural complications and disorders of digestive system: Principal | ICD-10-CM | POA: Diagnosis present

## 2022-10-05 DIAGNOSIS — F411 Generalized anxiety disorder: Secondary | ICD-10-CM | POA: Diagnosis present

## 2022-10-05 DIAGNOSIS — R918 Other nonspecific abnormal finding of lung field: Secondary | ICD-10-CM | POA: Diagnosis not present

## 2022-10-05 DIAGNOSIS — Z1152 Encounter for screening for COVID-19: Secondary | ICD-10-CM

## 2022-10-05 DIAGNOSIS — Y833 Surgical operation with formation of external stoma as the cause of abnormal reaction of the patient, or of later complication, without mention of misadventure at the time of the procedure: Secondary | ICD-10-CM | POA: Diagnosis present

## 2022-10-05 DIAGNOSIS — R509 Fever, unspecified: Secondary | ICD-10-CM | POA: Diagnosis not present

## 2022-10-05 DIAGNOSIS — T85590A Other mechanical complication of bile duct prosthesis, initial encounter: Secondary | ICD-10-CM | POA: Diagnosis not present

## 2022-10-05 DIAGNOSIS — J449 Chronic obstructive pulmonary disease, unspecified: Secondary | ICD-10-CM | POA: Diagnosis present

## 2022-10-05 DIAGNOSIS — Z818 Family history of other mental and behavioral disorders: Secondary | ICD-10-CM

## 2022-10-05 DIAGNOSIS — B961 Klebsiella pneumoniae [K. pneumoniae] as the cause of diseases classified elsewhere: Secondary | ICD-10-CM | POA: Diagnosis not present

## 2022-10-05 DIAGNOSIS — J84112 Idiopathic pulmonary fibrosis: Secondary | ICD-10-CM | POA: Diagnosis present

## 2022-10-05 DIAGNOSIS — Z86711 Personal history of pulmonary embolism: Secondary | ICD-10-CM | POA: Diagnosis not present

## 2022-10-05 DIAGNOSIS — Z7901 Long term (current) use of anticoagulants: Secondary | ICD-10-CM

## 2022-10-05 DIAGNOSIS — R1011 Right upper quadrant pain: Secondary | ICD-10-CM

## 2022-10-05 DIAGNOSIS — E7800 Pure hypercholesterolemia, unspecified: Secondary | ICD-10-CM | POA: Diagnosis not present

## 2022-10-05 DIAGNOSIS — E872 Acidosis, unspecified: Secondary | ICD-10-CM | POA: Diagnosis present

## 2022-10-05 DIAGNOSIS — M7989 Other specified soft tissue disorders: Secondary | ICD-10-CM | POA: Diagnosis not present

## 2022-10-05 DIAGNOSIS — R109 Unspecified abdominal pain: Secondary | ICD-10-CM | POA: Diagnosis not present

## 2022-10-05 DIAGNOSIS — I2510 Atherosclerotic heart disease of native coronary artery without angina pectoris: Secondary | ICD-10-CM | POA: Diagnosis not present

## 2022-10-05 DIAGNOSIS — A419 Sepsis, unspecified organism: Secondary | ICD-10-CM | POA: Diagnosis not present

## 2022-10-05 DIAGNOSIS — K6811 Postprocedural retroperitoneal abscess: Secondary | ICD-10-CM | POA: Diagnosis not present

## 2022-10-05 DIAGNOSIS — Z886 Allergy status to analgesic agent status: Secondary | ICD-10-CM

## 2022-10-05 DIAGNOSIS — I480 Paroxysmal atrial fibrillation: Secondary | ICD-10-CM | POA: Diagnosis present

## 2022-10-05 DIAGNOSIS — K219 Gastro-esophageal reflux disease without esophagitis: Secondary | ICD-10-CM | POA: Diagnosis not present

## 2022-10-05 DIAGNOSIS — G4733 Obstructive sleep apnea (adult) (pediatric): Secondary | ICD-10-CM | POA: Diagnosis present

## 2022-10-05 DIAGNOSIS — I2693 Single subsegmental pulmonary embolism without acute cor pulmonale: Secondary | ICD-10-CM | POA: Diagnosis not present

## 2022-10-05 DIAGNOSIS — R000 Tachycardia, unspecified: Secondary | ICD-10-CM | POA: Diagnosis not present

## 2022-10-05 DIAGNOSIS — Z808 Family history of malignant neoplasm of other organs or systems: Secondary | ICD-10-CM

## 2022-10-05 DIAGNOSIS — Z888 Allergy status to other drugs, medicaments and biological substances status: Secondary | ICD-10-CM

## 2022-10-05 DIAGNOSIS — Z79899 Other long term (current) drug therapy: Secondary | ICD-10-CM

## 2022-10-05 DIAGNOSIS — Z8709 Personal history of other diseases of the respiratory system: Secondary | ICD-10-CM

## 2022-10-05 DIAGNOSIS — I700 Atherosclerosis of aorta: Secondary | ICD-10-CM | POA: Diagnosis not present

## 2022-10-05 LAB — URINALYSIS, W/ REFLEX TO CULTURE (INFECTION SUSPECTED)
Bilirubin Urine: NEGATIVE
Glucose, UA: NEGATIVE mg/dL
Hgb urine dipstick: NEGATIVE
Ketones, ur: NEGATIVE mg/dL
Leukocytes,Ua: NEGATIVE
Nitrite: NEGATIVE
Protein, ur: 30 mg/dL — AB
Specific Gravity, Urine: 1.025 (ref 1.005–1.030)
WBC, UA: NONE SEEN WBC/hpf (ref 0–5)
pH: 5.5 (ref 5.0–8.0)

## 2022-10-05 LAB — COMPREHENSIVE METABOLIC PANEL
ALT: 23 U/L (ref 0–44)
AST: 21 U/L (ref 15–41)
Albumin: 3.5 g/dL (ref 3.5–5.0)
Alkaline Phosphatase: 78 U/L (ref 38–126)
Anion gap: 10 (ref 5–15)
BUN: 12 mg/dL (ref 8–23)
CO2: 22 mmol/L (ref 22–32)
Calcium: 8.7 mg/dL — ABNORMAL LOW (ref 8.9–10.3)
Chloride: 101 mmol/L (ref 98–111)
Creatinine, Ser: 0.91 mg/dL (ref 0.61–1.24)
GFR, Estimated: >60 mL/min (ref >60)
Glucose, Bld: 108 mg/dL — ABNORMAL HIGH (ref 70–99)
Potassium: 4.1 mmol/L (ref 3.5–5.1)
Sodium: 133 mmol/L — ABNORMAL LOW (ref 135–145)
Total Bilirubin: 0.5 mg/dL (ref 0.3–1.2)
Total Protein: 7.4 g/dL (ref 6.5–8.1)

## 2022-10-05 LAB — CBC WITH DIFFERENTIAL/PLATELET
Abs Immature Granulocytes: 0.14 10*3/uL — ABNORMAL HIGH (ref 0.00–0.07)
Basophils Absolute: 0 10*3/uL (ref 0.0–0.1)
Basophils Relative: 0 %
Eosinophils Absolute: 0 10*3/uL (ref 0.0–0.5)
Eosinophils Relative: 0 %
HCT: 45.5 % (ref 39.0–52.0)
Hemoglobin: 15.1 g/dL (ref 13.0–17.0)
Immature Granulocytes: 1 %
Lymphocytes Relative: 10 %
Lymphs Abs: 1.3 10*3/uL (ref 0.7–4.0)
MCH: 30.6 pg (ref 26.0–34.0)
MCHC: 33.2 g/dL (ref 30.0–36.0)
MCV: 92.3 fL (ref 80.0–100.0)
Monocytes Absolute: 1.6 10*3/uL — ABNORMAL HIGH (ref 0.1–1.0)
Monocytes Relative: 12 %
Neutro Abs: 10.2 10*3/uL — ABNORMAL HIGH (ref 1.7–7.7)
Neutrophils Relative %: 77 %
Platelets: 252 10*3/uL (ref 150–400)
RBC: 4.93 MIL/uL (ref 4.22–5.81)
RDW: 14.6 % (ref 11.5–15.5)
WBC: 13.2 10*3/uL — ABNORMAL HIGH (ref 4.0–10.5)
nRBC: 0 % (ref 0.0–0.2)

## 2022-10-05 LAB — LACTIC ACID, PLASMA
Lactic Acid, Venous: 2.5 mmol/L (ref 0.5–1.9)
Lactic Acid, Venous: 3.3 mmol/L (ref 0.5–1.9)
Lactic Acid, Venous: 1.6 mmol/L (ref 0.5–1.9)

## 2022-10-05 LAB — APTT: aPTT: 36 seconds (ref 24–36)

## 2022-10-05 LAB — PROTIME-INR
INR: 1.2 (ref 0.8–1.2)
Prothrombin Time: 15.4 seconds — ABNORMAL HIGH (ref 11.4–15.2)

## 2022-10-05 LAB — RESP PANEL BY RT-PCR (RSV, FLU A&B, COVID)  RVPGX2
Influenza A by PCR: NEGATIVE
Influenza B by PCR: NEGATIVE
Resp Syncytial Virus by PCR: NEGATIVE
SARS Coronavirus 2 by RT PCR: NEGATIVE

## 2022-10-05 MED ORDER — ACETAMINOPHEN 325 MG PO TABS
650.0000 mg | ORAL_TABLET | Freq: Once | ORAL | Status: AC
  Administered 2022-10-05: 650 mg via ORAL
  Filled 2022-10-05: qty 2

## 2022-10-05 MED ORDER — ONDANSETRON HCL 4 MG/2ML IJ SOLN
4.0000 mg | Freq: Once | INTRAMUSCULAR | Status: AC
  Administered 2022-10-05: 4 mg via INTRAVENOUS
  Filled 2022-10-05: qty 2

## 2022-10-05 MED ORDER — LACTATED RINGERS IV BOLUS (SEPSIS)
1000.0000 mL | Freq: Once | INTRAVENOUS | Status: AC
  Administered 2022-10-05: 1000 mL via INTRAVENOUS

## 2022-10-05 MED ORDER — MORPHINE SULFATE (PF) 4 MG/ML IV SOLN
4.0000 mg | Freq: Once | INTRAVENOUS | Status: AC
  Administered 2022-10-05: 4 mg via INTRAVENOUS
  Filled 2022-10-05: qty 1

## 2022-10-05 MED ORDER — LACTATED RINGERS IV SOLN: INTRAVENOUS | Status: DC

## 2022-10-05 MED ORDER — MORPHINE SULFATE (PF) 4 MG/ML IV SOLN
4.0000 mg | Freq: Once | INTRAVENOUS | Status: AC | PRN
  Administered 2022-10-05: 4 mg via INTRAVENOUS
  Filled 2022-10-05: qty 1

## 2022-10-05 MED ORDER — METRONIDAZOLE 500 MG/100ML IV SOLN
500.0000 mg | Freq: Once | INTRAVENOUS | Status: AC
  Administered 2022-10-05: 500 mg via INTRAVENOUS
  Filled 2022-10-05: qty 100

## 2022-10-05 MED ORDER — SODIUM CHLORIDE 0.9 % IV SOLN
2.0000 g | Freq: Once | INTRAVENOUS | Status: AC
  Administered 2022-10-05: 2 g via INTRAVENOUS
  Filled 2022-10-05: qty 20

## 2022-10-05 MED ORDER — IOHEXOL 300 MG/ML  SOLN
100.0000 mL | Freq: Once | INTRAMUSCULAR | Status: AC | PRN
  Administered 2022-10-05: 100 mL via INTRAVENOUS

## 2022-10-05 NOTE — ED Triage Notes (Signed)
Pt arrives with c/o right sided ABD pain that started last night. Pt recently hospitalized for PNA and PE. Per family, pt ned gallbladder surgery and had drain put in to drain his gallbladder. Pt started experiencing pain after wife flushed drain last night.

## 2022-10-05 NOTE — Sepsis Progress Note (Signed)
Elink monitoring for the code sepsis protocol.  

## 2022-10-05 NOTE — ED Notes (Signed)
Per E LINK. Pt will need 3 rd lactic at 2000. EDP was notified by E Link

## 2022-10-05 NOTE — ED Notes (Signed)
Oxygen sats dropped into the 80's. Patient placed on oxygen 2 liters vial nasal canula.

## 2022-10-05 NOTE — Telephone Encounter (Signed)
72 y.o. patient with history of acute cholecystitis. Deemed not a surgical at that time due to a recent PE. s/p cholecystomy tube placement  by IR on 6.10.24. Cholangiogram performed on 6.27.24 shows a patent cystic and common bile ducts. CT abd pelvis from 6.27.24 shows the cholecystomy tube in a good position without complication.   Wife called stating that the she is flushing the drain BID however when she flushed the drain last night the patient experienced abdominal pain and has felt sick since that time. Per the wife Patient endorses chills, right sided abdominal and generalized weakness  Denies any fevers, nausea or vomiting. Wife states that there is still no output in the cholecystomy tube. Due to concern for possible tube displacement / infection recommend the patient been seen at the nearest ED for further evaluation. Wife verbalized understanding and is in agreement with plan of care.

## 2022-10-05 NOTE — Telephone Encounter (Signed)
Prior authorization initiated with [facility] for Lorazepam 0.5 mg

## 2022-10-05 NOTE — Progress Notes (Signed)
Hospitalist Transfer Note:  Transferring facility: [facility] Requesting provider: Dr. Lynelle Doctor (EDP at [facility]) Reason for transfer: admission for further evaluation and management of SIRS criteria, concern for potential worsening of recently diagnosed cholecystitis for which the patient underwent cholecystostomy tube.     72  year old male who presented to [facility] ED complaining of fever over the last 1 day.  He was admitted in June 2024 at which time he was diagnosed with acute cholecystitis, but was deemed to be a poor surgical candidate at that time, prompting placement of cholecystostomy tube.  Last night, his wife flushed the cholecystostomy tube, at which time he developed sharp abdominal discomfort, which has been persistent through today.  Subsequently, he has developed objective fever over the course of the last 12 hours, associated with chills.  Denies any associated chest pain or shortness of breath, but does endorse some generalized weakness over the last 1 to 2 days.  No report of any associated nausea, vomiting, diarrhea.  Patient also reports potential decrease in output of biliary drain over the last day.  Vital signs in the ED were notable for the following: Temperature max 102; sinus tachycardia with heart rates in the low 100s to 1 teens; initial blood pressure 99/66, which is increased at 111/91 and following 30 mL/kg ivf bolus.   Labs were notable for initial lactic acid of 2.5, with repeat trending up to 3.3.  Urinalysis not suggestive of UTI.  Chest x-ray reportedly shows no evidence of acute cardiopulmonary process.  CT abdomen/pelvis shows potential interval increase in inflammation associated with the gallbladder.  EDP d/w on-call general surgery (Dr. Derrell Lolling) who did not feel that there is a current indication for general surgery involvement, but rather recommended consultation of IR to eval the efficacy of current cholecystostomy tube.  Medications administered prior to transfer  included the following: Rocephin, IV Flagyl.   Subsequently, I accepted this patient for transfer for inpatient admission to a pcu bed at [facility] for further work-up and management of the above .       Nursing staff, Please call [facility] Admits & Consults System-Wide number on Amion [phone]) as soon as patient's arrival, so appropriate admitting provider can evaluate the pt.     Newton Pigg, DO Hospitalist

## 2022-10-05 NOTE — Sepsis Progress Note (Signed)
Notified bedside nurse of need to draw repeat lactic acid #3 at 2000.

## 2022-10-05 NOTE — ED Provider Notes (Signed)
[place] [facility] Provider Note   CSN: [id number] Arrival date & time: 10/05/22  1510     History  Chief Complaint  Patient presents with   Abdominal Pain    Robert Lang is a 72 y.o. male.  DDX includes cholangitis, sepsis, cholecystitis, SBO, pyelo   Abdominal Pain    Patient has history of hypertension hyperlipidemia coronary artery disease, neuropathy, atrial flutter, COPD, paroxysmal atrial fibrillation, idiopathic pulmonary fibrosis who has recently been in the hospital for issues with aspiration pneumonia, severe sepsis, cholecystitis and pulmonary embolism.  Patient has been in the hospital a couple times in June for these issues.  He is not deemed to be a candidate for cholecystectomy because of his other medical issues.  Patient's had a biliary drain.  Patient was in the hospital June 8 and then was also back in the hospital on June 27.  Patient had been doing well until last night when he started becoming very chilled.  He started feeling shaky and nauseated.  He had increasing pain in his right upper quadrant.  Wife states she noticed the symptoms after flushing the drain last night.  Home Medications Prior to Admission medications   Medication Sig Start Date End Date Taking? Authorizing Provider  apixaban (ELIQUIS) 5 MG TABS tablet Take 1 tablet (5 mg total) by mouth 2 (two) times daily. 09/18/22  Yes Regalado, Belkys A, MD  atorvastatin (LIPITOR) 80 MG tablet Take 1 tablet (80 mg total) by mouth daily. 04/28/22  Yes Georgeanna Lea, MD  buPROPion (WELLBUTRIN XL) 300 MG 24 hr tablet Take 1 tablet (300 mg total) by mouth daily. 08/20/22  Yes White, Arlys John A, NP  busPIRone (BUSPAR) 15 MG tablet Take 1 tablet (15 mg total) by mouth 3 (three) times daily. 08/20/22  Yes Avelina Laine A, NP  finasteride (PROSCAR) 5 MG tablet Take 1 tablet (5 mg total) by mouth daily. 03/10/21  Yes Georgeanna Lea, MD  Multiple Vitamins-Minerals  (PRESERVISION AREDS PO) Take 1 capsule by mouth in the morning and at bedtime.   Yes [provider]  Pirfenidone (ESBRIET) 801 MG TABS Take 1 tablet (801 mg total) by mouth 3 (three) times daily. 06/24/22  Yes Kalman Shan, MD  pregabalin (LYRICA) 150 MG capsule Take 1 capsule (150 mg total) by mouth 2 (two) times daily. 07/15/22  Yes Windell Norfolk, MD  ranolazine (RANEXA) 1000 MG SR tablet Take 1 tablet (1,000 mg total) by mouth 2 (two) times daily. 05/27/22  Yes Georgeanna Lea, MD  sertraline (ZOLOFT) 100 MG tablet Take 1.5 tablets (150 mg total) by mouth at bedtime. 06/28/22  Yes White, Watt Climes, NP  Cholecalciferol (VITAMIN D3) 25 MCG (1000 UT) CAPS Take 2 capsules by mouth 2 (two) times daily.    [provider]  docusate sodium (COLACE) 100 MG capsule Take 100 mg by mouth daily.    [provider]  famotidine (PEPCID) 40 MG tablet TAKE 1 TABLET(40 MG) BY MOUTH AT BEDTIME Patient taking differently: Take 40 mg by mouth at bedtime. 08/11/22   Georgeanna Lea, MD  fexofenadine (ALLEGRA) 180 MG tablet Take 180 mg by mouth at bedtime.  04/09/08   [provider]  guaiFENesin (MUCINEX) 600 MG 12 hr tablet Take 2 tablets (1,200 mg total) by mouth 2 (two) times daily. 09/18/22 10/18/22  Regalado,  Billings A, MD  lamoTRIgine (LAMICTAL) 100 MG tablet Take 1 tablet (100 mg total) by mouth daily. 09/14/22   White,  Watt Climes, NP  levalbuterol (XOPENEX) 1.25 MG/0.5ML nebulizer solution Take 1.25 mg by nebulization every 4 (four) hours as needed for wheezing or shortness of breath. 09/18/22   Regalado, Belkys A, MD  LORazepam (ATIVAN) 0.5 MG tablet Take one tablet by mouth only for severe anxiety or agitation Patient taking differently: Take 0.5 mg by mouth daily as needed for anxiety. Take one tablet by mouth only for severe anxiety or agitation. Has not taking medication yet. 08/20/22   Joan Flores, NP  Melatonin 10 MG TABS Take 1 tablet by mouth at bedtime.    [provider]  nitroGLYCERIN (NITROSTAT) 0.4 MG SL tablet Place 1 tablet (0.4 mg total) under the tongue every 5 (five) minutes as needed for chest pain. 03/10/21   Georgeanna Lea, MD  ondansetron (ZOFRAN) 4 MG tablet Take 1 tablet (4 mg total) by mouth every 8 (eight) hours as needed for nausea or vomiting. 09/08/22   [name], [name], [name]  pantoprazole (PROTONIX) 40 MG tablet TAKE 1 TABLET(40 MG) BY MOUTH DAILY Patient taking differently: Take 40 mg by mouth daily. 07/01/22   Lynann Bologna, MD  Polyethylene Glycol 3350 (MIRALAX PO) Take 17 g by mouth daily.    [provider]  Sodium Chloride Flush (NORMAL SALINE FLUSH) 0.9 % SOLN Flush drain with 5 mLs daily. Discard remaining 5ml in each syringe. 09/01/22   Burnadette Pop, MD  traZODone (DESYREL) 50 MG tablet Take 1 tablet (50 mg total) by mouth at bedtime as needed for sleep (May take 1/2 tab to 1 tablet for sleep, as needed.). 06/28/22   Joan Flores, NP  umeclidinium-vilanterol (ANORO ELLIPTA) 62.5-25 MCG/ACT AEPB Inhale 1 puff into the lungs daily. 09/02/22   Burnadette Pop, MD  vitamin B-12 (CYANOCOBALAMIN) 1000 MCG tablet Take 1,000 mcg by mouth daily.    [provider]  zolpidem (AMBIEN) 5 MG tablet Take 1 tablet (5 mg total) by mouth at bedtime as needed for sleep. 06/15/22 12/12/22  Windell Norfolk, MD      Allergies    Naproxen and Silodosin    Review of Systems   Review of Systems  Gastrointestinal:  Positive for abdominal pain.    Physical Exam Updated Vital Signs BP 103/64   Pulse (!) 113   Temp 98.5 F (36.9 C) (Oral)   Resp (!) 23   Wt 90.3 kg   SpO2 91%   BMI 27.75 kg/m  Physical Exam Vitals and nursing note reviewed.  Constitutional:      Appearance: He is well-developed. He is ill-appearing.  HENT:     Head: Normocephalic and atraumatic.     Right Ear: External ear normal.     Left Ear: External ear normal.  Eyes:     General: No scleral icterus.       Right eye: No discharge.         Left eye: No discharge.     Conjunctiva/sclera: Conjunctivae normal.  Neck:     Trachea: No tracheal deviation.  Cardiovascular:     Rate and Rhythm: Normal rate and regular rhythm.  Pulmonary:     Effort: Pulmonary effort is normal. No respiratory distress.     Breath sounds: Normal breath sounds. No stridor. No wheezing or rales.  Abdominal:     General: Bowel sounds are normal. There is no distension.     Palpations: Abdomen is soft.     Tenderness: There is abdominal tenderness in the right upper quadrant. There is no guarding  or rebound.  Musculoskeletal:        General: No tenderness or deformity.     Cervical back: Neck supple.  Skin:    General: Skin is warm and dry.     Findings: No rash.  Neurological:     General: No focal deficit present.     Mental Status: He is alert.     Cranial Nerves: No cranial nerve deficit, dysarthria or facial asymmetry.     Sensory: No sensory deficit.     Motor: No abnormal muscle tone or seizure activity.     Coordination: Coordination normal.  Psychiatric:        Mood and Affect: Mood normal.    ED Results / Procedures / Treatments   Labs (all labs ordered are listed, but only abnormal results are displayed) Labs Reviewed  LACTIC ACID, PLASMA - Abnormal; Notable for the following components:      Result Value   Lactic Acid, Venous 2.5 (*)    All other components within normal limits  LACTIC ACID, PLASMA - Abnormal; Notable for the following components:   Lactic Acid, Venous 3.3 (*)    All other components within normal limits  COMPREHENSIVE METABOLIC PANEL - Abnormal; Notable for the following components:   Sodium 133 (*)    Glucose, Bld 108 (*)    Calcium 8.7 (*)    All other components within normal limits  CBC WITH DIFFERENTIAL/PLATELET - Abnormal; Notable for the following components:   WBC 13.2 (*)    Neutro Abs 10.2 (*)    Monocytes Absolute 1.6 (*)    Abs Immature Granulocytes 0.14 (*)    All other components within  normal limits  PROTIME-INR - Abnormal; Notable for the following components:   Prothrombin Time 15.4 (*)    All other components within normal limits  URINALYSIS, W/ REFLEX TO CULTURE (INFECTION SUSPECTED) - Abnormal; Notable for the following components:   Color, Urine AMBER (*)    Protein, ur 30 (*)    Bacteria, UA RARE (*)    All other components within normal limits  CULTURE, BLOOD (ROUTINE X 2)  CULTURE, BLOOD (ROUTINE X 2)  RESP PANEL BY RT-PCR (RSV, FLU A&B, COVID)  RVPGX2  APTT  LACTIC ACID, PLASMA    EKG EKG Interpretation Date/Time:  Tuesday October 05 2022 16:20:28 EDT Ventricular Rate:  101 PR Interval:  37 QRS Duration:  122 QT Interval:  333 QTC Calculation: 432 R Axis:   -65  Text Interpretation: Sinus tachycardia Right bundle branch block Inferolateral infarct, old No significant change since last tracing Confirmed by Linwood Dibbles [phone]) on 10/05/2022 4:46:43 PM  Radiology CT ABDOMEN PELVIS W CONTRAST  Result Date: 10/05/2022 CLINICAL DATA:  Abdominal pain. EXAM: CT ABDOMEN AND PELVIS WITH CONTRAST TECHNIQUE: Multidetector CT imaging of the abdomen and pelvis was performed using the standard protocol following bolus administration of intravenous contrast. RADIATION DOSE REDUCTION: This exam was performed according to the departmental dose-optimization program which includes automated exposure control, adjustment of the mA and/or kV according to patient size and/or use of iterative reconstruction technique. CONTRAST:  OMNIPAQUE IOHEXOL 300 MG/ML  SOLN COMPARISON:  CT abdomen pelvis dated September 16, 2022. FINDINGS: Lower chest: No acute abnormality.  Bibasilar atelectasis. Hepatobiliary: Unchanged small hemangioma in the right liver. No new focal liver abnormality. Percutaneous cholecystostomy tube remains in place. Slightly increased distention of the gallbladder compared to prior study. Unchanged mild pericholecystic inflammatory change. No biliary dilatation. Pancreas:  Unremarkable. No pancreatic ductal dilatation  or surrounding inflammatory changes. Spleen: Normal in size without focal abnormality. Adrenals/Urinary Tract: Adrenal glands are unremarkable. Kidneys are normal, without renal calculi, focal lesion, or hydronephrosis. Bladder is unremarkable. Stomach/Bowel: Stomach is within normal limits. Prior appendectomy. No evidence of bowel wall thickening, distention, or inflammatory changes. Left-sided colonic diverticulosis. Vascular/Lymphatic: Aortic atherosclerosis. No enlarged abdominal or pelvic lymph nodes. Reproductive: Prostate is unremarkable. Other: No abdominal wall hernia or abnormality. No abdominopelvic ascites. No pneumoperitoneum. Musculoskeletal: No acute or significant osseous findings. IMPRESSION: 1. Percutaneous cholecystostomy tube remains in place. Slightly increased distention of the gallbladder compared to prior study with similar degree of mild pericholecystic inflammatory change. Correlate with tube function. 2.  Aortic Atherosclerosis (ICD10-I70.0). Electronically Signed   By: Obie Dredge M.D.   On: 10/05/2022 17:55   DG Chest Portable 1 View  Result Date: 10/05/2022 CLINICAL DATA:  Provided history: Infection. EXAM: PORTABLE CHEST 1 VIEW COMPARISON:  Chest CT 09/16/2022. Prior chest radiographs 09/16/2022 and earlier. FINDINGS: The cardiomediastinal silhouette is unchanged. Aortic atherosclerosis. Linear opacities within the right lung base compatible with atelectasis and/or scarring. No appreciable airspace consolidation. No evidence of pleural effusion or pneumothorax. No acute osseous abnormality identified. IMPRESSION: 1. No evidence of airspace consolidation. 2. Atelectasis and/or scarring within the right lung base. 3. Aortic Atherosclerosis (ICD10-I70.0). Electronically Signed   By: Jackey Loge D.O.   On: 10/05/2022 16:15    Procedures .Critical Care  Performed by: Linwood Dibbles, MD Authorized by: Linwood Dibbles, MD   Critical care  provider statement:    Critical care time (minutes):  35   Critical care was necessary to treat or prevent imminent or life-threatening deterioration of the following conditions:  Sepsis   Critical care was time spent personally by me on the following activities:  Development of treatment plan with patient or surrogate, discussions with consultants, evaluation of patient's response to treatment, examination of patient, ordering and review of laboratory studies, ordering and review of radiographic studies, ordering and performing treatments and interventions, pulse oximetry, re-evaluation of patient's condition and review of old charts     Medications Ordered in ED Medications  lactated ringers infusion ( Intravenous New Bag/Given 10/05/22 1838)  morphine (PF) 4 MG/ML injection 4 mg (has no administration in time range)  lactated ringers bolus 1,000 mL (0 mLs Intravenous Stopped 10/05/22 1756)    And  lactated ringers bolus 1,000 mL (0 mLs Intravenous Stopped 10/05/22 1837)    And  lactated ringers bolus 1,000 mL (0 mLs Intravenous Stopped 10/05/22 1837)  cefTRIAXone (ROCEPHIN) 2 g in sodium chloride 0.9 % 100 mL IVPB (0 g Intravenous Stopped 10/05/22 1658)  metroNIDAZOLE (FLAGYL) IVPB 500 mg (0 mg Intravenous Stopped 10/05/22 1837)  morphine (PF) 4 MG/ML injection 4 mg (4 mg Intravenous Given 10/05/22 1617)  ondansetron (ZOFRAN) injection 4 mg (4 mg Intravenous Given 10/05/22 1616)  acetaminophen (TYLENOL) tablet 650 mg (650 mg Oral Given 10/05/22 1618)  iohexol (OMNIPAQUE) 300 MG/ML solution 100 mL (100 mLs Intravenous Contrast Given 10/05/22 1715)  morphine (PF) 4 MG/ML injection 4 mg (4 mg Intravenous Given 10/05/22 1803)    ED Course/ Medical Decision Making/ A&P Clinical Course as of 10/05/22 2100  Tue Oct 05, 2022  1653 CBC shows leukocytosis.  Metabolic panel without significant abnormalities. [JK]  1654 Chest x-ray without acute process. [JK]  1755 Lactic acid elevated.   [JK]  1755 Covid  flu negative urinalysis not suggestive of infection [JK]  1842 CT scan shows slightly increased distention of the gallbladder compared  to the prior study with similar degree of mild pericholecystic inflammatory change.  Family has noted decreased output from the drainage bag recently [JK]  1847 DIscussed with Dr Derrell Lolling.  SUggests IR consultation regarding the drain.  Medical admission recommended [JK]    Clinical Course User Index [JK] Linwood Dibbles, MD                             Medical Decision Making Problems Addressed: Fever, unspecified fever cause: acute illness or injury that poses a threat to life or bodily functions Right upper quadrant abdominal pain: complicated acute illness or injury with systemic symptoms that poses a threat to life or bodily functions  Amount and/or Complexity of Data Reviewed Labs: ordered. Decision-making details documented in ED Course. Radiology: ordered and independent interpretation performed. ECG/medicine tests: ordered.  Risk OTC drugs. Prescription drug management. Decision regarding hospitalization.   Patient presented to the ER for recurrent issues with abdominal pain and fever.  Patient has had complicated history of cholecystitis that was complicated by sepsis pulmonary embolism.  Patient not a surgical candidate for that region.  Patient was treated with a biliary drain.  Wife has noticed decreased drainage recently.  Patient experienced fever and increasing pain after flushing the drain recently.  Patient noted to have a fever of 102.  Symptoms concerning for the possibility of cholangitis, complicated sick cholecystitis.  Patient noted to have elevated lactic acid level and persistent tachycardia although blood pressure has remained stable.  Patient was treated per sepsis protocol with fluids IV antibiotics.  CT scan does show persistent inflammation in the gallbladder region.  Concerned about the possibly of cholangitis as well with his fever.   Discussed the case with Dr. Derrell Lolling general surgery.  No indication for surgery at this time.  Will consult the medical service for admission.  Dr. Derrell Lolling recommends consultation with IR regarding the patient's drain to assess if it is functioning properly.  Case discussed with Dr Arlean Hopping        Final Clinical Impression(s) / ED Diagnoses Final diagnoses:  Right upper quadrant abdominal pain  Fever, unspecified fever cause    Rx / DC Orders ED Discharge Orders     None         Linwood Dibbles, MD 10/05/22 2100

## 2022-10-05 NOTE — Sepsis Progress Note (Signed)
Notified provider of need to order repeat lactic acid (#3). 

## 2022-10-05 NOTE — ED Notes (Signed)
Spoke to [facility] for transport at 7:50 pm.

## 2022-10-05 NOTE — Telephone Encounter (Signed)
Sherry with [facility] called to advise that wife requested saline solution to flush out pt's coley bag but they do not have an order. Order can be faxed to [phone] or she can be reached at [phone].

## 2022-10-06 ENCOUNTER — Encounter (HOSPITAL_COMMUNITY): Payer: Self-pay | Admitting: Family Medicine

## 2022-10-06 ENCOUNTER — Inpatient Hospital Stay (HOSPITAL_COMMUNITY): Payer: Medicare Other

## 2022-10-06 DIAGNOSIS — Z86711 Personal history of pulmonary embolism: Secondary | ICD-10-CM

## 2022-10-06 DIAGNOSIS — I2510 Atherosclerotic heart disease of native coronary artery without angina pectoris: Secondary | ICD-10-CM | POA: Diagnosis not present

## 2022-10-06 HISTORY — PX: IR EXCHANGE BILIARY DRAIN: IMG6046

## 2022-10-06 HISTORY — PX: IR CATHETER TUBE CHANGE: IMG717

## 2022-10-06 LAB — MAGNESIUM: Magnesium: 1.6 mg/dL — ABNORMAL LOW (ref 1.7–2.4)

## 2022-10-06 LAB — COMPREHENSIVE METABOLIC PANEL
ALT: 23 U/L (ref 0–44)
AST: 21 U/L (ref 15–41)
Albumin: 2.5 g/dL — ABNORMAL LOW (ref 3.5–5.0)
Alkaline Phosphatase: 62 U/L (ref 38–126)
Anion gap: 10 (ref 5–15)
BUN: 11 mg/dL (ref 8–23)
CO2: 21 mmol/L — ABNORMAL LOW (ref 22–32)
Calcium: 8 mg/dL — ABNORMAL LOW (ref 8.9–10.3)
Chloride: 103 mmol/L (ref 98–111)
Creatinine, Ser: 0.91 mg/dL (ref 0.61–1.24)
GFR, Estimated: >60 mL/min (ref >60)
Glucose, Bld: 104 mg/dL — ABNORMAL HIGH (ref 70–99)
Potassium: 4.3 mmol/L (ref 3.5–5.1)
Sodium: 134 mmol/L — ABNORMAL LOW (ref 135–145)
Total Bilirubin: 0.5 mg/dL (ref 0.3–1.2)
Total Protein: 5.6 g/dL — ABNORMAL LOW (ref 6.5–8.1)

## 2022-10-06 LAB — PROCALCITONIN: Procalcitonin: 0.2 ng/mL

## 2022-10-06 LAB — BLOOD CULTURE ID PANEL (REFLEXED) - BCID2

## 2022-10-06 LAB — TYPE AND SCREEN
ABO/RH(D): A POS
Antibody Screen: NEGATIVE

## 2022-10-06 LAB — PROTIME-INR
INR: 1.4 — ABNORMAL HIGH (ref 0.8–1.2)
Prothrombin Time: 17.6 seconds — ABNORMAL HIGH (ref 11.4–15.2)

## 2022-10-06 LAB — C-REACTIVE PROTEIN: CRP: 8.4 mg/dL — ABNORMAL HIGH (ref <1.0)

## 2022-10-06 LAB — CBC
HCT: 37 % — ABNORMAL LOW (ref 39.0–52.0)
Hemoglobin: 12.1 g/dL — ABNORMAL LOW (ref 13.0–17.0)
MCH: 30.6 pg (ref 26.0–34.0)
MCHC: 32.7 g/dL (ref 30.0–36.0)
MCV: 93.4 fL (ref 80.0–100.0)
Platelets: 171 10*3/uL (ref 150–400)
RBC: 3.96 MIL/uL — ABNORMAL LOW (ref 4.22–5.81)
RDW: 14.7 % (ref 11.5–15.5)
WBC: 9.9 10*3/uL (ref 4.0–10.5)
nRBC: 0 % (ref 0.0–0.2)

## 2022-10-06 LAB — ABO/RH: ABO/RH(D): A POS

## 2022-10-06 MED ORDER — ONDANSETRON HCL 4 MG/2ML IJ SOLN
4.0000 mg | Freq: Four times a day (QID) | INTRAMUSCULAR | Status: DC | PRN
  Administered 2022-10-06 – 2022-10-07 (×3): 4 mg via INTRAVENOUS
  Filled 2022-10-06 (×4): qty 2

## 2022-10-06 MED ORDER — TRAZODONE HCL 50 MG PO TABS: 50.0000 mg | ORAL_TABLET | Freq: Every evening | ORAL | Status: DC | PRN

## 2022-10-06 MED ORDER — HYDROMORPHONE HCL 1 MG/ML IJ SOLN
1.0000 mg | INTRAMUSCULAR | Status: AC | PRN
  Administered 2022-10-06: 1 mg via INTRAVENOUS
  Filled 2022-10-06: qty 1

## 2022-10-06 MED ORDER — IOHEXOL 300 MG/ML  SOLN
50.0000 mL | Freq: Once | INTRAMUSCULAR | Status: AC | PRN
  Administered 2022-10-06: 5 mL

## 2022-10-06 MED ORDER — PREGABALIN 75 MG PO CAPS
150.0000 mg | ORAL_CAPSULE | Freq: Two times a day (BID) | ORAL | Status: DC
  Administered 2022-10-06 – 2022-10-09 (×8): 150 mg via ORAL
  Filled 2022-10-06 (×8): qty 2

## 2022-10-06 MED ORDER — ENOXAPARIN SODIUM 100 MG/ML IJ SOSY
1.0000 mg/kg | PREFILLED_SYRINGE | Freq: Two times a day (BID) | INTRAMUSCULAR | Status: DC
  Administered 2022-10-06 – 2022-10-07 (×3): 90 mg via SUBCUTANEOUS
  Filled 2022-10-06 (×3): qty 0.9

## 2022-10-06 MED ORDER — ATORVASTATIN CALCIUM 80 MG PO TABS
80.0000 mg | ORAL_TABLET | Freq: Every day | ORAL | Status: DC
  Administered 2022-10-06 – 2022-10-09 (×4): 80 mg via ORAL
  Filled 2022-10-06 (×4): qty 1

## 2022-10-06 MED ORDER — LORAZEPAM 0.5 MG PO TABS
0.5000 mg | ORAL_TABLET | Freq: Every day | ORAL | Status: DC | PRN
  Administered 2022-10-07: 0.5 mg via ORAL
  Filled 2022-10-06: qty 1

## 2022-10-06 MED ORDER — BUPROPION HCL ER (XL) 150 MG PO TB24
300.0000 mg | ORAL_TABLET | Freq: Every day | ORAL | Status: DC
  Administered 2022-10-06 – 2022-10-09 (×4): 300 mg via ORAL
  Filled 2022-10-06 (×4): qty 2

## 2022-10-06 MED ORDER — FENTANYL CITRATE PF 50 MCG/ML IJ SOSY
12.5000 ug | PREFILLED_SYRINGE | INTRAMUSCULAR | Status: DC | PRN
  Administered 2022-10-06: 50 ug via INTRAVENOUS
  Filled 2022-10-06: qty 1

## 2022-10-06 MED ORDER — SODIUM CHLORIDE 0.9 % IV SOLN
2.0000 g | INTRAVENOUS | Status: DC
  Administered 2022-10-06 – 2022-10-08 (×3): 2 g via INTRAVENOUS
  Filled 2022-10-06 (×3): qty 20

## 2022-10-06 MED ORDER — SERTRALINE HCL 50 MG PO TABS
150.0000 mg | ORAL_TABLET | Freq: Every day | ORAL | Status: DC
  Administered 2022-10-06 – 2022-10-08 (×4): 150 mg via ORAL
  Filled 2022-10-06 (×4): qty 1

## 2022-10-06 MED ORDER — OXYCODONE HCL 5 MG PO TABS
5.0000 mg | ORAL_TABLET | ORAL | Status: DC | PRN
  Administered 2022-10-06 (×2): 5 mg via ORAL
  Filled 2022-10-06 (×2): qty 1

## 2022-10-06 MED ORDER — METRONIDAZOLE 500 MG/100ML IV SOLN
500.0000 mg | Freq: Two times a day (BID) | INTRAVENOUS | Status: DC
  Administered 2022-10-06 – 2022-10-07 (×3): 500 mg via INTRAVENOUS
  Filled 2022-10-06 (×3): qty 100

## 2022-10-06 MED ORDER — FENTANYL CITRATE (PF) 100 MCG/2ML IJ SOLN
INTRAMUSCULAR | Status: AC | PRN
  Administered 2022-10-06 (×2): 50 ug via INTRAVENOUS

## 2022-10-06 MED ORDER — MIDAZOLAM HCL 2 MG/2ML IJ SOLN
INTRAMUSCULAR | Status: AC | PRN
  Administered 2022-10-06: 1 mg via INTRAVENOUS

## 2022-10-06 MED ORDER — ACETAMINOPHEN 650 MG RE SUPP: 650.0000 mg | Freq: Four times a day (QID) | RECTAL | Status: DC | PRN

## 2022-10-06 MED ORDER — UMECLIDINIUM-VILANTEROL 62.5-25 MCG/ACT IN AEPB
1.0000 | INHALATION_SPRAY | Freq: Every day | RESPIRATORY_TRACT | Status: DC
  Administered 2022-10-06 – 2022-10-09 (×4): 1 via RESPIRATORY_TRACT
  Filled 2022-10-06: qty 14

## 2022-10-06 MED ORDER — PIRFENIDONE 801 MG PO TABS: 801.0000 mg | ORAL_TABLET | Freq: Three times a day (TID) | ORAL | Status: DC

## 2022-10-06 MED ORDER — APIXABAN 5 MG PO TABS
5.0000 mg | ORAL_TABLET | Freq: Two times a day (BID) | ORAL | Status: DC
  Administered 2022-10-06: 5 mg via ORAL
  Filled 2022-10-06: qty 1

## 2022-10-06 MED ORDER — BUSPIRONE HCL 5 MG PO TABS
15.0000 mg | ORAL_TABLET | Freq: Three times a day (TID) | ORAL | Status: DC
  Administered 2022-10-06 – 2022-10-09 (×10): 15 mg via ORAL
  Filled 2022-10-06 (×10): qty 3

## 2022-10-06 MED ORDER — LACTATED RINGERS IV SOLN: INTRAVENOUS | Status: AC

## 2022-10-06 MED ORDER — ACETAMINOPHEN 325 MG PO TABS: 650.0000 mg | ORAL_TABLET | Freq: Four times a day (QID) | ORAL | Status: DC | PRN

## 2022-10-06 MED ORDER — SODIUM CHLORIDE 0.9% FLUSH
3.0000 mL | Freq: Two times a day (BID) | INTRAVENOUS | Status: DC
  Administered 2022-10-06 – 2022-10-09 (×7): 3 mL via INTRAVENOUS

## 2022-10-06 MED ORDER — LAMOTRIGINE 100 MG PO TABS
100.0000 mg | ORAL_TABLET | Freq: Every day | ORAL | Status: DC
  Administered 2022-10-06 – 2022-10-09 (×4): 100 mg via ORAL
  Filled 2022-10-06 (×4): qty 1

## 2022-10-06 MED ORDER — FENTANYL CITRATE (PF) 100 MCG/2ML IJ SOLN
INTRAMUSCULAR | Status: AC
  Filled 2022-10-06: qty 2

## 2022-10-06 MED ORDER — HYDROMORPHONE HCL 1 MG/ML IJ SOLN
0.5000 mg | Freq: Once | INTRAMUSCULAR | Status: AC | PRN
  Administered 2022-10-06: 0.5 mg via INTRAVENOUS
  Filled 2022-10-06: qty 0.5

## 2022-10-06 MED ORDER — MIDAZOLAM HCL 2 MG/2ML IJ SOLN
INTRAMUSCULAR | Status: AC
  Filled 2022-10-06: qty 2

## 2022-10-06 MED ORDER — ONDANSETRON HCL 4 MG PO TABS
4.0000 mg | ORAL_TABLET | Freq: Four times a day (QID) | ORAL | Status: DC | PRN
  Filled 2022-10-06: qty 1

## 2022-10-06 MED ORDER — SODIUM CHLORIDE 0.9 % IV SOLN
2.0000 g | Freq: Three times a day (TID) | INTRAVENOUS | Status: DC
  Administered 2022-10-06: 2 g via INTRAVENOUS
  Filled 2022-10-06: qty 12.5

## 2022-10-06 MED ORDER — FINASTERIDE 5 MG PO TABS
5.0000 mg | ORAL_TABLET | Freq: Every day | ORAL | Status: DC
  Administered 2022-10-06 – 2022-10-09 (×4): 5 mg via ORAL
  Filled 2022-10-06 (×4): qty 1

## 2022-10-06 MED ORDER — LIDOCAINE HCL 1 % IJ SOLN
INTRAMUSCULAR | Status: AC
  Filled 2022-10-06: qty 20

## 2022-10-06 MED ORDER — LACTATED RINGERS IV SOLN: INTRAVENOUS | Status: DC

## 2022-10-06 MED ORDER — RANOLAZINE ER 500 MG PO TB12
1000.0000 mg | ORAL_TABLET | Freq: Two times a day (BID) | ORAL | Status: DC
  Administered 2022-10-06 – 2022-10-09 (×7): 1000 mg via ORAL
  Filled 2022-10-06 (×7): qty 2

## 2022-10-06 MED ORDER — PANTOPRAZOLE SODIUM 40 MG PO TBEC
40.0000 mg | DELAYED_RELEASE_TABLET | Freq: Every day | ORAL | Status: DC
  Administered 2022-10-06 – 2022-10-09 (×4): 40 mg via ORAL
  Filled 2022-10-06 (×4): qty 1

## 2022-10-06 MED ORDER — SENNOSIDES-DOCUSATE SODIUM 8.6-50 MG PO TABS: 1.0000 | ORAL_TABLET | Freq: Every evening | ORAL | Status: DC | PRN

## 2022-10-06 NOTE — Telephone Encounter (Signed)
Pt was admitted yesterday due to decrease output into chole tube

## 2022-10-06 NOTE — Telephone Encounter (Signed)
[employer] [place]  approved Lorazepam  until 10/05/2023

## 2022-10-06 NOTE — Progress Notes (Addendum)
PROGRESS NOTE                                                                                                                                                                                                             Patient Demographics:    Robert Lang, is a 72 y.o. male, DOB - 1950-04-14, WUJ:[id number]  Outpatient Primary MD for the patient is [name], [name]    LOS - 1  Admit date - 10/05/2022    Chief Complaint  Patient presents with   Abdominal Pain       Brief Narrative (HPI from H&P)   72 y.o. male with medical history significant for IPF, COPD, OSA, CAD, hypertension, depression, anxiety, PAF on Eliquis, history of PE, and cholecystitis status post cholecystostomy tube placement 1st week of June, it stopped draining around the June 10th was re hospitalized and tube was revised, the last few days drain stopped draining again with + pain & fevers - came to the ER with CT showing GB distention admitted for drain malfunction.   Subjective:    [name] today has, No headache, No chest pain,+ve RUQ abdominal pain - No Nausea, No new weakness tingling or numbness, no SOB   Assessment  & Plan :    1. Sepsis; right abdominal pain, decreased cholecystostomy tube output - appears to have Klebsiella bacteremia, currently on combination of IV Rocephin and Flagyl, as above he has had cholecystitis for close to 5 weeks, he underwent cholecystostomy drain placement first week of June since then this is the second malfunction.  IR has been consulted and so has general surgery.  Continue IV fluids, pain control and monitor closely.   2. COPD; IPF; OSA  - Not in exacerbation on admission  - Continue ICS-LAMA-LABA, pefinidone, and CPAP at bedtime , on 2 lits o2 for now in daytime.   3. PAF  - Continue Eliquis >> Lovenox, last Eliquis was 10/06/2022 at 1 AM.   4. CAD  - No anginal symptoms  - Continue  Lipitor, Ranexa     5. Depression, anxiety  - Continue home regimen    6.  Recent HX of PE.  For now on Eliquis >> Lovenox.        Condition - Extremely Guarded  Family Communication  : Wife [name] [phone]  on 10/06/2022 at 10:43  AM  Code Status : Full code  Consults  : IR  PUD Prophylaxis : PPI   Procedures  :     IR consulted for cholecystostomy drain revision on 10/06/2022.    CT - 1. Percutaneous cholecystostomy tube remains in place. Slightly increased distention of the gallbladder compared to prior study with similar degree of mild pericholecystic inflammatory change. Correlate with tube function. 2.  Aortic Atherosclerosis      Disposition Plan  :    Status is: Inpatient  DVT Prophylaxis  :     Lab Results  Component Value Date   PLT 171 10/06/2022    Diet :  Diet Order             Diet NPO time specified Except for: Ice Chips, Sips with Meds  Diet effective now                    Inpatient Medications  Scheduled Meds:  atorvastatin  80 mg Oral Daily   buPROPion  300 mg Oral Daily   busPIRone  15 mg Oral TID   enoxaparin (LOVENOX) injection  1 mg/kg Subcutaneous Q12H   finasteride  5 mg Oral Daily   lamoTRIgine  100 mg Oral Daily   pantoprazole  40 mg Oral Daily   Pirfenidone  801 mg Oral TID   pregabalin  150 mg Oral BID   ranolazine  1,000 mg Oral BID   sertraline  150 mg Oral QHS   sodium chloride flush  3 mL Intravenous Q12H   umeclidinium-vilanterol  1 puff Inhalation Daily   Continuous Infusions:  cefTRIAXone (ROCEPHIN)  IV 2 g (10/06/22 0956)   lactated ringers 100 mL/hr at 10/06/22 1004   metronidazole 500 mg (10/06/22 0435)   PRN Meds:.acetaminophen **OR** acetaminophen, LORazepam, ondansetron **OR** ondansetron (ZOFRAN) IV, oxyCODONE, senna-docusate, traZODone    Objective:   Vitals:   10/06/22 0905 10/06/22 0910 10/06/22 0915 10/06/22 0928  BP: 110/63 107/64 105/63 106/60  Pulse: (!) 103 (!) 104 (!) 103 (!) 104   Resp: 17 20 20 19   Temp:      TempSrc:      SpO2: 95% 95% 93% 94%  Weight:        Wt Readings from Last 3 Encounters:  10/05/22 90.3 kg  09/18/22 90.3 kg  09/16/22 89.7 kg     Intake/Output Summary (Last 24 hours) at 10/06/2022 1041 Last data filed at 10/06/2022 0739 Gross per 24 hour  Intake 4863.61 ml  Output 950 ml  Net 3913.61 ml     Physical Exam  Awake Alert, No new F.N deficits, Normal affect [place].AT,PERRAL Supple Neck, No JVD,   Symmetrical Chest wall movement, Good air movement bilaterally, CTAB RRR,No Gallops,Rubs or new Murmurs,  +ve B.Sounds, Abd Soft, right upper quadrant tenderness, right upper quadrant cholecystostomy drain in place No Cyanosis, Clubbing or edema       Data Review:    Recent Labs  Lab 10/05/22 1538 10/06/22 0524  WBC 13.2* 9.9  HGB 15.1 12.1*  HCT 45.5 37.0*  PLT 252 171  MCV 92.3 93.4  MCH 30.6 30.6  MCHC 33.2 32.7  RDW 14.6 14.7  LYMPHSABS 1.3  --   MONOABS 1.6*  --   EOSABS 0.0  --   BASOSABS 0.0  --     Recent Labs  Lab 10/05/22 1538 10/05/22 1551 10/05/22 1811 10/05/22 1958 10/06/22 0520 10/06/22 0524  NA 133*  --   --   --   --  134*  K 4.1  --   --   --   --  4.3  CL 101  --   --   --   --  103  CO2 22  --   --   --   --  21*  ANIONGAP 10  --   --   --   --  10  GLUCOSE 108*  --   --   --   --  104*  BUN 12  --   --   --   --  11  CREATININE 0.91  --   --   --   --  0.91  AST 21  --   --   --   --  21  ALT 23  --   --   --   --  23  ALKPHOS 78  --   --   --   --  62  BILITOT 0.5  --   --   --   --  0.5  ALBUMIN 3.5  --   --   --   --  2.5*  CRP  --   --   --   --  8.4*  --   PROCALCITON  --   --   --   --   --  0.20  LATICACIDVEN 2.5*  --  3.3* 1.6  --   --   INR  --  1.2  --   --   --   --   MG  --   --   --   --   --  1.6*  CALCIUM 8.7*  --   --   --   --  8.0*      Recent Labs  Lab 10/05/22 1538 10/05/22 1551 10/05/22 1811 10/05/22 1958 10/06/22 0520 10/06/22 0524  CRP  --   --   --    --  8.4*  --   PROCALCITON  --   --   --   --   --  0.20  LATICACIDVEN 2.5*  --  3.3* 1.6  --   --   INR  --  1.2  --   --   --   --   MG  --   --   --   --   --  1.6*  CALCIUM 8.7*  --   --   --   --  8.0*     Radiology Reports CT ABDOMEN PELVIS W CONTRAST  Result Date: 10/05/2022 CLINICAL DATA:  Abdominal pain. EXAM: CT ABDOMEN AND PELVIS WITH CONTRAST TECHNIQUE: Multidetector CT imaging of the abdomen and pelvis was performed using the standard protocol following bolus administration of intravenous contrast. RADIATION DOSE REDUCTION: This exam was performed according to the departmental dose-optimization program which includes automated exposure control, adjustment of the mA and/or kV according to patient size and/or use of iterative reconstruction technique. CONTRAST:  OMNIPAQUE IOHEXOL 300 MG/ML  SOLN COMPARISON:  CT abdomen pelvis dated September 16, 2022. FINDINGS: Lower chest: No acute abnormality.  Bibasilar atelectasis. Hepatobiliary: Unchanged small hemangioma in the right liver. No new focal liver abnormality. Percutaneous cholecystostomy tube remains in place. Slightly increased distention of the gallbladder compared to prior study. Unchanged mild pericholecystic inflammatory change. No biliary dilatation. Pancreas: Unremarkable. No pancreatic ductal dilatation or surrounding inflammatory changes. Spleen: Normal in size without focal abnormality. Adrenals/Urinary Tract: Adrenal glands are unremarkable. Kidneys are normal, without renal calculi, focal lesion, or hydronephrosis. Bladder is  unremarkable. Stomach/Bowel: Stomach is within normal limits. Prior appendectomy. No evidence of bowel wall thickening, distention, or inflammatory changes. Left-sided colonic diverticulosis. Vascular/Lymphatic: Aortic atherosclerosis. No enlarged abdominal or pelvic lymph nodes. Reproductive: Prostate is unremarkable. Other: No abdominal wall hernia or abnormality. No abdominopelvic ascites. No  pneumoperitoneum. Musculoskeletal: No acute or significant osseous findings. IMPRESSION: 1. Percutaneous cholecystostomy tube remains in place. Slightly increased distention of the gallbladder compared to prior study with similar degree of mild pericholecystic inflammatory change. Correlate with tube function. 2.  Aortic Atherosclerosis (ICD10-I70.0). Electronically Signed   By: Obie Dredge M.D.   On: 10/05/2022 17:55   DG Chest Portable 1 View  Result Date: 10/05/2022 CLINICAL DATA:  Provided history: Infection. EXAM: PORTABLE CHEST 1 VIEW COMPARISON:  Chest CT 09/16/2022. Prior chest radiographs 09/16/2022 and earlier. FINDINGS: The cardiomediastinal silhouette is unchanged. Aortic atherosclerosis. Linear opacities within the right lung base compatible with atelectasis and/or scarring. No appreciable airspace consolidation. No evidence of pleural effusion or pneumothorax. No acute osseous abnormality identified. IMPRESSION: 1. No evidence of airspace consolidation. 2. Atelectasis and/or scarring within the right lung base. 3. Aortic Atherosclerosis (ICD10-I70.0). Electronically Signed   By: Jackey Loge D.O.   On: 10/05/2022 16:15      Signature  -   Susa Raring M.D on 10/06/2022 at 10:41 AM   -  To page go to [email or web address]

## 2022-10-06 NOTE — TOC Initial Note (Signed)
Transition of Care [facility]) - Initial/Assessment Note    Patient Details  Name: Robert Lang MRN: [id number] Date of Birth: [birth year]  [facility]) CM/SW Contact:    Gordy Clement, RN Phone Number: 10/06/2022, 3:53 PM  Clinical Narrative:      Met patient bedside to complete initial assessment. Patient is from home with Wife. Home Health OT and PT have been recommended  Patient has previously used [facility] and would like to use again.  Liaison has been notified and they will accept patient back . No DME is being recommended. TOC will continue to follow patient for any additional discharge needs                  Expected Discharge Plan: Home w Home Health Services Barriers to Discharge: Continued Medical Work up   Patient Goals and CMS Choice Patient states their goals for this hospitalization and ongoing recovery are:: to go home CMS Medicare.gov Compare Post Acute Care list provided to:: Patient (wants [facility]) Choice offered to / list presented to : Patient      Expected Discharge Plan and Services   Discharge Planning Services: CM Consult Post Acute Care Choice: Home Health Living arrangements for the past 2 months: Single Family Home                           HH Arranged: OT, PT HH Agency: [facility]) Date HH Agency Contacted: 10/06/22 Time HH Agency Contacted: 1551 Representative spoke with at [facility] Agency: Morrie Sheldon  Prior Living Arrangements/Services Living arrangements for the past 2 months: Single Family Home Lives with:: Spouse Patient language and need for interpreter reviewed:: Yes Do you feel safe going back to the place where you live?: Yes      Need for Family Participation in Patient Care: Yes (Comment) Care giver support system in place?: Yes (comment)   Criminal Activity/Legal Involvement Pertinent to Current Situation/Hospitalization: No - Comment as needed  Activities of Daily Living      Permission  Sought/Granted Permission sought to share information with : Case Manager, Other (comment) (HH Agency) Permission granted to share information with : Yes, Verbal Permission Granted  Share Information with NAME: Case Manager  Permission granted to share info w AGENCY: [facility]        Emotional Assessment Appearance:: Appears older than stated age Attitude/Demeanor/Rapport: Gracious Affect (typically observed): Restless (pain mentioned- RN notified) Orientation: : Oriented to Self, Oriented to Place, Oriented to  Time, Oriented to Situation Alcohol / Substance Use: Not Applicable Psych Involvement: No (comment)  Admission diagnosis:  SIRS (systemic inflammatory response syndrome) (HCC) [R65.10] Right upper quadrant abdominal pain [R10.11] Fever, unspecified fever cause [R50.9] Patient Active Problem List   Diagnosis Date Noted   Sepsis (HCC) 10/05/2022   History of COPD 09/17/2022   History of pulmonary embolism 09/17/2022   History of cholecystitis 09/17/2022   GAD (generalized anxiety disorder) 09/17/2022   Allergic rhinitis 09/17/2022   BPH (benign prostatic hyperplasia) 09/17/2022   Acute respiratory failure with hypoxia (HCC) 09/16/2022   Aspiration pneumonia of both lower lobes (HCC) 09/16/2022   Single subsegmental pulmonary embolism without acute cor pulmonale (HCC) 08/31/2022   COPD exacerbation (HCC) 08/31/2022   COPD with exacerbation (HCC) 08/29/2022   Acute respiratory failure with hypoxemia (HCC) 08/28/2022   Centrilobular emphysema (HCC) 09/03/2021   Acute bacterial rhinosinusitis 09/03/2021   Hospital discharge follow-up 09/03/2021   Cognitive changes 09/03/2021  Recurrent syncope 08/27/2021   Acute metabolic encephalopathy 08/27/2021   Lactic acidosis 08/27/2021   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/27/2021   History of COVID-19 08/27/2021   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   Appendicitis 01/15/2021   Alcohol abuse 01/15/2021   OSA (obstructive sleep  apnea) 01/15/2021   Severe sepsis (HCC) 01/15/2021   IPF (idiopathic pulmonary fibrosis) (HCC) 07/28/2020   Snoring 05/13/2020   Kidney stones    Hypertension    Hyperlipidemia    History of colon polyps    Heart attack (HCC)    GERD (gastroesophageal reflux disease)    Depression    CAD (coronary artery disease)    Paroxysmal atrial fibrillation (HCC)    Arthritis    Anxiety    Allergy    Smoker 01/11/2019   ETOH abuse 01/11/2019   Acute ST elevation myocardial infarction (STEMI) of inferolateral wall (HCC) 01/10/2019   Dyspnea on exertion 10/20/2018   Status post ablation of atrial flutter 07/06/2018   Coronary artery disease 07/06/2018   Essential hypertension 07/06/2018   Dyslipidemia, goal LDL below 70 07/06/2018   Smoking 07/06/2018   Neuropathy 07/06/2018   Dizziness 10/28/2017   Falls 10/28/2017   Heart palpitations 01/27/2017   Anticoagulated 07/01/2016   H/O amiodarone therapy 07/01/2016   PLMD (periodic limb movement disorder) 11/04/2015   Obstructive sleep apnea 08/05/2015   Atrial flutter (HCC) 06/26/2015   Chest pain 06/26/2015   COPD (chronic obstructive pulmonary disease) (HCC) 06/26/2015   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 - [place], [place] - [address] AT [facility] [address] [place] [address] Phone: [phone] Fax: [phone]  [facility] [address] [place] [address] Phone: [phone] Fax: [phone]  [facility] - [place], [place] - [address] [place] [address] Phone:  [phone] Fax: [phone]     Social Determinants of Health (SDOH) Social History: SDOH Screenings   Food Insecurity: No Food Insecurity (09/21/2022)  Housing: Low Risk  (09/18/2022)  Transportation Needs: No Transportation Needs (09/21/2022)  Utilities: Not At Risk (09/18/2022)  Alcohol Screen: Low Risk  (11/25/2020)  Depression (PHQ2-9): Low Risk  (07/09/2022)  Financial Resource Strain: Low Risk  (12/22/2021)  Physical Activity: Inactive (12/22/2021)  Social Connections: Moderately Integrated (12/22/2021)  Stress: No Stress Concern Present (12/22/2021)  Tobacco Use: Medium Risk (10/06/2022)   SDOH Interventions:     Readmission Risk Interventions     No data to display

## 2022-10-06 NOTE — Telephone Encounter (Signed)
Prior Approval received for LORAZEPAM 0.5 MG with [employer] [place] effective 10/05/2022-10/05/2023

## 2022-10-06 NOTE — Progress Notes (Signed)
Patient declined CPAP. States he wears one at home but doesn't want a hospital unit while here. No unit in room at this time.

## 2022-10-06 NOTE — Progress Notes (Addendum)
ANTICOAGULATION CONSULT NOTE - Initial Consult  Pharmacy Consult for Enoxaparin Indication: atrial fibrillation  Allergies  Allergen Reactions   Naproxen Other (See Comments)    (Naprosyn *ANALGESICS - ANTI-INFLAMMATORY*) Nausea, Abdominal pain   Silodosin Other (See Comments)    Low blood pressure    Patient Measurements: Weight: 90.3 kg (199 lb)   Vital Signs: Temp: 99.5 F (37.5 C) (07/17 0739) Temp Source: Oral (07/17 0739) BP: 106/60 (07/17 0928) Pulse Rate: 104 (07/17 0928)  Labs: Recent Labs    10/05/22 1538 10/05/22 1551 10/06/22 0524  HGB 15.1  --  12.1*  HCT 45.5  --  37.0*  PLT 252  --  171  APTT  --  36  --   LABPROT  --  15.4*  --   INR  --  1.2  --   CREATININE 0.91  --  0.91    Estimated Creatinine Clearance: 78.2 mL/min (by C-G formula based on SCr of 0.91 mg/dL).   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 M75.00)   Allergic rhinitis due to pollen 09/03/2013   J30.1)  Formatting of this note might be different from the original. J30.1)   Allergy    Anticoagulated 07/01/2016   Anxiety    Arthritis    Atrial fibrillation (HCC)    Atrial flutter (HCC) 06/26/2015   CAD (coronary artery disease)    Chest pain 06/26/2015   COPD (chronic obstructive pulmonary disease) (HCC)    Coronary artery disease 07/06/2018   Cardiac catheterization 2017 showing 90% small diagonal branch disease   DDD (degenerative disc disease), cervical 09/03/2013   M50.90)  Formatting of this note might be different from the original. M50.90)   Depression    Depression    Depressive disorder 09/03/2013   Dizziness 10/28/2017   Dyslipidemia, goal LDL below 70 07/06/2018   Dyspnea on exertion 10/20/2018   Esophageal reflux 09/03/2013   Essential  hypertension 07/06/2018   ETOH abuse 01/11/2019   6 pack of beer per day   Falls 10/28/2017   GERD (gastroesophageal reflux disease)    H/O amiodarone therapy 07/01/2016   Heart attack (HCC)    Heart palpitations 01/27/2017   History of colon polyps    Hyperlipidemia    Hypertension    IPF (idiopathic pulmonary fibrosis) (HCC)    Kidney stones    Neck pain 09/03/2013   Neuropathy 07/06/2018   Obstructive sleep apnea 08/05/2015   PLMD (periodic limb movement disorder) 11/04/2015   Polycythemia, 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 STORY: Due to alcohol/ tobacco   Pure hypercholesterolemia 09/03/2013   E78.0)  Formatting of this note might be different from the original. E78.0)   Screening for prostate cancer 09/03/2013   Smoker 01/11/2019   Smoking 07/06/2018   Status post ablation of atrial flutter 07/06/2018   2017    Medications:  Medications Prior to Admission  Medication Sig Dispense Refill Last Dose   apixaban (ELIQUIS) 5 MG TABS tablet Take 1 tablet (5 mg total) by mouth 2 (two) times daily. 60 tablet 0 10/04/2022 at 0800   atorvastatin (LIPITOR) 80 MG tablet Take 1 tablet (80 mg total) by mouth daily. 90 tablet 2 10/05/2022   buPROPion (WELLBUTRIN XL)  300 MG 24 hr tablet Take 1 tablet (300 mg total) by mouth daily. 90 tablet 1 10/05/2022   busPIRone (BUSPAR) 15 MG tablet Take 1 tablet (15 mg total) by mouth 3 (three) times daily. 90 tablet 3 10/05/2022   finasteride (PROSCAR) 5 MG tablet Take 1 tablet (5 mg total) by mouth daily. 90 tablet 1 10/04/2022   Multiple Vitamins-Minerals (PRESERVISION AREDS PO) Take 1 capsule by mouth in the morning and at bedtime.   10/05/2022   Pirfenidone (ESBRIET) 801 MG TABS Take 1 tablet (801 mg total) by mouth 3 (three) times daily. 270 tablet 1 10/05/2022   pregabalin (LYRICA) 150 MG capsule Take 1 capsule (150 mg total) by mouth 2 (two) times daily. 90 capsule 5  10/05/2022   ranolazine (RANEXA) 1000 MG SR tablet Take 1 tablet (1,000 mg total) by mouth 2 (two) times daily. 180 tablet 1 10/05/2022   sertraline (ZOLOFT) 100 MG tablet Take 1.5 tablets (150 mg total) by mouth at bedtime. 135 tablet 1 10/05/2022   Cholecalciferol (VITAMIN D3) 25 MCG (1000 UT) CAPS Take 2 capsules by mouth 2 (two) times daily.      docusate sodium (COLACE) 100 MG capsule Take 100 mg by mouth daily.      famotidine (PEPCID) 40 MG tablet TAKE 1 TABLET(40 MG) BY MOUTH AT BEDTIME (Patient taking differently: Take 40 mg by mouth at bedtime.) 90 tablet 2    fexofenadine (ALLEGRA) 180 MG tablet Take 180 mg by mouth at bedtime.       guaiFENesin (MUCINEX) 600 MG 12 hr tablet Take 2 tablets (1,200 mg total) by mouth 2 (two) times daily. 120 tablet 0    lamoTRIgine (LAMICTAL) 100 MG tablet Take 1 tablet (100 mg total) by mouth daily. 30 tablet 1    levalbuterol (XOPENEX) 1.25 MG/0.5ML nebulizer solution Take 1.25 mg by nebulization every 4 (four) hours as needed for wheezing or shortness of breath. 1 each 12    LORazepam (ATIVAN) 0.5 MG tablet Take one tablet by mouth only for severe anxiety or agitation (Patient taking differently: Take 0.5 mg by mouth daily as needed for anxiety. Take one tablet by mouth only for severe anxiety or agitation. Has not taking medication yet.) 30 tablet 1    Melatonin 10 MG TABS Take 1 tablet by mouth at bedtime.      nitroGLYCERIN (NITROSTAT) 0.4 MG SL tablet Place 1 tablet (0.4 mg total) under the tongue every 5 (five) minutes as needed for chest pain. 25 tablet 3    ondansetron (ZOFRAN) 4 MG tablet Take 1 tablet (4 mg total) by mouth every 8 (eight) hours as needed for nausea or vomiting. 20 tablet 0    pantoprazole (PROTONIX) 40 MG tablet TAKE 1 TABLET(40 MG) BY MOUTH DAILY (Patient taking differently: Take 40 mg by mouth daily.) 90 tablet 1    Polyethylene Glycol 3350 (MIRALAX PO) Take 17 g by mouth daily.      Sodium Chloride Flush (NORMAL SALINE FLUSH) 0.9  % SOLN Flush drain with 5 mLs daily. Discard remaining 5ml in each syringe. 200 mL 0    traZODone (DESYREL) 50 MG tablet Take 1 tablet (50 mg total) by mouth at bedtime as needed for sleep (May take 1/2 tab to 1 tablet for sleep, as needed.). 30 tablet 3    umeclidinium-vilanterol (ANORO ELLIPTA) 62.5-25 MCG/ACT AEPB Inhale 1 puff into the lungs daily. 60 each 0    vitamin B-12 (CYANOCOBALAMIN) 1000 MCG tablet Take 1,000 mcg by mouth daily.  zolpidem (AMBIEN) 5 MG tablet Take 1 tablet (5 mg total) by mouth at bedtime as needed for sleep. 30 tablet 5     Assessment: 72 y.o male on Apixaban 5mg  BID prior to admission for PAF.  Apixaban is now on hold.   Pharmacy consulted to dose Enoxaparin for h/o afib as patient may need surgery.  Apixaban 5mg  last given inpatient at 01:16 7/17 today.  Hgb 15.1>12.1, pltc 252>171 CrCl 78 ml/min   Goal of Therapy:  Stroke prevention Monitor platelets by anticoagulation protocol: Yes   Plan:  Lovenox 1mg /kg = 90 mg SQ every 12 hours, start at 12noon today approximately 12 hours after last apixaban dose.  Monitor CBC, s/sx of bleeding, renal function. F/u for surgery plan and hold lovenox at appropriate prior to surgery.    Thank you for allowing pharmacy to be part of this patients care team. Noah Delaine, RPh Clinical Pharmacist  10/06/2022,10:07 AM

## 2022-10-06 NOTE — H&P (Signed)
History and Physical    Robert Lang MVH:[id number] DOB: [birth year] DOA: 10/05/2022  PCP: [name], [name]   Patient coming from: home   Chief Complaint: Right abdominal pain, shaking chills, decreased output from cholecystostomy tube   HPI: Robert Lang is a pleasant 72 y.o. male with medical history significant for IPF, COPD, OSA, CAD, hypertension, depression, anxiety, PAF on Eliquis, history of PE, and cholecystitis status post cholecystostomy tube placement on 08/30/2022 who presents with decreased output from his cholecystostomy tube, rigors, and right-sided abdominal pain.  Patient reports 3 to 4 days of decreased output from his cholecystostomy tube.  After his wife flush the tube last night, he began to develop severe pain in the right upper quadrant.  He also went on to develop shaking chills.  Pain is severe at times, mainly when he coughs or moves.  He denies acute change in his chronic dyspnea or cough, denies dysuria, and does not have any upper respiratory symptoms.  [facility] ED Course: Upon arrival to the ED, patient is found to be febrile to 38.9 C with tachypnea, mild tachycardia, and systolic blood pressure of 99 and greater.  Labs are most notable for WBC 13,200, lactic acid 2.5, and normal LFTs.  CT demonstrates percutaneous cholecystostomy tube in place with slight increase in gallbladder distention and similar degree of mild pericholecystic inflammation.  ED physician discussed the case with general surgery (Dr. Derrell Lolling) who recommended IR consultation.  Blood cultures were collected and the patient was given 3 L of LR, Zofran, morphine, acetaminophen, Rocephin, and Flagyl.  He was transferred to [facility] for admission.  Review of Systems:  All other systems reviewed and apart from HPI, are negative.  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 M75.00)   Allergic rhinitis due to pollen 09/03/2013   J30.1)  Formatting of this note might be different from the original. J30.1)   Allergy    Anticoagulated 07/01/2016   Anxiety    Arthritis    Atrial fibrillation (HCC)    Atrial flutter (HCC) 06/26/2015   CAD (coronary artery disease)    Chest pain 06/26/2015   COPD (chronic obstructive pulmonary disease) (HCC)    Coronary artery disease 07/06/2018   Cardiac catheterization 2017 showing 90% small diagonal branch disease   DDD (degenerative disc disease), cervical 09/03/2013   M50.90)  Formatting of this note might be different from the original. M50.90)   Depression    Depression    Depressive disorder 09/03/2013   Dizziness 10/28/2017   Dyslipidemia, goal LDL below 70 07/06/2018   Dyspnea on exertion 10/20/2018   Esophageal reflux 09/03/2013   Essential hypertension 07/06/2018   ETOH abuse 01/11/2019   6 pack of beer per day   Falls 10/28/2017   GERD (gastroesophageal reflux disease)    H/O amiodarone therapy 07/01/2016   Heart attack (HCC)    Heart palpitations 01/27/2017   History of colon polyps    Hyperlipidemia    Hypertension    IPF (idiopathic pulmonary fibrosis) (HCC)    Kidney stones    Neck pain 09/03/2013   Neuropathy 07/06/2018   Obstructive sleep apnea 08/05/2015   PLMD (periodic limb movement disorder) 11/04/2015   Polycythemia, 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 STORY: Due to alcohol/ tobacco   Pure hypercholesterolemia 09/03/2013   E78.0)  Formatting of this note might be different from the original. E78.0)   Screening for prostate cancer 09/03/2013   Smoker 01/11/2019   Smoking 07/06/2018   Status post ablation of atrial flutter 07/06/2018   2017    Past Surgical History:  Procedure Laterality Date   ATRIAL  FIBRILLATION ABLATION  10/2015   BACK SURGERY     CARDIOVERSION  2017   CATARACT EXTRACTION Bilateral 2017   March and April 2017   COLONOSCOPY  2018   CORONARY STENT PLACEMENT  2012   CORONARY/GRAFT ACUTE MI REVASCULARIZATION N/A 01/10/2019   Procedure: CORONARY/GRAFT ACUTE MI REVASCULARIZATION;  Surgeon: Marykay Lex, MD;  Location: [facility] INVASIVE CV LAB;  Service: Cardiovascular;  Laterality: N/A;   INGUINAL HERNIA REPAIR     over 20 years ago   IR CHOLANGIOGRAM EXISTING TUBE  09/16/2022   IR PERC CHOLECYSTOSTOMY  08/30/2022   LAPAROSCOPIC APPENDECTOMY N/A 01/17/2021   Procedure: APPENDECTOMY LAPAROSCOPIC;  Surgeon: Quentin Ore, MD;  Location: [facility] OR;  Service: General;  Laterality: N/A;   LEFT HEART CATH AND CORONARY ANGIOGRAPHY N/A 01/10/2019   Procedure: LEFT HEART CATH AND CORONARY ANGIOGRAPHY;  Surgeon: Marykay Lex, MD;  Location: [facility] INVASIVE CV LAB;  Service: Cardiovascular;  Laterality: N/A;   LUMBAR LAMINECTOMY Bilateral 04/05/2016   L2-L5    VENTRAL HERNIA REPAIR  2018    Social History:   reports that he quit smoking about 3 years ago. His smoking use included cigarettes. He started smoking about 33 years ago. He has a 30 pack-year smoking history. He has quit using smokeless tobacco. He reports current alcohol use. He reports that he does not use drugs.  Allergies  Allergen Reactions   Naproxen Other (See Comments)    (Naprosyn *ANALGESICS - ANTI-INFLAMMATORY*) Nausea, Abdominal pain   Silodosin Other (See Comments)    Low blood pressure    Family History  Problem Relation Age of Onset   Anxiety disorder Mother    Alcohol abuse Father    Colon cancer Father    Depression Brother    Alcohol abuse Brother    Throat cancer Brother      Prior to Admission medications   Medication Sig Start Date End Date Taking? Authorizing Provider  apixaban (ELIQUIS) 5 MG TABS tablet Take 1 tablet (5 mg total) by mouth 2 (two) times daily. 09/18/22  Yes Regalado,  Belkys A, MD  atorvastatin (LIPITOR) 80 MG tablet Take 1 tablet (80 mg total) by mouth daily. 04/28/22  Yes Georgeanna Lea, MD  buPROPion (WELLBUTRIN XL) 300 MG 24 hr tablet Take 1 tablet (300 mg total) by mouth daily. 08/20/22  Yes White, Arlys John A, NP  busPIRone (BUSPAR) 15 MG tablet Take 1 tablet (15 mg total) by mouth 3 (three) times daily. 08/20/22  Yes Avelina Laine A, NP  finasteride (PROSCAR) 5 MG tablet Take 1 tablet (5 mg total) by mouth daily. 03/10/21  Yes Georgeanna Lea, MD  Multiple Vitamins-Minerals (PRESERVISION AREDS PO) Take 1 capsule by mouth in the morning and at bedtime.   Yes [provider]  Pirfenidone (ESBRIET) 801 MG TABS Take 1 tablet (801 mg total) by mouth 3 (three) times daily. 06/24/22  Yes Kalman Shan, MD  pregabalin (LYRICA) 150 MG capsule Take 1 capsule (150 mg total) by mouth 2 (two) times daily. 07/15/22  Yes Windell Norfolk, MD  ranolazine (RANEXA) 1000 MG SR tablet  Take 1 tablet (1,000 mg total) by mouth 2 (two) times daily. 05/27/22  Yes Georgeanna Lea, MD  sertraline (ZOLOFT) 100 MG tablet Take 1.5 tablets (150 mg total) by mouth at bedtime. 06/28/22  Yes White, Watt Climes, NP  Cholecalciferol (VITAMIN D3) 25 MCG (1000 UT) CAPS Take 2 capsules by mouth 2 (two) times daily.    [provider]  docusate sodium (COLACE) 100 MG capsule Take 100 mg by mouth daily.    [provider]  famotidine (PEPCID) 40 MG tablet TAKE 1 TABLET(40 MG) BY MOUTH AT BEDTIME Patient taking differently: Take 40 mg by mouth at bedtime. 08/11/22   Georgeanna Lea, MD  fexofenadine (ALLEGRA) 180 MG tablet Take 180 mg by mouth at bedtime.  04/09/08   [provider]  guaiFENesin (MUCINEX) 600 MG 12 hr tablet Take 2 tablets (1,200 mg total) by mouth 2 (two) times daily. 09/18/22 10/18/22  Regalado, Jon Billings A, MD  lamoTRIgine (LAMICTAL) 100 MG tablet Take 1 tablet (100 mg total) by mouth daily. 09/14/22   Joan Flores, NP  levalbuterol (XOPENEX) 1.25  MG/0.5ML nebulizer solution Take 1.25 mg by nebulization every 4 (four) hours as needed for wheezing or shortness of breath. 09/18/22   Regalado, Belkys A, MD  LORazepam (ATIVAN) 0.5 MG tablet Take one tablet by mouth only for severe anxiety or agitation Patient taking differently: Take 0.5 mg by mouth daily as needed for anxiety. Take one tablet by mouth only for severe anxiety or agitation. Has not taking medication yet. 08/20/22   Joan Flores, NP  Melatonin 10 MG TABS Take 1 tablet by mouth at bedtime.    [provider]  nitroGLYCERIN (NITROSTAT) 0.4 MG SL tablet Place 1 tablet (0.4 mg total) under the tongue every 5 (five) minutes as needed for chest pain. 03/10/21   Georgeanna Lea, MD  ondansetron (ZOFRAN) 4 MG tablet Take 1 tablet (4 mg total) by mouth every 8 (eight) hours as needed for nausea or vomiting. 09/08/22   [name], [name], [name]  pantoprazole (PROTONIX) 40 MG tablet TAKE 1 TABLET(40 MG) BY MOUTH DAILY Patient taking differently: Take 40 mg by mouth daily. 07/01/22   Lynann Bologna, MD  Polyethylene Glycol 3350 (MIRALAX PO) Take 17 g by mouth daily.    [provider]  Sodium Chloride Flush (NORMAL SALINE FLUSH) 0.9 % SOLN Flush drain with 5 mLs daily. Discard remaining 5ml in each syringe. 09/01/22   Burnadette Pop, MD  traZODone (DESYREL) 50 MG tablet Take 1 tablet (50 mg total) by mouth at bedtime as needed for sleep (May take 1/2 tab to 1 tablet for sleep, as needed.). 06/28/22   Joan Flores, NP  umeclidinium-vilanterol (ANORO ELLIPTA) 62.5-25 MCG/ACT AEPB Inhale 1 puff into the lungs daily. 09/02/22   Burnadette Pop, MD  vitamin B-12 (CYANOCOBALAMIN) 1000 MCG tablet Take 1,000 mcg by mouth daily.    [provider]  zolpidem (AMBIEN) 5 MG tablet Take 1 tablet (5 mg total) by mouth at bedtime as needed for sleep. 06/15/22 12/12/22  Windell Norfolk, MD    Physical Exam: Vitals:   10/05/22 2000 10/05/22 2030 10/05/22 2230 10/05/22 2354  BP: 103/64 (!)  105/44 102/61 114/73  Pulse:   (!) 108   Resp: (!) 23 (!) 26 (!) 23   Temp:    98.7 F (37.1 C)  TempSrc:    Oral  SpO2: 91%  92%   Weight:         Constitutional: NAD,  calm  Eyes: PERTLA, lids and conjunctivae normal ENMT: Mucous membranes are moist. Posterior pharynx clear of any exudate or lesions.   Neck: supple, no masses  Respiratory:, no wheezing. No accessory muscle use.  Cardiovascular: S1 & S2 heard, regular rate and rhythm. No extremity edema.   Abdomen: Soft, tender in RUQ without rebound pain or guarding. Bowel sounds active.  Musculoskeletal: no clubbing / cyanosis. No joint deformity upper and lower extremities.   Skin: no significant rashes, lesions, ulcers. Warm, dry, well-perfused. Neurologic: CN 2-12 grossly intact. Moving all extremities. Alert and oriented.  Psychiatric: Pleasant. Cooperative.    Labs and Imaging on Admission: I have personally reviewed following labs and imaging studies  CBC: Recent Labs  Lab 10/05/22 1538  WBC 13.2*  NEUTROABS 10.2*  HGB 15.1  HCT 45.5  MCV 92.3  PLT 252   Basic Metabolic Panel: Recent Labs  Lab 10/05/22 1538  NA 133*  K 4.1  CL 101  CO2 22  GLUCOSE 108*  BUN 12  CREATININE 0.91  CALCIUM 8.7*   GFR: Estimated Creatinine Clearance: 78.2 mL/min (by C-G formula based on SCr of 0.91 mg/dL). Liver Function Tests: Recent Labs  Lab 10/05/22 1538  AST 21  ALT 23  ALKPHOS 78  BILITOT 0.5  PROT 7.4  ALBUMIN 3.5   No results for input(s): "LIPASE", "AMYLASE" in the last 168 hours. No results for input(s): "AMMONIA" in the last 168 hours. Coagulation Profile: Recent Labs  Lab 10/05/22 1551  INR 1.2   Cardiac Enzymes: No results for input(s): "CKTOTAL", "CKMB", "CKMBINDEX", "TROPONINI" in the last 168 hours. BNP (last 3 results) No results for input(s): "PROBNP" in the last 8760 hours. HbA1C: No results for input(s): "HGBA1C" in the last 72 hours. CBG: No results for input(s): "GLUCAP" in the  last 168 hours. Lipid Profile: No results for input(s): "CHOL", "HDL", "LDLCALC", "TRIG", "CHOLHDL", "LDLDIRECT" in the last 72 hours. Thyroid Function Tests: No results for input(s): "TSH", "T4TOTAL", "FREET4", "T3FREE", "THYROIDAB" in the last 72 hours. Anemia Panel: No results for input(s): "VITAMINB12", "FOLATE", "FERRITIN", "TIBC", "IRON", "RETICCTPCT" in the last 72 hours. Urine analysis:    Component Value Date/Time   COLORURINE AMBER (A) 10/05/2022 1538   APPEARANCEUR CLEAR 10/05/2022 1538   LABSPEC 1.025 10/05/2022 1538   PHURINE 5.5 10/05/2022 1538   GLUCOSEU NEGATIVE 10/05/2022 1538   HGBUR NEGATIVE 10/05/2022 1538   BILIRUBINUR NEGATIVE 10/05/2022 1538   BILIRUBINUR Negative 09/06/2018 1023   KETONESUR NEGATIVE 10/05/2022 1538   PROTEINUR 30 (A) 10/05/2022 1538   UROBILINOGEN negative (A) 09/06/2018 1023   NITRITE NEGATIVE 10/05/2022 1538   LEUKOCYTESUR NEGATIVE 10/05/2022 1538   Sepsis Labs: @LABRCNTIP (procalcitonin:4,lacticidven:4) ) Recent Results (from the past 240 hour(s))  Resp panel by RT-PCR (RSV, Flu A&B, Covid) Anterior Nasal Swab     Status: None   Collection Time: 10/05/22  3:51 PM   Specimen: Anterior Nasal Swab  Result Value Ref Range Status   SARS Coronavirus 2 by RT PCR NEGATIVE NEGATIVE Final    Comment: (NOTE) SARS-CoV-2 target nucleic acids are NOT DETECTED.  The SARS-CoV-2 RNA is generally detectable in upper respiratory specimens during the acute phase of infection. The lowest concentration of SARS-CoV-2 viral copies this assay can detect is 138 copies/mL. A negative result does not preclude SARS-Cov-2 infection and should not be used as the sole basis for treatment or other patient management decisions. A negative result may occur with  improper specimen collection/handling, submission of specimen other than nasopharyngeal swab, presence of  viral mutation(s) within the areas targeted by this assay, and inadequate number of  viral copies(<138 copies/mL). A negative result must be combined with clinical observations, patient history, and epidemiological information. The expected result is Negative.  Fact Sheet for Patients:  BloggerCourse.com  Fact Sheet for Healthcare Providers:  SeriousBroker.it  This test is no t yet approved or cleared by the [place] FDA and  has been authorized for detection and/or diagnosis of SARS-CoV-2 by FDA under an Emergency Use Authorization (EUA). This EUA will remain  in effect (meaning this test can be used) for the duration of the COVID-19 declaration under Section 564(b)(1) of the Act, 21 U.S.C.section 360bbb-3(b)(1), unless the authorization is terminated  or revoked sooner.       Influenza A by PCR NEGATIVE NEGATIVE Final   Influenza B by PCR NEGATIVE NEGATIVE Final    Comment: (NOTE) The Xpert Xpress SARS-CoV-2/FLU/RSV plus assay is intended as an aid in the diagnosis of influenza from Nasopharyngeal swab specimens and should not be used as a sole basis for treatment. Nasal washings and aspirates are unacceptable for Xpert Xpress SARS-CoV-2/FLU/RSV testing.  Fact Sheet for Patients: BloggerCourse.com  Fact Sheet for Healthcare Providers: SeriousBroker.it  This test is not yet approved or cleared by the [place] FDA and has been authorized for detection and/or diagnosis of SARS-CoV-2 by FDA under an Emergency Use Authorization (EUA). This EUA will remain in effect (meaning this test can be used) for the duration of the COVID-19 declaration under Section 564(b)(1) of the Act, 21 U.S.C. section 360bbb-3(b)(1), unless the authorization is terminated or revoked.     Resp Syncytial Virus by PCR NEGATIVE NEGATIVE Final    Comment: (NOTE) Fact Sheet for Patients: BloggerCourse.com  Fact Sheet for Healthcare  Providers: SeriousBroker.it  This test is not yet approved or cleared by the [place] FDA and has been authorized for detection and/or diagnosis of SARS-CoV-2 by FDA under an Emergency Use Authorization (EUA). This EUA will remain in effect (meaning this test can be used) for the duration of the COVID-19 declaration under Section 564(b)(1) of the Act, 21 U.S.C. section 360bbb-3(b)(1), unless the authorization is terminated or revoked.  Performed at [facility], [address] [place], [place] [address]   Blood culture (routine x 2)     Status: None (Preliminary result)   Collection Time: 10/05/22  4:00 PM   Specimen: BLOOD RIGHT FOREARM  Result Value Ref Range Status   Specimen Description   Final    BLOOD RIGHT FOREARM Performed at [facility], [address], [place], [place] [address]    Special Requests   Final    BOTTLES DRAWN AEROBIC AND ANAEROBIC Blood Culture adequate volume Performed at [facility], [address] [place], [place] [address]    Culture PENDING  Incomplete   Report Status PENDING  Incomplete  Blood culture (routine x 2)     Status: None (Preliminary result)   Collection Time: 10/05/22  4:10 PM   Specimen: BLOOD RIGHT ARM  Result Value Ref Range Status   Specimen Description   Final    BLOOD RIGHT ARM Performed at [facility], [address], [place], [place] [address]    Special Requests   Final    BOTTLES DRAWN AEROBIC AND ANAEROBIC Blood Culture adequate volume Performed at [facility], [address] [place], [place] [address]    Culture PENDING  Incomplete   Report Status  PENDING  Incomplete     Radiological Exams on Admission: CT ABDOMEN PELVIS W CONTRAST  Result Date: 10/05/2022 CLINICAL DATA:  Abdominal pain. EXAM: CT ABDOMEN AND PELVIS WITH CONTRAST TECHNIQUE: Multidetector CT imaging of the abdomen and pelvis was performed using the standard protocol  following bolus administration of intravenous contrast. RADIATION DOSE REDUCTION: This exam was performed according to the departmental dose-optimization program which includes automated exposure control, adjustment of the mA and/or kV according to patient size and/or use of iterative reconstruction technique. CONTRAST:  OMNIPAQUE IOHEXOL 300 MG/ML  SOLN COMPARISON:  CT abdomen pelvis dated September 16, 2022. FINDINGS: Lower chest: No acute abnormality.  Bibasilar atelectasis. Hepatobiliary: Unchanged small hemangioma in the right liver. No new focal liver abnormality. Percutaneous cholecystostomy tube remains in place. Slightly increased distention of the gallbladder compared to prior study. Unchanged mild pericholecystic inflammatory change. No biliary dilatation. Pancreas: Unremarkable. No pancreatic ductal dilatation or surrounding inflammatory changes. Spleen: Normal in size without focal abnormality. Adrenals/Urinary Tract: Adrenal glands are unremarkable. Kidneys are normal, without renal calculi, focal lesion, or hydronephrosis. Bladder is unremarkable. Stomach/Bowel: Stomach is within normal limits. Prior appendectomy. No evidence of bowel wall thickening, distention, or inflammatory changes. Left-sided colonic diverticulosis. Vascular/Lymphatic: Aortic atherosclerosis. No enlarged abdominal or pelvic lymph nodes. Reproductive: Prostate is unremarkable. Other: No abdominal wall hernia or abnormality. No abdominopelvic ascites. No pneumoperitoneum. Musculoskeletal: No acute or significant osseous findings. IMPRESSION: 1. Percutaneous cholecystostomy tube remains in place. Slightly increased distention of the gallbladder compared to prior study with similar degree of mild pericholecystic inflammatory change. Correlate with tube function. 2.  Aortic Atherosclerosis (ICD10-I70.0). Electronically Signed   By: Obie Dredge M.D.   On: 10/05/2022 17:55   DG Chest Portable 1 View  Result Date:  10/05/2022 CLINICAL DATA:  Provided history: Infection. EXAM: PORTABLE CHEST 1 VIEW COMPARISON:  Chest CT 09/16/2022. Prior chest radiographs 09/16/2022 and earlier. FINDINGS: The cardiomediastinal silhouette is unchanged. Aortic atherosclerosis. Linear opacities within the right lung base compatible with atelectasis and/or scarring. No appreciable airspace consolidation. No evidence of pleural effusion or pneumothorax. No acute osseous abnormality identified. IMPRESSION: 1. No evidence of airspace consolidation. 2. Atelectasis and/or scarring within the right lung base. 3. Aortic Atherosclerosis (ICD10-I70.0). Electronically Signed   By: Jackey Loge D.O.   On: 10/05/2022 16:15    EKG: Independently reviewed. Sinus tachycardia, rate 101, RBBB.   Assessment/Plan  1. Sepsis; right abdominal pain, decreased cholecystostomy tube output  - Treat with empiric cefepime and Flagyl, follow cultures and clinical course, consult IR for eval of cholecystostomy tube    2. COPD; IPF; OSA  - Not in exacerbation on admission  - Continue ICS-LAMA-LABA, pefinidone, and CPAP at bedtime   3. PAF  - Continue Eliquis   4. CAD  - No anginal symptoms  - Continue Lipitor, Ranexa    5. Depression, anxiety  - Continue home regimen     DVT prophylaxis: Eliquis  Code Status: Full  Level of Care: Level of care: Progressive Family Communication: None present   Disposition Plan:  Patient is from: home  Anticipated d/c is to: TBD Anticipated d/c date is: 10/09/22  Patient currently: Pending IR eval, cultures  Consults called: IR consulted via Epic order  Admission status: Inpatient     Briscoe Deutscher, MD [facility]  10/06/2022, 12:26 AM

## 2022-10-06 NOTE — Progress Notes (Signed)
Subjective: CC: Abdominal pain  Known to our service.    Patient with hx of pulmonary fibrosis, CAD, HTN, HLD, COPD, OSA, PAF, recent dx pulmonary embolism (6/9) on Eliquis (last dose 7/17am)   We last saw on 6/28. At that time he had IR cholangiogram that showed appropriately positioned and functioning cholecystostomy tube with patent cystic and common bile ducts. There was no fluoroscopic evidence of choledocholithiasis.  He also had a CT scan of the A/P that showed cholecystostomy tube without evidence of complication and no other acute issue.  Returned to the ED 7/16 with chills, nausea, and RUQ pain after he had 3 days of no drainage from perc chole drain (was draining 50cc/24 hours prior to this). Noted to be febrile to 102 and tachycardic. BP 99/66 at lowest but no hypotension. CT showed perc cholecystostomy tube in place with increased distension of the gallbladder compared ot prior study with degree of mild pericholecystic inflammatory changes. Lactic 3.3. > 1.6. WBC 9.9. LFT's wnl. Blood cx w/ Enterobacterales and Kleb PNA noted. Started on Rocephin/Flagyl.   Fever and tachycardia resolved this am. BP improved. IR has seen and plans drain exchange/upsize today.   History of prior inguinal hernia repair, laparoscopic appendectomy and ventral hernia repair  To note he is on Eliquis for PE that was found on 6/9.  He had a repeat CTA on 6/27 Radiology wrote an addendum on the CTA chest on 6/27 stating:  I was asked to review case at 9:10 am on 09/17/2022 with Dr. Sunnie Nielsen. Reported pulmonary embolus of the lingula likely due to streak or motion artifact. Additionally, previously performed chest CTA dated August 29, 2022 also significantly limited due to motion artifact and previously reported pulmonary embolus is not clearly appreciated, at the very least no embolus in any segmental or more proximal vessel.  Pulm saw him and recommended continuing Eliquis as he would be on this  for A. Fib anyway's. They also wrote in their note he would be high risk for general anesthesia, surgical intervention from a pulmonary standpoint (was having hypoxemia at that time and being tx for pna). They wrote there was nothing that precluded him from having gallbladder surgery if the benefits outweigh these risks and it should be okay for him to come off DOAC whenever general surgery deems appropriate for him to proceed. He is still on o2 today.   Objective: Vital signs in last 24 hours: Temp:  [98.2 F (36.8 C)-102 F (38.9 C)] 99.2 F (37.3 C) (07/17 1110) Pulse Rate:  [98-120] 98 (07/17 1110) Resp:  [17-31] 20 (07/17 1110) BP: (99-121)/(44-91) 110/76 (07/17 1110) SpO2:  [84 %-96 %] 93 % (07/17 1213) Weight:  [90.3 kg] 90.3 kg (07/16 1536)    Intake/Output from previous day: 07/16 0701 - 07/17 0700 In: 4863.6 [I.V.:1463.2; IV Piggyback:3400.4] Out: 750 [Urine:750] Intake/Output this shift: Total I/O In: -  Out: 200 [Urine:200]  PE: Gen:  Alert, NAD, pleasant Card:  Reg Pulm:  CTAB, no W/R/R, effort normal Abd: Soft, ND, RUQ ttp, IR perc chole drain with 150-200cc bilious output   Lab Results:  Recent Labs    10/05/22 1538 10/06/22 0524  WBC 13.2* 9.9  HGB 15.1 12.1*  HCT 45.5 37.0*  PLT 252 171   BMET Recent Labs    10/05/22 1538 10/06/22 0524  NA 133* 134*  K 4.1 4.3  CL 101 103  CO2 22 21*  GLUCOSE 108* 104*  BUN 12 11  CREATININE  0.91 0.91  CALCIUM 8.7* 8.0*   PT/INR Recent Labs    10/05/22 1551 10/06/22 1001  LABPROT 15.4* 17.6*  INR 1.2 1.4*   CMP     Component Value Date/Time   NA 134 (L) 10/06/2022 0524   NA 137 03/05/2020 1343   K 4.3 10/06/2022 0524   CL 103 10/06/2022 0524   CO2 21 (L) 10/06/2022 0524   GLUCOSE 104 (H) 10/06/2022 0524   BUN 11 10/06/2022 0524   BUN 14 03/05/2020 1343   CREATININE 0.91 10/06/2022 0524   CREATININE 0.92 09/02/2021 1541   CALCIUM 8.0 (L) 10/06/2022 0524   PROT 5.6 (L) 10/06/2022 0524   PROT  6.9 04/20/2019 0807   ALBUMIN 2.5 (L) 10/06/2022 0524   ALBUMIN 4.3 04/20/2019 0807   AST 21 10/06/2022 0524   ALT 23 10/06/2022 0524   ALKPHOS 62 10/06/2022 0524   BILITOT 0.5 10/06/2022 0524   BILITOT 0.5 04/20/2019 0807   GFRNONAA >60 10/06/2022 0524   GFRAA 81 03/05/2020 1343   Lipase     Component Value Date/Time   LIPASE 49 09/17/2022 0122    Studies/Results: CT ABDOMEN PELVIS W CONTRAST  Result Date: 10/05/2022 CLINICAL DATA:  Abdominal pain. EXAM: CT ABDOMEN AND PELVIS WITH CONTRAST TECHNIQUE: Multidetector CT imaging of the abdomen and pelvis was performed using the standard protocol following bolus administration of intravenous contrast. RADIATION DOSE REDUCTION: This exam was performed according to the departmental dose-optimization program which includes automated exposure control, adjustment of the mA and/or kV according to patient size and/or use of iterative reconstruction technique. CONTRAST:  OMNIPAQUE IOHEXOL 300 MG/ML  SOLN COMPARISON:  CT abdomen pelvis dated September 16, 2022. FINDINGS: Lower chest: No acute abnormality.  Bibasilar atelectasis. Hepatobiliary: Unchanged small hemangioma in the right liver. No new focal liver abnormality. Percutaneous cholecystostomy tube remains in place. Slightly increased distention of the gallbladder compared to prior study. Unchanged mild pericholecystic inflammatory change. No biliary dilatation. Pancreas: Unremarkable. No pancreatic ductal dilatation or surrounding inflammatory changes. Spleen: Normal in size without focal abnormality. Adrenals/Urinary Tract: Adrenal glands are unremarkable. Kidneys are normal, without renal calculi, focal lesion, or hydronephrosis. Bladder is unremarkable. Stomach/Bowel: Stomach is within normal limits. Prior appendectomy. No evidence of bowel wall thickening, distention, or inflammatory changes. Left-sided colonic diverticulosis. Vascular/Lymphatic: Aortic atherosclerosis. No enlarged abdominal or  pelvic lymph nodes. Reproductive: Prostate is unremarkable. Other: No abdominal wall hernia or abnormality. No abdominopelvic ascites. No pneumoperitoneum. Musculoskeletal: No acute or significant osseous findings. IMPRESSION: 1. Percutaneous cholecystostomy tube remains in place. Slightly increased distention of the gallbladder compared to prior study with similar degree of mild pericholecystic inflammatory change. Correlate with tube function. 2.  Aortic Atherosclerosis (ICD10-I70.0). Electronically Signed   By: Obie Dredge M.D.   On: 10/05/2022 17:55   DG Chest Portable 1 View  Result Date: 10/05/2022 CLINICAL DATA:  Provided history: Infection. EXAM: PORTABLE CHEST 1 VIEW COMPARISON:  Chest CT 09/16/2022. Prior chest radiographs 09/16/2022 and earlier. FINDINGS: The cardiomediastinal silhouette is unchanged. Aortic atherosclerosis. Linear opacities within the right lung base compatible with atelectasis and/or scarring. No appreciable airspace consolidation. No evidence of pleural effusion or pneumothorax. No acute osseous abnormality identified. IMPRESSION: 1. No evidence of airspace consolidation. 2. Atelectasis and/or scarring within the right lung base. 3. Aortic Atherosclerosis (ICD10-I70.0). Electronically Signed   By: Jackey Loge D.O.   On: 10/05/2022 16:15    Anti-infectives: Anti-infectives (From admission, onward)    Start     Dose/Rate Route Frequency Ordered Stop  10/06/22 1030  cefTRIAXone (ROCEPHIN) 2 g in sodium chloride 0.9 % 100 mL IVPB        2 g 200 mL/hr over 30 Minutes Intravenous Every 24 hours 10/06/22 0917     10/06/22 0600  metroNIDAZOLE (FLAGYL) IVPB 500 mg        500 mg 100 mL/hr over 60 Minutes Intravenous 2 times daily 10/06/22 0024 10/13/22 0959   10/06/22 0200  ceFEPIme (MAXIPIME) 2 g in sodium chloride 0.9 % 100 mL IVPB  Status:  Discontinued        2 g 200 mL/hr over 30 Minutes Intravenous Every 8 hours 10/06/22 0036 10/06/22 0917   10/05/22 1600   cefTRIAXone (ROCEPHIN) 2 g in sodium chloride 0.9 % 100 mL IVPB        2 g 200 mL/hr over 30 Minutes Intravenous  Once 10/05/22 1551 10/05/22 1658   10/05/22 1600  metroNIDAZOLE (FLAGYL) IVPB 500 mg        500 mg 100 mL/hr over 60 Minutes Intravenous  Once 10/05/22 1551 10/05/22 1837        Assessment/Plan RUQ abdominal pain  Hx of Perc Chole 6/8 for Acute Cholecystitis Patient with hx of perc chole 6/8 for Acute Cholecystitis. He presented with no drain output for 3 days, RUQ abdominal pain and was found to have fever to 102, tachycardia and soft BP's. CT showed perc cholecystostomy tube in place with increased distension of the gallbladder compared ot prior study with degree of mild pericholecystic inflammatory changes. Lactic 3.3. > 1.6. WBC 9.9. LFT's wnl. Blood cx w/ Enterobacterales and Kleb PNA noted. Started on Rocephin/Flagyl. He is now s/p IR perc chole exchange (no cholangiogram was performed). Drain currently appears to be draining adequately, patients pain has improved, he is tolerating a diet and vital signs have improved (no current fever, tachycardia or hypotension). Discussed case with MD. No plans for surgery during admission. He should plan to f/u with Dr. Sheliah Hatch next week as scheduled. He has already received pre-op eval by Pulm during his last admission. It would be beneficial for him to see Cardiology for risk stratification before his appointment with Dr. Sheliah Hatch but I don't think he needs to stay in the hospital for this. Defer abx selection and duration to [facility] given findings of bacteremia.   I reviewed nursing notes, ED provider notes, Consultant (IR) notes, hospitalist notes, last 24 h vitals and pain scores, last 48 h intake and output, last 24 h labs and trends, and last 24 h imaging results.   LOS: 1 day    Jacinto Halim , [facility] Surgery 10/06/2022, 11:03 AM Please see Amion for pager number during day hours 7:00am-4:30pm

## 2022-10-06 NOTE — Consult Note (Signed)
Chief Complaint: Patient was seen in consultation today for  Chief Complaint  Patient presents with   Abdominal Pain   Referring Physician(s): Dr. Antionette Char  Supervising Physician: Pernell Dupre  Patient Status: [facility] - In-pt  History of Present Illness: Robert Lang is a 72 y.o. male  with complex PMH including STEMI in 2020, anxiety, atrial fibrillation, CAD, COPD, hypertension, alcohol abuse, GERD, idiopathic pulmonary fibrosis, obstructive sleep apnea, and secondary polycythemia being seen today in relation to acute cholecystitis. Patient presented to [facility] on 08/28/22 with shortness of breath. He was found to have an acute pulmonary embolism and was started on a heparin drip. Patient was also found to have imaging concerning for acute cholecystitis on 6/9, and felt to not be a surgical candidate at this time due to heparin treatment for acute PE. IR was consulted for image-guided percutaneous cholecystostomy drain placement and this was done 08/30/22. He was discharged home 09/01/22.   He was seen in IR 09/16/22 for a cholangiogram which showed the tube to be appropriately positioned with patent cystic and common bile ducts. Later that day, he presented to the ED with complaints of worsening abdominal pain and shortness of breath. He was hypoxic and tachycardic.  He was admitted for severe sepsis secondary to aspiration pneumonia and was discharged home 09/18/22.   He has called IR several times in the past week with concerns about low output from his drain. The first time he called (7/15) he had no concerning symptoms and he was advised to continue with current plan of care. He called again 7/16 this time with complaints of RUQ pain, chills and generalized weakness. He was advised to go to the ED for further evaluation. In the ED he was found to be febrile with tachycardia, mild tachypnea, leukocytosis, and elevated lactic acid. CT showed drain appropriately positioned with a slight increase  of gallbladder distension.   Interventional Radiology asked to evaluate this patient for a cholangiogram with possible drain upsize/exchange.   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 M75.00)   Allergic rhinitis due to pollen 09/03/2013   J30.1)  Formatting of this note might be different from the original. J30.1)   Allergy    Anticoagulated 07/01/2016   Anxiety    Arthritis    Atrial fibrillation (HCC)    Atrial flutter (HCC) 06/26/2015   CAD (coronary artery disease)    Chest pain 06/26/2015   COPD (chronic obstructive pulmonary disease) (HCC)    Coronary artery disease 07/06/2018   Cardiac catheterization 2017 showing 90% small diagonal branch disease   DDD (degenerative disc disease), cervical 09/03/2013   M50.90)  Formatting of this note might be different from the original. M50.90)   Depression    Depression    Depressive disorder 09/03/2013   Dizziness 10/28/2017   Dyslipidemia, goal LDL below 70 07/06/2018   Dyspnea on exertion 10/20/2018   Esophageal reflux 09/03/2013   Essential hypertension 07/06/2018   ETOH abuse 01/11/2019   6 pack of beer per day   Falls 10/28/2017   GERD (gastroesophageal reflux disease)    H/O amiodarone therapy 07/01/2016   Heart attack (HCC)    Heart palpitations 01/27/2017   History of colon polyps    Hyperlipidemia    Hypertension    IPF (idiopathic pulmonary fibrosis) (HCC)  Kidney stones    Neck pain 09/03/2013   Neuropathy 07/06/2018   Obstructive sleep apnea 08/05/2015   PLMD (periodic limb movement disorder) 11/04/2015   Polycythemia, 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 STORY: Due to alcohol/ tobacco   Pure hypercholesterolemia 09/03/2013    E78.0)  Formatting of this note might be different from the original. E78.0)   Screening for prostate cancer 09/03/2013   Smoker 01/11/2019   Smoking 07/06/2018   Status post ablation of atrial flutter 07/06/2018   2017    Past Surgical History:  Procedure Laterality Date   ATRIAL FIBRILLATION ABLATION  10/2015   BACK SURGERY     CARDIOVERSION  2017   CATARACT EXTRACTION Bilateral 2017   March and April 2017   COLONOSCOPY  2018   CORONARY STENT PLACEMENT  2012   CORONARY/GRAFT ACUTE MI REVASCULARIZATION N/A 01/10/2019   Procedure: CORONARY/GRAFT ACUTE MI REVASCULARIZATION;  Surgeon: Marykay Lex, MD;  Location: [facility] INVASIVE CV LAB;  Service: Cardiovascular;  Laterality: N/A;   INGUINAL HERNIA REPAIR     over 20 years ago   IR CHOLANGIOGRAM EXISTING TUBE  09/16/2022   IR PERC CHOLECYSTOSTOMY  08/30/2022   LAPAROSCOPIC APPENDECTOMY N/A 01/17/2021   Procedure: APPENDECTOMY LAPAROSCOPIC;  Surgeon: Quentin Ore, MD;  Location: [facility] OR;  Service: General;  Laterality: N/A;   LEFT HEART CATH AND CORONARY ANGIOGRAPHY N/A 01/10/2019   Procedure: LEFT HEART CATH AND CORONARY ANGIOGRAPHY;  Surgeon: Marykay Lex, MD;  Location: [facility] INVASIVE CV LAB;  Service: Cardiovascular;  Laterality: N/A;   LUMBAR LAMINECTOMY Bilateral 04/05/2016   L2-L5    VENTRAL HERNIA REPAIR  2018    Allergies: Naproxen and Silodosin  Medications: Prior to Admission medications   Medication Sig Start Date End Date Taking? Authorizing Provider  apixaban (ELIQUIS) 5 MG TABS tablet Take 1 tablet (5 mg total) by mouth 2 (two) times daily. 09/18/22  Yes Regalado, Belkys A, MD  atorvastatin (LIPITOR) 80 MG tablet Take 1 tablet (80 mg total) by mouth daily. 04/28/22  Yes Georgeanna Lea, MD  buPROPion (WELLBUTRIN XL) 300 MG 24 hr tablet Take 1 tablet (300 mg total) by mouth daily. 08/20/22  Yes White, Arlys John A, NP  busPIRone (BUSPAR) 15 MG tablet Take 1 tablet (15 mg total) by mouth 3 (three) times daily.  08/20/22  Yes Avelina Laine A, NP  finasteride (PROSCAR) 5 MG tablet Take 1 tablet (5 mg total) by mouth daily. 03/10/21  Yes Georgeanna Lea, MD  Multiple Vitamins-Minerals (PRESERVISION AREDS PO) Take 1 capsule by mouth in the morning and at bedtime.   Yes [provider]  Pirfenidone (ESBRIET) 801 MG TABS Take 1 tablet (801 mg total) by mouth 3 (three) times daily. 06/24/22  Yes Kalman Shan, MD  pregabalin (LYRICA) 150 MG capsule Take 1 capsule (150 mg total) by mouth 2 (two) times daily. 07/15/22  Yes Windell Norfolk, MD  ranolazine (RANEXA) 1000 MG SR tablet Take 1 tablet (1,000 mg total) by mouth 2 (two) times daily. 05/27/22  Yes Georgeanna Lea, MD  sertraline (ZOLOFT) 100 MG tablet Take 1.5 tablets (150 mg total) by mouth at bedtime. 06/28/22  Yes White, Watt Climes, NP  Cholecalciferol (VITAMIN D3) 25 MCG (1000 UT) CAPS Take 2 capsules by mouth 2 (two) times daily.    [provider]  docusate sodium (COLACE) 100 MG capsule Take 100 mg by mouth daily.  [provider]  famotidine (PEPCID) 40 MG tablet TAKE 1 TABLET(40 MG) BY MOUTH AT BEDTIME Patient taking differently: Take 40 mg by mouth at bedtime. 08/11/22   Georgeanna Lea, MD  fexofenadine (ALLEGRA) 180 MG tablet Take 180 mg by mouth at bedtime.  04/09/08   [provider]  guaiFENesin (MUCINEX) 600 MG 12 hr tablet Take 2 tablets (1,200 mg total) by mouth 2 (two) times daily. 09/18/22 10/18/22  Regalado, Jon Billings A, MD  lamoTRIgine (LAMICTAL) 100 MG tablet Take 1 tablet (100 mg total) by mouth daily. 09/14/22   Joan Flores, NP  levalbuterol (XOPENEX) 1.25 MG/0.5ML nebulizer solution Take 1.25 mg by nebulization every 4 (four) hours as needed for wheezing or shortness of breath. 09/18/22   Regalado, Belkys A, MD  LORazepam (ATIVAN) 0.5 MG tablet Take one tablet by mouth only for severe anxiety or agitation Patient taking differently: Take 0.5 mg by mouth daily as needed for anxiety. Take one tablet  by mouth only for severe anxiety or agitation. Has not taking medication yet. 08/20/22   Joan Flores, NP  Melatonin 10 MG TABS Take 1 tablet by mouth at bedtime.    [provider]  nitroGLYCERIN (NITROSTAT) 0.4 MG SL tablet Place 1 tablet (0.4 mg total) under the tongue every 5 (five) minutes as needed for chest pain. 03/10/21   Georgeanna Lea, MD  ondansetron (ZOFRAN) 4 MG tablet Take 1 tablet (4 mg total) by mouth every 8 (eight) hours as needed for nausea or vomiting. 09/08/22   [name], [name], [name]  pantoprazole (PROTONIX) 40 MG tablet TAKE 1 TABLET(40 MG) BY MOUTH DAILY Patient taking differently: Take 40 mg by mouth daily. 07/01/22   Lynann Bologna, MD  Polyethylene Glycol 3350 (MIRALAX PO) Take 17 g by mouth daily.    [provider]  Sodium Chloride Flush (NORMAL SALINE FLUSH) 0.9 % SOLN Flush drain with 5 mLs daily. Discard remaining 5ml in each syringe. 09/01/22   Burnadette Pop, MD  traZODone (DESYREL) 50 MG tablet Take 1 tablet (50 mg total) by mouth at bedtime as needed for sleep (May take 1/2 tab to 1 tablet for sleep, as needed.). 06/28/22   Joan Flores, NP  umeclidinium-vilanterol (ANORO ELLIPTA) 62.5-25 MCG/ACT AEPB Inhale 1 puff into the lungs daily. 09/02/22   Burnadette Pop, MD  vitamin B-12 (CYANOCOBALAMIN) 1000 MCG tablet Take 1,000 mcg by mouth daily.    [provider]  zolpidem (AMBIEN) 5 MG tablet Take 1 tablet (5 mg total) by mouth at bedtime as needed for sleep. 06/15/22 12/12/22  Windell Norfolk, MD     Family History  Problem Relation Age of Onset   Anxiety disorder Mother    Alcohol abuse Father    Colon cancer Father    Depression Brother    Alcohol abuse Brother    Throat cancer Brother     Social History   Socioeconomic History   Marital status: Married    Spouse name: Not on file   Number of children: Not on file   Years of education: Not on file   Highest education level: Not on file  Occupational History   Not on  file  Tobacco Use   Smoking status: Former    Current packs/day: 0.00    Average packs/day: 1 pack/day for 30.0 years (30.0 ttl pk-yrs)    Types: Cigarettes    Start date: 01/09/1989    Quit date: 01/10/2019    Years since quitting: 3.7  Smokeless tobacco: Former  Building services engineer status: Never Used  Substance and Sexual Activity   Alcohol use: Yes    Comment: 8oz of wine a day   Drug use: Never   Sexual activity: Not on file  Other Topics Concern   Not on file  Social History Narrative   Not on file   Social Determinants of Health   Financial Resource Strain: Low Risk  (12/22/2021)   Overall Financial Resource Strain (CARDIA)    Difficulty of Paying Living Expenses: Not hard at all  Food Insecurity: No Food Insecurity (09/21/2022)   Hunger Vital Sign    Worried About Running Out of Food in the Last Year: Never true    Ran Out of Food in the Last Year: Never true  Transportation Needs: No Transportation Needs (09/21/2022)   PRAPARE - Administrator, Civil Service (Medical): No    Lack of Transportation (Non-Medical): No  Physical Activity: Inactive (12/22/2021)   Exercise Vital Sign    Days of Exercise per Week: 0 days    Minutes of Exercise per Session: 0 min  Stress: No Stress Concern Present (12/22/2021)   [employer] of Occupational Health - Occupational Stress Questionnaire    Feeling of Stress : Not at all  Social Connections: Moderately Integrated (12/22/2021)   Social Connection and Isolation Panel [NHANES]    Frequency of Communication with Friends and Family: More than three times a week    Frequency of Social Gatherings with Friends and Family: More than three times a week    Attends Religious Services: More than 4 times per year    Active Member of [employer] or Organizations: No    Attends Banker Meetings: Never    Marital Status: Married    Review of Systems: A 12 point ROS discussed and pertinent positives are indicated in the  HPI above.  All other systems are negative.  Review of Systems  Unable to perform ROS: Acuity of condition    Vital Signs: BP 109/63 (BP Location: Right Arm)   Pulse (!) 102   Temp 99.5 F (37.5 C) (Oral)   Resp 17   Wt 199 lb (90.3 kg)   SpO2 94%   BMI 27.75 kg/m   Physical Exam Constitutional:      General: He is not in acute distress. HENT:     Mouth/Throat:     Mouth: Mucous membranes are moist.     Pharynx: Oropharynx is clear.  Cardiovascular:     Rate and Rhythm: Regular rhythm. Tachycardia present.     Pulses: Normal pulses.     Heart sounds: Normal heart sounds.  Pulmonary:     Effort: Pulmonary effort is normal.     Breath sounds: Normal breath sounds.  Abdominal:     Palpations: Abdomen is soft.     Tenderness: There is abdominal tenderness.  Skin:    General: Skin is warm and dry.  Neurological:     Mental Status: He is alert and oriented to person, place, and time.     Imaging: CT ABDOMEN PELVIS W CONTRAST  Result Date: 10/05/2022 CLINICAL DATA:  Abdominal pain. EXAM: CT ABDOMEN AND PELVIS WITH CONTRAST TECHNIQUE: Multidetector CT imaging of the abdomen and pelvis was performed using the standard protocol following bolus administration of intravenous contrast. RADIATION DOSE REDUCTION: This exam was performed according to the departmental dose-optimization program which includes automated exposure control, adjustment of the mA and/or kV according to patient size  and/or use of iterative reconstruction technique. CONTRAST:  OMNIPAQUE IOHEXOL 300 MG/ML  SOLN COMPARISON:  CT abdomen pelvis dated September 16, 2022. FINDINGS: Lower chest: No acute abnormality.  Bibasilar atelectasis. Hepatobiliary: Unchanged small hemangioma in the right liver. No new focal liver abnormality. Percutaneous cholecystostomy tube remains in place. Slightly increased distention of the gallbladder compared to prior study. Unchanged mild pericholecystic inflammatory change. No biliary  dilatation. Pancreas: Unremarkable. No pancreatic ductal dilatation or surrounding inflammatory changes. Spleen: Normal in size without focal abnormality. Adrenals/Urinary Tract: Adrenal glands are unremarkable. Kidneys are normal, without renal calculi, focal lesion, or hydronephrosis. Bladder is unremarkable. Stomach/Bowel: Stomach is within normal limits. Prior appendectomy. No evidence of bowel wall thickening, distention, or inflammatory changes. Left-sided colonic diverticulosis. Vascular/Lymphatic: Aortic atherosclerosis. No enlarged abdominal or pelvic lymph nodes. Reproductive: Prostate is unremarkable. Other: No abdominal wall hernia or abnormality. No abdominopelvic ascites. No pneumoperitoneum. Musculoskeletal: No acute or significant osseous findings. IMPRESSION: 1. Percutaneous cholecystostomy tube remains in place. Slightly increased distention of the gallbladder compared to prior study with similar degree of mild pericholecystic inflammatory change. Correlate with tube function. 2.  Aortic Atherosclerosis (ICD10-I70.0). Electronically Signed   By: Obie Dredge M.D.   On: 10/05/2022 17:55   DG Chest Portable 1 View  Result Date: 10/05/2022 CLINICAL DATA:  Provided history: Infection. EXAM: PORTABLE CHEST 1 VIEW COMPARISON:  Chest CT 09/16/2022. Prior chest radiographs 09/16/2022 and earlier. FINDINGS: The cardiomediastinal silhouette is unchanged. Aortic atherosclerosis. Linear opacities within the right lung base compatible with atelectasis and/or scarring. No appreciable airspace consolidation. No evidence of pleural effusion or pneumothorax. No acute osseous abnormality identified. IMPRESSION: 1. No evidence of airspace consolidation. 2. Atelectasis and/or scarring within the right lung base. 3. Aortic Atherosclerosis (ICD10-I70.0). Electronically Signed   By: Jackey Loge D.O.   On: 10/05/2022 16:15   VAS [place] LOWER EXTREMITY VENOUS (DVT)  Result Date: 09/17/2022  Lower Venous DVT Study  Patient Name:  PAULINO CORK  Date of Exam:   09/17/2022 Medical Rec #: [id number]          Accession #:    [id number] Date of Birth: [birth year]           Patient Gender: M Patient Age:   55 years Exam Location:  [facility] Procedure:      VAS [place] LOWER EXTREMITY VENOUS (DVT) Referring Phys: Jon Billings REGALADO --------------------------------------------------------------------------------  Indications: Edema.  Risk Factors: None identified. Comparison Study: 09/01/2022 - BILATERAL:                   - No evidence of deep vein thrombosis seen in the lower                   extremities, bilaterally.                   -No evidence of popliteal cyst, bilaterally. Performing Technologist: Chanda Busing RVT  Examination Guidelines: A complete evaluation includes B-mode imaging, spectral Doppler, color Doppler, and power Doppler as needed of all accessible portions of each vessel. Bilateral testing is considered an integral part of a complete examination. Limited examinations for reoccurring indications may be performed as noted. The reflux portion of the exam is performed with the patient in reverse Trendelenburg.  +---------+---------------+---------+-----------+----------+--------------+ RIGHT    CompressibilityPhasicitySpontaneityPropertiesThrombus Aging +---------+---------------+---------+-----------+----------+--------------+ CFV      Full           Yes      Yes                                 +---------+---------------+---------+-----------+----------+--------------+  SFJ      Full                                                        +---------+---------------+---------+-----------+----------+--------------+ FV Prox  Full                                                        +---------+---------------+---------+-----------+----------+--------------+ FV Mid   Full                                                         +---------+---------------+---------+-----------+----------+--------------+ FV DistalFull                                                        +---------+---------------+---------+-----------+----------+--------------+ PFV      Full                                                        +---------+---------------+---------+-----------+----------+--------------+ POP      Full           Yes      Yes                                 +---------+---------------+---------+-----------+----------+--------------+ PTV      Full                                                        +---------+---------------+---------+-----------+----------+--------------+ PERO     Full                                                        +---------+---------------+---------+-----------+----------+--------------+   +---------+---------------+---------+-----------+----------+--------------+ LEFT     CompressibilityPhasicitySpontaneityPropertiesThrombus Aging +---------+---------------+---------+-----------+----------+--------------+ CFV      Full           Yes      Yes                                 +---------+---------------+---------+-----------+----------+--------------+ SFJ      Full                                                        +---------+---------------+---------+-----------+----------+--------------+  FV Prox  Full                                                        +---------+---------------+---------+-----------+----------+--------------+ FV Mid   Full                                                        +---------+---------------+---------+-----------+----------+--------------+ FV DistalFull                                                        +---------+---------------+---------+-----------+----------+--------------+ PFV      Full                                                         +---------+---------------+---------+-----------+----------+--------------+ POP      Full           Yes      Yes                                 +---------+---------------+---------+-----------+----------+--------------+ PTV      Full                                                        +---------+---------------+---------+-----------+----------+--------------+ PERO     Full                                                        +---------+---------------+---------+-----------+----------+--------------+     Summary: RIGHT: - There is no evidence of deep vein thrombosis in the lower extremity.  - No cystic structure found in the popliteal fossa.  LEFT: - There is no evidence of deep vein thrombosis in the lower extremity.  - No cystic structure found in the popliteal fossa.  *See table(s) above for measurements and observations. Electronically signed by Coral Else MD on 09/17/2022 at 10:47:26 PM.    Final    CT Angio Chest PE W and/or Wo Contrast  Addendum Date: 09/17/2022   ADDENDUM REPORT: 09/17/2022 11:11 ADDENDUM: I was asked to review case at 9:10 am on 09/17/2022 with Dr. Sunnie Nielsen. Reported pulmonary embolus of the lingula likely due to streak or motion artifact. Additionally, previously performed chest CTA dated August 29, 2022 also significantly limited due to motion artifact and previously reported pulmonary embolus is not clearly appreciated, at the very least no embolus in any segmental or more proximal vessel. Lower extremity ultrasound or repeat chest CTA could be performed for  further evaluation. Electronically Signed   By: Allegra Lai M.D.   On: 09/17/2022 11:11   Result Date: 09/17/2022 CLINICAL DATA:  Pulmonary embolism suspected. High probability. New onset hypoxia and shortness of breath. Also severe abdominal pain. Recently discharged after treatment for small subsegmental arterial emboli in the left lower lobe. EXAM: CT ANGIOGRAPHY CHEST WITH CONTRAST TECHNIQUE:  Multidetector CT imaging of the chest was performed using the standard protocol during bolus administration of intravenous contrast. Multiplanar CT image reconstructions and MIPs were obtained to evaluate the vascular anatomy. RADIATION DOSE REDUCTION: This exam was performed according to the departmental dose-optimization program which includes automated exposure control, adjustment of the mA and/or kV according to patient size and/or use of iterative reconstruction technique. CONTRAST:  OMNIPAQUE IOHEXOL 350 MG/ML SOLN COMPARISON:  Portable chest today, CTA chest 08/29/2022, CTA chest 08/25/2021 FINDINGS: Cardiovascular: Previously the patient had scattered subsegmental left lower lobe arterial filling defects. These are no longer seen. There is a small-volume embolic filling defect within a single lingular subsegmental artery on series 2 axial 43 and 44 and also visible on series 5 images 63-65. No further emboli are detected. There is panchamber cardiomegaly with a slight right chamber predominance which was seen previously but no IVC reflux to suspect acute right heart strain. There is no pericardial effusion. There are heavy calcifications in the left main and LAD coronary arteries, additional calcifications and stenting in the right coronary artery and small amount of calcification in the proximal circumflex artery. Lipomatous hypertrophy of the inter-atrial septum is chronically noted as well. The pulmonary veins are nondistended. The aorta and great vessels are tortuous but normal caliber. No dissection is seen. There is moderate patchy aortic calcific plaque, less heavy scattered plaque in the great vessels. Mediastinum/Nodes: No enlarged mediastinal, hilar, or axillary lymph nodes. Thyroid gland, trachea, and esophagus demonstrate no significant findings. Lungs/Pleura: Bilateral trace pleural effusions are similar to the last CT. No pneumothorax. There are opacities in both posterior basal lower  lobes, noted to varying degrees on both prior studies, bilaterally increased today and could indicate increased atelectasis or developing pneumonia. There is a chronically elevated right hemidiaphragm and mild posterior atelectasis in the upper lobes. The lungs are otherwise clear. No nodules are seen. The main bronchi are patent. There is chronic bronchial thickening in the lower lobes. Upper Abdomen: A percutaneous cholecystostomy drain is noted. The liver is steatotic and mildly enlarged. No other significant findings in the visualized upper abdomen. Musculoskeletal: Osteopenia and degenerative change of the spine. No acute osseous findings. No aggressive regional bone lesion. No chest wall mass is seen. Review of the MIP images confirms the above findings. IMPRESSION: 1. Positive for a single small-volume subsegmental embolus in the lingula. No other emboli are seen. 2. Subsegmental emboli in the left lower lobe noted previously have cleared in the interval. 3. Cardiomegaly without evidence of acute right heart strain or venous dilatation. Chronic slight right chamber predominance. 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 4. Aortic and coronary artery atherosclerosis and lipomatous hypertrophy of the inter-atrial septum. 5. Trace pleural effusions with increased opacities in the posterior basal lower lobes which could be atelectasis or developing pneumonia. 6. Chronic lower lobe bronchial thickening. 7. Percutaneous cholecystostomy drain. 8. Hepatic steatosis and mild hepatomegaly. 9. Osteopenia and degenerative change. Aortic Atherosclerosis (ICD10-I70.0). Electronically Signed: By: Almira Bar M.D. On: 09/16/2022 20:53   CT ABDOMEN PELVIS W CONTRAST  Result Date: 09/16/2022 CLINICAL DATA:  Right lower quadrant abdominal pain since yesterday, history  of cholecystostomy tube EXAM: CT ABDOMEN AND PELVIS WITH CONTRAST TECHNIQUE: Multidetector CT imaging of the abdomen and pelvis was performed using the  standard protocol following bolus administration of intravenous contrast. RADIATION DOSE REDUCTION: This exam was performed according to the departmental dose-optimization program which includes automated exposure control, adjustment of the mA and/or kV according to patient size and/or use of iterative reconstruction technique. CONTRAST:  OMNIPAQUE IOHEXOL 350 MG/ML SOLN COMPARISON:  09/16/2022, 08/29/2022 FINDINGS: Lower chest: Bibasilar hypoventilatory changes. No acute pleural or parenchymal lung disease. Atherosclerosis of the aorta and coronary vasculature. Hepatobiliary: Stable hemangioma right lobe liver. Otherwise the liver is unremarkable. Percutaneous cholecystostomy tube is seen coiled within the gallbladder lumen, with complete decompression of the gallbladder. There is mild pericholecystic fat stranding which may be related history of cholecystitis or indwelling drain. No biliary duct dilation. Pancreas: Unremarkable. No pancreatic ductal dilatation or surrounding inflammatory changes. Spleen: Normal in size without focal abnormality. Adrenals/Urinary Tract: Calcifications of the bilateral renal hila are likely vascular. No urinary tract calculi or obstructive uropathy. Kidneys enhance normally. The adrenals and bladder are unremarkable. Stomach/Bowel: No bowel obstruction or ileus. Diverticulosis of the descending and sigmoid colon without diverticulitis. Prior appendectomy. No bowel wall thickening. Vascular/Lymphatic: Aortic atherosclerosis. No enlarged abdominal or pelvic lymph nodes. Reproductive: Prostate is unremarkable. Other: No free fluid or free intraperitoneal gas. No abdominal wall hernia. Musculoskeletal: No acute or destructive bony abnormalities. Reconstructed images demonstrate no additional findings. IMPRESSION: 1. Indwelling cholecystostomy tube without evidence of complication. 2. Diverticulosis without diverticulitis. 3.  Aortic Atherosclerosis (ICD10-I70.0). Electronically  Signed   By: Sharlet Salina M.D.   On: 09/16/2022 20:36   DG Abdomen 1 View  Result Date: 09/16/2022 CLINICAL DATA:  Abdominal pain and chills. EXAM: ABDOMEN - 1 VIEW COMPARISON:  None Available. FINDINGS: The bowel gas pattern is normal. A percutaneous biliary drainage catheter is seen overlying the right upper quadrant. No radio-opaque calculi or other significant radiographic abnormality are seen. IMPRESSION: Percutaneous biliary drainage catheter overlying the right upper quadrant. Electronically Signed   By: Aram Candela M.D.   On: 09/16/2022 19:30   DG Chest Portable 1 View  Result Date: 09/16/2022 CLINICAL DATA:  Abdominal pain and chills EXAM: PORTABLE CHEST 1 VIEW COMPARISON:  August 28, 2022 FINDINGS: The heart size and mediastinal contours are within normal limits. There is moderate severity calcification of the aortic arch. Low lung volumes are noted with mild, diffuse, chronic appearing increased interstitial lung markings. Mild atelectasis is seen within the bilateral lung bases. There is no evidence of a pleural effusion or pneumothorax. A biliary drainage catheter is seen overlying the lateral aspect of the right upper quadrant. Multilevel degenerative changes are seen throughout the thoracic spine. IMPRESSION: Low lung volumes with mild bibasilar atelectasis. Electronically Signed   By: Aram Candela M.D.   On: 09/16/2022 19:29   IR CHOLANGIOGRAM EXISTING TUBE  Result Date: 09/16/2022 INDICATION: History of acute cholecystitis, post ultrasound cholecystostomy tube placement on 08/30/2022. EXAM: CHOLECYSTOSTOMY TUBE EVALUATION and ANTEGRADE CHOLANGIOGRAM COMPARISON:  IR fluoroscopy, 08/30/2022.  CTA chest, 08/29/2022. MEDICATIONS: None ANESTHESIA/SEDATION: None CONTRAST:  5mL OMNIPAQUE IOHEXOL 300 MG/ML SOLN - administered into the gallbladder fossa. FLUOROSCOPY TIME:  Fluoroscopic dose; 4 mGy COMPLICATIONS: None immediate. PROCEDURE: The patient was positioned supine on the  fluoroscopy table. A preprocedural spot fluoroscopic image was obtained of the right upper abdominal quadrant existing cholecystostomy tube. Multiple spot fluoroscopic radiographic images were obtained of the RIGHT upper abdominal quadrant an existing cholecystostomy tube following injection  of a small amount of contrast. Images were reviewed and discussed with the patient. The cholecystostomy tube was flushed with a small amount of saline. The previous stitch was removed, and a new 2-0 Ethilon stitch was placed. A dressing was placed. The patient tolerated the procedure well without immediate postprocedural complication. Procedure assisted by, Loran Senters IR RT FINDINGS: Preprocedural spot fluoroscopic image of the right upper abdominal quadrant demonstrates grossly unchanged positioning of the cholecystostomy tube with end coiled and locked overlying the expected location of the gallbladder fundus. Subsequent contrast injection demonstrates appropriate functionality of the cholecystostomy tube with brisk opacification of the gallbladder. There is passage of contrast from the gallbladder through the cystic and common bile ducts the level duodenum. No biliary ductal filling defect to suggest the presence of choledocholithiasis. IMPRESSION: 1. Appropriately positioned and functioning cholecystostomy tube. No exchange performed. 2. Patent cystic and common bile ducts. No fluoroscopic evidence of choledocholithiasis. PLAN: The patient will return to Vascular Interventional Radiology (VIR) for routine drainage catheter evaluation and exchange in 6 weeks (from catheter placement). Roanna Banning, MD [facility] Electronically Signed   By: Roanna Banning M.D.   On: 09/16/2022 14:15    Labs:  CBC: Recent Labs    09/17/22 0122 09/18/22 0542 10/05/22 1538 10/06/22 0524  WBC 11.5* 7.2 13.2* 9.9  HGB 13.5 12.8* 15.1 12.1*  HCT 42.7 40.7 45.5 37.0*  PLT 165 150  252 171    COAGS: Recent Labs    08/30/22 1205 08/31/22 0202 08/31/22 1047 09/01/22 0640 10/05/22 1551  INR 1.1  --   --   --  1.2  APTT  --  59* 88* 137* 36    BMP: Recent Labs    09/17/22 0122 09/18/22 0542 10/05/22 1538 10/06/22 0524  NA 132* 133* 133* 134*  K 3.9 4.1 4.1 4.3  CL 101 106 101 103  CO2 23 21* 22 21*  GLUCOSE 128* 119* 108* 104*  BUN 12 10 12 11   CALCIUM 8.1* 8.2* 8.7* 8.0*  CREATININE 0.94 0.73 0.91 0.91  GFRNONAA >60 >60 >60 >60    LIVER FUNCTION TESTS: Recent Labs    09/16/22 1825 09/17/22 0122 10/05/22 1538 10/06/22 0524  BILITOT 0.8 0.7 0.5 0.5  AST 25 18 21 21   ALT 29 22 23 23   ALKPHOS 67 48 78 62  PROT 7.6 6.4* 7.4 5.6*  ALBUMIN 3.7 2.9* 3.5 2.5*    TUMOR MARKERS: No results for input(s): "AFPTM", "CEA", "CA199", "CHROMGRNA" in the last 8760 hours.  Assessment and Plan:  Acute cholecystitis s/p percutaneous cholecystostomy 08/30/22, now admitted with Sepsis and decreased output from cholecystostomy: [name], 72 year old male, presents today to the [facility] Interventional Radiology department for an image-guided cholangiogram with possible drain upsize/exchange.   Risks and benefits were discussed with the patient including, but not limited to, bleeding, infection, gallbladder perforation, bile leak, sepsis or even death.  All of the patient's questions were answered, patient is agreeable to proceed. He has been NPO. Last dose of Eliquis was today at 0116.   Consent signed and in chart.  Thank you for this interesting consult.  I greatly enjoyed meeting Robert Lang and look forward to participating in their care.  A copy of this report was sent to the requesting provider on this date.  Electronically Signed: Alwyn Ren, AGACNP-BC [phone] 10/06/2022, 8:36 AM   I spent a total of 20 Minutes    in face to face in clinical consultation, greater  than 50% of which was counseling/coordinating care for  cholangiogram with possible drain exchange.

## 2022-10-06 NOTE — Telephone Encounter (Signed)
Patient notified

## 2022-10-06 NOTE — Evaluation (Signed)
Physical Therapy Evaluation Patient Details Name: Robert Lang MRN: [id number] DOB: Jul 06, 1950 Today's Date: 10/06/2022  History of Present Illness  72 y.o. male who presents 10/05/22 with decreased output from his cholecystostomy tube, rigors, and right-sided abdominal pain.PMH significant of hypertension, hyperlipidemia, atrial fibrillation on anticoagulation, CAD, COPD, idiopathic pulmonary fibrosis, periodic limb movement disorder, anxiety, OSA., and cholecystitis status post cholecystostomy tube placement on 08/30/2022  Clinical Impression   Pt admitted secondary to problem above with deficits below. PTA patient was walking modified independent in home with walking stick (and uses either rollator or electric scooter outside home).  Pt currently requires min assist for bed to chair transfer and did not feel like he could walk due to wooziness from recent meds for IR procedure. Anticipate good progress as meds clear from his system.. Anticipate patient will benefit from PT to address problems listed below.Will continue to follow acutely to maximize functional mobility independence and safety.           Assistance Recommended at Discharge Intermittent Supervision/Assistance  If plan is discharge home, recommend the following:  Can travel by private vehicle  A little help with walking and/or transfers;Assistance with cooking/housework;Direct supervision/assist for medications management;Direct supervision/assist for financial management;Assist for transportation;Help with stairs or ramp for entrance        Equipment Recommendations None recommended by PT  Recommendations for Other Services  OT consult    Functional Status Assessment Patient has had a recent decline in their functional status and demonstrates the ability to make significant improvements in function in a reasonable and predictable amount of time.     Precautions / Restrictions Precautions Precautions: Fall       Mobility  Bed Mobility Overal bed mobility: Needs Assistance Bed Mobility: Rolling, Sidelying to Sit Rolling: Min assist Sidelying to sit: Min assist       General bed mobility comments: exit to his rt with incr rt sided pain requiring assist to raise torso to sitting    Transfers Overall transfer level: Needs assistance Equipment used: None Transfers: Sit to/from Stand, Bed to chair/wheelchair/BSC Sit to Stand: Min assist   Step pivot transfers: Min assist       General transfer comment: pt did not fully stand before beginning to pivot to his rt to chair; assist for balance; pt reports feeling woozy from pain medications    Ambulation/Gait               General Gait Details: unsafe at this time due to decr balance and "wooziness" from meds  Stairs            Wheelchair Mobility     Tilt Bed    Modified Rankin (Stroke Patients Only)       Balance Overall balance assessment: Needs assistance Sitting-balance support: No upper extremity supported, Feet unsupported Sitting balance-Leahy Scale: Good     Standing balance support: Single extremity supported Standing balance-Leahy Scale: Poor                               Pertinent Vitals/Pain Pain Assessment Pain Assessment: Faces Faces Pain Scale: Hurts even more Pain Location: rt flank/site of drain Pain Descriptors / Indicators: Aching Pain Intervention(s): Limited activity within patient's tolerance, Monitored during session, Repositioned    Home Living Family/patient expects to be discharged to:: Private residence Living Arrangements: Spouse/significant other Available Help at Discharge: Family Type of Home: House Home Access: Level entry  Alternate Level Stairs-Number of Steps: 6 Home Layout: Two level;Able to live on main level with bedroom/bathroom Home Equipment: Educational psychologist (4 wheels);Electric scooter;Cane - single point      Prior Function Prior Level  of Function : Independent/Modified Independent             Mobility Comments: pt has been using a walking stick for last 2-3 weeks and prior to that was not using anything in home. uses rollator for short ambulation around the block or to the mailbox. for longer distances in community he has an Art gallery manager. ADLs Comments: independent with dressing showering, pt states he can cook meals but needs to sit for breaks and prep work, his wife functions as the head chef most of the time but if he cooks she is his Biomedical scientist.     Hand Dominance   Dominant Hand: Right    Extremity/Trunk Assessment   Upper Extremity Assessment Upper Extremity Assessment: Defer to OT evaluation    Lower Extremity Assessment Lower Extremity Assessment: Overall WFL for tasks assessed    Cervical / Trunk Assessment Cervical / Trunk Assessment: Normal  Communication   Communication: No difficulties  Cognition Arousal/Alertness: Awake/alert Behavior During Therapy: WFL for tasks assessed/performed Overall Cognitive Status: No family/caregiver present to determine baseline cognitive functioning                                 General Comments: repeated some comments he had already told PT (I have a therapist Waynetta Sandy that comes out to the house...)        General Comments General comments (skin integrity, edema, etc.): on 2L [place] O2 with sats 94% HR 90s throughout    Exercises     Assessment/Plan    PT Assessment Patient needs continued PT services  PT Problem List Decreased activity tolerance;Decreased balance;Decreased mobility;Decreased cognition;Decreased knowledge of use of DME;Decreased safety awareness;Cardiopulmonary status limiting activity;Obesity;Pain       PT Treatment Interventions DME instruction;Gait training;Functional mobility training;Therapeutic activities;Therapeutic exercise;Balance training;Cognitive remediation;Patient/family education    PT Goals (Current goals  can be found in the Care Plan section)  Acute Rehab PT Goals Patient Stated Goal: return home soon PT Goal Formulation: With patient Time For Goal Achievement: 10/20/22 Potential to Achieve Goals: Good    Frequency Min 3X/week     Co-evaluation               AM-PAC PT "6 Clicks" Mobility  Outcome Measure Help needed turning from your back to your side while in a flat bed without using bedrails?: A Little Help needed moving from lying on your back to sitting on the side of a flat bed without using bedrails?: A Little Help needed moving to and from a bed to a chair (including a wheelchair)?: A Little Help needed standing up from a chair using your arms (e.g., wheelchair or bedside chair)?: A Little Help needed to walk in hospital room?: Total Help needed climbing 3-5 steps with a railing? : Total 6 Click Score: 14    End of Session Equipment Utilized During Treatment: Oxygen Activity Tolerance: Treatment limited secondary to medical complications (Comment) (woozy feeling) Patient left: in chair;with call bell/phone within reach;with chair alarm set Nurse Communication: Mobility status PT Visit Diagnosis: Unsteadiness on feet (R26.81);Other abnormalities of gait and mobility (R26.89)    Time: 1610-9604 PT Time Calculation (min) (ACUTE ONLY): 30 min   Charges:   PT Evaluation $  PT Eval Low Complexity: 1 Low PT Treatments $Therapeutic Activity: 8-22 mins PT General Charges $$ ACUTE PT VISIT: 1 Visit          Jerolyn Center, PT Acute Rehabilitation Services  Office [phone]   Zena Amos 10/06/2022, 11:06 AM

## 2022-10-06 NOTE — Evaluation (Signed)
Occupational Therapy Evaluation Patient Details Name: Robert Lang MRN: [id number] DOB: May 22, 1950 Today's Date: 10/06/2022   History of Present Illness 72 y.o. male who presents 10/05/22 with decreased output from his cholecystostomy tube, rigors, and right-sided abdominal pain.PMH significant of hypertension, hyperlipidemia, atrial fibrillation on anticoagulation, CAD, COPD, idiopathic pulmonary fibrosis, periodic limb movement disorder, anxiety, OSA., and cholecystitis status post cholecystostomy tube placement on 08/30/2022   Clinical Impression   At baseline, pt completes ADLs and functional mobility Independently to Mod I and drives. Pt now presents with decreased activity tolerance, decreased standing balance, generalized B UE weakness, and decreased safety and independence with ADLs and functional transfers/mobility. Pt currently demonstrates ability to complete UB ADLs with Supervision to Set up, LB ADLs with Min guard to Mod assist, and functional transfers with hand held assist +1 with Min assist. Pt will benefit from acute skilled OT services to address deficits outlined below, decrease caregiver burden, and increase safety and independence with ADLs, functional transfers, and functional mobility. Post acute discharge, pt will benefit from continued skilled OT services in the home to maximize rehab potential.      Recommendations for follow up therapy are one component of a multi-disciplinary discharge planning process, led by the attending physician.  Recommendations may be updated based on patient status, additional functional criteria and insurance authorization.   Assistance Recommended at Discharge Frequent or constant Supervision/Assistance  Patient can return home with the following A little help with walking and/or transfers;A little help with bathing/dressing/bathroom;Assistance with cooking/housework;Direct supervision/assist for medications management;Direct supervision/assist  for financial management;Assist for transportation;Help with stairs or ramp for entrance;Assistance with feeding (Set up for self feeding)    Functional Status Assessment  Patient has had a recent decline in their functional status and demonstrates the ability to make significant improvements in function in a reasonable and predictable amount of time.  Equipment Recommendations  None recommended by OT    Recommendations for Other Services       Precautions / Restrictions Precautions Precautions: Fall Restrictions Weight Bearing Restrictions: No      Mobility Bed Mobility               General bed mobility comments: Pt sitting in recliner at beginning and end of session.    Transfers Overall transfer level: Needs assistance Equipment used: None Transfers: Sit to/from Stand, Bed to chair/wheelchair/BSC Sit to Stand: Min assist     Step pivot transfers: Min assist     General transfer comment: Pt reports feeling "woozy" and "a bit out of it" secondary to pain medication.      Balance Overall balance assessment: Needs assistance Sitting-balance support: No upper extremity supported, Feet unsupported Sitting balance-Leahy Scale: Good     Standing balance support: Single extremity supported, During functional activity Standing balance-Leahy Scale: Poor Standing balance comment: Reliant on therapist for balance                           ADL either performed or assessed with clinical judgement   ADL Overall ADL's : Needs assistance/impaired Eating/Feeding: Set up;Sitting   Grooming: Set up;Sitting   Upper Body Bathing: Set up;Sitting Upper Body Bathing Details (indicate cue type and reason): with increased time and rest breaks Lower Body Bathing: Min guard;Sitting/lateral leans Lower Body Bathing Details (indicate cue type and reason): with increased time and rest breaks Upper Body Dressing : Supervision/safety;Sitting   Lower Body Dressing:  Minimal assistance;Sit to/from stand Lower Body Dressing Details (  indicate cue type and reason): with increased time and rest breaks Toilet Transfer: Minimal assistance;BSC/3in1 (step-pivot, hand-held assist +1)   Toileting- Clothing Manipulation and Hygiene: Moderate assistance;Sit to/from stand         General ADL Comments: Pt presents with decreased activity tolerance and requiring frequent rest breaks during functional tasks. Functional mobility deferred secondary to pt reports of feeling "woozy" this session.     Vision Baseline Vision/History: 1 Wears glasses (progressives) Ability to See in Adequate Light: 0 Adequate (with glasses) Patient Visual Report: No change from baseline       Perception     Praxis      Pertinent Vitals/Pain Pain Assessment Pain Assessment: Faces Faces Pain Scale: Hurts little more Pain Location: rt flank/site of drain Pain Descriptors / Indicators: Aching, Guarding, Grimacing, Discomfort Pain Intervention(s): Limited activity within patient's tolerance, Monitored during session, Repositioned     Hand Dominance Right   Extremity/Trunk Assessment Upper Extremity Assessment Upper Extremity Assessment: Generalized weakness   Lower Extremity Assessment Lower Extremity Assessment: Defer to PT evaluation   Cervical / Trunk Assessment Cervical / Trunk Assessment: Normal   Communication Communication Communication: No difficulties   Cognition Arousal/Alertness: Awake/alert Behavior During Therapy: WFL for tasks assessed/performed Overall Cognitive Status: No family/caregiver present to determine baseline cognitive functioning                                 General Comments: Pt AAOx4 and demonstrates ability to consistantly follow 1-step commands. Pt with noted decreased short-term memory and decreased problem solving ability.     General Comments  VSS on 2L continuous O2 through nasal cannula throughout session.     Exercises     Shoulder Instructions      Home Living Family/patient expects to be discharged to:: Private residence Living Arrangements: Spouse/significant other Available Help at Discharge: Family Type of Home: House Home Access: Level entry     Home Layout: Two level;Able to live on main level with bedroom/bathroom Alternate Level Stairs-Number of Steps: 6   Bathroom Shower/Tub: Producer, television/film/video: Handicapped height Bathroom Accessibility: Yes   Home Equipment: Educational psychologist (4 wheels);Cane - single point;Wheelchair - power;Wheelchair - manual (Pt reports power wheelchair folds up to transport in the car)          Prior Functioning/Environment Prior Level of Function : Independent/Modified Independent             Mobility Comments: pt has been using a walking stick for last 2-3 weeks and prior to that was not using anything in home. uses rollator for short ambulation around the block or to the mailbox. for longer distances in community he has a power wheelchair. ADLs Comments: Pt reports at baseline he is Independent with all ADLs, cooks, and drives. However, pt reports decreased activity tolerance and "feeling unsteady" over the past 2-3 weeks. Becuase of this pt's wife has been Supervising pt during ADLs and doing all the driving over past 2-3 weeks.        OT Problem List: Decreased strength;Decreased activity tolerance;Impaired balance (sitting and/or standing);Decreased safety awareness;Decreased knowledge of use of DME or AE      OT Treatment/Interventions: Self-care/ADL training;Therapeutic exercise;Energy conservation;DME and/or AE instruction;Therapeutic activities;Patient/family education;Balance training    OT Goals(Current goals can be found in the care plan section) Acute Rehab OT Goals Patient Stated Goal: To return home and participate in home health OT/PT OT Goal Formulation:  With patient Time For Goal Achievement:  10/20/22 Potential to Achieve Goals: Good ADL Goals Pt Will Perform Grooming: standing;with modified independence Pt Will Perform Lower Body Bathing: with modified independence;sit to/from stand Pt Will Perform Lower Body Dressing: with modified independence;sit to/from stand Pt Will Transfer to Toilet: with modified independence;ambulating;regular height toilet (with least restrictive AD) Pt Will Perform Toileting - Clothing Manipulation and hygiene: with modified independence;sit to/from stand Additional ADL Goal #1: Patient will demonstrate ability to Independently state 3 energy conservation strategies to increase safety and independence with ADLs and functional mobility in the home.  OT Frequency: Min 2X/week    Co-evaluation              AM-PAC OT "6 Clicks" Daily Activity     Outcome Measure Help from another person eating meals?: A Little Help from another person taking care of personal grooming?: A Little Help from another person toileting, which includes using toliet, bedpan, or urinal?: A Lot Help from another person bathing (including washing, rinsing, drying)?: A Little Help from another person to put on and taking off regular upper body clothing?: A Little Help from another person to put on and taking off regular lower body clothing?: A Little 6 Click Score: 17   End of Session Equipment Utilized During Treatment: Oxygen Nurse Communication: Mobility status  Activity Tolerance: Patient tolerated treatment well (Pt requiring occasional rest breaks but participated well.) Patient left: in chair;with call bell/phone within reach;with chair alarm set  OT Visit Diagnosis: Unsteadiness on feet (R26.81);Muscle weakness (generalized) (M62.81)                Time: 5784-6962 OT Time Calculation (min): 27 min Charges:  OT General Charges $OT Visit: 1 Visit OT Evaluation $OT Eval Low Complexity: 1 Low  Dennys Guin "Kyle" M., OTR/L, MA Acute Rehab [phone]   Lendon Colonel 10/06/2022, 1:45 PM

## 2022-10-06 NOTE — Progress Notes (Signed)
Pt currently out of room at this time.  Unable to give breathing treatment.

## 2022-10-06 NOTE — Progress Notes (Signed)
PHARMACY - PHYSICIAN COMMUNICATION CRITICAL VALUE ALERT - BLOOD CULTURE IDENTIFICATION (BCID)  Robert Lang is an 72 y.o. male who presented to [facility] on 10/05/2022 with a chief complaint of right abdominal pain, chills, decreased output from cholecystostomy tube.   Assessment:  72 year old male admitted with decreased output from cholecystostomy tube. Originally had cholecystitis with placement of cholecystostomy tube 08/30/22. Planning upsize of drain today. Now with klebsiella pneumoniae in 1/4 blood cultures likely secondary to GI source from chole tube.   Name of physician (or Provider) Contacted: P. Thedore Mins   Current antibiotics: cefepime/flagyl   Changes to prescribed antibiotics recommended:  Change cefepime to ceftriaxone Reasonable to leave flagyl for now    Results for orders placed or performed during the hospital encounter of 10/05/22  Blood Culture ID Panel (Reflexed) (Collected: 10/05/2022  4:00 PM)  Result Value Ref Range   Enterococcus faecalis NOT DETECTED NOT DETECTED   Enterococcus Faecium NOT DETECTED NOT DETECTED   Listeria monocytogenes NOT DETECTED NOT DETECTED   Staphylococcus species NOT DETECTED NOT DETECTED   Staphylococcus aureus (BCID) NOT DETECTED NOT DETECTED   Staphylococcus epidermidis NOT DETECTED NOT DETECTED   Staphylococcus lugdunensis NOT DETECTED NOT DETECTED   Streptococcus species NOT DETECTED NOT DETECTED   Streptococcus agalactiae NOT DETECTED NOT DETECTED   Streptococcus pneumoniae NOT DETECTED NOT DETECTED   Streptococcus pyogenes NOT DETECTED NOT DETECTED   A.calcoaceticus-baumannii NOT DETECTED NOT DETECTED   Bacteroides fragilis NOT DETECTED NOT DETECTED   Enterobacterales DETECTED (A) NOT DETECTED   Enterobacter cloacae complex NOT DETECTED NOT DETECTED   Escherichia coli NOT DETECTED NOT DETECTED   Klebsiella aerogenes NOT DETECTED NOT DETECTED   Klebsiella oxytoca NOT DETECTED NOT DETECTED   Klebsiella pneumoniae DETECTED  (A) NOT DETECTED   Proteus species NOT DETECTED NOT DETECTED   Salmonella species NOT DETECTED NOT DETECTED   Serratia marcescens NOT DETECTED NOT DETECTED   Haemophilus influenzae NOT DETECTED NOT DETECTED   Neisseria meningitidis NOT DETECTED NOT DETECTED   Pseudomonas aeruginosa NOT DETECTED NOT DETECTED   Stenotrophomonas maltophilia NOT DETECTED NOT DETECTED   Candida albicans NOT DETECTED NOT DETECTED   Candida auris NOT DETECTED NOT DETECTED   Candida glabrata NOT DETECTED NOT DETECTED   Candida krusei NOT DETECTED NOT DETECTED   Candida parapsilosis NOT DETECTED NOT DETECTED   Candida tropicalis NOT DETECTED NOT DETECTED   Cryptococcus neoformans/gattii NOT DETECTED NOT DETECTED   CTX-M ESBL NOT DETECTED NOT DETECTED   Carbapenem resistance IMP NOT DETECTED NOT DETECTED   Carbapenem resistance KPC NOT DETECTED NOT DETECTED   Carbapenem resistance NDM NOT DETECTED NOT DETECTED   Carbapenem resist OXA 48 LIKE NOT DETECTED NOT DETECTED   Carbapenem resistance VIM NOT DETECTED NOT DETECTED    Sharin Mons, PharmD, BCPS, BCIDP Infectious Diseases Clinical Pharmacist Phone: [phone] 10/06/2022  9:07 AM

## 2022-10-07 ENCOUNTER — Encounter (HOSPITAL_COMMUNITY): Payer: Self-pay

## 2022-10-07 DIAGNOSIS — Z86711 Personal history of pulmonary embolism: Secondary | ICD-10-CM | POA: Diagnosis not present

## 2022-10-07 DIAGNOSIS — I2510 Atherosclerotic heart disease of native coronary artery without angina pectoris: Secondary | ICD-10-CM | POA: Diagnosis not present

## 2022-10-07 LAB — CBC WITH DIFFERENTIAL/PLATELET
Abs Immature Granulocytes: 0.03 10*3/uL (ref 0.00–0.07)
Basophils Absolute: 0 10*3/uL (ref 0.0–0.1)
Basophils Relative: 0 %
Eosinophils Absolute: 0 10*3/uL (ref 0.0–0.5)
Eosinophils Relative: 0 %
HCT: 35.8 % — ABNORMAL LOW (ref 39.0–52.0)
Hemoglobin: 11.2 g/dL — ABNORMAL LOW (ref 13.0–17.0)
Immature Granulocytes: 0 %
Lymphocytes Relative: 24 %
Lymphs Abs: 1.9 10*3/uL (ref 0.7–4.0)
MCH: 30.2 pg (ref 26.0–34.0)
MCHC: 31.3 g/dL (ref 30.0–36.0)
MCV: 96.5 fL (ref 80.0–100.0)
Monocytes Absolute: 1.2 10*3/uL — ABNORMAL HIGH (ref 0.1–1.0)
Monocytes Relative: 14 %
Neutro Abs: 4.9 10*3/uL (ref 1.7–7.7)
Neutrophils Relative %: 62 %
Platelets: 159 10*3/uL (ref 150–400)
RBC: 3.71 MIL/uL — ABNORMAL LOW (ref 4.22–5.81)
RDW: 14.9 % (ref 11.5–15.5)
WBC: 8.1 10*3/uL (ref 4.0–10.5)
nRBC: 0 % (ref 0.0–0.2)

## 2022-10-07 LAB — COMPREHENSIVE METABOLIC PANEL
ALT: 16 U/L (ref 0–44)
AST: 14 U/L — ABNORMAL LOW (ref 15–41)
Albumin: 2.3 g/dL — ABNORMAL LOW (ref 3.5–5.0)
Alkaline Phosphatase: 52 U/L (ref 38–126)
Anion gap: 5 (ref 5–15)
BUN: 10 mg/dL (ref 8–23)
CO2: 25 mmol/L (ref 22–32)
Calcium: 8 mg/dL — ABNORMAL LOW (ref 8.9–10.3)
Chloride: 103 mmol/L (ref 98–111)
Creatinine, Ser: 0.91 mg/dL (ref 0.61–1.24)
GFR, Estimated: >60 mL/min (ref >60)
Glucose, Bld: 102 mg/dL — ABNORMAL HIGH (ref 70–99)
Potassium: 3.7 mmol/L (ref 3.5–5.1)
Sodium: 133 mmol/L — ABNORMAL LOW (ref 135–145)
Total Bilirubin: 0.7 mg/dL (ref 0.3–1.2)
Total Protein: 5.5 g/dL — ABNORMAL LOW (ref 6.5–8.1)

## 2022-10-07 LAB — PROCALCITONIN: Procalcitonin: 0.1 ng/mL

## 2022-10-07 LAB — C-REACTIVE PROTEIN: CRP: 17.2 mg/dL — ABNORMAL HIGH (ref <1.0)

## 2022-10-07 LAB — MAGNESIUM: Magnesium: 1.7 mg/dL (ref 1.7–2.4)

## 2022-10-07 LAB — BRAIN NATRIURETIC PEPTIDE: B Natriuretic Peptide: 309 pg/mL — ABNORMAL HIGH (ref 0.0–100.0)

## 2022-10-07 MED ORDER — SODIUM CHLORIDE 0.9% FLUSH
10.0000 mL | Freq: Two times a day (BID) | INTRAVENOUS | Status: DC
  Administered 2022-10-07 – 2022-10-09 (×5): 10 mL

## 2022-10-07 MED ORDER — APIXABAN 5 MG PO TABS
5.0000 mg | ORAL_TABLET | Freq: Two times a day (BID) | ORAL | Status: DC
  Administered 2022-10-07 – 2022-10-09 (×4): 5 mg via ORAL
  Filled 2022-10-07 (×4): qty 1

## 2022-10-07 MED ORDER — PIRFENIDONE 801 MG PO TABS
801.0000 mg | ORAL_TABLET | Freq: Three times a day (TID) | ORAL | Status: DC
  Administered 2022-10-07 – 2022-10-09 (×5): 801 mg via ORAL
  Filled 2022-10-07 (×9): qty 1

## 2022-10-07 MED ORDER — LACTATED RINGERS IV BOLUS
500.0000 mL | Freq: Once | INTRAVENOUS | Status: AC
  Administered 2022-10-07: 500 mL via INTRAVENOUS

## 2022-10-07 NOTE — Progress Notes (Signed)
Subjective: CC: RUQ abdominal pain improved. Tolerating soft diet without worsening of his symptoms or any vomiting. IR perc chole draining bilious output.    Objective: Vital signs in last 24 hours: Temp:  [99 F (37.2 C)-99.8 F (37.7 C)] 99.7 F (37.6 C) (07/18 0529) Pulse Rate:  [85-105] 94 (07/18 0800) Resp:  [17-25] 20 (07/18 0800) BP: (85-110)/(54-76) 107/64 (07/18 0800) SpO2:  [91 %-96 %] 93 % (07/18 0800)    Intake/Output from previous day: 07/17 0701 - 07/18 0700 In: -  Out: 860 [Urine:500; Drains:360] Intake/Output this shift: Total I/O In: 100 [IV Piggyback:100] Out: -   PE: Gen:  Alert, NAD, pleasant Card:  Reg Pulm:  CTAB, no W/R/R, effort normal Abd: Soft, ND, RUQ ttp, IR perc chole drain with bilious output - 360cc/24 hours.   Lab Results:  Recent Labs    10/06/22 0524 10/07/22 0443  WBC 9.9 8.1  HGB 12.1* 11.2*  HCT 37.0* 35.8*  PLT 171 159   BMET Recent Labs    10/06/22 0524 10/07/22 0443  NA 134* 133*  K 4.3 3.7  CL 103 103  CO2 21* 25  GLUCOSE 104* 102*  BUN 11 10  CREATININE 0.91 0.91  CALCIUM 8.0* 8.0*   PT/INR Recent Labs    10/05/22 1551 10/06/22 1001  LABPROT 15.4* 17.6*  INR 1.2 1.4*   CMP     Component Value Date/Time   NA 133 (L) 10/07/2022 0443   NA 137 03/05/2020 1343   K 3.7 10/07/2022 0443   CL 103 10/07/2022 0443   CO2 25 10/07/2022 0443   GLUCOSE 102 (H) 10/07/2022 0443   BUN 10 10/07/2022 0443   BUN 14 03/05/2020 1343   CREATININE 0.91 10/07/2022 0443   CREATININE 0.92 09/02/2021 1541   CALCIUM 8.0 (L) 10/07/2022 0443   PROT 5.5 (L) 10/07/2022 0443   PROT 6.9 04/20/2019 0807   ALBUMIN 2.3 (L) 10/07/2022 0443   ALBUMIN 4.3 04/20/2019 0807   AST 14 (L) 10/07/2022 0443   ALT 16 10/07/2022 0443   ALKPHOS 52 10/07/2022 0443   BILITOT 0.7 10/07/2022 0443   BILITOT 0.5 04/20/2019 0807   GFRNONAA >60 10/07/2022 0443   GFRAA 81 03/05/2020 1343   Lipase     Component Value Date/Time    LIPASE 49 09/17/2022 0122    Studies/Results: IR EXCHANGE BILIARY DRAIN  Result Date: 10/06/2022 INDICATION: Rigors, right abdominal pain, and decreased output from cholecystostomy tube EXAM: Exchange of cholecystostomy drainage catheter using fluoroscopic guidance MEDICATIONS: Documented in the EMR ANESTHESIA/SEDATION: Moderate (conscious) sedation was employed during this procedure. A total of Versed 1 mg and Fentanyl 100 mcg was administered intravenously. Moderate Sedation Time: 10 minutes. The patient's level of consciousness and vital signs were monitored continuously by radiology nursing throughout the procedure under my direct supervision. FLUOROSCOPY TIME:  Fluoroscopy Time: 0.5 minutes (3 mGy) COMPLICATIONS: None immediate. PROCEDURE: Informed written consent was obtained from the patient after a thorough discussion of the procedural risks, benefits and alternatives. All questions were addressed. Maximal Sterile Barrier Technique was utilized including caps, mask, sterile gowns, sterile gloves, sterile drape, hand hygiene and skin antiseptic. A timeout was performed prior to the initiation of the procedure. Patient was placed supine on the exam table. The right upper quadrant was prepped and draped in the standard sterile fashion with inclusion of the existing cholecystostomy drainage catheter within the sterile field. Injection of the existing cholecystostomy drainage catheter demonstrated appropriate location within the gallbladder  lumen. Locking loop was released, and over an Amplatz wire, the existing cholecystostomy drainage catheter was exchanged for a new 12 French locking drainage catheter. Locking loop was formed, and location was confirmed with injection of contrast material opacifying the gallbladder lumen. Given concern for infection at this time, a dedicated cholangiogram and attempt to opacify the cystic duct was not attempted. The drainage catheter was attached to bag drainage. It was  secured to the skin using silk suture and a dressing. The patient tolerated the procedure well without immediate complication. IMPRESSION: 1. Successful exchange and upsize of cholecystostomy drainage catheter for a new 12 French cholecystostomy tube. Cholecystostomy tube attached to bag drainage. 2. Recommend continued flushing 2-3 times daily. 3. Additional follow-up per surgery for cholecystectomy candidacy. Electronically Signed   By: Olive Bass M.D.   On: 10/06/2022 12:31   CT ABDOMEN PELVIS W CONTRAST  Result Date: 10/05/2022 CLINICAL DATA:  Abdominal pain. EXAM: CT ABDOMEN AND PELVIS WITH CONTRAST TECHNIQUE: Multidetector CT imaging of the abdomen and pelvis was performed using the standard protocol following bolus administration of intravenous contrast. RADIATION DOSE REDUCTION: This exam was performed according to the departmental dose-optimization program which includes automated exposure control, adjustment of the mA and/or kV according to patient size and/or use of iterative reconstruction technique. CONTRAST:  OMNIPAQUE IOHEXOL 300 MG/ML  SOLN COMPARISON:  CT abdomen pelvis dated September 16, 2022. FINDINGS: Lower chest: No acute abnormality.  Bibasilar atelectasis. Hepatobiliary: Unchanged small hemangioma in the right liver. No new focal liver abnormality. Percutaneous cholecystostomy tube remains in place. Slightly increased distention of the gallbladder compared to prior study. Unchanged mild pericholecystic inflammatory change. No biliary dilatation. Pancreas: Unremarkable. No pancreatic ductal dilatation or surrounding inflammatory changes. Spleen: Normal in size without focal abnormality. Adrenals/Urinary Tract: Adrenal glands are unremarkable. Kidneys are normal, without renal calculi, focal lesion, or hydronephrosis. Bladder is unremarkable. Stomach/Bowel: Stomach is within normal limits. Prior appendectomy. No evidence of bowel wall thickening, distention, or inflammatory changes.  Left-sided colonic diverticulosis. Vascular/Lymphatic: Aortic atherosclerosis. No enlarged abdominal or pelvic lymph nodes. Reproductive: Prostate is unremarkable. Other: No abdominal wall hernia or abnormality. No abdominopelvic ascites. No pneumoperitoneum. Musculoskeletal: No acute or significant osseous findings. IMPRESSION: 1. Percutaneous cholecystostomy tube remains in place. Slightly increased distention of the gallbladder compared to prior study with similar degree of mild pericholecystic inflammatory change. Correlate with tube function. 2.  Aortic Atherosclerosis (ICD10-I70.0). Electronically Signed   By: Obie Dredge M.D.   On: 10/05/2022 17:55   DG Chest Portable 1 View  Result Date: 10/05/2022 CLINICAL DATA:  Provided history: Infection. EXAM: PORTABLE CHEST 1 VIEW COMPARISON:  Chest CT 09/16/2022. Prior chest radiographs 09/16/2022 and earlier. FINDINGS: The cardiomediastinal silhouette is unchanged. Aortic atherosclerosis. Linear opacities within the right lung base compatible with atelectasis and/or scarring. No appreciable airspace consolidation. No evidence of pleural effusion or pneumothorax. No acute osseous abnormality identified. IMPRESSION: 1. No evidence of airspace consolidation. 2. Atelectasis and/or scarring within the right lung base. 3. Aortic Atherosclerosis (ICD10-I70.0). Electronically Signed   By: Jackey Loge D.O.   On: 10/05/2022 16:15    Anti-infectives: Anti-infectives (From admission, onward)    Start     Dose/Rate Route Frequency Ordered Stop   10/06/22 1030  cefTRIAXone (ROCEPHIN) 2 g in sodium chloride 0.9 % 100 mL IVPB        2 g 200 mL/hr over 30 Minutes Intravenous Every 24 hours 10/06/22 0917     10/06/22 0600  metroNIDAZOLE (FLAGYL) IVPB 500 mg  500 mg 100 mL/hr over 60 Minutes Intravenous 2 times daily 10/06/22 0024 10/13/22 0959   10/06/22 0200  ceFEPIme (MAXIPIME) 2 g in sodium chloride 0.9 % 100 mL IVPB  Status:  Discontinued        2  g 200 mL/hr over 30 Minutes Intravenous Every 8 hours 10/06/22 0036 10/06/22 0917   10/05/22 1600  cefTRIAXone (ROCEPHIN) 2 g in sodium chloride 0.9 % 100 mL IVPB        2 g 200 mL/hr over 30 Minutes Intravenous  Once 10/05/22 1551 10/05/22 1658   10/05/22 1600  metroNIDAZOLE (FLAGYL) IVPB 500 mg        500 mg 100 mL/hr over 60 Minutes Intravenous  Once 10/05/22 1551 10/05/22 1837        Assessment/Plan RUQ abdominal pain  Hx of Perc Chole 6/8 for Acute Cholecystitis Patient with hx of perc chole 6/8 for Acute Cholecystitis. He presented with no drain output for 3 days, RUQ abdominal pain and was found to have fever to 102, tachycardia and soft BP's. CT showed perc cholecystostomy tube in place with increased distension of the gallbladder compared ot prior study with degree of mild pericholecystic inflammatory changes. Lactic 3.3. > 1.6. WBC wnl. LFT's non-elevated. Blood cx w/ Enterobacterales and Kleb PNA noted. Started on Rocephin/Flagyl.  - He is now s/p IR perc chole exchange (no cholangiogram was performed). Drain currently appears to be draining adequately and patients pain has improved. He is tolerating a diet and vital signs have improved (no current fever, tachycardia or hypotension). Discussed case with MD. No plans for surgery during admission. He should plan to f/u with Dr. Sheliah Hatch next week as scheduled. He has already received pre-op eval by Pulm during his last admission. It would be beneficial for him to see Cardiology for risk stratification before his appointment with Dr. Sheliah Hatch but I don't think he needs to stay in the hospital for this.  - Defer abx selection and duration to [facility] given findings of bacteremia.  - Drain per IR.  - We will sign off. Please call back with any questions or concerns.   I reviewed nursing notes, ED provider notes, Consultant (IR) notes, hospitalist notes, last 24 h vitals and pain scores, last 48 h intake and output, last 24 h labs and trends,  and last 24 h imaging results.   LOS: 2 days    Jacinto Halim , [facility] 10/07/2022, 10:56 AM Please see Amion for pager number during day hours 7:00am-4:30pm

## 2022-10-07 NOTE — Plan of Care (Signed)

## 2022-10-07 NOTE — Progress Notes (Signed)
Physical Therapy Treatment Patient Details Name: Robert Lang MRN: [id number] DOB: [birth year] Today's Date: 10/07/2022   History of Present Illness 72 y.o. male who presents 10/05/22 with decreased output from his cholecystostomy tube, rigors, and right-sided abdominal pain.PMH significant of hypertension, hyperlipidemia, atrial fibrillation on anticoagulation, CAD, COPD, idiopathic pulmonary fibrosis, periodic limb movement disorder, anxiety, OSA., and cholecystitis status post cholecystostomy tube placement on 08/30/2022    PT Comments  Pt is progressing with mobility. Pt did not report wooziness this session but became less responsive when asked questions and was conversing less; after resting pt would return to previous level of responsiveness and conversation. RN was notified of O2 dropping during gait on 2L O2 via [place].  Pt is requiring supervision to CGA for all functional activities at this time. Spouse was present and supportive throughout session and will be at home with pt. Due to pt current functional status, home set up and PLOF recommend skilled physical therapy services at a decreased frequency on discharge from acute care hospital setting at 3x/week in order to return to PLOF with initial 24/7 supervision for safety due to high risk for falls.  Recommend RW on discharge due to pt has rollator and this would be unsafe to use at pt current functional status.     Assistance Recommended at Discharge Intermittent Supervision/Assistance  If plan is discharge home, recommend the following:  Can travel by private vehicle    A little help with walking and/or transfers;Assistance with cooking/housework;Assist for transportation;Help with stairs or ramp for entrance      Equipment Recommendations  Rolling walker (2 wheels)    Recommendations for Other Services       Precautions / Restrictions Precautions Precautions: Fall Restrictions Weight Bearing Restrictions: No      Mobility  Bed Mobility               General bed mobility comments: Pt sitting in recliner at beginning of session and sitting EOB at end of session on pt request with spouse in room. Patient Response: Cooperative  Transfers Overall transfer level: Needs assistance Equipment used: None, Rolling walker (2 wheels) Transfers: Sit to/from Stand Sit to Stand: Min guard           General transfer comment: Pt spouse stated pt looked pale. Pt states he feels okay, no dizziness, lightheadedness. No change in color from sitting to standing. Pt stood initially without AD with slight unsteadiness on his feet which improved with RW.    Ambulation/Gait Ambulation/Gait assistance: Min guard Gait Distance (Feet): 75 Feet (50) Assistive device: Rolling walker (2 wheels) Gait Pattern/deviations: Wide base of support, Decreased stride length, Decreased dorsiflexion - left, Decreased dorsiflexion - right Gait velocity: decreased cadence. Gait velocity interpretation: <1.31 ft/sec, indicative of household ambulator   General Gait Details: initially pt HR rose to 106 bpm with first attempt at gait on 2L O2 via [place]. Pt reported that he felt lightheaded and was not answering questions appropriately and not conversing. Returned to sit EOB. Attempted second attempt at gait with pt educated to perform purse lipped breathing, pt did well with PLB but O2 sats dropped to 79% after even shorter distance of gait. Returned to sitting EOB; Pt recovered with incr O2 via [place] and rest. RN notified.    Tilt Bed Tilt Bed Patient Response: Cooperative  Modified Rankin (Stroke Patients Only)       Balance Overall balance assessment: Needs assistance Sitting-balance support: No upper extremity supported, Feet unsupported Sitting balance-Leahy Scale: Good  Standing balance support: Single extremity supported, During functional activity, Bilateral upper extremity supported Standing balance-Leahy Scale:  Fair Standing balance comment: Pt became lightheaded and was not responding well or conversing while walking, no overt LOB          Cognition Arousal/Alertness: Awake/alert Behavior During Therapy: WFL for tasks assessed/performed Overall Cognitive Status: Within Functional Limits for tasks assessed             General Comments General comments (skin integrity, edema, etc.): pt was on 2L o2 via [place]. O2 sats at 91-92% on 2L with first attempt at gait, HR up to 106 bpm. Pt started not resonding well to questions of how he felt but kept walking, he was not conversing, pt had to be told to stop activity and return to EOB to sit. Pt then became more talkative and answering questions; attempted second attempt at walking after education with PLB and Hr remained ~85bpm but O2 sats decreased to 79%. O2 up to 4 LPM and pt O2 sats at 82%. After sitting O2 sats increased to 90% and pt again became more talkative and answering questions appropriately.      Pertinent Vitals/Pain Pain Assessment Pain Assessment: 0-10 Pain Score: 2  Pain Location: rt flank/site of drain Pain Descriptors / Indicators: Aching, Discomfort Pain Intervention(s): Limited activity within patient's tolerance     PT Goals (current goals can now be found in the care plan section) Acute Rehab PT Goals Patient Stated Goal: return home soon PT Goal Formulation: With patient Time For Goal Achievement: 10/20/22 Potential to Achieve Goals: Good Progress towards PT goals: Progressing toward goals    Frequency    Min 3X/week      PT Plan Current plan remains appropriate       AM-PAC PT "6 Clicks" Mobility   Outcome Measure  Help needed turning from your back to your side while in a flat bed without using bedrails?: A Little Help needed moving from lying on your back to sitting on the side of a flat bed without using bedrails?: A Little Help needed moving to and from a bed to a chair (including a wheelchair)?: A  Little Help needed standing up from a chair using your arms (e.g., wheelchair or bedside chair)?: A Little Help needed to walk in hospital room?: A Little Help needed climbing 3-5 steps with a railing? : A Little 6 Click Score: 18    End of Session Equipment Utilized During Treatment: Oxygen;Gait belt Activity Tolerance: Treatment limited secondary to medical complications (Comment) Patient left: in bed;with call bell/phone within reach;with family/visitor present Nurse Communication: Mobility status PT Visit Diagnosis: Unsteadiness on feet (R26.81);Other abnormalities of gait and mobility (R26.89)     Time: 1610-9604 PT Time Calculation (min) (ACUTE ONLY): 36 min  Charges:    $Gait Training: 8-22 mins $Therapeutic Activity: 8-22 mins PT General Charges $$ ACUTE PT VISIT: 1 Visit                     Harrel Carina, DPT, CLT  Acute Rehabilitation Services Office: [phone] (Secure chat preferred)    Claudia Desanctis 10/07/2022, 1:32 PM

## 2022-10-07 NOTE — Progress Notes (Signed)
PROGRESS NOTE                                                                                                                                                                                                             Patient Demographics:    Robert Lang, is a 72 y.o. male, DOB - Aug 23, 1950, OHY:[id number]  Outpatient Primary MD for the patient is [name], [name]    LOS - 2  Admit date - 10/05/2022    Chief Complaint  Patient presents with   Abdominal Pain       Brief Narrative (HPI from H&P)   72 y.o. male with medical history significant for IPF, COPD, OSA, CAD, hypertension, depression, anxiety, PAF on Eliquis, history of PE, and cholecystitis status post cholecystostomy tube placement 1st week of June, it stopped draining around the June 10th was re hospitalized and tube was revised, the last few days drain stopped draining again with + pain & fevers - came to the ER with CT showing GB distention admitted for drain malfunction.   Subjective:    [name] today has, No headache, No chest pain,+ve RUQ abdominal pain - No Nausea, No new weakness tingling or numbness, no SOB   Assessment  & Plan :    1. Sepsis; right abdominal pain, decreased cholecystostomy tube output - appears to have Klebsiella bacteremia, currently on combination of IV Rocephin and Flagyl ( stop flagyl 10/07/22) , as above he has had cholecystitis for close to 5 weeks, he underwent cholecystostomy drain placement first week of June since then this is the second malfunction.  Drain repositioned by IR on 10/06/2022, much improved, also seen by general surgery who will do outpatient follow-up for cholecystectomy.  Prior to surgery must follow-up with his pulmonologist and cardiologist for evaluation.  Clinically much improved, follow final culture sensitivity data.   2. COPD; IPF; OSA  - Not in exacerbation on admission  - Continue  ICS-LAMA-LABA, pefinidone, and CPAP at bedtime , on 2 lits o2 for now in daytime.   3. PAF  - Continue Eliquis    4. CAD  - No anginal symptoms  - Continue Lipitor, Ranexa     5. Depression, anxiety  - Continue home regimen    6.  Recent HX of PE.  Eliquis        Condition -  Extremely Guarded  Family Communication  : Wife [name] [phone]  on 10/06/2022 at 10:43 AM  Code Status : Full code  Consults  : IR  PUD Prophylaxis : PPI   Procedures  :     IR consulted for cholecystostomy drain revision on 10/06/2022.    CT - 1. Percutaneous cholecystostomy tube remains in place. Slightly increased distention of the gallbladder compared to prior study with similar degree of mild pericholecystic inflammatory change. Correlate with tube function. 2.  Aortic Atherosclerosis      Disposition Plan  :    Status is: Inpatient  DVT Prophylaxis  :     Lab Results  Component Value Date   PLT 159 10/07/2022    Diet :  Diet Order             DIET SOFT Room service appropriate? Yes; Fluid consistency: Thin  Diet effective now                    Inpatient Medications  Scheduled Meds:  atorvastatin  80 mg Oral Daily   buPROPion  300 mg Oral Daily   busPIRone  15 mg Oral TID   enoxaparin (LOVENOX) injection  1 mg/kg Subcutaneous Q12H   finasteride  5 mg Oral Daily   lamoTRIgine  100 mg Oral Daily   pantoprazole  40 mg Oral Daily   Pirfenidone  801 mg Oral TID   pregabalin  150 mg Oral BID   ranolazine  1,000 mg Oral BID   sertraline  150 mg Oral QHS   sodium chloride flush  10 mL Intracatheter Q12H   sodium chloride flush  3 mL Intravenous Q12H   umeclidinium-vilanterol  1 puff Inhalation Daily   Continuous Infusions:  cefTRIAXone (ROCEPHIN)  IV 2 g (10/07/22 0929)   metronidazole 500 mg (10/07/22 1049)   PRN Meds:.acetaminophen **OR** acetaminophen, LORazepam, ondansetron **OR** ondansetron (ZOFRAN) IV, oxyCODONE, senna-docusate, traZODone    Objective:    Vitals:   10/07/22 0000 10/07/22 0400 10/07/22 0529 10/07/22 0800  BP: 108/60 (!) 85/73 108/62 107/64  Pulse: 96 96 90 94  Resp: 17 19 (!) 21 20  Temp: 99 F (37.2 C)  99.7 F (37.6 C)   TempSrc: Oral  Oral Oral  SpO2: 95% 92% 94% 93%  Weight:        Wt Readings from Last 3 Encounters:  10/05/22 90.3 kg  09/18/22 90.3 kg  09/16/22 89.7 kg     Intake/Output Summary (Last 24 hours) at 10/07/2022 1107 Last data filed at 10/07/2022 0711 Gross per 24 hour  Intake 100 ml  Output 660 ml  Net -560 ml     Physical Exam  Awake Alert, No new F.N deficits, Normal affect [place].AT,PERRAL Supple Neck, No JVD,   Symmetrical Chest wall movement, Good air movement bilaterally, CTAB RRR,No Gallops,Rubs or new Murmurs,  +ve B.Sounds, Abd Soft, right upper quadrant tenderness, right upper quadrant cholecystostomy drain in place No Cyanosis, Clubbing or edema       Data Review:    Recent Labs  Lab 10/05/22 1538 10/06/22 0524 10/07/22 0443  WBC 13.2* 9.9 8.1  HGB 15.1 12.1* 11.2*  HCT 45.5 37.0* 35.8*  PLT 252 171 159  MCV 92.3 93.4 96.5  MCH 30.6 30.6 30.2  MCHC 33.2 32.7 31.3  RDW 14.6 14.7 14.9  LYMPHSABS 1.3  --  1.9  MONOABS 1.6*  --  1.2*  EOSABS 0.0  --  0.0  BASOSABS 0.0  --  0.0  Recent Labs  Lab 10/05/22 1538 10/05/22 1551 10/05/22 1811 10/05/22 1958 10/06/22 0520 10/06/22 0524 10/06/22 1001 10/07/22 0443  NA 133*  --   --   --   --  134*  --  133*  K 4.1  --   --   --   --  4.3  --  3.7  CL 101  --   --   --   --  103  --  103  CO2 22  --   --   --   --  21*  --  25  ANIONGAP 10  --   --   --   --  10  --  5  GLUCOSE 108*  --   --   --   --  104*  --  102*  BUN 12  --   --   --   --  11  --  10  CREATININE 0.91  --   --   --   --  0.91  --  0.91  AST 21  --   --   --   --  21  --  14*  ALT 23  --   --   --   --  23  --  16  ALKPHOS 78  --   --   --   --  62  --  52  BILITOT 0.5  --   --   --   --  0.5  --  0.7  ALBUMIN 3.5  --   --   --   --   2.5*  --  2.3*  CRP  --   --   --   --  8.4*  --   --  17.2*  PROCALCITON  --   --   --   --   --  0.20  --  0.10  LATICACIDVEN 2.5*  --  3.3* 1.6  --   --   --   --   INR  --  1.2  --   --   --   --  1.4*  --   BNP  --   --   --   --   --   --   --  309.0*  MG  --   --   --   --   --  1.6*  --  1.7  CALCIUM 8.7*  --   --   --   --  8.0*  --  8.0*      Recent Labs  Lab 10/05/22 1538 10/05/22 1551 10/05/22 1811 10/05/22 1958 10/06/22 0520 10/06/22 0524 10/06/22 1001 10/07/22 0443  CRP  --   --   --   --  8.4*  --   --  17.2*  PROCALCITON  --   --   --   --   --  0.20  --  0.10  LATICACIDVEN 2.5*  --  3.3* 1.6  --   --   --   --   INR  --  1.2  --   --   --   --  1.4*  --   BNP  --   --   --   --   --   --   --  309.0*  MG  --   --   --   --   --  1.6*  --  1.7  CALCIUM 8.7*  --   --   --   --  8.0*  --  8.0*     Radiology Reports IR EXCHANGE BILIARY DRAIN  Result Date: 10/06/2022 INDICATION: Rigors, right abdominal pain, and decreased output from cholecystostomy tube EXAM: Exchange of cholecystostomy drainage catheter using fluoroscopic guidance MEDICATIONS: Documented in the EMR ANESTHESIA/SEDATION: Moderate (conscious) sedation was employed during this procedure. A total of Versed 1 mg and Fentanyl 100 mcg was administered intravenously. Moderate Sedation Time: 10 minutes. The patient's level of consciousness and vital signs were monitored continuously by radiology nursing throughout the procedure under my direct supervision. FLUOROSCOPY TIME:  Fluoroscopy Time: 0.5 minutes (3 mGy) COMPLICATIONS: None immediate. PROCEDURE: Informed written consent was obtained from the patient after a thorough discussion of the procedural risks, benefits and alternatives. All questions were addressed. Maximal Sterile Barrier Technique was utilized including caps, mask, sterile gowns, sterile gloves, sterile drape, hand hygiene and skin antiseptic. A timeout was performed prior to the initiation of  the procedure. Patient was placed supine on the exam table. The right upper quadrant was prepped and draped in the standard sterile fashion with inclusion of the existing cholecystostomy drainage catheter within the sterile field. Injection of the existing cholecystostomy drainage catheter demonstrated appropriate location within the gallbladder lumen. Locking loop was released, and over an Amplatz wire, the existing cholecystostomy drainage catheter was exchanged for a new 12 French locking drainage catheter. Locking loop was formed, and location was confirmed with injection of contrast material opacifying the gallbladder lumen. Given concern for infection at this time, a dedicated cholangiogram and attempt to opacify the cystic duct was not attempted. The drainage catheter was attached to bag drainage. It was secured to the skin using silk suture and a dressing. The patient tolerated the procedure well without immediate complication. IMPRESSION: 1. Successful exchange and upsize of cholecystostomy drainage catheter for a new 12 French cholecystostomy tube. Cholecystostomy tube attached to bag drainage. 2. Recommend continued flushing 2-3 times daily. 3. Additional follow-up per surgery for cholecystectomy candidacy. Electronically Signed   By: Olive Bass M.D.   On: 10/06/2022 12:31   CT ABDOMEN PELVIS W CONTRAST  Result Date: 10/05/2022 CLINICAL DATA:  Abdominal pain. EXAM: CT ABDOMEN AND PELVIS WITH CONTRAST TECHNIQUE: Multidetector CT imaging of the abdomen and pelvis was performed using the standard protocol following bolus administration of intravenous contrast. RADIATION DOSE REDUCTION: This exam was performed according to the departmental dose-optimization program which includes automated exposure control, adjustment of the mA and/or kV according to patient size and/or use of iterative reconstruction technique. CONTRAST:  OMNIPAQUE IOHEXOL 300 MG/ML  SOLN COMPARISON:  CT abdomen pelvis dated  September 16, 2022. FINDINGS: Lower chest: No acute abnormality.  Bibasilar atelectasis. Hepatobiliary: Unchanged small hemangioma in the right liver. No new focal liver abnormality. Percutaneous cholecystostomy tube remains in place. Slightly increased distention of the gallbladder compared to prior study. Unchanged mild pericholecystic inflammatory change. No biliary dilatation. Pancreas: Unremarkable. No pancreatic ductal dilatation or surrounding inflammatory changes. Spleen: Normal in size without focal abnormality. Adrenals/Urinary Tract: Adrenal glands are unremarkable. Kidneys are normal, without renal calculi, focal lesion, or hydronephrosis. Bladder is unremarkable. Stomach/Bowel: Stomach is within normal limits. Prior appendectomy. No evidence of bowel wall thickening, distention, or inflammatory changes. Left-sided colonic diverticulosis. Vascular/Lymphatic: Aortic atherosclerosis. No enlarged abdominal or pelvic lymph nodes. Reproductive: Prostate is unremarkable. Other: No abdominal wall hernia or abnormality. No abdominopelvic ascites. No pneumoperitoneum. Musculoskeletal: No acute or significant osseous findings. IMPRESSION: 1. Percutaneous cholecystostomy tube remains in place. Slightly increased distention of the gallbladder compared to prior study  with similar degree of mild pericholecystic inflammatory change. Correlate with tube function. 2.  Aortic Atherosclerosis (ICD10-I70.0). Electronically Signed   By: Obie Dredge M.D.   On: 10/05/2022 17:55   DG Chest Portable 1 View  Result Date: 10/05/2022 CLINICAL DATA:  Provided history: Infection. EXAM: PORTABLE CHEST 1 VIEW COMPARISON:  Chest CT 09/16/2022. Prior chest radiographs 09/16/2022 and earlier. FINDINGS: The cardiomediastinal silhouette is unchanged. Aortic atherosclerosis. Linear opacities within the right lung base compatible with atelectasis and/or scarring. No appreciable airspace consolidation. No evidence of pleural effusion or  pneumothorax. No acute osseous abnormality identified. IMPRESSION: 1. No evidence of airspace consolidation. 2. Atelectasis and/or scarring within the right lung base. 3. Aortic Atherosclerosis (ICD10-I70.0). Electronically Signed   By: Jackey Loge D.O.   On: 10/05/2022 16:15      Signature  -   Susa Raring M.D on 10/07/2022 at 11:07 AM   -  To page go to [email or web address]

## 2022-10-07 NOTE — Care Management Important Message (Signed)
Important Message  Patient Details  Name: Robert Lang MRN: [id number] Date of Birth: [birth year]   Medicare Important Message Given:  Yes     Andras Grunewald Stefan Church 10/07/2022, 3:14 PM

## 2022-10-07 NOTE — Progress Notes (Signed)
Referring Physician(s): Odie Sera, MD  Supervising Physician: Richarda Overlie  Patient Status:  [facility] - In-pt  Chief Complaint:  Percutaneous cholecystostomy drain placed 08/30/22 with exchange and upsize on 10/06/22  Subjective:  Patient alert and lying in bed at time of exam. He is in good spirits and reports no pain at drain site.  Allergies: Naproxen and Silodosin  Medications: Prior to Admission medications   Medication Sig Start Date End Date Taking? Authorizing Provider  apixaban (ELIQUIS) 5 MG TABS tablet Take 1 tablet (5 mg total) by mouth 2 (two) times daily. 09/18/22  Yes Regalado, Belkys A, MD  atorvastatin (LIPITOR) 80 MG tablet Take 1 tablet (80 mg total) by mouth daily. Patient taking differently: Take 80 mg by mouth every evening. 04/28/22  Yes Georgeanna Lea, MD  buPROPion (WELLBUTRIN XL) 300 MG 24 hr tablet Take 1 tablet (300 mg total) by mouth daily. 08/20/22  Yes White, Arlys John A, NP  busPIRone (BUSPAR) 15 MG tablet Take 1 tablet (15 mg total) by mouth 3 (three) times daily. 08/20/22  Yes White, Watt Climes, NP  Cholecalciferol (VITAMIN D3) 25 MCG (1000 UT) CAPS Take 2 capsules by mouth 2 (two) times daily.   Yes [provider]  docusate sodium (COLACE) 100 MG capsule Take 100 mg by mouth daily.   Yes [provider]  famotidine (PEPCID) 40 MG tablet TAKE 1 TABLET(40 MG) BY MOUTH AT BEDTIME Patient taking differently: Take 40 mg by mouth at bedtime. 08/11/22  Yes Georgeanna Lea, MD  fexofenadine (ALLEGRA) 180 MG tablet Take 180 mg by mouth at bedtime.  04/09/08  Yes [provider]  finasteride (PROSCAR) 5 MG tablet Take 1 tablet (5 mg total) by mouth daily. 03/10/21  Yes Georgeanna Lea, MD  guaiFENesin (MUCINEX) 600 MG 12 hr tablet Take 2 tablets (1,200 mg total) by mouth 2 (two) times daily. 09/18/22 10/18/22 Yes Regalado, Belkys A, MD  lamoTRIgine (LAMICTAL) 100 MG tablet Take 1 tablet (100 mg total) by mouth daily. Patient taking  differently: Take 100 mg by mouth every evening. 09/14/22  Yes White, Watt Climes, NP  levalbuterol (XOPENEX) 1.25 MG/0.5ML nebulizer solution Take 1.25 mg by nebulization every 4 (four) hours as needed for wheezing or shortness of breath. 09/18/22  Yes Regalado, Belkys A, MD  LORazepam (ATIVAN) 0.5 MG tablet Take one tablet by mouth only for severe anxiety or agitation Patient taking differently: Take 0.5 mg by mouth daily as needed for anxiety. Take one tablet by mouth only for severe anxiety or agitation. Has not taking medication yet. 08/20/22  Yes White, Arlys John A, NP  Melatonin 10 MG TABS Take 1 tablet by mouth at bedtime.   Yes [provider]  Multiple Vitamins-Minerals (PRESERVISION AREDS PO) Take 1 capsule by mouth in the morning and at bedtime.   Yes [provider]  nitroGLYCERIN (NITROSTAT) 0.4 MG SL tablet Place 1 tablet (0.4 mg total) under the tongue every 5 (five) minutes as needed for chest pain. 03/10/21  Yes Georgeanna Lea, MD  ondansetron (ZOFRAN) 4 MG tablet Take 1 tablet (4 mg total) by mouth every 8 (eight) hours as needed for nausea or vomiting. 09/08/22  Yes [name], [name], [name]  pantoprazole (PROTONIX) 40 MG tablet TAKE 1 TABLET(40 MG) BY MOUTH DAILY Patient taking differently: Take 40 mg by mouth daily. 07/01/22  Yes Lynann Bologna, MD  Pirfenidone (ESBRIET) 801 MG TABS Take 1 tablet (801 mg total) by mouth 3 (three) times daily. 06/24/22  Yes Kalman Shan, MD  Polyethylene Glycol 3350 (MIRALAX PO) Take 17 g by mouth daily.   Yes [provider]  pregabalin (LYRICA) 150 MG capsule Take 1 capsule (150 mg total) by mouth 2 (two) times daily. 07/15/22  Yes Windell Norfolk, MD  ranolazine (RANEXA) 1000 MG SR tablet Take 1 tablet (1,000 mg total) by mouth 2 (two) times daily. 05/27/22  Yes Georgeanna Lea, MD  sertraline (ZOLOFT) 100 MG tablet Take 1.5 tablets (150 mg total) by mouth at bedtime. 06/28/22  Yes White, Arlys John A, NP  Sodium Chloride Flush  (NORMAL SALINE FLUSH) 0.9 % SOLN Flush drain with 5 mLs daily. Discard remaining 5ml in each syringe. 09/01/22  Yes Burnadette Pop, MD  traZODone (DESYREL) 50 MG tablet Take 1 tablet (50 mg total) by mouth at bedtime as needed for sleep (May take 1/2 tab to 1 tablet for sleep, as needed.). 06/28/22  Yes White, Brian A, NP  umeclidinium-vilanterol (ANORO ELLIPTA) 62.5-25 MCG/ACT AEPB Inhale 1 puff into the lungs daily. 09/02/22  Yes Burnadette Pop, MD  vitamin B-12 (CYANOCOBALAMIN) 1000 MCG tablet Take 1,000 mcg by mouth daily.   Yes [provider]  zolpidem (AMBIEN) 5 MG tablet Take 1 tablet (5 mg total) by mouth at bedtime as needed for sleep. 06/15/22 12/12/22 Yes Windell Norfolk, MD     Vital Signs: BP 115/70 (BP Location: Left Arm)   Pulse 94   Temp 99.7 F (37.6 C) (Oral)   Resp 20   Wt 199 lb (90.3 kg)   SpO2 93%   BMI 27.75 kg/m   Physical Exam Drain Location: RUQ Size: Fr size: 12 Fr Date of placement: 10/06/22  Currently to: Drain collection device: gravity 24 hour output:  Output by Drain (mL) 10/05/22 0701 - 10/05/22 1900 10/05/22 1901 - 10/06/22 0700 10/06/22 0701 - 10/06/22 1900 10/06/22 1901 - 10/07/22 0700 10/07/22 0701 - 10/07/22 1612  Biliary Tube Cook slip-coat 12 Fr. RUQ   300 60     Interval imaging/drain manipulation:  Exchange and upsize 10/06/22  Current examination: Flushes/aspirates easily.  Insertion site unremarkable. Suture and stat lock in place. Dressed appropriately.  Approximately 100 mL of bilious output in drain at time of exam.     Imaging: IR EXCHANGE BILIARY DRAIN  Result Date: 10/06/2022 INDICATION: Rigors, right abdominal pain, and decreased output from cholecystostomy tube EXAM: Exchange of cholecystostomy drainage catheter using fluoroscopic guidance MEDICATIONS: Documented in the EMR ANESTHESIA/SEDATION: Moderate (conscious) sedation was employed during this procedure. A total of Versed 1 mg and Fentanyl 100 mcg was administered  intravenously. Moderate Sedation Time: 10 minutes. The patient's level of consciousness and vital signs were monitored continuously by radiology nursing throughout the procedure under my direct supervision. FLUOROSCOPY TIME:  Fluoroscopy Time: 0.5 minutes (3 mGy) COMPLICATIONS: None immediate. PROCEDURE: Informed written consent was obtained from the patient after a thorough discussion of the procedural risks, benefits and alternatives. All questions were addressed. Maximal Sterile Barrier Technique was utilized including caps, mask, sterile gowns, sterile gloves, sterile drape, hand hygiene and skin antiseptic. A timeout was performed prior to the initiation of the procedure. Patient was placed supine on the exam table. The right upper quadrant was prepped and draped in the standard sterile fashion with inclusion of the existing cholecystostomy drainage catheter within the sterile field. Injection of the existing cholecystostomy drainage catheter demonstrated appropriate location within the gallbladder lumen. Locking loop was released, and over an Amplatz wire, the existing cholecystostomy drainage catheter was exchanged for a  new 12 French locking drainage catheter. Locking loop was formed, and location was confirmed with injection of contrast material opacifying the gallbladder lumen. Given concern for infection at this time, a dedicated cholangiogram and attempt to opacify the cystic duct was not attempted. The drainage catheter was attached to bag drainage. It was secured to the skin using silk suture and a dressing. The patient tolerated the procedure well without immediate complication. IMPRESSION: 1. Successful exchange and upsize of cholecystostomy drainage catheter for a new 12 French cholecystostomy tube. Cholecystostomy tube attached to bag drainage. 2. Recommend continued flushing 2-3 times daily. 3. Additional follow-up per surgery for cholecystectomy candidacy. Electronically Signed   By: Olive Bass M.D.   On: 10/06/2022 12:31   CT ABDOMEN PELVIS W CONTRAST  Result Date: 10/05/2022 CLINICAL DATA:  Abdominal pain. EXAM: CT ABDOMEN AND PELVIS WITH CONTRAST TECHNIQUE: Multidetector CT imaging of the abdomen and pelvis was performed using the standard protocol following bolus administration of intravenous contrast. RADIATION DOSE REDUCTION: This exam was performed according to the departmental dose-optimization program which includes automated exposure control, adjustment of the mA and/or kV according to patient size and/or use of iterative reconstruction technique. CONTRAST:  OMNIPAQUE IOHEXOL 300 MG/ML  SOLN COMPARISON:  CT abdomen pelvis dated September 16, 2022. FINDINGS: Lower chest: No acute abnormality.  Bibasilar atelectasis. Hepatobiliary: Unchanged small hemangioma in the right liver. No new focal liver abnormality. Percutaneous cholecystostomy tube remains in place. Slightly increased distention of the gallbladder compared to prior study. Unchanged mild pericholecystic inflammatory change. No biliary dilatation. Pancreas: Unremarkable. No pancreatic ductal dilatation or surrounding inflammatory changes. Spleen: Normal in size without focal abnormality. Adrenals/Urinary Tract: Adrenal glands are unremarkable. Kidneys are normal, without renal calculi, focal lesion, or hydronephrosis. Bladder is unremarkable. Stomach/Bowel: Stomach is within normal limits. Prior appendectomy. No evidence of bowel wall thickening, distention, or inflammatory changes. Left-sided colonic diverticulosis. Vascular/Lymphatic: Aortic atherosclerosis. No enlarged abdominal or pelvic lymph nodes. Reproductive: Prostate is unremarkable. Other: No abdominal wall hernia or abnormality. No abdominopelvic ascites. No pneumoperitoneum. Musculoskeletal: No acute or significant osseous findings. IMPRESSION: 1. Percutaneous cholecystostomy tube remains in place. Slightly increased distention of the gallbladder compared to prior  study with similar degree of mild pericholecystic inflammatory change. Correlate with tube function. 2.  Aortic Atherosclerosis (ICD10-I70.0). Electronically Signed   By: Obie Dredge M.D.   On: 10/05/2022 17:55   DG Chest Portable 1 View  Result Date: 10/05/2022 CLINICAL DATA:  Provided history: Infection. EXAM: PORTABLE CHEST 1 VIEW COMPARISON:  Chest CT 09/16/2022. Prior chest radiographs 09/16/2022 and earlier. FINDINGS: The cardiomediastinal silhouette is unchanged. Aortic atherosclerosis. Linear opacities within the right lung base compatible with atelectasis and/or scarring. No appreciable airspace consolidation. No evidence of pleural effusion or pneumothorax. No acute osseous abnormality identified. IMPRESSION: 1. No evidence of airspace consolidation. 2. Atelectasis and/or scarring within the right lung base. 3. Aortic Atherosclerosis (ICD10-I70.0). Electronically Signed   By: Jackey Loge D.O.   On: 10/05/2022 16:15    Labs:  CBC: Recent Labs    09/18/22 0542 10/05/22 1538 10/06/22 0524 10/07/22 0443  WBC 7.2 13.2* 9.9 8.1  HGB 12.8* 15.1 12.1* 11.2*  HCT 40.7 45.5 37.0* 35.8*  PLT 150 252 171 159    COAGS: Recent Labs    08/30/22 1205 08/31/22 0202 08/31/22 1047 09/01/22 0640 10/05/22 1551 10/06/22 1001  INR 1.1  --   --   --  1.2 1.4*  APTT  --  59* 88* 137* 36  --  BMP: Recent Labs    09/18/22 0542 10/05/22 1538 10/06/22 0524 10/07/22 0443  NA 133* 133* 134* 133*  K 4.1 4.1 4.3 3.7  CL 106 101 103 103  CO2 21* 22 21* 25  GLUCOSE 119* 108* 104* 102*  BUN 10 12 11 10   CALCIUM 8.2* 8.7* 8.0* 8.0*  CREATININE 0.73 0.91 0.91 0.91  GFRNONAA >60 >60 >60 >60    LIVER FUNCTION TESTS: Recent Labs    09/17/22 0122 10/05/22 1538 10/06/22 0524 10/07/22 0443  BILITOT 0.7 0.5 0.5 0.7  AST 18 21 21  14*  ALT 22 23 23 16   ALKPHOS 48 78 62 52  PROT 6.4* 7.4 5.6* 5.5*  ALBUMIN 2.9* 3.5 2.5* 2.3*    Assessment and Plan:  Perc chole upsize and  exchange 10/06/22 -patient tolerating drain well, no concerns of infection or drainage from site -Hgb 11.2 today (12.1 yesterday); WBC 8.1 (9.9 yesterday)  Plan: Continue TID flushes with 5 cc NS. Record output Q shift. Dressing changes QD or PRN if soiled.  Call IR APP or on call IR MD if difficulty flushing or sudden change in drain output.   Discharge planning: Please contact IR APP or on call IR MD prior to patient d/c to ensure appropriate follow up plans are in place. Typically patient will follow up with IR 6-8 weeks post d/c for drain exchange. IR scheduler will contact patient with date/time of appointment. Patient will need to flush drain QD with 5 cc NS, record output QD, dressing changes every 2-3 days or earlier if soiled.   IR will continue to follow - please call with questions or concerns.   Electronically Signed: Kennieth Francois, [name] 10/07/2022, 4:08 PM   I spent a total of 15 Minutes at the the patient's bedside AND on the patient's hospital floor or unit, greater than 50% of which was counseling/coordinating care for percutaneous cholecystostomy care.

## 2022-10-08 ENCOUNTER — Ambulatory Visit: Payer: Medicare Other | Admitting: Medical

## 2022-10-08 ENCOUNTER — Inpatient Hospital Stay (HOSPITAL_COMMUNITY): Payer: Medicare Other

## 2022-10-08 DIAGNOSIS — I2510 Atherosclerotic heart disease of native coronary artery without angina pectoris: Secondary | ICD-10-CM | POA: Diagnosis not present

## 2022-10-08 DIAGNOSIS — Z86711 Personal history of pulmonary embolism: Secondary | ICD-10-CM | POA: Diagnosis not present

## 2022-10-08 DIAGNOSIS — M7989 Other specified soft tissue disorders: Secondary | ICD-10-CM

## 2022-10-08 LAB — CULTURE, BLOOD (ROUTINE X 2): Special Requests: ADEQUATE

## 2022-10-08 LAB — CBC WITH DIFFERENTIAL/PLATELET
Abs Immature Granulocytes: 0.03 10*3/uL (ref 0.00–0.07)
Basophils Absolute: 0 10*3/uL (ref 0.0–0.1)
Basophils Relative: 0 %
Eosinophils Absolute: 0 10*3/uL (ref 0.0–0.5)
Eosinophils Relative: 0 %
HCT: 34.2 % — ABNORMAL LOW (ref 39.0–52.0)
Hemoglobin: 11 g/dL — ABNORMAL LOW (ref 13.0–17.0)
Immature Granulocytes: 0 %
Lymphocytes Relative: 23 %
Lymphs Abs: 1.6 10*3/uL (ref 0.7–4.0)
MCH: 30.4 pg (ref 26.0–34.0)
MCHC: 32.2 g/dL (ref 30.0–36.0)
MCV: 94.5 fL (ref 80.0–100.0)
Monocytes Absolute: 0.9 10*3/uL (ref 0.1–1.0)
Monocytes Relative: 14 %
Neutro Abs: 4.2 10*3/uL (ref 1.7–7.7)
Neutrophils Relative %: 63 %
Platelets: 145 10*3/uL — ABNORMAL LOW (ref 150–400)
RBC: 3.62 MIL/uL — ABNORMAL LOW (ref 4.22–5.81)
RDW: 14.4 % (ref 11.5–15.5)
WBC: 6.7 10*3/uL (ref 4.0–10.5)
nRBC: 0 % (ref 0.0–0.2)

## 2022-10-08 LAB — COMPREHENSIVE METABOLIC PANEL
ALT: 17 U/L (ref 0–44)
AST: 15 U/L (ref 15–41)
Albumin: 2.6 g/dL — ABNORMAL LOW (ref 3.5–5.0)
Alkaline Phosphatase: 51 U/L (ref 38–126)
Anion gap: 7 (ref 5–15)
BUN: 7 mg/dL — ABNORMAL LOW (ref 8–23)
CO2: 25 mmol/L (ref 22–32)
Calcium: 8.4 mg/dL — ABNORMAL LOW (ref 8.9–10.3)
Chloride: 103 mmol/L (ref 98–111)
Creatinine, Ser: 0.8 mg/dL (ref 0.61–1.24)
GFR, Estimated: >60 mL/min (ref >60)
Glucose, Bld: 109 mg/dL — ABNORMAL HIGH (ref 70–99)
Potassium: 3.5 mmol/L (ref 3.5–5.1)
Sodium: 135 mmol/L (ref 135–145)
Total Bilirubin: 0.3 mg/dL (ref 0.3–1.2)
Total Protein: 5.8 g/dL — ABNORMAL LOW (ref 6.5–8.1)

## 2022-10-08 LAB — MAGNESIUM: Magnesium: 2 mg/dL (ref 1.7–2.4)

## 2022-10-08 LAB — C-REACTIVE PROTEIN: CRP: 13.6 mg/dL — ABNORMAL HIGH (ref <1.0)

## 2022-10-08 LAB — PROCALCITONIN: Procalcitonin: <0.1 ng/mL

## 2022-10-08 LAB — BRAIN NATRIURETIC PEPTIDE: B Natriuretic Peptide: 318.2 pg/mL — ABNORMAL HIGH (ref 0.0–100.0)

## 2022-10-08 MED ORDER — LEVOFLOXACIN 750 MG PO TABS
750.0000 mg | ORAL_TABLET | Freq: Every day | ORAL | Status: DC
  Administered 2022-10-09: 750 mg via ORAL
  Filled 2022-10-08: qty 1

## 2022-10-08 MED ORDER — POTASSIUM CHLORIDE CRYS ER 20 MEQ PO TBCR
40.0000 meq | EXTENDED_RELEASE_TABLET | Freq: Once | ORAL | Status: AC
  Administered 2022-10-08: 40 meq via ORAL

## 2022-10-08 NOTE — Discharge Instructions (Signed)

## 2022-10-08 NOTE — Progress Notes (Signed)
Change ceftriaxone to Levaquin 750mg  PO qday x 3 to complete 7d course for bacteremia per Dr Thedore Mins.  Ulyses Southward, PharmD, BCIDP, AAHIVP, CPP Infectious Disease Pharmacist 10/08/2022 9:31 AM

## 2022-10-08 NOTE — Progress Notes (Signed)
Bilateral lower extremity venous duplex has been completed. Preliminary results can be found in CV Proc through chart review.   10/08/22 1:06 PM Olen Cordial RVT

## 2022-10-08 NOTE — Progress Notes (Signed)
PROGRESS NOTE                                                                                                                                                                                                             Patient Demographics:    Robert Lang, is a 72 y.o. male, DOB - October 28, 1950, EAV:[id number]  Outpatient Primary MD for the patient is [name], [name]    LOS - 3  Admit date - 10/05/2022    Chief Complaint  Patient presents with   Abdominal Pain       Brief Narrative (HPI from H&P)   72 y.o. male with medical history significant for IPF, COPD, OSA, CAD, hypertension, depression, anxiety, PAF on Eliquis, history of PE, and cholecystitis status post cholecystostomy tube placement 1st week of June, it stopped draining around the June 10th was re hospitalized and tube was revised, the last few days drain stopped draining again with + pain & fevers - came to the ER with CT showing GB distention admitted for drain malfunction.   Subjective:    Patient in bed, appears comfortable, denies any headache, no fever, no chest pain or pressure, no shortness of breath , no abdominal pain. No new focal weakness.   Assessment  & Plan :    1. Sepsis; right abdominal pain, decreased cholecystostomy tube output - appears to have Klebsiella bacteremia, currently on combination of IV Rocephin and Flagyl ( stop flagyl 10/07/22) , as above he has had cholecystitis for close to 5 weeks, he underwent cholecystostomy drain placement first week of June since then this is the second malfunction.  Drain repositioned by IR on 10/06/2022, much improved, also seen by general surgery who will do outpatient follow-up for cholecystectomy.  Prior to surgery must follow-up with his pulmonologist and cardiologist for evaluation.  Clinically much improved, follow final culture sensitivity data.   2. COPD; IPF; OSA  - Not in exacerbation on  admission  - Continue ICS-LAMA-LABA, pefinidone, and CPAP at bedtime , on 2 lits o2 for now in daytime.   3. PAF  - Continue Eliquis    4. CAD  - No anginal symptoms  - Continue Lipitor, Ranexa     5. Depression, anxiety  - Continue home regimen    6.  Recent HX of PE.  Eliquis  Condition - Extremely Guarded  Family Communication  : Wife [name] [phone]  on 10/06/2022 at 10:43 AM  Code Status : Full code  Consults  : IR  PUD Prophylaxis : PPI   Procedures  :     IR consulted for cholecystostomy drain revision on 10/06/2022.    CT - 1. Percutaneous cholecystostomy tube remains in place. Slightly increased distention of the gallbladder compared to prior study with similar degree of mild pericholecystic inflammatory change. Correlate with tube function. 2.  Aortic Atherosclerosis      Disposition Plan  :    Status is: Inpatient  DVT Prophylaxis  :    Place TED hose Start: 10/07/22 1428 Lab Results  Component Value Date   PLT 145 (L) 10/08/2022    Diet :  Diet Order             DIET SOFT Room service appropriate? Yes; Fluid consistency: Thin  Diet effective now                    Inpatient Medications  Scheduled Meds:  apixaban  5 mg Oral BID   atorvastatin  80 mg Oral Daily   buPROPion  300 mg Oral Daily   busPIRone  15 mg Oral TID   finasteride  5 mg Oral Daily   lamoTRIgine  100 mg Oral Daily   pantoprazole  40 mg Oral Daily   Pirfenidone  801 mg Oral TID   pregabalin  150 mg Oral BID   ranolazine  1,000 mg Oral BID   sertraline  150 mg Oral QHS   sodium chloride flush  10 mL Intracatheter Q12H   sodium chloride flush  3 mL Intravenous Q12H   umeclidinium-vilanterol  1 puff Inhalation Daily   Continuous Infusions:  cefTRIAXone (ROCEPHIN)  IV 2 g (10/08/22 0859)   PRN Meds:.acetaminophen **OR** acetaminophen, LORazepam, ondansetron **OR** ondansetron (ZOFRAN) IV, oxyCODONE, senna-docusate, traZODone    Objective:   Vitals:    10/07/22 2208 10/08/22 0050 10/08/22 0520 10/08/22 0840  BP: 138/84 126/72 122/75 115/88  Pulse: 93 88 83 84  Resp: 20 20 18 18   Temp: 98.3 F (36.8 C) 98 F (36.7 C) 98.2 F (36.8 C) 97.9 F (36.6 C)  TempSrc: Oral Oral Oral Oral  SpO2: 94% 95% 96% 96%  Weight:        Wt Readings from Last 3 Encounters:  10/05/22 90.3 kg  09/18/22 90.3 kg  09/16/22 89.7 kg     Intake/Output Summary (Last 24 hours) at 10/08/2022 0930 Last data filed at 10/08/2022 0900 Gross per 24 hour  Intake 806.67 ml  Output 1525 ml  Net -718.33 ml     Physical Exam  Awake Alert, No new F.N deficits, Normal affect [place].AT,PERRAL Supple Neck, No JVD,   Symmetrical Chest wall movement, Good air movement bilaterally, CTAB RRR,No Gallops,Rubs or new Murmurs,  +ve B.Sounds, Abd Soft, improved right upper quadrant tenderness, right upper quadrant cholecystostomy drain in place No Cyanosis, Clubbing or edema       Data Review:    Recent Labs  Lab 10/05/22 1538 10/06/22 0524 10/07/22 0443 10/08/22 0042  WBC 13.2* 9.9 8.1 6.7  HGB 15.1 12.1* 11.2* 11.0*  HCT 45.5 37.0* 35.8* 34.2*  PLT 252 171 159 145*  MCV 92.3 93.4 96.5 94.5  MCH 30.6 30.6 30.2 30.4  MCHC 33.2 32.7 31.3 32.2  RDW 14.6 14.7 14.9 14.4  LYMPHSABS 1.3  --  1.9 1.6  MONOABS 1.6*  --  1.2* 0.9  EOSABS 0.0  --  0.0 0.0  BASOSABS 0.0  --  0.0 0.0    Recent Labs  Lab 10/05/22 1538 10/05/22 1551 10/05/22 1811 10/05/22 1958 10/06/22 0520 10/06/22 0524 10/06/22 1001 10/07/22 0443 10/08/22 0042  NA 133*  --   --   --   --  134*  --  133*  --   K 4.1  --   --   --   --  4.3  --  3.7  --   CL 101  --   --   --   --  103  --  103  --   CO2 22  --   --   --   --  21*  --  25  --   ANIONGAP 10  --   --   --   --  10  --  5  --   GLUCOSE 108*  --   --   --   --  104*  --  102*  --   BUN 12  --   --   --   --  11  --  10  --   CREATININE 0.91  --   --   --   --  0.91  --  0.91  --   AST 21  --   --   --   --  21  --  14*  --    ALT 23  --   --   --   --  23  --  16  --   ALKPHOS 78  --   --   --   --  62  --  52  --   BILITOT 0.5  --   --   --   --  0.5  --  0.7  --   ALBUMIN 3.5  --   --   --   --  2.5*  --  2.3*  --   CRP  --   --   --   --  8.4*  --   --  17.2*  --   PROCALCITON  --   --   --   --   --  0.20  --  0.10 <0.10  LATICACIDVEN 2.5*  --  3.3* 1.6  --   --   --   --   --   INR  --  1.2  --   --   --   --  1.4*  --   --   BNP  --   --   --   --   --   --   --  309.0*  --   MG  --   --   --   --   --  1.6*  --  1.7  --   CALCIUM 8.7*  --   --   --   --  8.0*  --  8.0*  --       Recent Labs  Lab 10/05/22 1538 10/05/22 1551 10/05/22 1811 10/05/22 1958 10/06/22 0520 10/06/22 0524 10/06/22 1001 10/07/22 0443 10/08/22 0042  CRP  --   --   --   --  8.4*  --   --  17.2*  --   PROCALCITON  --   --   --   --   --  0.20  --  0.10 <0.10  LATICACIDVEN 2.5*  --  3.3* 1.6  --   --   --   --   --  INR  --  1.2  --   --   --   --  1.4*  --   --   BNP  --   --   --   --   --   --   --  309.0*  --   MG  --   --   --   --   --  1.6*  --  1.7  --   CALCIUM 8.7*  --   --   --   --  8.0*  --  8.0*  --      Radiology Reports IR EXCHANGE BILIARY DRAIN  Result Date: 10/06/2022 INDICATION: Rigors, right abdominal pain, and decreased output from cholecystostomy tube EXAM: Exchange of cholecystostomy drainage catheter using fluoroscopic guidance MEDICATIONS: Documented in the EMR ANESTHESIA/SEDATION: Moderate (conscious) sedation was employed during this procedure. A total of Versed 1 mg and Fentanyl 100 mcg was administered intravenously. Moderate Sedation Time: 10 minutes. The patient's level of consciousness and vital signs were monitored continuously by radiology nursing throughout the procedure under my direct supervision. FLUOROSCOPY TIME:  Fluoroscopy Time: 0.5 minutes (3 mGy) COMPLICATIONS: None immediate. PROCEDURE: Informed written consent was obtained from the patient after a thorough discussion of the  procedural risks, benefits and alternatives. All questions were addressed. Maximal Sterile Barrier Technique was utilized including caps, mask, sterile gowns, sterile gloves, sterile drape, hand hygiene and skin antiseptic. A timeout was performed prior to the initiation of the procedure. Patient was placed supine on the exam table. The right upper quadrant was prepped and draped in the standard sterile fashion with inclusion of the existing cholecystostomy drainage catheter within the sterile field. Injection of the existing cholecystostomy drainage catheter demonstrated appropriate location within the gallbladder lumen. Locking loop was released, and over an Amplatz wire, the existing cholecystostomy drainage catheter was exchanged for a new 12 French locking drainage catheter. Locking loop was formed, and location was confirmed with injection of contrast material opacifying the gallbladder lumen. Given concern for infection at this time, a dedicated cholangiogram and attempt to opacify the cystic duct was not attempted. The drainage catheter was attached to bag drainage. It was secured to the skin using silk suture and a dressing. The patient tolerated the procedure well without immediate complication. IMPRESSION: 1. Successful exchange and upsize of cholecystostomy drainage catheter for a new 12 French cholecystostomy tube. Cholecystostomy tube attached to bag drainage. 2. Recommend continued flushing 2-3 times daily. 3. Additional follow-up per surgery for cholecystectomy candidacy. Electronically Signed   By: Olive Bass M.D.   On: 10/06/2022 12:31   CT ABDOMEN PELVIS W CONTRAST  Result Date: 10/05/2022 CLINICAL DATA:  Abdominal pain. EXAM: CT ABDOMEN AND PELVIS WITH CONTRAST TECHNIQUE: Multidetector CT imaging of the abdomen and pelvis was performed using the standard protocol following bolus administration of intravenous contrast. RADIATION DOSE REDUCTION: This exam was performed according to the  departmental dose-optimization program which includes automated exposure control, adjustment of the mA and/or kV according to patient size and/or use of iterative reconstruction technique. CONTRAST:  OMNIPAQUE IOHEXOL 300 MG/ML  SOLN COMPARISON:  CT abdomen pelvis dated September 16, 2022. FINDINGS: Lower chest: No acute abnormality.  Bibasilar atelectasis. Hepatobiliary: Unchanged small hemangioma in the right liver. No new focal liver abnormality. Percutaneous cholecystostomy tube remains in place. Slightly increased distention of the gallbladder compared to prior study. Unchanged mild pericholecystic inflammatory change. No biliary dilatation. Pancreas: Unremarkable. No pancreatic ductal dilatation or surrounding inflammatory changes. Spleen: Normal in size  without focal abnormality. Adrenals/Urinary Tract: Adrenal glands are unremarkable. Kidneys are normal, without renal calculi, focal lesion, or hydronephrosis. Bladder is unremarkable. Stomach/Bowel: Stomach is within normal limits. Prior appendectomy. No evidence of bowel wall thickening, distention, or inflammatory changes. Left-sided colonic diverticulosis. Vascular/Lymphatic: Aortic atherosclerosis. No enlarged abdominal or pelvic lymph nodes. Reproductive: Prostate is unremarkable. Other: No abdominal wall hernia or abnormality. No abdominopelvic ascites. No pneumoperitoneum. Musculoskeletal: No acute or significant osseous findings. IMPRESSION: 1. Percutaneous cholecystostomy tube remains in place. Slightly increased distention of the gallbladder compared to prior study with similar degree of mild pericholecystic inflammatory change. Correlate with tube function. 2.  Aortic Atherosclerosis (ICD10-I70.0). Electronically Signed   By: Obie Dredge M.D.   On: 10/05/2022 17:55   DG Chest Portable 1 View  Result Date: 10/05/2022 CLINICAL DATA:  Provided history: Infection. EXAM: PORTABLE CHEST 1 VIEW COMPARISON:  Chest CT 09/16/2022. Prior chest  radiographs 09/16/2022 and earlier. FINDINGS: The cardiomediastinal silhouette is unchanged. Aortic atherosclerosis. Linear opacities within the right lung base compatible with atelectasis and/or scarring. No appreciable airspace consolidation. No evidence of pleural effusion or pneumothorax. No acute osseous abnormality identified. IMPRESSION: 1. No evidence of airspace consolidation. 2. Atelectasis and/or scarring within the right lung base. 3. Aortic Atherosclerosis (ICD10-I70.0). Electronically Signed   By: Jackey Loge D.O.   On: 10/05/2022 16:15      Signature  -   Susa Raring M.D on 10/08/2022 at 9:30 AM   -  To page go to [email or web address]

## 2022-10-08 NOTE — Progress Notes (Signed)
Occupational Therapy Treatment Patient Details Name: Robert Lang MRN: [id number] DOB: May 27, 1950 Today's Date: 10/08/2022   History of present illness 72 y.o. male who presents 10/05/22 with decreased output from his cholecystostomy tube, rigors, and right-sided abdominal pain.PMH significant of hypertension, hyperlipidemia, atrial fibrillation on anticoagulation, CAD, COPD, idiopathic pulmonary fibrosis, periodic limb movement disorder, anxiety, OSA., and cholecystitis status post cholecystostomy tube placement on 08/30/2022   OT comments  Pt. Seen for skilled OT treatment session.  Wife present and an active participant as well. Pt. Declined in room mobility/tasks reporting he had been up "all night going to/from b.room and was exhausted".  Focused session on energy conservation strategies and how to implement during adl completion.  Handouts provided and reviewed with pt and spouse.  Both able to also provide examples they already have in place at home.  They also described dme, and other home modifications that provide ease during adl completion.  Agree with d/c recommendations.  Will cont. With current acute ot poc.     Recommendations for follow up therapy are one component of a multi-disciplinary discharge planning process, led by the attending physician.  Recommendations may be updated based on patient status, additional functional criteria and insurance authorization.    Assistance Recommended at Discharge Frequent or constant Supervision/Assistance  Patient can return home with the following  A little help with walking and/or transfers;A little help with bathing/dressing/bathroom;Assistance with cooking/housework;Direct supervision/assist for medications management;Direct supervision/assist for financial management;Assist for transportation;Help with stairs or ramp for entrance;Assistance with feeding   Equipment Recommendations  None recommended by OT    Recommendations for Other  Services      Precautions / Restrictions Precautions Precautions: Fall Restrictions Weight Bearing Restrictions: No       Mobility Bed Mobility                    Transfers                         Balance                                           ADL either performed or assessed with clinical judgement   ADL Overall ADL's : Needs assistance/impaired     Grooming: Cueing for compensatory techniques Grooming Details (indicate cue type and reason): reviewed energy conservation strategies with pt. and spouse for sitting when completing grooming tasks             Lower Body Dressing: Supervision/safety;Sitting/lateral leans Lower Body Dressing Details (indicate cue type and reason): pt. able to bring bles up towards chest for lb dressing   Toilet Transfer Details (indicate cue type and reason): pt. declined need to demo/review. reports he has been going to the b.room "all night" and is very tired     Tub/ Engineer, structural: Walk-in shower;Shower seat;Rolling walker (2 wheels) Tub/Shower Transfer Details (indicate cue type and reason): reviewed with pt. and spouse and also provided demo for trouble shooting safe transfer. has small ledge with shower chair. reviewed post. entry to utilize rw. pt. reports he currently holds onto shower stall door frame.  reviewed potential safety risk if he is relying too much on doorway for support.  they report having discussed suction based grab bars with HH therapies but not sure the design of shower stall allows for them.  they both verbalize  that pt. does not do any shower transfers without wife present. encouraged them to try transfer again with [facility] therapies prior to actual attempt just to ensure safety.   General ADL Comments: provided energy conservation handouts and pt.and spouse able to verbalize systems already in place that pt. uses to conserve energy.  they report higher toilets, hand held shower head,  shower chair. several modifications already in place.    Extremity/Trunk Assessment              Vision       Perception     Praxis      Cognition Arousal/Alertness: Awake/alert Behavior During Therapy: WFL for tasks assessed/performed, Agitated Overall Cognitive Status: Within Functional Limits for tasks assessed                                 General Comments: pt. and spouse expressing frustrations with some of their experiences with communication and understanding of procedures being performed and pts. diagnosis information ect. provided emotional support and contacted rn via secure chat to let her know they may have some questions        Exercises      Shoulder Instructions       General Comments      Pertinent Vitals/ Pain       Pain Assessment Pain Assessment: No/denies pain  Home Living                                          Prior Functioning/Environment              Frequency  Min 2X/week        Progress Toward Goals  OT Goals(current goals can now be found in the care plan section)  Progress towards OT goals: Progressing toward goals     Plan      Co-evaluation                 AM-PAC OT "6 Clicks" Daily Activity     Outcome Measure   Help from another person eating meals?: A Little Help from another person taking care of personal grooming?: A Little Help from another person toileting, which includes using toliet, bedpan, or urinal?: A Lot Help from another person bathing (including washing, rinsing, drying)?: A Little Help from another person to put on and taking off regular upper body clothing?: A Little Help from another person to put on and taking off regular lower body clothing?: A Little 6 Click Score: 17    End of Session Equipment Utilized During Treatment: Oxygen  OT Visit Diagnosis: Unsteadiness on feet (R26.81);Muscle weakness (generalized) (M62.81)   Activity Tolerance  Patient tolerated treatment well   Patient Left in chair;with call bell/phone within reach;with family/visitor present;with nursing/sitter in room   Nurse Communication Other (comment) (reviewed with rn via secure chat pt./spouse may have questions regarding procedures, hospital course, and any diagnosis updates/plans.)        Time: 1610-9604 OT Time Calculation (min): 20 min  Charges: OT General Charges $OT Visit: 1 Visit OT Treatments $Self Care/Home Management : 8-22 mins  Boneta Lucks, COTA/L Acute Rehabilitation [phone]   Alessandra Bevels Lorraine-COTA/L 10/08/2022, 11:42 AM

## 2022-10-08 NOTE — Progress Notes (Signed)
SATURATION QUALIFICATIONS: (This note is used to comply with regulatory documentation for home oxygen)  Patient Saturations on Room Air at Rest = 88%  Patient Saturations on Room Air while Ambulating = 86%  Patient Saturations on 2 Liters of oxygen while Ambulating = 92%  Please briefly explain why patient needs home oxygen: Needs 2L O2 while ambulating to mainting O2 saturation.

## 2022-10-08 NOTE — Plan of Care (Signed)

## 2022-10-09 ENCOUNTER — Other Ambulatory Visit (HOSPITAL_COMMUNITY): Payer: Self-pay

## 2022-10-09 ENCOUNTER — Other Ambulatory Visit: Payer: Self-pay | Admitting: Behavioral Health

## 2022-10-09 DIAGNOSIS — G4700 Insomnia, unspecified: Secondary | ICD-10-CM

## 2022-10-09 DIAGNOSIS — A419 Sepsis, unspecified organism: Secondary | ICD-10-CM | POA: Diagnosis not present

## 2022-10-09 LAB — COMPREHENSIVE METABOLIC PANEL
ALT: 16 U/L (ref 0–44)
AST: 15 U/L (ref 15–41)
Albumin: 2.5 g/dL — ABNORMAL LOW (ref 3.5–5.0)
Alkaline Phosphatase: 49 U/L (ref 38–126)
Anion gap: 10 (ref 5–15)
BUN: <5 mg/dL — ABNORMAL LOW (ref 8–23)
CO2: 27 mmol/L (ref 22–32)
Calcium: 8.5 mg/dL — ABNORMAL LOW (ref 8.9–10.3)
Chloride: 101 mmol/L (ref 98–111)
Creatinine, Ser: 0.73 mg/dL (ref 0.61–1.24)
GFR, Estimated: >60 mL/min (ref >60)
Glucose, Bld: 106 mg/dL — ABNORMAL HIGH (ref 70–99)
Potassium: 4 mmol/L (ref 3.5–5.1)
Sodium: 138 mmol/L (ref 135–145)
Total Bilirubin: 0.4 mg/dL (ref 0.3–1.2)
Total Protein: 6.2 g/dL — ABNORMAL LOW (ref 6.5–8.1)

## 2022-10-09 LAB — CBC WITH DIFFERENTIAL/PLATELET
Abs Immature Granulocytes: 0.03 10*3/uL (ref 0.00–0.07)
Basophils Absolute: 0 10*3/uL (ref 0.0–0.1)
Basophils Relative: 0 %
Eosinophils Absolute: 0 10*3/uL (ref 0.0–0.5)
Eosinophils Relative: 0 %
HCT: 35.8 % — ABNORMAL LOW (ref 39.0–52.0)
Hemoglobin: 11.5 g/dL — ABNORMAL LOW (ref 13.0–17.0)
Immature Granulocytes: 0 %
Lymphocytes Relative: 15 %
Lymphs Abs: 1.1 10*3/uL (ref 0.7–4.0)
MCH: 30.1 pg (ref 26.0–34.0)
MCHC: 32.1 g/dL (ref 30.0–36.0)
MCV: 93.7 fL (ref 80.0–100.0)
Monocytes Absolute: 1 10*3/uL (ref 0.1–1.0)
Monocytes Relative: 13 %
Neutro Abs: 5.4 10*3/uL (ref 1.7–7.7)
Neutrophils Relative %: 72 %
Platelets: 173 10*3/uL (ref 150–400)
RBC: 3.82 MIL/uL — ABNORMAL LOW (ref 4.22–5.81)
RDW: 14.3 % (ref 11.5–15.5)
WBC: 7.5 10*3/uL (ref 4.0–10.5)
nRBC: 0 % (ref 0.0–0.2)

## 2022-10-09 LAB — CULTURE, BLOOD (ROUTINE X 2)
Culture: NO GROWTH
Special Requests: ADEQUATE

## 2022-10-09 LAB — C-REACTIVE PROTEIN: CRP: 7.7 mg/dL — ABNORMAL HIGH (ref <1.0)

## 2022-10-09 LAB — BRAIN NATRIURETIC PEPTIDE: B Natriuretic Peptide: 330.7 pg/mL — ABNORMAL HIGH (ref 0.0–100.0)

## 2022-10-09 LAB — MAGNESIUM: Magnesium: 2 mg/dL (ref 1.7–2.4)

## 2022-10-09 LAB — PROCALCITONIN: Procalcitonin: <0.1 ng/mL

## 2022-10-09 MED ORDER — ACETAMINOPHEN 500 MG PO TABS
500.0000 mg | ORAL_TABLET | Freq: Three times a day (TID) | ORAL | 0 refills | Status: AC | PRN
  Filled 2022-10-09: qty 100, 34d supply, fill #0

## 2022-10-09 MED ORDER — ONDANSETRON HCL 4 MG PO TABS
4.0000 mg | ORAL_TABLET | Freq: Three times a day (TID) | ORAL | 0 refills | Status: AC | PRN
  Filled 2022-10-09: qty 13, 4d supply, fill #0
  Filled 2022-10-09: qty 7, 3d supply, fill #0

## 2022-10-09 MED ORDER — LEVOFLOXACIN 750 MG PO TABS
750.0000 mg | ORAL_TABLET | Freq: Every day | ORAL | 0 refills | Status: DC
  Filled 2022-10-09: qty 3, 3d supply, fill #0

## 2022-10-09 MED ORDER — OXYCODONE HCL 5 MG PO TABS
5.0000 mg | ORAL_TABLET | Freq: Two times a day (BID) | ORAL | 0 refills | Status: DC | PRN
  Filled 2022-10-09: qty 6, 3d supply, fill #0

## 2022-10-09 NOTE — TOC Transition Note (Addendum)
Transition of Care [facility]) - CM/SW Discharge Note   Patient Details  Name: Robert Lang MRN: [id number] Date of Birth: [birth year]  Transition of Care [name]) CM/SW Contact:  [name], RN Phone Number: 10/09/2022, 9:29 AM   Clinical Narrative:     Spoke w patient to discuss DC needs.  Ordered home oxygen through [employer], it will be delivered to the room today for DC.  Notified [facility]  that he will DC today.   11:15 Per nurse, patient/ wife requesting RW, I asked [employer] to bring one to the room.    Final next level of care: Home w Home Health Services Barriers to Discharge: No Barriers Identified   Patient Goals and CMS Choice CMS Medicare.gov Compare Post Acute Care list provided to:: Patient (wants [facility]) Choice offered to / list presented to : Patient  Discharge Placement                         Discharge Plan and Services Additional resources added to the After Visit Summary for     Discharge Planning Services: CM Consult Post Acute Care Choice: Home Health              Date DME Agency Contacted: 10/09/22 Time DME Agency Contacted: [phone] Representative spoke with at DME Agency: Vaughan Basta HH Arranged: OT, PT HH Agency: [facility] ([facility]) Date HH Agency Contacted: 10/09/22 Time HH Agency Contacted: [phone] Representative spoke with at [facility] Agency: Evonnie Pat  Social Determinants of Health (SDOH) Interventions SDOH Screenings   Food Insecurity: No Food Insecurity (10/06/2022)  Housing: Low Risk  (10/06/2022)  Transportation Needs: No Transportation Needs (10/06/2022)  Utilities: Not At Risk (10/06/2022)  Alcohol Screen: Low Risk  (11/25/2020)  Depression (PHQ2-9): Low Risk  (07/09/2022)  Financial Resource Strain: Low Risk  (12/22/2021)  Physical Activity: Inactive (12/22/2021)  Social Connections: Moderately Integrated (12/22/2021)  Stress: No Stress Concern Present (12/22/2021)  Tobacco Use: Medium Risk (10/06/2022)     Readmission Risk  Interventions     No data to display

## 2022-10-09 NOTE — Progress Notes (Signed)
Discharge instructions reviewed with pt and his wife. Both verbalize understanding of instructions.  Copy of instructions given to pt. [facility] filling scripts and will deliver meds to pt's room when ready. Pt also waiting for O2 tank to be delivered to his room for home use. Pt provided with saline flushes to continue flushing his gallbladder drain as directed daily.  Pt/wife to call surgeon and PCP on Monday for follow up appointments.  Pt still trying to get pulmonary and cardiology clearance for gallbladder surgery, wife will continue to reach out to his cardiology and pulmonology office to get this scheduled, and also ask the surgeon office to help facilitate getting the clearance appointments as soon as possible.   Vaniah Chambers,RN SWOT

## 2022-10-09 NOTE — Plan of Care (Signed)

## 2022-10-09 NOTE — Discharge Summary (Signed)
Robert Lang OZD:[id number] DOB: 10/05/1950 DOA: 10/05/2022  PCP: Robert Richters, [name]  Admit date: 10/05/2022  Discharge date: 10/09/2022  Admitted From: Home   Disposition:  Home   Recommendations for Outpatient Follow-up:   Follow up with PCP in 1-2 weeks  PCP Please obtain BMP/CBC, 2 view CXR in 1week,  (see Discharge instructions)   PCP Please follow up on the following pending results: Monitor gallbladder drain closely, check CBC and CMP in 7 to 10 days, will require eventual cholecystectomy, needs prior evaluation by his pulmonologist and cardiologist.   Home Health: PT, RN   Equipment/Devices: as below  Consultations: IR, CCS Discharge Condition: Stable    CODE STATUS: Full    Diet Recommendation: Heart Healthy    Chief Complaint  Patient presents with   Abdominal Pain     Brief history of present illness from the day of admission and additional interim summary    72 y.o. male with medical history significant for IPF, COPD, OSA, CAD, hypertension, depression, anxiety, PAF on Eliquis, history of PE, and cholecystitis status post cholecystostomy tube placement 1st week of June, it stopped draining around the June 10th was re hospitalized and tube was revised, the last few days drain stopped draining again with + pain & fevers - came to the ER with CT showing GB distention admitted for drain malfunction.                                                                  Hospital Course   1. Sepsis; right abdominal pain, decreased cholecystostomy tube output - appears to have Klebsiella bacteremia, currently on combination of IV Rocephin and Flagyl & transitioned to oral Levaquin on 10/08/2022, as above he has had cholecystitis for close to 5 weeks, he underwent cholecystostomy drain placement first week  of June since then this is the second malfunction.  Drain repositioned by IR on 10/06/2022, much improved, also seen by general surgery who will do outpatient follow-up for cholecystectomy.    Clinically much improved, currently symptom-free, discussed with IR team and general surgery, cleared for discharge, will get 3 more days of oral antibiotics total of 8 days, clinically infection has resolved.  Will be discharged home with close outpatient follow-up with PCP, eventual cholecystectomy with general surgery after clearance with his primary cardiologist and pulmonologist.    2. COPD; IPF; OSA  - Not in exacerbation on admission  - Continue ICS-LAMA-LABA, pefinidone, and CPAP at bedtime , on 2 lits o2 for now in daytime.  Pulse ox at home at rest was running in the low 80s for several weeks according to the patient.  Requested to follow-up with his pulmonary physician and cardiologist within 7 to 10 days of discharge.   3. PAF  - Continue Eliquis    4.  CAD  - No anginal symptoms  - Continue Lipitor, Ranexa     5. Depression, anxiety  - Continue home regimen     6.  Recent HX of PE.  Eliquis, lower extremity venous duplex negative here.   Discharge diagnosis     Principal Problem:   Sepsis (HCC) Active Problems:   Coronary artery disease   IPF (idiopathic pulmonary fibrosis) (HCC)   COPD (chronic obstructive pulmonary disease) (HCC)   Depressive disorder   Obstructive sleep apnea   Hypertension   Paroxysmal atrial fibrillation (HCC)   History of pulmonary embolism   GAD (generalized anxiety disorder)    Discharge instructions    Discharge Instructions     Ambulatory Referral for Lung Cancer Scre   Complete by: As directed    Diet - low sodium heart healthy   Complete by: As directed    Discharge instructions   Complete by: As directed    Continue drain care as before.  Follow at the drain clinic as per previous arrangement, also follow-up with your pulmonary physician  and cardiologist within the next 7 to 10 days and get clearance for gallbladder removal surgery.  Follow with Primary MD [name], Robert Dredge, [name] in 7 days   Get CBC, CMP, 2 view Chest X ray -  checked next visit with your primary MD    Activity: As tolerated with Full fall precautions use walker/cane & assistance as needed  Disposition Home    Diet: Heart Healthy    Special Instructions: If you have smoked or chewed Tobacco  in the last 2 yrs please stop smoking, stop any regular Alcohol  and or any Recreational drug use.  On your next visit with your primary care physician please Get Medicines reviewed and adjusted.  Please request your Prim.MD to go over all Hospital Tests and Procedure/Radiological results at the follow up, please get all Hospital records sent to your Prim MD by signing hospital release before you go home.  If you experience worsening of your admission symptoms, develop shortness of breath, life threatening emergency, suicidal or homicidal thoughts you must seek medical attention immediately by calling 911 or calling your MD immediately  if symptoms less severe.  You Must read complete instructions/literature along with all the possible adverse reactions/side effects for all the Medicines you take and that have been prescribed to you. Take any new Medicines after you have completely understood and accpet all the possible adverse reactions/side effects.   Do not drive when taking Pain medications.  Do not take more than prescribed Pain, Sleep and Anxiety Medications   Discharge wound care:   Complete by: As directed    Continue drain care as before.   Increase activity slowly   Complete by: As directed        Discharge Medications   Allergies as of 10/09/2022       Reactions   Naproxen Other (See Comments)   (Naprosyn *ANALGESICS - ANTI-INFLAMMATORY*) Nausea, Abdominal pain   Silodosin Other (See Comments)   Low blood pressure        Medication List      TAKE these medications    acetaminophen 500 MG tablet Commonly known as: TYLENOL Take 1 tablet (500 mg total) by mouth every 8 (eight) hours as needed for moderate pain.   Anoro Ellipta 62.5-25 MCG/ACT Aepb Generic drug: umeclidinium-vilanterol Inhale 1 puff into the lungs daily.   apixaban 5 MG Tabs tablet Commonly known as: ELIQUIS Take 1 tablet (5 mg total)  by mouth 2 (two) times daily.   atorvastatin 80 MG tablet Commonly known as: LIPITOR Take 1 tablet (80 mg total) by mouth daily. What changed: when to take this   BD PosiFlush 0.9 % Soln injection Generic drug: sodium chloride flush Flush drain with 5 mLs daily. Discard remaining 5ml in each syringe.   buPROPion 300 MG 24 hr tablet Commonly known as: WELLBUTRIN XL Take 1 tablet (300 mg total) by mouth daily.   busPIRone 15 MG tablet Commonly known as: BUSPAR Take 1 tablet (15 mg total) by mouth 3 (three) times daily.   cyanocobalamin 1000 MCG tablet Commonly known as: VITAMIN B12 Take 1,000 mcg by mouth daily.   docusate sodium 100 MG capsule Commonly known as: COLACE Take 100 mg by mouth daily.   famotidine 40 MG tablet Commonly known as: PEPCID TAKE 1 TABLET(40 MG) BY MOUTH AT BEDTIME What changed: See the new instructions.   fexofenadine 180 MG tablet Commonly known as: ALLEGRA Take 180 mg by mouth at bedtime.   finasteride 5 MG tablet Commonly known as: PROSCAR Take 1 tablet (5 mg total) by mouth daily.   guaiFENesin 600 MG 12 hr tablet Commonly known as: MUCINEX Take 2 tablets (1,200 mg total) by mouth 2 (two) times daily.   lamoTRIgine 100 MG tablet Commonly known as: LaMICtal Take 1 tablet (100 mg total) by mouth daily. What changed: when to take this   levalbuterol 1.25 MG/0.5ML nebulizer solution Commonly known as: XOPENEX Take 1.25 mg by nebulization every 4 (four) hours as needed for wheezing or shortness of breath.   levofloxacin 750 MG tablet Commonly known as: LEVAQUIN Take 1  tablet (750 mg total) by mouth daily. Start taking on: October 11, 2022   LORazepam 0.5 MG tablet Commonly known as: Ativan Take one tablet by mouth only for severe anxiety or agitation What changed:  how much to take how to take this when to take this reasons to take this additional instructions   Melatonin 10 MG Tabs Take 1 tablet by mouth at bedtime.   MIRALAX PO Take 17 g by mouth daily.   nitroGLYCERIN 0.4 MG SL tablet Commonly known as: NITROSTAT Place 1 tablet (0.4 mg total) under the tongue every 5 (five) minutes as needed for chest pain.   ondansetron 4 MG tablet Commonly known as: Zofran Take 1 tablet (4 mg total) by mouth every 8 (eight) hours as needed for nausea or vomiting.   oxyCODONE 5 MG immediate release tablet Commonly known as: Oxy IR/ROXICODONE Take 1 tablet (5 mg total) by mouth every 12 (twelve) hours as needed for severe pain.   pantoprazole 40 MG tablet Commonly known as: PROTONIX TAKE 1 TABLET(40 MG) BY MOUTH DAILY What changed: See the new instructions.   Pirfenidone 801 MG Tabs Commonly known as: Esbriet Take 1 tablet (801 mg total) by mouth 3 (three) times daily.   pregabalin 150 MG capsule Commonly known as: LYRICA Take 1 capsule (150 mg total) by mouth 2 (two) times daily.   PRESERVISION AREDS PO Take 1 capsule by mouth in the morning and at bedtime.   ranolazine 1000 MG SR tablet Commonly known as: RANEXA Take 1 tablet (1,000 mg total) by mouth 2 (two) times daily.   sertraline 100 MG tablet Commonly known as: ZOLOFT Take 1.5 tablets (150 mg total) by mouth at bedtime.   traZODone 50 MG tablet Commonly known as: DESYREL Take 1 tablet (50 mg total) by mouth at bedtime as needed for sleep (May  take 1/2 tab to 1 tablet for sleep, as needed.).   Vitamin D3 25 MCG (1000 UT) Caps Take 2 capsules by mouth 2 (two) times daily.   zolpidem 5 MG tablet Commonly known as: AMBIEN Take 1 tablet (5 mg total) by mouth at bedtime as needed  for sleep.               Durable Medical Equipment  (From admission, onward)           Start     Ordered   10/08/22 1220  For home use only DME oxygen  Once       Question Answer Comment  Length of Need Lifetime   Mode or (Route) Nasal cannula   Frequency Continuous (stationary and portable oxygen unit needed)   Oxygen conserving device Yes   Oxygen delivery system Gas      10/08/22 1220              Discharge Care Instructions  (From admission, onward)           Start     Ordered   10/09/22 0000  Discharge wound care:       Comments: Continue drain care as before.   10/09/22 0908             Follow-up Information     [name], Robert Dredge, [name]. Schedule an appointment as soon as possible for a visit in 1 week(s).   Specialties: Internal Medicine, Family Medicine Why: Follow-up with your pulmonologist and cardiologist within a week of discharge.  Get clearance for gallbladder surgery Contact information: [address] [place] [address] [phone]         [facility], [place]. Schedule an appointment as soon as possible for a visit in 1 week(s).   Specialty: General Surgery Contact information: [address] [place] [address] [phone]                Major procedures and Radiology Reports - PLEASE review detailed and final reports thoroughly  -      VAS [place] LOWER EXTREMITY VENOUS (DVT)  Result Date: 10/08/2022  Lower Venous DVT Study Patient Name:  ZEBULUN DEMAN  Date of Exam:   10/08/2022 Medical Rec #: [id number]          Accession #:    [id number] Date of Birth: 09-29-1950           Patient Gender: M Patient Age:   27 years Exam Location:  [facility] Procedure:      VAS [place] LOWER EXTREMITY VENOUS (DVT) Referring Phys: Bess Harvest [facility] --------------------------------------------------------------------------------  Indications: Swelling.  Risk Factors: None  identified. Anticoagulation: Eliquis. Limitations: Poor ultrasound/tissue interface. Comparison Study: 09/17/2022 - Negative for DVT. Performing Technologist: Chanda Busing RVT  Examination Guidelines: A complete evaluation includes B-mode imaging, spectral Doppler, color Doppler, and power Doppler as needed of all accessible portions of each vessel. Bilateral testing is considered an integral part of a complete examination. Limited examinations for reoccurring indications may be performed as noted. The reflux portion of the exam is performed with the patient in reverse Trendelenburg.  +---------+---------------+---------+-----------+----------+--------------+ RIGHT    CompressibilityPhasicitySpontaneityPropertiesThrombus Aging +---------+---------------+---------+-----------+----------+--------------+ CFV      Full           Yes      Yes                                 +---------+---------------+---------+-----------+----------+--------------+  SFJ      Full                                                        +---------+---------------+---------+-----------+----------+--------------+ FV Prox  Full                                                        +---------+---------------+---------+-----------+----------+--------------+ FV Mid   Full                                                        +---------+---------------+---------+-----------+----------+--------------+ FV DistalFull                                                        +---------+---------------+---------+-----------+----------+--------------+ PFV      Full                                                        +---------+---------------+---------+-----------+----------+--------------+ POP      Full           Yes      Yes                                 +---------+---------------+---------+-----------+----------+--------------+ PTV      Full                                                         +---------+---------------+---------+-----------+----------+--------------+ PERO     Full                                                        +---------+---------------+---------+-----------+----------+--------------+   +---------+---------------+---------+-----------+----------+--------------+ LEFT     CompressibilityPhasicitySpontaneityPropertiesThrombus Aging +---------+---------------+---------+-----------+----------+--------------+ CFV      Full           Yes      Yes                                 +---------+---------------+---------+-----------+----------+--------------+ SFJ      Full                                                        +---------+---------------+---------+-----------+----------+--------------+  FV Prox  Full                                                        +---------+---------------+---------+-----------+----------+--------------+ FV Mid   Full                                                        +---------+---------------+---------+-----------+----------+--------------+ FV DistalFull                                                        +---------+---------------+---------+-----------+----------+--------------+ PFV      Full                                                        +---------+---------------+---------+-----------+----------+--------------+ POP      Full           Yes      Yes                                 +---------+---------------+---------+-----------+----------+--------------+ PTV      Full                                                        +---------+---------------+---------+-----------+----------+--------------+ PERO     Full                                                        +---------+---------------+---------+-----------+----------+--------------+     Summary: RIGHT: - There is no evidence of deep vein thrombosis in the lower extremity.  - No cystic  structure found in the popliteal fossa.  LEFT: - There is no evidence of deep vein thrombosis in the lower extremity.  - No cystic structure found in the popliteal fossa.  *See table(s) above for measurements and observations. Electronically signed by Sherald Hess MD on 10/08/2022 at 2:12:39 PM.    Final    IR EXCHANGE BILIARY DRAIN  Result Date: 10/06/2022 INDICATION: Rigors, right abdominal pain, and decreased output from cholecystostomy tube EXAM: Exchange of cholecystostomy drainage catheter using fluoroscopic guidance MEDICATIONS: Documented in the EMR ANESTHESIA/SEDATION: Moderate (conscious) sedation was employed during this procedure. A total of Versed 1 mg and Fentanyl 100 mcg was administered intravenously. Moderate Sedation Time: 10 minutes. The patient's level of consciousness and vital signs were monitored continuously by radiology nursing throughout the procedure under my direct supervision. FLUOROSCOPY TIME:  Fluoroscopy Time: 0.5 minutes (3 mGy) COMPLICATIONS: None immediate. PROCEDURE: Informed  written consent was obtained from the patient after a thorough discussion of the procedural risks, benefits and alternatives. All questions were addressed. Maximal Sterile Barrier Technique was utilized including caps, mask, sterile gowns, sterile gloves, sterile drape, hand hygiene and skin antiseptic. A timeout was performed prior to the initiation of the procedure. Patient was placed supine on the exam table. The right upper quadrant was prepped and draped in the standard sterile fashion with inclusion of the existing cholecystostomy drainage catheter within the sterile field. Injection of the existing cholecystostomy drainage catheter demonstrated appropriate location within the gallbladder lumen. Locking loop was released, and over an Amplatz wire, the existing cholecystostomy drainage catheter was exchanged for a new 12 French locking drainage catheter. Locking loop was formed, and location was  confirmed with injection of contrast material opacifying the gallbladder lumen. Given concern for infection at this time, a dedicated cholangiogram and attempt to opacify the cystic duct was not attempted. The drainage catheter was attached to bag drainage. It was secured to the skin using silk suture and a dressing. The patient tolerated the procedure well without immediate complication. IMPRESSION: 1. Successful exchange and upsize of cholecystostomy drainage catheter for a new 12 French cholecystostomy tube. Cholecystostomy tube attached to bag drainage. 2. Recommend continued flushing 2-3 times daily. 3. Additional follow-up per surgery for cholecystectomy candidacy. Electronically Signed   By: Olive Bass M.D.   On: 10/06/2022 12:31   CT ABDOMEN PELVIS W CONTRAST  Result Date: 10/05/2022 CLINICAL DATA:  Abdominal pain. EXAM: CT ABDOMEN AND PELVIS WITH CONTRAST TECHNIQUE: Multidetector CT imaging of the abdomen and pelvis was performed using the standard protocol following bolus administration of intravenous contrast. RADIATION DOSE REDUCTION: This exam was performed according to the departmental dose-optimization program which includes automated exposure control, adjustment of the mA and/or kV according to patient size and/or use of iterative reconstruction technique. CONTRAST:  OMNIPAQUE IOHEXOL 300 MG/ML  SOLN COMPARISON:  CT abdomen pelvis dated September 16, 2022. FINDINGS: Lower chest: No acute abnormality.  Bibasilar atelectasis. Hepatobiliary: Unchanged small hemangioma in the right liver. No new focal liver abnormality. Percutaneous cholecystostomy tube remains in place. Slightly increased distention of the gallbladder compared to prior study. Unchanged mild pericholecystic inflammatory change. No biliary dilatation. Pancreas: Unremarkable. No pancreatic ductal dilatation or surrounding inflammatory changes. Spleen: Normal in size without focal abnormality. Adrenals/Urinary Tract: Adrenal glands  are unremarkable. Kidneys are normal, without renal calculi, focal lesion, or hydronephrosis. Bladder is unremarkable. Stomach/Bowel: Stomach is within normal limits. Prior appendectomy. No evidence of bowel wall thickening, distention, or inflammatory changes. Left-sided colonic diverticulosis. Vascular/Lymphatic: Aortic atherosclerosis. No enlarged abdominal or pelvic lymph nodes. Reproductive: Prostate is unremarkable. Other: No abdominal wall hernia or abnormality. No abdominopelvic ascites. No pneumoperitoneum. Musculoskeletal: No acute or significant osseous findings. IMPRESSION: 1. Percutaneous cholecystostomy tube remains in place. Slightly increased distention of the gallbladder compared to prior study with similar degree of mild pericholecystic inflammatory change. Correlate with tube function. 2.  Aortic Atherosclerosis (ICD10-I70.0). Electronically Signed   By: Obie Lang M.D.   On: 10/05/2022 17:55   DG Chest Portable 1 View  Result Date: 10/05/2022 CLINICAL DATA:  Provided history: Infection. EXAM: PORTABLE CHEST 1 VIEW COMPARISON:  Chest CT 09/16/2022. Prior chest radiographs 09/16/2022 and earlier. FINDINGS: The cardiomediastinal silhouette is unchanged. Aortic atherosclerosis. Linear opacities within the right lung base compatible with atelectasis and/or scarring. No appreciable airspace consolidation. No evidence of pleural effusion or pneumothorax. No acute osseous abnormality identified. IMPRESSION: 1. No evidence of airspace  consolidation. 2. Atelectasis and/or scarring within the right lung base. 3. Aortic Atherosclerosis (ICD10-I70.0). Electronically Signed   By: Jackey Loge D.O.   On: 10/05/2022 16:15   VAS [place] LOWER EXTREMITY VENOUS (DVT)  Result Date: 09/17/2022  Lower Venous DVT Study Patient Name:  NKOSI CORTRIGHT  Date of Exam:   09/17/2022 Medical Rec #: [id number]          Accession #:    [id number] Date of Birth: 12/17/1950           Patient Gender: M Patient Age:   22  years Exam Location:  [facility] Procedure:      VAS [place] LOWER EXTREMITY VENOUS (DVT) Referring Phys: Jon Billings REGALADO --------------------------------------------------------------------------------  Indications: Edema.  Risk Factors: None identified. Comparison Study: 09/01/2022 - BILATERAL:                   - No evidence of deep vein thrombosis seen in the lower                   extremities, bilaterally.                   -No evidence of popliteal cyst, bilaterally. Performing Technologist: Chanda Busing RVT  Examination Guidelines: A complete evaluation includes B-mode imaging, spectral Doppler, color Doppler, and power Doppler as needed of all accessible portions of each vessel. Bilateral testing is considered an integral part of a complete examination. Limited examinations for reoccurring indications may be performed as noted. The reflux portion of the exam is performed with the patient in reverse Trendelenburg.  +---------+---------------+---------+-----------+----------+--------------+ RIGHT    CompressibilityPhasicitySpontaneityPropertiesThrombus Aging +---------+---------------+---------+-----------+----------+--------------+ CFV      Full           Yes      Yes                                 +---------+---------------+---------+-----------+----------+--------------+ SFJ      Full                                                        +---------+---------------+---------+-----------+----------+--------------+ FV Prox  Full                                                        +---------+---------------+---------+-----------+----------+--------------+ FV Mid   Full                                                        +---------+---------------+---------+-----------+----------+--------------+ FV DistalFull                                                        +---------+---------------+---------+-----------+----------+--------------+ PFV      Full                                                         +---------+---------------+---------+-----------+----------+--------------+  POP      Full           Yes      Yes                                 +---------+---------------+---------+-----------+----------+--------------+ PTV      Full                                                        +---------+---------------+---------+-----------+----------+--------------+ PERO     Full                                                        +---------+---------------+---------+-----------+----------+--------------+   +---------+---------------+---------+-----------+----------+--------------+ LEFT     CompressibilityPhasicitySpontaneityPropertiesThrombus Aging +---------+---------------+---------+-----------+----------+--------------+ CFV      Full           Yes      Yes                                 +---------+---------------+---------+-----------+----------+--------------+ SFJ      Full                                                        +---------+---------------+---------+-----------+----------+--------------+ FV Prox  Full                                                        +---------+---------------+---------+-----------+----------+--------------+ FV Mid   Full                                                        +---------+---------------+---------+-----------+----------+--------------+ FV DistalFull                                                        +---------+---------------+---------+-----------+----------+--------------+ PFV      Full                                                        +---------+---------------+---------+-----------+----------+--------------+ POP      Full           Yes      Yes                                 +---------+---------------+---------+-----------+----------+--------------+  PTV      Full                                                         +---------+---------------+---------+-----------+----------+--------------+ PERO     Full                                                        +---------+---------------+---------+-----------+----------+--------------+     Summary: RIGHT: - There is no evidence of deep vein thrombosis in the lower extremity.  - No cystic structure found in the popliteal fossa.  LEFT: - There is no evidence of deep vein thrombosis in the lower extremity.  - No cystic structure found in the popliteal fossa.  *See table(s) above for measurements and observations. Electronically signed by Coral Else MD on 09/17/2022 at 10:47:26 PM.    Final    CT Angio Chest PE W and/or Wo Contrast  Addendum Date: 09/17/2022   ADDENDUM REPORT: 09/17/2022 11:11 ADDENDUM: I was asked to review case at 9:10 am on 09/17/2022 with Dr. Sunnie Nielsen. Reported pulmonary embolus of the lingula likely due to streak or motion artifact. Additionally, previously performed chest CTA dated August 29, 2022 also significantly limited due to motion artifact and previously reported pulmonary embolus is not clearly appreciated, at the very least no embolus in any segmental or more proximal vessel. Lower extremity ultrasound or repeat chest CTA could be performed for further evaluation. Electronically Signed   By: Allegra Lai M.D.   On: 09/17/2022 11:11   Result Date: 09/17/2022 CLINICAL DATA:  Pulmonary embolism suspected. High probability. New onset hypoxia and shortness of breath. Also severe abdominal pain. Recently discharged after treatment for small subsegmental arterial emboli in the left lower lobe. EXAM: CT ANGIOGRAPHY CHEST WITH CONTRAST TECHNIQUE: Multidetector CT imaging of the chest was performed using the standard protocol during bolus administration of intravenous contrast. Multiplanar CT image reconstructions and MIPs were obtained to evaluate the vascular anatomy. RADIATION DOSE REDUCTION: This exam was performed according to the  departmental dose-optimization program which includes automated exposure control, adjustment of the mA and/or kV according to patient size and/or use of iterative reconstruction technique. CONTRAST:  OMNIPAQUE IOHEXOL 350 MG/ML SOLN COMPARISON:  Portable chest today, CTA chest 08/29/2022, CTA chest 08/25/2021 FINDINGS: Cardiovascular: Previously the patient had scattered subsegmental left lower lobe arterial filling defects. These are no longer seen. There is a small-volume embolic filling defect within a single lingular subsegmental artery on series 2 axial 43 and 44 and also visible on series 5 images 63-65. No further emboli are detected. There is panchamber cardiomegaly with a slight right chamber predominance which was seen previously but no IVC reflux to suspect acute right heart strain. There is no pericardial effusion. There are heavy calcifications in the left main and LAD coronary arteries, additional calcifications and stenting in the right coronary artery and small amount of calcification in the proximal circumflex artery. Lipomatous hypertrophy of the inter-atrial septum is chronically noted as well. The pulmonary veins are nondistended. The aorta and great vessels are tortuous but normal caliber. No dissection is seen. There is moderate patchy aortic calcific plaque, less heavy scattered plaque  in the great vessels. Mediastinum/Nodes: No enlarged mediastinal, hilar, or axillary lymph nodes. Thyroid gland, trachea, and esophagus demonstrate no significant findings. Lungs/Pleura: Bilateral trace pleural effusions are similar to the last CT. No pneumothorax. There are opacities in both posterior basal lower lobes, noted to varying degrees on both prior studies, bilaterally increased today and could indicate increased atelectasis or developing pneumonia. There is a chronically elevated right hemidiaphragm and mild posterior atelectasis in the upper lobes. The lungs are otherwise clear. No nodules are  seen. The main bronchi are patent. There is chronic bronchial thickening in the lower lobes. Upper Abdomen: A percutaneous cholecystostomy drain is noted. The liver is steatotic and mildly enlarged. No other significant findings in the visualized upper abdomen. Musculoskeletal: Osteopenia and degenerative change of the spine. No acute osseous findings. No aggressive regional bone lesion. No chest wall mass is seen. Review of the MIP images confirms the above findings. IMPRESSION: 1. Positive for a single small-volume subsegmental embolus in the lingula. No other emboli are seen. 2. Subsegmental emboli in the left lower lobe noted previously have cleared in the interval. 3. Cardiomegaly without evidence of acute right heart strain or venous dilatation. Chronic slight right chamber predominance. 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 4. Aortic and coronary artery atherosclerosis and lipomatous hypertrophy of the inter-atrial septum. 5. Trace pleural effusions with increased opacities in the posterior basal lower lobes which could be atelectasis or developing pneumonia. 6. Chronic lower lobe bronchial thickening. 7. Percutaneous cholecystostomy drain. 8. Hepatic steatosis and mild hepatomegaly. 9. Osteopenia and degenerative change. Aortic Atherosclerosis (ICD10-I70.0). Electronically Signed: By: Almira Bar M.D. On: 09/16/2022 20:53   CT ABDOMEN PELVIS W CONTRAST  Result Date: 09/16/2022 CLINICAL DATA:  Right lower quadrant abdominal pain since yesterday, history of cholecystostomy tube EXAM: CT ABDOMEN AND PELVIS WITH CONTRAST TECHNIQUE: Multidetector CT imaging of the abdomen and pelvis was performed using the standard protocol following bolus administration of intravenous contrast. RADIATION DOSE REDUCTION: This exam was performed according to the departmental dose-optimization program which includes automated exposure control, adjustment of the mA and/or kV according to patient size and/or use of iterative  reconstruction technique. CONTRAST:  OMNIPAQUE IOHEXOL 350 MG/ML SOLN COMPARISON:  09/16/2022, 08/29/2022 FINDINGS: Lower chest: Bibasilar hypoventilatory changes. No acute pleural or parenchymal lung disease. Atherosclerosis of the aorta and coronary vasculature. Hepatobiliary: Stable hemangioma right lobe liver. Otherwise the liver is unremarkable. Percutaneous cholecystostomy tube is seen coiled within the gallbladder lumen, with complete decompression of the gallbladder. There is mild pericholecystic fat stranding which may be related history of cholecystitis or indwelling drain. No biliary duct dilation. Pancreas: Unremarkable. No pancreatic ductal dilatation or surrounding inflammatory changes. Spleen: Normal in size without focal abnormality. Adrenals/Urinary Tract: Calcifications of the bilateral renal hila are likely vascular. No urinary tract calculi or obstructive uropathy. Kidneys enhance normally. The adrenals and bladder are unremarkable. Stomach/Bowel: No bowel obstruction or ileus. Diverticulosis of the descending and sigmoid colon without diverticulitis. Prior appendectomy. No bowel wall thickening. Vascular/Lymphatic: Aortic atherosclerosis. No enlarged abdominal or pelvic lymph nodes. Reproductive: Prostate is unremarkable. Other: No free fluid or free intraperitoneal gas. No abdominal wall hernia. Musculoskeletal: No acute or destructive bony abnormalities. Reconstructed images demonstrate no additional findings. IMPRESSION: 1. Indwelling cholecystostomy tube without evidence of complication. 2. Diverticulosis without diverticulitis. 3.  Aortic Atherosclerosis (ICD10-I70.0). Electronically Signed   By: Sharlet Salina M.D.   On: 09/16/2022 20:36   DG Abdomen 1 View  Result Date: 09/16/2022 CLINICAL DATA:  Abdominal pain and chills. EXAM:  ABDOMEN - 1 VIEW COMPARISON:  None Available. FINDINGS: The bowel gas pattern is normal. A percutaneous biliary drainage catheter is seen overlying the  right upper quadrant. No radio-opaque calculi or other significant radiographic abnormality are seen. IMPRESSION: Percutaneous biliary drainage catheter overlying the right upper quadrant. Electronically Signed   By: Aram Candela M.D.   On: 09/16/2022 19:30   DG Chest Portable 1 View  Result Date: 09/16/2022 CLINICAL DATA:  Abdominal pain and chills EXAM: PORTABLE CHEST 1 VIEW COMPARISON:  August 28, 2022 FINDINGS: The heart size and mediastinal contours are within normal limits. There is moderate severity calcification of the aortic arch. Low lung volumes are noted with mild, diffuse, chronic appearing increased interstitial lung markings. Mild atelectasis is seen within the bilateral lung bases. There is no evidence of a pleural effusion or pneumothorax. A biliary drainage catheter is seen overlying the lateral aspect of the right upper quadrant. Multilevel degenerative changes are seen throughout the thoracic spine. IMPRESSION: Low lung volumes with mild bibasilar atelectasis. Electronically Signed   By: Aram Candela M.D.   On: 09/16/2022 19:29   IR CHOLANGIOGRAM EXISTING TUBE  Result Date: 09/16/2022 INDICATION: History of acute cholecystitis, post ultrasound cholecystostomy tube placement on 08/30/2022. EXAM: CHOLECYSTOSTOMY TUBE EVALUATION and ANTEGRADE CHOLANGIOGRAM COMPARISON:  IR fluoroscopy, 08/30/2022.  CTA chest, 08/29/2022. MEDICATIONS: None ANESTHESIA/SEDATION: None CONTRAST:  5mL OMNIPAQUE IOHEXOL 300 MG/ML SOLN - administered into the gallbladder fossa. FLUOROSCOPY TIME:  Fluoroscopic dose; 4 mGy COMPLICATIONS: None immediate. PROCEDURE: The patient was positioned supine on the fluoroscopy table. A preprocedural spot fluoroscopic image was obtained of the right upper abdominal quadrant existing cholecystostomy tube. Multiple spot fluoroscopic radiographic images were obtained of the RIGHT upper abdominal quadrant an existing cholecystostomy tube following injection of a small amount of  contrast. Images were reviewed and discussed with the patient. The cholecystostomy tube was flushed with a small amount of saline. The previous stitch was removed, and a new 2-0 Ethilon stitch was placed. A dressing was placed. The patient tolerated the procedure well without immediate postprocedural complication. Procedure assisted by, Loran Senters IR RT FINDINGS: Preprocedural spot fluoroscopic image of the right upper abdominal quadrant demonstrates grossly unchanged positioning of the cholecystostomy tube with end coiled and locked overlying the expected location of the gallbladder fundus. Subsequent contrast injection demonstrates appropriate functionality of the cholecystostomy tube with brisk opacification of the gallbladder. There is passage of contrast from the gallbladder through the cystic and common bile ducts the level duodenum. No biliary ductal filling defect to suggest the presence of choledocholithiasis. IMPRESSION: 1. Appropriately positioned and functioning cholecystostomy tube. No exchange performed. 2. Patent cystic and common bile ducts. No fluoroscopic evidence of choledocholithiasis. PLAN: The patient will return to Vascular [facility]iology (VIR) for routine drainage catheter evaluation and exchange in 6 weeks (from catheter placement). Roanna Banning, MD [facility] Electronically Signed   By: Roanna Banning M.D.   On: 09/16/2022 14:15    Micro Results    Recent Results (from the past 240 hour(s))  Resp panel by RT-PCR (RSV, Flu A&B, Covid) Anterior Nasal Swab     Status: None   Collection Time: 10/05/22  3:51 PM   Specimen: Anterior Nasal Swab  Result Value Ref Range Status   SARS Coronavirus 2 by RT PCR NEGATIVE NEGATIVE Final    Comment: (NOTE) SARS-CoV-2 target nucleic acids are NOT DETECTED.  The SARS-CoV-2 RNA is generally detectable in upper respiratory specimens during the acute phase of infection.  The  lowest concentration of SARS-CoV-2 viral copies this assay can detect is 138 copies/mL. A negative result does not preclude SARS-Cov-2 infection and should not be used as the sole basis for treatment or other patient management decisions. A negative result may occur with  improper specimen collection/handling, submission of specimen other than nasopharyngeal swab, presence of viral mutation(s) within the areas targeted by this assay, and inadequate number of viral copies(<138 copies/mL). A negative result must be combined with clinical observations, patient history, and epidemiological information. The expected result is Negative.  Fact Sheet for Patients:  BloggerCourse.com  Fact Sheet for Healthcare Providers:  SeriousBroker.it  This test is no t yet approved or cleared by the [place] FDA and  has been authorized for detection and/or diagnosis of SARS-CoV-2 by FDA under an Emergency Use Authorization (EUA). This EUA will remain  in effect (meaning this test can be used) for the duration of the COVID-19 declaration under Section 564(b)(1) of the Act, 21 U.S.C.section 360bbb-3(b)(1), unless the authorization is terminated  or revoked sooner.       Influenza A by PCR NEGATIVE NEGATIVE Final   Influenza B by PCR NEGATIVE NEGATIVE Final    Comment: (NOTE) The Xpert Xpress SARS-CoV-2/FLU/RSV plus assay is intended as an aid in the diagnosis of influenza from Nasopharyngeal swab specimens and should not be used as a sole basis for treatment. Nasal washings and aspirates are unacceptable for Xpert Xpress SARS-CoV-2/FLU/RSV testing.  Fact Sheet for Patients: BloggerCourse.com  Fact Sheet for Healthcare Providers: SeriousBroker.it  This test is not yet approved or cleared by the [place] FDA and has been authorized for detection and/or diagnosis of SARS-CoV-2 by FDA under  an Emergency Use Authorization (EUA). This EUA will remain in effect (meaning this test can be used) for the duration of the COVID-19 declaration under Section 564(b)(1) of the Act, 21 U.S.C. section 360bbb-3(b)(1), unless the authorization is terminated or revoked.     Resp Syncytial Virus by PCR NEGATIVE NEGATIVE Final    Comment: (NOTE) Fact Sheet for Patients: BloggerCourse.com  Fact Sheet for Healthcare Providers: SeriousBroker.it  This test is not yet approved or cleared by the [place] FDA and has been authorized for detection and/or diagnosis of SARS-CoV-2 by FDA under an Emergency Use Authorization (EUA). This EUA will remain in effect (meaning this test can be used) for the duration of the COVID-19 declaration under Section 564(b)(1) of the Act, 21 U.S.C. section 360bbb-3(b)(1), unless the authorization is terminated or revoked.  Performed at [facility], [address] [place], [place] [address]   Blood culture (routine x 2)     Status: Abnormal   Collection Time: 10/05/22  4:00 PM   Specimen: BLOOD RIGHT FOREARM  Result Value Ref Range Status   Specimen Description   Final    BLOOD RIGHT FOREARM Performed at [facility], [address] [place], [place] [address]    Special Requests   Final    BOTTLES DRAWN AEROBIC AND ANAEROBIC Blood Culture adequate volume Performed at [facility], [address] [place], [place] [address]    Culture  Setup Time   Final    GRAM NEGATIVE RODS AEROBIC BOTTLE ONLY CRITICAL RESULT CALLED TO, READ BACK BY AND VERIFIED WITH: PHARMD E.SINCALIR AT 0905 ON 10/06/2022 BY T.SAAD. Performed at [facility], [address], [place], [place] [address]    Culture KLEBSIELLA PNEUMONIAE (A)  Final   Report Status 10/08/2022  FINAL  Final   Organism ID, Bacteria KLEBSIELLA PNEUMONIAE  Final      Susceptibility   Klebsiella pneumoniae - MIC*     AMPICILLIN RESISTANT Resistant     CEFEPIME <=0.12 SENSITIVE Sensitive     CEFTAZIDIME <=1 SENSITIVE Sensitive     CEFTRIAXONE <=0.25 SENSITIVE Sensitive     CIPROFLOXACIN <=0.25 SENSITIVE Sensitive     GENTAMICIN <=1 SENSITIVE Sensitive     IMIPENEM <=0.25 SENSITIVE Sensitive     TRIMETH/SULFA <=20 SENSITIVE Sensitive     AMPICILLIN/SULBACTAM 4 SENSITIVE Sensitive     PIP/TAZO <=4 SENSITIVE Sensitive     * KLEBSIELLA PNEUMONIAE  Blood Culture ID Panel (Reflexed)     Status: Abnormal   Collection Time: 10/05/22  4:00 PM  Result Value Ref Range Status   Enterococcus faecalis NOT DETECTED NOT DETECTED Final   Enterococcus Faecium NOT DETECTED NOT DETECTED Final   Listeria monocytogenes NOT DETECTED NOT DETECTED Final   Staphylococcus species NOT DETECTED NOT DETECTED Final   Staphylococcus aureus (BCID) NOT DETECTED NOT DETECTED Final   Staphylococcus epidermidis NOT DETECTED NOT DETECTED Final   Staphylococcus lugdunensis NOT DETECTED NOT DETECTED Final   Streptococcus species NOT DETECTED NOT DETECTED Final   Streptococcus agalactiae NOT DETECTED NOT DETECTED Final   Streptococcus pneumoniae NOT DETECTED NOT DETECTED Final   Streptococcus pyogenes NOT DETECTED NOT DETECTED Final   A.calcoaceticus-baumannii NOT DETECTED NOT DETECTED Final   Bacteroides fragilis NOT DETECTED NOT DETECTED Final   Enterobacterales DETECTED (A) NOT DETECTED Final    Comment: Enterobacterales represent a large order of gram negative bacteria, not a single organism. CRITICAL RESULT CALLED TO, READ BACK BY AND VERIFIED WITH: PHARMD E.SINCALIR AT 0905 ON 10/06/2022 BY T.SAAD.    Enterobacter cloacae complex NOT DETECTED NOT DETECTED Final   Escherichia coli NOT DETECTED NOT DETECTED Final   Klebsiella aerogenes NOT DETECTED NOT DETECTED Final   Klebsiella oxytoca NOT DETECTED NOT DETECTED Final   Klebsiella pneumoniae DETECTED (A) NOT DETECTED Final    Comment: CRITICAL RESULT CALLED TO, READ BACK BY AND  VERIFIED WITH: PHARMD E.SINCALIR AT 0905 ON 10/06/2022 BY T.SAAD.    Proteus species NOT DETECTED NOT DETECTED Final   Salmonella species NOT DETECTED NOT DETECTED Final   Serratia marcescens NOT DETECTED NOT DETECTED Final   Haemophilus influenzae NOT DETECTED NOT DETECTED Final   Neisseria meningitidis NOT DETECTED NOT DETECTED Final   Pseudomonas aeruginosa NOT DETECTED NOT DETECTED Final   Stenotrophomonas maltophilia NOT DETECTED NOT DETECTED Final   Candida albicans NOT DETECTED NOT DETECTED Final   Candida auris NOT DETECTED NOT DETECTED Final   Candida glabrata NOT DETECTED NOT DETECTED Final   Candida krusei NOT DETECTED NOT DETECTED Final   Candida parapsilosis NOT DETECTED NOT DETECTED Final   Candida tropicalis NOT DETECTED NOT DETECTED Final   Cryptococcus neoformans/gattii NOT DETECTED NOT DETECTED Final   CTX-M ESBL NOT DETECTED NOT DETECTED Final   Carbapenem resistance IMP NOT DETECTED NOT DETECTED Final   Carbapenem resistance KPC NOT DETECTED NOT DETECTED Final   Carbapenem resistance NDM NOT DETECTED NOT DETECTED Final   Carbapenem resist OXA 48 LIKE NOT DETECTED NOT DETECTED Final   Carbapenem resistance VIM NOT DETECTED NOT DETECTED Final    Comment: Performed at [facility], [address] [place], [place] [address]  Blood culture (routine x 2)     Status: None (Preliminary result)   Collection Time: 10/05/22  4:10 PM   Specimen: BLOOD RIGHT ARM  Result Value Ref Range Status   Specimen Description   Final    BLOOD RIGHT ARM Performed at [facility], [address], [place], [place] [address]    Special Requests   Final    BOTTLES DRAWN AEROBIC AND ANAEROBIC Blood Culture adequate volume Performed at [facility], [address] [place], [place] [address]    Culture   Final    NO GROWTH 4 DAYS Performed at [facility], [address] [place], [place] [address]    Report Status PENDING  Incomplete    Today    Subjective    Robert Lang today has no headache,no chest abdominal pain,no new weakness tingling or numbness, feels much better wants to go home today.    Objective   Blood pressure 118/70, pulse 89, temperature 97.7 F (36.5 C), temperature source Oral, resp. rate 18, height 5\' 11"  (1.803 m), weight 90.3 kg, SpO2 95%.   Intake/Output Summary (Last 24 hours) at 10/09/2022 0908 Last data filed at 10/08/2022 2000 Gross per 24 hour  Intake --  Output 710 ml  Net -710 ml    Exam  Awake Alert, No new F.N deficits,    [place].AT,PERRAL Supple Neck,   Symmetrical Chest wall movement, Good air movement bilaterally, CTAB RRR,No Gallops,   +ve B.Sounds, Abd Soft, Non tender, right upper quadrant drain functioning well. No Cyanosis, Clubbing or edema    Data Review   Recent Labs  Lab 10/05/22 1538 10/06/22 0524 10/07/22 0443 10/08/22 0042 10/09/22 0332  WBC 13.2* 9.9 8.1 6.7 7.5  HGB 15.1 12.1* 11.2* 11.0* 11.5*  HCT 45.5 37.0* 35.8* 34.2* 35.8*  PLT 252 171 159 145* 173  MCV 92.3 93.4 96.5 94.5 93.7  MCH 30.6 30.6 30.2 30.4 30.1  MCHC 33.2 32.7 31.3 32.2 32.1  RDW 14.6 14.7 14.9 14.4 14.3  LYMPHSABS 1.3  --  1.9 1.6 1.1  MONOABS 1.6*  --  1.2* 0.9 1.0  EOSABS 0.0  --  0.0 0.0 0.0  BASOSABS 0.0  --  0.0 0.0 0.0    Recent Labs  Lab 10/05/22 1538 10/05/22 1551 10/05/22 1811 10/05/22 1958 10/06/22 0520 10/06/22 0524 10/06/22 1001 10/07/22 0443 10/08/22 0042 10/09/22 0332  NA 133*  --   --   --   --  134*  --  133* 135 138  K 4.1  --   --   --   --  4.3  --  3.7 3.5 4.0  CL 101  --   --   --   --  103  --  103 103 101  CO2 22  --   --   --   --  21*  --  25 25 27   ANIONGAP 10  --   --   --   --  10  --  5 7 10   GLUCOSE 108*  --   --   --   --  104*  --  102* 109* 106*  BUN 12  --   --   --   --  11  --  10 7* <5*  CREATININE 0.91  --   --   --   --  0.91  --  0.91 0.80 0.73  AST 21  --   --   --   --  21  --  14* 15 15  ALT 23  --   --   --   --  23  --  16 17 16   ALKPHOS 78  --   --   --   --  62  --  52 51 49  BILITOT 0.5  --   --   --   --  0.5  --  0.7 0.3 0.4  ALBUMIN 3.5  --   --   --   --  2.5*  --  2.3* 2.6* 2.5*  CRP  --   --   --   --  8.4*  --   --  17.2* 13.6* 7.7*  PROCALCITON  --   --   --   --   --  0.20  --  0.10 <0.10 <0.10  LATICACIDVEN 2.5*  --  3.3* 1.6  --   --   --   --   --   --   INR  --  1.2  --   --   --   --  1.4*  --   --   --   BNP  --   --   --   --   --   --   --  309.0* 318.2* 330.7*  MG  --   --   --   --   --  1.6*  --  1.7 2.0 2.0  CALCIUM 8.7*  --   --   --   --  8.0*  --  8.0* 8.4* 8.5*    Total Time in preparing paper work, data evaluation and todays exam - 35 minutes  Signature  -    Susa Raring M.D on 10/09/2022 at 9:08 AM   -  To page go to [email or web address]

## 2022-10-09 NOTE — Plan of Care (Signed)
  Problem: Education: Goal: Knowledge of General Education information will improve Description: Including pain rating scale, medication(s)/side effects and non-pharmacologic comfort measures Outcome: Completed/Met   Problem: Health Behavior/Discharge Planning: Goal: Ability to manage health-related needs will improve Outcome: Completed/Met   Problem: Clinical Measurements: Goal: Ability to maintain clinical measurements within normal limits will improve Outcome: Completed/Met Goal: Will remain free from infection Outcome: Completed/Met Goal: Diagnostic test results will improve Outcome: Completed/Met Goal: Respiratory complications will improve Outcome: Completed/Met Goal: Cardiovascular complication will be avoided Outcome: Completed/Met   Problem: Activity: Goal: Risk for activity intolerance will decrease Outcome: Completed/Met   Problem: Nutrition: Goal: Adequate nutrition will be maintained Outcome: Completed/Met   Problem: Coping: Goal: Level of anxiety will decrease Outcome: Completed/Met   Problem: Elimination: Goal: Will not experience complications related to bowel motility Outcome: Completed/Met Goal: Will not experience complications related to urinary retention Outcome: Completed/Met   Problem: Pain Managment: Goal: General experience of comfort will improve Outcome: Completed/Met   Problem: Safety: Goal: Ability to remain free from injury will improve Outcome: Completed/Met   Problem: Skin Integrity: Goal: Risk for impaired skin integrity will decrease Outcome: Completed/Met   Problem: Fluid Volume: Goal: Hemodynamic stability will improve Outcome: Completed/Met   Problem: Clinical Measurements: Goal: Diagnostic test results will improve Outcome: Completed/Met Goal: Signs and symptoms of infection will decrease Outcome: Completed/Met   Problem: Respiratory: Goal: Ability to maintain adequate ventilation will improve Outcome:  Completed/Met

## 2022-10-10 DIAGNOSIS — I330 Acute and subacute infective endocarditis: Secondary | ICD-10-CM | POA: Diagnosis not present

## 2022-10-10 DIAGNOSIS — F418 Other specified anxiety disorders: Secondary | ICD-10-CM | POA: Diagnosis not present

## 2022-10-10 DIAGNOSIS — J432 Centrilobular emphysema: Secondary | ICD-10-CM | POA: Diagnosis not present

## 2022-10-10 DIAGNOSIS — J9601 Acute respiratory failure with hypoxia: Secondary | ICD-10-CM | POA: Diagnosis not present

## 2022-10-10 DIAGNOSIS — E7800 Pure hypercholesterolemia, unspecified: Secondary | ICD-10-CM | POA: Diagnosis not present

## 2022-10-10 DIAGNOSIS — I2510 Atherosclerotic heart disease of native coronary artery without angina pectoris: Secondary | ICD-10-CM | POA: Diagnosis not present

## 2022-10-10 DIAGNOSIS — I4892 Unspecified atrial flutter: Secondary | ICD-10-CM | POA: Diagnosis not present

## 2022-10-10 DIAGNOSIS — J84112 Idiopathic pulmonary fibrosis: Secondary | ICD-10-CM | POA: Diagnosis not present

## 2022-10-10 DIAGNOSIS — I10 Essential (primary) hypertension: Secondary | ICD-10-CM | POA: Diagnosis not present

## 2022-10-10 DIAGNOSIS — G629 Polyneuropathy, unspecified: Secondary | ICD-10-CM | POA: Diagnosis not present

## 2022-10-10 DIAGNOSIS — J441 Chronic obstructive pulmonary disease with (acute) exacerbation: Secondary | ICD-10-CM | POA: Diagnosis not present

## 2022-10-10 DIAGNOSIS — I452 Bifascicular block: Secondary | ICD-10-CM | POA: Diagnosis not present

## 2022-10-10 DIAGNOSIS — F1010 Alcohol abuse, uncomplicated: Secondary | ICD-10-CM | POA: Diagnosis not present

## 2022-10-10 DIAGNOSIS — I480 Paroxysmal atrial fibrillation: Secondary | ICD-10-CM | POA: Diagnosis not present

## 2022-10-10 DIAGNOSIS — I252 Old myocardial infarction: Secondary | ICD-10-CM | POA: Diagnosis not present

## 2022-10-10 DIAGNOSIS — I2693 Single subsegmental pulmonary embolism without acute cor pulmonale: Secondary | ICD-10-CM | POA: Diagnosis not present

## 2022-10-10 LAB — CULTURE, BLOOD (ROUTINE X 2)

## 2022-10-11 ENCOUNTER — Inpatient Hospital Stay (HOSPITAL_COMMUNITY)
Admission: RE | Admit: 2022-10-11 | Discharge: 2022-10-11 | Disposition: A | Discharge status: Home / Self Care | Payer: Medicare Other | Source: Ambulatory Visit | Attending: Interventional Radiology | Admitting: Interventional Radiology

## 2022-10-11 ENCOUNTER — Telehealth: Payer: Self-pay

## 2022-10-11 NOTE — Transitions of Care (Post Inpatient/ED Visit) (Signed)
10/11/2022  Name: Robert Lang MRN: [id number] DOB: [birth year]  Today's TOC FU Call Status: Today's TOC FU Call Status:: Successful TOC FU Call Competed TOC FU Call Complete Date: 10/11/22  Transition Care Management Follow-up Telephone Call Date of Discharge: 10/09/22 Discharge Facility: [facility]) Type of Discharge: Inpatient Admission Primary Inpatient Discharge Diagnosis:: PE How have you been since you were released from the hospital?: Better Any questions or concerns?: No  Items Reviewed: Did you receive and understand the discharge instructions provided?: Yes Medications obtained,verified, and reconciled?: Yes (Medications Reviewed) Any new allergies since your discharge?: No Dietary orders reviewed?: Yes Do you have support at home?: Yes People in Home: spouse  Medications Reviewed Today: Medications Reviewed Today     Reviewed by Karena Addison, LPN (Licensed Practical Nurse) on 10/11/22 at 1512  Med List Status: <None>   Medication Order Taking? Sig Documenting Provider Last Dose Status Informant  acetaminophen (TYLENOL) 500 MG tablet [id number]  Take 1 tablet (500 mg total) by mouth every 8 (eight) hours as needed for moderate pain. Leroy Sea, MD  Active   apixaban (ELIQUIS) 5 MG TABS tablet [id number] No Take 1 tablet (5 mg total) by mouth 2 (two) times daily. Alba Cory, MD 10/05/2022 0830 Active Spouse/Significant Other  atorvastatin (LIPITOR) 80 MG tablet [id number] No Take 1 tablet (80 mg total) by mouth daily.  Patient taking differently: Take 80 mg by mouth every evening.   Georgeanna Lea, MD 10/05/2022 Active Spouse/Significant Other  buPROPion (WELLBUTRIN XL) 300 MG 24 hr tablet [id number] No Take 1 tablet (300 mg total) by mouth daily. Joan Flores, NP 10/05/2022 Active Spouse/Significant Other  busPIRone (BUSPAR) 15 MG tablet [id number] No Take 1 tablet (15 mg total) by mouth 3 (three) times daily. Joan Flores, NP 10/05/2022  Active Spouse/Significant Other  Cholecalciferol (VITAMIN D3) 25 MCG (1000 UT) CAPS [id number] No Take 2 capsules by mouth 2 (two) times daily. [provider] 10/05/2022 Active Spouse/Significant Other  docusate sodium (COLACE) 100 MG capsule [id number] No Take 100 mg by mouth daily. [provider] 10/05/2022 Active Spouse/Significant Other  famotidine (PEPCID) 40 MG tablet [id number] No TAKE 1 TABLET(40 MG) BY MOUTH AT BEDTIME  Patient taking differently: Take 40 mg by mouth at bedtime.   Georgeanna Lea, MD 10/05/2022 Active Spouse/Significant Other  fexofenadine (ALLEGRA) 180 MG tablet [id number] No Take 180 mg by mouth at bedtime.  [provider] 10/05/2022 Active Spouse/Significant Other  finasteride (PROSCAR) 5 MG tablet [id number] No Take 1 tablet (5 mg total) by mouth daily. Georgeanna Lea, MD 10/05/2022 Active Spouse/Significant Other  guaiFENesin (MUCINEX) 600 MG 12 hr tablet [id number] No Take 2 tablets (1,200 mg total) by mouth 2 (two) times daily. Hartley Barefoot A, MD 10/05/2022 Active Spouse/Significant Other  lamoTRIgine (LAMICTAL) 100 MG tablet [id number] No Take 1 tablet (100 mg total) by mouth daily.  Patient taking differently: Take 100 mg by mouth every evening.   Joan Flores, NP 10/05/2022 Active Spouse/Significant Other           Med Note (SATTERFIELD, DARIUS E   Wed Oct 06, 2022  7:25 PM)    levalbuterol Pauline Aus) 1.25 MG/0.5ML nebulizer solution [id number] No Take 1.25 mg by nebulization every 4 (four) hours as needed for wheezing or shortness of breath. Alba Cory, MD 10/05/2022 Active Spouse/Significant Other  levofloxacin (LEVAQUIN) 750 MG tablet [id number]  Take 1 tablet (750 mg total) by mouth daily. Leroy Sea, MD  Active  LORazepam (ATIVAN) 0.5 MG tablet [id number] No Take one tablet by mouth only for severe anxiety or agitation  Patient taking differently: Take 0.5 mg by mouth daily as needed for anxiety. Take one tablet by  mouth only for severe anxiety or agitation. Has not taking medication yet.   Joan Flores, NP Past Week Active Spouse/Significant Other           Med Note (SATTERFIELD, Genoveva Ill   Wed Oct 06, 2022  7:26 PM)    Melatonin 10 MG TABS [id number] No Take 1 tablet by mouth at bedtime. [provider] 10/05/2022 Active Spouse/Significant Other  Multiple Vitamins-Minerals (PRESERVISION AREDS PO) [id number] No Take 1 capsule by mouth in the morning and at bedtime. [provider] 10/05/2022 Active Spouse/Significant Other  nitroGLYCERIN (NITROSTAT) 0.4 MG SL tablet [id number] No Place 1 tablet (0.4 mg total) under the tongue every 5 (five) minutes as needed for chest pain. Georgeanna Lea, MD unknown Active Spouse/Significant Other  ondansetron (ZOFRAN) 4 MG tablet [id number]  Take 1 tablet (4 mg total) by mouth every 8 (eight) hours as needed for nausea or vomiting. Leroy Sea, MD  Active   oxyCODONE (OXY IR/ROXICODONE) 5 MG immediate release tablet [id number]  Take 1 tablet (5 mg total) by mouth every 12 (twelve) hours as needed for severe pain. Leroy Sea, MD  Active   pantoprazole (PROTONIX) 40 MG tablet [id number] No TAKE 1 TABLET(40 MG) BY MOUTH DAILY  Patient taking differently: Take 40 mg by mouth daily.   Lynann Bologna, MD 10/05/2022 Active Spouse/Significant Other  Pirfenidone (ESBRIET) 801 MG TABS [id number] No Take 1 tablet (801 mg total) by mouth 3 (three) times daily. Kalman Shan, MD 10/05/2022 Active Spouse/Significant Other           Med Note (SATTERFIELD, DARIUS E   Wed Oct 06, 2022  7:29 PM) Spouse states she will be bringing in this medication tomorrow (10-06-22) medication is very expensive per spouse   Polyethylene Glycol 3350 (MIRALAX PO) [id number] No Take 17 g by mouth daily. [provider] 10/05/2022 Active Spouse/Significant Other  pregabalin (LYRICA) 150 MG capsule [id number] No Take 1 capsule (150 mg total) by mouth 2 (two) times daily.  Windell Norfolk, MD 10/05/2022 Active Spouse/Significant Other  ranolazine (RANEXA) 1000 MG SR tablet [id number] No Take 1 tablet (1,000 mg total) by mouth 2 (two) times daily. Georgeanna Lea, MD 10/05/2022 Active Spouse/Significant Other  sertraline (ZOLOFT) 100 MG tablet [id number] No Take 1.5 tablets (150 mg total) by mouth at bedtime. Joan Flores, NP 10/05/2022 Active Spouse/Significant Other  Sodium Chloride Flush (NORMAL SALINE FLUSH) 0.9 % SOLN [id number] No Flush drain with 5 mLs daily. Discard remaining 5ml in each syringe. Burnadette Pop, MD 10/05/2022 Active Spouse/Significant Other  traZODone (DESYREL) 50 MG tablet [id number]  TAKE 1 TABLET(50 MG) BY MOUTH AT BEDTIME AS NEEDED FOR SLEEP. MAY TAKE 1/2 TABLET TO 1 TABLET FOR SLEEP, AS NEEDED White, Brian A, NP  Active   umeclidinium-vilanterol (ANORO ELLIPTA) 62.5-25 MCG/ACT AEPB [id number] No Inhale 1 puff into the lungs daily. Burnadette Pop, MD 10/05/2022 Active Spouse/Significant Other  vitamin B-12 (CYANOCOBALAMIN) 1000 MCG tablet [id number] No Take 1,000 mcg by mouth daily. [provider] 10/05/2022 Active Spouse/Significant Other  zolpidem (AMBIEN) 5 MG tablet [id number] No Take 1 tablet (5 mg total) by mouth at bedtime as needed for sleep. Windell Norfolk, MD Past Week Active Spouse/Significant Other            Home Care  and Equipment/Supplies: Were Home Health Services Ordered?: NA Has Agency set up a time to come to your home?: No Any new equipment or medical supplies ordered?: NA Were you able to get the equipment/medical supplies?: No Do you have any questions related to the use of the equipment/supplies?: No  Functional Questionnaire: Do you need assistance with bathing/showering or dressing?: No Do you need assistance with meal preparation?: No Do you need assistance with eating?: No Do you have difficulty maintaining continence: No Do you need assistance with getting out of bed/getting out of a chair/moving?:  No Do you have difficulty managing or taking your medications?: No  Follow up appointments reviewed: PCP Follow-up appointment confirmed?: NA Specialist Hospital Follow-up appointment confirmed?: Yes Date of Specialist follow-up appointment?: 10/12/22 Follow-Up Specialty Provider:: cardio Do you need transportation to your follow-up appointment?: No Do you understand care options if your condition(s) worsen?: Yes-patient verbalized understanding    SIGNATURE Karena Addison, LPN [facility] Nurse Health Advisor Direct Dial [phone]

## 2022-10-12 ENCOUNTER — Encounter: Payer: Self-pay | Admitting: Cardiology

## 2022-10-12 ENCOUNTER — Ambulatory Visit: Payer: Medicare Other | Attending: Cardiology | Admitting: Cardiology

## 2022-10-12 VITALS — BP 100/62 | HR 79 | Ht 71.0 in | Wt 203.8 lb

## 2022-10-12 DIAGNOSIS — I480 Paroxysmal atrial fibrillation: Secondary | ICD-10-CM | POA: Diagnosis not present

## 2022-10-12 DIAGNOSIS — I2693 Single subsegmental pulmonary embolism without acute cor pulmonale: Secondary | ICD-10-CM | POA: Diagnosis not present

## 2022-10-12 DIAGNOSIS — R0609 Other forms of dyspnea: Secondary | ICD-10-CM | POA: Diagnosis not present

## 2022-10-12 DIAGNOSIS — I10 Essential (primary) hypertension: Secondary | ICD-10-CM | POA: Diagnosis not present

## 2022-10-12 DIAGNOSIS — I252 Old myocardial infarction: Secondary | ICD-10-CM | POA: Diagnosis not present

## 2022-10-12 DIAGNOSIS — J84112 Idiopathic pulmonary fibrosis: Secondary | ICD-10-CM | POA: Diagnosis not present

## 2022-10-12 DIAGNOSIS — I452 Bifascicular block: Secondary | ICD-10-CM | POA: Diagnosis not present

## 2022-10-12 DIAGNOSIS — R6520 Severe sepsis without septic shock: Secondary | ICD-10-CM | POA: Diagnosis not present

## 2022-10-12 DIAGNOSIS — F411 Generalized anxiety disorder: Secondary | ICD-10-CM | POA: Diagnosis not present

## 2022-10-12 DIAGNOSIS — F1010 Alcohol abuse, uncomplicated: Secondary | ICD-10-CM | POA: Diagnosis not present

## 2022-10-12 DIAGNOSIS — G4761 Periodic limb movement disorder: Secondary | ICD-10-CM | POA: Diagnosis not present

## 2022-10-12 DIAGNOSIS — J690 Pneumonitis due to inhalation of food and vomit: Secondary | ICD-10-CM | POA: Diagnosis not present

## 2022-10-12 DIAGNOSIS — J441 Chronic obstructive pulmonary disease with (acute) exacerbation: Secondary | ICD-10-CM | POA: Diagnosis not present

## 2022-10-12 DIAGNOSIS — Z01810 Encounter for preprocedural cardiovascular examination: Secondary | ICD-10-CM | POA: Diagnosis not present

## 2022-10-12 DIAGNOSIS — G4733 Obstructive sleep apnea (adult) (pediatric): Secondary | ICD-10-CM | POA: Diagnosis not present

## 2022-10-12 DIAGNOSIS — I4892 Unspecified atrial flutter: Secondary | ICD-10-CM | POA: Diagnosis not present

## 2022-10-12 DIAGNOSIS — G629 Polyneuropathy, unspecified: Secondary | ICD-10-CM | POA: Diagnosis not present

## 2022-10-12 DIAGNOSIS — E7800 Pure hypercholesterolemia, unspecified: Secondary | ICD-10-CM | POA: Diagnosis not present

## 2022-10-12 DIAGNOSIS — I330 Acute and subacute infective endocarditis: Secondary | ICD-10-CM | POA: Diagnosis not present

## 2022-10-12 DIAGNOSIS — I2510 Atherosclerotic heart disease of native coronary artery without angina pectoris: Secondary | ICD-10-CM | POA: Diagnosis not present

## 2022-10-12 DIAGNOSIS — M1990 Unspecified osteoarthritis, unspecified site: Secondary | ICD-10-CM | POA: Diagnosis not present

## 2022-10-12 DIAGNOSIS — A419 Sepsis, unspecified organism: Secondary | ICD-10-CM | POA: Diagnosis not present

## 2022-10-12 DIAGNOSIS — J9601 Acute respiratory failure with hypoxia: Secondary | ICD-10-CM | POA: Diagnosis not present

## 2022-10-12 DIAGNOSIS — K219 Gastro-esophageal reflux disease without esophagitis: Secondary | ICD-10-CM | POA: Diagnosis not present

## 2022-10-12 DIAGNOSIS — J432 Centrilobular emphysema: Secondary | ICD-10-CM | POA: Diagnosis not present

## 2022-10-12 DIAGNOSIS — J301 Allergic rhinitis due to pollen: Secondary | ICD-10-CM | POA: Diagnosis not present

## 2022-10-12 DIAGNOSIS — N400 Enlarged prostate without lower urinary tract symptoms: Secondary | ICD-10-CM | POA: Diagnosis not present

## 2022-10-12 NOTE — Progress Notes (Signed)
Cardiology Office Note:    Date:  10/12/2022   ID:  Robert Lang, DOB July 09, 1950, MRN [id number]  PCP:  [name], [name]  Cardiologist:  Gypsy Balsam, MD    Referring MD: [name], [place]   Chief Complaint  Patient presents with   Hospitalization Follow-up   Medical Clearance    Gall bladder removal with Dr. Sheliah Hatch    History of Present Illness:    Robert Lang is a 72 y.o. male with complex past medical history that in clued coronary disease, status post PTCA to LAD and RCA, atrial flutter status post ablation 2017, essential hypertension, dyslipidemia.  Last cardiac catheterization done in [facility] in 2017 showed patent stent in proximal LAD, there was 90% occlusion of the small first diagonal branch, 25% of RCA ejection fraction was good at that time.  Last intervention performed was on 01/10/2019 with PTCA and stenting of the right coronary artery.  Recently he ended up having pulmonary emboli in June put on anticoagulation, he was also found to have acute cholecystitis.  There was treated with a drain however so far since that twice he had to end up going to the hospital because of occlusion of drain and sepsis.  He was referred to [place] for evaluation before potential surgery for his gallbladder.  He is struggling a lot with the drain he does not like that he would like to have surgery done as quickly as possible but he does have a lot of additional problem include extremity Perry pulmonary fibrosis.  He denies hemorrhage typical chest pain tightness squeezing pressure burning chest but at the same time his ability to exercise is very limited.  He lives very sedentary lifestyle.  He is on oxygen also from cardiac standpoint review will investigate some lesions noted on the on the aortic valve with question about endocarditis.  However, at the time we realized there is a problem with aortic valve he was completely asymptomatic meaning there is no signs and symptoms of  infection now he does have history of recurrent sepsis because of cholecystitis I will want him to have echocardiogram repeated to make sure there is no new lesion of his aortic valve.  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 M75.00)   Allergic rhinitis due to pollen 09/03/2013   J30.1)  Formatting of this note might be different from the original. J30.1)   Allergy    Anticoagulated 07/01/2016   Anxiety    Arthritis    Atrial fibrillation (HCC)    Atrial flutter (HCC) 06/26/2015   CAD (coronary artery disease)    Chest pain 06/26/2015   COPD (chronic obstructive pulmonary disease) (HCC)    Coronary artery disease 07/06/2018   Cardiac catheterization 2017 showing 90% small diagonal branch disease   DDD (degenerative disc disease), cervical 09/03/2013   M50.90)  Formatting of this note might be different from the original. M50.90)   Depression    Depression    Depressive disorder 09/03/2013   Dizziness 10/28/2017   Dyslipidemia, goal LDL below 70 07/06/2018   Dyspnea on exertion 10/20/2018   Esophageal reflux 09/03/2013   Essential hypertension 07/06/2018   ETOH abuse 01/11/2019   6 pack of beer per day   Falls 10/28/2017   GERD (gastroesophageal reflux disease)    H/O amiodarone therapy 07/01/2016  Heart attack (HCC)    Heart palpitations 01/27/2017   History of colon polyps    Hyperlipidemia    Hypertension    IPF (idiopathic pulmonary fibrosis) (HCC)    Kidney stones    Neck pain 09/03/2013   Neuropathy 07/06/2018   Obstructive sleep apnea 08/05/2015   PLMD (periodic limb movement disorder) 11/04/2015   Polycythemia, 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 STORY: Due to alcohol/ tobacco    Pure hypercholesterolemia 09/03/2013   E78.0)  Formatting of this note might be different from the original. E78.0)   Screening for prostate cancer 09/03/2013   Smoker 01/11/2019   Smoking 07/06/2018   Status post ablation of atrial flutter 07/06/2018   2017    Past Surgical History:  Procedure Laterality Date   ATRIAL FIBRILLATION ABLATION  10/2015   BACK SURGERY     CARDIOVERSION  2017   CATARACT EXTRACTION Bilateral 2017   March and April 2017   COLONOSCOPY  2018   CORONARY STENT PLACEMENT  2012   CORONARY/GRAFT ACUTE MI REVASCULARIZATION N/A 01/10/2019   Procedure: CORONARY/GRAFT ACUTE MI REVASCULARIZATION;  Surgeon: Marykay Lex, MD;  Location: [facility] INVASIVE CV LAB;  Service: Cardiovascular;  Laterality: N/A;   INGUINAL HERNIA REPAIR     over 20 years ago   IR CHOLANGIOGRAM EXISTING TUBE  09/16/2022   IR EXCHANGE BILIARY DRAIN  10/06/2022   IR PERC CHOLECYSTOSTOMY  08/30/2022   LAPAROSCOPIC APPENDECTOMY N/A 01/17/2021   Procedure: APPENDECTOMY LAPAROSCOPIC;  Surgeon: Quentin Ore, MD;  Location: [facility] OR;  Service: General;  Laterality: N/A;   LEFT HEART CATH AND CORONARY ANGIOGRAPHY N/A 01/10/2019   Procedure: LEFT HEART CATH AND CORONARY ANGIOGRAPHY;  Surgeon: Marykay Lex, MD;  Location: [facility] INVASIVE CV LAB;  Service: Cardiovascular;  Laterality: N/A;   LUMBAR LAMINECTOMY Bilateral 04/05/2016   L2-L5    VENTRAL HERNIA REPAIR  2018    Current Medications: Current Meds  Medication Sig   acetaminophen (TYLENOL) 500 MG tablet Take 1 tablet (500 mg total) by mouth every 8 (eight) hours as needed for moderate pain.   apixaban (ELIQUIS) 5 MG TABS tablet Take 1 tablet (5 mg total) by mouth 2 (two) times daily.   atorvastatin (LIPITOR) 80 MG tablet Take 1 tablet (80 mg total) by mouth daily. (Patient taking differently: Take 80 mg by mouth every evening.)   buPROPion (WELLBUTRIN XL) 300 MG 24 hr tablet Take 1 tablet (300 mg total) by mouth daily.   busPIRone (BUSPAR) 15  MG tablet Take 1 tablet (15 mg total) by mouth 3 (three) times daily.   Cholecalciferol (VITAMIN D3) 25 MCG (1000 UT) CAPS Take 2 capsules by mouth 2 (two) times daily.   docusate sodium (COLACE) 100 MG capsule Take 100 mg by mouth daily.   famotidine (PEPCID) 40 MG tablet TAKE 1 TABLET(40 MG) BY MOUTH AT BEDTIME (Patient taking differently: Take 40 mg by mouth at bedtime.)   fexofenadine (ALLEGRA) 180 MG tablet Take 180 mg by mouth at bedtime.    finasteride (PROSCAR) 5 MG tablet Take 1 tablet (5 mg total) by mouth daily.   guaiFENesin (MUCINEX) 600 MG 12 hr tablet Take 2 tablets (1,200 mg total) by mouth 2 (two) times daily.   lamoTRIgine (LAMICTAL) 100 MG tablet Take 1 tablet (100 mg total) by mouth daily. (Patient taking differently: Take 100 mg by mouth every evening.)   levalbuterol (XOPENEX) 1.25 MG/0.5ML nebulizer  solution Take 1.25 mg by nebulization every 4 (four) hours as needed for wheezing or shortness of breath.   levofloxacin (LEVAQUIN) 750 MG tablet Take 1 tablet (750 mg total) by mouth daily.   LORazepam (ATIVAN) 0.5 MG tablet Take one tablet by mouth only for severe anxiety or agitation (Patient taking differently: Take 0.5 mg by mouth daily as needed for anxiety. Take one tablet by mouth only for severe anxiety or agitation. Has not taking medication yet.)   Melatonin 10 MG TABS Take 1 tablet by mouth at bedtime.   Multiple Vitamins-Minerals (PRESERVISION AREDS PO) Take 1 capsule by mouth in the morning and at bedtime.   nitroGLYCERIN (NITROSTAT) 0.4 MG SL tablet Place 1 tablet (0.4 mg total) under the tongue every 5 (five) minutes as needed for chest pain.   ondansetron (ZOFRAN) 4 MG tablet Take 1 tablet (4 mg total) by mouth every 8 (eight) hours as needed for nausea or vomiting.   oxyCODONE (OXY IR/ROXICODONE) 5 MG immediate release tablet Take 1 tablet (5 mg total) by mouth every 12 (twelve) hours as needed for severe pain.   pantoprazole (PROTONIX) 40 MG tablet TAKE 1  TABLET(40 MG) BY MOUTH DAILY (Patient taking differently: Take 40 mg by mouth daily.)   Pirfenidone (ESBRIET) 801 MG TABS Take 1 tablet (801 mg total) by mouth 3 (three) times daily.   Polyethylene Glycol 3350 (MIRALAX PO) Take 17 g by mouth daily.   pregabalin (LYRICA) 150 MG capsule Take 1 capsule (150 mg total) by mouth 2 (two) times daily.   ranolazine (RANEXA) 1000 MG SR tablet Take 1 tablet (1,000 mg total) by mouth 2 (two) times daily.   sertraline (ZOLOFT) 100 MG tablet Take 1.5 tablets (150 mg total) by mouth at bedtime.   Sodium Chloride Flush (NORMAL SALINE FLUSH) 0.9 % SOLN Flush drain with 5 mLs daily. Discard remaining 5ml in each syringe.   traZODone (DESYREL) 50 MG tablet TAKE 1 TABLET(50 MG) BY MOUTH AT BEDTIME AS NEEDED FOR SLEEP. MAY TAKE 1/2 TABLET TO 1 TABLET FOR SLEEP, AS NEEDED (Patient taking differently: Take 50 mg by mouth at bedtime as needed for sleep.)   umeclidinium-vilanterol (ANORO ELLIPTA) 62.5-25 MCG/ACT AEPB Inhale 1 puff into the lungs daily.   vitamin B-12 (CYANOCOBALAMIN) 1000 MCG tablet Take 1,000 mcg by mouth daily.   zolpidem (AMBIEN) 5 MG tablet Take 1 tablet (5 mg total) by mouth at bedtime as needed for sleep.     Allergies:   Naproxen and Silodosin   Social History   Socioeconomic History   Marital status: Married    Spouse name: Not on file   Number of children: Not on file   Years of education: Not on file   Highest education level: Not on file  Occupational History   Not on file  Tobacco Use   Smoking status: Former    Current packs/day: 0.00    Average packs/day: 1 pack/day for 30.0 years (30.0 ttl pk-yrs)    Types: Cigarettes    Start date: 01/09/1989    Quit date: 01/10/2019    Years since quitting: 3.7   Smokeless tobacco: Former  Building services engineer status: Never Used  Substance and Sexual Activity   Alcohol use: Yes    Comment: 8oz of wine a day   Drug use: Never   Sexual activity: Not on file  Other Topics Concern   Not  on file  Social History Narrative   Not on file   Social  Determinants of Health   Financial Resource Strain: Low Risk  (12/22/2021)   Overall Financial Resource Strain (CARDIA)    Difficulty of Paying Living Expenses: Not hard at all  Food Insecurity: No Food Insecurity (10/06/2022)   Hunger Vital Sign    Worried About Running Out of Food in the Last Year: Never true    Ran Out of Food in the Last Year: Never true  Transportation Needs: No Transportation Needs (10/06/2022)   PRAPARE - Administrator, Civil Service (Medical): No    Lack of Transportation (Non-Medical): No  Physical Activity: Inactive (12/22/2021)   Exercise Vital Sign    Days of Exercise per Week: 0 days    Minutes of Exercise per Session: 0 min  Stress: No Stress Concern Present (12/22/2021)   [employer] of Occupational Health - Occupational Stress Questionnaire    Feeling of Stress : Not at all  Social Connections: Moderately Integrated (12/22/2021)   Social Connection and Isolation Panel [NHANES]    Frequency of Communication with Friends and Family: More than three times a week    Frequency of Social Gatherings with Friends and Family: More than three times a week    Attends Religious Services: More than 4 times per year    Active Member of [employer] or Organizations: No    Attends Banker Meetings: Never    Marital Status: Married     Family History: The patient's family history includes Alcohol abuse in his brother and father; Anxiety disorder in his mother; Colon cancer in his father; Depression in his brother; Throat cancer in his brother. ROS:   Please see the history of present illness.    All 14 point review of systems negative except as described per history of present illness  EKGs/Labs/Other Studies Reviewed:        Recent Labs: 10/09/2022: ALT 16; B Natriuretic Peptide 330.7; BUN <5; Creatinine, Ser 0.73; Hemoglobin 11.5; Magnesium 2.0; Platelets 173; Potassium 4.0;  Sodium 138  Recent Lipid Panel    Component Value Date/Time   CHOL 144 12/30/2021 0944   CHOL 128 04/03/2020 0951   TRIG 342.0 (H) 12/30/2021 0944   HDL 25.00 (L) 12/30/2021 0944   HDL 40 04/03/2020 0951   CHOLHDL 6 12/30/2021 0944   VLDL 68.4 (H) 12/30/2021 0944   LDLCALC 58 04/03/2020 0951   LDLDIRECT 80.0 12/30/2021 0944    Physical Exam:    VS:  BP 100/62 (BP Location: Left Arm, Patient Position: Sitting)   Pulse 79   Ht 5\' 11"  (1.803 m)   Wt 203 lb 12.8 oz (92.4 kg)   SpO2 93%   BMI 28.42 kg/m     Wt Readings from Last 3 Encounters:  10/12/22 203 lb 12.8 oz (92.4 kg)  10/05/22 199 lb (90.3 kg)  09/18/22 199 lb 1.2 oz (90.3 kg)     GEN:  Well nourished, well developed in no acute distress HEENT: Normal NECK: No JVD; No carotid bruits LYMPHATICS: No lymphadenopathy CARDIAC: RRR, no murmurs, no rubs, no gallops RESPIRATORY:  Clear to auscultation without rales, wheezing or rhonchi  ABDOMEN: Soft, non-tender, non-distended MUSCULOSKELETAL:  No edema; No deformity  SKIN: Warm and dry LOWER EXTREMITIES: no swelling NEUROLOGIC:  Alert and oriented x 3 PSYCHIATRIC:  Normal affect   ASSESSMENT:    1. Paroxysmal atrial fibrillation (HCC)   2. Bifascicular block   3. Dyspnea on exertion   4. Preop cardiovascular exam    PLAN:    In order  of problems listed above:  Cardiovascular preop evaluation for this gentleman with multiple heart issues including cardiac catheterization with his last stenting done in 2020 last stress test was done in 2020.  His ability to exercise is very limited.  My understanding is also that the surgery is cardioversion since twice already he ran into trouble with his drain that required hospitalization and antibiotic therapy for sepsis.  I will wait for opinion of surgeon how urgent the surgery is.  Will repeat his echocardiogram to check on aortic valve status.  If surgeon thinks that surgery is needed necessary rather quickly with  understanding that from cardiac standpoint reviewed this is not optimal preparation but I think we can proceed.  Obviously would complicate the situation a lot is recent pulmonary emboli and ideally for at least 3 months we prefer not to interrupt anticoagulation.  Again I will wait for surgeons opinion about potential surgery.  He is also need to see pulmonologist.  In the meantime I will schedule him to have echocardiogram done. Paroxysmal atrial fibrillation he is anticoagulated now. Bifascicular block noted.  No dizziness no passing out. Chronic lung condition followed by pulmonary.   Medication Adjustments/Labs and Tests Ordered: Current medicines are reviewed at length with the patient today.  Concerns regarding medicines are outlined above.  Orders Placed This Encounter  Procedures   EKG 12-Lead   ECHOCARDIOGRAM COMPLETE   Medication changes: No orders of the defined types were placed in this encounter.   Signed, Georgeanna Lea, MD, [facility] 10/12/2022 11:03 AM    [facility]

## 2022-10-12 NOTE — Patient Instructions (Signed)
Medication Instructions:  Your physician recommends that you continue on your current medications as directed. Please refer to the Current Medication list given to you today.  *If you need a refill on your cardiac medications before your next appointment, please call your pharmacy*   Lab Work: None Ordered If you have labs (blood work) drawn today and your tests are completely normal, you will receive your results only by: [place] (if you have MyChart) OR A paper copy in the mail If you have any lab test that is abnormal or we need to change your treatment, we will call you to review the results.   Testing/Procedures: Your physician has requested that you have an echocardiogram. Echocardiography is a painless test that uses sound waves to create images of your heart. It provides your doctor with information about the size and shape of your heart and how well your heart's chambers and valves are working. This procedure takes approximately one hour. There are no restrictions for this procedure. Please do NOT wear cologne, perfume, aftershave, or lotions (deodorant is allowed). Please arrive 15 minutes prior to your appointment time.    Follow-Up: At [facility], you and your health needs are our priority.  As part of our continuing mission to provide you with exceptional heart care, we have created designated Provider Care Teams.  These Care Teams include your primary Cardiologist (physician) and Advanced Practice Providers (APPs -  Physician Assistants and Nurse Practitioners) who all work together to provide you with the care you need, when you need it.  We recommend signing up for the patient portal called "MyChart".  Sign up information is provided on this After Visit Summary.  MyChart is used to connect with patients for Virtual Visits (Telemedicine).  Patients are able to view lab/test results, encounter notes, upcoming appointments, etc.  Non-urgent messages can be sent to your  provider as well.   To learn more about what you can do with MyChart, go to [email or web address].    Your next appointment:   3 month(s)  The format for your next appointment:   In Person  Provider:   Jenne Campus, MD    Other Instructions NA

## 2022-10-13 ENCOUNTER — Telehealth: Payer: Self-pay

## 2022-10-13 ENCOUNTER — Ambulatory Visit: Payer: Medicare Other | Admitting: Primary Care

## 2022-10-13 ENCOUNTER — Encounter: Payer: Self-pay | Admitting: Primary Care

## 2022-10-13 ENCOUNTER — Encounter: Payer: Self-pay | Admitting: Cardiology

## 2022-10-13 VITALS — BP 108/62 | HR 82 | Temp 98.2°F | Ht 71.0 in | Wt 203.6 lb

## 2022-10-13 DIAGNOSIS — J84112 Idiopathic pulmonary fibrosis: Secondary | ICD-10-CM | POA: Diagnosis not present

## 2022-10-13 DIAGNOSIS — G4733 Obstructive sleep apnea (adult) (pediatric): Secondary | ICD-10-CM

## 2022-10-13 DIAGNOSIS — J9611 Chronic respiratory failure with hypoxia: Secondary | ICD-10-CM

## 2022-10-13 NOTE — Progress Notes (Signed)
@Patient  ID: Robert Lang, male    DOB: [birth year], 72 y.o.   MRN: [id number]  Chief Complaint  Patient presents with   Hospitalization Follow-up    Referring provider: SaguierKateri Mc  HPI: 72 year old male, former smoker. PMH significant for CAD, afib, HTN, COPD, allergic rhinitis, OSA, GERD. Patient of Dr. Marchelle Gearing.   Previous [facility] pulmonary encounter:  06/23/2021 -   Chief Complaint  Patient presents with   Follow-up    Follow up for IPH. Pt states he cant walk to far due to breathing. Pt did get full PFTs done this office visit.    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 The diagnosis based on the fact that you age over 74, previous smoking, you occupational exposures, negative serology, Caucasian ethnicity, male gender and probable UIP pattern on CT scan   Started esbriet - one week after easter 2022   Weight loss after starting Esbriet.  2 antihypertensives had to be stopped   OSA CPAP pending end of 2022   Mild gait unsteadiness due to spinal issues    Long-term turmeric use  Lung nodules - feb 2022:L Three scattered solid basilar left lower lobe pulmonary nodules, largest 7 mm posteromedially.    HPI Robert Lang 72 y.o. -'= returns for follow-up.  He presents with his wife.  Overall no change in shortness of breath.  He continues to tolerate pirfenidone well.  He tells me that for the last 3-4 months he has developed right upper quadrant pain.  Initially primary care physician thought this was because of acid reflux and gave him Carafate but this pain persist.  It is a dull pain that in the right upper quadrant it comes and goes.  Gets worse with eating.  It improves by not eating.  Overall course is 1 of improvement but it is still there.  It was severe but now it is mild-moderate.  He says GI has seen him and his gallbladder and pancreas are being cleared.  Upper endoscopy is pending.  Apparently the working diagnosis is that because of  neuropathy.  However he does indicate that ever since he has been taking his.  He has been taking ginger to prevent any nausea.  After this pain started maybe he has intermittent extra nausea for which he takes Zofran 2 times a week.  He does not think aspirate is the cause of this pain and the increased nausea.  His pulmonary function test shows stability in FVC but his DLCO is down 18%.?  We do not know if this is because of technical performance.  His last pulmonary function test was a year ago.  He did have some small nodules at that time.  His wife is interested in the support group.  I did indicate to them the support group is currently dormant but did give the email of the contact person there.  He is also interested in clinical trials.  I did indicate to him that we would give a consent for research study currently underway.  However would like to wait till there is clarity on the abdominal symptoms especially right upper quadrant.  He is okay with that.  07/14/2021 Patient presents today to review sleep study results and HRCT imaging. He is doing alright today. Accompanied by his wife. Dyspnea symptoms were noted to be similar to previous when he saw Dr. Marchelle Gearing earlier this month. PFTs were inconclusive for worsening disease, HRCT showed unchanged appear appearance ILD since 05/08/2020. Mild  centrilobular emphysema. He had an endocardiogram that showed possible vegetation, advised to follow up with cardiology- he last saw Dr. Bing Matter in December 2022. He is currently taking Esbriet 801mg  three times a day with food. Stomach is better. He gets out of breath with simple tasks, he can no longer do house work or shopping. He uses an Art gallery manager which has been helpful. He has a rare cough, mostly just to clear his throat. Nausea is tolerable, no diarrhea. He is taking medication for anxiety and depression. He is not particularly active, he has been to pulmonary rehab but did not keep up with  exercises. He is not particularly interested in returning but will consider restarting an exercise regimen and if needed will return to pul, rehab. He is not interested in any research opportunities. His wife states that they will reach out to contact for patient support group. He is consistent with CPAP use, no issues with pressure setting or mask fit.   Airview download 06/14/21-07/13/21 Usage 30/30 days used; 100% > 4 hours Average usage 6 hours 51 mins Pressure 5-20cm h20 (14.2cm h20-95%) Airleaks 22.6L/min AHI 1.3  09/16/2022 -   Chief Complaint  Patient presents with   Hospitalization Follow-up    Blood clot in lung, not feeling good, Gallbladder pain.    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  2 antihypertensives had to be stopped -Stable high-resolution CT chest April 2023 compared to February 2022  High risk prescription of pirfenidone  - Esbriet/Pirfenidone requires intensive drug monitoring due to high concerns for Adverse effects of , including  Drug Induced Liver Injury, significant GI side effects that include but not limited to Diarrhea, Nausea, Vomiting,  and other system side effects that include Fatigue, headaches, weight loss and other side effects such as skin rash. These will be monitored with  blood work such as LFT initially once a month for 6 months and then quarterly    Mild associated emphysema  -Noticed in April 2023 high-resolution CT chest and started on Stiolto  Atrial flutter status post ablation 2017  -On Xarelto  Grade 1 diastolic dysfunction  -Echocardiogram Sept 2023 (no PASP)   Coronary artery disease  -On GDMT treatment  OSA CPAP pending end of 2022   Mild gait unsteadiness due to spinal issues      - uses cane  Long-term turmeric use  Lung nodules  - feb 2022:L Three scattered solid basilar left lower lobe pulmonary nodules, largest 7 mm posteromedially.   -CT chest April 2023 describes this as 4 mm subpleural nodules.   History of COVID-19 Memorial Day weekend 2023 Admission early June 2023 with a syncope  -Antviral Paxlovid considered as an etiology and dehydrtion  -MRI negative, EEG unremarkable  EKG with bifascicular block  -Cleared by neurology September 15, 2021 outpatient  -ZIO monitor placed June 2023 wounds.\  - QtcF 04/20/2022  Prolonged Qtc >  - QTcF - 04/20/2022  - QTc 478 msec on EKG June 2024 personally visualzied and itnerpreted   Admission 08/28/2022 - 09/01/2022  -Small subsegmental pulmonary embolism treatment with anticoagulation  -Acute cholecystitis status post cholecystostomy  Depressive disorder, generalized anxiety disorder and insomnia  -Under the care of Avelina Laine nurse practitioner psychiatry  -On Zoloft,  trazodone, BuSpar, Lamictal, lorazepam, Wellbutrin  HPI Robert Lang 72 y.o. -returns for post hospital follow-up.  This is an unscheduled visit otherwise.  His pulm fibrosis itself is stable.  He presents with his wife.  Wife is an independent historian.  History is also gained from review of the medical records especially external medical record related to hospitalization 08/28/2022 - 09/01/2022.  He had dual problems of subsegmental pulmonary embolism and also acute cholecystitis.  Discharged on Eliquis.  Also status post gallbladder drain.  He was on oxygen but he is now off oxygen.  He is getting home physical therapy.  He is upset about his illness but he feels he is gaining strength.  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  He is tolerating high-dose prescription of pirfenidone that requires intensive therapeutic monitoring well.  He had blood work  09/13/2022.  Liver function test was normal.  Hemoglobin and kidney function are normal.  Also visualized the CT scan of the chest from August 29, 2022: Agree with the pulmonary embolism and I also feel that the pulmonary fibrosis itself is stable.  With my personal independent of interpretation.    Other issues - She did see psychiatry on 09/14/2022.  His BuSpar was increased.  Other medications noted above but continued.  -                SYMPTOM SCALE - ILD 06/17/2020   07/28/2020   09/09/2020 184# 11/18/2020 185#  Esbriet 801mg  BID 12/19/2020   Esbriet 801mg  TID  02/19/2021 187# 06/23/2021 180# 07/14/2021 Esbriet 801mg  TID  09/24/2021 188# - esbreit and also stiolto 01/07/2022 206# 04/20/2022 200# esbriet  O2 use ra RA     RA   ra0 RA  ra ra ra  Shortness of Breath 0 -> 5 scale with 5 being worst (score 6 If unable to do) 0-5             At rest 3 0.5 2 4 3   0 3 2 3 3   Simple tasks - showers, clothes change, eating, shaving 5 3 3 3 2  3 3 4 4 2   Household (dishes, doing bed, laundry) 5 3 (he does not do these tasks) 5 0 3 (he does not do these tasks)  0 NA 4 5 2   Shopping 5 3 (he does not do this often) 3 1 3  (he does not do these tasks)  4 3.5 (uses scooter) 4 5 2   Walking level at own pace 4 3 3 1 3  5 3 3 3 2   Walking up Stairs 5 5 4 1 3  5  3.5 4 5 2   Total (30-36) Dyspnea Score 27 17.5 20 10 17  17 16 21 25 13   How bad is your cough? 3 2 1  fair 1  0 1 2 2 2   How bad is your fatigue 4 2 4  fair 3  3 4 3 5 2   How bad is nausea 0 0 3 good 2  3 2 2 2  0  How bad is vomiting?   0 0 0 good 0  0 0 0 0 0  How bad is diarrhea? 3 0 0 good 0  0 0 1 2 0  How bad is anxiety? 5 5 3  fair 2  5 5 3 5 3   How bad is depression 5 5 3  faor 3  3 4 3 4  3  Simple office walk 185 feet x  3 laps goal with forehead probe 06/23/2021  09/24/2021  01/07/2022   O2 used ra ra ra  Number laps completed 2 of 3 Did all 3 withe cane Did all 3 with cane  Comments about pace Not done Slow pace with cane Slow  pace - stopped 2-3 times in fist lap  Resting Pulse Ox/HR 98% and 97/min 99% and HR 102 99% and HR 86  Final Pulse Ox/HR 97% and 101/min 99% and HR 112 98% and HR 100  Desaturated </= 88% no no no  Desaturated <= 3% points no no no  Got Tachycardic >/= 90/min yes Ye even at rest yes  Symptoms at end of test Not rcorded Modrate dyspnea Moderate to severe dyspnea  Miscellaneous comments x        10/13/2022- Interim hx  Patient presents today for hospital follow-up.  He was admitted from 10/05/2022 - 10/09/2022 for sepsis secondary to Klebsiella bacteremia.  Originally presented with abdominal pain and decreased cholecystostomy tube output.  He was treated with IV Rocephin and Flagyl, transition to oral Levaquin on 10/08/2022.  Prior to this admission he had been treated for cholecystitis for close to 5 weeks, underwent cholecystostomy drain placement first week of June.  Drain repositioned by IR on 10/06/2022.  Patient was cleared by IR and general surgery for discharge.  Will eventually need Cholecystectomy with Dr. Feliciana Rossetti with [facility] general surgery after clearance with his primary cardiologist and pulmonologist. Tentative date is mid September.   Patient follows with Dr. Marchelle Gearing for history of IPF and COPD. Maintained on LABA/LAMA, pirfenidone and CPAP at bedtime. He was seen by Dr. Gwenyth Bender in June and IPF felt to be stable since December 2022 based on PFTs and symptoms. Tolerating Esbriet. Patient reports that his O2 was fluctuating prior to hospitalization. He was discharged on 2L which has been approved through 2027. He has been wearing oxygen at night but not CPAP. He needs adaptor to connect oxygen to CPAP. Spirometry is scheduled for Sept 5th. He is on Eliquis for history of paroxysmal A-fib and history of PE.   Airview download 09/12/2022 - 10/11/2022 Usage days 20/30 days (67%); 11 days (37%) greater than 4 hours Average usage days used 4 hours 19 minutes Pressure 5 to 20 cm  H2O (9.4 cm H2O 95%) Air leaks 18.8 L/min (95%) AHI 1.0                SYMPTOM SCALE - ILD 06/17/2020   07/28/2020   09/09/2020 184# 11/18/2020 185#  Esbriet 801mg  BID 12/19/2020   Esbriet 801mg  TID  02/19/2021 187# 06/23/2021 180# 07/14/2021 Esbriet 801mg  TID  09/24/2021 188# - esbreit and also stiolto 01/07/2022 206# 04/20/2022 200# esbriet 10/13/2022  Esbriet 801mg  TID   O2 use ra RA     RA   ra0 RA  ra ra ra 2L   Shortness of Breath 0 -> 5 scale with 5 being worst (score 6 If unable to do) 0-5              At rest 3 0.5 2 4 3   0 3 2 3 3 2   Simple tasks - showers, clothes change, eating, shaving 5 3 3 3 2  3 3 4 4 2 1   Household (dishes, doing bed, laundry) 5 3 (he does not do these tasks) 5 0 3 (he does not do these tasks)  0 NA 4 5 2  NA  Shopping 5 3 (he does  not do this often) 3 1 3  (he does not do these tasks)  4 3.5 (uses scooter) 4 5 2  2.5 (scooter)  Walking level at own pace 4 3 3 1 3  5 3 3 3 2 2   Walking up Stairs 5 5 4 1 3  5  3.5 4 5 2  NA  Total (30-36) Dyspnea Score 27 17.5 20 10 17  17 16 21 25 13  7.5  How bad is your cough? 3 2 1  fair 1  0 1 2 2 2 3   How bad is your fatigue 4 2 4  fair 3  3 4 3 5 2 3   How bad is nausea 0 0 3 good 2  3 2 2 2  0 3  How bad is vomiting?   0 0 0 good 0  0 0 0 0 0 0  How bad is diarrhea? 3 0 0 good 0  0 0 1 2 0 0  How bad is anxiety? 5 5 3  fair 2  5 5 3 5 3 5   How bad is depression 5 5 3  faor 3  3 4 3 4 3 5     Simple office walk 224 (66+46 x 2) feet Pod A at [facility] x  3 laps goal with forehead probe 09/16/2022  10/13/2022   O2 used ra 2L   Number laps completed Sit stand x 5 with cane Sit stand x 10   Comments about pace stead Slow   Resting Pulse Ox/HR 93% and 102/min 97% and 78/min  Final Pulse Ox/HR 92% and 109/min 95% and 94/min  Desaturated </= 88% no no  Desaturated <= 3% points no no  Got Tachycardic >/= 90/min yes yes  Symptoms at end of test x xx  Miscellaneous comments x xx     Allergies  Allergen Reactions   Naproxen  Other (See Comments)    (Naprosyn *ANALGESICS - ANTI-INFLAMMATORY*) Nausea, Abdominal pain   Silodosin Other (See Comments)    Low blood pressure    Immunization History  Administered Date(s) Administered   Influenza Split 01/09/2010, 02/09/2011   Influenza, High Dose Seasonal PF 11/15/2018, 01/11/2020, 12/27/2020, 12/24/2021   Influenza,inj,Quad PF,6+ Mos 01/01/2015   Influenza-Unspecified 01/30/2014, 02/02/2016, 12/15/2020   PFIZER(Purple Top)SARS-COV-2 Vaccination 04/27/2019, 05/18/2019, 01/14/2020, 07/22/2020, 12/15/2020, 12/24/2021   Pneumococcal Conjugate-13 11/23/2018   Pneumococcal Polysaccharide-23 08/12/2020   Respiratory Syncytial Virus Vaccine,Recomb Aduvanted(Arexvy) 12/17/2021   Tdap 02/18/2011    Past Medical History:  Diagnosis Date   Acute ST elevation myocardial infarction (STEMI) of inferolateral wall (HCC) 01/10/2019   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 M75.00)   Allergic rhinitis due to pollen 09/03/2013   J30.1)  Formatting of this note might be different from the original. J30.1)   Allergy    Anticoagulated 07/01/2016   Anxiety    Arthritis    Atrial fibrillation (HCC)    Atrial flutter (HCC) 06/26/2015   CAD (coronary artery disease)    Chest pain 06/26/2015   COPD (chronic obstructive pulmonary disease) (HCC)    Coronary artery disease 07/06/2018   Cardiac catheterization 2017 showing 90% small diagonal branch disease   DDD (degenerative disc disease), cervical 09/03/2013   M50.90)  Formatting of this note might be different from the original. M50.90)   Depression    Depression    Depressive disorder 09/03/2013   Dizziness 10/28/2017   Dyslipidemia, goal LDL below 70 07/06/2018   Dyspnea on exertion  10/20/2018   Esophageal reflux 09/03/2013   Essential hypertension 07/06/2018   ETOH abuse 01/11/2019   6 pack of beer per day   Falls 10/28/2017   GERD  (gastroesophageal reflux disease)    H/O amiodarone therapy 07/01/2016   Heart attack (HCC)    Heart palpitations 01/27/2017   History of colon polyps    Hyperlipidemia    Hypertension    IPF (idiopathic pulmonary fibrosis) (HCC)    Kidney stones    Neck pain 09/03/2013   Neuropathy 07/06/2018   Obstructive sleep apnea 08/05/2015   PLMD (periodic limb movement disorder) 11/04/2015   Polycythemia, 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 STORY: Due to alcohol/ tobacco   Pure hypercholesterolemia 09/03/2013   E78.0)  Formatting of this note might be different from the original. E78.0)   Screening for prostate cancer 09/03/2013   Smoker 01/11/2019   Smoking 07/06/2018   Status post ablation of atrial flutter 07/06/2018   2017    Tobacco History: Social History   Tobacco Use  Smoking Status Former   Current packs/day: 0.00   Average packs/day: 1 pack/day for 30.0 years (30.0 ttl pk-yrs)   Types: Cigarettes   Start date: 01/09/1989   Quit date: 01/10/2019   Years since quitting: 3.7  Smokeless Tobacco Former   Counseling given: Not Answered   Outpatient Medications Prior to Visit  Medication Sig Dispense Refill   acetaminophen (TYLENOL) 500 MG tablet Take 1 tablet (500 mg total) by mouth every 8 (eight) hours as needed for moderate pain. 100 tablet 0   apixaban (ELIQUIS) 5 MG TABS tablet Take 1 tablet (5 mg total) by mouth 2 (two) times daily. 60 tablet 0   atorvastatin (LIPITOR) 80 MG tablet Take 1 tablet (80 mg total) by mouth daily. (Patient taking differently: Take 80 mg by mouth every evening.) 90 tablet 2   buPROPion (WELLBUTRIN XL) 300 MG 24 hr tablet Take 1 tablet (300 mg total) by mouth daily. 90 tablet 1   busPIRone (BUSPAR) 15 MG tablet Take 1 tablet (15 mg total) by mouth 3 (three) times daily. 90 tablet 3   Cholecalciferol (VITAMIN D3) 25 MCG (1000 UT) CAPS Take 2 capsules by mouth 2  (two) times daily.     docusate sodium (COLACE) 100 MG capsule Take 100 mg by mouth daily.     famotidine (PEPCID) 40 MG tablet TAKE 1 TABLET(40 MG) BY MOUTH AT BEDTIME (Patient taking differently: Take 40 mg by mouth at bedtime.) 90 tablet 2   fexofenadine (ALLEGRA) 180 MG tablet Take 180 mg by mouth at bedtime.      finasteride (PROSCAR) 5 MG tablet Take 1 tablet (5 mg total) by mouth daily. 90 tablet 1   guaiFENesin (MUCINEX) 600 MG 12 hr tablet Take 2 tablets (1,200 mg total) by mouth 2 (two) times daily. 120 tablet 0   lamoTRIgine (LAMICTAL) 100 MG tablet Take 1 tablet (100 mg total) by mouth daily. (Patient taking differently: Take 100 mg by mouth every evening.) 30 tablet 1   levalbuterol (XOPENEX) 1.25 MG/0.5ML nebulizer solution Take 1.25 mg by nebulization every 4 (four) hours as needed for wheezing or shortness of breath. 1 each 12   LORazepam (ATIVAN) 0.5 MG tablet Take one tablet by mouth only for severe anxiety or agitation (Patient taking differently: Take 0.5 mg by mouth daily as needed for anxiety. Take one tablet by mouth only for severe anxiety or  agitation. Has not taking medication yet.) 30 tablet 1   Melatonin 10 MG TABS Take 1 tablet by mouth at bedtime.     Multiple Vitamins-Minerals (PRESERVISION AREDS PO) Take 1 capsule by mouth in the morning and at bedtime.     nitroGLYCERIN (NITROSTAT) 0.4 MG SL tablet Place 1 tablet (0.4 mg total) under the tongue every 5 (five) minutes as needed for chest pain. 25 tablet 3   ondansetron (ZOFRAN) 4 MG tablet Take 1 tablet (4 mg total) by mouth every 8 (eight) hours as needed for nausea or vomiting. 20 tablet 0   oxyCODONE (OXY IR/ROXICODONE) 5 MG immediate release tablet Take 1 tablet (5 mg total) by mouth every 12 (twelve) hours as needed for severe pain. 6 tablet 0   pantoprazole (PROTONIX) 40 MG tablet TAKE 1 TABLET(40 MG) BY MOUTH DAILY (Patient taking differently: Take 40 mg by mouth daily.) 90 tablet 1   Pirfenidone (ESBRIET) 801 MG  TABS Take 1 tablet (801 mg total) by mouth 3 (three) times daily. 270 tablet 1   Polyethylene Glycol 3350 (MIRALAX PO) Take 17 g by mouth daily.     pregabalin (LYRICA) 150 MG capsule Take 1 capsule (150 mg total) by mouth 2 (two) times daily. 90 capsule 5   ranolazine (RANEXA) 1000 MG SR tablet Take 1 tablet (1,000 mg total) by mouth 2 (two) times daily. 180 tablet 1   sertraline (ZOLOFT) 100 MG tablet Take 1.5 tablets (150 mg total) by mouth at bedtime. 135 tablet 1   Sodium Chloride Flush (NORMAL SALINE FLUSH) 0.9 % SOLN Flush drain with 5 mLs daily. Discard remaining 5ml in each syringe. 200 mL 0   traZODone (DESYREL) 50 MG tablet TAKE 1 TABLET(50 MG) BY MOUTH AT BEDTIME AS NEEDED FOR SLEEP. MAY TAKE 1/2 TABLET TO 1 TABLET FOR SLEEP, AS NEEDED (Patient taking differently: Take 50 mg by mouth at bedtime as needed for sleep.) 30 tablet 3   umeclidinium-vilanterol (ANORO ELLIPTA) 62.5-25 MCG/ACT AEPB Inhale 1 puff into the lungs daily. 60 each 0   vitamin B-12 (CYANOCOBALAMIN) 1000 MCG tablet Take 1,000 mcg by mouth daily.     zolpidem (AMBIEN) 5 MG tablet Take 1 tablet (5 mg total) by mouth at bedtime as needed for sleep. 30 tablet 5   levofloxacin (LEVAQUIN) 750 MG tablet Take 1 tablet (750 mg total) by mouth daily. (Patient not taking: Reported on 10/13/2022) 3 tablet 0   No facility-administered medications prior to visit.   Review of Systems  Review of Systems  Constitutional:  Negative for fever.  HENT: Negative.    Respiratory:  Positive for cough and shortness of breath. Negative for chest tightness and wheezing.      Physical Exam  BP 108/62 (BP Location: Left Arm, Patient Position: Sitting, Cuff Size: Normal)   Pulse 82   Temp 98.2 F (36.8 C) (Oral)   Ht 5\' 11"  (1.803 m)   Wt 203 lb 9.6 oz (92.4 kg)   SpO2 98%   BMI 28.40 kg/m  Physical Exam Constitutional:      General: He is not in acute distress.    Appearance: Normal appearance. He is not ill-appearing.  HENT:      Head: Normocephalic and atraumatic.  Cardiovascular:     Rate and Rhythm: Normal rate and regular rhythm.  Pulmonary:     Effort: Pulmonary effort is normal.     Breath sounds: Normal breath sounds.  Musculoskeletal:        General: Normal range  of motion.  Skin:    General: Skin is warm and dry.  Neurological:     General: No focal deficit present.     Mental Status: He is alert and oriented to person, place, and time. Mental status is at baseline.  Psychiatric:        Mood and Affect: Mood normal.        Behavior: Behavior normal.        Thought Content: Thought content normal.        Judgment: Judgment normal.      Lab Results:  CBC    Component Value Date/Time   WBC 7.5 10/09/2022 0332   RBC 3.82 (L) 10/09/2022 0332   HGB 11.5 (L) 10/09/2022 0332   HGB 15.9 07/28/2021 1033   HCT 35.8 (L) 10/09/2022 0332   HCT 47.4 07/28/2021 1033   PLT 173 10/09/2022 0332   PLT 243 07/28/2021 1033   MCV 93.7 10/09/2022 0332   MCV 98 (H) 07/28/2021 1033   MCH 30.1 10/09/2022 0332   MCHC 32.1 10/09/2022 0332   RDW 14.3 10/09/2022 0332   RDW 12.7 07/28/2021 1033   LYMPHSABS 1.1 10/09/2022 0332   LYMPHSABS 1.5 07/28/2021 1033   MONOABS 1.0 10/09/2022 0332   EOSABS 0.0 10/09/2022 0332   EOSABS 0.0 07/28/2021 1033   BASOSABS 0.0 10/09/2022 0332   BASOSABS 0.0 07/28/2021 1033    BMET    Component Value Date/Time   NA 138 10/09/2022 0332   NA 137 03/05/2020 1343   K 4.0 10/09/2022 0332   CL 101 10/09/2022 0332   CO2 27 10/09/2022 0332   GLUCOSE 106 (H) 10/09/2022 0332   BUN <5 (L) 10/09/2022 0332   BUN 14 03/05/2020 1343   CREATININE 0.73 10/09/2022 0332   CREATININE 0.92 09/02/2021 1541   CALCIUM 8.5 (L) 10/09/2022 0332   GFRNONAA >60 10/09/2022 0332   GFRAA 81 03/05/2020 1343    BNP    Component Value Date/Time   BNP 330.7 (H) 10/09/2022 0332   BNP 37 09/02/2021 1541    ProBNP    Component Value Date/Time   PROBNP 109 03/05/2020 1343    Imaging: VAS [place]  LOWER EXTREMITY VENOUS (DVT)  Result Date: 10/08/2022  Lower Venous DVT Study Patient Name:  Robert Lang  Date of Exam:   10/08/2022 Medical Rec #: [id number]          Accession #:    [id number] Date of Birth: 1950-10-27           Patient Gender: M Patient Age:   57 years Exam Location:  [facility] Procedure:      VAS [place] LOWER EXTREMITY VENOUS (DVT) Referring Phys: [facility] --------------------------------------------------------------------------------  Indications: Swelling.  Risk Factors: None identified. Anticoagulation: Eliquis. Limitations: Poor ultrasound/tissue interface. Comparison Study: 09/17/2022 - Negative for DVT. Performing Technologist: Chanda Busing RVT  Examination Guidelines: A complete evaluation includes B-mode imaging, spectral Doppler, color Doppler, and power Doppler as needed of all accessible portions of each vessel. Bilateral testing is considered an integral part of a complete examination. Limited examinations for reoccurring indications may be performed as noted. The reflux portion of the exam is performed with the patient in reverse Trendelenburg.  +---------+---------------+---------+-----------+----------+--------------+ RIGHT    CompressibilityPhasicitySpontaneityPropertiesThrombus Aging +---------+---------------+---------+-----------+----------+--------------+ CFV      Full           Yes      Yes                                 +---------+---------------+---------+-----------+----------+--------------+  SFJ      Full                                                        +---------+---------------+---------+-----------+----------+--------------+ FV Prox  Full                                                        +---------+---------------+---------+-----------+----------+--------------+ FV Mid   Full                                                         +---------+---------------+---------+-----------+----------+--------------+ FV DistalFull                                                        +---------+---------------+---------+-----------+----------+--------------+ PFV      Full                                                        +---------+---------------+---------+-----------+----------+--------------+ POP      Full           Yes      Yes                                 +---------+---------------+---------+-----------+----------+--------------+ PTV      Full                                                        +---------+---------------+---------+-----------+----------+--------------+ PERO     Full                                                        +---------+---------------+---------+-----------+----------+--------------+   +---------+---------------+---------+-----------+----------+--------------+ LEFT     CompressibilityPhasicitySpontaneityPropertiesThrombus Aging +---------+---------------+---------+-----------+----------+--------------+ CFV      Full           Yes      Yes                                 +---------+---------------+---------+-----------+----------+--------------+ SFJ      Full                                                        +---------+---------------+---------+-----------+----------+--------------+  FV Prox  Full                                                        +---------+---------------+---------+-----------+----------+--------------+ FV Mid   Full                                                        +---------+---------------+---------+-----------+----------+--------------+ FV DistalFull                                                        +---------+---------------+---------+-----------+----------+--------------+ PFV      Full                                                         +---------+---------------+---------+-----------+----------+--------------+ POP      Full           Yes      Yes                                 +---------+---------------+---------+-----------+----------+--------------+ PTV      Full                                                        +---------+---------------+---------+-----------+----------+--------------+ PERO     Full                                                        +---------+---------------+---------+-----------+----------+--------------+     Summary: RIGHT: - There is no evidence of deep vein thrombosis in the lower extremity.  - No cystic structure found in the popliteal fossa.  LEFT: - There is no evidence of deep vein thrombosis in the lower extremity.  - No cystic structure found in the popliteal fossa.  *See table(s) above for measurements and observations. Electronically signed by Sherald Hess MD on 10/08/2022 at 2:12:39 PM.    Final    IR EXCHANGE BILIARY DRAIN  Result Date: 10/06/2022 INDICATION: Rigors, right abdominal pain, and decreased output from cholecystostomy tube EXAM: Exchange of cholecystostomy drainage catheter using fluoroscopic guidance MEDICATIONS: Documented in the EMR ANESTHESIA/SEDATION: Moderate (conscious) sedation was employed during this procedure. A total of Versed 1 mg and Fentanyl 100 mcg was administered intravenously. Moderate Sedation Time: 10 minutes. The patient's level of consciousness and vital signs were monitored continuously by radiology nursing throughout the procedure under my direct supervision. FLUOROSCOPY TIME:  Fluoroscopy Time: 0.5 minutes (3 mGy) COMPLICATIONS: None immediate. PROCEDURE: Informed  written consent was obtained from the patient after a thorough discussion of the procedural risks, benefits and alternatives. All questions were addressed. Maximal Sterile Barrier Technique was utilized including caps, mask, sterile gowns, sterile gloves, sterile drape, hand  hygiene and skin antiseptic. A timeout was performed prior to the initiation of the procedure. Patient was placed supine on the exam table. The right upper quadrant was prepped and draped in the standard sterile fashion with inclusion of the existing cholecystostomy drainage catheter within the sterile field. Injection of the existing cholecystostomy drainage catheter demonstrated appropriate location within the gallbladder lumen. Locking loop was released, and over an Amplatz wire, the existing cholecystostomy drainage catheter was exchanged for a new 12 French locking drainage catheter. Locking loop was formed, and location was confirmed with injection of contrast material opacifying the gallbladder lumen. Given concern for infection at this time, a dedicated cholangiogram and attempt to opacify the cystic duct was not attempted. The drainage catheter was attached to bag drainage. It was secured to the skin using silk suture and a dressing. The patient tolerated the procedure well without immediate complication. IMPRESSION: 1. Successful exchange and upsize of cholecystostomy drainage catheter for a new 12 French cholecystostomy tube. Cholecystostomy tube attached to bag drainage. 2. Recommend continued flushing 2-3 times daily. 3. Additional follow-up per surgery for cholecystectomy candidacy. Electronically Signed   By: Olive Bass M.D.   On: 10/06/2022 12:31   CT ABDOMEN PELVIS W CONTRAST  Result Date: 10/05/2022 CLINICAL DATA:  Abdominal pain. EXAM: CT ABDOMEN AND PELVIS WITH CONTRAST TECHNIQUE: Multidetector CT imaging of the abdomen and pelvis was performed using the standard protocol following bolus administration of intravenous contrast. RADIATION DOSE REDUCTION: This exam was performed according to the departmental dose-optimization program which includes automated exposure control, adjustment of the mA and/or kV according to patient size and/or use of iterative reconstruction technique. CONTRAST:   OMNIPAQUE IOHEXOL 300 MG/ML  SOLN COMPARISON:  CT abdomen pelvis dated September 16, 2022. FINDINGS: Lower chest: No acute abnormality.  Bibasilar atelectasis. Hepatobiliary: Unchanged small hemangioma in the right liver. No new focal liver abnormality. Percutaneous cholecystostomy tube remains in place. Slightly increased distention of the gallbladder compared to prior study. Unchanged mild pericholecystic inflammatory change. No biliary dilatation. Pancreas: Unremarkable. No pancreatic ductal dilatation or surrounding inflammatory changes. Spleen: Normal in size without focal abnormality. Adrenals/Urinary Tract: Adrenal glands are unremarkable. Kidneys are normal, without renal calculi, focal lesion, or hydronephrosis. Bladder is unremarkable. Stomach/Bowel: Stomach is within normal limits. Prior appendectomy. No evidence of bowel wall thickening, distention, or inflammatory changes. Left-sided colonic diverticulosis. Vascular/Lymphatic: Aortic atherosclerosis. No enlarged abdominal or pelvic lymph nodes. Reproductive: Prostate is unremarkable. Other: No abdominal wall hernia or abnormality. No abdominopelvic ascites. No pneumoperitoneum. Musculoskeletal: No acute or significant osseous findings. IMPRESSION: 1. Percutaneous cholecystostomy tube remains in place. Slightly increased distention of the gallbladder compared to prior study with similar degree of mild pericholecystic inflammatory change. Correlate with tube function. 2.  Aortic Atherosclerosis (ICD10-I70.0). Electronically Signed   By: Obie Dredge M.D.   On: 10/05/2022 17:55   DG Chest Portable 1 View  Result Date: 10/05/2022 CLINICAL DATA:  Provided history: Infection. EXAM: PORTABLE CHEST 1 VIEW COMPARISON:  Chest CT 09/16/2022. Prior chest radiographs 09/16/2022 and earlier. FINDINGS: The cardiomediastinal silhouette is unchanged. Aortic atherosclerosis. Linear opacities within the right lung base compatible with atelectasis and/or scarring.  No appreciable airspace consolidation. No evidence of pleural effusion or pneumothorax. No acute osseous abnormality identified. IMPRESSION: 1. No evidence of  airspace consolidation. 2. Atelectasis and/or scarring within the right lung base. 3. Aortic Atherosclerosis (ICD10-I70.0). Electronically Signed   By: Jackey Loge D.O.   On: 10/05/2022 16:15   VAS [place] LOWER EXTREMITY VENOUS (DVT)  Result Date: 09/17/2022  Lower Venous DVT Study Patient Name:  Robert Lang  Date of Exam:   09/17/2022 Medical Rec #: [id number]          Accession #:    [id number] Date of Birth: [birth year]           Patient Gender: M Patient Age:   21 years Exam Location:  [facility] Procedure:      VAS [place] LOWER EXTREMITY VENOUS (DVT) Referring Phys: Jon Billings REGALADO --------------------------------------------------------------------------------  Indications: Edema.  Risk Factors: None identified. Comparison Study: 09/01/2022 - BILATERAL:                   - No evidence of deep vein thrombosis seen in the lower                   extremities, bilaterally.                   -No evidence of popliteal cyst, bilaterally. Performing Technologist: Chanda Busing RVT  Examination Guidelines: A complete evaluation includes B-mode imaging, spectral Doppler, color Doppler, and power Doppler as needed of all accessible portions of each vessel. Bilateral testing is considered an integral part of a complete examination. Limited examinations for reoccurring indications may be performed as noted. The reflux portion of the exam is performed with the patient in reverse Trendelenburg.  +---------+---------------+---------+-----------+----------+--------------+ RIGHT    CompressibilityPhasicitySpontaneityPropertiesThrombus Aging +---------+---------------+---------+-----------+----------+--------------+ CFV      Full           Yes      Yes                                  +---------+---------------+---------+-----------+----------+--------------+ SFJ      Full                                                        +---------+---------------+---------+-----------+----------+--------------+ FV Prox  Full                                                        +---------+---------------+---------+-----------+----------+--------------+ FV Mid   Full                                                        +---------+---------------+---------+-----------+----------+--------------+ FV DistalFull                                                        +---------+---------------+---------+-----------+----------+--------------+ PFV      Full                                                        +---------+---------------+---------+-----------+----------+--------------+  POP      Full           Yes      Yes                                 +---------+---------------+---------+-----------+----------+--------------+ PTV      Full                                                        +---------+---------------+---------+-----------+----------+--------------+ PERO     Full                                                        +---------+---------------+---------+-----------+----------+--------------+   +---------+---------------+---------+-----------+----------+--------------+ LEFT     CompressibilityPhasicitySpontaneityPropertiesThrombus Aging +---------+---------------+---------+-----------+----------+--------------+ CFV      Full           Yes      Yes                                 +---------+---------------+---------+-----------+----------+--------------+ SFJ      Full                                                        +---------+---------------+---------+-----------+----------+--------------+ FV Prox  Full                                                         +---------+---------------+---------+-----------+----------+--------------+ FV Mid   Full                                                        +---------+---------------+---------+-----------+----------+--------------+ FV DistalFull                                                        +---------+---------------+---------+-----------+----------+--------------+ PFV      Full                                                        +---------+---------------+---------+-----------+----------+--------------+ POP      Full           Yes      Yes                                 +---------+---------------+---------+-----------+----------+--------------+  PTV      Full                                                        +---------+---------------+---------+-----------+----------+--------------+ PERO     Full                                                        +---------+---------------+---------+-----------+----------+--------------+     Summary: RIGHT: - There is no evidence of deep vein thrombosis in the lower extremity.  - No cystic structure found in the popliteal fossa.  LEFT: - There is no evidence of deep vein thrombosis in the lower extremity.  - No cystic structure found in the popliteal fossa.  *See table(s) above for measurements and observations. Electronically signed by Coral Else MD on 09/17/2022 at 10:47:26 PM.    Final    CT Angio Chest PE W and/or Wo Contrast  Addendum Date: 09/17/2022   ADDENDUM REPORT: 09/17/2022 11:11 ADDENDUM: I was asked to review case at 9:10 am on 09/17/2022 with Dr. Sunnie Nielsen. Reported pulmonary embolus of the lingula likely due to streak or motion artifact. Additionally, previously performed chest CTA dated August 29, 2022 also significantly limited due to motion artifact and previously reported pulmonary embolus is not clearly appreciated, at the very least no embolus in any segmental or more proximal vessel. Lower extremity  ultrasound or repeat chest CTA could be performed for further evaluation. Electronically Signed   By: Allegra Lai M.D.   On: 09/17/2022 11:11   Result Date: 09/17/2022 CLINICAL DATA:  Pulmonary embolism suspected. High probability. New onset hypoxia and shortness of breath. Also severe abdominal pain. Recently discharged after treatment for small subsegmental arterial emboli in the left lower lobe. EXAM: CT ANGIOGRAPHY CHEST WITH CONTRAST TECHNIQUE: Multidetector CT imaging of the chest was performed using the standard protocol during bolus administration of intravenous contrast. Multiplanar CT image reconstructions and MIPs were obtained to evaluate the vascular anatomy. RADIATION DOSE REDUCTION: This exam was performed according to the departmental dose-optimization program which includes automated exposure control, adjustment of the mA and/or kV according to patient size and/or use of iterative reconstruction technique. CONTRAST:  OMNIPAQUE IOHEXOL 350 MG/ML SOLN COMPARISON:  Portable chest today, CTA chest 08/29/2022, CTA chest 08/25/2021 FINDINGS: Cardiovascular: Previously the patient had scattered subsegmental left lower lobe arterial filling defects. These are no longer seen. There is a small-volume embolic filling defect within a single lingular subsegmental artery on series 2 axial 43 and 44 and also visible on series 5 images 63-65. No further emboli are detected. There is panchamber cardiomegaly with a slight right chamber predominance which was seen previously but no IVC reflux to suspect acute right heart strain. There is no pericardial effusion. There are heavy calcifications in the left main and LAD coronary arteries, additional calcifications and stenting in the right coronary artery and small amount of calcification in the proximal circumflex artery. Lipomatous hypertrophy of the inter-atrial septum is chronically noted as well. The pulmonary veins are nondistended. The aorta and great  vessels are tortuous but normal caliber. No dissection is seen. There is moderate patchy aortic calcific plaque, less heavy scattered plaque  in the great vessels. Mediastinum/Nodes: No enlarged mediastinal, hilar, or axillary lymph nodes. Thyroid gland, trachea, and esophagus demonstrate no significant findings. Lungs/Pleura: Bilateral trace pleural effusions are similar to the last CT. No pneumothorax. There are opacities in both posterior basal lower lobes, noted to varying degrees on both prior studies, bilaterally increased today and could indicate increased atelectasis or developing pneumonia. There is a chronically elevated right hemidiaphragm and mild posterior atelectasis in the upper lobes. The lungs are otherwise clear. No nodules are seen. The main bronchi are patent. There is chronic bronchial thickening in the lower lobes. Upper Abdomen: A percutaneous cholecystostomy drain is noted. The liver is steatotic and mildly enlarged. No other significant findings in the visualized upper abdomen. Musculoskeletal: Osteopenia and degenerative change of the spine. No acute osseous findings. No aggressive regional bone lesion. No chest wall mass is seen. Review of the MIP images confirms the above findings. IMPRESSION: 1. Positive for a single small-volume subsegmental embolus in the lingula. No other emboli are seen. 2. Subsegmental emboli in the left lower lobe noted previously have cleared in the interval. 3. Cardiomegaly without evidence of acute right heart strain or venous dilatation. Chronic slight right chamber predominance. 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 4. Aortic and coronary artery atherosclerosis and lipomatous hypertrophy of the inter-atrial septum. 5. Trace pleural effusions with increased opacities in the posterior basal lower lobes which could be atelectasis or developing pneumonia. 6. Chronic lower lobe bronchial thickening. 7. Percutaneous cholecystostomy drain. 8. Hepatic steatosis and  mild hepatomegaly. 9. Osteopenia and degenerative change. Aortic Atherosclerosis (ICD10-I70.0). Electronically Signed: By: Almira Bar M.D. On: 09/16/2022 20:53   CT ABDOMEN PELVIS W CONTRAST  Result Date: 09/16/2022 CLINICAL DATA:  Right lower quadrant abdominal pain since yesterday, history of cholecystostomy tube EXAM: CT ABDOMEN AND PELVIS WITH CONTRAST TECHNIQUE: Multidetector CT imaging of the abdomen and pelvis was performed using the standard protocol following bolus administration of intravenous contrast. RADIATION DOSE REDUCTION: This exam was performed according to the departmental dose-optimization program which includes automated exposure control, adjustment of the mA and/or kV according to patient size and/or use of iterative reconstruction technique. CONTRAST:  OMNIPAQUE IOHEXOL 350 MG/ML SOLN COMPARISON:  09/16/2022, 08/29/2022 FINDINGS: Lower chest: Bibasilar hypoventilatory changes. No acute pleural or parenchymal lung disease. Atherosclerosis of the aorta and coronary vasculature. Hepatobiliary: Stable hemangioma right lobe liver. Otherwise the liver is unremarkable. Percutaneous cholecystostomy tube is seen coiled within the gallbladder lumen, with complete decompression of the gallbladder. There is mild pericholecystic fat stranding which may be related history of cholecystitis or indwelling drain. No biliary duct dilation. Pancreas: Unremarkable. No pancreatic ductal dilatation or surrounding inflammatory changes. Spleen: Normal in size without focal abnormality. Adrenals/Urinary Tract: Calcifications of the bilateral renal hila are likely vascular. No urinary tract calculi or obstructive uropathy. Kidneys enhance normally. The adrenals and bladder are unremarkable. Stomach/Bowel: No bowel obstruction or ileus. Diverticulosis of the descending and sigmoid colon without diverticulitis. Prior appendectomy. No bowel wall thickening. Vascular/Lymphatic: Aortic atherosclerosis. No  enlarged abdominal or pelvic lymph nodes. Reproductive: Prostate is unremarkable. Other: No free fluid or free intraperitoneal gas. No abdominal wall hernia. Musculoskeletal: No acute or destructive bony abnormalities. Reconstructed images demonstrate no additional findings. IMPRESSION: 1. Indwelling cholecystostomy tube without evidence of complication. 2. Diverticulosis without diverticulitis. 3.  Aortic Atherosclerosis (ICD10-I70.0). Electronically Signed   By: Sharlet Salina M.D.   On: 09/16/2022 20:36   DG Abdomen 1 View  Result Date: 09/16/2022 CLINICAL DATA:  Abdominal pain and chills. EXAM:  ABDOMEN - 1 VIEW COMPARISON:  None Available. FINDINGS: The bowel gas pattern is normal. A percutaneous biliary drainage catheter is seen overlying the right upper quadrant. No radio-opaque calculi or other significant radiographic abnormality are seen. IMPRESSION: Percutaneous biliary drainage catheter overlying the right upper quadrant. Electronically Signed   By: Aram Candela M.D.   On: 09/16/2022 19:30   DG Chest Portable 1 View  Result Date: 09/16/2022 CLINICAL DATA:  Abdominal pain and chills EXAM: PORTABLE CHEST 1 VIEW COMPARISON:  August 28, 2022 FINDINGS: The heart size and mediastinal contours are within normal limits. There is moderate severity calcification of the aortic arch. Low lung volumes are noted with mild, diffuse, chronic appearing increased interstitial lung markings. Mild atelectasis is seen within the bilateral lung bases. There is no evidence of a pleural effusion or pneumothorax. A biliary drainage catheter is seen overlying the lateral aspect of the right upper quadrant. Multilevel degenerative changes are seen throughout the thoracic spine. IMPRESSION: Low lung volumes with mild bibasilar atelectasis. Electronically Signed   By: Aram Candela M.D.   On: 09/16/2022 19:29   IR CHOLANGIOGRAM EXISTING TUBE  Result Date: 09/16/2022 INDICATION: History of acute cholecystitis, post  ultrasound cholecystostomy tube placement on 08/30/2022. EXAM: CHOLECYSTOSTOMY TUBE EVALUATION and ANTEGRADE CHOLANGIOGRAM COMPARISON:  IR fluoroscopy, 08/30/2022.  CTA chest, 08/29/2022. MEDICATIONS: None ANESTHESIA/SEDATION: None CONTRAST:  5mL OMNIPAQUE IOHEXOL 300 MG/ML SOLN - administered into the gallbladder fossa. FLUOROSCOPY TIME:  Fluoroscopic dose; 4 mGy COMPLICATIONS: None immediate. PROCEDURE: The patient was positioned supine on the fluoroscopy table. A preprocedural spot fluoroscopic image was obtained of the right upper abdominal quadrant existing cholecystostomy tube. Multiple spot fluoroscopic radiographic images were obtained of the RIGHT upper abdominal quadrant an existing cholecystostomy tube following injection of a small amount of contrast. Images were reviewed and discussed with the patient. The cholecystostomy tube was flushed with a small amount of saline. The previous stitch was removed, and a new 2-0 Ethilon stitch was placed. A dressing was placed. The patient tolerated the procedure well without immediate postprocedural complication. Procedure assisted by, Loran Senters IR RT FINDINGS: Preprocedural spot fluoroscopic image of the right upper abdominal quadrant demonstrates grossly unchanged positioning of the cholecystostomy tube with end coiled and locked overlying the expected location of the gallbladder fundus. Subsequent contrast injection demonstrates appropriate functionality of the cholecystostomy tube with brisk opacification of the gallbladder. There is passage of contrast from the gallbladder through the cystic and common bile ducts the level duodenum. No biliary ductal filling defect to suggest the presence of choledocholithiasis. IMPRESSION: 1. Appropriately positioned and functioning cholecystostomy tube. No exchange performed. 2. Patent cystic and common bile ducts. No fluoroscopic evidence of choledocholithiasis. PLAN: The patient will return to [facility] ([facility]) for routine drainage catheter evaluation and exchange in 6 weeks (from catheter placement). Roanna Banning, MD Vascular and [facility] Radiology Electronically Signed   By: Roanna Banning M.D.   On: 09/16/2022 14:15     Assessment & Plan:   IPF (idiopathic pulmonary fibrosis) (HCC) - IPF felt to be stable since December 2022 based on pulmonary function testing and symptoms - Continue Esbriet 801mg  three times a day - He is scheduled for spirometry with DLCO on 11/25/22 with OV afterwards  - He will need pre-op risk assessment at follow-up visit prior to cholecystectomy in mid-September   COPD (chronic obstructive pulmonary disease) (HCC) - Stable; No currently exacerbated - Continue Anoro Ellipta one puff daily   OSA (obstructive sleep apnea) -  Continue CPAP nightly - DME order placed for O2 adaptor   Chronic respiratory failure with hypoxia (HCC) - Started on oxygen during most recent hospitalization in [place] 2024- Approved for oxygen through 2027 - Continue 2L supplemental oxygen 24/7 to maintain O2 >88-90%.  - Needs to blend O2 with CPAP at night  - He did not desaturation during sit-to-stand test on 2L oxygen    Glenford Bayley, NP 10/13/2022

## 2022-10-13 NOTE — Assessment & Plan Note (Addendum)
-   IPF felt to be stable since December 2022 based on pulmonary function testing and symptoms - Continue Esbriet 801mg  three times a day - He is scheduled for spirometry with DLCO on 11/25/22 with OV afterwards  - He will need pre-op risk assessment at follow-up visit prior to cholecystectomy in mid-September

## 2022-10-13 NOTE — Telephone Encounter (Signed)
   [facility] Pre-operative Risk Assessment    Request for surgical clearance:  What type of surgery is being performed? Gall bladder removal    When is this surgery scheduled? TBD   What type of clearance is required (medical clearance vs. Pharmacy clearance to hold med vs. Both)? Both  Are there any medications that need to be held prior to surgery and how long?Not specified    Practice name and name of physician performing surgery? Dr. Sheliah Hatch  at [facility]  What is your office phone number: [phone]    7.   What is your office fax number: [phone]  8.   Anesthesia type (None, local, MAC, general) ? General Anesthesia   Robert Lang 10/13/2022, 10:26 AM  _________________________________________________________________   (provider comments below)

## 2022-10-13 NOTE — Assessment & Plan Note (Addendum)
-   Stable; No currently exacerbated - Continue Anoro Ellipta one puff daily

## 2022-10-13 NOTE — Patient Instructions (Addendum)
  Recommendations: - Continue Esbriet 801mg  three times a day - Continue Anoro one puff daily in the morning - Continue 2L oxygen 24/7 to maintain O2 >88-90% - Once you get adapter, blend oxygen into CPAP and resume wearing nightly  Orders: - Please do sit/stand x10 on 2L oxygen (record resting O2/HR and final O2/HR after 1 minute)   Follow-up: - Spirometry is scheduled for September 5th  - Please see if we can get patient an apt with DR. Ramaswamy anytime in [facility] clinic between September 5th and 10th

## 2022-10-13 NOTE — Telephone Encounter (Signed)
Patient seen by Dr. Bing Matter on 10/12/22 for preoperative cardiac evaluation. Per notes, Dr. Bing Matter would like to get an echo and recommends pt continue anticoagulation without interruption for pulmonary embolism and has reached out to surgeon to clarify urgency of cholecystectomy. Will await further guidance.

## 2022-10-13 NOTE — Assessment & Plan Note (Addendum)
-   Started on oxygen during most recent hospitalization in July 2024- Approved for oxygen through 2027 - Continue 2L supplemental oxygen 24/7 to maintain O2 >88-90%.  - Needs to blend O2 with CPAP at night  - He did not desaturation during sit-to-stand test on 2L oxygen

## 2022-10-13 NOTE — Assessment & Plan Note (Signed)
-   Continue CPAP nightly - DME order placed for O2 adaptor

## 2022-10-14 ENCOUNTER — Ambulatory Visit: Payer: Self-pay

## 2022-10-14 ENCOUNTER — Telehealth: Payer: Self-pay | Admitting: Primary Care

## 2022-10-14 DIAGNOSIS — G629 Polyneuropathy, unspecified: Secondary | ICD-10-CM | POA: Diagnosis not present

## 2022-10-14 DIAGNOSIS — I330 Acute and subacute infective endocarditis: Secondary | ICD-10-CM | POA: Diagnosis not present

## 2022-10-14 DIAGNOSIS — J432 Centrilobular emphysema: Secondary | ICD-10-CM | POA: Diagnosis not present

## 2022-10-14 DIAGNOSIS — J690 Pneumonitis due to inhalation of food and vomit: Secondary | ICD-10-CM | POA: Diagnosis not present

## 2022-10-14 DIAGNOSIS — I452 Bifascicular block: Secondary | ICD-10-CM | POA: Diagnosis not present

## 2022-10-14 DIAGNOSIS — J9601 Acute respiratory failure with hypoxia: Secondary | ICD-10-CM | POA: Diagnosis not present

## 2022-10-14 DIAGNOSIS — I252 Old myocardial infarction: Secondary | ICD-10-CM | POA: Diagnosis not present

## 2022-10-14 DIAGNOSIS — I4892 Unspecified atrial flutter: Secondary | ICD-10-CM | POA: Diagnosis not present

## 2022-10-14 DIAGNOSIS — I2693 Single subsegmental pulmonary embolism without acute cor pulmonale: Secondary | ICD-10-CM | POA: Diagnosis not present

## 2022-10-14 DIAGNOSIS — R6520 Severe sepsis without septic shock: Secondary | ICD-10-CM | POA: Diagnosis not present

## 2022-10-14 DIAGNOSIS — A419 Sepsis, unspecified organism: Secondary | ICD-10-CM | POA: Diagnosis not present

## 2022-10-14 DIAGNOSIS — J441 Chronic obstructive pulmonary disease with (acute) exacerbation: Secondary | ICD-10-CM | POA: Diagnosis not present

## 2022-10-14 DIAGNOSIS — I2510 Atherosclerotic heart disease of native coronary artery without angina pectoris: Secondary | ICD-10-CM | POA: Diagnosis not present

## 2022-10-14 DIAGNOSIS — J84112 Idiopathic pulmonary fibrosis: Secondary | ICD-10-CM | POA: Diagnosis not present

## 2022-10-14 DIAGNOSIS — F411 Generalized anxiety disorder: Secondary | ICD-10-CM | POA: Diagnosis not present

## 2022-10-14 DIAGNOSIS — I480 Paroxysmal atrial fibrillation: Secondary | ICD-10-CM | POA: Diagnosis not present

## 2022-10-14 NOTE — Telephone Encounter (Signed)
Fax received from Dr. Feliciana Rossetti with [facility] to perform a Gallbladder Removal on patient.  Patient needs surgery clearance. Surgery is Pending. Patient was seen on 10/13/22. Office protocol is a risk assessment can be sent to surgeon if patient has been seen in 60 days or less.   Sending to [facility] for risk assessment or recommendations if patient needs to be seen in office prior to surgical procedure.

## 2022-10-14 NOTE — Patient Instructions (Signed)
Visit Information  Thank you for taking time to visit with me today. Please don't hesitate to contact me if I can be of assistance to you.   Following are the goals we discussed today:   Goals Addressed             This Visit's Progress    Continue to improve post hospitalization       Interventions Today    Flowsheet Row Most Recent Value  Chronic Disease   Chronic disease during today's visit Other  General Interventions   General Interventions Discussed/Reviewed General Interventions Discussed, Doctor Visits, Durable Medical Equipment (DME)  Doctor Visits Discussed/Reviewed Doctor Visits Discussed, Doctor Visits Reviewed  [reviewed upcoming appointments]  Durable Medical Equipment (DME) Oxygen, BP Cuff  [oxygen with [facility], has pulse oximeter, awaiting CPAP adapter from [facility]]  Exercise Interventions   Exercise Discussed/Reviewed Exercise Discussed  [confirmed active with home health [facility]]  Education Interventions   Education Provided Provided Education  [confirmed with wife that she has provider contact numbers, also confirmed with wife they have IR contact number to call if problems with drain]  Provided Verbal Education On Medication, Exercise, When to see the doctor, Insurance Plans  [discussed importance of wearing CPAP at night, encourage to follow up with agency if does not hear anything by mid next week. discussed continue to communicate with insurance care manager to access benefits provided by insurance such as meals, etc.]  Pharmacy Interventions   Pharmacy Dicussed/Reviewed Pharmacy Topics Discussed, Medication Adherence, Affording Medications, Medications and their functions  [encouraged to complete assistance forms as soon as possible for Elliquis]  Safety Interventions   Safety Discussed/Reviewed Safety Discussed            Our next appointment is by telephone on 11/08/22 at 3:15 pm  Please call the care guide team at [phone] if you need to  cancel or reschedule your appointment.   If you are experiencing a Mental Health or Behavioral Health Crisis or need someone to talk to, please call the Suicide and Crisis Lifeline: 43  Kathyrn Sheriff, RN, MSN, BSN, CCM [facility] Care Coordinator [phone]

## 2022-10-14 NOTE — Patient Outreach (Signed)
  Care Coordination   Initial Visit Note   10/14/2022 Name: Oaklen Thiam MRN: [id number] DOB: 1950-08-23  Early Steel is a 72 y.o. year old male who sees [name], [name], [place] for primary care. I spoke with  [name] and wife Sotero Brinkmeyer by phone today.  What matters to the patients health and wellness today?  Patient anxious to have gallbladder surgery. Per wife they are unable to do surgery until September due to patient recent treatment of pulmonary embolism. She assist with medications and follow up appointments as scheduled. Ms. Deremer without questions or concerns. She reports she also get follow up from [facility] case manager and patient is active with [facility].   Goals Addressed             This Visit's Progress    Continue to improve post hospitalization       Interventions Today    Flowsheet Row Most Recent Value  Chronic Disease   Chronic disease during today's visit Other  General Interventions   General Interventions Discussed/Reviewed General Interventions Discussed, Doctor Visits, Durable Medical Equipment (DME)  Doctor Visits Discussed/Reviewed Doctor Visits Discussed, Doctor Visits Reviewed  [reviewed upcoming appointments]  Durable Medical Equipment (DME) Oxygen, BP Cuff  [oxygen with [facility], has pulse oximeter, awaiting CPAP adapter from [facility]]  Exercise Interventions   Exercise Discussed/Reviewed Exercise Discussed  [confirmed active with home [facility]]  Education Interventions   Education Provided Provided Education  [confirmed with wife that she has provider contact numbers, also confirmed with wife they have IR contact number to call if problems with drain]  Provided Verbal Education On Medication, Exercise, When to see the doctor, Insurance Plans  [discussed importance of wearing CPAP at night, encourage to follow up with agency if does not hear anything by mid next week. discussed continue to communicate with  insurance care manager to access benefits provided by insurance such as meals, etc.]  Pharmacy Interventions   Pharmacy Dicussed/Reviewed Pharmacy Topics Discussed, Medication Adherence, Affording Medications, Medications and their functions  [encouraged to complete assistance forms as soon as possible for Elliquis]  Safety Interventions   Safety Discussed/Reviewed Safety Discussed            SDOH assessments and interventions completed:  No  Care Coordination Interventions:  Yes, provided   Follow up plan: Follow up call scheduled for 11/08/22    Encounter Outcome:  Pt. Visit Completed   Kathyrn Sheriff, RN, MSN, BSN, CCM [facility] Care Coordinator [phone]

## 2022-10-15 ENCOUNTER — Ambulatory Visit (HOSPITAL_BASED_OUTPATIENT_CLINIC_OR_DEPARTMENT_OTHER)
Admission: RE | Admit: 2022-10-15 | Discharge: 2022-10-15 | Disposition: A | Discharge status: Home / Self Care | Payer: Medicare Other | Source: Ambulatory Visit | Attending: Family Medicine | Admitting: Family Medicine

## 2022-10-15 ENCOUNTER — Encounter: Payer: Self-pay | Admitting: Family Medicine

## 2022-10-15 ENCOUNTER — Ambulatory Visit (INDEPENDENT_AMBULATORY_CARE_PROVIDER_SITE_OTHER): Payer: Medicare Other | Admitting: Family Medicine

## 2022-10-15 VITALS — BP 122/76 | HR 81 | Temp 97.6°F | Resp 22 | Ht 71.0 in | Wt 199.6 lb

## 2022-10-15 DIAGNOSIS — K819 Cholecystitis, unspecified: Secondary | ICD-10-CM | POA: Diagnosis not present

## 2022-10-15 DIAGNOSIS — J449 Chronic obstructive pulmonary disease, unspecified: Secondary | ICD-10-CM

## 2022-10-15 DIAGNOSIS — J84112 Idiopathic pulmonary fibrosis: Secondary | ICD-10-CM | POA: Insufficient documentation

## 2022-10-15 DIAGNOSIS — I517 Cardiomegaly: Secondary | ICD-10-CM | POA: Diagnosis not present

## 2022-10-15 LAB — CBC WITH DIFFERENTIAL/PLATELET
Basophils Absolute: 0 10*3/uL (ref 0.0–0.1)
Basophils Relative: 0.5 % (ref 0.0–3.0)
Eosinophils Absolute: 0.2 10*3/uL (ref 0.0–0.7)
Eosinophils Relative: 2.9 % (ref 0.0–5.0)
HCT: 39.5 % (ref 39.0–52.0)
Hemoglobin: 12.6 g/dL — ABNORMAL LOW (ref 13.0–17.0)
Lymphocytes Relative: 22.4 % (ref 12.0–46.0)
Lymphs Abs: 1.6 10*3/uL (ref 0.7–4.0)
MCHC: 32 g/dL (ref 30.0–36.0)
MCV: 94.4 fl (ref 78.0–100.0)
Monocytes Absolute: 1 10*3/uL (ref 0.1–1.0)
Monocytes Relative: 14.5 % — ABNORMAL HIGH (ref 3.0–12.0)
Neutro Abs: 4.1 10*3/uL (ref 1.4–7.7)
Neutrophils Relative %: 59.7 % (ref 43.0–77.0)
Platelets: 397 10*3/uL (ref 150.0–400.0)
RBC: 4.18 Mil/uL — ABNORMAL LOW (ref 4.22–5.81)
RDW: 14.9 % (ref 11.5–15.5)
WBC: 7 10*3/uL (ref 4.0–10.5)

## 2022-10-15 LAB — BASIC METABOLIC PANEL
BUN: 10 mg/dL (ref 6–23)
CO2: 28 mEq/L (ref 19–32)
Calcium: 9 mg/dL (ref 8.4–10.5)
Chloride: 103 mEq/L (ref 96–112)
Creatinine, Ser: 0.84 mg/dL (ref 0.40–1.50)
GFR: 87.09 mL/min (ref >60.00)
Glucose, Bld: 87 mg/dL (ref 70–99)
Potassium: 4.9 mEq/L (ref 3.5–5.1)
Sodium: 139 mEq/L (ref 135–145)

## 2022-10-15 NOTE — Progress Notes (Signed)
Established Patient Office Visit  Subjective   Patient ID: Robert Lang, male    DOB: [birth year]  Age: 72 y.o. MRN: [id number]  Chief Complaint  Patient presents with   Hospitalization Follow-up    PE, hx of COPD    HPI Discussed the use of AI scribe software for clinical note transcription with the patient, who gave verbal consent to proceed.  History of Present Illness   The patient, with a history of gallbladder disease, pulmonary embolism, and idiopathic pulmonary fibrosis (IPF), presents for a follow-up after recent hospitalizations. The patient reports having been through a 'rough time' and describes the experience as 'hell.' He currently has a drain in place due to the gallbladder disease and is awaiting surgery, which has been scheduled for the middle of September. The delay in surgery is due to the presence of blood clots, which were diagnosed in June. The patient expresses frustration and discomfort with the situation, particularly with the drain and the need for oxygen therapy. He reports difficulty in breathing and has been put on oxygen therapy, which he finds restrictive and limiting. He is considering getting a portable oxygen concentrator for more mobility. The patient also mentions that he has been feeling very tired, especially in the evenings.      Patient Active Problem List   Diagnosis Date Noted   Chronic respiratory failure with hypoxia (HCC) 10/13/2022   Sepsis (HCC) 10/05/2022   History of COPD 09/17/2022   History of pulmonary embolism 09/17/2022   History of cholecystitis 09/17/2022   GAD (generalized anxiety disorder) 09/17/2022   Allergic rhinitis 09/17/2022   BPH (benign prostatic hyperplasia) 09/17/2022   Acute respiratory failure with hypoxia (HCC) 09/16/2022   Aspiration pneumonia of both lower lobes (HCC) 09/16/2022   Single subsegmental pulmonary embolism without acute cor pulmonale (HCC) 08/31/2022   COPD exacerbation (HCC) 08/31/2022   COPD  with exacerbation (HCC) 08/29/2022   Acute respiratory failure with hypoxemia (HCC) 08/28/2022   Centrilobular emphysema (HCC) 09/03/2021   Acute bacterial rhinosinusitis 09/03/2021   Hospital discharge follow-up 09/03/2021   Cognitive changes 09/03/2021   Recurrent syncope 08/27/2021   Acute metabolic encephalopathy 08/27/2021   Lactic acidosis 08/27/2021   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/27/2021   History of COVID-19 08/27/2021   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   Appendicitis 01/15/2021   Alcohol abuse 01/15/2021   OSA (obstructive sleep apnea) 01/15/2021   Severe sepsis (HCC) 01/15/2021   IPF (idiopathic pulmonary fibrosis) (HCC) 07/28/2020   Snoring 05/13/2020   Kidney stones    Hypertension    Hyperlipidemia    History of colon polyps    Heart attack (HCC)    GERD (gastroesophageal reflux disease)    Depression    CAD (coronary artery disease)    Paroxysmal atrial fibrillation (HCC)    Arthritis    Anxiety    Allergy    Smoker 01/11/2019   ETOH abuse 01/11/2019   Acute ST elevation myocardial infarction (STEMI) of inferolateral wall (HCC) 01/10/2019   Dyspnea on exertion 10/20/2018   Status post ablation of atrial flutter 07/06/2018   Coronary artery disease 07/06/2018   Essential hypertension 07/06/2018   Dyslipidemia, goal LDL below 70 07/06/2018   Smoking 07/06/2018   Neuropathy 07/06/2018   Dizziness 10/28/2017   Falls 10/28/2017   Heart palpitations 01/27/2017   Anticoagulated 07/01/2016   H/O amiodarone therapy 07/01/2016   PLMD (periodic limb movement disorder) 11/04/2015   Obstructive sleep apnea 08/05/2015   Atrial flutter (HCC) 06/26/2015   Chest  pain 06/26/2015   COPD (chronic obstructive pulmonary disease) (HCC) 06/26/2015   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 M75.00)   Allergic rhinitis due to pollen 09/03/2013   J30.1)  Formatting of this note might be different from the original. J30.1)   Allergy    Anticoagulated 07/01/2016   Anxiety    Arthritis    Atrial fibrillation (HCC)    Atrial flutter (HCC) 06/26/2015   CAD (coronary artery disease)    Chest pain 06/26/2015   COPD (chronic obstructive pulmonary disease) (HCC)    Coronary artery disease 07/06/2018   Cardiac catheterization 2017 showing 90% small diagonal branch disease   DDD (degenerative disc disease), cervical 09/03/2013   M50.90)  Formatting of this note might be different from the original. M50.90)   Depression    Depression    Depressive disorder 09/03/2013   Dizziness 10/28/2017   Dyslipidemia, goal LDL below 70 07/06/2018   Dyspnea on exertion 10/20/2018   Esophageal reflux 09/03/2013   Essential hypertension 07/06/2018   ETOH abuse 01/11/2019   6 pack of beer per day   Falls 10/28/2017   GERD (gastroesophageal reflux disease)    H/O amiodarone therapy 07/01/2016   Heart attack (HCC)    Heart palpitations 01/27/2017   History of colon polyps    Hyperlipidemia    Hypertension    IPF (idiopathic pulmonary fibrosis) (HCC)    Kidney stones    Neck pain 09/03/2013   Neuropathy 07/06/2018   Obstructive sleep apnea 08/05/2015   PLMD (periodic limb movement disorder) 11/04/2015   Polycythemia, 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 STORY: Due to alcohol/ tobacco   Pure hypercholesterolemia 09/03/2013   E78.0)  Formatting of this  note might be different from the original. E78.0)   Screening for prostate cancer 09/03/2013   Smoker 01/11/2019   Smoking 07/06/2018   Status post ablation of atrial flutter 07/06/2018   2017   Past Surgical History:  Procedure Laterality Date   ATRIAL FIBRILLATION ABLATION  10/2015   BACK SURGERY     CARDIOVERSION  2017   CATARACT EXTRACTION Bilateral 2017   March and April 2017   COLONOSCOPY  2018   CORONARY STENT PLACEMENT  2012   CORONARY/GRAFT ACUTE MI REVASCULARIZATION N/A 01/10/2019   Procedure: CORONARY/GRAFT ACUTE MI REVASCULARIZATION;  Surgeon: Marykay Lex, MD;  Location: [facility] INVASIVE CV LAB;  Service: Cardiovascular;  Laterality: N/A;   INGUINAL HERNIA REPAIR     over 20 years ago   IR CHOLANGIOGRAM EXISTING TUBE  09/16/2022   IR EXCHANGE BILIARY DRAIN  10/06/2022   IR PERC CHOLECYSTOSTOMY  08/30/2022   LAPAROSCOPIC APPENDECTOMY N/A 01/17/2021   Procedure: APPENDECTOMY LAPAROSCOPIC;  Surgeon: Quentin Ore, MD;  Location: [facility] OR;  Service: General;  Laterality: N/A;   LEFT HEART CATH AND CORONARY ANGIOGRAPHY N/A 01/10/2019   Procedure: LEFT HEART CATH AND CORONARY ANGIOGRAPHY;  Surgeon: Bryan Lemma  W, MD;  Location: [facility] INVASIVE CV LAB;  Service: Cardiovascular;  Laterality: N/A;   LUMBAR LAMINECTOMY Bilateral 04/05/2016   L2-L5    VENTRAL HERNIA REPAIR  2018   Social History   Tobacco Use   Smoking status: Former    Current packs/day: 0.00    Average packs/day: 1 pack/day for 30.0 years (30.0 ttl pk-yrs)    Types: Cigarettes    Start date: 01/09/1989    Quit date: 01/10/2019    Years since quitting: 3.7   Smokeless tobacco: Former  Building services engineer status: Never Used  Substance Use Topics   Alcohol use: Yes    Comment: 8oz of wine a day   Drug use: Never   Social History   Socioeconomic History   Marital status: Married    Spouse name: Not on file   Number of children: Not on file   Years of education: Not on file   Highest education  level: Not on file  Occupational History   Not on file  Tobacco Use   Smoking status: Former    Current packs/day: 0.00    Average packs/day: 1 pack/day for 30.0 years (30.0 ttl pk-yrs)    Types: Cigarettes    Start date: 01/09/1989    Quit date: 01/10/2019    Years since quitting: 3.7   Smokeless tobacco: Former  Building services engineer status: Never Used  Substance and Sexual Activity   Alcohol use: Yes    Comment: 8oz of wine a day   Drug use: Never   Sexual activity: Not on file  Other Topics Concern   Not on file  Social History Narrative   Not on file   Social Determinants of Health   Financial Resource Strain: Low Risk  (12/22/2021)   Overall Financial Resource Strain (CARDIA)    Difficulty of Paying Living Expenses: Not hard at all  Food Insecurity: No Food Insecurity (10/06/2022)   Hunger Vital Sign    Worried About Running Out of Food in the Last Year: Never true    Ran Out of Food in the Last Year: Never true  Transportation Needs: No Transportation Needs (10/06/2022)   PRAPARE - Administrator, Civil Service (Medical): No    Lack of Transportation (Non-Medical): No  Physical Activity: Inactive (12/22/2021)   Exercise Vital Sign    Days of Exercise per Week: 0 days    Minutes of Exercise per Session: 0 min  Stress: No Stress Concern Present (12/22/2021)   [employer] of Occupational Health - Occupational Stress Questionnaire    Feeling of Stress : Not at all  Social Connections: Moderately Integrated (12/22/2021)   Social Connection and Isolation Panel [NHANES]    Frequency of Communication with Friends and Family: More than three times a week    Frequency of Social Gatherings with Friends and Family: More than three times a week    Attends Religious Services: More than 4 times per year    Active Member of [employer] or Organizations: No    Attends Banker Meetings: Never    Marital Status: Married  Catering manager Violence: Not At Risk  (10/06/2022)   Humiliation, Afraid, Rape, and Kick questionnaire    Fear of Current or Ex-Partner: No    Emotionally Abused: No    Physically Abused: No    Sexually Abused: No   Family Status  Relation Name Status   Mother  (Not Specified)   Father  (Not Specified)  Brother  (Not Specified)  No partnership data on file   Family History  Problem Relation Age of Onset   Anxiety disorder Mother    Alcohol abuse Father    Colon cancer Father    Depression Brother    Alcohol abuse Brother    Throat cancer Brother    Allergies  Allergen Reactions   Naproxen Other (See Comments)    (Naprosyn *ANALGESICS - ANTI-INFLAMMATORY*) Nausea, Abdominal pain   Silodosin Other (See Comments)    Low blood pressure      Review of Systems  Constitutional:  Negative for fever and malaise/fatigue.  HENT:  Negative for congestion.   Eyes:  Negative for blurred vision.  Respiratory:  Negative for cough and shortness of breath.   Cardiovascular:  Negative for chest pain, palpitations and leg swelling.  Gastrointestinal:  Negative for abdominal pain, blood in stool, nausea and vomiting.  Genitourinary:  Negative for dysuria and frequency.  Musculoskeletal:  Negative for back pain and falls.  Skin:  Negative for rash.  Neurological:  Negative for dizziness, loss of consciousness and headaches.  Endo/Heme/Allergies:  Negative for environmental allergies.  Psychiatric/Behavioral:  Negative for depression. The patient is not nervous/anxious.       Objective:     BP 122/76 (BP Location: Right Arm, Patient Position: Sitting, Cuff Size: Normal)   Pulse 81   Temp 97.6 F (36.4 C) (Oral)   Resp (!) 22   Ht 5\' 11"  (1.803 m)   Wt 199 lb 9.6 oz (90.5 kg)   SpO2 96%   BMI 27.84 kg/m  BP Readings from Last 3 Encounters:  10/15/22 122/76  10/13/22 108/62  10/12/22 100/62   Wt Readings from Last 3 Encounters:  10/15/22 199 lb 9.6 oz (90.5 kg)  10/13/22 203 lb 9.6 oz (92.4 kg)  10/12/22 203  lb 12.8 oz (92.4 kg)   SpO2 Readings from Last 3 Encounters:  10/15/22 96%  10/13/22 98%  10/12/22 93%      Physical Exam Vitals and nursing note reviewed.  Constitutional:      General: He is not in acute distress.    Appearance: Normal appearance. He is well-developed.  HENT:     Head: Normocephalic and atraumatic.  Eyes:     General: No scleral icterus.       Right eye: No discharge.        Left eye: No discharge.  Cardiovascular:     Rate and Rhythm: Normal rate and regular rhythm.     Heart sounds: No murmur heard. Pulmonary:     Effort: Pulmonary effort is normal. No respiratory distress.     Breath sounds: Normal breath sounds.  Musculoskeletal:        General: Normal range of motion.     Cervical back: Normal range of motion and neck supple.     Right lower leg: No edema.     Left lower leg: No edema.  Skin:    General: Skin is warm and dry.  Neurological:     General: No focal deficit present.     Mental Status: He is alert and oriented to person, place, and time.  Psychiatric:        Mood and Affect: Mood normal.        Behavior: Behavior normal.        Thought Content: Thought content normal.        Judgment: Judgment normal.      No results found for any visits on 10/15/22.  Last CBC Lab Results  Component Value Date   WBC 7.5 10/09/2022   HGB 11.5 (L) 10/09/2022   HCT 35.8 (L) 10/09/2022   MCV 93.7 10/09/2022   MCH 30.1 10/09/2022   RDW 14.3 10/09/2022   PLT 173 10/09/2022   Last metabolic panel Lab Results  Component Value Date   GLUCOSE 106 (H) 10/09/2022   NA 138 10/09/2022   K 4.0 10/09/2022   CL 101 10/09/2022   CO2 27 10/09/2022   BUN <5 (L) 10/09/2022   CREATININE 0.73 10/09/2022   GFRNONAA >60 10/09/2022   CALCIUM 8.5 (L) 10/09/2022   PHOS 3.6 09/17/2022   PROT 6.2 (L) 10/09/2022   ALBUMIN 2.5 (L) 10/09/2022   LABGLOB 2.6 04/20/2019   AGRATIO 1.7 04/20/2019   BILITOT 0.4 10/09/2022   ALKPHOS 49 10/09/2022   AST 15  10/09/2022   ALT 16 10/09/2022   ANIONGAP 10 10/09/2022   Last lipids Lab Results  Component Value Date   CHOL 144 12/30/2021   HDL 25.00 (L) 12/30/2021   LDLCALC 58 04/03/2020   LDLDIRECT 80.0 12/30/2021   TRIG 342.0 (H) 12/30/2021   CHOLHDL 6 12/30/2021   Last hemoglobin A1c Lab Results  Component Value Date   HGBA1C 5.9 12/30/2021   Last thyroid functions Lab Results  Component Value Date   TSH 1.232 01/16/2021   Last vitamin D Lab Results  Component Value Date   VD25OH 64 07/09/2022   Last vitamin B12 and Folate Lab Results  Component Value Date   VITAMINB12 832 09/02/2021      The ASCVD Risk score (Arnett DK, et al., 2019) failed to calculate for the following reasons:   The patient has a prior MI or stroke diagnosis    Assessment & Plan:   Problem List Items Addressed This Visit       Unprioritized   IPF (idiopathic pulmonary fibrosis) (HCC)   Relevant Orders   DG Chest 2 View   COPD (chronic obstructive pulmonary disease) (HCC)   Relevant Orders   DG Chest 2 View   Other Visit Diagnoses     Cholecystitis    -  Primary   Relevant Orders   Basic metabolic panel   CBC with Differential/Platelet     Assessment and Plan    Cholecystitis with drain placement: Recent hospitalizations due to drain obstruction. Patient is frustrated with the drain and is eager for cholecystectomy. Surgery is scheduled for mid-September due to recent pulmonary embolism. -Continue current management of drain. -Ensure follow-up with surgeon.  Pulmonary embolism: Recent hospitalization and initiation of Eliquis. Patient is frustrated with the delay in cholecystectomy due to the need for anticoagulation. -Continue Eliquis as prescribed. -Ensure follow-up with pulmonologist and cardiologist.  Idiopathic Pulmonary Fibrosis (IPF): Patient is on home oxygen and is frustrated with the limitations it imposes. Oxygen saturation is currently stable in the mid-90s. -Continue  current oxygen therapy. -Explore options for a more portable oxygen system with the oxygen supply company.  General Health Maintenance: -Repeat labs and chest x-ray today as ordered by the hospitalist.         No follow-ups on file.    Donato Schultz, DO

## 2022-10-15 NOTE — Telephone Encounter (Signed)
He has an apt in September with Dr. Marchelle Gearing with PFTs, since he was just hospitalized his risk factors are higher- would recommend this be re-address at visit in September before his surgery which is tentatively scheduled for mid September

## 2022-10-17 DIAGNOSIS — J432 Centrilobular emphysema: Secondary | ICD-10-CM | POA: Diagnosis not present

## 2022-10-17 DIAGNOSIS — J690 Pneumonitis due to inhalation of food and vomit: Secondary | ICD-10-CM | POA: Diagnosis not present

## 2022-10-17 DIAGNOSIS — I2510 Atherosclerotic heart disease of native coronary artery without angina pectoris: Secondary | ICD-10-CM | POA: Diagnosis not present

## 2022-10-17 DIAGNOSIS — G629 Polyneuropathy, unspecified: Secondary | ICD-10-CM | POA: Diagnosis not present

## 2022-10-17 DIAGNOSIS — F411 Generalized anxiety disorder: Secondary | ICD-10-CM | POA: Diagnosis not present

## 2022-10-17 DIAGNOSIS — I2693 Single subsegmental pulmonary embolism without acute cor pulmonale: Secondary | ICD-10-CM | POA: Diagnosis not present

## 2022-10-17 DIAGNOSIS — I330 Acute and subacute infective endocarditis: Secondary | ICD-10-CM | POA: Diagnosis not present

## 2022-10-17 DIAGNOSIS — I452 Bifascicular block: Secondary | ICD-10-CM | POA: Diagnosis not present

## 2022-10-17 DIAGNOSIS — J441 Chronic obstructive pulmonary disease with (acute) exacerbation: Secondary | ICD-10-CM | POA: Diagnosis not present

## 2022-10-17 DIAGNOSIS — I480 Paroxysmal atrial fibrillation: Secondary | ICD-10-CM | POA: Diagnosis not present

## 2022-10-17 DIAGNOSIS — J9601 Acute respiratory failure with hypoxia: Secondary | ICD-10-CM | POA: Diagnosis not present

## 2022-10-17 DIAGNOSIS — I252 Old myocardial infarction: Secondary | ICD-10-CM | POA: Diagnosis not present

## 2022-10-17 DIAGNOSIS — A419 Sepsis, unspecified organism: Secondary | ICD-10-CM | POA: Diagnosis not present

## 2022-10-17 DIAGNOSIS — R6520 Severe sepsis without septic shock: Secondary | ICD-10-CM | POA: Diagnosis not present

## 2022-10-17 DIAGNOSIS — J84112 Idiopathic pulmonary fibrosis: Secondary | ICD-10-CM | POA: Diagnosis not present

## 2022-10-17 DIAGNOSIS — I4892 Unspecified atrial flutter: Secondary | ICD-10-CM | POA: Diagnosis not present

## 2022-10-18 ENCOUNTER — Telehealth: Payer: Self-pay | Admitting: Medical

## 2022-10-18 NOTE — Telephone Encounter (Signed)
Pt's wife states he is wondering if we can order a portable oxygen tank from [employer]. Pt was seen 7/ 26 by Dr.Lowne. Please advise.

## 2022-10-19 ENCOUNTER — Encounter: Payer: Self-pay | Admitting: Medical

## 2022-10-19 NOTE — Telephone Encounter (Signed)
Pt called wanting to follow up on previous request from yesterday. Please advise pt.

## 2022-10-19 NOTE — Telephone Encounter (Signed)
Pt called nd lvm to return call , to let pt know his orders have been sent to pulmonology

## 2022-10-19 NOTE — Telephone Encounter (Signed)
Orders were fwd to pulmonology will fwd again

## 2022-10-20 ENCOUNTER — Encounter: Payer: Self-pay | Admitting: Internal Medicine

## 2022-10-20 DIAGNOSIS — I4892 Unspecified atrial flutter: Secondary | ICD-10-CM | POA: Diagnosis not present

## 2022-10-20 DIAGNOSIS — J432 Centrilobular emphysema: Secondary | ICD-10-CM | POA: Diagnosis not present

## 2022-10-20 DIAGNOSIS — J441 Chronic obstructive pulmonary disease with (acute) exacerbation: Secondary | ICD-10-CM | POA: Diagnosis not present

## 2022-10-20 DIAGNOSIS — I330 Acute and subacute infective endocarditis: Secondary | ICD-10-CM | POA: Diagnosis not present

## 2022-10-20 DIAGNOSIS — I480 Paroxysmal atrial fibrillation: Secondary | ICD-10-CM | POA: Diagnosis not present

## 2022-10-20 DIAGNOSIS — G629 Polyneuropathy, unspecified: Secondary | ICD-10-CM | POA: Diagnosis not present

## 2022-10-20 DIAGNOSIS — J84112 Idiopathic pulmonary fibrosis: Secondary | ICD-10-CM | POA: Diagnosis not present

## 2022-10-20 DIAGNOSIS — R6520 Severe sepsis without septic shock: Secondary | ICD-10-CM | POA: Diagnosis not present

## 2022-10-20 DIAGNOSIS — J9601 Acute respiratory failure with hypoxia: Secondary | ICD-10-CM | POA: Diagnosis not present

## 2022-10-20 DIAGNOSIS — A419 Sepsis, unspecified organism: Secondary | ICD-10-CM | POA: Diagnosis not present

## 2022-10-20 DIAGNOSIS — F411 Generalized anxiety disorder: Secondary | ICD-10-CM | POA: Diagnosis not present

## 2022-10-20 DIAGNOSIS — J690 Pneumonitis due to inhalation of food and vomit: Secondary | ICD-10-CM | POA: Diagnosis not present

## 2022-10-20 DIAGNOSIS — I452 Bifascicular block: Secondary | ICD-10-CM | POA: Diagnosis not present

## 2022-10-20 DIAGNOSIS — I252 Old myocardial infarction: Secondary | ICD-10-CM | POA: Diagnosis not present

## 2022-10-20 DIAGNOSIS — G4733 Obstructive sleep apnea (adult) (pediatric): Secondary | ICD-10-CM | POA: Diagnosis not present

## 2022-10-20 DIAGNOSIS — I2510 Atherosclerotic heart disease of native coronary artery without angina pectoris: Secondary | ICD-10-CM | POA: Diagnosis not present

## 2022-10-20 DIAGNOSIS — I2693 Single subsegmental pulmonary embolism without acute cor pulmonale: Secondary | ICD-10-CM | POA: Diagnosis not present

## 2022-10-20 NOTE — Telephone Encounter (Signed)
Closing encounter. My chart message has been sent to patient

## 2022-10-21 DIAGNOSIS — I480 Paroxysmal atrial fibrillation: Secondary | ICD-10-CM | POA: Diagnosis not present

## 2022-10-21 DIAGNOSIS — J690 Pneumonitis due to inhalation of food and vomit: Secondary | ICD-10-CM | POA: Diagnosis not present

## 2022-10-21 DIAGNOSIS — J9601 Acute respiratory failure with hypoxia: Secondary | ICD-10-CM | POA: Diagnosis not present

## 2022-10-21 DIAGNOSIS — I2693 Single subsegmental pulmonary embolism without acute cor pulmonale: Secondary | ICD-10-CM | POA: Diagnosis not present

## 2022-10-21 DIAGNOSIS — A419 Sepsis, unspecified organism: Secondary | ICD-10-CM | POA: Diagnosis not present

## 2022-10-21 DIAGNOSIS — I252 Old myocardial infarction: Secondary | ICD-10-CM | POA: Diagnosis not present

## 2022-10-21 DIAGNOSIS — R6520 Severe sepsis without septic shock: Secondary | ICD-10-CM | POA: Diagnosis not present

## 2022-10-21 DIAGNOSIS — I2510 Atherosclerotic heart disease of native coronary artery without angina pectoris: Secondary | ICD-10-CM | POA: Diagnosis not present

## 2022-10-21 DIAGNOSIS — G629 Polyneuropathy, unspecified: Secondary | ICD-10-CM | POA: Diagnosis not present

## 2022-10-21 DIAGNOSIS — J441 Chronic obstructive pulmonary disease with (acute) exacerbation: Secondary | ICD-10-CM | POA: Diagnosis not present

## 2022-10-21 DIAGNOSIS — J84112 Idiopathic pulmonary fibrosis: Secondary | ICD-10-CM | POA: Diagnosis not present

## 2022-10-21 DIAGNOSIS — I330 Acute and subacute infective endocarditis: Secondary | ICD-10-CM | POA: Diagnosis not present

## 2022-10-21 DIAGNOSIS — I452 Bifascicular block: Secondary | ICD-10-CM | POA: Diagnosis not present

## 2022-10-21 DIAGNOSIS — J432 Centrilobular emphysema: Secondary | ICD-10-CM | POA: Diagnosis not present

## 2022-10-21 DIAGNOSIS — I4892 Unspecified atrial flutter: Secondary | ICD-10-CM | POA: Diagnosis not present

## 2022-10-21 DIAGNOSIS — F411 Generalized anxiety disorder: Secondary | ICD-10-CM | POA: Diagnosis not present

## 2022-10-22 ENCOUNTER — Telehealth: Payer: Self-pay

## 2022-10-22 NOTE — Telephone Encounter (Signed)
Provider section of application completed and faxed to [facility]. Form processed to be scan

## 2022-10-25 ENCOUNTER — Telehealth: Payer: Self-pay | Admitting: Internal Medicine

## 2022-10-26 ENCOUNTER — Encounter: Payer: Self-pay | Admitting: Behavioral Health

## 2022-10-26 ENCOUNTER — Ambulatory Visit: Payer: Medicare Other | Admitting: Behavioral Health

## 2022-10-26 DIAGNOSIS — J441 Chronic obstructive pulmonary disease with (acute) exacerbation: Secondary | ICD-10-CM | POA: Diagnosis not present

## 2022-10-26 DIAGNOSIS — R454 Irritability and anger: Secondary | ICD-10-CM

## 2022-10-26 DIAGNOSIS — I252 Old myocardial infarction: Secondary | ICD-10-CM | POA: Diagnosis not present

## 2022-10-26 DIAGNOSIS — F411 Generalized anxiety disorder: Secondary | ICD-10-CM | POA: Diagnosis not present

## 2022-10-26 DIAGNOSIS — G4700 Insomnia, unspecified: Secondary | ICD-10-CM

## 2022-10-26 DIAGNOSIS — J432 Centrilobular emphysema: Secondary | ICD-10-CM | POA: Diagnosis not present

## 2022-10-26 DIAGNOSIS — R6520 Severe sepsis without septic shock: Secondary | ICD-10-CM | POA: Diagnosis not present

## 2022-10-26 DIAGNOSIS — J9601 Acute respiratory failure with hypoxia: Secondary | ICD-10-CM | POA: Diagnosis not present

## 2022-10-26 DIAGNOSIS — G629 Polyneuropathy, unspecified: Secondary | ICD-10-CM | POA: Diagnosis not present

## 2022-10-26 DIAGNOSIS — I2693 Single subsegmental pulmonary embolism without acute cor pulmonale: Secondary | ICD-10-CM | POA: Diagnosis not present

## 2022-10-26 DIAGNOSIS — F32A Depression, unspecified: Secondary | ICD-10-CM

## 2022-10-26 DIAGNOSIS — I4892 Unspecified atrial flutter: Secondary | ICD-10-CM | POA: Diagnosis not present

## 2022-10-26 DIAGNOSIS — A419 Sepsis, unspecified organism: Secondary | ICD-10-CM | POA: Diagnosis not present

## 2022-10-26 DIAGNOSIS — I2510 Atherosclerotic heart disease of native coronary artery without angina pectoris: Secondary | ICD-10-CM | POA: Diagnosis not present

## 2022-10-26 DIAGNOSIS — I330 Acute and subacute infective endocarditis: Secondary | ICD-10-CM | POA: Diagnosis not present

## 2022-10-26 DIAGNOSIS — J84112 Idiopathic pulmonary fibrosis: Secondary | ICD-10-CM | POA: Diagnosis not present

## 2022-10-26 DIAGNOSIS — I452 Bifascicular block: Secondary | ICD-10-CM | POA: Diagnosis not present

## 2022-10-26 DIAGNOSIS — J690 Pneumonitis due to inhalation of food and vomit: Secondary | ICD-10-CM | POA: Diagnosis not present

## 2022-10-26 DIAGNOSIS — I480 Paroxysmal atrial fibrillation: Secondary | ICD-10-CM | POA: Diagnosis not present

## 2022-10-26 MED ORDER — LAMOTRIGINE 100 MG PO TABS
100.0000 mg | ORAL_TABLET | Freq: Every day | ORAL | 1 refills | Status: DC
Start: 2022-10-26 — End: 2022-12-28

## 2022-10-26 MED ORDER — BUPROPION HCL ER (XL) 300 MG PO TB24
300.0000 mg | ORAL_TABLET | Freq: Every day | ORAL | 1 refills | Status: DC
Start: 2022-10-26 — End: 2023-05-03

## 2022-10-26 MED ORDER — BUSPIRONE HCL 15 MG PO TABS
15.0000 mg | ORAL_TABLET | Freq: Three times a day (TID) | ORAL | 3 refills | Status: DC
Start: 2022-10-26 — End: 2022-12-28

## 2022-10-26 MED ORDER — TRAZODONE HCL 50 MG PO TABS: 50.0000 mg | ORAL_TABLET | Freq: Every day | ORAL | 1 refills | Status: DC

## 2022-10-26 MED ORDER — SERTRALINE HCL 100 MG PO TABS
200.0000 mg | ORAL_TABLET | Freq: Every day | ORAL | 1 refills | Status: DC
Start: 2022-10-26 — End: 2022-12-28

## 2022-10-26 NOTE — Progress Notes (Signed)
[facility]  Patient ID: Robert Lang,  MRN: [id number]  PCP: [name], [name]  Date of Evaluation: 10/26/2022 Time spent:30 minutes  Chief Complaint:  Chief Complaint   Anxiety; Depression; Follow-up; Medication Refill; Patient Education     HISTORY/CURRENT STATUS: HPI  72 yo  male presents for follow up and medication management. His wife was with him today with his consent.  Says he has noticed some improvement since increasing Zoloft and adding Lamictal.  He is feeling down because now on 02  and rolls a tank. Has a drain while awaiting cholecystectomy. Wife says his agitation and irritability has improved about 50%. He is open to adjusting his medication this visit.  Sleep is ok but utilizing Ambien and Trazodone. Sleeps with C-pap.  Rates anxiety 3/10 and depression 5/10. Denies hx of mania, no psychosis. No auditory or visual hallucinations. SI or HI       Individual Medical History/ Review of Systems: Changes? :No   Allergies: Naproxen and Silodosin  Current Medications:  Current Outpatient Medications:    traZODone (DESYREL) 50 MG tablet, Take 1 tablet (50 mg total) by mouth at bedtime., Disp: 90 tablet, Rfl: 1   acetaminophen (TYLENOL) 500 MG tablet, Take 1 tablet (500 mg total) by mouth every 8 (eight) hours as needed for moderate pain., Disp: 100 tablet, Rfl: 0   apixaban (ELIQUIS) 5 MG TABS tablet, Take 1 tablet (5 mg total) by mouth 2 (two) times daily., Disp: 60 tablet, Rfl: 0   atorvastatin (LIPITOR) 80 MG tablet, Take 1 tablet (80 mg total) by mouth daily. (Patient taking differently: Take 80 mg by mouth every evening.), Disp: 90 tablet, Rfl: 2   buPROPion (WELLBUTRIN XL) 300 MG 24 hr tablet, Take 1 tablet (300 mg total) by mouth daily., Disp: 90 tablet, Rfl: 1   busPIRone (BUSPAR) 15 MG tablet, Take 1 tablet (15 mg total) by mouth 3 (three) times daily., Disp: 90 tablet, Rfl: 3   Cholecalciferol (VITAMIN D3) 25 MCG (1000 UT) CAPS, Take  2 capsules by mouth 2 (two) times daily., Disp: , Rfl:    docusate sodium (COLACE) 100 MG capsule, Take 100 mg by mouth daily., Disp: , Rfl:    famotidine (PEPCID) 40 MG tablet, TAKE 1 TABLET(40 MG) BY MOUTH AT BEDTIME (Patient taking differently: Take 40 mg by mouth at bedtime.), Disp: 90 tablet, Rfl: 2   fexofenadine (ALLEGRA) 180 MG tablet, Take 180 mg by mouth at bedtime. , Disp: , Rfl:    finasteride (PROSCAR) 5 MG tablet, Take 1 tablet (5 mg total) by mouth daily., Disp: 90 tablet, Rfl: 1   lamoTRIgine (LAMICTAL) 100 MG tablet, Take 1 tablet (100 mg total) by mouth daily., Disp: 30 tablet, Rfl: 1   levalbuterol (XOPENEX) 1.25 MG/0.5ML nebulizer solution, Take 1.25 mg by nebulization every 4 (four) hours as needed for wheezing or shortness of breath., Disp: 1 each, Rfl: 12   levofloxacin (LEVAQUIN) 750 MG tablet, Take 1 tablet (750 mg total) by mouth daily. (Patient not taking: Reported on 10/13/2022), Disp: 3 tablet, Rfl: 0   LORazepam (ATIVAN) 0.5 MG tablet, Take one tablet by mouth only for severe anxiety or agitation (Patient taking differently: Take 0.5 mg by mouth daily as needed for anxiety. Take one tablet by mouth only for severe anxiety or agitation. Has not taking medication yet.), Disp: 30 tablet, Rfl: 1   Melatonin 10 MG TABS, Take 1 tablet by mouth at bedtime., Disp: , Rfl:    Multiple Vitamins-Minerals (PRESERVISION AREDS  PO), Take 1 capsule by mouth in the morning and at bedtime., Disp: , Rfl:    nitroGLYCERIN (NITROSTAT) 0.4 MG SL tablet, Place 1 tablet (0.4 mg total) under the tongue every 5 (five) minutes as needed for chest pain., Disp: 25 tablet, Rfl: 3   ondansetron (ZOFRAN) 4 MG tablet, Take 1 tablet (4 mg total) by mouth every 8 (eight) hours as needed for nausea or vomiting., Disp: 20 tablet, Rfl: 0   oxyCODONE (OXY IR/ROXICODONE) 5 MG immediate release tablet, Take 1 tablet (5 mg total) by mouth every 12 (twelve) hours as needed for severe pain., Disp: 6 tablet, Rfl: 0    pantoprazole (PROTONIX) 40 MG tablet, TAKE 1 TABLET(40 MG) BY MOUTH DAILY (Patient taking differently: Take 40 mg by mouth daily.), Disp: 90 tablet, Rfl: 1   Pirfenidone (ESBRIET) 801 MG TABS, Take 1 tablet (801 mg total) by mouth 3 (three) times daily., Disp: 270 tablet, Rfl: 1   Polyethylene Glycol 3350 (MIRALAX PO), Take 17 g by mouth daily., Disp: , Rfl:    pregabalin (LYRICA) 150 MG capsule, Take 1 capsule (150 mg total) by mouth 2 (two) times daily., Disp: 90 capsule, Rfl: 5   ranolazine (RANEXA) 1000 MG SR tablet, Take 1 tablet (1,000 mg total) by mouth 2 (two) times daily., Disp: 180 tablet, Rfl: 1   sertraline (ZOLOFT) 100 MG tablet, Take 2 tablets (200 mg total) by mouth at bedtime., Disp: 180 tablet, Rfl: 1   Sodium Chloride Flush (NORMAL SALINE FLUSH) 0.9 % SOLN, Flush drain with 5 mLs daily. Discard remaining 5ml in each syringe., Disp: 200 mL, Rfl: 0   traZODone (DESYREL) 50 MG tablet, TAKE 1 TABLET(50 MG) BY MOUTH AT BEDTIME AS NEEDED FOR SLEEP. MAY TAKE 1/2 TABLET TO 1 TABLET FOR SLEEP, AS NEEDED (Patient taking differently: Take 50 mg by mouth at bedtime as needed for sleep.), Disp: 30 tablet, Rfl: 3   umeclidinium-vilanterol (ANORO ELLIPTA) 62.5-25 MCG/ACT AEPB, Inhale 1 puff into the lungs daily., Disp: 60 each, Rfl: 0   vitamin B-12 (CYANOCOBALAMIN) 1000 MCG tablet, Take 1,000 mcg by mouth daily., Disp: , Rfl:    zolpidem (AMBIEN) 5 MG tablet, Take 1 tablet (5 mg total) by mouth at bedtime as needed for sleep., Disp: 30 tablet, Rfl: 5 Medication Side Effects: none  Family Medical/ Social History: Changes? No  MENTAL HEALTH EXAM:  There were no vitals taken for this visit.There is no height or weight on file to calculate BMI.  General Appearance: Casual, Neat, Well Groomed, and oxygen tank  Eye Contact:  Good  Speech:  Clear and Coherent  Volume:  Normal  Mood:  Anxious, Depressed, and Dysphoric  Affect:  Appropriate, Depressed, and Anxious  Thought Process:  Coherent   Orientation:  Full (Time, Place, and Person)  Thought Content: Logical   Suicidal Thoughts:  No  Homicidal Thoughts:  No  Memory:  WNL  Judgement:  Good  Insight:  Good  Psychomotor Activity:  Normal  Concentration:  Concentration: Good  Recall:  Good  Fund of Knowledge: Good  Language: Good  Assets:  Desire for Improvement Physical Health Resilience  ADL's:  Intact  Cognition: WNL  Prognosis:  Good    DIAGNOSES:    ICD-10-CM   1. Depressive disorder  F32.A buPROPion (WELLBUTRIN XL) 300 MG 24 hr tablet    sertraline (ZOLOFT) 100 MG tablet    2. Generalized anxiety disorder  F41.1 busPIRone (BUSPAR) 15 MG tablet    buPROPion (WELLBUTRIN XL) 300 MG  24 hr tablet    sertraline (ZOLOFT) 100 MG tablet    3. Insomnia, unspecified type  G47.00 traZODone (DESYREL) 50 MG tablet    4. Irritability  R45.4 lamoTRIgine (LAMICTAL) 100 MG tablet      Receiving Psychotherapy: No    RECOMMENDATIONS:  Greater than 50% of 30 min face to face time with patient was spent on counseling and coordination of care. We discussed his moderate improvement since initiating Lamictal. Discussed his medical problems making him feel more depressed. Difficulty in coping. Requesting small increase of Zoloft.    We agreed to:  To increase Zoloft to 200  mg daily To continue Trazodone 50 mg at bedtime To increase  Buspar  to 15 mg three times daily To to increase Lamictal to 100 mg beginning on 7/2.  To continue Ativan 0.5 mg once daily prn only for severe anxiety or agitation. To continue Wellbutrin 300 mg XL daily Will report worsening symptoms or side effects promptly To follow up in 8 weeks after surgery  to reassess Provided emergency contact information Discussed potential benefits, risk, and side effects of benzodiazepines to include potential risk of tolerance and dependence, as well as possible drowsiness.  Advised patient not to drive if experiencing drowsiness and to take lowest possible  effective dose to minimize risk of dependence and tolerance.  Monitor for any sign of rash. Please taking Lamictal and contact office immediately rash develops. Recommend seeking urgent medical attention if rash is severe and/or spreading quickly.  Reviewed PDMP  Joan Flores, NP

## 2022-10-27 ENCOUNTER — Ambulatory Visit: Payer: Medicare Other | Admitting: Neurology

## 2022-10-27 ENCOUNTER — Encounter: Payer: Self-pay | Admitting: Neurology

## 2022-10-27 VITALS — BP 132/75 | HR 102 | Ht 71.0 in | Wt 189.5 lb

## 2022-10-27 DIAGNOSIS — J84112 Idiopathic pulmonary fibrosis: Secondary | ICD-10-CM

## 2022-10-27 DIAGNOSIS — G629 Polyneuropathy, unspecified: Secondary | ICD-10-CM | POA: Diagnosis not present

## 2022-10-27 DIAGNOSIS — G3184 Mild cognitive impairment, so stated: Secondary | ICD-10-CM

## 2022-10-27 MED ORDER — PREGABALIN 150 MG PO CAPS: 150.0000 mg | ORAL_CAPSULE | Freq: Two times a day (BID) | ORAL | 5 refills | Status: DC

## 2022-10-27 NOTE — Progress Notes (Signed)
[facility]  PATIENT: Robert Lang DOB: 05-05-1950  REQUESTING CLINICIAN: [name], [name], [name] HISTORY FROM: Patient and spouse  REASON FOR VISIT: Recurrent syncope    HISTORICAL  CHIEF COMPLAINT:  Chief Complaint  Patient presents with   Follow-up    Rm12, wife present (connie) Peripheral polyneuropathy, Small fiber neuropathy, Mild cognitive impairment, IPF (idiopathic pulmonary fibrosis) : hospital 3 times in past month, needs gallbladder surgery looking laparoscopically in September, pt stated that the neuropathy bothers him when laying down at night and he gets up and sits in recliner then it fades away. He has started using cpap again, pt stated that he has tremors bilateral hands (ongoing for 10 or more years).       INTERVAL HISTORY 10/27/2022:  Patient presents today for follow-up for follow-up, he is accompanied by wife.  Last visit was in March at that time we restarted him on pregabalin 150 mg twice daily.  He reports with the pregabalin 150 mg twice daily his neuropathic pain is well-controlled.  Unfortunately in the past month he has been in and out of the hospital due to pulmonary embolism, gallbladder disease, which was infected, developed sepsis.  Now he has a drain.  He is also pending surgery scheduled in September.  He is currently using supplemental O2.  He has a follow-up set up with pulmonology soon.  Again in term of the neuropathy, he reports on occasion he will have additional burning pain but overall it is much better than previously.   INTERVAL HISTORY 06/15/2022:  Patient presents today for follow-up, last visit was in December.  At that time we started him on lidocaine cream since the Pregabalin had caused weight gain which affect his respiratory status.  He reports that lidocaine has not been helpful.  He is in extreme pain.  He does take Ambien at night for sleep and some night he has to double the dose because he cannot sleep due to  pain.  Again the pain is extreme worse on the left.  He reports that he has made some change to his diet, stop eating ice cream, decreasing his food intake and he has lost 10 to 15 pounds.  He would like to go back on the Lyrica since that is the only medication that has been helpful. He is not interested in a spinal stimulator for neuropathic pain.     INTERVAL HISTORY 03/01/2022:  Patient presents today for follow-up, at last visit we had planned to start him on pregabalin.  Initially he was doing well in terms of the neuropathic pain but has developed weight gain which in turn compromised his respiratory effort.  After discussion with his pulmonologist and with patient, we decided to discontinue Lyrica and start patient on amitriptyline.  He reports that amitriptyline is not helpful, it is not helpful with the neuropathy pain at night which keeps him from sleep. Last night, he had to take 4 Tylenol in order to control the pain and obtain some rest.    INTERVAL HISTORY 11/18/2021:  Patient presents today for follow-up, since last visit on 6/27, he denies any additional falls.  His current complaint right now is bilateral feet numbness, and pain.  He reports being diagnosed with neuropathy for many years but lately he has been experiencing burning pain in both feet.  The pain will keep him up at night.  He is on gabapentin 400 mg at nighttime but sometimes he would take extra dose in the middle of the  night if the pain wakes him up.  Denies any involvement of the legs, but reports at times he will have sharp shooting pain that starts from the left-sided back all the way down to the toes.   HISTORY OF PRESENT ILLNESS:  This is a 72 year old male with multiple medical conditions including hypertension, hyperlipidemia, atrial fibrillation, COPD, pulmonary fibrosis who is presenting after being admitted to the hospital after multiple syncopal episodes.  Patient reports in end of May he got tested positive  for COVID, he was having symptoms and was started on Plaxovid which he completed on June 4.  During this time while he was battling COVID, he did have generalized weakness, he actually had a fall and had difficulty getting of the floor, and on June 6 while being out with his wife had a syncopal episode while in the car.  Prior to the syncope he did complain of dizziness and feeling weak. He presented to [facility], admitted from the 6 to the 7 for syncope, initial work-up including MRI Brain was negative for any etiology of the syncope. On June 9 he presented again to [facility] for 2 additional syncope, prior to the episode he reported feeling dizzy.  In the ED he had 1 syncopal episode which was witnessed and there is report of left leg shaking with the syncope. There was no associated urinary incontinence, no tongue biting and no postictal fusion Again patient was admitted, work-up was unrevealing, he did have overnight EEG which was negative.  Again he was diagnosed with convulsive syncope likely due to dehydration and related to recent COVID infection. Since being discharged from the hospital, he reports feeling better, doing pretty good, he feels weak, feel drained but again no additional syncopal episode.  He is scheduled to see his pulmonologist on 5 July    OTHER MEDICAL CONDITIONS: Pulmonary fibrosis, depression/anxiety, hypertension, hyperlipidemia, atrial fibrillation, COPD, obstructive sleep apnea on CPAP   REVIEW OF SYSTEMS: Full 14 system review of systems performed and negative with exception of: as noted in the HPI   ALLERGIES: Allergies  Allergen Reactions   Naproxen Other (See Comments)    (Naprosyn *ANALGESICS - ANTI-INFLAMMATORY*) Nausea, Abdominal pain   Silodosin Other (See Comments)    Low blood pressure    HOME MEDICATIONS: Outpatient Medications Prior to Visit  Medication Sig Dispense Refill   acetaminophen (TYLENOL) 500 MG tablet Take 1 tablet (500 mg total)  by mouth every 8 (eight) hours as needed for moderate pain. 100 tablet 0   apixaban (ELIQUIS) 5 MG TABS tablet Take 1 tablet (5 mg total) by mouth 2 (two) times daily. 60 tablet 0   atorvastatin (LIPITOR) 80 MG tablet Take 1 tablet (80 mg total) by mouth daily. (Patient taking differently: Take 80 mg by mouth every evening.) 90 tablet 2   buPROPion (WELLBUTRIN XL) 300 MG 24 hr tablet Take 1 tablet (300 mg total) by mouth daily. 90 tablet 1   busPIRone (BUSPAR) 15 MG tablet Take 1 tablet (15 mg total) by mouth 3 (three) times daily. 90 tablet 3   Cholecalciferol (VITAMIN D3) 25 MCG (1000 UT) CAPS Take 2 capsules by mouth 2 (two) times daily.     docusate sodium (COLACE) 100 MG capsule Take 100 mg by mouth daily.     famotidine (PEPCID) 40 MG tablet TAKE 1 TABLET(40 MG) BY MOUTH AT BEDTIME (Patient taking differently: Take 40 mg by mouth at bedtime.) 90 tablet 2   fexofenadine (ALLEGRA) 180 MG tablet Take  180 mg by mouth at bedtime.      finasteride (PROSCAR) 5 MG tablet Take 1 tablet (5 mg total) by mouth daily. 90 tablet 1   lamoTRIgine (LAMICTAL) 100 MG tablet Take 1 tablet (100 mg total) by mouth daily. 30 tablet 1   levalbuterol (XOPENEX) 1.25 MG/0.5ML nebulizer solution Take 1.25 mg by nebulization every 4 (four) hours as needed for wheezing or shortness of breath. 1 each 12   levofloxacin (LEVAQUIN) 750 MG tablet Take 1 tablet (750 mg total) by mouth daily. 3 tablet 0   LORazepam (ATIVAN) 0.5 MG tablet Take one tablet by mouth only for severe anxiety or agitation (Patient taking differently: Take 0.5 mg by mouth daily as needed for anxiety. Take one tablet by mouth only for severe anxiety or agitation. Has not taking medication yet.) 30 tablet 1   Melatonin 10 MG TABS Take 1 tablet by mouth at bedtime.     Multiple Vitamins-Minerals (PRESERVISION AREDS PO) Take 1 capsule by mouth in the morning and at bedtime.     nitroGLYCERIN (NITROSTAT) 0.4 MG SL tablet Place 1 tablet (0.4 mg total) under the  tongue every 5 (five) minutes as needed for chest pain. 25 tablet 3   ondansetron (ZOFRAN) 4 MG tablet Take 1 tablet (4 mg total) by mouth every 8 (eight) hours as needed for nausea or vomiting. 20 tablet 0   oxyCODONE (OXY IR/ROXICODONE) 5 MG immediate release tablet Take 1 tablet (5 mg total) by mouth every 12 (twelve) hours as needed for severe pain. 6 tablet 0   pantoprazole (PROTONIX) 40 MG tablet TAKE 1 TABLET(40 MG) BY MOUTH DAILY (Patient taking differently: Take 40 mg by mouth daily.) 90 tablet 1   Pirfenidone (ESBRIET) 801 MG TABS Take 1 tablet (801 mg total) by mouth 3 (three) times daily. 270 tablet 1   Polyethylene Glycol 3350 (MIRALAX PO) Take 17 g by mouth daily.     ranolazine (RANEXA) 1000 MG SR tablet Take 1 tablet (1,000 mg total) by mouth 2 (two) times daily. 180 tablet 1   sertraline (ZOLOFT) 100 MG tablet Take 2 tablets (200 mg total) by mouth at bedtime. 180 tablet 1   Sodium Chloride Flush (NORMAL SALINE FLUSH) 0.9 % SOLN Flush drain with 5 mLs daily. Discard remaining 5ml in each syringe. 200 mL 0   traZODone (DESYREL) 50 MG tablet TAKE 1 TABLET(50 MG) BY MOUTH AT BEDTIME AS NEEDED FOR SLEEP. MAY TAKE 1/2 TABLET TO 1 TABLET FOR SLEEP, AS NEEDED (Patient taking differently: Take 50 mg by mouth at bedtime as needed for sleep.) 30 tablet 3   traZODone (DESYREL) 50 MG tablet Take 1 tablet (50 mg total) by mouth at bedtime. 90 tablet 1   umeclidinium-vilanterol (ANORO ELLIPTA) 62.5-25 MCG/ACT AEPB Inhale 1 puff into the lungs daily. 60 each 0   vitamin B-12 (CYANOCOBALAMIN) 1000 MCG tablet Take 1,000 mcg by mouth daily.     zolpidem (AMBIEN) 5 MG tablet Take 1 tablet (5 mg total) by mouth at bedtime as needed for sleep. 30 tablet 5   pregabalin (LYRICA) 150 MG capsule Take 1 capsule (150 mg total) by mouth 2 (two) times daily. 90 capsule 5   No facility-administered medications prior to visit.    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 M75.00)   Allergic rhinitis due to pollen 09/03/2013   J30.1)  Formatting of this note might be different from the original. J30.1)   Allergy    Anticoagulated 07/01/2016   Anxiety    Arthritis    Atrial fibrillation (HCC)    Atrial flutter (HCC) 06/26/2015   CAD (coronary artery disease)    Chest pain 06/26/2015   COPD (chronic obstructive pulmonary disease) (HCC)    Coronary artery disease 07/06/2018   Cardiac catheterization 2017 showing 90% small diagonal branch disease   DDD (degenerative disc disease), cervical 09/03/2013   M50.90)  Formatting of this note might be different from the original. M50.90)   Depression    Depression    Depressive disorder 09/03/2013   Dizziness 10/28/2017   Dyslipidemia, goal LDL below 70 07/06/2018   Dyspnea on exertion 10/20/2018   Esophageal reflux 09/03/2013   Essential hypertension 07/06/2018   ETOH abuse 01/11/2019   6 pack of beer per day   Falls 10/28/2017   GERD (gastroesophageal reflux disease)    H/O amiodarone therapy 07/01/2016   Heart attack (HCC)    Heart palpitations 01/27/2017   History of colon polyps    Hyperlipidemia    Hypertension    IPF (idiopathic pulmonary fibrosis) (HCC)    Kidney stones    Neck pain 09/03/2013   Neuropathy 07/06/2018   Obstructive sleep apnea 08/05/2015   PLMD (periodic limb movement disorder) 11/04/2015   Polycythemia, 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 STORY: Due to alcohol/ tobacco   Pure hypercholesterolemia 09/03/2013   E78.0)  Formatting of this note might be different from the original. E78.0)   Screening for prostate cancer 09/03/2013   Smoker 01/11/2019   Smoking 07/06/2018   Status post ablation of atrial  flutter 07/06/2018   2017    PAST SURGICAL HISTORY: Past Surgical History:  Procedure Laterality Date   ATRIAL FIBRILLATION ABLATION  10/2015   BACK SURGERY     CARDIOVERSION  2017   CATARACT EXTRACTION Bilateral 2017   March and April 2017   COLONOSCOPY  2018   CORONARY STENT PLACEMENT  2012   CORONARY/GRAFT ACUTE MI REVASCULARIZATION N/A 01/10/2019   Procedure: CORONARY/GRAFT ACUTE MI REVASCULARIZATION;  Surgeon: Marykay Lex, MD;  Location: [facility] INVASIVE CV LAB;  Service: Cardiovascular;  Laterality: N/A;   INGUINAL HERNIA REPAIR     over 20 years ago   IR CHOLANGIOGRAM EXISTING TUBE  09/16/2022   IR EXCHANGE BILIARY DRAIN  10/06/2022   IR PERC CHOLECYSTOSTOMY  08/30/2022   LAPAROSCOPIC APPENDECTOMY N/A 01/17/2021   Procedure: APPENDECTOMY LAPAROSCOPIC;  Surgeon: Quentin Ore, MD;  Location: [facility] OR;  Service: General;  Laterality: N/A;   LEFT HEART CATH AND CORONARY ANGIOGRAPHY N/A 01/10/2019   Procedure: LEFT HEART CATH AND CORONARY ANGIOGRAPHY;  Surgeon: Marykay Lex, MD;  Location: [facility] INVASIVE CV LAB;  Service: Cardiovascular;  Laterality: N/A;   LUMBAR LAMINECTOMY Bilateral 04/05/2016   L2-L5    VENTRAL HERNIA REPAIR  2018    FAMILY HISTORY: Family History  Problem Relation Age of Onset   Anxiety disorder Mother    Alcohol abuse Father    Colon cancer Father    Depression Brother    Alcohol abuse Brother    Throat cancer Brother     SOCIAL HISTORY: Social History   Socioeconomic History   Marital status: Married    Spouse name: Not on file  Number of children: Not on file   Years of education: Not on file   Highest education level: Not on file  Occupational History   Not on file  Tobacco Use   Smoking status: Former    Current packs/day: 0.00    Average packs/day: 1 pack/day for 30.0 years (30.0 ttl pk-yrs)    Types: Cigarettes    Start date: 01/09/1989    Quit date: 01/10/2019    Years since quitting: 3.7   Smokeless tobacco: Former   Building services engineer status: Never Used  Substance and Sexual Activity   Alcohol use: Yes    Comment: 8oz of wine a day   Drug use: Never   Sexual activity: Not on file  Other Topics Concern   Not on file  Social History Narrative   Not on file   Social Determinants of Health   Financial Resource Strain: Low Risk  (12/22/2021)   Overall Financial Resource Strain (CARDIA)    Difficulty of Paying Living Expenses: Not hard at all  Food Insecurity: No Food Insecurity (10/06/2022)   Hunger Vital Sign    Worried About Running Out of Food in the Last Year: Never true    Ran Out of Food in the Last Year: Never true  Transportation Needs: No Transportation Needs (10/06/2022)   PRAPARE - Administrator, Civil Service (Medical): No    Lack of Transportation (Non-Medical): No  Physical Activity: Inactive (12/22/2021)   Exercise Vital Sign    Days of Exercise per Week: 0 days    Minutes of Exercise per Session: 0 min  Stress: No Stress Concern Present (12/22/2021)   [employer] of Occupational Health - Occupational Stress Questionnaire    Feeling of Stress : Not at all  Social Connections: Moderately Integrated (12/22/2021)   Social Connection and Isolation Panel [NHANES]    Frequency of Communication with Friends and Family: More than three times a week    Frequency of Social Gatherings with Friends and Family: More than three times a week    Attends Religious Services: More than 4 times per year    Active Member of [employer] or Organizations: No    Attends Banker Meetings: Never    Marital Status: Married  Catering manager Violence: Not At Risk (10/06/2022)   Humiliation, Afraid, Rape, and Kick questionnaire    Fear of Current or Ex-Partner: No    Emotionally Abused: No    Physically Abused: No    Sexually Abused: No    PHYSICAL EXAM  GENERAL EXAM/CONSTITUTIONAL: Vitals:  Vitals:   10/27/22 1118  BP: 132/75  Pulse: (!) 102  Weight: 189 lb 8 oz (86 kg)   Height: 5\' 11"  (1.803 m)    Body mass index is 26.43 kg/m. Wt Readings from Last 3 Encounters:  10/27/22 189 lb 8 oz (86 kg)  10/15/22 199 lb 9.6 oz (90.5 kg)  10/13/22 203 lb 9.6 oz (92.4 kg)   Patient is in no distress; well developed, nourished and groomed; neck is supple, 2 L supplemental O2    MUSCULOSKELETAL: Gait, strength, tone, movements noted in Neurologic exam below  NEUROLOGIC: MENTAL STATUS:      No data to display         awake, alert, oriented to person, place and time recent and remote memory intact normal attention and concentration language fluent, comprehension intact, naming intact fund of knowledge appropriate  CRANIAL NERVE:  2nd, 3rd, 4th, 6th - visual fields full  to confrontation, extraocular muscles intact, no nystagmus 5th - facial sensation symmetric 7th - facial strength symmetric 8th - hearing intact 9th - palate elevates symmetrically, uvula midline 11th - shoulder shrug symmetric 12th - tongue protrusion midline  MOTOR:  normal bulk and tone, full strength in the BUE, BLE  SENSORY:  Decrease light touch, pinprick and vibration all the way to ankle bilaterally. He also has absent proprioception.   COORDINATION:  finger-nose-finger, fine finger movements normal   GAIT   Ambulates with a cane    DIAGNOSTIC DATA (LABS, IMAGING, TESTING) - I reviewed patient records, labs, notes, testing and imaging myself where available.  Lab Results  Component Value Date   WBC 7.0 10/15/2022   HGB 12.6 (L) 10/15/2022   HCT 39.5 10/15/2022   MCV 94.4 10/15/2022   PLT 397.0 10/15/2022      Component Value Date/Time   NA 139 10/15/2022 1139   NA 137 03/05/2020 1343   K 4.9 10/15/2022 1139   CL 103 10/15/2022 1139   CO2 28 10/15/2022 1139   GLUCOSE 87 10/15/2022 1139   BUN 10 10/15/2022 1139   BUN 14 03/05/2020 1343   CREATININE 0.84 10/15/2022 1139   CREATININE 0.92 09/02/2021 1541   CALCIUM 9.0 10/15/2022 1139   PROT 6.2 (L)  10/09/2022 0332   PROT 6.9 04/20/2019 0807   ALBUMIN 2.5 (L) 10/09/2022 0332   ALBUMIN 4.3 04/20/2019 0807   AST 15 10/09/2022 0332   ALT 16 10/09/2022 0332   ALKPHOS 49 10/09/2022 0332   BILITOT 0.4 10/09/2022 0332   BILITOT 0.5 04/20/2019 0807   GFRNONAA >60 10/09/2022 0332   GFRAA 81 03/05/2020 1343   Lab Results  Component Value Date   CHOL 144 12/30/2021   HDL 25.00 (L) 12/30/2021   LDLCALC 58 04/03/2020   LDLDIRECT 80.0 12/30/2021   TRIG 342.0 (H) 12/30/2021   CHOLHDL 6 12/30/2021   Lab Results  Component Value Date   HGBA1C 5.9 12/30/2021   Lab Results  Component Value Date   VITAMINB12 832 09/02/2021   Lab Results  Component Value Date   TSH 1.232 01/16/2021    Head CT 08/26/21:  1. No CT evidence for acute intracranial abnormality. 2. Atrophy and chronic small vessel ischemic changes of the white matter  MRI Brain 08/25/21 1. No acute intracranial abnormality.  2. Findings of chronic small vessel ischemia and generalized cerebral volume loss  Lumbar spine MRI 12/11/21 1.  At L2-L3, there is severe loss of disc height and other degenerative change causing moderately severe right lateral recess stenosis and moderate right foraminal narrowing.  There is potential for right L3 nerve root compression.  No spinal stenosis. 2.  At L3-L4, there is severe loss of disc height and other degenerative change causing moderately severe left lateral recess stenosis with potential for left L4 nerve root compression. 3.  At L4-L5, there are degenerative changes including severe facet hypertrophy causing mild spinal stenosis in the transverse diameter and moderately severe left foraminal and left lateral recess stenosis.  There is potential for left L4 or L5 nerve root compression. 4.  Milder degenerative changes at L1-L2 and L5-S1 do not lead to spinal stenosis or nerve root compression.   LTM EEG  This study is within normal limits. 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 year old male with multiple  medical conditions including hypertension, hyperlipidemia, atrial fibrillation, COPD, pulmonary fibrosis who is presenting for follow up for his neuropathic pain.  Doing well with Pregabalin. We will continue the patient on the same dose. Also advised him to take an extra dose if he is having severe pain. He voices understanding, follow up in 6 months. Continue to follow up with pulmonology and surgery    1. Peripheral polyneuropathy   2. Small fiber neuropathy   3. Mild cognitive impairment   4. IPF (idiopathic pulmonary fibrosis) (HCC)      Patient Instructions  Continue with Lyrica 150 mg twice daily, can take an extra one in the case of severe pain  Continue to follow up with your doctors  Return in 6 months or sooner if worse   No orders of the defined types were placed in this encounter.   Meds ordered this encounter  Medications   pregabalin (LYRICA) 150 MG capsule    Sig: Take 1 capsule (150 mg total) by mouth 2 (two) times daily.    Dispense:  90 capsule    Refill:  5    Return in about 6 months (around 04/29/2023).  I have spent a total of 30 minutes dedicated to this patient today, preparing to see patient, performing a medically appropriate examination and evaluation, ordering tests and/or medications and procedures, and counseling and educating the patient/family/caregiver; independently interpreting result and communicating results to the family/patient/caregiver; and documenting clinical information in the electronic medical record.   Windell Norfolk, MD 10/27/2022, 12:21 PM  [facility] [address], [address] [place], [place] [address] [phone]

## 2022-10-27 NOTE — Patient Instructions (Signed)
Continue with Lyrica 150 mg twice daily, can take an extra one in the case of severe pain  Continue to follow up with your doctors  Return in 6 months or sooner if worse

## 2022-10-28 ENCOUNTER — Telehealth: Payer: Self-pay | Admitting: Pharmacy Technician

## 2022-10-28 DIAGNOSIS — R6520 Severe sepsis without septic shock: Secondary | ICD-10-CM | POA: Diagnosis not present

## 2022-10-28 DIAGNOSIS — Z5986 Financial insecurity: Secondary | ICD-10-CM

## 2022-10-28 DIAGNOSIS — J9601 Acute respiratory failure with hypoxia: Secondary | ICD-10-CM | POA: Diagnosis not present

## 2022-10-28 DIAGNOSIS — A419 Sepsis, unspecified organism: Secondary | ICD-10-CM | POA: Diagnosis not present

## 2022-10-28 DIAGNOSIS — J690 Pneumonitis due to inhalation of food and vomit: Secondary | ICD-10-CM | POA: Diagnosis not present

## 2022-10-28 DIAGNOSIS — J441 Chronic obstructive pulmonary disease with (acute) exacerbation: Secondary | ICD-10-CM | POA: Diagnosis not present

## 2022-10-28 DIAGNOSIS — I2510 Atherosclerotic heart disease of native coronary artery without angina pectoris: Secondary | ICD-10-CM | POA: Diagnosis not present

## 2022-10-28 DIAGNOSIS — I2693 Single subsegmental pulmonary embolism without acute cor pulmonale: Secondary | ICD-10-CM | POA: Diagnosis not present

## 2022-10-28 DIAGNOSIS — I4892 Unspecified atrial flutter: Secondary | ICD-10-CM | POA: Diagnosis not present

## 2022-10-28 DIAGNOSIS — F411 Generalized anxiety disorder: Secondary | ICD-10-CM | POA: Diagnosis not present

## 2022-10-28 DIAGNOSIS — I330 Acute and subacute infective endocarditis: Secondary | ICD-10-CM | POA: Diagnosis not present

## 2022-10-28 DIAGNOSIS — J84112 Idiopathic pulmonary fibrosis: Secondary | ICD-10-CM | POA: Diagnosis not present

## 2022-10-28 DIAGNOSIS — J432 Centrilobular emphysema: Secondary | ICD-10-CM | POA: Diagnosis not present

## 2022-10-28 DIAGNOSIS — I480 Paroxysmal atrial fibrillation: Secondary | ICD-10-CM | POA: Diagnosis not present

## 2022-10-28 DIAGNOSIS — G629 Polyneuropathy, unspecified: Secondary | ICD-10-CM | POA: Diagnosis not present

## 2022-10-28 DIAGNOSIS — I452 Bifascicular block: Secondary | ICD-10-CM | POA: Diagnosis not present

## 2022-10-28 DIAGNOSIS — I252 Old myocardial infarction: Secondary | ICD-10-CM | POA: Diagnosis not present

## 2022-10-28 NOTE — Progress Notes (Signed)
[facility])                                            [facility] Quality Pharmacy Team    10/28/2022  Robert Lang [birth year] [id number]  Received patient and provider portion(s) of patient assistance application(s) for Eliquis. Faxed completed application and required documents into BMS.    Pattricia Boss, CPhT [place]  [facility] Office: [phone] Fax: [phone] Email: [email or web address]

## 2022-10-29 NOTE — Telephone Encounter (Signed)
There is a [facility] [employer] form they have to fill .  He can get it on google. Fill it and we sign it.

## 2022-11-01 NOTE — Telephone Encounter (Signed)
Left message advising patient to locate form, fill it out and get to office if necessary.

## 2022-11-02 ENCOUNTER — Telehealth: Payer: Self-pay | Admitting: Pharmacy Technician

## 2022-11-02 DIAGNOSIS — Z5986 Financial insecurity: Secondary | ICD-10-CM

## 2022-11-02 NOTE — Telephone Encounter (Signed)
Patient notified of results.

## 2022-11-02 NOTE — Progress Notes (Signed)
[facility])                                            [facility] Quality Pharmacy Team    11/02/2022  Robert Lang 1950/08/13 [id number]  Care coordination call placed to BMS in regard to Eliquis application.  Spoke to [place] who informs patient is APPROVED 11/01/2022-03/22/2023. Initial and subsequent shipments will be processed and shipped out automatically to the patient's home. If patient feels current supply is not sufficient to last until next fill date, then patient may call BMS at [phone].  Pattricia Boss, CPhT [name]  [facility] Office: [phone] Fax: [phone] Email: [email or web address]

## 2022-11-02 NOTE — Telephone Encounter (Signed)
Received fax from [facility] and patient has been approved to receive Eliquis free of charge from 11/01/22 until 03/22/23.

## 2022-11-04 ENCOUNTER — Ambulatory Visit: Payer: Medicare Other

## 2022-11-04 DIAGNOSIS — J84112 Idiopathic pulmonary fibrosis: Secondary | ICD-10-CM | POA: Diagnosis not present

## 2022-11-04 DIAGNOSIS — G629 Polyneuropathy, unspecified: Secondary | ICD-10-CM | POA: Diagnosis not present

## 2022-11-04 DIAGNOSIS — F411 Generalized anxiety disorder: Secondary | ICD-10-CM | POA: Diagnosis not present

## 2022-11-04 DIAGNOSIS — I2693 Single subsegmental pulmonary embolism without acute cor pulmonale: Secondary | ICD-10-CM | POA: Diagnosis not present

## 2022-11-04 DIAGNOSIS — R6520 Severe sepsis without septic shock: Secondary | ICD-10-CM | POA: Diagnosis not present

## 2022-11-04 DIAGNOSIS — I2510 Atherosclerotic heart disease of native coronary artery without angina pectoris: Secondary | ICD-10-CM | POA: Diagnosis not present

## 2022-11-04 DIAGNOSIS — J9601 Acute respiratory failure with hypoxia: Secondary | ICD-10-CM | POA: Diagnosis not present

## 2022-11-04 DIAGNOSIS — J432 Centrilobular emphysema: Secondary | ICD-10-CM | POA: Diagnosis not present

## 2022-11-04 DIAGNOSIS — I252 Old myocardial infarction: Secondary | ICD-10-CM | POA: Diagnosis not present

## 2022-11-04 DIAGNOSIS — I452 Bifascicular block: Secondary | ICD-10-CM | POA: Diagnosis not present

## 2022-11-04 DIAGNOSIS — A419 Sepsis, unspecified organism: Secondary | ICD-10-CM | POA: Diagnosis not present

## 2022-11-04 DIAGNOSIS — I480 Paroxysmal atrial fibrillation: Secondary | ICD-10-CM | POA: Diagnosis not present

## 2022-11-04 DIAGNOSIS — I330 Acute and subacute infective endocarditis: Secondary | ICD-10-CM | POA: Diagnosis not present

## 2022-11-04 DIAGNOSIS — J441 Chronic obstructive pulmonary disease with (acute) exacerbation: Secondary | ICD-10-CM | POA: Diagnosis not present

## 2022-11-04 DIAGNOSIS — J690 Pneumonitis due to inhalation of food and vomit: Secondary | ICD-10-CM | POA: Diagnosis not present

## 2022-11-04 DIAGNOSIS — I4892 Unspecified atrial flutter: Secondary | ICD-10-CM | POA: Diagnosis not present

## 2022-11-05 ENCOUNTER — Other Ambulatory Visit (HOSPITAL_COMMUNITY): Payer: Self-pay

## 2022-11-05 DIAGNOSIS — I480 Paroxysmal atrial fibrillation: Secondary | ICD-10-CM | POA: Diagnosis not present

## 2022-11-05 DIAGNOSIS — J690 Pneumonitis due to inhalation of food and vomit: Secondary | ICD-10-CM | POA: Diagnosis not present

## 2022-11-05 DIAGNOSIS — G629 Polyneuropathy, unspecified: Secondary | ICD-10-CM | POA: Diagnosis not present

## 2022-11-05 DIAGNOSIS — J9601 Acute respiratory failure with hypoxia: Secondary | ICD-10-CM | POA: Diagnosis not present

## 2022-11-05 DIAGNOSIS — I2693 Single subsegmental pulmonary embolism without acute cor pulmonale: Secondary | ICD-10-CM | POA: Diagnosis not present

## 2022-11-05 DIAGNOSIS — I252 Old myocardial infarction: Secondary | ICD-10-CM | POA: Diagnosis not present

## 2022-11-05 DIAGNOSIS — I2510 Atherosclerotic heart disease of native coronary artery without angina pectoris: Secondary | ICD-10-CM | POA: Diagnosis not present

## 2022-11-05 DIAGNOSIS — I330 Acute and subacute infective endocarditis: Secondary | ICD-10-CM | POA: Diagnosis not present

## 2022-11-05 DIAGNOSIS — R6520 Severe sepsis without septic shock: Secondary | ICD-10-CM | POA: Diagnosis not present

## 2022-11-05 DIAGNOSIS — J84112 Idiopathic pulmonary fibrosis: Secondary | ICD-10-CM | POA: Diagnosis not present

## 2022-11-05 DIAGNOSIS — I4892 Unspecified atrial flutter: Secondary | ICD-10-CM | POA: Diagnosis not present

## 2022-11-05 DIAGNOSIS — J432 Centrilobular emphysema: Secondary | ICD-10-CM | POA: Diagnosis not present

## 2022-11-05 DIAGNOSIS — A419 Sepsis, unspecified organism: Secondary | ICD-10-CM | POA: Diagnosis not present

## 2022-11-05 DIAGNOSIS — I452 Bifascicular block: Secondary | ICD-10-CM | POA: Diagnosis not present

## 2022-11-05 DIAGNOSIS — J441 Chronic obstructive pulmonary disease with (acute) exacerbation: Secondary | ICD-10-CM | POA: Diagnosis not present

## 2022-11-05 DIAGNOSIS — F411 Generalized anxiety disorder: Secondary | ICD-10-CM | POA: Diagnosis not present

## 2022-11-08 ENCOUNTER — Ambulatory Visit: Payer: Self-pay

## 2022-11-08 DIAGNOSIS — I252 Old myocardial infarction: Secondary | ICD-10-CM | POA: Diagnosis not present

## 2022-11-08 DIAGNOSIS — R6520 Severe sepsis without septic shock: Secondary | ICD-10-CM | POA: Diagnosis not present

## 2022-11-08 DIAGNOSIS — I2510 Atherosclerotic heart disease of native coronary artery without angina pectoris: Secondary | ICD-10-CM | POA: Diagnosis not present

## 2022-11-08 DIAGNOSIS — I480 Paroxysmal atrial fibrillation: Secondary | ICD-10-CM | POA: Diagnosis not present

## 2022-11-08 DIAGNOSIS — J84112 Idiopathic pulmonary fibrosis: Secondary | ICD-10-CM | POA: Diagnosis not present

## 2022-11-08 DIAGNOSIS — J432 Centrilobular emphysema: Secondary | ICD-10-CM | POA: Diagnosis not present

## 2022-11-08 DIAGNOSIS — I330 Acute and subacute infective endocarditis: Secondary | ICD-10-CM | POA: Diagnosis not present

## 2022-11-08 DIAGNOSIS — I4892 Unspecified atrial flutter: Secondary | ICD-10-CM | POA: Diagnosis not present

## 2022-11-08 DIAGNOSIS — G629 Polyneuropathy, unspecified: Secondary | ICD-10-CM | POA: Diagnosis not present

## 2022-11-08 DIAGNOSIS — A419 Sepsis, unspecified organism: Secondary | ICD-10-CM | POA: Diagnosis not present

## 2022-11-08 DIAGNOSIS — J690 Pneumonitis due to inhalation of food and vomit: Secondary | ICD-10-CM | POA: Diagnosis not present

## 2022-11-08 DIAGNOSIS — I452 Bifascicular block: Secondary | ICD-10-CM | POA: Diagnosis not present

## 2022-11-08 DIAGNOSIS — J441 Chronic obstructive pulmonary disease with (acute) exacerbation: Secondary | ICD-10-CM | POA: Diagnosis not present

## 2022-11-08 DIAGNOSIS — F411 Generalized anxiety disorder: Secondary | ICD-10-CM | POA: Diagnosis not present

## 2022-11-08 DIAGNOSIS — J9601 Acute respiratory failure with hypoxia: Secondary | ICD-10-CM | POA: Diagnosis not present

## 2022-11-08 DIAGNOSIS — I2693 Single subsegmental pulmonary embolism without acute cor pulmonale: Secondary | ICD-10-CM | POA: Diagnosis not present

## 2022-11-08 NOTE — Patient Instructions (Addendum)
Visit Information  Thank you for taking time to visit with me today. Please don't hesitate to contact me if I can be of assistance to you.   Following are the goals we discussed today:  Attend provider visits as scheduled Take medications as prescribed Contact provider with health questions or concerns as needed Particpate with home health therapist as recommended Maintain fall prevention strategies discussed  Our next appointment is by telephone on 12/20/22 at 3:00 pm  Please call the care guide team at [phone] if you need to cancel or reschedule your appointment.   If you are experiencing a Mental Health or Behavioral Health Crisis or need someone to talk to, please call the Suicide and Crisis Lifeline: 988 call the [place] National Suicide Prevention Lifeline: (323) 580-1073 or TTY: 310 302 4016 TTY (347)567-8334) to talk to a trained counselor call 1-800-273-TALK (toll free, 24 hour hotline)  Kathyrn Sheriff, RN, MSN, BSN, CCM [facility] Care Coordinator [phone]

## 2022-11-08 NOTE — Patient Outreach (Signed)
  Care Coordination   Follow Up Visit Note   11/08/2022 Name: Sasuke Rhyan MRN: [id number] DOB: 1950/04/05  Milburn Huseby is a 72 y.o. year old male who sees [name], [name], [place] for primary care. I spoke with  [name] by phone today.  What matters to the patients health and wellness today?  Mr. Nolazco reports he is doing ok. He states he is anxious to have gallbladder surgery. Continue to have drain. Active with home health [facility] for OT/Nurse/CNA. Continues to use oxygen as prescribed. Patient reports would like lighter portable oxygen tank and has an appointment with pulmonologist on 11/25/22 and will discuss with provider then.  Goals Addressed             This Visit's Progress    Continue to improve post hospitalization       Interventions Today    Flowsheet Row Most Recent Value  Chronic Disease   Chronic disease during today's visit Hypertension (HTN), Chronic Obstructive Pulmonary Disease (COPD), Other  [cholecystitis, idiopathic pulmonary fibrosis]  General Interventions   General Interventions Discussed/Reviewed General Interventions Reviewed, Doctor Visits, Durable Medical Equipment (DME)  Doctor Visits Discussed/Reviewed Doctor Visits Reviewed, PCP  Durable Medical Equipment (DME) Oxygen  PCP/Specialist Visits Compliance with follow-up visit  [reviewed upcoming appointments]  Exercise Interventions   Exercise Discussed/Reviewed Physical Activity  Physical Activity Discussed/Reviewed Physical Activity Reviewed  Education Interventions   Education Provided Provided Education  Provided Verbal Education On Other, Medication  [advised to continue to take medications as prescribed,  contact provider with health questions concerns prn,  regarding lighter portable oxygen tank-explained that he would have to be tested to see if eligible.]  Pharmacy Interventions   Pharmacy Dicussed/Reviewed Pharmacy Topics Reviewed  Safety Interventions   Safety  Discussed/Reviewed Fall Risk, Safety Reviewed  [continues to have home health OT, Nurse and CNA]            SDOH assessments and interventions completed:  No  Care Coordination Interventions:  Yes, provided   Follow up plan: Follow up call scheduled for 12/20/22    Encounter Outcome:  Pt. Visit Completed   Kathyrn Sheriff, RN, MSN, BSN, CCM [facility] Care Coordinator [phone]

## 2022-11-09 DIAGNOSIS — G629 Polyneuropathy, unspecified: Secondary | ICD-10-CM | POA: Diagnosis not present

## 2022-11-09 DIAGNOSIS — I452 Bifascicular block: Secondary | ICD-10-CM | POA: Diagnosis not present

## 2022-11-09 DIAGNOSIS — I480 Paroxysmal atrial fibrillation: Secondary | ICD-10-CM | POA: Diagnosis not present

## 2022-11-09 DIAGNOSIS — J84112 Idiopathic pulmonary fibrosis: Secondary | ICD-10-CM | POA: Diagnosis not present

## 2022-11-09 DIAGNOSIS — J9601 Acute respiratory failure with hypoxia: Secondary | ICD-10-CM | POA: Diagnosis not present

## 2022-11-09 DIAGNOSIS — F411 Generalized anxiety disorder: Secondary | ICD-10-CM | POA: Diagnosis not present

## 2022-11-09 DIAGNOSIS — I2510 Atherosclerotic heart disease of native coronary artery without angina pectoris: Secondary | ICD-10-CM | POA: Diagnosis not present

## 2022-11-09 DIAGNOSIS — I10 Essential (primary) hypertension: Secondary | ICD-10-CM | POA: Diagnosis not present

## 2022-11-09 DIAGNOSIS — I4892 Unspecified atrial flutter: Secondary | ICD-10-CM | POA: Diagnosis not present

## 2022-11-09 DIAGNOSIS — I2693 Single subsegmental pulmonary embolism without acute cor pulmonale: Secondary | ICD-10-CM | POA: Diagnosis not present

## 2022-11-09 DIAGNOSIS — J441 Chronic obstructive pulmonary disease with (acute) exacerbation: Secondary | ICD-10-CM | POA: Diagnosis not present

## 2022-11-09 DIAGNOSIS — I330 Acute and subacute infective endocarditis: Secondary | ICD-10-CM | POA: Diagnosis not present

## 2022-11-09 DIAGNOSIS — A419 Sepsis, unspecified organism: Secondary | ICD-10-CM | POA: Diagnosis not present

## 2022-11-09 DIAGNOSIS — Z86711 Personal history of pulmonary embolism: Secondary | ICD-10-CM | POA: Diagnosis not present

## 2022-11-09 DIAGNOSIS — J432 Centrilobular emphysema: Secondary | ICD-10-CM | POA: Diagnosis not present

## 2022-11-09 DIAGNOSIS — J690 Pneumonitis due to inhalation of food and vomit: Secondary | ICD-10-CM | POA: Diagnosis not present

## 2022-11-09 DIAGNOSIS — I252 Old myocardial infarction: Secondary | ICD-10-CM | POA: Diagnosis not present

## 2022-11-09 DIAGNOSIS — R6520 Severe sepsis without septic shock: Secondary | ICD-10-CM | POA: Diagnosis not present

## 2022-11-11 DIAGNOSIS — N400 Enlarged prostate without lower urinary tract symptoms: Secondary | ICD-10-CM | POA: Diagnosis not present

## 2022-11-11 DIAGNOSIS — F32A Depression, unspecified: Secondary | ICD-10-CM | POA: Diagnosis not present

## 2022-11-11 DIAGNOSIS — G4733 Obstructive sleep apnea (adult) (pediatric): Secondary | ICD-10-CM | POA: Diagnosis not present

## 2022-11-11 DIAGNOSIS — I10 Essential (primary) hypertension: Secondary | ICD-10-CM | POA: Diagnosis not present

## 2022-11-11 DIAGNOSIS — K219 Gastro-esophageal reflux disease without esophagitis: Secondary | ICD-10-CM | POA: Diagnosis not present

## 2022-11-11 DIAGNOSIS — J301 Allergic rhinitis due to pollen: Secondary | ICD-10-CM | POA: Diagnosis not present

## 2022-11-11 DIAGNOSIS — J84112 Idiopathic pulmonary fibrosis: Secondary | ICD-10-CM | POA: Diagnosis not present

## 2022-11-11 DIAGNOSIS — E7800 Pure hypercholesterolemia, unspecified: Secondary | ICD-10-CM | POA: Diagnosis not present

## 2022-11-11 DIAGNOSIS — J432 Centrilobular emphysema: Secondary | ICD-10-CM | POA: Diagnosis not present

## 2022-11-11 DIAGNOSIS — I2510 Atherosclerotic heart disease of native coronary artery without angina pectoris: Secondary | ICD-10-CM | POA: Diagnosis not present

## 2022-11-11 DIAGNOSIS — Z9981 Dependence on supplemental oxygen: Secondary | ICD-10-CM | POA: Diagnosis not present

## 2022-11-11 DIAGNOSIS — J9601 Acute respiratory failure with hypoxia: Secondary | ICD-10-CM | POA: Diagnosis not present

## 2022-11-11 DIAGNOSIS — I330 Acute and subacute infective endocarditis: Secondary | ICD-10-CM | POA: Diagnosis not present

## 2022-11-11 DIAGNOSIS — J690 Pneumonitis due to inhalation of food and vomit: Secondary | ICD-10-CM | POA: Diagnosis not present

## 2022-11-11 DIAGNOSIS — G4761 Periodic limb movement disorder: Secondary | ICD-10-CM | POA: Diagnosis not present

## 2022-11-11 DIAGNOSIS — I2693 Single subsegmental pulmonary embolism without acute cor pulmonale: Secondary | ICD-10-CM | POA: Diagnosis not present

## 2022-11-11 DIAGNOSIS — I4892 Unspecified atrial flutter: Secondary | ICD-10-CM | POA: Diagnosis not present

## 2022-11-11 DIAGNOSIS — Z7901 Long term (current) use of anticoagulants: Secondary | ICD-10-CM | POA: Diagnosis not present

## 2022-11-11 DIAGNOSIS — G629 Polyneuropathy, unspecified: Secondary | ICD-10-CM | POA: Diagnosis not present

## 2022-11-11 DIAGNOSIS — F1010 Alcohol abuse, uncomplicated: Secondary | ICD-10-CM | POA: Diagnosis not present

## 2022-11-11 DIAGNOSIS — F411 Generalized anxiety disorder: Secondary | ICD-10-CM | POA: Diagnosis not present

## 2022-11-11 DIAGNOSIS — M5030 Other cervical disc degeneration, unspecified cervical region: Secondary | ICD-10-CM | POA: Diagnosis not present

## 2022-11-11 DIAGNOSIS — I452 Bifascicular block: Secondary | ICD-10-CM | POA: Diagnosis not present

## 2022-11-11 DIAGNOSIS — I252 Old myocardial infarction: Secondary | ICD-10-CM | POA: Diagnosis not present

## 2022-11-11 DIAGNOSIS — M1990 Unspecified osteoarthritis, unspecified site: Secondary | ICD-10-CM | POA: Diagnosis not present

## 2022-11-16 DIAGNOSIS — G629 Polyneuropathy, unspecified: Secondary | ICD-10-CM | POA: Diagnosis not present

## 2022-11-16 DIAGNOSIS — I252 Old myocardial infarction: Secondary | ICD-10-CM | POA: Diagnosis not present

## 2022-11-16 DIAGNOSIS — I330 Acute and subacute infective endocarditis: Secondary | ICD-10-CM | POA: Diagnosis not present

## 2022-11-16 DIAGNOSIS — I452 Bifascicular block: Secondary | ICD-10-CM | POA: Diagnosis not present

## 2022-11-16 DIAGNOSIS — I10 Essential (primary) hypertension: Secondary | ICD-10-CM | POA: Diagnosis not present

## 2022-11-16 DIAGNOSIS — J690 Pneumonitis due to inhalation of food and vomit: Secondary | ICD-10-CM | POA: Diagnosis not present

## 2022-11-16 DIAGNOSIS — I2693 Single subsegmental pulmonary embolism without acute cor pulmonale: Secondary | ICD-10-CM | POA: Diagnosis not present

## 2022-11-16 DIAGNOSIS — F411 Generalized anxiety disorder: Secondary | ICD-10-CM | POA: Diagnosis not present

## 2022-11-16 DIAGNOSIS — J432 Centrilobular emphysema: Secondary | ICD-10-CM | POA: Diagnosis not present

## 2022-11-16 DIAGNOSIS — F1010 Alcohol abuse, uncomplicated: Secondary | ICD-10-CM | POA: Diagnosis not present

## 2022-11-16 DIAGNOSIS — G4733 Obstructive sleep apnea (adult) (pediatric): Secondary | ICD-10-CM | POA: Diagnosis not present

## 2022-11-16 DIAGNOSIS — I4892 Unspecified atrial flutter: Secondary | ICD-10-CM | POA: Diagnosis not present

## 2022-11-16 DIAGNOSIS — J9601 Acute respiratory failure with hypoxia: Secondary | ICD-10-CM | POA: Diagnosis not present

## 2022-11-16 DIAGNOSIS — I2510 Atherosclerotic heart disease of native coronary artery without angina pectoris: Secondary | ICD-10-CM | POA: Diagnosis not present

## 2022-11-16 DIAGNOSIS — J84112 Idiopathic pulmonary fibrosis: Secondary | ICD-10-CM | POA: Diagnosis not present

## 2022-11-16 DIAGNOSIS — E7800 Pure hypercholesterolemia, unspecified: Secondary | ICD-10-CM | POA: Diagnosis not present

## 2022-11-17 ENCOUNTER — Ambulatory Visit: Payer: Medicare Other | Attending: Cardiology

## 2022-11-17 DIAGNOSIS — R0609 Other forms of dyspnea: Secondary | ICD-10-CM

## 2022-11-17 LAB — ECHOCARDIOGRAM COMPLETE: S' Lateral: 3.7 cm

## 2022-11-18 ENCOUNTER — Other Ambulatory Visit: Payer: Self-pay | Admitting: Medical

## 2022-11-18 DIAGNOSIS — G629 Polyneuropathy, unspecified: Secondary | ICD-10-CM | POA: Diagnosis not present

## 2022-11-18 DIAGNOSIS — I452 Bifascicular block: Secondary | ICD-10-CM | POA: Diagnosis not present

## 2022-11-18 DIAGNOSIS — I330 Acute and subacute infective endocarditis: Secondary | ICD-10-CM | POA: Diagnosis not present

## 2022-11-18 DIAGNOSIS — J690 Pneumonitis due to inhalation of food and vomit: Secondary | ICD-10-CM | POA: Diagnosis not present

## 2022-11-18 DIAGNOSIS — I4892 Unspecified atrial flutter: Secondary | ICD-10-CM | POA: Diagnosis not present

## 2022-11-18 DIAGNOSIS — J84112 Idiopathic pulmonary fibrosis: Secondary | ICD-10-CM | POA: Diagnosis not present

## 2022-11-18 DIAGNOSIS — F411 Generalized anxiety disorder: Secondary | ICD-10-CM | POA: Diagnosis not present

## 2022-11-18 DIAGNOSIS — E7800 Pure hypercholesterolemia, unspecified: Secondary | ICD-10-CM | POA: Diagnosis not present

## 2022-11-18 DIAGNOSIS — I2510 Atherosclerotic heart disease of native coronary artery without angina pectoris: Secondary | ICD-10-CM | POA: Diagnosis not present

## 2022-11-18 DIAGNOSIS — I252 Old myocardial infarction: Secondary | ICD-10-CM | POA: Diagnosis not present

## 2022-11-18 DIAGNOSIS — G4733 Obstructive sleep apnea (adult) (pediatric): Secondary | ICD-10-CM | POA: Diagnosis not present

## 2022-11-18 DIAGNOSIS — I2693 Single subsegmental pulmonary embolism without acute cor pulmonale: Secondary | ICD-10-CM | POA: Diagnosis not present

## 2022-11-18 DIAGNOSIS — J9601 Acute respiratory failure with hypoxia: Secondary | ICD-10-CM | POA: Diagnosis not present

## 2022-11-18 DIAGNOSIS — I10 Essential (primary) hypertension: Secondary | ICD-10-CM | POA: Diagnosis not present

## 2022-11-18 DIAGNOSIS — F1010 Alcohol abuse, uncomplicated: Secondary | ICD-10-CM | POA: Diagnosis not present

## 2022-11-18 DIAGNOSIS — J432 Centrilobular emphysema: Secondary | ICD-10-CM | POA: Diagnosis not present

## 2022-11-24 ENCOUNTER — Telehealth: Payer: Self-pay

## 2022-11-24 DIAGNOSIS — J84112 Idiopathic pulmonary fibrosis: Secondary | ICD-10-CM | POA: Diagnosis not present

## 2022-11-24 DIAGNOSIS — I330 Acute and subacute infective endocarditis: Secondary | ICD-10-CM | POA: Diagnosis not present

## 2022-11-24 DIAGNOSIS — J432 Centrilobular emphysema: Secondary | ICD-10-CM | POA: Diagnosis not present

## 2022-11-24 DIAGNOSIS — I2693 Single subsegmental pulmonary embolism without acute cor pulmonale: Secondary | ICD-10-CM | POA: Diagnosis not present

## 2022-11-24 DIAGNOSIS — J690 Pneumonitis due to inhalation of food and vomit: Secondary | ICD-10-CM | POA: Diagnosis not present

## 2022-11-24 DIAGNOSIS — E7800 Pure hypercholesterolemia, unspecified: Secondary | ICD-10-CM | POA: Diagnosis not present

## 2022-11-24 DIAGNOSIS — I4892 Unspecified atrial flutter: Secondary | ICD-10-CM | POA: Diagnosis not present

## 2022-11-24 DIAGNOSIS — I452 Bifascicular block: Secondary | ICD-10-CM | POA: Diagnosis not present

## 2022-11-24 DIAGNOSIS — I2510 Atherosclerotic heart disease of native coronary artery without angina pectoris: Secondary | ICD-10-CM | POA: Diagnosis not present

## 2022-11-24 DIAGNOSIS — F411 Generalized anxiety disorder: Secondary | ICD-10-CM | POA: Diagnosis not present

## 2022-11-24 DIAGNOSIS — I10 Essential (primary) hypertension: Secondary | ICD-10-CM | POA: Diagnosis not present

## 2022-11-24 DIAGNOSIS — J9601 Acute respiratory failure with hypoxia: Secondary | ICD-10-CM | POA: Diagnosis not present

## 2022-11-24 DIAGNOSIS — F1010 Alcohol abuse, uncomplicated: Secondary | ICD-10-CM | POA: Diagnosis not present

## 2022-11-24 DIAGNOSIS — G4733 Obstructive sleep apnea (adult) (pediatric): Secondary | ICD-10-CM | POA: Diagnosis not present

## 2022-11-24 DIAGNOSIS — G629 Polyneuropathy, unspecified: Secondary | ICD-10-CM | POA: Diagnosis not present

## 2022-11-24 DIAGNOSIS — I252 Old myocardial infarction: Secondary | ICD-10-CM | POA: Diagnosis not present

## 2022-11-24 NOTE — Telephone Encounter (Signed)
-----   Message from Gypsy Balsam sent at 11/18/2022 10:07 AM EDT ----- Echocardiogram showed preserved left ventricle ejection fraction, mild left ventricle hypertrophy mild mitral valve regurgitation

## 2022-11-24 NOTE — Telephone Encounter (Signed)
Patient notified through my chart.

## 2022-11-25 ENCOUNTER — Ambulatory Visit: Payer: Medicare Other | Admitting: Internal Medicine

## 2022-11-25 ENCOUNTER — Ambulatory Visit (INDEPENDENT_AMBULATORY_CARE_PROVIDER_SITE_OTHER): Payer: Medicare Other | Admitting: Internal Medicine

## 2022-11-25 ENCOUNTER — Encounter: Payer: Self-pay | Admitting: Internal Medicine

## 2022-11-25 VITALS — BP 122/74 | HR 101 | Temp 97.2°F | Ht 71.0 in | Wt 191.0 lb

## 2022-11-25 DIAGNOSIS — Z5181 Encounter for therapeutic drug level monitoring: Secondary | ICD-10-CM | POA: Diagnosis not present

## 2022-11-25 DIAGNOSIS — Z86711 Personal history of pulmonary embolism: Secondary | ICD-10-CM

## 2022-11-25 DIAGNOSIS — J84112 Idiopathic pulmonary fibrosis: Secondary | ICD-10-CM

## 2022-11-25 DIAGNOSIS — R0902 Hypoxemia: Secondary | ICD-10-CM

## 2022-11-25 DIAGNOSIS — Z8719 Personal history of other diseases of the digestive system: Secondary | ICD-10-CM | POA: Diagnosis not present

## 2022-11-25 LAB — PULMONARY FUNCTION TEST
DL/VA % pred: 82 %
DL/VA: 3.29 ml/min/mmHg/L
DLCO cor % pred: 71 %
DLCO cor: 18.88 ml/min/mmHg
DLCO unc % pred: 67 %
DLCO unc: 17.72 ml/min/mmHg
FEF 25-75 Pre: 1.81 L/s
FEF2575-%Pred-Pre: 73 %
FEV1-%Pred-Pre: 70 %
FEV1-Pre: 2.34 L
FEV1FVC-%Pred-Pre: 103 %
FEV6-%Pred-Pre: 71 %
FEV6-Pre: 3.06 L
FEV6FVC-%Pred-Pre: 105 %
FVC-%Pred-Pre: 68 %
FVC-Pre: 3.08 L
Pre FEV1/FVC ratio: 76 %
Pre FEV6/FVC Ratio: 99 %

## 2022-11-25 LAB — BASIC METABOLIC PANEL
BUN: 14 mg/dL (ref 6–23)
CO2: 27 meq/L (ref 19–32)
Calcium: 9.4 mg/dL (ref 8.4–10.5)
Chloride: 102 meq/L (ref 96–112)
Creatinine, Ser: 0.98 mg/dL (ref 0.40–1.50)
GFR: 76.95 mL/min (ref >60.00)
Glucose, Bld: 114 mg/dL — ABNORMAL HIGH (ref 70–99)
Potassium: 4.6 meq/L (ref 3.5–5.1)
Sodium: 135 meq/L (ref 135–145)

## 2022-11-25 LAB — CBC WITH DIFFERENTIAL/PLATELET
Basophils Absolute: 0.1 10*3/uL (ref 0.0–0.1)
Basophils Relative: 0.7 % (ref 0.0–3.0)
Eosinophils Absolute: 0.2 10*3/uL (ref 0.0–0.7)
Eosinophils Relative: 1.8 % (ref 0.0–5.0)
HCT: 45.5 % (ref 39.0–52.0)
Hemoglobin: 14.7 g/dL (ref 13.0–17.0)
Lymphocytes Relative: 25.9 % (ref 12.0–46.0)
Lymphs Abs: 2.1 10*3/uL (ref 0.7–4.0)
MCHC: 32.3 g/dL (ref 30.0–36.0)
MCV: 94.1 fl (ref 78.0–100.0)
Monocytes Absolute: 1 10*3/uL (ref 0.1–1.0)
Monocytes Relative: 11.5 % (ref 3.0–12.0)
Neutro Abs: 5 10*3/uL (ref 1.4–7.7)
Neutrophils Relative %: 60.1 % (ref 43.0–77.0)
Platelets: 267 10*3/uL (ref 150.0–400.0)
RBC: 4.83 Mil/uL (ref 4.22–5.81)
RDW: 15.7 % — ABNORMAL HIGH (ref 11.5–15.5)
WBC: 8.3 10*3/uL (ref 4.0–10.5)

## 2022-11-25 LAB — D-DIMER, QUANTITATIVE: D-Dimer, Quant: 0.74 ug{FEU}/mL — ABNORMAL HIGH (ref <0.50)

## 2022-11-25 LAB — BRAIN NATRIURETIC PEPTIDE: Pro B Natriuretic peptide (BNP): 36 pg/mL (ref 0.0–100.0)

## 2022-11-25 NOTE — Patient Instructions (Signed)
Spiro/DLCO performed today. 

## 2022-11-25 NOTE — Progress Notes (Signed)
Spiro/DLCO performed today. 

## 2022-11-25 NOTE — Patient Instructions (Addendum)
IPF (idiopathic pulmonary fibrosis) (HCC) Medication monitoring encounter  -Pulmonary fibrosis itself appears to be stable since OCt 2023/April 2023 based on pulmonary function test and symptoms but declined since dec 2023  - Mild exercise hypoxemia 11/25/2022 reflect post hospitalization versus mild gradual chronic decline in IPF   Overall tolerating Esbriet well.    LFT normal July 2024  plan - continue esbriet per schedule -check LFT 11/25/2022 - continue night o2  - CMA to do order for portable o2 11/25/2022 with [employer]  Pulmonary embolism small June 2024 requirng o2 in hospital  - on 11/25/2022  - using o2 at night and only very mild exercise hypxoemia  Plan  -cotninue eliquis; duration likely 1+ years - check d-dimer, bnp, cbc, bmet 11/25/2022   Acute cholecystisis s/p drain  - sil with drain  Plan   - if blood work ok, now that 3 months since blood clot ok to have cholecystectomy  - let surgeon know  -Follow-up - 3 months Dr Marchelle Gearing; symptom score and walk at followup  -30-minute visit

## 2022-11-25 NOTE — Addendum Note (Signed)
Addended by: Lanna Poche on: 11/25/2022 03:28 PM   Modules accepted: Orders

## 2022-11-25 NOTE — Telephone Encounter (Signed)
OV notes and clearance form have been faxed back to [facility]. Nothing further needed at this time.  

## 2022-11-25 NOTE — Progress Notes (Signed)
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 PCP [name], [name] Patient Care Team: [name], [name] as PCP - General (Internal Medicine) Georgeanna Lea, MD as PCP - Cardiology (Cardiology)  This Provider for this visit: Treatment Team:  Attending Provider: Kalman Shan, MD    09/16/2022 -   Chief Complaint  Patient presents with   Hospitalization Follow-up    Blood clot in lung, not feeling good, Gallbladder pain.  HPI Robert Lang 72 y.o. -returns for post hospital follow-up.  This is an unscheduled visit otherwise.  His pulm fibrosis itself is stable.  He presents with his wife.  Wife is an independent historian.  History is also gained from review of the medical records especially external medical record related to hospitalization 08/28/2022 - 09/01/2022.  He  had dual problems of subsegmental pulmonary embolism and also acute cholecystitis.  Discharged on Eliquis.  Also status post gallbladder drain.  He was on oxygen but he is now off oxygen.  He is getting home physical therapy.  He is upset about his illness but he feels he is gaining strength.  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  He is tolerating high-dose prescription of pirfenidone that requires intensive therapeutic monitoring well.  He had blood work 09/13/2022.  Liver function test was normal.  Hemoglobin and kidney function are normal.  Also visualized the CT scan of the chest from August 29, 2022: Agree with the pulmonary embolism and I also feel that the pulmonary fibrosis itself is stable.  With my personal independent of interpretation.    Other issues - She did see psychiatry on 09/14/2022.  His BuSpar was increased.  Other medications noted above but continued.   10/13/2022- Interim hx  Patient presents today for hospital follow-up.  He was admitted from 10/05/2022 - 10/09/2022 for sepsis secondary to Klebsiella bacteremia.  Originally presented with abdominal pain and decreased cholecystostomy tube output.  He was treated with IV Rocephin and Flagyl, transition to oral Levaquin on 10/08/2022.  Prior to this admission he had been treated for cholecystitis for close to 5 weeks, underwent cholecystostomy drain placement first week of June.  Drain repositioned by IR on 10/06/2022.  Patient was cleared by IR and general surgery for discharge.  Will eventually need Cholecystectomy with Dr. Feliciana Rossetti with [facility] general surgery after clearance with his primary cardiologist and pulmonologist. Tentative date is mid September.   Patient follows with Dr. Marchelle Gearing for history of IPF and COPD. Maintained on LABA/LAMA, pirfenidone and CPAP at bedtime. He was seen by Dr. Gwenyth Bender in June and IPF felt to be stable since December  2022 based on PFTs and symptoms. Tolerating Esbriet. Patient reports that his O2 was fluctuating prior to hospitalization. He was discharged on 2L which has been approved through 2027. He has been wearing oxygen at night but not CPAP. He needs adaptor to connect oxygen to CPAP. Spirometry is scheduled for Sept 5th. He is on Eliquis for history of paroxysmal A-fib and history of PE.   Airview download 09/12/2022 - 10/11/2022 Usage days 20/30 days (67%); 11 days (37%) greater than 4 hours Average usage days used 4 hours 19 minutes Pressure 5 to 20 cm H2O (9.4 cm H2O 95%) Air leaks 18.8 L/min (95%) AHI 1.0  OV 11/25/2022  Subjective:  Patient ID: Robert Lang, male , DOB: [birth year] , age 9 y.o. , MRN: [id number] , ADDRESS: [address] 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  2 antihypertensives had to be stopped -Stable high-resolution CT chest April 2023 compared to February 2022  High risk prescription of pirfenidone  - Esbriet/Pirfenidone requires intensive drug monitoring due to high concerns for Adverse effects of , including  Drug Induced Liver Injury, significant GI side effects that include but not limited to Diarrhea, Nausea, Vomiting,  and other system side effects that include Fatigue, headaches, weight loss and other side effects such as skin rash. These will be monitored with  blood  work such as LFT initially once a month for 6 months and then quarterly    Mild associated emphysema  -Noticed in April 2023 high-resolution CT chest and started on Stiolto  Atrial flutter status post ablation 2017  -On Xarelto  Grade 1 diastolic dysfunction  -Echocardiogram Sept 2023 (no PASP)   Coronary artery disease  -On GDMT treatment  OSA CPAP pending end of 2022   Mild gait unsteadiness due to spinal issues     - uses cane  Long-term turmeric use  Lung nodules  - feb 2022:L Three scattered solid basilar left lower lobe pulmonary nodules, largest 7 mm posteromedially.   -CT chest April 2023 describes this as 4 mm subpleural nodules.   History of COVID-19 Memorial Day weekend 2023   Admission early June 2023 with a syncope  -  Antviral Paxlovid considered as an etiology and dehydrtion  -MRI negative, EEG unremarkable  EKG with bifascicular block  -Cleared by neurology September 15, 2021 outpatient  -ZIO monitor placed June 2023 wounds.\  - QtcF 04/20/2022  Prolonged Qtc >  - QTcF - 04/20/2022  - QTc 478 msec on EKG June 2024 personally visualzied and itnerpreted  - Qtc 463 mosec Lang 2024 -    Admission 08/28/2022 - 09/01/2022  -Small subsegmental pulmonary embolism treatment with anticoagulation  -Acute cholecystitis status post cholecystostomy  was admitted from 10/05/2022 - 10/09/2022 for sepsis secondary to Klebsiella bacteremia.   Depressive disorder, generalized anxiety disorder and insomnia  -Under the care of Avelina Laine nurse practitioner psychiatry  -On Zoloft, trazodone, BuSpar, Lamictal, lorazepam, Wellbutrin   11/25/2022 -   Chief Complaint  Patient presents with   Follow-up    PFT 11/25/22 SOB      HPI Robert Lang 72 y.o. -returns for follow-up.  Last seen in June 2024.  Wife is here and is an independent historian.  He is still dealing with his gallbladder issues.  His gallbladder drain is still there.  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  Since then he states he been hospitalized couple of times most recently Lang 2024 for sepsis related to his gallbladder.  Currently he is at home and doing physical therapy and reconditioning quite well.  He is now on oxygen therapy at night and in the daytime.  He wants portable oxygen.  He feels his oxygen is helping him.  His pulmonary function test appears to be stable in the last year but progressed compared to a year and a half ago.  We did a sit/stand hypoxemia test and his pulse ox is normal on room air at rest but he does desaturate very transiently to 88% at the end of exertion.  There is a huge improvement though.  He wants a preop clearance and so to see.  I did tell him that is now 3 months since he had his blood clot and given the improvement in hypoxemia it may not be a bad time to do a cholecystectomy although he is at risk for recurrence of blood clot in the immediate days after surgery.  He understands this but he wants his drain out.  He did agree to get a BNP checked.  Recently in Lang 2012 is high and a D-dimer and I will do some restratification.Marland Kitchen  He does want portable oxygen..                   SYMPTOM SCALE - ILD 06/17/2020   07/28/2020   09/09/2020 184# 11/18/2020 185#  Esbriet 801mg  BID 12/19/2020   Esbriet 801mg  TID  02/19/2021 187# 06/23/2021 180# 07/14/2021 Esbriet 801mg  TID  09/24/2021 188# - esbreit and also stiolto 01/07/2022 206# 04/20/2022 200# esbriet 11/25/2022 191# - esbriet  O2 use ra RA     RA   ra0 RA  ra ra ra ra  Shortness of Breath 0 -> 5 scale with 5 being worst (score 6 If unable to do) 0-5              At rest 3 0.5 2 4 3   0 3 2 3 3 3   Simple tasks - showers, clothes change, eating, shaving 5 3 3 3 2  3 3 4 4 2 4   Household (dishes, doing bed, laundry) 5 3 (  he does not do these tasks) 5 0 3 (he does not do these tasks)  0 NA 4 5 2 4   Shopping 5 3 (he does not do this often) 3 1 3  (he  does not do these tasks)  4 3.5 (uses scooter) 4 5 2 3   Walking level at own pace 4 3 3 1 3  5 3 3 3 2 3   Walking up Stairs 5 5 4 1 3  5  3.5 4 5 2 4   Total (30-36) Dyspnea Score 27 17.5 20 10 17  17 16 21 25 13 21   How bad is your cough? 3 2 1  fair 1  0 1 2 2 2 3   How bad is your fatigue 4 2 4  fair 3  3 4 3 5 2 4   How bad is nausea 0 0 3 good 2  3 2 2 2  0 3  How bad is vomiting?   0 0 0 good 0  0 0 0 0 0 0  How bad is diarrhea? 3 0 0 good 0  0 0 1 2 0 0  How bad is anxiety? 5 5 3  fair 2  5 5 3 5 3 4   How bad is depression 5 5 3  faor 3  3 4 3 4 3 4          Simple office walk 185 feet x  3 laps goal with forehead probe 06/23/2021  09/24/2021  01/07/2022  09/16/2022   ra  Sit stand x 5 with cane  stead  93% and 102/min  92% and 109/min  no  no  yes  x  x   11/25/2022   O2 used ra ra ra  ra  Number laps completed 2 of 3 Did all 3 withe cane Did all 3 with cane  Sit stand x 10  Comments about pace Not done Slow pace with cane Slow pace - stopped 2-3 times in fist lap  Good pace  Resting Pulse Ox/HR 98% and 97/min 99% and HR 102 99% and HR 86  96% and HR 92  Final Pulse Ox/HR 97% and 101/min 99% and HR 112 98% and HR 100  88% and HR 101  Desaturated </= 88% no no no    Desaturated <= 3% points no no no    Got Tachycardic >/= 90/min yes Ye even at rest yes    Symptoms at end of test Not rcorded Modrate dyspnea Moderate to severe dyspnea  7 of 10 dyspnea  Miscellaneous comments x         PFT     Latest Ref Rng & Units 11/25/2022    8:56 AM 04/20/2022    9:18 AM 01/07/2022    8:30 AM 06/23/2021    9:23 AM 02/19/2021   11:47 AM 11/18/2020    9:04 AM 05/09/2020    8:46 AM  ILD indicators  FVC-Pre L 3.08  P 3.11  3.05  3.48  3.49  3.33  3.21   FVC-Predicted Pre % 68  P 68  66  76  75  72  69   FVC-Post L       3.38   FVC-Predicted Post %       73   TLC L       7.61   TLC Predicted %       105   DLCO uncorrected ml/min/mmHg 17.72  P 17.01  16.62  17.94  20.29  19.95  22.07  DLCO UNC %Pred % 67  P 64  62  67  75  74  82   DLCO Corrected ml/min/mmHg 18.88  P 17.01  16.62  17.94  20.29  19.95  21.66   DLCO COR %Pred % 71  P 64  62  67  75  74  81     P Preliminary result      LAB RESULTS last 96 hours No results found.  LAB RESULTS last 90 days Recent Results (from the past 2160 hour(s))  CBC with Differential     Status: Abnormal   Collection Time: 08/28/22  5:17 PM  Result Value Ref Range   WBC 12.1 (H) 4.0 - 10.5 K/uL   RBC 4.28 4.22 - 5.81 MIL/uL   Hemoglobin 13.2 13.0 - 17.0 g/dL   HCT 21.3 08.6 - 57.8 %   MCV 95.6 80.0 - 100.0 fL   MCH 30.8 26.0 - 34.0 pg   MCHC 32.3 30.0 - 36.0 g/dL   RDW 46.9 62.9 - 52.8 %   Platelets 209 150 - 400 K/uL   nRBC 0.0 0.0 - 0.2 %   Neutrophils Relative % 87 %   Neutro Abs 10.6 (H) 1.7 - 7.7 K/uL   Lymphocytes Relative 6 %   Lymphs Abs 0.7 0.7 - 4.0 K/uL   Monocytes Relative 6 %   Monocytes Absolute 0.7 0.1 - 1.0 K/uL   Eosinophils Relative 0 %   Eosinophils Absolute 0.0 0.0 - 0.5 K/uL   Basophils Relative 0 %   Basophils Absolute 0.0 0.0 - 0.1 K/uL   Immature Granulocytes 1 %   Abs Immature Granulocytes 0.08 (H) 0.00 - 0.07 K/uL    Comment: Performed at [facility], [address] [place], [place] [address]  Basic metabolic panel     Status: Abnormal   Collection Time: 08/28/22  5:17 PM  Result Value Ref Range   Sodium 133 (L) 135 - 145 mmol/L   Potassium 4.4 3.5 - 5.1 mmol/L   Chloride 102 98 - 111 mmol/L   CO2 21 (L) 22 - 32 mmol/L   Glucose, Bld 181 (H) 70 - 99 mg/dL    Comment: Glucose reference range applies only to samples taken after fasting for at least 8 hours.   BUN 14 8 - 23 mg/dL   Creatinine, Ser 4.01 0.61 - 1.24 mg/dL   Calcium 8.5 (L) 8.9 - 10.3 mg/dL   GFR, Estimated >02 >72 mL/min    Comment: (NOTE) Calculated using the CKD-EPI Creatinine Equation (2021)    Anion gap 10 5 - 15    Comment: Performed at [facility], [address] [place], [place]  [address]  Brain natriuretic peptide     Status: Abnormal   Collection Time: 08/28/22  5:17 PM  Result Value Ref Range   B Natriuretic Peptide 168.3 (H) 0.0 - 100.0 pg/mL    Comment: Performed at [facility], [address] [place], [place] [address]  I-Stat venous blood gas, ED     Status: Abnormal   Collection Time: 08/28/22  5:27 PM  Result Value Ref Range   pH, Ven 7.411 7.25 - 7.43   pCO2, Ven 35.7 (L) 44 - 60 mmHg   pO2, Ven 41 32 - 45 mmHg   Bicarbonate 22.7 20.0 - 28.0 mmol/L   TCO2 24 22 - 32 mmol/L   O2 Saturation 77 %   Acid-base deficit 1.0 0.0 - 2.0 mmol/L  Sodium 136 135 - 145 mmol/L   Potassium 4.4 3.5 - 5.1 mmol/L   Calcium, Ion 1.14 (L) 1.15 - 1.40 mmol/L   HCT 43.0 39.0 - 52.0 %   Hemoglobin 14.6 13.0 - 17.0 g/dL   Sample type VENOUS   Legionella Pneumophila Serogp 1 Ur Ag     Status: None   Collection Time: 08/29/22  1:49 AM  Result Value Ref Range   L. pneumophila Serogp 1 Ur Ag Negative Negative    Comment: (NOTE) Presumptive negative for L. pneumophila serogroup 1 antigen in urine, suggesting no recent or current infection. Legionnaires' disease cannot be ruled out since other serogroups and species may also cause disease. Performed At: [facility] [address] [place], [place] [id number] Jolene Schimke MD [id number]    Source of Sample URINE     Comment: Performed at [facility], [address], [place], [place] [address]  Basic metabolic panel     Status: Abnormal   Collection Time: 08/29/22  2:51 AM  Result Value Ref Range   Sodium 134 (L) 135 - 145 mmol/L   Potassium 4.1 3.5 - 5.1 mmol/L   Chloride 99 98 - 111 mmol/L   CO2 21 (L) 22 - 32 mmol/L   Glucose, Bld 183 (H) 70 - 99 mg/dL    Comment: Glucose reference range applies only to samples taken after fasting for at least 8 hours.   BUN 13 8 - 23 mg/dL   Creatinine, Ser 4.27 0.61 - 1.24 mg/dL   Calcium 9.2 8.9 - 06.2 mg/dL   GFR, Estimated >37 >62 mL/min     Comment: (NOTE) Calculated using the CKD-EPI Creatinine Equation (2021)    Anion gap 14 5 - 15    Comment: Performed at [facility], [address], [place], [place] [address]  Magnesium     Status: None   Collection Time: 08/29/22  2:51 AM  Result Value Ref Range   Magnesium 2.2 1.7 - 2.4 mg/dL    Comment: Performed at [facility], [address] [place], [place] [address]  CBC     Status: Abnormal   Collection Time: 08/29/22  2:51 AM  Result Value Ref Range   WBC 11.8 (H) 4.0 - 10.5 K/uL   RBC 4.36 4.22 - 5.81 MIL/uL   Hemoglobin 13.8 13.0 - 17.0 g/dL   HCT 73.7 10.6 - 26.9 %   MCV 96.3 80.0 - 100.0 fL   MCH 31.7 26.0 - 34.0 pg   MCHC 32.9 30.0 - 36.0 g/dL   RDW 48.5 46.2 - 70.3 %   Platelets 221 150 - 400 K/uL   nRBC 0.0 0.0 - 0.2 %    Comment: Performed at [facility], [address] [place], [place] [address]  Hepatic function panel     Status: Abnormal   Collection Time: 08/29/22  2:51 AM  Result Value Ref Range   Total Protein 7.2 6.5 - 8.1 g/dL   Albumin 2.8 (L) 3.5 - 5.0 g/dL   AST 31 15 - 41 U/L   ALT 30 0 - 44 U/L   Alkaline Phosphatase 70 38 - 126 U/L   Total Bilirubin 0.4 0.3 - 1.2 mg/dL   Bilirubin, Direct 0.1 0.0 - 0.2 mg/dL   Indirect Bilirubin 0.3 0.3 - 0.9 mg/dL    Comment: Performed at [facility], [address] [place], [place] [address]  Lipase, blood     Status: None  Collection Time: 08/29/22  2:51 AM  Result Value Ref Range   Lipase 47 11 - 51 U/L    Comment: Performed at [facility], [address] [place], [place] [address]  Strep pneumoniae urinary antigen     Status: None   Collection Time: 08/29/22  4:54 AM  Result Value Ref Range   Strep Pneumo Urinary Antigen NEGATIVE NEGATIVE    Comment:        Infection due to S. pneumoniae cannot be absolutely ruled out since the antigen present may be below the detection limit of the test. Performed at [facility], [address] [place], [place]  [address]   Heparin level (unfractionated)     Status: None   Collection Time: 08/29/22 11:40 AM  Result Value Ref Range   Heparin Unfractionated 0.65 0.30 - 0.70 IU/mL    Comment: (NOTE) The clinical reportable range upper limit is being lowered to >1.10 to align with the FDA approved guidance for the current laboratory assay.  If heparin results are below expected values, and patient dosage has  been confirmed, suggest follow up testing of antithrombin III levels. Performed at [facility], [address], [place], [place] [address]   APTT     Status: Abnormal   Collection Time: 08/29/22 11:40 AM  Result Value Ref Range   aPTT 56 (H) 24 - 36 seconds    Comment:        IF BASELINE aPTT IS ELEVATED, SUGGEST PATIENT RISK ASSESSMENT BE USED TO DETERMINE APPROPRIATE ANTICOAGULANT THERAPY. Performed at [facility], [address] [place], [place] [address]   Expectorated Sputum Assessment w Gram Stain, Rflx to Resp Cult     Status: None   Collection Time: 08/29/22  1:00 PM   Specimen: Expectorated Sputum  Result Value Ref Range   Specimen Description EXPECTORATED SPUTUM    Special Requests NONE    Sputum evaluation      THIS SPECIMEN IS ACCEPTABLE FOR SPUTUM CULTURE Performed at [facility], [address], [place], [place] [address]    Report Status 08/29/2022 FINAL   Culture, Respiratory w Gram Stain     Status: None   Collection Time: 08/29/22  1:00 PM  Result Value Ref Range   Specimen Description EXPECTORATED SPUTUM    Special Requests NONE Reflexed from [id number]    Gram Stain      FEW SQUAMOUS EPITHELIAL CELLS PRESENT MODERATE WBC PRESENT, PREDOMINANTLY PMN FEW GRAM POSITIVE COCCI FEW GRAM NEGATIVE RODS FEW GRAM NEGATIVE DIPLOCOCCI FEW GRAM POSITIVE RODS    Culture      RARE Normal respiratory flora-no Staph aureus or Pseudomonas seen Performed at [facility], [address], [place], [place] [address]    Report Status 08/31/2022 FINAL    ECHOCARDIOGRAM COMPLETE     Status: None   Collection Time: 08/29/22  4:11 PM  Result Value Ref Range   Weight 3,148.8 oz   Height 71 in   BP 163/87 mmHg   S' Lateral 3.50 cm   AR max vel 2.65 cm2   AV Area VTI 2.84 cm2   AV Mean grad 3.0 mmHg   AV Peak grad 6.0 mmHg   Ao pk vel 1.22 m/s   Area-P 1/2 5.13 cm2   AV Area mean vel 2.72 cm2   Est EF 60 - 65%   APTT     Status: Abnormal   Collection Time: 08/29/22  9:06 PM  Result Value Ref Range   aPTT 60 (H) 24 - 36 seconds    Comment:        IF BASELINE aPTT IS ELEVATED, SUGGEST PATIENT RISK ASSESSMENT BE USED TO DETERMINE APPROPRIATE ANTICOAGULANT THERAPY. Performed at [facility], [address] [place], [place] [address]   Basic metabolic panel     Status: Abnormal   Collection Time: 08/30/22  6:32 AM  Result Value Ref Range   Sodium 137 135 - 145 mmol/L   Potassium 4.5 3.5 - 5.1 mmol/L   Chloride 101 98 - 111 mmol/L   CO2 25 22 - 32 mmol/L   Glucose, Bld 163 (H) 70 - 99 mg/dL    Comment: Glucose reference range applies only to samples taken after fasting for at least 8 hours.   BUN 15 8 - 23 mg/dL   Creatinine, Ser 6.96 0.61 - 1.24 mg/dL   Calcium 9.1 8.9 - 29.5 mg/dL   GFR, Estimated >28 >41 mL/min    Comment: (NOTE) Calculated using the CKD-EPI Creatinine Equation (2021)    Anion gap 11 5 - 15    Comment: Performed at [facility], [address] [place], [place] [address]  CBC     Status: Abnormal   Collection Time: 08/30/22  6:32 AM  Result Value Ref Range   WBC 12.0 (H) 4.0 - 10.5 K/uL   RBC 4.80 4.22 - 5.81 MIL/uL   Hemoglobin 15.0 13.0 - 17.0 g/dL   HCT 10.2 72.5 - 36.6 %   MCV 98.8 80.0 - 100.0 fL   MCH 31.3 26.0 - 34.0 pg   MCHC 31.6 30.0 - 36.0 g/dL   RDW 44.0 34.7 - 42.5 %   Platelets 287 150 - 400 K/uL   nRBC 0.0 0.0 - 0.2 %    Comment: Performed at [facility], [address], [place], [place] [address]  Heparin level (unfractionated)     Status: Abnormal   Collection  Time: 08/30/22  6:32 AM  Result Value Ref Range   Heparin Unfractionated 0.92 (H) 0.30 - 0.70 IU/mL    Comment: (NOTE) The clinical reportable range upper limit is being lowered to >1.10 to align with the FDA approved guidance for the current laboratory assay.  If heparin results are below expected values, and patient dosage has  been confirmed, suggest follow up testing of antithrombin III levels. Performed at [facility], [address] [place], [place] [address]   APTT     Status: Abnormal   Collection Time: 08/30/22  6:32 AM  Result Value Ref Range   aPTT 77 (H) 24 - 36 seconds    Comment:        IF BASELINE aPTT IS ELEVATED, SUGGEST PATIENT RISK ASSESSMENT BE USED TO DETERMINE APPROPRIATE ANTICOAGULANT THERAPY. Performed at [facility], [address], [place], [place] [address]   Protime-INR     Status: None   Collection Time: 08/30/22 12:05 PM  Result Value Ref Range   Prothrombin Time 14.5 11.4 - 15.2 seconds   INR 1.1 0.8 - 1.2    Comment: (NOTE) INR goal varies based on device and disease states. Performed at [facility], [address] [place], [place] [address]   Aerobic/Anaerobic Culture w Gram Stain (surgical/deep wound)     Status: None   Collection Time: 08/30/22  4:54 PM   Specimen: Gallbladder; Bile  Result Value Ref Range   Specimen Description GALL BLADDER  Special Requests BILE    Gram Stain      RARE WBC PRESENT, PREDOMINANTLY PMN NO ORGANISMS SEEN    Culture      No growth aerobically or anaerobically. Performed at [facility], [address], [place], [place] [address]    Report Status 09/04/2022 FINAL   Basic metabolic panel     Status: Abnormal   Collection Time: 08/31/22  2:02 AM  Result Value Ref Range   Sodium 136 135 - 145 mmol/L   Potassium 3.8 3.5 - 5.1 mmol/L   Chloride 101 98 - 111 mmol/L   CO2 24 22 - 32 mmol/L   Glucose, Bld 123 (H) 70 - 99 mg/dL    Comment: Glucose reference range applies  only to samples taken after fasting for at least 8 hours.   BUN 20 8 - 23 mg/dL   Creatinine, Ser 6.04 0.61 - 1.24 mg/dL   Calcium 8.7 (L) 8.9 - 10.3 mg/dL   GFR, Estimated >54 >09 mL/min    Comment: (NOTE) Calculated using the CKD-EPI Creatinine Equation (2021)    Anion gap 11 5 - 15    Comment: Performed at [facility], [address] [place], [place] [address]  CBC     Status: Abnormal   Collection Time: 08/31/22  2:02 AM  Result Value Ref Range   WBC 13.9 (H) 4.0 - 10.5 K/uL   RBC 4.28 4.22 - 5.81 MIL/uL   Hemoglobin 13.3 13.0 - 17.0 g/dL   HCT 47.8 29.5 - 62.1 %   MCV 97.0 80.0 - 100.0 fL   MCH 31.1 26.0 - 34.0 pg   MCHC 32.0 30.0 - 36.0 g/dL   RDW 30.8 65.7 - 84.6 %   Platelets 264 150 - 400 K/uL   nRBC 0.0 0.0 - 0.2 %    Comment: Performed at [facility], [address] [place], [place] [address]  APTT     Status: Abnormal   Collection Time: 08/31/22  2:02 AM  Result Value Ref Range   aPTT 59 (H) 24 - 36 seconds    Comment:        IF BASELINE aPTT IS ELEVATED, SUGGEST PATIENT RISK ASSESSMENT BE USED TO DETERMINE APPROPRIATE ANTICOAGULANT THERAPY. Performed at [facility], [address], [place], [place] [address]   Heparin level (unfractionated)     Status: None   Collection Time: 08/31/22  2:02 AM  Result Value Ref Range   Heparin Unfractionated 0.58 0.30 - 0.70 IU/mL    Comment: (NOTE) The clinical reportable range upper limit is being lowered to >1.10 to align with the FDA approved guidance for the current laboratory assay.  If heparin results are below expected values, and patient dosage has  been confirmed, suggest follow up testing of antithrombin III levels. Performed at [facility], [address], [place], [place] [address]   APTT     Status: Abnormal   Collection Time: 08/31/22 10:47 AM  Result Value Ref Range   aPTT 88 (H) 24 - 36 seconds    Comment:        IF BASELINE aPTT IS ELEVATED, SUGGEST PATIENT RISK  ASSESSMENT BE USED TO DETERMINE APPROPRIATE ANTICOAGULANT THERAPY. Performed at [facility], [address] [place], [place] [address]   Heparin level (unfractionated)     Status: Abnormal   Collection Time: 09/01/22  6:40 AM  Result Value Ref Range   Heparin Unfractionated >1.10 (H) 0.30 -  0.70 IU/mL    Comment: (NOTE) The clinical reportable range upper limit is being lowered to >1.10 to align with the FDA approved guidance for the current laboratory assay.  If heparin results are below expected values, and patient dosage has  been confirmed, suggest follow up testing of antithrombin III levels. Performed at [facility], [address], [place], [place] [address]   APTT     Status: Abnormal   Collection Time: 09/01/22  6:40 AM  Result Value Ref Range   aPTT 137 (H) 24 - 36 seconds    Comment:        IF BASELINE aPTT IS ELEVATED, SUGGEST PATIENT RISK ASSESSMENT BE USED TO DETERMINE APPROPRIATE ANTICOAGULANT THERAPY. Performed at [facility], [address], [place], [place] [address]   CBC     Status: None   Collection Time: 09/01/22  6:40 AM  Result Value Ref Range   WBC 7.8 4.0 - 10.5 K/uL   RBC 4.54 4.22 - 5.81 MIL/uL   Hemoglobin 14.4 13.0 - 17.0 g/dL   HCT 09.8 11.9 - 14.7 %   MCV 98.7 80.0 - 100.0 fL   MCH 31.7 26.0 - 34.0 pg   MCHC 32.1 30.0 - 36.0 g/dL   RDW 82.9 56.2 - 13.0 %   Platelets 283 150 - 400 K/uL   nRBC 0.0 0.0 - 0.2 %    Comment: Performed at [facility], [address] [place], [place] [address]  Comp Met (CMET)     Status: Abnormal   Collection Time: 09/08/22  2:43 PM  Result Value Ref Range   Sodium 135 135 - 145 mEq/L   Potassium 4.7 3.5 - 5.1 mEq/L   Chloride 104 96 - 112 mEq/L   CO2 21 19 - 32 mEq/L   Glucose, Bld 125 (H) 70 - 99 mg/dL   BUN 16 6 - 23 mg/dL   Creatinine, Ser 4.69 0.40 - 1.50 mg/dL   Total Bilirubin 0.5 0.2 - 1.2 mg/dL   Alkaline Phosphatase 63 39 - 117 U/L   AST 17 0 - 37 U/L   ALT 25 0  - 53 U/L   Total Protein 6.7 6.0 - 8.3 g/dL   Albumin 3.5 3.5 - 5.2 g/dL   GFR 62.95 >28.41 mL/min    Comment: Calculated using the CKD-EPI Creatinine Equation (2021)   Calcium 8.8 8.4 - 10.5 mg/dL  CBC w/Diff     Status: Abnormal   Collection Time: 09/08/22  2:43 PM  Result Value Ref Range   WBC 11.0 (H) 4.0 - 10.5 K/uL   RBC 5.21 4.22 - 5.81 Mil/uL   Hemoglobin 16.1 13.0 - 17.0 g/dL   HCT 32.4 40.1 - 02.7 %   MCV 94.9 78.0 - 100.0 fl   MCHC 32.6 30.0 - 36.0 g/dL   RDW 25.3 66.4 - 40.3 %   Platelets 325.0 150.0 - 400.0 K/uL   Neutrophils Relative % 72.5 43.0 - 77.0 %   Lymphocytes Relative 14.5 12.0 - 46.0 %   Monocytes Relative 10.8 3.0 - 12.0 %   Eosinophils Relative 1.5 0.0 - 5.0 %   Basophils Relative 0.7 0.0 - 3.0 %   Neutro Abs 8.0 (H) 1.4 - 7.7 K/uL   Lymphs Abs 1.6 0.7 - 4.0 K/uL   Monocytes Absolute 1.2 (H) 0.1 - 1.0 K/uL   Eosinophils Absolute 0.2 0.0 - 0.7 K/uL   Basophils Absolute 0.1 0.0 - 0.1 K/uL  Lipase  Status: Abnormal   Collection Time: 09/08/22  2:43 PM  Result Value Ref Range   Lipase 200.0 (H) 11.0 - 59.0 U/L  Lipase     Status: Abnormal   Collection Time: 09/13/22 10:00 AM  Result Value Ref Range   Lipase 133.0 (H) 11.0 - 59.0 U/L  Comp Met (CMET)     Status: Abnormal   Collection Time: 09/13/22 10:00 AM  Result Value Ref Range   Sodium 136 135 - 145 mEq/L   Potassium 4.5 3.5 - 5.1 mEq/L   Chloride 104 96 - 112 mEq/L   CO2 24 19 - 32 mEq/L   Glucose, Bld 119 (H) 70 - 99 mg/dL   BUN 11 6 - 23 mg/dL   Creatinine, Ser 1.61 0.40 - 1.50 mg/dL   Total Bilirubin 0.5 0.2 - 1.2 mg/dL   Alkaline Phosphatase 59 39 - 117 U/L   AST 16 0 - 37 U/L   ALT 23 0 - 53 U/L   Total Protein 6.3 6.0 - 8.3 g/dL   Albumin 3.5 3.5 - 5.2 g/dL   GFR 09.60 >45.40 mL/min    Comment: Calculated using the CKD-EPI Creatinine Equation (2021)   Calcium 8.8 8.4 - 10.5 mg/dL  CBC w/Diff     Status: Abnormal   Collection Time: 09/13/22 10:00 AM  Result Value Ref Range    WBC 7.2 4.0 - 10.5 K/uL   RBC 4.63 4.22 - 5.81 Mil/uL   Hemoglobin 14.3 13.0 - 17.0 g/dL   HCT 98.1 19.1 - 47.8 %   MCV 94.6 78.0 - 100.0 fl   MCHC 32.8 30.0 - 36.0 g/dL   RDW 29.5 62.1 - 30.8 %   Platelets 219.0 150.0 - 400.0 K/uL   Neutrophils Relative % 64.4 43.0 - 77.0 %   Lymphocytes Relative 19.8 12.0 - 46.0 %   Monocytes Relative 13.4 (H) 3.0 - 12.0 %   Eosinophils Relative 1.5 0.0 - 5.0 %   Basophils Relative 0.9 0.0 - 3.0 %   Neutro Abs 4.6 1.4 - 7.7 K/uL   Lymphs Abs 1.4 0.7 - 4.0 K/uL   Monocytes Absolute 1.0 0.1 - 1.0 K/uL   Eosinophils Absolute 0.1 0.0 - 0.7 K/uL   Basophils Absolute 0.1 0.0 - 0.1 K/uL  Lipase, blood     Status: Abnormal   Collection Time: 09/16/22  6:25 PM  Result Value Ref Range   Lipase 66 (H) 11 - 51 U/L    Comment: Performed at [facility], [address] [place], [place] [address]  Comprehensive metabolic panel     Status: Abnormal   Collection Time: 09/16/22  6:25 PM  Result Value Ref Range   Sodium 135 135 - 145 mmol/L   Potassium 4.4 3.5 - 5.1 mmol/L   Chloride 99 98 - 111 mmol/L   CO2 24 22 - 32 mmol/L   Glucose, Bld 118 (H) 70 - 99 mg/dL    Comment: Glucose reference range applies only to samples taken after fasting for at least 8 hours.   BUN 12 8 - 23 mg/dL   Creatinine, Ser 6.96 0.61 - 1.24 mg/dL   Calcium 9.0 8.9 - 29.5 mg/dL   Total Protein 7.6 6.5 - 8.1 g/dL   Albumin 3.7 3.5 - 5.0 g/dL   AST 25 15 - 41 U/L   ALT 29 0 - 44 U/L   Alkaline Phosphatase 67 38 - 126 U/L   Total Bilirubin 0.8 0.3 - 1.2 mg/dL   GFR,  Estimated >60 >60 mL/min    Comment: (NOTE) Calculated using the CKD-EPI Creatinine Equation (2021)    Anion gap 12 5 - 15    Comment: Performed at [facility], [address] [place], [place] [address]  CBC     Status: Abnormal   Collection Time: 09/16/22  6:25 PM  Result Value Ref Range   WBC 14.7 (H) 4.0 - 10.5 K/uL   RBC 5.11 4.22 - 5.81 MIL/uL   Hemoglobin 15.8 13.0 - 17.0 g/dL    HCT 29.5 18.8 - 41.6 %   MCV 94.1 80.0 - 100.0 fL   MCH 30.9 26.0 - 34.0 pg   MCHC 32.8 30.0 - 36.0 g/dL   RDW 60.6 30.1 - 60.1 %   Platelets 200 150 - 400 K/uL   nRBC 0.0 0.0 - 0.2 %    Comment: Performed at [facility], [address] [place], [place] [address]  Blood culture (routine x 2)     Status: None   Collection Time: 09/16/22  7:30 PM   Specimen: BLOOD  Result Value Ref Range   Specimen Description      BLOOD LEFT ANTECUBITAL Performed at [facility], [address] [place], [place] [address]    Special Requests      BOTTLES DRAWN AEROBIC AND ANAEROBIC Blood Culture adequate volume Performed at [facility], [address] [place], [place] [address]    Culture      NO GROWTH 5 DAYS Performed at [facility], [address], [place], [place] [address]    Report Status 09/21/2022 FINAL   Lactic acid, plasma     Status: Abnormal   Collection Time: 09/16/22  7:33 PM  Result Value Ref Range   Lactic Acid, Venous 2.0 (HH) 0.5 - 1.9 mmol/L    Comment: CRITICAL RESULT CALLED TO, READ BACK BY AND VERIFIED WITH Jaquita Folds RN AT 2036 ON 09/16/22 BY PERRY,J Performed at [facility], [address] [place], [place] [address]   Blood culture (routine x 2)     Status: None   Collection Time: 09/16/22  9:00 PM   Specimen: BLOOD  Result Value Ref Range   Specimen Description      BLOOD BLOOD LEFT FOREARM Performed at [facility], [address] [place], [place] [address]    Special Requests      BOTTLES DRAWN AEROBIC AND ANAEROBIC Blood Culture adequate volume Performed at [facility], [address] [place], [place] [address]    Culture      NO GROWTH 5 DAYS Performed at [facility], [address], [place], [place] [address]    Report Status 09/21/2022 FINAL   Urinalysis, Routine w reflex microscopic -Urine, Clean Catch     Status: None   Collection Time: 09/16/22 11:00 PM   Result Value Ref Range   Color, Urine YELLOW YELLOW   APPearance CLEAR CLEAR   Specific Gravity, Urine 1.010 1.005 - 1.030   pH 6.5 5.0 - 8.0   Glucose, UA NEGATIVE NEGATIVE mg/dL   Hgb urine dipstick NEGATIVE NEGATIVE   Bilirubin Urine NEGATIVE NEGATIVE   Ketones, ur NEGATIVE NEGATIVE mg/dL   Protein, ur NEGATIVE NEGATIVE mg/dL   Nitrite NEGATIVE NEGATIVE   Leukocytes,Ua NEGATIVE NEGATIVE    Comment: Microscopic not done on urines with negative protein, blood, leukocytes, nitrite, or glucose < 500 mg/dL.  Performed at [facility], [address] [place], [place] [address]   Lactic acid, plasma     Status: Abnormal   Collection Time: 09/17/22  1:22 AM  Result Value Ref Range   Lactic Acid, Venous 2.3 (HH) 0.5 - 1.9 mmol/L    Comment: CRITICAL VALUE NOTED. VALUE IS CONSISTENT WITH PREVIOUSLY REPORTED/CALLED VALUE Performed at [facility], [address], [place], [place] [address]   MRSA Next Gen by PCR, Nasal     Status: None   Collection Time: 09/17/22  1:22 AM   Specimen: Nasal Mucosa; Nasal Swab  Result Value Ref Range   MRSA by PCR Next Gen NOT DETECTED NOT DETECTED    Comment: (NOTE) The GeneXpert MRSA Assay (FDA approved for NASAL specimens only), is one component of a comprehensive MRSA colonization surveillance program. It is not intended to diagnose MRSA infection nor to guide or monitor treatment for MRSA infections. Test performance is not FDA approved in patients less than 38 years old. Performed at [facility], [address], [place], [place] [address]   CBC with Differential/Platelet     Status: Abnormal   Collection Time: 09/17/22  1:22 AM  Result Value Ref Range   WBC 11.5 (H) 4.0 - 10.5 K/uL   RBC 4.37 4.22 - 5.81 MIL/uL   Hemoglobin 13.5 13.0 - 17.0 g/dL   HCT 53.6 64.4 - 03.4 %   MCV 97.7 80.0 - 100.0 fL   MCH 30.9 26.0 - 34.0 pg   MCHC 31.6 30.0 - 36.0 g/dL   RDW 74.2 59.5 - 63.8 %   Platelets  165 150 - 400 K/uL   nRBC 0.0 0.0 - 0.2 %   Neutrophils Relative % 72 %   Neutro Abs 8.2 (H) 1.7 - 7.7 K/uL   Lymphocytes Relative 18 %   Lymphs Abs 2.0 0.7 - 4.0 K/uL   Monocytes Relative 9 %   Monocytes Absolute 1.1 (H) 0.1 - 1.0 K/uL   Eosinophils Relative 0 %   Eosinophils Absolute 0.0 0.0 - 0.5 K/uL   Basophils Relative 0 %   Basophils Absolute 0.0 0.0 - 0.1 K/uL   Immature Granulocytes 1 %   Abs Immature Granulocytes 0.06 0.00 - 0.07 K/uL    Comment: Performed at [facility], [address], [place], [place] [address]  Comprehensive metabolic panel     Status: Abnormal   Collection Time: 09/17/22  1:22 AM  Result Value Ref Range   Sodium 132 (L) 135 - 145 mmol/L   Potassium 3.9 3.5 - 5.1 mmol/L   Chloride 101 98 - 111 mmol/L   CO2 23 22 - 32 mmol/L   Glucose, Bld 128 (H) 70 - 99 mg/dL    Comment: Glucose reference range applies only to samples taken after fasting for at least 8 hours.   BUN 12 8 - 23 mg/dL   Creatinine, Ser 3.29 0.61 - 1.24 mg/dL   Calcium 8.1 (L) 8.9 - 10.3 mg/dL   Total Protein 6.4 (L) 6.5 - 8.1 g/dL   Albumin 2.9 (L) 3.5 - 5.0 g/dL   AST 18 15 - 41 U/L   ALT 22 0 - 44 U/L   Alkaline Phosphatase 48 38 - 126 U/L   Total Bilirubin 0.7 0.3 - 1.2 mg/dL   GFR, Estimated >51 >88 mL/min    Comment: (NOTE) Calculated using the CKD-EPI Creatinine Equation (2021)    Anion gap 8 5 - 15  Comment: Performed at [facility], [address], [place], [place] [address]  Magnesium     Status: None   Collection Time: 09/17/22  1:22 AM  Result Value Ref Range   Magnesium 2.0 1.7 - 2.4 mg/dL    Comment: Performed at [facility], [address] [place], [place] [address]  Phosphorus     Status: None   Collection Time: 09/17/22  1:22 AM  Result Value Ref Range   Phosphorus 3.6 2.5 - 4.6 mg/dL    Comment: Performed at [facility], [address] [place], [place] [address]   Procalcitonin     Status: None   Collection Time: 09/17/22  1:22 AM  Result Value Ref Range   Procalcitonin 0.42 ng/mL    Comment:        Interpretation: PCT (Procalcitonin) <= 0.5 ng/mL: Systemic infection (sepsis) is not likely. Local bacterial infection is possible. (NOTE)       Sepsis PCT Algorithm           Lower Respiratory Tract                                      Infection PCT Algorithm    ----------------------------     ----------------------------         PCT < 0.25 ng/mL                PCT < 0.10 ng/mL          Strongly encourage             Strongly discourage   discontinuation of antibiotics    initiation of antibiotics    ----------------------------     -----------------------------       PCT 0.25 - 0.50 ng/mL            PCT 0.10 - 0.25 ng/mL               OR       >80% decrease in PCT            Discourage initiation of                                            antibiotics      Encourage discontinuation           of antibiotics    ----------------------------     -----------------------------         PCT >= 0.50 ng/mL              PCT 0.26 - 0.50 ng/mL               AND        <80% decrease in PCT             Encourage initiation of                                             antibiotics       Encourage continuation           of antibiotics    ----------------------------     -----------------------------        PCT >=  0.50 ng/mL                  PCT > 0.50 ng/mL               AND         increase in PCT                  Strongly encourage                                      initiation of antibiotics    Strongly encourage escalation           of antibiotics                                     -----------------------------                                           PCT <= 0.25 ng/mL                                                 OR                                        > 80% decrease in PCT                                      Discontinue / Do not  initiate                                             antibiotics  Performed at [facility], [address] [place], [place] [address]   Lipase, blood     Status: None   Collection Time: 09/17/22  1:22 AM  Result Value Ref Range   Lipase 49 11 - 51 U/L    Comment: Performed at [facility], [address] [place], [place] [address]  Blood gas, venous     Status: Abnormal   Collection Time: 09/17/22  5:02 AM  Result Value Ref Range   pH, Ven 7.4 7.25 - 7.43   pCO2, Ven 41 (L) 44 - 60 mmHg   pO2, Ven 44 32 - 45 mmHg   Bicarbonate 25.4 20.0 - 28.0 mmol/L   Acid-Base Excess 0.5 0.0 - 2.0 mmol/L   O2 Saturation 77.8 %   Patient temperature 37.2    Collection site BLOOD RIGHT ARM    Drawn by [id number]     Comment: Performed at [facility], [address] [place], [place] [address]  Strep pneumoniae urinary antigen     Status: None   Collection Time: 09/17/22  6:17 AM  Result Value Ref Range   Strep Pneumo Urinary Antigen NEGATIVE NEGATIVE    Comment: Performed at  [facility], [address], [place], [place] [address]  Respiratory (~20 pathogens) panel by PCR     Status: None   Collection Time: 09/17/22 12:28 PM   Specimen: Nasopharyngeal Swab; Respiratory  Result Value Ref Range   Adenovirus NOT DETECTED NOT DETECTED   Coronavirus 229E NOT DETECTED NOT DETECTED    Comment: (NOTE) The Coronavirus on the Respiratory Panel, DOES NOT test for the novel  Coronavirus (2019 nCoV)    Coronavirus HKU1 NOT DETECTED NOT DETECTED   Coronavirus NL63 NOT DETECTED NOT DETECTED   Coronavirus OC43 NOT DETECTED NOT DETECTED   Metapneumovirus NOT DETECTED NOT DETECTED   Rhinovirus / Enterovirus NOT DETECTED NOT DETECTED   Influenza A NOT DETECTED NOT DETECTED   Influenza B NOT DETECTED NOT DETECTED   Parainfluenza Virus 1 NOT DETECTED NOT DETECTED   Parainfluenza Virus 2 NOT DETECTED NOT DETECTED   Parainfluenza Virus 3 NOT  DETECTED NOT DETECTED   Parainfluenza Virus 4 NOT DETECTED NOT DETECTED   Respiratory Syncytial Virus NOT DETECTED NOT DETECTED   Bordetella pertussis NOT DETECTED NOT DETECTED   Bordetella Parapertussis NOT DETECTED NOT DETECTED   Chlamydophila pneumoniae NOT DETECTED NOT DETECTED   Mycoplasma pneumoniae NOT DETECTED NOT DETECTED    Comment: Performed at [facility], [address] [place], [place] [address]  Expectorated Sputum Assessment w Gram Stain, Rflx to Resp Cult     Status: None   Collection Time: 09/17/22 10:39 PM   Specimen: Sputum  Result Value Ref Range   Specimen Description SPUTUM    Special Requests NONE    Sputum evaluation      THIS SPECIMEN IS ACCEPTABLE FOR SPUTUM CULTURE Performed at [facility], [address], [place], [place] [address]    Report Status 09/17/2022 FINAL   Culture, Respiratory w Gram Stain     Status: None   Collection Time: 09/17/22 10:39 PM   Specimen: SPU  Result Value Ref Range   Specimen Description      SPUTUM Performed at [facility], [address], [place], [place] [address]    Special Requests      NONE Reflexed from [id number] Performed at [facility], [address], [place], [place] [address]    Gram Stain      RARE WBC PRESENT, PREDOMINANTLY PMN RARE GRAM POSITIVE COCCI    Culture      RARE Normal respiratory flora-no Staph aureus or Pseudomonas seen Performed at [facility], [address] [place], [place] [address]    Report Status 09/20/2022 FINAL   CBC     Status: Abnormal   Collection Time: 09/18/22  5:42 AM  Result Value Ref Range   WBC 7.2 4.0 - 10.5 K/uL   RBC 4.17 (L) 4.22 - 5.81 MIL/uL   Hemoglobin 12.8 (L) 13.0 - 17.0 g/dL   HCT 62.9 52.8 - 41.3 %   MCV 97.6 80.0 - 100.0 fL   MCH 30.7 26.0 - 34.0 pg   MCHC 31.4 30.0 - 36.0 g/dL   RDW 24.4 01.0 - 27.2 %   Platelets 150 150 - 400 K/uL   nRBC 0.0 0.0 - 0.2 %    Comment: Performed at  [facility], [address], [place], [place] [address]  Basic metabolic panel     Status: Abnormal   Collection Time: 09/18/22  5:42 AM  Result Value Ref Range   Sodium 133 (L) 135 - 145  mmol/L   Potassium 4.1 3.5 - 5.1 mmol/L   Chloride 106 98 - 111 mmol/L   CO2 21 (L) 22 - 32 mmol/L   Glucose, Bld 119 (H) 70 - 99 mg/dL    Comment: Glucose reference range applies only to samples taken after fasting for at least 8 hours.   BUN 10 8 - 23 mg/dL   Creatinine, Ser 1.61 0.61 - 1.24 mg/dL   Calcium 8.2 (L) 8.9 - 10.3 mg/dL   GFR, Estimated >09 >60 mL/min    Comment: (NOTE) Calculated using the CKD-EPI Creatinine Equation (2021)    Anion gap 6 5 - 15    Comment: Performed at [facility], [address], [place], [place] [address]  Lactic acid, plasma     Status: Abnormal   Collection Time: 10/05/22  3:38 PM  Result Value Ref Range   Lactic Acid, Venous 2.5 (HH) 0.5 - 1.9 mmol/L    Comment: CRITICAL RESULT CALLED TO, READ BACK BY AND VERIFIED WITH ALEXANDRA NOAH RN AT 1704 ON 10/05/22 BY [email or web address] Performed at [facility], [address] [place], [place] [address]   Comprehensive metabolic panel     Status: Abnormal   Collection Time: 10/05/22  3:38 PM  Result Value Ref Range   Sodium 133 (L) 135 - 145 mmol/L   Potassium 4.1 3.5 - 5.1 mmol/L   Chloride 101 98 - 111 mmol/L   CO2 22 22 - 32 mmol/L   Glucose, Bld 108 (H) 70 - 99 mg/dL    Comment: Glucose reference range applies only to samples taken after fasting for at least 8 hours.   BUN 12 8 - 23 mg/dL   Creatinine, Ser 4.78 0.61 - 1.24 mg/dL   Calcium 8.7 (L) 8.9 - 10.3 mg/dL   Total Protein 7.4 6.5 - 8.1 g/dL   Albumin 3.5 3.5 - 5.0 g/dL   AST 21 15 - 41 U/L   ALT 23 0 - 44 U/L   Alkaline Phosphatase 78 38 - 126 U/L   Total Bilirubin 0.5 0.3 - 1.2 mg/dL   GFR, Estimated >29 >56 mL/min    Comment: (NOTE) Calculated using the CKD-EPI Creatinine Equation (2021)    Anion gap 10  5 - 15    Comment: Performed at [facility], [address] [place], [place] [address]  CBC with Differential     Status: Abnormal   Collection Time: 10/05/22  3:38 PM  Result Value Ref Range   WBC 13.2 (H) 4.0 - 10.5 K/uL   RBC 4.93 4.22 - 5.81 MIL/uL   Hemoglobin 15.1 13.0 - 17.0 g/dL   HCT 65.7 84.6 - 96.2 %   MCV 92.3 80.0 - 100.0 fL   MCH 30.6 26.0 - 34.0 pg   MCHC 33.2 30.0 - 36.0 g/dL   RDW 95.2 84.1 - 32.4 %   Platelets 252 150 - 400 K/uL   nRBC 0.0 0.0 - 0.2 %   Neutrophils Relative % 77 %   Neutro Abs 10.2 (H) 1.7 - 7.7 K/uL   Lymphocytes Relative 10 %   Lymphs Abs 1.3 0.7 - 4.0 K/uL   Monocytes Relative 12 %   Monocytes Absolute 1.6 (H) 0.1 - 1.0 K/uL   Eosinophils Relative 0 %   Eosinophils Absolute 0.0 0.0 - 0.5 K/uL   Basophils Relative 0 %   Basophils Absolute 0.0 0.0 - 0.1 K/uL   Immature Granulocytes 1 %   Abs Immature Granulocytes  0.14 (H) 0.00 - 0.07 K/uL    Comment: Performed at [facility], [address] [place], [place] [address]  Urinalysis, w/ Reflex to Culture (Infection Suspected) -Urine, Clean Catch     Status: Abnormal   Collection Time: 10/05/22  3:38 PM  Result Value Ref Range   Specimen Source URINE, CLEAN CATCH    Color, Urine AMBER (A) YELLOW    Comment: BIOCHEMICALS MAY BE AFFECTED BY COLOR   APPearance CLEAR CLEAR   Specific Gravity, Urine 1.025 1.005 - 1.030   pH 5.5 5.0 - 8.0   Glucose, UA NEGATIVE NEGATIVE mg/dL   Hgb urine dipstick NEGATIVE NEGATIVE   Bilirubin Urine NEGATIVE NEGATIVE   Ketones, ur NEGATIVE NEGATIVE mg/dL   Protein, ur 30 (A) NEGATIVE mg/dL   Nitrite NEGATIVE NEGATIVE   Leukocytes,Ua NEGATIVE NEGATIVE   Squamous Epithelial / HPF 0-5 0 - 5 /HPF   WBC, UA NONE SEEN 0 - 5 WBC/hpf    Comment: Reflex urine culture not performed if WBC <=10, OR if Squamous epithelial cells >5. If Squamous epithelial cells >5, suggest recollection.   RBC / HPF 0-5 0 - 5 RBC/hpf   Bacteria, UA RARE (A) NONE SEEN    Ca Oxalate Crys, UA PRESENT     Comment: Performed at [facility], [address] [place], [place] [address]  Resp panel by RT-PCR (RSV, Flu A&B, Covid) Anterior Nasal Swab     Status: None   Collection Time: 10/05/22  3:51 PM   Specimen: Anterior Nasal Swab  Result Value Ref Range   SARS Coronavirus 2 by RT PCR NEGATIVE NEGATIVE    Comment: (NOTE) SARS-CoV-2 target nucleic acids are NOT DETECTED.  The SARS-CoV-2 RNA is generally detectable in upper respiratory specimens during the acute phase of infection. The lowest concentration of SARS-CoV-2 viral copies this assay can detect is 138 copies/mL. A negative result does not preclude SARS-Cov-2 infection and should not be used as the sole basis for treatment or other patient management decisions. A negative result may occur with  improper specimen collection/handling, submission of specimen other than nasopharyngeal swab, presence of viral mutation(s) within the areas targeted by this assay, and inadequate number of viral copies(<138 copies/mL). A negative result must be combined with clinical observations, patient history, and epidemiological information. The expected result is Negative.  Fact Sheet for Patients:  BloggerCourse.com  Fact Sheet for Healthcare Providers:  SeriousBroker.it  This test is no t yet approved or cleared by the [place] FDA and  has been authorized for detection and/or diagnosis of SARS-CoV-2 by FDA under an Emergency Use Authorization (EUA). This EUA will remain  in effect (meaning this test can be used) for the duration of the COVID-19 declaration under Section 564(b)(1) of the Act, 21 U.S.C.section 360bbb-3(b)(1), unless the authorization is terminated  or revoked sooner.       Influenza A by PCR NEGATIVE NEGATIVE   Influenza B by PCR NEGATIVE NEGATIVE    Comment: (NOTE) The Xpert Xpress SARS-CoV-2/FLU/RSV plus assay is intended  as an aid in the diagnosis of influenza from Nasopharyngeal swab specimens and should not be used as a sole basis for treatment. Nasal washings and aspirates are unacceptable for Xpert Xpress SARS-CoV-2/FLU/RSV testing.  Fact Sheet for Patients: BloggerCourse.com  Fact Sheet for Healthcare Providers: SeriousBroker.it  This test is not yet approved or cleared by the [place] FDA and has been authorized for detection and/or diagnosis of SARS-CoV-2 by FDA under an  Emergency Use Authorization (EUA). This EUA will remain in effect (meaning this test can be used) for the duration of the COVID-19 declaration under Section 564(b)(1) of the Act, 21 U.S.C. section 360bbb-3(b)(1), unless the authorization is terminated or revoked.     Resp Syncytial Virus by PCR NEGATIVE NEGATIVE    Comment: (NOTE) Fact Sheet for Patients: BloggerCourse.com  Fact Sheet for Healthcare Providers: SeriousBroker.it  This test is not yet approved or cleared by the [place] FDA and has been authorized for detection and/or diagnosis of SARS-CoV-2 by FDA under an Emergency Use Authorization (EUA). This EUA will remain in effect (meaning this test can be used) for the duration of the COVID-19 declaration under Section 564(b)(1) of the Act, 21 U.S.C. section 360bbb-3(b)(1), unless the authorization is terminated or revoked.  Performed at [facility], [address] [place], [place] [address]   Protime-INR     Status: Abnormal   Collection Time: 10/05/22  3:51 PM  Result Value Ref Range   Prothrombin Time 15.4 (H) 11.4 - 15.2 seconds   INR 1.2 0.8 - 1.2    Comment: (NOTE) INR goal varies based on device and disease states. Performed at [facility], [address] [place], [place] [address]   APTT     Status: None   Collection Time: 10/05/22  3:51 PM  Result Value Ref  Range   aPTT 36 24 - 36 seconds    Comment: Performed at [facility], [address] [place], [place] [address]  Blood culture (routine x 2)     Status: Abnormal   Collection Time: 10/05/22  4:00 PM   Specimen: BLOOD RIGHT FOREARM  Result Value Ref Range   Specimen Description      BLOOD RIGHT FOREARM Performed at [facility], [address] [place], [place] [address]    Special Requests      BOTTLES DRAWN AEROBIC AND ANAEROBIC Blood Culture adequate volume Performed at [facility], [address] [place], [place] [address]    Culture  Setup Time      GRAM NEGATIVE RODS AEROBIC BOTTLE ONLY CRITICAL RESULT CALLED TO, READ BACK BY AND VERIFIED WITH: PHARMD E.SINCALIR AT 0905 ON 10/06/2022 BY T.SAAD. Performed at [facility], [address] [place], [place] [address]    Culture KLEBSIELLA PNEUMONIAE (A)    Report Status 10/08/2022 FINAL    Organism ID, Bacteria KLEBSIELLA PNEUMONIAE       Susceptibility   Klebsiella pneumoniae - MIC*    AMPICILLIN RESISTANT Resistant     CEFEPIME <=0.12 SENSITIVE Sensitive     CEFTAZIDIME <=1 SENSITIVE Sensitive     CEFTRIAXONE <=0.25 SENSITIVE Sensitive     CIPROFLOXACIN <=0.25 SENSITIVE Sensitive     GENTAMICIN <=1 SENSITIVE Sensitive     IMIPENEM <=0.25 SENSITIVE Sensitive     TRIMETH/SULFA <=20 SENSITIVE Sensitive     AMPICILLIN/SULBACTAM 4 SENSITIVE Sensitive     PIP/TAZO <=4 SENSITIVE Sensitive     * KLEBSIELLA PNEUMONIAE  Blood Culture ID Panel (Reflexed)     Status: Abnormal   Collection Time: 10/05/22  4:00 PM  Result Value Ref Range   Enterococcus faecalis NOT DETECTED NOT DETECTED   Enterococcus Faecium NOT DETECTED NOT DETECTED   Listeria monocytogenes NOT DETECTED NOT DETECTED   Staphylococcus species NOT DETECTED NOT DETECTED   Staphylococcus aureus (BCID) NOT DETECTED NOT DETECTED   Staphylococcus epidermidis NOT DETECTED NOT DETECTED  Staphylococcus lugdunensis NOT DETECTED NOT  DETECTED   Streptococcus species NOT DETECTED NOT DETECTED   Streptococcus agalactiae NOT DETECTED NOT DETECTED   Streptococcus pneumoniae NOT DETECTED NOT DETECTED   Streptococcus pyogenes NOT DETECTED NOT DETECTED   A.calcoaceticus-baumannii NOT DETECTED NOT DETECTED   Bacteroides fragilis NOT DETECTED NOT DETECTED   Enterobacterales DETECTED (A) NOT DETECTED    Comment: Enterobacterales represent a large order of gram negative bacteria, not a single organism. CRITICAL RESULT CALLED TO, READ BACK BY AND VERIFIED WITH: PHARMD E.SINCALIR AT 0905 ON 10/06/2022 BY T.SAAD.    Enterobacter cloacae complex NOT DETECTED NOT DETECTED   Escherichia coli NOT DETECTED NOT DETECTED   Klebsiella aerogenes NOT DETECTED NOT DETECTED   Klebsiella oxytoca NOT DETECTED NOT DETECTED   Klebsiella pneumoniae DETECTED (A) NOT DETECTED    Comment: CRITICAL RESULT CALLED TO, READ BACK BY AND VERIFIED WITH: PHARMD E.SINCALIR AT 0905 ON 10/06/2022 BY T.SAAD.    Proteus species NOT DETECTED NOT DETECTED   Salmonella species NOT DETECTED NOT DETECTED   Serratia marcescens NOT DETECTED NOT DETECTED   Haemophilus influenzae NOT DETECTED NOT DETECTED   Neisseria meningitidis NOT DETECTED NOT DETECTED   Pseudomonas aeruginosa NOT DETECTED NOT DETECTED   Stenotrophomonas maltophilia NOT DETECTED NOT DETECTED   Candida albicans NOT DETECTED NOT DETECTED   Candida auris NOT DETECTED NOT DETECTED   Candida glabrata NOT DETECTED NOT DETECTED   Candida krusei NOT DETECTED NOT DETECTED   Candida parapsilosis NOT DETECTED NOT DETECTED   Candida tropicalis NOT DETECTED NOT DETECTED   Cryptococcus neoformans/gattii NOT DETECTED NOT DETECTED   CTX-M ESBL NOT DETECTED NOT DETECTED   Carbapenem resistance IMP NOT DETECTED NOT DETECTED   Carbapenem resistance KPC NOT DETECTED NOT DETECTED   Carbapenem resistance NDM NOT DETECTED NOT DETECTED   Carbapenem resist OXA 48 LIKE NOT DETECTED NOT DETECTED   Carbapenem resistance  VIM NOT DETECTED NOT DETECTED    Comment: Performed at [facility], [address] [place], [place] [address]  Blood culture (routine x 2)     Status: None   Collection Time: 10/05/22  4:10 PM   Specimen: BLOOD RIGHT ARM  Result Value Ref Range   Specimen Description      BLOOD RIGHT ARM Performed at [facility], [address], [place], [place] [address]    Special Requests      BOTTLES DRAWN AEROBIC AND ANAEROBIC Blood Culture adequate volume Performed at [facility], [address] [place], [place] [address]    Culture      NO GROWTH 5 DAYS Performed at [facility], [address] [place], [place] [address]    Report Status 10/10/2022 FINAL   Lactic acid, plasma     Status: Abnormal   Collection Time: 10/05/22  6:11 PM  Result Value Ref Range   Lactic Acid, Venous 3.3 (HH) 0.5 - 1.9 mmol/L    Comment: CRITICAL RESULT CALLED TO, READ BACK BY AND VERIFIED WITH Alejandro Mulling RN AT 1841 ON 10/05/22 BY I.SUGUT Performed at [facility], [address] [place], [place] [address]   Lactic acid, plasma     Status: None   Collection Time: 10/05/22  7:58 PM  Result Value Ref Range   Lactic Acid, Venous 1.6 0.5 - 1.9 mmol/L    Comment: Performed at [facility], [address] [place], [place] [address]  C-reactive protein     Status:  Abnormal   Collection Time: 10/06/22  5:20 AM  Result Value Ref Range   CRP 8.4 (H) <1.0 mg/dL    Comment: Performed at [facility], [address], [place], [place] [address]  Comprehensive metabolic panel     Status: Abnormal   Collection Time: 10/06/22  5:24 AM  Result Value Ref Range   Sodium 134 (L) 135 - 145 mmol/L   Potassium 4.3 3.5 - 5.1 mmol/L   Chloride 103 98 - 111 mmol/L   CO2 21 (L) 22 - 32 mmol/L   Glucose, Bld 104 (H) 70 - 99 mg/dL    Comment: Glucose reference range applies only to samples taken after fasting for at least 8 hours.   BUN 11 8 - 23 mg/dL   Creatinine,  Ser 1.91 0.61 - 1.24 mg/dL   Calcium 8.0 (L) 8.9 - 10.3 mg/dL   Total Protein 5.6 (L) 6.5 - 8.1 g/dL   Albumin 2.5 (L) 3.5 - 5.0 g/dL   AST 21 15 - 41 U/L   ALT 23 0 - 44 U/L   Alkaline Phosphatase 62 38 - 126 U/L   Total Bilirubin 0.5 0.3 - 1.2 mg/dL   GFR, Estimated >47 >82 mL/min    Comment: (NOTE) Calculated using the CKD-EPI Creatinine Equation (2021)    Anion gap 10 5 - 15    Comment: Performed at [facility], [address], [place], [place] [address]  CBC     Status: Abnormal   Collection Time: 10/06/22  5:24 AM  Result Value Ref Range   WBC 9.9 4.0 - 10.5 K/uL   RBC 3.96 (L) 4.22 - 5.81 MIL/uL   Hemoglobin 12.1 (L) 13.0 - 17.0 g/dL   HCT 30.8 (L) 65.7 - 84.6 %   MCV 93.4 80.0 - 100.0 fL   MCH 30.6 26.0 - 34.0 pg   MCHC 32.7 30.0 - 36.0 g/dL   RDW 96.2 95.2 - 84.1 %   Platelets 171 150 - 400 K/uL   nRBC 0.0 0.0 - 0.2 %    Comment: Performed at [facility], [address], [place], [place] [address]  Magnesium     Status: Abnormal   Collection Time: 10/06/22  5:24 AM  Result Value Ref Range   Magnesium 1.6 (L) 1.7 - 2.4 mg/dL    Comment: Performed at [facility], [address], [place], [place] [address]  Procalcitonin     Status: None   Collection Time: 10/06/22  5:24 AM  Result Value Ref Range   Procalcitonin 0.20 ng/mL    Comment:        Interpretation: PCT (Procalcitonin) <= 0.5 ng/mL: Systemic infection (sepsis) is not likely. Local bacterial infection is possible. (NOTE)       Sepsis PCT Algorithm           Lower Respiratory Tract                                      Infection PCT Algorithm    ----------------------------     ----------------------------         PCT < 0.25 ng/mL                PCT < 0.10 ng/mL          Strongly encourage             Strongly discourage  discontinuation of antibiotics    initiation of antibiotics    ----------------------------     -----------------------------       PCT 0.25 - 0.50 ng/mL             PCT 0.10 - 0.25 ng/mL               OR       >80% decrease in PCT            Discourage initiation of                                            antibiotics      Encourage discontinuation           of antibiotics    ----------------------------     -----------------------------         PCT >= 0.50 ng/mL              PCT 0.26 - 0.50 ng/mL               AND        <80% decrease in PCT             Encourage initiation of                                             antibiotics       Encourage continuation           of antibiotics    ----------------------------     -----------------------------        PCT >= 0.50 ng/mL                  PCT > 0.50 ng/mL               AND         increase in PCT                  Strongly encourage                                      initiation of antibiotics    Strongly encourage escalation           of antibiotics                                     -----------------------------                                           PCT <= 0.25 ng/mL                                                 OR                                        >  80% decrease in PCT                                      Discontinue / Do not initiate                                             antibiotics  Performed at [facility], [address], [place], [place] [address]   ABO/Rh     Status: None   Collection Time: 10/06/22  5:24 AM  Result Value Ref Range   ABO/RH(D)      A POS Performed at [facility], [address], [place], [place] [address]   Type and screen     Status: None   Collection Time: 10/06/22  9:54 AM  Result Value Ref Range   ABO/RH(D) A POS    Antibody Screen NEG    Sample Expiration      10/09/2022,2359 Performed at [facility], [address] [place], [place] [address]   Protime-INR     Status: Abnormal   Collection Time: 10/06/22 10:01 AM  Result Value Ref Range   Prothrombin Time 17.6 (H) 11.4 - 15.2 seconds   INR 1.4 (H) 0.8  - 1.2    Comment: (NOTE) INR goal varies based on device and disease states. Performed at [facility], [address] [place], [place] [address]   Comprehensive metabolic panel     Status: Abnormal   Collection Time: 10/07/22  4:43 AM  Result Value Ref Range   Sodium 133 (L) 135 - 145 mmol/L   Potassium 3.7 3.5 - 5.1 mmol/L   Chloride 103 98 - 111 mmol/L   CO2 25 22 - 32 mmol/L   Glucose, Bld 102 (H) 70 - 99 mg/dL    Comment: Glucose reference range applies only to samples taken after fasting for at least 8 hours.   BUN 10 8 - 23 mg/dL   Creatinine, Ser 4.74 0.61 - 1.24 mg/dL   Calcium 8.0 (L) 8.9 - 10.3 mg/dL   Total Protein 5.5 (L) 6.5 - 8.1 g/dL   Albumin 2.3 (L) 3.5 - 5.0 g/dL   AST 14 (L) 15 - 41 U/L   ALT 16 0 - 44 U/L   Alkaline Phosphatase 52 38 - 126 U/L   Total Bilirubin 0.7 0.3 - 1.2 mg/dL   GFR, Estimated >25 >95 mL/min    Comment: (NOTE) Calculated using the CKD-EPI Creatinine Equation (2021)    Anion gap 5 5 - 15    Comment: Performed at [facility], [address], [place], [place] [address]  Magnesium     Status: None   Collection Time: 10/07/22  4:43 AM  Result Value Ref Range   Magnesium 1.7 1.7 - 2.4 mg/dL    Comment: Performed at [facility], [address], [place], [place] [address]  C-reactive protein     Status: Abnormal   Collection Time: 10/07/22  4:43 AM  Result Value Ref Range   CRP 17.2 (H) <1.0 mg/dL    Comment: Performed at [facility], [address], [place], [place] [address]  Procalcitonin     Status: None   Collection Time: 10/07/22  4:43 AM  Result Value Ref Range  Procalcitonin 0.10 ng/mL    Comment:        Interpretation: PCT (Procalcitonin) <= 0.5 ng/mL: Systemic infection (sepsis) is not likely. Local bacterial infection is possible. (NOTE)       Sepsis PCT Algorithm           Lower Respiratory Tract                                      Infection PCT Algorithm    ----------------------------      ----------------------------         PCT < 0.25 ng/mL                PCT < 0.10 ng/mL          Strongly encourage             Strongly discourage   discontinuation of antibiotics    initiation of antibiotics    ----------------------------     -----------------------------       PCT 0.25 - 0.50 ng/mL            PCT 0.10 - 0.25 ng/mL               OR       >80% decrease in PCT            Discourage initiation of                                            antibiotics      Encourage discontinuation           of antibiotics    ----------------------------     -----------------------------         PCT >= 0.50 ng/mL              PCT 0.26 - 0.50 ng/mL               AND        <80% decrease in PCT             Encourage initiation of                                             antibiotics       Encourage continuation           of antibiotics    ----------------------------     -----------------------------        PCT >= 0.50 ng/mL                  PCT > 0.50 ng/mL               AND         increase in PCT                  Strongly encourage                                      initiation of antibiotics    Strongly encourage escalation           of antibiotics                                     -----------------------------  PCT <= 0.25 ng/mL                                                 OR                                        > 80% decrease in PCT                                      Discontinue / Do not initiate                                             antibiotics  Performed at [facility], [address] [place], [place] [address]   CBC with Differential/Platelet     Status: Abnormal   Collection Time: 10/07/22  4:43 AM  Result Value Ref Range   WBC 8.1 4.0 - 10.5 K/uL   RBC 3.71 (L) 4.22 - 5.81 MIL/uL   Hemoglobin 11.2 (L) 13.0 - 17.0 g/dL   HCT 96.2 (L) 95.2 - 84.1 %   MCV 96.5 80.0 - 100.0 fL   MCH 30.2 26.0 - 34.0 pg    MCHC 31.3 30.0 - 36.0 g/dL   RDW 32.4 40.1 - 02.7 %   Platelets 159 150 - 400 K/uL   nRBC 0.0 0.0 - 0.2 %   Neutrophils Relative % 62 %   Neutro Abs 4.9 1.7 - 7.7 K/uL   Lymphocytes Relative 24 %   Lymphs Abs 1.9 0.7 - 4.0 K/uL   Monocytes Relative 14 %   Monocytes Absolute 1.2 (H) 0.1 - 1.0 K/uL   Eosinophils Relative 0 %   Eosinophils Absolute 0.0 0.0 - 0.5 K/uL   Basophils Relative 0 %   Basophils Absolute 0.0 0.0 - 0.1 K/uL   Immature Granulocytes 0 %   Abs Immature Granulocytes 0.03 0.00 - 0.07 K/uL    Comment: Performed at [facility], [address], [place], [place] [address]  Brain natriuretic peptide     Status: Abnormal   Collection Time: 10/07/22  4:43 AM  Result Value Ref Range   B Natriuretic Peptide 309.0 (H) 0.0 - 100.0 pg/mL    Comment: Performed at [facility], [address], [place], [place] [address]  Comprehensive metabolic panel     Status: Abnormal   Collection Time: 10/08/22 12:42 AM  Result Value Ref Range   Sodium 135 135 - 145 mmol/L   Potassium 3.5 3.5 - 5.1 mmol/L   Chloride 103 98 - 111 mmol/L   CO2 25 22 - 32 mmol/L   Glucose, Bld 109 (H) 70 - 99 mg/dL    Comment: Glucose reference range applies only to samples taken after fasting for at least 8 hours.   BUN 7 (L) 8 - 23 mg/dL   Creatinine, Ser 7.42 0.61 - 1.24 mg/dL   Calcium 8.4 (L) 8.9 - 10.3 mg/dL   Total Protein 5.8 (L) 6.5 - 8.1 g/dL   Albumin 2.6 (L) 3.5 - 5.0 g/dL   AST 15  15 - 41 U/L   ALT 17 0 - 44 U/L   Alkaline Phosphatase 51 38 - 126 U/L   Total Bilirubin 0.3 0.3 - 1.2 mg/dL   GFR, Estimated >21 >30 mL/min    Comment: (NOTE) Calculated using the CKD-EPI Creatinine Equation (2021)    Anion gap 7 5 - 15    Comment: Performed at [facility], [address] [place], [place] [address]  Magnesium     Status: None   Collection Time: 10/08/22 12:42 AM  Result Value Ref Range   Magnesium 2.0 1.7 - 2.4 mg/dL    Comment: Performed at [facility],  [address] [place], [place] [address]  C-reactive protein     Status: Abnormal   Collection Time: 10/08/22 12:42 AM  Result Value Ref Range   CRP 13.6 (H) <1.0 mg/dL    Comment: Performed at [facility], [address], [place], [place] [address]  Procalcitonin     Status: None   Collection Time: 10/08/22 12:42 AM  Result Value Ref Range   Procalcitonin <0.10 ng/mL    Comment:        Interpretation: PCT (Procalcitonin) <= 0.5 ng/mL: Systemic infection (sepsis) is not likely. Local bacterial infection is possible. (NOTE)       Sepsis PCT Algorithm           Lower Respiratory Tract                                      Infection PCT Algorithm    ----------------------------     ----------------------------         PCT < 0.25 ng/mL                PCT < 0.10 ng/mL          Strongly encourage             Strongly discourage   discontinuation of antibiotics    initiation of antibiotics    ----------------------------     -----------------------------       PCT 0.25 - 0.50 ng/mL            PCT 0.10 - 0.25 ng/mL               OR       >80% decrease in PCT            Discourage initiation of                                            antibiotics      Encourage discontinuation           of antibiotics    ----------------------------     -----------------------------         PCT >= 0.50 ng/mL              PCT 0.26 - 0.50 ng/mL               AND        <80% decrease in PCT             Encourage initiation of  antibiotics       Encourage continuation           of antibiotics    ----------------------------     -----------------------------        PCT >= 0.50 ng/mL                  PCT > 0.50 ng/mL               AND         increase in PCT                  Strongly encourage                                      initiation of antibiotics    Strongly encourage escalation           of antibiotics                                      -----------------------------                                           PCT <= 0.25 ng/mL                                                 OR                                        > 80% decrease in PCT                                      Discontinue / Do not initiate                                             antibiotics  Performed at [facility], [address], [place], [place] [address]   CBC with Differential/Platelet     Status: Abnormal   Collection Time: 10/08/22 12:42 AM  Result Value Ref Range   WBC 6.7 4.0 - 10.5 K/uL   RBC 3.62 (L) 4.22 - 5.81 MIL/uL   Hemoglobin 11.0 (L) 13.0 - 17.0 g/dL   HCT 65.7 (L) 84.6 - 96.2 %   MCV 94.5 80.0 - 100.0 fL   MCH 30.4 26.0 - 34.0 pg   MCHC 32.2 30.0 - 36.0 g/dL   RDW 95.2 84.1 - 32.4 %   Platelets 145 (L) 150 - 400 K/uL   nRBC 0.0 0.0 - 0.2 %   Neutrophils Relative % 63 %   Neutro Abs 4.2 1.7 - 7.7 K/uL   Lymphocytes Relative 23 %   Lymphs Abs 1.6 0.7 - 4.0 K/uL   Monocytes Relative 14 %   Monocytes Absolute 0.9 0.1 - 1.0 K/uL   Eosinophils Relative  0 %   Eosinophils Absolute 0.0 0.0 - 0.5 K/uL   Basophils Relative 0 %   Basophils Absolute 0.0 0.0 - 0.1 K/uL   Immature Granulocytes 0 %   Abs Immature Granulocytes 0.03 0.00 - 0.07 K/uL    Comment: Performed at [facility], [address], [place], [place] [address]  Brain natriuretic peptide     Status: Abnormal   Collection Time: 10/08/22 12:42 AM  Result Value Ref Range   B Natriuretic Peptide 318.2 (H) 0.0 - 100.0 pg/mL    Comment: Performed at [facility], [address] [place], [place] [address]  Magnesium     Status: None   Collection Time: 10/09/22  3:32 AM  Result Value Ref Range   Magnesium 2.0 1.7 - 2.4 mg/dL    Comment: Performed at [facility], [address], [place], [place] [address]  C-reactive protein     Status: Abnormal   Collection Time: 10/09/22  3:32 AM  Result Value Ref Range   CRP 7.7 (H) <1.0 mg/dL    Comment:  Performed at [facility], [address] [place], [place] [address]  Procalcitonin     Status: None   Collection Time: 10/09/22  3:32 AM  Result Value Ref Range   Procalcitonin <0.10 ng/mL    Comment:        Interpretation: PCT (Procalcitonin) <= 0.5 ng/mL: Systemic infection (sepsis) is not likely. Local bacterial infection is possible. (NOTE)       Sepsis PCT Algorithm           Lower Respiratory Tract                                      Infection PCT Algorithm    ----------------------------     ----------------------------         PCT < 0.25 ng/mL                PCT < 0.10 ng/mL          Strongly encourage             Strongly discourage   discontinuation of antibiotics    initiation of antibiotics    ----------------------------     -----------------------------       PCT 0.25 - 0.50 ng/mL            PCT 0.10 - 0.25 ng/mL               OR       >80% decrease in PCT            Discourage initiation of                                            antibiotics      Encourage discontinuation           of antibiotics    ----------------------------     -----------------------------         PCT >= 0.50 ng/mL              PCT 0.26 - 0.50 ng/mL               AND        <80% decrease in PCT  Encourage initiation of                                             antibiotics       Encourage continuation           of antibiotics    ----------------------------     -----------------------------        PCT >= 0.50 ng/mL                  PCT > 0.50 ng/mL               AND         increase in PCT                  Strongly encourage                                      initiation of antibiotics    Strongly encourage escalation           of antibiotics                                     -----------------------------                                           PCT <= 0.25 ng/mL                                                 OR                                        > 80%  decrease in PCT                                      Discontinue / Do not initiate                                             antibiotics  Performed at [facility], [address] [place], [place] [address]   Comprehensive metabolic panel     Status: Abnormal   Collection Time: 10/09/22  3:32 AM  Result Value Ref Range   Sodium 138 135 - 145 mmol/L   Potassium 4.0 3.5 - 5.1 mmol/L   Chloride 101 98 - 111 mmol/L   CO2 27 22 - 32 mmol/L   Glucose, Bld 106 (H) 70 - 99 mg/dL    Comment: Glucose reference range applies only to samples taken after fasting for at least 8 hours.   BUN <5 (L) 8 - 23 mg/dL   Creatinine, Ser 6.04 0.61 - 1.24 mg/dL   Calcium 8.5 (L) 8.9 -  10.3 mg/dL   Total Protein 6.2 (L) 6.5 - 8.1 g/dL   Albumin 2.5 (L) 3.5 - 5.0 g/dL   AST 15 15 - 41 U/L   ALT 16 0 - 44 U/L   Alkaline Phosphatase 49 38 - 126 U/L   Total Bilirubin 0.4 0.3 - 1.2 mg/dL   GFR, Estimated >40 >98 mL/min    Comment: (NOTE) Calculated using the CKD-EPI Creatinine Equation (2021)    Anion gap 10 5 - 15    Comment: Performed at [facility], [address] [place], [place] [address]  CBC with Differential/Platelet     Status: Abnormal   Collection Time: 10/09/22  3:32 AM  Result Value Ref Range   WBC 7.5 4.0 - 10.5 K/uL   RBC 3.82 (L) 4.22 - 5.81 MIL/uL   Hemoglobin 11.5 (L) 13.0 - 17.0 g/dL   HCT 78.2 (L) 95.6 - 21.3 %   MCV 93.7 80.0 - 100.0 fL   MCH 30.1 26.0 - 34.0 pg   MCHC 32.1 30.0 - 36.0 g/dL   RDW 08.6 57.8 - 46.9 %   Platelets 173 150 - 400 K/uL   nRBC 0.0 0.0 - 0.2 %   Neutrophils Relative % 72 %   Neutro Abs 5.4 1.7 - 7.7 K/uL   Lymphocytes Relative 15 %   Lymphs Abs 1.1 0.7 - 4.0 K/uL   Monocytes Relative 13 %   Monocytes Absolute 1.0 0.1 - 1.0 K/uL   Eosinophils Relative 0 %   Eosinophils Absolute 0.0 0.0 - 0.5 K/uL   Basophils Relative 0 %   Basophils Absolute 0.0 0.0 - 0.1 K/uL   Immature Granulocytes 0 %   Abs Immature Granulocytes 0.03 0.00 -  0.07 K/uL    Comment: Performed at [facility], [address], [place], [place] [address]  Brain natriuretic peptide     Status: Abnormal   Collection Time: 10/09/22  3:32 AM  Result Value Ref Range   B Natriuretic Peptide 330.7 (H) 0.0 - 100.0 pg/mL    Comment: Performed at [facility], [address], [place], [place] [address]  Basic metabolic panel     Status: None   Collection Time: 10/15/22 11:39 AM  Result Value Ref Range   Sodium 139 135 - 145 mEq/L   Potassium 4.9 3.5 - 5.1 mEq/L   Chloride 103 96 - 112 mEq/L   CO2 28 19 - 32 mEq/L   Glucose, Bld 87 70 - 99 mg/dL   BUN 10 6 - 23 mg/dL   Creatinine, Ser 4.40 0.40 - 1.50 mg/dL   GFR 10.27 >25.36 mL/min    Comment: Calculated using the CKD-EPI Creatinine Equation (2021)   Calcium 9.0 8.4 - 10.5 mg/dL  CBC with Differential/Platelet     Status: Abnormal   Collection Time: 10/15/22 11:39 AM  Result Value Ref Range   WBC 7.0 4.0 - 10.5 K/uL   RBC 4.18 (L) 4.22 - 5.81 Mil/uL   Hemoglobin 12.6 (L) 13.0 - 17.0 g/dL   HCT 64.4 03.4 - 74.2 %   MCV 94.4 78.0 - 100.0 fl   MCHC 32.0 30.0 - 36.0 g/dL   RDW 59.5 63.8 - 75.6 %   Platelets 397.0 150.0 - 400.0 K/uL   Neutrophils Relative % 59.7 43.0 - 77.0 %   Lymphocytes Relative 22.4 12.0 - 46.0 %   Monocytes Relative 14.5 (H) 3.0 - 12.0 %   Eosinophils Relative 2.9 0.0 - 5.0 %  Basophils Relative 0.5 0.0 - 3.0 %   Neutro Abs 4.1 1.4 - 7.7 K/uL   Lymphs Abs 1.6 0.7 - 4.0 K/uL   Monocytes Absolute 1.0 0.1 - 1.0 K/uL   Eosinophils Absolute 0.2 0.0 - 0.7 K/uL   Basophils Absolute 0.0 0.0 - 0.1 K/uL  ECHOCARDIOGRAM COMPLETE     Status: None   Collection Time: 11/17/22  2:49 PM  Result Value Ref Range   S' Lateral 3.70 cm   Est EF 60 - 65%   Pulmonary Function Test     Status: None (Preliminary result)   Collection Time: 11/25/22  8:56 AM  Result Value Ref Range   FVC-Pre 3.08 L   FVC-%Pred-Pre 68 %   FEV1-Pre 2.34 L   FEV1-%Pred-Pre 70 %   FEV6-Pre 3.06 L    FEV6-%Pred-Pre 71 %   Pre FEV1/FVC ratio 76 %   FEV1FVC-%Pred-Pre 103 %   Pre FEV6/FVC Ratio 99 %   FEV6FVC-%Pred-Pre 105 %   FEF 25-75 Pre 1.81 L/sec   FEF2575-%Pred-Pre 73 %   DLCO unc 17.72 ml/min/mmHg   DLCO unc % pred 67 %   DLCO cor 18.88 ml/min/mmHg   DLCO cor % pred 71 %   DL/VA 1.61 ml/min/mmHg/L   DL/VA % pred 82 %         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), Allergic rhinitis due to pollen (09/03/2013), Allergy, Anticoagulated (07/01/2016), Anxiety, Arthritis, Atrial fibrillation (HCC), Atrial flutter (HCC) (06/26/2015), CAD (coronary artery disease), Chest pain (06/26/2015), COPD (chronic obstructive pulmonary disease) (HCC), Coronary artery disease (07/06/2018), DDD (degenerative disc disease), cervical (09/03/2013), Depression, Depression, Depressive disorder (09/03/2013), Dizziness (10/28/2017), Dyslipidemia, goal LDL below 70 (07/06/2018), Dyspnea on exertion (10/20/2018), Esophageal reflux (09/03/2013), Essential hypertension (07/06/2018), ETOH abuse (01/11/2019), Falls (10/28/2017), GERD (gastroesophageal reflux disease), H/O amiodarone therapy (07/01/2016), Heart attack (HCC), Heart palpitations (01/27/2017), History of colon polyps, Hyperlipidemia, Hypertension, IPF (idiopathic pulmonary fibrosis) (HCC), Kidney stones, Neck pain (09/03/2013), Neuropathy (07/06/2018), Obstructive sleep apnea (08/05/2015), PLMD (periodic limb movement disorder) (11/04/2015), Polycythemia, 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   reports that he quit smoking about 3 years ago. His smoking use included cigarettes. He started smoking about 33 years ago. He has a 30 pack-year smoking history. He has quit  using smokeless tobacco.  Past Surgical History:  Procedure Laterality Date   ATRIAL FIBRILLATION ABLATION  10/2015   BACK SURGERY     CARDIOVERSION  2017   CATARACT EXTRACTION Bilateral 2017   March and April 2017   COLONOSCOPY  2018   CORONARY STENT PLACEMENT  2012   CORONARY/GRAFT ACUTE MI REVASCULARIZATION N/A 01/10/2019   Procedure: CORONARY/GRAFT ACUTE MI REVASCULARIZATION;  Surgeon: Marykay Lex, MD;  Location: [facility] INVASIVE CV LAB;  Service: Cardiovascular;  Laterality: N/A;   INGUINAL HERNIA REPAIR     over 20 years ago   IR CHOLANGIOGRAM EXISTING TUBE  09/16/2022   IR EXCHANGE BILIARY DRAIN  10/06/2022   IR PERC CHOLECYSTOSTOMY  08/30/2022   LAPAROSCOPIC APPENDECTOMY N/A 01/17/2021   Procedure: APPENDECTOMY LAPAROSCOPIC;  Surgeon: Quentin Ore, MD;  Location: [facility] OR;  Service: General;  Laterality: N/A;   LEFT HEART CATH AND CORONARY ANGIOGRAPHY N/A 01/10/2019   Procedure: LEFT HEART CATH AND CORONARY ANGIOGRAPHY;  Surgeon: Marykay Lex, MD;  Location[facility] INVASIVE CV LAB;  Service:  Cardiovascular;  Laterality: N/A;   LUMBAR LAMINECTOMY Bilateral 04/05/2016   L2-L5    VENTRAL HERNIA REPAIR  2018    Allergies  Allergen Reactions   Naproxen Other (See Comments)    (Naprosyn *ANALGESICS - ANTI-INFLAMMATORY*) Nausea, Abdominal pain   Silodosin Other (See Comments)    Low blood pressure    Immunization History  Administered Date(s) Administered   Influenza Split 01/09/2010, 02/09/2011   Influenza, High Dose Seasonal PF 11/15/2018, 01/11/2020, 12/27/2020, 12/24/2021   Influenza,inj,Quad PF,6+ Mos 01/01/2015   Influenza-Unspecified 01/30/2014, 02/02/2016, 12/15/2020   PFIZER(Purple Top)SARS-COV-2 Vaccination 04/27/2019, 05/18/2019, 01/14/2020, 07/22/2020, 12/15/2020, 12/24/2021   Pneumococcal Conjugate-13 11/23/2018   Pneumococcal Polysaccharide-23 08/12/2020   Respiratory Syncytial Virus Vaccine,Recomb Aduvanted(Arexvy) 12/17/2021   Tdap 02/18/2011     Family History  Problem Relation Age of Onset   Anxiety disorder Mother    Alcohol abuse Father    Colon cancer Father    Depression Brother    Alcohol abuse Brother    Throat cancer Brother      Current Outpatient Medications:    acetaminophen (TYLENOL) 500 MG tablet, Take 1 tablet (500 mg total) by mouth every 8 (eight) hours as needed for moderate pain., Disp: 100 tablet, Rfl: 0   apixaban (ELIQUIS) 5 MG TABS tablet, Take 1 tablet (5 mg total) by mouth 2 (two) times daily., Disp: 60 tablet, Rfl: 0   atorvastatin (LIPITOR) 80 MG tablet, Take 1 tablet (80 mg total) by mouth daily. (Patient taking differently: Take 80 mg by mouth every evening.), Disp: 90 tablet, Rfl: 2   buPROPion (WELLBUTRIN XL) 300 MG 24 hr tablet, Take 1 tablet (300 mg total) by mouth daily., Disp: 90 tablet, Rfl: 1   busPIRone (BUSPAR) 15 MG tablet, Take 1 tablet (15 mg total) by mouth 3 (three) times daily., Disp: 90 tablet, Rfl: 3   Cholecalciferol (VITAMIN D3) 25 MCG (1000 UT) CAPS, Take 2 capsules by mouth 2 (two) times daily., Disp: , Rfl:    docusate sodium (COLACE) 100 MG capsule, Take 100 mg by mouth daily., Disp: , Rfl:    famotidine (PEPCID) 40 MG tablet, TAKE 1 TABLET(40 MG) BY MOUTH AT BEDTIME (Patient taking differently: Take 40 mg by mouth at bedtime.), Disp: 90 tablet, Rfl: 2   fexofenadine (ALLEGRA) 180 MG tablet, Take 180 mg by mouth at bedtime. , Disp: , Rfl:    finasteride (PROSCAR) 5 MG tablet, Take 1 tablet (5 mg total) by mouth daily., Disp: 90 tablet, Rfl: 1   lamoTRIgine (LAMICTAL) 100 MG tablet, Take 1 tablet (100 mg total) by mouth daily., Disp: 30 tablet, Rfl: 1   levalbuterol (XOPENEX) 1.25 MG/0.5ML nebulizer solution, Take 1.25 mg by nebulization every 4 (four) hours as needed for wheezing or shortness of breath., Disp: 1 each, Rfl: 12   LORazepam (ATIVAN) 0.5 MG tablet, Take one tablet by mouth only for severe anxiety or agitation (Patient taking differently: Take 0.5 mg by mouth daily  as needed for anxiety. Take one tablet by mouth only for severe anxiety or agitation. Has not taking medication yet.), Disp: 30 tablet, Rfl: 1   Melatonin 10 MG TABS, Take 1 tablet by mouth at bedtime., Disp: , Rfl:    MUCINEX 600 MG 12 hr tablet, TAKE 2 TABLETS BY MOUTH TWICE DAILY, Disp: 120 tablet, Rfl: 0   Multiple Vitamins-Minerals (PRESERVISION AREDS PO), Take 1 capsule by mouth in the morning and at bedtime., Disp: , Rfl:    nitroGLYCERIN (NITROSTAT) 0.4 MG SL tablet, Place 1 tablet (  0.4 mg total) under the tongue every 5 (five) minutes as needed for chest pain., Disp: 25 tablet, Rfl: 3   ondansetron (ZOFRAN) 4 MG tablet, Take 1 tablet (4 mg total) by mouth every 8 (eight) hours as needed for nausea or vomiting., Disp: 20 tablet, Rfl: 0   pantoprazole (PROTONIX) 40 MG tablet, TAKE 1 TABLET(40 MG) BY MOUTH DAILY (Patient taking differently: Take 40 mg by mouth daily.), Disp: 90 tablet, Rfl: 1   Pirfenidone (ESBRIET) 801 MG TABS, Take 1 tablet (801 mg total) by mouth 3 (three) times daily., Disp: 270 tablet, Rfl: 1   Polyethylene Glycol 3350 (MIRALAX PO), Take 17 g by mouth daily., Disp: , Rfl:    pregabalin (LYRICA) 150 MG capsule, Take 1 capsule (150 mg total) by mouth 2 (two) times daily., Disp: 90 capsule, Rfl: 5   ranolazine (RANEXA) 1000 MG SR tablet, Take 1 tablet (1,000 mg total) by mouth 2 (two) times daily., Disp: 180 tablet, Rfl: 1   sertraline (ZOLOFT) 100 MG tablet, Take 2 tablets (200 mg total) by mouth at bedtime., Disp: 180 tablet, Rfl: 1   Sodium Chloride Flush (NORMAL SALINE FLUSH) 0.9 % SOLN, Flush drain with 5 mLs daily. Discard remaining 5ml in each syringe., Disp: 200 mL, Rfl: 0   traZODone (DESYREL) 50 MG tablet, TAKE 1 TABLET(50 MG) BY MOUTH AT BEDTIME AS NEEDED FOR SLEEP. MAY TAKE 1/2 TABLET TO 1 TABLET FOR SLEEP, AS NEEDED (Patient taking differently: Take 50 mg by mouth at bedtime as needed for sleep.), Disp: 30 tablet, Rfl: 3   traZODone (DESYREL) 50 MG tablet, Take 1  tablet (50 mg total) by mouth at bedtime., Disp: 90 tablet, Rfl: 1   vitamin B-12 (CYANOCOBALAMIN) 1000 MCG tablet, Take 1,000 mcg by mouth daily., Disp: , Rfl:    zolpidem (AMBIEN) 5 MG tablet, Take 1 tablet (5 mg total) by mouth at bedtime as needed for sleep., Disp: 30 tablet, Rfl: 5   umeclidinium-vilanterol (ANORO ELLIPTA) 62.5-25 MCG/ACT AEPB, Inhale 1 puff into the lungs daily. (Patient not taking: Reported on 11/25/2022), Disp: 60 each, Rfl: 0      Objective:   Vitals:   11/25/22 1351  BP: 122/74  Pulse: (!) 101  Temp: (!) 97.2 F (36.2 C)  TempSrc: Temporal  SpO2: 97%  Weight: 191 lb (86.6 kg)  Height: 5\' 11"  (1.803 m)    Estimated body mass index is 26.64 kg/m as calculated from the following:   Height as of this encounter: 5\' 11"  (1.803 m).   Weight as of this encounter: 191 lb (86.6 kg).  @WEIGHTCHANGE @  [employer]   11/25/22 1351  Weight: 191 lb (86.6 kg)     Physical Exam   General: No distress. Looks bett O2 at rest: yes Gilmer Mor present: yes Sitting in wheel chair: no Frail: no Obese: no Neuro: Alert and Oriented x 3. GCS 15. Speech normal Psych: Pleasant Resp:  Barrel Chest - no.  Wheeze - no, Crackles - yes, No overt respiratory distress CVS: Normal heart sounds. Murmurs - no Ext: Stigmata of Connective Tissue Disease - no HEENT: Normal upper airway. PEERL +. No post nasal drip        Assessment:       ICD-10-CM   1. IPF (idiopathic pulmonary fibrosis) (HCC)  J84.112 D-dimer, quantitative    B Nat Peptide    CBC with Differential/Platelet    Basic metabolic panel    2. Medication monitoring encounter  Z51.81 D-dimer, quantitative    B  Nat Peptide    CBC with Differential/Platelet    Basic metabolic panel    3. History of pulmonary embolism  Z86.711 D-dimer, quantitative    B Nat Peptide    CBC with Differential/Platelet    Basic metabolic panel    4. History of acute cholecystitis  Z87.19 D-dimer, quantitative    B Nat Peptide     CBC with Differential/Platelet    Basic metabolic panel    5. Exercise hypoxemia  R09.02          Plan:     Patient Instructions  IPF (idiopathic pulmonary fibrosis) (HCC) Medication monitoring encounter  -Pulmonary fibrosis itself appears to be stable since OCt 2023/April 2023 based on pulmonary function test and symptoms but declined since dec 2023  - Mild exercise hypoxemia 11/25/2022 reflect post hospitalization versus mild gradual chronic decline in IPF   Overall tolerating Esbriet well.    LFT normal Lang 2024  plan - continue esbriet per schedule -check LFT 11/25/2022 - continue night o2  - CMA to do order for portable o2 11/25/2022 with [employer]  Pulmonary embolism small June 2024 requirng o2 in hospital  - on 11/25/2022  - using o2 at night and only very mild exercise hypxoemia  Plan  -cotninue eliquis; duration likely 1+ years - check d-dimer, bnp, cbc, bmet 11/25/2022   Acute cholecystisis s/p drain  - sil with drain  Plan   - if blood work ok, now that 3 months since blood clot ok to have cholecystectomy  - let surgeon know  -Follow-up - 3 months Dr Marchelle Gearing; symptom score and walk at followup  -30-minute visit   FOLLOWUP Return in about 3 months (around 02/24/2023) for 30 min visit, with Dr Marchelle Gearing, ILD, Face to Face Visit.    SIGNATURE    Dr. Kalman Shan, M.D., F.C.C.P,  Pulmonary and Critical Care Medicine Staff Physician, [facility] Center Director - Interstitial Lung Disease  Program  Pulmonary Fibrosis [facility] at [facility] [employer], [place], [address]  Pager: [phone], If no answer or between  15:00h - 7:00h: call [phone] Telephone: [phone]  2:45 PM 11/25/2022   Moderate Complexity MDM OFFICE  2021 E/M guidelines, first released in 2021, with minor revisions added in 2023 and 2024 Must meet the requirements for 2 out of 3 dimensions to qualify.    Number and complexity of  problems addressed Amount and/or complexity of data reviewed Risk of complications and/or morbidity  One or more chronic illness with mild exacerbation, OR progression, OR  side effects of treatment  Two or more stable chronic illnesses  One undiagnosed new problem with uncertain prognosis  One acute illness with systemic symptoms   One Acute complicated injury Must meet the requirements for 1 of 3 of the categories)  Category 1: Tests and documents, historian  Any combination of 3 of the following:  Assessment requiring an independent historian  Review of prior external note(s) from each unique source  Review of results of each unique test  Ordering of each unique test    Category 2: Interpretation of tests   Independent interpretation of a test performed by another physician/other qualified health care professional (not separately reported)  Category 3: Discuss management/tests  Discussion of management or test interpretation with external physician/other qualified health care professional/appropriate source (not separately reported) Moderate risk of morbidity from additional diagnostic testing or treatment Examples only:  Prescription drug management  Decision regarding  minor surgery with identfied patient or procedure risk factors  Decision regarding elective major surgery without identified patient or procedure risk factors  Diagnosis or treatment significantly limited by social determinants of health             HIGh Complexity  OFFICE   2021 E/M guidelines, first released in 2021, with minor revisions added in 2023. Must meet the requirements for 2 out of 3 dimensions to qualify.    Number and complexity of problems addressed Amount and/or complexity of data reviewed Risk of complications and/or morbidity  Severe exacerbation of chronic illness  Acute or chronic illnesses that may pose a threat to life or bodily function, e.g., multiple trauma, acute  MI, pulmonary embolus, severe respiratory distress, progressive rheumatoid arthritis, psychiatric illness with potential threat to self or others, peritonitis, acute renal failure, abrupt change in neurological status Must meet the requirements for 2 of 3 of the categories)  Category 1: Tests and documents, historian  Any combination of 3 of the following:  Assessment requiring an independent historian  Review of prior external note(s) from each unique source  Review of results of each unique test  Ordering of each unique test    Category 2: Interpretation of tests    Independent interpretation of a test performed by another physician/other qualified health care professional (not separately reported)  Category 3: Discuss management/tests  Discussion of management or test interpretation with external physician/other qualified health care professional/appropriate source (not separately reported)  HIGH risk of morbidity from additional diagnostic testing or treatment Examples only:  Drug therapy requiring intensive monitoring for toxicity  Decision for elective major surgery with identified pateint or procedure risk factors  Decision regarding hospitalization or escalation of level of care  Decision for DNR or to de-escalate care   Parenteral controlled  substances            LEGEND - Independent interpretation involves the interpretation of a test for which there is a CPT code, and an interpretation or report is customary. When a review and interpretation of a test is performed and documented by the provider, but not separately reported (billed), then this would represent an independent interpretation. This report does not need to conform to the usual standards of a complete report of the test. This does not include interpretation of tests that do not have formal reports such as a complete blood count with differential and blood cultures. Examples would include  reviewing a chest radiograph and documenting in the medical record an interpretation, but not separately reporting (billing) the interpretation of the chest radiograph.   An appropriate source includes professionals who are not health care professionals but may be involved in the management of the patient, such as a Clinical research associate, upper officer, case manager or teacher, and does not include discussion with family or informal caregivers.    - SDOH: SDOH are the conditions in the environments where people are born, live, learn, work, play, worship, and age that affect a wide range of health, functioning, and quality-of-life outcomes and risks. (e.g., housing, food insecurity, transportation, etc.). SDOH-related Z codes ranging from Z55-Z65 are the ICD-10-CM diagnosis codes used to document SDOH data Z55 - Problems related to education and literacy Z56 - Problems related to employment and unemployment Z57 - Occupational exposure to risk factors Z58 - Problems related to physical environment Z59 - Problems related to housing and economic circumstances [phone] - Problems related to social environment [phone] - Problems related to upbringing [phone] - Other  problems related to primary support group, including family circumstances Z48 - Problems related to certain psychosocial circumstances Z65 - Problems related to other psychosocial circumstances

## 2022-11-29 ENCOUNTER — Telehealth: Payer: Self-pay | Admitting: Cardiology

## 2022-11-29 ENCOUNTER — Telehealth: Payer: Self-pay | Admitting: Internal Medicine

## 2022-11-29 DIAGNOSIS — I4892 Unspecified atrial flutter: Secondary | ICD-10-CM | POA: Diagnosis not present

## 2022-11-29 DIAGNOSIS — I2693 Single subsegmental pulmonary embolism without acute cor pulmonale: Secondary | ICD-10-CM | POA: Diagnosis not present

## 2022-11-29 DIAGNOSIS — I10 Essential (primary) hypertension: Secondary | ICD-10-CM | POA: Diagnosis not present

## 2022-11-29 DIAGNOSIS — I252 Old myocardial infarction: Secondary | ICD-10-CM | POA: Diagnosis not present

## 2022-11-29 DIAGNOSIS — J432 Centrilobular emphysema: Secondary | ICD-10-CM | POA: Diagnosis not present

## 2022-11-29 DIAGNOSIS — F1010 Alcohol abuse, uncomplicated: Secondary | ICD-10-CM | POA: Diagnosis not present

## 2022-11-29 DIAGNOSIS — J9601 Acute respiratory failure with hypoxia: Secondary | ICD-10-CM | POA: Diagnosis not present

## 2022-11-29 DIAGNOSIS — J690 Pneumonitis due to inhalation of food and vomit: Secondary | ICD-10-CM | POA: Diagnosis not present

## 2022-11-29 DIAGNOSIS — G4733 Obstructive sleep apnea (adult) (pediatric): Secondary | ICD-10-CM | POA: Diagnosis not present

## 2022-11-29 DIAGNOSIS — E7800 Pure hypercholesterolemia, unspecified: Secondary | ICD-10-CM | POA: Diagnosis not present

## 2022-11-29 DIAGNOSIS — I452 Bifascicular block: Secondary | ICD-10-CM | POA: Diagnosis not present

## 2022-11-29 DIAGNOSIS — G629 Polyneuropathy, unspecified: Secondary | ICD-10-CM | POA: Diagnosis not present

## 2022-11-29 DIAGNOSIS — I330 Acute and subacute infective endocarditis: Secondary | ICD-10-CM | POA: Diagnosis not present

## 2022-11-29 DIAGNOSIS — J84112 Idiopathic pulmonary fibrosis: Secondary | ICD-10-CM | POA: Diagnosis not present

## 2022-11-29 DIAGNOSIS — I2510 Atherosclerotic heart disease of native coronary artery without angina pectoris: Secondary | ICD-10-CM | POA: Diagnosis not present

## 2022-11-29 DIAGNOSIS — F411 Generalized anxiety disorder: Secondary | ICD-10-CM | POA: Diagnosis not present

## 2022-11-29 NOTE — Telephone Encounter (Signed)
Pt wife called in and wanted to see if Dr Kirtland Bouchard gave the ok per Echo for pt to have the surgery.  She wanted to know if pt is going to need an appt.  The pt is trying to hurry and get this surgery asap   Best number [phone]

## 2022-11-29 NOTE — Telephone Encounter (Signed)
PT's wife  calling to check on lab results. These results will determine if we can do a surgical clearance for the PT. Please [phone]

## 2022-11-29 NOTE — Telephone Encounter (Signed)
Spoke with patient and advised. He wrote down instructions. Nothing further needed at this time.

## 2022-11-29 NOTE — Telephone Encounter (Signed)
Left vm to call back

## 2022-11-29 NOTE — Telephone Encounter (Signed)
   D-dimer still high   Plan  - ok to have surgery but hold eliquis 2d before surgery and start within 2d after surgery if that works for surgeon    LABS    Latest Reference Range & Units 11/25/22 14:49  D-Dimer, Quant <0.50 mcg/mL FEU 0.74 (H)  (H): Data is abnormally high  PULMONARY No results for input(s): "PHART", "PCO2ART", "PO2ART", "HCO3", "TCO2", "O2SAT" in the last 168 hours.  Invalid input(s): "PCO2", "PO2"  CBC Recent Labs  Lab 11/25/22 1449  HGB 14.7  HCT 45.5  WBC 8.3  PLT 267.0    COAGULATION No results for input(s): "INR" in the last 168 hours.  CARDIAC  No results for input(s): "TROPONINI" in the last 168 hours. Recent Labs  Lab 11/25/22 1449  PROBNP 36.0     CHEMISTRY Recent Labs  Lab 11/25/22 1449  NA 135  K 4.6  CL 102  CO2 27  GLUCOSE 114*  BUN 14  CREATININE 0.98  CALCIUM 9.4   Estimated Creatinine Clearance: 72.6 mL/min (by C-G formula based on SCr of 0.98 mg/dL).   LIVER No results for input(s): "AST", "ALT", "ALKPHOS", "BILITOT", "PROT", "ALBUMIN", "INR" in the last 168 hours.   INFECTIOUS No results for input(s): "LATICACIDVEN", "PROCALCITON" in the last 168 hours.   ENDOCRINE CBG (last 3)  No results for input(s): "GLUCAP" in the last 72 hours.       IMAGING x48h  - image(s) personally visualized  -   highlighted in bold No results found.

## 2022-11-30 ENCOUNTER — Ambulatory Visit: Payer: Medicare Other | Admitting: Internal Medicine

## 2022-11-30 DIAGNOSIS — E7800 Pure hypercholesterolemia, unspecified: Secondary | ICD-10-CM | POA: Diagnosis not present

## 2022-11-30 DIAGNOSIS — J84112 Idiopathic pulmonary fibrosis: Secondary | ICD-10-CM | POA: Diagnosis not present

## 2022-11-30 DIAGNOSIS — J9601 Acute respiratory failure with hypoxia: Secondary | ICD-10-CM | POA: Diagnosis not present

## 2022-11-30 DIAGNOSIS — G629 Polyneuropathy, unspecified: Secondary | ICD-10-CM | POA: Diagnosis not present

## 2022-11-30 DIAGNOSIS — F411 Generalized anxiety disorder: Secondary | ICD-10-CM | POA: Diagnosis not present

## 2022-11-30 DIAGNOSIS — F1010 Alcohol abuse, uncomplicated: Secondary | ICD-10-CM | POA: Diagnosis not present

## 2022-11-30 DIAGNOSIS — I4892 Unspecified atrial flutter: Secondary | ICD-10-CM | POA: Diagnosis not present

## 2022-11-30 DIAGNOSIS — I2693 Single subsegmental pulmonary embolism without acute cor pulmonale: Secondary | ICD-10-CM | POA: Diagnosis not present

## 2022-11-30 DIAGNOSIS — J690 Pneumonitis due to inhalation of food and vomit: Secondary | ICD-10-CM | POA: Diagnosis not present

## 2022-11-30 DIAGNOSIS — I2510 Atherosclerotic heart disease of native coronary artery without angina pectoris: Secondary | ICD-10-CM | POA: Diagnosis not present

## 2022-11-30 DIAGNOSIS — I330 Acute and subacute infective endocarditis: Secondary | ICD-10-CM | POA: Diagnosis not present

## 2022-11-30 DIAGNOSIS — J432 Centrilobular emphysema: Secondary | ICD-10-CM | POA: Diagnosis not present

## 2022-11-30 DIAGNOSIS — I452 Bifascicular block: Secondary | ICD-10-CM | POA: Diagnosis not present

## 2022-11-30 DIAGNOSIS — I252 Old myocardial infarction: Secondary | ICD-10-CM | POA: Diagnosis not present

## 2022-11-30 DIAGNOSIS — I10 Essential (primary) hypertension: Secondary | ICD-10-CM | POA: Diagnosis not present

## 2022-11-30 DIAGNOSIS — G4733 Obstructive sleep apnea (adult) (pediatric): Secondary | ICD-10-CM | POA: Diagnosis not present

## 2022-12-01 ENCOUNTER — Encounter: Payer: Self-pay | Admitting: Cardiology

## 2022-12-01 ENCOUNTER — Ambulatory Visit: Payer: Medicare Other | Attending: Cardiology | Admitting: Cardiology

## 2022-12-01 VITALS — BP 110/66 | HR 92 | Ht 71.0 in | Wt 189.0 lb

## 2022-12-01 DIAGNOSIS — I480 Paroxysmal atrial fibrillation: Secondary | ICD-10-CM

## 2022-12-01 DIAGNOSIS — I2510 Atherosclerotic heart disease of native coronary artery without angina pectoris: Secondary | ICD-10-CM

## 2022-12-01 DIAGNOSIS — J432 Centrilobular emphysema: Secondary | ICD-10-CM

## 2022-12-01 DIAGNOSIS — I10 Essential (primary) hypertension: Secondary | ICD-10-CM | POA: Diagnosis not present

## 2022-12-01 DIAGNOSIS — Z01818 Encounter for other preprocedural examination: Secondary | ICD-10-CM

## 2022-12-01 NOTE — Progress Notes (Signed)
Cardiology Office Note:    Date:  12/01/2022   ID:  Robert Lang July, DOB [birth year], MRN [id number]  PCP:  [name], [name]  Cardiologist:  Gypsy Balsam, MD    Referring MD: Marisue Brooklyn   Chief Complaint  Patient presents with   Medical Clearance    Dr. Jewel Baize Surgery    History of Present Illness:    Robert Lang is a 72 y.o. male  with complex past medical history that in clued coronary disease, status post PTCA to LAD and RCA, atrial flutter status post ablation 2017, essential hypertension, dyslipidemia. Last cardiac catheterization done in [facility] in 2017 showed patent stent in proximal LAD, there was 90% occlusion of the small first diagonal branch, 25% of RCA ejection fraction was good at that time. Last intervention performed was on 01/10/2019 with PTCA and stenting of the right coronary artery. Recently he ended up having pulmonary emboli in June put on anticoagulation, he was also found to have acute cholecystitis.  Comes today to my office for evaluation before surgery feels miserable with the drain he is tired exhausted does not want to have drain anymore.  He did have echocardiogram done which did not show any significant pathology, denies have any chest pain tightness squeezing pressure burning chest shortness of breath is there like all the time he is able to exercise is limited.  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 M75.00)   Allergic rhinitis due to pollen 09/03/2013   J30.1)  Formatting of this note might be different from the original. J30.1)   Allergy    Anticoagulated 07/01/2016   Anxiety    Arthritis    Atrial fibrillation (HCC)    Atrial flutter (HCC) 06/26/2015   CAD (coronary artery disease)    Chest pain  06/26/2015   COPD (chronic obstructive pulmonary disease) (HCC)    Coronary artery disease 07/06/2018   Cardiac catheterization 2017 showing 90% small diagonal branch disease   DDD (degenerative disc disease), cervical 09/03/2013   M50.90)  Formatting of this note might be different from the original. M50.90)   Depression    Depression    Depressive disorder 09/03/2013   Dizziness 10/28/2017   Dyslipidemia, goal LDL below 70 07/06/2018   Dyspnea on exertion 10/20/2018   Esophageal reflux 09/03/2013   Essential hypertension 07/06/2018   ETOH abuse 01/11/2019   6 pack of beer per day   Falls 10/28/2017   GERD (gastroesophageal reflux disease)    H/O amiodarone therapy 07/01/2016   Heart attack (HCC)    Heart palpitations 01/27/2017   History of colon polyps    Hyperlipidemia    Hypertension    IPF (idiopathic pulmonary fibrosis) (HCC)    Kidney stones    Neck pain 09/03/2013   Neuropathy 07/06/2018   Obstructive sleep apnea 08/05/2015   PLMD (periodic limb movement disorder) 11/04/2015   Polycythemia, 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 STORY: Due to alcohol/ tobacco   Pure hypercholesterolemia 09/03/2013   E78.0)  Formatting of this note might be different from the original. E78.0)   Screening for prostate cancer 09/03/2013   Smoker 01/11/2019   Smoking 07/06/2018   Status post ablation of atrial flutter 07/06/2018  2017    Past Surgical History:  Procedure Laterality Date   ATRIAL FIBRILLATION ABLATION  10/2015   BACK SURGERY     CARDIOVERSION  2017   CATARACT EXTRACTION Bilateral 2017   March and April 2017   COLONOSCOPY  2018   CORONARY STENT PLACEMENT  2012   CORONARY/GRAFT ACUTE MI REVASCULARIZATION N/A 01/10/2019   Procedure: CORONARY/GRAFT ACUTE MI REVASCULARIZATION;  Surgeon: Marykay Lex, MD;  Location: [facility] INVASIVE CV LAB;  Service: Cardiovascular;  Laterality: N/A;    INGUINAL HERNIA REPAIR     over 20 years ago   IR CHOLANGIOGRAM EXISTING TUBE  09/16/2022   IR EXCHANGE BILIARY DRAIN  10/06/2022   IR PERC CHOLECYSTOSTOMY  08/30/2022   LAPAROSCOPIC APPENDECTOMY N/A 01/17/2021   Procedure: APPENDECTOMY LAPAROSCOPIC;  Surgeon: Quentin Ore, MD;  Location: [facility] OR;  Service: General;  Laterality: N/A;   LEFT HEART CATH AND CORONARY ANGIOGRAPHY N/A 01/10/2019   Procedure: LEFT HEART CATH AND CORONARY ANGIOGRAPHY;  Surgeon: Marykay Lex, MD;  Location: [facility] INVASIVE CV LAB;  Service: Cardiovascular;  Laterality: N/A;   LUMBAR LAMINECTOMY Bilateral 04/05/2016   L2-L5    VENTRAL HERNIA REPAIR  2018    Current Medications: Current Meds  Medication Sig   acetaminophen (TYLENOL) 500 MG tablet Take 1 tablet (500 mg total) by mouth every 8 (eight) hours as needed for moderate pain.   apixaban (ELIQUIS) 5 MG TABS tablet Take 1 tablet (5 mg total) by mouth 2 (two) times daily.   atorvastatin (LIPITOR) 80 MG tablet Take 1 tablet (80 mg total) by mouth daily. (Patient taking differently: Take 80 mg by mouth every evening.)   buPROPion (WELLBUTRIN XL) 300 MG 24 hr tablet Take 1 tablet (300 mg total) by mouth daily.   busPIRone (BUSPAR) 15 MG tablet Take 1 tablet (15 mg total) by mouth 3 (three) times daily.   Cholecalciferol (VITAMIN D3) 25 MCG (1000 UT) CAPS Take 2 capsules by mouth 2 (two) times daily.   docusate sodium (COLACE) 100 MG capsule Take 100 mg by mouth daily.   famotidine (PEPCID) 40 MG tablet TAKE 1 TABLET(40 MG) BY MOUTH AT BEDTIME (Patient taking differently: Take 40 mg by mouth at bedtime.)   fexofenadine (ALLEGRA) 180 MG tablet Take 180 mg by mouth at bedtime.    finasteride (PROSCAR) 5 MG tablet Take 1 tablet (5 mg total) by mouth daily.   lamoTRIgine (LAMICTAL) 100 MG tablet Take 1 tablet (100 mg total) by mouth daily.   levalbuterol (XOPENEX) 1.25 MG/0.5ML nebulizer solution Take 1.25 mg by nebulization every 4 (four) hours as needed for  wheezing or shortness of breath.   LORazepam (ATIVAN) 0.5 MG tablet Take one tablet by mouth only for severe anxiety or agitation (Patient taking differently: Take 0.5 mg by mouth daily as needed for anxiety. Take one tablet by mouth only for severe anxiety or agitation. Has not taking medication yet.)   Melatonin 10 MG TABS Take 1 tablet by mouth at bedtime.   MUCINEX 600 MG 12 hr tablet TAKE 2 TABLETS BY MOUTH TWICE DAILY   Multiple Vitamins-Minerals (PRESERVISION AREDS PO) Take 1 capsule by mouth in the morning and at bedtime.   nitroGLYCERIN (NITROSTAT) 0.4 MG SL tablet Place 1 tablet (0.4 mg total) under the tongue every 5 (five) minutes as needed for chest pain.   ondansetron (ZOFRAN) 4 MG tablet Take 1 tablet (4 mg total) by mouth every 8 (eight) hours as needed for nausea or vomiting.   pantoprazole (  PROTONIX) 40 MG tablet TAKE 1 TABLET(40 MG) BY MOUTH DAILY (Patient taking differently: Take 40 mg by mouth daily.)   Pirfenidone (ESBRIET) 801 MG TABS Take 1 tablet (801 mg total) by mouth 3 (three) times daily.   Polyethylene Glycol 3350 (MIRALAX PO) Take 17 g by mouth daily.   pregabalin (LYRICA) 150 MG capsule Take 1 capsule (150 mg total) by mouth 2 (two) times daily.   ranolazine (RANEXA) 1000 MG SR tablet Take 1 tablet (1,000 mg total) by mouth 2 (two) times daily.   sertraline (ZOLOFT) 100 MG tablet Take 2 tablets (200 mg total) by mouth at bedtime.   Sodium Chloride Flush (NORMAL SALINE FLUSH) 0.9 % SOLN Flush drain with 5 mLs daily. Discard remaining 5ml in each syringe.   traZODone (DESYREL) 50 MG tablet TAKE 1 TABLET(50 MG) BY MOUTH AT BEDTIME AS NEEDED FOR SLEEP. MAY TAKE 1/2 TABLET TO 1 TABLET FOR SLEEP, AS NEEDED (Patient taking differently: Take 50 mg by mouth at bedtime as needed for sleep.)   traZODone (DESYREL) 50 MG tablet Take 1 tablet (50 mg total) by mouth at bedtime.   vitamin B-12 (CYANOCOBALAMIN) 1000 MCG tablet Take 1,000 mcg by mouth daily.   zolpidem (AMBIEN) 5 MG  tablet Take 1 tablet (5 mg total) by mouth at bedtime as needed for sleep.   [DISCONTINUED] umeclidinium-vilanterol (ANORO ELLIPTA) 62.5-25 MCG/ACT AEPB Inhale 1 puff into the lungs daily.     Allergies:   Naproxen and Silodosin   Social History   Socioeconomic History   Marital status: Married    Spouse name: Not on file   Number of children: Not on file   Years of education: Not on file   Highest education level: Not on file  Occupational History   Not on file  Tobacco Use   Smoking status: Former    Current packs/day: 0.00    Average packs/day: 1 pack/day for 30.0 years (30.0 ttl pk-yrs)    Types: Cigarettes    Start date: 01/09/1989    Quit date: 01/10/2019    Years since quitting: 3.8   Smokeless tobacco: Former  Building services engineer status: Never Used  Substance and Sexual Activity   Alcohol use: Yes    Comment: 8oz of wine a day   Drug use: Never   Sexual activity: Not on file  Other Topics Concern   Not on file  Social History Narrative   Not on file   Social Determinants of Health   Financial Resource Strain: Low Risk  (12/22/2021)   Overall Financial Resource Strain (CARDIA)    Difficulty of Paying Living Expenses: Not hard at all  Food Insecurity: No Food Insecurity (10/06/2022)   Hunger Vital Sign    Worried About Running Out of Food in the Last Year: Never true    Ran Out of Food in the Last Year: Never true  Transportation Needs: No Transportation Needs (10/06/2022)   PRAPARE - Administrator, Civil Service (Medical): No    Lack of Transportation (Non-Medical): No  Physical Activity: Inactive (12/22/2021)   Exercise Vital Sign    Days of Exercise per Week: 0 days    Minutes of Exercise per Session: 0 min  Stress: No Stress Concern Present (12/22/2021)   [employer] of Occupational Health - Occupational Stress Questionnaire    Feeling of Stress : Not at all  Social Connections: Moderately Integrated (12/22/2021)   Social Connection and  Isolation Panel [NHANES]    Frequency  of Communication with Friends and Family: More than three times a week    Frequency of Social Gatherings with Friends and Family: More than three times a week    Attends Religious Services: More than 4 times per year    Active Member of [employer] or Organizations: No    Attends Banker Meetings: Never    Marital Status: Married     Family History: The patient's family history includes Alcohol abuse in his brother and father; Anxiety disorder in his mother; Colon cancer in his father; Depression in his brother; Throat cancer in his brother. ROS:   Please see the history of present illness.    All 14 point review of systems negative except as described per history of present illness  EKGs/Labs/Other Studies Reviewed:         Recent Labs: 10/09/2022: ALT 16; B Natriuretic Peptide 330.7; Magnesium 2.0 11/25/2022: BUN 14; Creatinine, Ser 0.98; Hemoglobin 14.7; Platelets 267.0; Potassium 4.6; Pro B Natriuretic peptide (BNP) 36.0; Sodium 135  Recent Lipid Panel    Component Value Date/Time   CHOL 144 12/30/2021 0944   CHOL 128 04/03/2020 0951   TRIG 342.0 (H) 12/30/2021 0944   HDL 25.00 (L) 12/30/2021 0944   HDL 40 04/03/2020 0951   CHOLHDL 6 12/30/2021 0944   VLDL 68.4 (H) 12/30/2021 0944   LDLCALC 58 04/03/2020 0951   LDLDIRECT 80.0 12/30/2021 0944    Physical Exam:    VS:  BP 110/66 (BP Location: Left Arm, Patient Position: Sitting, Cuff Size: Small)   Pulse 92   Ht 5\' 11"  (1.803 m)   Wt 189 lb (85.7 kg)   SpO2 94%   BMI 26.36 kg/m     Wt Readings from Last 3 Encounters:  12/01/22 189 lb (85.7 kg)  11/25/22 191 lb (86.6 kg)  10/27/22 189 lb 8 oz (86 kg)     GEN:  Well nourished, well developed in no acute distress HEENT: Normal NECK: No JVD; No carotid bruits LYMPHATICS: No lymphadenopathy CARDIAC: RRR, no murmurs, no rubs, no gallops RESPIRATORY:  Clear to auscultation without rales, wheezing or rhonchi  ABDOMEN:  Soft, non-tender, non-distended MUSCULOSKELETAL:  No edema; No deformity  SKIN: Warm and dry LOWER EXTREMITIES: no swelling NEUROLOGIC:  Alert and oriented x 3 PSYCHIATRIC:  Normal affect   ASSESSMENT:    1. Coronary artery disease involving native coronary artery of native heart without angina pectoris   2. Essential hypertension   3. Paroxysmal atrial fibrillation (HCC)   4. Centrilobular emphysema (HCC)    PLAN:    In order of problems listed above:  Cardiovascular preop evaluation for potential cholecystectomy.  He got drain in his gallbladder and he hide that he wants to have surgery done today.  Echocardiogram reviewed looks fine.  No recent evaluation for his heart therefore I will schedule him to have Lexiscan if Lexiscan is negative then we will be able to proceed.  He also got recent history of PE therefore his anticoagulation need to be withdrawn for 24 to 48 hours before surgery and resume as quickly as feasible from surgical point of view.  He does understand there are some high risk of quickly proceeding to this surgery that being already 3 months after his PE.  But he is determined that he would like to have it out if not he said he will take it out by himself. Essential hypertension blood pressure well-controlled continue present management. Paroxysmal atrial fibrillation.  Doing well maintained sinus rhythm today.  Irregular on the physical exam. Emphysema.  Stable from that point review.  He is on oxygen all the time   Medication Adjustments/Labs and Tests Ordered: Current medicines are reviewed at length with the patient today.  Concerns regarding medicines are outlined above.  No orders of the defined types were placed in this encounter.  Medication changes: No orders of the defined types were placed in this encounter.   Signed, Georgeanna Lea, MD, [facility] 12/01/2022 9:06 AM    [facility]

## 2022-12-01 NOTE — Patient Instructions (Signed)
Medication Instructions:  ?Your physician recommends that you continue on your current medications as directed. Please refer to the Current Medication list given to you today.  ?*If you need a refill on your cardiac medications before your next appointment, please call your pharmacy* ? ? ?Lab Work: ?None Ordered ?If you have labs (blood work) drawn today and your tests are completely normal, you will receive your results only by: ?MyChart Message (if you have MyChart) OR ?A paper copy in the mail ?If you have any lab test that is abnormal or we need to change your treatment, we will call you to review the results. ? ? ?Testing/Procedures: ?Your physician has requested that you have a lexiscan myoview. For further information please visit [email or web address]. Please follow instruction sheet, as given. ? ?The test will take approximately 3 to 4 hours to complete; you may bring reading material.  If someone comes with you to your appointment, they will need to remain in the main lobby due to limited space in the testing area. ? ?How to prepare for your Myocardial Perfusion Test: ?Do not eat or drink 3 hours prior to your test, except you may have water. ?Do not consume products containing caffeine (regular or decaffeinated) 12 hours prior to your test. (ex: coffee, chocolate, sodas, tea). ?Do bring a list of your current medications with you.  If not listed below, you may take your medications as normal. ?Do wear comfortable clothes (no dresses or overalls) and walking shoes, tennis shoes preferred (No heels or open toe shoes are allowed). ?Do NOT wear cologne, perfume, aftershave, or lotions (deodorant is allowed). ?If these instructions are not followed, your test will have to be rescheduled.   ? ? ?Follow-Up: ?At [facility], you and your health needs are our priority.  As part of our continuing mission to provide you with exceptional heart care, we have created designated Provider Care Teams.  These Care Teams  include your primary Cardiologist (physician) and Advanced Practice Providers (APPs -  Physician Assistants and Nurse Practitioners) who all work together to provide you with the care you need, when you need it. ? ?We recommend signing up for the patient portal called "MyChart".  Sign up information is provided on this After Visit Summary.  MyChart is used to connect with patients for Virtual Visits (Telemedicine).  Patients are able to view lab/test results, encounter notes, upcoming appointments, etc.  Non-urgent messages can be sent to your provider as well.   ?To learn more about what you can do with MyChart, go to [email or web address].   ? ?Your next appointment:   ?3 month(s) ? ?The format for your next appointment:   ?In Person ? ?Provider:   ?Jenne Campus, MD  ? ? ?Other Instructions ?NA  ?

## 2022-12-02 ENCOUNTER — Ambulatory Visit (HOSPITAL_COMMUNITY): Payer: Medicare Other | Attending: Cardiology

## 2022-12-02 DIAGNOSIS — I2510 Atherosclerotic heart disease of native coronary artery without angina pectoris: Secondary | ICD-10-CM | POA: Diagnosis not present

## 2022-12-02 DIAGNOSIS — Z01818 Encounter for other preprocedural examination: Secondary | ICD-10-CM | POA: Diagnosis not present

## 2022-12-02 LAB — MYOCARDIAL PERFUSION IMAGING
LV dias vol: 70 mL (ref 62–150)
LV sys vol: 22 mL
Nuc Stress EF: 68 %
Peak HR: 97 {beats}/min
Rest HR: 81 {beats}/min
Rest Nuclear Isotope Dose: 9.8 mCi
SDS: 2
SRS: 0
SSS: 2
ST Depression (mm): 0 mm
Stress Nuclear Isotope Dose: 31.7 mCi
TID: 0.87

## 2022-12-02 MED ORDER — REGADENOSON 0.4 MG/5ML IV SOLN
0.4000 mg | Freq: Once | INTRAVENOUS | Status: AC
Start: 2022-12-02 — End: ?

## 2022-12-02 MED ORDER — TECHNETIUM TC 99M TETROFOSMIN IV KIT
9.8000 | PACK | Freq: Once | INTRAVENOUS | Status: AC | PRN
  Administered 2022-12-02: 9.8 via INTRAVENOUS

## 2022-12-02 MED ORDER — TECHNETIUM TC 99M TETROFOSMIN IV KIT: 31.7000 | PACK | Freq: Once | INTRAVENOUS | Status: AC | PRN

## 2022-12-03 ENCOUNTER — Telehealth: Payer: Self-pay

## 2022-12-03 NOTE — Telephone Encounter (Signed)
Patient Name: Robert Lang  DOB: April 04, 1950 MRN: [id number]  Primary Cardiologist: Gypsy Balsam, MD  Chart reviewed as part of pre-operative protocol coverage. Patient was last seen in the office on 12/01/2022 by Dr. Bing Matter. Recent echocardiogram and stress test were low risk. Therefore, given past medical history and time since last visit, based on ACC/AHA guidelines, Robert Lang is at acceptable risk for the planned procedure without further cardiovascular testing.    I will route this recommendation as well as the note from most recent OV with Dr. Bing Matter to the requesting party via Epic fax function and remove from pre-op pool.  Please call with questions.  Joylene Grapes, NP 12/03/2022, 10:48 AM

## 2022-12-03 NOTE — Telephone Encounter (Signed)
Patient notified through my chart.

## 2022-12-03 NOTE — Telephone Encounter (Signed)
-----   Message from Gypsy Balsam sent at 12/03/2022 12:22 PM EDT ----- Stress test was normal

## 2022-12-05 DIAGNOSIS — G4733 Obstructive sleep apnea (adult) (pediatric): Secondary | ICD-10-CM | POA: Diagnosis not present

## 2022-12-05 DIAGNOSIS — J690 Pneumonitis due to inhalation of food and vomit: Secondary | ICD-10-CM | POA: Diagnosis not present

## 2022-12-05 DIAGNOSIS — I2693 Single subsegmental pulmonary embolism without acute cor pulmonale: Secondary | ICD-10-CM | POA: Diagnosis not present

## 2022-12-05 DIAGNOSIS — I330 Acute and subacute infective endocarditis: Secondary | ICD-10-CM | POA: Diagnosis not present

## 2022-12-05 DIAGNOSIS — I252 Old myocardial infarction: Secondary | ICD-10-CM | POA: Diagnosis not present

## 2022-12-05 DIAGNOSIS — E7800 Pure hypercholesterolemia, unspecified: Secondary | ICD-10-CM | POA: Diagnosis not present

## 2022-12-05 DIAGNOSIS — J432 Centrilobular emphysema: Secondary | ICD-10-CM | POA: Diagnosis not present

## 2022-12-05 DIAGNOSIS — J84112 Idiopathic pulmonary fibrosis: Secondary | ICD-10-CM | POA: Diagnosis not present

## 2022-12-05 DIAGNOSIS — I452 Bifascicular block: Secondary | ICD-10-CM | POA: Diagnosis not present

## 2022-12-05 DIAGNOSIS — I4892 Unspecified atrial flutter: Secondary | ICD-10-CM | POA: Diagnosis not present

## 2022-12-05 DIAGNOSIS — I2510 Atherosclerotic heart disease of native coronary artery without angina pectoris: Secondary | ICD-10-CM | POA: Diagnosis not present

## 2022-12-05 DIAGNOSIS — I10 Essential (primary) hypertension: Secondary | ICD-10-CM | POA: Diagnosis not present

## 2022-12-05 DIAGNOSIS — F1010 Alcohol abuse, uncomplicated: Secondary | ICD-10-CM | POA: Diagnosis not present

## 2022-12-05 DIAGNOSIS — J9601 Acute respiratory failure with hypoxia: Secondary | ICD-10-CM | POA: Diagnosis not present

## 2022-12-05 DIAGNOSIS — G629 Polyneuropathy, unspecified: Secondary | ICD-10-CM | POA: Diagnosis not present

## 2022-12-05 DIAGNOSIS — F411 Generalized anxiety disorder: Secondary | ICD-10-CM | POA: Diagnosis not present

## 2022-12-06 ENCOUNTER — Ambulatory Visit: Payer: Medicare Other | Admitting: Cardiology

## 2022-12-06 DIAGNOSIS — I252 Old myocardial infarction: Secondary | ICD-10-CM | POA: Diagnosis not present

## 2022-12-06 DIAGNOSIS — F1010 Alcohol abuse, uncomplicated: Secondary | ICD-10-CM | POA: Diagnosis not present

## 2022-12-06 DIAGNOSIS — E7800 Pure hypercholesterolemia, unspecified: Secondary | ICD-10-CM | POA: Diagnosis not present

## 2022-12-06 DIAGNOSIS — I2693 Single subsegmental pulmonary embolism without acute cor pulmonale: Secondary | ICD-10-CM | POA: Diagnosis not present

## 2022-12-06 DIAGNOSIS — J84112 Idiopathic pulmonary fibrosis: Secondary | ICD-10-CM | POA: Diagnosis not present

## 2022-12-06 DIAGNOSIS — F411 Generalized anxiety disorder: Secondary | ICD-10-CM | POA: Diagnosis not present

## 2022-12-06 DIAGNOSIS — J690 Pneumonitis due to inhalation of food and vomit: Secondary | ICD-10-CM | POA: Diagnosis not present

## 2022-12-06 DIAGNOSIS — G4733 Obstructive sleep apnea (adult) (pediatric): Secondary | ICD-10-CM | POA: Diagnosis not present

## 2022-12-06 DIAGNOSIS — I4892 Unspecified atrial flutter: Secondary | ICD-10-CM | POA: Diagnosis not present

## 2022-12-06 DIAGNOSIS — J9601 Acute respiratory failure with hypoxia: Secondary | ICD-10-CM | POA: Diagnosis not present

## 2022-12-06 DIAGNOSIS — I330 Acute and subacute infective endocarditis: Secondary | ICD-10-CM | POA: Diagnosis not present

## 2022-12-06 DIAGNOSIS — J432 Centrilobular emphysema: Secondary | ICD-10-CM | POA: Diagnosis not present

## 2022-12-06 DIAGNOSIS — I452 Bifascicular block: Secondary | ICD-10-CM | POA: Diagnosis not present

## 2022-12-06 DIAGNOSIS — I10 Essential (primary) hypertension: Secondary | ICD-10-CM | POA: Diagnosis not present

## 2022-12-06 DIAGNOSIS — G629 Polyneuropathy, unspecified: Secondary | ICD-10-CM | POA: Diagnosis not present

## 2022-12-06 DIAGNOSIS — I2510 Atherosclerotic heart disease of native coronary artery without angina pectoris: Secondary | ICD-10-CM | POA: Diagnosis not present

## 2022-12-07 DIAGNOSIS — H35033 Hypertensive retinopathy, bilateral: Secondary | ICD-10-CM | POA: Diagnosis not present

## 2022-12-07 DIAGNOSIS — H31013 Macula scars of posterior pole (postinflammatory) (post-traumatic), bilateral: Secondary | ICD-10-CM | POA: Diagnosis not present

## 2022-12-07 DIAGNOSIS — H43821 Vitreomacular adhesion, right eye: Secondary | ICD-10-CM | POA: Diagnosis not present

## 2022-12-07 DIAGNOSIS — H43812 Vitreous degeneration, left eye: Secondary | ICD-10-CM | POA: Diagnosis not present

## 2022-12-08 DIAGNOSIS — A419 Sepsis, unspecified organism: Secondary | ICD-10-CM | POA: Diagnosis not present

## 2022-12-08 DIAGNOSIS — I10 Essential (primary) hypertension: Secondary | ICD-10-CM | POA: Diagnosis not present

## 2022-12-08 DIAGNOSIS — I2693 Single subsegmental pulmonary embolism without acute cor pulmonale: Secondary | ICD-10-CM | POA: Diagnosis not present

## 2022-12-08 DIAGNOSIS — Z86711 Personal history of pulmonary embolism: Secondary | ICD-10-CM | POA: Diagnosis not present

## 2022-12-08 NOTE — Progress Notes (Signed)
Sent message, via epic in basket, requesting orders in epic from surgeon.  

## 2022-12-10 DIAGNOSIS — Z86711 Personal history of pulmonary embolism: Secondary | ICD-10-CM | POA: Diagnosis not present

## 2022-12-10 DIAGNOSIS — I10 Essential (primary) hypertension: Secondary | ICD-10-CM | POA: Diagnosis not present

## 2022-12-10 DIAGNOSIS — A419 Sepsis, unspecified organism: Secondary | ICD-10-CM | POA: Diagnosis not present

## 2022-12-10 DIAGNOSIS — I2693 Single subsegmental pulmonary embolism without acute cor pulmonale: Secondary | ICD-10-CM | POA: Diagnosis not present

## 2022-12-11 DIAGNOSIS — J84112 Idiopathic pulmonary fibrosis: Secondary | ICD-10-CM | POA: Diagnosis not present

## 2022-12-11 DIAGNOSIS — F411 Generalized anxiety disorder: Secondary | ICD-10-CM | POA: Diagnosis not present

## 2022-12-11 DIAGNOSIS — G4733 Obstructive sleep apnea (adult) (pediatric): Secondary | ICD-10-CM | POA: Diagnosis not present

## 2022-12-11 DIAGNOSIS — J301 Allergic rhinitis due to pollen: Secondary | ICD-10-CM | POA: Diagnosis not present

## 2022-12-11 DIAGNOSIS — M1990 Unspecified osteoarthritis, unspecified site: Secondary | ICD-10-CM | POA: Diagnosis not present

## 2022-12-11 DIAGNOSIS — Z7901 Long term (current) use of anticoagulants: Secondary | ICD-10-CM | POA: Diagnosis not present

## 2022-12-11 DIAGNOSIS — J9601 Acute respiratory failure with hypoxia: Secondary | ICD-10-CM | POA: Diagnosis not present

## 2022-12-11 DIAGNOSIS — G4761 Periodic limb movement disorder: Secondary | ICD-10-CM | POA: Diagnosis not present

## 2022-12-11 DIAGNOSIS — Z9981 Dependence on supplemental oxygen: Secondary | ICD-10-CM | POA: Diagnosis not present

## 2022-12-11 DIAGNOSIS — I4892 Unspecified atrial flutter: Secondary | ICD-10-CM | POA: Diagnosis not present

## 2022-12-11 DIAGNOSIS — K219 Gastro-esophageal reflux disease without esophagitis: Secondary | ICD-10-CM | POA: Diagnosis not present

## 2022-12-11 DIAGNOSIS — I452 Bifascicular block: Secondary | ICD-10-CM | POA: Diagnosis not present

## 2022-12-11 DIAGNOSIS — E7800 Pure hypercholesterolemia, unspecified: Secondary | ICD-10-CM | POA: Diagnosis not present

## 2022-12-11 DIAGNOSIS — I252 Old myocardial infarction: Secondary | ICD-10-CM | POA: Diagnosis not present

## 2022-12-11 DIAGNOSIS — F1010 Alcohol abuse, uncomplicated: Secondary | ICD-10-CM | POA: Diagnosis not present

## 2022-12-11 DIAGNOSIS — F32A Depression, unspecified: Secondary | ICD-10-CM | POA: Diagnosis not present

## 2022-12-11 DIAGNOSIS — I2510 Atherosclerotic heart disease of native coronary artery without angina pectoris: Secondary | ICD-10-CM | POA: Diagnosis not present

## 2022-12-11 DIAGNOSIS — J690 Pneumonitis due to inhalation of food and vomit: Secondary | ICD-10-CM | POA: Diagnosis not present

## 2022-12-11 DIAGNOSIS — J432 Centrilobular emphysema: Secondary | ICD-10-CM | POA: Diagnosis not present

## 2022-12-11 DIAGNOSIS — I330 Acute and subacute infective endocarditis: Secondary | ICD-10-CM | POA: Diagnosis not present

## 2022-12-11 DIAGNOSIS — I10 Essential (primary) hypertension: Secondary | ICD-10-CM | POA: Diagnosis not present

## 2022-12-11 DIAGNOSIS — G629 Polyneuropathy, unspecified: Secondary | ICD-10-CM | POA: Diagnosis not present

## 2022-12-11 DIAGNOSIS — M5030 Other cervical disc degeneration, unspecified cervical region: Secondary | ICD-10-CM | POA: Diagnosis not present

## 2022-12-11 DIAGNOSIS — N400 Enlarged prostate without lower urinary tract symptoms: Secondary | ICD-10-CM | POA: Diagnosis not present

## 2022-12-11 DIAGNOSIS — I2693 Single subsegmental pulmonary embolism without acute cor pulmonale: Secondary | ICD-10-CM | POA: Diagnosis not present

## 2022-12-13 ENCOUNTER — Ambulatory Visit: Payer: Self-pay | Admitting: General Surgery

## 2022-12-13 DIAGNOSIS — J84112 Idiopathic pulmonary fibrosis: Secondary | ICD-10-CM | POA: Diagnosis not present

## 2022-12-13 DIAGNOSIS — I4892 Unspecified atrial flutter: Secondary | ICD-10-CM | POA: Diagnosis not present

## 2022-12-13 DIAGNOSIS — I2693 Single subsegmental pulmonary embolism without acute cor pulmonale: Secondary | ICD-10-CM | POA: Diagnosis not present

## 2022-12-13 DIAGNOSIS — G4733 Obstructive sleep apnea (adult) (pediatric): Secondary | ICD-10-CM | POA: Diagnosis not present

## 2022-12-13 DIAGNOSIS — I10 Essential (primary) hypertension: Secondary | ICD-10-CM | POA: Diagnosis not present

## 2022-12-13 DIAGNOSIS — G629 Polyneuropathy, unspecified: Secondary | ICD-10-CM | POA: Diagnosis not present

## 2022-12-13 DIAGNOSIS — F1010 Alcohol abuse, uncomplicated: Secondary | ICD-10-CM | POA: Diagnosis not present

## 2022-12-13 DIAGNOSIS — J690 Pneumonitis due to inhalation of food and vomit: Secondary | ICD-10-CM | POA: Diagnosis not present

## 2022-12-13 DIAGNOSIS — I252 Old myocardial infarction: Secondary | ICD-10-CM | POA: Diagnosis not present

## 2022-12-13 DIAGNOSIS — I452 Bifascicular block: Secondary | ICD-10-CM | POA: Diagnosis not present

## 2022-12-13 DIAGNOSIS — F411 Generalized anxiety disorder: Secondary | ICD-10-CM | POA: Diagnosis not present

## 2022-12-13 DIAGNOSIS — I330 Acute and subacute infective endocarditis: Secondary | ICD-10-CM | POA: Diagnosis not present

## 2022-12-13 DIAGNOSIS — J9601 Acute respiratory failure with hypoxia: Secondary | ICD-10-CM | POA: Diagnosis not present

## 2022-12-13 DIAGNOSIS — J432 Centrilobular emphysema: Secondary | ICD-10-CM | POA: Diagnosis not present

## 2022-12-13 DIAGNOSIS — E7800 Pure hypercholesterolemia, unspecified: Secondary | ICD-10-CM | POA: Diagnosis not present

## 2022-12-13 DIAGNOSIS — I2510 Atherosclerotic heart disease of native coronary artery without angina pectoris: Secondary | ICD-10-CM | POA: Diagnosis not present

## 2022-12-13 NOTE — Progress Notes (Signed)
Second request for pre op orders in [facility]: Spoke with Abigail Butts.

## 2022-12-14 DIAGNOSIS — I10 Essential (primary) hypertension: Secondary | ICD-10-CM | POA: Diagnosis not present

## 2022-12-14 DIAGNOSIS — J9601 Acute respiratory failure with hypoxia: Secondary | ICD-10-CM | POA: Diagnosis not present

## 2022-12-14 DIAGNOSIS — I4892 Unspecified atrial flutter: Secondary | ICD-10-CM | POA: Diagnosis not present

## 2022-12-14 DIAGNOSIS — I2693 Single subsegmental pulmonary embolism without acute cor pulmonale: Secondary | ICD-10-CM | POA: Diagnosis not present

## 2022-12-14 DIAGNOSIS — I452 Bifascicular block: Secondary | ICD-10-CM | POA: Diagnosis not present

## 2022-12-14 DIAGNOSIS — I330 Acute and subacute infective endocarditis: Secondary | ICD-10-CM | POA: Diagnosis not present

## 2022-12-14 DIAGNOSIS — J432 Centrilobular emphysema: Secondary | ICD-10-CM | POA: Diagnosis not present

## 2022-12-14 DIAGNOSIS — F411 Generalized anxiety disorder: Secondary | ICD-10-CM | POA: Diagnosis not present

## 2022-12-14 DIAGNOSIS — G629 Polyneuropathy, unspecified: Secondary | ICD-10-CM | POA: Diagnosis not present

## 2022-12-14 DIAGNOSIS — F1010 Alcohol abuse, uncomplicated: Secondary | ICD-10-CM | POA: Diagnosis not present

## 2022-12-14 DIAGNOSIS — I2510 Atherosclerotic heart disease of native coronary artery without angina pectoris: Secondary | ICD-10-CM | POA: Diagnosis not present

## 2022-12-14 DIAGNOSIS — I252 Old myocardial infarction: Secondary | ICD-10-CM | POA: Diagnosis not present

## 2022-12-14 DIAGNOSIS — E7800 Pure hypercholesterolemia, unspecified: Secondary | ICD-10-CM | POA: Diagnosis not present

## 2022-12-14 DIAGNOSIS — J690 Pneumonitis due to inhalation of food and vomit: Secondary | ICD-10-CM | POA: Diagnosis not present

## 2022-12-14 DIAGNOSIS — G4733 Obstructive sleep apnea (adult) (pediatric): Secondary | ICD-10-CM | POA: Diagnosis not present

## 2022-12-14 DIAGNOSIS — J84112 Idiopathic pulmonary fibrosis: Secondary | ICD-10-CM | POA: Diagnosis not present

## 2022-12-14 NOTE — Patient Instructions (Signed)
DUE TO COVID-19 ONLY TWO VISITORS  (aged 72 and older)  ARE ALLOWED TO COME WITH YOU AND STAY IN THE WAITING ROOM ONLY DURING PRE OP AND PROCEDURE.   **NO VISITORS ARE ALLOWED IN THE SHORT STAY AREA OR RECOVERY ROOM!!**  IF YOU WILL BE ADMITTED INTO THE HOSPITAL YOU ARE ALLOWED ONLY FOUR SUPPORT PEOPLE DURING VISITATION HOURS ONLY (7 AM -8PM)   The support person(s) must pass our screening, gel in and out, and wear a mask at all times, including in the patient's room. Patients must also wear a mask when staff or their support person are in the room. Visitors GUEST BADGE MUST BE WORN VISIBLY  One adult visitor may remain with you overnight and MUST be in the room by 8 P.M.     Your procedure is scheduled on: 12/21/22   Report to [facility] Main Entrance    Report to admitting at : 5:30 AM   Call this number if you have problems the morning of surgery [phone]   Do not eat food or drink: After Midnight.  FOLLOW ANY ADDITIONAL PRE OP INSTRUCTIONS YOU RECEIVED FROM YOUR SURGEON'S OFFICE!!!   Oral Hygiene is also important to reduce your risk of infection.                                    Remember - BRUSH YOUR TEETH THE MORNING OF SURGERY WITH YOUR REGULAR TOOTHPASTE  DENTURES WILL BE REMOVED PRIOR TO SURGERY PLEASE DO NOT APPLY "Poly grip" OR ADHESIVES!!!   Do NOT smoke after Midnight   Take these medicines the morning of surgery with A SIP OF WATER:ranolaxine(ranexa),buspirone,pregabaline,bupropion,pirfenidone,pantoprazole.Tylenol and lorazepam as needed.   Bring CPAP mask and tubing day of surgery.                              You may not have any metal on your body including hair pins, jewelry, and body piercing             Do not wear lotions, powders, perfumes/cologne, or deodorant              Men may shave face and neck.   Do not bring valuables to the hospital. [facility] IS NOT             RESPONSIBLE   FOR VALUABLES.   Contacts, glasses, or bridgework  may not be worn into surgery.   Bring small overnight bag day of surgery.   DO NOT BRING YOUR HOME MEDICATIONS TO THE HOSPITAL. PHARMACY WILL DISPENSE MEDICATIONS LISTED ON YOUR MEDICATION LIST TO YOU DURING YOUR ADMISSION IN THE HOSPITAL!    Patients discharged on the day of surgery will not be allowed to drive home.  Someone NEEDS to stay with you for the first 24 hours after anesthesia.   Special Instructions: Bring a copy of your healthcare power of attorney and living will documents         the day of surgery if you haven't scanned them before.              Please read over the following fact sheets you were given: IF YOU HAVE QUESTIONS ABOUT YOUR PRE-OP INSTRUCTIONS PLEASE CALL [phone]    [facility] - Preparing for Surgery Before surgery, you can play an important role.  Because skin is not sterile, your skin  needs to be as free of germs as possible.  You can reduce the number of germs on your skin by washing with CHG (chlorahexidine gluconate) soap before surgery.  CHG is an antiseptic cleaner which kills germs and bonds with the skin to continue killing germs even after washing. Please DO NOT use if you have an allergy to CHG or antibacterial soaps.  If your skin becomes reddened/irritated stop using the CHG and inform your nurse when you arrive at Short Stay. Do not shave (including legs and underarms) for at least 48 hours prior to the first CHG shower.  You may shave your face/neck. Please follow these instructions carefully:  1.  Shower with CHG Soap the night before surgery and the  morning of Surgery.  2.  If you choose to wash your hair, wash your hair first as usual with your  normal  shampoo.  3.  After you shampoo, rinse your hair and body thoroughly to remove the  shampoo.                           4.  Use CHG as you would any other liquid soap.  You can apply chg directly  to the skin and wash                       Gently with a scrungie or clean washcloth.  5.  Apply  the CHG Soap to your body ONLY FROM THE NECK DOWN.   Do not use on face/ open                           Wound or open sores. Avoid contact with eyes, ears mouth and genitals (private parts).                       Wash face,  Genitals (private parts) with your normal soap.             6.  Wash thoroughly, paying special attention to the area where your surgery  will be performed.  7.  Thoroughly rinse your body with warm water from the neck down.  8.  DO NOT shower/wash with your normal soap after using and rinsing off  the CHG Soap.                9.  Pat yourself dry with a clean towel.            10.  Wear clean pajamas.            11.  Place clean sheets on your bed the night of your first shower and do not  sleep with pets. Day of Surgery : Do not apply any lotions/deodorants the morning of surgery.  Please wear clean clothes to the hospital/surgery center.  FAILURE TO FOLLOW THESE INSTRUCTIONS MAY RESULT IN THE CANCELLATION OF YOUR SURGERY PATIENT SIGNATURE_________________________________  NURSE SIGNATURE__________________________________  ________________________________________________________________________

## 2022-12-15 ENCOUNTER — Other Ambulatory Visit: Payer: Self-pay

## 2022-12-15 ENCOUNTER — Encounter (HOSPITAL_COMMUNITY): Payer: Self-pay

## 2022-12-15 ENCOUNTER — Encounter (HOSPITAL_COMMUNITY)
Admission: RE | Admit: 2022-12-15 | Discharge: 2022-12-15 | Disposition: A | Discharge status: Home / Self Care | Payer: Medicare Other | Source: Ambulatory Visit | Attending: General Surgery | Admitting: General Surgery

## 2022-12-15 DIAGNOSIS — G629 Polyneuropathy, unspecified: Secondary | ICD-10-CM | POA: Insufficient documentation

## 2022-12-15 DIAGNOSIS — I252 Old myocardial infarction: Secondary | ICD-10-CM | POA: Insufficient documentation

## 2022-12-15 DIAGNOSIS — J9610 Chronic respiratory failure, unspecified whether with hypoxia or hypercapnia: Secondary | ICD-10-CM | POA: Insufficient documentation

## 2022-12-15 DIAGNOSIS — Z86711 Personal history of pulmonary embolism: Secondary | ICD-10-CM | POA: Insufficient documentation

## 2022-12-15 DIAGNOSIS — F419 Anxiety disorder, unspecified: Secondary | ICD-10-CM | POA: Diagnosis not present

## 2022-12-15 DIAGNOSIS — K819 Cholecystitis, unspecified: Secondary | ICD-10-CM | POA: Diagnosis not present

## 2022-12-15 DIAGNOSIS — J189 Pneumonia, unspecified organism: Secondary | ICD-10-CM | POA: Diagnosis not present

## 2022-12-15 DIAGNOSIS — I4891 Unspecified atrial fibrillation: Secondary | ICD-10-CM | POA: Diagnosis not present

## 2022-12-15 DIAGNOSIS — Z9889 Other specified postprocedural states: Secondary | ICD-10-CM | POA: Diagnosis not present

## 2022-12-15 DIAGNOSIS — F32A Depression, unspecified: Secondary | ICD-10-CM | POA: Insufficient documentation

## 2022-12-15 DIAGNOSIS — Z01818 Encounter for other preprocedural examination: Secondary | ICD-10-CM | POA: Insufficient documentation

## 2022-12-15 DIAGNOSIS — Z7901 Long term (current) use of anticoagulants: Secondary | ICD-10-CM | POA: Insufficient documentation

## 2022-12-15 DIAGNOSIS — I4892 Unspecified atrial flutter: Secondary | ICD-10-CM | POA: Insufficient documentation

## 2022-12-15 DIAGNOSIS — G4733 Obstructive sleep apnea (adult) (pediatric): Secondary | ICD-10-CM | POA: Insufficient documentation

## 2022-12-15 DIAGNOSIS — I10 Essential (primary) hypertension: Secondary | ICD-10-CM | POA: Diagnosis not present

## 2022-12-15 DIAGNOSIS — I2510 Atherosclerotic heart disease of native coronary artery without angina pectoris: Secondary | ICD-10-CM | POA: Diagnosis not present

## 2022-12-15 DIAGNOSIS — J440 Chronic obstructive pulmonary disease with acute lower respiratory infection: Secondary | ICD-10-CM | POA: Diagnosis not present

## 2022-12-15 DIAGNOSIS — Z9861 Coronary angioplasty status: Secondary | ICD-10-CM | POA: Insufficient documentation

## 2022-12-15 DIAGNOSIS — K219 Gastro-esophageal reflux disease without esophagitis: Secondary | ICD-10-CM | POA: Insufficient documentation

## 2022-12-15 DIAGNOSIS — Z87891 Personal history of nicotine dependence: Secondary | ICD-10-CM | POA: Diagnosis not present

## 2022-12-15 HISTORY — DX: Dyspnea, unspecified: R06.00

## 2022-12-15 HISTORY — DX: Cardiac arrhythmia, unspecified: I49.9

## 2022-12-15 HISTORY — DX: Personal history of urinary calculi: Z87.442

## 2022-12-15 NOTE — Progress Notes (Signed)
For Short Stay: COVID SWAB appointment date:  Bowel Prep reminder:   For Anesthesia: PCP - [name], [name]  Cardiologist - Georgeanna Lea, MD . LOV: 12/01/22: Clearance  Chest x-ray - 10/15/22 EKG - 10/12/22 Stress Test -  ECHO - 11/17/22 Cardiac Cath - 01/10/19 PFT: 11/25/22 Pacemaker/ICD device last checked: Pacemaker orders received: Device Rep notified:  Spinal Cord Stimulator: N/A  Sleep Study - Yes CPAP - Yes  Fasting Blood Sugar - N/A Checks Blood Sugar _____ times a day Date and result of last Hgb A1c-  Last dose of GLP1 agonist- N/A GLP1 instructions:   Last dose of SGLT-2 inhibitors- N/A SGLT-2 instructions:   Blood Thinner Instructions: Eliquis: last dose will be: 12/18/22 Aspirin Instructions: Last Dose:  Activity level: Can go up a flight of stairs and activities of daily living without stopping and without chest pain and/or shortness of breath     Unable to go up a flight of stairs without shortness of breath    Anesthesia review: Hx: MI,HTN,CAD,Aflutter,OSA(CPAP),COPD (2L O2 continuous),IPF,PE,PAF.  Patient denies shortness of breath, fever, cough and chest pain at PAT appointment   Patient verbalized understanding of instructions that were given to them at the PAT appointment. Patient was also instructed that they will need to review over the PAT instructions again at home before surgery.

## 2022-12-16 ENCOUNTER — Other Ambulatory Visit: Payer: Self-pay | Admitting: Neurology

## 2022-12-16 NOTE — Telephone Encounter (Signed)
Requested Prescriptions   Pending Prescriptions Disp Refills   zolpidem (AMBIEN) 5 MG tablet [Pharmacy Med Name: ZOLPIDEM 5MG  TABLETS] 30 tablet     Sig: TAKE 1 TABLET(5 MG) BY MOUTH AT BEDTIME AS NEEDED FOR SLEEP   Last seen 10/27/22, next appt 05/11/23 Dispenses   Dispensed Days Supply Quantity Provider Pharmacy  ZOLPIDEM 5MG  TABLETS 11/08/2022 30 30 each Windell Norfolk, MD [facility] DRUG STORE #...  ZOLPIDEM 5MG  TABLETS 08/28/2022 30 30 each Windell Norfolk, MD [facility] DRUG STORE #...  ZOLPIDEM 5MG  TABLETS 07/09/2022 30 60 each Waymon Budge, MD [facility] DRUG STORE #...  ZOLPIDEM 5MG  TABLETS 06/11/2022 30 30 each Waymon Budge, MD [facility] DRUG STORE #...  ZOLPIDEM 5MG  TABLETS 05/04/2022 30 60 each Waymon Budge, MD [facility] DRUG STORE #...  ZOLPIDEM 5MG  TABLETS 04/12/2022 30 30 each Windell Norfolk, MD [facility] DRUG STORE #...  ZOLPIDEM 5MG  TABLETS 03/08/2022 30 30 each Windell Norfolk, MD [facility] DRUG STORE #.Marland KitchenMarland Kitchen

## 2022-12-17 ENCOUNTER — Encounter (HOSPITAL_COMMUNITY): Payer: Self-pay

## 2022-12-17 NOTE — Anesthesia Preprocedure Evaluation (Signed)
Anesthesia Evaluation    Airway        Dental   Pulmonary former smoker          Cardiovascular hypertension,      Neuro/Psych    GI/Hepatic   Endo/Other    Renal/GU      Musculoskeletal   Abdominal   Peds  Hematology   Anesthesia Other Findings   Reproductive/Obstetrics                              Anesthesia Physical Anesthesia Plan  ASA:   Anesthesia Plan:    Post-op Pain Management:    Induction:   PONV Risk Score and Plan:   Airway Management Planned:   Additional Equipment:   Intra-op Plan:   Post-operative Plan:   Informed Consent:   Plan Discussed with:   Anesthesia Plan Comments: (See PAT note from 9/25 by Sherlie Ban [name] )         Anesthesia Quick Evaluation

## 2022-12-17 NOTE — Progress Notes (Signed)
Case: [id number] Date/Time: 12/21/22 0714   Procedure: LAPAROSCOPIC CHOLECYSTECTOMY   Anesthesia type: General   Pre-op diagnosis: ACUTE CHOLECYSTITIS   Location: [facility] ROOM 04 / [facility] OR[facility]   Surgeons: Kinsinger, De Blanch, MD       DISCUSSION: Robert Lang is a 72 yo male who presents to PAT prior to surgery above. PMH of former smoking, HTN, CAD s/p PCI to RCA and LAD, hx of STEMI (12/2018), A.fib/flutter s/p ablation in 2017, COPD, IPF, chronic respiratory failure on 2L O2, hx of PE, OSA, GERD, neuropathy, depression, anxiety, and cholecystitis.  Patient was admitted on 08/28/22 for SOB and found to have an acute PE despite Xarelto use for A.fib. Also found to have cholecystitis. Underwent perc chole drain with IR due to needing anticoagulation for PE and respiratory issues. Anticoagulation was switched to Eliquis at discharge on 09/01/22.   Patient was readmitted on 6/27 for sepsis due to pneumonia. Completed antibiotics. Was discharged on home O2 on 6/29.  Admitted again on 7/16 due to recurrent cholecystitis due to perc chole tube being malpositioned. IR exchanged the drain and patient felt better. Has finished another round of antibiotics. Discharged on 7/20  Since then he has followed up with his Cardiologist and Pulmonologist. He saw Cardiology in clinic on 12/01/22. Reported fatigue but no chest pain. Has chronic SOB which is at baseline. Has poor functional status. Per Dr. Bing Matter:  "Cardiovascular preop evaluation for potential cholecystectomy. He got drain in his gallbladder and he hide that he wants to have surgery done today. Echocardiogram reviewed looks fine. No recent evaluation for his heart therefore I will schedule him to have Lexiscan if Lexiscan is negative then we will be able to proceed. He also got recent history of PE therefore his anticoagulation need to be withdrawn for 24 to 48 hours before surgery and resume as quickly as feasible from surgical point of view. He does  understand there are some high risk of quickly proceeding to this surgery that being already 3 months after his PE. But he is determined that he would like to have it out if not he said he will take it out by himself "'  Stress test was done on 12/02/22 and was normal.  Patient saw Pulmonology on 11/25/22. He has had "Mild exercise hypoxemia 11/25/2022 reflect post hospitalization versus mild gradual chronic decline in IPF" . He is now on O2 24/7. Per Dr. Marchelle Gearing: "Plan:  - ok to have surgery but hold eliquis 2d before surgery and start within 2d after surgery if that works for surgeon"  VS: BP 116/73   Pulse 84   Temp 36.6 C (Oral)   Ht 5\' 11"  (1.803 m)   Wt 68 kg   SpO2 98% Comment: 2 L O2  BMI 20.92 kg/m   PROVIDERS: [name], [name] Cardiology: Gypsy Balsam, MD Pulmonology: Kalman Shan, MD  LABS: Labs reviewed: Acceptable for surgery. (all labs ordered are listed, but only abnormal results are displayed)  Labs Reviewed - No data to display   IMAGES:  CXR 10/15/2022:  FINDINGS: Heart is enlarged. Lungs are clear. No edema or effusions are present. No focal airspace disease is present. Cholecystostomy tube remains in place. The visualized soft tissues and bony thorax are unremarkable.   IMPRESSION: 1. Cardiomegaly without failure. 2. No acute cardiopulmonary disease.  CTA Chest 09/17/22:  IMPRESSION: 1. Positive for a single small-volume subsegmental embolus in the lingula. No other emboli are seen. 2. Subsegmental emboli in the left lower lobe noted  previously have cleared in the interval. 3. Cardiomegaly without evidence of acute right heart strain or venous dilatation. Chronic slight right chamber predominance. 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 4. Aortic and coronary artery atherosclerosis and lipomatous hypertrophy of the inter-atrial septum. 5. Trace pleural effusions with increased opacities in the posterior basal lower lobes which could  be atelectasis or developing pneumonia. 6. Chronic lower lobe bronchial thickening. 7. Percutaneous cholecystostomy drain. 8. Hepatic steatosis and mild hepatomegaly. 9. Osteopenia and degenerative change.   Aortic Atherosclerosis (ICD10-I70.0).   EKG:   CV:  Stress test 12/02/22:    The study is normal. The study is low risk.   No ST deviation was noted.   Left ventricular function is normal. Nuclear stress EF: 68%. The left ventricular ejection fraction is hyperdynamic (>65%). End diastolic cavity size is normal. End systolic cavity size is normal.   Prior study available for comparison from 10/24/2019.  Echo 11/17/22:  IMPRESSIONS     1. Left ventricular ejection fraction, by estimation, is 60 to 65%. The  left ventricle has normal function. The left ventricle has no regional  wall motion abnormalities. There is mild concentric left ventricular  hypertrophy. Left ventricular diastolic  parameters are indeterminate. The average left ventricular global  longitudinal strain is -11.0 %. The global longitudinal strain is  abnormal.   2. Right ventricular systolic function is normal. The right ventricular  size is mildly enlarged. There is normal pulmonary artery systolic  pressure.   3. The mitral valve is grossly normal. Mild mitral valve regurgitation.  No evidence of mitral stenosis.   4. The aortic valve is tricuspid. Aortic valve regurgitation is not  visualized. Aortic valve sclerosis/calcification is present, without any  evidence of aortic stenosis.   5. There is borderline dilatation of the ascending aorta, measuring 36  mm.   6. The inferior vena cava is normal in size with greater than 50%  respiratory variability, suggesting right atrial pressure of 3 mmHg.   7. Rhythm strip during this exam demonstrates possibly atrial  fibrillation.   Comparison(s): A prior study was performed on 05/19/2022. No significant  change from prior study.   Cath  01/10/2019:  CULPRIT lesion prox RCA lesion is 100% stenosed. A drug-eluting stent (stent 2) was successfully placed to overlap stent 1 and cover the culprit lesion, using a STENT RESOLUTE ONYX [id number]. Postdilated 2.9 mm Post intervention, there is a 0% residual stenosis. Just distal to culprit lesion: Prox RCA to Mid RCA lesion is 70% stenosed. (Noted after initial angioplasty of the 100% stenosis)) Post intervention, there is a 0% residual stenosis. A drug-eluting stent (stent 1) was successfully placed covering the distal portion of the lesion segment and landing just distal to the culprit lesion, using a STENT RESOLUTE ONYX [id number]. Postdilated to 2.6 mm distally and 2.9 mm proximally at the overlap -------------------------------- Prox LAD to Mid LAD lesion is 50% stenosed -just prior to beginning of Previously placed Mid LAD to Dist LAD stent (unknown type) is widely patent. Small caliber, jailed 2nd Diag vessel has ostial 90% stenosis as previously described. There is no aortic valve stenosis.   SUMMARY Significant two-vessel disease with culprit lesion being 100% occluded proximal-mid RCA with widely patent mid LAD stent preceded by a long segment of 40 to 50% stenosis. Codominant system Normal LVEDP--LV gram not done because of radial artery spasm     RECOMMENDATIONS Admit for monitoring in the CVICU tonight anticipate transfer to the telemetry floor tomorrow with potential fast-track discharge  by Friday 10/23 I have increased his atorvastatin to 80 mg daily He is on diltiazem for rate control, if EF is still preserved, would be okay to continue with diltiazem otherwise would switch to beta-blocker.  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 M75.00)   Allergic rhinitis due to  pollen 09/03/2013   J30.1)  Formatting of this note might be different from the original. J30.1)   Allergy    Anticoagulated 07/01/2016   Anxiety    Arthritis    Atrial fibrillation (HCC)    Atrial flutter (HCC) 06/26/2015   CAD (coronary artery disease)    Chest pain 06/26/2015   COPD (chronic obstructive pulmonary disease) (HCC)    Coronary artery disease 07/06/2018   Cardiac catheterization 2017 showing 90% small diagonal branch disease   DDD (degenerative disc disease), cervical 09/03/2013   M50.90)  Formatting of this note might be different from the original. M50.90)   Depression    Dizziness 10/28/2017   Dyslipidemia, goal LDL below 70 07/06/2018   Dyspnea on exertion 10/20/2018   Dysrhythmia    Essential hypertension 07/06/2018   ETOH abuse 01/11/2019   6 pack of beer per day   Falls 10/28/2017   GERD (gastroesophageal reflux disease)    H/O amiodarone therapy 07/01/2016   Heart palpitations 01/27/2017   History of colon polyps    History of kidney stones    Hyperlipidemia    Hypertension    IPF (idiopathic pulmonary fibrosis) (HCC)    Neck pain 09/03/2013   Neuropathy 07/06/2018   Obstructive sleep apnea 08/05/2015   PLMD (periodic limb movement disorder) 11/04/2015   Polycythemia, 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 STORY: Due to alcohol/ tobacco   Pure hypercholesterolemia 09/03/2013   E78.0)  Formatting of this note might be different from the original. 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/06/2018   2017    Past Surgical History:  Procedure Laterality Date   ATRIAL FIBRILLATION ABLATION  10/2015   BACK SURGERY     CARDIOVERSION  2017   CATARACT EXTRACTION Bilateral 2017   March and April 2017   COLONOSCOPY  2018   CORONARY STENT PLACEMENT  2012   CORONARY/GRAFT ACUTE MI REVASCULARIZATION N/A 01/10/2019   Procedure:  CORONARY/GRAFT ACUTE MI REVASCULARIZATION;  Surgeon: Marykay Lex, MD;  Location: [facility] INVASIVE CV LAB;  Service: Cardiovascular;  Laterality: N/A;   INGUINAL HERNIA REPAIR     over 20 years ago   IR CHOLANGIOGRAM EXISTING TUBE  09/16/2022   IR EXCHANGE BILIARY DRAIN  10/06/2022   IR PERC CHOLECYSTOSTOMY  08/30/2022   LAPAROSCOPIC APPENDECTOMY N/A 01/17/2021   Procedure: APPENDECTOMY LAPAROSCOPIC;  Surgeon: Quentin Ore, MD;  Location: [facility] OR;  Service: General;  Laterality: N/A;   LEFT HEART CATH AND CORONARY ANGIOGRAPHY N/A 01/10/2019   Procedure: LEFT HEART CATH AND CORONARY ANGIOGRAPHY;  Surgeon: Marykay Lex, MD;  Location: [facility] INVASIVE CV LAB;  Service: Cardiovascular;  Laterality: N/A;   LUMBAR LAMINECTOMY Bilateral 04/05/2016   L2-L5    VENTRAL HERNIA REPAIR  2018    MEDICATIONS:  acetaminophen (TYLENOL) 500 MG tablet   apixaban (ELIQUIS) 5 MG TABS tablet  atorvastatin (LIPITOR) 80 MG tablet   buPROPion (WELLBUTRIN XL) 300 MG 24 hr tablet   busPIRone (BUSPAR) 15 MG tablet   Cholecalciferol (VITAMIN D3) 50 MCG (2000 UT) TABS   docusate sodium (COLACE) 100 MG capsule   famotidine (PEPCID) 40 MG tablet   fexofenadine (ALLEGRA) 180 MG tablet   finasteride (PROSCAR) 5 MG tablet   lamoTRIgine (LAMICTAL) 100 MG tablet   levalbuterol (XOPENEX) 1.25 MG/0.5ML nebulizer solution   LORazepam (ATIVAN) 0.5 MG tablet   Melatonin 10 MG TABS   MUCINEX 600 MG 12 hr tablet   Multiple Vitamins-Minerals (PRESERVISION AREDS PO)   nitroGLYCERIN (NITROSTAT) 0.4 MG SL tablet   ondansetron (ZOFRAN) 4 MG tablet   pantoprazole (PROTONIX) 40 MG tablet   Pirfenidone (ESBRIET) 801 MG TABS   polyethylene glycol powder (GLYCOLAX/MIRALAX) 17 GM/SCOOP powder   pregabalin (LYRICA) 150 MG capsule   ranolazine (RANEXA) 1000 MG SR tablet   sertraline (ZOLOFT) 100 MG tablet   Sodium Chloride Flush (NORMAL SALINE FLUSH) 0.9 % SOLN   traZODone (DESYREL) 50 MG tablet   vitamin B-12 (CYANOCOBALAMIN)  1000 MCG tablet   zolpidem (AMBIEN) 5 MG tablet   No current facility-administered medications for this encounter.    regadenoson (LEXISCAN) injection SOLN 0.4 mg   technetium tetrofosmin (TC-MYOVIEW) injection 31.7 millicurie   Ubaldo Glassing, [name] [facility] Surgical Short Stay/Anesthesiology [facility] Phone [phone] 12/17/2022 11:05 AM

## 2022-12-20 ENCOUNTER — Telehealth: Payer: Self-pay | Admitting: Student

## 2022-12-20 ENCOUNTER — Telehealth: Payer: Self-pay

## 2022-12-20 ENCOUNTER — Ambulatory Visit: Payer: Self-pay

## 2022-12-20 NOTE — Patient Outreach (Signed)
Care Coordination   Follow Up Visit Note   12/20/2022 Name: Khaliq Burggraf MRN: [id number] DOB: September 07, 1950  Terique Skene is a 72 y.o. year old male who sees [name], [name], [place] for primary care. I spoke with  [name] by phone today.  What matters to the patients health and wellness today?  Mr. Khanna reports he is doing well. He is excited to have upcoming cholecystectomy scheduled for tomorrow. He report shas had drain for 3 months and is ready for it to come out. He is without any questions or concerns at this time.   Goals Addressed             This Visit's Progress    Continue to improve post hospitalization       Interventions Today    Flowsheet Row Most Recent Value  Chronic Disease   Chronic disease during today's visit Other, Chronic Obstructive Pulmonary Disease (COPD), Hypertension (HTN)  [cholecystitis, idiopathic pulmonary fibrosis]  General Interventions   General Interventions Discussed/Reviewed General Interventions Reviewed  Education Interventions   Education Provided Provided Education  Provided Verbal Education On Other  [instructed on importance of reviewing discharge instructions for understanding instrctions as discharge. confirmed patient has RNCM contact number regarding care management as needed.]            SDOH assessments and interventions completed:  No  Care Coordination Interventions:  Yes, provided   Follow up plan: Follow up call scheduled for 12/28/22    Encounter Outcome:  Patient Visit Completed   Kathyrn Sheriff, RN, MSN, BSN, CCM Care Management Coordinator [phone]

## 2022-12-20 NOTE — Telephone Encounter (Signed)
Received fax from [place] stating that pt is eligible to continue receiving PAP for [facility] through 2025. Approval letter sent to scan center for retention.

## 2022-12-20 NOTE — Patient Instructions (Signed)
Visit Information  Thank you for taking time to visit with me today. Please don't hesitate to contact me if I can be of assistance to you.   Following are the goals we discussed today:  Continue to take medications as prescribed. Continue to attend provider visits as scheduled Continue to eat healthy, lean meats, vegetables, fruits, avoid saturated and transfats Contact provider with health questions as indicated   Our next appointment is by telephone on 12/28/22 at 3:15 pm  Please call the care guide team at [phone] if you need to cancel or reschedule your appointment.   If you are experiencing a Mental Health or Behavioral Health Crisis or need someone to talk to, please call the Suicide and Crisis Lifeline: 988 call the [place] National Suicide Prevention Lifeline: 419-068-4969 or TTY: (325)438-7373 TTY 808-848-1097) to talk to a trained counselor call 1-800-273-TALK (toll free, 24 hour hotline)  Kathyrn Sheriff, RN, MSN, BSN, CCM Care Management Coordinator [phone]

## 2022-12-20 NOTE — Telephone Encounter (Signed)
Patient's wife called with concern for skin stitch coming loose around percutaneous cholecystostomy tube.  Patient without complaint.  No nausea, vomiting, fever, poor PO tolerance, or concern tube has dislodged completely.  He continues to have drainage into his gravity bag.   Encouraged wife to continue with current interventions and proceed to peri-op tomorrow morning as scheduled.   Loyce Dys, MS RD [name]

## 2022-12-21 ENCOUNTER — Other Ambulatory Visit: Payer: Self-pay

## 2022-12-21 ENCOUNTER — Ambulatory Visit (HOSPITAL_COMMUNITY): Payer: Medicare Other | Admitting: Medical

## 2022-12-21 ENCOUNTER — Encounter (HOSPITAL_COMMUNITY): Payer: Self-pay | Admitting: General Surgery

## 2022-12-21 ENCOUNTER — Encounter (HOSPITAL_COMMUNITY)
Admission: RE | Disposition: A | Discharge status: Home / Self Care | Payer: Self-pay | Source: Ambulatory Visit | Attending: General Surgery

## 2022-12-21 ENCOUNTER — Ambulatory Visit (HOSPITAL_BASED_OUTPATIENT_CLINIC_OR_DEPARTMENT_OTHER): Payer: Self-pay | Admitting: Anesthesiology

## 2022-12-21 ENCOUNTER — Ambulatory Visit (HOSPITAL_COMMUNITY)
Admission: RE | Admit: 2022-12-21 | Discharge: 2022-12-21 | Disposition: A | Discharge status: Home / Self Care | Payer: Medicare Other | Source: Ambulatory Visit | Attending: General Surgery | Admitting: General Surgery

## 2022-12-21 DIAGNOSIS — Z9981 Dependence on supplemental oxygen: Secondary | ICD-10-CM | POA: Insufficient documentation

## 2022-12-21 DIAGNOSIS — I10 Essential (primary) hypertension: Secondary | ICD-10-CM | POA: Insufficient documentation

## 2022-12-21 DIAGNOSIS — I4891 Unspecified atrial fibrillation: Secondary | ICD-10-CM | POA: Insufficient documentation

## 2022-12-21 DIAGNOSIS — J9610 Chronic respiratory failure, unspecified whether with hypoxia or hypercapnia: Secondary | ICD-10-CM | POA: Insufficient documentation

## 2022-12-21 DIAGNOSIS — I2510 Atherosclerotic heart disease of native coronary artery without angina pectoris: Secondary | ICD-10-CM | POA: Insufficient documentation

## 2022-12-21 DIAGNOSIS — J441 Chronic obstructive pulmonary disease with (acute) exacerbation: Secondary | ICD-10-CM | POA: Diagnosis not present

## 2022-12-21 DIAGNOSIS — I080 Rheumatic disorders of both mitral and aortic valves: Secondary | ICD-10-CM | POA: Insufficient documentation

## 2022-12-21 DIAGNOSIS — K8012 Calculus of gallbladder with acute and chronic cholecystitis without obstruction: Secondary | ICD-10-CM | POA: Insufficient documentation

## 2022-12-21 DIAGNOSIS — Z86711 Personal history of pulmonary embolism: Secondary | ICD-10-CM | POA: Diagnosis not present

## 2022-12-21 DIAGNOSIS — Z7901 Long term (current) use of anticoagulants: Secondary | ICD-10-CM | POA: Insufficient documentation

## 2022-12-21 DIAGNOSIS — K810 Acute cholecystitis: Secondary | ICD-10-CM | POA: Diagnosis not present

## 2022-12-21 DIAGNOSIS — I252 Old myocardial infarction: Secondary | ICD-10-CM | POA: Insufficient documentation

## 2022-12-21 DIAGNOSIS — Z955 Presence of coronary angioplasty implant and graft: Secondary | ICD-10-CM | POA: Diagnosis not present

## 2022-12-21 DIAGNOSIS — K219 Gastro-esophageal reflux disease without esophagitis: Secondary | ICD-10-CM | POA: Diagnosis not present

## 2022-12-21 DIAGNOSIS — Z87891 Personal history of nicotine dependence: Secondary | ICD-10-CM | POA: Insufficient documentation

## 2022-12-21 DIAGNOSIS — G4730 Sleep apnea, unspecified: Secondary | ICD-10-CM | POA: Insufficient documentation

## 2022-12-21 DIAGNOSIS — J449 Chronic obstructive pulmonary disease, unspecified: Secondary | ICD-10-CM | POA: Diagnosis not present

## 2022-12-21 DIAGNOSIS — K812 Acute cholecystitis with chronic cholecystitis: Secondary | ICD-10-CM | POA: Diagnosis not present

## 2022-12-21 HISTORY — PX: CHOLECYSTECTOMY: SHX55

## 2022-12-21 SURGERY — LAPAROSCOPIC CHOLECYSTECTOMY
Anesthesia: General

## 2022-12-21 MED ORDER — CHLORHEXIDINE GLUCONATE 0.12 % MT SOLN
15.0000 mL | Freq: Once | OROMUCOSAL | Status: AC
  Administered 2022-12-21: 15 mL via OROMUCOSAL

## 2022-12-21 MED ORDER — BUPIVACAINE-EPINEPHRINE 0.25% -1:200000 IJ SOLN
INTRAMUSCULAR | Status: DC | PRN
  Administered 2022-12-21: 30 mL

## 2022-12-21 MED ORDER — BUPIVACAINE-EPINEPHRINE 0.25% -1:200000 IJ SOLN
INTRAMUSCULAR | Status: AC
  Filled 2022-12-21: qty 1

## 2022-12-21 MED ORDER — FENTANYL CITRATE (PF) 100 MCG/2ML IJ SOLN
INTRAMUSCULAR | Status: AC
  Filled 2022-12-21: qty 2

## 2022-12-21 MED ORDER — ROCURONIUM BROMIDE 10 MG/ML (PF) SYRINGE
PREFILLED_SYRINGE | INTRAVENOUS | Status: DC | PRN
  Administered 2022-12-21: 70 mg via INTRAVENOUS

## 2022-12-21 MED ORDER — OXYCODONE HCL 5 MG PO TABS: 5.0000 mg | ORAL_TABLET | Freq: Four times a day (QID) | ORAL | 0 refills | Status: DC | PRN

## 2022-12-21 MED ORDER — LIDOCAINE 2% (20 MG/ML) 5 ML SYRINGE
INTRAMUSCULAR | Status: DC | PRN
  Administered 2022-12-21: 60 mg via INTRAVENOUS

## 2022-12-21 MED ORDER — CEFAZOLIN SODIUM-DEXTROSE 2-4 GM/100ML-% IV SOLN
2.0000 g | INTRAVENOUS | Status: AC
  Administered 2022-12-21: 1 g via INTRAVENOUS
  Filled 2022-12-21: qty 100

## 2022-12-21 MED ORDER — LIDOCAINE HCL (PF) 2 % IJ SOLN
INTRAMUSCULAR | Status: DC | PRN
Start: 2022-12-21 — End: 2022-12-21
  Administered 2022-12-21: 1.5 mg/kg/h via INTRADERMAL

## 2022-12-21 MED ORDER — LIDOCAINE HCL (PF) 2 % IJ SOLN
INTRAMUSCULAR | Status: AC
  Filled 2022-12-21: qty 5

## 2022-12-21 MED ORDER — FENTANYL CITRATE PF 50 MCG/ML IJ SOSY
25.0000 ug | PREFILLED_SYRINGE | INTRAMUSCULAR | Status: DC | PRN
  Administered 2022-12-21: 50 ug via INTRAVENOUS

## 2022-12-21 MED ORDER — PHENYLEPHRINE HCL (PRESSORS) 10 MG/ML IV SOLN
INTRAVENOUS | Status: AC
  Filled 2022-12-21: qty 1

## 2022-12-21 MED ORDER — PHENYLEPHRINE 80 MCG/ML (10ML) SYRINGE FOR IV PUSH (FOR BLOOD PRESSURE SUPPORT)
PREFILLED_SYRINGE | INTRAVENOUS | Status: AC
  Filled 2022-12-21: qty 10

## 2022-12-21 MED ORDER — ONDANSETRON HCL 4 MG/2ML IJ SOLN: 4.0000 mg | Freq: Once | INTRAMUSCULAR | Status: DC | PRN

## 2022-12-21 MED ORDER — SUGAMMADEX SODIUM 200 MG/2ML IV SOLN
INTRAVENOUS | Status: DC | PRN
  Administered 2022-12-21: 200 mg via INTRAVENOUS

## 2022-12-21 MED ORDER — CHLORHEXIDINE GLUCONATE CLOTH 2 % EX PADS: 6.0000 | MEDICATED_PAD | Freq: Once | CUTANEOUS | Status: DC

## 2022-12-21 MED ORDER — PHENYLEPHRINE 80 MCG/ML (10ML) SYRINGE FOR IV PUSH (FOR BLOOD PRESSURE SUPPORT)
PREFILLED_SYRINGE | INTRAVENOUS | Status: DC | PRN
  Administered 2022-12-21 (×4): 80 ug via INTRAVENOUS

## 2022-12-21 MED ORDER — PHENYLEPHRINE HCL-NACL 20-0.9 MG/250ML-% IV SOLN
INTRAVENOUS | Status: DC | PRN
  Administered 2022-12-21: 20 ug/min via INTRAVENOUS

## 2022-12-21 MED ORDER — ORAL CARE MOUTH RINSE: 15.0000 mL | Freq: Once | OROMUCOSAL | Status: AC

## 2022-12-21 MED ORDER — LACTATED RINGERS IR SOLN
Status: DC | PRN
Start: 2022-12-21 — End: 2022-12-21
  Administered 2022-12-21: 1000 mL

## 2022-12-21 MED ORDER — OXYCODONE HCL 5 MG PO TABS: 5.0000 mg | ORAL_TABLET | Freq: Once | ORAL | Status: AC | PRN

## 2022-12-21 MED ORDER — DEXAMETHASONE SODIUM PHOSPHATE 4 MG/ML IJ SOLN
INTRAMUSCULAR | Status: DC | PRN
  Administered 2022-12-21: 8 mg via INTRAVENOUS

## 2022-12-21 MED ORDER — PROPOFOL 10 MG/ML IV BOLUS
INTRAVENOUS | Status: DC | PRN
Start: 2022-12-21 — End: 2022-12-21
  Administered 2022-12-21: 100 mg via INTRAVENOUS

## 2022-12-21 MED ORDER — FENTANYL CITRATE PF 50 MCG/ML IJ SOSY
PREFILLED_SYRINGE | INTRAMUSCULAR | Status: AC
  Filled 2022-12-21: qty 1

## 2022-12-21 MED ORDER — LACTATED RINGERS IV SOLN: INTRAVENOUS | Status: DC

## 2022-12-21 MED ORDER — ACETAMINOPHEN 500 MG PO TABS
1000.0000 mg | ORAL_TABLET | ORAL | Status: AC
  Administered 2022-12-21: 1000 mg via ORAL
  Filled 2022-12-21: qty 2

## 2022-12-21 MED ORDER — FENTANYL CITRATE (PF) 100 MCG/2ML IJ SOLN
INTRAMUSCULAR | Status: DC | PRN
  Administered 2022-12-21 (×4): 50 ug via INTRAVENOUS

## 2022-12-21 MED ORDER — OXYCODONE HCL 5 MG PO TABS
ORAL_TABLET | ORAL | Status: AC
  Administered 2022-12-21: 5 mg via ORAL
  Filled 2022-12-21: qty 1

## 2022-12-21 MED ORDER — DEXAMETHASONE SODIUM PHOSPHATE 10 MG/ML IJ SOLN
INTRAMUSCULAR | Status: AC
  Filled 2022-12-21: qty 1

## 2022-12-21 MED ORDER — OXYCODONE HCL 5 MG/5ML PO SOLN: 5.0000 mg | Freq: Once | ORAL | Status: AC | PRN

## 2022-12-21 MED ORDER — AMISULPRIDE (ANTIEMETIC) 5 MG/2ML IV SOLN: 10.0000 mg | Freq: Once | INTRAVENOUS | Status: DC | PRN

## 2022-12-21 MED ORDER — ACETAMINOPHEN 500 MG PO TABS: 1000.0000 mg | ORAL_TABLET | Freq: Once | ORAL | Status: DC

## 2022-12-21 MED ORDER — ONDANSETRON HCL 4 MG/2ML IJ SOLN
INTRAMUSCULAR | Status: AC
  Filled 2022-12-21: qty 2

## 2022-12-21 MED ORDER — PROPOFOL 10 MG/ML IV BOLUS
INTRAVENOUS | Status: AC
  Filled 2022-12-21: qty 20

## 2022-12-21 MED ORDER — ROCURONIUM BROMIDE 10 MG/ML (PF) SYRINGE
PREFILLED_SYRINGE | INTRAVENOUS | Status: AC
  Filled 2022-12-21: qty 10

## 2022-12-21 MED ORDER — 0.9 % SODIUM CHLORIDE (POUR BTL) OPTIME
TOPICAL | Status: DC | PRN
Start: 2022-12-21 — End: 2022-12-21
  Administered 2022-12-21: 1000 mL

## 2022-12-21 SURGICAL SUPPLY — 49 items
APL PRP STRL LF DISP 70% ISPRP (MISCELLANEOUS) ×1
APL SKNCLS STERI-STRIP NONHPOA (GAUZE/BANDAGES/DRESSINGS)
APPLIER CLIP 5 13 M/L LIGAMAX5 (MISCELLANEOUS) ×1
APPLIER CLIP ROT 10 11.4 M/L (STAPLE)
APR CLP MED LRG 11.4X10 (STAPLE)
APR CLP MED LRG 5 ANG JAW (MISCELLANEOUS) ×1
BAG COUNTER SPONGE SURGICOUNT (BAG) IMPLANT
BAG SPEC RTRVL 10 TROC 200 (ENDOMECHANICALS) ×1
BAG SPNG CNTER NS LX DISP (BAG)
BENZOIN TINCTURE PRP APPL 2/3 (GAUZE/BANDAGES/DRESSINGS) IMPLANT
BNDG ADH 1X3 SHEER STRL LF (GAUZE/BANDAGES/DRESSINGS) IMPLANT
BNDG ADH THN 3X1 STRL LF (GAUZE/BANDAGES/DRESSINGS)
CABLE HIGH FREQUENCY MONO STRZ (ELECTRODE) ×1 IMPLANT
CHLORAPREP W/TINT 26 (MISCELLANEOUS) ×1 IMPLANT
CLIP APPLIE 5 13 M/L LIGAMAX5 (MISCELLANEOUS) ×1 IMPLANT
CLIP APPLIE ROT 10 11.4 M/L (STAPLE) IMPLANT
CLIP LIGATING HEM O LOK PURPLE (MISCELLANEOUS) IMPLANT
CLIP LIGATING HEMO O LOK GREEN (MISCELLANEOUS) ×1 IMPLANT
COVER MAYO STAND XLG (MISCELLANEOUS) ×1 IMPLANT
COVER SURGICAL LIGHT HANDLE (MISCELLANEOUS) ×1 IMPLANT
DRAIN CHANNEL 19F RND (DRAIN) IMPLANT
DRAPE C-ARM 42X120 X-RAY (DRAPES) IMPLANT
DRSG TEGADERM 2-3/8X2-3/4 SM (GAUZE/BANDAGES/DRESSINGS) IMPLANT
EVACUATOR SILICONE 100CC (DRAIN) IMPLANT
GAUZE SPONGE 2X2 8PLY STRL LF (GAUZE/BANDAGES/DRESSINGS) IMPLANT
GLOVE BIOGEL PI IND STRL 7.0 (GLOVE) ×1 IMPLANT
GLOVE SURG SS PI 7.0 STRL IVOR (GLOVE) ×1 IMPLANT
GOWN STRL REUS W/ TWL LRG LVL3 (GOWN DISPOSABLE) ×1 IMPLANT
GOWN STRL REUS W/TWL LRG LVL3 (GOWN DISPOSABLE) ×1
GRASPER SUT TROCAR 14GX15 (MISCELLANEOUS) ×1 IMPLANT
IRRIG SUCT STRYKERFLOW 2 WTIP (MISCELLANEOUS) ×1
IRRIGATION SUCT STRKRFLW 2 WTP (MISCELLANEOUS) ×1 IMPLANT
KIT BASIN OR (CUSTOM PROCEDURE TRAY) ×1 IMPLANT
KIT TURNOVER KIT A (KITS) IMPLANT
POUCH RETRIEVAL ECOSAC 10 (ENDOMECHANICALS) ×1 IMPLANT
SCISSORS LAP 5X35 DISP (ENDOMECHANICALS) ×1 IMPLANT
SET CHOLANGIOGRAPH MIX (MISCELLANEOUS) IMPLANT
SET TUBE SMOKE EVAC HIGH FLOW (TUBING) ×1 IMPLANT
SLEEVE Z-THREAD 5X100MM (TROCAR) ×2 IMPLANT
SPIKE FLUID TRANSFER (MISCELLANEOUS) ×1 IMPLANT
STRIP CLOSURE SKIN 1/2X4 (GAUZE/BANDAGES/DRESSINGS) IMPLANT
SUT ETHILON 2 0 PS N (SUTURE) IMPLANT
SUT MNCRL AB 4-0 PS2 18 (SUTURE) ×1 IMPLANT
SUT VICRYL 0 ENDOLOOP (SUTURE) IMPLANT
TOWEL OR 17X26 10 PK STRL BLUE (TOWEL DISPOSABLE) ×1 IMPLANT
TOWEL OR NON WOVEN STRL DISP B (DISPOSABLE) IMPLANT
TRAY LAPAROSCOPIC (CUSTOM PROCEDURE TRAY) ×1 IMPLANT
TROCAR ADV FIXATION 12X100MM (TROCAR) ×1 IMPLANT
TROCAR Z-THREAD OPTICAL 5X100M (TROCAR) ×1 IMPLANT

## 2022-12-21 NOTE — H&P (Signed)
Chief Complaint: New Consultation ( RE-CHECK - s/p IR perc chole placement)   History of Present Illness: Robert Lang is a 72 y.o. male who is seen today for gallbladder follow up.  Rocky Link was in the hospital in June for cholecystitis and pulmonary embolus. He was treated with antibiotics and IR placement of cholecystostomy. He is on eliquis. He had the drain clog one time and it was upsized. He had bad abdominal pain during the clog. He has no abdominal pain today. He is tolerating a diet but does not have much appetite. He saw his cardiologist yesterday who thinks he is moderate risk and optimized. He sees his pulmonologist latter today.  Review of Systems: A complete review of systems was obtained from the patient. I have reviewed this information and discussed as appropriate with the patient. See HPI as well for other ROS.  Review of Systems  Constitutional: Positive for malaise/fatigue and weight loss.  HENT: Negative.  Eyes: Negative.  Respiratory: Positive for shortness of breath.  Cardiovascular: Negative.  Gastrointestinal: Negative.  Genitourinary: Negative.  Musculoskeletal: Negative.  Skin: Negative.  Neurological: Negative.  Endo/Heme/Allergies: Negative.  Psychiatric/Behavioral: Negative.    Medical History: Past Medical History:  Diagnosis Date  Anxiety  Arrhythmia  Arthritis  Atrial flutter (CMS/HHS-HCC)  COPD (chronic obstructive pulmonary disease) (CMS/HHS-HCC)  GERD (gastroesophageal reflux disease)  Hypertension  Sleep apnea   There is no problem list on file for this patient.  Past Surgical History:  Procedure Laterality Date  INGUINAL HERNIA REPAIR 2002  heart stent 2012  leminactomy 04/04/2015  CATARACT EXTRACTION Right 05/2015  shocked heart 05/2015  CATARACT EXTRACTION Left 06/2015  ABLATION ARRYTHMIA FOCUS 10/2015  heart attack 01/10/2019  heart stent 01/10/2019  APPENDECTOMY  HERNIA REPAIR    Allergies  Allergen Reactions   Naproxen Other (See Comments)  (Naprosyn *ANALGESICS - ANTI-INFLAMMATORY*) Nausea, Abdominal pain (Naprosyn *ANALGESICS - ANTI-INFLAMMATORY*) Nausea, Abdominal pain (Naprosyn *ANALGESICS - ANTI-INFLAMMATORY*) Nausea, Abdominal pain (Naprosyn *ANALGESICS - ANTI-INFLAMMATORY*) Nausea, Abdominal pain   Current Outpatient Medications on File Prior to Visit  Medication Sig Dispense Refill  apixaban (ELIQUIS) 5 mg tablet Take 5 mg by mouth 2 (two) times daily  lamoTRIgine (LAMICTAL) 100 MG tablet Take 100 mg by mouth once daily  LORazepam (ATIVAN) 0.5 MG tablet Take one tablet by mouth only for severe anxiety or agitation  oxyCODONE (ROXICODONE) 5 MG immediate release tablet Take by mouth  acetaminophen (TYLENOL) 325 MG tablet Take 2 tablets (650 mg total) by mouth every 6 (six) hours as needed  atorvastatin (LIPITOR) 80 MG tablet  buPROPion (WELLBUTRIN XL) 300 MG XL tablet Take by mouth  busPIRone (BUSPAR) 7.5 MG tablet  cephalexin (KEFLEX) 500 MG capsule  cholecalciferol (VITAMIN D3) 1000 unit tablet Take by mouth  cyanocobalamin (VITAMIN B12) 1000 MCG tablet Take by mouth  docusate (COLACE) 100 MG capsule Take 1 capsule (100 mg total) by mouth  enalapril (VASOTEC) 2.5 MG tablet Take by mouth  famotidine (PEPCID) 40 MG tablet Take by mouth  finasteride (PROSCAR) 5 mg tablet Take 1 tablet (5 mg total) by mouth once daily  gabapentin (NEURONTIN) 100 MG capsule TAKE 4 CAPSULES BY MOUTH AT BEDTIME  ginger root (GINGER, ZINGIBER OFFICINALIS,) 550 mg Cap Take by mouth  ibuprofen (MOTRIN) 200 MG tablet Take by mouth  melatonin 5 mg Tab Take 2 tablets (10 mg total) by mouth  multivitamin with minerals, EYE, (PRESERVISION AREDS-2) soft gel capsule Take by mouth  nitroGLYcerin (NITROSTAT) 0.4 MG SL tablet Place under the tongue  pantoprazole (PROTONIX) 40 MG DR tablet  pirfenidone 801 mg Tab Take by mouth  polyethylene glycol (MIRALAX) powder Take by mouth  predniSONE (DELTASONE) 10 MG tablet  FOLLOW PRINTED INSTRUCTIONS.  ranolazine (RANEXA) 1,000 mg ER tablet  rivaroxaban (XARELTO) 20 mg tablet  sertraline (ZOLOFT) 100 MG tablet Take by mouth  solifenacin (VESICARE) 5 MG tablet Take 1 tablet (5 mg total) by mouth once daily  traZODone (DESYREL) 50 MG tablet  turmeric/turmeric ext/pepr ext (TURMERIC-TURMERIC EXT-PEPPER) 500-3 mg Cap Take by mouth   No current facility-administered medications on file prior to visit.   Family History  Problem Relation Age of Onset  Anxiety Mother  High blood pressure (Hypertension) Mother  Hyperlipidemia (Elevated cholesterol) Mother  Alcohol abuse Father  Diabetes Father  Colon cancer Father  Alcohol abuse Brother  Depression Brother  Throat cancer Brother    Social History   Tobacco Use  Smoking Status Former  Current packs/day: 0.00  Types: Cigarettes  Quit date: 01/10/2019  Years since quitting: 3.7  Smokeless Tobacco Never    Social History   Socioeconomic History  Marital status: Married  Tobacco Use  Smoking status: Former  Current packs/day: 0.00  Types: Cigarettes  Quit date: 01/10/2019  Years since quitting: 3.7  Smokeless tobacco: Never  Substance and Sexual Activity  Alcohol use: Never  Drug use: Never   Social Determinants of Health   Financial Resource Strain: Low Risk (12/22/2021)  Received from [facility]  Overall Financial Resource Strain (CARDIA)  Difficulty of Paying Living Expenses: Not hard at all  Food Insecurity: No Food Insecurity (10/06/2022)  Received from [facility]  Hunger Vital Sign  Worried About Running Out of Food in the Last Year: Never true  Ran Out of Food in the Last Year: Never true  Transportation Needs: No Transportation Needs (10/06/2022)  Received from [facility] - Transportation  Lack of Transportation (Medical): No  Lack of Transportation (Non-Medical): No  Physical Activity: Inactive (12/22/2021)  Received from [facility]  Exercise Vital Sign  Days of  Exercise per Week: 0 days  Minutes of Exercise per Session: 0 min  Stress: No Stress Concern Present (12/22/2021)  Received from [facility] - Occupational Stress Questionnaire  Feeling of Stress : Not at all  Social Connections: Moderately Integrated (12/22/2021)  Received from [facility]  Social Connection and Isolation Panel [NHANES]  Frequency of Communication with Friends and Family: More than three times a week  Frequency of Social Gatherings with Friends and Family: More than three times a week  Attends Religious Services: More than 4 times per year  Active Member of Clubs or Organizations: No  Attends Banker Meetings: Never  Marital Status: Married   Objective:   Vitals:  10/13/22 0854  BP: 106/79  Pulse: 89  Temp: 36.7 C (98 F)  SpO2: 93%  Weight: 91.2 kg (201 lb)  Height: 180.3 cm (5\' 11" )   Body mass index is 28.03 kg/m.  Physical Exam Constitutional:  Appearance: Normal appearance.  HENT:  Head: Normocephalic and atraumatic.  Pulmonary:  Effort: Pulmonary effort is normal.  Abdominal:  Comments: Drain in place with bilious output, no abdominal tenderness  Musculoskeletal:  General: Normal range of motion.  Cervical back: Normal range of motion.  Neurological:  General: No focal deficit present.  Mental Status: He is alert and oriented to person, place, and time. Mental status is at baseline.  Psychiatric:  Mood and Affect: Mood normal.  Behavior: Behavior normal.  Thought Content: Thought content normal.     Assessment and Plan:   Diagnoses and all orders for this visit:  Chronic respiratory failure with hypoxia (CMS/HHS-HCC)  Ischemic heart disease  Chronic cholecystitis    We discussed the etiology of gallstones and probably cause pain. We discussed exacerbating factors including fatty meals. We discussed the details of surgery for removal of the gallbladder including general  anesthesia, 4 small incisions in the patient's abdomen, removal of the patient's gallbladder with the liver and common bile duct, and most likely outpatient procedure. We discussed risks of common bile duct injury, cystic duct stump leak, injury to liver, bleeding, infection, need for open procedure, and post cholecystectomy syndrome. The patient showed good understanding and wanted to proceed with laparoscopic cholecystectomy after ECHO and pulmonology recommendations.  I think he is moderate risk given his heart disease and lung disease now in chronic respiratory failure with hypoxia. However, he had immediate pain with the drain clog so I think he either needs the drain lifelong or to have the gallbladder removed. He is adamant at getting the drain removed as soon as possible.

## 2022-12-21 NOTE — Op Note (Signed)
PATIENT:  Robert Lang  72 y.o. male  PRE-OPERATIVE DIAGNOSIS:  ACUTE CHOLECYSTITIS  POST-OPERATIVE DIAGNOSIS:  ACUTE CHOLECYSTITIS  PROCEDURE:  Procedure(s): LAPAROSCOPIC CHOLECYSTECTOMY AND REMOVAL OF BILIARY DRAIN   SURGEON:  Herbert Marken, De Blanch, MD   ASSISTANT: Reatha Harps, M.D. Due to the complexity of the case Dr. Hillery Hunter was integral to the procedure and present for all key portions  ANESTHESIA:   local and general  Indications for procedure: Robert Lang is a 72 y.o. male with symptoms of Abdominal pain and Nausea and vomiting consistent with gallbladder disease, Confirmed by ultrasound and CT.  Description of procedure: The patient was brought into the operative suite, placed supine. Anesthesia was administered with endotracheal tube. Patient was strapped in place and foot board was secured. All pressure points were offloaded by foam padding. The patient was prepped and draped in the usual sterile fashion.  A left subcostal incision was made and optical entry was used to enter the abdomen. 2 5 mm trocars were placed on in the right lateral space on in the right subcostal space. A 12mm trocar was placed in the epigastric space. Marcaine was infused to the subxiphoid space and lateral upper right abdomen in the transversus abdominis plane. Next the patient was placed in reverse trendelenberg. The gallbladder appearedchronically inflamed. Omentum was adhered to the gallbladder and was taken down with cautery/blunt dissection.  The gallbladder was retracted cephalad and lateral. The peritoneum was reflected off the infundibulum working lateral to medial. The cystic duct and cystic artery were identified and further dissection revealed a critical view. The cholecystostomy tube was removed without issue. The cystic duct and cystic artery were doubly clipped and ligated.   The gallbladder was removed off the liver bed with cautery. The Gallbladder was placed in a specimen bag.  The gallbladder fossa was irrigated and hemostasis was applied with cautery. The gallbladder was removed via the 12mm trocar. The fascial defect was closed with interrupted 0 vicryl suture via laparoscopic trans-fascial suture passer. Pneumoperitoneum was removed, all trocar were removed. All incisions were closed with 4-0 monocryl subcuticular stitch. The patient woke from anesthesia and was brought to PACU in stable condition. All counts were correct  Findings: chronic cholecystitis  Specimen: gallbladder  Blood loss: 20 ml  Local anesthesia: 30 ml Marcaine  Complications: none  PLAN OF CARE: Discharge to home after PACU  PATIENT DISPOSITION:  PACU - hemodynamically stable.  De Blanch [facility], [place]

## 2022-12-21 NOTE — Transfer of Care (Signed)
Immediate Anesthesia Transfer of Care Note  Patient: Robert Lang  Procedure(s) Performed: Procedure(s): LAPAROSCOPIC CHOLECYSTECTOMY AND REMOVAL OF BILIARY DRAIN (N/A)  Patient Location: PACU  Anesthesia Type:General  Level of Consciousness: Patient easily awoken, sedated, comfortable, cooperative, following commands, responds to stimulation.   Airway & Oxygen Therapy: Patient spontaneously breathing, ventilating well, oxygen via simple oxygen mask.  Post-op Assessment: Report given to PACU RN, vital signs reviewed and stable, moving all extremities.   Post vital signs: Reviewed and stable.  Complications: No apparent anesthesia complications  Last Vitals:  Vitals Value Taken Time  BP 142/69 12/21/22 0907  Temp    Pulse 63 12/21/22 0907  Resp 14 12/21/22 0907  SpO2 96 % 12/21/22 0907  Vitals shown include unfiled device data.  Last Pain:  Vitals:   12/21/22 0535  TempSrc: Oral         Complications:  Encounter Notable Events  Notable Event Outcome Phase Comment  Difficult to intubate - expected  Intraprocedure Filed from anesthesia note documentation.

## 2022-12-21 NOTE — Discharge Instructions (Signed)
[facility] ______[facility] LAPAROSCOPIC SURGERY: POST OP INSTRUCTIONS Always review your discharge instruction sheet given to you by the facility where your surgery was performed. IF YOU HAVE DISABILITY OR FAMILY LEAVE FORMS, YOU MUST BRING THEM TO THE OFFICE FOR PROCESSING.   DO NOT GIVE THEM TO YOUR DOCTOR.  A prescription for pain medication may be given to you upon discharge.  Take your pain medication as prescribed, if needed.  If narcotic pain medicine is not needed, then you may take acetaminophen (Tylenol) or ibuprofen (Advil) as needed. Take your usually prescribed medications unless otherwise directed. If you need a refill on your pain medication, please contact your pharmacy.  They will contact our office to request authorization. Prescriptions will not be filled after 5pm or on week-ends. You should follow a light diet the first few days after arrival home, such as soup and crackers, etc.  Be sure to include lots of fluids daily. Most patients will experience some swelling and bruising in the area of the incisions.  Ice packs will help.  Swelling and bruising can take several days to resolve.  It is common to experience some constipation if taking pain medication after surgery.  Increasing fluid intake and taking a stool softener (such as Colace) will usually help or prevent this problem from occurring.  A mild laxative (Milk of Magnesia or Miralax) should be taken according to package instructions if there are no bowel movements after 48 hours. Unless discharge instructions indicate otherwise, you may remove your bandages 24-48 hours after surgery, and you may shower at that time.  You may have steri-strips (small skin tapes) in place directly over the incision.  These strips should be left on the skin for 7-10 days.  If your surgeon used skin glue on the incision, you may shower in 24 hours.  The glue will flake off over the next 2-3 weeks.  Any sutures or staples will be  removed at the office during your follow-up visit. ACTIVITIES:  You may resume regular (light) daily activities beginning the next day--such as daily self-care, walking, climbing stairs--gradually increasing activities as tolerated.  You may have sexual intercourse when it is comfortable.  Refrain from any heavy lifting or straining until approved by your doctor. You may drive when you are no longer taking prescription pain medication, you can comfortably wear a seatbelt, and you can safely maneuver your car and apply brakes. RETURN TO WORK:  __________________________________________________________ You should see your doctor in the office for a follow-up appointment approximately 2-3 weeks after your surgery.  Make sure that you call for this appointment within a day or two after you arrive home to insure a convenient appointment time. OTHER INSTRUCTIONS: __________________________________________________________________________________________________________________________ __________________________________________________________________________________________________________________________ WHEN TO CALL YOUR DOCTOR: Fever over 101.0 Inability to urinate Continued bleeding from incision. Increased pain, redness, or drainage from the incision. Increasing abdominal pain  The clinic staff is available to answer your questions during regular business hours.  Please don't hesitate to call and ask to speak to one of the nurses for clinical concerns.  If you have a medical emergency, go to the nearest emergency room or call 911.  A surgeon from [facility] is always on call at the hospital. [address], [address], [place], [place]  [address] ? [address] [address], [place], [place]   [address] [phone] ? [phone] ? FAX [phone] Web site: [email or web address]  

## 2022-12-21 NOTE — Anesthesia Procedure Notes (Addendum)
Procedure Name: Intubation Date/Time: 12/21/2022 7:45 AM  Performed by: Ludwig Lean, CRNAPre-anesthesia Checklist: Patient identified, Emergency Drugs available, Suction available and Patient being monitored Patient Re-evaluated:Patient Re-evaluated prior to induction Oxygen Delivery Method: Circle system utilized Preoxygenation: Pre-oxygenation with 100% oxygen Induction Type: IV induction Ventilation: Mask ventilation without difficulty Laryngoscope Size: Mac and 4 Grade View: Grade II Tube type: Oral Tube size: 7.5 mm Number of attempts: 1 Airway Equipment and Method: Stylet Placement Confirmation: ETT inserted through vocal cords under direct vision, positive ETCO2 and breath sounds checked- equal and bilateral Secured at: 23 cm Tube secured with: Tape Dental Injury: Teeth and Oropharynx as per pre-operative assessment

## 2022-12-21 NOTE — Anesthesia Postprocedure Evaluation (Signed)
Anesthesia Post Note  Patient: Athanasius Kesling  Procedure(s) Performed: LAPAROSCOPIC CHOLECYSTECTOMY AND REMOVAL OF BILIARY DRAIN     Patient location during evaluation: PACU Anesthesia Type: General Level of consciousness: awake and alert, oriented and patient cooperative Pain management: pain level controlled Vital Signs Assessment: post-procedure vital signs reviewed and stable Respiratory status: spontaneous breathing, nonlabored ventilation and respiratory function stable Cardiovascular status: blood pressure returned to baseline and stable Postop Assessment: no apparent nausea or vomiting Anesthetic complications: yes    Last Vitals:  Vitals:   12/21/22 0923 12/21/22 0927  BP:    Pulse: 87 84  Resp: 13 12  Temp:    SpO2: 94% 93%    Last Pain:  Vitals:   12/21/22 0923  TempSrc:   PainSc: 6                  Lannie Fields

## 2022-12-22 ENCOUNTER — Encounter (HOSPITAL_COMMUNITY): Payer: Self-pay | Admitting: General Surgery

## 2022-12-22 ENCOUNTER — Telehealth: Payer: Self-pay | Admitting: Internal Medicine

## 2022-12-22 LAB — SURGICAL PATHOLOGY

## 2022-12-22 NOTE — Telephone Encounter (Signed)
Pirfenidone (ESBRIET) 801 MG TABS

## 2022-12-23 DIAGNOSIS — I2510 Atherosclerotic heart disease of native coronary artery without angina pectoris: Secondary | ICD-10-CM | POA: Diagnosis not present

## 2022-12-23 DIAGNOSIS — J84112 Idiopathic pulmonary fibrosis: Secondary | ICD-10-CM | POA: Diagnosis not present

## 2022-12-23 DIAGNOSIS — F1010 Alcohol abuse, uncomplicated: Secondary | ICD-10-CM | POA: Diagnosis not present

## 2022-12-23 DIAGNOSIS — I4892 Unspecified atrial flutter: Secondary | ICD-10-CM | POA: Diagnosis not present

## 2022-12-23 DIAGNOSIS — I452 Bifascicular block: Secondary | ICD-10-CM | POA: Diagnosis not present

## 2022-12-23 DIAGNOSIS — J9601 Acute respiratory failure with hypoxia: Secondary | ICD-10-CM | POA: Diagnosis not present

## 2022-12-23 DIAGNOSIS — J432 Centrilobular emphysema: Secondary | ICD-10-CM | POA: Diagnosis not present

## 2022-12-23 DIAGNOSIS — F411 Generalized anxiety disorder: Secondary | ICD-10-CM | POA: Diagnosis not present

## 2022-12-23 DIAGNOSIS — I252 Old myocardial infarction: Secondary | ICD-10-CM | POA: Diagnosis not present

## 2022-12-23 DIAGNOSIS — I2693 Single subsegmental pulmonary embolism without acute cor pulmonale: Secondary | ICD-10-CM | POA: Diagnosis not present

## 2022-12-23 DIAGNOSIS — E7800 Pure hypercholesterolemia, unspecified: Secondary | ICD-10-CM | POA: Diagnosis not present

## 2022-12-23 DIAGNOSIS — G629 Polyneuropathy, unspecified: Secondary | ICD-10-CM | POA: Diagnosis not present

## 2022-12-23 DIAGNOSIS — G4733 Obstructive sleep apnea (adult) (pediatric): Secondary | ICD-10-CM | POA: Diagnosis not present

## 2022-12-23 DIAGNOSIS — I10 Essential (primary) hypertension: Secondary | ICD-10-CM | POA: Diagnosis not present

## 2022-12-23 DIAGNOSIS — I330 Acute and subacute infective endocarditis: Secondary | ICD-10-CM | POA: Diagnosis not present

## 2022-12-23 DIAGNOSIS — J690 Pneumonitis due to inhalation of food and vomit: Secondary | ICD-10-CM | POA: Diagnosis not present

## 2022-12-24 ENCOUNTER — Other Ambulatory Visit: Payer: Self-pay | Admitting: Pharmacist

## 2022-12-24 DIAGNOSIS — I252 Old myocardial infarction: Secondary | ICD-10-CM | POA: Diagnosis not present

## 2022-12-24 DIAGNOSIS — I4892 Unspecified atrial flutter: Secondary | ICD-10-CM | POA: Diagnosis not present

## 2022-12-24 DIAGNOSIS — F411 Generalized anxiety disorder: Secondary | ICD-10-CM | POA: Diagnosis not present

## 2022-12-24 DIAGNOSIS — I330 Acute and subacute infective endocarditis: Secondary | ICD-10-CM | POA: Diagnosis not present

## 2022-12-24 DIAGNOSIS — J690 Pneumonitis due to inhalation of food and vomit: Secondary | ICD-10-CM | POA: Diagnosis not present

## 2022-12-24 DIAGNOSIS — J84112 Idiopathic pulmonary fibrosis: Secondary | ICD-10-CM

## 2022-12-24 DIAGNOSIS — I2510 Atherosclerotic heart disease of native coronary artery without angina pectoris: Secondary | ICD-10-CM | POA: Diagnosis not present

## 2022-12-24 DIAGNOSIS — I2693 Single subsegmental pulmonary embolism without acute cor pulmonale: Secondary | ICD-10-CM | POA: Diagnosis not present

## 2022-12-24 DIAGNOSIS — G4733 Obstructive sleep apnea (adult) (pediatric): Secondary | ICD-10-CM | POA: Diagnosis not present

## 2022-12-24 DIAGNOSIS — F1010 Alcohol abuse, uncomplicated: Secondary | ICD-10-CM | POA: Diagnosis not present

## 2022-12-24 DIAGNOSIS — J9601 Acute respiratory failure with hypoxia: Secondary | ICD-10-CM | POA: Diagnosis not present

## 2022-12-24 DIAGNOSIS — I452 Bifascicular block: Secondary | ICD-10-CM | POA: Diagnosis not present

## 2022-12-24 DIAGNOSIS — I10 Essential (primary) hypertension: Secondary | ICD-10-CM | POA: Diagnosis not present

## 2022-12-24 DIAGNOSIS — E7800 Pure hypercholesterolemia, unspecified: Secondary | ICD-10-CM | POA: Diagnosis not present

## 2022-12-24 DIAGNOSIS — G629 Polyneuropathy, unspecified: Secondary | ICD-10-CM | POA: Diagnosis not present

## 2022-12-24 DIAGNOSIS — J432 Centrilobular emphysema: Secondary | ICD-10-CM | POA: Diagnosis not present

## 2022-12-24 MED ORDER — PIRFENIDONE 801 MG PO TABS
801.0000 mg | ORAL_TABLET | Freq: Three times a day (TID) | ORAL | 1 refills | Status: DC
Start: 2022-12-24 — End: 2023-03-28

## 2022-12-24 NOTE — Telephone Encounter (Signed)
Refill sent for ESBRIET to [facility]) for Esbriet: [phone]  Dose: 801mg  three times daily  Last OV: 11/25/2022 Provider: Dr. Marchelle Gearing Pertinent labs: CMET on 10/09/2022 wnl  Next OV: 04/25/2023 (w Dr. Maple Hudson)  Routing to scheduling team for follow-up on appt scheduling  Chesley Mires, PharmD, MPH, BCPS Clinical Pharmacist (Rheumatology and Pulmonology)

## 2022-12-26 NOTE — Telephone Encounter (Signed)
Approved through 10/05/2023

## 2022-12-27 DIAGNOSIS — G629 Polyneuropathy, unspecified: Secondary | ICD-10-CM | POA: Diagnosis not present

## 2022-12-27 DIAGNOSIS — G4733 Obstructive sleep apnea (adult) (pediatric): Secondary | ICD-10-CM | POA: Diagnosis not present

## 2022-12-27 DIAGNOSIS — I2693 Single subsegmental pulmonary embolism without acute cor pulmonale: Secondary | ICD-10-CM | POA: Diagnosis not present

## 2022-12-27 DIAGNOSIS — F1010 Alcohol abuse, uncomplicated: Secondary | ICD-10-CM | POA: Diagnosis not present

## 2022-12-27 DIAGNOSIS — J84112 Idiopathic pulmonary fibrosis: Secondary | ICD-10-CM | POA: Diagnosis not present

## 2022-12-27 DIAGNOSIS — I10 Essential (primary) hypertension: Secondary | ICD-10-CM | POA: Diagnosis not present

## 2022-12-27 DIAGNOSIS — I452 Bifascicular block: Secondary | ICD-10-CM | POA: Diagnosis not present

## 2022-12-27 DIAGNOSIS — I2510 Atherosclerotic heart disease of native coronary artery without angina pectoris: Secondary | ICD-10-CM | POA: Diagnosis not present

## 2022-12-27 DIAGNOSIS — J9601 Acute respiratory failure with hypoxia: Secondary | ICD-10-CM | POA: Diagnosis not present

## 2022-12-27 DIAGNOSIS — I330 Acute and subacute infective endocarditis: Secondary | ICD-10-CM | POA: Diagnosis not present

## 2022-12-27 DIAGNOSIS — F411 Generalized anxiety disorder: Secondary | ICD-10-CM | POA: Diagnosis not present

## 2022-12-27 DIAGNOSIS — I4892 Unspecified atrial flutter: Secondary | ICD-10-CM | POA: Diagnosis not present

## 2022-12-27 DIAGNOSIS — J690 Pneumonitis due to inhalation of food and vomit: Secondary | ICD-10-CM | POA: Diagnosis not present

## 2022-12-27 DIAGNOSIS — E7800 Pure hypercholesterolemia, unspecified: Secondary | ICD-10-CM | POA: Diagnosis not present

## 2022-12-27 DIAGNOSIS — J432 Centrilobular emphysema: Secondary | ICD-10-CM | POA: Diagnosis not present

## 2022-12-27 DIAGNOSIS — I252 Old myocardial infarction: Secondary | ICD-10-CM | POA: Diagnosis not present

## 2022-12-27 NOTE — Telephone Encounter (Signed)
Refill sent.

## 2022-12-28 ENCOUNTER — Ambulatory Visit: Payer: Medicare Other | Admitting: Behavioral Health

## 2022-12-28 ENCOUNTER — Telehealth: Payer: Self-pay

## 2022-12-28 ENCOUNTER — Encounter: Payer: Self-pay | Admitting: Behavioral Health

## 2022-12-28 DIAGNOSIS — G4700 Insomnia, unspecified: Secondary | ICD-10-CM

## 2022-12-28 DIAGNOSIS — F32A Depression, unspecified: Secondary | ICD-10-CM | POA: Diagnosis not present

## 2022-12-28 DIAGNOSIS — F411 Generalized anxiety disorder: Secondary | ICD-10-CM

## 2022-12-28 DIAGNOSIS — Z0389 Encounter for observation for other suspected diseases and conditions ruled out: Secondary | ICD-10-CM

## 2022-12-28 DIAGNOSIS — R454 Irritability and anger: Secondary | ICD-10-CM | POA: Diagnosis not present

## 2022-12-28 MED ORDER — SERTRALINE HCL 100 MG PO TABS: 200.0000 mg | ORAL_TABLET | Freq: Every day | ORAL | 1 refills | Status: DC

## 2022-12-28 MED ORDER — TRAZODONE HCL 50 MG PO TABS
50.0000 mg | ORAL_TABLET | Freq: Every day | ORAL | 1 refills | Status: DC
Start: 2022-12-28 — End: 2023-07-01

## 2022-12-28 MED ORDER — LAMOTRIGINE 100 MG PO TABS
100.0000 mg | ORAL_TABLET | Freq: Every day | ORAL | 1 refills | Status: DC
Start: 2022-12-28 — End: 2023-05-09

## 2022-12-28 MED ORDER — BUSPIRONE HCL 15 MG PO TABS
15.0000 mg | ORAL_TABLET | Freq: Three times a day (TID) | ORAL | 3 refills | Status: DC
Start: 2022-12-28 — End: 2023-05-09

## 2022-12-28 NOTE — Progress Notes (Deleted)
Pt did not show for scheduled appt and did not provide 24 hour notice as required. Additional fees to be assessed.  

## 2022-12-28 NOTE — Patient Outreach (Signed)
Care Coordination   12/28/2022 Name: Robert Lang MRN: [id number] DOB: [birth year]   Care Coordination Outreach Attempts:  An unsuccessful telephone outreach was attempted for a scheduled appointment today.  Follow Up Plan:  Additional outreach attempts will be made to offer the patient care coordination information and services.   Encounter Outcome:  No Answer   Care Coordination Interventions:  No, not indicated    Kathyrn Sheriff, RN, MSN, BSN, CCM Care Management Coordinator [phone]

## 2022-12-28 NOTE — Progress Notes (Signed)
[facility]  Patient ID: Robert Lang,  MRN: [id number]  PCP: [name], [name]  Date of Evaluation: 12/28/2022 Time spent:30 minutes  Chief Complaint:  Chief Complaint   Anxiety; Depression; Follow-up; Medication Refill; Patient Education     HISTORY/CURRENT STATUS: HPI  72 yo Robert Lang male presents for follow up and medication management. His wife was with him today with his consent.  Says he has noticed some improvement since increasing Zoloft and adding Lamictal.  Is on the road to recovery since having his abdominal drain removed. Says he will start going to church again. Wife says he has been doing good. Sleep has improved. Sleeps with C-pap.  Rates anxiety 3/10 and depression 3/10. Denies hx of mania, no psychosis. No auditory or visual hallucinations. SI or HI      Individual Medical History/ Review of Systems: Changes? :No   Allergies: Naproxen and Rapaflo [silodosin]  Current Medications:  Current Outpatient Medications:    acetaminophen (TYLENOL) 500 MG tablet, Take 1 tablet (500 mg total) by mouth every 8 (eight) hours as needed for moderate pain., Disp: 100 tablet, Rfl: 0   apixaban (ELIQUIS) 5 MG TABS tablet, Take 1 tablet (5 mg total) by mouth 2 (two) times daily., Disp: 60 tablet, Rfl: 0   atorvastatin (LIPITOR) 80 MG tablet, Take 1 tablet (80 mg total) by mouth daily. (Patient taking differently: Take 80 mg by mouth every evening.), Disp: 90 tablet, Rfl: 2   buPROPion (WELLBUTRIN XL) 300 MG 24 hr tablet, Take 1 tablet (300 mg total) by mouth daily., Disp: 90 tablet, Rfl: 1   busPIRone (BUSPAR) 15 MG tablet, Take 1 tablet (15 mg total) by mouth 3 (three) times daily., Disp: 90 tablet, Rfl: 3   Cholecalciferol (VITAMIN D3) 50 MCG (2000 UT) TABS, Take 4,000 Units by mouth 2 (two) times daily., Disp: , Rfl:    docusate sodium (COLACE) 100 MG capsule, Take 100 mg by mouth daily., Disp: , Rfl:    famotidine (PEPCID) 40 MG tablet, TAKE 1 TABLET(40 MG)  BY MOUTH AT BEDTIME, Disp: 90 tablet, Rfl: 2   fexofenadine (ALLEGRA) 180 MG tablet, Take 180 mg by mouth at bedtime. , Disp: , Rfl:    finasteride (PROSCAR) 5 MG tablet, Take 1 tablet (5 mg total) by mouth daily. (Patient taking differently: Take 5 mg by mouth at bedtime.), Disp: 90 tablet, Rfl: 1   lamoTRIgine (LAMICTAL) 100 MG tablet, Take 1 tablet (100 mg total) by mouth daily., Disp: 90 tablet, Rfl: 1   levalbuterol (XOPENEX) 1.25 MG/0.5ML nebulizer solution, Take 1.25 mg by nebulization every 4 (four) hours as needed for wheezing or shortness of breath., Disp: 1 each, Rfl: 12   LORazepam (ATIVAN) 0.5 MG tablet, Take one tablet by mouth only for severe anxiety or agitation (Patient taking differently: Take 0.5 mg by mouth daily as needed for anxiety. Take one tablet by mouth only for severe anxiety or agitation. Has not taking medication yet.), Disp: 30 tablet, Rfl: 1   Melatonin 10 MG TABS, Take 10 mg by mouth at bedtime., Disp: , Rfl:    MUCINEX 600 MG 12 hr tablet, TAKE 2 TABLETS BY MOUTH TWICE DAILY (Patient taking differently: Take 1,200 mg by mouth 2 (two) times daily as needed for cough or to loosen phlegm.), Disp: 120 tablet, Rfl: 0   Multiple Vitamins-Minerals (PRESERVISION AREDS PO), Take 1 capsule by mouth in the morning and at bedtime., Disp: , Rfl:    nitroGLYCERIN (NITROSTAT) 0.4 MG SL tablet, Place 1  tablet (0.4 mg total) under the tongue every 5 (five) minutes as needed for chest pain., Disp: 25 tablet, Rfl: 3   ondansetron (ZOFRAN) 4 MG tablet, Take 1 tablet (4 mg total) by mouth every 8 (eight) hours as needed for nausea or vomiting., Disp: 20 tablet, Rfl: 0   oxyCODONE (OXY IR/ROXICODONE) 5 MG immediate release tablet, Take 1 tablet (5 mg total) by mouth every 6 (six) hours as needed for severe pain., Disp: 15 tablet, Rfl: 0   pantoprazole (PROTONIX) 40 MG tablet, TAKE 1 TABLET(40 MG) BY MOUTH DAILY, Disp: 90 tablet, Rfl: 1   Pirfenidone (ESBRIET) 801 MG TABS, Take 1 tablet (801  mg total) by mouth 3 (three) times daily., Disp: 270 tablet, Rfl: 1   polyethylene glycol powder (GLYCOLAX/MIRALAX) 17 GM/SCOOP powder, Take 17 g by mouth daily., Disp: , Rfl:    pregabalin (LYRICA) 150 MG capsule, Take 1 capsule (150 mg total) by mouth 2 (two) times daily., Disp: 90 capsule, Rfl: 5   ranolazine (RANEXA) 1000 MG SR tablet, Take 1 tablet (1,000 mg total) by mouth 2 (two) times daily., Disp: 180 tablet, Rfl: 1   sertraline (ZOLOFT) 100 MG tablet, Take 2 tablets (200 mg total) by mouth at bedtime., Disp: 180 tablet, Rfl: 1   Sodium Chloride Flush (NORMAL SALINE FLUSH) 0.9 % SOLN, Flush drain with 5 mLs daily. Discard remaining 5ml in each syringe., Disp: 200 mL, Rfl: 0   traZODone (DESYREL) 50 MG tablet, Take 1 tablet (50 mg total) by mouth at bedtime., Disp: 90 tablet, Rfl: 1   vitamin B-12 (CYANOCOBALAMIN) 1000 MCG tablet, Take 1,000 mcg by mouth daily., Disp: , Rfl:    zolpidem (AMBIEN) 5 MG tablet, TAKE 1 TABLET(5 MG) BY MOUTH AT BEDTIME AS NEEDED FOR SLEEP, Disp: 30 tablet, Rfl: 1 No current facility-administered medications for this visit.  Facility-Administered Medications Ordered in Other Visits:    regadenoson (LEXISCAN) injection SOLN 0.4 mg, 0.4 mg, Intravenous, Once, Tobb, Kardie, DO   technetium tetrofosmin (TC-MYOVIEW) injection 31.7 millicurie, 31.7 millicurie, Intravenous, Once PRN, Tobb, Kardie, DO Medication Side Effects: none  Family Medical/ Social History: Changes? No  MENTAL HEALTH EXAM:  There were no vitals taken for this visit.There is no height or weight on file to calculate BMI.  General Appearance: Casual, Neat, and Well Groomed  Eye Contact:  Good  Speech:  Clear and Coherent  Volume:  Normal  Mood:  NA  Affect:  Appropriate  Thought Process:  Coherent  Orientation:  Full (Time, Place, and Person)  Thought Content: Logical   Suicidal Thoughts:  No  Homicidal Thoughts:  No  Memory:  WNL  Judgement:  Good  Insight:  Good  Psychomotor  Activity:  Normal  Concentration:  Concentration: Good  Recall:  Good  Fund of Knowledge: Good  Language: Good  Assets:  Desire for Improvement  ADL's:  Intact  Cognition: WNL  Prognosis:  Good    DIAGNOSES:    ICD-10-CM   1. No diagnosis on Axis I  Z03.89     2. Depressive disorder  F32.A sertraline (ZOLOFT) 100 MG tablet    3. Generalized anxiety disorder  F41.1 sertraline (ZOLOFT) 100 MG tablet    busPIRone (BUSPAR) 15 MG tablet    4. Irritability  R45.4 lamoTRIgine (LAMICTAL) 100 MG tablet    5. Insomnia, unspecified type  G47.00 traZODone (DESYREL) 50 MG tablet      Receiving Psychotherapy: No    RECOMMENDATIONS:   Greater than 50% of 30 min  face to face time with patient was spent on counseling and coordination of care. We discussed his significant improvement since last visit.He is more upbeat today and positive. Glad to have drain out.  He is currently happy with his medications and requesting no changes this visit.    We agreed to:   To continue Zoloft to 200  mg daily To continue Trazodone 50 mg at bedtime To continue  Buspar  to 15 mg three times daily To continue Lamictal to 100 mg daily To continue Ativan 0.5 mg once daily prn only for severe anxiety or agitation. To continue Wellbutrin 300 mg XL daily Will report worsening symptoms or side effects promptly To follow up in 8  weeks.  Provided emergency contact information Discussed potential benefits, risk, and side effects of benzodiazepines to include potential risk of tolerance and dependence, as well as possible drowsiness.  Advised patient not to drive if experiencing drowsiness and to take lowest possible effective dose to minimize risk of dependence and tolerance.  Monitor for any sign of rash. Please taking Lamictal and contact office immediately rash develops. Recommend seeking urgent medical attention if rash is severe and/or spreading quickly.  Reviewed PDMP       Joan Flores, NP

## 2022-12-28 NOTE — Addendum Note (Signed)
Addended by: Avelina Laine A on: 12/28/2022 12:01 PM   Modules accepted: Orders, Level of Service

## 2022-12-30 ENCOUNTER — Ambulatory Visit: Payer: Medicare Other

## 2022-12-30 DIAGNOSIS — I10 Essential (primary) hypertension: Secondary | ICD-10-CM | POA: Diagnosis not present

## 2022-12-30 DIAGNOSIS — F1010 Alcohol abuse, uncomplicated: Secondary | ICD-10-CM | POA: Diagnosis not present

## 2022-12-30 DIAGNOSIS — J690 Pneumonitis due to inhalation of food and vomit: Secondary | ICD-10-CM | POA: Diagnosis not present

## 2022-12-30 DIAGNOSIS — G629 Polyneuropathy, unspecified: Secondary | ICD-10-CM | POA: Diagnosis not present

## 2022-12-30 DIAGNOSIS — I330 Acute and subacute infective endocarditis: Secondary | ICD-10-CM | POA: Diagnosis not present

## 2022-12-30 DIAGNOSIS — I2693 Single subsegmental pulmonary embolism without acute cor pulmonale: Secondary | ICD-10-CM | POA: Diagnosis not present

## 2022-12-30 DIAGNOSIS — J84112 Idiopathic pulmonary fibrosis: Secondary | ICD-10-CM | POA: Diagnosis not present

## 2022-12-30 DIAGNOSIS — I4892 Unspecified atrial flutter: Secondary | ICD-10-CM | POA: Diagnosis not present

## 2022-12-30 DIAGNOSIS — J9601 Acute respiratory failure with hypoxia: Secondary | ICD-10-CM | POA: Diagnosis not present

## 2022-12-30 DIAGNOSIS — Z0000 Encounter for general adult medical examination without abnormal findings: Secondary | ICD-10-CM | POA: Diagnosis not present

## 2022-12-30 DIAGNOSIS — F411 Generalized anxiety disorder: Secondary | ICD-10-CM | POA: Diagnosis not present

## 2022-12-30 DIAGNOSIS — J432 Centrilobular emphysema: Secondary | ICD-10-CM | POA: Diagnosis not present

## 2022-12-30 DIAGNOSIS — G4733 Obstructive sleep apnea (adult) (pediatric): Secondary | ICD-10-CM | POA: Diagnosis not present

## 2022-12-30 DIAGNOSIS — E7800 Pure hypercholesterolemia, unspecified: Secondary | ICD-10-CM | POA: Diagnosis not present

## 2022-12-30 DIAGNOSIS — I2510 Atherosclerotic heart disease of native coronary artery without angina pectoris: Secondary | ICD-10-CM | POA: Diagnosis not present

## 2022-12-30 DIAGNOSIS — I252 Old myocardial infarction: Secondary | ICD-10-CM | POA: Diagnosis not present

## 2022-12-30 DIAGNOSIS — I452 Bifascicular block: Secondary | ICD-10-CM | POA: Diagnosis not present

## 2022-12-30 NOTE — Progress Notes (Signed)
Subjective:   Robert Lang is a 72 y.o. male who presents for Medicare Annual/Subsequent preventive examination.  Visit Complete: Virtual I connected with  [name] on 12/30/22 by a audio enabled telemedicine application and verified that I am speaking with the correct person using two identifiers.  Patient Location: Home  Provider Location: Office/Clinic  I discussed the limitations of evaluation and management by telemedicine. The patient expressed understanding and agreed to proceed.  Vital Signs: Because this visit was a virtual/telehealth visit, some criteria may be missing or patient reported. Any vitals not documented were not able to be obtained and vitals that have been documented are patient reported.  Cardiac Risk Factors include: advanced age (>17men, >53 women);hypertension;dyslipidemia;male gender     Objective:    Today's Vitals   12/30/22 1326  PainSc: 0-No pain   There is no height or weight on file to calculate BMI.     12/30/2022    1:36 PM 12/21/2022    5:48 AM 12/15/2022   11:15 AM 10/05/2022    3:38 PM 09/16/2022    6:21 PM 08/29/2022   12:52 AM 08/28/2022    4:24 PM  Advanced Directives  Does Patient Have a Medical Advance Directive? Yes Yes Yes Yes Yes Yes Yes  Type of Estate agent of [place];Living will Healthcare Power of [place];Living will Healthcare Power of [place];Living will Healthcare Power of [employer] of [place];Living will Healthcare Power of [place];Living will Living will;Healthcare Power of Attorney  Does patient want to make changes to medical advance directive? No - Patient declined No - Patient declined  No - Patient declined No - Patient declined No - Patient declined   Copy of Healthcare Power of Attorney in Chart? Yes - validated most recent copy scanned in chart (See row information) No - copy requested   No - copy requested No - copy requested     Current Medications  (verified) Outpatient Encounter Medications as of 12/30/2022  Medication Sig   acetaminophen (TYLENOL) 500 MG tablet Take 1 tablet (500 mg total) by mouth every 8 (eight) hours as needed for moderate pain.   apixaban (ELIQUIS) 5 MG TABS tablet Take 1 tablet (5 mg total) by mouth 2 (two) times daily.   atorvastatin (LIPITOR) 80 MG tablet Take 1 tablet (80 mg total) by mouth daily. (Patient taking differently: Take 80 mg by mouth every evening.)   buPROPion (WELLBUTRIN XL) 300 MG 24 hr tablet Take 1 tablet (300 mg total) by mouth daily.   busPIRone (BUSPAR) 15 MG tablet Take 1 tablet (15 mg total) by mouth 3 (three) times daily.   Cholecalciferol (VITAMIN D3) 50 MCG (2000 UT) TABS Take 4,000 Units by mouth 2 (two) times daily.   docusate sodium (COLACE) 100 MG capsule Take 100 mg by mouth daily.   famotidine (PEPCID) 40 MG tablet TAKE 1 TABLET(40 MG) BY MOUTH AT BEDTIME   fexofenadine (ALLEGRA) 180 MG tablet Take 180 mg by mouth at bedtime.    finasteride (PROSCAR) 5 MG tablet Take 1 tablet (5 mg total) by mouth daily. (Patient taking differently: Take 5 mg by mouth at bedtime.)   lamoTRIgine (LAMICTAL) 100 MG tablet Take 1 tablet (100 mg total) by mouth daily.   levalbuterol (XOPENEX) 1.25 MG/0.5ML nebulizer solution Take 1.25 mg by nebulization every 4 (four) hours as needed for wheezing or shortness of breath.   LORazepam (ATIVAN) 0.5 MG tablet Take one tablet by mouth only for severe anxiety or agitation (Patient taking differently:  Take 0.5 mg by mouth daily as needed for anxiety. Take one tablet by mouth only for severe anxiety or agitation. Has not taking medication yet.)   Melatonin 10 MG TABS Take 10 mg by mouth at bedtime.   Multiple Vitamins-Minerals (PRESERVISION AREDS PO) Take 1 capsule by mouth in the morning and at bedtime.   nitroGLYCERIN (NITROSTAT) 0.4 MG SL tablet Place 1 tablet (0.4 mg total) under the tongue every 5 (five) minutes as needed for chest pain.   pantoprazole  (PROTONIX) 40 MG tablet TAKE 1 TABLET(40 MG) BY MOUTH DAILY   Pirfenidone (ESBRIET) 801 MG TABS Take 1 tablet (801 mg total) by mouth 3 (three) times daily.   polyethylene glycol powder (GLYCOLAX/MIRALAX) 17 GM/SCOOP powder Take 17 g by mouth daily.   pregabalin (LYRICA) 150 MG capsule Take 1 capsule (150 mg total) by mouth 2 (two) times daily.   ranolazine (RANEXA) 1000 MG SR tablet Take 1 tablet (1,000 mg total) by mouth 2 (two) times daily.   sertraline (ZOLOFT) 100 MG tablet Take 2 tablets (200 mg total) by mouth at bedtime.   traZODone (DESYREL) 50 MG tablet Take 1 tablet (50 mg total) by mouth at bedtime.   vitamin B-12 (CYANOCOBALAMIN) 1000 MCG tablet Take 1,000 mcg by mouth daily.   zolpidem (AMBIEN) 5 MG tablet TAKE 1 TABLET(5 MG) BY MOUTH AT BEDTIME AS NEEDED FOR SLEEP   MUCINEX 600 MG 12 hr tablet TAKE 2 TABLETS BY MOUTH TWICE DAILY (Patient not taking: Reported on 12/30/2022)   ondansetron (ZOFRAN) 4 MG tablet Take 1 tablet (4 mg total) by mouth every 8 (eight) hours as needed for nausea or vomiting. (Patient not taking: Reported on 12/30/2022)   oxyCODONE (OXY IR/ROXICODONE) 5 MG immediate release tablet Take 1 tablet (5 mg total) by mouth every 6 (six) hours as needed for severe pain. (Patient not taking: Reported on 12/30/2022)   Sodium Chloride Flush (NORMAL SALINE FLUSH) 0.9 % SOLN Flush drain with 5 mLs daily. Discard remaining 5ml in each syringe. 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 M75.00)   Allergic rhinitis due to pollen 09/03/2013   J30.1)  Formatting of this note might be different from the original. J30.1)   Allergy    Anticoagulated 07/01/2016   Anxiety    Arthritis    Atrial fibrillation (HCC)    Atrial flutter (HCC) 06/26/2015   CAD (coronary artery disease)    Chest pain 06/26/2015   COPD (chronic obstructive pulmonary disease) (HCC)    Coronary artery disease 07/06/2018   Cardiac catheterization 2017 showing 90% small diagonal branch disease   DDD (degenerative disc disease), cervical 09/03/2013   M50.90)  Formatting of this note might be different from the original. M50.90)   Depression    Dizziness 10/28/2017   Dyslipidemia, goal LDL below 70 07/06/2018   Dyspnea on exertion 10/20/2018   Dysrhythmia    Essential hypertension 07/06/2018   ETOH abuse 01/11/2019   6 pack of beer per day   Falls 10/28/2017   GERD (gastroesophageal reflux disease)  H/O amiodarone therapy 07/01/2016   Heart palpitations 01/27/2017   History of colon polyps    History of kidney stones    Hyperlipidemia    Hypertension    IPF (idiopathic pulmonary fibrosis) (HCC)    Neck pain 09/03/2013   Neuropathy 07/06/2018   Obstructive sleep apnea 08/05/2015   PLMD (periodic limb movement disorder) 11/04/2015   Polycythemia, 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 STORY: Due to alcohol/ tobacco   Pure hypercholesterolemia 09/03/2013   E78.0)  Formatting of this note might be different from the original. 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/06/2018   2017   Past Surgical History:  Procedure Laterality Date   ATRIAL FIBRILLATION ABLATION  10/2015   BACK SURGERY     CARDIOVERSION  2017   CATARACT EXTRACTION Bilateral 2017   March and April 2017   CHOLECYSTECTOMY N/A 12/21/2022   Procedure: LAPAROSCOPIC CHOLECYSTECTOMY AND  REMOVAL OF BILIARY DRAIN;  Surgeon: Rodman Pickle, MD;  Location: [facility];  Service: General;  Laterality: N/A;   COLONOSCOPY  2018   CORONARY STENT PLACEMENT  2012   CORONARY/GRAFT ACUTE MI REVASCULARIZATION N/A 01/10/2019   Procedure: CORONARY/GRAFT ACUTE MI REVASCULARIZATION;  Surgeon: Marykay Lex, MD;  Location: [facility] INVASIVE CV LAB;  Service: Cardiovascular;  Laterality: N/A;   INGUINAL HERNIA REPAIR     over 20 years ago   IR CHOLANGIOGRAM EXISTING TUBE  09/16/2022   IR EXCHANGE BILIARY DRAIN  10/06/2022   IR PERC CHOLECYSTOSTOMY  08/30/2022   LAPAROSCOPIC APPENDECTOMY N/A 01/17/2021   Procedure: APPENDECTOMY LAPAROSCOPIC;  Surgeon: Quentin Ore, MD;  Location: [facility] OR;  Service: General;  Laterality: N/A;   LEFT HEART CATH AND CORONARY ANGIOGRAPHY N/A 01/10/2019   Procedure: LEFT HEART CATH AND CORONARY ANGIOGRAPHY;  Surgeon: Marykay Lex, MD;  Location: [facility] INVASIVE CV LAB;  Service: Cardiovascular;  Laterality: N/A;   LUMBAR LAMINECTOMY Bilateral 04/05/2016   L2-L5    VENTRAL HERNIA REPAIR  2018   Family History  Problem Relation Age of Onset   Anxiety disorder Mother    Alcohol abuse Father    Colon cancer Father    Depression Brother    Alcohol abuse Brother    Throat cancer Brother    Social History   Socioeconomic History   Marital status: Married    Spouse name: Not on file   Number of children: Not on file   Years of education: Not on file   Highest education level: Not on file  Occupational History   Not on file  Tobacco Use   Smoking status: Former    Current packs/day: 0.00    Average packs/day: 1 pack/day for 30.0 years (30.0 ttl pk-yrs)    Types: Cigarettes    Start date: 01/09/1989    Quit date: 01/10/2019    Years since quitting: 3.9   Smokeless tobacco: Former  Building services engineer status: Never Used  Substance and Sexual Activity   Alcohol use: Not Currently    Comment: 8oz of wine a day   Drug use: Never   Sexual activity:  Not on file  Other Topics Concern   Not on file  Social History Narrative   Not on file   Social Determinants of Health   Financial Resource Strain: Low Risk  (12/30/2022)   Overall Financial Resource Strain (CARDIA)  Difficulty of Paying Living Expenses: Not hard at all  Food Insecurity: No Food Insecurity (12/30/2022)   Hunger Vital Sign    Worried About Running Out of Food in the Last Year: Never true    Ran Out of Food in the Last Year: Never true  Transportation Needs: No Transportation Needs (12/30/2022)   PRAPARE - Administrator, Civil Service (Medical): No    Lack of Transportation (Non-Medical): No  Physical Activity: Insufficiently Active (12/30/2022)   Exercise Vital Sign    Days of Exercise per Week: 3 days    Minutes of Exercise per Session: 30 min  Stress: No Stress Concern Present (12/30/2022)   [employer] of Occupational Health - Occupational Stress Questionnaire    Feeling of Stress : Not at all  Social Connections: Socially Integrated (12/30/2022)   Social Connection and Isolation Panel [NHANES]    Frequency of Communication with Friends and Family: More than three times a week    Frequency of Social Gatherings with Friends and Family: More than three times a week    Attends Religious Services: More than 4 times per year    Active Member of [employer] or Organizations: Yes    Attends Banker Meetings: Never    Marital Status: Married    Tobacco Counseling Counseling given: Not Answered   Clinical Intake:  Pre-visit preparation completed: Yes  Pain : No/denies pain Pain Score: 0-No pain     Nutritional Status: BMI 25 -29 Overweight Nutritional Risks: None Diabetes: No  How often do you need to have someone help you when you read instructions, pamphlets, or other written materials from your doctor or pharmacy?: 1 - Never  Interpreter Needed?: No  Information entered by :: Kennedy Bucker, LPN   Activities of Daily  Living    12/30/2022    1:40 PM 12/15/2022   11:21 AM  In your present state of health, do you have any difficulty performing the following activities:  Hearing? 1   Vision? 0   Difficulty concentrating or making decisions? 0   Walking or climbing stairs? 1   Comment balance   Dressing or bathing? 0   Doing errands, shopping? 0 0  Preparing Food and eating ? N   Using the Toilet? N   In the past six months, have you accidently leaked urine? N   Do you have problems with loss of bowel control? N   Managing your Medications? Y   Managing your Finances? N   Housekeeping or managing your Housekeeping? N     Patient Care Team: [name], [name] as PCP - General (Internal Medicine) Georgeanna Lea, MD as PCP - Cardiology (Cardiology) Colletta [place], RN as [facility] Care Management  Indicate any recent Medical Services you may have received from other than [facility] providers in the past year (date may be approximate).     Assessment:   This is a routine wellness examination for Megan.  Hearing/Vision screen Hearing Screening - Comments:: No aids Vision Screening - Comments:: Wears glasses-    Goals Addressed             This Visit's Progress    DIET - EAT MORE FRUITS AND VEGETABLES         Depression Screen    12/30/2022    1:34 PM 07/09/2022    1:18 PM 12/22/2021    1:42 PM 11/25/2020    9:47 AM 10/13/2020    9:18 AM  PHQ 2/9  Scores  PHQ - 2 Score 1 0 0 1 6  PHQ- 9 Score 2    17    Fall Risk    12/30/2022    1:39 PM 07/09/2022    1:18 PM 12/22/2021    1:45 PM 11/25/2020    9:46 AM 10/13/2020    8:17 AM  Fall Risk   Falls in the past year? 1 0 1 1 1   Number falls in past yr: 1 0 1 1 1   Injury with Fall? 0 0 0 0 0  Risk for fall due to : History of fall(s);Impaired balance/gait Impaired balance/gait Impaired vision;Impaired mobility;Impaired balance/gait;History of fall(s) History of fall(s);Impaired mobility   Follow up Falls prevention  discussed;Falls evaluation completed  Falls prevention discussed Falls prevention discussed     MEDICARE RISK AT HOME: Medicare Risk at Home Any stairs in or around the home?: Yes If so, are there any without handrails?: No Home free of loose throw rugs in walkways, pet beds, electrical cords, etc?: No Adequate lighting in your home to reduce risk of falls?: Yes Life alert?: No Use of a cane, walker or w/c?:  (cane and walker and electric w/c) Grab bars in the bathroom?: Yes Shower chair or bench in shower?: Yes Elevated toilet seat or a handicapped toilet?: Yes  TIMED UP AND GO:  Was the test performed?  No    Cognitive Function:      06/15/2022    1:14 PM  Montreal Cognitive Assessment   Visuospatial/ Executive (0/5) 3  Naming (0/3) 3  Attention: Read list of digits (0/2) 2  Attention: Read list of letters (0/1) 1  Attention: Serial 7 subtraction starting at 100 (0/3) 1  Language: Repeat phrase (0/2) 1  Language : Fluency (0/1) 1  Abstraction (0/2) 1  Delayed Recall (0/5) 0  Orientation (0/6) 6  Total 19      12/30/2022    1:42 PM 12/22/2021    1:47 PM 11/25/2020    9:57 AM  6CIT Screen  What Year? 0 points 0 points 0 points  What month? 0 points 0 points 0 points  What time? 0 points 0 points 0 points  Count back from 20 0 points 0 points 0 points  Months in reverse 0 points 0 points 2 points  Repeat phrase 2 points 0 points 2 points  Total Score 2 points 0 points 4 points    Immunizations Immunization History  Administered Date(s) Administered   Influenza Split 01/09/2010, 02/09/2011   Influenza, High Dose Seasonal PF 11/15/2018, 01/11/2020, 12/27/2020, 12/24/2021   Influenza,inj,Quad PF,6+ Mos 01/01/2015   Influenza-Unspecified 01/30/2014, 02/02/2016, 12/15/2020   PFIZER(Purple Top)SARS-COV-2 Vaccination 04/27/2019, 05/18/2019, 01/14/2020, 07/22/2020, 12/15/2020, 12/24/2021   Pneumococcal Conjugate-13 11/23/2018   Pneumococcal Polysaccharide-23 08/12/2020    Respiratory Syncytial Virus Vaccine,Recomb Aduvanted(Arexvy) 12/17/2021   Tdap 02/18/2011    TDAP status: Due, Education has been provided regarding the importance of this vaccine. Advised may receive this vaccine at local pharmacy or Health Dept. Aware to provide a copy of the vaccination record if obtained from local pharmacy or Health Dept. Verbalized acceptance and understanding.  Flu Vaccine status: Up to date  Pneumococcal vaccine status: Up to date  Covid-19 vaccine status: Completed vaccines  Qualifies for Shingles Vaccine? Yes   Zostavax completed No   Shingrix Completed?: No.    Education has been provided regarding the importance of this vaccine. Patient has been advised to call insurance company to determine out of pocket expense if they have not  yet received this vaccine. Advised may also receive vaccine at local pharmacy or Health Dept. Verbalized acceptance and understanding.  Screening Tests Health Maintenance  Topic Date Due   Zoster Vaccines- Shingrix (1 of 2) Never done   DTaP/Tdap/Td (2 - Td or Tdap) 02/17/2021   INFLUENZA VACCINE  10/21/2022   COVID-19 Vaccine (7 - 2023-24 season) 11/21/2022   Lung Cancer Screening  09/16/2023   Medicare Annual Wellness (AWV)  12/30/2023   Colonoscopy  07/10/2031   Pneumonia Vaccine 49+ Years old  Completed   Hepatitis C Screening  Completed   HPV VACCINES  Aged Out    Health Maintenance  Health Maintenance Due  Topic Date Due   Zoster Vaccines- Shingrix (1 of 2) Never done   DTaP/Tdap/Td (2 - Td or Tdap) 02/17/2021   INFLUENZA VACCINE  10/21/2022   COVID-19 Vaccine (7 - 2023-24 season) 11/21/2022    Colorectal cancer screening: Type of screening: Colonoscopy. Completed 07/09/21. Repeat every 10 years  Lung Cancer Screening: (Low Dose CT Chest recommended if Age 34-80 years, 20 pack-year currently smoking OR have quit w/in 15years.) does qualify.   Lung Cancer Screening Referral: CT done 09/16/22  Additional  Screening:  Hepatitis C Screening: does qualify; Completed 12/30/21  Vision Screening: Recommended annual ophthalmology exams for early detection of glaucoma and other disorders of the eye. Is the patient up to date with their annual eye exam?  Yes  Who is the provider or what is the name of the office in which the patient attends annual eye exams? Can't remember name If pt is not established with a provider, would they like to be referred to a provider to establish care? No .   Dental Screening: Recommended annual dental exams for proper oral hygiene   Community Resource Referral / Chronic Care Management: CRR required this visit?  No   CCM required this visit?  No     Plan:     I have personally reviewed and noted the following in the patient's chart:   Medical and social history Use of alcohol, tobacco or illicit drugs  Current medications and supplements including opioid prescriptions. Patient is not currently taking opioid prescriptions. Functional ability and status Nutritional status Physical activity Advanced directives List of other physicians Hospitalizations, surgeries, and ER visits in previous 12 months Vitals Screenings to include cognitive, depression, and falls Referrals and appointments  In addition, I have reviewed and discussed with patient certain preventive protocols, quality metrics, and best practice recommendations. A written personalized care plan for preventive services as well as general preventive health recommendations were provided to patient.     Hal Hope, LPN   [id number]   After Visit Summary: (MyChart) Due to this being a telephonic visit, the after visit summary with patients personalized plan was offered to patient via MyChart   Nurse Notes: none

## 2022-12-30 NOTE — Patient Instructions (Addendum)
Robert Lang , Thank you for taking time to come for your Medicare Wellness Visit. I appreciate your ongoing commitment to your health goals. Please review the following plan we discussed and let me know if I can assist you in the future.   Referrals/Orders/Follow-Ups/Clinician Recommendations: none  This is a list of the screening recommended for you and due dates:  Health Maintenance  Topic Date Due   Zoster (Shingles) Vaccine (1 of 2) Never done   DTaP/Tdap/Td vaccine (2 - Td or Tdap) 02/17/2021   Flu Shot  10/21/2022   COVID-19 Vaccine (7 - 2023-24 season) 11/21/2022   Screening for Lung Cancer  09/16/2023   Medicare Annual Wellness Visit  12/30/2023   Colon Cancer Screening  07/10/2031   Pneumonia Vaccine  Completed   Hepatitis C Screening  Completed   HPV Vaccine  Aged Out    Advanced directives: (In Chart) A copy of your advanced directives are scanned into your chart should your provider ever need it.  Next Medicare Annual Wellness Visit scheduled for next year: Yes   01/03/24 @ 2:40 pm by video

## 2023-01-05 DIAGNOSIS — I10 Essential (primary) hypertension: Secondary | ICD-10-CM | POA: Diagnosis not present

## 2023-01-05 DIAGNOSIS — J9601 Acute respiratory failure with hypoxia: Secondary | ICD-10-CM | POA: Diagnosis not present

## 2023-01-05 DIAGNOSIS — I452 Bifascicular block: Secondary | ICD-10-CM | POA: Diagnosis not present

## 2023-01-05 DIAGNOSIS — E7800 Pure hypercholesterolemia, unspecified: Secondary | ICD-10-CM | POA: Diagnosis not present

## 2023-01-05 DIAGNOSIS — F1010 Alcohol abuse, uncomplicated: Secondary | ICD-10-CM | POA: Diagnosis not present

## 2023-01-05 DIAGNOSIS — I2693 Single subsegmental pulmonary embolism without acute cor pulmonale: Secondary | ICD-10-CM | POA: Diagnosis not present

## 2023-01-05 DIAGNOSIS — J432 Centrilobular emphysema: Secondary | ICD-10-CM | POA: Diagnosis not present

## 2023-01-05 DIAGNOSIS — G4733 Obstructive sleep apnea (adult) (pediatric): Secondary | ICD-10-CM | POA: Diagnosis not present

## 2023-01-05 DIAGNOSIS — I4892 Unspecified atrial flutter: Secondary | ICD-10-CM | POA: Diagnosis not present

## 2023-01-05 DIAGNOSIS — I330 Acute and subacute infective endocarditis: Secondary | ICD-10-CM | POA: Diagnosis not present

## 2023-01-05 DIAGNOSIS — I2510 Atherosclerotic heart disease of native coronary artery without angina pectoris: Secondary | ICD-10-CM | POA: Diagnosis not present

## 2023-01-05 DIAGNOSIS — I252 Old myocardial infarction: Secondary | ICD-10-CM | POA: Diagnosis not present

## 2023-01-05 DIAGNOSIS — F411 Generalized anxiety disorder: Secondary | ICD-10-CM | POA: Diagnosis not present

## 2023-01-05 DIAGNOSIS — J690 Pneumonitis due to inhalation of food and vomit: Secondary | ICD-10-CM | POA: Diagnosis not present

## 2023-01-05 DIAGNOSIS — J84112 Idiopathic pulmonary fibrosis: Secondary | ICD-10-CM | POA: Diagnosis not present

## 2023-01-05 DIAGNOSIS — G629 Polyneuropathy, unspecified: Secondary | ICD-10-CM | POA: Diagnosis not present

## 2023-01-07 DIAGNOSIS — I10 Essential (primary) hypertension: Secondary | ICD-10-CM | POA: Diagnosis not present

## 2023-01-07 DIAGNOSIS — Z86711 Personal history of pulmonary embolism: Secondary | ICD-10-CM | POA: Diagnosis not present

## 2023-01-07 DIAGNOSIS — I2693 Single subsegmental pulmonary embolism without acute cor pulmonale: Secondary | ICD-10-CM | POA: Diagnosis not present

## 2023-01-07 DIAGNOSIS — A419 Sepsis, unspecified organism: Secondary | ICD-10-CM | POA: Diagnosis not present

## 2023-01-07 NOTE — Telephone Encounter (Signed)
Encounter opened in error, please disregard

## 2023-01-08 DIAGNOSIS — G629 Polyneuropathy, unspecified: Secondary | ICD-10-CM | POA: Diagnosis not present

## 2023-01-08 DIAGNOSIS — I2510 Atherosclerotic heart disease of native coronary artery without angina pectoris: Secondary | ICD-10-CM | POA: Diagnosis not present

## 2023-01-08 DIAGNOSIS — J690 Pneumonitis due to inhalation of food and vomit: Secondary | ICD-10-CM | POA: Diagnosis not present

## 2023-01-08 DIAGNOSIS — J432 Centrilobular emphysema: Secondary | ICD-10-CM | POA: Diagnosis not present

## 2023-01-08 DIAGNOSIS — I2693 Single subsegmental pulmonary embolism without acute cor pulmonale: Secondary | ICD-10-CM | POA: Diagnosis not present

## 2023-01-08 DIAGNOSIS — E7800 Pure hypercholesterolemia, unspecified: Secondary | ICD-10-CM | POA: Diagnosis not present

## 2023-01-08 DIAGNOSIS — I452 Bifascicular block: Secondary | ICD-10-CM | POA: Diagnosis not present

## 2023-01-08 DIAGNOSIS — F411 Generalized anxiety disorder: Secondary | ICD-10-CM | POA: Diagnosis not present

## 2023-01-08 DIAGNOSIS — I10 Essential (primary) hypertension: Secondary | ICD-10-CM | POA: Diagnosis not present

## 2023-01-08 DIAGNOSIS — I330 Acute and subacute infective endocarditis: Secondary | ICD-10-CM | POA: Diagnosis not present

## 2023-01-08 DIAGNOSIS — F1010 Alcohol abuse, uncomplicated: Secondary | ICD-10-CM | POA: Diagnosis not present

## 2023-01-08 DIAGNOSIS — I4892 Unspecified atrial flutter: Secondary | ICD-10-CM | POA: Diagnosis not present

## 2023-01-08 DIAGNOSIS — J84112 Idiopathic pulmonary fibrosis: Secondary | ICD-10-CM | POA: Diagnosis not present

## 2023-01-08 DIAGNOSIS — J9601 Acute respiratory failure with hypoxia: Secondary | ICD-10-CM | POA: Diagnosis not present

## 2023-01-08 DIAGNOSIS — G4733 Obstructive sleep apnea (adult) (pediatric): Secondary | ICD-10-CM | POA: Diagnosis not present

## 2023-01-08 DIAGNOSIS — I252 Old myocardial infarction: Secondary | ICD-10-CM | POA: Diagnosis not present

## 2023-01-09 DIAGNOSIS — I2693 Single subsegmental pulmonary embolism without acute cor pulmonale: Secondary | ICD-10-CM | POA: Diagnosis not present

## 2023-01-09 DIAGNOSIS — I10 Essential (primary) hypertension: Secondary | ICD-10-CM | POA: Diagnosis not present

## 2023-01-09 DIAGNOSIS — Z86711 Personal history of pulmonary embolism: Secondary | ICD-10-CM | POA: Diagnosis not present

## 2023-01-09 DIAGNOSIS — A419 Sepsis, unspecified organism: Secondary | ICD-10-CM | POA: Diagnosis not present

## 2023-01-12 ENCOUNTER — Ambulatory Visit: Payer: Medicare Other | Admitting: Cardiology

## 2023-01-13 ENCOUNTER — Other Ambulatory Visit: Payer: Self-pay | Admitting: Cardiology

## 2023-01-17 ENCOUNTER — Encounter: Payer: Self-pay | Admitting: Cardiology

## 2023-01-17 MED ORDER — RANOLAZINE ER 1000 MG PO TB12: 1000.0000 mg | ORAL_TABLET | Freq: Two times a day (BID) | ORAL | 2 refills | Status: DC

## 2023-01-20 ENCOUNTER — Telehealth: Payer: Self-pay | Admitting: Pharmacist

## 2023-01-20 NOTE — Telephone Encounter (Signed)
Received fax from [facility]. Patient has been APPROVED to continue receiving ESBRIET free of charge in 2025. There are no additional action steps needed from patient or provider at this time  Chesley Mires, PharmD, MPH, BCPS, CPP Clinical Pharmacist (Rheumatology and Pulmonology)

## 2023-01-28 ENCOUNTER — Other Ambulatory Visit: Payer: Self-pay | Admitting: Cardiology

## 2023-02-02 DIAGNOSIS — R351 Nocturia: Secondary | ICD-10-CM | POA: Diagnosis not present

## 2023-02-02 DIAGNOSIS — R3912 Poor urinary stream: Secondary | ICD-10-CM | POA: Diagnosis not present

## 2023-02-02 DIAGNOSIS — N401 Enlarged prostate with lower urinary tract symptoms: Secondary | ICD-10-CM | POA: Diagnosis not present

## 2023-02-02 DIAGNOSIS — R3914 Feeling of incomplete bladder emptying: Secondary | ICD-10-CM | POA: Diagnosis not present

## 2023-02-07 DIAGNOSIS — I10 Essential (primary) hypertension: Secondary | ICD-10-CM | POA: Diagnosis not present

## 2023-02-07 DIAGNOSIS — A419 Sepsis, unspecified organism: Secondary | ICD-10-CM | POA: Diagnosis not present

## 2023-02-07 DIAGNOSIS — I2693 Single subsegmental pulmonary embolism without acute cor pulmonale: Secondary | ICD-10-CM | POA: Diagnosis not present

## 2023-02-07 DIAGNOSIS — Z86711 Personal history of pulmonary embolism: Secondary | ICD-10-CM | POA: Diagnosis not present

## 2023-02-09 DIAGNOSIS — Z86711 Personal history of pulmonary embolism: Secondary | ICD-10-CM | POA: Diagnosis not present

## 2023-02-09 DIAGNOSIS — I10 Essential (primary) hypertension: Secondary | ICD-10-CM | POA: Diagnosis not present

## 2023-02-09 DIAGNOSIS — I2693 Single subsegmental pulmonary embolism without acute cor pulmonale: Secondary | ICD-10-CM | POA: Diagnosis not present

## 2023-02-09 DIAGNOSIS — A419 Sepsis, unspecified organism: Secondary | ICD-10-CM | POA: Diagnosis not present

## 2023-02-14 ENCOUNTER — Other Ambulatory Visit: Payer: Self-pay | Admitting: Gastroenterology

## 2023-02-14 DIAGNOSIS — R1011 Right upper quadrant pain: Secondary | ICD-10-CM

## 2023-02-14 DIAGNOSIS — K2970 Gastritis, unspecified, without bleeding: Secondary | ICD-10-CM

## 2023-02-14 DIAGNOSIS — K219 Gastro-esophageal reflux disease without esophagitis: Secondary | ICD-10-CM

## 2023-02-21 ENCOUNTER — Other Ambulatory Visit: Payer: Self-pay | Admitting: Neurology

## 2023-02-21 NOTE — Telephone Encounter (Signed)
Requested Prescriptions   Pending Prescriptions Disp Refills   zolpidem (AMBIEN) 5 MG tablet [Pharmacy Med Name: ZOLPIDEM 5MG  TABLETS] 30 tablet     Sig: TAKE 1 TABLET(5 MG) BY MOUTH AT BEDTIME AS NEEDED FOR SLEEP   Last seen 08/27/22 Next appt 05/11/23  Dispenses   Dispensed Days Supply Quantity Provider Pharmacy  ZOLPIDEM 5MG  TABLETS 01/19/2023 30 30 each Windell Norfolk, MD [facility] DRUG STORE #...  ZOLPIDEM 5MG  TABLETS 12/16/2022 30 30 each Windell Norfolk, MD [facility] DRUG STORE #...  ZOLPIDEM 5MG  TABLETS 11/08/2022 30 30 each Windell Norfolk, MD [facility] DRUG STORE #...  ZOLPIDEM 5MG  TABLETS 08/28/2022 30 30 each Windell Norfolk, MD [facility] DRUG STORE #...  ZOLPIDEM 5MG  TABLETS 07/09/2022 30 60 each Waymon Budge, MD [facility] DRUG STORE #...  ZOLPIDEM 5MG  TABLETS 06/11/2022 30 30 each Waymon Budge, MD [facility] DRUG STORE #...  ZOLPIDEM 5MG  TABLETS 05/04/2022 30 60 each Waymon Budge, MD [facility] DRUG STORE #...  ZOLPIDEM 5MG  TABLETS 04/12/2022 30 30 each Windell Norfolk, MD [facility] DRUG STORE #...  ZOLPIDEM 5MG  TABLETS 03/08/2022 30 30 each Windell Norfolk, MD [facility] DRUG STORE #...      Routing to provider to fill

## 2023-03-01 ENCOUNTER — Ambulatory Visit (INDEPENDENT_AMBULATORY_CARE_PROVIDER_SITE_OTHER): Payer: Medicare Other | Admitting: Medical

## 2023-03-01 VITALS — BP 120/66 | HR 92 | Resp 18 | Ht 71.0 in | Wt 204.0 lb

## 2023-03-01 DIAGNOSIS — R739 Hyperglycemia, unspecified: Secondary | ICD-10-CM

## 2023-03-01 DIAGNOSIS — J432 Centrilobular emphysema: Secondary | ICD-10-CM

## 2023-03-01 DIAGNOSIS — J84112 Idiopathic pulmonary fibrosis: Secondary | ICD-10-CM | POA: Diagnosis not present

## 2023-03-01 DIAGNOSIS — F3289 Other specified depressive episodes: Secondary | ICD-10-CM

## 2023-03-01 DIAGNOSIS — I10 Essential (primary) hypertension: Secondary | ICD-10-CM

## 2023-03-01 DIAGNOSIS — F419 Anxiety disorder, unspecified: Secondary | ICD-10-CM | POA: Diagnosis not present

## 2023-03-01 DIAGNOSIS — I480 Paroxysmal atrial fibrillation: Secondary | ICD-10-CM

## 2023-03-01 LAB — COMPREHENSIVE METABOLIC PANEL
ALT: 21 U/L (ref 0–53)
AST: 14 U/L (ref 0–37)
Albumin: 3.9 g/dL (ref 3.5–5.2)
Alkaline Phosphatase: 88 U/L (ref 39–117)
BUN: 13 mg/dL (ref 6–23)
CO2: 29 meq/L (ref 19–32)
Calcium: 8.7 mg/dL (ref 8.4–10.5)
Chloride: 103 meq/L (ref 96–112)
Creatinine, Ser: 0.77 mg/dL (ref 0.40–1.50)
GFR: 89.18 mL/min (ref >60.00)
Glucose, Bld: 128 mg/dL — ABNORMAL HIGH (ref 70–99)
Potassium: 4.3 meq/L (ref 3.5–5.1)
Sodium: 138 meq/L (ref 135–145)
Total Bilirubin: 0.5 mg/dL (ref 0.2–1.2)
Total Protein: 6.7 g/dL (ref 6.0–8.3)

## 2023-03-01 LAB — HEMOGLOBIN A1C: Hgb A1c MFr Bld: 5.7 % (ref 4.6–6.5)

## 2023-03-01 MED ORDER — ONDANSETRON HCL 4 MG PO TABS: 4.0000 mg | ORAL_TABLET | Freq: Three times a day (TID) | ORAL | 0 refills | Status: DC | PRN

## 2023-03-01 MED ORDER — FAMOTIDINE 20 MG PO TABS: 20.0000 mg | ORAL_TABLET | Freq: Every day | ORAL | 3 refills | Status: DC

## 2023-03-01 NOTE — Patient Instructions (Signed)
IPF (idiopathic pulmonary fibrosis)and Centrilobular emphysema (HCC) -on continuous ) o2. Continue to follow up with pulmonlogist  Depression and Anxiety -improved recently. Continue buspar 15 mg three times daily and follow up with behavioral health as you are scheduled.   Hypertension, unspecified type and PAF (paroxysmal atrial fibrillation) (HCC) -bp good today and normal rate. On exam no atrial fibrillation. Continue current cardiac meds. -cmp today  Elevated blood sugar  -check A1c today. Advise low sugar diet.  Gerd-mild occasional. Request refill of famotadine. Refilled today. Also rx zofran for nausea.  Follow up 3 months or sooner if needed

## 2023-03-01 NOTE — Progress Notes (Signed)
Subjective:    Patient ID: Robert Lang, male    DOB: 1951/01/04, 72 y.o.   MRN: [id number]  HPI Pt in for follow up.  Last visit AVS below in June 2024.  "1. Cholecystitis Placed referral to general surgeon and care will be coordinaed with interventional radiologist. - Comp Met (CMET) - CBC w/Diff - Lipase - Ambulatory referral to General Surgery -asking staff to schedule home health to come out.   2. Centrilobular emphysema (HCC) and . IPF (idiopathic pulmonary fibrosis) (HCC) Continue anoro inhaler. Keep checking 02 sat daily. If trending downward less than 90% let us know.   3) Atrial fibrillation, unspecified type (HCC) Continue to follow up with Dr. Dewayne Shorter. Continue cardiac meds and eliquis. Since eliquis expensive ask you reach out to his Dr. Kirtland Bouchard office to see if they have samples.   4)Other depression with anxiety.   5) PE. Continue Eliquis. 5 mg twice a day."  Pt updates me that he did finally get his gallbladder removed.  He followed up with Surgeon after procedure. Surgeon note states "doing well after gallbladder removal. He has some nausea with fatty foods. He denies diarrhea."  Some occasional reflux and nausea. He request rx of famotadine and nausea med/zofran. Pt states mild nausea at times.  Pt has history of copd and IPF. Pt now has portable oxygen. Pt states on oxygen all the time. Pt follow up with Dr. Dewayne Shorter.  Htn and PAF history- bp well controlled and normal rate.  Anxiety and mood overall better with less gi issues and better breathing on o2. Pt on buspar 15 mg tid.        Review of Systems  Constitutional:  Negative for chills, fatigue and fever.  Respiratory:  Negative for cough, chest tightness, shortness of breath and wheezing.   Cardiovascular:  Negative for chest pain and palpitations.  Gastrointestinal:  Negative for abdominal pain.  Genitourinary:  Negative for dysuria, flank pain and genital sores.  Musculoskeletal:   Negative for back pain and neck pain.  Skin:  Negative for rash.  Neurological:  Negative for dizziness and numbness.  Hematological:  Negative for adenopathy. Does not bruise/bleed easily.  Psychiatric/Behavioral:  Negative for behavioral problems and hallucinations. The patient is not hyperactive.     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 M75.00)   Allergic rhinitis due to pollen 09/03/2013   J30.1)  Formatting of this note might be different from the original. J30.1)   Allergy    Anticoagulated 07/01/2016   Anxiety    Arthritis    Atrial fibrillation (HCC)    Atrial flutter (HCC) 06/26/2015   CAD (coronary artery disease)    Chest pain 06/26/2015   COPD (chronic obstructive pulmonary disease) (HCC)    Coronary artery disease 07/06/2018   Cardiac catheterization 2017 showing 90% small diagonal branch disease   DDD (degenerative disc disease), cervical 09/03/2013   M50.90)  Formatting of this note might be different from the original. M50.90)   Depression    Dizziness 10/28/2017   Dyslipidemia, goal LDL below 70 07/06/2018   Dyspnea on exertion 10/20/2018   Dysrhythmia    Essential hypertension 07/06/2018   ETOH abuse 01/11/2019   6 pack of beer per day   Falls 10/28/2017   GERD (gastroesophageal reflux disease)  H/O amiodarone therapy 07/01/2016   Heart palpitations 01/27/2017   History of colon polyps    History of kidney stones    Hyperlipidemia    Hypertension    IPF (idiopathic pulmonary fibrosis) (HCC)    Neck pain 09/03/2013   Neuropathy 07/06/2018   Obstructive sleep apnea 08/05/2015   PLMD (periodic limb movement disorder) 11/04/2015   Polycythemia, 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 STORY: Due to alcohol/ tobacco   Pure hypercholesterolemia 09/03/2013   E78.0)  Formatting of this note might be different from the original. 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/06/2018   2017     Social History   Socioeconomic History   Marital status: Married    Spouse name: Not on file   Number of children: Not on file   Years of education: Not on file   Highest education level: Not on file  Occupational History   Not on file  Tobacco Use   Smoking status: Former    Current packs/day: 0.00    Average packs/day: 1 pack/day for 30.0 years (30.0 ttl pk-yrs)    Types: Cigarettes    Start date: 01/09/1989    Quit date: 01/10/2019    Years since quitting: 4.1   Smokeless tobacco: Former  Building services engineer status: Never Used  Substance and Sexual Activity   Alcohol use: Not Currently    Comment: 8oz of wine a day   Drug use: Never   Sexual activity: Not on file  Other Topics Concern   Not on file  Social History Narrative   Not on file   Social Determinants of Health   Financial Resource Strain: Low Risk  (12/30/2022)   Overall Financial Resource Strain (CARDIA)    Difficulty of Paying Living Expenses: Not hard at all  Food Insecurity: No Food Insecurity (12/30/2022)   Hunger Vital Sign    Worried About Running Out of Food in the Last Year: Never true    Ran Out of Food in the Last Year: Never true  Transportation Needs: No Transportation Needs (12/30/2022)   PRAPARE - Administrator, Civil Service (Medical): No    Lack of Transportation (Non-Medical): No  Physical Activity: Insufficiently Active (12/30/2022)   Exercise Vital Sign    Days of Exercise per Week: 3 days    Minutes of Exercise per Session: 30 min  Stress: No Stress Concern Present (12/30/2022)   [employer] of Occupational Health - Occupational Stress Questionnaire    Feeling of Stress : Not at all  Social  Connections: Socially Integrated (12/30/2022)   Social Connection and Isolation Panel [NHANES]    Frequency of Communication with Friends and Family: More than three times a week    Frequency of Social Gatherings with Friends and Family: More than three times a week    Attends Religious Services: More than 4 times per year    Active Member of [employer] or Organizations: Yes    Attends Banker Meetings: Never    Marital Status: Married  Catering manager Violence: Not At Risk (12/30/2022)   Humiliation, Afraid, Rape, and Kick questionnaire    Fear of Current or Ex-Partner: No    Emotionally Abused: No    Physically Abused: No    Sexually Abused: No    Past Surgical History:  Procedure Laterality Date  ATRIAL FIBRILLATION ABLATION  10/2015   BACK SURGERY     CARDIOVERSION  2017   CATARACT EXTRACTION Bilateral 2017   March and April 2017   CHOLECYSTECTOMY N/A 12/21/2022   Procedure: LAPAROSCOPIC CHOLECYSTECTOMY AND REMOVAL OF BILIARY DRAIN;  Surgeon: Sheliah Hatch De Blanch, MD;  Location: [facility]S;  Service: General;  Laterality: N/A;   COLONOSCOPY  2018   CORONARY STENT PLACEMENT  2012   CORONARY/GRAFT ACUTE MI REVASCULARIZATION N/A 01/10/2019   Procedure: CORONARY/GRAFT ACUTE MI REVASCULARIZATION;  Surgeon: Marykay Lex, MD;  Location: [facility] INVASIVE CV LAB;  Service: Cardiovascular;  Laterality: N/A;   INGUINAL HERNIA REPAIR     over 20 years ago   IR CHOLANGIOGRAM EXISTING TUBE  09/16/2022   IR EXCHANGE BILIARY DRAIN  10/06/2022   IR PERC CHOLECYSTOSTOMY  08/30/2022   LAPAROSCOPIC APPENDECTOMY N/A 01/17/2021   Procedure: APPENDECTOMY LAPAROSCOPIC;  Surgeon: Quentin Ore, MD;  Location: [facility] OR;  Service: General;  Laterality: N/A;   LEFT HEART CATH AND CORONARY ANGIOGRAPHY N/A 01/10/2019   Procedure: LEFT HEART CATH AND CORONARY ANGIOGRAPHY;  Surgeon: Marykay Lex, MD;  Location: [facility] INVASIVE CV LAB;  Service: Cardiovascular;  Laterality: N/A;   LUMBAR LAMINECTOMY  Bilateral 04/05/2016   L2-L5    VENTRAL HERNIA REPAIR  2018    Family History  Problem Relation Age of Onset   Anxiety disorder Mother    Alcohol abuse Father    Colon cancer Father    Depression Brother    Alcohol abuse Brother    Throat cancer Brother     Allergies  Allergen Reactions   Naproxen Other (See Comments)    (Naprosyn *ANALGESICS - ANTI-INFLAMMATORY*) Nausea, Abdominal pain   Rapaflo [Silodosin] Other (See Comments)    Low blood pressure    Current Outpatient Medications on File Prior to Visit  Medication Sig Dispense Refill   acetaminophen (TYLENOL) 500 MG tablet Take 1 tablet (500 mg total) by mouth every 8 (eight) hours as needed for moderate pain. 100 tablet 0   apixaban (ELIQUIS) 5 MG TABS tablet Take 1 tablet (5 mg total) by mouth 2 (two) times daily. 60 tablet 0   atorvastatin (LIPITOR) 80 MG tablet TAKE 1 TABLET(80 MG) BY MOUTH DAILY 90 tablet 2   buPROPion (WELLBUTRIN XL) 300 MG 24 hr tablet Take 1 tablet (300 mg total) by mouth daily. 90 tablet 1   busPIRone (BUSPAR) 15 MG tablet Take 1 tablet (15 mg total) by mouth 3 (three) times daily. 90 tablet 3   Cholecalciferol (VITAMIN D3) 50 MCG (2000 UT) TABS Take 4,000 Units by mouth 2 (two) times daily.     docusate sodium (COLACE) 100 MG capsule Take 100 mg by mouth daily.     famotidine (PEPCID) 40 MG tablet TAKE 1 TABLET(40 MG) BY MOUTH AT BEDTIME 90 tablet 2   fexofenadine (ALLEGRA) 180 MG tablet Take 180 mg by mouth at bedtime.      finasteride (PROSCAR) 5 MG tablet Take 1 tablet (5 mg total) by mouth daily. (Patient taking differently: Take 5 mg by mouth at bedtime.) 90 tablet 1   lamoTRIgine (LAMICTAL) 100 MG tablet Take 1 tablet (100 mg total) by mouth daily. 90 tablet 1   levalbuterol (XOPENEX) 1.25 MG/0.5ML nebulizer solution Take 1.25 mg by nebulization every 4 (four) hours as needed for wheezing or shortness of breath. 1 each 12   LORazepam (ATIVAN) 0.5 MG tablet Take one tablet by mouth only for  severe anxiety or agitation (Patient taking  differently: Take 0.5 mg by mouth daily as needed for anxiety. Take one tablet by mouth only for severe anxiety or agitation. Has not taking medication yet.) 30 tablet 1   Melatonin 10 MG TABS Take 10 mg by mouth at bedtime.     MUCINEX 600 MG 12 hr tablet TAKE 2 TABLETS BY MOUTH TWICE DAILY (Patient not taking: Reported on 12/30/2022) 120 tablet 0   Multiple Vitamins-Minerals (PRESERVISION AREDS PO) Take 1 capsule by mouth in the morning and at bedtime.     nitroGLYCERIN (NITROSTAT) 0.4 MG SL tablet Place 1 tablet (0.4 mg total) under the tongue every 5 (five) minutes as needed for chest pain. 25 tablet 3   ondansetron (ZOFRAN) 4 MG tablet Take 1 tablet (4 mg total) by mouth every 8 (eight) hours as needed for nausea or vomiting. (Patient not taking: Reported on 12/30/2022) 20 tablet 0   oxyCODONE (OXY IR/ROXICODONE) 5 MG immediate release tablet Take 1 tablet (5 mg total) by mouth every 6 (six) hours as needed for severe pain. (Patient not taking: Reported on 12/30/2022) 15 tablet 0   pantoprazole (PROTONIX) 40 MG tablet Take 1 tablet (40 mg total) by mouth daily. Please call [phone] to schedule an office visit for more refills 90 tablet 1   Pirfenidone (ESBRIET) 801 MG TABS Take 1 tablet (801 mg total) by mouth 3 (three) times daily. 270 tablet 1   polyethylene glycol powder (GLYCOLAX/MIRALAX) 17 GM/SCOOP powder Take 17 g by mouth daily.     pregabalin (LYRICA) 150 MG capsule Take 1 capsule (150 mg total) by mouth 2 (two) times daily. 90 capsule 5   ranolazine (RANEXA) 1000 MG SR tablet Take 1 tablet (1,000 mg total) by mouth 2 (two) times daily. 180 tablet 2   sertraline (ZOLOFT) 100 MG tablet Take 2 tablets (200 mg total) by mouth at bedtime. 180 tablet 1   Sodium Chloride Flush (NORMAL SALINE FLUSH) 0.9 % SOLN Flush drain with 5 mLs daily. Discard remaining 5ml in each syringe. (Patient not taking: Reported on 12/30/2022) 200 mL 0   traZODone  (DESYREL) 50 MG tablet Take 1 tablet (50 mg total) by mouth at bedtime. 90 tablet 1   vitamin B-12 (CYANOCOBALAMIN) 1000 MCG tablet Take 1,000 mcg by mouth daily.     zolpidem (AMBIEN) 5 MG tablet TAKE 1 TABLET(5 MG) BY MOUTH AT BEDTIME AS NEEDED FOR SLEEP 30 tablet 1   Current Facility-Administered Medications on File Prior to Visit  Medication Dose Route Frequency Provider Last Rate Last Admin   regadenoson (LEXISCAN) injection SOLN 0.4 mg  0.4 mg Intravenous Once Tobb, Kardie, DO       technetium tetrofosmin (TC-MYOVIEW) injection 31.7 millicurie  31.7 millicurie Intravenous Once PRN Tobb, Kardie, DO        BP 120/66   Pulse 92   Resp 18   Ht 5\' 11"  (1.803 m)   Wt 204 lb (92.5 kg)   SpO2 95%   BMI 28.45 kg/m        Objective:   Physical Exam  General Mental Status- Alert. General Appearance- Not in acute distress.   Skin General: Color- Normal Color. Moisture- Normal Moisture.  Neck Carotid Arteries- Normal color. Moisture- Normal Moisture. No carotid bruits. No JVD.  Chest and Lung Exam Auscultation: Breath Sounds:-Normal.  Cardiovascular Auscultation:Rythm- Regular. Murmurs & Other Heart Sounds:Auscultation of the heart reveals- No Murmurs.  Abdomen Inspection:-Inspeection Normal. Palpation/Percussion:Note:No mass. Palpation and Percussion of the abdomen reveal- Non Tender, Non Distended + BS, no  rebound or guarding.   Neurologic Cranial Nerve exam:- CN III-XII intact(No nystagmus), symmetric smile. Strength:- 5/5 equal and symmetric strength both upper and lower extremities.   Lower ext- no pedal edema. Negative homans signs.    Assessment & Plan:   Patient Instructions  IPF (idiopathic pulmonary fibrosis)and Centrilobular emphysema (HCC) -on continuous ) o2. Continue to follow up with pulmonlogist  Depression and Anxiety -improved recently. Continue buspar 15 mg three times daily and follow up with behavioral health as you are  scheduled.   Hypertension, unspecified type and PAF (paroxysmal atrial fibrillation) (HCC) -bp good today and normal rate. On exam no atrial fibrillation. Continue current cardiac meds. -cmp today  Elevated blood sugar  -check A1c today. Advise low sugar diet.  Gerd-mild occasional. Request refill of famotadine. Refilled today. Also rx zofran for nausea.  Follow up 3 months or sooner if needed   [employer], [name]

## 2023-03-07 ENCOUNTER — Telehealth: Payer: Self-pay

## 2023-03-07 NOTE — Patient Instructions (Signed)
 Visit Information  Thank you for taking time to visit with me today. Please don't hesitate to contact me if I can be of assistance to you.   Following are the goals we discussed today:  Continue to take medications as prescribed. Continue to attend provider visits as scheduled Continue to eat healthy, lean meats, vegetables, fruits, avoid saturated and transfats Contact provider with health questions or concerns as needed  If you are experiencing a Mental Health or Behavioral Health Crisis or need someone to talk to, please call the Suicide and Crisis Lifeline: 988 call the [place] National Suicide Prevention Lifeline: 5730131948 or TTY: (917) 166-3313 TTY (845) 528-7345) to talk to a trained counselor  Kathyrn Sheriff, RN, MSN, BSN, CCM Care Management Coordinator [phone]

## 2023-03-07 NOTE — Patient Outreach (Signed)
  Care Coordination   Follow Up Visit Note   03/07/2023 Name: Robert Lang MRN: [id number] DOB: 09-09-1950  Robert Lang is a 72 y.o. year old male who sees [name], [name], [place] for primary care. I spoke with  Robert Lang by phone today.  What matters to the patients health and wellness today?  RNCM called to follow up to assess for care management needs. Robert Lang states, "I'm doing good". Lap cholecystectomy with drain removal completed on 12/21/22. Patient denies any concerns or issues. He states he has gone to church a couple of times with no problems. He continues to use Oxygen at 2L/[place] continuous and reports O2 sats-94-96%. Has followed up with providers as scheduled. He states he is eating well and denies any care management needs at this time. No care management needs identified. Patient to contact RNCM or primary care provider if care management needs in the future.  Goals Addressed             This Visit's Progress    COMPLETED: Continue to improve post hospitalization       Interventions Today    Flowsheet Row Most Recent Value  Chronic Disease   Chronic disease during today's visit Other, Chronic Obstructive Pulmonary Disease (COPD), Hypertension (HTN)  [cholecystitis, idiopathic pulmonary fibrosis]  General Interventions   General Interventions Discussed/Reviewed General Interventions Reviewed, Doctor Visits  [reviewed care managment services. encouraged patient to contact RNCM or Primary provider if care management services needed in the future.]  Doctor Visits Discussed/Reviewed PCP, Specialist  PCP/Specialist Visits Compliance with follow-up visit  [reviewed upcoming appointments,  including cardiology visit scheduled for tomorrow]  Education Interventions   Education Provided Provided Education  Provided Verbal Education On Other  [advised to take medications as prescriced, attend provider appointments as scheduled, contact provider wtih health  questions or concerns as needed.]  Nutrition Interventions   Nutrition Discussed/Reviewed Nutrition Discussed  Pharmacy Interventions   Pharmacy Dicussed/Reviewed Pharmacy Topics Discussed            SDOH assessments and interventions completed:  No  Care Coordination Interventions:  Yes, provided   Follow up plan: No further intervention required.   Encounter Outcome:  Patient Visit Completed   Robert Sheriff, RN, MSN, BSN, CCM Care Management Coordinator [phone]

## 2023-03-08 ENCOUNTER — Ambulatory Visit: Payer: Medicare Other | Admitting: Behavioral Health

## 2023-03-08 ENCOUNTER — Encounter: Payer: Self-pay | Admitting: Cardiology

## 2023-03-08 ENCOUNTER — Ambulatory Visit: Payer: Medicare Other | Attending: Cardiology | Admitting: Cardiology

## 2023-03-08 VITALS — BP 90/62 | HR 94 | Ht 71.0 in | Wt 204.0 lb

## 2023-03-08 DIAGNOSIS — J84112 Idiopathic pulmonary fibrosis: Secondary | ICD-10-CM | POA: Diagnosis not present

## 2023-03-08 DIAGNOSIS — Z9889 Other specified postprocedural states: Secondary | ICD-10-CM

## 2023-03-08 DIAGNOSIS — I2510 Atherosclerotic heart disease of native coronary artery without angina pectoris: Secondary | ICD-10-CM | POA: Diagnosis not present

## 2023-03-08 DIAGNOSIS — I10 Essential (primary) hypertension: Secondary | ICD-10-CM | POA: Diagnosis not present

## 2023-03-08 DIAGNOSIS — Z8679 Personal history of other diseases of the circulatory system: Secondary | ICD-10-CM

## 2023-03-08 DIAGNOSIS — J432 Centrilobular emphysema: Secondary | ICD-10-CM

## 2023-03-08 DIAGNOSIS — I483 Typical atrial flutter: Secondary | ICD-10-CM

## 2023-03-08 DIAGNOSIS — Z7901 Long term (current) use of anticoagulants: Secondary | ICD-10-CM

## 2023-03-08 NOTE — Patient Instructions (Addendum)
Medication Instructions:  Your physician recommends that you continue on your current medications as directed. Please refer to the Current Medication list given to you today.  *If you need a refill on your cardiac medications before your next appointment, please call your pharmacy*   Lab Work: 3rd Floor   [address]  Your physician recommends that you return for lab work in:  when fasting You need to have labs done when you are fasting.  You can come Monday through Friday 8:00 am to 11:30AM and 1:00 to 4:00. You do not need to make an appointment as the order has already been placed.     Testing/Procedures: None Ordered   Follow-Up: At [facility], you and your health needs are our priority.  As part of our continuing mission to provide you with exceptional heart care, we have created designated Provider Care Teams.  These Care Teams include your primary Cardiologist (physician) and Advanced Practice Providers (APPs -  Physician Assistants and Nurse Practitioners) who all work together to provide you with the care you need, when you need it.  We recommend signing up for the patient portal called "MyChart".  Sign up information is provided on this After Visit Summary.  MyChart is used to connect with patients for Virtual Visits (Telemedicine).  Patients are able to view lab/test results, encounter notes, upcoming appointments, etc.  Non-urgent messages can be sent to your provider as well.   To learn more about what you can do with MyChart, go to [email or web address].    Your next appointment:   6 month(s)  The format for your next appointment:   In Person  Provider:   Gypsy Balsam, MD    Other Instructions NA

## 2023-03-08 NOTE — Progress Notes (Signed)
Cardiology Office Note:    Date:  03/08/2023   ID:  Robert Lang, DOB Dec 04, 1950, MRN [id number]  PCP:  [name], [name]  Cardiologist:  Gypsy Balsam, MD    Referring MD: [name], [place]   Chief Complaint  Patient presents with   Medication Refill    Ranlazine, famotidine 40, eliquis and atorvastatin    History of Present Illness:    Robert Lang is a 72 y.o. male complex past medical history history of coronary artery disease status post PTCA and stenting of LAD and RCA, atrial flutter status post ablation 2017, essential hypertension, dyslipidemia, last cardiac catheterization done at [facility] in 2017 showing proximal LAD stent being patent to a 90% occlusion of a small first diagonal branch, 25% RCA, ejection fraction was normal at that time, last intervention done in 2020 with PTCA and stenting of the right coronary artery.  Also history of pulmonary emboli on anticoagulation History of cholecystitis did require cholecystectomy done few months ago.  Comes today after surgery he did have a drain in his gallbladder before and afterwards now drain is removed he is doing much better he feels better still described to have some shortness of breath which is related to idiopathic pulmonary fibrosis that she also has.  Denies have any cardiac complaints, there is no chest pain tightness squeezing pressure burning chest.  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 M75.00)   Allergic rhinitis due to pollen 09/03/2013   J30.1)  Formatting of this note might be different from the original. J30.1)   Allergy    Anticoagulated 07/01/2016   Anxiety    Arthritis    Atrial fibrillation (HCC)    Atrial flutter (HCC) 06/26/2015   CAD (coronary artery disease)    Chest pain  06/26/2015   COPD (chronic obstructive pulmonary disease) (HCC)    Coronary artery disease 07/06/2018   Cardiac catheterization 2017 showing 90% small diagonal branch disease   DDD (degenerative disc disease), cervical 09/03/2013   M50.90)  Formatting of this note might be different from the original. M50.90)   Depression    Dizziness 10/28/2017   Dyslipidemia, goal LDL below 70 07/06/2018   Dyspnea on exertion 10/20/2018   Dysrhythmia    Essential hypertension 07/06/2018   ETOH abuse 01/11/2019   6 pack of beer per day   Falls 10/28/2017   GERD (gastroesophageal reflux disease)    H/O amiodarone therapy 07/01/2016   Heart palpitations 01/27/2017   History of colon polyps    History of kidney stones    Hyperlipidemia    Hypertension    IPF (idiopathic pulmonary fibrosis) (HCC)    Neck pain 09/03/2013   Neuropathy 07/06/2018   Obstructive sleep apnea 08/05/2015   PLMD (periodic limb movement disorder) 11/04/2015   Polycythemia, 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 STORY: Due to alcohol/ tobacco   Pure hypercholesterolemia 09/03/2013   E78.0)  Formatting of this note might be different from the original. 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/06/2018   2017    Past Surgical History:  Procedure Laterality Date   ATRIAL FIBRILLATION ABLATION  10/2015   BACK SURGERY     CARDIOVERSION  2017   CATARACT EXTRACTION Bilateral 2017   March and April 2017   CHOLECYSTECTOMY N/A 12/21/2022   Procedure: LAPAROSCOPIC CHOLECYSTECTOMY AND REMOVAL OF BILIARY DRAIN;  Surgeon: Sheliah Hatch De Blanch, MD;  Location: [facility] ORS;  Service: General;  Laterality: N/A;   COLONOSCOPY  2018   CORONARY STENT PLACEMENT  2012   CORONARY/GRAFT ACUTE MI REVASCULARIZATION N/A 01/10/2019   Procedure: CORONARY/GRAFT ACUTE MI REVASCULARIZATION;  Surgeon: Marykay Lex, MD;   Location[facility] INVASIVE CV LAB;  Service: Cardiovascular;  Laterality: N/A;   INGUINAL HERNIA REPAIR     over 20 years ago   IR CHOLANGIOGRAM EXISTING TUBE  09/16/2022   IR EXCHANGE BILIARY DRAIN  10/06/2022   IR PERC CHOLECYSTOSTOMY  08/30/2022   LAPAROSCOPIC APPENDECTOMY N/A 01/17/2021   Procedure: APPENDECTOMY LAPAROSCOPIC;  Surgeon: Quentin Ore, MD;  Location: [facility] OR;  Service: General;  Laterality: N/A;   LEFT HEART CATH AND CORONARY ANGIOGRAPHY N/A 01/10/2019   Procedure: LEFT HEART CATH AND CORONARY ANGIOGRAPHY;  Surgeon: Marykay Lex, MD;  Location: [facility] INVASIVE CV LAB;  Service: Cardiovascular;  Laterality: N/A;   LUMBAR LAMINECTOMY Bilateral 04/05/2016   L2-L5    VENTRAL HERNIA REPAIR  2018    Current Medications: Current Meds  Medication Sig   acetaminophen (TYLENOL) 500 MG tablet Take 1 tablet (500 mg total) by mouth every 8 (eight) hours as needed for moderate pain.   apixaban (ELIQUIS) 5 MG TABS tablet Take 1 tablet (5 mg total) by mouth 2 (two) times daily.   atorvastatin (LIPITOR) 80 MG tablet TAKE 1 TABLET(80 MG) BY MOUTH DAILY (Patient taking differently: Take 80 mg by mouth daily.)   buPROPion (WELLBUTRIN XL) 300 MG 24 hr tablet Take 1 tablet (300 mg total) by mouth daily.   busPIRone (BUSPAR) 15 MG tablet Take 1 tablet (15 mg total) by mouth 3 (three) times daily.   Cholecalciferol (VITAMIN D3) 50 MCG (2000 UT) TABS Take 4,000 Units by mouth 2 (two) times daily.   docusate sodium (COLACE) 100 MG capsule Take 100 mg by mouth daily.   famotidine (PEPCID) 40 MG tablet TAKE 1 TABLET(40 MG) BY MOUTH AT BEDTIME (Patient taking differently: Take 40 mg by mouth at bedtime.)   fexofenadine (ALLEGRA) 180 MG tablet Take 180 mg by mouth at bedtime.    finasteride (PROSCAR) 5 MG tablet Take 1 tablet (5 mg total) by mouth daily. (Patient taking differently: Take 5 mg by mouth at bedtime.)   lamoTRIgine (LAMICTAL) 100 MG tablet Take 1 tablet (100 mg total) by mouth daily.    levalbuterol (XOPENEX) 1.25 MG/0.5ML nebulizer solution Take 1.25 mg by nebulization every 4 (four) hours as needed for wheezing or shortness of breath.   LORazepam (ATIVAN) 0.5 MG tablet Take one tablet by mouth only for severe anxiety or agitation (Patient taking differently: Take 0.5 mg by mouth daily as needed for anxiety. Take one tablet by mouth only for severe anxiety or agitation. Has not taking medication yet.)   Melatonin 10 MG TABS Take 10 mg by mouth at bedtime.   MUCINEX 600 MG 12 hr tablet TAKE 2 TABLETS BY MOUTH TWICE DAILY   Multiple Vitamins-Minerals (PRESERVISION AREDS PO) Take 1 capsule by mouth in the morning and at bedtime.   nitroGLYCERIN (NITROSTAT) 0.4 MG SL tablet Place 1 tablet (0.4 mg total) under the tongue every 5 (five) minutes as needed for chest pain.   ondansetron (ZOFRAN) 4 MG tablet Take 1 tablet (4 mg total) by mouth every 8 (eight)  hours as needed for nausea or vomiting.   ondansetron (ZOFRAN) 4 MG tablet Take 1 tablet (4 mg total) by mouth every 8 (eight) hours as needed for nausea or vomiting.   oxyCODONE (OXY IR/ROXICODONE) 5 MG immediate release tablet Take 1 tablet (5 mg total) by mouth every 6 (six) hours as needed for severe pain.   OXYGEN Inhale 2 L into the lungs daily.   pantoprazole (PROTONIX) 40 MG tablet Take 1 tablet (40 mg total) by mouth daily. Please call [phone] to schedule an office visit for more refills   Pirfenidone (ESBRIET) 801 MG TABS Take 1 tablet (801 mg total) by mouth 3 (three) times daily.   polyethylene glycol powder (GLYCOLAX/MIRALAX) 17 GM/SCOOP powder Take 17 g by mouth daily.   pregabalin (LYRICA) 150 MG capsule Take 1 capsule (150 mg total) by mouth 2 (two) times daily.   ranolazine (RANEXA) 1000 MG SR tablet Take 1 tablet (1,000 mg total) by mouth 2 (two) times daily.   sertraline (ZOLOFT) 100 MG tablet Take 2 tablets (200 mg total) by mouth at bedtime.   Sodium Chloride Flush (NORMAL SALINE FLUSH) 0.9 % SOLN Flush drain  with 5 mLs daily. Discard remaining 5ml in each syringe.   traZODone (DESYREL) 50 MG tablet Take 1 tablet (50 mg total) by mouth at bedtime.   vitamin B-12 (CYANOCOBALAMIN) 1000 MCG tablet Take 1,000 mcg by mouth daily.   zolpidem (AMBIEN) 5 MG tablet TAKE 1 TABLET(5 MG) BY MOUTH AT BEDTIME AS NEEDED FOR SLEEP (Patient taking differently: Take 5 mg by mouth at bedtime as needed for sleep.)   [DISCONTINUED] famotidine (PEPCID) 20 MG tablet Take 1 tablet (20 mg total) by mouth daily.     Allergies:   Naproxen and Rapaflo [silodosin]   Social History   Socioeconomic History   Marital status: Married    Spouse name: Not on file   Number of children: Not on file   Years of education: Not on file   Highest education level: Not on file  Occupational History   Not on file  Tobacco Use   Smoking status: Former    Current packs/day: 0.00    Average packs/day: 1 pack/day for 30.0 years (30.0 ttl pk-yrs)    Types: Cigarettes    Start date: 01/09/1989    Quit date: 01/10/2019    Years since quitting: 4.1   Smokeless tobacco: Former  Building services engineer status: Never Used  Substance and Sexual Activity   Alcohol use: Not Currently    Comment: 8oz of wine a day   Drug use: Never   Sexual activity: Not on file  Other Topics Concern   Not on file  Social History Narrative   Not on file   Social Drivers of Health   Financial Resource Strain: Low Risk  (12/30/2022)   Overall Financial Resource Strain (CARDIA)    Difficulty of Paying Living Expenses: Not hard at all  Food Insecurity: No Food Insecurity (12/30/2022)   Hunger Vital Sign    Worried About Running Out of Food in the Last Year: Never true    Ran Out of Food in the Last Year: Never true  Transportation Needs: No Transportation Needs (12/30/2022)   PRAPARE - Administrator, Civil Service (Medical): No    Lack of Transportation (Non-Medical): No  Physical Activity: Insufficiently Active (12/30/2022)   Exercise  Vital Sign    Days of Exercise per Week: 3 days    Minutes of Exercise  per Session: 30 min  Stress: No Stress Concern Present (12/30/2022)   [employer] of Occupational Health - Occupational Stress Questionnaire    Feeling of Stress : Not at all  Social Connections: Socially Integrated (12/30/2022)   Social Connection and Isolation Panel [NHANES]    Frequency of Communication with Friends and Family: More than three times a week    Frequency of Social Gatherings with Friends and Family: More than three times a week    Attends Religious Services: More than 4 times per year    Active Member of [employer] or Organizations: Yes    Attends Banker Meetings: Never    Marital Status: Married     Family History: The patient's family history includes Alcohol abuse in his brother and father; Anxiety disorder in his mother; Colon cancer in his father; Depression in his brother; Throat cancer in his brother. ROS:   Please see the history of present illness.    All 14 point review of systems negative except as described per history of present illness  EKGs/Labs/Other Studies Reviewed:         Recent Labs: 10/09/2022: B Natriuretic Peptide 330.7; Magnesium 2.0 11/25/2022: Hemoglobin 14.7; Platelets 267.0; Pro B Natriuretic peptide (BNP) 36.0 03/01/2023: ALT 21; BUN 13; Creatinine, Ser 0.77; Potassium 4.3; Sodium 138  Recent Lipid Panel    Component Value Date/Time   CHOL 144 12/30/2021 0944   CHOL 128 04/03/2020 0951   TRIG 342.0 (H) 12/30/2021 0944   HDL 25.00 (L) 12/30/2021 0944   HDL 40 04/03/2020 0951   CHOLHDL 6 12/30/2021 0944   VLDL 68.4 (H) 12/30/2021 0944   LDLCALC 58 04/03/2020 0951   LDLDIRECT 80.0 12/30/2021 0944    Physical Exam:    VS:  BP 90/62 (BP Location: Left Arm, Patient Position: Sitting)   Pulse 94   Ht 5\' 11"  (1.803 m)   Wt 204 lb (92.5 kg)   SpO2 95%   BMI 28.45 kg/m     Wt Readings from Last 3 Encounters:  03/08/23 204 lb (92.5 kg)   03/01/23 204 lb (92.5 kg)  12/21/22 150 lb (68 kg)     GEN:  Well nourished, well developed in no acute distress HEENT: Normal NECK: No JVD; No carotid bruits LYMPHATICS: No lymphadenopathy CARDIAC: RRR, no murmurs, no rubs, no gallops RESPIRATORY: Few crackles both bases ABDOMEN: Soft, non-tender, non-distended MUSCULOSKELETAL:  No edema; No deformity  SKIN: Warm and dry LOWER EXTREMITIES: no swelling NEUROLOGIC:  Alert and oriented x 3 PSYCHIATRIC:  Normal affect   ASSESSMENT:    1. Coronary artery disease involving native coronary artery of native heart without angina pectoris   2. Typical atrial flutter (HCC)   3. Essential hypertension   4. IPF (idiopathic pulmonary fibrosis) (HCC)   5. Centrilobular emphysema (HCC)   6. Anticoagulated   7. Status post ablation of atrial flutter    PLAN:    In order of problems listed above:  Coronary disease stable from that point.  Guideline directed medical therapy which I will continue.  No signs and symptoms of reactivation of the problem, stress test done recently showed no evidence of ischemia. History of typical atrial flutter and anticoagulated status post ablation the reason for anticoagulations also history of pulmonary emboli will continue. Essential hypertension blood pressure on the lower side continue monitoring. Idiopathic pulmonary fibrosis: Followed by internal medicine team. Dyslipidemia I did review K PN which show me data from 2023.  It is time to repeat his  cholesterol which we will do   Medication Adjustments/Labs and Tests Ordered: Current medicines are reviewed at length with the patient today.  Concerns regarding medicines are outlined above.  No orders of the defined types were placed in this encounter.  Medication changes: No orders of the defined types were placed in this encounter.   Signed, Georgeanna Lea, MD, [facility] 03/08/2023 10:33 AM    [facility]

## 2023-03-09 DIAGNOSIS — I10 Essential (primary) hypertension: Secondary | ICD-10-CM | POA: Diagnosis not present

## 2023-03-09 DIAGNOSIS — A419 Sepsis, unspecified organism: Secondary | ICD-10-CM | POA: Diagnosis not present

## 2023-03-09 DIAGNOSIS — Z86711 Personal history of pulmonary embolism: Secondary | ICD-10-CM | POA: Diagnosis not present

## 2023-03-09 DIAGNOSIS — I2693 Single subsegmental pulmonary embolism without acute cor pulmonale: Secondary | ICD-10-CM | POA: Diagnosis not present

## 2023-03-11 DIAGNOSIS — Z86711 Personal history of pulmonary embolism: Secondary | ICD-10-CM | POA: Diagnosis not present

## 2023-03-11 DIAGNOSIS — I2693 Single subsegmental pulmonary embolism without acute cor pulmonale: Secondary | ICD-10-CM | POA: Diagnosis not present

## 2023-03-11 DIAGNOSIS — I10 Essential (primary) hypertension: Secondary | ICD-10-CM | POA: Diagnosis not present

## 2023-03-11 DIAGNOSIS — A419 Sepsis, unspecified organism: Secondary | ICD-10-CM | POA: Diagnosis not present

## 2023-03-22 DIAGNOSIS — I2510 Atherosclerotic heart disease of native coronary artery without angina pectoris: Secondary | ICD-10-CM | POA: Diagnosis not present

## 2023-03-23 LAB — LIPID PANEL
Chol/HDL Ratio: 6.1 {ratio} — ABNORMAL HIGH (ref 0.0–5.0)
Cholesterol, Total: 158 mg/dL (ref 100–199)
HDL: 26 mg/dL — ABNORMAL LOW (ref >39)
LDL Chol Calc (NIH): 73 mg/dL (ref 0–99)
Triglycerides: 366 mg/dL — ABNORMAL HIGH (ref 0–149)
VLDL Cholesterol Cal: 59 mg/dL — ABNORMAL HIGH (ref 5–40)

## 2023-03-28 ENCOUNTER — Encounter: Payer: Self-pay | Admitting: Internal Medicine

## 2023-03-28 ENCOUNTER — Other Ambulatory Visit: Payer: Self-pay | Admitting: Pharmacist

## 2023-03-28 DIAGNOSIS — J84112 Idiopathic pulmonary fibrosis: Secondary | ICD-10-CM

## 2023-03-28 MED ORDER — PIRFENIDONE 801 MG PO TABS: 801.0000 mg | ORAL_TABLET | Freq: Three times a day (TID) | ORAL | 1 refills | Status: AC

## 2023-03-28 NOTE — Telephone Encounter (Signed)
 Refill for Esbriet sent to [facility].  CMET wnl on 03/01/2023   Last OV 11/25/2022

## 2023-03-29 ENCOUNTER — Telehealth: Payer: Self-pay

## 2023-03-29 DIAGNOSIS — E7800 Pure hypercholesterolemia, unspecified: Secondary | ICD-10-CM

## 2023-03-29 NOTE — Telephone Encounter (Signed)
 Left message on My Chart with lab results per Dr. Vanetta Shawl note. Routed to PCP.

## 2023-03-29 NOTE — Telephone Encounter (Signed)
 Pt viewed  Lab results on My Chart per Dr. Vanetta Shawl note. Routed to PCP.

## 2023-04-09 DIAGNOSIS — I10 Essential (primary) hypertension: Secondary | ICD-10-CM | POA: Diagnosis not present

## 2023-04-09 DIAGNOSIS — A419 Sepsis, unspecified organism: Secondary | ICD-10-CM | POA: Diagnosis not present

## 2023-04-09 DIAGNOSIS — Z86711 Personal history of pulmonary embolism: Secondary | ICD-10-CM | POA: Diagnosis not present

## 2023-04-09 DIAGNOSIS — I2693 Single subsegmental pulmonary embolism without acute cor pulmonale: Secondary | ICD-10-CM | POA: Diagnosis not present

## 2023-04-11 DIAGNOSIS — A419 Sepsis, unspecified organism: Secondary | ICD-10-CM | POA: Diagnosis not present

## 2023-04-11 DIAGNOSIS — I2693 Single subsegmental pulmonary embolism without acute cor pulmonale: Secondary | ICD-10-CM | POA: Diagnosis not present

## 2023-04-11 DIAGNOSIS — Z86711 Personal history of pulmonary embolism: Secondary | ICD-10-CM | POA: Diagnosis not present

## 2023-04-11 DIAGNOSIS — I10 Essential (primary) hypertension: Secondary | ICD-10-CM | POA: Diagnosis not present

## 2023-04-12 ENCOUNTER — Other Ambulatory Visit: Payer: Self-pay | Admitting: Cardiology

## 2023-04-12 DIAGNOSIS — H31011 Macula scars of posterior pole (postinflammatory) (post-traumatic), right eye: Secondary | ICD-10-CM | POA: Diagnosis not present

## 2023-04-18 ENCOUNTER — Telehealth: Payer: Self-pay | Admitting: Cardiology

## 2023-04-18 ENCOUNTER — Other Ambulatory Visit: Payer: Self-pay | Admitting: Neurology

## 2023-04-18 NOTE — Telephone Encounter (Signed)
Patient is calling to talk with Dr. Bing Matter or nurse in regards to [employer] to get Eliquis

## 2023-04-18 NOTE — Telephone Encounter (Signed)
Last seen 08/27/22, 05/11/23 Dispenses   Dispensed Days Supply Quantity Provider Pharmacy  PREGABALIN 150MG  CAPSULES 03/08/2023 45 90 each Windell Norfolk, MD [facility] DR[facility] STORE #...  PREGABALIN 150MG  CAPSULES 01/19/2023 45 90 each Windell Norfolk, MD [facility] DRUG STORE #...  PREGABALIN 150MG  CAPSULES 12/09/2022 45 90 each Windell Norfolk, MD [facility] DRUG STORE #...  PREGABALIN 150MG  CAPSULES 10/22/2022 45 90 each Windell Norfolk, MD [facility] DRUG STORE #...  PREGABALIN 150MG  CAPSULES 08/28/2022 45 90 each Windell Norfolk, MD [facility] DRUG STORE #...  PREGABALIN 150MG  CAPSULES 07/15/2022 45 90 each Windell Norfolk, MD [facility] DRUG STORE #.Marland KitchenMarland Kitchen

## 2023-04-19 ENCOUNTER — Ambulatory Visit: Payer: Medicare Other | Admitting: Behavioral Health

## 2023-04-19 NOTE — Telephone Encounter (Signed)
Spoke to [place], advised of the foundations new process. Verbalized understanding. I advise the patient it will be best to submit application sometime in March or when he meets OOP.

## 2023-04-20 ENCOUNTER — Telehealth: Payer: Self-pay | Admitting: Cardiology

## 2023-04-20 NOTE — Telephone Encounter (Signed)
Wife called stating she needs a diagnosis on why he needs to take Eliquis. She said she needs this information for the assistance program through his insurance.

## 2023-04-20 NOTE — Telephone Encounter (Signed)
Spoke with pts spouse regarding diagnosis and codes for pt assistance program

## 2023-04-21 ENCOUNTER — Other Ambulatory Visit: Payer: Self-pay | Admitting: Neurology

## 2023-04-22 NOTE — Progress Notes (Unsigned)
HPI M former smoker(30 pk yrs) followed for OSA, complicated by CAD/ MI, AFIB, HTN, Allergic Rhinitis, COPD, ILD, Nocturnal Hypoxemia,Lung Nodules (Dr Marchelle Gearing), Degenertive Disc Disease, ETOH, Hyperlipidemia, HST 06/10/20- AHI 31/ hr, saturation 80%-89%, body weight 194 lbs  ================================================================  04/22/22- 72 yoM former smoker(30 pk yrs) followed for OSA, complicated by CAD/ MI, AFIB, HTN, Allergic Rhinitis, COPD, ILD, Nocturnal Hypoxemia,Lung Nodules (Dr Marchelle Gearing), Degenerative Disc Disease, ETOH, Hyperlipidemia, CPAP auto 5-20/ [facility]             AirSense 11 AutoSet Download- compliance 0%, AHI 0.5/ hr    All short nights Body weight today- Covid vax- 4 Phizer Flu- had Wife here> He's up and down all night due to peripheral neuropathy. Neurologist treating- also started ambien 5 mg 2 months ago. We discussed alternatives to CPAP. He isn't interested in [place]. Discussed sleep medicines. He is going to try Voltaren oint on leg and occasionally increase ambien to 10 mg. I would consider Lunesta or clonazepam if needed- appropriate discussion done. Lying in bed watching YouTube in evening. Discussed sleep hygiene.  04/25/23- 73 yoM former smoker(30 pk yrs) followed for OSA, complicated by CAD/ MI, AFIB, HTN, Allergic Rhinitis, COPD, ILD, Nocturnal Hypoxemia,Lung Nodules (Dr Marchelle Gearing), Degenerative Disc Disease, ETOH, Hyperlipidemia, Peripheral Neuropathy, DVT/PE/ Eliquis,  CPAP auto 5-20/ [facility]             AirSense 11 AutoSet Download- compliance 90%, AHI 1.2 hr Body weight today-201 lbs Cholecystectomy complicated by DVT/PE last summer, now on Eliquis. Discussed the use of AI scribe software for clinical note transcription with the patient, who gave verbal consent to proceed.  History of Present Illness   The patient, with a history of sleep apnea and pulmonary fibrosis, presents with concerns about his CPAP machine and oxygen concentrator. He  reports that he had been experiencing difficulty with the CPAP machine, particularly at night, when he felt like he was "drowning" due to excessive moisture. This issue was resolved by adjusting the humidifier setting on the machine. The patient also reports that he has been using a portable oxygen concentrator, which he finds "aggravating." He has decided to monitor his oxygen levels and only use the concentrator when his oxygen levels drop- discussed. The patient also mentions a recent hospitalization due to a blood clot in the lung and a gallbladder surgery, which he describes as a difficult period last summer. Now on Eliquis..     CXR 10/15/22 IMPRESSION: 1. Cardiomegaly without failure. 2. No acute cardiopulmonary disease.  ROS-see HPI   + = positive Constitutional:    weight loss, night sweats, fevers, chills, fatigue, lassitude. HEENT:    headaches, difficulty swallowing, tooth/dental problems, sore throat,       sneezing, itching, ear ache, nasal congestion, post nasal drip, snoring CV:    chest pain, orthopnea, PND, swelling in lower extremities, anasarca,                                   dizziness, palpitations Resp:   +shortness of breath with exertion or at rest.                productive cough,   non-productive cough, coughing up of blood.              change in color of mucus.  wheezing.   Skin:    rash or lesions. GI:  No-   heartburn, indigestion, abdominal pain, nausea,  vomiting, diarrhea,                 change in bowel habits, loss of appetite GU: dysuria, change in color of urine, no urgency or frequency.   flank pain. MS:   joint pain, stiffness, decreased range of motion, back pain. Neuro-     + peripheral neuropathy Psych:  change in mood or affect.  depression or anxiety.   memory loss.  OBJ- Physical Exam    +overweight    Arrival POC 2L 95% General- Alert, Oriented, Affect-appropriate, Distress- none acute Skin- rash-none, lesions- none, excoriation-  none Lymphadenopathy- none Head- atraumatic            Eyes- Gross vision intact, PERRLA, conjunctivae and secretions clear            Ears- Hearing, canals-normal            Nose- Clear, no-Septal dev, mucus, polyps, erosion, perforation             Throat- Mallampati III , mucosa clear , drainage- none, tonsils- absent,  +teeth Neck- flexible , trachea midline, no stridor , thyroid nl, carotid no bruit Chest - symmetrical excursion , unlabored           Heart/CV- RRR , no murmur , no gallop  , no rub, nl s1 s2                           - JVD- none , edema- none, stasis changes- none, varices- none           Lung- +breathless during speech on room air. + basilar crackles. wheeze- none, cough- none , dullness-none, rub- none           Chest wall-  Abd-  Br/ Gen/ Rectal- Not done, not indicated Extrem-+cane Neuro- +tremor R hand  Assessment and Plan    Obstructive Sleep Apnea Patient is using CPAP with oxygen supplementation 2L at night. Reports occasional discomfort due to moisture in the mask, which was resolved by adjusting the humidifier settings. CPAP download shows excellent control of apnea. Benefits with good compliance and control. -Continue current CPAP settings and nightly use.  Chronic Hypoxemic Respiratory Failure Patient is using supplemental oxygen at 2L via nasal cannula from a home concentrator and portable oxygen as needed. Patient reports occasional discomfort with the use of oxygen but plans to monitor oxygen saturation and use supplemental oxygen when needed. He can take off O2 when sitting as long as O2 sat stays =/> 89%. -Continue current oxygen settings and use as needed.  Pulmonary Fibrosis Managed by Dr. Marchelle Gearing. Patient reports minimal change in condition. -Continue follow-up with Dr. Marchelle Gearing.  Anticoagulation Patient is on Eliquis for a history of pulmonary embolism. -Continue Eliquis as prescribed.  General Health Maintenance / Followup  Plans -Encourage patient to continue walking for exercise as tolerated. -Follow-up in 1 year for sleep apnea management.

## 2023-04-25 ENCOUNTER — Telehealth: Payer: Self-pay | Admitting: Internal Medicine

## 2023-04-25 ENCOUNTER — Encounter: Payer: Self-pay | Admitting: Internal Medicine

## 2023-04-25 ENCOUNTER — Other Ambulatory Visit: Payer: Self-pay | Admitting: Cardiology

## 2023-04-25 ENCOUNTER — Ambulatory Visit: Payer: Medicare Other | Admitting: Internal Medicine

## 2023-04-25 VITALS — BP 103/70 | HR 93 | Temp 98.0°F | Resp 18 | Ht 71.0 in | Wt 201.6 lb

## 2023-04-25 DIAGNOSIS — G4733 Obstructive sleep apnea (adult) (pediatric): Secondary | ICD-10-CM

## 2023-04-25 NOTE — Telephone Encounter (Signed)
Prescription refill request for Eliquis received. Indication:aflutter Last office visit:12/24 Scr:0.77  12/24 Age: 73 Weight:91.4  kg  Prescription refilled

## 2023-04-25 NOTE — Telephone Encounter (Signed)
Patient assistance form for Eliqus will be sent to Dr.ramswamy to be sent for the patients lung disease.

## 2023-04-25 NOTE — Patient Instructions (Signed)
You are doing well with your CPAP and oxygen. We can continue current settings,  Keep your appointment to follow-up with Dr Marchelle Gearing as planned.

## 2023-04-25 NOTE — Telephone Encounter (Signed)
Pt's spouse requesting a cb with additional questions regarding pt assistance

## 2023-04-25 NOTE — Telephone Encounter (Signed)
Spoke with Robert Lang who states that the pharmacy will be requesting a RX for his Eliquis which his was approved for the co pay relief. Dr. Marchelle Gearing dx his fibrosus .

## 2023-04-26 NOTE — Telephone Encounter (Signed)
Forwarding to MR so he is aware. Putting this in his cabinet until Friday!!!!

## 2023-04-26 NOTE — Telephone Encounter (Signed)
I found the form. Robert Lang will not be back in office until Friday morning. This will be filled out, just needs his signature.

## 2023-04-26 NOTE — Telephone Encounter (Signed)
 Patient assistance form for Eliqus will be sent to Dr.ramswamy to be sent for the patients lung disease   I called and spoke with pt. Pt states he needs PAP for Eliquis . Pt referred me to speak with wife Jori, [place]. Wife states last year he was approved by [place]. Pt wife states that due to the pt being diagnosed with IPF, our office would have to fill out the papers. Pt wife states a paper should have been faxed to our office for the diagnosis of IPF. Pt wife states the Eliquis  is rx by Dr Bernie (cardiologist). I informed pt wife that if I don't see a form, I will call the number she gave me. Pt wife verbalized understanding.   Pt advocate [phone] - wife stated to ask for Harlene

## 2023-04-26 NOTE — Telephone Encounter (Signed)
 Spoke with Robert Lang, she is working with Dr. Geronimo  office to obtain prior auth  on Eliquis . The reason this route is we approaching this is because non of our diagnosis fit the criteria to et this approve. I told her let see how this plays and if when need to get involve to call us .

## 2023-05-02 ENCOUNTER — Other Ambulatory Visit: Payer: Self-pay | Admitting: Behavioral Health

## 2023-05-02 ENCOUNTER — Other Ambulatory Visit: Payer: Self-pay | Admitting: *Deleted

## 2023-05-02 DIAGNOSIS — Z87891 Personal history of nicotine dependence: Secondary | ICD-10-CM

## 2023-05-02 DIAGNOSIS — F32A Depression, unspecified: Secondary | ICD-10-CM

## 2023-05-02 DIAGNOSIS — F411 Generalized anxiety disorder: Secondary | ICD-10-CM

## 2023-05-02 DIAGNOSIS — Z122 Encounter for screening for malignant neoplasm of respiratory organs: Secondary | ICD-10-CM

## 2023-05-05 NOTE — Telephone Encounter (Signed)
I have signed it and I left it I POd A to the right of my desk

## 2023-05-09 ENCOUNTER — Ambulatory Visit: Payer: Medicare Other | Admitting: Family Medicine

## 2023-05-09 ENCOUNTER — Ambulatory Visit: Payer: Medicare Other | Admitting: Behavioral Health

## 2023-05-09 ENCOUNTER — Ambulatory Visit: Payer: Medicare Other | Admitting: Medical

## 2023-05-09 ENCOUNTER — Encounter (HOSPITAL_COMMUNITY): Payer: Self-pay | Admitting: Emergency Medicine

## 2023-05-09 ENCOUNTER — Other Ambulatory Visit: Payer: Self-pay | Admitting: Behavioral Health

## 2023-05-09 ENCOUNTER — Other Ambulatory Visit: Payer: Self-pay

## 2023-05-09 ENCOUNTER — Encounter: Payer: Self-pay | Admitting: Behavioral Health

## 2023-05-09 ENCOUNTER — Emergency Department (HOSPITAL_COMMUNITY)
Admission: EM | Admit: 2023-05-09 | Discharge: 2023-05-09 | Disposition: A | Discharge status: Home / Self Care | Payer: Medicare Other | Attending: Emergency Medicine | Admitting: Emergency Medicine

## 2023-05-09 ENCOUNTER — Emergency Department (HOSPITAL_COMMUNITY): Payer: Medicare Other

## 2023-05-09 ENCOUNTER — Telehealth: Payer: Self-pay

## 2023-05-09 DIAGNOSIS — D72829 Elevated white blood cell count, unspecified: Secondary | ICD-10-CM | POA: Diagnosis not present

## 2023-05-09 DIAGNOSIS — R1012 Left upper quadrant pain: Secondary | ICD-10-CM | POA: Diagnosis not present

## 2023-05-09 DIAGNOSIS — R454 Irritability and anger: Secondary | ICD-10-CM

## 2023-05-09 DIAGNOSIS — F411 Generalized anxiety disorder: Secondary | ICD-10-CM | POA: Diagnosis not present

## 2023-05-09 DIAGNOSIS — K5730 Diverticulosis of large intestine without perforation or abscess without bleeding: Secondary | ICD-10-CM | POA: Diagnosis not present

## 2023-05-09 DIAGNOSIS — F32A Depression, unspecified: Secondary | ICD-10-CM

## 2023-05-09 DIAGNOSIS — R1032 Left lower quadrant pain: Secondary | ICD-10-CM | POA: Diagnosis not present

## 2023-05-09 DIAGNOSIS — Z7901 Long term (current) use of anticoagulants: Secondary | ICD-10-CM | POA: Insufficient documentation

## 2023-05-09 DIAGNOSIS — Z79899 Other long term (current) drug therapy: Secondary | ICD-10-CM | POA: Diagnosis not present

## 2023-05-09 DIAGNOSIS — D1803 Hemangioma of intra-abdominal structures: Secondary | ICD-10-CM | POA: Diagnosis not present

## 2023-05-09 LAB — COMPREHENSIVE METABOLIC PANEL
ALT: 25 U/L (ref 0–44)
AST: 20 U/L (ref 15–41)
Albumin: 3.5 g/dL (ref 3.5–5.0)
Alkaline Phosphatase: 67 U/L (ref 38–126)
Anion gap: 13 (ref 5–15)
BUN: 12 mg/dL (ref 8–23)
CO2: 18 mmol/L — ABNORMAL LOW (ref 22–32)
Calcium: 8.9 mg/dL (ref 8.9–10.3)
Chloride: 101 mmol/L (ref 98–111)
Creatinine, Ser: 0.82 mg/dL (ref 0.61–1.24)
GFR, Estimated: >60 mL/min (ref >60)
Glucose, Bld: 104 mg/dL — ABNORMAL HIGH (ref 70–99)
Potassium: 4.4 mmol/L (ref 3.5–5.1)
Sodium: 132 mmol/L — ABNORMAL LOW (ref 135–145)
Total Bilirubin: 0.8 mg/dL (ref 0.0–1.2)
Total Protein: 7.3 g/dL (ref 6.5–8.1)

## 2023-05-09 LAB — URINALYSIS, ROUTINE W REFLEX MICROSCOPIC
Bilirubin Urine: NEGATIVE
Glucose, UA: NEGATIVE mg/dL
Hgb urine dipstick: NEGATIVE
Ketones, ur: NEGATIVE mg/dL
Leukocytes,Ua: NEGATIVE
Nitrite: NEGATIVE
Protein, ur: NEGATIVE mg/dL
Specific Gravity, Urine: 1.021 (ref 1.005–1.030)
pH: 5 (ref 5.0–8.0)

## 2023-05-09 LAB — CBC
HCT: 47.6 % (ref 39.0–52.0)
Hemoglobin: 15.3 g/dL (ref 13.0–17.0)
MCH: 30.4 pg (ref 26.0–34.0)
MCHC: 32.1 g/dL (ref 30.0–36.0)
MCV: 94.4 fL (ref 80.0–100.0)
Platelets: 198 10*3/uL (ref 150–400)
RBC: 5.04 MIL/uL (ref 4.22–5.81)
RDW: 14.4 % (ref 11.5–15.5)
WBC: 7 10*3/uL (ref 4.0–10.5)
nRBC: 0 % (ref 0.0–0.2)

## 2023-05-09 LAB — LIPASE, BLOOD: Lipase: 52 U/L — ABNORMAL HIGH (ref 11–51)

## 2023-05-09 MED ORDER — SERTRALINE HCL 100 MG PO TABS: 200.0000 mg | ORAL_TABLET | Freq: Every day | ORAL | 1 refills | Status: DC

## 2023-05-09 MED ORDER — OXYCODONE HCL 5 MG PO TABS: 5.0000 mg | ORAL_TABLET | ORAL | 0 refills | Status: DC | PRN

## 2023-05-09 MED ORDER — BUSPIRONE HCL 15 MG PO TABS
15.0000 mg | ORAL_TABLET | Freq: Three times a day (TID) | ORAL | 3 refills | Status: DC
Start: 2023-05-09 — End: 2023-08-11

## 2023-05-09 MED ORDER — ONDANSETRON 4 MG PO TBDP: ORAL_TABLET | ORAL | 0 refills | Status: DC

## 2023-05-09 MED ORDER — LAMOTRIGINE 100 MG PO TABS
100.0000 mg | ORAL_TABLET | Freq: Every day | ORAL | 1 refills | Status: DC
Start: 2023-05-09 — End: 2023-08-11

## 2023-05-09 MED ORDER — IOHEXOL 350 MG/ML SOLN
75.0000 mL | Freq: Once | INTRAVENOUS | Status: AC | PRN
  Administered 2023-05-09: 75 mL via INTRAVENOUS

## 2023-05-09 MED ORDER — BUPROPION HCL ER (XL) 150 MG PO TB24: 150.0000 mg | ORAL_TABLET | Freq: Every day | ORAL | 3 refills | Status: DC

## 2023-05-09 NOTE — Telephone Encounter (Signed)
Initial Comment Caller states has swelling to chest and side. It is firm. Translation No Nurse Assessment Nurse: Clarita Leber, RN, Deborah Date/Time (Eastern Time): 05/09/2023 10:03:07 AM Confirm and document reason for call. If symptomatic, describe symptoms. ---The caller states that her husband has something on his lower abdomen that swells. Was firm. Has been nauseated. Also having swelling. Does the patient have any new or worsening symptoms? ---Yes Will a triage be completed? ---Yes Related visit to physician within the last 2 weeks? ---No Does the PT have any chronic conditions? (i.e. diabetes, asthma, this includes High risk factors for pregnancy, etc.) ---Yes List chronic conditions. 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 ---No Guidelines Guideline Title Affirmed Question Affirmed Notes Nurse Date/Time (Eastern Time) Hernia Hernia is painful or tender to touch Womble, RN, Gavin Pound 05/09/2023 10:07:41 AM Disp. Time Lamount Cohen Time) Disposition Final User 05/09/2023 10:11:28 AM Go to ED Now Yes Clarita Leber, RN, Gavin Pound Final Disposition 05/09/2023 10:11:28 AM Go to ED Now Yes Clarita Leber, RN, Gavin Pound PLEASE NOTE: All timestamps contained within this report are represented as [place] Standard Time. CONFIDENTIALTY NOTICE: This fax transmission is intended only for the addressee. It contains information that is legally privileged, confidential or otherwise protected from use or disclosure. If you are not the intended recipient, you are strictly prohibited from reviewing, disclosing, copying using or disseminating any of this information or taking any action in reliance on or regarding this information. If you have received this fax in error, please notify us immediately by telephone so that we can arrange for its return to [place]. Phone: [phone], Toll-Free: [phone], Fax: [phone] [email or web address] 1951/02/02 Page: 2 of  2 Call Id: [id number] Caller Disagree/Comply Comply Caller Understands Yes PreDisposition Call Doctor Care Advice Given Per Guideline GO TO ED NOW: * Do not eat or drink anything for now. Comments User: Alita Chyle, RN Date/Time Lamount Cohen Time): 05/09/2023 10:13:56 AM This nurse spoke Kayla at the office and informed her that this nurse is sending this patient to the ED for evaluation and to cancel his 1:20 appointment today. Referrals [facility] - ED  [facility] - ED

## 2023-05-09 NOTE — Progress Notes (Signed)
[facility]  Patient ID: Robert Lang,  MRN: [id number]  PCP: [name], [name]  Date of Evaluation: 05/09/2023 Time spent:40 minutes  Chief Complaint:   HISTORY/CURRENT STATUS: HPI 73 yo Robert Lang male presents for follow up and medication management. His wife was with him today with his consent.  He is joking and trying to be in good spirits. His wife is very concerned.  Says he has swollen mass that popped up about a week ago on his right abdomen, left flank.  Says his abdomen is distended. York Spaniel they are on the way to ER after appointment to get it check out.He is questioning if he can come off any of his medication. She says his moods have been much better.  He is feeling down because now on 02  and rolls a tank.  He is open to adjusting his medication this visit.  Sleep is ok but utilizing Ambien and Trazodone. Sleeps with C-pap.  Rates anxiety 3/10 and depression 5/10. Denies hx of mania, no psychosis. No auditory or visual hallucinations. SI or HI   Individual Medical History/ Review of Systems: Changes? :No   Allergies: Naproxen and Rapaflo [silodosin]  Current Medications:  Current Outpatient Medications:    buPROPion (WELLBUTRIN XL) 150 MG 24 hr tablet, Take 1 tablet (150 mg total) by mouth daily., Disp: 30 tablet, Rfl: 3   acetaminophen (TYLENOL) 500 MG tablet, Take 1 tablet (500 mg total) by mouth every 8 (eight) hours as needed for moderate pain., Disp: 100 tablet, Rfl: 0   apixaban (ELIQUIS) 5 MG TABS tablet, TAKE 1 TABLET(5 MG) BY MOUTH TWICE DAILY, Disp: 60 tablet, Rfl: 5   atorvastatin (LIPITOR) 80 MG tablet, TAKE 1 TABLET(80 MG) BY MOUTH DAILY (Patient taking differently: Take 80 mg by mouth daily.), Disp: 90 tablet, Rfl: 2   buPROPion (WELLBUTRIN XL) 300 MG 24 hr tablet, TAKE 1 TABLET(300 MG) BY MOUTH DAILY, Disp: 30 tablet, Rfl: 0   busPIRone (BUSPAR) 15 MG tablet, Take 1 tablet (15 mg total) by mouth 3 (three) times daily., Disp: 90 tablet, Rfl: 3    Cholecalciferol (VITAMIN D3) 50 MCG (2000 UT) TABS, Take 4,000 Units by mouth 2 (two) times daily., Disp: , Rfl:    docusate sodium (COLACE) 100 MG capsule, Take 100 mg by mouth daily., Disp: , Rfl:    famotidine (PEPCID) 40 MG tablet, TAKE 1 TABLET(40 MG) BY MOUTH AT BEDTIME (Patient taking differently: Take 40 mg by mouth at bedtime.), Disp: 90 tablet, Rfl: 2   fexofenadine (ALLEGRA) 180 MG tablet, Take 180 mg by mouth at bedtime. , Disp: , Rfl:    finasteride (PROSCAR) 5 MG tablet, Take 1 tablet (5 mg total) by mouth daily. (Patient taking differently: Take 5 mg by mouth at bedtime.), Disp: 90 tablet, Rfl: 1   lamoTRIgine (LAMICTAL) 100 MG tablet, Take 1 tablet (100 mg total) by mouth daily., Disp: 90 tablet, Rfl: 1   levalbuterol (XOPENEX) 1.25 MG/0.5ML nebulizer solution, Take 1.25 mg by nebulization every 4 (four) hours as needed for wheezing or shortness of breath., Disp: 1 each, Rfl: 12   LORazepam (ATIVAN) 0.5 MG tablet, Take one tablet by mouth only for severe anxiety or agitation (Patient taking differently: Take 0.5 mg by mouth daily as needed for anxiety. Take one tablet by mouth only for severe anxiety or agitation. Has not taking medication yet.), Disp: 30 tablet, Rfl: 1   Melatonin 10 MG TABS, Take 10 mg by mouth at bedtime., Disp: , Rfl:  MUCINEX 600 MG 12 hr tablet, TAKE 2 TABLETS BY MOUTH TWICE DAILY, Disp: 120 tablet, Rfl: 0   Multiple Vitamins-Minerals (PRESERVISION AREDS PO), Take 1 capsule by mouth in the morning and at bedtime., Disp: , Rfl:    nitroGLYCERIN (NITROSTAT) 0.4 MG SL tablet, Place 1 tablet (0.4 mg total) under the tongue every 5 (five) minutes as needed for chest pain., Disp: 25 tablet, Rfl: 3   ondansetron (ZOFRAN) 4 MG tablet, Take 1 tablet (4 mg total) by mouth every 8 (eight) hours as needed for nausea or vomiting., Disp: 20 tablet, Rfl: 0   ondansetron (ZOFRAN) 4 MG tablet, Take 1 tablet (4 mg total) by mouth every 8 (eight) hours as needed for nausea or  vomiting., Disp: 20 tablet, Rfl: 0   oxyCODONE (OXY IR/ROXICODONE) 5 MG immediate release tablet, Take 1 tablet (5 mg total) by mouth every 6 (six) hours as needed for severe pain., Disp: 15 tablet, Rfl: 0   OXYGEN, Inhale 2 L into the lungs daily., Disp: , Rfl:    pantoprazole (PROTONIX) 40 MG tablet, Take 1 tablet (40 mg total) by mouth daily. Please call [phone] to schedule an office visit for more refills, Disp: 90 tablet, Rfl: 1   Pirfenidone (ESBRIET) 801 MG TABS, Take 1 tablet (801 mg total) by mouth 3 (three) times daily., Disp: 270 tablet, Rfl: 1   polyethylene glycol powder (GLYCOLAX/MIRALAX) 17 GM/SCOOP powder, Take 17 g by mouth daily., Disp: , Rfl:    pregabalin (LYRICA) 150 MG capsule, TAKE 1 CAPSULE(150 MG) BY MOUTH TWICE DAILY, Disp: 90 capsule, Rfl: 2   ranolazine (RANEXA) 1000 MG SR tablet, Take 1 tablet (1,000 mg total) by mouth 2 (two) times daily., Disp: 180 tablet, Rfl: 2   sertraline (ZOLOFT) 100 MG tablet, Take 2 tablets (200 mg total) by mouth at bedtime., Disp: 180 tablet, Rfl: 1   Sodium Chloride Flush (NORMAL SALINE FLUSH) 0.9 % SOLN, Flush drain with 5 mLs daily. Discard remaining 5ml in each syringe., Disp: 200 mL, Rfl: 0   traZODone (DESYREL) 50 MG tablet, Take 1 tablet (50 mg total) by mouth at bedtime., Disp: 90 tablet, Rfl: 1   vitamin B-12 (CYANOCOBALAMIN) 1000 MCG tablet, Take 1,000 mcg by mouth daily., Disp: , Rfl:    zolpidem (AMBIEN) 5 MG tablet, TAKE 1 TABLET(5 MG) BY MOUTH AT BEDTIME AS NEEDED FOR SLEEP, Disp: 30 tablet, Rfl: 1 No current facility-administered medications for this visit.  Facility-Administered Medications Ordered in Other Visits:    regadenoson (LEXISCAN) injection SOLN 0.4 mg, 0.4 mg, Intravenous, Once, Tobb, Kardie, DO   technetium tetrofosmin (TC-MYOVIEW) injection 31.7 millicurie, 31.7 millicurie, Intravenous, Once PRN, Tobb, Kardie, DO Medication Side Effects: none  Family Medical/ Social History: Changes? No  MENTAL HEALTH  EXAM:  There were no vitals taken for this visit.There is no height or weight on file to calculate BMI.  General Appearance: Casual, Neat, and Well Groomed with 02 tank  Eye Contact:  Good  Speech:  Clear and Coherent  Volume:  Normal  Mood:  NA  Affect:  Appropriate  Thought Process:  Coherent  Orientation:  Full (Time, Place, and Person)  Thought Content: Logical   Suicidal Thoughts:  No  Homicidal Thoughts:  No  Memory:  WNL  Judgement:  Good  Insight:  Good  Psychomotor Activity:  Normal  Concentration:  Concentration: Good  Recall:  Good  Fund of Knowledge: Good  Language: Good  Assets:  Desire for Improvement  ADL's:  Intact  Cognition: WNL  Prognosis:  Good    DIAGNOSES:    ICD-10-CM   1. Generalized anxiety disorder  F41.1 busPIRone (BUSPAR) 15 MG tablet    sertraline (ZOLOFT) 100 MG tablet    2. Irritability  R45.4 lamoTRIgine (LAMICTAL) 100 MG tablet    3. Depressive disorder  F32.A sertraline (ZOLOFT) 100 MG tablet      Receiving Psychotherapy: No    RECOMMENDATIONS:  Greater than 50% of 40 min face to face time with patient was spent on counseling and coordination of care. Discussed his significant improvement with moods since last visit. His wife feels like he is in a good place. He requested I examine his abdomen because he was wanting to go to ER after visit. He is very distended and firm epigastric area. He appears to have large  firm swollen mass on his left mid abdomen/flank that was concerning and I did recommend they promptly follow up at the ER.    We agreed to:   To continue Zoloft to 200  mg daily To continue Trazodone 50 mg at bedtime To continue Buspar  to 15 mg three times daily To continue Lamictal to 100 mg. To continue Ativan 0.5 mg once daily prn only for severe anxiety or agitation. To decrease Wellbutrin to 150 mg XL daily Will report worsening symptoms or side effects promptly To follow up in 12 weeks per pt Provided emergency  contact information Discussed potential benefits, risk, and side effects of benzodiazepines to include potential risk of tolerance and dependence, as well as possible drowsiness.  Advised patient not to drive if experiencing drowsiness and to take lowest possible effective dose to minimize risk of dependence and tolerance.  Monitor for any sign of rash. Please taking Lamictal and contact office immediately rash develops. Recommend seeking urgent medical attention if rash is severe and/or spreading quickly.  Reviewed PDMP     Joan Flores, NP

## 2023-05-09 NOTE — ED Provider Notes (Signed)
[place] EMERGENCY DEPARTMENT AT [facility] Provider Note   CSN: [id number] Arrival date & time: 05/09/23  1055     History {Add pertinent medical, surgical, social history, OB history to HPI:1} Chief Complaint  Patient presents with   Abdominal Pain    Robert Lang is a 73 y.o. male.   Abdominal Pain  This patient is a 73 year old male, prior history of bilateral inguinal hernia repairs, a ventral hernia repair, cholecystectomy, appendectomy, no history of diverticulitis that Robert Lang knows of.  Robert Lang presents to the hospital with complaint of left-sided abdominal pain and swelling which has been going on for about a week.  Robert Lang has noticed that the lateral lower aspect of his abdomen has "pooched" out, Robert Lang feels swollen, Robert Lang denies vomiting but has been nauseated, but has had normal stools and normal urinary pattern which means some hesitancy which is chronic for him.  No fevers or chills, has not been seen by his family doctor for this.  Robert Lang is on Eliquis    Home Medications Prior to Admission medications   Medication Sig Start Date End Date Taking? Authorizing Provider  acetaminophen (TYLENOL) 500 MG tablet Take 1 tablet (500 mg total) by mouth every 8 (eight) hours as needed for moderate pain. 10/09/22   Leroy Sea, MD  apixaban (ELIQUIS) 5 MG TABS tablet TAKE 1 TABLET(5 MG) BY MOUTH TWICE DAILY 04/25/23   Georgeanna Lea, MD  atorvastatin (LIPITOR) 80 MG tablet TAKE 1 TABLET(80 MG) BY MOUTH DAILY Patient taking differently: Take 80 mg by mouth daily. 01/28/23   Georgeanna Lea, MD  buPROPion (WELLBUTRIN XL) 150 MG 24 hr tablet Take 1 tablet (150 mg total) by mouth daily. 05/09/23   Joan Flores, NP  buPROPion (WELLBUTRIN XL) 300 MG 24 hr tablet TAKE 1 TABLET(300 MG) BY MOUTH DAILY 05/03/23   Avelina Laine A, NP  busPIRone (BUSPAR) 15 MG tablet Take 1 tablet (15 mg total) by mouth 3 (three) times daily. 05/09/23   Joan Flores, NP  Cholecalciferol (VITAMIN D3) 50  MCG (2000 UT) TABS Take 4,000 Units by mouth 2 (two) times daily.    [provider]  docusate sodium (COLACE) 100 MG capsule Take 100 mg by mouth daily.    [provider]  famotidine (PEPCID) 40 MG tablet TAKE 1 TABLET(40 MG) BY MOUTH AT BEDTIME Patient taking differently: Take 40 mg by mouth at bedtime. 08/11/22   Georgeanna Lea, MD  fexofenadine (ALLEGRA) 180 MG tablet Take 180 mg by mouth at bedtime.  04/09/08   [provider]  finasteride (PROSCAR) 5 MG tablet Take 1 tablet (5 mg total) by mouth daily. Patient taking differently: Take 5 mg by mouth at bedtime. 03/10/21   Georgeanna Lea, MD  lamoTRIgine (LAMICTAL) 100 MG tablet Take 1 tablet (100 mg total) by mouth daily. 05/09/23   Joan Flores, NP  levalbuterol (XOPENEX) 1.25 MG/0.5ML nebulizer solution Take 1.25 mg by nebulization every 4 (four) hours as needed for wheezing or shortness of breath. 09/18/22   Regalado, Belkys A, MD  LORazepam (ATIVAN) 0.5 MG tablet Take one tablet by mouth only for severe anxiety or agitation Patient taking differently: Take 0.5 mg by mouth daily as needed for anxiety. Take one tablet by mouth only for severe anxiety or agitation. Has not taking medication yet. 08/20/22   Joan Flores, NP  Melatonin 10 MG TABS Take 10 mg by mouth at bedtime.    [provider]  MUCINEX 600 MG 12 hr tablet TAKE 2 TABLETS BY MOUTH TWICE DAILY 11/18/22   [name], [name], [name]  Multiple Vitamins-Minerals (PRESERVISION AREDS PO) Take 1 capsule by mouth in the morning and at bedtime.    [provider]  nitroGLYCERIN (NITROSTAT) 0.4 MG SL tablet Place 1 tablet (0.4 mg total) under the tongue every 5 (five) minutes as needed for chest pain. 03/10/21   Georgeanna Lea, MD  ondansetron (ZOFRAN) 4 MG tablet Take 1 tablet (4 mg total) by mouth every 8 (eight) hours as needed for nausea or vomiting. 10/09/22   Leroy Sea, MD  ondansetron (ZOFRAN) 4 MG tablet Take 1 tablet (4  mg total) by mouth every 8 (eight) hours as needed for nausea or vomiting. 03/01/23   [name], [name], [name]  oxyCODONE (OXY IR/ROXICODONE) 5 MG immediate release tablet Take 1 tablet (5 mg total) by mouth every 6 (six) hours as needed for severe pain. 12/21/22   Kinsinger, De Blanch, MD  OXYGEN Inhale 2 L into the lungs daily.    [provider]  pantoprazole (PROTONIX) 40 MG tablet Take 1 tablet (40 mg total) by mouth daily. Please call [phone] to schedule an office visit for more refills 02/14/23   Lynann Bologna, MD  Pirfenidone (ESBRIET) 801 MG TABS Take 1 tablet (801 mg total) by mouth 3 (three) times daily. 03/28/23   Kalman Shan, MD  polyethylene glycol powder (GLYCOLAX/MIRALAX) 17 GM/SCOOP powder Take 17 g by mouth daily.    [provider]  pregabalin (LYRICA) 150 MG capsule TAKE 1 CAPSULE(150 MG) BY MOUTH TWICE DAILY 04/18/23   Windell Norfolk, MD  ranolazine (RANEXA) 1000 MG SR tablet Take 1 tablet (1,000 mg total) by mouth 2 (two) times daily. 01/17/23   Georgeanna Lea, MD  sertraline (ZOLOFT) 100 MG tablet Take 2 tablets (200 mg total) by mouth at bedtime. 05/09/23   Joan Flores, NP  Sodium Chloride Flush (NORMAL SALINE FLUSH) 0.9 % SOLN Flush drain with 5 mLs daily. Discard remaining 5ml in each syringe. 09/01/22   Burnadette Pop, MD  traZODone (DESYREL) 50 MG tablet Take 1 tablet (50 mg total) by mouth at bedtime. 12/28/22   Joan Flores, NP  vitamin B-12 (CYANOCOBALAMIN) 1000 MCG tablet Take 1,000 mcg by mouth daily.    [provider]  zolpidem (AMBIEN) 5 MG tablet TAKE 1 TABLET(5 MG) BY MOUTH AT BEDTIME AS NEEDED FOR SLEEP 04/21/23   Windell Norfolk, MD      Allergies    Naproxen and Rapaflo [silodosin]    Review of Systems   Review of Systems  Gastrointestinal:  Positive for abdominal pain.  All other systems reviewed and are negative.   Physical Exam Updated Vital Signs BP 134/73 (BP Location: Right Arm)   Pulse 85   Temp 98.4 F  (36.9 C)   Resp 20   Ht 1.803 m (5\' 11" )   Wt 91 kg   SpO2 99%   BMI 27.98 kg/m  Physical Exam Vitals and nursing note reviewed.  Constitutional:      General: Robert Lang is not in acute distress.    Appearance: Robert Lang is well-developed.  HENT:     Head: Normocephalic and atraumatic.     Mouth/Throat:     Pharynx: No oropharyngeal exudate.  Eyes:     General: No scleral icterus.       Right eye: No discharge.        Left eye: No discharge.     Conjunctiva/sclera:  Conjunctivae normal.     Pupils: Pupils are equal, round, and reactive to light.  Neck:     Thyroid: No thyromegaly.     Vascular: No JVD.  Cardiovascular:     Rate and Rhythm: Normal rate and regular rhythm.     Heart sounds: Normal heart sounds. No murmur heard.    No friction rub. No gallop.  Pulmonary:     Effort: Pulmonary effort is normal. No respiratory distress.     Breath sounds: Rales present. No wheezing.     Comments: Subtle rales in all lung fields, history of interstitial pulmonary fibrosis Abdominal:     General: Bowel sounds are normal. There is distension.     Palpations: Abdomen is soft. There is no mass.     Tenderness: There is abdominal tenderness.     Comments: There is tenderness and some swelling in the left abdomen, no erythema or induration, no right lower quadrant abdominal tenderness, no peritoneal signs  Musculoskeletal:        General: No tenderness. Normal range of motion.     Cervical back: Normal range of motion and neck supple.     Right lower leg: No edema.     Left lower leg: No edema.  Lymphadenopathy:     Cervical: No cervical adenopathy.  Skin:    General: Skin is warm and dry.     Findings: No erythema or rash.  Neurological:     General: No focal deficit present.     Mental Status: Robert Lang is alert.     Coordination: Coordination normal.  Psychiatric:        Behavior: Behavior normal.     ED Results / Procedures / Treatments   Labs (all labs ordered are listed, but only  abnormal results are displayed) Labs Reviewed  LIPASE, BLOOD - Abnormal; Notable for the following components:      Result Value   Lipase 52 (*)    All other components within normal limits  COMPREHENSIVE METABOLIC PANEL - Abnormal; Notable for the following components:   Sodium 132 (*)    CO2 18 (*)    Glucose, Bld 104 (*)    All other components within normal limits  URINALYSIS, ROUTINE W REFLEX MICROSCOPIC - Abnormal; Notable for the following components:   Color, Urine AMBER (*)    All other components within normal limits  CBC    EKG None  Radiology No results found.  Procedures Procedures  {Document cardiac monitor, telemetry assessment procedure when appropriate:1}  Medications Ordered in ED Medications - No data to display  ED Course/ Medical Decision Making/ A&P   {   Click here for ABCD2, HEART and other calculatorsREFRESH Note before signing :1}                              Medical Decision Making Amount and/or Complexity of Data Reviewed Labs: ordered. Radiology: ordered.    This patient presents to the ED for concern of abdominal discomfort on the left, this involves an extensive number of treatment options, and is a complaint that carries with it a high risk of complications and morbidity.  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  The pertinent results include: CBC with leukocytosis, lipase and metabolic panel unremarkable   Imaging Studies ordered:  I ordered imaging studies including CT of the  abdomen and pelvis I independently visualized and interpreted imaging which showed *** I agree with the radiologist interpretation   Cardiac Monitoring: / EKG:  The patient was maintained on a cardiac monitor.  I personally viewed and interpreted the cardiac monitored which showed an underlying rhythm of: ***   Consultations Obtained:  I requested consultation with the ***,  and discussed lab and imaging findings as well as pertinent plan - they recommend: ***   Problem List / ED Course / Critical interventions / Medication management  *** I ordered medication including ***  for ***  Reevaluation of the patient after these medicines showed that the patient {resolved/improved/worsened:23923::"improved"} I have reviewed the patients home medicines and have made adjustments as needed   Social Determinants of Health:  ***   Test / Admission - Considered:  ***   {Document critical care time when appropriate:1} {Document review of labs and clinical decision tools ie heart score, Chads2Vasc2 etc:1}  {Document your independent review of radiology images, and any outside records:1} {Document your discussion with family members, caretakers, and with consultants:1} {Document social determinants of health affecting pt's care:1} {Document your decision making why or why not admission, treatments were needed:1} Final Clinical Impression(s) / ED Diagnoses Final diagnoses:  None    Rx / DC Orders ED Discharge Orders     None

## 2023-05-09 NOTE — Telephone Encounter (Signed)
 Pt in ED.

## 2023-05-09 NOTE — ED Triage Notes (Signed)
PT BIB wife with complaints of left sided abd swelling. First noticed last week. Tender to touch. Denies any injury. PT called PCP and was advised to come to ER. Pt denies pain yet describes as a soreness.

## 2023-05-09 NOTE — ED Provider Notes (Signed)
  Physical Exam  BP (!) 146/84   Pulse 76   Temp 98.2 F (36.8 C) (Oral)   Resp 16   Ht 5\' 11"  (1.803 m)   Wt 91 kg   SpO2 96%   BMI 27.98 kg/m   Physical Exam  Procedures  Procedures  ED Course / MDM    Medical Decision Making Care assumed at 3 PM.  Patient is here with left-sided abdominal distention and pain.  PCP sent him for CT abdomen pelvis to rule out intra-abdominal pathology.  Concern for possible diverticulitis and signed out pending CT abdomen pelvis  8:08 PM CT abdomen pelvis unremarkable.  In particular, there is no diverticulitis but he does have some diverticulosis.  White blood cell count is normal.  I do not know what is causing his left-sided abdominal pain.  Patient request some nausea medicine and pain medicine and he states that he will follow-up with his doctor  Problems Addressed: Left upper quadrant abdominal pain: acute illness or injury  Amount and/or Complexity of Data Reviewed Labs: ordered. Decision-making details documented in ED Course. Radiology: ordered and independent interpretation performed. Decision-making details documented in ED Course. ECG/medicine tests: ordered and independent interpretation performed. Decision-making details documented in ED Course.  Risk Prescription drug management.          Charlynne Pander, MD 05/09/23 2009

## 2023-05-09 NOTE — Discharge Instructions (Addendum)
As we discussed your CT scan was unremarkable.  I have prescribed some nausea medicine and pain medicine  You need to follow-up with your doctor  Return to ER if you have severe abdominal pain or vomiting or dehydration

## 2023-05-10 ENCOUNTER — Ambulatory Visit: Payer: Self-pay | Admitting: Medical

## 2023-05-10 ENCOUNTER — Telehealth: Payer: Self-pay

## 2023-05-10 DIAGNOSIS — I10 Essential (primary) hypertension: Secondary | ICD-10-CM | POA: Diagnosis not present

## 2023-05-10 DIAGNOSIS — Z86711 Personal history of pulmonary embolism: Secondary | ICD-10-CM | POA: Diagnosis not present

## 2023-05-10 DIAGNOSIS — A419 Sepsis, unspecified organism: Secondary | ICD-10-CM | POA: Diagnosis not present

## 2023-05-10 DIAGNOSIS — I2693 Single subsegmental pulmonary embolism without acute cor pulmonale: Secondary | ICD-10-CM | POA: Diagnosis not present

## 2023-05-10 NOTE — Telephone Encounter (Signed)
Copied from CRM [id number]. Topic: Clinical - Medical Advice >> May 10, 2023  8:13 AM Elizebeth Brooking wrote: Reason for CRM: Patient wife called in stating they went to the ER like was discussed regarding a growth on patient, nothing on the CT scans , the ER told the patient they will need to be seen by his pcp to figure out where to go from here. Patient wife was speaking with nurse yesterday, she stated she would like for nurse to give her a callback today regarding on what to do Reason for Disposition  Requesting regular office appointment  Answer Assessment - Initial Assessment Questions 1. REASON FOR CALL or QUESTION: "What is your reason for calling today?" or "How can I best help you?" or "What question do you have that I can help answer?"     Called back & spoke with pt: pt stated he was not pleased with testing done at ED b/c he still does not know what is wrong with him: stated feels like they should have done more testing.  Would like to see if he could set something up with PCP next week to follow-up: pt stated could not come in today and the rest of the week the weather is to be bad.  Pt states abd growth still present & hurts when pressure is applied and/or when you push on area therefore would like his PCP to take a look at it.  Nurse scheduled followup with PCP for 05/16/2023.  Protocols used: Information Only Call - No Triage-A-AH

## 2023-05-10 NOTE — Transitions of Care (Post Inpatient/ED Visit) (Signed)
05/10/2023  Name: Robert Lang MRN: [id number] DOB: 1950-12-05  Today's TOC FU Call Status: Today's TOC FU Call Status:: Successful TOC FU Call Completed TOC FU Call Complete Date: 05/10/23 Patient's Name and Date of Birth confirmed.  Transition Care Management Follow-up Telephone Call Date of Discharge: 05/09/23 Discharge Facility: [facility]) Type of Discharge: Emergency Department Reason for ED Visit: Other: (LUQ pain) How have you been since you were released from the hospital?: Same  Items Reviewed: Did you receive and understand the discharge instructions provided?: Yes Medications obtained,verified, and reconciled?: Yes (Medications Reviewed) Any new allergies since your discharge?: No Dietary orders reviewed?: Yes Do you have support at home?: Yes People in Home: spouse  Medications Reviewed Today: Medications Reviewed Today     Reviewed by Karena Addison, LPN (Licensed Practical Nurse) on 05/10/23 at 1546  Med List Status: <None>   Medication Order Taking? Sig Documenting Provider Last Dose Status Informant  acetaminophen (TYLENOL) 500 MG tablet [id number] No Take 1 tablet (500 mg total) by mouth every 8 (eight) hours as needed for moderate pain. Leroy Sea, MD Taking Active Spouse/Significant Other  apixaban (ELIQUIS) 5 MG TABS tablet [id number]  TAKE 1 TABLET(5 MG) BY MOUTH TWICE DAILY Georgeanna Lea, MD  Active   atorvastatin (LIPITOR) 80 MG tablet [id number] No TAKE 1 TABLET(80 MG) BY MOUTH DAILY  Patient taking differently: Take 80 mg by mouth daily.   Georgeanna Lea, MD Taking Active   buPROPion (WELLBUTRIN XL) 150 MG 24 hr tablet [id number]  Take 1 tablet (150 mg total) by mouth daily. Joan Flores, NP  Active   buPROPion (WELLBUTRIN XL) 300 MG 24 hr tablet [id number]  TAKE 1 TABLET(300 MG) BY MOUTH DAILY White, Brian A, NP  Active   busPIRone (BUSPAR) 15 MG tablet [id number]  Take 1 tablet (15 mg total) by mouth 3 (three) times daily.  Joan Flores, NP  Active   Cholecalciferol (VITAMIN D3) 50 MCG (2000 UT) TABS [id number] No Take 4,000 Units by mouth 2 (two) times daily. [provider] Taking Active Spouse/Significant Other  docusate sodium (COLACE) 100 MG capsule [id number] No Take 100 mg by mouth daily. [provider] Taking Active Spouse/Significant Other  famotidine (PEPCID) 40 MG tablet [id number] No TAKE 1 TABLET(40 MG) BY MOUTH AT BEDTIME  Patient taking differently: Take 40 mg by mouth at bedtime.   Georgeanna Lea, MD Taking Active Spouse/Significant Other  fexofenadine (ALLEGRA) 180 MG tablet [id number] No Take 180 mg by mouth at bedtime.  [provider] Taking Active Spouse/Significant Other  finasteride (PROSCAR) 5 MG tablet [id number] No Take 1 tablet (5 mg total) by mouth daily.  Patient taking differently: Take 5 mg by mouth at bedtime.   Georgeanna Lea, MD Taking Active Spouse/Significant Other  lamoTRIgine (LAMICTAL) 100 MG tablet [id number]  Take 1 tablet (100 mg total) by mouth daily. Joan Flores, NP  Active   levalbuterol Pauline Aus) 1.25 MG/0.5ML nebulizer solution [id number] No Take 1.25 mg by nebulization every 4 (four) hours as needed for wheezing or shortness of breath. Regalado, Prentiss Bells, MD Taking Active Spouse/Significant Other  LORazepam (ATIVAN) 0.5 MG tablet [id number] No Take one tablet by mouth only for severe anxiety or agitation  Patient taking differently: Take 0.5 mg by mouth daily as needed for anxiety. Take one tablet by mouth only for severe anxiety or agitation. Has not taking medication yet.   Joan Flores, NP Taking Active Spouse/Significant Other  Med Note (SATTERFIELD, Genoveva Ill   Wed Oct 06, 2022  7:26 PM)    Melatonin 10 MG TABS [id number] No Take 10 mg by mouth at bedtime. [provider] Taking Active Spouse/Significant Other  MUCINEX 600 MG 12 hr tablet [id number] No TAKE 2 TABLETS BY MOUTH TWICE DAILY [name], [name], [name]  Taking Active Spouse/Significant Other  Multiple Vitamins-Minerals (PRESERVISION AREDS PO) [id number] No Take 1 capsule by mouth in the morning and at bedtime. [provider] Taking Active Spouse/Significant Other  nitroGLYCERIN (NITROSTAT) 0.4 MG SL tablet [id number] No Place 1 tablet (0.4 mg total) under the tongue every 5 (five) minutes as needed for chest pain. Georgeanna Lea, MD Taking Active Spouse/Significant Other  ondansetron (ZOFRAN) 4 MG tablet [id number] No Take 1 tablet (4 mg total) by mouth every 8 (eight) hours as needed for nausea or vomiting. Leroy Sea, MD Taking Active Spouse/Significant Other  ondansetron (ZOFRAN) 4 MG tablet [id number] No Take 1 tablet (4 mg total) by mouth every 8 (eight) hours as needed for nausea or vomiting. [name], [name], [name] Taking Active   ondansetron (ZOFRAN-ODT) 4 MG disintegrating tablet [id number]  4mg  ODT q4 hours prn nausea/vomit Charlynne Pander, MD  Active   oxyCODONE (ROXICODONE) 5 MG immediate release tablet [id number]  Take 1 tablet (5 mg total) by mouth every 4 (four) hours as needed for severe pain (pain score 7-10). Charlynne Pander, MD  Active   OXYGEN [id number] No Inhale 2 L into the lungs daily. [provider] Taking Active   pantoprazole (PROTONIX) 40 MG tablet [id number] No Take 1 tablet (40 mg total) by mouth daily. Please call [phone] to schedule an office visit for more refills Lynann Bologna, MD Taking Active   Pirfenidone (ESBRIET) 801 MG TABS [id number] No Take 1 tablet (801 mg total) by mouth 3 (three) times daily. Kalman Shan, MD Taking Active   polyethylene glycol powder (GLYCOLAX/MIRALAX) 17 GM/SCOOP powder [id number] No Take 17 g by mouth daily. [provider] Taking Active Spouse/Significant Other  pregabalin (LYRICA) 150 MG capsule [id number] No TAKE 1 CAPSULE(150 MG) BY MOUTH TWICE DAILY Camara, Amadou, MD Taking Active   ranolazine (RANEXA) 1000 MG SR tablet [id number] No Take 1  tablet (1,000 mg total) by mouth 2 (two) times daily. Georgeanna Lea, MD Taking Active   sertraline (ZOLOFT) 100 MG tablet [id number]  Take 2 tablets (200 mg total) by mouth at bedtime. Joan Flores, NP  Active   Sodium Chloride Flush (NORMAL SALINE FLUSH) 0.9 % SOLN [id number] No Flush drain with 5 mLs daily. Discard remaining 5ml in each syringe. Burnadette Pop, MD Taking Active Spouse/Significant Other  traZODone (DESYREL) 50 MG tablet [id number] No Take 1 tablet (50 mg total) by mouth at bedtime. Joan Flores, NP Taking Active   vitamin B-12 (CYANOCOBALAMIN) 1000 MCG tablet [id number] No Take 1,000 mcg by mouth daily. [provider] Taking Active Spouse/Significant Other  zolpidem (AMBIEN) 5 MG tablet [id number] No TAKE 1 TABLET(5 MG) BY MOUTH AT BEDTIME AS NEEDED FOR SLEEP Windell Norfolk, MD Taking Active             Home Care and Equipment/Supplies: Were Home Health Services Ordered?: NA Any new equipment or medical supplies ordered?: NA  Functional Questionnaire: Do you need assistance with bathing/showering or dressing?: No Do you need assistance with meal preparation?: No Do you need assistance with eating?: No Do you have difficulty maintaining continence: No Do you need assistance with getting out of bed/getting out  of a chair/moving?: No Do you have difficulty managing or taking your medications?: No  Follow up appointments reviewed: PCP Follow-up appointment confirmed?: Yes Date of PCP follow-up appointment?: 05/16/23 Follow-up Provider: [facility] Follow-up appointment confirmed?: NA Do you need transportation to your follow-up appointment?: No Do you understand care options if your condition(s) worsen?: Yes-patient verbalized understanding    SIGNATURE Karena Addison, LPN [facility] Nurse Health Advisor Direct Dial [phone]

## 2023-05-11 ENCOUNTER — Ambulatory Visit: Payer: Medicare Other | Admitting: Neurology

## 2023-05-12 DIAGNOSIS — A419 Sepsis, unspecified organism: Secondary | ICD-10-CM | POA: Diagnosis not present

## 2023-05-12 DIAGNOSIS — I2693 Single subsegmental pulmonary embolism without acute cor pulmonale: Secondary | ICD-10-CM | POA: Diagnosis not present

## 2023-05-12 DIAGNOSIS — Z86711 Personal history of pulmonary embolism: Secondary | ICD-10-CM | POA: Diagnosis not present

## 2023-05-12 DIAGNOSIS — I10 Essential (primary) hypertension: Secondary | ICD-10-CM | POA: Diagnosis not present

## 2023-05-16 ENCOUNTER — Telehealth: Payer: Self-pay | Admitting: *Deleted

## 2023-05-16 ENCOUNTER — Ambulatory Visit (INDEPENDENT_AMBULATORY_CARE_PROVIDER_SITE_OTHER): Payer: Medicare Other | Admitting: Medical

## 2023-05-16 ENCOUNTER — Telehealth: Payer: Self-pay

## 2023-05-16 VITALS — BP 110/62 | HR 95 | Resp 18 | Ht 71.0 in | Wt 209.9 lb

## 2023-05-16 DIAGNOSIS — R110 Nausea: Secondary | ICD-10-CM

## 2023-05-16 DIAGNOSIS — R1032 Left lower quadrant pain: Secondary | ICD-10-CM | POA: Diagnosis not present

## 2023-05-16 DIAGNOSIS — F419 Anxiety disorder, unspecified: Secondary | ICD-10-CM

## 2023-05-16 DIAGNOSIS — K5792 Diverticulitis of intestine, part unspecified, without perforation or abscess without bleeding: Secondary | ICD-10-CM | POA: Diagnosis not present

## 2023-05-16 DIAGNOSIS — G4700 Insomnia, unspecified: Secondary | ICD-10-CM

## 2023-05-16 DIAGNOSIS — I10 Essential (primary) hypertension: Secondary | ICD-10-CM

## 2023-05-16 LAB — COMPREHENSIVE METABOLIC PANEL
AG Ratio: 1.5 (calc) (ref 1.0–2.5)
ALT: 17 U/L (ref 9–46)
AST: 13 U/L (ref 10–35)
Albumin: 4 g/dL (ref 3.6–5.1)
Alkaline phosphatase (APISO): 73 U/L (ref 35–144)
BUN: 16 mg/dL (ref 7–25)
CO2: 24 mmol/L (ref 20–32)
Calcium: 9.1 mg/dL (ref 8.6–10.3)
Chloride: 104 mmol/L (ref 98–110)
Creat: 0.85 mg/dL (ref 0.70–1.28)
Globulin: 2.6 g/dL (ref 1.9–3.7)
Glucose, Bld: 121 mg/dL — ABNORMAL HIGH (ref 65–99)
Potassium: 4.3 mmol/L (ref 3.5–5.3)
Sodium: 137 mmol/L (ref 135–146)
Total Bilirubin: 0.5 mg/dL (ref 0.2–1.2)
Total Protein: 6.6 g/dL (ref 6.1–8.1)

## 2023-05-16 LAB — CBC WITH DIFFERENTIAL/PLATELET
Absolute Lymphocytes: 1718 {cells}/uL (ref 850–3900)
Absolute Monocytes: 788 {cells}/uL (ref 200–950)
Basophils Absolute: 43 {cells}/uL (ref 0–200)
Basophils Relative: 0.6 %
Eosinophils Absolute: 0 {cells}/uL — ABNORMAL LOW (ref 15–500)
Eosinophils Relative: 0 %
HCT: 45.3 % (ref 38.5–50.0)
Hemoglobin: 14.9 g/dL (ref 13.2–17.1)
MCH: 30.5 pg (ref 27.0–33.0)
MCHC: 32.9 g/dL (ref 32.0–36.0)
MCV: 92.8 fL (ref 80.0–100.0)
MPV: 10.3 fL (ref 7.5–12.5)
Monocytes Relative: 11.1 %
Neutro Abs: 4551 {cells}/uL (ref 1500–7800)
Neutrophils Relative %: 64.1 %
Platelets: 180 10*3/uL (ref 140–400)
RBC: 4.88 10*6/uL (ref 4.20–5.80)
RDW: 13.4 % (ref 11.0–15.0)
Total Lymphocyte: 24.2 %
WBC: 7.1 10*3/uL (ref 3.8–10.8)

## 2023-05-16 LAB — LIPASE: Lipase: 67 U/L — ABNORMAL HIGH (ref 7–60)

## 2023-05-16 MED ORDER — ONDANSETRON HCL 4 MG PO TABS: 4.0000 mg | ORAL_TABLET | Freq: Three times a day (TID) | ORAL | 0 refills | Status: DC | PRN

## 2023-05-16 MED ORDER — AMOXICILLIN-POT CLAVULANATE 875-125 MG PO TABS: 1.0000 | ORAL_TABLET | Freq: Two times a day (BID) | ORAL | 0 refills | Status: DC

## 2023-05-16 NOTE — Progress Notes (Signed)
 Subjective:    Patient ID: Robert Lang, male    DOB: 01-20-1951, 73 y.o.   MRN: [id number]  HPI  Discussed the use of AI scribe software for clinical note transcription with the patient, who gave verbal consent to proceed.  History of Present Illness   Robert Lang is a 73 year old male with a history of bilateral inguinal hernia repair, ventral hernia repair, and diverticulosis who presents with abdominal pain and bloating.   He has been experiencing a  asymmetric bulging sensation in the abdomen, primarily on the left side, for about a week before visiting the emergency department on May 09, 2023. The area is very sensitive, and he describes a bloated sensation. He initially suspected a hernia. A CT scan performed in the emergency department showed no acute findings. He has a history of bilateral inguinal hernia repairs and ventral hernia repair. The recent CT scan noted diverticulosis, though it was not acutely inflamed.  He reports intermittent nausea but no vomiting. His bowel movements are regular, occurring daily, with the last one being the day before this visit. Since his gallbladder surgery, he sometimes needs to exert more effort during bowel movements, but they remain normal in consistency. No recent constipation. No fever, chills, or sweats.  He is currently taking Ambien for insomnia and Ativan as needed for anxiety, typically at night. His wife manages his medication administration. He has been on Ativan for years and finds it effective for calming him when upset, as advised by a psychiatrist.               Review of Systems  Constitutional:  Negative for chills, fatigue and fever.  HENT:  Negative for congestion, ear discharge and ear pain.   Respiratory:  Negative for cough, chest tightness, shortness of breath and wheezing.   Cardiovascular:  Negative for chest pain and palpitations.  Gastrointestinal:  Positive for abdominal pain. Negative for  abdominal distention, blood in stool, diarrhea and nausea.  Genitourinary:  Negative for dysuria, frequency, hematuria and testicular pain.  Musculoskeletal:  Negative for back pain, joint swelling and myalgias.  Skin:  Negative for rash.  Neurological:  Negative for dizziness, seizures, weakness and headaches.  Hematological:  Negative for adenopathy. Does not bruise/bleed easily.  Psychiatric/Behavioral:  Negative for behavioral problems, decreased concentration, sleep disturbance and suicidal ideas. The patient is nervous/anxious.     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 M75.00)   Allergic rhinitis due to pollen 09/03/2013   J30.1)  Formatting of this note might be different from the original. J30.1)   Allergy    Anticoagulated 07/01/2016   Anxiety    Arthritis    Atrial fibrillation (HCC)    Atrial flutter (HCC) 06/26/2015   CAD (coronary artery disease)    Chest pain 06/26/2015   COPD (chronic obstructive pulmonary disease) (HCC)    Coronary artery disease 07/06/2018   Cardiac catheterization 2017 showing 90% small diagonal branch disease   DDD (degenerative disc disease), cervical 09/03/2013   M50.90)  Formatting of this note might be different from the original. M50.90)   Depression    Dizziness 10/28/2017   Dyslipidemia, goal LDL below 70 07/06/2018   Dyspnea on exertion 10/20/2018   Dysrhythmia    Essential hypertension 07/06/2018   ETOH abuse 01/11/2019  6 pack of beer per day   Falls 10/28/2017   GERD (gastroesophageal reflux disease)    H/O amiodarone therapy 07/01/2016   Heart palpitations 01/27/2017   History of colon polyps    History of kidney stones    Hyperlipidemia    Hypertension    IPF (idiopathic pulmonary fibrosis) (HCC)    Neck pain  09/03/2013   Neuropathy 07/06/2018   Obstructive sleep apnea 08/05/2015   PLMD (periodic limb movement disorder) 11/04/2015   Polycythemia, 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 STORY: Due to alcohol/ tobacco   Pure hypercholesterolemia 09/03/2013   E78.0)  Formatting of this note might be different from the original. 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/06/2018   2017     Social History   Socioeconomic History   Marital status: Married    Spouse name: Not on file   Number of children: Not on file   Years of education: Not on file   Highest education level: Not on file  Occupational History   Not on file  Tobacco Use   Smoking status: Former    Current packs/day: 0.00    Average packs/day: 1 pack/day for 30.0 years (30.0 ttl pk-yrs)    Types: Cigarettes    Start date: 01/09/1989    Quit date: 01/10/2019    Years since quitting: 4.3   Smokeless tobacco: Former  Building services engineer status: Never Used  Substance and Sexual Activity   Alcohol use: Not Currently    Comment: 8oz of wine a day   Drug use: Never   Sexual activity: Not on file  Other Topics Concern   Not on file  Social History Narrative   Not on file   Social Drivers of Health   Financial Resource Strain: Low Risk  (12/30/2022)   Overall Financial Resource Strain (CARDIA)    Difficulty of Paying Living Expenses: Not hard at all  Food Insecurity: No Food Insecurity (12/30/2022)   Hunger Vital Sign    Worried About Running Out of Food in the Last Year: Never true    Ran Out of Food in the Last Year: Never true  Transportation Needs: No Transportation Needs (12/30/2022)   PRAPARE - Administrator, Civil Service (Medical): No    Lack of Transportation (Non-Medical): No  Physical Activity: Insufficiently Active (12/30/2022)   Exercise Vital Sign    Days of  Exercise per Week: 3 days    Minutes of Exercise per Session: 30 min  Stress: No Stress Concern Present (12/30/2022)   [employer] of Occupational Health - Occupational Stress Questionnaire    Feeling of Stress : Not at all  Social Connections: Socially Integrated (12/30/2022)   Social Connection and Isolation Panel [NHANES]    Frequency of Communication with Friends and Family: More than three times a week    Frequency of Social Gatherings with Friends and Family: More than three times a week    Attends Religious Services: More than 4 times per year    Active Member of [employer] or Organizations: Yes    Attends Banker Meetings: Never    Marital Status: Married  Catering manager Violence: Not At Risk (12/30/2022)   Humiliation, Afraid, Rape, and Kick questionnaire    Fear of Current or Ex-Partner: No    Emotionally Abused: No    Physically  Abused: No    Sexually Abused: No    Past Surgical History:  Procedure Laterality Date   ATRIAL FIBRILLATION ABLATION  10/2015   BACK SURGERY     CARDIOVERSION  2017   CATARACT EXTRACTION Bilateral 2017   March and April 2017   CHOLECYSTECTOMY N/A 12/21/2022   Procedure: LAPAROSCOPIC CHOLECYSTECTOMY AND REMOVAL OF BILIARY DRAIN;  Surgeon: Sheliah Hatch De Blanch, MD;  Location: [facility] ORS;  Service: General;  Laterality: N/A;   COLONOSCOPY  2018   CORONARY STENT PLACEMENT  2012   CORONARY/GRAFT ACUTE MI REVASCULARIZATION N/A 01/10/2019   Procedure: CORONARY/GRAFT ACUTE MI REVASCULARIZATION;  Surgeon: Marykay Lex, MD;  Location: [facility] INVASIVE CV LAB;  Service: Cardiovascular;  Laterality: N/A;   INGUINAL HERNIA REPAIR     over 20 years ago   IR CHOLANGIOGRAM EXISTING TUBE  09/16/2022   IR EXCHANGE BILIARY DRAIN  10/06/2022   IR PERC CHOLECYSTOSTOMY  08/30/2022   LAPAROSCOPIC APPENDECTOMY N/A 01/17/2021   Procedure: APPENDECTOMY LAPAROSCOPIC;  Surgeon: Quentin Ore, MD;  Location: [facility] OR;  Service: General;  Laterality: N/A;    LEFT HEART CATH AND CORONARY ANGIOGRAPHY N/A 01/10/2019   Procedure: LEFT HEART CATH AND CORONARY ANGIOGRAPHY;  Surgeon: Marykay Lex, MD;  [facility] INV[facility] CV LAB;  Service: Cardiovascular;  Laterality: N/A;   LUMBAR LAMINECTOMY Bilateral 04/05/2016   L2-L5    VENTRAL HERNIA REPAIR  2018    Family History  Problem Relation Age of Onset   Anxiety disorder Mother    Alcohol abuse Father    Colon cancer Father    Depression Brother    Alcohol abuse Brother    Throat cancer Brother     Allergies  Allergen Reactions   Naproxen Other (See Comments)    (Naprosyn *ANALGESICS - ANTI-INFLAMMATORY*) Nausea, Abdominal pain   Rapaflo [Silodosin] Other (See Comments)    Low blood pressure    Current Outpatient Medications on File Prior to Visit  Medication Sig Dispense Refill   acetaminophen (TYLENOL) 500 MG tablet Take 1 tablet (500 mg total) by mouth every 8 (eight) hours as needed for moderate pain. 100 tablet 0   apixaban (ELIQUIS) 5 MG TABS tablet TAKE 1 TABLET(5 MG) BY MOUTH TWICE DAILY 60 tablet 5   atorvastatin (LIPITOR) 80 MG tablet TAKE 1 TABLET(80 MG) BY MOUTH DAILY (Patient taking differently: Take 80 mg by mouth daily.) 90 tablet 2   buPROPion (WELLBUTRIN XL) 150 MG 24 hr tablet Take 1 tablet (150 mg total) by mouth daily. 30 tablet 3   buPROPion (WELLBUTRIN XL) 300 MG 24 hr tablet TAKE 1 TABLET(300 MG) BY MOUTH DAILY 30 tablet 0   busPIRone (BUSPAR) 15 MG tablet Take 1 tablet (15 mg total) by mouth 3 (three) times daily. 90 tablet 3   Cholecalciferol (VITAMIN D3) 50 MCG (2000 UT) TABS Take 4,000 Units by mouth 2 (two) times daily.     docusate sodium (COLACE) 100 MG capsule Take 100 mg by mouth daily.     famotidine (PEPCID) 40 MG tablet TAKE 1 TABLET(40 MG) BY MOUTH AT BEDTIME (Patient taking differently: Take 40 mg by mouth at bedtime.) 90 tablet 2   fexofenadine (ALLEGRA) 180 MG tablet Take 180 mg by mouth at bedtime.      finasteride (PROSCAR) 5 MG tablet Take 1  tablet (5 mg total) by mouth daily. (Patient taking differently: Take 5 mg by mouth at bedtime.) 90 tablet 1   lamoTRIgine (LAMICTAL) 100 MG tablet Take 1 tablet (100 mg  total) by mouth daily. 90 tablet 1   levalbuterol (XOPENEX) 1.25 MG/0.5ML nebulizer solution Take 1.25 mg by nebulization every 4 (four) hours as needed for wheezing or shortness of breath. 1 each 12   LORazepam (ATIVAN) 0.5 MG tablet Take one tablet by mouth only for severe anxiety or agitation (Patient taking differently: Take 0.5 mg by mouth daily as needed for anxiety. Take one tablet by mouth only for severe anxiety or agitation. Has not taking medication yet.) 30 tablet 1   Melatonin 10 MG TABS Take 10 mg by mouth at bedtime.     MUCINEX 600 MG 12 hr tablet TAKE 2 TABLETS BY MOUTH TWICE DAILY 120 tablet 0   Multiple Vitamins-Minerals (PRESERVISION AREDS PO) Take 1 capsule by mouth in the morning and at bedtime.     nitroGLYCERIN (NITROSTAT) 0.4 MG SL tablet Place 1 tablet (0.4 mg total) under the tongue every 5 (five) minutes as needed for chest pain. 25 tablet 3   ondansetron (ZOFRAN) 4 MG tablet Take 1 tablet (4 mg total) by mouth every 8 (eight) hours as needed for nausea or vomiting. 20 tablet 0   ondansetron (ZOFRAN) 4 MG tablet Take 1 tablet (4 mg total) by mouth every 8 (eight) hours as needed for nausea or vomiting. 20 tablet 0   ondansetron (ZOFRAN-ODT) 4 MG disintegrating tablet 4mg  ODT q4 hours prn nausea/vomit 10 tablet 0   oxyCODONE (ROXICODONE) 5 MG immediate release tablet Take 1 tablet (5 mg total) by mouth every 4 (four) hours as needed for severe pain (pain score 7-10). 10 tablet 0   OXYGEN Inhale 2 L into the lungs daily.     pantoprazole (PROTONIX) 40 MG tablet Take 1 tablet (40 mg total) by mouth daily. Please call [phone] to schedule an office visit for more refills 90 tablet 1   Pirfenidone (ESBRIET) 801 MG TABS Take 1 tablet (801 mg total) by mouth 3 (three) times daily. 270 tablet 1   polyethylene  glycol powder (GLYCOLAX/MIRALAX) 17 GM/SCOOP powder Take 17 g by mouth daily.     pregabalin (LYRICA) 150 MG capsule TAKE 1 CAPSULE(150 MG) BY MOUTH TWICE DAILY 90 capsule 2   ranolazine (RANEXA) 1000 MG SR tablet Take 1 tablet (1,000 mg total) by mouth 2 (two) times daily. 180 tablet 2   sertraline (ZOLOFT) 100 MG tablet Take 2 tablets (200 mg total) by mouth at bedtime. 180 tablet 1   Sodium Chloride Flush (NORMAL SALINE FLUSH) 0.9 % SOLN Flush drain with 5 mLs daily. Discard remaining 5ml in each syringe. 200 mL 0   traZODone (DESYREL) 50 MG tablet Take 1 tablet (50 mg total) by mouth at bedtime. 90 tablet 1   vitamin B-12 (CYANOCOBALAMIN) 1000 MCG tablet Take 1,000 mcg by mouth daily.     zolpidem (AMBIEN) 5 MG tablet TAKE 1 TABLET(5 MG) BY MOUTH AT BEDTIME AS NEEDED FOR SLEEP 30 tablet 1   Current Facility-Administered Medications on File Prior to Visit  Medication Dose Route Frequency Provider Last Rate Last Admin   regadenoson (LEXISCAN) injection SOLN 0.4 mg  0.4 mg Intravenous Once Tobb, Kardie, DO       technetium tetrofosmin (TC-MYOVIEW) injection 31.7 millicurie  31.7 millicurie Intravenous Once PRN Tobb, Kardie, DO        BP 110/62   Pulse 95   Resp 18   Ht 5\' 11"  (1.803 m)   Wt 209 lb 14.4 oz (95.2 kg)   SpO2 95%   BMI 29.28 kg/m  Objective:   Physical Exam  General Mental Status- Alert. General Appearance- Not in acute distress.   Skin General: Color- Normal Color. Moisture- Normal Moisture.  Neck Carotid Arteries- Normal color. Moisture- Normal Moisture. No carotid bruits. No JVD.  Chest and Lung Exam Auscultation: Breath Sounds:- CTA  Cardiovascular Auscultation:Rythm- RRR Murmurs & Other Heart Sounds:Auscultation of the heart reveals- No Murmurs.  Abdomen Inspection:-Inspeection Normal. Palpation/Percussion:Note:No mass. Palpation and Percussion of the abdomen reveal- Mild-moderate Tender left of umbilicus, Non Distended + BS, no rebound or  guarding.(No bulging/hernia on abdomen exam)   Neurologic Cranial Nerve exam:- CN III-XII intact(No nystagmus), symmetric smile. Strength:- 5/5 equal and symmetric strength both upper and lower extremities.       Assessment & Plan:   Patient Instructions  Abdominal Pain Left lower quadrant pain with a history of bilateral inguinal hernia repair and ventral hernia repair. Recent CT scan showed no acute abnormality but did show diverticulosis. No current signs of bowel obstruction or constipation. -Start Augmentin for possible early diverticulitis. -If pain worsens or remains the same, order another CT abdomen and pelvis. -Check CBC, metabolic panel, and lipase protein today.  Nausea Intermittent nausea, currently has Zofran at home. -If nausea worsens, patient has Zofran available.  Insomnia/Anxiety Currently taking Ativan and Ambien, but not consistently. Concerns about potential over-sedation if both are taken together. -Clarify with patient and wife that Ativan and Ambien should not be taken together.(want wife to review my chart since she manages the meds) -Consider refilling Ambien if needed.  Follow-up in 7-10 days or sooner depending on lab results.   [name], [name]   Time spent with patient today was  40 minutes which consisted of chart review, discussing diagnosis, work up treatment and documentation.

## 2023-05-16 NOTE — Patient Instructions (Signed)
 Abdominal Pain Left lower quadrant pain with a history of bilateral inguinal hernia repair and ventral hernia repair. Recent CT scan showed no acute abnormality but did show diverticulosis. No current signs of bowel obstruction or constipation. -Start Augmentin for possible early diverticulitis. -If pain worsens or remains the same, order another CT abdomen and pelvis. -Check CBC, metabolic panel, and lipase protein today.  Nausea Intermittent nausea, currently has Zofran at home. -If nausea worsens, patient has Zofran available.  Insomnia/Anxiety Currently taking Ativan and Ambien, but not consistently. Concerns about potential over-sedation if both are taken together. -Clarify with patient and wife that Ativan and Ambien should not be taken together.(want wife to review my chart since she manages the meds) -Consider refilling Ambien if needed.  Follow-up in 7-10 days or sooner depending on lab results.

## 2023-05-16 NOTE — Progress Notes (Signed)
 Complex Care Management Note  Care Guide Note 05/16/2023 Name: Ossiel Marchio MRN: [id number] DOB: [birth year]  Cleo Villamizar is a 73 y.o. year old male who sees [name], [name], [place] for primary care. I reached out to [name] by phone today to offer complex care management services.  Mr. Hitz was given information about Complex Care Management services today including:   The Complex Care Management services include support from the care team which includes your Nurse Care Manager, Clinical Social Worker, or Pharmacist.  The Complex Care Management team is here to help remove barriers to the health concerns and goals most important to you. Complex Care Management services are voluntary, and the patient may decline or stop services at any time by request to their care team member.   Complex Care Management Consent Status: Patient did not agree to participate in complex care management services at this time.   Encounter Outcome:  Patient Refused  Burman Nieves, CMA, Care Guide [facility]  [facility] Guide Direct Dial: [phone]  Fax: [phone] Website: [email or web address]

## 2023-05-17 ENCOUNTER — Encounter: Payer: Self-pay | Admitting: Medical

## 2023-05-19 ENCOUNTER — Ambulatory Visit: Payer: Medicare Other | Admitting: Physician Assistant

## 2023-05-19 ENCOUNTER — Encounter: Payer: Self-pay | Admitting: Physician Assistant

## 2023-05-19 DIAGNOSIS — Z87891 Personal history of nicotine dependence: Secondary | ICD-10-CM | POA: Diagnosis not present

## 2023-05-19 NOTE — Patient Instructions (Signed)

## 2023-05-19 NOTE — Progress Notes (Signed)
 Virtual Visit via Telephone Note  I connected with Robert Lang on 05/19/23 at 9:53 AM  by telephone and verified that I am speaking with the correct person using two identifiers.  Location: Patient: home Provider: working virtually from home   I discussed the limitations, risks, security and privacy concerns of performing an evaluation and management service by telephone and the availability of in person appointments. I also discussed with the patient that there may be a patient responsible charge related to this service. The patient expressed understanding and agreed to proceed.       Shared Decision Making Visit Lung Cancer Screening Program [phone])   Eligibility: Age 73 Pack Years Smoking History Calculation 1 (# packs/per year x # years smoked) Recent History of coughing up blood  no Unexplained weight loss? No ( >Than 15 pounds within the last 6 months ) Prior History Lung / other cancer No (Diagnosis within the last 5 years already requiring surveillance chest CT Scans). Smoking Status Former Smoker Former Smokers: Years since quit: 5 years  Quit Date: 2020  Visit Components: Discussion included one or more decision making aids? Yes Discussion included risk/benefits of screening. Yes Discussion included potential follow up diagnostic testing for abnormal scans. Yes Discussion included meaning and risk of over diagnosis. Yes Discussion included meaning and risk of False Positives. Yes Discussion included meaning of total radiation exposure. Yes  Counseling Included: Importance of adherence to annual lung cancer LDCT screening. Yes Impact of comorbidities on ability to participate in the program. Yes Ability and willingness to under diagnostic treatment. Yes  Smoking Cessation Counseling: Former Smokers:  Discussed the importance of maintaining cigarette abstinence. Yes Diagnosis Code: Personal History of Nicotine Dependence. J47.829 Information about  tobacco cessation classes and interventions provided to patient. Yes Written Order for Lung Cancer Screening with LDCT placed in Epic. Yes (CT Chest Lung Cancer Screening Low Dose W/O CM) [id number] Z12.2-Screening of respiratory organs Z87.891-Personal history of nicotine dependence    I spent 25 minutes of face to face time/virtual visit time  with the patient discussing the risks and benefits of lung cancer screening. We took the time to pause at intervals to allow for questions to be asked and answered to ensure understanding. We discussed that they had taken the single most powerful action possible to decrease their risk of developing lung cancer when they quit smoking. I counseled them to remain smoke free, and to contact the office if they ever had the desire to smoke again so that we can provide resources and tools to help support the effort to remain smoke free. We discussed the time and location of the scan, and they  will receive a call or letter with the results within  24-72 hours of receiving them. They have the office contact information in the event they have questions.   They verbalized understanding of all of the above and had no further questions.    I explained to the patient that there has been a high incidence of coronary artery disease noted on these exams. I explained that this is a non-gated exam therefore degree or severity cannot be determined. This patient is on statin therapy. I have asked the patient to follow-up with their PCP regarding any incidental finding of coronary artery disease and management with diet or medication as they feel is clinically indicated. The patient verbalized understanding of the above and had no further questions.      [name] [name], [name]

## 2023-05-20 ENCOUNTER — Other Ambulatory Visit: Payer: Self-pay | Admitting: Medical

## 2023-05-20 ENCOUNTER — Ambulatory Visit (HOSPITAL_BASED_OUTPATIENT_CLINIC_OR_DEPARTMENT_OTHER)
Admission: RE | Admit: 2023-05-20 | Discharge: 2023-05-20 | Disposition: A | Discharge status: Home / Self Care | Payer: Medicare Other | Source: Ambulatory Visit | Attending: Acute Care | Admitting: Acute Care

## 2023-05-20 DIAGNOSIS — Z122 Encounter for screening for malignant neoplasm of respiratory organs: Secondary | ICD-10-CM | POA: Diagnosis not present

## 2023-05-20 DIAGNOSIS — Z87891 Personal history of nicotine dependence: Secondary | ICD-10-CM | POA: Diagnosis not present

## 2023-05-26 ENCOUNTER — Telehealth: Payer: Self-pay

## 2023-05-26 NOTE — Telephone Encounter (Signed)
 Copied from CRM [id number]. Topic: Clinical - Prescription Issue >> May 26, 2023 12:23 PM [place] A wrote: Reason for CRM: Patient's wife called and wanted to know patient should continue to take famotidine (PEPCID) 40 MG tablet; Mrs.Velaquez said he takes 20mg  since he was in the hospital last summer not 40mg -please call

## 2023-05-27 MED ORDER — FAMOTIDINE 40 MG PO TABS: 40.0000 mg | ORAL_TABLET | Freq: Every day | ORAL | 3 refills | Status: AC

## 2023-05-27 NOTE — Telephone Encounter (Signed)
 yes

## 2023-05-27 NOTE — Telephone Encounter (Signed)
 Rx famotadine  40 mg sent to pharmacy.

## 2023-05-27 NOTE — Addendum Note (Signed)
 Addended by: Gwenevere Abbot on: 05/27/2023 05:53 PM   Modules accepted: Orders

## 2023-05-27 NOTE — Telephone Encounter (Signed)
 Pt stated the 40 mg is working better

## 2023-05-30 ENCOUNTER — Ambulatory Visit (HOSPITAL_BASED_OUTPATIENT_CLINIC_OR_DEPARTMENT_OTHER)
Admission: RE | Admit: 2023-05-30 | Discharge: 2023-05-30 | Disposition: A | Discharge status: Home / Self Care | Source: Ambulatory Visit | Attending: Medical | Admitting: Medical

## 2023-05-30 ENCOUNTER — Encounter: Payer: Self-pay | Admitting: Medical

## 2023-05-30 ENCOUNTER — Ambulatory Visit (INDEPENDENT_AMBULATORY_CARE_PROVIDER_SITE_OTHER): Payer: Medicare Other | Admitting: Medical

## 2023-05-30 VITALS — BP 134/64 | HR 88 | Resp 18 | Ht 71.0 in | Wt 209.2 lb

## 2023-05-30 DIAGNOSIS — R1033 Periumbilical pain: Secondary | ICD-10-CM

## 2023-05-30 DIAGNOSIS — K5730 Diverticulosis of large intestine without perforation or abscess without bleeding: Secondary | ICD-10-CM | POA: Diagnosis not present

## 2023-05-30 DIAGNOSIS — R1032 Left lower quadrant pain: Secondary | ICD-10-CM

## 2023-05-30 DIAGNOSIS — K429 Umbilical hernia without obstruction or gangrene: Secondary | ICD-10-CM | POA: Diagnosis not present

## 2023-05-30 DIAGNOSIS — K769 Liver disease, unspecified: Secondary | ICD-10-CM | POA: Diagnosis not present

## 2023-05-30 LAB — CBC WITH DIFFERENTIAL/PLATELET
Basophils Absolute: 0 10*3/uL (ref 0.0–0.1)
Basophils Relative: 0.5 % (ref 0.0–3.0)
Eosinophils Absolute: 0.1 10*3/uL (ref 0.0–0.7)
Eosinophils Relative: 1.7 % (ref 0.0–5.0)
HCT: 45.4 % (ref 39.0–52.0)
Hemoglobin: 14.8 g/dL (ref 13.0–17.0)
Lymphocytes Relative: 17.9 % (ref 12.0–46.0)
Lymphs Abs: 1.5 10*3/uL (ref 0.7–4.0)
MCHC: 32.6 g/dL (ref 30.0–36.0)
MCV: 94.5 fl (ref 78.0–100.0)
Monocytes Absolute: 1 10*3/uL (ref 0.1–1.0)
Monocytes Relative: 11.3 % (ref 3.0–12.0)
Neutro Abs: 5.8 10*3/uL (ref 1.4–7.7)
Neutrophils Relative %: 68.6 % (ref 43.0–77.0)
Platelets: 226 10*3/uL (ref 150.0–400.0)
RBC: 4.8 Mil/uL (ref 4.22–5.81)
RDW: 15 % (ref 11.5–15.5)
WBC: 8.5 10*3/uL (ref 4.0–10.5)

## 2023-05-30 LAB — COMPREHENSIVE METABOLIC PANEL
ALT: 17 U/L (ref 0–53)
AST: 15 U/L (ref 0–37)
Albumin: 4 g/dL (ref 3.5–5.2)
Alkaline Phosphatase: 71 U/L (ref 39–117)
BUN: 13 mg/dL (ref 6–23)
CO2: 26 meq/L (ref 19–32)
Calcium: 9.5 mg/dL (ref 8.4–10.5)
Chloride: 102 meq/L (ref 96–112)
Creatinine, Ser: 0.79 mg/dL (ref 0.40–1.50)
GFR: 88.34 mL/min (ref >60.00)
Glucose, Bld: 119 mg/dL — ABNORMAL HIGH (ref 70–99)
Potassium: 4.4 meq/L (ref 3.5–5.1)
Sodium: 136 meq/L (ref 135–145)
Total Bilirubin: 0.5 mg/dL (ref 0.2–1.2)
Total Protein: 7.1 g/dL (ref 6.0–8.3)

## 2023-05-30 LAB — LIPASE: Lipase: 70 U/L — ABNORMAL HIGH (ref 11.0–59.0)

## 2023-05-30 MED ORDER — AMOXICILLIN-POT CLAVULANATE 875-125 MG PO TABS: 1.0000 | ORAL_TABLET | Freq: Two times a day (BID) | ORAL | 0 refills | Status: DC

## 2023-05-30 MED ORDER — IOHEXOL 300 MG/ML  SOLN
100.0000 mL | Freq: Once | INTRAMUSCULAR | Status: AC | PRN
  Administered 2023-05-30: 100 mL via INTRAVENOUS

## 2023-05-30 NOTE — Progress Notes (Signed)
 Patient placed on 2 liters of oxygen to conserve his personal oxygen supply while in our care to make sure he has sufficient oxygen to return home

## 2023-05-30 NOTE — Patient Instructions (Addendum)
 Abdominal Pain(llq pain) Persistent left lower quadrant pain with moderate distal colonic diverticulosis. Previous CT showed no acute inflammation. Pain slightly improved with Augmentin but worsened post-treatment. Differential includes diverticulitis, adhesions, or early hernia. Further evaluation required. - Order CBC, metabolic panel, and lipase. - Order CT scan of abdomen and pelvis. - Prescribe Augmentin for 10 days. - Refer to Dr. Chales Abrahams for possible sigmoidoscopy. - Notify Dr. Chales Abrahams of progress. - Refer to general surgeon for possible early hernia evaluation.  Follow-up Follow-up contingent on lab and imaging results. Coordination with specialists as needed. - Update on lab and imaging results. - Schedule follow-up appointment based on results.

## 2023-05-30 NOTE — Progress Notes (Signed)
 Subjective:    Patient ID: Robert Lang, male    DOB: 02-11-1951, 73 y.o.   MRN: [id number]  HPI  He presents with persistent abdominal pain.  He has been experiencing persistent abdominal pain primarily in the left lower quadrant, radiating from the navel area, for approximately a month. The pain varies in severity, with some days being mild and others, like last night, being severe, described as feeling like 'somebody jamming the knife in.'  He was previously seen on February 24th and was prescribed Augmentin. While on the antibiotic, he noted some improvement, with some days being almost pain-free, but the pain never completely resolved. After completing the course of Augmentin around the middle of last week, he feels the pain has worsened.  A CT scan performed on February 17th showed moderate distal colonic diverticulosis without acute inflammatory changes. Blood work at that time did not show elevated infection-fighting cells. He was initially seen in the emergency department for this issue, where imaging studies showed no acute findings, and he was not given antibiotics at that time.  He mentions a history of abdominal surgeries, which could contribute to his symptoms. He experiences pain directly over the belly button and at the trocar site from previous surgery.  No diarrhea. He reports having two good bowel movements yesterday.    Review of Systems  Constitutional:  Negative for chills, fatigue and fever.  Respiratory:  Negative for cough, chest tightness, shortness of breath and wheezing.   Cardiovascular:  Negative for chest pain and palpitations.  Gastrointestinal:  Positive for abdominal pain. Negative for constipation, diarrhea, nausea and vomiting.       No constipation  Musculoskeletal:  Negative for back pain, joint swelling and myalgias.  Skin:  Negative for pallor and rash.  Neurological:  Negative for dizziness, syncope, weakness, numbness and headaches.   Hematological:  Negative for adenopathy. Does not bruise/bleed easily.  Psychiatric/Behavioral:  Negative for behavioral problems and decreased concentration.     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 M75.00)   Allergic rhinitis due to pollen 09/03/2013   J30.1)  Formatting of this note might be different from the original. J30.1)   Allergy    Anticoagulated 07/01/2016   Anxiety    Arthritis    Atrial fibrillation (HCC)    Atrial flutter (HCC) 06/26/2015   CAD (coronary artery disease)    Chest pain 06/26/2015   COPD (chronic obstructive pulmonary disease) (HCC)    Coronary artery disease 07/06/2018   Cardiac catheterization 2017 showing 90% small diagonal branch disease   DDD (degenerative disc disease), cervical 09/03/2013   M50.90)  Formatting of this note might be different from the original. M50.90)   Depression    Dizziness 10/28/2017   Dyslipidemia, goal LDL below 70 07/06/2018   Dyspnea on exertion 10/20/2018   Dysrhythmia    Essential hypertension 07/06/2018   ETOH abuse 01/11/2019   6 pack of beer per day   Falls 10/28/2017   GERD (gastroesophageal reflux disease)    H/O amiodarone therapy 07/01/2016   Heart palpitations 01/27/2017   History of colon polyps    History of kidney stones    Hyperlipidemia    Hypertension    IPF (idiopathic pulmonary fibrosis) (HCC)    Neck pain 09/03/2013   Neuropathy 07/06/2018  Obstructive sleep apnea 08/05/2015   PLMD (periodic limb movement disorder) 11/04/2015   Polycythemia, 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 STORY: Due to alcohol/ tobacco   Pure hypercholesterolemia 09/03/2013   E78.0)  Formatting of this note might be different from  the original. 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/06/2018   2017     Social History   Socioeconomic History   Marital status: Married    Spouse name: Not on file   Number of children: Not on file   Years of education: Not on file   Highest education level: Not on file  Occupational History   Not on file  Tobacco Use   Smoking status: Former    Current packs/day: 0.00    Average packs/day: 1 pack/day for 30.0 years (30.0 ttl pk-yrs)    Types: Cigarettes    Start date: 01/09/1989    Quit date: 01/10/2019    Years since quitting: 4.3   Smokeless tobacco: Former  Building services engineer status: Never Used  Substance and Sexual Activity   Alcohol use: Not Currently    Comment: 8oz of wine a day   Drug use: Never   Sexual activity: Not on file  Other Topics Concern   Not on file  Social History Narrative   Not on file   Social Drivers of Health   Financial Resource Strain: Low Risk  (12/30/2022)   Overall Financial Resource Strain (CARDIA)    Difficulty of Paying Living Expenses: Not hard at all  Food Insecurity: No Food Insecurity (12/30/2022)   Hunger Vital Sign    Worried About Running Out of Food in the Last Year: Never true    Ran Out of Food in the Last Year: Never true  Transportation Needs: No Transportation Needs (12/30/2022)   PRAPARE - Administrator, Civil Service (Medical): No    Lack of Transportation (Non-Medical): No  Physical Activity: Insufficiently Active (12/30/2022)   Exercise Vital Sign    Days of Exercise per Week: 3 days    Minutes of Exercise per Session: 30 min  Stress: No Stress Concern Present (12/30/2022)   [employer] of Occupational Health - Occupational Stress Questionnaire    Feeling of Stress : Not at all  Social Connections: Socially Integrated (12/30/2022)   Social Connection and Isolation Panel [NHANES]    Frequency of Communication with Friends and Family:  More than three times a week    Frequency of Social Gatherings with Friends and Family: More than three times a week    Attends Religious Services: More than 4 times per year    Active Member of [employer] or Organizations: Yes    Attends Banker Meetings: Never    Marital Status: Married  Catering manager Violence: Not At Risk (12/30/2022)   Humiliation, Afraid, Rape, and Kick questionnaire    Fear of Current or Ex-Partner: No    Emotionally Abused: No    Physically Abused: No    Sexually Abused: No    Past Surgical History:  Procedure Laterality Date   ATRIAL FIBRILLATION ABLATION  10/2015   BACK SURGERY     CARDIOVERSION  2017   CATARACT EXTRACTION Bilateral 2017   March and April 2017   CHOLECYSTECTOMY N/A 12/21/2022   Procedure: LAPAROSCOPIC CHOLECYSTECTOMY AND REMOVAL OF BILIARY DRAIN;  Surgeon: Rodman Pickle, MD;  Location: [facility] ORS;  Service: General;  Laterality: N/A;   COLONOSCOPY  2018   CORONARY STENT PLACEMENT  2012   CORONARY/GRAFT ACUTE MI REVASCULARIZATION N/A 01/10/2019   Procedure: CORONARY/GRAFT ACUTE MI REVASCULARIZATION;  Surgeon: Marykay Lex, MD;  Location[facility] INVASIVE CV LAB;  Service: Cardiovascular;  Laterality: N/A;   INGUINAL HERNIA REPAIR     over 20 years ago   IR CHOLANGIOGRAM EXISTING TUBE  09/16/2022   IR EXCHANGE BILIARY DRAIN  10/06/2022   IR PERC CHOLECYSTOSTOMY  08/30/2022   LAPAROSCOPIC APPENDECTOMY N/A 01/17/2021   Procedure: APPENDECTOMY LAPAROSCOPIC;  Surgeon: Quentin Ore, MD;  Location: [facility] OR;  Service: General;  Laterality: N/A;   LEFT HEART CATH AND CORONARY ANGIOGRAPHY N/A 01/10/2019   Procedure: LEFT HEART CATH AND CORONARY ANGIOGRAPHY;  Surgeon: Marykay Lex, MD;  Location: [facility] INVASIVE CV LAB;  Service: Cardiovascular;  Laterality: N/A;   LUMBAR LAMINECTOMY Bilateral 04/05/2016   L2-L5    VENTRAL HERNIA REPAIR  2018    Family History  Problem Relation Age of Onset   Anxiety disorder Mother    Alcohol  abuse Father    Colon cancer Father    Depression Brother    Alcohol abuse Brother    Throat cancer Brother     Allergies  Allergen Reactions   Naproxen Other (See Comments)    (Naprosyn *ANALGESICS - ANTI-INFLAMMATORY*) Nausea, Abdominal pain   Rapaflo [Silodosin] Other (See Comments)    Low blood pressure    Current Outpatient Medications on File Prior to Visit  Medication Sig Dispense Refill   acetaminophen (TYLENOL) 500 MG tablet Take 1 tablet (500 mg total) by mouth every 8 (eight) hours as needed for moderate pain. 100 tablet 0   amoxicillin-clavulanate (AUGMENTIN) 875-125 MG tablet Take 1 tablet by mouth 2 (two) times daily. 20 tablet 0   apixaban (ELIQUIS) 5 MG TABS tablet TAKE 1 TABLET(5 MG) BY MOUTH TWICE DAILY 60 tablet 5   atorvastatin (LIPITOR) 80 MG tablet TAKE 1 TABLET(80 MG) BY MOUTH DAILY (Patient taking differently: Take 80 mg by mouth daily.) 90 tablet 2   buPROPion (WELLBUTRIN XL) 150 MG 24 hr tablet Take 1 tablet (150 mg total) by mouth daily. 30 tablet 3   buPROPion (WELLBUTRIN XL) 300 MG 24 hr tablet TAKE 1 TABLET(300 MG) BY MOUTH DAILY 30 tablet 0   busPIRone (BUSPAR) 15 MG tablet Take 1 tablet (15 mg total) by mouth 3 (three) times daily. 90 tablet 3   Cholecalciferol (VITAMIN D3) 50 MCG (2000 UT) TABS Take 4,000 Units by mouth 2 (two) times daily.     docusate sodium (COLACE) 100 MG capsule Take 100 mg by mouth daily.     famotidine (PEPCID) 40 MG tablet Take 1 tablet (40 mg total) by mouth daily. 90 tablet 3   fexofenadine (ALLEGRA) 180 MG tablet Take 180 mg by mouth at bedtime.      finasteride (PROSCAR) 5 MG tablet Take 1 tablet (5 mg total) by mouth daily. (Patient taking differently: Take 5 mg by mouth at bedtime.) 90 tablet 1   lamoTRIgine (LAMICTAL) 100 MG tablet Take 1 tablet (100 mg total) by mouth daily. 90 tablet 1   levalbuterol (XOPENEX) 1.25 MG/0.5ML nebulizer solution Take 1.25 mg by nebulization every 4 (four) hours as needed for wheezing or  shortness of breath. 1 each 12   LORazepam (ATIVAN) 0.5 MG tablet Take one tablet by mouth only for severe anxiety or agitation (Patient taking differently: Take 0.5 mg by  mouth daily as needed for anxiety. Take one tablet by mouth only for severe anxiety or agitation. Has not taking medication yet.) 30 tablet 1   Melatonin 10 MG TABS Take 10 mg by mouth at bedtime.     MUCINEX 600 MG 12 hr tablet TAKE 2 TABLETS BY MOUTH TWICE DAILY 120 tablet 0   Multiple Vitamins-Minerals (PRESERVISION AREDS PO) Take 1 capsule by mouth in the morning and at bedtime.     nitroGLYCERIN (NITROSTAT) 0.4 MG SL tablet Place 1 tablet (0.4 mg total) under the tongue every 5 (five) minutes as needed for chest pain. 25 tablet 3   ondansetron (ZOFRAN) 4 MG tablet Take 1 tablet (4 mg total) by mouth every 8 (eight) hours as needed for nausea or vomiting. 20 tablet 0   ondansetron (ZOFRAN) 4 MG tablet Take 1 tablet (4 mg total) by mouth every 8 (eight) hours as needed for nausea or vomiting. 20 tablet 0   ondansetron (ZOFRAN) 4 MG tablet Take 1 tablet (4 mg total) by mouth every 8 (eight) hours as needed for nausea or vomiting. 20 tablet 0   ondansetron (ZOFRAN-ODT) 4 MG disintegrating tablet 4mg  ODT q4 hours prn nausea/vomit 10 tablet 0   oxyCODONE (ROXICODONE) 5 MG immediate release tablet Take 1 tablet (5 mg total) by mouth every 4 (four) hours as needed for severe pain (pain score 7-10). 10 tablet 0   OXYGEN Inhale 2 L into the lungs daily.     pantoprazole (PROTONIX) 40 MG tablet Take 1 tablet (40 mg total) by mouth daily. Please call [phone] to schedule an office visit for more refills 90 tablet 1   Pirfenidone (ESBRIET) 801 MG TABS Take 1 tablet (801 mg total) by mouth 3 (three) times daily. 270 tablet 1   polyethylene glycol powder (GLYCOLAX/MIRALAX) 17 GM/SCOOP powder Take 17 g by mouth daily.     pregabalin (LYRICA) 150 MG capsule TAKE 1 CAPSULE(150 MG) BY MOUTH TWICE DAILY 90 capsule 2   ranolazine (RANEXA) 1000  MG SR tablet Take 1 tablet (1,000 mg total) by mouth 2 (two) times daily. 180 tablet 2   sertraline (ZOLOFT) 100 MG tablet Take 2 tablets (200 mg total) by mouth at bedtime. 180 tablet 1   Sodium Chloride Flush (NORMAL SALINE FLUSH) 0.9 % SOLN Flush drain with 5 mLs daily. Discard remaining 5ml in each syringe. 200 mL 0   traZODone (DESYREL) 50 MG tablet Take 1 tablet (50 mg total) by mouth at bedtime. 90 tablet 1   vitamin B-12 (CYANOCOBALAMIN) 1000 MCG tablet Take 1,000 mcg by mouth daily.     zolpidem (AMBIEN) 5 MG tablet TAKE 1 TABLET(5 MG) BY MOUTH AT BEDTIME AS NEEDED FOR SLEEP 30 tablet 1   Current Facility-Administered Medications on File Prior to Visit  Medication Dose Route Frequency Provider Last Rate Last Admin   regadenoson (LEXISCAN) injection SOLN 0.4 mg  0.4 mg Intravenous Once Tobb, Kardie, DO       technetium tetrofosmin (TC-MYOVIEW) injection 31.7 millicurie  31.7 millicurie Intravenous Once PRN Tobb, Kardie, DO        BP 134/64   Pulse 88   Resp 18   Ht 5\' 11"  (1.803 m)   Wt 209 lb 3.2 oz (94.9 kg)   SpO2 92% Comment: pt baseline 02%. pt on continuous 02.  BMI 29.18 kg/m        Objective:   Physical Exam  General Mental Status- Alert. General Appearance- Not in acute distress.  Skin General: Color- Normal Color. Moisture- Normal Moisture.   Neck Carotid Arteries- Normal color. Moisture- Normal Moisture. No carotid bruits. No JVD.   Chest and Lung Exam Auscultation: Breath Sounds:- CTA   Cardiovascular Auscultation:Rythm- RRR Murmurs & Other Heart Sounds:Auscultation of the heart reveals- No Murmurs.   Abdomen Inspection:-Inspeection Normal. Palpation/Percussion:Note:No mass. Palpation and Percussion of the abdomen reveal- Mild-moderate Tender left of umbilicus and at umbilcus Non Distended + BS, no rebound or guarding.(No bulging/hernia on abdomen exam). Left lower quadrant tender to palpation.     Neurologic Cranial Nerve exam:- CN III-XII  intact(No nystagmus), symmetric smile. Strength:- 5/5 equal and symmetric strength both upper and lower extremities.   Back- no cva tenderness.    Assessment & Plan:   Patient Instructions  Abdominal Pain Persistent left lower quadrant pain with moderate distal colonic diverticulosis. Previous CT showed no acute inflammation. Pain slightly improved with Augmentin but worsened post-treatment. Differential includes diverticulitis, adhesions, or early hernia. Further evaluation required. - Order CBC, metabolic panel, and lipase. - Order CT scan of abdomen and pelvis. - Prescribe Augmentin for 10 days. - Refer to Dr. Chales Abrahams for possible sigmoidoscopy. - Notify Dr. Chales Abrahams of progress. - Refer to general surgeon for possible early hernia evaluation.  Follow-up Follow-up contingent on lab and imaging results. Coordination with specialists as needed. - Update on lab and imaging results. - Schedule follow-up appointment based on results.

## 2023-05-31 ENCOUNTER — Encounter: Payer: Self-pay | Admitting: Medical

## 2023-06-04 ENCOUNTER — Other Ambulatory Visit: Payer: Self-pay | Admitting: Neurology

## 2023-06-06 NOTE — Telephone Encounter (Signed)
 Last seen 10/27/22, next appt 06/22/23  Dispenses   Dispensed Days Supply Quantity Provider Pharmacy  PREGABALIN 150MG  CAPSULES 04/25/2023 45 90 each Windell Norfolk, MD [facility] DR[facility] STORE #...  PREGABALIN 150MG  CAPSULES 03/08/2023 45 90 each Windell Norfolk, MD [facility] STORE #...  PREGABALIN 150MG  CAPSULES 01/19/2023 45 90 each Windell Norfolk, MD [facility] DRUG STORE #...  PREGABALIN 150MG  CAPSULES 12/09/2022 45 90 each Windell Norfolk, MD [facility] DRUG STORE #...  PREGABALIN 150MG  CAPSULES 10/22/2022 45 90 each Windell Norfolk, MD [facility] DRUG STORE #...  PREGABALIN 150MG  CAPSULES 08/28/2022 45 90 each Windell Norfolk, MD [facility] DRUG STORE #...  PREGABALIN 150MG  CAPSULES 07/15/2022 45 90 each Windell Norfolk, MD [facility] DRUG STORE #.Marland KitchenMarland Kitchen

## 2023-06-07 DIAGNOSIS — Z86711 Personal history of pulmonary embolism: Secondary | ICD-10-CM | POA: Diagnosis not present

## 2023-06-07 DIAGNOSIS — H43821 Vitreomacular adhesion, right eye: Secondary | ICD-10-CM | POA: Diagnosis not present

## 2023-06-07 DIAGNOSIS — H31013 Macula scars of posterior pole (postinflammatory) (post-traumatic), bilateral: Secondary | ICD-10-CM | POA: Diagnosis not present

## 2023-06-07 DIAGNOSIS — I10 Essential (primary) hypertension: Secondary | ICD-10-CM | POA: Diagnosis not present

## 2023-06-07 DIAGNOSIS — A419 Sepsis, unspecified organism: Secondary | ICD-10-CM | POA: Diagnosis not present

## 2023-06-07 DIAGNOSIS — H35033 Hypertensive retinopathy, bilateral: Secondary | ICD-10-CM | POA: Diagnosis not present

## 2023-06-07 DIAGNOSIS — I2693 Single subsegmental pulmonary embolism without acute cor pulmonale: Secondary | ICD-10-CM | POA: Diagnosis not present

## 2023-06-07 DIAGNOSIS — H43812 Vitreous degeneration, left eye: Secondary | ICD-10-CM | POA: Diagnosis not present

## 2023-06-09 DIAGNOSIS — I10 Essential (primary) hypertension: Secondary | ICD-10-CM | POA: Diagnosis not present

## 2023-06-09 DIAGNOSIS — A419 Sepsis, unspecified organism: Secondary | ICD-10-CM | POA: Diagnosis not present

## 2023-06-09 DIAGNOSIS — Z86711 Personal history of pulmonary embolism: Secondary | ICD-10-CM | POA: Diagnosis not present

## 2023-06-09 DIAGNOSIS — I2693 Single subsegmental pulmonary embolism without acute cor pulmonale: Secondary | ICD-10-CM | POA: Diagnosis not present

## 2023-06-14 ENCOUNTER — Ambulatory Visit: Payer: Medicare Other | Admitting: Internal Medicine

## 2023-06-14 ENCOUNTER — Other Ambulatory Visit: Payer: Self-pay

## 2023-06-14 ENCOUNTER — Encounter: Payer: Self-pay | Admitting: Internal Medicine

## 2023-06-14 VITALS — BP 110/62 | HR 86 | Temp 97.7°F | Ht 71.0 in | Wt 202.0 lb

## 2023-06-14 DIAGNOSIS — Z122 Encounter for screening for malignant neoplasm of respiratory organs: Secondary | ICD-10-CM

## 2023-06-14 DIAGNOSIS — Z5181 Encounter for therapeutic drug level monitoring: Secondary | ICD-10-CM

## 2023-06-14 DIAGNOSIS — G2581 Restless legs syndrome: Secondary | ICD-10-CM

## 2023-06-14 DIAGNOSIS — I2782 Chronic pulmonary embolism: Secondary | ICD-10-CM

## 2023-06-14 DIAGNOSIS — Z87891 Personal history of nicotine dependence: Secondary | ICD-10-CM

## 2023-06-14 DIAGNOSIS — J84112 Idiopathic pulmonary fibrosis: Secondary | ICD-10-CM | POA: Diagnosis not present

## 2023-06-14 DIAGNOSIS — R748 Abnormal levels of other serum enzymes: Secondary | ICD-10-CM

## 2023-06-14 DIAGNOSIS — R0902 Hypoxemia: Secondary | ICD-10-CM | POA: Diagnosis not present

## 2023-06-14 NOTE — Patient Instructions (Addendum)
 IPF (idiopathic pulmonary fibrosis) (HCC) Medication monitoring encounter  -Pulmonary fibrosis itself appears to be stable since OCt 2023/April 2023 through September 2024 -> though I am concerned you could be worse in March 2025 -Unclear if you are worse March 2025 - Overall tolerating Esbriet well.  - LFT normal July 2024 and March 2025  plan -Continue night oxygen -Continue portable oxygen -Esbriet hold for 1 weeks for lipase elevations [see below]   Chronic intermittent lipase elevation since 2023 [started pirfenidone 2022]  Plan  - Stop pirfenidone for 1 weeks- > re check lipase -> call us for the results -> and then start pirfenidone at 1 tablet 1 time a week followed by 1 tablet 2 times a week and followed by 1 tablet 3 times a week  -Always take meals  -Spaced in 5-6 hours apart  -Apply sunscreen  Pulmonary embolism small June 2024 requirng o2 in hospital  - on 11/25/2022  - using o2 at night and only very mild exercise hypxoemia  Plan  -cotninue eliquis; duration likely 1+ years -> in sept 2025, we will check D-dimer    Acute cholecystisis s/p drain July 2024 S/p thank you cholecystectomy October 2024  Plan   -Expectant follow-up per surgery  Traveled by road to [place] in April 2026  Plan - We can address this sometime in February 2026  Lung cancer screening  Plan - February 2026 we will do a high-resolution CT chest for ILD and lung cancer screening and set of low-dose CT.  -Follow-up - 2 months Dr Marchelle Gearing; symptom score and walk at followup  -30-minute visit

## 2023-06-14 NOTE — Progress Notes (Signed)
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 [address] [place] [address] PCP [name], [name] Patient Care Team: [name], [name] as PCP - General (Internal Medicine) Georgeanna Lea, MD as PCP - Cardiology (Cardiology)  This Provider for this visit: Treatment Team:  Attending Provider: Kalman Shan, MD    09/16/2022 -   Chief Complaint  Patient presents with   Hospitalization Follow-up    Blood clot in lung, not feeling good, Gallbladder pain.  HPI Quenten Nawaz 73 y.o. -returns for post hospital follow-up.  This is an unscheduled visit otherwise.  His pulm fibrosis itself is stable.  He presents with his wife.  Wife is an independent historian.  History is also gained from review of the medical records especially external medical record related to hospitalization 08/28/2022 - 09/01/2022.  He  had dual problems of subsegmental pulmonary embolism and also acute cholecystitis.  Discharged on Eliquis.  Also status post gallbladder drain.  He was on oxygen but he is now off oxygen.  He is getting home physical therapy.  He is upset about his illness but he feels he is gaining strength.  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  He is tolerating high-dose prescription of pirfenidone that requires intensive therapeutic monitoring well.  He had blood work 09/13/2022.  Liver function test was normal.  Hemoglobin and kidney function are normal.  Also visualized the CT scan of the chest from August 29, 2022: Agree with the pulmonary embolism and I also feel that the pulmonary fibrosis itself is stable.  With my personal independent of interpretation.    Other issues - She did see psychiatry on 09/14/2022.  His BuSpar was increased.  Other medications noted above but continued.   10/13/2022- Interim hx  Patient presents today for hospital follow-up.  He was admitted from 10/05/2022 - 10/09/2022 for sepsis secondary to Klebsiella bacteremia.  Originally presented with abdominal pain and decreased cholecystostomy tube output.  He was treated with IV Rocephin and Flagyl, transition to oral Levaquin on 10/08/2022.  Prior to this admission he had been treated for cholecystitis for close to 5 weeks, underwent cholecystostomy drain placement first week of June.  Drain repositioned by IR on 10/06/2022.  Patient was cleared by IR and general surgery for discharge.  Will eventually need Cholecystectomy with Dr. Feliciana Rossetti with [facility] general surgery after clearance with his primary cardiologist and pulmonologist. Tentative date is mid September.   Patient follows with Dr. Marchelle Gearing for history of IPF and COPD. Maintained on LABA/LAMA, pirfenidone and CPAP at bedtime. He was seen by Dr. Gwenyth Bender in June and IPF felt to be stable since December  2022 based on PFTs and symptoms. Tolerating Esbriet. Patient reports that his O2 was fluctuating prior to hospitalization. He was discharged on 2L which has been approved through 2027. He has been wearing oxygen at night but not CPAP. He needs adaptor to connect oxygen to CPAP. Spirometry is scheduled for Sept 5th. He is on Eliquis for history of paroxysmal A-fib and history of PE.   Airview download 09/12/2022 - 10/11/2022 Usage days 20/30 days (67%); 11 days (37%) greater than 4 hours Average usage days used 4 hours 19 minutes Pressure 5 to 20 cm H2O (9.4 cm H2O 95%) Air leaks 18.8 L/min (95%) AHI 1.0  OV 11/25/2022  Subjective:  Patient ID: Robert Lang, male , DOB: 01/01/1951 , age 59 y.o. , MRN: [id number] , ADDRESS: [address] [place] [address] PCP [name], [name] Patient Care Team: Marisue Brooklyn as PCP - General (Internal Medicine) Georgeanna Lea, MD as PCP - Cardiology (Cardiology) Colletta [place], RN as [facility] Care Management  This Provider for this visit: Treatment Team:  Attending Provider: Kalman Shan, MD   11/25/2022 -   Chief Complaint  Patient presents with   Follow-up    PFT 11/25/22 SOB      HPI Jkai Arwood 73 y.o. -returns for follow-up.  Last seen in June 2024.  Wife is here and is an independent historian.  He is still dealing with his gallbladder issues.  His gallbladder drain is still there.  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  Since then he states he been hospitalized couple of times most recently Lang 2024 for sepsis related to his gallbladder.  Currently he is at home and doing physical therapy and reconditioning quite well.  He is now on oxygen therapy at night and in the daytime.  He wants portable oxygen.  He feels his oxygen is helping him.  His pulmonary function test appears to be stable in the last year but progressed compared to a year and  a half ago.  We did a sit/stand hypoxemia test and his pulse ox is normal on room air at rest but he does desaturate very transiently to 88% at the end of exertion.  There is a huge improvement though.  He wants a preop clearance and so to see.  I did tell him that is now 3 months since he had his blood clot and given the improvement in hypoxemia it may not be a bad time to do a cholecystectomy although he is at risk for recurrence of blood clot in the immediate days after surgery.  He understands this but he wants his drain out.  He  did agree to get a BNP checked.  Recently in Lang 2012 is high and a D-dimer and I will do some restratification.Marland Kitchen  He does want portable oxygen..     Sudie Grumbling 06/14/2023  Subjective:  Patient ID: Robert Lang, male , DOB: 1950/04/08 , age 48 y.o. , MRN: [id number] , ADDRESS: [address] 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  2 antihypertensives had to be stopped -Stable high-resolution CT chest April 2023 compared to February 2022  High risk prescription of pirfenidone  - Esbriet/Pirfenidone requires intensive drug monitoring due to high  concerns for Adverse effects of , including  Drug Induced Liver Injury, significant GI side effects that include but not limited to Diarrhea, Nausea, Vomiting,  and other system side effects that include Fatigue, headaches, weight loss and other side effects such as skin rash. These will be monitored with  blood work such as LFT initially once a month for 6 months and then quarterly    Mild associated emphysema  -Noticed in April 2023 high-resolution CT chest and started on Stiolto  Atrial flutter status post ablation 2017  -On Xarelto  Grade 1 diastolic dysfunction  -Echocardiogram Sept 2023 (no PASP)  - August 2024: No PAP eelvation   Coronary artery disease  -On GDMT treatment  OSA CPAP pending end of 2022   Mild gait unsteadiness due to spinal issues     - uses cane  Long-term turmeric use  Lung nodules  - feb 2022:L Three scattered solid basilar left lower lobe pulmonary nodules, largest 7 mm posteromedially.   -CT chest April 2023 describes this as 4 mm subpleural nodules.   History of COVID-19 Memorial Day weekend 2023   Admission early June 2023 with a syncope  -Antviral Paxlovid considered as an etiology and dehydrtion  -MRI negative, EEG unremarkable  EKG with bifascicular block  -Cleared by neurology September 15, 2021 outpatient  -ZIO monitor placed June 2023 wounds.\  - QtcF 04/20/2022  Prolonged Qtc >  - QTcF - 04/20/2022  - QTc 478 msec on EKG June 2024 personally visualzied and itnerpreted  - Qtc 463 mosec Lang 2024 -    Admission 08/28/2022 - 09/01/2022  -Small subsegmental pulmonary embolism treatment with anticoagulation  -Acute cholecystitis status post cholecystostomy -October 2024  was admitted from 10/05/2022 - 10/09/2022 for sepsis secondary to Klebsiella bacteremia.   Depressive disorder, generalized anxiety disorder and insomnia  -Under the care of Avelina Laine nurse practitioner psychiatry  -On Zoloft, trazodone, BuSpar,  Lamictal, lorazepam, Wellbutrin   HPI Gamble Enderle 73 y.o. -returns for follow-up.  Presents with his  wife.  Since I last saw him in September 2024 no admissions other than for cholecystectomy.  He states he is been doing well his physical conditioning is better but he states he is a little more short of breath and he seems to be desaturating a little more easily than in the past.  Today he desaturated doing a sit/stand 6 times [last time it was 10 times].  He did have a lung cancer screening chest in February 2025 ILD is not reported on that but I personally visualized it and he does have ILD.  In fact he has crackles.  He is taking full dose pirfenidone and is tolerating it well.  He brought to my attention that he is having intermittent chronic lipase elevations.  I reviewed his lab results and's been going on since 2023.  This was after he started his pirfenidone.  I did indicate to him I did see 1 IPF patient was on nintedanib have unexplained lipase elevations and it was unrelated to nintedanib.  Nevertheless we took a shared decision making to give his pirfenidone a holiday and recheck his lipase.  He is willing to do that.  But his biggest concern is desaturations with exercise.  A year ago he did not have any pulmonary hypertension.  He says when he takes a shower his pulse ox on room air drops to 88%.  On 2 L he is around 93%.  His last pulmonary function test was in September 2024.  I could not determine if he had progressed and is CT scan because is a low-dose CT scan.     She also plans to go to [place] next.  2026.  I told him we will discuss this later.                  SYMPTOM SCALE - ILD 06/17/2020   07/28/2020   09/09/2020 184# 11/18/2020 185#  Esbriet 801mg  BID 12/19/2020   Esbriet 801mg  TID  02/19/2021 187# 06/23/2021 180# 07/14/2021 Esbriet 801mg  TID  09/24/2021 188# - esbreit and also stiolto 01/07/2022 206# 04/20/2022 200# esbriet 11/25/2022 191# - esbriet 06/14/2023 202#   O2 use ra RA     RA   ra0 RA  ra ra ra ra ra  Shortness of Breath 0 -> 5 scale with 5 being worst (score 6 If unable to do) 0-5               At rest 3 0.5 2 4 3   0 3 2 3 3 3 3   Simple tasks - showers, clothes change, eating, shaving 5 3 3 3 2  3 3 4 4 2 4 5   Household (dishes, doing bed, laundry) 5 3 (he does not do these tasks) 5 0 3 (he does not do these tasks)  0 NA 4 5 2 4  x  Shopping 5 3 (he does not do this often) 3 1 3  (he does not do these tasks)  4 3.5 (uses scooter) 4 5 2 3 5   Walking level at own pace 4 3 3 1 3  5 3 3 3 2 3 5   Walking up Stairs 5 5 4 1 3  5  3.5 4 5 2 4 5   Total (30-36) Dyspnea Score 27 17.5 20 10 17  17 16 21 25 13 21 23   How bad is your cough? 3 2 1  fair 1  0 1 2 2 2 3 2   How bad is your fatigue 4 2 4  fair 3  3 4 3 5 2 4 5   How bad is nausea 0 0 3 good 2  3 2 2 2  0 3 1  How bad is vomiting?   0 0 0 good 0  0 0 0 0 0 0 0  How bad is diarrhea? 3 0 0 good 0  0 0 1 2 0 0 1  How bad is anxiety? 5 5 3  fair 2  5 5 3 5 3 4 5   How bad is depression 5 5 3  faor 3  3 4 3 4 3 4 4          Simple office walk 185 feet x  3 laps goal with forehead probe 06/23/2021  09/24/2021  01/07/2022  09/16/2022   ra  Sit stand x 5 with cane  stead  93% and 102/min  92% and 109/min  no  no  yes  x  x   11/25/2022  06/14/2023   O2 used ra ra ra  ra ra  Number laps completed 2 of 3 Did all 3 withe cane Did all 3 with cane  Sit stand x 10 Sit stand x 15  Comments about pace Not done Slow pace with cane Slow pace - stopped 2-3 times in fist lap  Good pace Sopped at 6 got tired  Resting Pulse Ox/HR 98% and 97/min 99% and HR 102 99% and HR 86  96% and HR 92 96% nahd HR 85  Final Pulse Ox/HR 97% and 101/min 99% and HR 112 98% and HR 100  88% and HR 101 85% and HR 93  Desaturated </= 88% no no no     Desaturated <= 3% points no no no     Got Tachycardic >/= 90/min yes Ye even at rest yes     Symptoms at end of test Not rcorded Modrate dyspnea Moderate to severe dyspnea  7 of 10  dyspnea Very winded  Miscellaneous comments x         PFT     Latest Ref Rng & Units 11/25/2022    8:56 AM 04/20/2022    9:18 AM 01/07/2022    8:30 AM 06/23/2021    9:23 AM 02/19/2021   11:47 AM 11/18/2020    9:04 AM 05/09/2020    8:46 AM  PFT Results  FVC-Pre L 3.08  3.11  3.05  3.48  3.49  3.33  3.21   FVC-Predicted Pre % 68  68  66  76  75  72  69   FVC-Post L       3.38   FVC-Predicted Post %       73   Pre FEV1/FVC % % 76  75  73  76  73  79  81   Post FEV1/FCV % %       76   FEV1-Pre L 2.34  2.34  2.21  2.64  2.56  2.63  2.60   FEV1-Predicted Pre % 70  70  66  79  75  77  76   FEV1-Post L       2.55   DLCO uncorrected ml/min/mmHg 17.72  17.01  16.62  17.94  20.29  19.95  22.07   DLCO UNC% % 67  64  62  67  75  74  82   DLCO corrected ml/min/mmHg 18.88  17.01  16.62  17.94  20.29  19.95  21.66   DLCO COR %Predicted % 71  64  62  67  75  74  81   DLVA Predicted % 82  74  74  79  81  79  100   TLC L       7.61   TLC % Predicted %       105   RV % Predicted %       139        LAB RESULTS last 96 hours No results found.       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), Allergic rhinitis due to pollen (09/03/2013), Allergy, Anticoagulated (07/01/2016), Anxiety, Arthritis, Atrial fibrillation (HCC), Atrial flutter (HCC) (06/26/2015), CAD (coronary artery disease), Chest pain (06/26/2015), COPD (chronic obstructive pulmonary disease) (HCC), Coronary artery disease (07/06/2018), DDD (degenerative disc disease), cervical (09/03/2013), Depression, Dizziness (10/28/2017), Dyslipidemia, goal LDL below 70 (07/06/2018), Dyspnea on exertion (10/20/2018), Dysrhythmia, Essential hypertension (07/06/2018), ETOH abuse (01/11/2019), Falls (10/28/2017), GERD (gastroesophageal reflux disease), H/O amiodarone therapy (07/01/2016), Heart palpitations (01/27/2017), History of  colon polyps, History of kidney stones, Hyperlipidemia, Hypertension, IPF (idiopathic pulmonary fibrosis) (HCC), Neck pain (09/03/2013), Neuropathy (07/06/2018), Obstructive sleep apnea (08/05/2015), PLMD (periodic limb movement disorder) (11/04/2015), Polycythemia, 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   reports that he quit smoking about 4 years ago. His smoking use included cigarettes. He started smoking about 34 years ago. He has a 30 pack-year smoking history. He has quit using smokeless tobacco.  Past Surgical History:  Procedure Laterality Date   ATRIAL FIBRILLATION ABLATION  10/2015   BACK SURGERY     CARDIOVERSION  2017   CATARACT EXTRACTION Bilateral 2017   March and April 2017   CHOLECYSTECTOMY N/A 12/21/2022   Procedure: LAPAROSCOPIC CHOLECYSTECTOMY AND REMOVAL OF BILIARY DRAIN;  Surgeon: Sheliah Hatch De Blanch, MD;  Location: [facility];  Service: General;  Laterality: N/A;   COLONOSCOPY  2018   CORONARY STENT PLACEMENT  2012   CORONARY/GRAFT ACUTE MI REVASCULARIZATION N/A 01/10/2019   Procedure: CORONARY/GRAFT ACUTE MI REVASCULARIZATION;  Surgeon: Marykay Lex, MD;  Location: [facility] INVASIVE CV LAB;  Service: Cardiovascular;  Laterality: N/A;   INGUINAL HERNIA REPAIR     over 20 years ago   IR CHOLANGIOGRAM EXISTING TUBE  09/16/2022   IR EXCHANGE BILIARY DRAIN  10/06/2022   IR PERC CHOLECYSTOSTOMY  08/30/2022   LAPAROSCOPIC APPENDECTOMY N/A 01/17/2021   Procedure: APPENDECTOMY LAPAROSCOPIC;  Surgeon: Quentin Ore, MD;  Location: [facility] OR;  Service: General;  Laterality: N/A;   LEFT HEART CATH AND CORONARY ANGIOGRAPHY N/A 01/10/2019   Procedure: LEFT HEART CATH AND CORONARY ANGIOGRAPHY;  Surgeon: Marykay Lex, MD;  Location: [facility] INVASIVE CV LAB;  Service: Cardiovascular;  Laterality: N/A;   LUMBAR LAMINECTOMY Bilateral 04/05/2016   L2-L5    VENTRAL  HERNIA REPAIR  2018    Allergies  Allergen Reactions   Naproxen Other (See Comments)    (Naprosyn *ANALGESICS - ANTI-INFLAMMATORY*) Nausea, Abdominal pain   Rapaflo [Silodosin] Other (See Comments)    Low blood pressure    Immunization History  Administered Date(s) Administered   Influenza Split 01/09/2010, 02/09/2011   Influenza, High Dose Seasonal PF 11/15/2018, 01/11/2020, 12/27/2020, 12/24/2021   Influenza,inj,Quad PF,6+ Mos 01/01/2015   Influenza-Unspecified 01/30/2014, 02/02/2016, 12/15/2020   PFIZER(Purple Top)SARS-COV-2 Vaccination 04/27/2019, 05/18/2019, 01/14/2020, 07/22/2020, 12/15/2020, 12/24/2021   Pneumococcal Conjugate-13 11/23/2018   Pneumococcal Polysaccharide-23 08/12/2020   Respiratory Syncytial Virus Vaccine,Recomb Aduvanted(Arexvy) 12/17/2021   Tdap 02/18/2011  Family History  Problem Relation Age of Onset   Anxiety disorder Mother    Alcohol abuse Father    Colon cancer Father    Depression Brother    Alcohol abuse Brother    Throat cancer Brother      Current Outpatient Medications:    acetaminophen (TYLENOL) 500 MG tablet, Take 1 tablet (500 mg total) by mouth every 8 (eight) hours as needed for moderate pain., Disp: 100 tablet, Rfl: 0   apixaban (ELIQUIS) 5 MG TABS tablet, TAKE 1 TABLET(5 MG) BY MOUTH TWICE DAILY, Disp: 60 tablet, Rfl: 5   atorvastatin (LIPITOR) 80 MG tablet, TAKE 1 TABLET(80 MG) BY MOUTH DAILY (Patient taking differently: Take 80 mg by mouth daily.), Disp: 90 tablet, Rfl: 2   buPROPion (WELLBUTRIN XL) 150 MG 24 hr tablet, Take 1 tablet (150 mg total) by mouth daily., Disp: 30 tablet, Rfl: 3   buPROPion (WELLBUTRIN XL) 300 MG 24 hr tablet, TAKE 1 TABLET(300 MG) BY MOUTH DAILY, Disp: 30 tablet, Rfl: 0   busPIRone (BUSPAR) 15 MG tablet, Take 1 tablet (15 mg total) by mouth 3 (three) times daily., Disp: 90 tablet, Rfl: 3   Cholecalciferol (VITAMIN D3) 50 MCG (2000 UT) TABS, Take 4,000 Units by mouth 2 (two) times daily., Disp: , Rfl:     docusate sodium (COLACE) 100 MG capsule, Take 100 mg by mouth daily., Disp: , Rfl:    famotidine (PEPCID) 40 MG tablet, Take 1 tablet (40 mg total) by mouth daily., Disp: 90 tablet, Rfl: 3   fexofenadine (ALLEGRA) 180 MG tablet, Take 180 mg by mouth at bedtime. , Disp: , Rfl:    finasteride (PROSCAR) 5 MG tablet, Take 1 tablet (5 mg total) by mouth daily. (Patient taking differently: Take 5 mg by mouth at bedtime.), Disp: 90 tablet, Rfl: 1   lamoTRIgine (LAMICTAL) 100 MG tablet, Take 1 tablet (100 mg total) by mouth daily., Disp: 90 tablet, Rfl: 1   levalbuterol (XOPENEX) 1.25 MG/0.5ML nebulizer solution, Take 1.25 mg by nebulization every 4 (four) hours as needed for wheezing or shortness of breath., Disp: 1 each, Rfl: 12   LORazepam (ATIVAN) 0.5 MG tablet, Take one tablet by mouth only for severe anxiety or agitation (Patient taking differently: Take 0.5 mg by mouth daily as needed for anxiety. Take one tablet by mouth only for severe anxiety or agitation. Has not taking medication yet.), Disp: 30 tablet, Rfl: 1   Melatonin 10 MG TABS, Take 10 mg by mouth at bedtime., Disp: , Rfl:    MUCINEX 600 MG 12 hr tablet, TAKE 2 TABLETS BY MOUTH TWICE DAILY, Disp: 120 tablet, Rfl: 0   Multiple Vitamins-Minerals (PRESERVISION AREDS PO), Take 1 capsule by mouth in the morning and at bedtime., Disp: , Rfl:    nitroGLYCERIN (NITROSTAT) 0.4 MG SL tablet, Place 1 tablet (0.4 mg total) under the tongue every 5 (five) minutes as needed for chest pain., Disp: 25 tablet, Rfl: 3   ondansetron (ZOFRAN) 4 MG tablet, Take 1 tablet (4 mg total) by mouth every 8 (eight) hours as needed for nausea or vomiting., Disp: 20 tablet, Rfl: 0   ondansetron (ZOFRAN) 4 MG tablet, Take 1 tablet (4 mg total) by mouth every 8 (eight) hours as needed for nausea or vomiting., Disp: 20 tablet, Rfl: 0   ondansetron (ZOFRAN) 4 MG tablet, Take 1 tablet (4 mg total) by mouth every 8 (eight) hours as needed for nausea or vomiting., Disp: 20  tablet, Rfl: 0   ondansetron (ZOFRAN-ODT)  4 MG disintegrating tablet, 4mg  ODT q4 hours prn nausea/vomit, Disp: 10 tablet, Rfl: 0   OXYGEN, Inhale 2 L into the lungs daily., Disp: , Rfl:    pantoprazole (PROTONIX) 40 MG tablet, Take 1 tablet (40 mg total) by mouth daily. Please call [phone] to schedule an office visit for more refills, Disp: 90 tablet, Rfl: 1   Pirfenidone (ESBRIET) 801 MG TABS, Take 1 tablet (801 mg total) by mouth 3 (three) times daily., Disp: 270 tablet, Rfl: 1   polyethylene glycol powder (GLYCOLAX/MIRALAX) 17 GM/SCOOP powder, Take 17 g by mouth daily., Disp: , Rfl:    pregabalin (LYRICA) 150 MG capsule, TAKE 1 CAPSULE(150 MG) BY MOUTH TWICE DAILY, Disp: 60 capsule, Rfl: 5   ranolazine (RANEXA) 1000 MG SR tablet, Take 1 tablet (1,000 mg total) by mouth 2 (two) times daily., Disp: 180 tablet, Rfl: 2   sertraline (ZOLOFT) 100 MG tablet, Take 2 tablets (200 mg total) by mouth at bedtime., Disp: 180 tablet, Rfl: 1   traZODone (DESYREL) 50 MG tablet, Take 1 tablet (50 mg total) by mouth at bedtime., Disp: 90 tablet, Rfl: 1   vitamin B-12 (CYANOCOBALAMIN) 1000 MCG tablet, Take 1,000 mcg by mouth daily., Disp: , Rfl:    vitamin E 1000 UNIT capsule, Take 1,000 Units by mouth 2 (two) times daily at 8 am and 10 pm., Disp: , Rfl:    zolpidem (AMBIEN) 5 MG tablet, TAKE 1 TABLET(5 MG) BY MOUTH AT BEDTIME AS NEEDED FOR SLEEP, Disp: 30 tablet, Rfl: 1   amoxicillin-clavulanate (AUGMENTIN) 875-125 MG tablet, Take 1 tablet by mouth 2 (two) times daily. (Patient not taking: Reported on 06/14/2023), Disp: 20 tablet, Rfl: 0   amoxicillin-clavulanate (AUGMENTIN) 875-125 MG tablet, Take 1 tablet by mouth 2 (two) times daily. (Patient not taking: Reported on 06/14/2023), Disp: 20 tablet, Rfl: 0   oxyCODONE (ROXICODONE) 5 MG immediate release tablet, Take 1 tablet (5 mg total) by mouth every 4 (four) hours as needed for severe pain (pain score 7-10). (Patient not taking: Reported on 06/14/2023), Disp: 10  tablet, Rfl: 0   Sodium Chloride Flush (NORMAL SALINE FLUSH) 0.9 % SOLN, Flush drain with 5 mLs daily. Discard remaining 5ml in each syringe. (Patient not taking: Reported on 06/14/2023), Disp: 200 mL, Rfl: 0 No current facility-administered medications for this visit.  Facility-Administered Medications Ordered in Other Visits:    regadenoson (LEXISCAN) injection SOLN 0.4 mg, 0.4 mg, Intravenous, Once, Tobb, Kardie, DO   technetium tetrofosmin (TC-MYOVIEW) injection 31.7 millicurie, 31.7 millicurie, Intravenous, Once PRN, Tobb, Kardie, DO      Objective:   Vitals:   06/14/23 1533  BP: 110/62  Pulse: 86  Temp: 97.7 F (36.5 C)  TempSrc: Oral  SpO2: 93%  Weight: 202 lb (91.6 kg)  Height: 5\' 11"  (1.803 m)    Estimated body mass index is 28.17 kg/m as calculated from the following:   Height as of this encounter: 5\' 11"  (1.803 m).   Weight as of this encounter: 202 lb (91.6 kg).  @WEIGHTCHANGE @  Filed Weights   06/14/23 1533  Weight: 202 lb (91.6 kg)     Physical Exam   General: No distress.  Looks the same. O2 at rest: no Cane present: YES Sitting in wheel chair: no Frail: no Obese: Overweight Neuro: Alert and Oriented x 3. GCS 15. Speech normal Psych: Pleasant Resp:  Barrel Chest - no.  Wheeze - no, Crackles - yes base, No overt respiratory distress CVS: Normal heart sounds. Murmurs - no Ext:  Stigmata of Connective Tissue Disease - no HEENT: Normal upper airway. PEERL +. No post nasal drip        Assessment:       ICD-10-CM   1. IPF (idiopathic pulmonary fibrosis) (HCC)  Z61.096 Pulmonary function test    Lipase    Amylase    2. Exercise hypoxemia  R09.02 Pulmonary function test    Lipase    Amylase    3. Medication monitoring encounter  Z51.81 Pulmonary function test    Lipase    Amylase    4. Serum lipase elevation  R74.8 Pulmonary function test    Lipase    Amylase    5. Screening for lung cancer  Z12.2          Plan:     Patient  Instructions  IPF (idiopathic pulmonary fibrosis) (HCC) Medication monitoring encounter  -Pulmonary fibrosis itself appears to be stable since OCt 2023/April 2023 through September 2024 -> though I am concerned you could be worse in March 2025 -Unclear if you are worse March 2025 - Overall tolerating Esbriet well.  - LFT normal Lang 2024 and March 2025  plan -Continue night oxygen -Continue portable oxygen -Esbriet hold for 1 weeks for lipase elevations [see below]   Chronic intermittent lipase elevation since 2023 [started pirfenidone 2022]  Plan  - Stop pirfenidone for 1 weeks- > re check lipase -> call us for the results -> and then start pirfenidone at 1 tablet 1 time a week followed by 1 tablet 2 times a week and followed by 1 tablet 3 times a week  -Always take meals  -Spaced in 5-6 hours apart  -Apply sunscreen  Pulmonary embolism small June 2024 requirng o2 in hospital  - on 11/25/2022  - using o2 at night and only very mild exercise hypxoemia  Plan  -cotninue eliquis; duration likely 1+ years -> in sept 2025, we will check D-dimer    Acute cholecystisis s/p drain Lang 2024 S/p thank you cholecystectomy October 2024  Plan   -Expectant follow-up per surgery  Traveled by road to [place] in April 2026  Plan - We can address this sometime in February 2026  Lung cancer screening  Plan - February 2026 we will do a high-resolution CT chest for ILD and lung cancer screening and set of low-dose CT.  -Follow-up - 2 months Dr Marchelle Gearing; symptom score and walk at followup  -30-minute visit   FOLLOWUP Return in about 2 months (around 08/14/2023).   ( Level 05 visit E&M 2024: Estb >= 40 min in  visit type: on-site physical face to visit  in total care time and counseling or/and coordination of care by this undersigned MD - Dr Kalman Shan. This includes one or more of the following on this same day 06/14/2023: pre-charting, chart review, note writing, documentation  discussion of test results, diagnostic or treatment recommendations, prognosis, risks and benefits of management options, instructions, education, compliance or risk-factor reduction. It excludes time spent by the CMA or office staff in the care of the patient. Actual time 45 min)  SIGNATURE    Dr. Kalman Shan, M.D., F.C.C.P,  Pulmonary and Critical Care Medicine Staff Physician, [facility] Director - Interstitial Lung Disease  Program  Pulmonary Fibrosis [facility] at [facility] [place], [place], [address]  Pager: [phone], If no answer or between  15:00h - 7:00h: call [phone] Telephone: [phone]  5:31 PM 06/14/2023

## 2023-06-21 ENCOUNTER — Telehealth: Payer: Self-pay | Admitting: Pharmacist

## 2023-06-21 NOTE — Telephone Encounter (Signed)
 Received fax from [facility] stating prescriber form expires on 06/24/2023. Completed and faxed to [facility]  Phone #: [phone] Fax #: [phone]

## 2023-06-22 ENCOUNTER — Telehealth: Payer: Self-pay | Admitting: Internal Medicine

## 2023-06-22 ENCOUNTER — Encounter: Payer: Self-pay | Admitting: Neurology

## 2023-06-22 ENCOUNTER — Other Ambulatory Visit: Payer: Self-pay | Admitting: Neurology

## 2023-06-22 ENCOUNTER — Ambulatory Visit: Payer: Medicare Other | Admitting: Neurology

## 2023-06-22 VITALS — Ht 69.0 in | Wt 198.0 lb

## 2023-06-22 DIAGNOSIS — G2581 Restless legs syndrome: Secondary | ICD-10-CM

## 2023-06-22 DIAGNOSIS — G3184 Mild cognitive impairment, so stated: Secondary | ICD-10-CM

## 2023-06-22 DIAGNOSIS — G629 Polyneuropathy, unspecified: Secondary | ICD-10-CM | POA: Diagnosis not present

## 2023-06-22 LAB — LIPASE: Lipase: 70 U/L — ABNORMAL HIGH (ref 11.0–59.0)

## 2023-06-22 LAB — AMYLASE: Amylase: 49 U/L (ref 27–131)

## 2023-06-22 MED ORDER — ROPINIROLE HCL 0.5 MG PO TABS: 0.5000 mg | ORAL_TABLET | Freq: Every day | ORAL | 0 refills | Status: DC

## 2023-06-22 MED ORDER — ZOLPIDEM TARTRATE 5 MG PO TABS: 5.0000 mg | ORAL_TABLET | Freq: Every day | ORAL | 5 refills | Status: DC

## 2023-06-22 NOTE — Patient Instructions (Addendum)
 Continue current medications  Trial of Ropinirole nightly. Please call in a week with updates Will check Iron level  Increase exercise, at least 20 minutes daily, 5 days a week.  Return in 6 months or sooner if worse

## 2023-06-22 NOTE — Telephone Encounter (Signed)
  Lipase - HIGH despite stopping anti fibrotic  Plan Not related to anti fibrotic     Latest Reference Range & Units 01/15/21 12:55 04/20/21 09:06 04/27/21 09:49 05/05/21 08:35 05/13/21 22:14 05/18/21 11:04 08/29/22 02:51 09/08/22 14:43 09/13/22 10:00 09/16/22 18:25 09/17/22 01:22 05/09/23 11:40 05/16/23 10:05 05/30/23 10:44 06/22/23 10:10  Lipase 11.0 - 59.0 U/L 50 71.0 (H) 70.0 (H) 78.0 (H) 61 (H) 57.0 47 200.0 (H) 133.0 (H) 66 (H) 49 52 (H) 67 (H) 70.0 (H) 70.0 (H)  (H): Data is abnormally high

## 2023-06-22 NOTE — Progress Notes (Signed)
 [facility]  PATIENT: Robert Lang DOB: [birth year]  REQUESTING CLINICIAN: [name], [name], [name] HISTORY FROM: Patient and spouse  REASON FOR VISIT: Recurrent syncope    HISTORICAL  CHIEF COMPLAINT:  Chief Complaint  Patient presents with   Follow-up    Pt in 12, here with wife Robert Lang Pt is following up on peripheral neuropathy and memory. Pt states he continues with neuropathy on both feet, states is mostly on the left. States sometimes the pain wakes him up from sleep. Pt states the toenails on his feet are changing to a grayish color. Pt states regarding memory, he feels is declining a little more.    INTERVAL HISTORY 06/22/2023 Patient presents today for follow-up, he is accompanied by wife.  Last visit was 6 months ago.  Since then, he tells me that his symptoms are the same, it is controlled with pregabalin during the daytime but at night they do get severe to the point that sometimes, he has to get up and walk around, or he will go to sleep in the recliner. On top of that, he feels like his memory is getting worse, he is easily distracted, more forgetful.  Rely on wife more for reminders.  Tells me that he permanently uses the supplemental O2 now.  Starts to exercise but he is not consistent with walking daily.   INTERVAL HISTORY 10/27/2022:  Patient presents today for follow-up for follow-up, he is accompanied by wife.  Last visit was in March at that time we restarted him on pregabalin 150 mg twice daily.  He reports with the pregabalin 150 mg twice daily his neuropathic pain is well-controlled.  Unfortunately in the past month he has been in and out of the hospital due to pulmonary embolism, gallbladder disease, which was infected, developed sepsis.  Now he has a drain.  He is also pending surgery scheduled in September.  He is currently using supplemental O2.  He has a follow-up set up with pulmonology soon.  Again in term of the neuropathy, he reports on  occasion he will have additional burning pain but overall it is much better than previously.   INTERVAL HISTORY 06/15/2022:  Patient presents today for follow-up, last visit was in December.  At that time we started him on lidocaine cream since the Pregabalin had caused weight gain which affect his respiratory status.  He reports that lidocaine has not been helpful.  He is in extreme pain.  He does take Ambien at night for sleep and some night he has to double the dose because he cannot sleep due to pain.  Again the pain is extreme worse on the left.  He reports that he has made some change to his diet, stop eating ice cream, decreasing his food intake and he has lost 10 to 15 pounds.  He would like to go back on the Lyrica since that is the only medication that has been helpful. He is not interested in a spinal stimulator for neuropathic pain.     INTERVAL HISTORY 03/01/2022:  Patient presents today for follow-up, at last visit we had planned to start him on pregabalin.  Initially he was doing well in terms of the neuropathic pain but has developed weight gain which in turn compromised his respiratory effort.  After discussion with his pulmonologist and with patient, we decided to discontinue Lyrica and start patient on amitriptyline.  He reports that amitriptyline is not helpful, it is not helpful with the neuropathy pain at night which keeps  him from sleep. Last night, he had to take 4 Tylenol in order to control the pain and obtain some rest.    INTERVAL HISTORY 11/18/2021:  Patient presents today for follow-up, since last visit on 6/27, he denies any additional falls.  His current complaint right now is bilateral feet numbness, and pain.  He reports being diagnosed with neuropathy for many years but lately he has been experiencing burning pain in both feet.  The pain will keep him up at night.  He is on gabapentin 400 mg at nighttime but sometimes he would take extra dose in the middle of the night  if the pain wakes him up.  Denies any involvement of the legs, but reports at times he will have sharp shooting pain that starts from the left-sided back all the way down to the toes.   HISTORY OF PRESENT ILLNESS:  This is a 73 year old male with multiple medical conditions including hypertension, hyperlipidemia, atrial fibrillation, COPD, pulmonary fibrosis who is presenting after being admitted to the hospital after multiple syncopal episodes.  Patient reports in end of May he got tested positive for COVID, he was having symptoms and was started on Plaxovid which he completed on June 4.  During this time while he was battling COVID, he did have generalized weakness, he actually had a fall and had difficulty getting of the floor, and on June 6 while being out with his wife had a syncopal episode while in the car.  Prior to the syncope he did complain of dizziness and feeling weak. He presented to [facility], admitted from the 6 to the 7 for syncope, initial work-up including MRI Brain was negative for any etiology of the syncope. On June 9 he presented again to [facility] hospital for 2 additional syncope, prior to the episode he reported feeling dizzy.  In the ED he had 1 syncopal episode which was witnessed and there is report of left leg shaking with the syncope. There was no associated urinary incontinence, no tongue biting and no postictal fusion Again patient was admitted, work-up was unrevealing, he did have overnight EEG which was negative.  Again he was diagnosed with convulsive syncope likely due to dehydration and related to recent COVID infection. Since being discharged from the hospital, he reports feeling better, doing pretty good, he feels weak, feel drained but again no additional syncopal episode.  He is scheduled to see his pulmonologist on 5 July    OTHER MEDICAL CONDITIONS: Pulmonary fibrosis, depression/anxiety, hypertension, hyperlipidemia, atrial fibrillation, COPD, obstructive sleep  apnea on CPAP   REVIEW OF SYSTEMS: Full 14 system review of systems performed and negative with exception of: as noted in the HPI   ALLERGIES: Allergies  Allergen Reactions   Naproxen Other (See Comments)    (Naprosyn *ANALGESICS - ANTI-INFLAMMATORY*) Nausea, Abdominal pain   Rapaflo [Silodosin] Other (See Comments)    Low blood pressure    HOME MEDICATIONS: Outpatient Medications Prior to Visit  Medication Sig Dispense Refill   acetaminophen (TYLENOL) 500 MG tablet Take 1 tablet (500 mg total) by mouth every 8 (eight) hours as needed for moderate pain. 100 tablet 0   apixaban (ELIQUIS) 5 MG TABS tablet TAKE 1 TABLET(5 MG) BY MOUTH TWICE DAILY 60 tablet 5   atorvastatin (LIPITOR) 80 MG tablet TAKE 1 TABLET(80 MG) BY MOUTH DAILY (Patient taking differently: Take 80 mg by mouth daily.) 90 tablet 2   buPROPion (WELLBUTRIN XL) 150 MG 24 hr tablet Take 1 tablet (150 mg total) by  mouth daily. 30 tablet 3   busPIRone (BUSPAR) 15 MG tablet Take 1 tablet (15 mg total) by mouth 3 (three) times daily. 90 tablet 3   Cholecalciferol (VITAMIN D3) 50 MCG (2000 UT) TABS Take 4,000 Units by mouth 2 (two) times daily.     docusate sodium (COLACE) 100 MG capsule Take 100 mg by mouth daily.     famotidine (PEPCID) 40 MG tablet Take 1 tablet (40 mg total) by mouth daily. 90 tablet 3   fexofenadine (ALLEGRA) 180 MG tablet Take 180 mg by mouth at bedtime.      finasteride (PROSCAR) 5 MG tablet Take 1 tablet (5 mg total) by mouth daily. (Patient taking differently: Take 5 mg by mouth at bedtime.) 90 tablet 1   lamoTRIgine (LAMICTAL) 100 MG tablet Take 1 tablet (100 mg total) by mouth daily. 90 tablet 1   levalbuterol (XOPENEX) 1.25 MG/0.5ML nebulizer solution Take 1.25 mg by nebulization every 4 (four) hours as needed for wheezing or shortness of breath. 1 each 12   LORazepam (ATIVAN) 0.5 MG tablet Take one tablet by mouth only for severe anxiety or agitation (Patient taking differently: Take 0.5 mg by mouth  daily as needed for anxiety. Take one tablet by mouth only for severe anxiety or agitation. Has not taking medication yet.) 30 tablet 1   Melatonin 10 MG TABS Take 10 mg by mouth at bedtime.     MUCINEX 600 MG 12 hr tablet TAKE 2 TABLETS BY MOUTH TWICE DAILY 120 tablet 0   Multiple Vitamins-Minerals (PRESERVISION AREDS PO) Take 1 capsule by mouth in the morning and at bedtime.     nitroGLYCERIN (NITROSTAT) 0.4 MG SL tablet Place 1 tablet (0.4 mg total) under the tongue every 5 (five) minutes as needed for chest pain. 25 tablet 3   ondansetron (ZOFRAN) 4 MG tablet Take 1 tablet (4 mg total) by mouth every 8 (eight) hours as needed for nausea or vomiting. 20 tablet 0   ondansetron (ZOFRAN) 4 MG tablet Take 1 tablet (4 mg total) by mouth every 8 (eight) hours as needed for nausea or vomiting. 20 tablet 0   ondansetron (ZOFRAN) 4 MG tablet Take 1 tablet (4 mg total) by mouth every 8 (eight) hours as needed for nausea or vomiting. 20 tablet 0   ondansetron (ZOFRAN-ODT) 4 MG disintegrating tablet 4mg  ODT q4 hours prn nausea/vomit 10 tablet 0   OXYGEN Inhale 2 L into the lungs daily.     pantoprazole (PROTONIX) 40 MG tablet Take 1 tablet (40 mg total) by mouth daily. Please call [phone] to schedule an office visit for more refills 90 tablet 1   Pirfenidone (ESBRIET) 801 MG TABS Take 1 tablet (801 mg total) by mouth 3 (three) times daily. 270 tablet 1   polyethylene glycol powder (GLYCOLAX/MIRALAX) 17 GM/SCOOP powder Take 17 g by mouth daily.     pregabalin (LYRICA) 150 MG capsule TAKE 1 CAPSULE(150 MG) BY MOUTH TWICE DAILY 60 capsule 5   ranolazine (RANEXA) 1000 MG SR tablet Take 1 tablet (1,000 mg total) by mouth 2 (two) times daily. 180 tablet 2   sertraline (ZOLOFT) 100 MG tablet Take 2 tablets (200 mg total) by mouth at bedtime. 180 tablet 1   Sodium Chloride Flush (NORMAL SALINE FLUSH) 0.9 % SOLN Flush drain with 5 mLs daily. Discard remaining 5ml in each syringe. 200 mL 0   traZODone (DESYREL)  50 MG tablet Take 1 tablet (50 mg total) by mouth at bedtime. 90 tablet 1  vitamin B-12 (CYANOCOBALAMIN) 1000 MCG tablet Take 1,000 mcg by mouth daily.     vitamin E 1000 UNIT capsule Take 1,000 Units by mouth 2 (two) times daily at 8 am and 10 pm.     amoxicillin-clavulanate (AUGMENTIN) 875-125 MG tablet Take 1 tablet by mouth 2 (two) times daily. 20 tablet 0   amoxicillin-clavulanate (AUGMENTIN) 875-125 MG tablet Take 1 tablet by mouth 2 (two) times daily. 20 tablet 0   buPROPion (WELLBUTRIN XL) 300 MG 24 hr tablet TAKE 1 TABLET(300 MG) BY MOUTH DAILY 30 tablet 0   oxyCODONE (ROXICODONE) 5 MG immediate release tablet Take 1 tablet (5 mg total) by mouth every 4 (four) hours as needed for severe pain (pain score 7-10). 10 tablet 0   zolpidem (AMBIEN) 5 MG tablet TAKE 1 TABLET(5 MG) BY MOUTH AT BEDTIME AS NEEDED FOR SLEEP 30 tablet 1   Facility-Administered Medications Prior to Visit  Medication Dose Route Frequency Provider Last Rate Last Admin   regadenoson (LEXISCAN) injection SOLN 0.4 mg  0.4 mg Intravenous Once Tobb, Kardie, DO       technetium tetrofosmin (TC-MYOVIEW) injection 31.7 millicurie  31.7 millicurie Intravenous Once PRN Tobb, Kardie, DO        PAST MEDICAL HISTORY: Past Medical History:  Diagnosis Date   Acute ST elevation myocardial infarction (STEMI) of inferolateral wall (HCC) 01/10/2019   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 M75.00)   Allergic rhinitis due to pollen 09/03/2013   J30.1)  Formatting of this note might be different from the original. J30.1)   Allergy    Anticoagulated 07/01/2016   Anxiety    Arthritis    Atrial fibrillation (HCC)    Atrial flutter (HCC) 06/26/2015   CAD (coronary artery disease)    Chest pain 06/26/2015   COPD (chronic obstructive pulmonary disease) (HCC)    Coronary artery disease 07/06/2018   Cardiac catheterization 2017 showing 90% small diagonal  branch disease   DDD (degenerative disc disease), cervical 09/03/2013   M50.90)  Formatting of this note might be different from the original. M50.90)   Depression    Dizziness 10/28/2017   Dyslipidemia, goal LDL below 70 07/06/2018   Dyspnea on exertion 10/20/2018   Dysrhythmia    Essential hypertension 07/06/2018   ETOH abuse 01/11/2019   6 pack of beer per day   Falls 10/28/2017   GERD (gastroesophageal reflux disease)    H/O amiodarone therapy 07/01/2016   Heart palpitations 01/27/2017   History of colon polyps    History of kidney stones    Hyperlipidemia    Hypertension    IPF (idiopathic pulmonary fibrosis) (HCC)    Neck pain 09/03/2013   Neuropathy 07/06/2018   Obstructive sleep apnea 08/05/2015   PLMD (periodic limb movement disorder) 11/04/2015   Polycythemia, 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 STORY: Due to alcohol/ tobacco   Pure hypercholesterolemia 09/03/2013   E78.0)  Formatting of this note might be different from the original. 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/06/2018   2017    PAST SURGICAL HISTORY: Past Surgical History:  Procedure Laterality Date   ATRIAL FIBRILLATION ABLATION  10/2015   BACK SURGERY     CARDIOVERSION  2017   CATARACT EXTRACTION Bilateral 2017   March and April 2017   CHOLECYSTECTOMY  N/A 12/21/2022   Procedure: LAPAROSCOPIC CHOLECYSTECTOMY AND REMOVAL OF BILIARY DRAIN;  Surgeon: Sheliah Hatch De Blanch, MD;  Location: [facility];  Service: General;  Laterality: N/A;   COLONOSCOPY  2018   CORONARY STENT PLACEMENT  2012   CORONARY/GRAFT ACUTE MI REVASCULARIZATION N/A 01/10/2019   Procedure: CORONARY/GRAFT ACUTE MI REVASCULARIZATION;  Surgeon: Marykay Lex, MD;  Location: [facility] INVASIVE CV LAB;  Service: Cardiovascular;  Laterality: N/A;   INGUINAL HERNIA REPAIR     over 20 years ago   IR  CHOLANGIOGRAM EXISTING TUBE  09/16/2022   IR EXCHANGE BILIARY DRAIN  10/06/2022   IR PERC CHOLECYSTOSTOMY  08/30/2022   LAPAROSCOPIC APPENDECTOMY N/A 01/17/2021   Procedure: APPENDECTOMY LAPAROSCOPIC;  Surgeon: Quentin Ore, MD;  Location: [facility] OR;  Service: General;  Laterality: N/A;   LEFT HEART CATH AND CORONARY ANGIOGRAPHY N/A 01/10/2019   Procedure: LEFT HEART CATH AND CORONARY ANGIOGRAPHY;  Surgeon: Marykay Lex, MD;  Location: [facility] INVASIVE CV LAB;  Service: Cardiovascular;  Laterality: N/A;   LUMBAR LAMINECTOMY Bilateral 04/05/2016   L2-L5    VENTRAL HERNIA REPAIR  2018    FAMILY HISTORY: Family History  Problem Relation Age of Onset   Anxiety disorder Mother    Alcohol abuse Father    Colon cancer Father    Depression Brother    Alcohol abuse Brother    Throat cancer Brother     SOCIAL HISTORY: Social History   Socioeconomic History   Marital status: Married    Spouse name: Not on file   Number of children: Not on file   Years of education: Not on file   Highest education level: Not on file  Occupational History   Not on file  Tobacco Use   Smoking status: Former    Current packs/day: 0.00    Average packs/day: 1 pack/day for 30.0 years (30.0 ttl pk-yrs)    Types: Cigarettes    Start date: 01/09/1989    Quit date: 01/10/2019    Years since quitting: 4.4   Smokeless tobacco: Former  Building services engineer status: Never Used  Substance and Sexual Activity   Alcohol use: Not Currently    Comment: 8oz of wine a day   Drug use: Never   Sexual activity: Not on file  Other Topics Concern   Not on file  Social History Narrative   Not on file   Social Drivers of Health   Financial Resource Strain: Low Risk  (12/30/2022)   Overall Financial Resource Strain (CARDIA)    Difficulty of Paying Living Expenses: Not hard at all  Food Insecurity: No Food Insecurity (12/30/2022)   Hunger Vital Sign    Worried About Running Out of Food in the Last Year: Never true     Ran Out of Food in the Last Year: Never true  Transportation Needs: No Transportation Needs (12/30/2022)   PRAPARE - Administrator, Civil Service (Medical): No    Lack of Transportation (Non-Medical): No  Physical Activity: Insufficiently Active (12/30/2022)   Exercise Vital Sign    Days of Exercise per Week: 3 days    Minutes of Exercise per Session: 30 min  Stress: No Stress Concern Present (12/30/2022)   [employer] of Occupational Health - Occupational Stress Questionnaire    Feeling of Stress : Not at all  Social Connections: Socially Integrated (12/30/2022)   Social Connection and Isolation Panel [NHANES]    Frequency of Communication with Friends and Family: More than three times  a week    Frequency of Social Gatherings with Friends and Family: More than three times a week    Attends Religious Services: More than 4 times per year    Active Member of [employer] or Organizations: Yes    Attends Banker Meetings: Never    Marital Status: Married  Catering manager Violence: Not At Risk (12/30/2022)   Humiliation, Afraid, Rape, and Kick questionnaire    Fear of Current or Ex-Partner: No    Emotionally Abused: No    Physically Abused: No    Sexually Abused: No    PHYSICAL EXAM  GENERAL EXAM/CONSTITUTIONAL: Vitals:  Vitals:   06/22/23 0848  Weight: 198 lb (89.8 kg)  Height: 5\' 9"  (1.753 m)    Body mass index is 29.24 kg/m. Wt Readings from Last 3 Encounters:  06/22/23 198 lb (89.8 kg)  06/14/23 202 lb (91.6 kg)  05/30/23 209 lb 3.2 oz (94.9 kg)   Patient is in no distress; well developed, nourished and groomed; neck is supple, 2 L supplemental O2    MUSCULOSKELETAL: Gait, strength, tone, movements noted in Neurologic exam below  NEUROLOGIC: MENTAL STATUS:      No data to display              06/22/2023    8:50 AM 06/15/2022    1:14 PM  Montreal Cognitive Assessment   Visuospatial/ Executive (0/5) 1 3  Naming (0/3) 3 3   Attention: Read list of digits (0/2) 1 2  Attention: Read list of letters (0/1) 1 1  Attention: Serial 7 subtraction starting at 100 (0/3) 2 1  Language: Repeat phrase (0/2) 1 1  Language : Fluency (0/1) 0 1  Abstraction (0/2) 2 1  Delayed Recall (0/5) 2 0  Orientation (0/6) 5 6  Total 18 19    awake, alert, oriented to person, place and time recent and remote memory intact normal attention and concentration language fluent, comprehension intact, naming intact fund of knowledge appropriate  CRANIAL NERVE:  2nd, 3rd, 4th, 6th - visual fields full to confrontation, extraocular muscles intact, no nystagmus 5th - facial sensation symmetric 7th - facial strength symmetric 8th - hearing intact 9th - palate elevates symmetrically, uvula midline 11th - shoulder shrug symmetric 12th - tongue protrusion midline  MOTOR:  normal bulk and tone, full strength in the BUE, BLE  SENSORY:  Decrease light touch, pinprick and vibration all the way to ankle bilaterally. He also has absent proprioception.   COORDINATION:  finger-nose-finger, fine finger movements normal   GAIT   Ambulates with a cane    DIAGNOSTIC DATA (LABS, IMAGING, TESTING) - I reviewed patient records, labs, notes, testing and imaging myself where available.  Lab Results  Component Value Date   WBC 8.5 05/30/2023   HGB 14.8 05/30/2023   HCT 45.4 05/30/2023   MCV 94.5 05/30/2023   PLT 226.0 05/30/2023      Component Value Date/Time   NA 136 05/30/2023 1044   NA 137 03/05/2020 1343   K 4.4 05/30/2023 1044   CL 102 05/30/2023 1044   CO2 26 05/30/2023 1044   GLUCOSE 119 (H) 05/30/2023 1044   BUN 13 05/30/2023 1044   BUN 14 03/05/2020 1343   CREATININE 0.79 05/30/2023 1044   CREATININE 0.85 05/16/2023 1005   CALCIUM 9.5 05/30/2023 1044   PROT 7.1 05/30/2023 1044   PROT 6.9 04/20/2019 0807   ALBUMIN 4.0 05/30/2023 1044   ALBUMIN 4.3 04/20/2019 0807   AST 15 05/30/2023 1044  ALT 17 05/30/2023 1044    ALKPHOS 71 05/30/2023 1044   BILITOT 0.5 05/30/2023 1044   BILITOT 0.5 04/20/2019 0807   GFRNONAA >60 05/09/2023 1140   GFRAA 81 03/05/2020 1343   Lab Results  Component Value Date   CHOL 158 03/22/2023   HDL 26 (L) 03/22/2023   LDLCALC 73 03/22/2023   LDLDIRECT 80.0 12/30/2021   TRIG 366 (H) 03/22/2023   CHOLHDL 6.1 (H) 03/22/2023   Lab Results  Component Value Date   HGBA1C 5.7 03/01/2023   Lab Results  Component Value Date   VITAMINB12 832 09/02/2021   Lab Results  Component Value Date   TSH 1.232 01/16/2021    Head CT 08/26/21:  1. No CT evidence for acute intracranial abnormality. 2. Atrophy and chronic small vessel ischemic changes of the white matter  MRI Brain 08/25/21 1. No acute intracranial abnormality.  2. Findings of chronic small vessel ischemia and generalized cerebral volume loss  Lumbar spine MRI 12/11/21 1.  At L2-L3, there is severe loss of disc height and other degenerative change causing moderately severe right lateral recess stenosis and moderate right foraminal narrowing.  There is potential for right L3 nerve root compression.  No spinal stenosis. 2.  At L3-L4, there is severe loss of disc height and other degenerative change causing moderately severe left lateral recess stenosis with potential for left L4 nerve root compression. 3.  At L4-L5, there are degenerative changes including severe facet hypertrophy causing mild spinal stenosis in the transverse diameter and moderately severe left foraminal and left lateral recess stenosis.  There is potential for left L4 or L5 nerve root compression. 4.  Milder degenerative changes at L1-L2 and L5-S1 do not lead to spinal stenosis or nerve root compression.   LTM EEG  This study is within normal limits. 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 year old male with multiple medical conditions including hypertension, hyperlipidemia, atrial fibrillation, COPD, pulmonary fibrosis who is presenting for follow up for his neuropathic pain and cognitive impairment.  Doing well with Pregabalin but his symptoms do get worse at night to the point that he has to get up and walk around. We will continue the patient on the same dose of Pregabalin and also give him a trial of Ropinirole in the case he has some Restless leg syndrome on top of his neuropathy. They will contact me in a week with updates. I will see him in 6 months for follow up. Advise him to increase exercise as it will help him with his cognitive impairment.     1. Peripheral polyneuropathy   2. Small fiber neuropathy   3. RLS (restless legs syndrome)   4. Mild cognitive impairment     Patient Instructions  Continue current medications  Trial of Ropinirole nightly. Please call in a week with updates Will check Iron level  Increase exercise, at least 20 minutes daily, 5 days a week.  Return in 6 months or sooner if worse   Orders Placed This Encounter  Procedures   Iron, TIBC and Ferritin Panel    Meds ordered this encounter  Medications   DISCONTD: rOPINIRole (REQUIP) 0.5 MG tablet    Sig: Take 1 tablet (0.5 mg total) by mouth at bedtime.    Dispense:  30 tablet    Refill:  0   zolpidem (AMBIEN)  5 MG tablet    Sig: Take 1 tablet (5 mg total) by mouth at bedtime.    Dispense:  30 tablet    Refill:  5    Return in about 6 months (around 12/22/2023).   Windell Norfolk, MD 06/22/2023, 10:59 AM  [facility] [address] [place], [place] [address] [phone]

## 2023-06-23 LAB — IRON,TIBC AND FERRITIN PANEL
Ferritin: 60 ng/mL (ref 30–400)
Iron Saturation: 30 % (ref 15–55)
Iron: 88 ug/dL (ref 38–169)
Total Iron Binding Capacity: 297 ug/dL (ref 250–450)
UIBC: 209 ug/dL (ref 111–343)

## 2023-06-23 NOTE — Telephone Encounter (Signed)
 I called and spoke with the pt's spouse ok per DPR and notified of results from MR   She verbalized understanding

## 2023-06-26 ENCOUNTER — Encounter: Payer: Self-pay | Admitting: Neurology

## 2023-06-29 DIAGNOSIS — K080 Exfoliation of teeth due to systemic causes: Secondary | ICD-10-CM | POA: Diagnosis not present

## 2023-07-01 ENCOUNTER — Other Ambulatory Visit: Payer: Self-pay | Admitting: Behavioral Health

## 2023-07-01 DIAGNOSIS — G4700 Insomnia, unspecified: Secondary | ICD-10-CM

## 2023-07-02 ENCOUNTER — Other Ambulatory Visit: Payer: Self-pay | Admitting: Behavioral Health

## 2023-07-02 DIAGNOSIS — F411 Generalized anxiety disorder: Secondary | ICD-10-CM

## 2023-07-02 DIAGNOSIS — F32A Depression, unspecified: Secondary | ICD-10-CM

## 2023-07-08 DIAGNOSIS — Z86711 Personal history of pulmonary embolism: Secondary | ICD-10-CM | POA: Diagnosis not present

## 2023-07-08 DIAGNOSIS — I10 Essential (primary) hypertension: Secondary | ICD-10-CM | POA: Diagnosis not present

## 2023-07-08 DIAGNOSIS — A419 Sepsis, unspecified organism: Secondary | ICD-10-CM | POA: Diagnosis not present

## 2023-07-08 DIAGNOSIS — I2693 Single subsegmental pulmonary embolism without acute cor pulmonale: Secondary | ICD-10-CM | POA: Diagnosis not present

## 2023-07-10 DIAGNOSIS — I2693 Single subsegmental pulmonary embolism without acute cor pulmonale: Secondary | ICD-10-CM | POA: Diagnosis not present

## 2023-07-10 DIAGNOSIS — I10 Essential (primary) hypertension: Secondary | ICD-10-CM | POA: Diagnosis not present

## 2023-07-10 DIAGNOSIS — A419 Sepsis, unspecified organism: Secondary | ICD-10-CM | POA: Diagnosis not present

## 2023-07-12 DIAGNOSIS — H02831 Dermatochalasis of right upper eyelid: Secondary | ICD-10-CM | POA: Diagnosis not present

## 2023-07-12 DIAGNOSIS — Z961 Presence of intraocular lens: Secondary | ICD-10-CM | POA: Diagnosis not present

## 2023-07-12 DIAGNOSIS — H18413 Arcus senilis, bilateral: Secondary | ICD-10-CM | POA: Diagnosis not present

## 2023-07-12 DIAGNOSIS — H26491 Other secondary cataract, right eye: Secondary | ICD-10-CM | POA: Diagnosis not present

## 2023-07-22 ENCOUNTER — Ambulatory Visit: Admitting: Cardiology

## 2023-07-26 ENCOUNTER — Ambulatory Visit: Admitting: Cardiology

## 2023-07-27 ENCOUNTER — Encounter: Payer: Self-pay | Admitting: Cardiology

## 2023-07-27 ENCOUNTER — Ambulatory Visit: Attending: Cardiology | Admitting: Cardiology

## 2023-07-27 VITALS — BP 104/62 | HR 82 | Ht 71.0 in | Wt 205.0 lb

## 2023-07-27 DIAGNOSIS — J441 Chronic obstructive pulmonary disease with (acute) exacerbation: Secondary | ICD-10-CM

## 2023-07-27 DIAGNOSIS — I480 Paroxysmal atrial fibrillation: Secondary | ICD-10-CM

## 2023-07-27 DIAGNOSIS — I2510 Atherosclerotic heart disease of native coronary artery without angina pectoris: Secondary | ICD-10-CM | POA: Diagnosis not present

## 2023-07-27 DIAGNOSIS — E785 Hyperlipidemia, unspecified: Secondary | ICD-10-CM

## 2023-07-27 DIAGNOSIS — J84112 Idiopathic pulmonary fibrosis: Secondary | ICD-10-CM | POA: Diagnosis not present

## 2023-07-27 NOTE — Progress Notes (Signed)
 Cardiology Office Note:    Date:  07/27/2023   ID:  Robert Lang, DOB 04-22-1950, MRN [id number]  PCP:  Sylvia Everts, [name]  Cardiologist:  Ralene Burger, MD    Referring MD: Sylvia Everts, [place]   Chief Complaint  Patient presents with   Follow-up    History of Present Illness:    Robert Lang is a 73 y.o. male past medical history significant for coronary artery disease status post PTCA and stenting of LAD and RCA, atrial flutter status post ablation done in 2017, essential hypertension, dyslipidemia last intervention that was drawn in 2012 with PTCA and stenting of right coronary artery.  Remote history of pulmonary emboli he is on anticoagulation.  Comes today 2 months for follow-up overall doing fair cardiac wise well no chest pain tightness squeezing pressure burning chest, biggest complaint he has is neuropathy that bothers him a lot lower extremities.  So he cannot sleep each time he did not sleep any last night.  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 M75.00)   Allergic rhinitis due to pollen 09/03/2013   J30.1)  Formatting of this note might be different from the original. J30.1)   Allergy    Anticoagulated 07/01/2016   Anxiety    Arthritis    Atrial fibrillation (HCC)    Atrial flutter (HCC) 06/26/2015   CAD (coronary artery disease)    Chest pain 06/26/2015   COPD (chronic obstructive pulmonary disease) (HCC)    Coronary artery disease 07/06/2018   Cardiac catheterization 2017 showing 90% small diagonal branch disease   DDD (degenerative disc disease), cervical 09/03/2013   M50.90)  Formatting of this note might be different from the original. M50.90)   Depression     Dizziness 10/28/2017   Dyslipidemia, goal LDL below 70 07/06/2018   Dyspnea on exertion 10/20/2018   Dysrhythmia    Essential hypertension 07/06/2018   ETOH abuse 01/11/2019   6 pack of beer per day   Falls 10/28/2017   GERD (gastroesophageal reflux disease)    H/O amiodarone therapy 07/01/2016   Heart palpitations 01/27/2017   History of colon polyps    History of kidney stones    Hyperlipidemia    Hypertension    IPF (idiopathic pulmonary fibrosis) (HCC)    Neck pain 09/03/2013   Neuropathy 07/06/2018   Obstructive sleep apnea 08/05/2015   PLMD (periodic limb movement disorder) 11/04/2015   Polycythemia, 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 STORY: Due to alcohol / tobacco   Pure hypercholesterolemia 09/03/2013   E78.0)  Formatting of this note might be different from the original. 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/06/2018   2017    Past Surgical History:  Procedure Laterality Date   ATRIAL FIBRILLATION ABLATION  10/2015   BACK SURGERY     CARDIOVERSION  2017   CATARACT EXTRACTION Bilateral 2017   March and April 2017   CHOLECYSTECTOMY N/A 12/21/2022   Procedure: LAPAROSCOPIC CHOLECYSTECTOMY AND REMOVAL OF BILIARY DRAIN;  Surgeon: Derral Flick, MD;  Location: [facility];  Service: General;  Laterality: N/A;   COLONOSCOPY  2018  CORONARY STENT PLACEMENT  2012   CORONARY/GRAFT ACUTE MI REVASCULARIZATION N/A 01/10/2019   Procedure: CORONARY/GRAFT ACUTE MI REVASCULARIZATION;  Surgeon: Arleen Lacer, MD;  Location: [facility] INVASIVE CV LAB;  Service: Cardiovascular;  Laterality: N/A;   INGUINAL HERNIA REPAIR     over 20 years ago   IR CHOLANGIOGRAM EXISTING TUBE  09/16/2022   IR EXCHANGE BILIARY DRAIN  10/06/2022   IR PERC CHOLECYSTOSTOMY  08/30/2022   LAPAROSCOPIC APPENDECTOMY N/A 01/17/2021   Procedure: APPENDECTOMY LAPAROSCOPIC;   Surgeon: Junie Olds, MD;  Location: [facility] OR;  Service: General;  Laterality: N/A;   LEFT HEART CATH AND CORONARY ANGIOGRAPHY N/A 01/10/2019   Procedure: LEFT HEART CATH AND CORONARY ANGIOGRAPHY;  Surgeon: Arleen Lacer, MD;  Location: [facility] CV LAB;  Service: Cardiovascular;  Laterality: N/A;   LUMBAR LAMINECTOMY Bilateral 04/05/2016   L2-L5    VENTRAL HERNIA REPAIR  2018    Current Medications: Current Meds  Medication Sig   acetaminophen  (TYLENOL ) 500 MG tablet Take 1 tablet (500 mg total) by mouth every 8 (eight) hours as needed for moderate pain.   apixaban  (ELIQUIS ) 5 MG TABS tablet TAKE 1 TABLET(5 MG) BY MOUTH TWICE DAILY (Patient taking differently: Take 5 mg by mouth 2 (two) times daily.)   atorvastatin  (LIPITOR ) 80 MG tablet TAKE 1 TABLET(80 MG) BY MOUTH DAILY (Patient taking differently: Take 80 mg by mouth daily.)   buPROPion  (WELLBUTRIN  XL) 150 MG 24 hr tablet Take 1 tablet (150 mg total) by mouth daily.   busPIRone  (BUSPAR ) 15 MG tablet Take 1 tablet (15 mg total) by mouth 3 (three) times daily.   Cholecalciferol (VITAMIN D3) 50 MCG (2000 UT) TABS Take 4,000 Units by mouth 2 (two) times daily.   docusate sodium  (COLACE) 100 MG capsule Take 100 mg by mouth daily.   famotidine  (PEPCID ) 40 MG tablet Take 1 tablet (40 mg total) by mouth daily.   fexofenadine (ALLEGRA) 180 MG tablet Take 180 mg by mouth at bedtime.    finasteride  (PROSCAR ) 5 MG tablet Take 1 tablet (5 mg total) by mouth daily. (Patient taking differently: Take 5 mg by mouth at bedtime.)   lamoTRIgine  (LAMICTAL ) 100 MG tablet Take 1 tablet (100 mg total) by mouth daily.   levalbuterol  (XOPENEX ) 1.25 MG/0.5ML nebulizer solution Take 1.25 mg by nebulization every 4 (four) hours as needed for wheezing or shortness of breath.   LORazepam  (ATIVAN ) 0.5 MG tablet Take one tablet by mouth only for severe anxiety or agitation (Patient taking differently: Take 0.5 mg by mouth daily as needed for anxiety. Take one  tablet by mouth only for severe anxiety or agitation. Has not taking medication yet.)   Melatonin 10 MG TABS Take 10 mg by mouth at bedtime.   MUCINEX  600 MG 12 hr tablet TAKE 2 TABLETS BY MOUTH TWICE DAILY   Multiple Vitamins-Minerals (PRESERVISION AREDS PO) Take 1 capsule by mouth in the morning and at bedtime.   nitroGLYCERIN  (NITROSTAT ) 0.4 MG SL tablet Place 1 tablet (0.4 mg total) under the tongue every 5 (five) minutes as needed for chest pain.   ondansetron  (ZOFRAN ) 4 MG tablet Take 1 tablet (4 mg total) by mouth every 8 (eight) hours as needed for nausea or vomiting.   ondansetron  (ZOFRAN ) 4 MG tablet Take 1 tablet (4 mg total) by mouth every 8 (eight) hours as needed for nausea or vomiting.   ondansetron  (ZOFRAN ) 4 MG tablet Take 1 tablet (4 mg total) by mouth every 8 (eight) hours as  needed for nausea or vomiting.   ondansetron  (ZOFRAN -ODT) 4 MG disintegrating tablet 4mg  ODT q4 hours prn nausea/vomit (Patient taking differently: Take 4 mg by mouth every 4 (four) hours as needed for nausea or vomiting. 4mg  ODT q4 hours prn nausea/vomit)   OXYGEN  Inhale 2 L into the lungs daily.   pantoprazole  (PROTONIX ) 40 MG tablet Take 1 tablet (40 mg total) by mouth daily. Please call [phone] to schedule an office visit for more refills   Pirfenidone  (ESBRIET ) 801 MG TABS Take 1 tablet (801 mg total) by mouth 3 (three) times daily.   polyethylene glycol powder (GLYCOLAX /MIRALAX ) 17 GM/SCOOP powder Take 17 g by mouth daily.   pregabalin  (LYRICA ) 150 MG capsule TAKE 1 CAPSULE(150 MG) BY MOUTH TWICE DAILY (Patient taking differently: Take 150 mg by mouth 2 (two) times daily.)   ranolazine  (RANEXA ) 1000 MG SR tablet Take 1 tablet (1,000 mg total) by mouth 2 (two) times daily.   rOPINIRole  (REQUIP ) 0.5 MG tablet TAKE 1 TABLET(0.5 MG) BY MOUTH AT BEDTIME (Patient taking differently: Take 0.5 mg by mouth at bedtime.)   sertraline  (ZOLOFT ) 100 MG tablet TAKE 2 TABLETS(200 MG) BY MOUTH AT BEDTIME (Patient  taking differently: Take 100 mg by mouth at bedtime.)   Sodium Chloride  Flush (NORMAL SALINE FLUSH) 0.9 % SOLN Flush drain with 5 mLs daily. Discard remaining 5ml in each syringe.   traZODone  (DESYREL ) 50 MG tablet TAKE 1 TABLET(50 MG) BY MOUTH AT BEDTIME (Patient taking differently: Take 50 mg by mouth at bedtime.)   vitamin B-12 (CYANOCOBALAMIN ) 1000 MCG tablet Take 1,000 mcg by mouth daily.   vitamin E 1000 UNIT capsule Take 1,000 Units by mouth 2 (two) times daily at 8 am and 10 pm.   zolpidem  (AMBIEN ) 5 MG tablet Take 1 tablet (5 mg total) by mouth at bedtime.     Allergies:   Naproxen and Rapaflo [silodosin]   Social History   Socioeconomic History   Marital status: Married    Spouse name: Not on file   Number of children: Not on file   Years of education: Not on file   Highest education level: Not on file  Occupational History   Not on file  Tobacco Use   Smoking status: Former    Current packs/day: 0.00    Average packs/day: 1 pack/day for 30.0 years (30.0 ttl pk-yrs)    Types: Cigarettes    Start date: 01/09/1989    Quit date: 01/10/2019    Years since quitting: 4.5   Smokeless tobacco: Former  Building services engineer status: Never Used  Substance and Sexual Activity   Alcohol  use: Not Currently    Comment: 8oz of wine a day   Drug use: Never   Sexual activity: Not on file  Other Topics Concern   Not on file  Social History Narrative   Not on file   Social Drivers of Health   Financial Resource Strain: Low Risk  (12/30/2022)   Overall Financial Resource Strain (CARDIA)    Difficulty of Paying Living Expenses: Not hard at all  Food Insecurity: No Food Insecurity (12/30/2022)   Hunger Vital Sign    Worried About Running Out of Food in the Last Year: Never true    Ran Out of Food in the Last Year: Never true  Transportation Needs: No Transportation Needs (12/30/2022)   PRAPARE - Administrator, Civil Service (Medical): No    Lack of Transportation  (Non-Medical): No  Physical Activity: Insufficiently Active (  12/30/2022)   Exercise Vital Sign    Days of Exercise per Week: 3 days    Minutes of Exercise per Session: 30 min  Stress: No Stress Concern Present (12/30/2022)   [employer] of Occupational Health - Occupational Stress Questionnaire    Feeling of Stress : Not at all  Social Connections: Socially Integrated (12/30/2022)   Social Connection and Isolation Panel [NHANES]    Frequency of Communication with Friends and Family: More than three times a week    Frequency of Social Gatherings with Friends and Family: More than three times a week    Attends Religious Services: More than 4 times per year    Active Member of [employer] or Organizations: Yes    Attends Banker Meetings: Never    Marital Status: Married     Family History: The patient's family history includes Alcohol  abuse in his brother and father; Anxiety disorder in his mother; Colon cancer in his father; Depression in his brother; Throat cancer in his brother. ROS:   Please see the history of present illness.    All 14 point review of systems negative except as described per history of present illness  EKGs/Labs/Other Studies Reviewed:         Recent Labs: 10/09/2022: B Natriuretic Peptide 330.7; Magnesium 2.0 11/25/2022: Pro B Natriuretic peptide (BNP) 36.0 05/30/2023: ALT 17; BUN 13; Creatinine, Ser 0.79; Hemoglobin 14.8; Platelets 226.0; Potassium 4.4; Sodium 136  Recent Lipid Panel    Component Value Date/Time   CHOL 158 03/22/2023 0739   TRIG 366 (H) 03/22/2023 0739   HDL 26 (L) 03/22/2023 0739   CHOLHDL 6.1 (H) 03/22/2023 0739   CHOLHDL 6 12/30/2021 0944   VLDL 68.4 (H) 12/30/2021 0944   LDLCALC 73 03/22/2023 0739   LDLDIRECT 80.0 12/30/2021 0944    Physical Exam:    VS:  BP 104/62 (BP Location: Left Arm, Patient Position: Sitting)   Pulse 82   Ht 5\' 11"  (1.803 m)   Wt 205 lb (93 kg)   SpO2 95%   BMI 28.59 kg/m     Wt Readings  from Last 3 Encounters:  07/27/23 205 lb (93 kg)  06/22/23 198 lb (89.8 kg)  06/14/23 202 lb (91.6 kg)     GEN:  Well nourished, well developed in no acute distress HEENT: Normal NECK: No JVD; No carotid bruits LYMPHATICS: No lymphadenopathy CARDIAC: RRR, no murmurs, no rubs, no gallops RESPIRATORY:  Clear to auscultation without rales, wheezing or rhonchi  ABDOMEN: Soft, non-tender, non-distended MUSCULOSKELETAL:  No edema; No deformity  SKIN: Warm and dry LOWER EXTREMITIES: no swelling NEUROLOGIC:  Alert and oriented x 3 PSYCHIATRIC:  Normal affect   ASSESSMENT:    1. Coronary artery disease involving native coronary artery of native heart without angina pectoris   2. Paroxysmal atrial fibrillation (HCC)   3. COPD with exacerbation (HCC)   4. IPF (idiopathic pulmonary fibrosis) (HCC)   5. Dyslipidemia, goal LDL below 70    PLAN:    In order of problems listed above:  Coronary disease stable from that point from granulous directed medical therapy which I will continue. Paroxysmal atrial fibrillation anticoagulated denies have any palpitations stable. COPD/idiopathic pulmonary fibrosis on oxygen  stable. Dyslipidemia I did review his laboratory tests his K PN show me his LDL 73 HDL 26 this is from 03/22/2023 acceptable cholesterol which I will continue present management   Medication Adjustments/Labs and Tests Ordered: Current medicines are reviewed at length with the patient today.  Concerns  regarding medicines are outlined above.  No orders of the defined types were placed in this encounter.  Medication changes: No orders of the defined types were placed in this encounter.   Signed, Manfred Seed, MD, [facility] 07/27/2023 3:24 PM    [facility]

## 2023-07-27 NOTE — Patient Instructions (Signed)
 Medication Instructions:  Your physician recommends that you continue on your current medications as directed. Please refer to the Current Medication list given to you today.  *If you need a refill on your cardiac medications before your next appointment, please call your pharmacy*   Lab Work: None Ordered If you have labs (blood work) drawn today and your tests are completely normal, you will receive your results only by: MyChart Message (if you have MyChart) OR A paper copy in the mail If you have any lab test that is abnormal or we need to change your treatment, we will call you to review the results.   Testing/Procedures: None Ordered   Follow-Up: At [facility], you and your health needs are our priority.  As part of our continuing mission to provide you with exceptional heart care, we have created designated Provider Care Teams.  These Care Teams include your primary Cardiologist (physician) and Advanced Practice Providers (APPs -  Physician Assistants and Nurse Practitioners) who all work together to provide you with the care you need, when you need it.  We recommend signing up for the patient portal called "MyChart".  Sign up information is provided on this After Visit Summary.  MyChart is used to connect with patients for Virtual Visits (Telemedicine).  Patients are able to view lab/test results, encounter notes, upcoming appointments, etc.  Non-urgent messages can be sent to your provider as well.   To learn more about what you can do with MyChart, go to [email or web address].    Your next appointment:   6 month(s)  The format for your next appointment:   In Person  Provider:   Gypsy Balsam, MD    Other Instructions NA

## 2023-07-29 ENCOUNTER — Encounter: Payer: Self-pay | Admitting: Neurology

## 2023-08-02 ENCOUNTER — Encounter: Payer: Self-pay | Admitting: Gastroenterology

## 2023-08-02 ENCOUNTER — Ambulatory Visit: Admitting: Gastroenterology

## 2023-08-02 VITALS — BP 110/60 | HR 95 | Ht 71.0 in | Wt 206.0 lb

## 2023-08-02 DIAGNOSIS — Z800 Family history of malignant neoplasm of digestive organs: Secondary | ICD-10-CM

## 2023-08-02 DIAGNOSIS — K5903 Drug induced constipation: Secondary | ICD-10-CM

## 2023-08-02 DIAGNOSIS — R1012 Left upper quadrant pain: Secondary | ICD-10-CM | POA: Diagnosis not present

## 2023-08-02 DIAGNOSIS — T402X5A Adverse effect of other opioids, initial encounter: Secondary | ICD-10-CM | POA: Diagnosis not present

## 2023-08-02 DIAGNOSIS — K219 Gastro-esophageal reflux disease without esophagitis: Secondary | ICD-10-CM | POA: Diagnosis not present

## 2023-08-02 DIAGNOSIS — R1011 Right upper quadrant pain: Secondary | ICD-10-CM

## 2023-08-02 DIAGNOSIS — K5909 Other constipation: Secondary | ICD-10-CM

## 2023-08-02 DIAGNOSIS — Z860101 Personal history of adenomatous and serrated colon polyps: Secondary | ICD-10-CM

## 2023-08-02 DIAGNOSIS — K2970 Gastritis, unspecified, without bleeding: Secondary | ICD-10-CM

## 2023-08-02 MED ORDER — POLYETHYLENE GLYCOL 3350 17 GM/SCOOP PO POWD: 17.0000 g | Freq: Two times a day (BID) | ORAL | Status: AC

## 2023-08-02 MED ORDER — PANTOPRAZOLE SODIUM 40 MG PO TBEC: 40.0000 mg | DELAYED_RELEASE_TABLET | Freq: Every day | ORAL | 6 refills | Status: DC

## 2023-08-02 NOTE — Patient Instructions (Addendum)
 _______________________________________________________  If your blood pressure at your visit was 140/90 or greater, please contact your primary care physician to follow up on this.  _______________________________________________________  If you are age 73 or older, your body mass index should be between 23-30. Your Body mass index is 28.73 kg/m. If this is out of the aforementioned range listed, please consider follow up with your Primary Care Provider.  If you are age 53 or younger, your body mass index should be between 19-25. Your Body mass index is 28.73 kg/m. If this is out of the aformentioned range listed, please consider follow up with your Primary Care Provider.   ________________________________________________________  The [facility] providers would like to encourage you to use MYCHART to communicate with providers for non-urgent requests or questions.  Due to long hold times on the telephone, sending your provider a message by [facility] may be a faster and more efficient way to get a response.  Please allow 48 business hours for a response.  Please remember that this is for non-urgent requests.  _______________________________________________________  Plan: - Protonix  40mg  po every morning - INCREASE: Miralax  17g po twice daily  Thank you for entrusting me with your care and choosing [facility].  Dr Venice Gillis

## 2023-08-02 NOTE — Progress Notes (Signed)
 Chief Complaint: Abdominal pain.  Referring Provider:  Sylvia Everts, [name]      ASSESSMENT AND PLAN;   #1. LUQ pain. Better with Rx for constipation. Neg US , CT AP 05/2023, neg MRI 02/2021. Nl CBC, CMP, lipase  #2. GERD  #2. FH colon Ca (dad>60). H/O polyps.  #4. 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 - No need to rpt screening colon d/t multiple co-morbidities.  If any red flags, would reconsider. - D/W pt in detail      HPI:    Robert Lang is a 73 y.o. 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  Discussed the use of AI scribe software for clinical note transcription with the patient, who gave verbal consent to proceed.  History of Present Illness C/O eft-sided abdominal pain and constipation (on Oxy).  Gets better after bowel movements.  Negative CT Abdo/pelvis except for left colonic diverticulosis without diverticulitis.  He experiences left-sided abdominal pain, primarily in the left upper quadrant, which he associates with bowel movements. The left side appears larger than the right, and he finds relief after having a bowel movement, although he often has to strain to initiate it. He takes Miralax  twice daily to manage constipation, which he believes is effective. He does not currently use a stool softener but has some at home for use if needed.  He has been on continuous oxygen  therapy for the past four to five months following gallbladder surgery. His oxygen  levels typically remain around 93-95% while on oxygen  but drop to 86-87% when he removes it, such as during a shower. He also uses a CPAP machine.  He experiences significant neuropathy,  particularly affecting his sleep, and describes the pain as severe. He has a history of arthritis, which contributes to his discomfort.  His past medical history includes a heart attack and gallbladder removal. He is currently taking Eliquis , having switched from Xarelto  post-gallbladder surgery, and emphasizes the importance of not discontinuing it.  No further right-sided abdominal pain ever since cholecystectomy.    Past GI procedures: Colon 06/2021 - One 8 mm polyp in the distal ascending colon, removed with a cold snare. Resected and retrieved. - Three 4 to 6 mm polyps in the proximal sigmoid colon and in the mid sigmoid colon, removed with a cold snare. Resected and retrieved. -Bx- TA  EGD 06/2021 - Z- line irregular, 43 cm from the incisors. Biopsied. - Gastritis. Biopsied. - Minimally scalloped mucosa was found in the duodenum. Biopsied. - Bx- C/W reflux, gastric biopsies negative for HP, small bowel biopsies negative for celiac.    CT AP with contrst 05/30/2023 1. No acute abnormality or explanation for left lower quadrant pain. 2. Left colonic diverticulosis without diverticulitis. 3. Stable low-density lesion in the right lobe of the liver, previously characterized as hemangioma on MRI.   Aortic Atherosclerosis (ICD10-I70.0). CT Abdo/pelvis with contrast 04/2021 Chronic changes as described above. No acute abnormality to correspond with the given clinical history is noted  RUQ US  04/2021 -Stable hemangioma in the liver -Normal gallbladder  Ultrasound complete 03/2021 1. No acute abnormality. 2. 3 mm gallbladder polyp. No further follow-up required  MRI liver with contrast 02/22/2021 1. Small benign hemangioma  in the RIGHT hepatic lobe corresponds to indeterminate lesion on comparison CT. 2. Normal biliary tree and pancreas.  -Colonoscopy 09/2016 @[facility].  Fair prep.  Sigmoid diverticulosis.  Repeat in 5 years (2023). FH colon cancer (dad >60)  SH:  Moved here recently, taking care of grandkids.  Wife used to work for a Careers adviser. 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 M75.00)   Allergic rhinitis due to pollen 09/03/2013   J30.1)  Formatting of this note might be different from the original. J30.1)   Allergy    Anticoagulated 07/01/2016   Anxiety    Arthritis    Atrial fibrillation (HCC)    Atrial flutter (HCC) 06/26/2015   CAD (coronary artery disease)    Chest pain 06/26/2015   COPD (chronic obstructive pulmonary disease) (HCC)    Coronary artery disease 07/06/2018   Cardiac catheterization 2017 showing 90% small diagonal branch disease   DDD (degenerative disc disease), cervical 09/03/2013   M50.90)  Formatting of this note might be different from the original. M50.90)   Depression    Dizziness 10/28/2017   Dyslipidemia, goal LDL below 70 07/06/2018   Dyspnea on exertion 10/20/2018   Dysrhythmia    Essential hypertension 07/06/2018   ETOH abuse 01/11/2019   6 pack of beer per day   Falls 10/28/2017   GERD (gastroesophageal reflux disease)    H/O amiodarone therapy 07/01/2016   Heart palpitations 01/27/2017   History of colon polyps    History of kidney stones    Hyperlipidemia    Hypertension    IPF (idiopathic pulmonary fibrosis) (HCC)    Neck pain 09/03/2013   Neuropathy 07/06/2018   Obstructive sleep apnea 08/05/2015   PLMD (periodic limb movement disorder) 11/04/2015   Polycythemia, 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 STORY: Due to alcohol / tobacco   Pure hypercholesterolemia 09/03/2013   E78.0)  Formatting of this note might be different from the original. E78.0)   Screening for prostate cancer 09/03/2013   Status post  ablation of atrial flutter 07/06/2018   2017    Past Surgical History:  Procedure Laterality Date   ATRIAL FIBRILLATION ABLATION  10/2015   BACK SURGERY     CARDIOVERSION  2017   CATARACT EXTRACTION Bilateral 2017   March and April 2017   CHOLECYSTECTOMY N/A 12/21/2022   Procedure: LAPAROSCOPIC CHOLECYSTECTOMY AND REMOVAL OF BILIARY DRAIN;  Surgeon: Derral Flick, MD;  Location: [facility];  Service: General;  Laterality: N/A;   COLONOSCOPY  2018   CORONARY STENT PLACEMENT  2012   CORONARY/GRAFT ACUTE MI REVASCULARIZATION N/A 01/10/2019   Procedure: CORONARY/GRAFT ACUTE MI REVASCULARIZATION;  Surgeon: Arleen Lacer, MD;  Location: [facility];  Service: Cardiovascular;  Laterality: N/A;   INGUINAL HERNIA REPAIR     over 20 years ago   IR CHOLANGIOGRAM EXISTING TUBE  09/16/2022   IR EXCHANGE BILIARY DRAIN  10/06/2022   IR PERC CHOLECYSTOSTOMY  08/30/2022   LAPAROSCOPIC APPENDECTOMY N/A 01/17/2021   Procedure: APPENDECTOMY LAPAROSCOPIC;  Surgeon: Junie Olds, MD;  Location: [facility];  Service: General;  Laterality: N/A;   LEFT HEART CATH AND CORONARY ANGIOGRAPHY N/A 01/10/2019   Procedure: LEFT HEART CATH AND CORONARY ANGIOGRAPHY;  Surgeon: Addie Holstein,  Vashti Gentles, MD;  Location: [facility];  Service: Cardiovascular;  Laterality: N/A;   LUMBAR LAMINECTOMY Bilateral 04/05/2016   L2-L5    VENTRAL HERNIA REPAIR  2018    Family History  Problem Relation Age of Onset   Anxiety disorder Mother    Alcohol  abuse Father    Colon cancer Father    Depression Brother    Alcohol  abuse Brother    Throat cancer Brother     Social History   Tobacco Use   Smoking status: Former    Current packs/day: 0.00    Average packs/day: 1 pack/day for 30.0 years (30.0 ttl pk-yrs)    Types: Cigarettes    Start date: 01/09/1989    Quit date: 01/10/2019    Years since quitting: 4.5   Smokeless tobacco: Former  Building services engineer status: Never Used  Substance Use Topics    Alcohol  use: Not Currently    Comment: 8oz of wine a day   Drug use: Never    Current Outpatient Medications  Medication Sig Dispense Refill   acetaminophen  (TYLENOL ) 500 MG tablet Take 1 tablet (500 mg total) by mouth every 8 (eight) hours as needed for moderate pain. 100 tablet 0   apixaban  (ELIQUIS ) 5 MG TABS tablet TAKE 1 TABLET(5 MG) BY MOUTH TWICE DAILY (Patient taking differently: Take 5 mg by mouth 2 (two) times daily.) 60 tablet 5   atorvastatin  (LIPITOR ) 80 MG tablet TAKE 1 TABLET(80 MG) BY MOUTH DAILY (Patient taking differently: Take 80 mg by mouth daily.) 90 tablet 2   buPROPion  (WELLBUTRIN  XL) 150 MG 24 hr tablet Take 1 tablet (150 mg total) by mouth daily. 30 tablet 3   busPIRone  (BUSPAR ) 15 MG tablet Take 1 tablet (15 mg total) by mouth 3 (three) times daily. 90 tablet 3   Cholecalciferol (VITAMIN D3) 50 MCG (2000 UT) TABS Take 4,000 Units by mouth 2 (two) times daily.     docusate sodium  (COLACE) 100 MG capsule Take 100 mg by mouth daily.     famotidine  (PEPCID ) 40 MG tablet Take 1 tablet (40 mg total) by mouth daily. 90 tablet 3   fexofenadine (ALLEGRA) 180 MG tablet Take 180 mg by mouth at bedtime.      finasteride  (PROSCAR ) 5 MG tablet Take 1 tablet (5 mg total) by mouth daily. (Patient taking differently: Take 5 mg by mouth at bedtime.) 90 tablet 1   lamoTRIgine  (LAMICTAL ) 100 MG tablet Take 1 tablet (100 mg total) by mouth daily. 90 tablet 1   levalbuterol  (XOPENEX ) 1.25 MG/0.5ML nebulizer solution Take 1.25 mg by nebulization every 4 (four) hours as needed for wheezing or shortness of breath. 1 each 12   LORazepam  (ATIVAN ) 0.5 MG tablet Take one tablet by mouth only for severe anxiety or agitation (Patient taking differently: Take 0.5 mg by mouth daily as needed for anxiety. Take one tablet by mouth only for severe anxiety or agitation. Has not taking medication yet.) 30 tablet 1   Melatonin 10 MG TABS Take 10 mg by mouth at bedtime.     MUCINEX  600 MG 12 hr tablet TAKE 2  TABLETS BY MOUTH TWICE DAILY 120 tablet 0   Multiple Vitamins-Minerals (PRESERVISION AREDS PO) Take 1 capsule by mouth in the morning and at bedtime.     nitroGLYCERIN  (NITROSTAT ) 0.4 MG SL tablet Place 1 tablet (0.4 mg total) under the tongue every 5 (five) minutes as needed for chest pain. 25 tablet 3   ondansetron  (ZOFRAN ) 4  MG tablet Take 1 tablet (4 mg total) by mouth every 8 (eight) hours as needed for nausea or vomiting. 20 tablet 0   OXYGEN  Inhale 2 L into the lungs daily.     pantoprazole  (PROTONIX ) 40 MG tablet Take 1 tablet (40 mg total) by mouth daily. Please call [phone] to schedule an office visit for more refills 90 tablet 1   Pirfenidone  (ESBRIET ) 801 MG TABS Take 1 tablet (801 mg total) by mouth 3 (three) times daily. 270 tablet 1   polyethylene glycol powder (GLYCOLAX /MIRALAX ) 17 GM/SCOOP powder Take 17 g by mouth daily.     pregabalin  (LYRICA ) 150 MG capsule TAKE 1 CAPSULE(150 MG) BY MOUTH TWICE DAILY (Patient taking differently: Take 150 mg by mouth 2 (two) times daily.) 60 capsule 5   ranolazine  (RANEXA ) 1000 MG SR tablet Take 1 tablet (1,000 mg total) by mouth 2 (two) times daily. 180 tablet 2   rOPINIRole  (REQUIP ) 0.5 MG tablet TAKE 1 TABLET(0.5 MG) BY MOUTH AT BEDTIME (Patient taking differently: Take 0.5 mg by mouth at bedtime.) 90 tablet 3   sertraline  (ZOLOFT ) 100 MG tablet TAKE 2 TABLETS(200 MG) BY MOUTH AT BEDTIME (Patient taking differently: Take 100 mg by mouth at bedtime.) 180 tablet 0   traZODone  (DESYREL ) 50 MG tablet TAKE 1 TABLET(50 MG) BY MOUTH AT BEDTIME (Patient taking differently: Take 50 mg by mouth at bedtime.) 90 tablet 0   vitamin B-12 (CYANOCOBALAMIN ) 1000 MCG tablet Take 1,000 mcg by mouth daily.     vitamin E 1000 UNIT capsule Take 1,000 Units by mouth 2 (two) times daily at 8 am and 10 pm.     zolpidem  (AMBIEN ) 5 MG tablet Take 1 tablet (5 mg total) by mouth at bedtime. 30 tablet 5   No current facility-administered medications for this visit.    Facility-Administered Medications Ordered in Other Visits  Medication Dose Route Frequency Provider Last Rate Last Admin   regadenoson  (LEXISCAN ) injection SOLN 0.4 mg  0.4 mg Intravenous Once Tobb, Kardie, DO       technetium tetrofosmin  (TC-MYOVIEW ) injection 31.7 millicurie  31.7 millicurie Intravenous Once PRN Tobb, Kardie, DO        Allergies  Allergen Reactions   Naproxen Other (See Comments)    (Naprosyn *ANALGESICS - ANTI-INFLAMMATORY*) Nausea, Abdominal pain   Rapaflo [Silodosin] Other (See Comments)    Low blood pressure    Review of Systems:  Psychiatric/Behavioral: No anxiety or depression.  Has been getting some forgetful.     Physical Exam:    BP 110/60   Pulse 95   Ht 5\' 11"  (1.803 m)   Wt 206 lb (93.4 kg)   SpO2 96%   BMI 28.73 kg/m  Wt Readings from Last 3 Encounters:  08/02/23 206 lb (93.4 kg)  07/27/23 205 lb (93 kg)  06/22/23 198 lb (89.8 kg)   Constitutional:  Well-developed, in no acute distress.  On home oxygen . Psychiatric: Normal mood and affect. Behavior is normal. HEENT: Pupils normal.  Conjunctivae are normal. No scleral icterus. Cardiovascular: Normal rate, regular rhythm. No edema Pulmonary/chest: Effort normal and breath sounds normal. No wheezing, rales or rhonchi.  Has significant right chest wall tenderness. Abdominal: Soft, nondistended. Bowel sounds active throughout. There are no masses palpable. No hepatomegaly.  Has significant right abdominal and chest wall tenderness. Rectal:  defered Neurological: Alert and oriented to person place and time. Skin: Skin is warm and dry. No rashes noted.  Data Reviewed: I have personally reviewed following labs and imaging studies  CBC:    Latest Ref Rng & Units 05/30/2023   10:44 AM 05/16/2023   10:05 AM 05/09/2023   11:40 AM  CBC  WBC 4.0 - 10.5 K/uL 8.5  7.1  7.0   Hemoglobin 13.0 - 17.0 g/dL 16.1  09.6  04.5   Hematocrit 39.0 - 52.0 % 45.4  45.3  47.6   Platelets 150.0 - 400.0 K/uL  226.0  180  198     CMP:    Latest Ref Rng & Units 05/30/2023   10:44 AM 05/16/2023   10:05 AM 05/09/2023   11:40 AM  CMP  Glucose 70 - 99 mg/dL 409  811  914   BUN 6 - 23 mg/dL 13  16  12    Creatinine 0.40 - 1.50 mg/dL 7.82  9.56  2.13   Sodium 135 - 145 mEq/L 136  137  132   Potassium 3.5 - 5.1 mEq/L 4.4  4.3  4.4   Chloride 96 - 112 mEq/L 102  104  101   CO2 19 - 32 mEq/L 26  24  18    Calcium  8.4 - 10.5 mg/dL 9.5  9.1  8.9   Total Protein 6.0 - 8.3 g/dL 7.1  6.6  7.3   Total Bilirubin 0.2 - 1.2 mg/dL 0.5  0.5  0.8   Alkaline Phos 39 - 117 U/L 71   67   AST 0 - 37 U/L 15  13  20    ALT 0 - 53 U/L 17  17  25       Magnus Schuller, MD 08/02/2023, 3:41 PM  Cc: [name], Edward, [name]

## 2023-08-03 DIAGNOSIS — R3912 Poor urinary stream: Secondary | ICD-10-CM | POA: Diagnosis not present

## 2023-08-03 DIAGNOSIS — N401 Enlarged prostate with lower urinary tract symptoms: Secondary | ICD-10-CM | POA: Diagnosis not present

## 2023-08-03 DIAGNOSIS — R351 Nocturia: Secondary | ICD-10-CM | POA: Diagnosis not present

## 2023-08-03 DIAGNOSIS — R3914 Feeling of incomplete bladder emptying: Secondary | ICD-10-CM | POA: Diagnosis not present

## 2023-08-03 NOTE — Telephone Encounter (Signed)
 Pt would like a callback in regards to gave medication is not working . Pt also states he has been calling and really needs to speak to Provider the patient would like call back at  [phone]

## 2023-08-04 ENCOUNTER — Other Ambulatory Visit: Payer: Self-pay | Admitting: Neurology

## 2023-08-04 MED ORDER — TRAMADOL HCL 50 MG PO TABS: 50.0000 mg | ORAL_TABLET | Freq: Every evening | ORAL | 0 refills | Status: DC

## 2023-08-04 NOTE — Progress Notes (Signed)
 Requip  not helpful for his neuropathy and restless leg syndrome, will try low dose tramadol.

## 2023-08-04 NOTE — Telephone Encounter (Signed)
 Pt calling to speak with Dr. Samara Crest. Patient said, Dr. Samara Crest told me to call and speak with him.

## 2023-08-07 DIAGNOSIS — Z86711 Personal history of pulmonary embolism: Secondary | ICD-10-CM | POA: Diagnosis not present

## 2023-08-07 DIAGNOSIS — I2693 Single subsegmental pulmonary embolism without acute cor pulmonale: Secondary | ICD-10-CM | POA: Diagnosis not present

## 2023-08-07 DIAGNOSIS — A419 Sepsis, unspecified organism: Secondary | ICD-10-CM | POA: Diagnosis not present

## 2023-08-07 DIAGNOSIS — I10 Essential (primary) hypertension: Secondary | ICD-10-CM | POA: Diagnosis not present

## 2023-08-08 ENCOUNTER — Ambulatory Visit: Payer: Medicare Other | Admitting: Behavioral Health

## 2023-08-09 DIAGNOSIS — I10 Essential (primary) hypertension: Secondary | ICD-10-CM | POA: Diagnosis not present

## 2023-08-09 DIAGNOSIS — I2693 Single subsegmental pulmonary embolism without acute cor pulmonale: Secondary | ICD-10-CM | POA: Diagnosis not present

## 2023-08-09 DIAGNOSIS — Z86711 Personal history of pulmonary embolism: Secondary | ICD-10-CM | POA: Diagnosis not present

## 2023-08-09 DIAGNOSIS — A419 Sepsis, unspecified organism: Secondary | ICD-10-CM | POA: Diagnosis not present

## 2023-08-11 ENCOUNTER — Ambulatory Visit (INDEPENDENT_AMBULATORY_CARE_PROVIDER_SITE_OTHER): Admitting: Behavioral Health

## 2023-08-11 ENCOUNTER — Encounter: Payer: Self-pay | Admitting: Behavioral Health

## 2023-08-11 ENCOUNTER — Other Ambulatory Visit: Payer: Self-pay | Admitting: Neurology

## 2023-08-11 DIAGNOSIS — R454 Irritability and anger: Secondary | ICD-10-CM

## 2023-08-11 DIAGNOSIS — F411 Generalized anxiety disorder: Secondary | ICD-10-CM | POA: Diagnosis not present

## 2023-08-11 DIAGNOSIS — G4700 Insomnia, unspecified: Secondary | ICD-10-CM

## 2023-08-11 MED ORDER — ZOLPIDEM TARTRATE 5 MG PO TABS: 5.0000 mg | ORAL_TABLET | Freq: Every day | ORAL | 5 refills | Status: DC

## 2023-08-11 MED ORDER — TRAZODONE HCL 50 MG PO TABS: ORAL_TABLET | ORAL | 0 refills | Status: DC

## 2023-08-11 MED ORDER — TRAMADOL HCL 50 MG PO TABS
50.0000 mg | ORAL_TABLET | Freq: Every evening | ORAL | 5 refills | Status: DC
Start: 2023-08-11 — End: 2023-10-05

## 2023-08-11 MED ORDER — BUSPIRONE HCL 15 MG PO TABS: 15.0000 mg | ORAL_TABLET | Freq: Three times a day (TID) | ORAL | 3 refills | Status: DC

## 2023-08-11 MED ORDER — LAMOTRIGINE 100 MG PO TABS: 100.0000 mg | ORAL_TABLET | Freq: Every day | ORAL | 1 refills | Status: AC

## 2023-08-11 MED ORDER — BUPROPION HCL ER (XL) 150 MG PO TB24: 150.0000 mg | ORAL_TABLET | Freq: Every day | ORAL | 3 refills | Status: DC

## 2023-08-11 NOTE — Progress Notes (Addendum)
 [facility]  Patient ID: Robert Lang,  MRN: [id number]  PCP: Francine Iron  Date of Evaluation: 08/11/2023 Time spent:30 minutes  Chief Complaint:  Chief Complaint   Depression; Anxiety; Follow-up; Medication Refill; Patient Education; Stress     HISTORY/CURRENT STATUS: HPI 73 yo Robert Lang male presents for follow up and medication management. Smiling and doing well. Says he has so many health issues but trying to take it one day at a time. He has oxygen  concentrator with him today. Dx of idiopathic pulmonary fibrosis.   She says his moods have been much better.  Does not want to adjust his medications this visit.  Sleep is ok but utilizing Ambien  and Trazodone . Sleeps with C-pap.  Rates anxiety 3/10 and depression 3/10. Denies hx of mania, no psychosis. No auditory or visual hallucinations. SI or HI     Individual Medical History/ Review of Systems: Changes? :No   Allergies: Naproxen and Rapaflo [silodosin]  Current Medications:  Current Outpatient Medications:    acetaminophen  (TYLENOL ) 500 MG tablet, Take 1 tablet (500 mg total) by mouth every 8 (eight) hours as needed for moderate pain., Disp: 100 tablet, Rfl: 0   apixaban  (ELIQUIS ) 5 MG TABS tablet, TAKE 1 TABLET(5 MG) BY MOUTH TWICE DAILY (Patient taking differently: Take 5 mg by mouth 2 (two) times daily.), Disp: 60 tablet, Rfl: 5   atorvastatin  (LIPITOR ) 80 MG tablet, TAKE 1 TABLET(80 MG) BY MOUTH DAILY (Patient taking differently: Take 80 mg by mouth daily.), Disp: 90 tablet, Rfl: 2   buPROPion  (WELLBUTRIN  XL) 150 MG 24 hr tablet, Take 1 tablet (150 mg total) by mouth daily., Disp: 30 tablet, Rfl: 3   busPIRone  (BUSPAR ) 15 MG tablet, Take 1 tablet (15 mg total) by mouth 3 (three) times daily., Disp: 90 tablet, Rfl: 3   Cholecalciferol (VITAMIN D3) 50 MCG (2000 UT) TABS, Take 4,000 Units by mouth 2 (two) times daily., Disp: , Rfl:    docusate sodium  (COLACE) 100 MG capsule, Take 100 mg by mouth daily.,  Disp: , Rfl:    famotidine  (PEPCID ) 40 MG tablet, Take 1 tablet (40 mg total) by mouth daily., Disp: 90 tablet, Rfl: 3   fexofenadine (ALLEGRA) 180 MG tablet, Take 180 mg by mouth at bedtime. , Disp: , Rfl:    finasteride  (PROSCAR ) 5 MG tablet, Take 1 tablet (5 mg total) by mouth daily. (Patient taking differently: Take 5 mg by mouth at bedtime.), Disp: 90 tablet, Rfl: 1   lamoTRIgine  (LAMICTAL ) 100 MG tablet, Take 1 tablet (100 mg total) by mouth daily., Disp: 90 tablet, Rfl: 1   levalbuterol  (XOPENEX ) 1.25 MG/0.5ML nebulizer solution, Take 1.25 mg by nebulization every 4 (four) hours as needed for wheezing or shortness of breath., Disp: 1 each, Rfl: 12   LORazepam  (ATIVAN ) 0.5 MG tablet, Take one tablet by mouth only for severe anxiety or agitation (Patient taking differently: Take 0.5 mg by mouth daily as needed for anxiety. Take one tablet by mouth only for severe anxiety or agitation. Has not taking medication yet.), Disp: 30 tablet, Rfl: 1   Melatonin 10 MG TABS, Take 10 mg by mouth at bedtime., Disp: , Rfl:    MUCINEX  600 MG 12 hr tablet, TAKE 2 TABLETS BY MOUTH TWICE DAILY, Disp: 120 tablet, Rfl: 0   Multiple Vitamins-Minerals (PRESERVISION AREDS PO), Take 1 capsule by mouth in the morning and at bedtime., Disp: , Rfl:    nitroGLYCERIN  (NITROSTAT ) 0.4 MG SL tablet, Place 1 tablet (0.4 mg total) under the  tongue every 5 (five) minutes as needed for chest pain., Disp: 25 tablet, Rfl: 3   ondansetron  (ZOFRAN ) 4 MG tablet, Take 1 tablet (4 mg total) by mouth every 8 (eight) hours as needed for nausea or vomiting., Disp: 20 tablet, Rfl: 0   OXYGEN , Inhale 2 L into the lungs daily., Disp: , Rfl:    pantoprazole  (PROTONIX ) 40 MG tablet, Take 1 tablet (40 mg total) by mouth daily., Disp: 30 tablet, Rfl: 6   Pirfenidone  (ESBRIET ) 801 MG TABS, Take 1 tablet (801 mg total) by mouth 3 (three) times daily., Disp: 270 tablet, Rfl: 1   polyethylene glycol powder (GLYCOLAX /MIRALAX ) 17 GM/SCOOP powder, Take 17  g by mouth 2 (two) times daily., Disp: , Rfl:    pregabalin  (LYRICA ) 150 MG capsule, TAKE 1 CAPSULE(150 MG) BY MOUTH TWICE DAILY (Patient taking differently: Take 150 mg by mouth 2 (two) times daily.), Disp: 60 capsule, Rfl: 5   ranolazine  (RANEXA ) 1000 MG SR tablet, Take 1 tablet (1,000 mg total) by mouth 2 (two) times daily., Disp: 180 tablet, Rfl: 2   sertraline  (ZOLOFT ) 100 MG tablet, TAKE 2 TABLETS(200 MG) BY MOUTH AT BEDTIME (Patient taking differently: Take 100 mg by mouth at bedtime.), Disp: 180 tablet, Rfl: 0   traMADol  (ULTRAM ) 50 MG tablet, Take 1 tablet (50 mg total) by mouth at bedtime., Disp: 30 tablet, Rfl: 5   traZODone  (DESYREL ) 50 MG tablet, TAKE 1 TABLET(50 MG) BY MOUTH AT BEDTIME, Disp: 90 tablet, Rfl: 0   vitamin B-12 (CYANOCOBALAMIN ) 1000 MCG tablet, Take 1,000 mcg by mouth daily., Disp: , Rfl:    vitamin E 1000 UNIT capsule, Take 1,000 Units by mouth 2 (two) times daily at 8 am and 10 pm., Disp: , Rfl:    zolpidem  (AMBIEN ) 5 MG tablet, Take 1 tablet (5 mg total) by mouth at bedtime., Disp: 30 tablet, Rfl: 5 No current facility-administered medications for this visit.  Facility-Administered Medications Ordered in Other Visits:    regadenoson  (LEXISCAN ) injection SOLN 0.4 mg, 0.4 mg, Intravenous, Once, Tobb, Kardie, DO   technetium tetrofosmin  (TC-MYOVIEW ) injection 31.7 millicurie, 31.7 millicurie, Intravenous, Once PRN, Tobb, Kardie, DO Medication Side Effects: none  Family Medical/ Social History: Changes? No  MENTAL HEALTH EXAM:  There were no vitals taken for this visit.There is no height or weight on file to calculate BMI.  General Appearance: Casual, Neat, and Well Groomed  Eye Contact:  NA  Speech:  Clear and Coherent  Volume:  Normal  Mood:  Anxious  Affect:  Appropriate  Thought Process:  Coherent  Orientation:  Full (Time, Place, and Person)  Thought Content: Logical   Suicidal Thoughts:  No  Homicidal Thoughts:  No  Memory:  WNL  Judgement:  Good   Insight:  Good  Psychomotor Activity:  Normal  Concentration:  Concentration: Good  Recall:  Good  Fund of Knowledge: Good  Language: Good  Assets:  Desire for Improvement  ADL's:  Intact  Cognition: WNL  Prognosis:  Good    DIAGNOSES:    ICD-10-CM   1. Generalized anxiety disorder  F41.1 busPIRone  (BUSPAR ) 15 MG tablet    2. Irritability  R45.4 lamoTRIgine  (LAMICTAL ) 100 MG tablet    3. Insomnia, unspecified type  G47.00 traZODone  (DESYREL ) 50 MG tablet    zolpidem  (AMBIEN ) 5 MG tablet      Receiving Psychotherapy: No    RECOMMENDATIONS:   Greater than 50% of 30 min face to face time with patient was spent on counseling and coordination  of care. Discussed his significant improvement with moods since last visit. His wife feels like he is in a good place. Abdominal distention has resolved.    We agreed to:   To continue Zoloft  to 200  mg daily To continue Trazodone  50 mg at bedtime To continue Buspar   to 15 mg three times daily To continue Lamictal  to 100 mg. To continue Ativan  0.5 mg once daily prn only for severe anxiety or agitation. To continue Wellbutrin  to 150 mg XL daily Will report worsening symptoms or side effects promptly To follow up in 12 weeks per pt Provided emergency contact information Discussed potential benefits, risk, and side effects of benzodiazepines to include potential risk of tolerance and dependence, as well as possible drowsiness.  Advised patient not to drive if experiencing drowsiness and to take lowest possible effective dose to minimize risk of dependence and tolerance.  Monitor for any sign of rash. Please taking Lamictal  and contact office immediately rash develops. Recommend seeking urgent medical attention if rash is severe and/or spreading quickly.  Reviewed PDMP    Lincoln Renshaw, NP

## 2023-08-17 DIAGNOSIS — G8929 Other chronic pain: Secondary | ICD-10-CM | POA: Diagnosis not present

## 2023-08-17 DIAGNOSIS — D6869 Other thrombophilia: Secondary | ICD-10-CM | POA: Diagnosis not present

## 2023-08-18 NOTE — Patient Instructions (Incomplete)
 IPF (idiopathic pulmonary fibrosis) (HCC) Medication monitoring encounter  -Pulmonary fibrosis itself appears to be stable since OCt 2023/April 2023 through September 2024 -> though I am concerned you could be worse in March 2025 -Unclear if you are worse March 2025 - Overall tolerating Esbriet well.  - LFT normal July 2024 and March 2025  plan -Continue night oxygen -Continue portable oxygen -Esbriet hold for 1 weeks for lipase elevations [see below]   Chronic intermittent lipase elevation since 2023 [started pirfenidone 2022]  Plan  - Stop pirfenidone for 1 weeks- > re check lipase -> call us for the results -> and then start pirfenidone at 1 tablet 1 time a week followed by 1 tablet 2 times a week and followed by 1 tablet 3 times a week  -Always take meals  -Spaced in 5-6 hours apart  -Apply sunscreen  Pulmonary embolism small June 2024 requirng o2 in hospital  - on 11/25/2022  - using o2 at night and only very mild exercise hypxoemia  Plan  -cotninue eliquis; duration likely 1+ years -> in sept 2025, we will check D-dimer    Acute cholecystisis s/p drain July 2024 S/p thank you cholecystectomy October 2024  Plan   -Expectant follow-up per surgery  Traveled by road to [place] in April 2026  Plan - We can address this sometime in February 2026  Lung cancer screening  Plan - February 2026 we will do a high-resolution CT chest for ILD and lung cancer screening and set of low-dose CT.  -Follow-up - 2 months Dr Marchelle Gearing; symptom score and walk at followup  -30-minute visit

## 2023-08-18 NOTE — Progress Notes (Unsigned)
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 Care Team: [name], Slater Duncan as PCP - General (Internal Medicine) Manfred Seed, MD as PCP - Cardiology (Cardiology)  This Provider for this visit: Treatment Team:  Attending Provider: Maire Scot, MD    09/16/2022 -   Chief Complaint  Patient presents with   Hospitalization Follow-up    Blood clot in lung, not feeling good, Gallbladder pain.  HPI Robert Lang 73 y.o. -returns for post hospital follow-up.  This is an unscheduled visit otherwise.  His pulm fibrosis itself is stable.  He presents with his wife.  Wife is an independent historian.  History is also gained from review of the medical records especially external medical record related to hospitalization 08/28/2022 - 09/01/2022.  He  had dual problems of subsegmental pulmonary embolism and also acute cholecystitis.  Discharged on Eliquis .  Also status post gallbladder drain.  He was on oxygen  but he is now off oxygen .  He is getting home physical therapy.  He is upset about his illness but he feels he is gaining strength.  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  He is tolerating high-dose prescription of pirfenidone  that requires intensive therapeutic monitoring well.  He had blood work 09/13/2022.  Liver function test was normal.  Hemoglobin and kidney function are normal.  Also visualized the CT scan of the chest from August 29, 2022: Agree with the pulmonary embolism and I also feel that the pulmonary fibrosis itself is stable.  With my personal independent of interpretation.    Other issues - She did see psychiatry on 09/14/2022.  His BuSpar  was increased.  Other medications noted above but continued.   10/13/2022- Interim hx  Patient presents today for hospital follow-up.  He was admitted from 10/05/2022 - 10/09/2022 for sepsis secondary to Klebsiella bacteremia.  Originally presented with abdominal  pain and decreased cholecystostomy tube output.  He was treated with IV Rocephin  and Flagyl , transition to oral Levaquin  on 10/08/2022.  Prior to this admission he had been treated for cholecystitis for close to 5 weeks, underwent cholecystostomy drain placement first week of June.  Drain repositioned by IR on 10/06/2022.  Patient was cleared by IR and general surgery for discharge.  Will eventually need Cholecystectomy with Dr. Harman Lightning with [facility] general surgery after clearance with his primary cardiologist and pulmonologist. Tentative date is mid September.   Patient follows with Dr. Bertrum Brodie for history of IPF and COPD. Maintained on LABA/LAMA, pirfenidone  and CPAP at bedtime. He was seen by Dr. Anthony Bateman in June and IPF felt to be stable since December  2022 based on PFTs and symptoms. Tolerating Esbriet . Patient reports that his O2 was fluctuating prior to hospitalization. He was discharged on 2L which has been approved through 2027. He has been wearing oxygen  at night but not CPAP. He needs adaptor to connect oxygen  to CPAP. Spirometry is scheduled for Sept 5th. He is on Eliquis  for history of paroxysmal A-fib and history of PE.   Airview download 09/12/2022 - 10/11/2022 Usage days 20/30 days (67%); 11 days (37%) greater than 4 hours Average usage days used 4 hours 19 minutes Pressure 5 to 20 cm H2O (9.4 cm H2O 95%) Air leaks 18.8 L/min (95%) AHI 1.0  OV 11/25/2022  Subjective:  Patient ID: Robert Lang, male , DOB: 1950-04-17 , age 72 y.o. , MRN: [id number] , ADDRESS: [address] [place] [address] PCP Sylvia Everts, [name] Patient Care Team: Francine Iron as PCP - General (Internal Medicine) Manfred Seed, MD as PCP - Cardiology (Cardiology) Billie Budge, RN as [facility] Care Management  This Provider for this visit: Treatment Team:  Attending Provider: Maire Scot, MD   11/25/2022 -   Chief Complaint  Patient presents with   Follow-up    PFT 11/25/22 SOB      HPI Robert Lang 73 y.o. -returns for follow-up.  Last seen in June 2024.  Wife is here and is an independent historian.  He is still dealing with his gallbladder issues.  His gallbladder drain is still there.  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  Since then he states he been hospitalized couple of times most recently July 2024 for sepsis related to his gallbladder.  Currently he is at home and doing physical therapy and reconditioning quite well.  He is now on oxygen  therapy at night and in the daytime.  He wants portable oxygen .  He feels his oxygen  is helping him.  His pulmonary function test appears to be stable in the last year but progressed compared to a year and  a half ago.  We did a sit/stand hypoxemia test and his pulse ox is normal on room air at rest but he does desaturate very transiently to 88% at the end of exertion.  There is a huge improvement though.  He wants a preop clearance and so to see.  I did tell him that is now 3 months since he had his blood clot and given the improvement in hypoxemia it may not be a bad time to do a cholecystectomy although he is at risk for recurrence of blood clot in the immediate days after surgery.  He understands this but he wants his drain out.  He did agree to get  a BNP checked.  Recently in July 2012 is high and a D-dimer and I will do some restratification.Robert Lang  He does want portable oxygen ..     Robert Lang 06/14/2023  Subjective:  Patient ID: Robert Lang, male , DOB: 03-21-1951 , age 14 y.o. , MRN: [id number] , ADDRESS: [address] [place] [address] PCP Sylvia Everts, [name] Patient Care Team: [name], Slater Duncan as PCP - General (Internal Medicine) Manfred Seed, MD as PCP - Cardiology (Cardiology)  This Provider for this visit: Treatment Team:  Attending Provider: Maire Scot, MD    06/14/2023 -   Chief Complaint  Patient presents with   Follow-up    Ipf, wants to discuss if he needs to be on oxygen  all the time, oxygen  levels drop when he gets up and start moving around a lot, want to take a trip out west driving, on 2 liters of oxygen , discuss results of chest xray      HPI Robert Lang 73 y.o. -returns for follow-up.  Presents with his wife.  Since I last saw him in September 2024 no admissions other than for cholecystectomy.  He states he is been doing well his physical conditioning is better but he states he is a little more short of breath and he seems to be desaturating a little more easily than in the past.  Today he desaturated doing a sit/stand 6 times [last time it was 10 times].  He did have a lung cancer screening chest in February 2025 ILD is not reported on  that but I personally visualized it and he does have ILD.  In fact he has crackles.  He is taking full dose pirfenidone  and is tolerating it well.  He brought to my attention that he is having intermittent chronic lipase elevations.  I reviewed his lab results and's been going on since 2023.  This was after he started his pirfenidone .  I did indicate to him I did see 1 IPF patient was on nintedanib have unexplained lipase elevations and it was unrelated to nintedanib.  Nevertheless we took a shared decision making to give his pirfenidone  a holiday and recheck his lipase.  He is willing to do that.  But his biggest concern is desaturations with exercise.  A year ago he did not have any pulmonary hypertension.  He says when he takes a shower his pulse ox on room air drops to 88%.  On 2 L he is around 93%.  His last pulmonary function test was in September 2024.  I could not determine if he had progressed and is CT scan because is a low-dose CT scan.     She also plans to go to [place]  next.  2026.  I told him we will discuss this later.    OV 08/19/2023  Subjective:  Patient ID: Robert Lang, male , DOB: 09-30-1950 , age 69 y.o. , MRN: [id number] , ADDRESS: [address] 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  2 antihypertensives had to be stopped -Stable high-resolution  CT chest April 2023 compared to February 2022 - LDCT Feb 2025 - LAST  High risk prescription of pirfenidone   - Esbriet /Pirfenidone  requires intensive drug monitoring due to high concerns for Adverse effects of , including  Drug Induced Liver Injury, significant GI side effects that include but not limited to Diarrhea, Nausea, Vomiting,  and other system side effects that include Fatigue, headaches, weight loss and other side effects such as skin rash. These will be monitored with  blood work such as LFT initially once a month for 6 months and then quarterly    Mild associated emphysema  -Noticed in April 2023 high-resolution CT chest and started on Stiolto  Atrial flutter status post ablation 2017  -On Xarelto   Grade 1 diastolic dysfunction  -Echocardiogram Sept 2023 (no PASP)  - August 2024: No PAP eelvation   Coronary artery disease  -On GDMT treatment  OSA CPAP pending end of 2022   Mild gait unsteadiness due to spinal issues     - uses cane  Long-term turmeric use  Lung nodules  - feb 2022:L Three scattered solid basilar left lower lobe pulmonary nodules, largest 7 mm posteromedially.   -CT chest April 2023 describes this as 4 mm subpleural nodules.   - LAst CT FEb 2025 - Lung RADS 2  History of COVID-19 Memorial Day weekend 2023   Admission early June 2023 with a syncope  -Antviral Paxlovid  considered as an etiology and dehydrtion  -MRI negative, EEG unremarkable  EKG with bifascicular block  -Cleared by neurology September 15, 2021 outpatient  -ZIO monitor placed June 2023 wounds.\  - QtcF 04/20/2022  Prolonged Qtc >  - QTcF - 04/20/2022  - QTc 478 msec on EKG June 2024 personally visualzied and itnerpreted  - Qtc 463 mosec July 2024 -    Admission 08/28/2022 - 09/01/2022  -Small subsegmental pulmonary embolism treatment with anticoagulation  -Acute cholecystitis status post cholecystostomy -October 2024  was admitted from 10/05/2022 -  10/09/2022 for sepsis secondary to Klebsiella bacteremia.   Depressive disorder, generalized anxiety disorder and insomnia  -Under the care of Marita Sidle nurse practitioner psychiatry  -On Zoloft , trazodone , BuSpar , Lamictal , lorazepam , Wellbutrin   Robert Lang 73 y.o. - HPI returns for his IPF follow-up.  Back in February 2025 he had a low-dose CT scan of the chest so we cannot make comparisons of his IPF.  But since sept 2024 noticed exercise hypoxemia < 88% and then in February 2025 he had a CT scan of the chest but unfortunate was low-dose CT scan of the chest.  He ise is now complaining of ongoing persistent shortness of breath with exertion relieved by rest.  He also states when he is without oxygen  for 30 minutes at rest he will feel tired and short of breath and will need to put the oxygen .  He does not check his pulse ox.  Nevertheless he feels he needs his oxygen .  He is frustrated he is not able to come off oxygen .  He also states that for his morning routine such as taking a shower or shaving and changing clothes he is has to break it into 3 phases this because of increasing shortness of breath.  He did have pulmonary function test today that shows a decline compared to September 2024.  This fits  in with the progressive hypoxemia he has had starting around September 2024.  We currently do not have any other treatment options but in a few months we believe that a new medication called NERANDRALIMILAST will be approved.  This has some amount of diarrhea.  I did discuss that we will discuss this with him at the time of follow-up.  Meanwhile we will refer him to pulm rehabilitation.  Last echo was in August 2024.  Will also get a repeat echo at the time of follow-up.  Independent of this he has multifactoral dyspnea on exertion because of his obesity and physical deconditioning.  He is agreed to go to pulmonary rehabilitation for this.                    SYMPTOM SCALE - ILD 06/17/2020    07/28/2020   09/09/2020 184# 11/18/2020 185#  Esbriet  801mg  BID 12/19/2020   Esbriet  801mg  TID  02/19/2021 187# 06/23/2021 180# 07/14/2021 Esbriet  801mg  TID  09/24/2021 188# - esbreit and also stiolto 01/07/2022 206# 04/20/2022 200# esbriet  11/25/2022 191# - esbriet  06/14/2023 202#  O2 use ra RA     RA   ra0 RA  ra ra ra ra ra  Shortness of Breath 0 -> 5 scale with 5 being worst (score 6 If unable to do) 0-5               At rest 3 0.5 2 4 3   0 3 2 3 3 3 3   Simple tasks - showers, clothes change, eating, shaving 5 3 3 3 2  3 3 4 4 2 4 5   Household (dishes, doing bed, laundry) 5 3 (he does not do these tasks) 5 0 3 (he does not do these tasks)  0 NA 4 5 2 4  x  Shopping 5 3 (he does not do this often) 3 1 3  (he does not do these tasks)  4 3.5 (uses scooter) 4 5 2 3 5   Walking level at own pace 4 3 3 1 3  5 3 3 3 2 3 5   Walking up Stairs 5 5 4 1 3  5  3.5 4 5 2 4 5   Total (30-36) Dyspnea Score 27 17.5 20 10 17  17 16 21 25 13 21 23   How bad is your cough? 3 2 1  fair 1  0 1 2 2 2 3 2   How bad is your fatigue 4 2 4  fair 3  3 4 3 5 2 4 5   How bad is nausea 0 0 3 good 2  3 2 2 2  0 3 1  How bad is vomiting?   0 0 0 good 0  0 0 0 0 0 0 0  How bad is diarrhea? 3 0 0 good 0  0 0 1 2 0 0 1  How bad is anxiety? 5 5 3  fair 2  5 5 3 5 3 4 5   How bad is depression 5 5 3  faor 3  3 4 3 4 3 4 4          Simple office walk 185 feet x  3 laps goal with forehead probe 06/23/2021  09/24/2021  01/07/2022  09/16/2022   ra  Sit stand x 5 with cane  stead  93% and 102/min  92% and 109/min  no  no  yes  x  x   11/25/2022  06/14/2023   O2 used ra ra ra  ra ra  Number laps completed  2 of 3 Did all 3 withe cane Did all 3 with cane  Sit stand x 10 Sit stand x 15  Comments about pace Not done Slow pace with cane Slow pace - stopped 2-3 times in fist lap  Good pace Sopped at 6 got tired  Resting Pulse Ox/HR 98% and 97/min 99% and HR 102 99% and HR 86  96% and HR 92 96% nahd HR 85  Final Pulse Ox/HR 97% and  101/min 99% and HR 112 98% and HR 100  88% and HR 101 85% and HR 93  Desaturated </= 88% no no no     Desaturated <= 3% points no no no     Got Tachycardic >/= 90/min yes Ye even at rest yes     Symptoms at end of test Not rcorded Modrate dyspnea Moderate to severe dyspnea  7 of 10 dyspnea Very winded  Miscellaneous comments x           PFT     Latest Ref Rng & Units 08/19/2023    7:56 AM 11/25/2022    8:56 AM 04/20/2022    9:18 AM 01/07/2022    8:30 AM 06/23/2021    9:23 AM 02/19/2021   11:47 AM 11/18/2020    9:04 AM  PFT Results  FVC-Pre L 2.79  P 3.08  3.11  3.05  3.48  3.49  3.33   FVC-Predicted Pre % 62  P 68  68  66  76  75  72   Pre FEV1/FVC % % 74  P 76  75  73  76  73  79   FEV1-Pre L 2.06  P 2.34  2.34  2.21  2.64  2.56  2.63   FEV1-Predicted Pre % 62  P 70  70  66  79  75  77   DLCO uncorrected ml/min/mmHg 15.84  P 17.72  17.01  16.62  17.94  20.29  19.95   DLCO UNC% % 60  P 67  64  62  67  75  74   DLCO corrected ml/min/mmHg  18.88  17.01  16.62  17.94  20.29  19.95   DLCO COR %Predicted %  71  64  62  67  75  74   DLVA Predicted % 80  P 82  74  74  79  81  79     P Preliminary result       LAB RESULTS last 96 hours No results found.       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), Allergic rhinitis due to pollen (09/03/2013), Allergy, Anticoagulated (07/01/2016), Anxiety, Arthritis, Atrial fibrillation (HCC), Atrial flutter (HCC) (06/26/2015), CAD (coronary artery disease), Chest pain (06/26/2015), COPD (chronic obstructive pulmonary disease) (HCC), Coronary artery disease (07/06/2018), DDD (degenerative disc disease), cervical (09/03/2013), Depression, Dizziness (10/28/2017), Dyslipidemia, goal LDL below 70 (07/06/2018), Dyspnea on exertion (10/20/2018), Dysrhythmia, Essential hypertension (07/06/2018), ETOH abuse (01/11/2019), Falls  (10/28/2017), GERD (gastroesophageal reflux disease), H/O amiodarone therapy (07/01/2016), Heart palpitations (01/27/2017), History of colon polyps, History of kidney stones, Hyperlipidemia, Hypertension, IPF (idiopathic pulmonary fibrosis) (HCC), Neck pain (09/03/2013), Neuropathy (07/06/2018), Obstructive sleep apnea (08/05/2015), PLMD (periodic limb movement disorder) (11/04/2015), Polycythemia, 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   reports that he quit smoking about 4 years ago. His smoking use included cigarettes. He started smoking about  34 years ago. He has a 30 pack-year smoking history. He has quit using smokeless tobacco.  Past Surgical History:  Procedure Laterality Date   ATRIAL FIBRILLATION ABLATION  10/2015   BACK SURGERY     CARDIOVERSION  2017   CATARACT EXTRACTION Bilateral 2017   March and April 2017   CHOLECYSTECTOMY N/A 12/21/2022   Procedure: LAPAROSCOPIC CHOLECYSTECTOMY AND REMOVAL OF BILIARY DRAIN;  Surgeon: Dorrie Gaudier Alphonso Aschoff, MD;  Location: [facility];  Service: General;  Laterality: N/A;   COLONOSCOPY  2018   CORONARY STENT PLACEMENT  2012   CORONARY/GRAFT ACUTE MI REVASCULARIZATION N/A 01/10/2019   Procedure: CORONARY/GRAFT ACUTE MI REVASCULARIZATION;  Surgeon: Arleen Lacer, MD;  Location: [facility] INVASIVE CV LAB;  Service: Cardiovascular;  Laterality: N/A;   INGUINAL HERNIA REPAIR     over 20 years ago   IR CHOLANGIOGRAM EXISTING TUBE  09/16/2022   IR EXCHANGE BILIARY DRAIN  10/06/2022   IR PERC CHOLECYSTOSTOMY  08/30/2022   LAPAROSCOPIC APPENDECTOMY N/A 01/17/2021   Procedure: APPENDECTOMY LAPAROSCOPIC;  Surgeon: Junie Olds, MD;  Location: [facility] OR;  Service: General;  Laterality: N/A;   LEFT HEART CATH AND CORONARY ANGIOGRAPHY N/A 01/10/2019   Procedure: LEFT HEART CATH AND CORONARY ANGIOGRAPHY;  Surgeon: Arleen Lacer, MD;   Location: [facility] INVASIVE CV LAB;  Service: Cardiovascular;  Laterality: N/A;   LUMBAR LAMINECTOMY Bilateral 04/05/2016   L2-L5    VENTRAL HERNIA REPAIR  2018    Allergies  Allergen Reactions   Naproxen Other (See Comments)    (Naprosyn *ANALGESICS - ANTI-INFLAMMATORY*) Nausea, Abdominal pain   Rapaflo [Silodosin] Other (See Comments)    Low blood pressure    Immunization History  Administered Date(s) Administered   Influenza Split 01/09/2010, 02/09/2011   Influenza, High Dose Seasonal PF 11/15/2018, 01/11/2020, 12/27/2020, 12/24/2021   Influenza,inj,Quad PF,6+ Mos 01/01/2015   Influenza-Unspecified 01/30/2014, 02/02/2016, 12/15/2020   PFIZER(Purple Top)SARS-COV-2 Vaccination 04/27/2019, 05/18/2019, 01/14/2020, 07/22/2020, 12/15/2020, 12/24/2021   Pneumococcal Conjugate-13 11/23/2018   Pneumococcal Polysaccharide-23 08/12/2020   Respiratory Syncytial Virus Vaccine,Recomb Aduvanted(Arexvy) 12/17/2021   Tdap 02/18/2011    Family History  Problem Relation Age of Onset   Anxiety disorder Mother    Alcohol  abuse Father    Colon cancer Father    Depression Brother    Alcohol  abuse Brother    Throat cancer Brother      Current Outpatient Medications:    acetaminophen  (TYLENOL ) 500 MG tablet, Take 1 tablet (500 mg total) by mouth every 8 (eight) hours as needed for moderate pain., Disp: 100 tablet, Rfl: 0   apixaban  (ELIQUIS ) 5 MG TABS tablet, TAKE 1 TABLET(5 MG) BY MOUTH TWICE DAILY (Patient taking differently: Take 5 mg by mouth 2 (two) times daily.), Disp: 60 tablet, Rfl: 5   atorvastatin  (LIPITOR ) 80 MG tablet, TAKE 1 TABLET(80 MG) BY MOUTH DAILY (Patient taking differently: Take 80 mg by mouth daily.), Disp: 90 tablet, Rfl: 2   buPROPion  (WELLBUTRIN  XL) 150 MG 24 hr tablet, Take 1 tablet (150 mg total) by mouth daily., Disp: 30 tablet, Rfl: 3   busPIRone  (BUSPAR ) 15 MG tablet, Take 1 tablet (15 mg total) by mouth 3 (three) times daily., Disp: 90 tablet, Rfl: 3   Cholecalciferol  (VITAMIN D3) 50 MCG (2000 UT) TABS, Take 4,000 Units by mouth 2 (two) times daily., Disp: , Rfl:    docusate sodium  (COLACE) 100 MG capsule, Take 100 mg by mouth daily., Disp: , Rfl:    famotidine  (PEPCID ) 40 MG tablet, Take 1 tablet (40  mg total) by mouth daily., Disp: 90 tablet, Rfl: 3   fexofenadine (ALLEGRA) 180 MG tablet, Take 180 mg by mouth at bedtime. , Disp: , Rfl:    finasteride  (PROSCAR ) 5 MG tablet, Take 1 tablet (5 mg total) by mouth daily. (Patient taking differently: Take 5 mg by mouth at bedtime.), Disp: 90 tablet, Rfl: 1   lamoTRIgine  (LAMICTAL ) 100 MG tablet, Take 1 tablet (100 mg total) by mouth daily., Disp: 90 tablet, Rfl: 1   levalbuterol  (XOPENEX ) 1.25 MG/0.5ML nebulizer solution, Take 1.25 mg by nebulization every 4 (four) hours as needed for wheezing or shortness of breath., Disp: 1 each, Rfl: 12   LORazepam  (ATIVAN ) 0.5 MG tablet, Take one tablet by mouth only for severe anxiety or agitation (Patient taking differently: Take 0.5 mg by mouth daily as needed for anxiety. Take one tablet by mouth only for severe anxiety or agitation. Has not taking medication yet.), Disp: 30 tablet, Rfl: 1   Melatonin 10 MG TABS, Take 10 mg by mouth at bedtime., Disp: , Rfl:    MUCINEX  600 MG 12 hr tablet, TAKE 2 TABLETS BY MOUTH TWICE DAILY, Disp: 120 tablet, Rfl: 0   Multiple Vitamins-Minerals (PRESERVISION AREDS PO), Take 1 capsule by mouth in the morning and at bedtime., Disp: , Rfl:    nitroGLYCERIN  (NITROSTAT ) 0.4 MG SL tablet, Place 1 tablet (0.4 mg total) under the tongue every 5 (five) minutes as needed for chest pain., Disp: 25 tablet, Rfl: 3   ondansetron  (ZOFRAN ) 4 MG tablet, Take 1 tablet (4 mg total) by mouth every 8 (eight) hours as needed for nausea or vomiting., Disp: 20 tablet, Rfl: 0   OXYGEN , Inhale 2 L into the lungs daily., Disp: , Rfl:    pantoprazole  (PROTONIX ) 40 MG tablet, Take 1 tablet (40 mg total) by mouth daily., Disp: 30 tablet, Rfl: 6   Pirfenidone  (ESBRIET ) 801  MG TABS, Take 1 tablet (801 mg total) by mouth 3 (three) times daily., Disp: 270 tablet, Rfl: 1   polyethylene glycol powder (GLYCOLAX /MIRALAX ) 17 GM/SCOOP powder, Take 17 g by mouth 2 (two) times daily., Disp: , Rfl:    pregabalin  (LYRICA ) 150 MG capsule, TAKE 1 CAPSULE(150 MG) BY MOUTH TWICE DAILY (Patient taking differently: Take 150 mg by mouth 2 (two) times daily.), Disp: 60 capsule, Rfl: 5   ranolazine  (RANEXA ) 1000 MG SR tablet, Take 1 tablet (1,000 mg total) by mouth 2 (two) times daily., Disp: 180 tablet, Rfl: 2   sertraline  (ZOLOFT ) 100 MG tablet, TAKE 2 TABLETS(200 MG) BY MOUTH AT BEDTIME (Patient taking differently: Take 100 mg by mouth at bedtime.), Disp: 180 tablet, Rfl: 0   traMADol  (ULTRAM ) 50 MG tablet, Take 1 tablet (50 mg total) by mouth at bedtime., Disp: 30 tablet, Rfl: 5   traZODone  (DESYREL ) 50 MG tablet, TAKE 1 TABLET(50 MG) BY MOUTH AT BEDTIME, Disp: 90 tablet, Rfl: 0   vitamin B-12 (CYANOCOBALAMIN ) 1000 MCG tablet, Take 1,000 mcg by mouth daily., Disp: , Rfl:    vitamin E 1000 UNIT capsule, Take 1,000 Units by mouth 2 (two) times daily at 8 am and 10 pm., Disp: , Rfl:    zolpidem  (AMBIEN ) 5 MG tablet, Take 1 tablet (5 mg total) by mouth at bedtime., Disp: 30 tablet, Rfl: 5 No current facility-administered medications for this visit.  Facility-Administered Medications Ordered in Other Visits:    regadenoson  (LEXISCAN ) injection SOLN 0.4 mg, 0.4 mg, Intravenous, Once, Tobb, Kardie, DO   technetium tetrofosmin  (TC-MYOVIEW ) injection 31.7 millicurie, 31.7 millicurie,  Intravenous, Once PRN, Tobb, Kardie, DO      Objective:   Vitals:   08/19/23 1032  BP: 110/66  Pulse: 84  SpO2: 94%  Weight: 209 lb 3.2 oz (94.9 kg)  Height: 5\' 11"  (1.803 m)    Estimated body mass index is 29.18 kg/m as calculated from the following:   Height as of this encounter: 5\' 11"  (1.803 m).   Weight as of this encounter: 209 lb 3.2 oz (94.9 kg).  @WEIGHTCHANGE @  Filed Weights   08/19/23  1032  Weight: 209 lb 3.2 oz (94.9 kg)     Physical Exam   General: No distress. Lookls well O2 at rest: yes and no Cane present: yes Sitting in wheel chair: no Frail: no Obese: no Neuro: Alert and Oriented x 3. GCS 15. Speech normal Psych: Pleasant Resp:  Barrel Chest - o.  Wheeze - no, Crackles - mild, No overt respiratory distress CVS: Normal heart sounds. Murmurs - no Ext: Stigmata of Connective Tissue Disease - no HEENT: Normal upper airway. PEERL +. No post nasal drip        Assessment:       ICD-10-CM   1. IPF (idiopathic pulmonary fibrosis) (HCC)  J84.112 Hepatic function panel    AMB referral to pulmonary rehabilitation    Hepatic function panel    Pulmonary function test    ECHOCARDIOGRAM COMPLETE    2. Medication monitoring encounter  Z51.81 Hepatic function panel    AMB referral to pulmonary rehabilitation    Hepatic function panel    Pulmonary function test    ECHOCARDIOGRAM COMPLETE    3. Serum lipase elevation  R74.8 Hepatic function panel    AMB referral to pulmonary rehabilitation    Hepatic function panel    Pulmonary function test    ECHOCARDIOGRAM COMPLETE    4. History of pulmonary embolism  Z86.711 Hepatic function panel    AMB referral to pulmonary rehabilitation    Hepatic function panel    Pulmonary function test    ECHOCARDIOGRAM COMPLETE    5. Screening for lung cancer  Z12.2 Hepatic function panel    AMB referral to pulmonary rehabilitation    Hepatic function panel    Pulmonary function test    ECHOCARDIOGRAM COMPLETE    6. Exercise hypoxemia  R09.02 Hepatic function panel    AMB referral to pulmonary rehabilitation    Hepatic function panel    Pulmonary function test    ECHOCARDIOGRAM COMPLETE         Plan:     Patient Instructions  IPF (idiopathic pulmonary fibrosis) (HCC) Medication monitoring encounter   IPF progressive over time but clinically stable  on PFT OCt 2023 -> May 2025 - Overall tolerating Esbriet   well.  - LFT normal July 2024 and March 2025  plan -Continue night oxygen  -Continue portable oxygen  -Cotninue esbrtit as before - addendum: at followup consider NErandilomlast given PFT decline  Shortness of breath  - multiple reasons including IPF, physical deconditioning, and weight  Plan  - refer pulmonary rehab - Get echo at follow-up in 3 months   Chronic intermittent lipase elevation since 2023 [started pirfenidone  2022] - still elevated April 2025 even with esbriet  holoiday  Plan  - per GI doctor   Pulmonary embolism small June 2024 requirng o2 in hospital  - on 11/25/2022  - using o2 at night and only very mild exercise hypxoemia  Plan  -cotninue eliquis ; duration likely 1+ years -> in sept 2025, we will check D-dimer -  Get echocardiogram at the time of follow-up in 3 months    Acute cholecystisis s/p drain July 2024 S/p thank you cholecystectomy October 2024  Plan   -Expectant follow-up per surgery    Lung cancer screening  Plan - February 2026 we will do a high-resolution CT chest for ILD and lung cancer screening and set of low-dose CT.  -Follow-up - 3 months Dr Bertrum Brodie; symptom score and walk at followup  -30 mint  - repeat PFT needed l; spirometry and DLCO  - Get repeat echo in 3 months [last August 2024]  - LFT at followup   FOLLOWUP Return in about 3 months (around 11/19/2023) for 15 min visit, with Dr Bertrum Brodie, Face to Face Visit.    Addnedum: Initially when I saw him the March 2025 PFT did not flow into the flow chart and therefore I based off my decision that he was stable based on September 24 PFT.  But in the afternoon when I was able to get updated data I figured that he is having progressive IPF.  I was able to communicate this with him and his wife over the phone.  The above plan is because of the follow-up phone call.   ( Level 05 visit E&M 2024: Estb >= 40 min visit type: on-site physical face to visit  in total care time and  counseling or/and coordination of care by this undersigned MD - Dr Maire Scot. This includes one or more of the following on this same day 08/19/2023: pre-charting, chart review, note writing, documentation discussion of test results, diagnostic or treatment recommendations, prognosis, risks and benefits of management options, instructions, education, compliance or risk-factor reduction. It excludes time spent by the CMA or office staff in the care of the patient. Actual time 40 min)   SIGNATURE    Dr. Maire Scot, M.D., F.C.C.P,  Pulmonary and Critical Care Medicine Staff Physician, [facility] Director - Interstitial Lung Disease  Program  Pulmonary Fibrosis [facility] at [facility] [place], [place], [address]  Pager: [phone], If no answer or between  15:00h - 7:00h: call [phone] Telephone: [phone]  4:17 PM 08/19/2023

## 2023-08-19 ENCOUNTER — Encounter: Payer: Self-pay | Admitting: Internal Medicine

## 2023-08-19 ENCOUNTER — Ambulatory Visit: Admitting: Internal Medicine

## 2023-08-19 ENCOUNTER — Ambulatory Visit (HOSPITAL_BASED_OUTPATIENT_CLINIC_OR_DEPARTMENT_OTHER): Admitting: Internal Medicine

## 2023-08-19 VITALS — BP 110/66 | HR 84 | Ht 71.0 in | Wt 209.2 lb

## 2023-08-19 DIAGNOSIS — Z5181 Encounter for therapeutic drug level monitoring: Secondary | ICD-10-CM

## 2023-08-19 DIAGNOSIS — J84112 Idiopathic pulmonary fibrosis: Secondary | ICD-10-CM

## 2023-08-19 DIAGNOSIS — Z86711 Personal history of pulmonary embolism: Secondary | ICD-10-CM | POA: Diagnosis not present

## 2023-08-19 DIAGNOSIS — R748 Abnormal levels of other serum enzymes: Secondary | ICD-10-CM

## 2023-08-19 DIAGNOSIS — Z87891 Personal history of nicotine dependence: Secondary | ICD-10-CM

## 2023-08-19 DIAGNOSIS — R0902 Hypoxemia: Secondary | ICD-10-CM

## 2023-08-19 DIAGNOSIS — Z122 Encounter for screening for malignant neoplasm of respiratory organs: Secondary | ICD-10-CM

## 2023-08-19 LAB — PULMONARY FUNCTION TEST
DL/VA % pred: 80 %
DL/VA: 3.21 ml/min/mmHg/L
DLCO unc % pred: 60 %
DLCO unc: 15.84 ml/min/mmHg
FEF 25-75 Pre: 1.31 L/s
FEF2575-%Pred-Pre: 54 %
FEV1-%Pred-Pre: 62 %
FEV1-Pre: 2.06 L
FEV1FVC-%Pred-Pre: 101 %
FEV6-%Pred-Pre: 64 %
FEV6-Pre: 2.72 L
FEV6FVC-%Pred-Pre: 103 %
FVC-%Pred-Pre: 62 %
FVC-Pre: 2.79 L
Pre FEV1/FVC ratio: 74 %
Pre FEV6/FVC Ratio: 98 %

## 2023-08-19 LAB — HEPATIC FUNCTION PANEL
ALT: 22 U/L (ref 0–53)
AST: 19 U/L (ref 0–37)
Albumin: 4.2 g/dL (ref 3.5–5.2)
Alkaline Phosphatase: 65 U/L (ref 39–117)
Bilirubin, Direct: 0.1 mg/dL (ref 0.0–0.3)
Total Bilirubin: 0.5 mg/dL (ref 0.2–1.2)
Total Protein: 7.2 g/dL (ref 6.0–8.3)

## 2023-08-19 NOTE — Progress Notes (Signed)
 Spiro and DLCO perrformed today

## 2023-08-19 NOTE — Patient Instructions (Signed)
 Spiro and DLCO perrformed today

## 2023-08-21 ENCOUNTER — Other Ambulatory Visit: Payer: Self-pay | Admitting: Cardiology

## 2023-08-22 ENCOUNTER — Telehealth: Payer: Self-pay | Admitting: *Deleted

## 2023-08-22 NOTE — Telephone Encounter (Signed)
 Copied from CRM [id number]. Topic: General - Other >> Aug 22, 2023  9:44 AM Eveleen Hinds B wrote: Reason for CRM: Patient called to schedule PFT test for 3 months out. Unable to schedule as directions state do spiro??? Please call patient to schedule.  Pt needs spiro/dlco- routing to admin for for scheduling

## 2023-08-22 NOTE — Telephone Encounter (Signed)
 Attempted to call patient. Left voicemail for patient to give our office a call to get scheduled for PFT.

## 2023-08-25 DIAGNOSIS — K080 Exfoliation of teeth due to systemic causes: Secondary | ICD-10-CM | POA: Diagnosis not present

## 2023-08-26 NOTE — Telephone Encounter (Signed)
 Appointment scheduled.

## 2023-09-01 ENCOUNTER — Telehealth (HOSPITAL_COMMUNITY): Payer: Self-pay

## 2023-09-01 DIAGNOSIS — K080 Exfoliation of teeth due to systemic causes: Secondary | ICD-10-CM | POA: Diagnosis not present

## 2023-09-01 NOTE — Telephone Encounter (Signed)
 Pt insurance is active and benefits verified through [facility]. Co-pay $15, DED $0/$0 met, out of pocket $3,500/$675.18 met, co-insurance 0%. No pre-authorization required. 09/01/2023 @ 1:46pm, spoke with Kenney Peacemaker, REF# [id number].

## 2023-09-02 ENCOUNTER — Telehealth (HOSPITAL_COMMUNITY): Payer: Self-pay

## 2023-09-02 NOTE — Telephone Encounter (Signed)
 Patient's wife called back to schedule for the  Pulmonary Rehab Program. Patient will come in for orientation on 06/18 and will attend the 10:15 exercise class.  Sent MyChart message.

## 2023-09-06 ENCOUNTER — Telehealth (HOSPITAL_COMMUNITY): Payer: Self-pay

## 2023-09-06 NOTE — Telephone Encounter (Signed)
 Called to confirm appt. Pt confirmed appt. Instructed pt on proper footwear. Gave directions along with department number.

## 2023-09-07 ENCOUNTER — Encounter (HOSPITAL_COMMUNITY): Payer: Self-pay

## 2023-09-07 ENCOUNTER — Encounter (HOSPITAL_COMMUNITY)
Admission: RE | Admit: 2023-09-07 | Discharge: 2023-09-07 | Disposition: A | Discharge status: Home / Self Care | Source: Ambulatory Visit | Attending: Internal Medicine | Admitting: Internal Medicine

## 2023-09-07 VITALS — BP 98/60 | HR 89 | Wt 205.5 lb

## 2023-09-07 DIAGNOSIS — Z86711 Personal history of pulmonary embolism: Secondary | ICD-10-CM | POA: Diagnosis not present

## 2023-09-07 DIAGNOSIS — A419 Sepsis, unspecified organism: Secondary | ICD-10-CM | POA: Diagnosis not present

## 2023-09-07 DIAGNOSIS — I10 Essential (primary) hypertension: Secondary | ICD-10-CM | POA: Diagnosis not present

## 2023-09-07 DIAGNOSIS — J84112 Idiopathic pulmonary fibrosis: Secondary | ICD-10-CM | POA: Insufficient documentation

## 2023-09-07 DIAGNOSIS — I2693 Single subsegmental pulmonary embolism without acute cor pulmonale: Secondary | ICD-10-CM | POA: Diagnosis not present

## 2023-09-07 NOTE — Progress Notes (Signed)
 Pulmonary Rehab Orientation Physical Assessment Note    Well appearing, A&Ox4, NAD Eyes/Ears: states he is hard of hearing, no hearing aids, wears corrective glasses Lungs: Clear, rales at bases, no wheezes, rhonchi, + chronic dry cough, dyspnea on exertion, wearing 2.5L [place], Cpap nightly Heart: Regular rate rhythm, no murmurs, no rubs, no clicks Gastrointestinal: abdomin soft, + bowel sounds in all 4 quads, + weight gain, + constipation Genitourinary: WNL, pt denies s/s Extremities:  +2 pulses, grip strength equal, strong, no edema, no cyanosis, no clubbing Integumentary: pt denies any rashes, open or non healing wounds Psy/Soc: Pt currently sees therapist regularly, med compliant with psychotropics Assistive devices: Pt uses cane, rollator, electric scooter if needed, carriers portable oxygen  concentrator (POC), has home concentrator

## 2023-09-07 NOTE — Progress Notes (Signed)
 Robert Lang 73 y.o. male Pulmonary Rehab Orientation Note This patient who was referred to Pulmonary Rehab by Dr. Bertrum Brodie with the diagnosis of IPF arrived today in Cardiac and Pulmonary Rehab. He arrived ambulatory with assistive device with ataxic gait. He does carry portable oxygen . [employer] is the provider for their DME. Per patient, Mikhael uses oxygen  continuously. Color good, skin warm and dry. Patient is oriented to time and place. Patient's medical history, psychosocial health, and medications reviewed. Psychosocial assessment reveals patient lives with spouse. Robert Lang is currently retired. Patient hobbies include cooking. Patient reports his stress level is low. Areas of stress/anxiety include health. Patient does exhibit signs of depression. Signs of depression include guilt and sadness. PHQ2/9 score 1/4. Robert Lang shows good  coping skills with positive outlook on life. Offered emotional support and reassurance. Will continue to monitor. Physical assessment performed by Nurse pick: Willard Harman RN. Please see their orientation physical assessment note. Broderic reports he  does take medications as prescribed. Patient states he  follows a regular  diet. The patient would like to lose weight. Patient's weight will be monitored closely. Demonstration and practice of PLB using pulse oximeter. Robert Lang able to return demonstration satisfactorily. Safety and hand hygiene in the exercise area reviewed with patient. Robert Lang voices understanding of the information reviewed. Department expectations discussed with patient and achievable goals were set. The patient shows enthusiasm about attending the program and we look forward to working with Aimee Houseman. Mansour completed a 6 min walk test today and is scheduled to begin exercise on 09/13/23 @1015 .   1000-1200 Robert Lang, BSRT

## 2023-09-07 NOTE — Progress Notes (Signed)
 Pulmonary Individual Treatment Plan  Patient Details  Name: Robert Lang MRN: [id number] Date of Birth: 05-14-1950 Referring Provider:   Gattis Kass Pulmonary Rehab Walk Test from 09/07/2023 in [facility]  Referring Provider Ramaswamy    Initial Encounter Date:  Flowsheet Row Pulmonary Rehab Walk Test from 09/07/2023 in [facility]  Date 09/07/23    Visit Diagnosis: IPF (idiopathic pulmonary fibrosis) (HCC)  Patient's Home Medications on Admission:   Current Outpatient Medications:    acetaminophen  (TYLENOL ) 500 MG tablet, Take 1 tablet (500 mg total) by mouth every 8 (eight) hours as needed for moderate pain., Disp: 100 tablet, Rfl: 0   apixaban  (ELIQUIS ) 5 MG TABS tablet, TAKE 1 TABLET(5 MG) BY MOUTH TWICE DAILY, Disp: 60 tablet, Rfl: 5   atorvastatin  (LIPITOR ) 80 MG tablet, TAKE 1 TABLET(80 MG) BY MOUTH DAILY, Disp: 90 tablet, Rfl: 2   buPROPion  (WELLBUTRIN  XL) 150 MG 24 hr tablet, Take 1 tablet (150 mg total) by mouth daily., Disp: 30 tablet, Rfl: 3   busPIRone  (BUSPAR ) 15 MG tablet, Take 1 tablet (15 mg total) by mouth 3 (three) times daily., Disp: 90 tablet, Rfl: 3   Cholecalciferol (VITAMIN D3) 50 MCG (2000 UT) TABS, Take 4,000 Units by mouth 2 (two) times daily., Disp: , Rfl:    docusate sodium  (COLACE) 100 MG capsule, Take 100 mg by mouth daily., Disp: , Rfl:    fexofenadine (ALLEGRA) 180 MG tablet, Take 180 mg by mouth at bedtime. , Disp: , Rfl:    finasteride  (PROSCAR ) 5 MG tablet, Take 1 tablet (5 mg total) by mouth daily., Disp: 90 tablet, Rfl: 1   lamoTRIgine  (LAMICTAL ) 100 MG tablet, Take 1 tablet (100 mg total) by mouth daily., Disp: 90 tablet, Rfl: 1   levalbuterol  (XOPENEX ) 1.25 MG/0.5ML nebulizer solution, Take 1.25 mg by nebulization every 4 (four) hours as needed for wheezing or shortness of breath., Disp: 1 each, Rfl: 12   LORazepam  (ATIVAN )  0.5 MG tablet, Take one tablet by mouth only for severe anxiety or agitation, Disp: 30 tablet, Rfl: 1   MUCINEX  600 MG 12 hr tablet, TAKE 2 TABLETS BY MOUTH TWICE DAILY, Disp: 120 tablet, Rfl: 0   Multiple Vitamins-Minerals (PRESERVISION AREDS PO), Take 1 capsule by mouth in the morning and at bedtime., Disp: , Rfl:    nitroGLYCERIN  (NITROSTAT ) 0.4 MG SL tablet, Place 1 tablet (0.4 mg total) under the tongue every 5 (five) minutes as needed for chest pain., Disp: 25 tablet, Rfl: 3   ondansetron  (ZOFRAN ) 4 MG tablet, Take 1 tablet (4 mg total) by mouth every 8 (eight) hours as needed for nausea or vomiting., Disp: 20 tablet, Rfl: 0   OXYGEN , Inhale 2 L into the lungs daily., Disp: , Rfl:    pantoprazole  (PROTONIX ) 40 MG tablet, Take 1 tablet (40 mg total) by mouth daily., Disp: 30 tablet, Rfl: 6   Pirfenidone  (ESBRIET ) 801 MG TABS, Take 1 tablet (801 mg total) by mouth 3 (three) times daily., Disp: 270 tablet, Rfl: 1   polyethylene glycol powder (GLYCOLAX /MIRALAX ) 17 GM/SCOOP powder, Take 17 g by mouth 2 (two) times daily., Disp: , Rfl:    pregabalin  (LYRICA ) 150 MG capsule, TAKE 1 CAPSULE(150 MG) BY MOUTH TWICE DAILY, Disp: 60 capsule, Rfl: 5   ranolazine  (RANEXA ) 1000 MG SR tablet, Take 1 tablet (1,000 mg total) by mouth 2 (two) times daily., Disp: 180 tablet, Rfl: 2  sertraline  (ZOLOFT ) 100 MG tablet, TAKE 2 TABLETS(200 MG) BY MOUTH AT BEDTIME, Disp: 180 tablet, Rfl: 0   traMADol  (ULTRAM ) 50 MG tablet, Take 1 tablet (50 mg total) by mouth at bedtime., Disp: 30 tablet, Rfl: 5   traZODone  (DESYREL ) 50 MG tablet, TAKE 1 TABLET(50 MG) BY MOUTH AT BEDTIME, Disp: 90 tablet, Rfl: 0   vitamin B-12 (CYANOCOBALAMIN ) 1000 MCG tablet, Take 1,000 mcg by mouth daily., Disp: , Rfl:    vitamin E 1000 UNIT capsule, Take 1,000 Units by mouth 2 (two) times daily at 8 am and 10 pm., Disp: , Rfl:    zolpidem  (AMBIEN ) 5 MG tablet, Take 1 tablet (5 mg total) by mouth at bedtime., Disp: 30 tablet, Rfl: 5   famotidine   (PEPCID ) 40 MG tablet, Take 1 tablet (40 mg total) by mouth daily. (Patient not taking: Reported on 09/07/2023), Disp: 90 tablet, Rfl: 3   Melatonin 10 MG TABS, Take 10 mg by mouth at bedtime. (Patient not taking: Reported on 09/07/2023), Disp: , Rfl:  No current facility-administered medications for this encounter.  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 M75.00)   Allergic rhinitis due to pollen 09/03/2013   J30.1)  Formatting of this note might be different from the original. J30.1)   Allergy    Anticoagulated 07/01/2016   Anxiety    Arthritis    Atrial fibrillation (HCC)    Atrial flutter (HCC) 06/26/2015   CAD (coronary artery disease)    Chest pain 06/26/2015   COPD (chronic obstructive pulmonary disease) (HCC)    Coronary artery disease 07/06/2018   Cardiac catheterization 2017 showing 90% small diagonal branch disease   DDD (degenerative disc disease), cervical 09/03/2013   M50.90)  Formatting of this note might be different from the original. M50.90)   Depression    Dizziness 10/28/2017   Dyslipidemia, goal LDL below 70 07/06/2018   Dyspnea on exertion 10/20/2018   Dysrhythmia    Essential hypertension 07/06/2018   ETOH abuse 01/11/2019   6 pack of beer per day   Falls 10/28/2017   GERD (gastroesophageal reflux disease)    H/O amiodarone therapy 07/01/2016   Heart palpitations 01/27/2017   History of colon polyps    History of kidney stones    Hyperlipidemia     Hypertension    IPF (idiopathic pulmonary fibrosis) (HCC)    Neck pain 09/03/2013   Neuropathy 07/06/2018   Obstructive sleep apnea 08/05/2015   PLMD (periodic limb movement disorder) 11/04/2015   Polycythemia, 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 STORY: Due to alcohol / tobacco   Pure hypercholesterolemia 09/03/2013   E78.0)  Formatting of this note might be different from the original. 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/06/2018   2017    Tobacco Use: Social History   Tobacco Use  Smoking Status Former   Current packs/day: 0.00   Average packs/day: 1 pack/day for 30.0 years (30.0 ttl pk-yrs)  Types: Cigarettes   Start date: 01/09/1989   Quit date: 01/10/2019   Years since quitting: 4.6  Smokeless Tobacco Former    Labs: Review Flowsheet  More data exists      Latest Ref Rng & Units 12/30/2021 08/28/2022 09/17/2022 03/01/2023 03/22/2023  Labs for ITP Cardiac and Pulmonary Rehab  Cholestrol 100 - 199 mg/dL 604  - - - 540   LDL (calc) 0 - 99 mg/dL - - - - 73   Direct LDL mg/dL 98.1  - - - -  HDL-C >19 mg/dL 14.78  - - - 26   Trlycerides 0 - 149 mg/dL 295.6  - - - 213   Hemoglobin A1c 4.6 - 6.5 % 5.9  - - 5.7  -  Bicarbonate 20.0 - 28.0 mmol/L - 22.7  25.4  - -  TCO2 22 - 32 mmol/L - 24  - - -  Acid-base deficit 0.0 - 2.0 mmol/L - 1.0  - - -  O2 Saturation % - 77  77.8  - -    Capillary Blood Glucose: Lab Results  Component Value Date   GLUCAP 92 08/26/2021     Pulmonary Assessment Scores:  Pulmonary Assessment Scores     Row Name 09/07/23 1135         ADL UCSD   ADL Phase Entry     SOB Score total 82       CAT Score   CAT Score 22       mMRC Score   mMRC Score 4       UCSD: Self-administered rating of dyspnea associated with activities of daily living (ADLs) 6-point scale (0 = not at all to 5 = maximal or  unable to do because of breathlessness)  Scoring Scores range from 0 to 120.  Minimally important difference is 5 units  CAT: CAT can identify the health impairment of COPD patients and is better correlated with disease progression.  CAT has a scoring range of zero to 40. The CAT score is classified into four groups of low (less than 10), medium (10 - 20), high (21-30) and very high (31-40) based on the impact level of disease on health status. A CAT score over 10 suggests significant symptoms.  A worsening CAT score could be explained by an exacerbation, poor medication adherence, poor inhaler technique, or progression of COPD or comorbid conditions.  CAT MCID is 2 points  mMRC: mMRC (Modified Medical Research Council) Dyspnea Scale is used to assess the degree of baseline functional disability in patients of respiratory disease due to dyspnea. No minimal important difference is established. A decrease in score of 1 point or greater is considered a positive change.   Pulmonary Function Assessment:  Pulmonary Function Assessment - 09/07/23 1112       Breath   Bilateral Breath Sounds Basilar;Rales    Shortness of Breath Yes;Limiting activity          Exercise Target Goals: Exercise Program Goal: Individual exercise prescription set using results from initial 6 min walk test and THRR while considering  patient's activity barriers and safety.   Exercise Prescription Goal: Initial exercise prescription builds to 30-45 minutes a day of aerobic activity, 2-3 days per week.  Home exercise guidelines will be given to patient during program as part of exercise prescription that the participant will acknowledge.  Activity Barriers & Risk Stratification:  Activity Barriers & Cardiac Risk Stratification - 09/07/23 1103       Activity Barriers & Cardiac Risk  Stratification   Activity Barriers Deconditioning;Muscular Weakness;Shortness of Breath;Back Problems;Balance Concerns;History of  Falls;Assistive Device    Cardiac Risk Stratification Moderate          6 Minute Walk:  6 Minute Walk     Row Name 09/07/23 1203         6 Minute Walk   Phase Initial     Distance 645 feet     Walk Time 6 minutes     # of Rest Breaks 2  1:41-2:00, 4:11-4:40     MPH 1.22     METS 1.53     RPE 13     Perceived Dyspnea  1     VO2 Peak 5.35     Symptoms No     Resting HR 89 bpm     Resting BP 98/60     Resting Oxygen  Saturation  96 %     Exercise Oxygen  Saturation  during 6 min walk 93 %     Max Ex. HR 101 bpm     Max Ex. BP 122/62     2 Minute Post BP 118/64       Interval HR   1 Minute HR 101     2 Minute HR 86     3 Minute HR 88     4 Minute HR 94     5 Minute HR 91     6 Minute HR 94     2 Minute Post HR 79     Interval Heart Rate? Yes       Interval Oxygen    Interval Oxygen ? Yes     Baseline Oxygen  Saturation % 96 %     1 Minute Oxygen  Saturation % 93 %     1 Minute Liters of Oxygen  2 L     2 Minute Oxygen  Saturation % 95 %     2 Minute Liters of Oxygen  2 L     3 Minute Oxygen  Saturation % 94 %     3 Minute Liters of Oxygen  2 L     4 Minute Oxygen  Saturation % 94 %     4 Minute Liters of Oxygen  2 L     5 Minute Oxygen  Saturation % 94 %     5 Minute Liters of Oxygen  2 L     6 Minute Oxygen  Saturation % 94 %     6 Minute Liters of Oxygen  2 L     2 Minute Post Oxygen  Saturation % 96 %     2 Minute Post Liters of Oxygen  2 L        Oxygen  Initial Assessment:  Oxygen  Initial Assessment - 09/07/23 1111       Home Oxygen    Home Oxygen  Device Portable Concentrator;Home Concentrator    Sleep Oxygen  Prescription CPAP    Liters per minute 2.5    Home Exercise Oxygen  Prescription Continuous    Liters per minute 2.5    Home Resting Oxygen  Prescription Continuous    Liters per minute 2.5    Compliance with Home Oxygen  Use Yes      Initial 6 min Walk   Oxygen  Used Continuous    Liters per minute 2      Program Oxygen  Prescription   Program Oxygen   Prescription Continuous    Liters per minute 2      Intervention   Short Term Goals To learn and exhibit compliance with exercise, home and travel O2 prescription;To learn and understand importance of monitoring  SPO2 with pulse oximeter and demonstrate accurate use of the pulse oximeter.;To learn and understand importance of maintaining oxygen  saturations>88%;To learn and demonstrate proper pursed lip breathing techniques or other breathing techniques. ;To learn and demonstrate proper use of respiratory medications    Long  Term Goals Exhibits compliance with exercise, home  and travel O2 prescription;Maintenance of O2 saturations>88%;Compliance with respiratory medication;Verbalizes importance of monitoring SPO2 with pulse oximeter and return demonstration;Exhibits proper breathing techniques, such as pursed lip breathing or other method taught during program session;Demonstrates proper use of MDI's          Oxygen  Re-Evaluation:   Oxygen  Discharge (Final Oxygen  Re-Evaluation):   Initial Exercise Prescription:  Initial Exercise Prescription - 09/07/23 1200       Date of Initial Exercise RX and Referring Provider   Date 09/07/23    Referring Provider Ramaswamy    Expected Discharge Date 12/01/23      Oxygen    Oxygen  Continuous    Liters 2    Maintain Oxygen  Saturation 88% or higher      NuStep   Level 1    SPM 50    Minutes 30    METs 1.5      Prescription Details   Frequency (times per week) 2    Duration Progress to 30 minutes of continuous aerobic without signs/symptoms of physical distress      Intensity   THRR 40-80% of Max Heartrate 59-118    Ratings of Perceived Exertion 11-13    Perceived Dyspnea 0-4      Progression   Progression Continue to progress workloads to maintain intensity without signs/symptoms of physical distress.      Resistance Training   Training Prescription Yes    Weight red bands    Reps 10-15          Perform Capillary Blood  Glucose checks as needed.  Exercise Prescription Changes:   Exercise Comments:   Exercise Goals and Review:   Exercise Goals     Row Name 09/07/23 1106             Exercise Goals   Increase Physical Activity Yes       Intervention Provide advice, education, support and counseling about physical activity/exercise needs.;Develop an individualized exercise prescription for aerobic and resistive training based on initial evaluation findings, risk stratification, comorbidities and participant's personal goals.       Expected Outcomes Short Term: Attend rehab on a regular basis to increase amount of physical activity.;Long Term: Add in home exercise to make exercise part of routine and to increase amount of physical activity.;Long Term: Exercising regularly at least 3-5 days a week.       Increase Strength and Stamina Yes       Intervention Provide advice, education, support and counseling about physical activity/exercise needs.;Develop an individualized exercise prescription for aerobic and resistive training based on initial evaluation findings, risk stratification, comorbidities and participant's personal goals.       Expected Outcomes Short Term: Increase workloads from initial exercise prescription for resistance, speed, and METs.;Short Term: Perform resistance training exercises routinely during rehab and add in resistance training at home;Long Term: Improve cardiorespiratory fitness, muscular endurance and strength as measured by increased METs and functional capacity ( )       Able to understand and use rate of perceived exertion (RPE) scale Yes       Intervention Provide education and explanation on how to use RPE scale       Expected Outcomes  Short Term: Able to use RPE daily in rehab to express subjective intensity level;Long Term:  Able to use RPE to guide intensity level when exercising independently       Able to understand and use Dyspnea scale Yes       Intervention Provide  education and explanation on how to use Dyspnea scale       Expected Outcomes Short Term: Able to use Dyspnea scale daily in rehab to express subjective sense of shortness of breath during exertion;Long Term: Able to use Dyspnea scale to guide intensity level when exercising independently       Knowledge and understanding of Target Heart Rate Range (THRR) Yes       Intervention Provide education and explanation of THRR including how the numbers were predicted and where they are located for reference       Expected Outcomes Short Term: Able to state/look up THRR;Long Term: Able to use THRR to govern intensity when exercising independently;Short Term: Able to use daily as guideline for intensity in rehab       Understanding of Exercise Prescription Yes       Intervention Provide education, explanation, and written materials on patient's individual exercise prescription       Expected Outcomes Short Term: Able to explain program exercise prescription;Long Term: Able to explain home exercise prescription to exercise independently          Exercise Goals Re-Evaluation :   Discharge Exercise Prescription (Final Exercise Prescription Changes):   Nutrition:  Target Goals: Understanding of nutrition guidelines, daily intake of sodium 1500mg , cholesterol 200mg , calories 30% from fat and 7% or less from saturated fats, daily to have 5 or more servings of fruits and vegetables.  Biometrics:    Nutrition Therapy Plan and Nutrition Goals:   Nutrition Assessments:  MEDIFICTS Score Key: >=70 Need to make dietary changes  40-70 Heart Healthy Diet <= 40 Therapeutic Level Cholesterol Diet   Picture Your Plate Scores: <65 Unhealthy dietary pattern with much room for improvement. 41-50 Dietary pattern unlikely to meet recommendations for good health and room for improvement. 51-60 More healthful dietary pattern, with some room for improvement.  >60 Healthy dietary pattern, although there may be  some specific behaviors that could be improved.    Nutrition Goals Re-Evaluation:   Nutrition Goals Discharge (Final Nutrition Goals Re-Evaluation):   Psychosocial: Target Goals: Acknowledge presence or absence of significant depression and/or stress, maximize coping skills, provide positive support system. Participant is able to verbalize types and ability to use techniques and skills needed for reducing stress and depression.  Initial Review & Psychosocial Screening:  Initial Psych Review & Screening - 09/07/23 1059       Initial Review   Current issues with History of Depression;Current Stress Concerns;Current Psychotropic Meds    Source of Stress Concerns Chronic Illness;Unable to participate in former interests or hobbies;Unable to perform yard/household activities    Comments Robert Lang states he worries about his declining health most days. He is unable to manage things around the house and has to turn the chores over to his wife. Also, he is not able to participate in former hobbies like cooking anymore.      Family Dynamics   Good Support System? Yes    Comments He has great support from his wife, children and friends      Barriers   Psychosocial barriers to participate in program The patient should benefit from training in stress management and relaxation.  Screening Interventions   Interventions Encouraged to exercise;To provide support and resources with identified psychosocial needs    Expected Outcomes Short Term goal: Utilizing psychosocial counselor, staff and physician to assist with identification of specific Stressors or current issues interfering with healing process. Setting desired goal for each stressor or current issue identified.;Long Term Goal: Stressors or current issues are controlled or eliminated.;Short Term goal: Identification and review with participant of any Quality of Life or Depression concerns found by scoring the questionnaire.;Long Term goal: The  participant improves quality of Life and PHQ9 Scores as seen by post scores and/or verbalization of changes          Quality of Life Scores:  Scores of 19 and below usually indicate a poorer quality of life in these areas.  A difference of  2-3 points is a clinically meaningful difference.  A difference of 2-3 points in the total score of the Quality of Life Index has been associated with significant improvement in overall quality of life, self-image, physical symptoms, and general health in studies assessing change in quality of life.  PHQ-9: Review Flowsheet  More data exists      09/07/2023 12/30/2022 07/09/2022 12/22/2021 11/25/2020  Depression screen PHQ 2/9  Decreased Interest 0 0 0 0 0  Down, Depressed, Hopeless 1 1 0 0 1  PHQ - 2 Score 1 1 0 0 1  Altered sleeping 0 0 - - -  Tired, decreased energy 0 1 - - -  Change in appetite 0 0 - - -  Feeling bad or failure about yourself  1 0 - - -  Trouble concentrating 1 0 - - -  Moving slowly or fidgety/restless 1 0 - - -  Suicidal thoughts 0 0 - - -  PHQ-9 Score 4 2 - - -  Difficult doing work/chores Somewhat difficult Not difficult at all - - -   Interpretation of Total Score  Total Score Depression Severity:  1-4 = Minimal depression, 5-9 = Mild depression, 10-14 = Moderate depression, 15-19 = Moderately severe depression, 20-27 = Severe depression   Psychosocial Evaluation and Intervention:  Psychosocial Evaluation - 09/07/23 1142       Psychosocial Evaluation & Interventions   Interventions Stress management education;Relaxation education;Encouraged to exercise with the program and follow exercise prescription    Comments Robert Lang states he worries about his declining health most days. He is unable to manage things around the house and has to turn the chores over to his wife. Also, he is not able to participate in former hobbies like cooking anymore. He is currently working with a mental health therapist and takes psychotropic  meds. Staff will provide education and resources on ways to reduce stress.    Expected Outcomes For pt to participate in PR with less stress    Continue Psychosocial Services  No Follow up required          Psychosocial Re-Evaluation:   Psychosocial Discharge (Final Psychosocial Re-Evaluation):   Education: Education Goals: Education classes will be provided on a weekly basis, covering required topics. Participant will state understanding/return demonstration of topics presented.  Learning Barriers/Preferences:  Learning Barriers/Preferences - 09/07/23 1101       Learning Barriers/Preferences   Learning Barriers Sight;Hearing    Learning Preferences Group Instruction;Individual Instruction;Written Material;Skilled Demonstration          Education Topics: Know Your Numbers Group instruction that is supported by a PowerPoint presentation. Instructor discusses importance of knowing and understanding resting, exercise, and post-exercise oxygen   saturation, heart rate, and blood pressure. Oxygen  saturation, heart rate, blood pressure, rating of perceived exertion, and dyspnea are reviewed along with a normal range for these values.    Exercise for the Pulmonary Patient Group instruction that is supported by a PowerPoint presentation. Instructor discusses benefits of exercise, core components of exercise, frequency, duration, and intensity of an exercise routine, importance of utilizing pulse oximetry during exercise, safety while exercising, and options of places to exercise outside of rehab.    MET Level  Group instruction provided by PowerPoint, verbal discussion, and written material to support subject matter. Instructor reviews what METs are and how to increase METs.    Pulmonary Medications Verbally interactive group education provided by instructor with focus on inhaled medications and proper administration.   Anatomy and Physiology of the Respiratory System Group  instruction provided by PowerPoint, verbal discussion, and written material to support subject matter. Instructor reviews respiratory cycle and anatomical components of the respiratory system and their functions. Instructor also reviews differences in obstructive and restrictive respiratory diseases with examples of each.    Oxygen  Safety Group instruction provided by PowerPoint, verbal discussion, and written material to support subject matter. There is an overview of "What is Oxygen " and "Why do we need it".  Instructor also reviews how to create a safe environment for oxygen  use, the importance of using oxygen  as prescribed, and the risks of noncompliance. There is a brief discussion on traveling with oxygen  and resources the patient may utilize.   Oxygen  Use Group instruction provided by PowerPoint, verbal discussion, and written material to discuss how supplemental oxygen  is prescribed and different types of oxygen  supply systems. Resources for more information are provided.    Breathing Techniques Group instruction that is supported by demonstration and informational handouts. Instructor discusses the benefits of pursed lip and diaphragmatic breathing and detailed demonstration on how to perform both.     Risk Factor Reduction Group instruction that is supported by a PowerPoint presentation. Instructor discusses the definition of a risk factor, different risk factors for pulmonary disease, and how the heart and lungs work together.   Pulmonary Diseases Group instruction provided by PowerPoint, verbal discussion, and written material to support subject matter. Instructor gives an overview of the different type of pulmonary diseases. There is also a discussion on risk factors and symptoms as well as ways to manage the diseases.   Stress and Energy Conservation Group instruction provided by PowerPoint, verbal discussion, and written material to support subject matter. Instructor gives an  overview of stress and the impact it can have on the body. Instructor also reviews ways to reduce stress. There is also a discussion on energy conservation and ways to conserve energy throughout the day.   Warning Signs and Symptoms Group instruction provided by PowerPoint, verbal discussion, and written material to support subject matter. Instructor reviews warning signs and symptoms of stroke, heart attack, cold and flu. Instructor also reviews ways to prevent the spread of infection.   Other Education Group or individual verbal, written, or video instructions that support the educational goals of the pulmonary rehab program.    Knowledge Questionnaire Score:  Knowledge Questionnaire Score - 09/07/23 1136       Knowledge Questionnaire Score   Pre Score 11/18          Core Components/Risk Factors/Patient Goals at Admission:  Personal Goals and Risk Factors at Admission - 09/07/23 1102       Core Components/Risk Factors/Patient Goals on Admission  Weight Management Weight Loss;Yes    Intervention Weight Management: Develop a combined nutrition and exercise program designed to reach desired caloric intake, while maintaining appropriate intake of nutrient and fiber, sodium and fats, and appropriate energy expenditure required for the weight goal.;Weight Management: Provide education and appropriate resources to help participant work on and attain dietary goals.;Weight Management/Obesity: Establish reasonable short term and long term weight goals.;Obesity: Provide education and appropriate resources to help participant work on and attain dietary goals.    Admit Weight 205 lb 7.5 oz (93.2 kg)    Expected Outcomes Short Term: Continue to assess and modify interventions until short term weight is achieved;Long Term: Adherence to nutrition and physical activity/exercise program aimed toward attainment of established weight goal;Weight Loss: Understanding of general recommendations for a  balanced deficit meal plan, which promotes 1-2 lb weight loss per week and includes a negative energy balance of [phone] kcal/d;Understanding recommendations for meals to include 15-35% energy as protein, 25-35% energy from fat, 35-60% energy from carbohydrates, less than 200mg  of dietary cholesterol, 20-35 gm of total fiber daily;Understanding of distribution of calorie intake throughout the day with the consumption of 4-5 meals/snacks    Improve shortness of breath with ADL's Yes    Intervention Provide education, individualized exercise plan and daily activity instruction to help decrease symptoms of SOB with activities of daily living.    Expected Outcomes Short Term: Improve cardiorespiratory fitness to achieve a reduction of symptoms when performing ADLs;Long Term: Be able to perform more ADLs without symptoms or delay the onset of symptoms          Core Components/Risk Factors/Patient Goals Review:    Core Components/Risk Factors/Patient Goals at Discharge (Final Review):    ITP Comments:   Comments: Dr. Genetta Kenning is Medical Director for Pulmonary Rehab at [facility].

## 2023-09-09 DIAGNOSIS — I2693 Single subsegmental pulmonary embolism without acute cor pulmonale: Secondary | ICD-10-CM | POA: Diagnosis not present

## 2023-09-09 DIAGNOSIS — I10 Essential (primary) hypertension: Secondary | ICD-10-CM | POA: Diagnosis not present

## 2023-09-09 DIAGNOSIS — Z86711 Personal history of pulmonary embolism: Secondary | ICD-10-CM | POA: Diagnosis not present

## 2023-09-09 DIAGNOSIS — A419 Sepsis, unspecified organism: Secondary | ICD-10-CM | POA: Diagnosis not present

## 2023-09-11 ENCOUNTER — Other Ambulatory Visit: Payer: Self-pay | Admitting: Behavioral Health

## 2023-09-11 DIAGNOSIS — F411 Generalized anxiety disorder: Secondary | ICD-10-CM

## 2023-09-13 ENCOUNTER — Encounter (HOSPITAL_COMMUNITY)
Admission: RE | Admit: 2023-09-13 | Discharge: 2023-09-13 | Disposition: A | Discharge status: Home / Self Care | Source: Ambulatory Visit | Attending: Internal Medicine

## 2023-09-13 VITALS — Wt 205.0 lb

## 2023-09-13 DIAGNOSIS — J84112 Idiopathic pulmonary fibrosis: Secondary | ICD-10-CM

## 2023-09-13 NOTE — Progress Notes (Signed)
 Daily Session Note  Patient Details  Name: Robert Lang MRN: [id number] Date of Birth: 16-Sep-1950 Referring Provider:   Conrad Ports Pulmonary Rehab Walk Test from 09/07/2023 in [facility] for [facility], Vascular, & Lung Health  Referring Provider Ramaswamy    Encounter Date: 09/13/2023  Check In:  Session Check In - 09/13/23 1033       Check-In   Supervising physician immediately available to respond to emergencies [facility] MD immediately available    Physician(s) Josefa Beauvais, NP    Location [facility]    Staff Present Johnnie Moats, MS, ACSM-CEP, Exercise Physiologist;Tyshawn Ciullo Claudene Candia Levin, RN, BSN;Randi Reeve BS, ACSM-CEP, Exercise Physiologist    Virtual Visit No    Medication changes reported     No    Fall or balance concerns reported    No    Tobacco Cessation No Change    Warm-up and Cool-down Performed as group-led instruction    Resistance Training Performed Yes    VAD Patient? No    PAD/SET Patient? No      Pain Assessment   Currently in Pain? No/denies    Multiple Pain Sites No          Capillary Blood Glucose: No results found for this or any previous visit (from the past 24 hours).   Exercise Prescription Changes - 09/13/23 1200       Response to Exercise   Blood Pressure (Admit) 112/60    Blood Pressure (Exercise) 112/56    Blood Pressure (Exit) 120/64    Heart Rate (Admit) 87 bpm    Heart Rate (Exercise) 88 bpm    Heart Rate (Exit) 85 bpm    Oxygen  Saturation (Admit) 96 %    Oxygen  Saturation (Exercise) 93 %    Oxygen  Saturation (Exit) 92 %    Rating of Perceived Exertion (Exercise) 13    Perceived Dyspnea (Exercise) 1    Duration Progress to 30 minutes of  aerobic without signs/symptoms of physical distress    Intensity THRR unchanged      Progression   Progression Continue to progress workloads to maintain intensity without signs/symptoms of physical distress.      Resistance Training    Training Prescription Yes    Weight red bands    Reps 10-15    Time 10 Minutes      Oxygen    Oxygen  Continuous    Liters 2      NuStep   Level 1    Minutes 15    METs 1.7      Oxygen    Maintain Oxygen  Saturation 88% or higher          Social History   Tobacco Use  Smoking Status Former   Current packs/day: 0.00   Average packs/day: 1 pack/day for 30.0 years (30.0 ttl pk-yrs)   Types: Cigarettes   Start date: 01/09/1989   Quit date: 01/10/2019   Years since quitting: 4.6  Smokeless Tobacco Former    Goals Met:  Proper associated with RPD/PD & O2 Sat Independence with exercise equipment Exercise tolerated well No report of concerns or symptoms today Strength training completed today  Goals Unmet:  Not Applicable  Comments: Service time is from 1005 to 1148.    Dr. Slater Staff is Medical Director for Pulmonary Rehab at [facility].

## 2023-09-15 ENCOUNTER — Encounter (HOSPITAL_COMMUNITY)
Admission: RE | Admit: 2023-09-15 | Discharge: 2023-09-15 | Disposition: A | Discharge status: Home / Self Care | Source: Ambulatory Visit | Attending: Internal Medicine | Admitting: Internal Medicine

## 2023-09-15 DIAGNOSIS — J84112 Idiopathic pulmonary fibrosis: Secondary | ICD-10-CM | POA: Diagnosis not present

## 2023-09-15 NOTE — Progress Notes (Signed)
 Daily Session Note  Patient Details  Name: Robert Lang MRN: [id number] Date of Birth: 1950/12/30 Referring Provider:   Conrad Ports Pulmonary Rehab Walk Test from 09/07/2023 in [facility] for Heart, Vascular, & Lung Health  Referring Provider Ramaswamy    Encounter Date: 09/15/2023  Check In:  Session Check In - 09/15/23 1037       Check-In   Supervising physician immediately available to respond to emergencies [facility] MD immediately available    Physician(s) Rosabel Mose, NP    [facility]    Staff Present Johnnie Moats, MS, ACSM-CEP, Exercise Physiologist;Abdullahi Vallone Claudene Candia Levin, RN, BSN;Randi Reeve BS, ACSM-CEP, Exercise Physiologist    Virtual Visit No    Medication changes reported     No    Fall or balance concerns reported    No    Tobacco Cessation No Change    Warm-up and Cool-down Performed as group-led instruction    Resistance Training Performed Yes    VAD Patient? No    PAD/SET Patient? No      Pain Assessment   Currently in Pain? No/denies    Multiple Pain Sites No          Capillary Blood Glucose: No results found for this or any previous visit (from the past 24 hours).    Social History   Tobacco Use  Smoking Status Former   Current packs/day: 0.00   Average packs/day: 1 pack/day for 30.0 years (30.0 ttl pk-yrs)   Types: Cigarettes   Start date: 01/09/1989   Quit date: 01/10/2019   Years since quitting: 4.6  Smokeless Tobacco Former    Goals Met:  Proper associated with RPD/PD & O2 Sat Independence with exercise equipment Exercise tolerated well No report of concerns or symptoms today Strength training completed today  Goals Unmet:  Not Applicable  Comments: Service time is from 1013 to 1147.    Dr. Slater Staff is Medical Director for Pulmonary Rehab at [facility].

## 2023-09-20 ENCOUNTER — Encounter (HOSPITAL_COMMUNITY)
Admission: RE | Admit: 2023-09-20 | Discharge: 2023-09-20 | Disposition: A | Discharge status: Home / Self Care | Source: Ambulatory Visit | Attending: Internal Medicine | Admitting: Internal Medicine

## 2023-09-20 DIAGNOSIS — J84112 Idiopathic pulmonary fibrosis: Secondary | ICD-10-CM | POA: Insufficient documentation

## 2023-09-20 NOTE — Progress Notes (Signed)
 Daily Session Note  Patient Details  Name: Robert Lang MRN: [id number] Date of Birth: 1950/04/10 Referring Provider:   Conrad Ports Pulmonary Rehab Walk Test from 09/07/2023 in [facility] for Heart, Vascular, & Lung Health  Referring Provider Ramaswamy    Encounter Date: 09/20/2023  Check In:  Session Check In - 09/20/23 1053       Check-In   Supervising physician immediately available to respond to emergencies [facility] MD immediately available    Physician(s) Jackee Alberts, NP    [facility]    Staff Present Johnnie Moats, MS, ACSM-CEP, Exercise Physiologist;Casey Claudene Candia Levin, RN, BSN;Randi Reeve BS, ACSM-CEP, Exercise Physiologist    Virtual Visit No    Medication changes reported     No    Fall or balance concerns reported    No    Tobacco Cessation No Change    Warm-up and Cool-down Performed as group-led instruction    Resistance Training Performed Yes    VAD Patient? No    PAD/SET Patient? No      Pain Assessment   Currently in Pain? No/denies    Multiple Pain Sites No          Capillary Blood Glucose: No results found for this or any previous visit (from the past 24 hours).    Social History   Tobacco Use  Smoking Status Former   Current packs/day: 0.00   Average packs/day: 1 pack/day for 30.0 years (30.0 ttl pk-yrs)   Types: Cigarettes   Start date: 01/09/1989   Quit date: 01/10/2019   Years since quitting: 4.6  Smokeless Tobacco Former    Goals Met:  Independence with exercise equipment Exercise tolerated well No report of concerns or symptoms today Strength training completed today  Goals Unmet:  Not Applicable  Comments: Service time is from 1115 to 1146    Dr. Slater Staff is Medical Director for Pulmonary Rehab at [facility].

## 2023-09-22 ENCOUNTER — Encounter (HOSPITAL_COMMUNITY)
Admission: RE | Admit: 2023-09-22 | Discharge: 2023-09-22 | Disposition: A | Discharge status: Home / Self Care | Source: Ambulatory Visit | Attending: Internal Medicine | Admitting: Internal Medicine

## 2023-09-22 DIAGNOSIS — J84112 Idiopathic pulmonary fibrosis: Secondary | ICD-10-CM

## 2023-09-22 NOTE — Progress Notes (Signed)
 Daily Session Note  Patient Details  Name: Robert Lang MRN: [id number] Date of Birth: 26-Feb-1951 Referring Provider:   Conrad Ports Pulmonary Rehab Walk Test from 09/07/2023 in [facility] for Heart, Vascular, & Lung Health  Referring Provider Ramaswamy    Encounter Date: 09/22/2023  Check In:  Session Check In - 09/22/23 1033       Check-In   Supervising physician immediately available to respond to emergencies [facility] MD immediately available    Physician(s) Lum Louis, NP    [facility]    Staff Present Johnnie Moats, MS, ACSM-CEP, Exercise Physiologist;Mary Harvy, RN, BSN;Randi Reeve BS, ACSM-CEP, Exercise Physiologist;Other    Virtual Visit No    Medication changes reported     No    Fall or balance concerns reported    No    Tobacco Cessation No Change    Warm-up and Cool-down Performed as group-led instruction    Resistance Training Performed Yes    VAD Patient? No    PAD/SET Patient? No      Pain Assessment   Currently in Pain? No/denies    Multiple Pain Sites No          Capillary Blood Glucose: No results found for this or any previous visit (from the past 24 hours).    Social History   Tobacco Use  Smoking Status Former   Current packs/day: 0.00   Average packs/day: 1 pack/day for 30.0 years (30.0 ttl pk-yrs)   Types: Cigarettes   Start date: 01/09/1989   Quit date: 01/10/2019   Years since quitting: 4.7  Smokeless Tobacco Former    Goals Met:  Proper associated with RPD/PD & O2 Sat Exercise tolerated well No report of concerns or symptoms today Strength training completed today  Goals Unmet:  Not Applicable  Comments: Service time is from 1010 to 1147.    Dr. Slater Staff is Medical Director for Pulmonary Rehab at [facility].

## 2023-09-27 ENCOUNTER — Encounter (HOSPITAL_COMMUNITY)
Admission: RE | Admit: 2023-09-27 | Discharge: 2023-09-27 | Disposition: A | Discharge status: Home / Self Care | Source: Ambulatory Visit | Attending: Internal Medicine | Admitting: Internal Medicine

## 2023-09-27 VITALS — Wt 205.0 lb

## 2023-09-27 DIAGNOSIS — J84112 Idiopathic pulmonary fibrosis: Secondary | ICD-10-CM

## 2023-09-27 NOTE — Progress Notes (Signed)
 Daily Session Note  Patient Details  Name: Robert Lang MRN: [id number] Date of Birth: 06-21-1950 Referring Provider:   Conrad Ports Pulmonary Rehab Walk Test from 09/07/2023 in [facility] for Heart, Vascular, & Lung Health  Referring Provider Ramaswamy    Encounter Date: 09/27/2023  Check In:  Session Check In - 09/27/23 1103       Check-In   Supervising physician immediately available to respond to emergencies [facility] MD immediately available    Physician(s) Damien Braver, NP    Location [facility]    Staff Present Johnnie Moats, MS, ACSM-CEP, Exercise Physiologist;Mary Harvy, RN, Wetzel Sharps, RT    Virtual Visit No    Medication changes reported     No    Fall or balance concerns reported    No    Tobacco Cessation No Change    Warm-up and Cool-down Performed as group-led instruction    Resistance Training Performed Yes    VAD Patient? No    PAD/SET Patient? No      Pain Assessment   Currently in Pain? No/denies    Multiple Pain Sites No          Capillary Blood Glucose: No results found for this or any previous visit (from the past 24 hours).   Exercise Prescription Changes - 09/27/23 1200       Response to Exercise   Blood Pressure (Admit) 104/60    Blood Pressure (Exercise) 130/68    Blood Pressure (Exit) 98/68    Heart Rate (Admit) 94 bpm    Heart Rate (Exercise) 103 bpm    Heart Rate (Exit) 99 bpm    Oxygen  Saturation (Admit) 94 %    Oxygen  Saturation (Exercise) 93 %    Oxygen  Saturation (Exit) 94 %    Rating of Perceived Exertion (Exercise) 14    Perceived Dyspnea (Exercise) 2    Duration Continue with 30 min of aerobic exercise without signs/symptoms of physical distress.    Intensity THRR unchanged      Progression   Progression Continue to progress workloads to maintain intensity without signs/symptoms of physical distress.      Resistance Training   Training Prescription Yes    Weight red bands     Reps 10-15    Time 10 Minutes      Oxygen    Oxygen  Continuous    Liters 2      NuStep   Level 1    SPM 72    Minutes 15    METs 1.9      Track   Laps 7    Minutes 15    METs 2.08      Oxygen    Maintain Oxygen  Saturation 88% or higher          Social History   Tobacco Use  Smoking Status Former   Current packs/day: 0.00   Average packs/day: 1 pack/day for 30.0 years (30.0 ttl pk-yrs)   Types: Cigarettes   Start date: 01/09/1989   Quit date: 01/10/2019   Years since quitting: 4.7  Smokeless Tobacco Former    Goals Met:  Proper associated with RPD/PD & O2 Sat Exercise tolerated well No report of concerns or symptoms today Strength training completed today  Goals Unmet:  Not Applicable  Comments: Service time is from 1013 to 1145.   Dr. Slater Staff is Medical Director for Pulmonary Rehab at [facility].

## 2023-09-28 NOTE — Progress Notes (Signed)
 Pulmonary Individual Treatment Plan  Patient Details  Name: Robert Lang MRN: [id number] Date of Birth: 1950/03/23 Referring Provider:   Conrad Ports Pulmonary Rehab Walk Test from 09/07/2023 in [facility]  Referring Provider Ramaswamy    Initial Encounter Date:  Flowsheet Row Pulmonary Rehab Walk Test from 09/07/2023 in [facility]  Date 09/07/23    Visit Diagnosis: IPF (idiopathic pulmonary fibrosis) (HCC)  Patient's Home Medications on Admission:   Current Outpatient Medications:    acetaminophen  (TYLENOL ) 500 MG tablet, Take 1 tablet (500 mg total) by mouth every 8 (eight) hours as needed for moderate pain., Disp: 100 tablet, Rfl: 0   apixaban  (ELIQUIS ) 5 MG TABS tablet, TAKE 1 TABLET(5 MG) BY MOUTH TWICE DAILY, Disp: 60 tablet, Rfl: 5   atorvastatin  (LIPITOR ) 80 MG tablet, TAKE 1 TABLET(80 MG) BY MOUTH DAILY, Disp: 90 tablet, Rfl: 2   buPROPion  (WELLBUTRIN  XL) 150 MG 24 hr tablet, Take 1 tablet (150 mg total) by mouth daily., Disp: 30 tablet, Rfl: 3   busPIRone  (BUSPAR ) 15 MG tablet, Take 1 tablet (15 mg total) by mouth 3 (three) times daily., Disp: 90 tablet, Rfl: 3   Cholecalciferol (VITAMIN D3) 50 MCG (2000 UT) TABS, Take 4,000 Units by mouth 2 (two) times daily., Disp: , Rfl:    docusate sodium  (COLACE) 100 MG capsule, Take 100 mg by mouth daily., Disp: , Rfl:    famotidine  (PEPCID ) 40 MG tablet, Take 1 tablet (40 mg total) by mouth daily. (Patient not taking: Reported on 09/07/2023), Disp: 90 tablet, Rfl: 3   fexofenadine (ALLEGRA) 180 MG tablet, Take 180 mg by mouth at bedtime. , Disp: , Rfl:    finasteride  (PROSCAR ) 5 MG tablet, Take 1 tablet (5 mg total) by mouth daily., Disp: 90 tablet, Rfl: 1   lamoTRIgine  (LAMICTAL ) 100 MG tablet, Take 1 tablet (100 mg total) by mouth daily., Disp: 90 tablet, Rfl: 1   levalbuterol  (XOPENEX ) 1.25 MG/0.5ML nebulizer  solution, Take 1.25 mg by nebulization every 4 (four) hours as needed for wheezing or shortness of breath., Disp: 1 each, Rfl: 12   LORazepam  (ATIVAN ) 0.5 MG tablet, TAKE 1 TABLET BY MOUTH ONLY FOR SEVERE ANXIETY OR AGITATION, Disp: 30 tablet, Rfl: 3   Melatonin 10 MG TABS, Take 10 mg by mouth at bedtime. (Patient not taking: Reported on 09/07/2023), Disp: , Rfl:    MUCINEX  600 MG 12 hr tablet, TAKE 2 TABLETS BY MOUTH TWICE DAILY, Disp: 120 tablet, Rfl: 0   Multiple Vitamins-Minerals (PRESERVISION AREDS PO), Take 1 capsule by mouth in the morning and at bedtime., Disp: , Rfl:    nitroGLYCERIN  (NITROSTAT ) 0.4 MG SL tablet, Place 1 tablet (0.4 mg total) under the tongue every 5 (five) minutes as needed for chest pain., Disp: 25 tablet, Rfl: 3   ondansetron  (ZOFRAN ) 4 MG tablet, Take 1 tablet (4 mg total) by mouth every 8 (eight) hours as needed for nausea or vomiting., Disp: 20 tablet, Rfl: 0   OXYGEN , Inhale 2 L into the lungs daily., Disp: , Rfl:    pantoprazole  (PROTONIX ) 40 MG tablet, Take 1 tablet (40 mg total) by mouth daily., Disp: 30 tablet, Rfl: 6   Pirfenidone  (ESBRIET ) 801 MG TABS, Take 1 tablet (801 mg total) by mouth 3 (three) times daily., Disp: 270 tablet, Rfl: 1   polyethylene glycol powder (GLYCOLAX /MIRALAX ) 17 GM/SCOOP powder, Take 17 g by mouth 2 (two) times daily.,  Disp: , Rfl:    pregabalin  (LYRICA ) 150 MG capsule, TAKE 1 CAPSULE(150 MG) BY MOUTH TWICE DAILY, Disp: 60 capsule, Rfl: 5   ranolazine  (RANEXA ) 1000 MG SR tablet, Take 1 tablet (1,000 mg total) by mouth 2 (two) times daily., Disp: 180 tablet, Rfl: 2   sertraline  (ZOLOFT ) 100 MG tablet, TAKE 2 TABLETS(200 MG) BY MOUTH AT BEDTIME, Disp: 180 tablet, Rfl: 0   traMADol  (ULTRAM ) 50 MG tablet, Take 1 tablet (50 mg total) by mouth at bedtime., Disp: 30 tablet, Rfl: 5   traZODone  (DESYREL ) 50 MG tablet, TAKE 1 TABLET(50 MG) BY MOUTH AT BEDTIME, Disp: 90 tablet, Rfl: 0   vitamin B-12 (CYANOCOBALAMIN ) 1000 MCG tablet, Take 1,000 mcg  by mouth daily., Disp: , Rfl:    vitamin E 1000 UNIT capsule, Take 1,000 Units by mouth 2 (two) times daily at 8 am and 10 pm., Disp: , Rfl:    zolpidem  (AMBIEN ) 5 MG tablet, Take 1 tablet (5 mg total) by mouth at bedtime., Disp: 30 tablet, Rfl: 5 No current facility-administered medications for this encounter.  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 M75.00)   Allergic rhinitis due to pollen 09/03/2013   J30.1)  Formatting of this note might be different from the original. J30.1)   Allergy    Anticoagulated 07/01/2016   Anxiety    Arthritis    Atrial fibrillation (HCC)    Atrial flutter (HCC) 06/26/2015   CAD (coronary artery disease)    Chest pain 06/26/2015   COPD (chronic obstructive pulmonary disease) (HCC)    Coronary artery disease 07/06/2018   Cardiac catheterization 2017 showing 90% small diagonal branch disease   DDD (degenerative disc disease), cervical 09/03/2013   M50.90)  Formatting of this note might be different from the original. M50.90)   Depression    Dizziness 10/28/2017   Dyslipidemia, goal LDL below 70 07/06/2018   Dyspnea on exertion 10/20/2018   Dysrhythmia    Essential hypertension 07/06/2018   ETOH abuse 01/11/2019   6 pack of beer per day   Falls 10/28/2017   GERD (gastroesophageal reflux disease)    H/O amiodarone therapy 07/01/2016   Heart palpitations 01/27/2017   History of colon polyps    History of kidney stones    Hyperlipidemia     Hypertension    IPF (idiopathic pulmonary fibrosis) (HCC)    Neck pain 09/03/2013   Neuropathy 07/06/2018   Obstructive sleep apnea 08/05/2015   PLMD (periodic limb movement disorder) 11/04/2015   Polycythemia, 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 STORY: Due to alcohol / tobacco   Pure hypercholesterolemia 09/03/2013   E78.0)  Formatting of this note might be different from the original. 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/06/2018   2017    Tobacco Use: Social History   Tobacco Use  Smoking Status Former   Current packs/day: 0.00   Average packs/day: 1 pack/day for 30.0 years (30.0 ttl pk-yrs)  Types: Cigarettes   Start date: 01/09/1989   Quit date: 01/10/2019   Years since quitting: 4.7  Smokeless Tobacco Former    Labs: Review Flowsheet  More data exists      Latest Ref Rng & Units 12/30/2021 08/28/2022 09/17/2022 03/01/2023 03/22/2023  Labs for ITP Cardiac and Pulmonary Rehab  Cholestrol 100 - 199 mg/dL 855  - - - 841   LDL (calc) 0 - 99 mg/dL - - - - 73   Direct LDL mg/dL 19.9  - - - -  HDL-C >60 mg/dL 74.99  - - - 26   Trlycerides 0 - 149 mg/dL 657.9  - - - 633   Hemoglobin A1c 4.6 - 6.5 % 5.9  - - 5.7  -  Bicarbonate 20.0 - 28.0 mmol/L - 22.7  25.4  - -  TCO2 22 - 32 mmol/L - 24  - - -  Acid-base deficit 0.0 - 2.0 mmol/L - 1.0  - - -  O2 Saturation % - 77  77.8  - -    Capillary Blood Glucose: Lab Results  Component Value Date   GLUCAP 92 08/26/2021     Pulmonary Assessment Scores:  Pulmonary Assessment Scores     Row Name 09/07/23 1135         ADL UCSD   ADL Phase Entry     SOB Score total 82       CAT Score   CAT Score 22       mMRC Score   mMRC Score 4       UCSD: Self-administered rating of dyspnea associated with activities of daily living (ADLs) 6-point scale (0 = not at all to 5 = maximal or  unable to do because of breathlessness)  Scoring Scores range from 0 to 120.  Minimally important difference is 5 units  CAT: CAT can identify the health impairment of COPD patients and is better correlated with disease progression.  CAT has a scoring range of zero to 40. The CAT score is classified into four groups of low (less than 10), medium (10 - 20), high (21-30) and very high (31-40) based on the impact level of disease on health status. A CAT score over 10 suggests significant symptoms.  A worsening CAT score could be explained by an exacerbation, poor medication adherence, poor inhaler technique, or progression of COPD or comorbid conditions.  CAT MCID is 2 points  mMRC: mMRC (Modified Medical Research Council) Dyspnea Scale is used to assess the degree of baseline functional disability in patients of respiratory disease due to dyspnea. No minimal important difference is established. A decrease in score of 1 point or greater is considered a positive change.   Pulmonary Function Assessment:  Pulmonary Function Assessment - 09/07/23 1112       Breath   Bilateral Breath Sounds Basilar;Rales    Shortness of Breath Yes;Limiting activity          Exercise Target Goals: Exercise Program Goal: Individual exercise prescription set using results from initial 6 min walk test and THRR while considering  patient's activity barriers and safety.   Exercise Prescription Goal: Initial exercise prescription builds to 30-45 minutes a day of aerobic activity, 2-3 days per week.  Home exercise guidelines will be given to patient during program as part of exercise prescription that the participant will acknowledge.  Activity Barriers & Risk Stratification:  Activity Barriers & Cardiac Risk Stratification - 09/07/23 1103       Activity Barriers & Cardiac Risk  Stratification   Activity Barriers Deconditioning;Muscular Weakness;Shortness of Breath;Back Problems;Balance Concerns;History of  Falls;Assistive Device    Cardiac Risk Stratification Moderate          6 Minute Walk:  6 Minute Walk     Row Name 09/07/23 1203         6 Minute Walk   Phase Initial     Distance 645 feet     Walk Time 6 minutes     # of Rest Breaks 2  1:41-2:00, 4:11-4:40     MPH 1.22     METS 1.53     RPE 13     Perceived Dyspnea  1     VO2 Peak 5.35     Symptoms No     Resting HR 89 bpm     Resting BP 98/60     Resting Oxygen  Saturation  96 %     Exercise Oxygen  Saturation  during 6 min walk 93 %     Max Ex. HR 101 bpm     Max Ex. BP 122/62     2 Minute Post BP 118/64       Interval HR   1 Minute HR 101     2 Minute HR 86     3 Minute HR 88     4 Minute HR 94     5 Minute HR 91     6 Minute HR 94     2 Minute Post HR 79     Interval Heart Rate? Yes       Interval Oxygen    Interval Oxygen ? Yes     Baseline Oxygen  Saturation % 96 %     1 Minute Oxygen  Saturation % 93 %     1 Minute Liters of Oxygen  2 L     2 Minute Oxygen  Saturation % 95 %     2 Minute Liters of Oxygen  2 L     3 Minute Oxygen  Saturation % 94 %     3 Minute Liters of Oxygen  2 L     4 Minute Oxygen  Saturation % 94 %     4 Minute Liters of Oxygen  2 L     5 Minute Oxygen  Saturation % 94 %     5 Minute Liters of Oxygen  2 L     6 Minute Oxygen  Saturation % 94 %     6 Minute Liters of Oxygen  2 L     2 Minute Post Oxygen  Saturation % 96 %     2 Minute Post Liters of Oxygen  2 L        Oxygen  Initial Assessment:  Oxygen  Initial Assessment - 09/07/23 1111       Home Oxygen    Home Oxygen  Device Portable Concentrator;Home Concentrator    Sleep Oxygen  Prescription CPAP    Liters per minute 2.5    Home Exercise Oxygen  Prescription Continuous    Liters per minute 2.5    Home Resting Oxygen  Prescription Continuous    Liters per minute 2.5    Compliance with Home Oxygen  Use Yes      Initial 6 min Walk   Oxygen  Used Continuous    Liters per minute 2      Program Oxygen  Prescription   Program Oxygen   Prescription Continuous    Liters per minute 2      Intervention   Short Term Goals To learn and exhibit compliance with exercise, home and travel O2 prescription;To learn and understand importance of monitoring  SPO2 with pulse oximeter and demonstrate accurate use of the pulse oximeter.;To learn and understand importance of maintaining oxygen  saturations>88%;To learn and demonstrate proper pursed lip breathing techniques or other breathing techniques. ;To learn and demonstrate proper use of respiratory medications    Long  Term Goals Exhibits compliance with exercise, home  and travel O2 prescription;Maintenance of O2 saturations>88%;Compliance with respiratory medication;Verbalizes importance of monitoring SPO2 with pulse oximeter and return demonstration;Exhibits proper breathing techniques, such as pursed lip breathing or other method taught during program session;Demonstrates proper use of MDI's          Oxygen  Re-Evaluation:  Oxygen  Re-Evaluation     Row Name 09/22/23 0747             Program Oxygen  Prescription   Program Oxygen  Prescription Continuous       Liters per minute 2         Home Oxygen    Home Oxygen  Device Portable Concentrator;Home Concentrator       Sleep Oxygen  Prescription CPAP       Liters per minute 2.5       Home Exercise Oxygen  Prescription Continuous       Liters per minute 2.5       Home Resting Oxygen  Prescription Continuous       Liters per minute 2.5       Compliance with Home Oxygen  Use Yes         Goals/Expected Outcomes   Short Term Goals To learn and exhibit compliance with exercise, home and travel O2 prescription;To learn and understand importance of monitoring SPO2 with pulse oximeter and demonstrate accurate use of the pulse oximeter.;To learn and understand importance of maintaining oxygen  saturations>88%;To learn and demonstrate proper pursed lip breathing techniques or other breathing techniques. ;To learn and demonstrate proper use of  respiratory medications       Long  Term Goals Exhibits compliance with exercise, home  and travel O2 prescription;Maintenance of O2 saturations>88%;Compliance with respiratory medication;Verbalizes importance of monitoring SPO2 with pulse oximeter and return demonstration;Exhibits proper breathing techniques, such as pursed lip breathing or other method taught during program session;Demonstrates proper use of MDI's       Goals/Expected Outcomes Compliance and understanding of oxygen  saturation monitoring and breathing techniques to decrease shortness of breath.          Oxygen  Discharge (Final Oxygen  Re-Evaluation):  Oxygen  Re-Evaluation - 09/22/23 0747       Program Oxygen  Prescription   Program Oxygen  Prescription Continuous    Liters per minute 2      Home Oxygen    Home Oxygen  Device Portable Concentrator;Home Concentrator    Sleep Oxygen  Prescription CPAP    Liters per minute 2.5    Home Exercise Oxygen  Prescription Continuous    Liters per minute 2.5    Home Resting Oxygen  Prescription Continuous    Liters per minute 2.5    Compliance with Home Oxygen  Use Yes      Goals/Expected Outcomes   Short Term Goals To learn and exhibit compliance with exercise, home and travel O2 prescription;To learn and understand importance of monitoring SPO2 with pulse oximeter and demonstrate accurate use of the pulse oximeter.;To learn and understand importance of maintaining oxygen  saturations>88%;To learn and demonstrate proper pursed lip breathing techniques or other breathing techniques. ;To learn and demonstrate proper use of respiratory medications    Long  Term Goals Exhibits compliance with exercise, home  and travel O2 prescription;Maintenance of O2 saturations>88%;Compliance with respiratory medication;Verbalizes importance of monitoring SPO2 with  pulse oximeter and return demonstration;Exhibits proper breathing techniques, such as pursed lip breathing or other method taught during program  session;Demonstrates proper use of MDI's    Goals/Expected Outcomes Compliance and understanding of oxygen  saturation monitoring and breathing techniques to decrease shortness of breath.          Initial Exercise Prescription:  Initial Exercise Prescription - 09/07/23 1200       Date of Initial Exercise RX and Referring Provider   Date 09/07/23    Referring Provider Ramaswamy    Expected Discharge Date 12/01/23      Oxygen    Oxygen  Continuous    Liters 2    Maintain Oxygen  Saturation 88% or higher      NuStep   Level 1    SPM 50    Minutes 30    METs 1.5      Prescription Details   Frequency (times per week) 2    Duration Progress to 30 minutes of continuous aerobic without signs/symptoms of physical distress      Intensity   THRR 40-80% of Max Heartrate 59-118    Ratings of Perceived Exertion 11-13    Perceived Dyspnea 0-4      Progression   Progression Continue to progress workloads to maintain intensity without signs/symptoms of physical distress.      Resistance Training   Training Prescription Yes    Weight red bands    Reps 10-15          Perform Capillary Blood Glucose checks as needed.  Exercise Prescription Changes:   Exercise Prescription Changes     Row Name 09/13/23 1200 09/27/23 1200           Response to Exercise   Blood Pressure (Admit) 112/60 104/60      Blood Pressure (Exercise) 112/56 130/68      Blood Pressure (Exit) 120/64 98/68      Heart Rate (Admit) 87 bpm 94 bpm      Heart Rate (Exercise) 88 bpm 103 bpm      Heart Rate (Exit) 85 bpm 99 bpm      Oxygen  Saturation (Admit) 96 % 94 %      Oxygen  Saturation (Exercise) 93 % 93 %      Oxygen  Saturation (Exit) 92 % 94 %      Rating of Perceived Exertion (Exercise) 13 14      Perceived Dyspnea (Exercise) 1 2      Duration Progress to 30 minutes of  aerobic without signs/symptoms of physical distress Continue with 30 min of aerobic exercise without signs/symptoms of physical  distress.      Intensity THRR unchanged THRR unchanged        Progression   Progression Continue to progress workloads to maintain intensity without signs/symptoms of physical distress. Continue to progress workloads to maintain intensity without signs/symptoms of physical distress.        Resistance Training   Training Prescription Yes Yes      Weight red bands red bands      Reps 10-15 10-15      Time 10 Minutes 10 Minutes        Oxygen    Oxygen  Continuous Continuous      Liters 2 2        NuStep   Level 1 1      SPM -- 72      Minutes 15 15      METs 1.7 1.9        Track  Laps -- 7      Minutes -- 15      METs -- 2.08        Oxygen    Maintain Oxygen  Saturation 88% or higher 88% or higher         Exercise Comments:   Exercise Comments     Row Name 09/13/23 [phone]           Exercise Comments Patient completed first day of exercise. He exercised for 30 min on the Nustep at level 1. He averaged 1.7 METs, showing deconditioning. Discussed METs with some understanding.          Exercise Goals and Review:   Exercise Goals     Row Name 09/07/23 1106             Exercise Goals   Increase Physical Activity Yes       Intervention Provide advice, education, support and counseling about physical activity/exercise needs.;Develop an individualized exercise prescription for aerobic and resistive training based on initial evaluation findings, risk stratification, comorbidities and participant's personal goals.       Expected Outcomes Short Term: Attend rehab on a regular basis to increase amount of physical activity.;Long Term: Add in home exercise to make exercise part of routine and to increase amount of physical activity.;Long Term: Exercising regularly at least 3-5 days a week.       Increase Strength and Stamina Yes       Intervention Provide advice, education, support and counseling about physical activity/exercise needs.;Develop an individualized exercise  prescription for aerobic and resistive training based on initial evaluation findings, risk stratification, comorbidities and participant's personal goals.       Expected Outcomes Short Term: Increase workloads from initial exercise prescription for resistance, speed, and METs.;Short Term: Perform resistance training exercises routinely during rehab and add in resistance training at home;Long Term: Improve cardiorespiratory fitness, muscular endurance and strength as measured by increased METs and functional capacity ( )       Able to understand and use rate of perceived exertion (RPE) scale Yes       Intervention Provide education and explanation on how to use RPE scale       Expected Outcomes Short Term: Able to use RPE daily in rehab to express subjective intensity level;Long Term:  Able to use RPE to guide intensity level when exercising independently       Able to understand and use Dyspnea scale Yes       Intervention Provide education and explanation on how to use Dyspnea scale       Expected Outcomes Short Term: Able to use Dyspnea scale daily in rehab to express subjective sense of shortness of breath during exertion;Long Term: Able to use Dyspnea scale to guide intensity level when exercising independently       Knowledge and understanding of Target Heart Rate Range (THRR) Yes       Intervention Provide education and explanation of THRR including how the numbers were predicted and where they are located for reference       Expected Outcomes Short Term: Able to state/look up THRR;Long Term: Able to use THRR to govern intensity when exercising independently;Short Term: Able to use daily as guideline for intensity in rehab       Understanding of Exercise Prescription Yes       Intervention Provide education, explanation, and written materials on patient's individual exercise prescription       Expected Outcomes Short Term: Able to explain program exercise  prescription;Long Term: Able to explain  home exercise prescription to exercise independently          Exercise Goals Re-Evaluation :  Exercise Goals Re-Evaluation     Row Name 09/22/23 0743             Exercise Goal Re-Evaluation   Exercise Goals Review Increase Physical Activity;Able to understand and use Dyspnea scale;Understanding of Exercise Prescription;Increase Strength and Stamina;Knowledge and understanding of Target Heart Rate Range (THRR);Able to understand and use rate of perceived exertion (RPE) scale       Comments Vince has completed 3 exercise sessions. He exercises for 30 min on the Nustep. Ansen averages 2 METs at level 1 on the Nustep. He performs the warmup and cooldown standing with rest breaks as needed. It is too soon to notate any discernable progressions. Will continue to monitor and progress as able.       Expected Outcomes Through exercise at rehab and home, the patient will decrease shortness of breath with daily activities and feel confident in carrying out an exercise regimen at home.          Discharge Exercise Prescription (Final Exercise Prescription Changes):  Exercise Prescription Changes - 09/27/23 1200       Response to Exercise   Blood Pressure (Admit) 104/60    Blood Pressure (Exercise) 130/68    Blood Pressure (Exit) 98/68    Heart Rate (Admit) 94 bpm    Heart Rate (Exercise) 103 bpm    Heart Rate (Exit) 99 bpm    Oxygen  Saturation (Admit) 94 %    Oxygen  Saturation (Exercise) 93 %    Oxygen  Saturation (Exit) 94 %    Rating of Perceived Exertion (Exercise) 14    Perceived Dyspnea (Exercise) 2    Duration Continue with 30 min of aerobic exercise without signs/symptoms of physical distress.    Intensity THRR unchanged      Progression   Progression Continue to progress workloads to maintain intensity without signs/symptoms of physical distress.      Resistance Training   Training Prescription Yes    Weight red bands    Reps 10-15    Time 10 Minutes      Oxygen     Oxygen  Continuous    Liters 2      NuStep   Level 1    SPM 72    Minutes 15    METs 1.9      Track   Laps 7    Minutes 15    METs 2.08      Oxygen    Maintain Oxygen  Saturation 88% or higher          Nutrition:  Target Goals: Understanding of nutrition guidelines, daily intake of sodium 1500mg , cholesterol 200mg , calories 30% from fat and 7% or less from saturated fats, daily to have 5 or more servings of fruits and vegetables.  Biometrics:    Nutrition Therapy Plan and Nutrition Goals:  Nutrition Therapy & Goals - 09/22/23 1042       Nutrition Therapy   Diet General Healthy Diet    Drug/Food Interactions Statins/Certain Fruits      Personal Nutrition Goals   Nutrition Goal Patient to improve diet quality by using the plate method as a guide for meal planning to include lean protein/plant protein, fruits, vegetables, whole grains, nonfat dairy as part of a well-balanced diet.    Personal Goal #2 Patient to identify strategies for weight loss of 0.5-2.0# per week.    Comments  Patient has medical history of coronary artery disease s/p PTCA and stenting of LAD and RCA, atrial flutter status post ablation done in 2017, essential hypertension, dyslipidemia, COPD, IPF. He continues esbriet  for treatment of IPF; he did have initial unintentional weight loss when started on medication in 2022 but weight has been stable >6 months. LDL is at goal; triglycerides are elevated. He does report some motivation to lose weight to 185#; will continue to discuss strategies for weight loss including the plate method, calorie density, etc. Patient will benefit from participation in pulmonary rehab for nutrition education, exercise, and lifestyle modification.      Intervention Plan   Intervention Prescribe, educate and counsel regarding individualized specific dietary modifications aiming towards targeted core components such as weight, hypertension, lipid management, diabetes, heart failure  and other comorbidities.;Nutrition handout(s) given to patient.    Expected Outcomes Short Term Goal: Understand basic principles of dietary content, such as calories, fat, sodium, cholesterol and nutrients.;Long Term Goal: Adherence to prescribed nutrition plan.          Nutrition Assessments:  Nutrition Assessments - 09/20/23 1123       Rate Your Plate Scores   Pre Score 34         MEDIFICTS Score Key: >=70 Need to make dietary changes  40-70 Heart Healthy Diet <= 40 Therapeutic Level Cholesterol Diet  Flowsheet Row PULMONARY REHAB OTHER RESPIRATORY from 09/20/2023 in [facility]  Picture Your Plate Total Score on Admission 34   Picture Your Plate Scores: <59 Unhealthy dietary pattern with much room for improvement. 41-50 Dietary pattern unlikely to meet recommendations for good health and room for improvement. 51-60 More healthful dietary pattern, with some room for improvement.  >60 Healthy dietary pattern, although there may be some specific behaviors that could be improved.    Nutrition Goals Re-Evaluation:  Nutrition Goals Re-Evaluation     Row Name 09/22/23 1042             Goals   Current Weight 205 lb 7.5 oz (93.2 kg)  weight at orientation.       Comment triglyceride 366, LDL 73, HDL 26; A1c 5.7       Expected Outcome Patient has medical history of coronary artery disease s/p PTCA and stenting of LAD and RCA, atrial flutter status post ablation done in 2017, essential hypertension, dyslipidemia, COPD, IPF. He continues esbriet  for treatment of IPF; he did have initial unintentional weight loss when started on medication in 2022 but weight has been stable >6 months. LDL is at goal; triglycerides are elevated. He does report some motivation to lose weight to 185#; will continue to discuss strategies for weight loss including the plate method, calorie density, etc. Patient will benefit from participation in  pulmonary rehab for nutrition education, exercise, and lifestyle modification.          Nutrition Goals Discharge (Final Nutrition Goals Re-Evaluation):  Nutrition Goals Re-Evaluation - 09/22/23 1042       Goals   Current Weight 205 lb 7.5 oz (93.2 kg)   weight at orientation.   Comment triglyceride 366, LDL 73, HDL 26; A1c 5.7    Expected Outcome Patient has medical history of coronary artery disease s/p PTCA and stenting of LAD and RCA, atrial flutter status post ablation done in 2017, essential hypertension, dyslipidemia, COPD, IPF. He continues esbriet  for treatment of IPF; he did have initial unintentional weight loss when started on medication in 2022  but weight has been stable >6 months. LDL is at goal; triglycerides are elevated. He does report some motivation to lose weight to 185#; will continue to discuss strategies for weight loss including the plate method, calorie density, etc. Patient will benefit from participation in pulmonary rehab for nutrition education, exercise, and lifestyle modification.          Psychosocial: Target Goals: Acknowledge presence or absence of significant depression and/or stress, maximize coping skills, provide positive support system. Participant is able to verbalize types and ability to use techniques and skills needed for reducing stress and depression.  Initial Review & Psychosocial Screening:  Initial Psych Review & Screening - 09/07/23 1059       Initial Review   Current issues with History of Depression;Current Stress Concerns;Current Psychotropic Meds    Source of Stress Concerns Chronic Illness;Unable to participate in former interests or hobbies;Unable to perform yard/household activities    Comments Juancarlos states he worries about his declining health most days. He is unable to manage things around the house and has to turn the chores over to his wife. Also, he is not able to participate in former hobbies like cooking anymore.       Family Dynamics   Good Support System? Yes    Comments He has great support from his wife, children and friends      Barriers   Psychosocial barriers to participate in program The patient should benefit from training in stress management and relaxation.      Screening Interventions   Interventions Encouraged to exercise;To provide support and resources with identified psychosocial needs    Expected Outcomes Short Term goal: Utilizing psychosocial counselor, staff and physician to assist with identification of specific Stressors or current issues interfering with healing process. Setting desired goal for each stressor or current issue identified.;Long Term Goal: Stressors or current issues are controlled or eliminated.;Short Term goal: Identification and review with participant of any Quality of Life or Depression concerns found by scoring the questionnaire.;Long Term goal: The participant improves quality of Life and PHQ9 Scores as seen by post scores and/or verbalization of changes          Quality of Life Scores:  Scores of 19 and below usually indicate a poorer quality of life in these areas.  A difference of  2-3 points is a clinically meaningful difference.  A difference of 2-3 points in the total score of the Quality of Life Index has been associated with significant improvement in overall quality of life, self-image, physical symptoms, and general health in studies assessing change in quality of life.  PHQ-9: Review Flowsheet  More data exists      09/07/2023 12/30/2022 07/09/2022 12/22/2021 11/25/2020  Depression screen PHQ 2/9  Decreased Interest 0 0 0 0 0  Down, Depressed, Hopeless 1 1 0 0 1  PHQ - 2 Score 1 1 0 0 1  Altered sleeping 0 0 - - -  Tired, decreased energy 0 1 - - -  Change in appetite 0 0 - - -  Feeling bad or failure about yourself  1 0 - - -  Trouble concentrating 1 0 - - -  Moving slowly or fidgety/restless 1 0 - - -  Suicidal thoughts 0 0 - - -  PHQ-9 Score 4  2 - - -  Difficult doing work/chores Somewhat difficult Not difficult at all - - -   Interpretation of Total Score  Total Score Depression Severity:  1-4 = Minimal depression, 5-9 =  Mild depression, 10-14 = Moderate depression, 15-19 = Moderately severe depression, 20-27 = Severe depression   Psychosocial Evaluation and Intervention:  Psychosocial Evaluation - 09/07/23 1142       Psychosocial Evaluation & Interventions   Interventions Stress management education;Relaxation education;Encouraged to exercise with the program and follow exercise prescription    Comments Beniah states he worries about his declining health most days. He is unable to manage things around the house and has to turn the chores over to his wife. Also, he is not able to participate in former hobbies like cooking anymore. He is currently working with a mental health therapist and takes psychotropic meds. Staff will provide education and resources on ways to reduce stress.    Expected Outcomes For pt to participate in PR with less stress    Continue Psychosocial Services  No Follow up required          Psychosocial Re-Evaluation:  Psychosocial Re-Evaluation     Row Name 09/19/23 [phone]             Psychosocial Re-Evaluation   Current issues with History of Depression;Current Psychotropic Meds;Current Stress Concerns       Comments Justan has attended 2 sessions so far. He is enjoying class so far. He denies any new psychosocial barriers or concerns at this time.       Expected Outcomes For Forrest to participate in PR with less stress and any new psychosocial barriers or concerns.       Interventions Encouraged to attend Pulmonary Rehabilitation for the exercise       Continue Psychosocial Services  No Follow up required          Psychosocial Discharge (Final Psychosocial Re-Evaluation):  Psychosocial Re-Evaluation - 09/19/23 0854       Psychosocial Re-Evaluation   Current issues with History of  Depression;Current Psychotropic Meds;Current Stress Concerns    Comments Kortney has attended 2 sessions so far. He is enjoying class so far. He denies any new psychosocial barriers or concerns at this time.    Expected Outcomes For Geary to participate in PR with less stress and any new psychosocial barriers or concerns.    Interventions Encouraged to attend Pulmonary Rehabilitation for the exercise    Continue Psychosocial Services  No Follow up required          Education: Education Goals: Education classes will be provided on a weekly basis, covering required topics. Participant will state understanding/return demonstration of topics presented.  Learning Barriers/Preferences:  Learning Barriers/Preferences - 09/07/23 1101       Learning Barriers/Preferences   Learning Barriers Sight;Hearing    Learning Preferences Group Instruction;Individual Instruction;Written Material;Skilled Demonstration          Education Topics: Know Your Numbers Group instruction that is supported by a PowerPoint presentation. Instructor discusses importance of knowing and understanding resting, exercise, and post-exercise oxygen  saturation, heart rate, and blood pressure. Oxygen  saturation, heart rate, blood pressure, rating of perceived exertion, and dyspnea are reviewed along with a normal range for these values.  Flowsheet Row PULMONARY REHAB OTHER RESPIRATORY from 09/15/2023 in [facility]  Date 09/15/23  Educator EP  Instruction Review Code 1- Verbalizes Understanding    Exercise for the Pulmonary Patient Group instruction that is supported by a PowerPoint presentation. Instructor discusses benefits of exercise, core components of exercise, frequency, duration, and intensity of an exercise routine, importance of utilizing pulse oximetry during exercise, safety while exercising, and options  of places to exercise outside of rehab.    MET  Level  Group instruction provided by PowerPoint, verbal discussion, and written material to support subject matter. Instructor reviews what METs are and how to increase METs.    Pulmonary Medications Verbally interactive group education provided by instructor with focus on inhaled medications and proper administration.   Anatomy and Physiology of the Respiratory System Group instruction provided by PowerPoint, verbal discussion, and written material to support subject matter. Instructor reviews respiratory cycle and anatomical components of the respiratory system and their functions. Instructor also reviews differences in obstructive and restrictive respiratory diseases with examples of each.    Oxygen  Safety Group instruction provided by PowerPoint, verbal discussion, and written material to support subject matter. There is an overview of "What is Oxygen " and "Why do we need it".  Instructor also reviews how to create a safe environment for oxygen  use, the importance of using oxygen  as prescribed, and the risks of noncompliance. There is a brief discussion on traveling with oxygen  and resources the patient may utilize. Flowsheet Row PULMONARY REHAB OTHER RESPIRATORY from 09/22/2023 in [facility], [facility], & Lung Health  Date 09/22/23  Educator RN  Instruction Review Code 1- Verbalizes Understanding    Oxygen  Use Group instruction provided by PowerPoint, verbal discussion, and written material to discuss how supplemental oxygen  is prescribed and different types of oxygen  supply systems. Resources for more information are provided.    Breathing Techniques Group instruction that is supported by demonstration and informational handouts. Instructor discusses the benefits of pursed lip and diaphragmatic breathing and detailed demonstration on how to perform both.     Risk Factor Reduction Group instruction that is supported by a PowerPoint presentation.  Instructor discusses the definition of a risk factor, different risk factors for pulmonary disease, and how the heart and lungs work together.   Pulmonary Diseases Group instruction provided by PowerPoint, verbal discussion, and written material to support subject matter. Instructor gives an overview of the different type of pulmonary diseases. There is also a discussion on risk factors and symptoms as well as ways to manage the diseases.   Stress and Energy Conservation Group instruction provided by PowerPoint, verbal discussion, and written material to support subject matter. Instructor gives an overview of stress and the impact it can have on the body. Instructor also reviews ways to reduce stress. There is also a discussion on energy conservation and ways to conserve energy throughout the day.   Warning Signs and Symptoms Group instruction provided by PowerPoint, verbal discussion, and written material to support subject matter. Instructor reviews warning signs and symptoms of stroke, heart attack, cold and flu. Instructor also reviews ways to prevent the spread of infection.   Other Education Group or individual verbal, written, or video instructions that support the educational goals of the pulmonary rehab program.    Knowledge Questionnaire Score:  Knowledge Questionnaire Score - 09/07/23 1136       Knowledge Questionnaire Score   Pre Score 11/18          Core Components/Risk Factors/Patient Goals at Admission:  Personal Goals and Risk Factors at Admission - 09/07/23 1102       Core Components/Risk Factors/Patient Goals on Admission    Weight Management Weight Loss;Yes    Intervention Weight Management: Develop a combined nutrition and exercise program designed to reach desired caloric intake, while maintaining appropriate intake of nutrient and fiber, sodium and fats, and appropriate energy expenditure required for  the weight goal.;Weight Management: Provide education and  appropriate resources to help participant work on and attain dietary goals.;Weight Management/Obesity: Establish reasonable short term and long term weight goals.;Obesity: Provide education and appropriate resources to help participant work on and attain dietary goals.    Admit Weight 205 lb 7.5 oz (93.2 kg)    Expected Outcomes Short Term: Continue to assess and modify interventions until short term weight is achieved;Long Term: Adherence to nutrition and physical activity/exercise program aimed toward attainment of established weight goal;Weight Loss: Understanding of general recommendations for a balanced deficit meal plan, which promotes 1-2 lb weight loss per week and includes a negative energy balance of [phone] kcal/d;Understanding recommendations for meals to include 15-35% energy as protein, 25-35% energy from fat, 35-60% energy from carbohydrates, less than 200mg  of dietary cholesterol, 20-35 gm of total fiber daily;Understanding of distribution of calorie intake throughout the day with the consumption of 4-5 meals/snacks    Improve shortness of breath with ADL's Yes    Intervention Provide education, individualized exercise plan and daily activity instruction to help decrease symptoms of SOB with activities of daily living.    Expected Outcomes Short Term: Improve cardiorespiratory fitness to achieve a reduction of symptoms when performing ADLs;Long Term: Be able to perform more ADLs without symptoms or delay the onset of symptoms          Core Components/Risk Factors/Patient Goals Review:   Goals and Risk Factor Review     Row Name 09/19/23 0859             Core Components/Risk Factors/Patient Goals Review   Personal Goals Review Weight Management/Obesity;Improve shortness of breath with ADL's;Develop more efficient breathing techniques such as purse lipped breathing and diaphragmatic breathing and practicing self-pacing with activity.       Review Monthly review of patient's Core  Components/Risk Factors/Patient Goals are as follows: Goal progressing for improving shortness of breath. [name] is currently using 2L O2 while exercising to keep sats >88%. He is currently exercising on the Nustep for 30 minutes. Goal progressing for weight loss. Jerran is working with the staff dietitician on ways to meet his weight loss goals. Goal progressing for developing more efficient breathing techniques such as purse lipped breathing and diaphragmatic breathing; and practicing self-pacing with activity. We will continue to monitor Jorah's progress throughout the program.       Expected Outcomes To improve shortness of breath with ADL's, develop more efficient breathing techniques such as purse lipped breathing and diaphragmatic breathing; and practicing self-pacing with activity and lose weight.          Core Components/Risk Factors/Patient Goals at Discharge (Final Review):   Goals and Risk Factor Review - 09/19/23 0859       Core Components/Risk Factors/Patient Goals Review   Personal Goals Review Weight Management/Obesity;Improve shortness of breath with ADL's;Develop more efficient breathing techniques such as purse lipped breathing and diaphragmatic breathing and practicing self-pacing with activity.    Review Monthly review of patient's Core Components/Risk Factors/Patient Goals are as follows: Goal progressing for improving shortness of breath. Marq is currently using 2L O2 while exercising to keep sats >88%. He is currently exercising on the Nustep for 30 minutes. Goal progressing for weight loss. Neymar is working with the staff dietitician on ways to meet his weight loss goals. Goal progressing for developing more efficient breathing techniques such as purse lipped breathing and diaphragmatic breathing; and practicing self-pacing with activity. We will continue to monitor Rory's progress throughout the program.  Expected Outcomes To improve shortness of breath with ADL's,  develop more efficient breathing techniques such as purse lipped breathing and diaphragmatic breathing; and practicing self-pacing with activity and lose weight.          ITP Comments:Pt is making expected progress toward Pulmonary Rehab goals after completing 5 session(s). Recommend continued exercise, life style modification, education, and utilization of breathing techniques to increase stamina and strength, while also decreasing shortness of breath with exertion.  Dr. Slater Staff is Medical Director for Pulmonary Rehab at [facility]

## 2023-09-29 ENCOUNTER — Encounter (HOSPITAL_COMMUNITY)
Admission: RE | Admit: 2023-09-29 | Discharge: 2023-09-29 | Disposition: A | Discharge status: Home / Self Care | Source: Ambulatory Visit | Attending: Internal Medicine | Admitting: Internal Medicine

## 2023-09-29 DIAGNOSIS — J84112 Idiopathic pulmonary fibrosis: Secondary | ICD-10-CM

## 2023-09-29 NOTE — Progress Notes (Signed)
 Daily Session Note  Patient Details  Name: Robert Lang MRN: [id number] Date of Birth: 1950/11/14 Referring Provider:   Conrad Ports Pulmonary Rehab Walk Test from 09/07/2023 in [facility]  Referring Provider Ramaswamy    Encounter Date: 09/29/2023  Check In:  Session Check In - 09/29/23 1111       Check-In   Supervising physician immediately available to respond to emergencies [facility] MD immediately available    Physician(s) Rosabel Mose, NP    Location [facility]    Staff Present Johnnie Moats, MS, ACSM-CEP, Exercise Physiologist;Mary Harvy, RN, Wetzel Sharps, RT    Virtual Visit No    Medication changes reported     No    Fall or balance concerns reported    No    Tobacco Cessation No Change    Warm-up and Cool-down Performed as group-led instruction    Resistance Training Performed Yes    VAD Patient? No    PAD/SET Patient? No      Pain Assessment   Currently in Pain? No/denies    Multiple Pain Sites No          Capillary Blood Glucose: No results found for this or any previous visit (from the past 24 hours).    Social History   Tobacco Use  Smoking Status Former   Current packs/day: 0.00   Average packs/day: 1 pack/day for 30.0 years (30.0 ttl pk-yrs)   Types: Cigarettes   Start date: 01/09/1989   Quit date: 01/10/2019   Years since quitting: 4.7  Smokeless Tobacco Former    Goals Met:  Proper associated with RPD/PD & O2 Sat Exercise tolerated well No report of concerns or symptoms today Strength training completed today  Goals Unmet:  Not Applicable  Comments: Service time is from 1010 to 1139.    Dr. Slater Staff is Medical Director for Pulmonary Rehab at [facility].

## 2023-10-04 ENCOUNTER — Encounter (HOSPITAL_COMMUNITY)
Admission: RE | Admit: 2023-10-04 | Discharge: 2023-10-04 | Disposition: A | Discharge status: Home / Self Care | Source: Ambulatory Visit | Attending: Internal Medicine

## 2023-10-04 DIAGNOSIS — J84112 Idiopathic pulmonary fibrosis: Secondary | ICD-10-CM | POA: Diagnosis not present

## 2023-10-04 NOTE — Progress Notes (Signed)
 Daily Session Note  Patient Details  Name: Robert Lang MRN: [id number] Date of Birth: May 30, 1950 Referring Provider:   Conrad Ports Pulmonary Rehab Walk Test from 09/07/2023 in [facility] for Heart, Vascular, & Lung Health  Referring Provider Ramaswamy    Encounter Date: 10/04/2023  Check In:  Session Check In - 10/04/23 1122       Check-In   Supervising physician immediately available to respond to emergencies [facility] MD immediately available    Physician(s) Rosabel Mose, NP    Location [facility]    Staff Present Johnnie Moats, MS, ACSM-CEP, Exercise Physiologist;Mary Harvy, RN, BSN;Casey Claudene, RT;Randi Reeve BS, ACSM-CEP, Exercise Physiologist;Samantha Belarus, RD, LDN    Virtual Visit No    Medication changes reported     No    Fall or balance concerns reported    No    Tobacco Cessation No Change    Warm-up and Cool-down Performed as group-led instruction    Resistance Training Performed Yes    VAD Patient? No    PAD/SET Patient? No      Pain Assessment   Currently in Pain? No/denies          Capillary Blood Glucose: No results found for this or any previous visit (from the past 24 hours).    Social History   Tobacco Use  Smoking Status Former   Current packs/day: 0.00   Average packs/day: 1 pack/day for 30.0 years (30.0 ttl pk-yrs)   Types: Cigarettes   Start date: 01/09/1989   Quit date: 01/10/2019   Years since quitting: 4.7  Smokeless Tobacco Former    Goals Met:  Proper associated with RPD/PD & O2 Sat Exercise tolerated well No report of concerns or symptoms today Strength training completed today  Goals Unmet:  Not Applicable  Comments: Service time is from 1007 to 1140.    Dr. Slater Staff is Medical Director for Pulmonary Rehab at [facility].

## 2023-10-05 ENCOUNTER — Encounter: Payer: Self-pay | Admitting: Neurology

## 2023-10-05 MED ORDER — TRAMADOL HCL 50 MG PO TABS: 50.0000 mg | ORAL_TABLET | Freq: Every evening | ORAL | 5 refills | Status: DC

## 2023-10-05 NOTE — Telephone Encounter (Signed)
 Requested Prescriptions   Pending Prescriptions Disp Refills   traMADol  (ULTRAM ) 50 MG tablet 30 tablet 5    Sig: Take 1 tablet (50 mg total) by mouth at bedtime.   Last seen 06/22/23 Next appt 01/11/24 Dispenses   Dispensed Days Supply Quantity Provider Pharmacy  TRAMADOL  50MG  TABLETS 09/12/2023 30 30 each Gregg Lek, MD [facility] #...  TRAMADOL  50MG  TABLETS 08/11/2023 30 30 each Gregg Lek, MD [facility] DR[facility]ORE #...  TRAMADOL  50MG  TABLETS 08/04/2023 7 7 each Gregg Lek, MD [facility] #.[id number]

## 2023-10-06 ENCOUNTER — Encounter (HOSPITAL_COMMUNITY)
Admission: RE | Admit: 2023-10-06 | Discharge: 2023-10-06 | Disposition: A | Discharge status: Home / Self Care | Source: Ambulatory Visit | Attending: Internal Medicine | Admitting: Internal Medicine

## 2023-10-06 VITALS — Wt 206.4 lb

## 2023-10-06 DIAGNOSIS — J84112 Idiopathic pulmonary fibrosis: Secondary | ICD-10-CM | POA: Diagnosis not present

## 2023-10-06 NOTE — Progress Notes (Signed)
 Daily Session Note  Patient Details  Name: Robert Lang MRN: [id number] Date of Birth: September 08, 1950 Referring Provider:   Conrad Ports Pul[facility] Rehab Walk Test from 09/07/2023 in [facility]  Referring Provider Ramaswamy    Encounter Date: 10/06/2023  Check In:  Session Check In - 10/06/23 1113       Check-In   Supervising physician immediately available to respond to emergencies [facility] MD immediately available    Physician(s) Lum Louis, NP    Location [facility]    Staff Present Johnnie Moats, MS, ACSM-CEP, Exercise Physiologist;Kirtis Challis Harvy, RN, BSN;Casey Smith, RT;Samantha Belarus, RD, LDN    Virtual Visit No    Medication changes reported     No    Fall or balance concerns reported    No    Tobacco Cessation No Change    Warm-up and Cool-down Performed as group-led instruction    Resistance Training Performed Yes    VAD Patient? No    PAD/SET Patient? No      Pain Assessment   Currently in Pain? No/denies    Multiple Pain Sites No          Capillary Blood Glucose: No results found for this or any previous visit (from the past 24 hours).    Social History   Tobacco Use  Smoking Status Former   Current packs/day: 0.00   Average packs/day: 1 pack/day for 30.0 years (30.0 ttl pk-yrs)   Types: Cigarettes   Start date: 01/09/1989   Quit date: 01/10/2019   Years since quitting: 4.7  Smokeless Tobacco Former    Goals Met:  Exercise tolerated well Queuing for purse lip breathing No report of concerns or symptoms today Strength training completed today  Goals Unmet:  Not Applicable  Comments: Service time is from 1006 to 1140    Dr. Slater Staff is Medical Director for Pulmonary Rehab at [facility].

## 2023-10-07 DIAGNOSIS — A419 Sepsis, unspecified organism: Secondary | ICD-10-CM | POA: Diagnosis not present

## 2023-10-07 DIAGNOSIS — I2693 Single subsegmental pulmonary embolism without acute cor pulmonale: Secondary | ICD-10-CM | POA: Diagnosis not present

## 2023-10-07 DIAGNOSIS — I10 Essential (primary) hypertension: Secondary | ICD-10-CM | POA: Diagnosis not present

## 2023-10-07 DIAGNOSIS — Z86711 Personal history of pulmonary embolism: Secondary | ICD-10-CM | POA: Diagnosis not present

## 2023-10-09 DIAGNOSIS — Z86711 Personal history of pulmonary embolism: Secondary | ICD-10-CM | POA: Diagnosis not present

## 2023-10-09 DIAGNOSIS — I2693 Single subsegmental pulmonary embolism without acute cor pulmonale: Secondary | ICD-10-CM | POA: Diagnosis not present

## 2023-10-09 DIAGNOSIS — I10 Essential (primary) hypertension: Secondary | ICD-10-CM | POA: Diagnosis not present

## 2023-10-09 DIAGNOSIS — A419 Sepsis, unspecified organism: Secondary | ICD-10-CM | POA: Diagnosis not present

## 2023-10-18 ENCOUNTER — Encounter (HOSPITAL_COMMUNITY)
Admission: RE | Admit: 2023-10-18 | Discharge: 2023-10-18 | Disposition: A | Discharge status: Home / Self Care | Source: Ambulatory Visit | Attending: Internal Medicine | Admitting: Internal Medicine

## 2023-10-18 DIAGNOSIS — J84112 Idiopathic pulmonary fibrosis: Secondary | ICD-10-CM

## 2023-10-18 NOTE — Progress Notes (Signed)
 Daily Session Note  Patient Details  Name: Robert Lang MRN: [id number] Date of Birth: Jan 04, 1951 Referring Provider:   Conrad Ports Pulmonary Rehab Walk Test from 09/07/2023 in [facility] for [facility], [facility], & [facility]  Referring Provider Ramaswamy    Encounter Date: 10/18/2023  Check In:  Session Check In - 10/18/23 1148       Check-In   Supervising physician immediately available to respond to emergencies [facility] immediately available    Physician(s) Barnie Press, NP    [facility]    Staff Present Johnnie Moats, MS, ACSM-CEP, Exercise Physiologist;Mary Harvy, RN, BSN;Oluwadamilola Rosamond Claudene Evert Byes, RN, MHA    Virtual Visit No    Medication changes reported     No    Fall or balance concerns reported    No    Tobacco Cessation No Change    Warm-up and Cool-down Performed as group-led instruction    Resistance Training Performed Yes    VAD Patient? No    PAD/SET Patient? No      Pain Assessment   Currently in Pain? No/denies    Multiple Pain Sites No          Capillary Blood Glucose: No results found for this or any previous visit (from the past 24 hours).    Social History   Tobacco Use  Smoking Status Former   Current packs/day: 0.00   Average packs/day: 1 pack/day for 30.0 years (30.0 ttl pk-yrs)   Types: Cigarettes   Start date: 01/09/1989   Quit date: 01/10/2019   Years since quitting: 4.7  Smokeless Tobacco Former    Goals Met:  Proper associated with RPD/PD & O2 Sat Independence with exercise equipment Exercise tolerated well No report of concerns or symptoms today Strength training completed today  Goals Unmet:  Not Applicable  Comments: Service time is from 1010 to 1131.    Dr. Slater Staff is Medical Director for Pulmonary Rehab at [facility].

## 2023-10-20 ENCOUNTER — Encounter (HOSPITAL_COMMUNITY)
Admission: RE | Admit: 2023-10-20 | Discharge: 2023-10-20 | Disposition: A | Discharge status: Home / Self Care | Source: Ambulatory Visit | Attending: Internal Medicine

## 2023-10-20 DIAGNOSIS — J84112 Idiopathic pulmonary fibrosis: Secondary | ICD-10-CM | POA: Diagnosis not present

## 2023-10-20 NOTE — Progress Notes (Signed)
 Daily Session Note  Patient Details  Name: Robert Lang MRN: [id number] Date of Birth: 17-Jan-1951 Referring Provider:   Conrad Ports Pulmonary Rehab Walk Test from 09/07/2023 in [facility], [facility]  Referring Provider Ramaswamy    Encounter Date: 10/20/2023  Check In:  Session Check In - 10/20/23 1046       Check-In   Supervising physician immediately available to respond to emergencies [facility] MD immediately available    Physician(s) Josefa Beauvais, NP    [facility]    Staff Present Johnnie Moats, MS, ACSM-CEP, Exercise Physiologist;Mary Harvy, RN, BSN;Glen Kesinger Claudene, RT;Randi Reeve BS, ACSM-CEP, Exercise Physiologist    Virtual Visit No    Medication changes reported     No    Fall or balance concerns reported    No    Tobacco Cessation No Change    Warm-up and Cool-down Performed as group-led instruction    Resistance Training Performed Yes    VAD Patient? No    PAD/SET Patient? No      Pain Assessment   Currently in Pain? No/denies    Multiple Pain Sites No          Capillary Blood Glucose: No results found for this or any previous visit (from the past 24 hours).    Social History   Tobacco Use  Smoking Status Former   Current packs/day: 0.00   Average packs/day: 1 pack/day for 30.0 years (30.0 ttl pk-yrs)   Types: Cigarettes   Start date: 01/09/1989   Quit date: 01/10/2019   Years since quitting: 4.7  Smokeless Tobacco Former    Goals Met:  Proper associated with RPD/PD & O2 Sat Independence with exercise equipment Exercise tolerated well No report of concerns or symptoms today Strength training completed today  Goals Unmet:  Not Applicable  Comments: Service time is from 1008 to 1138.    Dr. Slater Staff is Medical Director for Pulmonary Rehab at [facility].

## 2023-10-24 ENCOUNTER — Ambulatory Visit: Admitting: Internal Medicine

## 2023-10-24 DIAGNOSIS — Z122 Encounter for screening for malignant neoplasm of respiratory organs: Secondary | ICD-10-CM

## 2023-10-24 DIAGNOSIS — J84112 Idiopathic pulmonary fibrosis: Secondary | ICD-10-CM | POA: Diagnosis not present

## 2023-10-24 DIAGNOSIS — R0902 Hypoxemia: Secondary | ICD-10-CM

## 2023-10-24 DIAGNOSIS — R748 Abnormal levels of other serum enzymes: Secondary | ICD-10-CM

## 2023-10-24 DIAGNOSIS — Z5181 Encounter for therapeutic drug level monitoring: Secondary | ICD-10-CM

## 2023-10-24 DIAGNOSIS — Z86711 Personal history of pulmonary embolism: Secondary | ICD-10-CM

## 2023-10-24 LAB — PULMONARY FUNCTION TEST
DL/VA % pred: 83 %
DL/VA: 3.33 ml/min/mmHg/L
DLCO unc % pred: 65 %
DLCO unc: 17.06 ml/min/mmHg
FEF 25-75 Pre: 1.39 L/s
FEF2575-%Pred-Pre: 57 %
FEV1-%Pred-Pre: 67 %
FEV1-Pre: 2.22 L
FEV1FVC-%Pred-Pre: 99 %
FEV6-%Pred-Pre: 71 %
FEV6-Pre: 3.01 L
FEV6FVC-%Pred-Pre: 104 %
FVC-%Pred-Pre: 67 %
FVC-Pre: 3.05 L
Pre FEV1/FVC ratio: 73 %
Pre FEV6/FVC Ratio: 99 %

## 2023-10-24 NOTE — Patient Instructions (Signed)
Spiro/DLCO performed today. 

## 2023-10-24 NOTE — Progress Notes (Signed)
Spiro/DLCO performed today. 

## 2023-10-25 ENCOUNTER — Telehealth: Payer: Self-pay

## 2023-10-25 ENCOUNTER — Encounter (HOSPITAL_COMMUNITY)
Admission: RE | Admit: 2023-10-25 | Discharge: 2023-10-25 | Disposition: A | Discharge status: Home / Self Care | Source: Ambulatory Visit | Attending: Internal Medicine | Admitting: Internal Medicine

## 2023-10-25 VITALS — Wt 205.2 lb

## 2023-10-25 DIAGNOSIS — J84112 Idiopathic pulmonary fibrosis: Secondary | ICD-10-CM | POA: Insufficient documentation

## 2023-10-25 NOTE — Telephone Encounter (Signed)
 Copied from CRM [id number]. Topic: Appointments - Scheduling Inquiry for Clinic >> Oct 24, 2023  9:30 AM Leila C wrote: Reason for CRM: Patient's spouse Jori [phone] states patient does not a f/u appointment after his PFT 10/24/23 at 2 pm. Jori states no one called to schedule an appointment, because Dr. Reeves schedule was not available. Jori states patient's oxygen  level stay in the 80s and is going to pulmonary rehab Tuesdays and Thursdays. Dr. Geronimo next available is 01/23/24. Jori states patient needs to be seen sooner and no one called back to schedule patient. Per CAL, send a message to clinical. Please advise and call back.  ----------------------------------------------------------------------- From previous Reason for Contact - Scheduling: Patient/patient representative is calling to schedule an appointment. Refer to attachments for appointment information.  Spoke with patient's wife Jori regarding prior message. Patient had a PFT on 10/24/23 and has a Echo on 11/14/23. Patient stated he has been using his Oxygen  at 2l and when he is up moving around his o2 get's below 88%. Patient does have a f/u with Dr.Ramaswamy 12/29/2023 to get the result's of PFT and echo patient's wife wants patient seen sooner. Dr.Ramaswamy is it ok to have patient come in to see a NP or you.  Dr.Ramaswamy can you please advise.

## 2023-10-25 NOTE — Progress Notes (Signed)
 Daily Session Note  Patient Details  Name: Robert Lang MRN: [id number] Date of Birth: 07-27-1950 Referring Provider:   Conrad Ports Pulmonary Rehab Walk Test from 09/07/2023 in [facility] for [facility], Vascular, & Lung [facility]  Referring Provider Ramaswamy    Encounter Date: 10/25/2023  Check In:  Session Check In - 10/25/23 1136       Check-In   Supervising physician immediately available to respond to emergencies [facility] MD immediately available    Physician(s) Josefa Beauvais, NP    [facility]    Staff Present Johnnie Moats, MS, ACSM-CEP, Exercise Physiologist;Mary Harvy, RN, BSN;Jamae Tison Claudene, RT;Randi Reeve BS, ACSM-CEP, Exercise Physiologist    Virtual Visit No    Medication changes reported     No    Fall or balance concerns reported    No    Tobacco Cessation No Change    Warm-up and Cool-down Performed as group-led instruction    Resistance Training Performed Yes    VAD Patient? No    PAD/SET Patient? No      Pain Assessment   Currently in Pain? No/denies    Multiple Pain Sites No          Capillary Blood Glucose: No results found for this or any previous visit (from the past 24 hours).   Exercise Prescription Changes - 10/25/23 1200       Response to Exercise   Blood Pressure (Admit) 90/56    Blood Pressure (Exercise) 120/60    Blood Pressure (Exit) 120/70    Heart Rate (Admit) 85 bpm    Heart Rate (Exercise) 107 bpm    Heart Rate (Exit) 95 bpm    Oxygen  Saturation (Admit) 95 %    Oxygen  Saturation (Exercise) 94 %    Oxygen  Saturation (Exit) 95 %    Rating of Perceived Exertion (Exercise) 13    Perceived Dyspnea (Exercise) 2    Duration Continue with 30 min of aerobic exercise without signs/symptoms of physical distress.    Intensity THRR unchanged      Progression   Progression Continue to progress workloads to maintain intensity without signs/symptoms of physical distress.      Resistance Training    Training Prescription Yes    Weight red bands    Reps 10-15    Time 10 Minutes      Oxygen    Oxygen  Continuous    Liters 2      NuStep   Level 2    Minutes 15    METs 2.4      Track   Laps 10    Minutes 15    METs 2.54      Oxygen    Maintain Oxygen  Saturation 88% or higher          Social History   Tobacco Use  Smoking Status Former   Current packs/day: 0.00   Average packs/day: 1 pack/day for 30.0 years (30.0 ttl pk-yrs)   Types: Cigarettes   Start date: 01/09/1989   Quit date: 01/10/2019   Years since quitting: 4.7  Smokeless Tobacco Former    Goals Met:  Proper associated with RPD/PD & O2 Sat Independence with exercise equipment Exercise tolerated well No report of concerns or symptoms today Strength training completed today  Goals Unmet:  Not Applicable  Comments: Service time is from 1008 to 1140.    Dr. Slater Staff is Medical Director for Pulmonary Rehab at [facility].

## 2023-10-26 ENCOUNTER — Other Ambulatory Visit: Payer: Self-pay | Admitting: Behavioral Health

## 2023-10-26 DIAGNOSIS — F32A Depression, unspecified: Secondary | ICD-10-CM

## 2023-10-26 DIAGNOSIS — F411 Generalized anxiety disorder: Secondary | ICD-10-CM

## 2023-10-26 NOTE — Progress Notes (Signed)
 Pulmonary Individual Treatment Plan  Patient Details  Name: Robert Lang MRN: [id number] Date of Birth: 06/16/1950 Referring Provider:   Conrad Ports Pulmonary Rehab Walk Test from 09/07/2023 in [facility]  Referring Provider Ramaswamy    Initial Encounter Date:  Flowsheet Row Pulmonary Rehab Walk Test from 09/07/2023 in [facility]  Date 09/07/23    Visit Diagnosis: IPF (idiopathic pulmonary fibrosis) (HCC)  Patient's Home Medications on Admission:   Current Outpatient Medications:    acetaminophen  (TYLENOL ) 500 MG tablet, Take 1 tablet (500 mg total) by mouth every 8 (eight) hours as needed for moderate pain., Disp: 100 tablet, Rfl: 0   apixaban  (ELIQUIS ) 5 MG TABS tablet, TAKE 1 TABLET(5 MG) BY MOUTH TWICE DAILY, Disp: 60 tablet, Rfl: 5   atorvastatin  (LIPITOR ) 80 MG tablet, TAKE 1 TABLET(80 MG) BY MOUTH DAILY, Disp: 90 tablet, Rfl: 2   buPROPion  (WELLBUTRIN  XL) 150 MG 24 hr tablet, Take 1 tablet (150 mg total) by mouth daily., Disp: 30 tablet, Rfl: 3   busPIRone  (BUSPAR ) 15 MG tablet, Take 1 tablet (15 mg total) by mouth 3 (three) times daily., Disp: 90 tablet, Rfl: 3   Cholecalciferol (VITAMIN D3) 50 MCG (2000 UT) TABS, Take 4,000 Units by mouth 2 (two) times daily., Disp: , Rfl:    docusate sodium  (COLACE) 100 MG capsule, Take 100 mg by mouth daily., Disp: , Rfl:    famotidine  (PEPCID ) 40 MG tablet, Take 1 tablet (40 mg total) by mouth daily. (Patient not taking: Reported on 09/07/2023), Disp: 90 tablet, Rfl: 3   fexofenadine (ALLEGRA) 180 MG tablet, Take 180 mg by mouth at bedtime. , Disp: , Rfl:    finasteride  (PROSCAR ) 5 MG tablet, Take 1 tablet (5 mg total) by mouth daily., Disp: 90 tablet, Rfl: 1   lamoTRIgine  (LAMICTAL ) 100 MG tablet, Take 1 tablet (100 mg total) by mouth daily., Disp: 90 tablet, Rfl: 1   levalbuterol  (XOPENEX ) 1.25 MG/0.5ML nebulizer  solution, Take 1.25 mg by nebulization every 4 (four) hours as needed for wheezing or shortness of breath., Disp: 1 each, Rfl: 12   LORazepam  (ATIVAN ) 0.5 MG tablet, TAKE 1 TABLET BY MOUTH ONLY FOR SEVERE ANXIETY OR AGITATION, Disp: 30 tablet, Rfl: 3   Melatonin 10 MG TABS, Take 10 mg by mouth at bedtime. (Patient not taking: Reported on 09/07/2023), Disp: , Rfl:    MUCINEX  600 MG 12 hr tablet, TAKE 2 TABLETS BY MOUTH TWICE DAILY, Disp: 120 tablet, Rfl: 0   Multiple Vitamins-Minerals (PRESERVISION AREDS PO), Take 1 capsule by mouth in the morning and at bedtime., Disp: , Rfl:    nitroGLYCERIN  (NITROSTAT ) 0.4 MG SL tablet, Place 1 tablet (0.4 mg total) under the tongue every 5 (five) minutes as needed for chest pain., Disp: 25 tablet, Rfl: 3   ondansetron  (ZOFRAN ) 4 MG tablet, Take 1 tablet (4 mg total) by mouth every 8 (eight) hours as needed for nausea or vomiting., Disp: 20 tablet, Rfl: 0   OXYGEN , Inhale 2 L into the lungs daily., Disp: , Rfl:    pantoprazole  (PROTONIX ) 40 MG tablet, Take 1 tablet (40 mg total) by mouth daily., Disp: 30 tablet, Rfl: 6   Pirfenidone  (ESBRIET ) 801 MG TABS, Take 1 tablet (801 mg total) by mouth 3 (three) times daily., Disp: 270 tablet, Rfl: 1   polyethylene glycol powder (GLYCOLAX /MIRALAX ) 17 GM/SCOOP powder, Take 17 g by mouth 2 (two) times daily.,  Disp: , Rfl:    pregabalin  (LYRICA ) 150 MG capsule, TAKE 1 CAPSULE(150 MG) BY MOUTH TWICE DAILY, Disp: 60 capsule, Rfl: 5   ranolazine  (RANEXA ) 1000 MG SR tablet, Take 1 tablet (1,000 mg total) by mouth 2 (two) times daily., Disp: 180 tablet, Rfl: 2   sertraline  (ZOLOFT ) 100 MG tablet, TAKE 2 TABLETS(200 MG) BY MOUTH AT BEDTIME, Disp: 180 tablet, Rfl: 0   traMADol  (ULTRAM ) 50 MG tablet, Take 1 tablet (50 mg total) by mouth at bedtime., Disp: 30 tablet, Rfl: 5   traZODone  (DESYREL ) 50 MG tablet, TAKE 1 TABLET(50 MG) BY MOUTH AT BEDTIME, Disp: 90 tablet, Rfl: 0   vitamin B-12 (CYANOCOBALAMIN ) 1000 MCG tablet, Take 1,000 mcg  by mouth daily., Disp: , Rfl:    vitamin E 1000 UNIT capsule, Take 1,000 Units by mouth 2 (two) times daily at 8 am and 10 pm., Disp: , Rfl:    zolpidem  (AMBIEN ) 5 MG tablet, Take 1 tablet (5 mg total) by mouth at bedtime., Disp: 30 tablet, Rfl: 5 No current facility-administered medications for this encounter.  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 M75.00)   Allergic rhinitis due to pollen 09/03/2013   J30.1)  Formatting of this note might be different from the original. J30.1)   Allergy    Anticoagulated 07/01/2016   Anxiety    Arthritis    Atrial fibrillation (HCC)    Atrial flutter (HCC) 06/26/2015   CAD (coronary artery disease)    Chest pain 06/26/2015   COPD (chronic obstructive pulmonary disease) (HCC)    Coronary artery disease 07/06/2018   Cardiac catheterization 2017 showing 90% small diagonal branch disease   DDD (degenerative disc disease), cervical 09/03/2013   M50.90)  Formatting of this note might be different from the original. M50.90)   Depression    Dizziness 10/28/2017   Dyslipidemia, goal LDL below 70 07/06/2018   Dyspnea on exertion 10/20/2018   Dysrhythmia    Essential hypertension 07/06/2018   ETOH abuse 01/11/2019   6 pack of beer per day   Falls 10/28/2017   GERD (gastroesophageal reflux disease)    H/O amiodarone therapy 07/01/2016   Heart palpitations 01/27/2017   History of colon polyps    History of kidney stones    Hyperlipidemia     Hypertension    IPF (idiopathic pulmonary fibrosis) (HCC)    Neck pain 09/03/2013   Neuropathy 07/06/2018   Obstructive sleep apnea 08/05/2015   PLMD (periodic limb movement disorder) 11/04/2015   Polycythemia, 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 STORY: Due to alcohol / tobacco   Pure hypercholesterolemia 09/03/2013   E78.0)  Formatting of this note might be different from the original. 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/06/2018   2017    Tobacco Use: Social History   Tobacco Use  Smoking Status Former   Current packs/day: 0.00   Average packs/day: 1 pack/day for 30.0 years (30.0 ttl pk-yrs)  Types: Cigarettes   Start date: 01/09/1989   Quit date: 01/10/2019   Years since quitting: 4.7  Smokeless Tobacco Former    Labs: Review Flowsheet  More data exists      Latest Ref Rng & Units 12/30/2021 08/28/2022 09/17/2022 03/01/2023 03/22/2023  Labs for ITP Cardiac and Pulmonary Rehab  Cholestrol 100 - 199 mg/dL 855  - - - 841   LDL (calc) 0 - 99 mg/dL - - - - 73   Direct LDL mg/dL 19.9  - - - -  HDL-C >60 mg/dL 74.99  - - - 26   Trlycerides 0 - 149 mg/dL 657.9  - - - 633   Hemoglobin A1c 4.6 - 6.5 % 5.9  - - 5.7  -  Bicarbonate 20.0 - 28.0 mmol/L - 22.7  25.4  - -  TCO2 22 - 32 mmol/L - 24  - - -  Acid-base deficit 0.0 - 2.0 mmol/L - 1.0  - - -  O2 Saturation % - 77  77.8  - -    Capillary Blood Glucose: Lab Results  Component Value Date   GLUCAP 92 08/26/2021     Pulmonary Assessment Scores:  Pulmonary Assessment Scores     Row Name 09/07/23 1135         ADL UCSD   ADL Phase Entry     SOB Score total 82       CAT Score   CAT Score 22       mMRC Score   mMRC Score 4       UCSD: Self-administered rating of dyspnea associated with activities of daily living (ADLs) 6-point scale (0 = not at all to 5 = maximal or  unable to do because of breathlessness)  Scoring Scores range from 0 to 120.  Minimally important difference is 5 units  CAT: CAT can identify the health impairment of COPD patients and is better correlated with disease progression.  CAT has a scoring range of zero to 40. The CAT score is classified into four groups of low (less than 10), medium (10 - 20), high (21-30) and very high (31-40) based on the impact level of disease on health status. A CAT score over 10 suggests significant symptoms.  A worsening CAT score could be explained by an exacerbation, poor medication adherence, poor inhaler technique, or progression of COPD or comorbid conditions.  CAT MCID is 2 points  mMRC: mMRC (Modified Medical Research Council) Dyspnea Scale is used to assess the degree of baseline functional disability in patients of respiratory disease due to dyspnea. No minimal important difference is established. A decrease in score of 1 point or greater is considered a positive change.   Pulmonary Function Assessment:  Pulmonary Function Assessment - 09/07/23 1112       Breath   Bilateral Breath Sounds Basilar;Rales    Shortness of Breath Yes;Limiting activity          Exercise Target Goals: Exercise Program Goal: Individual exercise prescription set using results from initial 6 min walk test and THRR while considering  patient's activity barriers and safety.   Exercise Prescription Goal: Initial exercise prescription builds to 30-45 minutes a day of aerobic activity, 2-3 days per week.  Home exercise guidelines will be given to patient during program as part of exercise prescription that the participant will acknowledge.  Activity Barriers & Risk Stratification:  Activity Barriers & Cardiac Risk Stratification - 09/07/23 1103       Activity Barriers & Cardiac Risk  Stratification   Activity Barriers Deconditioning;Muscular Weakness;Shortness of Breath;Back Problems;Balance Concerns;History of  Falls;Assistive Device    Cardiac Risk Stratification Moderate          6 Minute Walk:  6 Minute Walk     Row Name 09/07/23 1203         6 Minute Walk   Phase Initial     Distance 645 feet     Walk Time 6 minutes     # of Rest Breaks 2  1:41-2:00, 4:11-4:40     MPH 1.22     METS 1.53     RPE 13     Perceived Dyspnea  1     VO2 Peak 5.35     Symptoms No     Resting HR 89 bpm     Resting BP 98/60     Resting Oxygen  Saturation  96 %     Exercise Oxygen  Saturation  during 6 min walk 93 %     Max Ex. HR 101 bpm     Max Ex. BP 122/62     2 Minute Post BP 118/64       Interval HR   1 Minute HR 101     2 Minute HR 86     3 Minute HR 88     4 Minute HR 94     5 Minute HR 91     6 Minute HR 94     2 Minute Post HR 79     Interval Heart Rate? Yes       Interval Oxygen    Interval Oxygen ? Yes     Baseline Oxygen  Saturation % 96 %     1 Minute Oxygen  Saturation % 93 %     1 Minute Liters of Oxygen  2 L     2 Minute Oxygen  Saturation % 95 %     2 Minute Liters of Oxygen  2 L     3 Minute Oxygen  Saturation % 94 %     3 Minute Liters of Oxygen  2 L     4 Minute Oxygen  Saturation % 94 %     4 Minute Liters of Oxygen  2 L     5 Minute Oxygen  Saturation % 94 %     5 Minute Liters of Oxygen  2 L     6 Minute Oxygen  Saturation % 94 %     6 Minute Liters of Oxygen  2 L     2 Minute Post Oxygen  Saturation % 96 %     2 Minute Post Liters of Oxygen  2 L        Oxygen  Initial Assessment:  Oxygen  Initial Assessment - 09/07/23 1111       Home Oxygen    Home Oxygen  Device Portable Concentrator;Home Concentrator    Sleep Oxygen  Prescription CPAP    Liters per minute 2.5    Home Exercise Oxygen  Prescription Continuous    Liters per minute 2.5    Home Resting Oxygen  Prescription Continuous    Liters per minute 2.5    Compliance with Home Oxygen  Use Yes      Initial 6 min Walk   Oxygen  Used Continuous    Liters per minute 2      Program Oxygen  Prescription   Program Oxygen   Prescription Continuous    Liters per minute 2      Intervention   Short Term Goals To learn and exhibit compliance with exercise, home and travel O2 prescription;To learn and understand importance of monitoring  SPO2 with pulse oximeter and demonstrate accurate use of the pulse oximeter.;To learn and understand importance of maintaining oxygen  saturations>88%;To learn and demonstrate proper pursed lip breathing techniques or other breathing techniques. ;To learn and demonstrate proper use of respiratory medications    Long  Term Goals Exhibits compliance with exercise, home  and travel O2 prescription;Maintenance of O2 saturations>88%;Compliance with respiratory medication;Verbalizes importance of monitoring SPO2 with pulse oximeter and return demonstration;Exhibits proper breathing techniques, such as pursed lip breathing or other method taught during program session;Demonstrates proper use of MDI's          Oxygen  Re-Evaluation:  Oxygen  Re-Evaluation     Row Name 09/22/23 0747 10/19/23 1106           Program Oxygen  Prescription   Program Oxygen  Prescription Continuous Continuous      Liters per minute 2 2        Home Oxygen    Home Oxygen  Device Portable Concentrator;Home Concentrator Portable Concentrator;Home Concentrator      Sleep Oxygen  Prescription CPAP CPAP      Liters per minute 2.5 2.5      Home Exercise Oxygen  Prescription Continuous Continuous      Liters per minute 2.5 2.5      Home Resting Oxygen  Prescription Continuous Continuous      Liters per minute 2.5 2.5      Compliance with Home Oxygen  Use Yes Yes        Goals/Expected Outcomes   Short Term Goals To learn and exhibit compliance with exercise, home and travel O2 prescription;To learn and understand importance of monitoring SPO2 with pulse oximeter and demonstrate accurate use of the pulse oximeter.;To learn and understand importance of maintaining oxygen  saturations>88%;To learn and demonstrate proper pursed lip  breathing techniques or other breathing techniques. ;To learn and demonstrate proper use of respiratory medications To learn and exhibit compliance with exercise, home and travel O2 prescription;To learn and understand importance of monitoring SPO2 with pulse oximeter and demonstrate accurate use of the pulse oximeter.;To learn and understand importance of maintaining oxygen  saturations>88%;To learn and demonstrate proper pursed lip breathing techniques or other breathing techniques. ;To learn and demonstrate proper use of respiratory medications      Long  Term Goals Exhibits compliance with exercise, home  and travel O2 prescription;Maintenance of O2 saturations>88%;Compliance with respiratory medication;Verbalizes importance of monitoring SPO2 with pulse oximeter and return demonstration;Exhibits proper breathing techniques, such as pursed lip breathing or other method taught during program session;Demonstrates proper use of MDI's Exhibits compliance with exercise, home  and travel O2 prescription;Maintenance of O2 saturations>88%;Compliance with respiratory medication;Verbalizes importance of monitoring SPO2 with pulse oximeter and return demonstration;Exhibits proper breathing techniques, such as pursed lip breathing or other method taught during program session;Demonstrates proper use of MDI's      Goals/Expected Outcomes Compliance and understanding of oxygen  saturation monitoring and breathing techniques to decrease shortness of breath. Compliance and understanding of oxygen  saturation monitoring and breathing techniques to decrease shortness of breath.         Oxygen  Discharge (Final Oxygen  Re-Evaluation):  Oxygen  Re-Evaluation - 10/19/23 1106       Program Oxygen  Prescription   Program Oxygen  Prescription Continuous    Liters per minute 2      Home Oxygen    Home Oxygen  Device Portable Concentrator;Home Concentrator    Sleep Oxygen  Prescription CPAP    Liters per minute 2.5    Home  Exercise Oxygen  Prescription Continuous    Liters per minute 2.5    Home  Resting Oxygen  Prescription Continuous    Liters per minute 2.5    Compliance with Home Oxygen  Use Yes      Goals/Expected Outcomes   Short Term Goals To learn and exhibit compliance with exercise, home and travel O2 prescription;To learn and understand importance of monitoring SPO2 with pulse oximeter and demonstrate accurate use of the pulse oximeter.;To learn and understand importance of maintaining oxygen  saturations>88%;To learn and demonstrate proper pursed lip breathing techniques or other breathing techniques. ;To learn and demonstrate proper use of respiratory medications    Long  Term Goals Exhibits compliance with exercise, home  and travel O2 prescription;Maintenance of O2 saturations>88%;Compliance with respiratory medication;Verbalizes importance of monitoring SPO2 with pulse oximeter and return demonstration;Exhibits proper breathing techniques, such as pursed lip breathing or other method taught during program session;Demonstrates proper use of MDI's    Goals/Expected Outcomes Compliance and understanding of oxygen  saturation monitoring and breathing techniques to decrease shortness of breath.          Initial Exercise Prescription:  Initial Exercise Prescription - 09/07/23 1200       Date of Initial Exercise RX and Referring Provider   Date 09/07/23    Referring Provider Ramaswamy    Expected Discharge Date 12/01/23      Oxygen    Oxygen  Continuous    Liters 2    Maintain Oxygen  Saturation 88% or higher      NuStep   Level 1    SPM 50    Minutes 30    METs 1.5      Prescription Details   Frequency (times per week) 2    Duration Progress to 30 minutes of continuous aerobic without signs/symptoms of physical distress      Intensity   THRR 40-80% of Max Heartrate 59-118    Ratings of Perceived Exertion 11-13    Perceived Dyspnea 0-4      Progression   Progression Continue to progress  workloads to maintain intensity without signs/symptoms of physical distress.      Resistance Training   Training Prescription Yes    Weight red bands    Reps 10-15          Perform Capillary Blood Glucose checks as needed.  Exercise Prescription Changes:   Exercise Prescription Changes     Row Name 09/13/23 1200 09/27/23 1200 10/06/23 0945 10/25/23 1200       Response to Exercise   Blood Pressure (Admit) 112/60 104/60 122/60 90/56    Blood Pressure (Exercise) 112/56 130/68 -- 120/60    Blood Pressure (Exit) 120/64 98/68 112/60 120/70    Heart Rate (Admit) 87 bpm 94 bpm 85 bpm 85 bpm    Heart Rate (Exercise) 88 bpm 103 bpm 113 bpm 107 bpm    Heart Rate (Exit) 85 bpm 99 bpm 95 bpm 95 bpm    Oxygen  Saturation (Admit) 96 % 94 % 96 %  2L 95 %    Oxygen  Saturation (Exercise) 93 % 93 % 93 %  2L 94 %    Oxygen  Saturation (Exit) 92 % 94 % 93 %  2L POC 95 %    Rating of Perceived Exertion (Exercise) 13 14 13 13     Perceived Dyspnea (Exercise) 1 2 2 2     Duration Progress to 30 minutes of  aerobic without signs/symptoms of physical distress Continue with 30 min of aerobic exercise without signs/symptoms of physical distress. Continue with 30 min of aerobic exercise without signs/symptoms of physical distress. Continue with 30 min of  aerobic exercise without signs/symptoms of physical distress.    Intensity THRR unchanged THRR unchanged THRR unchanged THRR unchanged      Progression   Progression Continue to progress workloads to maintain intensity without signs/symptoms of physical distress. Continue to progress workloads to maintain intensity without signs/symptoms of physical distress. Continue to progress workloads to maintain intensity without signs/symptoms of physical distress. Continue to progress workloads to maintain intensity without signs/symptoms of physical distress.      Resistance Training   Training Prescription Yes Yes Yes Yes    Weight red bands red bands red bands red  bands    Reps 10-15 10-15 10-15 10-15    Time 10 Minutes 10 Minutes 10 Minutes 10 Minutes      Oxygen    Oxygen  Continuous Continuous Continuous Continuous    Liters 2 2 2 2       NuStep   Level 1 1 2 2     SPM -- 72 88 --    Minutes 15 15 15 15     METs 1.7 1.9 2.1 2.4      Track   Laps -- 7 9 10     Minutes -- 15 15 15     METs -- 2.08 2.38 2.54      Oxygen    Maintain Oxygen  Saturation 88% or higher 88% or higher 88% or higher 88% or higher       Exercise Comments:   Exercise Comments     Row Name 09/13/23 [phone]           Exercise Comments Patient completed first day of exercise. He exercised for 30 min on the Nustep at level 1. He averaged 1.7 METs, showing deconditioning. Discussed METs with some understanding.          Exercise Goals and Review:   Exercise Goals     Row Name 09/07/23 1106             Exercise Goals   Increase Physical Activity Yes       Intervention Provide advice, education, support and counseling about physical activity/exercise needs.;Develop an individualized exercise prescription for aerobic and resistive training based on initial evaluation findings, risk stratification, comorbidities and participant's personal goals.       Expected Outcomes Short Term: Attend rehab on a regular basis to increase amount of physical activity.;Long Term: Add in home exercise to make exercise part of routine and to increase amount of physical activity.;Long Term: Exercising regularly at least 3-5 days a week.       Increase Strength and Stamina Yes       Intervention Provide advice, education, support and counseling about physical activity/exercise needs.;Develop an individualized exercise prescription for aerobic and resistive training based on initial evaluation findings, risk stratification, comorbidities and participant's personal goals.       Expected Outcomes Short Term: Increase workloads from initial exercise prescription for resistance, speed, and  METs.;Short Term: Perform resistance training exercises routinely during rehab and add in resistance training at home;Long Term: Improve cardiorespiratory fitness, muscular endurance and strength as measured by increased METs and functional capacity ( )       Able to understand and use rate of perceived exertion (RPE) scale Yes       Intervention Provide education and explanation on how to use RPE scale       Expected Outcomes Short Term: Able to use RPE daily in rehab to express subjective intensity level;Long Term:  Able to use RPE to guide intensity level when exercising independently  Able to understand and use Dyspnea scale Yes       Intervention Provide education and explanation on how to use Dyspnea scale       Expected Outcomes Short Term: Able to use Dyspnea scale daily in rehab to express subjective sense of shortness of breath during exertion;Long Term: Able to use Dyspnea scale to guide intensity level when exercising independently       Knowledge and understanding of Target Heart Rate Range (THRR) Yes       Intervention Provide education and explanation of THRR including how the numbers were predicted and where they are located for reference       Expected Outcomes Short Term: Able to state/look up THRR;Long Term: Able to use THRR to govern intensity when exercising independently;Short Term: Able to use daily as guideline for intensity in rehab       Understanding of Exercise Prescription Yes       Intervention Provide education, explanation, and written materials on patient's individual exercise prescription       Expected Outcomes Short Term: Able to explain program exercise prescription;Long Term: Able to explain home exercise prescription to exercise independently          Exercise Goals Re-Evaluation :  Exercise Goals Re-Evaluation     Row Name 09/22/23 0743 10/19/23 1102           Exercise Goal Re-Evaluation   Exercise Goals Review Increase Physical Activity;Able  to understand and use Dyspnea scale;Understanding of Exercise Prescription;Increase Strength and Stamina;Knowledge and understanding of Target Heart Rate Range (THRR);Able to understand and use rate of perceived exertion (RPE) scale Increase Physical Activity;Able to understand and use Dyspnea scale;Understanding of Exercise Prescription;Increase Strength and Stamina;Knowledge and understanding of Target Heart Rate Range (THRR);Able to understand and use rate of perceived exertion (RPE) scale      Comments Brighten has completed 3 exercise sessions. He exercises for 30 min on the Nustep. Daqwan averages 2 METs at level 1 on the Nustep. He performs the warmup and cooldown standing with rest breaks as needed. It is too soon to notate any discernable progressions. Will continue to monitor and progress as able. Jaheem has completed 9 exercise sessions. He exercises for 15 min on the track and Nustep. Ken averages 2.23 METs on the track and 2.6 METs at level 2 on the Nustep. He performs the warmup and cooldown standing with rest breaks as needed. [place] has progressed to walking the track. He tolerates walking well and takes rest breaks as needed. He has also increased his level on the Nustep. [place] seems motivated to exercise. Will continue to monitor and progress as able.      Expected Outcomes Through exercise at rehab and home, the patient will decrease shortness of breath with daily activities and feel confident in carrying out an exercise regimen at home. Through exercise at rehab and home, the patient will decrease shortness of breath with daily activities and feel confident in carrying out an exercise regimen at home.         Discharge Exercise Prescription (Final Exercise Prescription Changes):  Exercise Prescription Changes - 10/25/23 1200       Response to Exercise   Blood Pressure (Admit) 90/56    Blood Pressure (Exercise) 120/60    Blood Pressure (Exit) 120/70    Heart Rate (Admit) 85 bpm    Heart  Rate (Exercise) 107 bpm    Heart Rate (Exit) 95 bpm    Oxygen  Saturation (Admit) 95 %  Oxygen  Saturation (Exercise) 94 %    Oxygen  Saturation (Exit) 95 %    Rating of Perceived Exertion (Exercise) 13    Perceived Dyspnea (Exercise) 2    Duration Continue with 30 min of aerobic exercise without signs/symptoms of physical distress.    Intensity THRR unchanged      Progression   Progression Continue to progress workloads to maintain intensity without signs/symptoms of physical distress.      Resistance Training   Training Prescription Yes    Weight red bands    Reps 10-15    Time 10 Minutes      Oxygen    Oxygen  Continuous    Liters 2      NuStep   Level 2    Minutes 15    METs 2.4      Track   Laps 10    Minutes 15    METs 2.54      Oxygen    Maintain Oxygen  Saturation 88% or higher          Nutrition:  Target Goals: Understanding of nutrition guidelines, daily intake of sodium 1500mg , cholesterol 200mg , calories 30% from fat and 7% or less from saturated fats, daily to have 5 or more servings of fruits and vegetables.  Biometrics:    Nutrition Therapy Plan and Nutrition Goals:  Nutrition Therapy & Goals - 10/20/23 1459       Nutrition Therapy   Diet General Healthy Diet    Drug/Food Interactions Statins/Certain Fruits      Personal Nutrition Goals   Nutrition Goal Patient to improve diet quality by using the plate method as a guide for meal planning to include lean protein/plant protein, fruits, vegetables, whole grains, nonfat dairy as part of a well-balanced diet.   goal in progress.   Personal Goal #2 Patient to identify strategies for weight loss of 0.5-2.0# per week.   goal not met.   Comments Goals in progress. Patient has medical history of coronary artery disease s/p PTCA and stenting of LAD and RCA, atrial flutter status post ablation done in 2017, essential hypertension, dyslipidemia, COPD, IPF. He continues esbriet  for treatment of IPF; he did  have initial unintentional weight loss when started on medication in 2022 but weight has been stable >6 months. LDL is at goal; triglycerides are elevated. He does report some motivation to lose weight to 185#; have discussed strategies for weight loss including the plate method, calorie density, etc. He has maintained his weight since starting with our program. Patient will benefit from participation in pulmonary rehab for nutrition education, exercise, and lifestyle modification.      Intervention Plan   Intervention Prescribe, educate and counsel regarding individualized specific dietary modifications aiming towards targeted core components such as weight, hypertension, lipid management, diabetes, heart failure and other comorbidities.;Nutrition handout(s) given to patient.    Expected Outcomes Short Term Goal: Understand basic principles of dietary content, such as calories, fat, sodium, cholesterol and nutrients.;Long Term Goal: Adherence to prescribed nutrition plan.          Nutrition Assessments:  Nutrition Assessments - 09/20/23 1123       Rate Your Plate Scores   Pre Score 34         MEDIFICTS Score Key: >=70 Need to make dietary changes  40-70 Heart Healthy Diet <= 40 Therapeutic Level Cholesterol Diet  Flowsheet Row PULMONARY REHAB OTHER RESPIRATORY from 09/20/2023 in [facility]  Picture Your Plate Total  Score on Admission 34   Picture Your Plate Scores: <59 Unhealthy dietary pattern with much room for improvement. 41-50 Dietary pattern unlikely to meet recommendations for good health and room for improvement. 51-60 More healthful dietary pattern, with some room for improvement.  >60 Healthy dietary pattern, although there may be some specific behaviors that could be improved.    Nutrition Goals Re-Evaluation:  Nutrition Goals Re-Evaluation     Row Name 09/22/23 1042 10/20/23 1459           Goals    Current Weight 205 lb 7.5 oz (93.2 kg)  weight at orientation. 205 lb 14.6 oz (93.4 kg)      Comment triglyceride 366, LDL 73, HDL 26; A1c 5.7 triglyceride 366, LDL 73, HDL 26; A1c 5.7      Expected Outcome Patient has medical history of coronary artery disease s/p PTCA and stenting of LAD and RCA, atrial flutter status post ablation done in 2017, essential hypertension, dyslipidemia, COPD, IPF. He continues esbriet  for treatment of IPF; he did have initial unintentional weight loss when started on medication in 2022 but weight has been stable >6 months. LDL is at goal; triglycerides are elevated. He does report some motivation to lose weight to 185#; will continue to discuss strategies for weight loss including the plate method, calorie density, etc. Patient will benefit from participation in pulmonary rehab for nutrition education, exercise, and lifestyle modification. Goals in progress. Patient has medical history of coronary artery disease s/p PTCA and stenting of LAD and RCA, atrial flutter status post ablation done in 2017, essential hypertension, dyslipidemia, COPD, IPF. He continues esbriet  for treatment of IPF; he did have initial unintentional weight loss when started on medication in 2022 but weight has been stable >6 months. LDL is at goal; triglycerides are elevated. He does report some motivation to lose weight to 185#; have discussed strategies for weight loss including the plate method, calorie density, etc. He has maintained his weight since starting with our program. Patient will benefit from participation in pulmonary rehab for nutrition education, exercise, and lifestyle modification.         Nutrition Goals Discharge (Final Nutrition Goals Re-Evaluation):  Nutrition Goals Re-Evaluation - 10/20/23 1459       Goals   Current Weight 205 lb 14.6 oz (93.4 kg)    Comment triglyceride 366, LDL 73, HDL 26; A1c 5.7    Expected Outcome Goals in progress. Patient has medical history of  coronary artery disease s/p PTCA and stenting of LAD and RCA, atrial flutter status post ablation done in 2017, essential hypertension, dyslipidemia, COPD, IPF. He continues esbriet  for treatment of IPF; he did have initial unintentional weight loss when started on medication in 2022 but weight has been stable >6 months. LDL is at goal; triglycerides are elevated. He does report some motivation to lose weight to 185#; have discussed strategies for weight loss including the plate method, calorie density, etc. He has maintained his weight since starting with our program. Patient will benefit from participation in pulmonary rehab for nutrition education, exercise, and lifestyle modification.          Psychosocial: Target Goals: Acknowledge presence or absence of significant depression and/or stress, maximize coping skills, provide positive support system. Participant is able to verbalize types and ability to use techniques and skills needed for reducing stress and depression.  Initial Review & Psychosocial Screening:  Initial Psych Review & Screening - 09/07/23 1059       Initial Review  Current issues with History of Depression;Current Stress Concerns;Current Psychotropic Meds    Source of Stress Concerns Chronic Illness;Unable to participate in former interests or hobbies;Unable to perform yard/household activities    Comments Waldemar states he worries about his declining health most days. He is unable to manage things around the house and has to turn the chores over to his wife. Also, he is not able to participate in former hobbies like cooking anymore.      Family Dynamics   Good Support System? Yes    Comments He has great support from his wife, children and friends      Barriers   Psychosocial barriers to participate in program The patient should benefit from training in stress management and relaxation.      Screening Interventions   Interventions Encouraged to exercise;To provide  support and resources with identified psychosocial needs    Expected Outcomes Short Term goal: Utilizing psychosocial counselor, staff and physician to assist with identification of specific Stressors or current issues interfering with healing process. Setting desired goal for each stressor or current issue identified.;Long Term Goal: Stressors or current issues are controlled or eliminated.;Short Term goal: Identification and review with participant of any Quality of Life or Depression concerns found by scoring the questionnaire.;Long Term goal: The participant improves quality of Life and PHQ9 Scores as seen by post scores and/or verbalization of changes          Quality of Life Scores:  Scores of 19 and below usually indicate a poorer quality of life in these areas.  A difference of  2-3 points is a clinically meaningful difference.  A difference of 2-3 points in the total score of the Quality of Life Index has been associated with significant improvement in overall quality of life, self-image, physical symptoms, and general health in studies assessing change in quality of life.  PHQ-9: Review Flowsheet  More data exists      09/07/2023 12/30/2022 07/09/2022 12/22/2021 11/25/2020  Depression screen PHQ 2/9  Decreased Interest 0 0 0 0 0  Down, Depressed, Hopeless 1 1 0 0 1  PHQ - 2 Score 1 1 0 0 1  Altered sleeping 0 0 - - -  Tired, decreased energy 0 1 - - -  Change in appetite 0 0 - - -  Feeling bad or failure about yourself  1 0 - - -  Trouble concentrating 1 0 - - -  Moving slowly or fidgety/restless 1 0 - - -  Suicidal thoughts 0 0 - - -  PHQ-9 Score 4 2 - - -  Difficult doing work/chores Somewhat difficult Not difficult at all - - -   Interpretation of Total Score  Total Score Depression Severity:  1-4 = Minimal depression, 5-9 = Mild depression, 10-14 = Moderate depression, 15-19 = Moderately severe depression, 20-27 = Severe depression   Psychosocial Evaluation and  Intervention:  Psychosocial Evaluation - 09/07/23 1142       Psychosocial Evaluation & Interventions   Interventions Stress management education;Relaxation education;Encouraged to exercise with the program and follow exercise prescription    Comments Harper states he worries about his declining health most days. He is unable to manage things around the house and has to turn the chores over to his wife. Also, he is not able to participate in former hobbies like cooking anymore. He is currently working with a mental health therapist and takes psychotropic meds. Staff will provide education and resources on ways to reduce stress.  Expected Outcomes For pt to participate in PR with less stress    Continue Psychosocial Services  No Follow up required          Psychosocial Re-Evaluation:  Psychosocial Re-Evaluation     Row Name 09/19/23 0854 10/19/23 0941           Psychosocial Re-Evaluation   Current issues with History of Depression;Current Psychotropic Meds;Current Stress Concerns History of Depression;Current Psychotropic Meds;Current Stress Concerns      Comments Jalal has attended 2 sessions so far. He is enjoying class so far. He denies any new psychosocial barriers or concerns at this time. [place] is doing well in class. He has voiced that he feels sad and discouraged most days. He struggles with the fact that he needs to wear his oxygen  while in the shower and states I'm just not going to do it. Staff encourages [place] in class and have suggested that he reach out to his mental health therapist.      Expected Outcomes For Clarke to participate in PR with less stress and any new psychosocial barriers or concerns. For Keenon to participate in PR with less stress and any new psychosocial barriers or concerns.      Interventions Encouraged to attend Pulmonary Rehabilitation for the exercise Encouraged to attend Pulmonary Rehabilitation for the exercise      Continue Psychosocial Services   No Follow up required Follow up required by staff         Psychosocial Discharge (Final Psychosocial Re-Evaluation):  Psychosocial Re-Evaluation - 10/19/23 0941       Psychosocial Re-Evaluation   Current issues with History of Depression;Current Psychotropic Meds;Current Stress Concerns    Comments [place] is doing well in class. He has voiced that he feels sad and discouraged most days. He struggles with the fact that he needs to wear his oxygen  while in the shower and states I'm just not going to do it. Staff encourages [place] in class and have suggested that he reach out to his mental health therapist.    Expected Outcomes For Denzell to participate in PR with less stress and any new psychosocial barriers or concerns.    Interventions Encouraged to attend Pulmonary Rehabilitation for the exercise    Continue Psychosocial Services  Follow up required by staff          Education: Education Goals: Education classes will be provided on a weekly basis, covering required topics. Participant will state understanding/return demonstration of topics presented.  Learning Barriers/Preferences:  Learning Barriers/Preferences - 09/07/23 1101       Learning Barriers/Preferences   Learning Barriers Sight;Hearing    Learning Preferences Group Instruction;Individual Instruction;Written Material;Skilled Demonstration          Education Topics: Know Your Numbers Group instruction that is supported by a PowerPoint presentation. Instructor discusses importance of knowing and understanding resting, exercise, and post-exercise oxygen  saturation, heart rate, and blood pressure. Oxygen  saturation, heart rate, blood pressure, rating of perceived exertion, and dyspnea are reviewed along with a normal range for these values.  Flowsheet Row PULMONARY REHAB OTHER RESPIRATORY from 09/15/2023 in [facility]  Date 09/15/23  Educator EP  Instruction  Review Code 1- Verbalizes Understanding    Exercise for the Pulmonary Patient Group instruction that is supported by a PowerPoint presentation. Instructor discusses benefits of exercise, core components of exercise, frequency, duration, and intensity of an exercise routine, importance of utilizing pulse oximetry during exercise, safety while exercising, and  options of places to exercise outside of rehab.    MET Level  Group instruction provided by PowerPoint, verbal discussion, and written material to support subject matter. Instructor reviews what METs are and how to increase METs.    Pulmonary Medications Verbally interactive group education provided by instructor with focus on inhaled medications and proper administration.   Anatomy and Physiology of the Respiratory System Group instruction provided by PowerPoint, verbal discussion, and written material to support subject matter. Instructor reviews respiratory cycle and anatomical components of the respiratory system and their functions. Instructor also reviews differences in obstructive and restrictive respiratory diseases with examples of each.    Oxygen  Safety Group instruction provided by PowerPoint, verbal discussion, and written material to support subject matter. There is an overview of "What is Oxygen " and "Why do we need it".  Instructor also reviews how to create a safe environment for oxygen  use, the importance of using oxygen  as prescribed, and the risks of noncompliance. There is a brief discussion on traveling with oxygen  and resources the patient may utilize. Flowsheet Row PULMONARY REHAB OTHER RESPIRATORY from 09/22/2023 in [facility]  Date 09/22/23  Educator RN  Instruction Review Code 1- Verbalizes Understanding    Oxygen  Use Group instruction provided by PowerPoint, verbal discussion, and written material to discuss how supplemental oxygen  is prescribed and  different types of oxygen  supply systems. Resources for more information are provided.    Breathing Techniques Group instruction that is supported by demonstration and informational handouts. Instructor discusses the benefits of pursed lip and diaphragmatic breathing and detailed demonstration on how to perform both.  Flowsheet Row PULMONARY REHAB OTHER RESPIRATORY from 10/06/2023 in [facility]  Date 10/06/23  Educator RN  Instruction Review Code 1- Verbalizes Understanding     Risk Factor Reduction Group instruction that is supported by a PowerPoint presentation. Instructor discusses the definition of a risk factor, different risk factors for pulmonary disease, and how the heart and lungs work together.   Pulmonary Diseases Group instruction provided by PowerPoint, verbal discussion, and written material to support subject matter. Instructor gives an overview of the different type of pulmonary diseases. There is also a discussion on risk factors and symptoms as well as ways to manage the diseases.   Stress and Energy Conservation Group instruction provided by PowerPoint, verbal discussion, and written material to support subject matter. Instructor gives an overview of stress and the impact it can have on the body. Instructor also reviews ways to reduce stress. There is also a discussion on energy conservation and ways to conserve energy throughout the day.   Warning Signs and Symptoms Group instruction provided by PowerPoint, verbal discussion, and written material to support subject matter. Instructor reviews warning signs and symptoms of stroke, heart attack, cold and flu. Instructor also reviews ways to prevent the spread of infection. Flowsheet Row PULMONARY REHAB OTHER RESPIRATORY from 10/20/2023 in [facility]  Date 10/20/23  Educator RN  Instruction Review Code 1-  Verbalizes Understanding    Other Education Group or individual verbal, written, or video instructions that support the educational goals of the pulmonary rehab program.    Knowledge Questionnaire Score:  Knowledge Questionnaire Score - 09/07/23 1136       Knowledge Questionnaire Score   Pre Score 11/18          Core Components/Risk Factors/Patient  Goals at Admission:  Personal Goals and Risk Factors at Admission - 09/07/23 1102       Core Components/Risk Factors/Patient Goals on Admission    Weight Management Weight Loss;Yes    Intervention Weight Management: Develop a combined nutrition and exercise program designed to reach desired caloric intake, while maintaining appropriate intake of nutrient and fiber, sodium and fats, and appropriate energy expenditure required for the weight goal.;Weight Management: Provide education and appropriate resources to help participant work on and attain dietary goals.;Weight Management/Obesity: Establish reasonable short term and long term weight goals.;Obesity: Provide education and appropriate resources to help participant work on and attain dietary goals.    Admit Weight 205 lb 7.5 oz (93.2 kg)    Expected Outcomes Short Term: Continue to assess and modify interventions until short term weight is achieved;Long Term: Adherence to nutrition and physical activity/exercise program aimed toward attainment of established weight goal;Weight Loss: Understanding of general recommendations for a balanced deficit meal plan, which promotes 1-2 lb weight loss per week and includes a negative energy balance of [phone] kcal/d;Understanding recommendations for meals to include 15-35% energy as protein, 25-35% energy from fat, 35-60% energy from carbohydrates, less than 200mg  of dietary cholesterol, 20-35 gm of total fiber daily;Understanding of distribution of calorie intake throughout the day with the consumption of 4-5 meals/snacks    Improve shortness of breath  with ADL's Yes    Intervention Provide education, individualized exercise plan and daily activity instruction to help decrease symptoms of SOB with activities of daily living.    Expected Outcomes Short Term: Improve cardiorespiratory fitness to achieve a reduction of symptoms when performing ADLs;Long Term: Be able to perform more ADLs without symptoms or delay the onset of symptoms          Core Components/Risk Factors/Patient Goals Review:   Goals and Risk Factor Review     Row Name 09/19/23 0859 10/19/23 0944           Core Components/Risk Factors/Patient Goals Review   Personal Goals Review Weight Management/Obesity;Improve shortness of breath with ADL's;Develop more efficient breathing techniques such as purse lipped breathing and diaphragmatic breathing and practicing self-pacing with activity. Weight Management/Obesity;Improve shortness of breath with ADL's;Develop more efficient breathing techniques such as purse lipped breathing and diaphragmatic breathing and practicing self-pacing with activity.      Review Monthly review of patient's Core Components/Risk Factors/Patient Goals are as follows: Goal progressing for improving shortness of breath. Arlind is currently using 2L O2 while exercising to keep sats >88%. He is currently exercising on the Nustep for 30 minutes. Goal progressing for weight loss. Jerrett is working with the staff dietitician on ways to meet his weight loss goals. Goal progressing for developing more efficient breathing techniques such as purse lipped breathing and diaphragmatic breathing; and practicing self-pacing with activity. We will continue to monitor Chanse's progress throughout the program. Monthly review of patient's Core Components/Risk Factors/Patient Goals are as follows: Goal progressing for improving shortness of breath. Garlon is currently using 2L O2 while exercising to keep sats >88%. He is currently exercising on the Nustep and walking the track.  Goal progressing for weight loss. Renald is working with the staff dietitician on ways to meet his weight loss goals. Goal progressing for developing more efficient breathing techniques such as purse lipped breathing and diaphragmatic breathing; and practicing self-pacing with activity. We will continue to monitor Janziel's progress throughout the program.      Expected Outcomes To improve shortness of breath with ADL's, develop  more efficient breathing techniques such as purse lipped breathing and diaphragmatic breathing; and practicing self-pacing with activity and lose weight. To improve shortness of breath with ADL's, develop more efficient breathing techniques such as purse lipped breathing and diaphragmatic breathing; and practicing self-pacing with activity and lose weight.         Core Components/Risk Factors/Patient Goals at Discharge (Final Review):   Goals and Risk Factor Review - 10/19/23 0944       Core Components/Risk Factors/Patient Goals Review   Personal Goals Review Weight Management/Obesity;Improve shortness of breath with ADL's;Develop more efficient breathing techniques such as purse lipped breathing and diaphragmatic breathing and practicing self-pacing with activity.    Review Monthly review of patient's Core Components/Risk Factors/Patient Goals are as follows: Goal progressing for improving shortness of breath. Bertis is currently using 2L O2 while exercising to keep sats >88%. He is currently exercising on the Nustep and walking the track. Goal progressing for weight loss. Keevan is working with the staff dietitician on ways to meet his weight loss goals. Goal progressing for developing more efficient breathing techniques such as purse lipped breathing and diaphragmatic breathing; and practicing self-pacing with activity. We will continue to monitor Sade's progress throughout the program.    Expected Outcomes To improve shortness of breath with ADL's, develop more  efficient breathing techniques such as purse lipped breathing and diaphragmatic breathing; and practicing self-pacing with activity and lose weight.          ITP Comments:Pt is making expected progress toward Pulmonary Rehab goals after completing 11 session(s). Recommend continued exercise, life style modification, education, and utilization of breathing techniques to increase stamina and strength, while also decreasing shortness of breath with exertion.  Dr. Slater Staff is Medical Director for Pulmonary Rehab at [facility].

## 2023-10-26 NOTE — Telephone Encounter (Signed)
 PFT stabl;e 18-20 months atleast  Plan  - get echo - last echo was 1 year ago. Need to ensure no Pulm HTN

## 2023-10-27 ENCOUNTER — Other Ambulatory Visit: Payer: Self-pay

## 2023-10-27 ENCOUNTER — Encounter (HOSPITAL_COMMUNITY)
Admission: RE | Admit: 2023-10-27 | Discharge: 2023-10-27 | Disposition: A | Discharge status: Home / Self Care | Source: Ambulatory Visit | Attending: Internal Medicine | Admitting: Internal Medicine

## 2023-10-27 DIAGNOSIS — J84112 Idiopathic pulmonary fibrosis: Secondary | ICD-10-CM | POA: Diagnosis not present

## 2023-10-27 MED ORDER — ATORVASTATIN CALCIUM 80 MG PO TABS: 80.0000 mg | ORAL_TABLET | Freq: Every day | ORAL | 3 refills | Status: AC

## 2023-10-27 NOTE — Telephone Encounter (Signed)
 I called and spoke with the pt's spouse  MR has no sooner openings  Pt wants to wait until scheduled ov with MR 12/29/23 He is not having acute symptoms and I advised to call sooner if needed  Nothing further needed

## 2023-10-27 NOTE — Progress Notes (Signed)
 Daily Session Note  Patient Details  Name: Robert Lang MRN: [id number] Date of Birth: 1950/08/28 Referring Provider:   Conrad Ports Pulmonary Rehab Walk Test from 09/07/2023 in [facility] for Heart, Vascular, & Lung Health  Referring Provider Ramaswamy    Encounter Date: 10/27/2023  Check In:  Session Check In - 10/27/23 1123       Check-In   Supervising physician immediately available to respond to emergencies [facility] MD immediately available    Physician(s) Orren Fabry, NP    [facility]    Staff Present Johnnie Moats, MS, ACSM-CEP, Exercise Physiologist;Deyani Hegarty Harvy, RN, BSN;Casey Claudene, RT;Randi Reeve BS, ACSM-CEP, Exercise Physiologist    Virtual Visit No    Medication changes reported     No    Fall or balance concerns reported    No    Tobacco Cessation No Change    Warm-up and Cool-down Performed as group-led instruction    Resistance Training Performed Yes    VAD Patient? No    PAD/SET Patient? No      Pain Assessment   Currently in Pain? No/denies    Multiple Pain Sites No          Capillary Blood Glucose: No results found for this or any previous visit (from the past 24 hours).    Social History   Tobacco Use  Smoking Status Former   Current packs/day: 0.00   Average packs/day: 1 pack/day for 30.0 years (30.0 ttl pk-yrs)   Types: Cigarettes   Start date: 01/09/1989   Quit date: 01/10/2019   Years since quitting: 4.7  Smokeless Tobacco Former    Goals Met:  Independence with exercise equipment Exercise tolerated well Queuing for purse lip breathing No report of concerns or symptoms today Strength training completed today  Goals Unmet:  Not Applicable  Comments: Service time is from 1011 to 1140    Dr. Slater Staff is Medical Director for Pulmonary Rehab at [facility].

## 2023-10-29 ENCOUNTER — Other Ambulatory Visit: Payer: Self-pay | Admitting: Cardiology

## 2023-10-31 NOTE — Telephone Encounter (Signed)
 Prescription refill request for Eliquis  received. Indication:afib Last office visit:5/25 Scr:0.79  3/25 Age: 73 Weight:93.1  kg  Prescription refilled

## 2023-11-01 ENCOUNTER — Encounter (HOSPITAL_COMMUNITY)
Admission: RE | Admit: 2023-11-01 | Discharge: 2023-11-01 | Disposition: A | Discharge status: Home / Self Care | Source: Ambulatory Visit | Attending: Internal Medicine

## 2023-11-01 DIAGNOSIS — J84112 Idiopathic pulmonary fibrosis: Secondary | ICD-10-CM

## 2023-11-01 NOTE — Progress Notes (Signed)
 Daily Session Note  Patient Details  Name: Robert Lang MRN: [id number] Date of Birth: 14-Jan-1951 Referring Provider:   Conrad Ports Pulmonary Rehab Walk Test from 09/07/2023 in [facility] for Heart, Vascular, & Lung Health  Referring Provider Ramaswamy    Encounter Date: 11/01/2023  Check In:  Session Check In - 11/01/23 9061       Check-In   Supervising physician immediately available to respond to emergencies [facility] immediately available    Physician(s) Rosabel Mose, NP    [facility]    Staff Present Johnnie Moats, MS, ACSM-CEP, Exercise Physiologist;Cairo Agostinelli Claudene, RT    Virtual Visit No    Medication changes reported     No    Fall or balance concerns reported    No    Tobacco Cessation No Change    Warm-up and Cool-down Performed as group-led instruction    Resistance Training Performed Yes    VAD Patient? No    PAD/SET Patient? No      Pain Assessment   Currently in Pain? No/denies    Multiple Pain Sites No          Capillary Blood Glucose: No results found for this or any previous visit (from the past 24 hours).    Social History   Tobacco Use  Smoking Status Former   Current packs/day: 0.00   Average packs/day: 1 pack/day for 30.0 years (30.0 ttl pk-yrs)   Types: Cigarettes   Start date: 01/09/1989   Quit date: 01/10/2019   Years since quitting: 4.8  Smokeless Tobacco Former    Goals Met:  Proper associated with RPD/PD & O2 Sat Independence with exercise equipment Exercise tolerated well No report of concerns or symptoms today Strength training completed today  Goals Unmet:  Not Applicable  Comments: Service time is from 1002 to 1142.    Dr. Slater Staff is Medical Director for Pulmonary Rehab at [facility].

## 2023-11-03 ENCOUNTER — Encounter (HOSPITAL_COMMUNITY)
Admission: RE | Admit: 2023-11-03 | Discharge: 2023-11-03 | Disposition: A | Discharge status: Home / Self Care | Source: Ambulatory Visit | Attending: Internal Medicine | Admitting: Internal Medicine

## 2023-11-03 DIAGNOSIS — J84112 Idiopathic pulmonary fibrosis: Secondary | ICD-10-CM | POA: Diagnosis not present

## 2023-11-03 NOTE — Progress Notes (Signed)
 Daily Session Note  Patient Details  Name: Robert Lang MRN: [id number] Date of Birth: 1951-01-20 Referring Provider:   Conrad Ports Pulmonary Rehab Walk Test from 09/07/2023 in [facility] for Heart, Vascular, & Lung Health  Referring Provider Ramaswamy    Encounter Date: 11/03/2023  Check In:  Session Check In - 11/03/23 1203       Check-In   Supervising physician immediately available to respond to emergencies [facility] immediately available    Physician(s) Damien Braver, NP    [facility]    Staff Present Johnnie Moats, MS, ACSM-CEP, Exercise Physiologist;Roshaunda Starkey Claudene Candia Levin, RN, BSN;Carlette Carlton, RN, BSN    Virtual Visit No    Medication changes reported     No    Fall or balance concerns reported    No    Tobacco Cessation No Change    Warm-up and Cool-down Performed as group-led Writer Performed Yes    VAD Patient? No    PAD/SET Patient? No      Pain Assessment   Currently in Pain? No/denies    Multiple Pain Sites No          Capillary Blood Glucose: No results found for this or any previous visit (from the past 24 hours).    Social History   Tobacco Use  Smoking Status Former   Current packs/day: 0.00   Average packs/day: 1 pack/day for 30.0 years (30.0 ttl pk-yrs)   Types: Cigarettes   Start date: 01/09/1989   Quit date: 01/10/2019   Years since quitting: 4.8  Smokeless Tobacco Former    Goals Met:  Proper associated with RPD/PD & O2 Sat Independence with exercise equipment Exercise tolerated well No report of concerns or symptoms today Strength training completed today  Goals Unmet:  Not Applicable  Comments: Service time is from 1008 to 1138.    Dr. Slater Staff is Medical Director for Pulmonary Rehab at [facility].

## 2023-11-07 ENCOUNTER — Other Ambulatory Visit: Payer: Self-pay | Admitting: Internal Medicine

## 2023-11-07 DIAGNOSIS — Z122 Encounter for screening for malignant neoplasm of respiratory organs: Secondary | ICD-10-CM

## 2023-11-07 DIAGNOSIS — J84112 Idiopathic pulmonary fibrosis: Secondary | ICD-10-CM

## 2023-11-07 DIAGNOSIS — R0600 Dyspnea, unspecified: Secondary | ICD-10-CM

## 2023-11-07 DIAGNOSIS — I2693 Single subsegmental pulmonary embolism without acute cor pulmonale: Secondary | ICD-10-CM | POA: Diagnosis not present

## 2023-11-07 DIAGNOSIS — I10 Essential (primary) hypertension: Secondary | ICD-10-CM | POA: Diagnosis not present

## 2023-11-07 DIAGNOSIS — A419 Sepsis, unspecified organism: Secondary | ICD-10-CM | POA: Diagnosis not present

## 2023-11-07 DIAGNOSIS — Z86711 Personal history of pulmonary embolism: Secondary | ICD-10-CM | POA: Diagnosis not present

## 2023-11-07 DIAGNOSIS — R748 Abnormal levels of other serum enzymes: Secondary | ICD-10-CM

## 2023-11-07 DIAGNOSIS — R0902 Hypoxemia: Secondary | ICD-10-CM

## 2023-11-07 DIAGNOSIS — Z5181 Encounter for therapeutic drug level monitoring: Secondary | ICD-10-CM

## 2023-11-08 ENCOUNTER — Encounter (HOSPITAL_COMMUNITY)
Admission: RE | Admit: 2023-11-08 | Discharge: 2023-11-08 | Disposition: A | Discharge status: Home / Self Care | Source: Ambulatory Visit | Attending: Internal Medicine

## 2023-11-08 VITALS — Wt 206.6 lb

## 2023-11-08 DIAGNOSIS — J84112 Idiopathic pulmonary fibrosis: Secondary | ICD-10-CM

## 2023-11-08 NOTE — Progress Notes (Signed)
 Daily Session Note  Patient Details  Name: Robert Lang MRN: [id number] Date of Birth: 1950-11-13 Referring Provider:   Conrad Ports Pulmonary Rehab Walk Test from 09/07/2023 in [facility] for Heart, Vascular, & Lung Health  Referring Provider Ramaswamy    Encounter Date: 11/08/2023  Check In:  Session Check In - 11/08/23 1159       Check-In   Supervising physician immediately available to respond to emergencies [facility] immediately available    Physician(s) Lum Louis, NP    [facility]    Staff Present Johnnie Moats, MS, ACSM-CEP, Exercise Physiologist;Casey Claudene, Candia Levin, RN, BSN;Carlette Bernett, RN, BSN;Theadora Byes, RN, MHA;David Makemson, MS, ACSM-CEP, CCRP, Exercise Physiologist    Virtual Visit No    Medication changes reported     No    Fall or balance concerns reported    No    Tobacco Cessation No Change    Warm-up and Cool-down Performed as group-led instruction    Resistance Training Performed Yes    VAD Patient? No    PAD/SET Patient? No      Pain Assessment   Currently in Pain? No/denies    Multiple Pain Sites No          Capillary Blood Glucose: No results found for this or any previous visit (from the past 24 hours).   Exercise Prescription Changes - 11/08/23 1200       Response to Exercise   Blood Pressure (Admit) 112/56    Blood Pressure (Exercise) 130/60    Blood Pressure (Exit) 120/68    Heart Rate (Admit) 83 bpm    Heart Rate (Exercise) 108 bpm    Heart Rate (Exit) 89 bpm    Oxygen  Saturation (Admit) 94 %   2l   Oxygen  Saturation (Exercise) 94 %   2L   Oxygen  Saturation (Exit) 95 %   2L   Rating of Perceived Exertion (Exercise) 15    Perceived Dyspnea (Exercise) 2    Duration Continue with 30 min of aerobic exercise without signs/symptoms of physical distress.    Intensity THRR unchanged      Progression   Progression Continue to progress workloads to maintain  intensity without signs/symptoms of physical distress.      Resistance Training   Training Prescription Yes    Weight red bands    Reps 10-15    Time 10 Minutes      Oxygen    Oxygen  Continuous    Liters 2      NuStep   Level 3    SPM 89    Minutes 15    METs 2.3      Track   Laps 8    Minutes 15    METs 2.23      Oxygen    Maintain Oxygen  Saturation 88% or higher          Social History   Tobacco Use  Smoking Status Former   Current packs/day: 0.00   Average packs/day: 1 pack/day for 30.0 years (30.0 ttl pk-yrs)   Types: Cigarettes   Start date: 01/09/1989   Quit date: 01/10/2019   Years since quitting: 4.8  Smokeless Tobacco Former    Goals Met:  Exercise tolerated well Queuing for purse lip breathing No report of concerns or symptoms today Strength training completed today  Goals Unmet:  Not Applicable  Comments: Service time is from 1018 to 1139    Dr. Slater Staff is Medical Director for Pulmonary  Rehab at [facility].

## 2023-11-09 DIAGNOSIS — I10 Essential (primary) hypertension: Secondary | ICD-10-CM | POA: Diagnosis not present

## 2023-11-09 DIAGNOSIS — Z86711 Personal history of pulmonary embolism: Secondary | ICD-10-CM | POA: Diagnosis not present

## 2023-11-09 DIAGNOSIS — I2693 Single subsegmental pulmonary embolism without acute cor pulmonale: Secondary | ICD-10-CM | POA: Diagnosis not present

## 2023-11-09 DIAGNOSIS — A419 Sepsis, unspecified organism: Secondary | ICD-10-CM | POA: Diagnosis not present

## 2023-11-10 ENCOUNTER — Encounter (HOSPITAL_COMMUNITY)
Admission: RE | Admit: 2023-11-10 | Discharge: 2023-11-10 | Disposition: A | Discharge status: Home / Self Care | Source: Ambulatory Visit | Attending: Internal Medicine | Admitting: Internal Medicine

## 2023-11-10 DIAGNOSIS — J84112 Idiopathic pulmonary fibrosis: Secondary | ICD-10-CM

## 2023-11-10 NOTE — Progress Notes (Signed)
 Daily Session Note  Patient Details  Name: Robert Lang MRN: [id number] Date of Birth: [birth year] Referring Provider:   Conrad Ports Pulmonary Rehab Walk Test from 09/07/2023 in [facility] for Heart, Vascular, & Lung Health  Referring Provider Ramaswamy    Encounter Date: 11/10/2023  Check In:  Session Check In - 11/10/23 1038       Check-In   Supervising physician immediately available to respond to emergencies [facility] immediately available (P)     Physician(s) Rosaline Bane, NP (P)     [facility] (P)     Staff Present Johnnie Moats, MS, ACSM-CEP, Exercise Physiologist;Casey Claudene Candia Levin, RN, BSN;Theadora Byes, RN, MHA;David Makemson, MS, ACSM-CEP, CCRP, Exercise Physiologist;Othel Hoogendoorn [employer], ACSM-CEP, Exercise Physiologist (P)     Virtual Visit No (P)     Medication changes reported     No (P)     Fall or balance concerns reported    No (P)     Tobacco Cessation No Change (P)     Warm-up and Cool-down Performed as group-led instruction (P)     Resistance Training Performed Yes (P)     VAD Patient? No (P)           Capillary Blood Glucose: No results found for this or any previous visit (from the past 24 hours).    Social History   Tobacco Use  Smoking Status Former   Current packs/day: 0.00   Average packs/day: 1 pack/day for 30.0 years (30.0 ttl pk-yrs)   Types: Cigarettes   Start date: 01/09/1989   Quit date: 01/10/2019   Years since quitting: 4.8  Smokeless Tobacco Former    Goals Met:  Exercise tolerated well No report of concerns or symptoms today Strength training completed today  Goals Unmet:  Not Applicable  Comments: Service time is from 1018 to 1140.    Dr. Slater Staff is Medical Director for Pulmonary Rehab at [facility].

## 2023-11-14 ENCOUNTER — Telehealth: Payer: Self-pay | Admitting: Medical

## 2023-11-14 ENCOUNTER — Ambulatory Visit (HOSPITAL_BASED_OUTPATIENT_CLINIC_OR_DEPARTMENT_OTHER)
Admission: RE | Admit: 2023-11-14 | Discharge: 2023-11-14 | Disposition: A | Discharge status: Home / Self Care | Source: Ambulatory Visit | Attending: Internal Medicine | Admitting: Internal Medicine

## 2023-11-14 DIAGNOSIS — R0600 Dyspnea, unspecified: Secondary | ICD-10-CM | POA: Diagnosis not present

## 2023-11-14 DIAGNOSIS — R0609 Other forms of dyspnea: Secondary | ICD-10-CM

## 2023-11-14 LAB — ECHOCARDIOGRAM COMPLETE
AR max vel: 2.72 cm2
AV Area VTI: 2.72 cm2
AV Area mean vel: 2.86 cm2
AV Mean grad: 3 mmHg
AV Peak grad: 5.2 mmHg
Ao pk vel: 1.14 m/s
Area-P 1/2: 3.54 cm2
Calc EF: 59.6 %
MV M vel: 4.67 m/s
MV Peak grad: 87.2 mmHg
S' Lateral: 3 cm
Single Plane A2C EF: 60.9 %
Single Plane A4C EF: 60.3 %

## 2023-11-14 NOTE — Telephone Encounter (Signed)
 Forms placed in PCP folder.

## 2023-11-14 NOTE — Telephone Encounter (Signed)
 Pts wife drop off papers to be filled out by pcp. Please call pt or pts wife when papers have been faxed. Left papers in pcps box.

## 2023-11-15 ENCOUNTER — Encounter (HOSPITAL_COMMUNITY)
Admission: RE | Admit: 2023-11-15 | Discharge: 2023-11-15 | Disposition: A | Discharge status: Home / Self Care | Source: Ambulatory Visit | Attending: Internal Medicine

## 2023-11-15 ENCOUNTER — Ambulatory Visit: Payer: Self-pay | Admitting: Internal Medicine

## 2023-11-15 DIAGNOSIS — J84112 Idiopathic pulmonary fibrosis: Secondary | ICD-10-CM | POA: Diagnosis not present

## 2023-11-15 NOTE — Progress Notes (Signed)
 ECHO is fine.

## 2023-11-15 NOTE — Progress Notes (Signed)
 Daily Session Note  Patient Details  Name: Robert Lang MRN: [id number] Date of Birth: [birth year] Referring Provider:   Conrad Ports Pulmonary Rehab Walk Test from 09/07/2023 in [facility] for Heart, Vascular, & Lung Health  Referring Provider Ramaswamy    Encounter Date: 11/15/2023  Check In:  Session Check In - 11/15/23 1048       Check-In   Supervising physician immediately available to respond to emergencies [facility] immediately available    Physician(s) Rosaline Bane, NP    [facility]    Staff Present Johnnie Moats, MS, ACSM-CEP, Exercise Physiologist;Fredna Stricker Claudene Candia Levin, RN, BSN;Randi Reeve BS, ACSM-CEP, Exercise Physiologist    Virtual Visit No    Medication changes reported     No    Fall or balance concerns reported    No    Tobacco Cessation No Change    Warm-up and Cool-down Performed as group-led instruction    Resistance Training Performed Yes    VAD Patient? No    PAD/SET Patient? No      Pain Assessment   Currently in Pain? No/denies    Multiple Pain Sites No          Capillary Blood Glucose: No results found for this or any previous visit (from the past 24 hours).    Social History   Tobacco Use  Smoking Status Former   Current packs/day: 0.00   Average packs/day: 1 pack/day for 30.0 years (30.0 ttl pk-yrs)   Types: Cigarettes   Start date: 01/09/1989   Quit date: 01/10/2019   Years since quitting: 4.8  Smokeless Tobacco Former    Goals Met:  Proper associated with RPD/PD & O2 Sat Independence with exercise equipment Exercise tolerated well No report of concerns or symptoms today Strength training completed today  Goals Unmet:  Not Applicable  Comments: Service time is from 1009 to 1134.    Dr. Slater Staff is Medical Director for Pulmonary Rehab at [facility].

## 2023-11-17 ENCOUNTER — Other Ambulatory Visit (HOSPITAL_BASED_OUTPATIENT_CLINIC_OR_DEPARTMENT_OTHER)

## 2023-11-17 ENCOUNTER — Encounter (HOSPITAL_COMMUNITY)
Admission: RE | Admit: 2023-11-17 | Discharge: 2023-11-17 | Disposition: A | Discharge status: Home / Self Care | Source: Ambulatory Visit | Attending: Internal Medicine

## 2023-11-17 DIAGNOSIS — J84112 Idiopathic pulmonary fibrosis: Secondary | ICD-10-CM | POA: Diagnosis not present

## 2023-11-17 NOTE — Progress Notes (Signed)
 Daily Session Note  Patient Details  Name: Robert Lang MRN: [id number] Date of Birth: December 05, 1950 Referring Provider:   Conrad Ports Pulmonary Rehab Walk Test from 09/07/2023 in [facility] for Heart, Vascular, & Lung Health  Referring Provider Ramaswamy    Encounter Date: 11/17/2023  Check In:  Session Check In - 11/17/23 1156       Check-In   Supervising physician immediately available to respond to emergencies [facility] immediately available    Physician(s) Jackee Alberts, NP    [facility]    Staff Present Johnnie Moats, MS, ACSM-CEP, Exercise Physiologist;Casey Claudene Candia Levin, RN, BSN;Randi Midge BS, ACSM-CEP, Exercise Physiologist;Carlette Bernett, RN, BSN    Virtual Visit No    Medication changes reported     No    Fall or balance concerns reported    No    Tobacco Cessation No Change    Warm-up and Cool-down Performed as group-led instruction    Resistance Training Performed Yes    VAD Patient? No    PAD/SET Patient? No      Pain Assessment   Currently in Pain? No/denies    Multiple Pain Sites No          Capillary Blood Glucose: No results found for this or any previous visit (from the past 24 hours).    Social History   Tobacco Use  Smoking Status Former   Current packs/day: 0.00   Average packs/day: 1 pack/day for 30.0 years (30.0 ttl pk-yrs)   Types: Cigarettes   Start date: 01/09/1989   Quit date: 01/10/2019   Years since quitting: 4.8  Smokeless Tobacco Former    Goals Met:  Proper associated with RPD/PD & O2 Sat Independence with exercise equipment Exercise tolerated well No report of concerns or symptoms today Strength training completed today  Goals Unmet:  Not Applicable  Comments: Service time is from 1007 to 1138.    Dr. Slater Staff is Medical Director for Pulmonary Rehab at [facility].

## 2023-11-17 NOTE — Progress Notes (Signed)
 Home Exercise Prescription I have reviewed a Home Exercise Prescription with Robert Lang. Robert Lang is currently exercising at home. He is climbing his stairs in his house 5-7 days/wk for 12-15 min/day. Robert Lang wants to walk outside. I encouraged him to walk at least 3 days/wk for 30 min/day. He does have a rollator at home he can use to walk. Robert Lang agreed with my recommendations. I also went over our conversation with his spouse. She is unsure how well he will exercise at home. Robert Lang seems motivated to exercise outside of rehab. The patient stated that their goals were to to maintain. We reviewed exercise guidelines, target heart rate during exercise, RPE Scale, weather conditions, endpoints for exercise, warmup and cool down. The patient is encouraged to come to me with any questions. I will continue to follow up with the patient to assist them with progression and safety. Spent 15 min with patient discussing home exercise plan and goals  Robert JINNY Moats, MS, ACSM-CEP 11/17/2023 3:50 PM

## 2023-11-22 ENCOUNTER — Encounter (HOSPITAL_COMMUNITY)
Admission: RE | Admit: 2023-11-22 | Discharge: 2023-11-22 | Disposition: A | Discharge status: Home / Self Care | Source: Ambulatory Visit | Attending: Internal Medicine | Admitting: Internal Medicine

## 2023-11-22 VITALS — Wt 205.9 lb

## 2023-11-22 DIAGNOSIS — J84112 Idiopathic pulmonary fibrosis: Secondary | ICD-10-CM | POA: Insufficient documentation

## 2023-11-22 NOTE — Progress Notes (Signed)
 Daily Session Note  Patient Details  Name: Artin Mceuen MRN: [id number] Date of Birth: Jun 13, 1950 Referring Provider:   Conrad Ports Pulmonary Rehab Walk Test from 09/07/2023 in [facility] for Heart, Vascular, & Lung Health  Referring Provider Ramaswamy    Encounter Date: 11/22/2023  Check In:  Session Check In - 11/22/23 1148       Check-In   Supervising physician immediately available to respond to emergencies [facility] MD immediately available    Physician(s) Josefa Beauvais, NP    [facility]    Staff Present Johnnie Moats, MS, ACSM-CEP, Exercise Physiologist;Casey Claudene Candia Levin, RN, BSN;Randi Reeve BS, ACSM-CEP, Exercise Physiologist    Virtual Visit No    Medication changes reported     No    Fall or balance concerns reported    No    Tobacco Cessation No Change    Warm-up and Cool-down Performed as group-led instruction    Resistance Training Performed Yes    VAD Patient? No    PAD/SET Patient? No      Pain Assessment   Currently in Pain? No/denies    Multiple Pain Sites No          Capillary Blood Glucose: No results found for this or any previous visit (from the past 24 hours).   Exercise Prescription Changes - 11/22/23 1100       Response to Exercise   Blood Pressure (Admit) 126/64    Blood Pressure (Exercise) 134/60    Blood Pressure (Exit) 114/60    Heart Rate (Admit) 83 bpm    Heart Rate (Exercise) 108 bpm    Heart Rate (Exit) 100 bpm    Oxygen  Saturation (Admit) 95 %    Oxygen  Saturation (Exercise) 94 %    Oxygen  Saturation (Exit) 95 %    Rating of Perceived Exertion (Exercise) 14    Perceived Dyspnea (Exercise) 2    Duration Continue with 30 min of aerobic exercise without signs/symptoms of physical distress.    Intensity THRR unchanged      Progression   Progression Continue to progress workloads to maintain intensity without signs/symptoms of physical distress.      Resistance Training    Training Prescription Yes    Weight red bands    Reps 10-15    Time 10 Minutes      Oxygen    Oxygen  Continuous    Liters 2      NuStep   Level 3    SPM 99    Minutes 15    METs 2.7      Track   Laps 9    Minutes 15    METs 2.38      Oxygen    Maintain Oxygen  Saturation 88% or higher          Social History   Tobacco Use  Smoking Status Former   Current packs/day: 0.00   Average packs/day: 1 pack/day for 30.0 years (30.0 ttl pk-yrs)   Types: Cigarettes   Start date: 01/09/1989   Quit date: 01/10/2019   Years since quitting: 4.8  Smokeless Tobacco Former    Goals Met:  Proper associated with RPD/PD & O2 Sat Independence with exercise equipment Exercise tolerated well No report of concerns or symptoms today Strength training completed today  Goals Unmet:  Not Applicable  Comments: Service time is from 1008 to 1130.    Dr. Slater Staff is Medical Director for Pulmonary Rehab at [facility].

## 2023-11-23 NOTE — Progress Notes (Signed)
 Pulmonary Individual Treatment Plan  Patient Details  Name: Robert Lang MRN: [id number] Date of Birth: 02/09/1951 Referring Provider:   Conrad Ports Pulmonary Rehab Walk Test from 09/07/2023 in [facility]  Referring Provider Ramaswamy    Initial Encounter Date:  Flowsheet Row Pulmonary Rehab Walk Test from 09/07/2023 in [facility]  Date 09/07/23    Visit Diagnosis: IPF (idiopathic pulmonary fibrosis) (HCC)  Patient's Home Medications on Admission:   Current Outpatient Medications:    acetaminophen  (TYLENOL ) 500 MG tablet, Take 1 tablet (500 mg total) by mouth every 8 (eight) hours as needed for moderate pain., Disp: 100 tablet, Rfl: 0   atorvastatin  (LIPITOR ) 80 MG tablet, Take 1 tablet (80 mg total) by mouth daily., Disp: 90 tablet, Rfl: 3   buPROPion  (WELLBUTRIN  XL) 150 MG 24 hr tablet, Take 1 tablet (150 mg total) by mouth daily., Disp: 30 tablet, Rfl: 3   busPIRone  (BUSPAR ) 15 MG tablet, Take 1 tablet (15 mg total) by mouth 3 (three) times daily., Disp: 90 tablet, Rfl: 3   Cholecalciferol (VITAMIN D3) 50 MCG (2000 UT) TABS, Take 4,000 Units by mouth 2 (two) times daily., Disp: , Rfl:    docusate sodium  (COLACE) 100 MG capsule, Take 100 mg by mouth daily., Disp: , Rfl:    ELIQUIS  5 MG TABS tablet, TAKE 1 TABLET(5 MG) BY MOUTH TWICE DAILY, Disp: 60 tablet, Rfl: 5   famotidine  (PEPCID ) 40 MG tablet, Take 1 tablet (40 mg total) by mouth daily. (Patient not taking: Reported on 09/07/2023), Disp: 90 tablet, Rfl: 3   fexofenadine (ALLEGRA) 180 MG tablet, Take 180 mg by mouth at bedtime. , Disp: , Rfl:    finasteride  (PROSCAR ) 5 MG tablet, Take 1 tablet (5 mg total) by mouth daily., Disp: 90 tablet, Rfl: 1   lamoTRIgine  (LAMICTAL ) 100 MG tablet, Take 1 tablet (100 mg total) by mouth daily., Disp: 90 tablet, Rfl: 1   levalbuterol  (XOPENEX ) 1.25 MG/0.5ML nebulizer  solution, Take 1.25 mg by nebulization every 4 (four) hours as needed for wheezing or shortness of breath., Disp: 1 each, Rfl: 12   LORazepam  (ATIVAN ) 0.5 MG tablet, TAKE 1 TABLET BY MOUTH ONLY FOR SEVERE ANXIETY OR AGITATION, Disp: 30 tablet, Rfl: 3   Melatonin 10 MG TABS, Take 10 mg by mouth at bedtime. (Patient not taking: Reported on 09/07/2023), Disp: , Rfl:    MUCINEX  600 MG 12 hr tablet, TAKE 2 TABLETS BY MOUTH TWICE DAILY, Disp: 120 tablet, Rfl: 0   Multiple Vitamins-Minerals (PRESERVISION AREDS PO), Take 1 capsule by mouth in the morning and at bedtime., Disp: , Rfl:    nitroGLYCERIN  (NITROSTAT ) 0.4 MG SL tablet, Place 1 tablet (0.4 mg total) under the tongue every 5 (five) minutes as needed for chest pain., Disp: 25 tablet, Rfl: 3   ondansetron  (ZOFRAN ) 4 MG tablet, Take 1 tablet (4 mg total) by mouth every 8 (eight) hours as needed for nausea or vomiting., Disp: 20 tablet, Rfl: 0   OXYGEN , Inhale 2 L into the lungs daily., Disp: , Rfl:    pantoprazole  (PROTONIX ) 40 MG tablet, Take 1 tablet (40 mg total) by mouth daily., Disp: 30 tablet, Rfl: 6   Pirfenidone  (ESBRIET ) 801 MG TABS, Take 1 tablet (801 mg total) by mouth 3 (three) times daily., Disp: 270 tablet, Rfl: 1   polyethylene glycol powder (GLYCOLAX /MIRALAX ) 17 GM/SCOOP powder, Take 17 g by mouth 2 (two) times  daily., Disp: , Rfl:    pregabalin  (LYRICA ) 150 MG capsule, TAKE 1 CAPSULE(150 MG) BY MOUTH TWICE DAILY, Disp: 60 capsule, Rfl: 5   ranolazine  (RANEXA ) 1000 MG SR tablet, Take 1 tablet (1,000 mg total) by mouth 2 (two) times daily., Disp: 180 tablet, Rfl: 2   sertraline  (ZOLOFT ) 100 MG tablet, TAKE 2 TABLETS(200 MG) BY MOUTH AT BEDTIME, Disp: 180 tablet, Rfl: 0   traMADol  (ULTRAM ) 50 MG tablet, Take 1 tablet (50 mg total) by mouth at bedtime., Disp: 30 tablet, Rfl: 5   traZODone  (DESYREL ) 50 MG tablet, TAKE 1 TABLET(50 MG) BY MOUTH AT BEDTIME, Disp: 90 tablet, Rfl: 0   vitamin B-12 (CYANOCOBALAMIN ) 1000 MCG tablet, Take 1,000 mcg  by mouth daily., Disp: , Rfl:    vitamin E 1000 UNIT capsule, Take 1,000 Units by mouth 2 (two) times daily at 8 am and 10 pm., Disp: , Rfl:    zolpidem  (AMBIEN ) 5 MG tablet, Take 1 tablet (5 mg total) by mouth at bedtime., Disp: 30 tablet, Rfl: 5 No current facility-administered medications for this encounter.  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 M75.00)   Allergic rhinitis due to pollen 09/03/2013   J30.1)  Formatting of this note might be different from the original. J30.1)   Allergy    Anticoagulated 07/01/2016   Anxiety    Arthritis    Atrial fibrillation (HCC)    Atrial flutter (HCC) 06/26/2015   CAD (coronary artery disease)    Chest pain 06/26/2015   COPD (chronic obstructive pulmonary disease) (HCC)    Coronary artery disease 07/06/2018   Cardiac catheterization 2017 showing 90% small diagonal branch disease   DDD (degenerative disc disease), cervical 09/03/2013   M50.90)  Formatting of this note might be different from the original. M50.90)   Depression    Dizziness 10/28/2017   Dyslipidemia, goal LDL below 70 07/06/2018   Dyspnea on exertion 10/20/2018   Dysrhythmia    Essential hypertension 07/06/2018   ETOH abuse 01/11/2019   6 pack of beer per day   Falls 10/28/2017   GERD (gastroesophageal reflux disease)    H/O amiodarone therapy 07/01/2016   Heart palpitations 01/27/2017   History of colon polyps    History of kidney stones    Hyperlipidemia     Hypertension    IPF (idiopathic pulmonary fibrosis) (HCC)    Neck pain 09/03/2013   Neuropathy 07/06/2018   Obstructive sleep apnea 08/05/2015   PLMD (periodic limb movement disorder) 11/04/2015   Polycythemia, 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 STORY: Due to alcohol / tobacco   Pure hypercholesterolemia 09/03/2013   E78.0)  Formatting of this note might be different from the original. 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/06/2018   2017    Tobacco Use: Social History   Tobacco Use  Smoking Status Former   Current packs/day: 0.00   Average packs/day: 1 pack/day for 30.0 years (30.0 ttl  pk-yrs)   Types: Cigarettes   Start date: 01/09/1989   Quit date: 01/10/2019   Years since quitting: 4.8  Smokeless Tobacco Former    Labs: Review Flowsheet  More data exists      Latest Ref Rng & Units 12/30/2021 08/28/2022 09/17/2022 03/01/2023 03/22/2023  Labs for ITP Cardiac and Pulmonary Rehab  Cholestrol 100 - 199 mg/dL 855  - - - 841   LDL (calc) 0 - 99 mg/dL - - - - 73   Direct LDL mg/dL 19.9  - - - -  HDL-C >60 mg/dL 74.99  - - - 26   Trlycerides 0 - 149 mg/dL 657.9  - - - 633   Hemoglobin A1c 4.6 - 6.5 % 5.9  - - 5.7  -  Bicarbonate 20.0 - 28.0 mmol/L - 22.7  25.4  - -  TCO2 22 - 32 mmol/L - 24  - - -  Acid-base deficit 0.0 - 2.0 mmol/L - 1.0  - - -  O2 Saturation % - 77  77.8  - -    Capillary Blood Glucose: Lab Results  Component Value Date   GLUCAP 92 08/26/2021     Pulmonary Assessment Scores:  Pulmonary Assessment Scores     Row Name 09/07/23 1135 11/10/23 0928       ADL UCSD   ADL Phase Entry Exit    SOB Score total 82 58      CAT Score   CAT Score 22 19      mMRC Score   mMRC Score 4 --      UCSD: Self-administered rating of dyspnea associated with activities of daily living (ADLs) 6-point scale (0 = not at all  to 5 = maximal or unable to do because of breathlessness)  Scoring Scores range from 0 to 120.  Minimally important difference is 5 units  CAT: CAT can identify the health impairment of COPD patients and is better correlated with disease progression.  CAT has a scoring range of zero to 40. The CAT score is classified into four groups of low (less than 10), medium (10 - 20), high (21-30) and very high (31-40) based on the impact level of disease on health status. A CAT score over 10 suggests significant symptoms.  A worsening CAT score could be explained by an exacerbation, poor medication adherence, poor inhaler technique, or progression of COPD or comorbid conditions.  CAT MCID is 2 points  mMRC: mMRC (Modified Medical Research Council) Dyspnea Scale is used to assess the degree of baseline functional disability in patients of respiratory disease due to dyspnea. No minimal important difference is established. A decrease in score of 1 point or greater is considered a positive change.   Pulmonary Function Assessment:  Pulmonary Function Assessment - 09/07/23 1112       Breath   Bilateral Breath Sounds Basilar;Rales    Shortness of Breath Yes;Limiting activity          Exercise Target Goals: Exercise Program Goal: Individual exercise prescription set using results from initial 6 min walk test and THRR while considering  patient's activity barriers and safety.   Exercise Prescription Goal: Initial exercise prescription builds to 30-45 minutes a day of aerobic activity, 2-3 days per week.  Home exercise guidelines will be given to patient during program as part of exercise prescription that the participant will acknowledge.  Activity Barriers & Risk Stratification:  Activity Barriers & Cardiac Risk Stratification - 09/07/23 1103       Activity Barriers &  Cardiac Risk Stratification   Activity Barriers Deconditioning;Muscular Weakness;Shortness of Breath;Back Problems;Balance  Concerns;History of Falls;Assistive Device    Cardiac Risk Stratification Moderate          6 Minute Walk:  6 Minute Walk     Row Name 09/07/23 1203         6 Minute Walk   Phase Initial     Distance 645 feet     Walk Time 6 minutes     # of Rest Breaks 2  1:41-2:00, 4:11-4:40     MPH 1.22     METS 1.53     RPE 13     Perceived Dyspnea  1     VO2 Peak 5.35     Symptoms No     Resting HR 89 bpm     Resting BP 98/60     Resting Oxygen  Saturation  96 %     Exercise Oxygen  Saturation  during 6 min walk 93 %     Max Ex. HR 101 bpm     Max Ex. BP 122/62     2 Minute Post BP 118/64       Interval HR   1 Minute HR 101     2 Minute HR 86     3 Minute HR 88     4 Minute HR 94     5 Minute HR 91     6 Minute HR 94     2 Minute Post HR 79     Interval Heart Rate? Yes       Interval Oxygen    Interval Oxygen ? Yes     Baseline Oxygen  Saturation % 96 %     1 Minute Oxygen  Saturation % 93 %     1 Minute Liters of Oxygen  2 L     2 Minute Oxygen  Saturation % 95 %     2 Minute Liters of Oxygen  2 L     3 Minute Oxygen  Saturation % 94 %     3 Minute Liters of Oxygen  2 L     4 Minute Oxygen  Saturation % 94 %     4 Minute Liters of Oxygen  2 L     5 Minute Oxygen  Saturation % 94 %     5 Minute Liters of Oxygen  2 L     6 Minute Oxygen  Saturation % 94 %     6 Minute Liters of Oxygen  2 L     2 Minute Post Oxygen  Saturation % 96 %     2 Minute Post Liters of Oxygen  2 L        Oxygen  Initial Assessment:  Oxygen  Initial Assessment - 09/07/23 1111       Home Oxygen    Home Oxygen  Device Portable Concentrator;Home Concentrator    Sleep Oxygen  Prescription CPAP    Liters per minute 2.5    Home Exercise Oxygen  Prescription Continuous    Liters per minute 2.5    Home Resting Oxygen  Prescription Continuous    Liters per minute 2.5    Compliance with Home Oxygen  Use Yes      Initial 6 min Walk   Oxygen  Used Continuous    Liters per minute 2      Program Oxygen  Prescription    Program Oxygen  Prescription Continuous    Liters per minute 2      Intervention   Short Term Goals To learn and exhibit compliance with exercise, home and travel O2 prescription;To learn and understand importance  of monitoring SPO2 with pulse oximeter and demonstrate accurate use of the pulse oximeter.;To learn and understand importance of maintaining oxygen  saturations>88%;To learn and demonstrate proper pursed lip breathing techniques or other breathing techniques. ;To learn and demonstrate proper use of respiratory medications    Long  Term Goals Exhibits compliance with exercise, home  and travel O2 prescription;Maintenance of O2 saturations>88%;Compliance with respiratory medication;Verbalizes importance of monitoring SPO2 with pulse oximeter and return demonstration;Exhibits proper breathing techniques, such as pursed lip breathing or other method taught during program session;Demonstrates proper use of MDI's          Oxygen  Re-Evaluation:  Oxygen  Re-Evaluation     Row Name 09/22/23 0747 10/19/23 1106 11/16/23 0745         Program Oxygen  Prescription   Program Oxygen  Prescription Continuous Continuous Continuous     Liters per minute 2 2 2        Home Oxygen    Home Oxygen  Device Portable Concentrator;Home Concentrator Portable Concentrator;Home Concentrator Portable Concentrator;Home Concentrator     Sleep Oxygen  Prescription CPAP CPAP CPAP     Liters per minute 2.5 2.5 2.5     Home Exercise Oxygen  Prescription Continuous Continuous Continuous     Liters per minute 2.5 2.5 2.5     Home Resting Oxygen  Prescription Continuous Continuous Continuous     Liters per minute 2.5 2.5 2.5     Compliance with Home Oxygen  Use Yes Yes Yes       Goals/Expected Outcomes   Short Term Goals To learn and exhibit compliance with exercise, home and travel O2 prescription;To learn and understand importance of monitoring SPO2 with pulse oximeter and demonstrate accurate use of the pulse  oximeter.;To learn and understand importance of maintaining oxygen  saturations>88%;To learn and demonstrate proper pursed lip breathing techniques or other breathing techniques. ;To learn and demonstrate proper use of respiratory medications To learn and exhibit compliance with exercise, home and travel O2 prescription;To learn and understand importance of monitoring SPO2 with pulse oximeter and demonstrate accurate use of the pulse oximeter.;To learn and understand importance of maintaining oxygen  saturations>88%;To learn and demonstrate proper pursed lip breathing techniques or other breathing techniques. ;To learn and demonstrate proper use of respiratory medications To learn and exhibit compliance with exercise, home and travel O2 prescription;To learn and understand importance of monitoring SPO2 with pulse oximeter and demonstrate accurate use of the pulse oximeter.;To learn and understand importance of maintaining oxygen  saturations>88%;To learn and demonstrate proper pursed lip breathing techniques or other breathing techniques. ;To learn and demonstrate proper use of respiratory medications     Long  Term Goals Exhibits compliance with exercise, home  and travel O2 prescription;Maintenance of O2 saturations>88%;Compliance with respiratory medication;Verbalizes importance of monitoring SPO2 with pulse oximeter and return demonstration;Exhibits proper breathing techniques, such as pursed lip breathing or other method taught during program session;Demonstrates proper use of MDI's Exhibits compliance with exercise, home  and travel O2 prescription;Maintenance of O2 saturations>88%;Compliance with respiratory medication;Verbalizes importance of monitoring SPO2 with pulse oximeter and return demonstration;Exhibits proper breathing techniques, such as pursed lip breathing or other method taught during program session;Demonstrates proper use of MDI's Exhibits compliance with exercise, home  and travel O2  prescription;Maintenance of O2 saturations>88%;Compliance with respiratory medication;Verbalizes importance of monitoring SPO2 with pulse oximeter and return demonstration;Exhibits proper breathing techniques, such as pursed lip breathing or other method taught during program session;Demonstrates proper use of MDI's     Goals/Expected Outcomes Compliance and understanding of oxygen  saturation monitoring and breathing techniques to decrease shortness  of breath. Compliance and understanding of oxygen  saturation monitoring and breathing techniques to decrease shortness of breath. Compliance and understanding of oxygen  saturation monitoring and breathing techniques to decrease shortness of breath.        Oxygen  Discharge (Final Oxygen  Re-Evaluation):  Oxygen  Re-Evaluation - 11/16/23 0745       Program Oxygen  Prescription   Program Oxygen  Prescription Continuous    Liters per minute 2      Home Oxygen    Home Oxygen  Device Portable Concentrator;Home Concentrator    Sleep Oxygen  Prescription CPAP    Liters per minute 2.5    Home Exercise Oxygen  Prescription Continuous    Liters per minute 2.5    Home Resting Oxygen  Prescription Continuous    Liters per minute 2.5    Compliance with Home Oxygen  Use Yes      Goals/Expected Outcomes   Short Term Goals To learn and exhibit compliance with exercise, home and travel O2 prescription;To learn and understand importance of monitoring SPO2 with pulse oximeter and demonstrate accurate use of the pulse oximeter.;To learn and understand importance of maintaining oxygen  saturations>88%;To learn and demonstrate proper pursed lip breathing techniques or other breathing techniques. ;To learn and demonstrate proper use of respiratory medications    Long  Term Goals Exhibits compliance with exercise, home  and travel O2 prescription;Maintenance of O2 saturations>88%;Compliance with respiratory medication;Verbalizes importance of monitoring SPO2 with pulse oximeter and  return demonstration;Exhibits proper breathing techniques, such as pursed lip breathing or other method taught during program session;Demonstrates proper use of MDI's    Goals/Expected Outcomes Compliance and understanding of oxygen  saturation monitoring and breathing techniques to decrease shortness of breath.          Initial Exercise Prescription:  Initial Exercise Prescription - 09/07/23 1200       Date of Initial Exercise RX and Referring Provider   Date 09/07/23    Referring Provider Ramaswamy    Expected Discharge Date 12/01/23      Oxygen    Oxygen  Continuous    Liters 2    Maintain Oxygen  Saturation 88% or higher      NuStep   Level 1    SPM 50    Minutes 30    METs 1.5      Prescription Details   Frequency (times per week) 2    Duration Progress to 30 minutes of continuous aerobic without signs/symptoms of physical distress      Intensity   THRR 40-80% of Max Heartrate 59-118    Ratings of Perceived Exertion 11-13    Perceived Dyspnea 0-4      Progression   Progression Continue to progress workloads to maintain intensity without signs/symptoms of physical distress.      Resistance Training   Training Prescription Yes    Weight red bands    Reps 10-15          Perform Capillary Blood Glucose checks as needed.  Exercise Prescription Changes:   Exercise Prescription Changes     Row Name 09/13/23 1200 09/27/23 1200 10/06/23 0945 10/25/23 1200 11/08/23 1200     Response to Exercise   Blood Pressure (Admit) 112/60 104/60 122/60 90/56 112/56   Blood Pressure (Exercise) 112/56 130/68 -- 120/60 130/60   Blood Pressure (Exit) 120/64 98/68 112/60 120/70 120/68   Heart Rate (Admit) 87 bpm 94 bpm 85 bpm 85 bpm 83 bpm   Heart Rate (Exercise) 88 bpm 103 bpm 113 bpm 107 bpm 108 bpm   Heart Rate (Exit) 85 bpm  99 bpm 95 bpm 95 bpm 89 bpm   Oxygen  Saturation (Admit) 96 % 94 % 96 %  2L 95 % 94 %  2l   Oxygen  Saturation (Exercise) 93 % 93 % 93 %  2L 94 % 94 %  2L    Oxygen  Saturation (Exit) 92 % 94 % 93 %  2L POC 95 % 95 %  2L   Rating of Perceived Exertion (Exercise) 13 14 13 13 15    Perceived Dyspnea (Exercise) 1 2 2 2 2    Duration Progress to 30 minutes of  aerobic without signs/symptoms of physical distress Continue with 30 min of aerobic exercise without signs/symptoms of physical distress. Continue with 30 min of aerobic exercise without signs/symptoms of physical distress. Continue with 30 min of aerobic exercise without signs/symptoms of physical distress. Continue with 30 min of aerobic exercise without signs/symptoms of physical distress.   Intensity THRR unchanged THRR unchanged THRR unchanged THRR unchanged THRR unchanged     Progression   Progression Continue to progress workloads to maintain intensity without signs/symptoms of physical distress. Continue to progress workloads to maintain intensity without signs/symptoms of physical distress. Continue to progress workloads to maintain intensity without signs/symptoms of physical distress. Continue to progress workloads to maintain intensity without signs/symptoms of physical distress. Continue to progress workloads to maintain intensity without signs/symptoms of physical distress.     Resistance Training   Training Prescription Yes Yes Yes Yes Yes   Weight red bands red bands red bands red bands red bands   Reps 10-15 10-15 10-15 10-15 10-15   Time 10 Minutes 10 Minutes 10 Minutes 10 Minutes 10 Minutes     Oxygen    Oxygen  Continuous Continuous Continuous Continuous Continuous   Liters 2 2 2 2 2      NuStep   Level 1 1 2 2 3    SPM -- 72 88 -- 89   Minutes 15 15 15 15 15    METs 1.7 1.9 2.1 2.4 2.3     Track   Laps -- 7 9 10 8    Minutes -- 15 15 15 15    METs -- 2.08 2.38 2.54 2.23     Oxygen    Maintain Oxygen  Saturation 88% or higher 88% or higher 88% or higher 88% or higher 88% or higher    Row Name 11/22/23 1100             Response to Exercise   Blood Pressure (Admit)  126/64       Blood Pressure (Exercise) 134/60       Blood Pressure (Exit) 114/60       Heart Rate (Admit) 83 bpm       Heart Rate (Exercise) 108 bpm       Heart Rate (Exit) 100 bpm       Oxygen  Saturation (Admit) 95 %       Oxygen  Saturation (Exercise) 94 %       Oxygen  Saturation (Exit) 95 %       Rating of Perceived Exertion (Exercise) 14       Perceived Dyspnea (Exercise) 2       Duration Continue with 30 min of aerobic exercise without signs/symptoms of physical distress.       Intensity THRR unchanged         Progression   Progression Continue to progress workloads to maintain intensity without signs/symptoms of physical distress.         Resistance Training   Training Prescription Yes  Weight red bands       Reps 10-15       Time 10 Minutes         Oxygen    Oxygen  Continuous       Liters 2         NuStep   Level 3       SPM 99       Minutes 15       METs 2.7         Track   Laps 9       Minutes 15       METs 2.38         Oxygen    Maintain Oxygen  Saturation 88% or higher          Exercise Comments:   Exercise Comments     Row Name 09/13/23 0742 11/17/23 1545         Exercise Comments Patient completed first day of exercise. He exercised for 30 min on the Nustep at level 1. He averaged 1.7 METs, showing deconditioning. Discussed METs with some understanding. Completed home exercise plan. [place] is currently exercising at home. He is climbing his stairs in his house 5-7 days/wk for 12-15 min/day. [place] wants to walk outside. I encouraged him to walk at least 3 days/wk for 30 min/day. He does have a rollator at home he can use to walk. [place] agreed with my recommendations. I also went over our conversation with his spouse. She is unsure how well he will exercise at home. [place] seems motivated to exercise outside of rehab.         Exercise Goals and Review:   Exercise Goals     Row Name 09/07/23 1106             Exercise Goals   Increase Physical  Activity Yes       Intervention Provide advice, education, support and counseling about physical activity/exercise needs.;Develop an individualized exercise prescription for aerobic and resistive training based on initial evaluation findings, risk stratification, comorbidities and participant's personal goals.       Expected Outcomes Short Term: Attend rehab on a regular basis to increase amount of physical activity.;Long Term: Add in home exercise to make exercise part of routine and to increase amount of physical activity.;Long Term: Exercising regularly at least 3-5 days a week.       Increase Strength and Stamina Yes       Intervention Provide advice, education, support and counseling about physical activity/exercise needs.;Develop an individualized exercise prescription for aerobic and resistive training based on initial evaluation findings, risk stratification, comorbidities and participant's personal goals.       Expected Outcomes Short Term: Increase workloads from initial exercise prescription for resistance, speed, and METs.;Short Term: Perform resistance training exercises routinely during rehab and add in resistance training at home;Long Term: Improve cardiorespiratory fitness, muscular endurance and strength as measured by increased METs and functional capacity ( )       Able to understand and use rate of perceived exertion (RPE) scale Yes       Intervention Provide education and explanation on how to use RPE scale       Expected Outcomes Short Term: Able to use RPE daily in rehab to express subjective intensity level;Long Term:  Able to use RPE to guide intensity level when exercising independently       Able to understand and use Dyspnea scale Yes       Intervention Provide education and explanation  on how to use Dyspnea scale       Expected Outcomes Short Term: Able to use Dyspnea scale daily in rehab to express subjective sense of shortness of breath during exertion;Long Term: Able to  use Dyspnea scale to guide intensity level when exercising independently       Knowledge and understanding of Target Heart Rate Range (THRR) Yes       Intervention Provide education and explanation of THRR including how the numbers were predicted and where they are located for reference       Expected Outcomes Short Term: Able to state/look up THRR;Long Term: Able to use THRR to govern intensity when exercising independently;Short Term: Able to use daily as guideline for intensity in rehab       Understanding of Exercise Prescription Yes       Intervention Provide education, explanation, and written materials on patient's individual exercise prescription       Expected Outcomes Short Term: Able to explain program exercise prescription;Long Term: Able to explain home exercise prescription to exercise independently          Exercise Goals Re-Evaluation :  Exercise Goals Re-Evaluation     Row Name 09/22/23 0743 10/19/23 1102 11/16/23 0742         Exercise Goal Re-Evaluation   Exercise Goals Review Increase Physical Activity;Able to understand and use Dyspnea scale;Understanding of Exercise Prescription;Increase Strength and Stamina;Knowledge and understanding of Target Heart Rate Range (THRR);Able to understand and use rate of perceived exertion (RPE) scale Increase Physical Activity;Able to understand and use Dyspnea scale;Understanding of Exercise Prescription;Increase Strength and Stamina;Knowledge and understanding of Target Heart Rate Range (THRR);Able to understand and use rate of perceived exertion (RPE) scale Increase Physical Activity;Able to understand and use Dyspnea scale;Understanding of Exercise Prescription;Increase Strength and Stamina;Knowledge and understanding of Target Heart Rate Range (THRR);Able to understand and use rate of perceived exertion (RPE) scale     Comments Regie has completed 3 exercise sessions. He exercises for 30 min on the Nustep. Austan averages 2 METs at  level 1 on the Nustep. He performs the warmup and cooldown standing with rest breaks as needed. It is too soon to notate any discernable progressions. Will continue to monitor and progress as able. Valdez has completed 9 exercise sessions. He exercises for 15 min on the track and Nustep. Ken averages 2.23 METs on the track and 2.6 METs at level 2 on the Nustep. He performs the warmup and cooldown standing with rest breaks as needed. India has progressed to walking the track. He tolerates walking well and takes rest breaks as needed. He has also increased his level on the Nustep. [place] seems motivated to exercise. Will continue to monitor and progress as able. Ava has completed 17 exercise sessions. He exercises for 15 min on the track and Nustep. Ken averages 2.23 METs on the track and 2.8 METs at level 3 on the Nustep. He performs the warmup and cooldown standing with rest breaks as needed. [place] has increased his level on the Nustep. His track laps remain relatively the same. [place] is possibly reaching his maximum exercise capacity. He is very limited by his chronic back pain. Will continue to monitor and progress as able.     Expected Outcomes Through exercise at rehab and home, the patient will decrease shortness of breath with daily activities and feel confident in carrying out an exercise regimen at home. Through exercise at rehab and home, the patient will decrease shortness of breath with daily  activities and feel confident in carrying out an exercise regimen at home. Through exercise at rehab and home, the patient will decrease shortness of breath with daily activities and feel confident in carrying out an exercise regimen at home.        Discharge Exercise Prescription (Final Exercise Prescription Changes):  Exercise Prescription Changes - 11/22/23 1100       Response to Exercise   Blood Pressure (Admit) 126/64    Blood Pressure (Exercise) 134/60    Blood Pressure (Exit) 114/60    Heart Rate  (Admit) 83 bpm    Heart Rate (Exercise) 108 bpm    Heart Rate (Exit) 100 bpm    Oxygen  Saturation (Admit) 95 %    Oxygen  Saturation (Exercise) 94 %    Oxygen  Saturation (Exit) 95 %    Rating of Perceived Exertion (Exercise) 14    Perceived Dyspnea (Exercise) 2    Duration Continue with 30 min of aerobic exercise without signs/symptoms of physical distress.    Intensity THRR unchanged      Progression   Progression Continue to progress workloads to maintain intensity without signs/symptoms of physical distress.      Resistance Training   Training Prescription Yes    Weight red bands    Reps 10-15    Time 10 Minutes      Oxygen    Oxygen  Continuous    Liters 2      NuStep   Level 3    SPM 99    Minutes 15    METs 2.7      Track   Laps 9    Minutes 15    METs 2.38      Oxygen    Maintain Oxygen  Saturation 88% or higher          Nutrition:  Target Goals: Understanding of nutrition guidelines, daily intake of sodium 1500mg , cholesterol 200mg , calories 30% from fat and 7% or less from saturated fats, daily to have 5 or more servings of fruits and vegetables.  Biometrics:    Nutrition Therapy Plan and Nutrition Goals:  Nutrition Therapy & Goals - 10/20/23 1459       Nutrition Therapy   Diet General Healthy Diet    Drug/Food Interactions Statins/Certain Fruits      Personal Nutrition Goals   Nutrition Goal Patient to improve diet quality by using the plate method as a guide for meal planning to include lean protein/plant protein, fruits, vegetables, whole grains, nonfat dairy as part of a well-balanced diet.   goal in progress.   Personal Goal #2 Patient to identify strategies for weight loss of 0.5-2.0# per week.   goal not met.   Comments Goals in progress. Patient has medical history of coronary artery disease s/p PTCA and stenting of LAD and RCA, atrial flutter status post ablation done in 2017, essential hypertension, dyslipidemia, COPD, IPF. He continues  esbriet  for treatment of IPF; he did have initial unintentional weight loss when started on medication in 2022 but weight has been stable >6 months. LDL is at goal; triglycerides are elevated. He does report some motivation to lose weight to 185#; have discussed strategies for weight loss including the plate method, calorie density, etc. He has maintained his weight since starting with our program. Patient will benefit from participation in pulmonary rehab for nutrition education, exercise, and lifestyle modification.      Intervention Plan   Intervention Prescribe, educate and counsel regarding individualized specific dietary modifications aiming towards targeted core components such  as weight, hypertension, lipid management, diabetes, heart failure and other comorbidities.;Nutrition handout(s) given to patient.    Expected Outcomes Short Term Goal: Understand basic principles of dietary content, such as calories, fat, sodium, cholesterol and nutrients.;Long Term Goal: Adherence to prescribed nutrition plan.          Nutrition Assessments:  Nutrition Assessments - 09/20/23 1123       Rate Your Plate Scores   Pre Score 34         MEDIFICTS Score Key: >=70 Need to make dietary changes  40-70 Heart Healthy Diet <= 40 Therapeutic Level Cholesterol Diet  Flowsheet Row PULMONARY REHAB OTHER RESPIRATORY from 09/20/2023 in [facility]  Picture Your Plate Total Score on Admission 34   Picture Your Plate Scores: <59 Unhealthy dietary pattern with much room for improvement. 41-50 Dietary pattern unlikely to meet recommendations for good health and room for improvement. 51-60 More healthful dietary pattern, with some room for improvement.  >60 Healthy dietary pattern, although there may be some specific behaviors that could be improved.    Nutrition Goals Re-Evaluation:  Nutrition Goals Re-Evaluation     Row Name 09/22/23 1042  10/20/23 1459           Goals   Current Weight 205 lb 7.5 oz (93.2 kg)  weight at orientation. 205 lb 14.6 oz (93.4 kg)      Comment triglyceride 366, LDL 73, HDL 26; A1c 5.7 triglyceride 366, LDL 73, HDL 26; A1c 5.7      Expected Outcome Patient has medical history of coronary artery disease s/p PTCA and stenting of LAD and RCA, atrial flutter status post ablation done in 2017, essential hypertension, dyslipidemia, COPD, IPF. He continues esbriet  for treatment of IPF; he did have initial unintentional weight loss when started on medication in 2022 but weight has been stable >6 months. LDL is at goal; triglycerides are elevated. He does report some motivation to lose weight to 185#; will continue to discuss strategies for weight loss including the plate method, calorie density, etc. Patient will benefit from participation in pulmonary rehab for nutrition education, exercise, and lifestyle modification. Goals in progress. Patient has medical history of coronary artery disease s/p PTCA and stenting of LAD and RCA, atrial flutter status post ablation done in 2017, essential hypertension, dyslipidemia, COPD, IPF. He continues esbriet  for treatment of IPF; he did have initial unintentional weight loss when started on medication in 2022 but weight has been stable >6 months. LDL is at goal; triglycerides are elevated. He does report some motivation to lose weight to 185#; have discussed strategies for weight loss including the plate method, calorie density, etc. He has maintained his weight since starting with our program. Patient will benefit from participation in pulmonary rehab for nutrition education, exercise, and lifestyle modification.         Nutrition Goals Discharge (Final Nutrition Goals Re-Evaluation):  Nutrition Goals Re-Evaluation - 10/20/23 1459       Goals   Current Weight 205 lb 14.6 oz (93.4 kg)    Comment triglyceride 366, LDL 73, HDL 26; A1c 5.7    Expected Outcome Goals in progress.  Patient has medical history of coronary artery disease s/p PTCA and stenting of LAD and RCA, atrial flutter status post ablation done in 2017, essential hypertension, dyslipidemia, COPD, IPF. He continues esbriet  for treatment of IPF; he did have initial unintentional weight loss when started on medication in 2022 but weight has been  stable >6 months. LDL is at goal; triglycerides are elevated. He does report some motivation to lose weight to 185#; have discussed strategies for weight loss including the plate method, calorie density, etc. He has maintained his weight since starting with our program. Patient will benefit from participation in pulmonary rehab for nutrition education, exercise, and lifestyle modification.          Psychosocial: Target Goals: Acknowledge presence or absence of significant depression and/or stress, maximize coping skills, provide positive support system. Participant is able to verbalize types and ability to use techniques and skills needed for reducing stress and depression.  Initial Review & Psychosocial Screening:  Initial Psych Review & Screening - 09/07/23 1059       Initial Review   Current issues with History of Depression;Current Stress Concerns;Current Psychotropic Meds    Source of Stress Concerns Chronic Illness;Unable to participate in former interests or hobbies;Unable to perform yard/household activities    Comments Octaviano states he worries about his declining health most days. He is unable to manage things around the house and has to turn the chores over to his wife. Also, he is not able to participate in former hobbies like cooking anymore.      Family Dynamics   Good Support System? Yes    Comments He has great support from his wife, children and friends      Barriers   Psychosocial barriers to participate in program The patient should benefit from training in stress management and relaxation.      Screening Interventions   Interventions  Encouraged to exercise;To provide support and resources with identified psychosocial needs    Expected Outcomes Short Term goal: Utilizing psychosocial counselor, staff and physician to assist with identification of specific Stressors or current issues interfering with healing process. Setting desired goal for each stressor or current issue identified.;Long Term Goal: Stressors or current issues are controlled or eliminated.;Short Term goal: Identification and review with participant of any Quality of Life or Depression concerns found by scoring the questionnaire.;Long Term goal: The participant improves quality of Life and PHQ9 Scores as seen by post scores and/or verbalization of changes          Quality of Life Scores:  Scores of 19 and below usually indicate a poorer quality of life in these areas.  A difference of  2-3 points is a clinically meaningful difference.  A difference of 2-3 points in the total score of the Quality of Life Index has been associated with significant improvement in overall quality of life, self-image, physical symptoms, and general health in studies assessing change in quality of life.  PHQ-9: Review Flowsheet  More data exists      11/10/2023 09/07/2023 12/30/2022 07/09/2022 12/22/2021  Depression screen PHQ 2/9  Decreased Interest 3 0 0 0 0  Down, Depressed, Hopeless 0 1 1 0 0  PHQ - 2 Score 3 1 1  0 0  Altered sleeping 0 0 0 - -  Tired, decreased energy 1 0 1 - -  Change in appetite 1 0 0 - -  Feeling bad or failure about yourself  0 1 0 - -  Trouble concentrating 0 1 0 - -  Moving slowly or fidgety/restless 0 1 0 - -  Suicidal thoughts 0 0 0 - -  PHQ-9 Score 5 4 2  - -  Difficult doing work/chores Somewhat difficult Somewhat difficult Not difficult at all - -   Interpretation of Total Score  Total Score Depression Severity:  1-4 =  Minimal depression, 5-9 = Mild depression, 10-14 = Moderate depression, 15-19 = Moderately severe depression, 20-27 = Severe  depression   Psychosocial Evaluation and Intervention:  Psychosocial Evaluation - 09/07/23 1142       Psychosocial Evaluation & Interventions   Interventions Stress management education;Relaxation education;Encouraged to exercise with the program and follow exercise prescription    Comments Laquinton states he worries about his declining health most days. He is unable to manage things around the house and has to turn the chores over to his wife. Also, he is not able to participate in former hobbies like cooking anymore. He is currently working with a mental health therapist and takes psychotropic meds. Staff will provide education and resources on ways to reduce stress.    Expected Outcomes For pt to participate in PR with less stress    Continue Psychosocial Services  No Follow up required          Psychosocial Re-Evaluation:  Psychosocial Re-Evaluation     Row Name 09/19/23 0854 10/19/23 0941 11/16/23 0930         Psychosocial Re-Evaluation   Current issues with History of Depression;Current Psychotropic Meds;Current Stress Concerns History of Depression;Current Psychotropic Meds;Current Stress Concerns History of Depression;Current Psychotropic Meds     Comments Manolo has attended 2 sessions so far. He is enjoying class so far. He denies any new psychosocial barriers or concerns at this time. [place] is doing well in class. He has voiced that he feels sad and discouraged most days. He struggles with the fact that he needs to wear his oxygen  while in the shower and states I'm just not going to do it. Staff encourages [place] in class and have suggested that he reach out to his mental health therapist. [place] continues to do well in class. He just celebrated his 53rd wedding anniversary and was happy about the huge milestone. He denies any new psychosocial barriers or concerns. He is compliant with taking his psychotropic meds.     Expected Outcomes For Milas to participate in PR with less stress  and any new psychosocial barriers or concerns. For Ladd to participate in PR with less stress and any new psychosocial barriers or concerns. For Tommey to participate in PR with less stress and any new psychosocial barriers or concerns.     Interventions Encouraged to attend Pulmonary Rehabilitation for the exercise Encouraged to attend Pulmonary Rehabilitation for the exercise Encouraged to attend Pulmonary Rehabilitation for the exercise     Continue Psychosocial Services  No Follow up required Follow up required by staff Follow up required by staff        Psychosocial Discharge (Final Psychosocial Re-Evaluation):  Psychosocial Re-Evaluation - 11/16/23 0930       Psychosocial Re-Evaluation   Current issues with History of Depression;Current Psychotropic Meds    Comments [place] continues to do well in class. He just celebrated his 53rd wedding anniversary and was happy about the huge milestone. He denies any new psychosocial barriers or concerns. He is compliant with taking his psychotropic meds.    Expected Outcomes For Michel to participate in PR with less stress and any new psychosocial barriers or concerns.    Interventions Encouraged to attend Pulmonary Rehabilitation for the exercise    Continue Psychosocial Services  Follow up required by staff          Education: Education Goals: Education classes will be provided on a weekly basis, covering required topics. Participant will state understanding/return demonstration of topics presented.  Learning  Barriers/Preferences:  Learning Barriers/Preferences - 09/07/23 1101       Learning Barriers/Preferences   Learning Barriers Sight;Hearing    Learning Preferences Group Instruction;Individual Instruction;Written Material;Skilled Demonstration          Education Topics: Know Your Numbers Group instruction that is supported by a PowerPoint presentation. Instructor discusses importance of knowing and understanding resting,  exercise, and post-exercise oxygen  saturation, heart rate, and blood pressure. Oxygen  saturation, heart rate, blood pressure, rating of perceived exertion, and dyspnea are reviewed along with a normal range for these values.  Flowsheet Row PULMONARY REHAB OTHER RESPIRATORY from 09/15/2023 in [facility], [facility]  Date 09/15/23  Educator EP  Instruction Review Code 1- Verbalizes Understanding    Exercise for the Pulmonary Patient Group instruction that is supported by a PowerPoint presentation. Instructor discusses benefits of exercise, core components of exercise, frequency, duration, and intensity of an exercise routine, importance of utilizing pulse oximetry during exercise, safety while exercising, and options of places to exercise outside of rehab.    MET Level  Group instruction provided by PowerPoint, verbal discussion, and written material to support subject matter. Instructor reviews what METs are and how to increase METs.  Flowsheet Row PULMONARY REHAB OTHER RESPIRATORY from 11/03/2023 in [facility] for Heart, Vascular, & Lung Health  Date 11/03/23  Educator EP  Instruction Review Code 1- Verbalizes Understanding    Pulmonary Medications Verbally interactive group education provided by instructor with focus on inhaled medications and proper administration.   Anatomy and Physiology of the Respiratory System Group instruction provided by PowerPoint, verbal discussion, and written material to support subject matter. Instructor reviews respiratory cycle and anatomical components of the respiratory system and their functions. Instructor also reviews differences in obstructive and restrictive respiratory diseases with examples of each.  Flowsheet Row PULMONARY REHAB OTHER RESPIRATORY from 11/17/2023 in [facility] for Heart, Vascular, & Lung Health  Date 11/17/23  Educator RT  Instruction  Review Code 1- Verbalizes Understanding    Oxygen  Safety Group instruction provided by PowerPoint, verbal discussion, and written material to support subject matter. There is an overview of "What is Oxygen " and "Why do we need it".  Instructor also reviews how to create a safe environment for oxygen  use, the importance of using oxygen  as prescribed, and the risks of noncompliance. There is a brief discussion on traveling with oxygen  and resources the patient may utilize. Flowsheet Row PULMONARY REHAB OTHER RESPIRATORY from 09/22/2023 in [facility] for [facility], Vascular, & Lung Health  Date 09/22/23  Educator RN  Instruction Review Code 1- Verbalizes Understanding    Oxygen  Use Group instruction provided by PowerPoint, verbal discussion, and written material to discuss how supplemental oxygen  is prescribed and different types of oxygen  supply systems. Resources for more information are provided.    Breathing Techniques Group instruction that is supported by demonstration and informational handouts. Instructor discusses the benefits of pursed lip and diaphragmatic breathing and detailed demonstration on how to perform both.  Flowsheet Row PULMONARY REHAB OTHER RESPIRATORY from 10/06/2023 in [facility] for Heart, Vascular, & Lung Health  Date 10/06/23  Educator RN  Instruction Review Code 1- Verbalizes Understanding     Risk Factor Reduction Group instruction that is supported by a PowerPoint presentation. Instructor discusses the definition of a risk factor, different risk factors for pulmonary disease, and how the heart and lungs work together. Flowsheet Row PULMONARY  REHAB OTHER RESPIRATORY from 10/27/2023 in [facility] for Heart, Vascular, & Lung Health  Date 10/27/23  Educator EP  Instruction Review Code 1- Verbalizes Understanding    Pulmonary Diseases Group instruction provided by PowerPoint, verbal discussion,  and written material to support subject matter. Instructor gives an overview of the different type of pulmonary diseases. There is also a discussion on risk factors and symptoms as well as ways to manage the diseases.   Stress and Energy Conservation Group instruction provided by PowerPoint, verbal discussion, and written material to support subject matter. Instructor gives an overview of stress and the impact it can have on the body. Instructor also reviews ways to reduce stress. There is also a discussion on energy conservation and ways to conserve energy throughout the day.   Warning Signs and Symptoms Group instruction provided by PowerPoint, verbal discussion, and written material to support subject matter. Instructor reviews warning signs and symptoms of stroke, heart attack, cold and flu. Instructor also reviews ways to prevent the spread of infection. Flowsheet Row PULMONARY REHAB OTHER RESPIRATORY from 10/20/2023 in [facility] for Heart, Vascular, & Lung Health  Date 10/20/23  Educator RN  Instruction Review Code 1- Verbalizes Understanding    Other Education Group or individual verbal, written, or video instructions that support the educational goals of the pulmonary rehab program.    Knowledge Questionnaire Score:  Knowledge Questionnaire Score - 11/10/23 0928       Knowledge Questionnaire Score   Post Score 16/18          Core Components/Risk Factors/Patient Goals at Admission:  Personal Goals and Risk Factors at Admission - 09/07/23 1102       Core Components/Risk Factors/Patient Goals on Admission    Weight Management Weight Loss;Yes    Intervention Weight Management: Develop a combined nutrition and exercise program designed to reach desired caloric intake, while maintaining appropriate intake of nutrient and fiber, sodium and fats, and appropriate energy expenditure required for the weight goal.;Weight Management: Provide education and  appropriate resources to help participant work on and attain dietary goals.;Weight Management/Obesity: Establish reasonable short term and long term weight goals.;Obesity: Provide education and appropriate resources to help participant work on and attain dietary goals.    Admit Weight 205 lb 7.5 oz (93.2 kg)    Expected Outcomes Short Term: Continue to assess and modify interventions until short term weight is achieved;Long Term: Adherence to nutrition and physical activity/exercise program aimed toward attainment of established weight goal;Weight Loss: Understanding of general recommendations for a balanced deficit meal plan, which promotes 1-2 lb weight loss per week and includes a negative energy balance of [phone] kcal/d;Understanding recommendations for meals to include 15-35% energy as protein, 25-35% energy from fat, 35-60% energy from carbohydrates, less than 200mg  of dietary cholesterol, 20-35 gm of total fiber daily;Understanding of distribution of calorie intake throughout the day with the consumption of 4-5 meals/snacks    Improve shortness of breath with ADL's Yes    Intervention Provide education, individualized exercise plan and daily activity instruction to help decrease symptoms of SOB with activities of daily living.    Expected Outcomes Short Term: Improve cardiorespiratory fitness to achieve a reduction of symptoms when performing ADLs;Long Term: Be able to perform more ADLs without symptoms or delay the onset of symptoms          Core Components/Risk Factors/Patient Goals Review:   Goals and Risk Factor Review     Row Name 09/19/23 [id number]  10/19/23 0944 11/16/23 0933         Core Components/Risk Factors/Patient Goals Review   Personal Goals Review Weight Management/Obesity;Improve shortness of breath with ADL's;Develop more efficient breathing techniques such as purse lipped breathing and diaphragmatic breathing and practicing self-pacing with activity. Weight  Management/Obesity;Improve shortness of breath with ADL's;Develop more efficient breathing techniques such as purse lipped breathing and diaphragmatic breathing and practicing self-pacing with activity. Weight Management/Obesity;Improve shortness of breath with ADL's     Review Monthly review of patient's Core Components/Risk Factors/Patient Goals are as follows: Goal progressing for improving shortness of breath. Ehan is currently using 2L O2 while exercising to keep sats >88%. He is currently exercising on the [place] for 30 minutes. Goal progressing for weight loss. Khallid is working with the staff dietitician on ways to meet his weight loss goals. Goal progressing for developing more efficient breathing techniques such as purse lipped breathing and diaphragmatic breathing; and practicing self-pacing with activity. We will continue to monitor Ahmani's progress throughout the program. Monthly review of patient's Core Components/Risk Factors/Patient Goals are as follows: Goal progressing for improving shortness of breath. Birt is currently using 2L O2 while exercising to keep sats >88%. He is currently exercising on the [place] and walking the track. Goal progressing for weight loss. Emery is working with the staff dietitician on ways to meet his weight loss goals. Goal progressing for developing more efficient breathing techniques such as purse lipped breathing and diaphragmatic breathing; and practicing self-pacing with activity. We will continue to monitor Jonael's progress throughout the program. Monthly review of patient's Core Components/Risk Factors/Patient Goals are as follows: Goal progressing for improving shortness of breath. Orlie is currently using 2L O2 while exercising to keep sats >88%. He is currently exercising on the [place] and walking the track. Goal progressing for weight loss. Darsh is working with the staff dietitian on ways to meet his weight loss goals. Goal met for developing  more efficient breathing techniques such as purse lipped breathing and diaphragmatic breathing; and practicing self-pacing with activity. [place] is able to demonstrate PLB when he gets short of breath. He also knows how to self pace based on his RPE/dyspnea scores. We will continue to monitor Muhamad's progress throughout the program.     Expected Outcomes To improve shortness of breath with ADL's, develop more efficient breathing techniques such as purse lipped breathing and diaphragmatic breathing; and practicing self-pacing with activity and lose weight. To improve shortness of breath with ADL's, develop more efficient breathing techniques such as purse lipped breathing and diaphragmatic breathing; and practicing self-pacing with activity and lose weight. To improve shortness of breath with ADL's, and lose weight.        Core Components/Risk Factors/Patient Goals at Discharge (Final Review):   Goals and Risk Factor Review - 11/16/23 0933       Core Components/Risk Factors/Patient Goals Review   Personal Goals Review Weight Management/Obesity;Improve shortness of breath with ADL's    Review Monthly review of patient's Core Components/Risk Factors/Patient Goals are as follows: Goal progressing for improving shortness of breath. Areeb is currently using 2L O2 while exercising to keep sats >88%. He is currently exercising on the [place] and walking the track. Goal progressing for weight loss. Harrington is working with the staff dietitian on ways to meet his weight loss goals. Goal met for developing more efficient breathing techniques such as purse lipped breathing and diaphragmatic breathing; and practicing self-pacing with activity. [place] is able to demonstrate PLB when he gets short of  breath. He also knows how to self pace based on his RPE/dyspnea scores. We will continue to monitor Sael's progress throughout the program.    Expected Outcomes To improve shortness of breath with ADL's, and lose weight.           ITP Comments: Pt is making expected progress toward Pulmonary Rehab goals after completing 19 session(s). Recommend continued exercise, life style modification, education, and utilization of breathing techniques to increase stamina and strength, while also decreasing shortness of breath with exertion.  Dr. Slater Staff is Medical Director for Pulmonary Rehab at [facility].

## 2023-11-24 ENCOUNTER — Encounter (HOSPITAL_COMMUNITY)
Admission: RE | Admit: 2023-11-24 | Discharge: 2023-11-24 | Disposition: A | Discharge status: Home / Self Care | Source: Ambulatory Visit | Attending: Internal Medicine

## 2023-11-24 DIAGNOSIS — J84112 Idiopathic pulmonary fibrosis: Secondary | ICD-10-CM | POA: Diagnosis not present

## 2023-11-24 NOTE — Progress Notes (Signed)
 Daily Session Note  Patient Details  Name: Robert Lang MRN: [id number] Date of Birth: [birth year] Referring Provider:   Conrad Ports Pulmonary Rehab Walk Test from 09/07/2023 in [facility]  Referring Provider Ramaswamy    Encounter Date: 11/24/2023  Check In:  Session Check In - 11/24/23 1024       Check-In   Supervising physician immediately available to respond to emergencies [facility] MD immediately available    Physician(s) Barnie Hila, NP    Location [facility]    Staff Present Johnnie Moats, MS, ACSM-CEP, Exercise Physiologist;Casey Claudene Candia Levin, RN, BSN;Joani Cosma BS, ACSM-CEP, Exercise Physiologist;Jetta Walker BS, ACSM-CEP, Exercise Physiologist    Virtual Visit No    Medication changes reported     No    Fall or balance concerns reported    No    Tobacco Cessation No Change    Warm-up and Cool-down Performed as group-led instruction    Resistance Training Performed Yes    VAD Patient? No    PAD/SET Patient? No      Pain Assessment   Currently in Pain? No/denies    Multiple Pain Sites No          Capillary Blood Glucose: No results found for this or any previous visit (from the past 24 hours).    Social History   Tobacco Use  Smoking Status Former   Current packs/day: 0.00   Average packs/day: 1 pack/day for 30.0 years (30.0 ttl pk-yrs)   Types: Cigarettes   Start date: 01/09/1989   Quit date: 01/10/2019   Years since quitting: 4.8  Smokeless Tobacco Former    Goals Met:  Exercise tolerated well No report of concerns or symptoms today Strength training completed today  Goals Unmet:  Not Applicable  Comments: Service time is from 1007 to 1140.    Dr. Slater Staff is Medical Director for Pulmonary Rehab at [facility].

## 2023-11-29 ENCOUNTER — Encounter (HOSPITAL_COMMUNITY)
Admission: RE | Admit: 2023-11-29 | Discharge: 2023-11-29 | Disposition: A | Discharge status: Home / Self Care | Source: Ambulatory Visit | Attending: Internal Medicine | Admitting: Internal Medicine

## 2023-11-29 DIAGNOSIS — J84112 Idiopathic pulmonary fibrosis: Secondary | ICD-10-CM | POA: Diagnosis not present

## 2023-11-29 NOTE — Progress Notes (Signed)
 Daily Session Note  Patient Details  Name: Robert Lang MRN: [id number] Date of Birth: 04-Oct-1950 Referring Provider:   Conrad Ports Pulmonary Rehab Walk Test from 09/07/2023 in [facility] for [facility], V[facility], & Lung [facility]  Referring Provider Ramaswamy    Encounter Date: 11/29/2023  Check In:  Session Check In - 11/29/23 1125       Check-In   Supervising physician immediately available to respond to emergencies [facility] MD immediately available    Physician(s) Josefa Beauvais, NP    [facility]    Staff Present Johnnie Moats, MS, ACSM-CEP, Exercise Physiologist;Clydene Burack Claudene Candia Levin, RN, BSN;Randi Reeve BS, ACSM-CEP, Exercise Physiologist    Virtual Visit No    Medication changes reported     No    Fall or balance concerns reported    No    Tobacco Cessation No Change    Warm-up and Cool-down Performed as group-led instruction    Resistance Training Performed Yes    VAD Patient? No    PAD/SET Patient? No      Pain Assessment   Currently in Pain? No/denies    Multiple Pain Sites No          Capillary Blood Glucose: No results found for this or any previous visit (from the past 24 hours).    Social History   Tobacco Use  Smoking Status Former   Current packs/day: 0.00   Average packs/day: 1 pack/day for 30.0 years (30.0 ttl pk-yrs)   Types: Cigarettes   Start date: 01/09/1989   Quit date: 01/10/2019   Years since quitting: 4.8  Smokeless Tobacco Former    Goals Met:  Proper associated with RPD/PD & O2 Sat Independence with exercise equipment Exercise tolerated well No report of concerns or symptoms today Strength training completed today  Goals Unmet:  Not Applicable  Comments: Service time is from 1008 to 1145.    Dr. Slater Staff is Medical Director for Pulmonary Rehab at [facility].

## 2023-12-01 ENCOUNTER — Encounter (HOSPITAL_COMMUNITY)
Admission: RE | Admit: 2023-12-01 | Discharge: 2023-12-01 | Disposition: A | Discharge status: Home / Self Care | Source: Ambulatory Visit | Attending: Internal Medicine

## 2023-12-01 DIAGNOSIS — J84112 Idiopathic pulmonary fibrosis: Secondary | ICD-10-CM | POA: Diagnosis not present

## 2023-12-01 NOTE — Progress Notes (Signed)
 Daily Session Note  Patient Details  Name: Robert Lang MRN: [id number] Date of Birth: 07-23-1950 Referring Provider:   Conrad Ports Pulmonary Rehab Walk Test from 09/07/2023 in [facility]  Referring Provider Ramaswamy    Encounter Date: 12/01/2023  Check In:  Session Check In - 12/01/23 1039       Check-In   Supervising physician immediately available to respond to emergencies [facility] MD immediately available    Physician(s) Orren Fabry, PA    [facility]    Staff Present Johnnie Moats, MS, ACSM-CEP, Exercise Physiologist;Casey Claudene Candia Levin, RN, BSN;Randi Reeve BS, ACSM-CEP, Exercise Physiologist    Virtual Visit No    Medication changes reported     No    Fall or balance concerns reported    No    Tobacco Cessation No Change    Warm-up and Cool-down Performed as group-led instruction    Resistance Training Performed Yes    VAD Patient? No    PAD/SET Patient? No      Pain Assessment   Currently in Pain? No/denies          Capillary Blood Glucose: No results found for this or any previous visit (from the past 24 hours).    Social History   Tobacco Use  Smoking Status Former   Current packs/day: 0.00   Average packs/day: 1 pack/day for 30.0 years (30.0 ttl pk-yrs)   Types: Cigarettes   Start date: 01/09/1989   Quit date: 01/10/2019   Years since quitting: 4.8  Smokeless Tobacco Former    Goals Met:  Independence with exercise equipment Exercise tolerated well No report of concerns or symptoms today Strength training completed today  Goals Unmet:  Not Applicable  Comments: Service time is from 1007 to 1147    Dr. Slater Staff is Medical Director for Pulmonary Rehab at [facility].

## 2023-12-02 NOTE — Progress Notes (Signed)
 Discharge Progress Report  Patient Details  Name: Robert Lang MRN: [id number] Date of Birth: January 02, 1951 Referring Provider:   [facility] Walk Test from 09/07/2023 in [facility]  Referring Provider Ramaswamy     Number of Visits: 22  Reason for Discharge:  Patient has met program and personal goals.  Smoking History:  Social History   Tobacco Use  Smoking Status Former   Current packs/day: 0.00   Average packs/day: 1 pack/day for 30.0 years (30.0 ttl pk-yrs)   Types: Cigarettes   Start date: 01/09/1989   Quit date: 01/10/2019   Years since quitting: 4.8  Smokeless Tobacco Former    Diagnosis:  IPF (idiopathic pulmonary fibrosis) (HCC)  ADL UCSD:  Pulmonary Assessment Scores     Row Name 09/07/23 1135 11/10/23 0928       ADL UCSD   ADL Phase Entry Exit    SOB Score total 82 58      CAT Score   CAT Score 22 19      mMRC Score   mMRC Score 4 --       Initial Exercise Prescription:  Initial Exercise Prescription - 09/07/23 1200       Date of Initial Exercise RX and Referring Provider   Date 09/07/23    Referring Provider Ramaswamy    Expected Discharge Date 12/01/23      Oxygen    Oxygen  Continuous    Liters 2    Maintain Oxygen  Saturation 88% or higher      NuStep   Level 1    SPM 50    Minutes 30    METs 1.5      Prescription Details   Frequency (times per week) 2    Duration Progress to 30 minutes of continuous aerobic without signs/symptoms of physical distress      Intensity   THRR 40-80% of Max Heartrate 59-118    Ratings of Perceived Exertion 11-13    Perceived Dyspnea 0-4      Progression   Progression Continue to progress workloads to maintain intensity without signs/symptoms of physical distress.      Resistance Training   Training Prescription Yes    Weight red bands    Reps 10-15          Discharge Exercise Prescription (Final Exercise  Prescription Changes):  Exercise Prescription Changes - 11/22/23 1100       Response to Exercise   Blood Pressure (Admit) 126/64    Blood Pressure (Exercise) 134/60    Blood Pressure (Exit) 114/60    Heart Rate (Admit) 83 bpm    Heart Rate (Exercise) 108 bpm    Heart Rate (Exit) 100 bpm    Oxygen  Saturation (Admit) 95 %    Oxygen  Saturation (Exercise) 94 %    Oxygen  Saturation (Exit) 95 %    Rating of Perceived Exertion (Exercise) 14    Perceived Dyspnea (Exercise) 2    Duration Continue with 30 min of aerobic exercise without signs/symptoms of physical distress.    Intensity THRR unchanged      Progression   Progression Continue to progress workloads to maintain intensity without signs/symptoms of physical distress.      Resistance Training   Training Prescription Yes    Weight red bands    Reps 10-15    Time 10 Minutes      Oxygen    Oxygen  Continuous    Liters 2  NuStep   Level 3    SPM 99    Minutes 15    METs 2.7      Track   Laps 9    Minutes 15    METs 2.38      Oxygen    Maintain Oxygen  Saturation 88% or higher          Functional Capacity:  6 Minute Walk     Row Name 09/07/23 1203 12/01/23 1205       6 Minute Walk   Phase Initial Discharge    Distance 645 feet 1335 feet    Distance % Change -- 106.98 %    Distance Feet Change -- 690 ft    Walk Time 6 minutes 6 minutes    # of Rest Breaks 2  1:41-2:00, 4:11-4:40 0    MPH 1.22 2.53    METS 1.53 3.05    RPE 13 13    Perceived Dyspnea  1 2    VO2 Peak 5.35 10.67    Symptoms No No    Resting HR 89 bpm 96 bpm    Resting BP 98/60 112/64    Resting Oxygen  Saturation  96 % 94 %    Exercise Oxygen  Saturation  during 6 min walk 93 % 92 %    Max Ex. HR 101 bpm 129 bpm    Max Ex. BP 122/62 130/66    2 Minute Post BP 118/64 124/70      Interval HR   1 Minute HR 101 99    2 Minute HR 86 114    3 Minute HR 88 113    4 Minute HR 94 121    5 Minute HR 91 129    6 Minute HR 94 119    2  Minute Post HR 79 106    Interval Heart Rate? Yes Yes      Interval Oxygen    Interval Oxygen ? Yes Yes    Baseline Oxygen  Saturation % 96 % 94 %    1 Minute Oxygen  Saturation % 93 % 94 %    1 Minute Liters of Oxygen  2 L 2 L    2 Minute Oxygen  Saturation % 95 % 92 %    2 Minute Liters of Oxygen  2 L 2 L    3 Minute Oxygen  Saturation % 94 % 92 %    3 Minute Liters of Oxygen  2 L 2 L    4 Minute Oxygen  Saturation % 94 % 93 %    4 Minute Liters of Oxygen  2 L 2 L    5 Minute Oxygen  Saturation % 94 % 93 %    5 Minute Liters of Oxygen  2 L 2 L    6 Minute Oxygen  Saturation % 94 % 92 %    6 Minute Liters of Oxygen  2 L 2 L    2 Minute Post Oxygen  Saturation % 96 % 95 %    2 Minute Post Liters of Oxygen  2 L 2 L       Psychological, QOL, Others - Outcomes: PHQ 2/9:    11/10/2023    9:28 AM 09/07/2023   10:29 AM 12/30/2022    1:34 PM 07/09/2022    1:18 PM 12/22/2021    1:42 PM  Depression screen PHQ 2/9  Decreased Interest 3 0 0 0 0  Down, Depressed, Hopeless 0 1 1 0 0  PHQ - 2 Score 3 1 1  0 0  Altered sleeping 0 0 0  Tired, decreased energy 1 0 1    Change in appetite 1 0 0    Feeling bad or failure about yourself  0 1 0    Trouble concentrating 0 1 0    Moving slowly or fidgety/restless 0 1 0    Suicidal thoughts 0 0 0    PHQ-9 Score 5 4 2     Difficult doing work/chores Somewhat difficult Somewhat difficult Not difficult at all      Quality of Life:   Personal Goals: Goals established at orientation with interventions provided to work toward goal.  Personal Goals and Risk Factors at Admission - 09/07/23 1102       Core Components/Risk Factors/Patient Goals on Admission    Weight Management Weight Loss;Yes    Intervention Weight Management: Develop a combined nutrition and exercise program designed to reach desired caloric intake, while maintaining appropriate intake of nutrient and fiber, sodium and fats, and appropriate energy expenditure required for the weight goal.;Weight  Management: Provide education and appropriate resources to help participant work on and attain dietary goals.;Weight Management/Obesity: Establish reasonable short term and long term weight goals.;Obesity: Provide education and appropriate resources to help participant work on and attain dietary goals.    Admit Weight 205 lb 7.5 oz (93.2 kg)    Expected Outcomes Short Term: Continue to assess and modify interventions until short term weight is achieved;Long Term: Adherence to nutrition and physical activity/exercise program aimed toward attainment of established weight goal;Weight Loss: Understanding of general recommendations for a balanced deficit meal plan, which promotes 1-2 lb weight loss per week and includes a negative energy balance of [phone] kcal/d;Understanding recommendations for meals to include 15-35% energy as protein, 25-35% energy from fat, 35-60% energy from carbohydrates, less than 200mg  of dietary cholesterol, 20-35 gm of total fiber daily;Understanding of distribution of calorie intake throughout the day with the consumption of 4-5 meals/snacks    Improve shortness of breath with ADL's Yes    Intervention Provide education, individualized exercise plan and daily activity instruction to help decrease symptoms of SOB with activities of daily living.    Expected Outcomes Short Term: Improve cardiorespiratory fitness to achieve a reduction of symptoms when performing ADLs;Long Term: Be able to perform more ADLs without symptoms or delay the onset of symptoms           Personal Goals Discharge:  Goals and Risk Factor Review     Row Name 09/19/23 0859 10/19/23 0944 11/16/23 0933 12/02/23 0819       Core Components/Risk Factors/Patient Goals Review   Personal Goals Review Weight Management/Obesity;Improve shortness of breath with ADL's;Develop more efficient breathing techniques such as purse lipped breathing and diaphragmatic breathing and practicing self-pacing with activity.  Weight Management/Obesity;Improve shortness of breath with ADL's;Develop more efficient breathing techniques such as purse lipped breathing and diaphragmatic breathing and practicing self-pacing with activity. Weight Management/Obesity;Improve shortness of breath with ADL's Weight Management/Obesity;Improve shortness of breath with ADL's    Review Monthly review of patient's Core Components/Risk Factors/Patient Goals are as follows: Goal progressing for improving shortness of breath. Keonte is currently using 2L O2 while exercising to keep sats >88%. He is currently exercising on the Nustep for 30 minutes. Goal progressing for weight loss. Rani is working with the staff dietitician on ways to meet his weight loss goals. Goal progressing for developing more efficient breathing techniques such as purse lipped breathing and diaphragmatic breathing; and practicing self-pacing with activity. We will continue to monitor Caven's progress throughout the program. Monthly  review of patient's Core Components/Risk Factors/Patient Goals are as follows: Goal progressing for improving shortness of breath. Misael is currently using 2L O2 while exercising to keep sats >88%. He is currently exercising on the Nustep and walking the track. Goal progressing for weight loss. Verdun is working with the staff dietitician on ways to meet his weight loss goals. Goal progressing for developing more efficient breathing techniques such as purse lipped breathing and diaphragmatic breathing; and practicing self-pacing with activity. We will continue to monitor Benjerman's progress throughout the program. Monthly review of patient's Core Components/Risk Factors/Patient Goals are as follows: Goal progressing for improving shortness of breath. Elwood is currently using 2L O2 while exercising to keep sats >88%. He is currently exercising on the Nustep and walking the track. Goal progressing for weight loss. Adeeb is working with the staff  dietitian on ways to meet his weight loss goals. Goal met for developing more efficient breathing techniques such as purse lipped breathing and diaphragmatic breathing; and practicing self-pacing with activity. [place] is able to demonstrate PLB when he gets short of breath. He also knows how to self pace based on his RPE/dyspnea scores. We will continue to monitor [place]mound's progress throughout the program. India graduated from the PR program on 12/01/23. [place] did not meet his goal for weight loss. He did meet his goal for improving shortness of breath with ADL's. India did great during the program and we wish him the best.    Expected Outcomes To improve shortness of breath with ADL's, develop more efficient breathing techniques such as purse lipped breathing and diaphragmatic breathing; and practicing self-pacing with activity and lose weight. To improve shortness of breath with ADL's, develop more efficient breathing techniques such as purse lipped breathing and diaphragmatic breathing; and practicing self-pacing with activity and lose weight. To improve shortness of breath with ADL's, and lose weight. To continue to exercise and modify his nutrition and lifestyle post graduation       Exercise Goals and Review:  Exercise Goals     Row Name 09/07/23 1106             Exercise Goals   Increase Physical Activity Yes       Intervention Provide advice, education, support and counseling about physical activity/exercise needs.;Develop an individualized exercise prescription for aerobic and resistive training based on initial evaluation findings, risk stratification, comorbidities and participant's personal goals.       Expected Outcomes Short Term: Attend rehab on a regular basis to increase amount of physical activity.;Long Term: Add in home exercise to make exercise part of routine and to increase amount of physical activity.;Long Term: Exercising regularly at least 3-5 days a week.       Increase Strength and  Stamina Yes       Intervention Provide advice, education, support and counseling about physical activity/exercise needs.;Develop an individualized exercise prescription for aerobic and resistive training based on initial evaluation findings, risk stratification, comorbidities and participant's personal goals.       Expected Outcomes Short Term: Increase workloads from initial exercise prescription for resistance, speed, and METs.;Short Term: Perform resistance training exercises routinely during rehab and add in resistance training at home;Long Term: Improve cardiorespiratory fitness, muscular endurance and strength as measured by increased METs and functional capacity ( )       Able to understand and use rate of perceived exertion (RPE) scale Yes       Intervention Provide education and explanation on how to use RPE scale  Expected Outcomes Short Term: Able to use RPE daily in rehab to express subjective intensity level;Long Term:  Able to use RPE to guide intensity level when exercising independently       Able to understand and use Dyspnea scale Yes       Intervention Provide education and explanation on how to use Dyspnea scale       Expected Outcomes Short Term: Able to use Dyspnea scale daily in rehab to express subjective sense of shortness of breath during exertion;Long Term: Able to use Dyspnea scale to guide intensity level when exercising independently       Knowledge and understanding of Target Heart Rate Range (THRR) Yes       Intervention Provide education and explanation of THRR including how the numbers were predicted and where they are located for reference       Expected Outcomes Short Term: Able to state/look up THRR;Long Term: Able to use THRR to govern intensity when exercising independently;Short Term: Able to use daily as guideline for intensity in rehab       Understanding of Exercise Prescription Yes       Intervention Provide education, explanation, and written  materials on patient's individual exercise prescription       Expected Outcomes Short Term: Able to explain program exercise prescription;Long Term: Able to explain home exercise prescription to exercise independently          Exercise Goals Re-Evaluation:  Exercise Goals Re-Evaluation     Row Name 09/22/23 0743 10/19/23 1102 11/16/23 0742 12/01/23 1532       Exercise Goal Re-Evaluation   Exercise Goals Review Increase Physical Activity;Able to understand and use Dyspnea scale;Understanding of Exercise Prescription;Increase Strength and Stamina;Knowledge and understanding of Target Heart Rate Range (THRR);Able to understand and use rate of perceived exertion (RPE) scale Increase Physical Activity;Able to understand and use Dyspnea scale;Understanding of Exercise Prescription;Increase Strength and Stamina;Knowledge and understanding of Target Heart Rate Range (THRR);Able to understand and use rate of perceived exertion (RPE) scale Increase Physical Activity;Able to understand and use Dyspnea scale;Understanding of Exercise Prescription;Increase Strength and Stamina;Knowledge and understanding of Target Heart Rate Range (THRR);Able to understand and use rate of perceived exertion (RPE) scale Increase Physical Activity;Able to understand and use Dyspnea scale;Understanding of Exercise Prescription;Increase Strength and Stamina;Knowledge and understanding of Target Heart Rate Range (THRR);Able to understand and use rate of perceived exertion (RPE) scale    Comments Ostin has completed 3 exercise sessions. He exercises for 30 min on the Nustep. Azarion averages 2 METs at level 1 on the Nustep. He performs the warmup and cooldown standing with rest breaks as needed. It is too soon to notate any discernable progressions. Will continue to monitor and progress as able. Jatniel has completed 9 exercise sessions. He exercises for 15 min on the track and Nustep. Ken averages 2.23 METs on the track and 2.6 METs at  level 2 on the Nustep. He performs the warmup and cooldown standing with rest breaks as needed. [place] has progressed to walking the track. He tolerates walking well and takes rest breaks as needed. He has also increased his level on the Nustep. [place] seems motivated to exercise. Will continue to monitor and progress as able. Yeudiel has completed 17 exercise sessions. He exercises for 15 min on the track and Nustep. Ken averages 2.23 METs on the track and 2.8 METs at level 3 on the Nustep. He performs the warmup and cooldown standing with rest breaks as needed. [place] has increased  his level on the Nustep. His track laps remain relatively the same. [place] is possibly reaching his maximum exercise capacity. He is very limited by his chronic back pain. Will continue to monitor and progress as able. Quinlin has completed 22 exericse sessions. Peak METs were 2.69 on the track and 2.8 on the Nustep. Ken plans to continue exercise at home.    Expected Outcomes Through exercise at rehab and home, the patient will decrease shortness of breath with daily activities and feel confident in carrying out an exercise regimen at home. Through exercise at rehab and home, the patient will decrease shortness of breath with daily activities and feel confident in carrying out an exercise regimen at home. Through exercise at rehab and home, the patient will decrease shortness of breath with daily activities and feel confident in carrying out an exercise regimen at home. Through exercise at rehab and home, the patient will decrease shortness of breath with daily activities and feel confident in carrying out an exercise regimen at home.       Nutrition & Weight - Outcomes:    Nutrition:  Nutrition Therapy & Goals - 10/20/23 1459       Nutrition Therapy   Diet General Healthy Diet    Drug/Food Interactions Statins/Certain Fruits      Personal Nutrition Goals   Nutrition Goal Patient to improve diet quality by using the plate method  as a guide for meal planning to include lean protein/plant protein, fruits, vegetables, whole grains, nonfat dairy as part of a well-balanced diet.   goal in progress.   Personal Goal #2 Patient to identify strategies for weight loss of 0.5-2.0# per week.   goal not met.   Comments Goals in progress. Patient has medical history of coronary artery disease s/p PTCA and stenting of LAD and RCA, atrial flutter status post ablation done in 2017, essential hypertension, dyslipidemia, COPD, IPF. He continues esbriet  for treatment of IPF; he did have initial unintentional weight loss when started on medication in 2022 but weight has been stable >6 months. LDL is at goal; triglycerides are elevated. He does report some motivation to lose weight to 185#; have discussed strategies for weight loss including the plate method, calorie density, etc. He has maintained his weight since starting with our program. Patient will benefit from participation in pulmonary rehab for nutrition education, exercise, and lifestyle modification.      Intervention Plan   Intervention Prescribe, educate and counsel regarding individualized specific dietary modifications aiming towards targeted core components such as weight, hypertension, lipid management, diabetes, heart failure and other comorbidities.;Nutrition handout(s) given to patient.    Expected Outcomes Short Term Goal: Understand basic principles of dietary content, such as calories, fat, sodium, cholesterol and nutrients.;Long Term Goal: Adherence to prescribed nutrition plan.          Nutrition Discharge:  Nutrition Assessments - 09/20/23 1123       Rate Your Plate Scores   Pre Score 34          Education Questionnaire Score:  Knowledge Questionnaire Score - 11/10/23 0928       Knowledge Questionnaire Score   Post Score 16/18          Goals reviewed with patient; copy given to patient.

## 2023-12-08 DIAGNOSIS — I10 Essential (primary) hypertension: Secondary | ICD-10-CM | POA: Diagnosis not present

## 2023-12-08 DIAGNOSIS — Z86711 Personal history of pulmonary embolism: Secondary | ICD-10-CM | POA: Diagnosis not present

## 2023-12-08 DIAGNOSIS — A419 Sepsis, unspecified organism: Secondary | ICD-10-CM | POA: Diagnosis not present

## 2023-12-08 DIAGNOSIS — I2693 Single subsegmental pulmonary embolism without acute cor pulmonale: Secondary | ICD-10-CM | POA: Diagnosis not present

## 2023-12-10 DIAGNOSIS — I10 Essential (primary) hypertension: Secondary | ICD-10-CM | POA: Diagnosis not present

## 2023-12-10 DIAGNOSIS — I2693 Single subsegmental pulmonary embolism without acute cor pulmonale: Secondary | ICD-10-CM | POA: Diagnosis not present

## 2023-12-10 DIAGNOSIS — A419 Sepsis, unspecified organism: Secondary | ICD-10-CM | POA: Diagnosis not present

## 2023-12-12 DIAGNOSIS — H43812 Vitreous degeneration, left eye: Secondary | ICD-10-CM | POA: Diagnosis not present

## 2023-12-12 DIAGNOSIS — H35033 Hypertensive retinopathy, bilateral: Secondary | ICD-10-CM | POA: Diagnosis not present

## 2023-12-12 DIAGNOSIS — H43821 Vitreomacular adhesion, right eye: Secondary | ICD-10-CM | POA: Diagnosis not present

## 2023-12-12 DIAGNOSIS — H31013 Macula scars of posterior pole (postinflammatory) (post-traumatic), bilateral: Secondary | ICD-10-CM | POA: Diagnosis not present

## 2023-12-14 DIAGNOSIS — Z0279 Encounter for issue of other medical certificate: Secondary | ICD-10-CM

## 2023-12-19 ENCOUNTER — Other Ambulatory Visit: Payer: Self-pay | Admitting: Neurology

## 2023-12-19 DIAGNOSIS — G4700 Insomnia, unspecified: Secondary | ICD-10-CM

## 2023-12-20 NOTE — Telephone Encounter (Signed)
 Requested Prescriptions   Pending Prescriptions Disp Refills   zolpidem  (AMBIEN ) 5 MG tablet [Pharmacy Med Name: ZOLPIDEM  5MG  TABLETS] 30 tablet     Sig: TAKE 1 TABLET(5 MG) BY MOUTH AT BEDTIME   Last seen 06/22/23 Next appt 01/11/24 Dispenses   Dispensed Days Supply Quantity Provider Pharmacy  ZOLPIDEM  5MG  TABLETS 11/22/2023 30 30 each Gregg Lek, MD [facility] DR[facility]ORE #...  ZOLPIDEM  5MG  TABLETS 10/22/2023 30 30 each Gregg Lek, MD [facility] DRUG STORE #...  ZOLPIDEM  5MG  TABLETS 09/21/2023 30 30 each Gregg Lek, MD [facility] DRUG STORE #...  ZOLPIDEM  5MG  TABLETS 08/24/2023 30 30 each Gregg Lek, MD [facility] DRUG STORE #...  ZOLPIDEM  5MG  TABLETS 07/21/2023 30 30 each Gregg Lek, MD [facility] DR[facility] STORE #...  ZOLPIDEM  5MG  TABLETS 06/22/2023 30 30 each Gregg Lek, MD [facility] DRUG STORE #...  ZOLPIDEM  5MG  TABLETS 05/24/2023 30 30 each Gregg Lek, MD [facility] DRUG STORE #...  ZOLPIDEM  5MG  TABLETS 04/23/2023 30 30 each Gregg Lek, MD [facility] DRUG STORE #...  ZOLPIDEM  5MG  TABLETS 03/25/2023 30 30 each Gregg Lek, MD [facility] DR[facility] STORE #...  ZOLPIDEM  5MG  TABLETS 02/21/2023 30 30 each Gregg Lek, MD [facility] DRUG STORE #...  ZOLPIDEM  5MG  TABLETS 01/19/2023 30 30 each Gregg Lek, MD [facility] DR[facility] STORE #.[place]

## 2023-12-24 ENCOUNTER — Other Ambulatory Visit: Payer: Self-pay | Admitting: Neurology

## 2023-12-26 NOTE — Telephone Encounter (Signed)
 Requested Prescriptions   Pending Prescriptions Disp Refills   pregabalin  (LYRICA ) 150 MG capsule [Pharmacy Med Name: PREGABALIN  150MG  CAPSULES] 60 capsule     Sig: TAKE 1 CAPSULE(150 MG) BY MOUTH TWICE DAILY   Last seen 06/22/23 Next appt 01/11/24 Dispenses   Dispensed Days Supply Quantity Provider Pharmacy  PREGABALIN  150MG  CAPSULES 11/28/2023 30 60 each Gregg Lek, MD [facility] DRUG STORE #...  PREGABALIN  150MG  CAPSULES 10/25/2023 30 60 each Gregg Lek, MD [facility] DRUG STORE #...  PREGABALIN  150MG  CAPSULES 09/12/2023 45 90 each Gregg Lek, MD [facility] DRUG STORE #...  PREGABALIN  150MG  CAPSULES 07/19/2023 45 90 each Gregg Lek, MD [facility] DRUG STORE #...  PREGABALIN  150MG  CAPSULES 06/08/2023 45 90 each Gregg Lek, MD [facility] DRUG STORE #...  PREGABALIN  150MG  CAPSULES 04/25/2023 45 90 each Camara, Amadou, MD [facility] DRUG STORE #...  PREGABALIN  150MG  CAPSULES 03/08/2023 45 90 each Camara, Amadou, MD [facility] DRUG STORE #...  PREGABALIN  150MG  CAPSULES 01/19/2023 45 90 each Camara, Amadou, MD [facility] DRUG STORE #.[place]

## 2023-12-29 ENCOUNTER — Ambulatory Visit (INDEPENDENT_AMBULATORY_CARE_PROVIDER_SITE_OTHER): Admitting: Internal Medicine

## 2023-12-29 ENCOUNTER — Encounter: Payer: Self-pay | Admitting: Internal Medicine

## 2023-12-29 VITALS — BP 108/68 | HR 90 | Temp 98.3°F | Ht 71.0 in | Wt 215.0 lb

## 2023-12-29 DIAGNOSIS — R0609 Other forms of dyspnea: Secondary | ICD-10-CM

## 2023-12-29 DIAGNOSIS — Z86711 Personal history of pulmonary embolism: Secondary | ICD-10-CM | POA: Diagnosis not present

## 2023-12-29 DIAGNOSIS — Z5181 Encounter for therapeutic drug level monitoring: Secondary | ICD-10-CM | POA: Diagnosis not present

## 2023-12-29 DIAGNOSIS — R0602 Shortness of breath: Secondary | ICD-10-CM | POA: Diagnosis not present

## 2023-12-29 DIAGNOSIS — J84112 Idiopathic pulmonary fibrosis: Secondary | ICD-10-CM | POA: Diagnosis not present

## 2023-12-29 DIAGNOSIS — Z23 Encounter for immunization: Secondary | ICD-10-CM | POA: Diagnosis not present

## 2023-12-29 LAB — HEPATIC FUNCTION PANEL
ALT: 19 U/L (ref 0–53)
AST: 18 U/L (ref 0–37)
Albumin: 4.2 g/dL (ref 3.5–5.2)
Alkaline Phosphatase: 71 U/L (ref 39–117)
Bilirubin, Direct: 0.1 mg/dL (ref 0.0–0.3)
Total Bilirubin: 0.5 mg/dL (ref 0.2–1.2)
Total Protein: 7 g/dL (ref 6.0–8.3)

## 2023-12-29 LAB — D-DIMER, QUANTITATIVE: D-Dimer, Quant: 0.41 ug{FEU}/mL (ref <0.50)

## 2023-12-29 NOTE — Addendum Note (Signed)
 Addended by: Kasir Hallenbeck M on: 12/29/2023 03:10 PM   Modules accepted: Orders

## 2023-12-29 NOTE — Patient Instructions (Addendum)
 IPF (idiopathic pulmonary fibrosis) (HCC) Medication monitoring encounter   IPF progressive over time but clinically stable  on PFT OCt 2023 -> May 2025/Oct 20-25 - Overall tolerating Esbriet  well.  - Glad esbriet  is at no cost - LFT normal July 2024 and March 2025  Plan - check LFT 12/29/2023 -Continue night oxygen  -Continue portable oxygen  -Cotninue esbreit as before - check HRCT feb 2026 =- check spiro/dlco feb 2025 - addendum: at followup consider NErandilomlast  if there is decline  Shortness of breath  - multiple reasons including IPF, physical deconditioning, and weight - glad rehab helped you - echo aug 2025 is reassuring  Plan - monitor     Chronic intermittent lipase elevation since 2023 [started pirfenidone  2022] - still elevated April 2025 even with esbriet  holoiday  Plan  - per GI doctor   Pulmonary embolism small June 2024 requirng o2 in hospital   Echo aug 2025 without RV strain  Plan  -cotninue eliquis ; duration likely 1+ years ->  check D-dimer 12/29/2023   Lung cancer screening  Plan - February 2026 we will do a high-resolution CT chest for ILD instead of of low-dose CT.  Vaccination  Plan - recoemmend high dose flu shot - recommend NOvavax covid vaccine  -Follow-up - Feb 2026 Dr Geronimo; 30 min after CT and PFT ([email or web address])

## 2023-12-29 NOTE — Progress Notes (Signed)
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 Team: [name], Loving RIGGERS as PCP - General (Internal Medicine) Bernie Lamar PARAS, MD as PCP - Cardiology (Cardiology)  This Provider for this visit: Treatment Team:  Attending Provider: Geronimo Amel, MD    09/16/2022 -   Chief Complaint  Patient presents with   Hospitalization Follow-up    Blood clot in lung, not feeling good, Gallbladder pain.  HPI Robert Lang 73 y.o. -returns for post hospital follow-up.  This is an unscheduled visit otherwise.  His pulm fibrosis itself is stable.  He presents with his wife.  Wife is an independent historian.  History is also gained from review of the medical records especially external medical record related to hospitalization 08/28/2022 - 09/01/2022.  He  had dual problems of subsegmental pulmonary embolism and also acute cholecystitis.  Discharged on Eliquis .  Also status post gallbladder drain.  He was on oxygen  but he is now off oxygen .  He is getting home physical therapy.  He is upset about his illness but he feels he is gaining strength.  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  He is tolerating high-dose prescription of pirfenidone  that requires intensive therapeutic monitoring well.  He had blood work 09/13/2022.  Liver function test was normal.  Hemoglobin and kidney function are normal.  Also visualized the CT scan of the chest from August 29, 2022: Agree with the pulmonary embolism and I also feel that the pulmonary fibrosis itself is stable.  With my personal independent of interpretation.    Other issues - She did see psychiatry on 09/14/2022.  His BuSpar  was increased.  Other medications noted above but continued.   10/13/2022- Interim hx  Patient presents today for hospital follow-up.  He was admitted from 10/05/2022 - 10/09/2022 for sepsis secondary to Klebsiella bacteremia.  Originally presented with abdominal pain and decreased cholecystostomy tube output.  He was treated with IV Rocephin  and Flagyl , transition to oral Levaquin  on 10/08/2022.  Prior to this admission he had been treated for cholecystitis for close to 5 weeks, underwent cholecystostomy drain placement first week of June.  Drain repositioned by IR on 10/06/2022.  Patient was cleared by IR and general surgery for discharge.  Will eventually need Cholecystectomy with Dr. Herlene Bureau with [facility] general surgery after clearance with his primary cardiologist and pulmonologist. Tentative date is mid September.   Patient follows with Dr. Geronimo for history of IPF and COPD. Maintained on LABA/LAMA, pirfenidone  and CPAP at bedtime. He was seen by Dr. Lysle in June and IPF felt to be stable since December  2022 based on PFTs and symptoms. Tolerating Esbriet . Patient reports that his O2 was fluctuating prior to hospitalization. He was discharged on 2L which has been approved through 2027. He has been wearing oxygen  at night but not CPAP. He needs adaptor to connect oxygen  to CPAP. Spirometry is scheduled for Sept 5th. He is on Eliquis  for history of paroxysmal A-fib and history of PE.   Airview download 09/12/2022 - 10/11/2022 Usage days 20/30 days (67%); 11 days (37%) greater than 4 hours Average usage days used 4 hours 19 minutes Pressure 5 to 20 cm H2O (9.4 cm H2O 95%) Air leaks 18.8 L/min (95%) AHI 1.0  OV 11/25/2022  Subjective:  Patient ID: Robert Lang, male , DOB: [birth year] , age 3 y.o. , MRN: [id number] , ADDRESS: [address] [place] [address] PCP Dorina Loving, [name] Patient Care Team: Dorina Loving RIGGERS as PCP - General (Internal Medicine) Bernie Lamar PARAS, MD as PCP - Cardiology (Cardiology) Prentiss Heddy HERO, RN as [facility] Care Management  This Provider for this visit: Treatment Team:  Attending Provider: Geronimo Amel, MD   11/25/2022 -   Chief Complaint  Patient presents with   Follow-up    PFT 11/25/22 SOB      HPI Robert Lang 73 y.o. -returns for follow-up.  Last seen in June 2024.  Wife is here and is an independent historian.  He is still dealing with his gallbladder issues.  His gallbladder drain is still there.  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  Since then he states he been hospitalized couple of times most recently July 2024 for sepsis related to his gallbladder.  Currently he is at home and doing physical therapy and reconditioning quite well.  He is now on oxygen  therapy at night and in the daytime.  He wants portable oxygen .  He feels his oxygen  is helping him.  His pulmonary function test appears to be stable in the last year but progressed compared to a year and  a half ago.  We did a sit/stand hypoxemia test and his pulse ox is normal on room air at rest but he does desaturate very transiently to 88% at the end of exertion.  There is a huge improvement though.  He wants a preop clearance and so to see.  I did tell him that is now 3 months since he had his blood clot and given the improvement in hypoxemia it may not be a bad time to do a cholecystectomy although he is at risk for recurrence of blood clot in the immediate days after surgery.  He understands this but he wants his drain out.  He  did agree to get a BNP checked.  Recently in July 2012 is high and a D-dimer and I will do some restratification.SABRA  He does want portable oxygen ..     OV 06/14/2023  Subjective:  Patient ID: Robert Lang, male , DOB: 05/30/1950 , age 1 y.o. , MRN: [id number] , ADDRESS: [address] [place] [address] PCP Dorina Loving, [name] Patient Care Team: [name], Loving RIGGERS as PCP - General (Internal Medicine) Bernie Lamar PARAS, MD as PCP - Cardiology (Cardiology)  This Provider for this visit: Treatment Team:  Attending Provider: Geronimo Amel, MD    06/14/2023 -   Chief Complaint  Patient presents with   Follow-up    Ipf, wants to discuss if he needs to be on oxygen  all the time, oxygen  levels drop when he gets up and start moving around a lot, want to [place] a trip out west driving, on 2 liters of oxygen , discuss results of chest xray      HPI Robert Lang 73 y.o. -returns for follow-up.  Presents with his wife.  Since I last saw him in September 2024 no admissions other than for cholecystectomy.  He states he is been doing well his physical conditioning is better but he states he is a little more short of breath and he seems to be desaturating a little more easily than in the past.  Today he desaturated doing a sit/stand 6 times [last time it was 10 times].  He did have a lung cancer screening chest in February 2025 ILD is not reported on  that but I personally visualized it and he does have ILD.  In fact he has crackles.  He is taking full dose pirfenidone  and is tolerating it well.  He brought to my attention that he is having intermittent chronic lipase elevations.  I reviewed his lab results and's been going on since 2023.  This was after he started his pirfenidone .  I did indicate to him I did see 1 IPF patient was on nintedanib have unexplained lipase elevations and it was unrelated to nintedanib.  Nevertheless we took a shared decision making to give his pirfenidone  a holiday and recheck his lipase.  He is willing to do that.  But his biggest concern is desaturations with exercise.  A year ago he did not have any pulmonary hypertension.  He says when he takes a shower his pulse ox on room air drops to 88%.  On 2 L he is around 93%.  His last pulmonary function test was in September 2024.  I could not determine if he had progressed and is CT scan because is a low-dose CT scan.     She also plans to go to [place]  next.  2026.  I told him we will discuss this later.    OV 08/19/2023  Subjective:  Patient ID: Robert Lang, male , DOB: 03/31/1950 , age 57 y.o. , MRN: [id number] , ADDRESS: [address] [place] [address] PCP Dorina Loving, [name] Patient Care Team: [name], Loving RIGGERS as PCP - General (Internal Medicine) Bernie Lamar PARAS, MD as PCP - Cardiology (Cardiology)  This Provider for this visit: Treatment Team:  Attending Provider: Geronimo Amel, MD    08/19/2023 -   Chief Complaint  Patient presents with   Follow-up    Post PFT SOB   Robert Lang 73 y.o. - HPI returns for his IPF follow-up.  Back in February 2025 he had a low-dose CT scan of the  chest so we cannot make comparisons of his IPF.  But since sept 2024 noticed exercise hypoxemia < 88% and then in February 2025 he had a CT scan of the chest but unfortunate was low-dose CT scan of the chest.  He ise is now complaining of  ongoing persistent shortness of breath with exertion relieved by rest.  He also states when he is without oxygen  for 30 minutes at rest he will feel tired and short of breath and will need to put the oxygen .  He does not check his pulse ox.  Nevertheless he feels he needs his oxygen .  He is frustrated he is not able to come off oxygen .  He also states that for his morning routine such as taking a shower or shaving and changing clothes he is has to break it into 3 phases this because of increasing shortness of breath.  He did have pulmonary function test today that shows a decline compared to September 2024.  This fits in with the progressive hypoxemia he has had starting around September 2024.  We currently do not have any other treatment options but in a few months we believe that a new medication called NERANDRALIMILAST will be approved.  This has some amount of diarrhea.  I did discuss that we will discuss this with him at the time of follow-up.  Meanwhile we will refer him to pulm rehabilitation.  Last echo was in August 2024.  Will also get a repeat echo at the time of follow-up.  Independent of this he has multifactoral dyspnea on exertion because of his obesity and physical deconditioning.  He is agreed to go to pulmonary rehabilitation for this.     OV 12/29/2023  Subjective:  Patient ID: Robert Lang, male , DOB: Jan 13, 1951 , age 49 y.o. , MRN: [id number] , ADDRESS: [address] 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  2 antihypertensives had to be stopped -Stable high-resolution CT chest April 2023 compared to February 2022 - LDCT Feb 2025 - LAST  High risk prescription of pirfenidone   - Esbriet /Pirfenidone  requires intensive drug monitoring due to high concerns for Adverse effects of , including  Drug Induced Liver Injury, significant GI side effects that include but not limited to Diarrhea, Nausea, Vomiting,  and other system side effects that include Fatigue, headaches, weight loss and other side effects such as skin rash. These will be monitored with  blood work such as LFT initially once a month for 6 months and then quarterly    Mild associated emphysema  -Noticed in April 2023 high-resolution CT chest and started on Stiolto  Atrial flutter status post ablation 2017  -On Xarelto   Grade 1 diastolic dysfunction  -Echocardiogram Sept 2023 (no PASP)  - August 2024: No PAP eelvation   Coronary artery disease  -On GDMT treatment  OSA CPAP pending end of 2022   Mild gait unsteadiness due to spinal issues     - uses cane  Long-term turmeric use  Lung nodules  - feb 2022:L Three scattered solid basilar left lower lobe pulmonary nodules, largest 7 mm posteromedially.   -CT chest April 2023 describes this as 4 mm subpleural nodules.   - LAst CT FEb 2025 - Lung RADS 2  History of COVID-19 Memorial Day weekend 2023   Admission early June 2023 with a syncope  -Antviral Paxlovid  considered as an etiology and dehydrtion  -MRI negative, EEG unremarkable  EKG with bifascicular block  -Cleared by neurology September 15, 2021 outpatient  -ZIO monitor placed June 2023 wounds.\  - QtcF 04/20/2022  Prolonged Qtc >  - QTcF - 04/20/2022  - QTc 478 msec on EKG June 2024 personally visualzied and itnerpreted  - Qtc 463 mosec July 2024 -    Admission 08/28/2022 - 09/01/2022  -Small subsegmental pulmonary embolism  treatment with anticoagulation  -Acute cholecystitis status post cholecystostomy -October 2024  was admitted from 10/05/2022 - 10/09/2022 for sepsis secondary to Klebsiella bacteremia.   Depressive disorder, generalized anxiety disorder and insomnia  -Under the care of [employer] nurse practitioner psychiatry  -On Zoloft , trazodone , BuSpar , Lamictal , lorazepam , Wellbutrin   12/29/2023 -   Chief Complaint  Patient presents with   Follow-up    SOB with any activity  Pt stated since last visit breathing has not gotten any better, its has gotten worse, pt states pulmonary rehab helped a lot ( walked 2 blocks in 15 minutes with no disturbance)      HPI Robert Lang 72 y.o. -returns for follow-up.  Presents with his wife.  They believe after pulmonary rehabilitation he is somewhat better he is moving around the house better.  He walks 1 block without a problem but 2 blocks he has to sit many times and he is quite short of breath.  It took him 15 minutes to complete 2 blocks.  He is also getting dyspneic after showering before he changes close he has to rest.  Nevertheless he continues with his pirfenidone  without any problems.  The pirfenidone  has been renewed at no cost by the grant.  He continues his oxygen .  He feels overall stable.  He still does not lost weight.  He continues nighttime oxygen  and daytime oxygen  and pirfenidone .  Last CT scan was a low-dose CT scan February 2025.  He actually needs a high-res CT scan in February 2026.  He had pulmonary embolism a year ago.  August 2025 his echocardiogram did not show any RV strain.  Advised him we will check a D-dimer today.                     SYMPTOM SCALE - ILD 06/17/2020   07/28/2020   09/09/2020 184# 11/18/2020 185#  Esbriet  801mg  BID 12/19/2020   Esbriet  801mg  TID  02/19/2021 187# 06/23/2021 180# 07/14/2021 Esbriet  801mg  TID  09/24/2021 188# - esbreit and also stiolto 01/07/2022 206# 04/20/2022 200# esbriet  11/25/2022 191# -  esbriet  06/14/2023 202#  O2 use ra RA     RA   ra0 RA  ra ra ra ra ra  Shortness of Breath 0 -> 5 scale with 5 being worst (score 6 If unable to do) 0-5               At rest 3 0.5 2 4 3   0 3 2 3 3 3 3   Simple tasks - showers, clothes change, eating, shaving 5 3 3 3 2  3 3 4 4 2 4 5   Household (dishes, doing  bed, laundry) 5 3 (he does not do these tasks) 5 0 3 (he does not do these tasks)  0 NA 4 5 2 4  x  Shopping 5 3 (he does not do this often) 3 1 3  (he does not do these tasks)  4 3.5 (uses scooter) 4 5 2 3 5   Walking level at own pace 4 3 3 1 3  5 3 3 3 2 3 5   Walking up Stairs 5 5 4 1 3  5  3.5 4 5 2 4 5   Total (30-36) Dyspnea Score 27 17.5 20 10 17  17 16 21 25 13 21 23   How bad is your cough? 3 2 1  fair 1  0 1 2 2 2 3 2   How bad is your fatigue 4 2 4  fair 3  3 4 3 5 2 4 5   How bad is nausea 0 0 3 good 2  3 2 2 2  0 3 1  How bad is vomiting?   0 0 0 good 0  0 0 0 0 0 0 0  How bad is diarrhea? 3 0 0 good 0  0 0 1 2 0 0 1  How bad is anxiety? 5 5 3  fair 2  5 5 3 5 3 4 5   How bad is depression 5 5 3  faor 3  3 4 3 4 3 4 4          Simple office walk 185 feet x  3 laps goal with forehead probe 06/23/2021  09/24/2021  01/07/2022  09/16/2022   ra  Sit stand x 5 with cane  stead  93% and 102/min  92% and 109/min  no  no  yes  x  x   11/25/2022  06/14/2023   O2 used ra ra ra  ra ra  Number laps completed 2 of 3 Did all 3 withe cane Did all 3 with cane  Sit stand x 10 Sit stand x 15  Comments about pace Not done Slow pace with cane Slow pace - stopped 2-3 times in fist lap  Good pace Sopped at 6 got tired  Resting Pulse Ox/HR 98% and 97/min 99% and HR 102 99% and HR 86  96% and HR 92 96% nahd HR 85  Final Pulse Ox/HR 97% and 101/min 99% and HR 112 98% and HR 100  88% and HR 101 85% and HR 93  Desaturated </= 88% no no no     Desaturated <= 3% points no no no     Got Tachycardic >/= 90/min yes Ye even at rest yes     Symptoms at end of test Not rcorded Modrate dyspnea Moderate to  severe dyspnea  7 of 10 dyspnea Very winded  Miscellaneous comments x          PFT     Latest Ref Rng & Units 10/24/2023    1:30 PM 08/19/2023    7:56 AM 11/25/2022    8:56 AM 04/20/2022    9:18 AM 01/07/2022    8:30 AM 06/23/2021    9:23 AM 02/19/2021   11:47 AM  PFT Results  FVC-Pre L 3.05  2.79  3.08  3.11  3.05  3.48  3.49   FVC-Predicted Pre % 67  62  68  68  66  76  75   Pre FEV1/FVC % % 73  74  76  75  73  76  73   FEV1-Pre L 2.22  2.06  2.34  2.34  2.21  2.64  2.56   FEV1-Predicted Pre % 67  62  70  70  66  79  75   DLCO uncorrected ml/min/mmHg 17.06  15.84  17.72  17.01  16.62  17.94  20.29   DLCO UNC% % 65  60  67  64  62  67  75   DLCO corrected ml/min/mmHg   18.88  17.01  16.62  17.94  20.29   DLCO COR %Predicted %   71  64  62  67  75   DLVA Predicted % 83  80  82  74  74  79  81        LAB RESULTS last 96 hours No results found.       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), Allergic rhinitis due to pollen (09/03/2013), Allergy, Anticoagulated (07/01/2016), Anxiety, Arthritis, Atrial fibrillation (HCC), Atrial flutter (HCC) (06/26/2015), CAD (coronary artery disease), Chest pain (06/26/2015), COPD (chronic obstructive pulmonary disease) (HCC), Coronary artery disease (07/06/2018), DDD (degenerative disc disease), cervical (09/03/2013), Depression, Dizziness (10/28/2017), Dyslipidemia, goal LDL below 70 (07/06/2018), Dyspnea on exertion (10/20/2018), Dysrhythmia, Essential hypertension (07/06/2018), ETOH abuse (01/11/2019), Falls (10/28/2017), GERD (gastroesophageal reflux disease), H/O amiodarone therapy (07/01/2016), Heart palpitations (01/27/2017), History of colon polyps, History of kidney stones, Hyperlipidemia, Hypertension, IPF (idiopathic pulmonary fibrosis) (HCC), Neck pain (09/03/2013), Neuropathy (07/06/2018), Obstructive sleep apnea  (08/05/2015), PLMD (periodic limb movement disorder) (11/04/2015), Polycythemia, 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   reports that he quit smoking about 4 years ago. His smoking use included cigarettes. He started smoking about 34 years ago. He has a 30 pack-year smoking history. He has quit using smokeless tobacco.  Past Surgical History:  Procedure Laterality Date   ATRIAL FIBRILLATION ABLATION  10/2015   BACK SURGERY     CARDIOVERSION  2017   CATARACT EXTRACTION Bilateral 2017   March and April 2017   CHOLECYSTECTOMY N/A 12/21/2022   Procedure: LAPAROSCOPIC CHOLECYSTECTOMY AND REMOVAL OF BILIARY DRAIN;  Surgeon: Stevie Herlene Righter, MD;  Location: [facility];  Service: General;  Laterality: N/A;   COLONOSCOPY  2018   CORONARY STENT PLACEMENT  2012   CORONARY/GRAFT ACUTE MI REVASCULARIZATION N/A 01/10/2019   Procedure: CORONARY/GRAFT ACUTE MI REVASCULARIZATION;  Surgeon: Anner Alm ORN, MD;  Location[facility] INVASIVE CV LAB;  Service: Cardiovascular;  Laterality: N/A;   INGUINAL HERNIA REPAIR     over 20 years ago   IR CHOLANGIOGRAM EXISTING TUBE  09/16/2022   IR EXCHANGE BILIARY DRAIN  10/06/2022   IR PERC CHOLECYSTOSTOMY  08/30/2022   LAPAROSCOPIC APPENDECTOMY N/A 01/17/2021   Procedure: APPENDECTOMY LAPAROSCOPIC;  Surgeon: Lyndel Deward PARAS, MD;  Location: [facility] OR;  Service: General;  Laterality: N/A;   LEFT HEART CATH AND CORONARY ANGIOGRAPHY N/A 01/10/2019   Procedure: LEFT HEART CATH AND CORONARY ANGIOGRAPHY;  Surgeon: Anner Alm ORN, MD;  Location: [facility] INVASIVE CV LAB;  Service: Cardiovascular;  Laterality: N/A;   LUMBAR LAMINECTOMY Bilateral 04/05/2016   L2-L5    VENTRAL HERNIA REPAIR  2018    Allergies  Allergen Reactions   Naproxen Other (See Comments)    (Naprosyn *ANALGESICS - ANTI-INFLAMMATORY*) Nausea, Abdominal pain   Rapaflo [Silodosin] Other  (See Comments)    Low blood pressure    Immunization History  Administered Date(s) Administered   INFLUENZA, HIGH DOSE SEASONAL PF 11/15/2018,  01/11/2020, 12/27/2020, 12/24/2021   Influenza Split 01/09/2010, 02/09/2011   Influenza,inj,Quad PF,6+ Mos 01/01/2015   Influenza-Unspecified 01/30/2014, 02/02/2016, 12/15/2020   PFIZER(Purple Top)SARS-COV-2 Vaccination 04/27/2019, 05/18/2019, 01/14/2020, 07/22/2020, 12/15/2020, 12/24/2021   Pneumococcal Conjugate-13 11/23/2018   Pneumococcal Polysaccharide-23 08/12/2020   Respiratory Syncytial Virus Vaccine,Recomb Aduvanted(Arexvy) 12/17/2021   Tdap 02/18/2011    Family History  Problem Relation Age of Onset   Anxiety disorder Mother    Alcohol  abuse Father    Colon cancer Father    Depression Brother    Alcohol  abuse Brother    Throat cancer Brother      Current Outpatient Medications:    acetaminophen  (TYLENOL ) 500 MG tablet, Take 1 tablet (500 mg total) by mouth every 8 (eight) hours as needed for moderate pain., Disp: 100 tablet, Rfl: 0   atorvastatin  (LIPITOR ) 80 MG tablet, Take 1 tablet (80 mg total) by mouth daily., Disp: 90 tablet, Rfl: 3   buPROPion  (WELLBUTRIN  XL) 150 MG 24 hr tablet, Take 1 tablet (150 mg total) by mouth daily., Disp: 30 tablet, Rfl: 3   busPIRone  (BUSPAR ) 15 MG tablet, Take 1 tablet (15 mg total) by mouth 3 (three) times daily., Disp: 90 tablet, Rfl: 3   Cholecalciferol (VITAMIN D3) 50 MCG (2000 UT) TABS, Take 4,000 Units by mouth 2 (two) times daily., Disp: , Rfl:    docusate sodium  (COLACE) 100 MG capsule, Take 100 mg by mouth daily., Disp: , Rfl:    ELIQUIS  5 MG TABS tablet, TAKE 1 TABLET(5 MG) BY MOUTH TWICE DAILY, Disp: 60 tablet, Rfl: 5   fexofenadine (ALLEGRA) 180 MG tablet, Take 180 mg by mouth at bedtime. , Disp: , Rfl:    finasteride  (PROSCAR ) 5 MG tablet, Take 1 tablet (5 mg total) by mouth daily., Disp: 90 tablet, Rfl: 1   lamoTRIgine  (LAMICTAL ) 100 MG tablet, Take 1 tablet (100 mg total) by mouth  daily., Disp: 90 tablet, Rfl: 1   LORazepam  (ATIVAN ) 0.5 MG tablet, TAKE 1 TABLET BY MOUTH ONLY FOR SEVERE ANXIETY OR AGITATION, Disp: 30 tablet, Rfl: 3   MUCINEX  600 MG 12 hr tablet, TAKE 2 TABLETS BY MOUTH TWICE DAILY, Disp: 120 tablet, Rfl: 0   nitroGLYCERIN  (NITROSTAT ) 0.4 MG SL tablet, Place 1 tablet (0.4 mg total) under the tongue every 5 (five) minutes as needed for chest pain., Disp: 25 tablet, Rfl: 3   ondansetron  (ZOFRAN ) 4 MG tablet, Take 1 tablet (4 mg total) by mouth every 8 (eight) hours as needed for nausea or vomiting., Disp: 20 tablet, Rfl: 0   OXYGEN , Inhale 2 L into the lungs daily., Disp: , Rfl:    pantoprazole  (PROTONIX ) 40 MG tablet, Take 1 tablet (40 mg total) by mouth daily., Disp: 30 tablet, Rfl: 6   Pirfenidone  (ESBRIET ) 801 MG TABS, Take 1 tablet (801 mg total) by mouth 3 (three) times daily., Disp: 270 tablet, Rfl: 1   polyethylene glycol powder (GLYCOLAX /MIRALAX ) 17 GM/SCOOP powder, Take 17 g by mouth 2 (two) times daily., Disp: , Rfl:    pregabalin  (LYRICA ) 150 MG capsule, TAKE 1 CAPSULE(150 MG) BY MOUTH TWICE DAILY, Disp: 60 capsule, Rfl: 5   ranolazine  (RANEXA ) 1000 MG SR tablet, Take 1 tablet (1,000 mg total) by mouth 2 (two) times daily., Disp: 180 tablet, Rfl: 2   sertraline  (ZOLOFT ) 100 MG tablet, TAKE 2 TABLETS(200 MG) BY MOUTH AT BEDTIME, Disp: 180 tablet, Rfl: 0   traMADol  (ULTRAM ) 50 MG tablet, Take 1 tablet (50 mg total) by mouth at bedtime., Disp: 30 tablet, Rfl:  5   traZODone  (DESYREL ) 50 MG tablet, TAKE 1 TABLET(50 MG) BY MOUTH AT BEDTIME, Disp: 90 tablet, Rfl: 0   vitamin B-12 (CYANOCOBALAMIN ) 1000 MCG tablet, Take 1,000 mcg by mouth daily., Disp: , Rfl:    vitamin E 1000 UNIT capsule, Take 1,000 Units by mouth 2 (two) times daily at 8 am and 10 pm., Disp: , Rfl:    zolpidem  (AMBIEN ) 5 MG tablet, TAKE 1 TABLET(5 MG) BY MOUTH AT BEDTIME, Disp: 30 tablet, Rfl: 3   famotidine  (PEPCID ) 40 MG tablet, Take 1 tablet (40 mg total) by mouth daily. (Patient not  taking: Reported on 12/29/2023), Disp: 90 tablet, Rfl: 3   levalbuterol  (XOPENEX ) 1.25 MG/0.5ML nebulizer solution, Take 1.25 mg by nebulization every 4 (four) hours as needed for wheezing or shortness of breath. (Patient not taking: Reported on 12/29/2023), Disp: 1 each, Rfl: 12   Melatonin 10 MG TABS, Take 10 mg by mouth at bedtime. (Patient not taking: Reported on 12/29/2023), Disp: , Rfl:    Multiple Vitamins-Minerals (PRESERVISION AREDS PO), Take 1 capsule by mouth in the morning and at bedtime. (Patient not taking: Reported on 12/29/2023), Disp: , Rfl:  No current facility-administered medications for this visit.  Facility-Administered Medications Ordered in Other Visits:    regadenoson  (LEXISCAN ) injection SOLN 0.4 mg, 0.4 mg, Intravenous, Once, Tobb, Kardie, DO   technetium tetrofosmin  (TC-MYOVIEW ) injection 31.7 millicurie, 31.7 millicurie, Intravenous, Once PRN, Tobb, Kardie, DO      Objective:   Vitals:   12/29/23 1408  BP: 108/68  Pulse: 90  Temp: 98.3 F (36.8 C)  TempSrc: Oral  SpO2: 95%  Weight: 215 lb (97.5 kg)  Height: 5' 11 (1.803 m)    Estimated body mass index is 29.99 kg/m as calculated from the following:   Height as of this encounter: 5' 11 (1.803 m).   Weight as of this encounter: 215 lb (97.5 kg).  @WEIGHTCHANGE @  Filed Weights   12/29/23 1408  Weight: 215 lb (97.5 kg)     Physical Exam   General: No distress. Looks well O2 at rest: no Cane present:YES Sitting in wheel chair: no Frail: no Obese: no Neuro: Alert and Oriented x 3. GCS 15. Speech normal Psych: Pleasant Resp:  Barrel Chest - no.  Wheeze - no, Crackles - yes, No overt respiratory distress CVS: Normal heart sounds. Murmurs - no Ext: Stigmata of Connective Tissue Disease - no HEENT: Normal upper airway. PEERL +. No post nasal drip        Assessment/     Assessment & Plan IPF (idiopathic pulmonary fibrosis) (HCC)    PLAN Patient Instructions  IPF (idiopathic pulmonary  fibrosis) (HCC) Medication monitoring encounter   IPF progressive over time but clinically stable  on PFT OCt 2023 -> May 2025/Oct 20-25 - Overall tolerating Esbriet  well.  - Glad esbriet  is at no cost - LFT normal July 2024 and March 2025  Plan - check LFT 12/29/2023 -Continue night oxygen  -Continue portable oxygen  -Cotninue esbreit as before - check HRCT feb 2026 =- check spiro/dlco feb 2025 - addendum: at followup consider NErandilomlast  if there is decline  Shortness of breath  - multiple reasons including IPF, physical deconditioning, and weight - glad rehab helped you - echo aug 2025 is reassuring  Plan - monitor     Chronic intermittent lipase elevation since 2023 [started pirfenidone  2022] - still elevated April 2025 even with esbriet  holoiday  Plan  - per GI doctor   Pulmonary embolism small June 2024  requirng o2 in hospital   Echo aug 2025 without RV strain  Plan  -cotninue eliquis ; duration likely 1+ years ->  check D-dimer 12/29/2023   Lung cancer screening  Plan - February 2026 we will do a high-resolution CT chest for ILD instead of of low-dose CT.  Vaccination  Plan - recoemmend high dose flu shot - recommend NOvavax covid vaccine  -Follow-up - Feb 2026 Dr Geronimo; 30 min after CT and PFT (spiro.dlco)    FOLLOWUP    Return for - Feb 2026 Dr Geronimo; 30 min after CT and PFT (spiro.dlco).    SIGNATURE    Dr. Dorethia Geronimo, M.D., F.C.C.P,  Pulmonary and Critical Care Medicine Staff Physician, [facility] Director - Interstitial Lung Disease  Program  Pulmonary Fibrosis [facility] at [facility] [place], [place], [address]  Pager: [phone], If no answer or between  15:00h - 7:00h: call [phone] Telephone: [phone]  2:58 PM 12/29/2023

## 2024-01-03 ENCOUNTER — Ambulatory Visit: Payer: Medicare Other

## 2024-01-03 VITALS — Ht 71.0 in | Wt 205.0 lb

## 2024-01-03 DIAGNOSIS — Z0000 Encounter for general adult medical examination without abnormal findings: Secondary | ICD-10-CM

## 2024-01-03 NOTE — Progress Notes (Signed)
 Subjective:   Robert Lang is a 73 y.o. who presents for a Medicare Wellness preventive visit.  As a reminder, Annual Wellness Visits don't include a physical exam, and some assessments may be limited, especially if this visit is performed virtually. We may recommend an in-person follow-up visit with your provider if needed.  Visit Complete: Virtual I connected with  Robert Lang on 01/03/24 by a video and audio enabled telemedicine application and verified that I am speaking with the correct person using two identifiers.  Patient Location: Home  Provider Location: Home Office  I discussed the limitations of evaluation and management by telemedicine. The patient expressed understanding and agreed to proceed.  Vital Signs: Because this visit was a virtual/telehealth visit, some criteria may be missing or patient reported. Any vitals not documented were not able to be obtained and vitals that have been documented are patient reported.    Persons Participating in Visit: Patient.  AWV Questionnaire: No: Patient Medicare AWV questionnaire was not completed prior to this visit.  Cardiac Risk Factors include: advanced age (>23men, >50 women);hypertension;male gender     Objective:    Today's Vitals   01/03/24 1448  Weight: 205 lb (93 kg)  Height: 5' 11 (1.803 m)   Body mass index is 28.59 kg/m.     01/03/2024    2:58 PM 09/07/2023   11:13 AM 05/09/2023   11:24 AM 12/30/2022    1:36 PM 12/21/2022    5:48 AM 12/15/2022   11:15 AM 10/05/2022    3:38 PM  Advanced Directives  Does Patient Have a Medical Advance Directive? Yes Yes No Yes Yes Yes Yes  Type of Estate agent of [place];Living will Healthcare Power of [place];Living will  Healthcare Power of [place];Living will Healthcare Power of [place];Living will Healthcare Power of [place];Living will Healthcare Power of Attorney  Does patient want to make changes to medical advance directive? No  - Patient declined No - Patient declined  No - Patient declined No - Patient declined  No - Patient declined  Copy of Healthcare Power of Attorney in Chart? Yes - validated most recent copy scanned in chart (See row information) No - copy requested  Yes - validated most recent copy scanned in chart (See row information) No - copy requested    Would patient like information on creating a medical advance directive?   No - Patient declined        Current Medications (verified) Outpatient Encounter Medications as of 01/03/2024  Medication Sig   acetaminophen  (TYLENOL ) 500 MG tablet Take 1 tablet (500 mg total) by mouth every 8 (eight) hours as needed for moderate pain.   atorvastatin  (LIPITOR ) 80 MG tablet Take 1 tablet (80 mg total) by mouth daily.   buPROPion  (WELLBUTRIN  XL) 150 MG 24 hr tablet Take 1 tablet (150 mg total) by mouth daily.   busPIRone  (BUSPAR ) 15 MG tablet Take 1 tablet (15 mg total) by mouth 3 (three) times daily.   Cholecalciferol (VITAMIN D3) 50 MCG (2000 UT) TABS Take 4,000 Units by mouth 2 (two) times daily.   docusate sodium  (COLACE) 100 MG capsule Take 100 mg by mouth daily.   ELIQUIS  5 MG TABS tablet TAKE 1 TABLET(5 MG) BY MOUTH TWICE DAILY   famotidine  (PEPCID ) 40 MG tablet Take 1 tablet (40 mg total) by mouth daily. (Patient not taking: Reported on 12/29/2023)   fexofenadine (ALLEGRA) 180 MG tablet Take 180 mg by mouth at bedtime.    finasteride  (PROSCAR ) 5 MG  tablet Take 1 tablet (5 mg total) by mouth daily.   lamoTRIgine  (LAMICTAL ) 100 MG tablet Take 1 tablet (100 mg total) by mouth daily.   levalbuterol  (XOPENEX ) 1.25 MG/0.5ML nebulizer solution Take 1.25 mg by nebulization every 4 (four) hours as needed for wheezing or shortness of breath. (Patient not taking: Reported on 12/29/2023)   LORazepam  (ATIVAN ) 0.5 MG tablet TAKE 1 TABLET BY MOUTH ONLY FOR SEVERE ANXIETY OR AGITATION   Melatonin 10 MG TABS Take 10 mg by mouth at bedtime. (Patient not taking: Reported on  12/29/2023)   MUCINEX  600 MG 12 hr tablet TAKE 2 TABLETS BY MOUTH TWICE DAILY   Multiple Vitamins-Minerals (PRESERVISION AREDS PO) Take 1 capsule by mouth in the morning and at bedtime. (Patient not taking: Reported on 12/29/2023)   nitroGLYCERIN  (NITROSTAT ) 0.4 MG SL tablet Place 1 tablet (0.4 mg total) under the tongue every 5 (five) minutes as needed for chest pain.   ondansetron  (ZOFRAN ) 4 MG tablet Take 1 tablet (4 mg total) by mouth every 8 (eight) hours as needed for nausea or vomiting.   OXYGEN  Inhale 2 L into the lungs daily.   pantoprazole  (PROTONIX ) 40 MG tablet Take 1 tablet (40 mg total) by mouth daily.   Pirfenidone  (ESBRIET ) 801 MG TABS Take 1 tablet (801 mg total) by mouth 3 (three) times daily.   polyethylene glycol powder (GLYCOLAX /MIRALAX ) 17 GM/SCOOP powder Take 17 g by mouth 2 (two) times daily.   pregabalin  (LYRICA ) 150 MG capsule TAKE 1 CAPSULE(150 MG) BY MOUTH TWICE DAILY   ranolazine  (RANEXA ) 1000 MG SR tablet Take 1 tablet (1,000 mg total) by mouth 2 (two) times daily.   sertraline  (ZOLOFT ) 100 MG tablet TAKE 2 TABLETS(200 MG) BY MOUTH AT BEDTIME   traMADol  (ULTRAM ) 50 MG tablet Take 1 tablet (50 mg total) by mouth at bedtime.   traZODone  (DESYREL ) 50 MG tablet TAKE 1 TABLET(50 MG) BY MOUTH AT BEDTIME   vitamin B-12 (CYANOCOBALAMIN ) 1000 MCG tablet Take 1,000 mcg by mouth daily.   vitamin E 1000 UNIT capsule Take 1,000 Units by mouth 2 (two) times daily at 8 am and 10 pm.   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 M75.00)   Allergic rhinitis due to pollen 09/03/2013   J30.1)  Formatting of this note might be different from the original. J30.1)   Allergy    Anticoagulated 07/01/2016   Anxiety    Arthritis    Atrial fibrillation (HCC)    Atrial flutter (HCC) 06/26/2015   CAD (coronary artery disease)    Chest pain 06/26/2015   COPD (chronic obstructive pulmonary disease) (HCC)    Coronary artery disease 07/06/2018   Cardiac catheterization 2017 showing 90% small diagonal branch disease   DDD (degenerative disc disease), cervical 09/03/2013   M50.90)  Formatting of this note might be different from the original. M50.90)   Depression    Dizziness 10/28/2017   Dyslipidemia, goal LDL below 70 07/06/2018   Dyspnea on exertion 10/20/2018   Dysrhythmia  Essential hypertension 07/06/2018   ETOH abuse 01/11/2019   6 pack of beer per day   Falls 10/28/2017   GERD (gastroesophageal reflux disease)    H/O amiodarone therapy 07/01/2016   Heart palpitations 01/27/2017   History of colon polyps    History of kidney stones    Hyperlipidemia    Hypertension    IPF (idiopathic pulmonary fibrosis) (HCC)    Neck pain 09/03/2013   Neuropathy 07/06/2018   Obstructive sleep apnea 08/05/2015   PLMD (periodic limb movement disorder) 11/04/2015   Polycythemia, 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 STORY: Due to alcohol / tobacco   Pure hypercholesterolemia 09/03/2013   E78.0)  Formatting of this note might be different from the original. 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/06/2018   2017   Past Surgical History:  Procedure Laterality Date   ATRIAL FIBRILLATION ABLATION  10/2015   BACK SURGERY     CARDIOVERSION  2017   CATARACT EXTRACTION Bilateral 2017   March and April 2017   CHOLECYSTECTOMY  N/A 12/21/2022   Procedure: LAPAROSCOPIC CHOLECYSTECTOMY AND REMOVAL OF BILIARY DRAIN;  Surgeon: Stevie Herlene Righter, MD;  Location: [facility];  Service: General;  Laterality: N/A;   COLONOSCOPY  2018   CORONARY STENT PLACEMENT  2012   CORONARY/GRAFT ACUTE MI REVASCULARIZATION N/A 01/10/2019   Procedure: CORONARY/GRAFT ACUTE MI REVASCULARIZATION;  Surgeon: Anner Alm ORN, MD;  Location: [facility] INVASIVE CV LAB;  Service: Cardiovascular;  Laterality: N/A;   INGUINAL HERNIA REPAIR     over 20 years ago   IR CHOLANGIOGRAM EXISTING TUBE  09/16/2022   IR EXCHANGE BILIARY DRAIN  10/06/2022   IR PERC CHOLECYSTOSTOMY  08/30/2022   LAPAROSCOPIC APPENDECTOMY N/A 01/17/2021   Procedure: APPENDECTOMY LAPAROSCOPIC;  Surgeon: Lyndel Deward PARAS, MD;  Location: [facility] OR;  Service: General;  Laterality: N/A;   LEFT HEART CATH AND CORONARY ANGIOGRAPHY N/A 01/10/2019   Procedure: LEFT HEART CATH AND CORONARY ANGIOGRAPHY;  Surgeon: Anner Alm ORN, MD;  Location: [facility] INVASIVE CV LAB;  Service: Cardiovascular;  Laterality: N/A;   LUMBAR LAMINECTOMY Bilateral 04/05/2016   L2-L5    VENTRAL HERNIA REPAIR  2018   Family History  Problem Relation Age of Onset   Anxiety disorder Mother    Alcohol  abuse Father    Colon cancer Father    Depression Brother    Alcohol  abuse Brother    Throat cancer Brother    Social History   Socioeconomic History   Marital status: Married    Spouse name: Not on file   Number of children: Not on file   Years of education: Not on file   Highest education level: 12th grade  Occupational History   Not on file  Tobacco Use   Smoking status: Former    Current packs/day: 0.00    Average packs/day: 1 pack/day for 30.0 years (30.0 ttl pk-yrs)    Types: Cigarettes    Start date: 01/09/1989    Quit date: 01/10/2019    Years since quitting: 4.9   Smokeless tobacco: Former  Building services engineer status: Never Used  Substance and Sexual Activity   Alcohol  use: Not Currently    Comment:  8oz of wine a day   Drug use: Never   Sexual activity: Not on file  Other Topics Concern   Not on file  Social History Narrative  Lives with wife   Social Drivers of Health   Financial Resource Strain: Low Risk  (01/03/2024)   Overall Financial Resource Strain (CARDIA)    Difficulty of Paying Living Expenses: Not hard at all  Food Insecurity: No Food Insecurity (01/03/2024)   Hunger Vital Sign    Worried About Running Out of Food in the Last Year: Never true    Ran Out of Food in the Last Year: Never true  Transportation Needs: No Transportation Needs (01/03/2024)   PRAPARE - Administrator, Civil Service (Medical): No    Lack of Transportation (Non-Medical): No  Physical Activity: Insufficiently Active (01/03/2024)   Exercise Vital Sign    Days of Exercise per Week: 3 days    Minutes of Exercise per Session: 20 min  Stress: No Stress Concern Present (01/03/2024)   [employer] of Occupational Health - Occupational Stress Questionnaire    Feeling of Stress: Not at all  Social Connections: Socially Integrated (01/03/2024)   Social Connection and Isolation Panel    Frequency of Communication with Friends and Family: More than three times a week    Frequency of Social Gatherings with Friends and Family: More than three times a week    Attends Religious Services: More than 4 times per year    Active Member of [employer] or Organizations: Yes    Attends Engineer, structural: More than 4 times per year    Marital Status: Married    Tobacco Counseling Counseling given: Not Answered    Clinical Intake:  Pre-visit preparation completed: Yes  Pain : No/denies pain     BMI - recorded: 28.59 Nutritional Status: BMI 25 -29 Overweight Nutritional Risks: None Diabetes: No  Lab Results  Component Value Date   HGBA1C 5.7 03/01/2023   HGBA1C 5.9 12/30/2021   HGBA1C 5.1 01/25/2020     How often do you need to have someone help you when you read  instructions, pamphlets, or other written materials from your doctor or pharmacy?: 3 - Sometimes (Wife assist)  Interpreter Needed?: No  Information entered by :: Rojelio Blush LPN   Activities of Daily Living     01/03/2024    2:56 PM  In your present state of health, do you have any difficulty performing the following activities:  Hearing? 0  Vision? 0  Difficulty concentrating or making decisions? 0  Walking or climbing stairs? 1  Comment Uses a Cane, Mudlogger or bathing? 0  Doing errands, shopping? Heritage manager and eating ? N  Using the Toilet? N  In the past six months, have you accidently leaked urine? N  Do you have problems with loss of bowel control? N  Managing your Medications? N  Managing your Finances? N  Housekeeping or managing your Housekeeping? N    Patient Care Team: [name], Edward, [name] as PCP - General (Internal Medicine) Bernie Lamar PARAS, MD as PCP - Cardiology (Cardiology)  I have updated your Care Teams any recent Medical Services you may have received from other providers in the past year.     Assessment:   This is a routine wellness examination for Jamill.  Hearing/Vision screen Hearing Screening - Comments:: Denies hearing difficulties   Vision Screening - Comments:: Wears rx glasses - up to date with routine eye exams with  [facility]   Goals Addressed               This Visit's Progress  Increase physical activity (pt-stated)        Remain active.       Depression Screen     01/03/2024    2:56 PM 11/10/2023    9:28 AM 09/07/2023   10:29 AM 12/30/2022    1:34 PM 07/09/2022    1:18 PM 12/22/2021    1:42 PM 11/25/2020    9:47 AM  PHQ 2/9 Scores  PHQ - 2 Score 0 3 1 1  0 0 1  PHQ- 9 Score  5 4 2        Fall Risk     01/03/2024    2:57 PM 12/01/2023   10:40 AM 11/29/2023   11:26 AM 11/24/2023   10:24 AM 11/22/2023   11:48 AM  Fall Risk   Falls in the past year? 1 1 1 1 1   Number falls in  past yr: 0 0 0 0 0  Injury with Fall? 0 1 1 1 1   Risk for fall due to : No Fall Risks History of fall(s);Impaired balance/gait History of fall(s);Impaired balance/gait History of fall(s);Impaired balance/gait History of fall(s);Impaired balance/gait  Follow up Falls evaluation completed Falls evaluation completed;Falls prevention discussed Falls evaluation completed;Falls prevention discussed Falls evaluation completed Falls evaluation completed;Falls prevention discussed    MEDICARE RISK AT HOME:  Medicare Risk at Home Any stairs in or around the home?: Yes If so, are there any without handrails?: No Home free of loose throw rugs in walkways, pet beds, electrical cords, etc?: Yes Adequate lighting in your home to reduce risk of falls?: Yes Life alert?: Yes Use of a cane, walker or w/c?: Yes Grab bars in the bathroom?: Yes Shower chair or bench in shower?: Yes Elevated toilet seat or a handicapped toilet?: Yes  TIMED UP AND GO:  Was the test performed?  No  Cognitive Function: 6CIT completed      06/22/2023    8:50 AM 06/15/2022    1:14 PM  Montreal Cognitive Assessment   Visuospatial/ Executive (0/5) 1 3  Naming (0/3) 3 3  Attention: Read list of digits (0/2) 1 2  Attention: Read list of letters (0/1) 1 1  Attention: Serial 7 subtraction starting at 100 (0/3) 2 1  Language: Repeat phrase (0/2) 1 1  Language : Fluency (0/1) 0 1  Abstraction (0/2) 2 1  Delayed Recall (0/5) 2 0  Orientation (0/6) 5 6  Total 18 19      01/03/2024    2:58 PM 12/30/2022    1:42 PM 12/22/2021    1:47 PM 11/25/2020    9:57 AM  6CIT Screen  What Year? 0 points 0 points 0 points 0 points  What month? 0 points 0 points 0 points 0 points  What time? 0 points 0 points 0 points 0 points  Count back from 20 0 points 0 points 0 points 0 points  Months in reverse 0 points 0 points 0 points 2 points  Repeat phrase 0 points 2 points 0 points 2 points  Total Score 0 points 2 points 0 points 4 points     Immunizations Immunization History  Administered Date(s) Administered   INFLUENZA, HIGH DOSE SEASONAL PF 11/15/2018, 01/11/2020, 12/27/2020, 12/24/2021, 12/29/2023   Influenza Split 01/09/2010, 02/09/2011   Influenza,inj,Quad PF,6+ Mos 01/01/2015   Influenza-Unspecified 01/30/2014, 02/02/2016, 12/15/2020   PFIZER(Purple Top)SARS-COV-2 Vaccination 04/27/2019, 05/18/2019, 01/14/2020, 07/22/2020, 12/15/2020, 12/24/2021   Pneumococcal Conjugate-13 11/23/2018   Pneumococcal Polysaccharide-23 08/12/2020   Respiratory Syncytial Virus Vaccine,Recomb Aduvanted(Arexvy) 12/17/2021   Tdap 02/18/2011  Screening Tests Health Maintenance  Topic Date Due   Zoster Vaccines- Shingrix (1 of 2) Never done   DTaP/Tdap/Td (2 - Td or Tdap) 02/17/2021   COVID-19 Vaccine (7 - 2025-26 season) 11/21/2023   Lung Cancer Screening  05/19/2024   Medicare Annual Wellness (AWV)  01/02/2025   Colonoscopy  07/10/2031   Pneumococcal Vaccine: 50+ Years  Completed   Influenza Vaccine  Completed   Hepatitis C Screening  Completed   Meningococcal B Vaccine  Aged Out    Health Maintenance Items Addressed:   Additional Screening:  Vision Screening: Recommended annual ophthalmology exams for early detection of glaucoma and other disorders of the eye. Is the patient up to date with their annual eye exam?  Yes  Who is the provider or what is the name of the office in which the patient attends annual eye exams? [facility]  Dental Screening: Recommended annual dental exams for proper oral hygiene  Community Resource Referral / Chronic Care Management: CRR required this visit?  No   CCM required this visit?  No   Plan:    I have personally reviewed and noted the following in the patient's chart:   Medical and social history Use of alcohol , tobacco or illicit drugs  Current medications and supplements including opioid prescriptions. Patient is currently taking opioid prescriptions. Information  provided to patient regarding non-opioid alternatives. Patient advised to discuss non-opioid treatment plan with their provider. Functional ability and status Nutritional status Physical activity Advanced directives List of other physicians Hospitalizations, surgeries, and ER visits in previous 12 months Vitals Screenings to include cognitive, depression, and falls Referrals and appointments  In addition, I have reviewed and discussed with patient certain preventive protocols, quality metrics, and best practice recommendations. A written personalized care plan for preventive services as well as general preventive health recommendations were provided to patient.   Rojelio LELON Blush, LPN   [id number]   After Visit Summary: (MyChart) Due to this being a telephonic visit, the after visit summary with patients personalized plan was offered to patient via MyChart   Notes: Nothing significant to report at this time.

## 2024-01-03 NOTE — Patient Instructions (Addendum)
 Robert Lang,  Thank you for taking the time for your Medicare Wellness Visit. I appreciate your continued commitment to your health goals. Please review the care plan we discussed, and feel free to reach out if I can assist you further.  Medicare recommends these wellness visits once per year to help you and your care team stay ahead of potential health issues. These visits are designed to focus on prevention, allowing your provider to concentrate on managing your acute and chronic conditions during your regular appointments.  Please note that Annual Wellness Visits do not include a physical exam. Some assessments may be limited, especially if the visit was conducted virtually. If needed, we may recommend a separate in-person follow-up with your provider.  Ongoing Care Seeing your primary care provider every 3 to 6 months helps us  monitor your health and provide consistent, personalized care.   Referrals If a referral was made during today's visit and you haven't received any updates within two weeks, please contact the referred provider directly to check on the status.  Recommended Screenings:  Health Maintenance  Topic Date Due   Zoster (Shingles) Vaccine (1 of 2) Never done   DTaP/Tdap/Td vaccine (2 - Td or Tdap) 02/17/2021   COVID-19 Vaccine (7 - 2025-26 season) 11/21/2023   Screening for Lung Cancer  05/19/2024   Medicare Annual Wellness Visit  01/02/2025   Colon Cancer Screening  07/10/2031   Pneumococcal Vaccine for age over 57  Completed   Flu Shot  Completed   Hepatitis C Screening  Completed   Meningitis B Vaccine  Aged Out   Opioid Pain Medicine Management Opioids are powerful medicines that are used to treat moderate to severe pain. When used for short periods of time, they can help you to: Sleep better. Do better in physical or occupational therapy. Feel better in the first few days after an injury. Recover from surgery. Opioids should be taken with the supervision  of a trained health care provider. They should be taken for the shortest period of time possible. This is because opioids can be addictive, and the longer you take opioids, the greater your risk of addiction. This addiction can also be called opioid use disorder. What are the risks? Using opioid pain medicines for longer than 3 days increases your risk of side effects. Side effects include: Constipation. Nausea and vomiting. Breathing difficulties (respiratory depression). Drowsiness. Confusion. Opioid use disorder. Itching. Taking opioid pain medicine for a long period of time can affect your ability to do daily tasks. It also puts you at risk for: Motor vehicle crashes. Depression. Suicide. Heart attack. Overdose, which can be life-threatening. What is a pain treatment plan? A pain treatment plan is an agreement between you and your health care provider. Pain is unique to each person, and treatments vary depending on your condition. To manage your pain, you and your health care provider need to work together. To help you do this: Discuss the goals of your treatment, including how much pain you might expect to have and how you will manage the pain. Review the risks and benefits of taking opioid medicines. Remember that a good treatment plan uses more than one approach and minimizes the chance of side effects. Be honest about the amount of medicines you take and about any drug or alcohol  use. Get pain medicine prescriptions from only one health care provider. Pain can be managed with many types of alternative treatments. Ask your health care provider to refer you to one or more  specialists who can help you manage pain through: Physical or occupational therapy. Counseling (cognitive behavioral therapy). Good nutrition. Biofeedback. Massage. Meditation. Non-opioid medicine. Following a gentle exercise program. How to use opioid pain medicine Taking medicine Take your pain medicine  exactly as told by your health care provider. Take it only when you need it. If your pain gets less severe, you may take less than your prescribed dose if your health care provider approves. If you are not having pain, do nottake pain medicine unless your health care provider tells you to take it. If your pain is severe, do nottry to treat it yourself by taking more pills than instructed on your prescription. Contact your health care provider for help. Write down the times when you take your pain medicine. It is easy to become confused while on pain medicine. Writing the time can help you avoid overdose. Take other over-the-counter or prescription medicines only as told by your health care provider. Keeping yourself and others safe  While you are taking opioid pain medicine: Do not drive, use machinery, or power tools. Do not sign legal documents. Do not drink alcohol . Do not take sleeping pills. Do not supervise children by yourself. Do not do activities that require climbing or being in high places. Do not go to a lake, river, ocean, spa, or swimming pool. Do not share your pain medicine with anyone. Keep pain medicine in a locked cabinet or in a secure area where pets and children cannot reach it. Stopping your use of opioids If you have been taking opioid medicine for more than a few weeks, you may need to slowly decrease (taper) how much you take until you stop completely. Tapering your use of opioids can decrease your risk of symptoms of withdrawal, such as: Pain and cramping in the abdomen. Nausea. Sweating. Sleepiness. Restlessness. Uncontrollable shaking (tremors). Cravings for the medicine. Do not attempt to taper your use of opioids on your own. Talk with your health care provider about how to do this. Your health care provider may prescribe a step-down schedule based on how much medicine you are taking and how long you have been taking it. Getting rid of leftover pills Do not  save any leftover pills. Get rid of leftover pills safely by: Taking the medicine to a prescription take-back program. This is usually offered by the county or law enforcement. Bringing them to a pharmacy that has a drug disposal container. Flushing them down the toilet. Check the label or package insert of your medicine to see whether this is safe to do. Throwing them out in the trash. Check the label or package insert of your medicine to see whether this is safe to do. If it is safe to throw it out, remove the medicine from the original container, put it into a sealable bag or container, and mix it with used coffee grounds, food scraps, dirt, or cat litter before putting it in the trash. Follow these instructions at home: Activity Do exercises as told by your health care provider. Avoid activities that make your pain worse. Return to your normal activities as told by your health care provider. Ask your health care provider what activities are safe for you. General instructions You may need to take these actions to prevent or treat constipation: Drink enough fluid to keep your urine pale yellow. Take over-the-counter or prescription medicines. Eat foods that are high in fiber, such as beans, whole grains, and fresh fruits and vegetables. Limit foods that are high  in fat and processed sugars, such as fried or sweet foods. Keep all follow-up visits. This is important. Where to find support If you have been taking opioids for a long time, you may benefit from receiving support for quitting from a local support group or counselor. Ask your health care provider for a referral to these resources in your area. Where to find more information Centers for Disease Control and Prevention (CDC): FootballExhibition.com.br [place] Food and Drug Administration (FDA): PumpkinSearch.com.ee Get help right away if: You may have taken too much of an opioid (overdosed). Common symptoms of an overdose: Your breathing is slower or more  shallow than normal. You have a very slow heartbeat (pulse). You have slurred speech. You have nausea and vomiting. Your pupils become very small. You have other potential symptoms: You are very confused. You faint or feel like you will faint. You have cold, clammy skin. You have blue lips or fingernails. You have thoughts of harming yourself or harming others. These symptoms may represent a serious problem that is an emergency. Do not wait to see if the symptoms will go away. Get medical help right away. Call your local emergency services (911 in the [place] Do not drive yourself to the hospital.  If you ever feel like you may hurt yourself or others, or have thoughts about taking your own life, get help right away. Go to your nearest emergency department or: Call your local emergency services (911 in the [place] Call the [facility] (912-763-0457 in the [place] Call a suicide crisis helpline, such as the National Suicide Prevention Lifeline at 413-566-7948 or 988 in the [place] This is open 24 hours a day in the [place] If you're a Veteran: Call 988 and press 1. This is open 24 hours a day. Text the [employer] at 843-492-9709. Summary Opioid medicines can help you manage moderate to severe pain for a short period of time. A pain treatment plan is an agreement between you and your health care provider. Discuss the goals of your treatment, including how much pain you might expect to have and how you will manage the pain. If you think that you or someone else may have taken too much of an opioid, get medical help right away. This information is not intended to replace advice given to you by your health care provider. Make sure you discuss any questions you have with your health care provider. Document Revised: 12/13/2022 Document Reviewed: 06/18/2020 Elsevier Patient Education  2024 [employer]    01/03/2024    2:58 PM  Advanced Directives  Does Patient Have a  Medical Advance Directive? Yes  Type of Estate agent of [place];Living will  Does patient want to make changes to medical advance directive? No - Patient declined  Copy of Healthcare Power of Attorney in Chart? Yes - validated most recent copy scanned in chart (See row information)   Advance Care Planning is important because it: Ensures you receive medical care that aligns with your values, goals, and preferences. Provides guidance to your family and loved ones, reducing the emotional burden of decision-making during critical moments.  Vision: Annual vision screenings are recommended for early detection of glaucoma, cataracts, and diabetic retinopathy. These exams can also reveal signs of chronic conditions such as diabetes and high blood pressure.  Dental: Annual dental screenings help detect early signs of oral cancer, gum disease, and other conditions linked to overall health, including heart disease and diabetes.  Please see the attached  documents for additional preventive care recommendations.

## 2024-01-04 ENCOUNTER — Other Ambulatory Visit (HOSPITAL_BASED_OUTPATIENT_CLINIC_OR_DEPARTMENT_OTHER): Payer: Self-pay

## 2024-01-07 DIAGNOSIS — I10 Essential (primary) hypertension: Secondary | ICD-10-CM | POA: Diagnosis not present

## 2024-01-07 DIAGNOSIS — I2693 Single subsegmental pulmonary embolism without acute cor pulmonale: Secondary | ICD-10-CM | POA: Diagnosis not present

## 2024-01-07 DIAGNOSIS — A419 Sepsis, unspecified organism: Secondary | ICD-10-CM | POA: Diagnosis not present

## 2024-01-09 DIAGNOSIS — A419 Sepsis, unspecified organism: Secondary | ICD-10-CM | POA: Diagnosis not present

## 2024-01-09 DIAGNOSIS — I2693 Single subsegmental pulmonary embolism without acute cor pulmonale: Secondary | ICD-10-CM | POA: Diagnosis not present

## 2024-01-09 DIAGNOSIS — Z86711 Personal history of pulmonary embolism: Secondary | ICD-10-CM | POA: Diagnosis not present

## 2024-01-09 DIAGNOSIS — I10 Essential (primary) hypertension: Secondary | ICD-10-CM | POA: Diagnosis not present

## 2024-01-11 ENCOUNTER — Other Ambulatory Visit: Payer: Self-pay | Admitting: Pharmacist

## 2024-01-11 ENCOUNTER — Ambulatory Visit: Admitting: Neurology

## 2024-01-11 ENCOUNTER — Encounter: Payer: Self-pay | Admitting: Neurology

## 2024-01-11 ENCOUNTER — Telehealth: Payer: Self-pay | Admitting: Neurology

## 2024-01-11 VITALS — BP 101/67 | HR 102 | Wt 205.0 lb

## 2024-01-11 DIAGNOSIS — J84112 Idiopathic pulmonary fibrosis: Secondary | ICD-10-CM

## 2024-01-11 DIAGNOSIS — G4700 Insomnia, unspecified: Secondary | ICD-10-CM

## 2024-01-11 DIAGNOSIS — G629 Polyneuropathy, unspecified: Secondary | ICD-10-CM | POA: Diagnosis not present

## 2024-01-11 DIAGNOSIS — G3184 Mild cognitive impairment, so stated: Secondary | ICD-10-CM | POA: Diagnosis not present

## 2024-01-11 DIAGNOSIS — G2581 Restless legs syndrome: Secondary | ICD-10-CM

## 2024-01-11 MED ORDER — TRAMADOL HCL 50 MG PO TABS: 50.0000 mg | ORAL_TABLET | Freq: Two times a day (BID) | ORAL | 5 refills | Status: AC

## 2024-01-11 MED ORDER — LIDOCAINE-PRILOCAINE 2.5-2.5 % EX CREA: 1.0000 | TOPICAL_CREAM | CUTANEOUS | 0 refills | Status: DC | PRN

## 2024-01-11 MED ORDER — DULOXETINE HCL 30 MG PO CPEP
30.0000 mg | ORAL_CAPSULE | Freq: Every day | ORAL | 6 refills | Status: AC
Start: 2024-01-11 — End: ?

## 2024-01-11 NOTE — Progress Notes (Signed)
 Pharmacy Quality Measure Review  Statin Therapy for Patients with Cardiovascular Disease [facility])  Patient is on list from [facility] that he has not been taking a statin, however Dr Krasowski has prescribed atorvastatin  80mg .  I called his pharmacy and they verified patient filled atorvastatin  80mg  for 90 day supply on the following dates 02/03/205, 07/27/2023 and 10/27/2023. Was run thru [facility] per [employer] and he picked up Rx each time.   Unsure why patient is showing as failing SPC but he appears to be taking / filling atorvastatin . Will forward message to [employer] / [facility] team to communicate to [facility].     Madelin Ray, PharmD Clinical Pharmacist [facility]  Population Health [phone]

## 2024-01-11 NOTE — Progress Notes (Signed)
 [facility]  PATIENT: Robert Lang DOB: [birth year]  REQUESTING CLINICIAN: [name], [place], [name] HISTORY FROM: Patient and spouse  REASON FOR VISIT: Recurrent syncope    HISTORICAL  CHIEF COMPLAINT:  Chief Complaint  Patient presents with   RM 13    Patient is here alone for memory - wife was not able to come with him today.    Other    Having issues with feet, legs, and sleep     INTERVAL HISTORY 01/11/2024 Delmon presents today for follow-up, he is alone.  At last visit we started him on tramadol  which helped his neuropathic pain but feels the tramadol  is not enough.  He still struggling with severe pain at night sometimes disturbing his sleep.  He tells me that he has lost weight, has done some pulmonary rehab, his breathing status is stable.  He would like an increase in his medication to control his pain. He is still on supplemental oxygen .    INTERVAL HISTORY 06/22/2023 Patient presents today for follow-up, he is accompanied by wife.  Last visit was 6 months ago.  Since then, he tells me that his symptoms are the same, it is controlled with pregabalin  during the daytime but at night they do get severe to the point that sometimes, he has to get up and walk around, or he will go to sleep in the recliner. On top of that, he feels like his memory is getting worse, he is easily distracted, more forgetful.  Rely on wife more for reminders.  Tells me that he permanently uses the supplemental O2 now.  Starts to exercise but he is not consistent with walking daily.   INTERVAL HISTORY 10/27/2022:  Patient presents today for follow-up for follow-up, he is accompanied by wife.  Last visit was in March at that time we restarted him on pregabalin  150 mg twice daily.  He reports with the pregabalin  150 mg twice daily his neuropathic pain is well-controlled.  Unfortunately in the past month he has been in and out of the hospital due to pulmonary embolism, gallbladder  disease, which was infected, developed sepsis.  Now he has a drain.  He is also pending surgery scheduled in September.  He is currently using supplemental O2.  He has a follow-up set up with pulmonology soon.  Again in term of the neuropathy, he reports on occasion he will have additional burning pain but overall it is much better than previously.   INTERVAL HISTORY 06/15/2022:  Patient presents today for follow-up, last visit was in December.  At that time we started him on lidocaine  cream since the Pregabalin  had caused weight gain which affect his respiratory status.  He reports that lidocaine  has not been helpful.  He is in extreme pain.  He does take Ambien  at night for sleep and some night he has to double the dose because he cannot sleep due to pain.  Again the pain is extreme worse on the left.  He reports that he has made some change to his diet, stop eating ice cream, decreasing his food intake and he has lost 10 to 15 pounds.  He would like to go back on the Lyrica  since that is the only medication that has been helpful. He is not interested in a spinal stimulator for neuropathic pain.     INTERVAL HISTORY 03/01/2022:  Patient presents today for follow-up, at last visit we had planned to start him on pregabalin .  Initially he was doing well in terms of  the neuropathic pain but has developed weight gain which in turn compromised his respiratory effort.  After discussion with his pulmonologist and with patient, we decided to discontinue Lyrica  and start patient on amitriptyline .  He reports that amitriptyline  is not helpful, it is not helpful with the neuropathy pain at night which keeps him from sleep. Last night, he had to take 4 Tylenol  in order to control the pain and obtain some rest.    INTERVAL HISTORY 11/18/2021:  Patient presents today for follow-up, since last visit on 6/27, he denies any additional falls.  His current complaint right now is bilateral feet numbness, and pain.  He  reports being diagnosed with neuropathy for many years but lately he has been experiencing burning pain in both feet.  The pain will keep him up at night.  He is on gabapentin  400 mg at nighttime but sometimes he would take extra dose in the middle of the night if the pain wakes him up.  Denies any involvement of the legs, but reports at times he will have sharp shooting pain that starts from the left-sided back all the way down to the toes.   HISTORY OF PRESENT ILLNESS:  This is a 73 year old male with multiple medical conditions including hypertension, hyperlipidemia, atrial fibrillation, COPD, pulmonary fibrosis who is presenting after being admitted to the hospital after multiple syncopal episodes.  Patient reports in end of May he got tested positive for COVID, he was having symptoms and was started on Plaxovid which he completed on June 4.  During this time while he was battling COVID, he did have generalized weakness, he actually had a fall and had difficulty getting of the floor, and on June 6 while being out with his wife had a syncopal episode while in the car.  Prior to the syncope he did complain of dizziness and feeling weak. He presented to [facility], admitted from the 6 to the 7 for syncope, initial work-up including MRI Brain was negative for any etiology of the syncope. On June 9 he presented again to [facility] hospital for 2 additional syncope, prior to the episode he reported feeling dizzy.  In the ED he had 1 syncopal episode which was witnessed and there is report of left leg shaking with the syncope. There was no associated urinary incontinence, no tongue biting and no postictal fusion Again patient was admitted, work-up was unrevealing, he did have overnight EEG which was negative.  Again he was diagnosed with convulsive syncope likely due to dehydration and related to recent COVID infection. Since being discharged from the hospital, he reports feeling better, doing pretty good, he feels  weak, feel drained but again no additional syncopal episode.  He is scheduled to see his pulmonologist on 5 July    OTHER MEDICAL CONDITIONS: Pulmonary fibrosis, depression/anxiety, hypertension, hyperlipidemia, atrial fibrillation, COPD, obstructive sleep apnea on CPAP   REVIEW OF SYSTEMS: Full 14 system review of systems performed and negative with exception of: as noted in the HPI   ALLERGIES: Allergies  Allergen Reactions   Naproxen Other (See Comments)    (Naprosyn *ANALGESICS - ANTI-INFLAMMATORY*) Nausea, Abdominal pain   Rapaflo [Silodosin] Other (See Comments)    Low blood pressure    HOME MEDICATIONS: Outpatient Medications Prior to Visit  Medication Sig Dispense Refill   acetaminophen  (TYLENOL ) 500 MG tablet Take 1 tablet (500 mg total) by mouth every 8 (eight) hours as needed for moderate pain. 100 tablet 0   atorvastatin  (LIPITOR ) 80 MG tablet Take  1 tablet (80 mg total) by mouth daily. 90 tablet 3   buPROPion  (WELLBUTRIN  XL) 150 MG 24 hr tablet Take 1 tablet (150 mg total) by mouth daily. 30 tablet 3   busPIRone  (BUSPAR ) 15 MG tablet Take 1 tablet (15 mg total) by mouth 3 (three) times daily. 90 tablet 3   Cholecalciferol (VITAMIN D3) 50 MCG (2000 UT) TABS Take 4,000 Units by mouth 2 (two) times daily.     docusate sodium  (COLACE) 100 MG capsule Take 100 mg by mouth daily.     ELIQUIS  5 MG TABS tablet TAKE 1 TABLET(5 MG) BY MOUTH TWICE DAILY 60 tablet 5   fexofenadine (ALLEGRA) 180 MG tablet Take 180 mg by mouth at bedtime.      finasteride  (PROSCAR ) 5 MG tablet Take 1 tablet (5 mg total) by mouth daily. 90 tablet 1   lamoTRIgine  (LAMICTAL ) 100 MG tablet Take 1 tablet (100 mg total) by mouth daily. 90 tablet 1   LORazepam  (ATIVAN ) 0.5 MG tablet TAKE 1 TABLET BY MOUTH ONLY FOR SEVERE ANXIETY OR AGITATION 30 tablet 3   Melatonin 10 MG TABS Take 10 mg by mouth at bedtime.     MUCINEX  600 MG 12 hr tablet TAKE 2 TABLETS BY MOUTH TWICE DAILY 120 tablet 0   nitroGLYCERIN   (NITROSTAT ) 0.4 MG SL tablet Place 1 tablet (0.4 mg total) under the tongue every 5 (five) minutes as needed for chest pain. 25 tablet 3   ondansetron  (ZOFRAN ) 4 MG tablet Take 1 tablet (4 mg total) by mouth every 8 (eight) hours as needed for nausea or vomiting. 20 tablet 0   OXYGEN  Inhale 2 L into the lungs daily.     pantoprazole  (PROTONIX ) 40 MG tablet Take 1 tablet (40 mg total) by mouth daily. 30 tablet 6   Pirfenidone  (ESBRIET ) 801 MG TABS Take 1 tablet (801 mg total) by mouth 3 (three) times daily. 270 tablet 1   polyethylene glycol powder (GLYCOLAX /MIRALAX ) 17 GM/SCOOP powder Take 17 g by mouth 2 (two) times daily.     pregabalin  (LYRICA ) 150 MG capsule TAKE 1 CAPSULE(150 MG) BY MOUTH TWICE DAILY 60 capsule 5   ranolazine  (RANEXA ) 1000 MG SR tablet Take 1 tablet (1,000 mg total) by mouth 2 (two) times daily. 180 tablet 2   traZODone  (DESYREL ) 50 MG tablet TAKE 1 TABLET(50 MG) BY MOUTH AT BEDTIME 90 tablet 0   vitamin B-12 (CYANOCOBALAMIN ) 1000 MCG tablet Take 1,000 mcg by mouth daily.     vitamin E 1000 UNIT capsule Take 1,000 Units by mouth 2 (two) times daily at 8 am and 10 pm.     zolpidem  (AMBIEN ) 5 MG tablet TAKE 1 TABLET(5 MG) BY MOUTH AT BEDTIME 30 tablet 3   traMADol  (ULTRAM ) 50 MG tablet Take 1 tablet (50 mg total) by mouth at bedtime. 30 tablet 5   famotidine  (PEPCID ) 40 MG tablet Take 1 tablet (40 mg total) by mouth daily. (Patient not taking: Reported on 01/11/2024) 90 tablet 3   levalbuterol  (XOPENEX ) 1.25 MG/0.5ML nebulizer solution Take 1.25 mg by nebulization every 4 (four) hours as needed for wheezing or shortness of breath. (Patient not taking: Reported on 01/11/2024) 1 each 12   Multiple Vitamins-Minerals (PRESERVISION AREDS PO) Take 1 capsule by mouth in the morning and at bedtime. (Patient not taking: Reported on 01/11/2024)     sertraline  (ZOLOFT ) 100 MG tablet TAKE 2 TABLETS(200 MG) BY MOUTH AT BEDTIME (Patient not taking: Reported on 01/11/2024) 180 tablet 0    Facility-Administered  Medications Prior to Visit  Medication Dose Route Frequency Provider Last Rate Last Admin   regadenoson  (LEXISCAN ) injection SOLN 0.4 mg  0.4 mg Intravenous Once Tobb, Kardie, DO       technetium tetrofosmin  (TC-MYOVIEW ) injection 31.7 millicurie  31.7 millicurie Intravenous Once PRN Tobb, Kardie, DO        PAST MEDICAL HISTORY: Past Medical History:  Diagnosis Date   Acute ST elevation myocardial infarction (STEMI) of inferolateral wall (HCC) 01/10/2019   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 M75.00)   Allergic rhinitis due to pollen 09/03/2013   J30.1)  Formatting of this note might be different from the original. J30.1)   Allergy    Anticoagulated 07/01/2016   Anxiety    Arthritis    Atrial fibrillation (HCC)    Atrial flutter (HCC) 06/26/2015   CAD (coronary artery disease)    Chest pain 06/26/2015   COPD (chronic obstructive pulmonary disease) (HCC)    Coronary artery disease 07/06/2018   Cardiac catheterization 2017 showing 90% small diagonal branch disease   DDD (degenerative disc disease), cervical 09/03/2013   M50.90)  Formatting of this note might be different from the original. M50.90)   Depression    Dizziness 10/28/2017   Dyslipidemia, goal LDL below 70 07/06/2018   Dyspnea on exertion 10/20/2018   Dysrhythmia    Essential hypertension 07/06/2018   ETOH abuse 01/11/2019   6 pack of beer per day   Falls 10/28/2017   GERD (gastroesophageal reflux disease)    H/O amiodarone therapy 07/01/2016   Heart palpitations 01/27/2017   History of colon polyps    History of kidney stones    Hyperlipidemia    Hypertension    IPF (idiopathic pulmonary fibrosis) (HCC)    Neck pain 09/03/2013   Neuropathy 07/06/2018   Obstructive sleep apnea 08/05/2015   PLMD (periodic limb movement disorder) 11/04/2015   Polycythemia, 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 STORY: Due to alcohol / tobacco   Pure hypercholesterolemia 09/03/2013   E78.0)  Formatting of this note might be different from the original. 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/06/2018   2017    PAST SURGICAL HISTORY: Past Surgical History:  Procedure Laterality Date   ATRIAL FIBRILLATION ABLATION  10/2015   BACK SURGERY     CARDIOVERSION  2017   CATARACT EXTRACTION Bilateral 2017   March and April 2017   CHOLECYSTECTOMY N/A 12/21/2022   Procedure: LAPAROSCOPIC CHOLECYSTECTOMY AND REMOVAL OF BILIARY DRAIN;  Surgeon: Stevie Herlene Righter, MD;  Location: [facility];  Service: General;  Laterality: N/A;   COLONOSCOPY  2018   CORONARY STENT PLACEMENT  2012   CORONARY/GRAFT ACUTE MI REVASCULARIZATION N/A 01/10/2019   Procedure: CORONARY/GRAFT ACUTE MI REVASCULARIZATION;  Surgeon: Anner Alm ORN, MD;  Location: [facility] INVASIVE CV LAB;  Service: Cardiovascular;  Laterality: N/A;   INGUINAL HERNIA REPAIR     over 20 years ago   IR CHOLANGIOGRAM EXISTING TUBE  09/16/2022   IR EXCHANGE BILIARY DRAIN  10/06/2022   IR PERC CHOLECYSTOSTOMY  08/30/2022   LAPAROSCOPIC APPENDECTOMY N/A 01/17/2021   Procedure: APPENDECTOMY LAPAROSCOPIC;  Surgeon: Lyndel Deward PARAS, MD;  Location: [facility] OR;  Service: General;  Laterality: N/A;   LEFT HEART CATH AND CORONARY ANGIOGRAPHY N/A 01/10/2019   Procedure: LEFT HEART CATH AND  CORONARY ANGIOGRAPHY;  Surgeon: Anner Alm ORN, MD;  Location: [facility] INVASIVE CV LAB;  Service: Cardiovascular;  Laterality: N/A;   LUMBAR LAMINECTOMY Bilateral 04/05/2016   L2-L5    VENTRAL HERNIA REPAIR  2018    FAMILY HISTORY: Family History  Problem Relation Age of Onset   Anxiety disorder Mother    Alcohol  abuse Father    Colon cancer Father    Depression Brother    Alcohol  abuse Brother    Throat cancer Brother     SOCIAL HISTORY: Social History    Socioeconomic History   Marital status: Married    Spouse name: Not on file   Number of children: Not on file   Years of education: Not on file   Highest education level: 12th grade  Occupational History   Not on file  Tobacco Use   Smoking status: Former    Current packs/day: 0.00    Average packs/day: 1 pack/day for 30.0 years (30.0 ttl pk-yrs)    Types: Cigarettes    Start date: 01/09/1989    Quit date: 01/10/2019    Years since quitting: 5.0   Smokeless tobacco: Former  Building services engineer status: Never Used  Substance and Sexual Activity   Alcohol  use: Not Currently    Comment: 8oz of wine a day   Drug use: Never   Sexual activity: Not on file  Other Topics Concern   Not on file  Social History Narrative   Lives with wife   Social Drivers of Health   Financial Resource Strain: Low Risk  (01/03/2024)   Overall Financial Resource Strain (CARDIA)    Difficulty of Paying Living Expenses: Not hard at all  Food Insecurity: No Food Insecurity (01/03/2024)   Hunger Vital Sign    Worried About Running Out of Food in the Last Year: Never true    Ran Out of Food in the Last Year: Never true  Transportation Needs: No Transportation Needs (01/03/2024)   PRAPARE - Administrator, Civil Service (Medical): No    Lack of Transportation (Non-Medical): No  Physical Activity: Insufficiently Active (01/03/2024)   Exercise Vital Sign    Days of Exercise per Week: 3 days    Minutes of Exercise per Session: 20 min  Stress: No Stress Concern Present (01/03/2024)   [employer] of Occupational Health - Occupational Stress Questionnaire    Feeling of Stress: Not at all  Social Connections: Socially Integrated (01/03/2024)   Social Connection and Isolation Panel    Frequency of Communication with Friends and Family: More than three times a week    Frequency of Social Gatherings with Friends and Family: More than three times a week    Attends Religious Services: More  than 4 times per year    Active Member of [employer] or Organizations: Yes    Attends Banker Meetings: More than 4 times per year    Marital Status: Married  Catering manager Violence: Not At Risk (01/03/2024)   Humiliation, Afraid, Rape, and Kick questionnaire    Fear of Current or Ex-Partner: No    Emotionally Abused: No    Physically Abused: No    Sexually Abused: No    PHYSICAL EXAM  GENERAL EXAM/CONSTITUTIONAL: Vitals:  Vitals:   01/11/24 1052  BP: 101/67  Pulse: (!) 102  SpO2: 97%  Weight: 205 lb (93 kg)    Body mass index is 28.59 kg/m. Wt Readings from Last 3 Encounters:  01/11/24 205 lb (93 kg)  01/03/24 205 lb (93 kg)  12/29/23 215 lb (97.5 kg)   Patient is in no distress; well developed, nourished and groomed; neck is supple, 2 L supplemental O2    MUSCULOSKELETAL: Gait, strength, tone, movements noted in Neurologic exam below  NEUROLOGIC: MENTAL STATUS:      No data to display              01/11/2024   10:56 AM 06/22/2023    8:50 AM 06/15/2022    1:14 PM  Montreal Cognitive Assessment   Visuospatial/ Executive (0/5) 4 1 3   Naming (0/3) 2 3 3   Attention: Read list of digits (0/2) 2 1 2   Attention: Read list of letters (0/1) 1 1 1   Attention: Serial 7 subtraction starting at 100 (0/3) 2 2 1   Language: Repeat phrase (0/2) 2 1 1   Language : Fluency (0/1) 1 0 1  Abstraction (0/2) 0 2 1  Delayed Recall (0/5) 3 2 0  Orientation (0/6) 5 5 6   Total 22 18 19     awake, alert, oriented to person, place and time recent and remote memory intact normal attention and concentration language fluent, comprehension intact, naming intact fund of knowledge appropriate  CRANIAL NERVE:  2nd, 3rd, 4th, 6th - visual fields full to confrontation, extraocular muscles intact, no nystagmus 5th - facial sensation symmetric 7th - facial strength symmetric 8th - hearing intact 9th - palate elevates symmetrically, uvula midline 11th - shoulder shrug  symmetric 12th - tongue protrusion midline  MOTOR:  normal bulk and tone, full strength in the BUE, BLE  SENSORY:  Decrease light touch, pinprick and vibration all the way to ankle bilaterally. He also has absent proprioception.   COORDINATION:  finger-nose-finger, fine finger movements normal   GAIT   Ambulates with a cane    DIAGNOSTIC DATA (LABS, IMAGING, TESTING) - I reviewed patient records, labs, notes, testing and imaging myself where available.  Lab Results  Component Value Date   WBC 8.5 05/30/2023   HGB 14.8 05/30/2023   HCT 45.4 05/30/2023   MCV 94.5 05/30/2023   PLT 226.0 05/30/2023      Component Value Date/Time   NA 136 05/30/2023 1044   NA 137 03/05/2020 1343   K 4.4 05/30/2023 1044   CL 102 05/30/2023 1044   CO2 26 05/30/2023 1044   GLUCOSE 119 (H) 05/30/2023 1044   BUN 13 05/30/2023 1044   BUN 14 03/05/2020 1343   CREATININE 0.79 05/30/2023 1044   CREATININE 0.85 05/16/2023 1005   CALCIUM  9.5 05/30/2023 1044   PROT 7.0 12/29/2023 1512   PROT 6.9 04/20/2019 0807   ALBUMIN 4.2 12/29/2023 1512   ALBUMIN 4.3 04/20/2019 0807   AST 18 12/29/2023 1512   ALT 19 12/29/2023 1512   ALKPHOS 71 12/29/2023 1512   BILITOT 0.5 12/29/2023 1512   BILITOT 0.5 04/20/2019 0807   GFRNONAA >60 05/09/2023 1140   GFRAA 81 03/05/2020 1343   Lab Results  Component Value Date   CHOL 158 03/22/2023   HDL 26 (L) 03/22/2023   LDLCALC 73 03/22/2023   LDLDIRECT 80.0 12/30/2021   TRIG 366 (H) 03/22/2023   CHOLHDL 6.1 (H) 03/22/2023   Lab Results  Component Value Date   HGBA1C 5.7 03/01/2023   Lab Results  Component Value Date   VITAMINB12 832 09/02/2021   Lab Results  Component Value Date   TSH 1.232 01/16/2021    Head CT 08/26/21:  1. No CT evidence for acute intracranial abnormality. 2. Atrophy and  chronic small vessel ischemic changes of the white matter  MRI Brain 08/25/21 1. No acute intracranial abnormality.  2. Findings of chronic small vessel  ischemia and generalized cerebral volume loss  Lumbar spine MRI 12/11/21 1.  At L2-L3, there is severe loss of disc height and other degenerative change causing moderately severe right lateral recess stenosis and moderate right foraminal narrowing.  There is potential for right L3 nerve root compression.  No spinal stenosis. 2.  At L3-L4, there is severe loss of disc height and other degenerative change causing moderately severe left lateral recess stenosis with potential for left L4 nerve root compression. 3.  At L4-L5, there are degenerative changes including severe facet hypertrophy causing mild spinal stenosis in the transverse diameter and moderately severe left foraminal and left lateral recess stenosis.  There is potential for left L4 or L5 nerve root compression. 4.  Milder degenerative changes at L1-L2 and L5-S1 do not lead to spinal stenosis or nerve root compression.   LTM EEG  This study is within normal limits. 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 year old male with multiple medical conditions including hypertension, hyperlipidemia, atrial fibrillation, COPD, pulmonary fibrosis who is presenting for follow up for his neuropathic pain and cognitive impairment.  His neuropathic pain is still present interfering with his daily activity despite being on pregabalin  150 mg twice daily and tramadol  50 mg nightly.  Plan will be to continue with pregabalin  150 mg twice daily, due to his respiratory status I would not increase the dose.  However I will increase his tramadol  to 50 mg twice daily, add duloxetine 30 mg daily and add EMLA cream to use at night.  I will also refer him for a a one-time consultation with pain management to see if they can optimize his medications.  I will see him in 6 months for follow-up  or sooner if worse.    1. Peripheral polyneuropathy   2. Small fiber neuropathy   3. RLS (restless legs syndrome)   4. Mild cognitive impairment   5. IPF (idiopathic pulmonary fibrosis) (HCC)   6. Insomnia, unspecified type      Patient Instructions  Increase Tramadol  to 50 mg twice daily  Continue with Pregabalin  150 mg twice daily  Continue with Trazodone  for sleep  Start Duloxetine 30 mg daily for pain  Use EMLA cream at night  Referral for pain management consultation  Return in 6 months or sooner if worse   Orders Placed This Encounter  Procedures   Ambulatory referral to Pain Clinic    Meds ordered this encounter  Medications   traMADol  (ULTRAM ) 50 MG tablet    Sig: Take 1 tablet (50 mg total) by mouth 2 (two) times daily.    Dispense:  60 tablet    Refill:  5   lidocaine -prilocaine (EMLA) cream    Sig: Apply 1 Application topically as needed.    Dispense:  30 g    Refill:  0   DULoxetine (CYMBALTA) 30 MG capsule    Sig: Take 1 capsule (30 mg total) by mouth daily.    Dispense:  30 capsule    Refill:  6    Return in about 6 months (around 07/11/2024).   Pastor Falling, MD 01/11/2024, 12:22 PM  [facility] [address], [address] [place], [place] [address] [phone]

## 2024-01-11 NOTE — Telephone Encounter (Signed)
 Referral for Pain Clinic faxed to [facility]: [facility]: [facility] Phone:[phone] Fax: [phone]

## 2024-01-11 NOTE — Patient Instructions (Addendum)
 Increase Tramadol  to 50 mg twice daily  Continue with Pregabalin  150 mg twice daily  Continue with Trazodone  for sleep  Start Duloxetine 30 mg daily for pain  Use EMLA cream at night  Referral for pain management consultation  Return in 6 months or sooner if worse

## 2024-01-12 ENCOUNTER — Ambulatory Visit: Admitting: Medical

## 2024-01-12 VITALS — BP 124/70 | HR 88 | Resp 15 | Ht 71.0 in | Wt 212.0 lb

## 2024-01-12 DIAGNOSIS — J84112 Idiopathic pulmonary fibrosis: Secondary | ICD-10-CM | POA: Diagnosis not present

## 2024-01-12 DIAGNOSIS — L989 Disorder of the skin and subcutaneous tissue, unspecified: Secondary | ICD-10-CM

## 2024-01-12 DIAGNOSIS — F1010 Alcohol abuse, uncomplicated: Secondary | ICD-10-CM

## 2024-01-12 DIAGNOSIS — G629 Polyneuropathy, unspecified: Secondary | ICD-10-CM | POA: Diagnosis not present

## 2024-01-12 DIAGNOSIS — G8929 Other chronic pain: Secondary | ICD-10-CM

## 2024-01-12 DIAGNOSIS — M545 Low back pain, unspecified: Secondary | ICD-10-CM

## 2024-01-12 NOTE — Progress Notes (Signed)
 Subjective:    Patient ID: Robert Lang, male    DOB: 06/25/1950, 73 y.o.   MRN: [id number]  HPI  Robert Lang is a 73 year old male who presents with multiple hyperpigmented lesions on his nose, one of which has started bleeding.  He has noticed a brown spot on his nose that has been present for five to six months. Recently, he discovered another bump in the same area after taking a shower. However, a specific brown spot has started bleeding intermittently over the past two weeks, particularly after showering.  He mentions a family history of skin cancer, noting that his brother had a problem with skin cancer. Additionally, he recalls that his neighbor had a skin cancer on his nose that bled.  He is currently under the care of a pulmonologist for interstitial lung disease (ILD) and has had a high-resolution CT scan in February. He is on medication for ILD.  He is also being treated for neuropathy, with a prescription for lidocaine  cream for his feet. He is scheduled to see a pain doctor next Tuesday for neuropathy management and chronic low back pain. He is currently taking tramadol , Lyrica , and Cymbalta for nerve pain, which may be related to issues in his lower back.  He has received a flu shot and a COVID vaccine recently, with the flu shot administered one week prior to the COVID vaccine.    Review of Systems  Constitutional:  Negative for chills and fatigue.  HENT:  Negative for congestion, ear pain, hearing loss and postnasal drip.   Respiratory:  Negative for chest tightness, shortness of breath and wheezing.        On oxygen . Pt is stable though baseline short of breath due to ILD.  Cardiovascular:  Negative for palpitations.  Gastrointestinal:  Negative for abdominal pain.  Genitourinary:  Negative for dysuria and frequency.  Musculoskeletal:  Negative for back pain.  Neurological:  Negative for dizziness, speech difficulty and light-headedness.  Hematological:   Negative for adenopathy.  Psychiatric/Behavioral:  Negative for behavioral problems and decreased concentration.     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 M75.00)   Allergic rhinitis due to pollen 09/03/2013   J30.1)  Formatting of this note might be different from the original. J30.1)   Allergy    Anticoagulated 07/01/2016   Anxiety    Arthritis    Atrial fibrillation (HCC)    Atrial flutter (HCC) 06/26/2015   CAD (coronary artery disease)    Chest pain 06/26/2015   COPD (chronic obstructive pulmonary disease) (HCC)    Coronary artery disease 07/06/2018   Cardiac catheterization 2017 showing 90% small diagonal branch disease   DDD (degenerative disc disease), cervical 09/03/2013   M50.90)  Formatting of this note might be different from the original. M50.90)   Depression    Dizziness 10/28/2017   Dyslipidemia, goal LDL below 70 07/06/2018   Dyspnea on exertion 10/20/2018   Dysrhythmia    Essential hypertension 07/06/2018   ETOH abuse 01/11/2019   6 pack of beer per day   Falls 10/28/2017   GERD (gastroesophageal reflux disease)    H/O amiodarone therapy 07/01/2016   Heart palpitations 01/27/2017   History of colon polyps    History of kidney stones    Hyperlipidemia    Hypertension  IPF (idiopathic pulmonary fibrosis) (HCC)    Neck pain 09/03/2013   Neuropathy 07/06/2018   Obstructive sleep apnea 08/05/2015   PLMD (periodic limb movement disorder) 11/04/2015   Polycythemia, 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 STORY: Due to alcohol / tobacco   Pure hypercholesterolemia 09/03/2013   E78.0)  Formatting of this note might be different from the original. E78.0)   Screening for prostate  cancer 09/03/2013   Status post ablation of atrial flutter 07/06/2018   2017     Social History   Socioeconomic History   Marital status: Married    Spouse name: Not on file   Number of children: Not on file   Years of education: Not on file   Highest education level: 12th grade  Occupational History   Not on file  Tobacco Use   Smoking status: Former    Current packs/day: 0.00    Average packs/day: 1 pack/day for 30.0 years (30.0 ttl pk-yrs)    Types: Cigarettes    Start date: 01/09/1989    Quit date: 01/10/2019    Years since quitting: 5.0   Smokeless tobacco: Former  Building services engineer status: Never Used  Substance and Sexual Activity   Alcohol  use: Not Currently    Comment: 8oz of wine a day   Drug use: Never   Sexual activity: Not on file  Other Topics Concern   Not on file  Social History Narrative   Lives with wife   Social Drivers of Health   Financial Resource Strain: Low Risk  (01/03/2024)   Overall Financial Resource Strain (CARDIA)    Difficulty of Paying Living Expenses: Not hard at all  Food Insecurity: No Food Insecurity (01/03/2024)   Hunger Vital Sign    Worried About Running Out of Food in the Last Year: Never true    Ran Out of Food in the Last Year: Never true  Transportation Needs: No Transportation Needs (01/03/2024)   PRAPARE - Administrator, Civil Service (Medical): No    Lack of Transportation (Non-Medical): No  Physical Activity: Insufficiently Active (01/03/2024)   Exercise Vital Sign    Days of Exercise per Week: 3 days    Minutes of Exercise per Session: 20 min  Stress: No Stress Concern Present (01/03/2024)   [employer] of Occupational Health - Occupational Stress Questionnaire    Feeling of Stress: Not at all  Social Connections: Socially Integrated (01/03/2024)   Social Connection and Isolation Panel    Frequency of Communication with Friends and Family: More than three times a week    Frequency of Social  Gatherings with Friends and Family: More than three times a week    Attends Religious Services: More than 4 times per year    Active Member of Clubs or Organizations: Yes    Attends Banker Meetings: More than 4 times per year    Marital Status: Married  Catering manager Violence: Not At Risk (01/03/2024)   Humiliation, Afraid, Rape, and Kick questionnaire    Fear of Current or Ex-Partner: No    Emotionally Abused: No    Physically Abused: No    Sexually Abused: No    Past Surgical History:  Procedure Laterality Date   ATRIAL FIBRILLATION ABLATION  10/2015   BACK SURGERY     CARDIOVERSION  2017   CATARACT EXTRACTION Bilateral 2017   March and April 2017  CHOLECYSTECTOMY N/A 12/21/2022   Procedure: LAPAROSCOPIC CHOLECYSTECTOMY AND REMOVAL OF BILIARY DRAIN;  Surgeon: Stevie Herlene Righter, MD;  Location: [facility];  Service: General;  Laterality: N/A;   COLONOSCOPY  2018   CORONARY STENT PLACEMENT  2012   CORONARY/GRAFT ACUTE MI REVASCULARIZATION N/A 01/10/2019   Procedure: CORONARY/GRAFT ACUTE MI REVASCULARIZATION;  Surgeon: Anner Alm ORN, MD;  Location: [facility] INVAS[facility] CV LAB;  Service: Cardiovascular;  Laterality: N/A;   INGUINAL HERNIA REPAIR     over 20 years ago   IR CHOLANGIOGRAM EXISTING TUBE  09/16/2022   IR EXCHANGE BILIARY DRAIN  10/06/2022   IR PERC CHOLECYSTOSTOMY  08/30/2022   LAPAROSCOPIC APPENDECTOMY N/A 01/17/2021   Procedure: APPENDECTOMY LAPAROSCOPIC;  Surgeon: Lyndel Deward PARAS, MD;  Location: [facility] OR;  Service: General;  Laterality: N/A;   LEFT HEART CATH AND CORONARY ANGIOGRAPHY N/A 01/10/2019   Procedure: LEFT HEART CATH AND CORONARY ANGIOGRAPHY;  Surgeon: Anner Alm ORN, MD;  Location: [facility] INVASIVE CV LAB;  Service: Cardiovascular;  Laterality: N/A;   LUMBAR LAMINECTOMY Bilateral 04/05/2016   L2-L5    VENTRAL HERNIA REPAIR  2018    Family History  Problem Relation Age of Onset   Anxiety disorder Mother    Alcohol  abuse Father    Colon cancer  Father    Depression Brother    Alcohol  abuse Brother    Throat cancer Brother     Allergies  Allergen Reactions   Naproxen Other (See Comments)    (Naprosyn *ANALGESICS - ANTI-INFLAMMATORY*) Nausea, Abdominal pain   Rapaflo [Silodosin] Other (See Comments)    Low blood pressure    Current Outpatient Medications on File Prior to Visit  Medication Sig Dispense Refill   acetaminophen  (TYLENOL ) 500 MG tablet Take 1 tablet (500 mg total) by mouth every 8 (eight) hours as needed for moderate pain. 100 tablet 0   atorvastatin  (LIPITOR ) 80 MG tablet Take 1 tablet (80 mg total) by mouth daily. 90 tablet 3   buPROPion  (WELLBUTRIN  XL) 150 MG 24 hr tablet Take 1 tablet (150 mg total) by mouth daily. 30 tablet 3   busPIRone  (BUSPAR ) 15 MG tablet Take 1 tablet (15 mg total) by mouth 3 (three) times daily. 90 tablet 3   Cholecalciferol (VITAMIN D3) 50 MCG (2000 UT) TABS Take 4,000 Units by mouth 2 (two) times daily.     docusate sodium  (COLACE) 100 MG capsule Take 100 mg by mouth daily.     DULoxetine (CYMBALTA) 30 MG capsule Take 1 capsule (30 mg total) by mouth daily. 30 capsule 6   ELIQUIS  5 MG TABS tablet TAKE 1 TABLET(5 MG) BY MOUTH TWICE DAILY 60 tablet 5   fexofenadine (ALLEGRA) 180 MG tablet Take 180 mg by mouth at bedtime.      finasteride  (PROSCAR ) 5 MG tablet Take 1 tablet (5 mg total) by mouth daily. 90 tablet 1   lamoTRIgine  (LAMICTAL ) 100 MG tablet Take 1 tablet (100 mg total) by mouth daily. 90 tablet 1   lidocaine -prilocaine (EMLA) cream Apply 1 Application topically as needed. 30 g 0   LORazepam  (ATIVAN ) 0.5 MG tablet TAKE 1 TABLET BY MOUTH ONLY FOR SEVERE ANXIETY OR AGITATION 30 tablet 3   Melatonin 10 MG TABS Take 10 mg by mouth at bedtime.     MUCINEX  600 MG 12 hr tablet TAKE 2 TABLETS BY MOUTH TWICE DAILY 120 tablet 0   nitroGLYCERIN  (NITROSTAT ) 0.4 MG SL tablet Place 1 tablet (0.4 mg total) under the tongue every 5 (five) minutes as needed  for chest pain. 25 tablet 3    ondansetron  (ZOFRAN ) 4 MG tablet Take 1 tablet (4 mg total) by mouth every 8 (eight) hours as needed for nausea or vomiting. 20 tablet 0   OXYGEN  Inhale 2 L into the lungs daily.     pantoprazole  (PROTONIX ) 40 MG tablet Take 1 tablet (40 mg total) by mouth daily. 30 tablet 6   Pirfenidone  (ESBRIET ) 801 MG TABS Take 1 tablet (801 mg total) by mouth 3 (three) times daily. 270 tablet 1   polyethylene glycol powder (GLYCOLAX /MIRALAX ) 17 GM/SCOOP powder Take 17 g by mouth 2 (two) times daily.     pregabalin  (LYRICA ) 150 MG capsule TAKE 1 CAPSULE(150 MG) BY MOUTH TWICE DAILY 60 capsule 5   ranolazine  (RANEXA ) 1000 MG SR tablet Take 1 tablet (1,000 mg total) by mouth 2 (two) times daily. 180 tablet 2   traMADol  (ULTRAM ) 50 MG tablet Take 1 tablet (50 mg total) by mouth 2 (two) times daily. 60 tablet 5   traZODone  (DESYREL ) 50 MG tablet TAKE 1 TABLET(50 MG) BY MOUTH AT BEDTIME 90 tablet 0   vitamin B-12 (CYANOCOBALAMIN ) 1000 MCG tablet Take 1,000 mcg by mouth daily.     vitamin E 1000 UNIT capsule Take 1,000 Units by mouth 2 (two) times daily at 8 am and 10 pm.     zolpidem  (AMBIEN ) 5 MG tablet TAKE 1 TABLET(5 MG) BY MOUTH AT BEDTIME 30 tablet 3   famotidine  (PEPCID ) 40 MG tablet Take 1 tablet (40 mg total) by mouth daily. (Patient not taking: Reported on 01/12/2024) 90 tablet 3   levalbuterol  (XOPENEX ) 1.25 MG/0.5ML nebulizer solution Take 1.25 mg by nebulization every 4 (four) hours as needed for wheezing or shortness of breath. (Patient not taking: Reported on 01/12/2024) 1 each 12   Multiple Vitamins-Minerals (PRESERVISION AREDS PO) Take 1 capsule by mouth in the morning and at bedtime. (Patient not taking: Reported on 01/12/2024)     sertraline  (ZOLOFT ) 100 MG tablet TAKE 2 TABLETS(200 MG) BY MOUTH AT BEDTIME (Patient not taking: Reported on 01/12/2024) 180 tablet 0   Current Facility-Administered Medications on File Prior to Visit  Medication Dose Route Frequency Provider Last Rate Last Admin    regadenoson  (LEXISCAN ) injection SOLN 0.4 mg  0.4 mg Intravenous Once Tobb, Kardie, DO       technetium tetrofosmin  (TC-MYOVIEW ) injection 31.7 millicurie  31.7 millicurie Intravenous Once PRN Tobb, Kardie, DO        BP 124/70   Pulse 88   Resp 15   Ht 5' 11 (1.803 m)   Wt 212 lb (96.2 kg)   SpO2 91%   BMI 29.57 kg/m          Objective:   Physical Exam  General- No acute distress. Pleasant patient. Neck- Full range of motion, no jvd Lungs- Clear, even and unlabored. Heart- regular rate and rhythm. Neurologic- CNII- XII grossly intact.  Skin- various small dark hyperpigmented small lesions on face. Left side of nose 4 small lesions in line. One small area slight broken down but only 1 mm wide. Also at base of neck 7 mm dark hyperpigmented lesion. Small moles on rt side of cheek.        Assessment & Plan:   Patient Instructions  Multiple hyperpigmented lesions and one tiny raw lesion on nose and neck with recurrent bleeding. Concern for potential skin cancer due to family history. - Refer to dermatologist for evaluation of lesions.  Interstitial lung disease Ongoing management with pulmonologist. Recent  high-resolution CT scan in February. Discussion of potential addition of YPF to treatment regimen. per pt. - Continue follow-up with pulmonologist. - Consider new medication YPF for interstitial lung disease. -continue current medications rx'd by pulmonologist.  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 Managed with tramadol , Lyrica , Cymbalta. Neurology prescribed lidocaine  cream for symptomatic relief. Upcoming appointment with pain specialist. - Continue current medications: tramadol , Lyrica , Cymbalta. - Use lidocaine  cream as prescribed. - Follow up with pain/spine specialist next Tuesday.  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  Managed with medications prescribed by neurology. Upcoming appointment with spine and pain specialist. - Continue current medication regimen as per neurology. - Follow up with spine and pain specialist.  General Health Maintenance Recent flu shot and COVID vaccine administered. Discussion of upcoming fasting labs for routine health maintenance. - Schedule fasting labs/regular check up before Thanksgiving. - Instruct to come in fasting, excpet allowing a banana with morning medications.   Fuad Forget, [name]

## 2024-01-12 NOTE — Patient Instructions (Signed)
 Multiple hyperpigmented lesions and one tiny raw lesion on nose and neck with recurrent bleeding. Concern for potential skin cancer due to family history. - Refer to dermatologist for evaluation of lesions.  Interstitial lung disease Ongoing management with pulmonologist. Recent high-resolution CT scan in February. Discussion of potential addition of YPF to treatment regimen. per pt. - Continue follow-up with pulmonologist. - Consider new medication YPF for interstitial lung disease. -continue current medications rx'd by pulmonologist.  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 Managed with tramadol , Lyrica , Cymbalta. Neurology prescribed lidocaine  cream for symptomatic relief. Upcoming appointment with pain specialist. - Continue current medications: tramadol , Lyrica , Cymbalta. - Use lidocaine  cream as prescribed. - Follow up with pain/spine specialist next Tuesday.  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 Managed with medications prescribed by neurology. Upcoming appointment with spine and pain specialist. - Continue current medication regimen as per neurology. - Follow up with spine and pain specialist.  General Health Maintenance Recent flu shot and COVID vaccine administered. Discussion of upcoming fasting labs for routine health maintenance. - Schedule fasting labs/regular check up before Thanksgiving. - Instruct to come in fasting, excpet allowing a banana with morning medications.

## 2024-01-17 DIAGNOSIS — Z79891 Long term (current) use of opiate analgesic: Secondary | ICD-10-CM | POA: Diagnosis not present

## 2024-01-17 DIAGNOSIS — M51362 Other intervertebral disc degeneration, lumbar region with discogenic back pain and lower extremity pain: Secondary | ICD-10-CM | POA: Diagnosis not present

## 2024-01-23 ENCOUNTER — Other Ambulatory Visit (HOSPITAL_COMMUNITY): Payer: Self-pay

## 2024-01-23 ENCOUNTER — Telehealth: Payer: Self-pay

## 2024-01-23 NOTE — Telephone Encounter (Signed)
 Pharmacy Patient Advocate Encounter   Received notification from Patient Pharmacy that prior authorization for Lidocaine -Prilocaine 2.5-2.5% cream is required/requested.   Insurance verification completed.   The patient is insured through [employer]   Prior Authorization for Lidocaine -Prilocaine 2.5-2.5% cream has been APPROVED from 01-23-2024 to 01-22-2025   PA #/Case ID/Reference #: [id number]

## 2024-01-26 ENCOUNTER — Ambulatory Visit: Admitting: Medical

## 2024-01-26 VITALS — BP 110/74 | HR 91 | Temp 97.7°F | Resp 15 | Ht 71.0 in | Wt 213.2 lb

## 2024-01-26 DIAGNOSIS — F419 Anxiety disorder, unspecified: Secondary | ICD-10-CM

## 2024-01-26 DIAGNOSIS — E785 Hyperlipidemia, unspecified: Secondary | ICD-10-CM | POA: Diagnosis not present

## 2024-01-26 DIAGNOSIS — J84112 Idiopathic pulmonary fibrosis: Secondary | ICD-10-CM

## 2024-01-26 DIAGNOSIS — F32A Depression, unspecified: Secondary | ICD-10-CM

## 2024-01-26 DIAGNOSIS — R739 Hyperglycemia, unspecified: Secondary | ICD-10-CM

## 2024-01-26 NOTE — Patient Instructions (Addendum)
 Hyperlipidemia Chronic hyperlipidemia with elevated triglycerides and cholesterol. Currently on atorvastatin  80 mg daily. - Ordered lipid panel. - Continue atorvastatin  80 mg daily.  Elevated sugar/prediabets Previous average blood sugar of 118 mg/dL. - Ordered A1c.  Idiopathic pulmonary fibrosis Chronic idiopathic pulmonary fibrosis managed with supplemental oxygen . Oxygen  saturation 93% presently. - Continue supplemental oxygen  as needed. - Consider new medication YPF for interstitial lung disease. -follow up with pulmonologist as scheduled.  Depression and anxiety disorder Chronic depression and anxiety managed with Wellbutrin , Buspar , Cymbalta, and Ativan . - Continue Wellbutrin , Buspar , Cymbalta, and Ativan . - Regular follow-up with psychiatrist every six months.  General Health Maintenance Routine health maintenance discussed. - Ordered metabolic panel. - Advised on healthy dietary habits and regular medication intake.  Follow up date to be determined after lab review

## 2024-01-26 NOTE — Progress Notes (Unsigned)
 Subjective:    Patient ID: Robert Lang, male    DOB: [birth year], 73 y.o.   MRN: [id number]  HPI Robert Lang is a 73 year old male with high cholesterol and prediabetes who presents for follow-up and lab work.  He experiences difficulty getting started in the morning despite sleeping from 11 PM to 7:30 AM, which is longer than his usual 4-6 hours of sleep. He feels sluggish and has not eaten breakfast yet due to needing to take medication with food.  He is currently on atorvastatin  80 mg daily for high cholesterol. His triglycerides were 366 mg/dL and cholesterol was 841 mg/dL ten months ago. He reports difficulty getting dressed and struggles to put on socks.  He has a history of prediabetes with a sugar average of 118 mg/dL last December. He enjoys chocolate Kisses daily and occasionally eats cake or pie. He is awaiting updated lab results to assess his current status.  He had his gallbladder removed after experiencing elevated pancreatic enzymes and frequent hospitalizations due to gallbladder issues. He recalls having a blood clot on his line before the gallbladder removal.  He has idiopathic pulmonary fibrosis and uses oxygen  therapy, especially after showering when his oxygen  level drops to 87%. He was previously on Xopenex  nebulizer treatments but is not currently using them.  He is on Wellbutrin , Buspar , Cymbalta, and Ativan  for depression and anxiety, taking them at 8 AM, 1 PM, and no later than 8 PM. He sees a psychiatrist every six months and finds the medication helpful in managing his mood, especially in stressful situations.    Review of Systems  Constitutional:  Negative for chills and fatigue.  HENT:  Negative for congestion, ear pain, hearing loss and postnasal drip.   Respiratory:  Negative for chest tightness, shortness of breath and wheezing.        On oxygen . Pt is stable though baseline short of breath due to ILD.  Cardiovascular:  Negative for  palpitations.  Gastrointestinal:  Negative for abdominal pain.  Genitourinary:  Negative for dysuria and frequency.  Musculoskeletal:  Negative for back pain.  Neurological:  Negative for dizziness, speech difficulty and light-headedness.  Hematological:  Negative for adenopathy.  Psychiatric/Behavioral:  Positive for dysphoric mood. Negative for behavioral problems, decreased concentration and suicidal ideas. The patient is nervous/anxious.        Stable on meds.    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 M75.00)   Allergic rhinitis due to pollen 09/03/2013   J30.1)  Formatting of this note might be different from the original. J30.1)   Allergy    Anticoagulated 07/01/2016   Anxiety    Arthritis    Atrial fibrillation (HCC)    Atrial flutter (HCC) 06/26/2015   CAD (coronary artery disease)    Chest pain 06/26/2015   COPD (chronic obstructive pulmonary disease) (HCC)    Coronary artery disease 07/06/2018   Cardiac catheterization 2017 showing 90% small diagonal branch disease   DDD (degenerative disc disease), cervical 09/03/2013   M50.90)  Formatting of this note might be different from the original. M50.90)   Depression    Dizziness 10/28/2017   Dyslipidemia, goal LDL below 70 07/06/2018   Dyspnea on exertion 10/20/2018   Dysrhythmia    Essential hypertension 07/06/2018   ETOH abuse 01/11/2019   6 pack  of beer per day   Falls 10/28/2017   GERD (gastroesophageal reflux disease)    H/O amiodarone therapy 07/01/2016   Heart palpitations 01/27/2017   History of colon polyps    History of kidney stones    Hyperlipidemia    Hypertension    IPF (idiopathic pulmonary fibrosis) (HCC)    Neck pain 09/03/2013   Neuropathy 07/06/2018   Obstructive sleep apnea 08/05/2015    PLMD (periodic limb movement disorder) 11/04/2015   Polycythemia, 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 STORY: Due to alcohol / tobacco   Pure hypercholesterolemia 09/03/2013   E78.0)  Formatting of this note might be different from the original. 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/06/2018   2017     Social History   Socioeconomic History   Marital status: Married    Spouse name: Not on file   Number of children: Not on file   Years of education: Not on file   Highest education level: 12th grade  Occupational History   Not on file  Tobacco Use   Smoking status: Former    Current packs/day: 0.00    Average packs/day: 1 pack/day for 30.0 years (30.0 ttl pk-yrs)    Types: Cigarettes    Start date: 01/09/1989    Quit date: 01/10/2019    Years since quitting: 5.0   Smokeless tobacco: Former  Building Services Engineer status: Never Used  Substance and Sexual Activity   Alcohol  use: Not Currently    Comment: 8oz of wine a day   Drug use: Never   Sexual activity: Not on file  Other Topics Concern   Not on file  Social History Narrative   Lives with wife   Social Drivers of Health   Financial Resource Strain: Low Risk  (01/03/2024)   Overall Financial Resource Strain (CARDIA)    Difficulty of Paying Living Expenses: Not hard at all  Food Insecurity: No Food Insecurity (01/03/2024)   Hunger Vital Sign    Worried About Running Out of Food in the Last Year: Never true    Ran Out of Food in the Last Year: Never true  Transportation Needs: No Transportation Needs (01/03/2024)   PRAPARE - Administrator, Civil Service (Medical): No    Lack of Transportation (Non-Medical): No  Physical Activity: Insufficiently Active (01/03/2024)   Exercise Vital Sign    Days of Exercise per Week: 3 days    Minutes of Exercise per Session: 20 min   Stress: No Stress Concern Present (01/03/2024)   [employer] of Occupational Health - Occupational Stress Questionnaire    Feeling of Stress: Not at all  Social Connections: Socially Integrated (01/03/2024)   Social Connection and Isolation Panel    Frequency of Communication with Friends and Family: More than three times a week    Frequency of Social Gatherings with Friends and Family: More than three times a week    Attends Religious Services: More than 4 times per year    Active Member of [employer] or Organizations: Yes    Attends Banker Meetings: More than 4 times per year    Marital Status: Married  Catering Manager Violence: Not At Risk (01/03/2024)   Humiliation, Afraid, Rape, and Kick questionnaire    Fear of Current or Ex-Partner: No    Emotionally Abused: No    Physically  Abused: No    Sexually Abused: No    Past Surgical History:  Procedure Laterality Date   ATRIAL FIBRILLATION ABLATION  10/2015   BACK SURGERY     CARDIOVERSION  2017   CATARACT EXTRACTION Bilateral 2017   March and April 2017   CHOLECYSTECTOMY N/A 12/21/2022   Procedure: LAPAROSCOPIC CHOLECYSTECTOMY AND REMOVAL OF BILIARY DRAIN;  Surgeon: Stevie Herlene Righter, MD;  Location: [facility];  Service: General;  Laterality: N/A;   COLONOSCOPY  2018   CORONARY STENT PLACEMENT  2012   CORONARY/GRAFT ACUTE MI REVASCULARIZATION N/A 01/10/2019   Procedure: CORONARY/GRAFT ACUTE MI REVASCULARIZATION;  Surgeon: Anner Alm ORN, MD;  Location: [facility] INVASIVE CV LAB;  Service: Cardiovascular;  Laterality: N/A;   INGUINAL HERNIA REPAIR     over 20 years ago   IR CHOLANGIOGRAM EXISTING TUBE  09/16/2022   IR EXCHANGE BILIARY DRAIN  10/06/2022   IR PERC CHOLECYSTOSTOMY  08/30/2022   LAPAROSCOPIC APPENDECTOMY N/A 01/17/2021   Procedure: APPENDECTOMY LAPAROSCOPIC;  Surgeon: Lyndel Deward PARAS, MD;  Location: [facility] OR;  Service: General;  Laterality: N/A;   LEFT HEART CATH AND CORONARY ANGIOGRAPHY N/A 01/10/2019    Procedure: LEFT HEART CATH AND CORONARY ANGIOGRAPHY;  Surgeon: Anner Alm ORN, MD;  Location: [facility] INVASIVE CV LAB;  Service: Cardiovascular;  Laterality: N/A;   LUMBAR LAMINECTOMY Bilateral 04/05/2016   L2-L5    VENTRAL HERNIA REPAIR  2018    Family History  Problem Relation Age of Onset   Anxiety disorder Mother    Alcohol  abuse Father    Colon cancer Father    Depression Brother    Alcohol  abuse Brother    Throat cancer Brother     Allergies  Allergen Reactions   Naproxen Other (See Comments)    (Naprosyn *ANALGESICS - ANTI-INFLAMMATORY*) Nausea, Abdominal pain   Rapaflo [Silodosin] Other (See Comments)    Low blood pressure    Current Outpatient Medications on File Prior to Visit  Medication Sig Dispense Refill   acetaminophen  (TYLENOL ) 500 MG tablet Take 1 tablet (500 mg total) by mouth every 8 (eight) hours as needed for moderate pain. 100 tablet 0   atorvastatin  (LIPITOR ) 80 MG tablet Take 1 tablet (80 mg total) by mouth daily. 90 tablet 3   buPROPion  (WELLBUTRIN  XL) 150 MG 24 hr tablet Take 1 tablet (150 mg total) by mouth daily. 30 tablet 3   busPIRone  (BUSPAR ) 15 MG tablet Take 1 tablet (15 mg total) by mouth 3 (three) times daily. 90 tablet 3   Cholecalciferol (VITAMIN D3) 50 MCG (2000 UT) TABS Take 4,000 Units by mouth 2 (two) times daily.     docusate sodium  (COLACE) 100 MG capsule Take 100 mg by mouth daily.     DULoxetine (CYMBALTA) 30 MG capsule Take 1 capsule (30 mg total) by mouth daily. 30 capsule 6   ELIQUIS  5 MG TABS tablet TAKE 1 TABLET(5 MG) BY MOUTH TWICE DAILY 60 tablet 5   fexofenadine (ALLEGRA) 180 MG tablet Take 180 mg by mouth at bedtime.      finasteride  (PROSCAR ) 5 MG tablet Take 1 tablet (5 mg total) by mouth daily. 90 tablet 1   lamoTRIgine  (LAMICTAL ) 100 MG tablet Take 1 tablet (100 mg total) by mouth daily. 90 tablet 1   lidocaine -prilocaine (EMLA) cream Apply 1 Application topically as needed. 30 g 0   LORazepam  (ATIVAN ) 0.5 MG tablet TAKE 1  TABLET BY MOUTH ONLY FOR SEVERE ANXIETY OR AGITATION 30 tablet 3   Melatonin 10 MG  TABS Take 10 mg by mouth at bedtime.     MUCINEX  600 MG 12 hr tablet TAKE 2 TABLETS BY MOUTH TWICE DAILY 120 tablet 0   Multiple Vitamins-Minerals (PRESERVISION AREDS PO) Take 1 capsule by mouth in the morning and at bedtime.     nitroGLYCERIN  (NITROSTAT ) 0.4 MG SL tablet Place 1 tablet (0.4 mg total) under the tongue every 5 (five) minutes as needed for chest pain. 25 tablet 3   ondansetron  (ZOFRAN ) 4 MG tablet Take 1 tablet (4 mg total) by mouth every 8 (eight) hours as needed for nausea or vomiting. 20 tablet 0   OXYGEN  Inhale 2 L into the lungs daily.     pantoprazole  (PROTONIX ) 40 MG tablet Take 1 tablet (40 mg total) by mouth daily. 30 tablet 6   Pirfenidone  (ESBRIET ) 801 MG TABS Take 1 tablet (801 mg total) by mouth 3 (three) times daily. 270 tablet 1   polyethylene glycol powder (GLYCOLAX /MIRALAX ) 17 GM/SCOOP powder Take 17 g by mouth 2 (two) times daily.     pregabalin  (LYRICA ) 150 MG capsule TAKE 1 CAPSULE(150 MG) BY MOUTH TWICE DAILY 60 capsule 5   ranolazine  (RANEXA ) 1000 MG SR tablet Take 1 tablet (1,000 mg total) by mouth 2 (two) times daily. 180 tablet 2   sertraline  (ZOLOFT ) 100 MG tablet TAKE 2 TABLETS(200 MG) BY MOUTH AT BEDTIME 180 tablet 0   traMADol  (ULTRAM ) 50 MG tablet Take 50 mg by mouth 2 (two) times daily.     traZODone  (DESYREL ) 50 MG tablet TAKE 1 TABLET(50 MG) BY MOUTH AT BEDTIME 90 tablet 0   vitamin B-12 (CYANOCOBALAMIN ) 1000 MCG tablet Take 1,000 mcg by mouth daily.     vitamin E 1000 UNIT capsule Take 1,000 Units by mouth 2 (two) times daily at 8 am and 10 pm.     zolpidem  (AMBIEN ) 5 MG tablet TAKE 1 TABLET(5 MG) BY MOUTH AT BEDTIME 30 tablet 3   famotidine  (PEPCID ) 40 MG tablet Take 1 tablet (40 mg total) by mouth daily. (Patient not taking: Reported on 01/26/2024) 90 tablet 3   levalbuterol  (XOPENEX ) 1.25 MG/0.5ML nebulizer solution Take 1.25 mg by nebulization every 4 (four) hours  as needed for wheezing or shortness of breath. (Patient not taking: Reported on 01/26/2024) 1 each 12   traMADol  (ULTRAM ) 50 MG tablet Take 1 tablet (50 mg total) by mouth 2 (two) times daily. (Patient not taking: Reported on 01/26/2024) 60 tablet 5   Current Facility-Administered Medications on File Prior to Visit  Medication Dose Route Frequency Provider Last Rate Last Admin   regadenoson  (LEXISCAN ) injection SOLN 0.4 mg  0.4 mg Intravenous Once Tobb, Kardie, DO       technetium tetrofosmin  (TC-MYOVIEW ) injection 31.7 millicurie  31.7 millicurie Intravenous Once PRN Tobb, Kardie, DO        BP 110/74   Pulse 91   Temp 97.7 F (36.5 C) (Oral)   Resp 15   Ht 5' 11 (1.803 m)   Wt 213 lb 3.2 oz (96.7 kg)   SpO2 93%   BMI 29.74 kg/m        Objective:   Physical Exam  General- No acute distress. Pleasant patient. Neck- Full range of motion, no jvd Lungs- mild shallow breaths,  even and unlabored. No wheezing or rough breath sounds Heart- regular rate and rhythm. Neurologic- CNII- XII grossly intact.  Skin- various small dark hyperpigmented small lesions on face. Left side of nose 4 small lesions in line. One small area slight broken  down but only 1 mm wide. Also at base of neck 7 mm dark hyperpigmented lesion. Small moles on rt side of cheek.  Lower ext- calf thin, symmetric, negative homans sign. No pedal edema.    Assessment & Plan:  Hyperlipidemia Chronic hyperlipidemia with elevated triglycerides and cholesterol. Currently on atorvastatin  80 mg daily. - Ordered lipid panel. - Continue atorvastatin  80 mg daily.  Elevated sugar/Prediabetes Previous average blood sugar of 118 mg/dL. - Ordered A1c.  Idiopathic pulmonary fibrosis Chronic idiopathic pulmonary fibrosis managed with supplemental oxygen . Oxygen  saturation 93% presently. - Continue supplemental oxygen  as needed. - Consider new medication YPF for interstitial lung disease. -follow up with pulmonologist as  scheduled.  Depression and anxiety disorder Chronic depression and anxiety managed with Wellbutrin , Buspar , Cymbalta, and Ativan . - Continue Wellbutrin , Buspar , Cymbalta, and Ativan . - Regular follow-up with psychiatrist every six months.  General Health Maintenance Routine health maintenance discussed. - Ordered metabolic panel. - Advised on healthy dietary habits and regular medication intake.  Follow up date to be determined after lab review   Dallas Maxwell, [name]

## 2024-01-27 LAB — LIPID PANEL
Cholesterol: 169 mg/dL (ref <200)
HDL: 25 mg/dL — ABNORMAL LOW (ref >=40)
Non-HDL Cholesterol (Calc): 144 mg/dL — ABNORMAL HIGH (ref <130)
Total CHOL/HDL Ratio: 6.8 (calc) — ABNORMAL HIGH (ref <5.0)
Triglycerides: 461 mg/dL — ABNORMAL HIGH (ref <150)

## 2024-01-27 LAB — HEMOGLOBIN A1C
Hgb A1c MFr Bld: 5.4 % (ref <5.7)
Mean Plasma Glucose: 108 mg/dL
eAG (mmol/L): 6 mmol/L

## 2024-01-27 LAB — COMPLETE METABOLIC PANEL WITHOUT GFR
AG Ratio: 1.5 (calc) (ref 1.0–2.5)
ALT: 23 U/L (ref 9–46)
AST: 20 U/L (ref 10–35)
Albumin: 4.1 g/dL (ref 3.6–5.1)
Alkaline phosphatase (APISO): 67 U/L (ref 35–144)
BUN: 15 mg/dL (ref 7–25)
CO2: 27 mmol/L (ref 20–32)
Calcium: 9.2 mg/dL (ref 8.6–10.3)
Chloride: 101 mmol/L (ref 98–110)
Creat: 1.09 mg/dL (ref 0.70–1.28)
Globulin: 2.8 g/dL (ref 1.9–3.7)
Glucose, Bld: 108 mg/dL — ABNORMAL HIGH (ref 65–99)
Potassium: 4.8 mmol/L (ref 3.5–5.3)
Sodium: 138 mmol/L (ref 135–146)
Total Bilirubin: 0.6 mg/dL (ref 0.2–1.2)
Total Protein: 6.9 g/dL (ref 6.1–8.1)

## 2024-01-28 ENCOUNTER — Ambulatory Visit: Payer: Self-pay | Admitting: Medical

## 2024-02-07 DIAGNOSIS — A419 Sepsis, unspecified organism: Secondary | ICD-10-CM | POA: Diagnosis not present

## 2024-02-07 DIAGNOSIS — I10 Essential (primary) hypertension: Secondary | ICD-10-CM | POA: Diagnosis not present

## 2024-02-07 DIAGNOSIS — Z86711 Personal history of pulmonary embolism: Secondary | ICD-10-CM | POA: Diagnosis not present

## 2024-02-07 DIAGNOSIS — I2693 Single subsegmental pulmonary embolism without acute cor pulmonale: Secondary | ICD-10-CM | POA: Diagnosis not present

## 2024-02-09 DIAGNOSIS — A419 Sepsis, unspecified organism: Secondary | ICD-10-CM | POA: Diagnosis not present

## 2024-02-09 DIAGNOSIS — I10 Essential (primary) hypertension: Secondary | ICD-10-CM | POA: Diagnosis not present

## 2024-02-09 DIAGNOSIS — I2693 Single subsegmental pulmonary embolism without acute cor pulmonale: Secondary | ICD-10-CM | POA: Diagnosis not present

## 2024-02-09 DIAGNOSIS — Z86711 Personal history of pulmonary embolism: Secondary | ICD-10-CM | POA: Diagnosis not present

## 2024-02-10 ENCOUNTER — Ambulatory Visit: Admitting: Behavioral Health

## 2024-02-10 NOTE — Progress Notes (Signed)
 Pt did not show for scheduled appt and did not provide 24 hour notice as required but he has no prior hx of no shows. No charge this time.

## 2024-02-14 DIAGNOSIS — Z79891 Long term (current) use of opiate analgesic: Secondary | ICD-10-CM | POA: Diagnosis not present

## 2024-02-14 DIAGNOSIS — M5416 Radiculopathy, lumbar region: Secondary | ICD-10-CM | POA: Diagnosis not present

## 2024-02-17 ENCOUNTER — Other Ambulatory Visit: Payer: Self-pay | Admitting: Behavioral Health

## 2024-02-17 DIAGNOSIS — G4700 Insomnia, unspecified: Secondary | ICD-10-CM

## 2024-02-21 DIAGNOSIS — L718 Other rosacea: Secondary | ICD-10-CM | POA: Diagnosis not present

## 2024-02-21 DIAGNOSIS — L821 Other seborrheic keratosis: Secondary | ICD-10-CM | POA: Diagnosis not present

## 2024-02-21 DIAGNOSIS — I781 Nevus, non-neoplastic: Secondary | ICD-10-CM | POA: Diagnosis not present

## 2024-02-22 ENCOUNTER — Telehealth: Payer: Self-pay

## 2024-02-22 NOTE — Telephone Encounter (Signed)
 Copied from CRM #[id number]. Topic: General - Other >> Feb 22, 2024 12:10 PM Whitney O wrote: Reason for CRM: patient wife ms connie is asking questions concerning patient being approved for a copay relief foundation that helps with co pay and a relief of co pay on a prescription . Patient wife is asking questions on how to go about getting his ov with co pay resubmitted to the foundation  Please reach out to patient wife Ms jori  [phone]

## 2024-02-23 DIAGNOSIS — G4733 Obstructive sleep apnea (adult) (pediatric): Secondary | ICD-10-CM | POA: Diagnosis not present

## 2024-02-27 ENCOUNTER — Encounter: Payer: Self-pay | Admitting: Behavioral Health

## 2024-02-27 ENCOUNTER — Ambulatory Visit: Admitting: Behavioral Health

## 2024-02-27 DIAGNOSIS — R451 Restlessness and agitation: Secondary | ICD-10-CM

## 2024-02-27 DIAGNOSIS — F411 Generalized anxiety disorder: Secondary | ICD-10-CM | POA: Diagnosis not present

## 2024-02-27 DIAGNOSIS — F39 Unspecified mood [affective] disorder: Secondary | ICD-10-CM | POA: Diagnosis not present

## 2024-02-27 DIAGNOSIS — G4700 Insomnia, unspecified: Secondary | ICD-10-CM

## 2024-02-27 DIAGNOSIS — F32A Depression, unspecified: Secondary | ICD-10-CM

## 2024-02-27 MED ORDER — LORAZEPAM 0.5 MG PO TABS: ORAL_TABLET | ORAL | 5 refills | Status: AC

## 2024-02-27 MED ORDER — BUPROPION HCL ER (XL) 150 MG PO TB24: 150.0000 mg | ORAL_TABLET | Freq: Every day | ORAL | 1 refills | Status: AC

## 2024-02-27 MED ORDER — SERTRALINE HCL 100 MG PO TABS: 200.0000 mg | ORAL_TABLET | Freq: Every day | ORAL | 0 refills | Status: AC

## 2024-02-27 MED ORDER — TRAZODONE HCL 50 MG PO TABS: ORAL_TABLET | ORAL | 1 refills | Status: AC

## 2024-02-27 MED ORDER — BUSPIRONE HCL 15 MG PO TABS: 15.0000 mg | ORAL_TABLET | Freq: Three times a day (TID) | ORAL | 1 refills | Status: DC

## 2024-02-27 MED ORDER — LAMOTRIGINE 150 MG PO TABS: 150.0000 mg | ORAL_TABLET | Freq: Every day | ORAL | 1 refills | Status: AC

## 2024-02-27 NOTE — Progress Notes (Signed)
 [facility]  Patient ID: Robert Lang,  MRN: [id number]  PCP: Dorina Loving, [name]  Date of Evaluation: 02/27/2024 Time spent:30 minutes  Chief Complaint:  Chief Complaint   Depression; Anxiety; Follow-up; Medication Refill; Patient Education; Stress     HISTORY/CURRENT STATUS: HPI  73 yo Robert Lang male presents for follow up and medication management. Says he is a little more depressed due to the holidays, but managing best he can. Still having some problems with agitation.   Says he has so many health issues but trying to take it one day at a time. He has oxygen  concentrator with him today. Dx of idiopathic pulmonary fibrosis. He is wanting to increase his Lamictal  to help with moods.   Sleep is ok but utilizing Ambien  and Trazodone . Sleeps with C-pap.  Rates anxiety 3/10 and depression 4/10. Denies hx of mania, no psychosis. No auditory or visual hallucinations. SI or HI     Individual Medical History/ Review of Systems: Changes? :No   Allergies: Naproxen and Rapaflo [silodosin]  Current Medications:  Current Outpatient Medications:    lamoTRIgine  (LAMICTAL ) 150 MG tablet, Take 1 tablet (150 mg total) by mouth daily., Disp: 90 tablet, Rfl: 1   acetaminophen  (TYLENOL ) 500 MG tablet, Take 1 tablet (500 mg total) by mouth every 8 (eight) hours as needed for moderate pain., Disp: 100 tablet, Rfl: 0   atorvastatin  (LIPITOR ) 80 MG tablet, Take 1 tablet (80 mg total) by mouth daily., Disp: 90 tablet, Rfl: 3   buPROPion  (WELLBUTRIN  XL) 150 MG 24 hr tablet, Take 1 tablet (150 mg total) by mouth daily., Disp: 90 tablet, Rfl: 1   busPIRone  (BUSPAR ) 15 MG tablet, Take 1 tablet (15 mg total) by mouth 3 (three) times daily., Disp: 270 tablet, Rfl: 1   Cholecalciferol (VITAMIN D3) 50 MCG (2000 UT) TABS, Take 4,000 Units by mouth 2 (two) times daily., Disp: , Rfl:    docusate sodium  (COLACE) 100 MG capsule, Take 100 mg by mouth daily., Disp: , Rfl:    DULoxetine  (CYMBALTA ) 30 MG  capsule, Take 1 capsule (30 mg total) by mouth daily., Disp: 30 capsule, Rfl: 6   ELIQUIS  5 MG TABS tablet, TAKE 1 TABLET(5 MG) BY MOUTH TWICE DAILY, Disp: 60 tablet, Rfl: 5   famotidine  (PEPCID ) 40 MG tablet, Take 1 tablet (40 mg total) by mouth daily. (Patient not taking: Reported on 01/26/2024), Disp: 90 tablet, Rfl: 3   fexofenadine (ALLEGRA) 180 MG tablet, Take 180 mg by mouth at bedtime. , Disp: , Rfl:    finasteride  (PROSCAR ) 5 MG tablet, Take 1 tablet (5 mg total) by mouth daily., Disp: 90 tablet, Rfl: 1   lamoTRIgine  (LAMICTAL ) 100 MG tablet, Take 1 tablet (100 mg total) by mouth daily., Disp: 90 tablet, Rfl: 1   levalbuterol  (XOPENEX ) 1.25 MG/0.5ML nebulizer solution, Take 1.25 mg by nebulization every 4 (four) hours as needed for wheezing or shortness of breath. (Patient not taking: Reported on 01/26/2024), Disp: 1 each, Rfl: 12   lidocaine -prilocaine  (EMLA ) cream, Apply 1 Application topically as needed., Disp: 30 g, Rfl: 0   LORazepam  (ATIVAN ) 0.5 MG tablet, TAKE 1 TABLET BY MOUTH ONLY FOR SEVERE ANXIETY OR AGITATION, Disp: 30 tablet, Rfl: 5   Melatonin 10 MG TABS, Take 10 mg by mouth at bedtime., Disp: , Rfl:    MUCINEX  600 MG 12 hr tablet, TAKE 2 TABLETS BY MOUTH TWICE DAILY, Disp: 120 tablet, Rfl: 0   Multiple Vitamins-Minerals (PRESERVISION AREDS PO), Take 1 capsule by mouth in the  morning and at bedtime., Disp: , Rfl:    nitroGLYCERIN  (NITROSTAT ) 0.4 MG SL tablet, Place 1 tablet (0.4 mg total) under the tongue every 5 (five) minutes as needed for chest pain., Disp: 25 tablet, Rfl: 3   ondansetron  (ZOFRAN ) 4 MG tablet, Take 1 tablet (4 mg total) by mouth every 8 (eight) hours as needed for nausea or vomiting., Disp: 20 tablet, Rfl: 0   OXYGEN , Inhale 2 L into the lungs daily., Disp: , Rfl:    pantoprazole  (PROTONIX ) 40 MG tablet, Take 1 tablet (40 mg total) by mouth daily., Disp: 30 tablet, Rfl: 6   Pirfenidone  (ESBRIET ) 801 MG TABS, Take 1 tablet (801 mg total) by mouth 3 (three)  times daily., Disp: 270 tablet, Rfl: 1   polyethylene glycol powder (GLYCOLAX /MIRALAX ) 17 GM/SCOOP powder, Take 17 g by mouth 2 (two) times daily., Disp: , Rfl:    pregabalin  (LYRICA ) 150 MG capsule, TAKE 1 CAPSULE(150 MG) BY MOUTH TWICE DAILY, Disp: 60 capsule, Rfl: 5   ranolazine  (RANEXA ) 1000 MG SR tablet, Take 1 tablet (1,000 mg total) by mouth 2 (two) times daily., Disp: 180 tablet, Rfl: 2   sertraline  (ZOLOFT ) 100 MG tablet, Take 2 tablets (200 mg total) by mouth daily., Disp: 180 tablet, Rfl: 0   traMADol  (ULTRAM ) 50 MG tablet, Take 1 tablet (50 mg total) by mouth 2 (two) times daily. (Patient not taking: Reported on 01/26/2024), Disp: 60 tablet, Rfl: 5   traMADol  (ULTRAM ) 50 MG tablet, Take 50 mg by mouth 2 (two) times daily., Disp: , Rfl:    traZODone  (DESYREL ) 50 MG tablet, TAKE 1 TABLET(50 MG) BY MOUTH AT BEDTIME, Disp: 90 tablet, Rfl: 1   vitamin B-12 (CYANOCOBALAMIN ) 1000 MCG tablet, Take 1,000 mcg by mouth daily., Disp: , Rfl:    vitamin E 1000 UNIT capsule, Take 1,000 Units by mouth 2 (two) times daily at 8 am and 10 pm., Disp: , Rfl:    zolpidem  (AMBIEN ) 5 MG tablet, TAKE 1 TABLET(5 MG) BY MOUTH AT BEDTIME, Disp: 30 tablet, Rfl: 0 No current facility-administered medications for this visit.  Facility-Administered Medications Ordered in Other Visits:    regadenoson  (LEXISCAN ) injection SOLN 0.4 mg, 0.4 mg, Intravenous, Once, Tobb, Kardie, DO   technetium tetrofosmin  (TC-MYOVIEW ) injection 31.7 millicurie, 31.7 millicurie, Intravenous, Once PRN, Tobb, Kardie, DO Medication Side Effects: none  Family Medical/ Social History: Changes? No  MENTAL HEALTH EXAM:  There were no vitals taken for this visit.There is no height or weight on file to calculate BMI.  General Appearance: Casual and Oxygen  Concentrator  Eye Contact:  Good  Speech:  Clear and Coherent  Volume:  Normal  Mood:  Anxious and Irritable  Affect:  Appropriate  Thought Process:  Normal  Orientation:  Full (Time,  Place, and Person)  Thought Content: Logical   Suicidal Thoughts:  No  Homicidal Thoughts:  No  Memory:  WNL  Judgement:  Good  Insight:  Good  Psychomotor Activity:  Normal  Concentration:  Concentration: Good  Recall:  Good  Fund of Knowledge: Good  Language: Good  Assets:  Desire for Improvement  ADL's:  Intact  Cognition: WNL  Prognosis:  Good    DIAGNOSES:    ICD-10-CM   1. Generalized anxiety disorder  F41.1 buPROPion  (WELLBUTRIN  XL) 150 MG 24 hr tablet    busPIRone  (BUSPAR ) 15 MG tablet    LORazepam  (ATIVAN ) 0.5 MG tablet    sertraline  (ZOLOFT ) 100 MG tablet    2. Agitation  R45.1 lamoTRIgine  (LAMICTAL )  150 MG tablet    buPROPion  (WELLBUTRIN  XL) 150 MG 24 hr tablet    3. Unspecified mood (affective) disorder  F39 lamoTRIgine  (LAMICTAL ) 150 MG tablet    buPROPion  (WELLBUTRIN  XL) 150 MG 24 hr tablet    4. Insomnia, unspecified type  G47.00 traZODone  (DESYREL ) 50 MG tablet    5. Depressive disorder  F32.A buPROPion  (WELLBUTRIN  XL) 150 MG 24 hr tablet    sertraline  (ZOLOFT ) 100 MG tablet      Receiving Psychotherapy: No    RECOMMENDATIONS:   Greater than 50% of 30 min face to face time with patient was spent on counseling and coordination of care. Discussed his recent mild decline with his moods. He would like to adjust his medication today.  Family is good.  Has strong family support from wife. She is helping him organize his medications.    We agreed to:   To continue Zoloft  to 200  mg daily To continue Trazodone  50 mg at bedtime To continue Buspar   to 15 mg three times daily To Increase Lamictal  to 150  mg. To continue Ativan  0.5 mg once daily prn only for severe anxiety or agitation. To continue Wellbutrin  to 150 mg XL daily Will report worsening symptoms or side effects promptly To follow up in 12 weeks per pt Provided emergency contact information Discussed potential benefits, risk, and side effects of benzodiazepines to include potential risk of  tolerance and dependence, as well as possible drowsiness.  Advised patient not to drive if experiencing drowsiness and to take lowest possible effective dose to minimize risk of dependence and tolerance.  Monitor for any sign of rash. Please taking Lamictal  and contact office immediately rash develops. Recommend seeking urgent medical attention if rash is severe and/or spreading quickly.  Reviewed PDMP    Redell DELENA Pizza, NP

## 2024-02-28 DIAGNOSIS — K080 Exfoliation of teeth due to systemic causes: Secondary | ICD-10-CM | POA: Diagnosis not present

## 2024-03-02 NOTE — Telephone Encounter (Signed)
 ATC x1.  Left detailed message for Jori per DPR to return call.

## 2024-03-05 ENCOUNTER — Other Ambulatory Visit: Payer: Self-pay | Admitting: Gastroenterology

## 2024-03-05 DIAGNOSIS — R1011 Right upper quadrant pain: Secondary | ICD-10-CM

## 2024-03-05 DIAGNOSIS — K2970 Gastritis, unspecified, without bleeding: Secondary | ICD-10-CM

## 2024-03-05 DIAGNOSIS — K219 Gastro-esophageal reflux disease without esophagitis: Secondary | ICD-10-CM

## 2024-03-08 DIAGNOSIS — A419 Sepsis, unspecified organism: Secondary | ICD-10-CM | POA: Diagnosis not present

## 2024-03-08 DIAGNOSIS — I10 Essential (primary) hypertension: Secondary | ICD-10-CM | POA: Diagnosis not present

## 2024-03-08 DIAGNOSIS — Z86711 Personal history of pulmonary embolism: Secondary | ICD-10-CM | POA: Diagnosis not present

## 2024-03-08 DIAGNOSIS — I2693 Single subsegmental pulmonary embolism without acute cor pulmonale: Secondary | ICD-10-CM | POA: Diagnosis not present

## 2024-03-14 ENCOUNTER — Ambulatory Visit: Payer: Self-pay | Admitting: Internal Medicine

## 2024-03-14 NOTE — Progress Notes (Signed)
 D-dimer blood test noormal in Oct 2025. If still normal at followup, we can look at moving to low dose eliquis  or baby aspirin 

## 2024-03-20 ENCOUNTER — Other Ambulatory Visit: Payer: Self-pay | Admitting: Behavioral Health

## 2024-03-20 ENCOUNTER — Telehealth: Payer: Self-pay | Admitting: Behavioral Health

## 2024-03-20 ENCOUNTER — Telehealth: Payer: Self-pay

## 2024-03-20 DIAGNOSIS — G4700 Insomnia, unspecified: Secondary | ICD-10-CM

## 2024-03-20 NOTE — Telephone Encounter (Signed)
 Called patient and he is just asking for a RF of Ambien .

## 2024-03-20 NOTE — Telephone Encounter (Signed)
 Addressed thru pharmacy interface.

## 2024-03-20 NOTE — Telephone Encounter (Signed)
 Patient lvm today requesting to speak with Redell or his nurse. He stated he needed to discuss some things with him. Appt scheduled for 08/27/24

## 2024-03-20 NOTE — Telephone Encounter (Signed)
"  ° °  Pre-operative Risk Assessment    Patient Name: Robert Lang  DOB: 1950/05/20 MRN: [id number]   Date of last office visit: 07/27/2023 Date of next office visit: N/A   Request for Surgical Clearance    Procedure:  Epidural steroid injection   Date of Surgery:  Clearance TBD                                Surgeon:  Caldwell Fickle Surgeon's Group or Practice Name:  [facility] Phone number:  [phone] Fax number:  [phone]   Type of Clearance Requested:   - Pharmacy:  Hold Apixaban  (Eliquis ) 3 days prior    Type of Anesthesia:  Not Indicated   Additional requests/questions:    Robert Lang   03/20/2024, 2:29 PM   "

## 2024-03-28 ENCOUNTER — Other Ambulatory Visit: Payer: Self-pay

## 2024-03-28 MED ORDER — RANOLAZINE ER 1000 MG PO TB12: 1000.0000 mg | ORAL_TABLET | Freq: Two times a day (BID) | ORAL | 0 refills | Status: AC

## 2024-03-29 NOTE — Telephone Encounter (Signed)
 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    Procedure: Epidural steroid injection  Date of procedure: TBD   CHA2DS2-VASc Score = 3   This indicates a 3.2% annual risk of stroke. 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    **This guidance is not considered finalized until pre-operative APP has relayed final recommendations.**

## 2024-03-29 NOTE — Telephone Encounter (Signed)
"  ° °  Patient Name: Robert Lang  DOB: [birth year] MRN: [id number]  Primary Cardiologist: Lamar Fitch, MD  Clinical pharmacists have reviewed the patient's past medical history, labs, and current medications as part of preoperative protocol coverage. 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     Procedure: Epidural steroid injection  Date of procedure: TBD     CHA2DS2-VASc Score = 3   This indicates a 3.2% annual risk of stroke. 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  Please resume Eliquis  as soon as possible postprocedure, at the discretion of the surgeon.    I will route this recommendation to the requesting party via Epic fax function and remove from pre-op pool.  Please call with questions.  Damien JAYSON Braver, NP 03/29/2024, 1:16 PM  "

## 2024-04-06 NOTE — Telephone Encounter (Signed)
 [facility] calling back in for update. Fax: [phone].

## 2024-04-06 NOTE — Telephone Encounter (Signed)
 Clearance recommendations faxed back to [phone].

## 2024-04-11 ENCOUNTER — Telehealth: Payer: Self-pay

## 2024-04-11 ENCOUNTER — Telehealth: Payer: Self-pay | Admitting: Medical

## 2024-04-11 NOTE — Telephone Encounter (Signed)
 Called pt wife back and informed her that I went ahead and faxed and also e mailed the [employer] forms with confirmation received
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
## 2024-04-13 NOTE — Telephone Encounter (Signed)
 Office called to say they still haven't received the fax. Please advise

## 2024-04-13 NOTE — Telephone Encounter (Signed)
 Clearance recommendations have now been routed to requesting office.

## 2024-04-23 ENCOUNTER — Other Ambulatory Visit: Payer: Self-pay | Admitting: Behavioral Health

## 2024-04-23 DIAGNOSIS — F411 Generalized anxiety disorder: Secondary | ICD-10-CM

## 2024-04-24 ENCOUNTER — Ambulatory Visit: Payer: Medicare Other | Admitting: Internal Medicine

## 2024-04-25 ENCOUNTER — Other Ambulatory Visit: Payer: Self-pay | Admitting: Neurology

## 2024-04-26 ENCOUNTER — Ambulatory Visit
Admission: RE | Admit: 2024-04-26 | Discharge: 2024-04-26 | Disposition: A | Source: Ambulatory Visit | Attending: Internal Medicine | Admitting: Internal Medicine

## 2024-04-26 DIAGNOSIS — Z86711 Personal history of pulmonary embolism: Secondary | ICD-10-CM

## 2024-04-26 DIAGNOSIS — J84112 Idiopathic pulmonary fibrosis: Secondary | ICD-10-CM

## 2024-04-26 DIAGNOSIS — R0609 Other forms of dyspnea: Secondary | ICD-10-CM

## 2024-05-01 ENCOUNTER — Encounter: Admitting: Pulmonary Disease

## 2024-05-09 ENCOUNTER — Encounter

## 2024-05-09 ENCOUNTER — Ambulatory Visit: Admitting: Internal Medicine

## 2024-05-18 ENCOUNTER — Encounter

## 2024-05-18 ENCOUNTER — Ambulatory Visit: Admitting: Internal Medicine

## 2024-07-25 ENCOUNTER — Ambulatory Visit: Admitting: Medical

## 2024-08-08 ENCOUNTER — Ambulatory Visit: Admitting: Neurology

## 2024-08-27 ENCOUNTER — Ambulatory Visit: Admitting: Behavioral Health

## 2025-01-08 ENCOUNTER — Ambulatory Visit
